# Patient Record
Sex: Male | Born: 1935 | Race: White | Hispanic: No | Marital: Married | Smoking: Never smoker
Health system: Southern US, Community
[De-identification: ages and names within clinical notes are randomized; demographics above are authoritative.]

## PROBLEM LIST (undated history)

## (undated) DIAGNOSIS — K659 Peritonitis, unspecified: Secondary | ICD-10-CM

## (undated) DIAGNOSIS — I5032 Chronic diastolic (congestive) heart failure: Secondary | ICD-10-CM

## (undated) DIAGNOSIS — M1990 Unspecified osteoarthritis, unspecified site: Secondary | ICD-10-CM

## (undated) DIAGNOSIS — Z950 Presence of cardiac pacemaker: Secondary | ICD-10-CM

## (undated) DIAGNOSIS — E7800 Pure hypercholesterolemia, unspecified: Secondary | ICD-10-CM

## (undated) DIAGNOSIS — G4733 Obstructive sleep apnea (adult) (pediatric): Secondary | ICD-10-CM

## (undated) DIAGNOSIS — I4510 Unspecified right bundle-branch block: Secondary | ICD-10-CM

## (undated) DIAGNOSIS — I8390 Asymptomatic varicose veins of unspecified lower extremity: Secondary | ICD-10-CM

## (undated) DIAGNOSIS — I519 Heart disease, unspecified: Secondary | ICD-10-CM

## (undated) DIAGNOSIS — B019 Varicella without complication: Secondary | ICD-10-CM

## (undated) DIAGNOSIS — E119 Type 2 diabetes mellitus without complications: Secondary | ICD-10-CM

## (undated) DIAGNOSIS — K635 Polyp of colon: Secondary | ICD-10-CM

## (undated) DIAGNOSIS — I350 Nonrheumatic aortic (valve) stenosis: Secondary | ICD-10-CM

## (undated) DIAGNOSIS — A770 Spotted fever due to Rickettsia rickettsii: Secondary | ICD-10-CM

## (undated) DIAGNOSIS — J189 Pneumonia, unspecified organism: Secondary | ICD-10-CM

## (undated) DIAGNOSIS — I10 Essential (primary) hypertension: Secondary | ICD-10-CM

## (undated) DIAGNOSIS — Z952 Presence of prosthetic heart valve: Secondary | ICD-10-CM

## (undated) DIAGNOSIS — E785 Hyperlipidemia, unspecified: Secondary | ICD-10-CM

## (undated) DIAGNOSIS — I4891 Unspecified atrial fibrillation: Secondary | ICD-10-CM

## (undated) DIAGNOSIS — E669 Obesity, unspecified: Secondary | ICD-10-CM

## (undated) DIAGNOSIS — I2510 Atherosclerotic heart disease of native coronary artery without angina pectoris: Secondary | ICD-10-CM

## (undated) DIAGNOSIS — R0600 Dyspnea, unspecified: Secondary | ICD-10-CM

## (undated) DIAGNOSIS — D649 Anemia, unspecified: Secondary | ICD-10-CM

## (undated) HISTORY — DX: Obstructive sleep apnea (adult) (pediatric): G47.33

## (undated) HISTORY — DX: Essential (primary) hypertension: I10

## (undated) HISTORY — DX: Anemia, unspecified: D64.9

## (undated) HISTORY — PX: APPENDECTOMY: SHX54

## (undated) HISTORY — PX: COLECTOMY: SHX59

## (undated) HISTORY — DX: Type 2 diabetes mellitus without complications: E11.9

## (undated) HISTORY — PX: EYE SURGERY: SHX253

## (undated) HISTORY — DX: Atherosclerotic heart disease of native coronary artery without angina pectoris: I25.10

## (undated) HISTORY — PX: KNEE CARTILAGE SURGERY: SHX688

## (undated) HISTORY — DX: Unspecified osteoarthritis, unspecified site: M19.90

## (undated) HISTORY — DX: Polyp of colon: K63.5

## (undated) HISTORY — PX: CARDIAC SURGERY: SHX584

## (undated) HISTORY — DX: Nonrheumatic aortic (valve) stenosis: I35.0

## (undated) HISTORY — PX: VEIN SURGERY: SHX48

## (undated) HISTORY — PX: CARPAL TUNNEL RELEASE: SHX101

## (undated) HISTORY — DX: Asymptomatic varicose veins of unspecified lower extremity: I83.90

## (undated) HISTORY — DX: Hyperlipidemia, unspecified: E78.5

## (undated) HISTORY — DX: Pure hypercholesterolemia, unspecified: E78.00

## (undated) HISTORY — DX: Heart disease, unspecified: I51.9

## (undated) HISTORY — DX: Varicella without complication: B01.9

## (undated) HISTORY — DX: Chronic diastolic (congestive) heart failure: I50.32

## (undated) HISTORY — DX: Obesity, unspecified: E66.9

## (undated) HISTORY — PX: PARTIAL COLECTOMY: SHX5273

## (undated) HISTORY — PX: JOINT REPLACEMENT: SHX530

## (undated) HISTORY — DX: Unspecified atrial fibrillation: I48.91

## (undated) HISTORY — PX: KNEE SURGERY: SHX244

## (undated) HISTORY — PX: PACEMAKER IMPLANT: EP1218

## (undated) HISTORY — DX: Unspecified right bundle-branch block: I45.10

---
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
## 1978-09-06 HISTORY — PX: VEIN SURGERY: SHX48

## 1983-09-07 HISTORY — PX: APPENDECTOMY: SHX54

## 2004-09-24 ENCOUNTER — Encounter (INDEPENDENT_AMBULATORY_CARE_PROVIDER_SITE_OTHER): Payer: Self-pay | Admitting: *Deleted

## 2004-09-24 ENCOUNTER — Ambulatory Visit (HOSPITAL_COMMUNITY): Admission: RE | Admit: 2004-09-24 | Discharge: 2004-09-24 | Payer: Self-pay | Admitting: *Deleted

## 2007-06-06 ENCOUNTER — Ambulatory Visit (HOSPITAL_COMMUNITY): Admission: RE | Admit: 2007-06-06 | Discharge: 2007-06-06 | Payer: Self-pay | Admitting: *Deleted

## 2007-06-06 ENCOUNTER — Encounter (INDEPENDENT_AMBULATORY_CARE_PROVIDER_SITE_OTHER): Payer: Self-pay | Admitting: *Deleted

## 2007-09-15 ENCOUNTER — Inpatient Hospital Stay (HOSPITAL_COMMUNITY): Admission: RE | Admit: 2007-09-15 | Discharge: 2007-09-19 | Payer: Self-pay | Admitting: General Surgery

## 2007-09-15 ENCOUNTER — Encounter (INDEPENDENT_AMBULATORY_CARE_PROVIDER_SITE_OTHER): Payer: Self-pay | Admitting: General Surgery

## 2008-01-30 ENCOUNTER — Ambulatory Visit: Payer: Self-pay | Admitting: Internal Medicine

## 2008-01-31 ENCOUNTER — Inpatient Hospital Stay (HOSPITAL_COMMUNITY): Admission: EM | Admit: 2008-01-31 | Discharge: 2008-02-04 | Payer: Self-pay | Admitting: Emergency Medicine

## 2008-03-19 ENCOUNTER — Ambulatory Visit (HOSPITAL_COMMUNITY): Admission: RE | Admit: 2008-03-19 | Discharge: 2008-03-19 | Payer: Self-pay | Admitting: *Deleted

## 2008-03-19 ENCOUNTER — Encounter (INDEPENDENT_AMBULATORY_CARE_PROVIDER_SITE_OTHER): Payer: Self-pay | Admitting: *Deleted

## 2011-01-19 NOTE — Op Note (Signed)
Todd Romero, Todd Romero           ACCOUNT NO.:  [id number]   MEDICAL RECORD NO.:  [id number]          PATIENT TYPE:  INP   LOCATION:  0003                         FACILITY:  [facility]   PHYSICIAN:  Edsel Petrin. Dalbert Batman, M.D.DATE OF BIRTH:  09/20/1933   DATE OF PROCEDURE:  09/15/2007  DATE OF DISCHARGE:                               OPERATIVE REPORT   PREOPERATIVE DIAGNOSIS:  Villous adenoma of the cecum.   POSTOPERATIVE DIAGNOSIS:  Villous adenoma of the cecum.   OPERATION:  1. Laparoscopic-assisted right colectomy.  2. Extensive, complex lysis of adhesions requiring 60 minutes.   SURGEON:  Edsel Petrin. Dalbert Batman, M.D.   FIRST ASSISTANT:  Kathrin Penner, M.D.   OPERATIVE INDICATIONS:  This is a 77 year old white man who has a  history of colon polyps in the past.  He had a surveillance colonoscopy  recently which revealed a moderately large, sessile adenomatous polyp  near the appendiceal orifice extending down into the cecal tip.  Biopsy  showed a tubular adenoma.  He had some a couple of other small polyps  which were completely removed.  He was counseled as an outpatient  regarding colon resection.  The laparoscopic approach was outlined.  He  does have a history of perforated appendicitis and 1989, requiring two  drains and a 21-day hospital stay.   OPERATIVE FINDINGS:  The patient had a palpable soft polyp in the cecum.  We were able to get good margins proximally in the terminal ileum and  distally up in the proximal ascending colon on this.  The excess  adhesions were fairly extensive including complex omental adhesions to  the anterior abdominal wall, complex adhesions of the omentum and small  bowel and colon to each other requiring over 60 minutes to take apart to  identify the anatomy.  Otherwise, the liver looked fine.  Peritoneal  surface looked fine.   OPERATIVE TECHNIQUE:  Following induction of general endotracheal  anesthesia, a Foley catheter was placed.  The patient  was given  intravenous antibiotics.  The patient was identified as the correct  patient and the correct procedure.  Marcaine 0.5% with epinephrine was  used as a local infiltration anesthetic.  A vertically oriented incision  was made at the superior rim of the umbilicus.  The fascia was incised  in midline.  The abdominal cavity entered under direct vision.  A 10 mm  Hassan trocar was inserted and secured with the pursestring suture of 0  Vicryl.  Pneumoperitoneum was created.  The video camera was inserted  with visualization findings as described above.   Adhesions came right up to the umbilicus.  I was able to put a 5-mm  trocar in the left abdominal wall, a 5 mm trocar in the lower midline, a  5-mm trocar in the right abdominal wall, and 11-mm trocar in the upper  midline.  Using the Harmonic scalpel, blunt dissection and scissors  carefully took down all the omental adhesions from the anterior  abdominal wall.  I then dissected the omentum off of the mid and  terminal ileum.  I then took down all the adhesions between loops of  ileum.  I ultimately was able to identify the ileocecal valve and the  cecum.  Using the Harmonic scalpel and blunt dissection, I mobilized the  right colon, hepatic flexure from lateral to medial.  I kept the  duodenum identified and visualized.  Once I felt that I had good  mobilization, I made a transverse incision in the right mid abdomen  lateral to the umbilicus.  The rectus sheath and rectus muscle were  divided with electrocautery.  Wound protector was placed.  We brought  the colon out through this.  We had to take down some more adhesions,  but ultimately we could identify the terminal ileum, the ileocecal  valve, the cecum, the ascending colon, and the hepatic flexure.  We  transected the terminal ileum about 2-1/2 inches proximal to 1/2 inch  proximal to the ileocecal valve using a GIA stapling device.  We  transected the hepatic flexure using a  GIA stapling device.  Mesenteric  vessels were divided using the LigaSure device firing this multiple  times.  A couple of vessels were controlled with 2-0 silk suture  ligatures.  The specimen was removed and sent to the lab.  Dr. Brigitte Pulse took  a look at this and said that I had excellent margins proximally and a 10  cm margin distally.  She said that she could clearly identify the polyp  in the cecum.   Anastomosis was created between the terminal ileum and hepatic flexure  using the GIA stapling device.  There was no bleeding from staple line.  The defect in the bowel wall was closed with TA-60 stapling device.  Several sutures of 3-0 silk were used to reinforce the staple line at  critical points and to control a couple of bleeding points in the staple  line.  At this point we checked the anastomosis.  It  looked very good.  There was no area of weakness or leak.  The mesentery was closed with  interrupted sutures of 2-0 silk.   We then changed our gloves, instruments and suction devices.  We  returned the intestinal tract to the abdominal cavity.  We irrigated the  abdominal cavity with about 2 liters of saline and evacuated the fluid.  The posterior rectus sheath was closed with running suture of #1 PDS.  The anterior rectus sheath was closed with running suture of #1 PDS and  skin closed with skin staples.   We reinsufflated the abdomen.  We reinserted the video camera.  The  anastomosis looked good.  There was no bleeding anywhere.  We irrigated  out all the fluid and there did not seem to be any bleeding.  Irrigation  fluid became clear.  We released the pneumoperitoneum.  All the trocars  were removed.  The fascia at the umbilicus was closed with 0 Vicryl  sutures.  All the skin incisions were closed with skin staples.  Kling  bandages were placed and the patient taken recovery in stable condition.  Estimated blood loss was about 125 mL.  Complications none.  Sponges  counts  were correct.      Edsel Petrin. Dalbert Batman, M.D.  Electronically Signed     HMI/MEDQ  D:  09/15/2007  T:  09/15/2007  Job:  [id number]   cc:   Waverly Ferrari, M.D.  [phone]   Ronaldo Miyamoto, M.D.  Fax: [phone]

## 2011-01-19 NOTE — Op Note (Signed)
NAME:  Todd Romero, Todd Romero NO.:  [id number]   MEDICAL RECORD NO.:  [id number]          PATIENT TYPE:  AMB   LOCATION:  ENDO                         FACILITY:  [facility]   PHYSICIAN:  Waverly Ferrari, M.D.    DATE OF BIRTH:  Jul 07, 1934   DATE OF PROCEDURE:  03/19/2008  DATE OF DISCHARGE:                               OPERATIVE REPORT   PROCEDURE:  Upper endoscopy with biopsy.   INDICATIONS:  Duodenal ulcer with bleeding.   ANESTHESIA:  1. Fentanyl 50 mcg.  2. Versed 5 mg.   PROCEDURE:  With the patient mildly sedated in the left lateral  decubitus position, the Pentax videoscopic endoscope was inserted in the  mouth and passed under direct vision through the esophagus, which  appeared normal, except I could not see the distal esophagus very well,  so Barrett's could not be ruled out.  We entered into the stomach.  Fundus, body, antrum appeared normal.  Duodenal bulb was clear of all  previously noted ulcers, as was the second portion of the duodenum.  From this point, the endoscope was slowly withdrawn, taking  circumferential views of the duodenal mucosa, until the endoscope had  been pulled back into the stomach and placed in retroflexion to view the  stomach from below, and we were able to biopsy this area of the distal  esophagus where there was possibly Barrett's.  The endoscope was then  straightened and withdrawn, taking circumferential views of the  remaining gastric and esophageal mucosa.  The patient's vital signs and  pulse oximeter remained stable.  The patient tolerated the procedure  well, without apparent complications.   FINDINGS:  Question of Barrett's esophagus, otherwise an unremarkable  exam.  Await biopsy report.  The patient will call me for results and  follow up with me as an outpatient.           ______________________________  Waverly Ferrari, M.D.     GMO/MEDQ  D:  03/19/2008  T:  03/19/2008  Job:  [id number]

## 2011-01-19 NOTE — Op Note (Signed)
NAME:  Todd Romero, Todd Romero NO.:  [id number]   MEDICAL RECORD NO.:  [id number]          PATIENT TYPE:  AMB   LOCATION:  ENDO                         FACILITY:  [facility]   PHYSICIAN:  Waverly Ferrari, M.D.    DATE OF BIRTH:  03-04-1934   DATE OF PROCEDURE:  06/06/2007  DATE OF DISCHARGE:                               OPERATIVE REPORT   PROCEDURE:  Colonoscopy with biopsy and polypectomy.   ENDOSCOPIST:  Waverly Ferrari, M.D.   INDICATIONS:  Colon polyps.   ANESTHESIA:  Fentanyl 75 mcg, Versed 7.5 mg.   PROCEDURE:  With the patient mildly sedated in the left lateral  decubitus position, a rectal examination was performed, which was  unremarkable.  Subsequently, the Pentax videoscopic colonoscope was  inserted into the rectum and passed under direct vision to the cecum,  identified by ileocecal valve and appendiceal orifice, both of which  were photographed.  Adjacent to the appendiceal orifice and in fact part  of the fold, there appeared to be an enlarged area that I thought was an  adenomatous tissue.  I felt at this point that it was probably too large  to remove endoscopically safely, so I elected to photograph it and take  multiple biopsies of the area.  From this point, the colonoscope was  then slowly withdrawn, taking circumferential views of colonic mucosa,  stopping next in the descending colon at approximately 60 cm from anal  verge, at which point a second polyp, much smaller, probably 3-4 mm in  size, was seen, photographed and removed using hot biopsy forceps  technique at a setting of 20/150 blended current.  We next stopped in  the rectum, which showed a polyp that was approximately 5 mm in size and  it was removed using snare cautery technique with the same setting.  Tissue was suctioned and obtained for Pathology.  The endoscope was  placed in a retroflexed view to view the anal canal from above.  The  endoscope was then straightened and withdrawn.  The  patient's vital  signs and pulse oximetry remained stable.  The patient tolerated the  procedure well without apparent complications.   FINDINGS:  Polyp of rectum at 60 cm from the anal verge and question of  polyp in the cecum, await biopsy reports.  The patient will call me for  results and follow up with me as an outpatient.           ______________________________  Waverly Ferrari, M.D.     GMO/MEDQ  D:  06/06/2007  T:  06/06/2007  Job:  [id number]

## 2011-01-19 NOTE — Discharge Summary (Signed)
NAME:  Todd Romero, Todd Romero           ACCOUNT NO.:  [id number]   MEDICAL RECORD NO.:  [id number]          PATIENT TYPE:  INP   LOCATION:  [place]                         FACILITY:  [facility]   PHYSICIAN:  [name], M.D.       DATE OF BIRTH:  03-Jun-1934   DATE OF ADMISSION:  01/30/2008  DATE OF DISCHARGE:  02/04/2008                               DISCHARGE SUMMARY   HISTORY:  The patient was admitted with fever for about 5 days with mild  chills, with temperature being persistent up about 101 degrees.  He did  not have any significant systemic symptoms but was getting more weak and  lethargic as well as, according to his wife, somewhat confused and  disoriented.   PHYSICAL EXAMINATION:  The patient had a blood pressure of 156/72,  temperature 101.8.  His mucous membranes were dry.  The rest of the exam  was unremarkable.   HOSPITAL COURSE:  The patient was seen by Dr. Megan Salon in consultation  for fever who felt that he had a faint, nonblanching macular rash on his  feet and just above his elbows and started him on doxycycline  empirically.  Subsequently, the [facility] spotted fever test came  back positive and he was continued on doxycycline.  The patient's  temperature peaked at about 103 degrees on May 27, but subsequently  started improving.  The patient's mental status also slowly was  improving.   On May 27 the patient had a bloody and tarry bowel movement and had a  near-syncopal episode with his blood pressure dropping to 96 systolic.  The patient was seen in consultation by Dr. Jim Desanctis who felt that he  had upper GI bleeding and he started him on Protonix infusion and  transfused him 2 units.   His endoscopy by Dr. Lajoyce Corners the next day showed a bleeding duodenal ulcer  which was actively bleeding.  This was not clearly seen because of the  blood overlying it and epinephrine 2 mL was injected in the base.  This  apparently stopped the bleeding at the time of endoscopy and the  patient  did not have any further melena or episodes of hypotension.   The patient had no significant fever on May 29 and May 30.  His white  blood cell count also came down to 10.5 after a peak level of about  15,000 on May 28.  The patient resumed his normal diet.  His blood  pressure medications were withheld.  Also, because of the patient's  marked hyperglycemia after the first day of admission, the patient was  given increasing doses of insulin with maximum of 44 Lantus a day which  was reduced on May 30 down to 24, with which his blood sugars remained  quite normal.  The patient's metformin was also withheld initially but  was resumed subsequently.   SIGNIFICANT LABORATORIES:  RMSF titer 1.37, normal less than 0.89.  His  lowest hemoglobin level was around 9.2.  Chemistry showed sodium of 127  on admission and was normal to 136 upon discharge.  CT scan of his brain  on admission  showed no acute findings.  Chest x-ray was also  unremarkable except mild bronchitic changes.   DISCHARGE DIAGNOSES:  1. [facility] spotted fever secondary to a tick bite.  2. Acute bleeding duodenal ulcer.  3. Hyponatremia.  4. Hyperglycemia secondary to diabetes.  5. Mild encephalopathy secondary to [facility] spotted fever.  6. History of hypertension, obesity, hyperlipidemia and colon      neoplasm.   DISCHARGE CONDITION:  Significantly improved.   DISCHARGE INSTRUCTIONS:  The patient will resume his diabetic diet.  He  will continue his home medications including Actos, metformin, Amaryl,  Lipitor, Enablex, Flomax, but will not take any Hyzaar.  He will also  not take any aspirin or Advil-type products.  He will continue to check  his blood pressure and blood sugar at home regularly and follow up in  the office in 1 week.  He will also be started on OTC iron twice a day.      [name], M.D.  Electronically Signed     AK/MEDQ  D:  02/04/2008  T:  02/04/2008  Job:  [id number]    cc:   Waverly Ferrari, M.D.  Fax: [phone]   Michel Bickers, M.D.  Fax: [phone]

## 2011-01-19 NOTE — H&P (Signed)
NAME:  Todd Romero, Todd Romero           ACCOUNT NO.:  [id number]   MEDICAL RECORD NO.:  [id number]          PATIENT TYPE:  OBV   LOCATION:  0104                         FACILITY:  [facility]   PHYSICIAN:  [name], M.D.       DATE OF BIRTH:  12/06/1933   DATE OF ADMISSION:  01/30/2008  DATE OF DISCHARGE:                              HISTORY & PHYSICAL   CHIEF COMPLAINT:  Fever.   HISTORY:  This 77 year old Caucasian male has had fever since about last  Thursday.  The patient does not have any associated chills, but the  temperature has been about 101 or so at home.  He was evaluated in the  [facility] on Saturday without any etiology being found.  The patient  has also been somewhat disoriented and less responsive in the last 3  days.  There is a history of mild cough, but the patient does not  complain of this right now.  There are no significant headaches, sore  throat, no hemoptysis.  There is no history of abdominal pain.  No  change in bowel movements.  He does not think he had any burning or  discomfort with urination, although he has some urgency.  He apparently  has been getting much more weak in the last few days and requires help  with getting up and down.  He is also having markedly decreased appetite  without any nausea or vomiting.   CURRENT MEDICATIONS:  1. He is on insulin 20 units h.s.  2. Amaryl 2 mg.  3. Actos 45.  4. Metformin 1 gm b.i.d.  5. Hyzaar 100/25.  6. Lipitor 40 mg.  7. Enablex 15 mg.  8. Flomax 0.4 mg.  9. Fish oil.   ALLERGIES:  NONE.   PAST HISTORY:  1. Hemicolectomy for precancerous polyps.  2. Varicose vein surgery.   FAMILY HISTORY:  Noncontributory.   PERSONAL HISTORY:  Nonsmoker.   REVIEW OF SYSTEMS:  He has had diabetes since 1997 which is fairly well  controlled and his last A1c of 6.7%. He has had hypertension for about 2  years.  He has a history of BPH and lower urinary tract symptoms  followed by urologist.  He has had regular  followups for his GI  monitoring.   PHYSICAL EXAMINATION:  VITAL SIGNS:  Pulse is 70, blood pressure 156/72,  temperature 101.8, respirations normal.  HEENT:  He has equal pupils and normal ocular movements. Mucous  membranes are dry.  NECK:  Shows no lymphadenopathy.  No thyroid enlargement.  He has no  neck stiffness.  HEART:  Sounds are normal without murmurs.  LUNGS:  Clear.  ABDOMEN:  No distention or tenderness.  RECTAL:  Showed no tenderness of the prostate.  EXTREMITIES:  Show no foot infection nor toe ulcers.  SKIN:  Shows no rash   ASSESSMENT:  The patient has fever of unknown origin with mild  leukocytosis of 12,000.  He also got some encephalopathy developing.  Currently, he also has a mild decrease in sodium which could be  responsible for his mental status change.   PLAN:  Would be  to get ID consult and give supportive treatment and  hydration.  His [facility] spotted fever titer has also been sent  and pending.      [name], M.D.  Electronically Signed     AK/MEDQ  D:  01/30/2008  T:  01/30/2008  Job:  [id number]

## 2011-01-19 NOTE — Op Note (Signed)
NAME:  Todd Romero, Todd Romero NO.:  [id number]   MEDICAL RECORD NO.:  [id number]          PATIENT TYPE:  INP   LOCATION:  1410                         [facility]ACILITY:  [facility]   PHYSICIAN:  Waverly Ferrari, M.D.    DATE OF BIRTH:  1933/12/15   DATE OF PROCEDURE:  DATE OF DISCHARGE:                               OPERATIVE REPORT   PROCEDURE:  Upper endoscopy with injection of bleeding ulcer.   ANESTHESIA:  Fentanyl 50 mcg, Versed 5 mg.   INDICATIONS:  The patient is passing black stools with blood per rectum  that started on Jan 31, 2008.   DESCRIPTION OF PROCEDURE:  With the patient mildly sedated in the left  lateral decubitus position in room 2 of Endoscopy at [facility], the Pentax videoscopic endoscope was inserted in the mouth and  passed under direct vision through the esophagus, which showed some  superficial ulcerations in the esophagus, presumably secondary to acid  reflux.  We were able to advance past it, but there was no active  bleeding seen from this site.  We advanced past this into the stomach  and bright red blood was seen in the fundus of the stomach, pooling.  We  advanced distally to this.  The body and antrum appeared normal, but we  could see blood emanating back from the duodenum.  We advanced into the  duodenum and there was a fairly copious amount of bright red blood in  the duodenum and distally, but without as far as an active bleeding site  visible.  Although we could see blood spurting underneath the pool of  blood, we could not find the lesion.  We suctioned, washed and suctioned  subsequently and there appeared to be a temporary cessation of bleeding,  at which point on slow pull back from distal to proximal at the apex of  the duodenal bulb, I found a deepened area which I presume was an ulcer.  I could not see the base because there was a blood clot overlying it,  and I elected then to inject 2 mL of epinephrine into the base of the  ulcer and noted good blanching surrounding this area.  I then pulled  back slightly further and injected 1 mL proximal to this ulcer with  further blanching.  Once I had been satisfied that the bleeding seemed  to have stopped, I withdrew the endoscope, taking circumferential views  of the remaining gastric and esophageal mucosa.  The patient's vital  signs and pulse oximeter remained stable.  The patient tolerated the  procedure well without apparent complications.   FINDINGS:  1. Acid reflux ulcerations of the esophagus, not a bleeding site.  2. Deep ulcer at the apex of the duodenal bulb appeared to be active      bleeding site, injected with epinephrine.   PLAN:  Continue to monitor the patient and transfuse as necessary.  Continue Protonix 8 mg IV continuous infusion.  Check an H. pylori  antibody.           ______________________________  Waverly Ferrari, M.D.     GMO/MEDQ  D:  02/01/2008  T:  02/01/2008  Job:  [id number]   cc:   [name], M.D.  Fax: [phone]   Parke Poisson. Electa Sniff, M.D.  Fax: [phone]

## 2011-01-22 NOTE — Op Note (Signed)
NAME:  Todd Romero, CESAR NO.:  [id number]   MEDICAL RECORD NO.:  [id number]          PATIENT TYPE:  AMB   LOCATION:  ENDO                         FACILITY:  [place]   PHYSICIAN:  Waverly Ferrari, M.D.    DATE OF BIRTH:  September 29, 1933   DATE OF PROCEDURE:  09/24/2004  DATE OF DISCHARGE:                                 OPERATIVE REPORT   PROCEDURE:  Upper endoscopy.   INDICATIONS:  __________ positivity.   ANESTHESIA:  1.  Demerol 40.  2. Versed 6 mg.   PROCEDURE:  With the patient mildly sedated in the left lateral decubitus  position, the Olympus videoscopic endoscope was inserted in the mouth,  advanced under direct vision to the esophagus which appeared normal until we  reached the distal esophagus.  There was ________ linear erosion ________  palpable reflux which was at first photographed.  The stomach, fundus was  reviewed and photographed.  We pulled back his distal esophagus.  There  appeared to be a change of Barrett's, esophagus photographed and biopsied.  We then advanced into the stomach.  The fundus, body, antrum, duodenal bulb,  second portion of the duodenum all appeared normal throughout.  At this  point the endoscope was slowly withdrawn, taking circumferential views of  the duodenum.  With no difficulty, the endoscope was then pulled back in the  stomach __________ retroflexion from below.  The endoscope was straightened  and withdrawn, taking circumferential views of the remaining gastric and  esophageal mucosa _________ biopsy the area of linear erosion _________  patient's vital signs were stable.  Patient tolerated the procedure well  without any apparent complications.   FINDINGS:  Question of Barrett's esophagus and mild esophagitis, biopsied;  await biopsy report.  Patient to call for results and followup with me as an  outpatient and proceed with colonoscopy as planned.      GMO/MEDQ  D:  09/24/2004  T:  09/24/2004  Job:  [id number]

## 2011-01-22 NOTE — Discharge Summary (Signed)
[name], Todd Romero           ACCOUNT NO.:  [id number]   MEDICAL RECORD NO.:  [id number]          PATIENT TYPE:  INP   LOCATION:  [place]:  [place]:  Edsel Petrin. Dalbert Batman, M.D.DATE OF BIRTH:  [birth year]   DATE OF ADMISSION:  09/15/2007  DATE OF DISCHARGE:  09/19/2007                               DISCHARGE SUMMARY   FINAL DIAGNOSIS:  1. Villous adenoma of the cecum.  2. diabetes mellitus.  3. Hypertension.  4. Hyperlipidemia.  5. Obesity.  6. Complex intra-abdominal adhesions related to past history of      ruptured appendicitis.   OPERATION PERFORMED:  1. Laparoscopic-assisted right colectomy.  2. Laparoscopic lysis of adhesions   This is a 77 year old white man who has history of colon polyps.  Recent  surveillance colonoscopy revealed a sessile, poorly differentiated  adenomatous polyp near the appendiceal orifice in the cecum which was  too large to remove endoscopically.  The biopsy showed tubular adenoma.  He had some other smaller polyps which were completely removed.  He was  referred for segmental colon resection because of his unresectable  villous adenoma of the cecum.  He was counseled as an outpatient.  He  underwent bowel prep at home and was brought to the operating room  electively.   HOSPITAL COURSE:  On day of admission, the patient underwent  laparoscopic-assisted right colectomy.  He had fairly complex adhesions  which took over 1 hour to take down.  We then ultimately were able to  accomplish the laparoscopic-assisted right colectomy.  Final pathology  report showed tubulovillous adenoma and three benign lymph nodes and no  invasive malignancy identified.   Postoperatively, the patient did well.  We started his Flomax and took  his Foley out on January 11 and he voided uneventfully thereafter.  His  diabetes was controlled with sliding scale insulin and as he resumed  diet, we restarted his Actos and Amaryl and  metformin as the diet  advanced.  His hypertension was well-controlled on his medications.  He  advanced his diet without too much trouble.  He began having bowel  movements on January 12 and we advanced his diet and he continued to  have bowel movements and was ready to go home on January 13.  At time,  he was tolerating regular diet, having bowel movements.  His wounds  looked good, blood sugars were under control, blood pressure was under  control and he felt well.   DISCHARGE MEDICATIONS:  Include his usual medications and Vicodin ES for  pain.  He was asked to return see me in the office in 5 days for staple  removal.      Edsel Petrin. Dalbert Batman, M.D.  Electronically Signed     HMI/MEDQ  D:  09/29/2007  T:  09/29/2007  Job:  [id number]   cc:   Waverly Ferrari, M.D.  [phone]   Ronaldo Miyamoto, M.D.  Fax: [phone]

## 2011-01-22 NOTE — Op Note (Signed)
NAME:  RODIN, PULLIUM NO.:  [id number]   MEDICAL RECORD NO.:  [id number]          PATIENT TYPE:  AMB   LOCATION:  ENDO                         FACILITY:  [facility]   PHYSICIAN:  Waverly Ferrari, M.D.    DATE OF BIRTH:  1933-10-25   DATE OF PROCEDURE:  09/24/2004  DATE OF DISCHARGE:                                 OPERATIVE REPORT   PROCEDURE:  Colonoscopy.   INDICATIONS:  Colon polyps __________.   ANESTHESIA:  1.  Demerol 20.  2. Versed 1 mg.   PROCEDURE:  With the patient mildly sedated in the left lateral decubitus  position, a rectal examination was performed which was unremarkable  __________.  The Olympus videoscopic colonoscope was inserted in the rectum  and passed under direct vision, with pressure applied over the abdomen to  reach the cecum, identified by the ileocecal valve and appendiceal orifice.  Near the appendiceal orifice was a polyp that was photographed.  Using the  snare cautery technique, __________ there was a second polyp here, this was  ablated, was very tiny.  Using hot biopsy forceps at the same setting, we  next removed a polyp by hot biopsy forceps in the other area of the cecum.  The endoscope was then withdrawn, taking circumferential views of the  remaining colonic mucosa, stopping only in the rectum which appeared normal.  The rectum showed small hemorrhoids on retroflexed view.  The scope was  straightened and withdrawn.  The patient's vital signs and pulse oximetry  remained in stable condition.  He tolerated the procedure well without  apparent complications.   FINDINGS:  Polyps in cecum as described.  Await biopsy report.  Patient will  call me for results and followup with me as an outpatient.      GMO/MEDQ  D:  09/24/2004  T:  09/24/2004  Job:  [id number]

## 2011-05-27 LAB — BASIC METABOLIC PANEL
BUN: 7
CO2: 24
GFR calc non Af Amer: >60
GFR calc non Af Amer: >60
Glucose, Bld: 125 — ABNORMAL HIGH
Glucose, Bld: 164 — ABNORMAL HIGH
Potassium: 3.5
Potassium: 3.8
Sodium: 129 — ABNORMAL LOW

## 2011-05-27 LAB — CBC
HCT: 33.6 — ABNORMAL LOW
HCT: 38.2 — ABNORMAL LOW
Hemoglobin: 11.5 — ABNORMAL LOW
MCHC: 34.2
Platelets: 197
RBC: 4.2 — ABNORMAL LOW
RDW: 14.9
WBC: 7.7

## 2011-05-27 LAB — COMPREHENSIVE METABOLIC PANEL
AST: 20
Albumin: 3.8
Alkaline Phosphatase: 54
BUN: 21
CO2: 25
Chloride: 105
GFR calc Af Amer: >60
Potassium: 4.1
Total Bilirubin: 0.8

## 2011-05-27 LAB — URINALYSIS, ROUTINE W REFLEX MICROSCOPIC
Bilirubin Urine: NEGATIVE
Glucose, UA: NEGATIVE
Hgb urine dipstick: NEGATIVE
Ketones, ur: NEGATIVE
pH: 6.5

## 2011-05-27 LAB — DIFFERENTIAL
Basophils Absolute: 0
Basophils Relative: 0
Eosinophils Relative: 2
Monocytes Absolute: 0.8

## 2011-06-02 LAB — DIFFERENTIAL
Basophils Absolute: 0
Basophils Absolute: 0
Basophils Absolute: 0.1
Basophils Relative: 0
Basophils Relative: 0
Basophils Relative: 1
Eosinophils Absolute: 0
Eosinophils Absolute: 0
Eosinophils Absolute: 0.2
Eosinophils Absolute: 0.3
Eosinophils Relative: 0
Eosinophils Relative: 0
Lymphocytes Relative: 14
Lymphocytes Relative: 20
Lymphocytes Relative: 9 — ABNORMAL LOW
Lymphs Abs: 2.7
Monocytes Absolute: 1
Monocytes Relative: 8
Monocytes Relative: 9
Neutro Abs: 9.4 — ABNORMAL HIGH
Neutrophils Relative %: 70

## 2011-06-02 LAB — COMPREHENSIVE METABOLIC PANEL
ALT: 27
ALT: 29
AST: 38 — ABNORMAL HIGH
Albumin: 2.2 — ABNORMAL LOW
Albumin: 2.2 — ABNORMAL LOW
Alkaline Phosphatase: 36 — ABNORMAL LOW
Alkaline Phosphatase: 40
BUN: 12
BUN: 23
BUN: 23
CO2: 24
CO2: 25
Calcium: 8.2 — ABNORMAL LOW
Calcium: 8.5
Calcium: 8.5
Chloride: 104
Creatinine, Ser: 0.82
Creatinine, Ser: 0.96
GFR calc Af Amer: >60
GFR calc non Af Amer: >60
GFR calc non Af Amer: >60
Glucose, Bld: 177 — ABNORMAL HIGH
Glucose, Bld: 76
Potassium: 3.7
Potassium: 3.9
Sodium: 133 — ABNORMAL LOW
Sodium: 134 — ABNORMAL LOW
Sodium: 134 — ABNORMAL LOW
Total Bilirubin: 0.6
Total Bilirubin: 0.6
Total Protein: 4.5 — ABNORMAL LOW
Total Protein: 4.6 — ABNORMAL LOW
Total Protein: 4.7 — ABNORMAL LOW

## 2011-06-02 LAB — URINALYSIS, ROUTINE W REFLEX MICROSCOPIC
Ketones, ur: 15 — AB
Leukocytes, UA: NEGATIVE
Nitrite: NEGATIVE
Protein, ur: 30 — AB
Urobilinogen, UA: 1

## 2011-06-02 LAB — CBC
HCT: 27.2 — ABNORMAL LOW
HCT: 27.7 — ABNORMAL LOW
HCT: 28.3 — ABNORMAL LOW
HCT: 30.2 — ABNORMAL LOW
HCT: 31.3 — ABNORMAL LOW
HCT: 33 — ABNORMAL LOW
Hemoglobin: 9.5 — ABNORMAL LOW
Hemoglobin: 9.6 — ABNORMAL LOW
MCHC: 34.3
MCHC: 34.6
MCHC: 34.6
MCHC: 34.9
MCV: 87.6
MCV: 88
MCV: 88.2
Platelets: 140 — ABNORMAL LOW
Platelets: 154
Platelets: 163
Platelets: 166
Platelets: 198
RBC: 3.14 — ABNORMAL LOW
RBC: 3.55 — ABNORMAL LOW
RDW: 13.5
RDW: 13.7
RDW: 14.2
RDW: 14.4
RDW: 14.5
RDW: 14.7
WBC: 12.5 — ABNORMAL HIGH

## 2011-06-02 LAB — POCT I-STAT, CHEM 8
BUN: 19
Calcium, Ion: 1.14
Chloride: 92 — ABNORMAL LOW
Glucose, Bld: 212 — ABNORMAL HIGH

## 2011-06-02 LAB — BASIC METABOLIC PANEL
BUN: 12
Calcium: 8.5
Creatinine, Ser: 0.83
GFR calc non Af Amer: >60
Glucose, Bld: 86

## 2011-06-02 LAB — CULTURE, BLOOD (ROUTINE X 2): Culture: NO GROWTH

## 2011-06-02 LAB — CROSSMATCH: ABO/RH(D): A POS

## 2011-06-02 LAB — PROTIME-INR
INR: 1.2
Prothrombin Time: 15.2

## 2011-06-02 LAB — HEMOGLOBIN AND HEMATOCRIT, BLOOD
HCT: 28.6 — ABNORMAL LOW
HCT: 28.9 — ABNORMAL LOW
Hemoglobin: 9.5 — ABNORMAL LOW
Hemoglobin: 9.7 — ABNORMAL LOW
Hemoglobin: 9.9 — ABNORMAL LOW

## 2011-06-02 LAB — H. PYLORI ANTIBODY, IGG: H Pylori IgG: <0.4

## 2011-11-16 DIAGNOSIS — H43819 Vitreous degeneration, unspecified eye: Secondary | ICD-10-CM | POA: Diagnosis not present

## 2011-11-16 DIAGNOSIS — H33312 Horseshoe tear of retina without detachment, left eye: Secondary | ICD-10-CM | POA: Insufficient documentation

## 2011-11-16 DIAGNOSIS — H33009 Unspecified retinal detachment with retinal break, unspecified eye: Secondary | ICD-10-CM | POA: Diagnosis not present

## 2011-12-14 DIAGNOSIS — D649 Anemia, unspecified: Secondary | ICD-10-CM | POA: Diagnosis not present

## 2011-12-14 DIAGNOSIS — IMO0001 Reserved for inherently not codable concepts without codable children: Secondary | ICD-10-CM | POA: Diagnosis not present

## 2011-12-14 DIAGNOSIS — E7800 Pure hypercholesterolemia, unspecified: Secondary | ICD-10-CM | POA: Diagnosis not present

## 2011-12-20 DIAGNOSIS — D649 Anemia, unspecified: Secondary | ICD-10-CM | POA: Diagnosis not present

## 2011-12-20 DIAGNOSIS — I10 Essential (primary) hypertension: Secondary | ICD-10-CM | POA: Diagnosis not present

## 2011-12-20 DIAGNOSIS — E785 Hyperlipidemia, unspecified: Secondary | ICD-10-CM | POA: Diagnosis not present

## 2012-01-07 DIAGNOSIS — H35039 Hypertensive retinopathy, unspecified eye: Secondary | ICD-10-CM | POA: Diagnosis not present

## 2012-01-07 DIAGNOSIS — H524 Presbyopia: Secondary | ICD-10-CM | POA: Diagnosis not present

## 2012-01-07 DIAGNOSIS — H353190 Nonexudative age-related macular degeneration, unspecified eye, stage unspecified: Secondary | ICD-10-CM | POA: Diagnosis not present

## 2012-01-07 DIAGNOSIS — H40019 Open angle with borderline findings, low risk, unspecified eye: Secondary | ICD-10-CM | POA: Diagnosis not present

## 2012-01-07 DIAGNOSIS — D492 Neoplasm of unspecified behavior of bone, soft tissue, and skin: Secondary | ICD-10-CM | POA: Diagnosis not present

## 2012-02-24 DIAGNOSIS — Z86010 Personal history of colonic polyps: Secondary | ICD-10-CM | POA: Diagnosis not present

## 2012-02-24 DIAGNOSIS — K219 Gastro-esophageal reflux disease without esophagitis: Secondary | ICD-10-CM | POA: Diagnosis not present

## 2012-02-24 DIAGNOSIS — Z1211 Encounter for screening for malignant neoplasm of colon: Secondary | ICD-10-CM | POA: Diagnosis not present

## 2012-02-24 DIAGNOSIS — D509 Iron deficiency anemia, unspecified: Secondary | ICD-10-CM | POA: Diagnosis not present

## 2012-03-02 DIAGNOSIS — H2510 Age-related nuclear cataract, unspecified eye: Secondary | ICD-10-CM | POA: Diagnosis not present

## 2012-03-02 DIAGNOSIS — H43399 Other vitreous opacities, unspecified eye: Secondary | ICD-10-CM | POA: Diagnosis not present

## 2012-03-02 DIAGNOSIS — H35039 Hypertensive retinopathy, unspecified eye: Secondary | ICD-10-CM | POA: Diagnosis not present

## 2012-03-02 DIAGNOSIS — H40019 Open angle with borderline findings, low risk, unspecified eye: Secondary | ICD-10-CM | POA: Diagnosis not present

## 2012-03-02 DIAGNOSIS — H353190 Nonexudative age-related macular degeneration, unspecified eye, stage unspecified: Secondary | ICD-10-CM | POA: Diagnosis not present

## 2012-03-07 DIAGNOSIS — M79609 Pain in unspecified limb: Secondary | ICD-10-CM | POA: Diagnosis not present

## 2012-03-07 DIAGNOSIS — M25539 Pain in unspecified wrist: Secondary | ICD-10-CM | POA: Diagnosis not present

## 2012-03-23 DIAGNOSIS — D649 Anemia, unspecified: Secondary | ICD-10-CM | POA: Diagnosis not present

## 2012-03-23 DIAGNOSIS — E785 Hyperlipidemia, unspecified: Secondary | ICD-10-CM | POA: Diagnosis not present

## 2012-03-27 DIAGNOSIS — Z0000 Encounter for general adult medical examination without abnormal findings: Secondary | ICD-10-CM | POA: Diagnosis not present

## 2012-03-27 DIAGNOSIS — E119 Type 2 diabetes mellitus without complications: Secondary | ICD-10-CM | POA: Diagnosis not present

## 2012-03-27 DIAGNOSIS — M2550 Pain in unspecified joint: Secondary | ICD-10-CM | POA: Diagnosis not present

## 2012-03-27 DIAGNOSIS — I10 Essential (primary) hypertension: Secondary | ICD-10-CM | POA: Diagnosis not present

## 2012-03-27 DIAGNOSIS — E7800 Pure hypercholesterolemia, unspecified: Secondary | ICD-10-CM | POA: Diagnosis not present

## 2012-03-27 DIAGNOSIS — M779 Enthesopathy, unspecified: Secondary | ICD-10-CM | POA: Diagnosis not present

## 2012-03-27 DIAGNOSIS — N318 Other neuromuscular dysfunction of bladder: Secondary | ICD-10-CM | POA: Diagnosis not present

## 2012-03-27 DIAGNOSIS — D649 Anemia, unspecified: Secondary | ICD-10-CM | POA: Diagnosis not present

## 2012-04-10 DIAGNOSIS — M654 Radial styloid tenosynovitis [de Quervain]: Secondary | ICD-10-CM | POA: Diagnosis not present

## 2012-04-24 DIAGNOSIS — M654 Radial styloid tenosynovitis [de Quervain]: Secondary | ICD-10-CM | POA: Diagnosis not present

## 2012-05-03 DIAGNOSIS — E785 Hyperlipidemia, unspecified: Secondary | ICD-10-CM | POA: Diagnosis not present

## 2012-05-03 DIAGNOSIS — I4510 Unspecified right bundle-branch block: Secondary | ICD-10-CM | POA: Diagnosis not present

## 2012-05-03 DIAGNOSIS — I10 Essential (primary) hypertension: Secondary | ICD-10-CM | POA: Diagnosis not present

## 2012-05-09 DIAGNOSIS — I4510 Unspecified right bundle-branch block: Secondary | ICD-10-CM | POA: Diagnosis not present

## 2012-05-09 DIAGNOSIS — I10 Essential (primary) hypertension: Secondary | ICD-10-CM | POA: Diagnosis not present

## 2012-05-09 DIAGNOSIS — E785 Hyperlipidemia, unspecified: Secondary | ICD-10-CM | POA: Diagnosis not present

## 2012-05-15 DIAGNOSIS — I10 Essential (primary) hypertension: Secondary | ICD-10-CM | POA: Diagnosis not present

## 2012-05-15 DIAGNOSIS — I4949 Other premature depolarization: Secondary | ICD-10-CM | POA: Diagnosis not present

## 2012-05-15 DIAGNOSIS — M654 Radial styloid tenosynovitis [de Quervain]: Secondary | ICD-10-CM | POA: Diagnosis not present

## 2012-05-15 DIAGNOSIS — R9439 Abnormal result of other cardiovascular function study: Secondary | ICD-10-CM | POA: Diagnosis not present

## 2012-05-15 DIAGNOSIS — E785 Hyperlipidemia, unspecified: Secondary | ICD-10-CM | POA: Diagnosis not present

## 2012-05-15 DIAGNOSIS — I4510 Unspecified right bundle-branch block: Secondary | ICD-10-CM | POA: Diagnosis not present

## 2012-05-15 DIAGNOSIS — I2510 Atherosclerotic heart disease of native coronary artery without angina pectoris: Secondary | ICD-10-CM | POA: Diagnosis not present

## 2012-05-17 DIAGNOSIS — Z1211 Encounter for screening for malignant neoplasm of colon: Secondary | ICD-10-CM | POA: Diagnosis not present

## 2012-05-17 DIAGNOSIS — Z86010 Personal history of colonic polyps: Secondary | ICD-10-CM | POA: Diagnosis not present

## 2012-06-12 DIAGNOSIS — I4949 Other premature depolarization: Secondary | ICD-10-CM | POA: Diagnosis not present

## 2012-06-12 DIAGNOSIS — I4510 Unspecified right bundle-branch block: Secondary | ICD-10-CM | POA: Diagnosis not present

## 2012-06-12 DIAGNOSIS — I2510 Atherosclerotic heart disease of native coronary artery without angina pectoris: Secondary | ICD-10-CM | POA: Diagnosis not present

## 2012-06-12 DIAGNOSIS — R9430 Abnormal result of cardiovascular function study, unspecified: Secondary | ICD-10-CM | POA: Diagnosis not present

## 2012-06-13 ENCOUNTER — Other Ambulatory Visit: Payer: Self-pay | Admitting: Interventional Cardiology

## 2012-06-15 ENCOUNTER — Inpatient Hospital Stay (HOSPITAL_BASED_OUTPATIENT_CLINIC_OR_DEPARTMENT_OTHER)
Admission: RE | Admit: 2012-06-15 | Discharge: 2012-06-15 | Disposition: A | Discharge status: Home / Self Care | Payer: Medicare Other | Source: Ambulatory Visit | Attending: Interventional Cardiology | Admitting: Interventional Cardiology

## 2012-06-15 ENCOUNTER — Encounter (HOSPITAL_BASED_OUTPATIENT_CLINIC_OR_DEPARTMENT_OTHER)
Admission: RE | Disposition: A | Discharge status: Home / Self Care | Payer: Self-pay | Source: Ambulatory Visit | Attending: Interventional Cardiology

## 2012-06-15 DIAGNOSIS — R9430 Abnormal result of cardiovascular function study, unspecified: Secondary | ICD-10-CM | POA: Diagnosis not present

## 2012-06-15 DIAGNOSIS — R079 Chest pain, unspecified: Secondary | ICD-10-CM | POA: Diagnosis not present

## 2012-06-15 DIAGNOSIS — I2582 Chronic total occlusion of coronary artery: Secondary | ICD-10-CM | POA: Diagnosis not present

## 2012-06-15 DIAGNOSIS — I2510 Atherosclerotic heart disease of native coronary artery without angina pectoris: Secondary | ICD-10-CM | POA: Diagnosis not present

## 2012-06-15 SURGERY — JV LEFT HEART CATHETERIZATION WITH CORONARY ANGIOGRAM
Anesthesia: Moderate Sedation

## 2012-06-15 MED ORDER — SODIUM CHLORIDE 0.9 % IV SOLN
INTRAVENOUS | Status: DC
  Administered 2012-06-15: 08:00:00 via INTRAVENOUS

## 2012-06-15 MED ORDER — SODIUM CHLORIDE 0.9 % IV SOLN: INTRAVENOUS | Status: DC

## 2012-06-15 MED ORDER — ONDANSETRON HCL 4 MG/2ML IJ SOLN: 4.0000 mg | Freq: Four times a day (QID) | INTRAMUSCULAR | Status: DC | PRN

## 2012-06-15 MED ORDER — ACETAMINOPHEN 325 MG PO TABS: 650.0000 mg | ORAL_TABLET | ORAL | Status: DC | PRN

## 2012-06-15 NOTE — Progress Notes (Signed)
Dr. Tamala Julian in to discuss results with patient and wife.

## 2012-06-15 NOTE — Progress Notes (Signed)
TR band removed, site level 0.  Tegaderm dressing applied and right wrist immobilizer.  Discharged to home.

## 2012-06-15 NOTE — Consult Note (Signed)
   Todd Romero is an 78 y.o. male.  HPI:  Abnormal nuclear study. See H & P.    Allergies:  Allergies  Allergen Reactions  . Morphine And Related Nausea And Vomiting    Medications: I have reviewed the patient's current medications.  Review of Systems  Respiratory: Positive for shortness of breath.        DOE  All other systems reviewed and are negative.   Blood pressure 153/73, pulse 41, resp. rate 16, height 5' 7.75" (1.721 m), weight 105.235 kg (232 lb), SpO2 98.00%.  Physical Exam: see exam in previously done H&P  Assessment/Plan: 1. Suspected CAD based upon abnormal nuclear study  Sinclair Grooms 06/15/2012, 8:56 AM

## 2012-06-15 NOTE — CV Procedure (Signed)
     Diagnostic Cardiac Catheterization Report  Todd Romero  78 y.o.  male June 21, 1934  Procedure Date: 06/15/2012 Referring Physician: A. Dwyane Dee, M.D. Primary Cardiologist:: HWB Smith,III, M.D.   PROCEDURE:  Left heart catheterization with selective coronary angiography, left ventriculogram via right radial approach  INDICATIONS:  Abnormal nuclear study suggesting anterior wall ischemia in a diabetic with multiple other risk factors for CAD .  The risks, benefits, and details of the procedure were explained to the patient.  The patient verbalized understanding and wanted to proceed.  Informed written consent was obtained.  PROCEDURE TECHNIQUE:  After Xylocaine anesthesia a 5 French sheath was placed in the right radial artery with a single anterior needle wall stick.   Coronary angiography was done using a 5 French 4 cm JR and 3.5 cm JL diagnostic catheter.  Left ventriculography was done using a 5 French 4 cm JR catheter via hand injection.    CONTRAST:  Total of 100 cc.  COMPLICATIONS:  None.    HEMODYNAMICS:  Aortic pressure was  115/54 mmHg; LV pressure was 116/11 mmHg; LVEDP 13 mm mercury.  There was no gradient between the left ventricle and aorta.    ANGIOGRAPHIC DATA:   The left main coronary artery is short, calcified, and widely patent..  The left anterior descending artery is heavily calcified in the proximal and mid segment. The LAD is transapical. The first diagonal is totally occluded in the midsegment. The ostial portion of the diagonal contains a segmental 50% stenosis. ..  The left circumflex artery is dominant. Proximal luminal irregularities are noted. Up to 30% narrowing is noted proximally. 3 obtuse marginal branches and the PDA arises from the circumflex. The PDA contains a focal 95% stenosis within a diffusely diseased segment. For the distal and the PDA there is 50% narrowing.  The right coronary artery is nondominant with diffuse disease.[name]  LEFT  VENTRICULOGRAM:  Left ventricular angiogram was done in the 30 RAO projection and revealed mild mid anterior wall hypokinesis and low normal systolic function with an estimated ejection fraction of 50%.    IMPRESSIONS:  1. Total occlusion of the first diagonal. 90% stenosis of the left circumflex PDA.  2. Nondominant diffusely diseased right coronary  3. Low normal left ventricular function with EF of 50% and mild anterior wall hypokinesis.   RECOMMENDATION:  We will discuss with the patient. Likely management strategy will be medical therapy. The distal circumflex/PDA could be intervened upon although the patient is asymptomatic and no obvious ischemia was documented on nuclear scintigraphy.[name]

## 2012-06-15 NOTE — H&P (Signed)
  There've been no interval changes in the patient's clinical status since the decision to perform coronary angiography was made. The basis for the procedure is a history of chest discomfort that prompted an ischemic evaluation. The evaluation included a nuclear study which demonstrated mid to distal anterior wall ischemia. The patient has an abnormal EKG which includes a right bundle branch block and frequent PVCs. He is diabetic and hyperlipidemic. The studies being done to define coronary anatomy and help guide therapy.  The procedure and its risks when done from the radial approach were discussed in detail with the patient this morning. The risks include limb ischemia, stroke, death, myocardial infarction, allergy, renal impairment from contrast, bleeding, and others. These were discussed with the patient and he accepted the procedure .

## 2012-06-19 NOTE — H&P (Signed)
    Todd Romero is a 78 y.o. male  Admit date: 06/15/2012 Referring Physician: [name], MD Primary Cardiologist:  HWBSmith, III, MD Chief complaint / reason for admission:  Abnormal cardiolite and history of chest pain  HPI:  The patient gave a history of chest pain 2 months ago which led to a nuclear stress test. The test was abnormal demonstrating mid to apical anterior ischemia. No prior h/o CAD or CVA was noted.    PMH:   No past medical history on file.  PSH:   No past surgical history on file.  ALLERGIES:   Morphine and related  Prior to Admit Meds:   No prescriptions prior to admission   Family HX:   No family history on file. Social HX:    History   Social History  . Marital Status: Married    Spouse Name: N/A    Number of Children: N/A  . Years of Education: N/A   Occupational History  . Not on file.   Social History Main Topics  . Smoking status: Not on file  . Smokeless tobacco: Not on file  . Alcohol Use: Not on file  . Drug Use: Not on file  . Sexually Active: Not on file   Other Topics Concern  . Not on file   Social History Narrative  . No narrative on file    ROS: no CV or neurological complaints.  Physical Exam: Blood pressure 112/53, pulse 61, resp. rate 16, height 5' 7.75" (1.721 m), weight 105.235 kg (232 lb), SpO2 94.00%.   The patient is in no distress. Te chest is clear and there is no wheezing or rales. I/6 systolic murmur is heard The abdomen is soft and demonstrates no tenderness. The extremities reveal symmetrical pulses and no bruits. Neurological exam is normal.  Labs:   Lab Results  Component Value Date   WBC 10.5 02/04/2008   HGB 9.2* 02/04/2008   HCT 27.0* 02/04/2008   MCV 88.7 02/04/2008   PLT 198 02/04/2008   No results found for this basename: NA,K,CL,CO2,BUN,CREATININE,CALCIUM,LABALBU,PROT,BILITOT,ALKPHOS,ALT,AST,GLUCOSE in the last 168 hours   Radiology:  NAD  EKG:  First degree AV block and  RBBB  ASSESSMENT:   1. Probable CAD with nuclear testing suggesting LAD involvement.  Plan:  1. Diagnostic coronary angio to define anatomy and help guide therapy Sinclair Grooms 06/19/2012 7:50 PM

## 2012-06-23 DIAGNOSIS — E785 Hyperlipidemia, unspecified: Secondary | ICD-10-CM | POA: Diagnosis not present

## 2012-06-23 DIAGNOSIS — E119 Type 2 diabetes mellitus without complications: Secondary | ICD-10-CM | POA: Diagnosis not present

## 2012-06-23 DIAGNOSIS — I4949 Other premature depolarization: Secondary | ICD-10-CM | POA: Diagnosis not present

## 2012-06-23 DIAGNOSIS — I4510 Unspecified right bundle-branch block: Secondary | ICD-10-CM | POA: Diagnosis not present

## 2012-06-23 DIAGNOSIS — I2510 Atherosclerotic heart disease of native coronary artery without angina pectoris: Secondary | ICD-10-CM | POA: Diagnosis not present

## 2012-06-23 DIAGNOSIS — I10 Essential (primary) hypertension: Secondary | ICD-10-CM | POA: Diagnosis not present

## 2012-06-27 DIAGNOSIS — D649 Anemia, unspecified: Secondary | ICD-10-CM | POA: Diagnosis not present

## 2012-06-27 DIAGNOSIS — E119 Type 2 diabetes mellitus without complications: Secondary | ICD-10-CM | POA: Diagnosis not present

## 2012-06-29 DIAGNOSIS — Z23 Encounter for immunization: Secondary | ICD-10-CM | POA: Diagnosis not present

## 2012-06-29 DIAGNOSIS — I10 Essential (primary) hypertension: Secondary | ICD-10-CM | POA: Diagnosis not present

## 2012-06-29 DIAGNOSIS — D649 Anemia, unspecified: Secondary | ICD-10-CM | POA: Diagnosis not present

## 2012-06-29 DIAGNOSIS — E119 Type 2 diabetes mellitus without complications: Secondary | ICD-10-CM | POA: Diagnosis not present

## 2012-06-29 DIAGNOSIS — E785 Hyperlipidemia, unspecified: Secondary | ICD-10-CM | POA: Diagnosis not present

## 2012-07-18 DIAGNOSIS — H33009 Unspecified retinal detachment with retinal break, unspecified eye: Secondary | ICD-10-CM | POA: Diagnosis not present

## 2012-07-18 DIAGNOSIS — H3530 Unspecified macular degeneration: Secondary | ICD-10-CM | POA: Diagnosis not present

## 2012-07-18 DIAGNOSIS — H43819 Vitreous degeneration, unspecified eye: Secondary | ICD-10-CM | POA: Diagnosis not present

## 2012-10-03 DIAGNOSIS — D649 Anemia, unspecified: Secondary | ICD-10-CM | POA: Diagnosis not present

## 2012-10-03 DIAGNOSIS — E119 Type 2 diabetes mellitus without complications: Secondary | ICD-10-CM | POA: Diagnosis not present

## 2012-10-03 DIAGNOSIS — E785 Hyperlipidemia, unspecified: Secondary | ICD-10-CM | POA: Diagnosis not present

## 2012-10-05 DIAGNOSIS — D649 Anemia, unspecified: Secondary | ICD-10-CM | POA: Diagnosis not present

## 2012-10-05 DIAGNOSIS — E785 Hyperlipidemia, unspecified: Secondary | ICD-10-CM | POA: Diagnosis not present

## 2012-10-05 DIAGNOSIS — M545 Low back pain: Secondary | ICD-10-CM | POA: Diagnosis not present

## 2012-10-05 DIAGNOSIS — E119 Type 2 diabetes mellitus without complications: Secondary | ICD-10-CM | POA: Diagnosis not present

## 2012-10-16 DIAGNOSIS — M25569 Pain in unspecified knee: Secondary | ICD-10-CM | POA: Diagnosis not present

## 2012-10-16 DIAGNOSIS — M545 Low back pain: Secondary | ICD-10-CM | POA: Diagnosis not present

## 2012-11-02 DIAGNOSIS — H353190 Nonexudative age-related macular degeneration, unspecified eye, stage unspecified: Secondary | ICD-10-CM | POA: Diagnosis not present

## 2012-11-02 DIAGNOSIS — H35039 Hypertensive retinopathy, unspecified eye: Secondary | ICD-10-CM | POA: Diagnosis not present

## 2012-11-02 DIAGNOSIS — H40019 Open angle with borderline findings, low risk, unspecified eye: Secondary | ICD-10-CM | POA: Diagnosis not present

## 2012-11-02 DIAGNOSIS — E119 Type 2 diabetes mellitus without complications: Secondary | ICD-10-CM | POA: Diagnosis not present

## 2012-11-02 DIAGNOSIS — H43399 Other vitreous opacities, unspecified eye: Secondary | ICD-10-CM | POA: Diagnosis not present

## 2012-11-02 DIAGNOSIS — H35419 Lattice degeneration of retina, unspecified eye: Secondary | ICD-10-CM | POA: Diagnosis not present

## 2013-01-30 DIAGNOSIS — E119 Type 2 diabetes mellitus without complications: Secondary | ICD-10-CM | POA: Diagnosis not present

## 2013-01-30 DIAGNOSIS — E785 Hyperlipidemia, unspecified: Secondary | ICD-10-CM | POA: Diagnosis not present

## 2013-02-01 DIAGNOSIS — G4710 Hypersomnia, unspecified: Secondary | ICD-10-CM | POA: Diagnosis not present

## 2013-02-01 DIAGNOSIS — M1990 Unspecified osteoarthritis, unspecified site: Secondary | ICD-10-CM | POA: Diagnosis not present

## 2013-02-01 DIAGNOSIS — I10 Essential (primary) hypertension: Secondary | ICD-10-CM | POA: Diagnosis not present

## 2013-02-01 DIAGNOSIS — E785 Hyperlipidemia, unspecified: Secondary | ICD-10-CM | POA: Diagnosis not present

## 2013-03-23 ENCOUNTER — Encounter: Payer: Self-pay | Admitting: Endocrinology

## 2013-05-02 ENCOUNTER — Other Ambulatory Visit: Payer: Self-pay | Admitting: *Deleted

## 2013-05-02 ENCOUNTER — Other Ambulatory Visit (INDEPENDENT_AMBULATORY_CARE_PROVIDER_SITE_OTHER): Payer: Medicare Other

## 2013-05-02 DIAGNOSIS — E119 Type 2 diabetes mellitus without complications: Secondary | ICD-10-CM | POA: Diagnosis not present

## 2013-05-02 LAB — URINALYSIS
Bilirubin Urine: NEGATIVE
Hgb urine dipstick: NEGATIVE
Total Protein, Urine: NEGATIVE
Urine Glucose: NEGATIVE

## 2013-05-03 LAB — COMPREHENSIVE METABOLIC PANEL
AST: 22 U/L (ref 0–37)
Albumin: 4.2 g/dL (ref 3.5–5.2)
Alkaline Phosphatase: 44 U/L (ref 39–117)
Chloride: 104 mEq/L (ref 96–112)
Glucose, Bld: 114 mg/dL — ABNORMAL HIGH (ref 70–99)
Potassium: 4.6 mEq/L (ref 3.5–5.1)
Sodium: 135 mEq/L (ref 135–145)
Total Protein: 7 g/dL (ref 6.0–8.3)

## 2013-05-04 ENCOUNTER — Ambulatory Visit (INDEPENDENT_AMBULATORY_CARE_PROVIDER_SITE_OTHER): Payer: Medicare Other | Admitting: Endocrinology

## 2013-05-04 ENCOUNTER — Encounter: Payer: Self-pay | Admitting: Endocrinology

## 2013-05-04 VITALS — BP 158/62 | HR 66 | Temp 98.3°F | Resp 12 | Ht 68.0 in | Wt 235.1 lb

## 2013-05-04 DIAGNOSIS — E7800 Pure hypercholesterolemia, unspecified: Secondary | ICD-10-CM

## 2013-05-04 DIAGNOSIS — I10 Essential (primary) hypertension: Secondary | ICD-10-CM

## 2013-05-04 DIAGNOSIS — D649 Anemia, unspecified: Secondary | ICD-10-CM

## 2013-05-04 DIAGNOSIS — R5381 Other malaise: Secondary | ICD-10-CM

## 2013-05-04 DIAGNOSIS — R400 Somnolence: Secondary | ICD-10-CM

## 2013-05-04 DIAGNOSIS — G4710 Hypersomnia, unspecified: Secondary | ICD-10-CM | POA: Diagnosis not present

## 2013-05-04 DIAGNOSIS — E119 Type 2 diabetes mellitus without complications: Secondary | ICD-10-CM | POA: Diagnosis not present

## 2013-05-04 MED ORDER — MODAFINIL 100 MG PO TABS: 100.0000 mg | ORAL_TABLET | Freq: Every day | ORAL | Status: DC

## 2013-05-04 NOTE — Progress Notes (Signed)
Todd Romero is an 79 y.o. male.   Reason for Appointment: Diabetes follow-up   History of Present Illness   Diagnosis: Type 2 DIABETES MELITUS, date of diagnosis:  1997   He has been on various regimens for his diabetes and has been on bedtime insulin with NPH since 2005 He also has benefited from adding Victoza in 2011 with better postprandial control and some weight loss Previously his weight has been as much as 247 pounds   His recent blood sugar control appears to be a little worse with higher A1c of 7.4%, previously a low as 6.7 However his blood sugars at home are looking fairly good and he is checking readings in the afternoons and evenings periodically also However he is taking only 0.6 mg of Victoza for cost and weight has not come down despite exercising        Oral hypoglycemic drugs: Amaryl and metformin        Side effects from medications: None Insulin regimen: NPH 25 units at bedtime           Proper timing of medications in relation to meals: Yes.         Monitors blood glucose: Once a day.    Glucometer: One Touch.          Blood Glucose readings from meter download: readings before breakfast: Ranging from 114-156 and nonfasting range 116-205 with only 4 readings over 160. Overall average 141 Hypoglycemia frequency: Never.          Meals: 3 meals per day.          Physical activity: exercise: 3/7 days at the gym, also lawn mowing and riding the  bike           Complications: are: None    The last HbgA1c was reported as 7.2    Lab Results  Component Value Date   MICROALBUR 0.6 05/02/2013     Wt Readings from Last 3 Encounters:  05/04/13 235 lb 1.6 oz (106.641 kg)  06/15/12 232 lb (105.235 kg)  06/15/12 232 lb (105.235 kg)    Appointment on 05/02/2013  Component Date Value Range Status  . Hemoglobin A1C 05/02/2013 7.4* 4.6 - 6.5 % Final   Glycemic Control Guidelines for People with Diabetes:Non Diabetic:  <6%Goal of Therapy: <7%Additional Action  Suggested:  >8%   . Sodium 05/02/2013 135  135 - 145 mEq/L Final  . Potassium 05/02/2013 4.6  3.5 - 5.1 mEq/L Final  . Chloride 05/02/2013 104  96 - 112 mEq/L Final  . CO2 05/02/2013 24  19 - 32 mEq/L Final  . Glucose, Bld 05/02/2013 114* 70 - 99 mg/dL Final  . BUN 05/02/2013 18  6 - 23 mg/dL Final  . Creatinine, Ser 05/02/2013 0.9  0.4 - 1.5 mg/dL Final  . Total Bilirubin 05/02/2013 0.6  0.3 - 1.2 mg/dL Final  . Alkaline Phosphatase 05/02/2013 44  39 - 117 U/L Final  . AST 05/02/2013 22  0 - 37 U/L Final  . ALT 05/02/2013 17  0 - 53 U/L Final  . Total Protein 05/02/2013 7.0  6.0 - 8.3 g/dL Final  . Albumin 05/02/2013 4.2  3.5 - 5.2 g/dL Final  . Calcium 05/02/2013 9.6  8.4 - 10.5 mg/dL Final  . GFR 05/02/2013 86.94  >60.00 mL/min Final  . Color, Urine 05/02/2013 LT. YELLOW  Yellow;Lt. Yellow Final  . APPearance 05/02/2013 CLEAR  Clear Final  . Specific Gravity, Urine 05/02/2013 1.010  1.000-1.030 Final  .  pH 05/02/2013 6.0  5.0 - 8.0 Final  . Total Protein, Urine 05/02/2013 NEGATIVE  Negative Final  . Urine Glucose 05/02/2013 NEGATIVE  Negative Final  . Ketones, ur 05/02/2013 NEGATIVE  Negative Final  . Bilirubin Urine 05/02/2013 NEGATIVE  Negative Final  . Hgb urine dipstick 05/02/2013 NEGATIVE  Negative Final  . Urobilinogen, UA 05/02/2013 0.2  0.0 - 1.0 Final  . Leukocytes, UA 05/02/2013 NEGATIVE  Negative Final  . Nitrite 05/02/2013 NEGATIVE  Negative Final  . Microalb, Ur 05/02/2013 0.6  0.0 - 1.9 mg/dL Final  . Creatinine,U 05/02/2013 40.1   Final  . Microalb Creat Ratio 05/02/2013 1.5  0.0 - 30.0 mg/g Final      Medication List       This list is accurate as of: 05/04/13 10:43 AM.  Always use your most recent med list.               ACCU-CHEK AVIVA test strip  Generic drug:  glucose blood  1 each by Other route 2 (two) times daily. Use as instructed     aspirin 81 MG tablet  Take 81 mg by mouth daily.     B-D UF III MINI PEN NEEDLES 31G X 5 MM Misc  Generic  drug:  Insulin Pen Needle  by Does not apply route.     BD INSULIN SYRINGE ULTRAFINE 31G X 5/16" 1 ML Misc  Generic drug:  Insulin Syringe-Needle U-100  by Does not apply route.     ferrous sulfate 325 (65 FE) MG tablet  Take 325 mg by mouth daily with breakfast.     fish oil-omega-3 fatty acids 1000 MG capsule  Take 2 g by mouth daily.     glimepiride 2 MG tablet  Commonly known as:  AMARYL  Take 2 mg by mouth daily before breakfast. 1/2 tablet once a day     insulin NPH 100 UNIT/ML injection  Commonly known as:  HUMULIN N,NOVOLIN N  Inject 25 Units into the skin. Uses Vial     metFORMIN 1000 MG tablet  Commonly known as:  GLUCOPHAGE  Take 1,000 mg by mouth 2 (two) times daily with a meal.     multivitamin with minerals Tabs tablet  Take 1 tablet by mouth daily.     olmesartan-hydrochlorothiazide 40-12.5 MG per tablet  Commonly known as:  BENICAR HCT  Take 1 tablet by mouth daily.     pravastatin 40 MG tablet  Commonly known as:  PRAVACHOL  Take 40 mg by mouth daily.     PRESERVISION/LUTEIN Caps  Take by mouth daily.     tamsulosin 0.4 MG Caps capsule  Commonly known as:  FLOMAX  Take by mouth.     VICTOZA 18 MG/3ML Sopn  Generic drug:  Liraglutide  Inject 0.6 mg into the skin.        Allergies:  Allergies  Allergen Reactions  . Morphine And Related Nausea And Vomiting    No past medical history on file.  No past surgical history on file.  No family history on file.  Social History:  reports that he has never smoked. He has never used smokeless tobacco. His alcohol and drug histories are not on file.  Review of Systems:  HYPERTENSION:  Blood pressure is relatively higher today but he is under stress and he thinks it is well-controlled at home with systolic reading [id number]. Does have previous white coat syndrome history   HYPERLIPIDEMIA: The lipid abnormality consists of elevated LDL and he  is taking the whole tablet of Pravachol now, no side  effects.  He complains of daytime somnolence again, previously had reduced sleep study. He'll ice it that he has mild snoring; also has late insomnia but does not feel tired on waking up  History of overactive bladder, currently not on treatment   Examination:   BP 158/62  Pulse 66  Temp(Src) 98.3 F (36.8 C)  Resp 12  Ht 5\' 8"  (1.727 m)  Wt 235 lb 1.6 oz (106.641 kg)  BMI 35.76 kg/m2  SpO2 95%  Body mass index is 35.76 kg/(m^2).   No ankle edema present  ASSESSMENT/ PLAN::   Diabetes type 2   The patient's diabetes control appears to be relatively worse with higher A1c although he does not appear to have any persistent hyperglycemia and his average blood sugar at home is only 141. He is checking his blood sugars occasionally after meals and they are only rarely high but most of his readings are before breakfast and late afternoon He is overall trying to do well with exercise and most of the time consistent with diet He is however taking only 0.6 mg of Victoza and should be able to get better control especially postprandial readings with 1.2 mg. This may also improve his diet and also facilitate more weight loss  DAYTIME somnolence: Not clear if he has an element of sleep apnea and he does not want to do a sleep study again. His wife does not think he has excessive snoring and he does not wake up feeling tired. Advised him that he can try Provigil before going on his trips and long drives; he may take it regularly if it appears to be significantly beneficial, we'll start with 100 mg only HYPERTENSION: Blood pressure is relatively higher today but he is under stress and he thinks it is well-controlled at home. Does have previous white coat syndrome history  Hypercholesterolemia: He has had relatively high LDL previously and is taking 40 mg instead of 20 mg now pravastatin. Will need to recheck his lipids on the next visit History of anemia: Will recheck CBC on the next  visit  Overactive bladder: He has mild to moderate symptoms and does not want to take medications unless he is traveling, he can take Enablex around the time he is traveling    [facility] 05/04/2013, 10:43 AM  \

## 2013-05-04 NOTE — Patient Instructions (Addendum)
Victoza 1.2 mg daily  Enablex dose is 7.5-15 mg daily

## 2013-05-09 ENCOUNTER — Other Ambulatory Visit: Payer: Self-pay | Admitting: *Deleted

## 2013-05-09 MED ORDER — GLUCOSE BLOOD VI STRP: ORAL_STRIP | Status: DC

## 2013-05-12 DIAGNOSIS — I10 Essential (primary) hypertension: Secondary | ICD-10-CM | POA: Insufficient documentation

## 2013-05-12 DIAGNOSIS — E7800 Pure hypercholesterolemia, unspecified: Secondary | ICD-10-CM | POA: Insufficient documentation

## 2013-05-22 ENCOUNTER — Telehealth: Payer: Self-pay | Admitting: Endocrinology

## 2013-05-22 MED ORDER — LIRAGLUTIDE 18 MG/3ML ~~LOC~~ SOPN: 1.2000 mg | PEN_INJECTOR | Freq: Every day | SUBCUTANEOUS | Status: DC

## 2013-05-22 NOTE — Telephone Encounter (Signed)
rx sent

## 2013-06-03 DIAGNOSIS — Z23 Encounter for immunization: Secondary | ICD-10-CM | POA: Diagnosis not present

## 2013-06-04 ENCOUNTER — Telehealth: Payer: Self-pay | Admitting: Endocrinology

## 2013-06-04 MED ORDER — GLIMEPIRIDE 2 MG PO TABS: 2.0000 mg | ORAL_TABLET | Freq: Every day | ORAL | Status: DC

## 2013-06-04 NOTE — Telephone Encounter (Signed)
rx sent

## 2013-06-05 ENCOUNTER — Other Ambulatory Visit: Payer: Self-pay | Admitting: *Deleted

## 2013-06-18 ENCOUNTER — Encounter: Payer: Self-pay | Admitting: *Deleted

## 2013-06-18 ENCOUNTER — Encounter: Payer: Self-pay | Admitting: Cardiology

## 2013-06-18 DIAGNOSIS — I4510 Unspecified right bundle-branch block: Secondary | ICD-10-CM | POA: Insufficient documentation

## 2013-06-18 DIAGNOSIS — E119 Type 2 diabetes mellitus without complications: Secondary | ICD-10-CM | POA: Insufficient documentation

## 2013-06-18 DIAGNOSIS — I2510 Atherosclerotic heart disease of native coronary artery without angina pectoris: Secondary | ICD-10-CM | POA: Insufficient documentation

## 2013-06-18 DIAGNOSIS — E785 Hyperlipidemia, unspecified: Secondary | ICD-10-CM | POA: Insufficient documentation

## 2013-06-21 ENCOUNTER — Encounter: Payer: Self-pay | Admitting: Interventional Cardiology

## 2013-06-21 ENCOUNTER — Ambulatory Visit (INDEPENDENT_AMBULATORY_CARE_PROVIDER_SITE_OTHER): Payer: Medicare Other | Admitting: Interventional Cardiology

## 2013-06-21 VITALS — BP 164/70 | HR 61 | Ht 69.0 in | Wt 226.0 lb

## 2013-06-21 DIAGNOSIS — I2510 Atherosclerotic heart disease of native coronary artery without angina pectoris: Secondary | ICD-10-CM | POA: Diagnosis not present

## 2013-06-21 DIAGNOSIS — I4510 Unspecified right bundle-branch block: Secondary | ICD-10-CM | POA: Diagnosis not present

## 2013-06-21 DIAGNOSIS — E785 Hyperlipidemia, unspecified: Secondary | ICD-10-CM

## 2013-06-21 DIAGNOSIS — I10 Essential (primary) hypertension: Secondary | ICD-10-CM | POA: Diagnosis not present

## 2013-06-21 DIAGNOSIS — E119 Type 2 diabetes mellitus without complications: Secondary | ICD-10-CM

## 2013-06-21 NOTE — Patient Instructions (Signed)
Your physician recommends that you continue on your current medications as directed. Please refer to the Current Medication list given to you today.  Your physician recommends that you schedule a follow-up appointment in: 1 year with Dr.Smith  Your physician discussed the importance of regular exercise and recommended that you start or continue a regular exercise program for good health.

## 2013-06-21 NOTE — Progress Notes (Signed)
Patient ID: Todd Romero, male   DOB: [birth year], 79 y.o.   MRN: [id number]    HPI The patient relates that his son is incarcerated. Both he and [name] are under significant stress. He denies palpitations, syncope, nitroglycerin use, chest pain, but has not exercised in 2 months. There is no orthopnea, or PND. He denies medication side effects. No prolonged palpitations, or syncope. Allergies  Allergen Reactions  . Morphine And Related Nausea And Vomiting    Current Outpatient Prescriptions  Medication Sig Dispense Refill  . amLODipine (NORVASC) 5 MG tablet TAKE ONE TABLET DAILY IN THE EVENING      . aspirin 81 MG tablet Take 81 mg by mouth daily.      . ferrous sulfate 325 (65 FE) MG tablet Take 325 mg by mouth daily with breakfast.      . fish oil-omega-3 fatty acids 1000 MG capsule Take 2 g by mouth daily.      [name] glimepiride (AMARYL) 2 MG tablet Take 1 tablet (2 mg total) by mouth daily before breakfast. 1/2 tablet once a day  60 tablet  5  . glucose blood (ACCU-CHEK AVIVA) test strip Use as instructed to check blood sugars twice a day dx code 250.00  200 each  1  . insulin NPH (HUMULIN N,NOVOLIN N) 100 UNIT/ML injection Inject 25 Units into the skin. Uses Vial      . Insulin Pen Needle (B-D UF III MINI PEN NEEDLES) 31G X 5 MM MISC by Does not apply route.      . Insulin Syringe-Needle U-100 (BD INSULIN SYRINGE ULTRAFINE) 31G X 5/16" 1 ML MISC by Does not apply route.      . Liraglutide (VICTOZA) 18 MG/3ML SOPN Inject 1.2 mg into the skin daily.  6 pen  3  . metFORMIN (GLUCOPHAGE) 1000 MG tablet Take 1,000 mg by mouth 2 (two) times daily with a meal.      . modafinil (PROVIGIL) 100 MG tablet Take 1 tablet (100 mg total) by mouth daily.  30 tablet  1  . Multiple Vitamin (MULTIVITAMIN WITH MINERALS) TABS tablet Take 1 tablet by mouth daily.      . Multiple Vitamins-Minerals (PRESERVISION/LUTEIN) CAPS Take by mouth daily.      [name] olmesartan-hydrochlorothiazide (BENICAR HCT)  40-12.5 MG per tablet Take 1 tablet by mouth daily.      . pravastatin (PRAVACHOL) 40 MG tablet Take 40 mg by mouth daily.      . tamsulosin (FLOMAX) 0.4 MG CAPS capsule Take by mouth.       No current facility-administered medications for this visit.    Past Medical History  Diagnosis Date  . Unspecified essential hypertension   . Pure hypercholesterolemia   . Type II or unspecified type diabetes mellitus without mention of complication, not stated as uncontrolled   . RBBB   . CAD (coronary artery disease)   . Diabetes   . Hyperlipidemia     Past Surgical History  Procedure Laterality Date  . Vein surgery    . Anterior cruciate ligament repair    . Carpal tunnel release      ROS: Under stress because of his son's incarceration. Feeling depressed. Not exercising. Gaining weight. No edema. No orthopnea, or PND  PHYSICAL EXAM BP 164/70  Pulse 61  Ht 5\' 9"  (1.753 m)  Wt 226 lb (102.513 kg)  BMI 33.36 kg/m2  No JVD. No carotid bruits. Chest is clear to auscultation and percussion Cardiac exam reveals a 1-2  of 6 right upper sternal border. Systolic murmur Abdomen is soft. Bowel sounds are normal. Neurological exam is intact  EKG: Normal sinus rhythm with right bundle branch block and left axis deviation.    ASSESSMENT AND PLAN  1. Coronary atherosclerotic heart disease, asymptomatic. Asymptomatic.  2. Hypertension, not well controlled. Today. Patient is under a lot of stress due to family matters.  3. Hyperlipidemia, on therapy  4. Abnormal EKG with right bundle branch block and left axis deviation, unchanged  Overall, doing quite well. Asymptomatic coronary disease. Blood pressure will elevated today, possibly with a stress component. I've encouraged him to resume an active lifestyle. This would help to control. Risk factors. Plan to see him back in one year.

## 2013-07-13 ENCOUNTER — Telehealth: Payer: Self-pay | Admitting: Endocrinology

## 2013-07-13 ENCOUNTER — Other Ambulatory Visit: Payer: Self-pay | Admitting: *Deleted

## 2013-07-13 MED ORDER — PRAVASTATIN SODIUM 40 MG PO TABS: 40.0000 mg | ORAL_TABLET | Freq: Every day | ORAL | Status: DC

## 2013-07-13 NOTE — Telephone Encounter (Signed)
rx sent

## 2013-07-24 DIAGNOSIS — H43819 Vitreous degeneration, unspecified eye: Secondary | ICD-10-CM | POA: Diagnosis not present

## 2013-07-31 ENCOUNTER — Other Ambulatory Visit (INDEPENDENT_AMBULATORY_CARE_PROVIDER_SITE_OTHER): Payer: Medicare Other

## 2013-07-31 DIAGNOSIS — D649 Anemia, unspecified: Secondary | ICD-10-CM

## 2013-07-31 DIAGNOSIS — I10 Essential (primary) hypertension: Secondary | ICD-10-CM

## 2013-07-31 DIAGNOSIS — E119 Type 2 diabetes mellitus without complications: Secondary | ICD-10-CM | POA: Diagnosis not present

## 2013-07-31 DIAGNOSIS — R5381 Other malaise: Secondary | ICD-10-CM

## 2013-07-31 DIAGNOSIS — E7800 Pure hypercholesterolemia, unspecified: Secondary | ICD-10-CM

## 2013-07-31 LAB — COMPREHENSIVE METABOLIC PANEL
ALT: 16 U/L (ref 0–53)
CO2: 23 mEq/L (ref 19–32)
Calcium: 9.7 mg/dL (ref 8.4–10.5)
Chloride: 103 mEq/L (ref 96–112)
Creatinine, Ser: 1 mg/dL (ref 0.4–1.5)
GFR: 79.69 mL/min (ref >60.00)
Glucose, Bld: 93 mg/dL (ref 70–99)
Sodium: 135 mEq/L (ref 135–145)
Total Protein: 7 g/dL (ref 6.0–8.3)

## 2013-07-31 LAB — CBC WITH DIFFERENTIAL/PLATELET
Basophils Absolute: 0 10*3/uL (ref 0.0–0.1)
Basophils Relative: 0.4 % (ref 0.0–3.0)
Eosinophils Relative: 1.7 % (ref 0.0–5.0)
HCT: 37.7 % — ABNORMAL LOW (ref 39.0–52.0)
Hemoglobin: 12.8 g/dL — ABNORMAL LOW (ref 13.0–17.0)
Lymphocytes Relative: 27.8 % (ref 12.0–46.0)
Lymphs Abs: 2.1 10*3/uL (ref 0.7–4.0)
MCHC: 33.9 g/dL (ref 30.0–36.0)
Monocytes Relative: 9 % (ref 3.0–12.0)
Neutro Abs: 4.6 10*3/uL (ref 1.4–7.7)
RDW: 14.4 % (ref 11.5–14.6)
WBC: 7.6 10*3/uL (ref 4.5–10.5)

## 2013-07-31 LAB — TSH: TSH: 0.66 u[IU]/mL (ref 0.35–5.50)

## 2013-07-31 LAB — LIPID PANEL
Cholesterol: 176 mg/dL (ref 0–200)
HDL: 56.5 mg/dL (ref >39.00)
VLDL: 18.4 mg/dL (ref 0.0–40.0)

## 2013-07-31 LAB — HEMOGLOBIN A1C: Hgb A1c MFr Bld: 6.9 % — ABNORMAL HIGH (ref 4.6–6.5)

## 2013-07-31 LAB — T4, FREE: Free T4: 1.03 ng/dL (ref 0.60–1.60)

## 2013-08-06 ENCOUNTER — Encounter: Payer: Self-pay | Admitting: Endocrinology

## 2013-08-06 ENCOUNTER — Ambulatory Visit (INDEPENDENT_AMBULATORY_CARE_PROVIDER_SITE_OTHER): Payer: Medicare Other | Admitting: Endocrinology

## 2013-08-06 VITALS — BP 134/60 | HR 60 | Temp 98.6°F | Resp 12 | Ht 69.0 in | Wt 227.0 lb

## 2013-08-06 DIAGNOSIS — E119 Type 2 diabetes mellitus without complications: Secondary | ICD-10-CM

## 2013-08-06 DIAGNOSIS — E7800 Pure hypercholesterolemia, unspecified: Secondary | ICD-10-CM

## 2013-08-06 DIAGNOSIS — E785 Hyperlipidemia, unspecified: Secondary | ICD-10-CM

## 2013-08-06 NOTE — Progress Notes (Signed)
Todd Romero is an 79 y.o. male.   Reason for Appointment: Diabetes follow-up   History of Present Illness   Diagnosis: Type 2 DIABETES MELITUS, date of diagnosis:  1997   He has been on various regimens for his diabetes and has been on bedtime insulin with NPH since 2005 He also has benefited from adding Victoza in 2011 with better postprandial control and some weight loss Previously his weight has been as much as 247 pounds   His previous blood sugar control was little worse with higher A1c of 7.4%, previously a low as 6.7 With increasing his Victoza to 1.2 mg his blood sugars appear to be significantly better and his A1c is also now below 7% No side effects with this and previously was doing 0.64 economical reasons Recently his blood sugars at home are looking better especially in the mornings and he is checking readings in the afternoons and evenings periodically also     Oral hypoglycemic drugs: Amaryl in the evening and metformin        Side effects from medications: None Insulin regimen: NPH 25 units at bedtime           Proper timing of medications in relation to meals: Yes.         Monitors blood glucose: Once a day.    Glucometer: ? Accu-Chek         Blood Glucose readings from meter download:   PREMEAL Breakfast Lunch Dinner  6-8 PM   overall   Glucose range:  88-117   121-163   90-160    Mean:  100      111     Hypoglycemia frequency:  none although may feel a little weak sometimes after yard work, does not check his blood sugar then.          Meals: 3 meals per day.          Physical activity: exercise: 3/7 days at the gym, also riding the  bike. Recently exercise is intermittent because of traveling           Complications: are: None     Wt Readings from Last 3 Encounters:  08/06/13 227 lb (102.967 kg)  06/21/13 226 lb (102.513 kg)  05/04/13 235 lb 1.6 oz (106.641 kg)   LABS: Lab Results  Component Value Date   HGBA1C 6.9* 07/31/2013   HGBA1C 7.4*  05/02/2013   Lab Results  Component Value Date   MICROALBUR 0.6 05/02/2013   LDLCALC 101* 07/31/2013   CREATININE 1.0 07/31/2013     Appointment on 07/31/2013  Component Date Value Range Status  . Cholesterol 07/31/2013 176  0 - 200 mg/dL Final   ATP III Classification       Desirable:  < 200 mg/dL               Borderline High:  200 - 239 mg/dL          High:  > = 240 mg/dL  . Triglycerides 07/31/2013 92.0  0.0 - 149.0 mg/dL Final   Normal:  <150 mg/dLBorderline High:  150 - 199 mg/dL  . HDL 07/31/2013 56.50  >39.00 mg/dL Final  . VLDL 07/31/2013 18.4  0.0 - 40.0 mg/dL Final  . LDL Cholesterol 07/31/2013 101* 0 - 99 mg/dL Final  . Total CHOL/HDL Ratio 07/31/2013 3   Final                  Men  Women1/2 Average Risk     3.4          3.3Average Risk          5.0          4.42X Average Risk          9.6          7.13X Average Risk          15.0          11.0                      . Free T4 07/31/2013 1.03  0.60 - 1.60 ng/dL Final  . TSH 07/31/2013 0.66  0.35 - 5.50 uIU/mL Final  . WBC 07/31/2013 7.6  4.5 - 10.5 K/uL Final  . RBC 07/31/2013 4.12* 4.22 - 5.81 Mil/uL Final  . Hemoglobin 07/31/2013 12.8* 13.0 - 17.0 g/dL Final  . HCT 07/31/2013 37.7* 39.0 - 52.0 % Final  . MCV 07/31/2013 91.6  78.0 - 100.0 fl Final  . MCHC 07/31/2013 33.9  30.0 - 36.0 g/dL Final  . RDW 07/31/2013 14.4  11.5 - 14.6 % Final  . Platelets 07/31/2013 203.0  150.0 - 400.0 K/uL Final  . Neutrophils Relative % 07/31/2013 61.1  43.0 - 77.0 % Final  . Lymphocytes Relative 07/31/2013 27.8  12.0 - 46.0 % Final  . Monocytes Relative 07/31/2013 9.0  3.0 - 12.0 % Final  . Eosinophils Relative 07/31/2013 1.7  0.0 - 5.0 % Final  . Basophils Relative 07/31/2013 0.4  0.0 - 3.0 % Final  . Neutro Abs 07/31/2013 4.6  1.4 - 7.7 K/uL Final  . Lymphs Abs 07/31/2013 2.1  0.7 - 4.0 K/uL Final  . Monocytes Absolute 07/31/2013 0.7  0.1 - 1.0 K/uL Final  . Eosinophils Absolute 07/31/2013 0.1  0.0 - 0.7 K/uL Final  .  Basophils Absolute 07/31/2013 0.0  0.0 - 0.1 K/uL Final  . Sodium 07/31/2013 135  135 - 145 mEq/L Final  . Potassium 07/31/2013 4.4  3.5 - 5.1 mEq/L Final  . Chloride 07/31/2013 103  96 - 112 mEq/L Final  . CO2 07/31/2013 23  19 - 32 mEq/L Final  . Glucose, Bld 07/31/2013 93  70 - 99 mg/dL Final  . BUN 07/31/2013 21  6 - 23 mg/dL Final  . Creatinine, Ser 07/31/2013 1.0  0.4 - 1.5 mg/dL Final  . Total Bilirubin 07/31/2013 0.7  0.3 - 1.2 mg/dL Final  . Alkaline Phosphatase 07/31/2013 46  39 - 117 U/L Final  . AST 07/31/2013 20  0 - 37 U/L Final  . ALT 07/31/2013 16  0 - 53 U/L Final  . Total Protein 07/31/2013 7.0  6.0 - 8.3 g/dL Final  . Albumin 07/31/2013 4.0  3.5 - 5.2 g/dL Final  . Calcium 07/31/2013 9.7  8.4 - 10.5 mg/dL Final  . GFR 07/31/2013 79.69  >60.00 mL/min Final  . Hemoglobin A1C 07/31/2013 6.9* 4.6 - 6.5 % Final   Glycemic Control Guidelines for People with Diabetes:Non Diabetic:  <6%Goal of Therapy: <7%Additional Action Suggested:  >8%       Medication List       This list is accurate as of: 08/06/13 10:12 AM.  Always use your most recent med list.               amLODipine 5 MG tablet  Commonly known as:  NORVASC  TAKE ONE TABLET DAILY IN THE EVENING     aspirin 81  MG tablet  Take 81 mg by mouth daily.     B-D UF III MINI PEN NEEDLES 31G X 5 MM Misc  Generic drug:  Insulin Pen Needle  by Does not apply route.     BD INSULIN SYRINGE ULTRAFINE 31G X 5/16" 1 ML Misc  Generic drug:  Insulin Syringe-Needle U-100  by Does not apply route.     ferrous sulfate 325 (65 FE) MG tablet  Take 325 mg by mouth daily with breakfast.     fish oil-omega-3 fatty acids 1000 MG capsule  Take 2 g by mouth daily.     glimepiride 2 MG tablet  Commonly known as:  AMARYL  Take 1 tablet (2 mg total) by mouth daily before breakfast. 1/2 tablet once a day     glucose blood test strip  Commonly known as:  ACCU-CHEK AVIVA  Use as instructed to check blood sugars twice a day dx  code 250.00     insulin NPH 100 UNIT/ML injection  Commonly known as:  HUMULIN N,NOVOLIN N  Inject 25 Units into the skin. Uses Vial     Liraglutide 18 MG/3ML Sopn  Commonly known as:  VICTOZA  Inject 1.2 mg into the skin daily.     metFORMIN 1000 MG tablet  Commonly known as:  GLUCOPHAGE  Take 1,000 mg by mouth 2 (two) times daily with a meal.     modafinil 100 MG tablet  Commonly known as:  PROVIGIL  Take 1 tablet (100 mg total) by mouth daily.     multivitamin with minerals Tabs tablet  Take 1 tablet by mouth daily.     olmesartan-hydrochlorothiazide 40-12.5 MG per tablet  Commonly known as:  BENICAR HCT  Take 1 tablet by mouth daily.     pravastatin 40 MG tablet  Commonly known as:  PRAVACHOL  Take 1 tablet (40 mg total) by mouth daily.     PRESERVISION/LUTEIN Caps  Take by mouth daily.     tamsulosin 0.4 MG Caps capsule  Commonly known as:  FLOMAX  Take by mouth.        Allergies:  Allergies  Allergen Reactions  . Morphine And Related Nausea And Vomiting    Past Medical History  Diagnosis Date  . Unspecified essential hypertension   . Pure hypercholesterolemia   . Type II or unspecified type diabetes mellitus without mention of complication, not stated as uncontrolled   . RBBB   . CAD (coronary artery disease)   . Diabetes   . Hyperlipidemia     Past Surgical History  Procedure Laterality Date  . Vein surgery    . Anterior cruciate ligament repair    . Carpal tunnel release      No family history on file.  Social History:  reports that he has never smoked. He has never used smokeless tobacco. His alcohol and drug histories are not on file.  Review of Systems:  HYPERTENSION:  Blood pressure is relatively higher today but he is under stress and he thinks it is well-controlled at home with systolic reading. Recently not checked  HYPERLIPIDEMIA: The lipid abnormality consists of elevated LDL and he is taking the whole tablet of Pravachol now, no  side effects.  He has had daytime somnolence , previously had refused sleep study. Has had mild snoring; also has late insomnia but does not feel tired on waking up Was prescribed Provigil but this was not covered by insurance  History of overactive bladder,  and he takes Enablex  only when he is traveling  Mild anemia: He is taking iron supplements daily and hemoglobin is 12.8   Examination:   BP 134/60  Pulse 60  Temp(Src) 98.6 F (37 C)  Resp 12  Ht 5\' 9"  (1.753 m)  Wt 227 lb (102.967 kg)  BMI 33.51 kg/m2  SpO2 96%  Body mass index is 33.51 kg/(m^2).   No leg edema present  ASSESSMENT/ PLAN::   Diabetes type 2   The patient's diabetes control appears to be significantly better with increasing his Victoza Although he has not lost weight he has fairly consistent blood sugars Since his fasting readings are low normal he can reduce his NPH insulin to 22 Also will try taking Amaryl in the morning instead of evening. To call if he has any hypoglycemia, discussed symptoms, needing to check blood sugars at that time and treatment. May also consider stopping Amaryl Continue Victoza 1.2 mg He can be more regular with exercise when he is not traveling  HYPERTENSION: Blood pressure is well-controlled    Hypercholesterolemia: He has had relatively high LDL previously and is better with taking 40 mg instead of 20 mg  pravastatin.   History of anemia:  Hemoglobin stable, can reduce iron to 3 times a week   Eleri Ruben 08/06/2013, 10:12 AM  \

## 2013-08-06 NOTE — Patient Instructions (Addendum)
INSULIN 22 at night and keep am sugar <120  Iron 3 tabs a week  Change Glimeperide to ams

## 2013-08-27 DIAGNOSIS — M25569 Pain in unspecified knee: Secondary | ICD-10-CM | POA: Diagnosis not present

## 2013-09-26 ENCOUNTER — Other Ambulatory Visit: Payer: Self-pay | Admitting: *Deleted

## 2013-09-26 MED ORDER — AMLODIPINE BESYLATE 5 MG PO TABS: 5.0000 mg | ORAL_TABLET | Freq: Every day | ORAL | Status: DC

## 2013-09-26 MED ORDER — TAMSULOSIN HCL 0.4 MG PO CAPS: 0.4000 mg | ORAL_CAPSULE | Freq: Every day | ORAL | Status: DC

## 2013-09-26 MED ORDER — OLMESARTAN MEDOXOMIL-HCTZ 40-12.5 MG PO TABS: 1.0000 | ORAL_TABLET | Freq: Every day | ORAL | Status: DC

## 2013-11-23 ENCOUNTER — Other Ambulatory Visit: Payer: Self-pay | Admitting: *Deleted

## 2013-11-23 MED ORDER — GLUCOSE BLOOD VI STRP: ORAL_STRIP | Status: DC

## 2013-12-03 ENCOUNTER — Other Ambulatory Visit (INDEPENDENT_AMBULATORY_CARE_PROVIDER_SITE_OTHER): Payer: Medicare Other

## 2013-12-03 ENCOUNTER — Other Ambulatory Visit: Payer: Medicare Other

## 2013-12-03 DIAGNOSIS — E119 Type 2 diabetes mellitus without complications: Secondary | ICD-10-CM

## 2013-12-03 LAB — BASIC METABOLIC PANEL
BUN: 24 mg/dL — AB (ref 6–23)
CALCIUM: 10 mg/dL (ref 8.4–10.5)
CO2: 26 mEq/L (ref 19–32)
CREATININE: 0.9 mg/dL (ref 0.4–1.5)
Chloride: 99 mEq/L (ref 96–112)
GFR: 83.58 mL/min (ref >60.00)
GLUCOSE: 143 mg/dL — AB (ref 70–99)
Potassium: 4.4 mEq/L (ref 3.5–5.1)
Sodium: 133 mEq/L — ABNORMAL LOW (ref 135–145)

## 2013-12-03 LAB — HEMOGLOBIN A1C: Hgb A1c MFr Bld: 7.5 % — ABNORMAL HIGH (ref 4.6–6.5)

## 2013-12-06 ENCOUNTER — Other Ambulatory Visit: Payer: Self-pay | Admitting: *Deleted

## 2013-12-06 ENCOUNTER — Ambulatory Visit (INDEPENDENT_AMBULATORY_CARE_PROVIDER_SITE_OTHER): Payer: Medicare Other | Admitting: Endocrinology

## 2013-12-06 ENCOUNTER — Encounter: Payer: Self-pay | Admitting: Endocrinology

## 2013-12-06 VITALS — BP 144/64 | HR 66 | Temp 98.4°F | Resp 16 | Ht 69.0 in | Wt 230.0 lb

## 2013-12-06 DIAGNOSIS — I10 Essential (primary) hypertension: Secondary | ICD-10-CM | POA: Diagnosis not present

## 2013-12-06 DIAGNOSIS — D509 Iron deficiency anemia, unspecified: Secondary | ICD-10-CM | POA: Diagnosis not present

## 2013-12-06 DIAGNOSIS — IMO0001 Reserved for inherently not codable concepts without codable children: Secondary | ICD-10-CM | POA: Diagnosis not present

## 2013-12-06 DIAGNOSIS — E785 Hyperlipidemia, unspecified: Secondary | ICD-10-CM

## 2013-12-06 DIAGNOSIS — E1165 Type 2 diabetes mellitus with hyperglycemia: Principal | ICD-10-CM

## 2013-12-06 MED ORDER — INSULIN PEN NEEDLE 31G X 5 MM MISC: Status: DC

## 2013-12-06 NOTE — Progress Notes (Signed)
Todd Romero is an 80 y.o. male.    Reason for Appointment: Diabetes follow-up   History of Present Illness   Diagnosis: Type 2 DIABETES MELITUS, date of diagnosis:  1997   He has been on various regimens for his diabetes and has been on bedtime insulin with NPH since 2005 He also has benefited from adding Victoza in 2011 with better postprandial control and some weight loss Previously his weight has been as much as 247 pounds   With increasing his Victoza to 1.2 mg his blood sugars did improve and A1c was down to 6.9 in 11/14 On his last visit because of low normal readings in the mornings his insulin was reduced by 3 units to avoid nocturnal hypoglycemia Also his Amaryl was changed to half a tablet in the morning However recently has been traveling to [place] more and getting out and doing less exercise his A1c appears to be higher He has gained 3 pounds Recently he does not have any consistent high readings but occasionally has readings over 150 in the evenings Some of them may tend to be within an hour after eating Recently fasting readings are excellent.     Oral hypoglycemic drugs: Amaryl in the evening and metformin        Side effects from medications: None Insulin regimen: NPH 22 units at bedtime           Proper timing of medications in relation to meals: Yes.         Monitors blood glucose: Once a day.    Glucometer: ? Accu-Chek         Blood Glucose readings from meter download:   PREMEAL Breakfast Lunch Dinner Bedtime Overall  Glucose range:  85-124  ?   97-152   96-204    Mean/median:  108     142   121     Hypoglycemia frequency:  none recently, discussed symptoms of hypoglycemia            Meals: 3 meals per day. Has been eating out more lately.          Physical activity: exercise: 3/7 days at the gym, also riding the  bike. Recently exercise is intermittent because of traveling           Complications: are: None     Wt Readings from Last 3 Encounters:   12/06/13 230 lb (104.327 kg)  08/06/13 227 lb (102.967 kg)  06/21/13 226 lb (102.513 kg)   LABS: Lab Results  Component Value Date   HGBA1C 7.5* 12/03/2013   HGBA1C 6.9* 07/31/2013   HGBA1C 7.4* 05/02/2013   Lab Results  Component Value Date   MICROALBUR 0.6 05/02/2013   LDLCALC 101* 07/31/2013   CREATININE 0.9 12/03/2013     Appointment on 12/03/2013  Component Date Value Ref Range Status  . Hemoglobin A1C 12/03/2013 7.5* 4.6 - 6.5 % Final   Glycemic Control Guidelines for People with Diabetes:Non Diabetic:  <6%Goal of Therapy: <7%Additional Action Suggested:  >8%   . Sodium 12/03/2013 133* 135 - 145 mEq/L Final  . Potassium 12/03/2013 4.4  3.5 - 5.1 mEq/L Final  . Chloride 12/03/2013 99  96 - 112 mEq/L Final  . CO2 12/03/2013 26  19 - 32 mEq/L Final  . Glucose, Bld 12/03/2013 143* 70 - 99 mg/dL Final  . BUN 12/03/2013 24* 6 - 23 mg/dL Final  . Creatinine, Ser 12/03/2013 0.9  0.4 - 1.5 mg/dL Final  . Calcium 12/03/2013 10.0  8.4 - 10.5 mg/dL Final  . GFR 12/03/2013 83.58  >60.00 mL/min Final      Medication List       This list is accurate as of: 12/06/13 11:12 AM.  Always use your most recent med list.               amLODipine 5 MG tablet  Commonly known as:  NORVASC  Take 1 tablet (5 mg total) by mouth daily. TAKE ONE TABLET DAILY IN THE EVENING     aspirin 81 MG tablet  Take 81 mg by mouth daily.     BD INSULIN SYRINGE ULTRAFINE 31G X 5/16" 1 ML Misc  Generic drug:  Insulin Syringe-Needle U-100  by Does not apply route.     ferrous sulfate 325 (65 FE) MG tablet  Take 325 mg by mouth daily with breakfast.     fish oil-omega-3 fatty acids 1000 MG capsule  Take 2 g by mouth daily.     glimepiride 2 MG tablet  Commonly known as:  AMARYL  Take 1 tablet (2 mg total) by mouth daily before breakfast. 1/2 tablet once a day     glucose blood test strip  Commonly known as:  ACCU-CHEK AVIVA  Use as instructed to check blood sugars twice a day dx code 250.00      insulin NPH Human 100 UNIT/ML injection  Commonly known as:  HUMULIN N,NOVOLIN N  Inject 22 Units into the skin. Uses Vial     Insulin Pen Needle 31G X 5 MM Misc  Commonly known as:  B-D UF III MINI PEN NEEDLES  Use 1 pen needle per day     Liraglutide 18 MG/3ML Sopn  Commonly known as:  VICTOZA  Inject 1.2 mg into the skin daily.     meloxicam 15 MG tablet  Commonly known as:  MOBIC     metFORMIN 1000 MG tablet  Commonly known as:  GLUCOPHAGE  Take 1,000 mg by mouth 2 (two) times daily with a meal.     multivitamin with minerals Tabs tablet  Take 1 tablet by mouth daily.     olmesartan-hydrochlorothiazide 40-12.5 MG per tablet  Commonly known as:  BENICAR HCT  Take 1 tablet by mouth daily.     pravastatin 40 MG tablet  Commonly known as:  PRAVACHOL  Take 1 tablet (40 mg total) by mouth daily.     PRESERVISION/LUTEIN Caps  Take by mouth daily.     tamsulosin 0.4 MG Caps capsule  Commonly known as:  FLOMAX  Take 1 capsule (0.4 mg total) by mouth daily.        Allergies:  Allergies  Allergen Reactions  . Morphine And Related Nausea And Vomiting    Past Medical History  Diagnosis Date  . Unspecified essential hypertension   . Pure hypercholesterolemia   . Type II or unspecified type diabetes mellitus without mention of complication, not stated as uncontrolled   . RBBB   . CAD (coronary artery disease)   . Diabetes   . Hyperlipidemia     Past Surgical History  Procedure Laterality Date  . Vein surgery    . Anterior cruciate ligament repair    . Carpal tunnel release      No family history on file.  Social History:  reports that he has never smoked. He has never used smokeless tobacco. His alcohol and drug histories are not on file.  Review of Systems:  HYPERTENSION:  Blood pressure is relatively good today  with his regimen of amlodipine and Benicar HCT Recently not checked at home  HYPERLIPIDEMIA: The lipid abnormality consists of elevated LDL  and he is taking 40 mg Pravachol, no side effects.  Lab Results  Component Value Date   CHOL 176 07/31/2013   HDL 56.50 07/31/2013   LDLCALC 101* 07/31/2013   TRIG 92.0 07/31/2013   CHOLHDL 3 07/31/2013    History of daytime somnolence , previously had refused sleep study. Has had mild snoring; also has late insomnia but does not feel tired on waking up Was prescribed Provigil but this was not covered by insurance  History of overactive bladder for which he takes Enablex only when he is traveling  Mild anemia: He is taking iron supplements 3 times a week and has had no recurrence of anemia   Examination:   BP 144/64  Pulse 66  Temp(Src) 98.4 F (36.9 C)  Resp 16  Ht 5\' 9"  (1.753 m)  Wt 230 lb (104.327 kg)  BMI 33.95 kg/m2  SpO2 97%  Body mass index is 33.95 kg/(m^2).   No ankle edema    ASSESSMENT/ PLAN:   Diabetes type 2 with obesity   The patient's diabetes control appears to be overall somewhat worse with relatively high A1c However has fairly good readings at home except sporadic high postprandial readings. Also has been checking blood sugars twice a day except not after breakfast or lunch; glucose in the lab was fairly good after breakfast He thinks he can do better with his diet and exercise regimen with not traveling as much now Continue Victoza 1.2 mg  HYPERTENSION: Blood pressure is well-controlled    To have complete physical exam on next visit   Coleby Yett 12/06/2013, 11:12 AM

## 2013-12-11 ENCOUNTER — Telehealth: Payer: Self-pay | Admitting: Endocrinology

## 2013-12-11 ENCOUNTER — Other Ambulatory Visit: Payer: Self-pay | Admitting: *Deleted

## 2013-12-11 MED ORDER — INSULIN NPH (HUMAN) (ISOPHANE) 100 UNIT/ML ~~LOC~~ SUSP: 22.0000 [IU] | Freq: Every day | SUBCUTANEOUS | Status: DC

## 2013-12-11 NOTE — Telephone Encounter (Signed)
Pt needs humulin insulin called into pharmacy   Thank You :)

## 2013-12-11 NOTE — Telephone Encounter (Signed)
rx sent

## 2013-12-14 ENCOUNTER — Other Ambulatory Visit: Payer: Self-pay | Admitting: *Deleted

## 2013-12-14 ENCOUNTER — Telehealth: Payer: Self-pay | Admitting: *Deleted

## 2013-12-14 MED ORDER — METFORMIN HCL 1000 MG PO TABS: 1000.0000 mg | ORAL_TABLET | Freq: Two times a day (BID) | ORAL | Status: DC

## 2013-12-14 NOTE — Telephone Encounter (Signed)
rx sent

## 2014-02-19 ENCOUNTER — Other Ambulatory Visit: Payer: Self-pay | Admitting: *Deleted

## 2014-02-19 ENCOUNTER — Telehealth: Payer: Self-pay | Admitting: Endocrinology

## 2014-02-19 MED ORDER — METFORMIN HCL 1000 MG PO TABS: 1000.0000 mg | ORAL_TABLET | Freq: Two times a day (BID) | ORAL | Status: DC

## 2014-02-19 NOTE — Telephone Encounter (Signed)
[facility]   Thank you :)

## 2014-02-19 NOTE — Telephone Encounter (Signed)
rx sent

## 2014-03-04 ENCOUNTER — Other Ambulatory Visit: Payer: Medicare Other

## 2014-03-06 DIAGNOSIS — M25569 Pain in unspecified knee: Secondary | ICD-10-CM | POA: Diagnosis not present

## 2014-03-07 ENCOUNTER — Encounter: Payer: Medicare Other | Admitting: Endocrinology

## 2014-03-11 ENCOUNTER — Other Ambulatory Visit: Payer: Self-pay | Admitting: *Deleted

## 2014-03-11 MED ORDER — GLUCOSE BLOOD VI STRP: ORAL_STRIP | Status: DC

## 2014-03-14 ENCOUNTER — Other Ambulatory Visit (INDEPENDENT_AMBULATORY_CARE_PROVIDER_SITE_OTHER): Payer: Medicare Other

## 2014-03-14 ENCOUNTER — Other Ambulatory Visit: Payer: Self-pay | Admitting: *Deleted

## 2014-03-14 DIAGNOSIS — E785 Hyperlipidemia, unspecified: Secondary | ICD-10-CM | POA: Diagnosis not present

## 2014-03-14 DIAGNOSIS — IMO0001 Reserved for inherently not codable concepts without codable children: Secondary | ICD-10-CM | POA: Diagnosis not present

## 2014-03-14 DIAGNOSIS — D509 Iron deficiency anemia, unspecified: Secondary | ICD-10-CM

## 2014-03-14 DIAGNOSIS — E1165 Type 2 diabetes mellitus with hyperglycemia: Principal | ICD-10-CM

## 2014-03-14 LAB — COMPREHENSIVE METABOLIC PANEL
ALBUMIN: 4 g/dL (ref 3.5–5.2)
ALT: 19 U/L (ref 0–53)
AST: 23 U/L (ref 0–37)
Alkaline Phosphatase: 47 U/L (ref 39–117)
BUN: 25 mg/dL — ABNORMAL HIGH (ref 6–23)
CHLORIDE: 105 meq/L (ref 96–112)
CO2: 24 meq/L (ref 19–32)
Calcium: 10 mg/dL (ref 8.4–10.5)
Creatinine, Ser: 0.9 mg/dL (ref 0.4–1.5)
GFR: 86.74 mL/min (ref >60.00)
Glucose, Bld: 115 mg/dL — ABNORMAL HIGH (ref 70–99)
POTASSIUM: 4.4 meq/L (ref 3.5–5.1)
Sodium: 137 mEq/L (ref 135–145)
TOTAL PROTEIN: 6.5 g/dL (ref 6.0–8.3)
Total Bilirubin: 0.6 mg/dL (ref 0.2–1.2)

## 2014-03-14 LAB — LIPID PANEL
CHOL/HDL RATIO: 3
Cholesterol: 200 mg/dL (ref 0–200)
HDL: 65 mg/dL (ref >39.00)
LDL Cholesterol: 104 mg/dL — ABNORMAL HIGH (ref 0–99)
NONHDL: 135
Triglycerides: 154 mg/dL — ABNORMAL HIGH (ref 0.0–149.0)
VLDL: 30.8 mg/dL (ref 0.0–40.0)

## 2014-03-14 LAB — URINALYSIS, ROUTINE W REFLEX MICROSCOPIC
Bilirubin Urine: NEGATIVE
Hgb urine dipstick: NEGATIVE
KETONES UR: NEGATIVE
Leukocytes, UA: NEGATIVE
NITRITE: NEGATIVE
RBC / HPF: NONE SEEN (ref 0–?)
Specific Gravity, Urine: 1.01 (ref 1.000–1.030)
Total Protein, Urine: NEGATIVE
Urine Glucose: NEGATIVE
Urobilinogen, UA: 0.2 (ref 0.0–1.0)
WBC, UA: NONE SEEN (ref 0–?)
pH: 6.5 (ref 5.0–8.0)

## 2014-03-14 LAB — CBC WITH DIFFERENTIAL/PLATELET
BASOS ABS: 0 10*3/uL (ref 0.0–0.1)
Basophils Relative: 0.4 % (ref 0.0–3.0)
Eosinophils Absolute: 0.2 10*3/uL (ref 0.0–0.7)
Eosinophils Relative: 1.7 % (ref 0.0–5.0)
HEMATOCRIT: 36.2 % — AB (ref 39.0–52.0)
Hemoglobin: 12.1 g/dL — ABNORMAL LOW (ref 13.0–17.0)
LYMPHS ABS: 2.3 10*3/uL (ref 0.7–4.0)
Lymphocytes Relative: 25.1 % (ref 12.0–46.0)
MCHC: 33.6 g/dL (ref 30.0–36.0)
MCV: 91.7 fl (ref 78.0–100.0)
Monocytes Absolute: 0.9 10*3/uL (ref 0.1–1.0)
Monocytes Relative: 9.8 % (ref 3.0–12.0)
Neutro Abs: 5.7 10*3/uL (ref 1.4–7.7)
Neutrophils Relative %: 63 % (ref 43.0–77.0)
PLATELETS: 187 10*3/uL (ref 150.0–400.0)
RBC: 3.94 Mil/uL — ABNORMAL LOW (ref 4.22–5.81)
RDW: 14.6 % (ref 11.5–15.5)
WBC: 9 10*3/uL (ref 4.0–10.5)

## 2014-03-14 LAB — MICROALBUMIN / CREATININE URINE RATIO
Creatinine,U: 41.3 mg/dL
MICROALB UR: 1.2 mg/dL (ref 0.0–1.9)
MICROALB/CREAT RATIO: 2.9 mg/g (ref 0.0–30.0)

## 2014-03-14 LAB — HEMOGLOBIN A1C: HEMOGLOBIN A1C: 7.2 % — AB (ref 4.6–6.5)

## 2014-03-14 MED ORDER — GLUCOSE BLOOD VI STRP: ORAL_STRIP | Status: DC

## 2014-03-18 ENCOUNTER — Ambulatory Visit (INDEPENDENT_AMBULATORY_CARE_PROVIDER_SITE_OTHER): Payer: Medicare Other | Admitting: Endocrinology

## 2014-03-18 ENCOUNTER — Encounter: Payer: Self-pay | Admitting: Endocrinology

## 2014-03-18 VITALS — BP 168/73 | HR 69 | Temp 98.1°F | Resp 16 | Ht 69.0 in | Wt 226.2 lb

## 2014-03-18 DIAGNOSIS — I10 Essential (primary) hypertension: Secondary | ICD-10-CM

## 2014-03-18 DIAGNOSIS — Z0000 Encounter for general adult medical examination without abnormal findings: Secondary | ICD-10-CM

## 2014-03-18 DIAGNOSIS — M1711 Unilateral primary osteoarthritis, right knee: Secondary | ICD-10-CM

## 2014-03-18 DIAGNOSIS — D649 Anemia, unspecified: Secondary | ICD-10-CM

## 2014-03-18 DIAGNOSIS — M1710 Unilateral primary osteoarthritis, unspecified knee: Secondary | ICD-10-CM | POA: Diagnosis not present

## 2014-03-18 DIAGNOSIS — E1165 Type 2 diabetes mellitus with hyperglycemia: Secondary | ICD-10-CM

## 2014-03-18 DIAGNOSIS — IMO0001 Reserved for inherently not codable concepts without codable children: Secondary | ICD-10-CM

## 2014-03-18 DIAGNOSIS — Z23 Encounter for immunization: Secondary | ICD-10-CM | POA: Diagnosis not present

## 2014-03-18 MED ORDER — MELOXICAM 15 MG PO TABS: ORAL_TABLET | ORAL | Status: DC

## 2014-03-18 NOTE — Progress Notes (Signed)
Patient ID: Todd Romero, male   DOB: [birth year], 80 y.o.   MRN: [id number]   Subjective:    Patient is being seen today for Medicare annual wellness visit and review of chronic problems.    Risk factors:  Advancing age, long-standing diabetes, obesity   Roster of Physicians Providing Medical Care to Patient: Self, Dr. Daneen Schick cardiologist  Activities of Daily Living:  In the present state of health, the patient has no difficulty performing the following activities:  Preparing food and eating , Bathing, Getting dressed,  Using the toile:  Moving around from place to place: except with knee pain in the right  In the past year the patient has not fallen or had a near fall    Safety: Has smoke detector and wears seat belts. No excess sun exposure.  Diet and Exercise  Current exercise habits: Temporarily not exercising, previously would be going to the gym 3 times a week Dietary issues discussed: heart healthy diet, carbohydrate modified   Depression Screen:  Q1: Over the past two weeks, have you felt down, depressed or hopeless? no  Q2: Over the past two weeks, have you felt little interest or pleasure in doing things? no   The following portions of the patient's history were reviewed and updated as appropriate: allergies, current medications, past family history, past medical history, past social history, past surgical history and problem list.   Review of Systems  Denies hearing loss, and visual loss. Wife thinks that he has mildly impaired hearing   Diabetes type 2: He did have mild increase in blood sugars when he had steroids in his knee about a week ago, highest reading 205 but otherwise sufficiently glucose has been improved and overall home reading is averaging 123  Having more difficulty with knee joint pain which has been recurrent, getting intra-articular steroid every 6-7 months with relief  Asking about occasional difficulty with dyspnea on exertion which  occurred for a week or 2 and improved spontaneously. No associated chest pain or discomfort and no leg edema. Has not had any significant LV dysfunction previously diagnosed; does have known atherosclerosis of coronaries but cardiac cath report not available  Hypertension: His blood pressure is usually higher in the office and he thinks it is better at about 123456 systolic at home  Memory: No difficulties. No difficulty reading and writing and taking care of his finances  Has had mild chronic anemia which is stable  Objective:    BP 168/73  Pulse 69  Temp(Src) 98.1 F (36.7 C)  Resp 16  Ht 5\' 9"  (1.753 m)  Wt 226 lb 3.2 oz (102.604 kg)  BMI 33.39 kg/m2  SpO2 98%  Vision:  Normal, has annual eye exams in ? 8/14   Hearing: grossly normal Body mass index:  See vitals Msk: pt easily and quickly performs "get-up-and-go" from a sitting position Cognitive Impairment Assessment: cognition, memory and judgment appear normal.  remembers items of conversation during the office visit with excellent recall.     alert and oriented x 3 Diabetic foot exam done today   Assessment:   Medicare wellness evaluation done   Preventive parameters reviewed  Needs Prevnar and this was given today Stool Hemoccult is due and given today  Plan:   During the course of the visit the patient was educated and counseled about appropriate screening and preventive services including:        Fall prevention not an issue        Advised  regular self-examination of his feet   Diabetes management reviewed  Nutrition needs no further adjustment, already eating a heart healthy diet  Lipid screening done  Colorectal screening as above  Regular eye and dental  exams to be continued   Continue 81 mg aspirin   Diabetes: Discussed possibly adding short acting insulin if he has high sugars from steroid in the future  Also would recommend following up with cardiologist if he has any other shortness of  breath on exertion  He will start checking blood pressure regularly and call if high at home also  Vaccines   Zostavax / Pneumococcal Vaccine/ influenza  up-to-date Labs up to date   EKG done as of 06/2013   Patient Instructions (the written plan) was given to the patient.

## 2014-03-18 NOTE — Patient Instructions (Addendum)
1/2 of Meloxicam as needed  Check BP at least weekly

## 2014-03-22 ENCOUNTER — Other Ambulatory Visit: Payer: Self-pay | Admitting: *Deleted

## 2014-03-28 ENCOUNTER — Other Ambulatory Visit: Payer: Self-pay | Admitting: *Deleted

## 2014-03-28 ENCOUNTER — Telehealth: Payer: Self-pay | Admitting: Endocrinology

## 2014-03-28 MED ORDER — INSULIN NPH (HUMAN) (ISOPHANE) 100 UNIT/ML ~~LOC~~ SUSP: 22.0000 [IU] | Freq: Every day | SUBCUTANEOUS | Status: DC

## 2014-03-28 MED ORDER — AMLODIPINE BESYLATE 5 MG PO TABS: 5.0000 mg | ORAL_TABLET | Freq: Every day | ORAL | Status: DC

## 2014-03-28 NOTE — Telephone Encounter (Signed)
90 day supply [facility]  Humulin N Amlodipine

## 2014-03-28 NOTE — Telephone Encounter (Signed)
rxs sent

## 2014-04-04 ENCOUNTER — Other Ambulatory Visit: Payer: Self-pay | Admitting: *Deleted

## 2014-04-04 ENCOUNTER — Telehealth: Payer: Self-pay | Admitting: Endocrinology

## 2014-04-04 MED ORDER — TAMSULOSIN HCL 0.4 MG PO CAPS: 0.4000 mg | ORAL_CAPSULE | Freq: Every day | ORAL | Status: DC

## 2014-04-04 MED ORDER — OLMESARTAN MEDOXOMIL-HCTZ 40-12.5 MG PO TABS: 1.0000 | ORAL_TABLET | Freq: Every day | ORAL | Status: DC

## 2014-04-04 NOTE — Telephone Encounter (Signed)
rx sent

## 2014-04-04 NOTE — Telephone Encounter (Signed)
Patient would like these sent   Tamsulosin Benicar  [facility]   Thank You

## 2014-04-08 ENCOUNTER — Other Ambulatory Visit: Payer: Self-pay | Admitting: *Deleted

## 2014-04-08 MED ORDER — INSULIN PEN NEEDLE 31G X 5 MM MISC: Status: DC

## 2014-04-09 ENCOUNTER — Other Ambulatory Visit: Payer: Self-pay | Admitting: *Deleted

## 2014-04-11 ENCOUNTER — Other Ambulatory Visit: Payer: Self-pay | Admitting: *Deleted

## 2014-04-11 MED ORDER — "INSULIN SYRINGE-NEEDLE U-100 31G X 5/16"" 1 ML MISC": Status: DC

## 2014-04-11 NOTE — Telephone Encounter (Signed)
Patient need a prescription for his syringes for humulin 100 unit injection.

## 2014-04-12 ENCOUNTER — Other Ambulatory Visit (INDEPENDENT_AMBULATORY_CARE_PROVIDER_SITE_OTHER): Payer: Medicare Other

## 2014-04-12 ENCOUNTER — Other Ambulatory Visit: Payer: Self-pay | Admitting: *Deleted

## 2014-04-12 DIAGNOSIS — Z1211 Encounter for screening for malignant neoplasm of colon: Secondary | ICD-10-CM | POA: Diagnosis not present

## 2014-04-12 LAB — FECAL OCCULT BLOOD, IMMUNOCHEMICAL: Fecal Occult Bld: NEGATIVE

## 2014-06-07 ENCOUNTER — Other Ambulatory Visit (INDEPENDENT_AMBULATORY_CARE_PROVIDER_SITE_OTHER): Payer: Medicare Other

## 2014-06-07 DIAGNOSIS — E1165 Type 2 diabetes mellitus with hyperglycemia: Secondary | ICD-10-CM

## 2014-06-07 DIAGNOSIS — D649 Anemia, unspecified: Secondary | ICD-10-CM

## 2014-06-07 DIAGNOSIS — IMO0002 Reserved for concepts with insufficient information to code with codable children: Secondary | ICD-10-CM

## 2014-06-07 LAB — COMPREHENSIVE METABOLIC PANEL
ALT: 16 U/L (ref 0–53)
AST: 20 U/L (ref 0–37)
Albumin: 4 g/dL (ref 3.5–5.2)
Alkaline Phosphatase: 47 U/L (ref 39–117)
BUN: 22 mg/dL (ref 6–23)
CALCIUM: 9.3 mg/dL (ref 8.4–10.5)
CHLORIDE: 104 meq/L (ref 96–112)
CO2: 24 mEq/L (ref 19–32)
CREATININE: 0.9 mg/dL (ref 0.4–1.5)
GFR: 92.6 mL/min (ref >60.00)
Glucose, Bld: 124 mg/dL — ABNORMAL HIGH (ref 70–99)
POTASSIUM: 4.4 meq/L (ref 3.5–5.1)
Sodium: 136 mEq/L (ref 135–145)
TOTAL PROTEIN: 7.2 g/dL (ref 6.0–8.3)
Total Bilirubin: 0.7 mg/dL (ref 0.2–1.2)

## 2014-06-07 LAB — CBC
HCT: 37.2 % — ABNORMAL LOW (ref 39.0–52.0)
Hemoglobin: 12.4 g/dL — ABNORMAL LOW (ref 13.0–17.0)
MCHC: 33.3 g/dL (ref 30.0–36.0)
MCV: 92 fl (ref 78.0–100.0)
PLATELETS: 190 10*3/uL (ref 150.0–400.0)
RBC: 4.04 Mil/uL — ABNORMAL LOW (ref 4.22–5.81)
RDW: 14.8 % (ref 11.5–15.5)
WBC: 7 10*3/uL (ref 4.0–10.5)

## 2014-06-07 LAB — IBC PANEL
Iron: 80 ug/dL (ref 42–165)
Saturation Ratios: 23.5 % (ref 20.0–50.0)
Transferrin: 243.5 mg/dL (ref 212.0–360.0)

## 2014-06-07 LAB — HEMOGLOBIN A1C: HEMOGLOBIN A1C: 7.3 % — AB (ref 4.6–6.5)

## 2014-06-14 ENCOUNTER — Other Ambulatory Visit: Payer: Self-pay | Admitting: *Deleted

## 2014-06-14 ENCOUNTER — Telehealth: Payer: Self-pay | Admitting: *Deleted

## 2014-06-14 MED ORDER — LIRAGLUTIDE 18 MG/3ML ~~LOC~~ SOPN: 1.2000 mg | PEN_INJECTOR | Freq: Every day | SUBCUTANEOUS | Status: DC

## 2014-06-14 NOTE — Telephone Encounter (Signed)
Needs you to call in a RX to mail order [facility] victoza pen injector

## 2014-06-18 ENCOUNTER — Encounter: Payer: Self-pay | Admitting: Endocrinology

## 2014-06-18 ENCOUNTER — Ambulatory Visit (INDEPENDENT_AMBULATORY_CARE_PROVIDER_SITE_OTHER): Payer: Medicare Other | Admitting: Endocrinology

## 2014-06-18 VITALS — BP 138/68 | HR 65 | Temp 98.0°F | Resp 16 | Ht 69.0 in | Wt 233.8 lb

## 2014-06-18 DIAGNOSIS — I10 Essential (primary) hypertension: Secondary | ICD-10-CM

## 2014-06-18 DIAGNOSIS — E1165 Type 2 diabetes mellitus with hyperglycemia: Secondary | ICD-10-CM

## 2014-06-18 DIAGNOSIS — Z23 Encounter for immunization: Secondary | ICD-10-CM

## 2014-06-18 DIAGNOSIS — IMO0002 Reserved for concepts with insufficient information to code with codable children: Secondary | ICD-10-CM

## 2014-06-18 NOTE — Progress Notes (Signed)
Patient ID: Todd Romero, male   DOB: 05-03-1934, 80 y.o.   MRN: [id number]    Reason for Appointment: Diabetes follow-up   History of Present Illness   Diagnosis: Type 2 DIABETES MELITUS, date of diagnosis:  1997   He has been on various regimens for his diabetes and has been on bedtime insulin with NPH since 2005 He also has benefited from adding Victoza in 2011 with better postprandial control and some weight loss Previously his weight has been as much as 247 pounds  On Victoza  1.2 mg since 11/14 with further improvement in blood sugar control.  However in 2015 his blood sugars have been overall highest is by A1c of over 7% consistently He may have some high postprandial readings but not clear if the evening readings are after meals, recently not checked Also he has gained weight which he thinks is from lack of exercise, secondary to his  knee pain  He has gained another 9 pounds Fairly consistent morning readings Only occasional readings over 200     Oral hypoglycemic drugs: Amaryl in the a.m. and metformin        Side effects from medications: None Insulin regimen: NPH 22 units at bedtime           Proper timing of medications in relation to meals: Yes.         Monitors blood glucose: 1.9x a day.    Glucometer:  Accu-Chek         Blood Glucose readings from meter download:   PREMEAL Breakfast pc Lunch  6-7 PM hs  Overall  Glucose range:  78-155   111-86   105-172  156-275 78-275  Mean/median:  115    154   127    Hypoglycemia frequency:  none recently   Meals: 3 meals per day.          Physical activity: exercise: less recently, riding the bike a little Complications: are: None     Wt Readings from Last 3 Encounters:  06/18/14 233 lb 12.8 oz (106.051 kg)  03/18/14 226 lb 3.2 oz (102.604 kg)  12/06/13 230 lb (104.327 kg)   LABS: Lab Results  Component Value Date   HGBA1C 7.3* 06/07/2014   HGBA1C 7.2* 03/14/2014   HGBA1C 7.5* 12/03/2013   Lab Results  Component  Value Date   MICROALBUR 1.2 03/14/2014   LDLCALC 104* 03/14/2014   CREATININE 0.9 06/07/2014     No visits with results within 1 Week(s) from this visit. Latest known visit with results is:  Appointment on 06/07/2014  Component Date Value Ref Range Status  . Hemoglobin A1C 06/07/2014 7.3* 4.6 - 6.5 % Final   Glycemic Control Guidelines for People with Diabetes:Non Diabetic:  <6%Goal of Therapy: <7%Additional Action Suggested:  >8%   . Sodium 06/07/2014 136  135 - 145 mEq/L Final  . Potassium 06/07/2014 4.4  3.5 - 5.1 mEq/L Final  . Chloride 06/07/2014 104  96 - 112 mEq/L Final  . CO2 06/07/2014 24  19 - 32 mEq/L Final  . Glucose, Bld 06/07/2014 124* 70 - 99 mg/dL Final  . BUN 06/07/2014 22  6 - 23 mg/dL Final  . Creatinine, Ser 06/07/2014 0.9  0.4 - 1.5 mg/dL Final  . Total Bilirubin 06/07/2014 0.7  0.2 - 1.2 mg/dL Final  . Alkaline Phosphatase 06/07/2014 47  39 - 117 U/L Final  . AST 06/07/2014 20  0 - 37 U/L Final  . ALT 06/07/2014 16  0 -  53 U/L Final  . Total Protein 06/07/2014 7.2  6.0 - 8.3 g/dL Final  . Albumin 06/07/2014 4.0  3.5 - 5.2 g/dL Final  . Calcium 06/07/2014 9.3  8.4 - 10.5 mg/dL Final  . GFR 06/07/2014 92.60  >60.00 mL/min Final  . WBC 06/07/2014 7.0  4.0 - 10.5 K/uL Final  . RBC 06/07/2014 4.04* 4.22 - 5.81 Mil/uL Final  . Platelets 06/07/2014 190.0  150.0 - 400.0 K/uL Final  . Hemoglobin 06/07/2014 12.4* 13.0 - 17.0 g/dL Final  . HCT 06/07/2014 37.2* 39.0 - 52.0 % Final  . MCV 06/07/2014 92.0  78.0 - 100.0 fl Final  . MCHC 06/07/2014 33.3  30.0 - 36.0 g/dL Final  . RDW 06/07/2014 14.8  11.5 - 15.5 % Final  . Iron 06/07/2014 80  42 - 165 ug/dL Final  . Transferrin 06/07/2014 243.5  212.0 - 360.0 mg/dL Final  . Saturation Ratios 06/07/2014 23.5  20.0 - 50.0 % Final      Medication List       This list is accurate as of: 06/18/14 10:18 AM.  Always use your most recent med list.               amLODipine 5 MG tablet  Commonly known as:  NORVASC  Take 1  tablet (5 mg total) by mouth daily. TAKE ONE TABLET DAILY IN THE EVENING     aspirin 81 MG tablet  Take 81 mg by mouth daily.     ferrous sulfate 325 (65 FE) MG tablet  Take 325 mg by mouth daily with breakfast.     fish oil-omega-3 fatty acids 1000 MG capsule  Take 2 g by mouth daily.     glimepiride 2 MG tablet  Commonly known as:  AMARYL  Take 1 tablet (2 mg total) by mouth daily before breakfast. 1/2 tablet once a day     glucose blood test strip  Commonly known as:  ACCU-CHEK AVIVA  Use as instructed to check blood sugars twice a day dx code 250.00     ibuprofen 200 MG tablet  Commonly known as:  ADVIL,MOTRIN  Take 200 mg by mouth every 6 (six) hours as needed.     insulin NPH Human 100 UNIT/ML injection  Commonly known as:  HUMULIN N,NOVOLIN N  Inject 0.22 mLs (22 Units total) into the skin at bedtime. Uses Vial     Insulin Pen Needle 31G X 5 MM Misc  Commonly known as:  B-D UF III MINI PEN NEEDLES  Use 1 pen needle per day     Insulin Syringe-Needle U-100 31G X 5/16" 1 ML Misc  Commonly known as:  BD INSULIN SYRINGE ULTRAFINE  Use to inject insulin daily     Liraglutide 18 MG/3ML Sopn  Commonly known as:  VICTOZA  Inject 1.2 mg into the skin daily.     meloxicam 15 MG tablet  Commonly known as:  MOBIC  1/2 to 1 tab daily as needed for knee pain     metFORMIN 1000 MG tablet  Commonly known as:  GLUCOPHAGE  Take 1 tablet (1,000 mg total) by mouth 2 (two) times daily with a meal.     multivitamin with minerals Tabs tablet  Take 1 tablet by mouth daily.     olmesartan-hydrochlorothiazide 40-12.5 MG per tablet  Commonly known as:  BENICAR HCT  Take 1 tablet by mouth daily.     pravastatin 40 MG tablet  Commonly known as:  PRAVACHOL  Take 1 tablet (  40 mg total) by mouth daily.     PRESERVISION/LUTEIN Caps  Take by mouth daily.     tamsulosin 0.4 MG Caps capsule  Commonly known as:  FLOMAX  Take 1 capsule (0.4 mg total) by mouth daily.         Allergies:  Allergies  Allergen Reactions  . Morphine And Related Nausea And Vomiting    Past Medical History  Diagnosis Date  . Unspecified essential hypertension   . Pure hypercholesterolemia   . Type II or unspecified type diabetes mellitus without mention of complication, not stated as uncontrolled   . RBBB   . CAD (coronary artery disease)   . Diabetes   . Hyperlipidemia     Past Surgical History  Procedure Laterality Date  . Vein surgery    . Anterior cruciate ligament repair    . Carpal tunnel release      No family history on file.  Social History:  reports that he has never smoked. He has never used smokeless tobacco. His alcohol and drug histories are not on file.  Review of Systems:  HYPERTENSION:  Blood pressure is good today with his regimen of amlodipine and Benicar HCT Recently checked at home: [id number] systolic  HYPERLIPIDEMIA: The lipid abnormality consists of elevated LDL and he is taking 40 mg Pravachol, no side effects.  Lab Results  Component Value Date   CHOL 200 03/14/2014   HDL 65.00 03/14/2014   LDLCALC 104* 03/14/2014   TRIG 154.0* 03/14/2014   CHOLHDL 3 03/14/2014    History of daytime fatigability and somnolence if sitting still Has had mild snoring; also has late insomnia  Had been reluctant to do a sleep study  Lab Results  Component Value Date   TSH 0.66 07/31/2013   May get mild shortness of breath on exertion, he wants to discuss with cardiologist  History of overactive bladder for which he takes Enablex only when he is traveling  Mild anemia: He is taking iron supplements 3 times a week and has had no recurrence of anemia  Lab Results  Component Value Date   WBC 7.0 06/07/2014   HGB 12.4* 06/07/2014   HCT 37.2* 06/07/2014   MCV 92.0 06/07/2014   PLT 190.0 06/07/2014      Examination:   BP 138/68  Pulse 65  Temp(Src) 98 F (36.7 C)  Resp 16  Ht 5\' 9"  (1.753 m)  Wt 233 lb 12.8 oz (106.051 kg)  BMI 34.51 kg/m2  SpO2  97%  Body mass index is 34.51 kg/(m^2).   No ankle edema    ASSESSMENT/ PLAN:   Diabetes type 2 with obesity   The patient's diabetes control appears to be overall fair with relatively high A1c but does not appear to have any consistent high readings at home, checking at various times of the day Not clear if the p.m. readings are after  evening meal  Has had weight gain from lack of exercise and inconsistent diet Discussed importance of losing weight especially with problem with osteoarthritis of knees, possible sleep apnea  For now will not change his regimen  HYPERTENSION: Blood pressure is well-controlled    Mild chronic anemia: He can continue maintenance iron supplements   Again recommended sleep study for his daytime fatigue and somnolence but he wants to wait until after he has cardiac evaluation soon   [facility] 06/18/2014, 10:18 AM

## 2014-07-08 ENCOUNTER — Other Ambulatory Visit: Payer: Self-pay | Admitting: *Deleted

## 2014-07-08 ENCOUNTER — Telehealth: Payer: Self-pay | Admitting: Endocrinology

## 2014-07-08 MED ORDER — PRAVASTATIN SODIUM 40 MG PO TABS: 40.0000 mg | ORAL_TABLET | Freq: Every day | ORAL | Status: DC

## 2014-07-08 NOTE — Telephone Encounter (Signed)
Patient need refill of Pravastain 40 mg

## 2014-07-19 ENCOUNTER — Other Ambulatory Visit: Payer: Self-pay | Admitting: *Deleted

## 2014-07-19 MED ORDER — "INSULIN SYRINGE-NEEDLE U-100 31G X 5/16"" 1 ML MISC": Status: DC

## 2014-07-25 ENCOUNTER — Ambulatory Visit (INDEPENDENT_AMBULATORY_CARE_PROVIDER_SITE_OTHER): Payer: Medicare Other | Admitting: Interventional Cardiology

## 2014-07-25 ENCOUNTER — Encounter: Payer: Self-pay | Admitting: Interventional Cardiology

## 2014-07-25 VITALS — BP 140/86 | HR 63 | Ht 69.0 in | Wt 233.0 lb

## 2014-07-25 DIAGNOSIS — I4510 Unspecified right bundle-branch block: Secondary | ICD-10-CM | POA: Diagnosis not present

## 2014-07-25 DIAGNOSIS — E7800 Pure hypercholesterolemia, unspecified: Secondary | ICD-10-CM

## 2014-07-25 DIAGNOSIS — I10 Essential (primary) hypertension: Secondary | ICD-10-CM | POA: Diagnosis not present

## 2014-07-25 DIAGNOSIS — R0683 Snoring: Secondary | ICD-10-CM

## 2014-07-25 DIAGNOSIS — R0600 Dyspnea, unspecified: Secondary | ICD-10-CM | POA: Diagnosis not present

## 2014-07-25 DIAGNOSIS — I2510 Atherosclerotic heart disease of native coronary artery without angina pectoris: Secondary | ICD-10-CM

## 2014-07-25 NOTE — Progress Notes (Signed)
Patient ID: Trieu Korey Lysne, male   DOB: 1934-08-15, 80 y.o.   MRN: [id number]    [address], [address], [place]  [address] Phone: [phone] Fax:  [phone]  Date:  07/25/2014   ID:  Janice Coffin Careaga, DOB 01/07/1934, MRN [id number]  PCP:  [name], MD   ASSESSMENT:  1. Dyspnea, occurring both at rest and with exertion. Symptoms are relatively vague. I am concerned this may represent sequela of myocardial ischemia, diastolic heart failure, or some other issue. 2. Fatigue, taken into consideration with history of snoring, leads to the possibility of sleep apnea 3. Snoring,? Sleep apnea 4. Coronary artery disease, diagnosed with distal circumflex disease that we have been treating medically since 2013 5. The central hypertension, controlled  PLAN:  1. BNP 2. 2-D Doppler echocardiogram 3. Sleep study 4. One-year follow-up, or earlier if progressive symptoms 5. Of evaluation based upon findings from above acquired database   SUBJECTIVE: Zailen Statzer Mees is a 80 y.o. male who has exertional fatigue. He has dyspnea both at rest and with exertion. His major complaint is that he has no energy to do anything. He has not been dizzy or lightheaded. His mild orthopnea. Mild lower extremity swelling. No chest discomfort. These symptoms have been gradually progressing over the last year. He has not able to exercise because of right knee discomfort.   Wt Readings from Last 3 Encounters:  07/25/14 233 lb (105.688 kg)  06/18/14 233 lb 12.8 oz (106.051 kg)  03/18/14 226 lb 3.2 oz (102.604 kg)     Past Medical History  Diagnosis Date  . Unspecified essential hypertension   . Pure hypercholesterolemia   . Type II or unspecified type diabetes mellitus without mention of complication, not stated as uncontrolled   . RBBB   . CAD (coronary artery disease)   . Diabetes   . Hyperlipidemia     Current Outpatient Prescriptions  Medication Sig Dispense Refill  .  amLODipine (NORVASC) 5 MG tablet Take 1 tablet (5 mg total) by mouth daily. TAKE ONE TABLET DAILY IN THE EVENING 90 tablet 1  . aspirin 81 MG tablet Take 81 mg by mouth daily.    . ferrous sulfate 325 (65 FE) MG tablet Take 325 mg by mouth daily with breakfast.    . fish oil-omega-3 fatty acids 1000 MG capsule Take 2 g by mouth daily.    [name] glimepiride (AMARYL) 2 MG tablet Take 1 tablet (2 mg total) by mouth daily before breakfast. 1/2 tablet once a day 60 tablet 5  . glucose blood (ACCU-CHEK AVIVA) test strip Use as instructed to check blood sugars twice a day dx code 250.00 200 each 3  . ibuprofen (ADVIL,MOTRIN) 200 MG tablet Take 200 mg by mouth every 6 (six) hours as needed.    . insulin NPH Human (HUMULIN N,NOVOLIN N) 100 UNIT/ML injection Inject 0.22 mLs (22 Units total) into the skin at bedtime. Uses Vial 20 mL 3  . Insulin Pen Needle (B-D UF III MINI PEN NEEDLES) 31G X 5 MM MISC Use 1 pen needle per day 30 each 5  . Insulin Syringe-Needle U-100 (BD INSULIN SYRINGE ULTRAFINE) 31G X 5/16" 1 ML MISC Use to inject insulin daily 100 each 5  . Liraglutide (VICTOZA) 18 MG/3ML SOPN Inject 1.2 mg into the skin daily. 6 pen 3  . meloxicam (MOBIC) 15 MG tablet 1/2 to 1 tab daily as needed for knee pain 15 tablet 1  . metFORMIN (GLUCOPHAGE) 1000 MG  tablet Take 1 tablet (1,000 mg total) by mouth 2 (two) times daily with a meal. 180 tablet 1  . Multiple Vitamin (MULTIVITAMIN WITH MINERALS) TABS tablet Take 1 tablet by mouth daily.    . Multiple Vitamins-Minerals (PRESERVISION/LUTEIN) CAPS Take by mouth daily.    [name] olmesartan-hydrochlorothiazide (BENICAR HCT) 40-12.5 MG per tablet Take 1 tablet by mouth daily. 90 tablet 1  . pravastatin (PRAVACHOL) 40 MG tablet Take 1 tablet (40 mg total) by mouth daily. 90 tablet 1  . tamsulosin (FLOMAX) 0.4 MG CAPS capsule Take 1 capsule (0.4 mg total) by mouth daily. 90 capsule 1   No current facility-administered medications for this visit.    Allergies:      Allergies  Allergen Reactions  . Morphine And Related Nausea And Vomiting    Social History:  The patient  reports that he has never smoked. He has never used smokeless tobacco.   ROS:  Please see the history of present illness.   Obese with good appetite. Denies palpitations and transient neurological symptoms. No blood in urine or stool. No claudication.   All other systems reviewed and negative.   OBJECTIVE: VS:  BP 140/86 mmHg  Pulse 63  Ht 5\' 9"  (1.753 m)  Wt 233 lb (105.688 kg)  BMI 34.39 kg/m2 Well nourished, well developed, in no acute distress, obese HEENT: normal Neck: JVD flat. Carotid bruit absent  Cardiac:  normal S1, S2; RRR; no murmur Lungs:  clear to auscultation bilaterally, no wheezing, rhonchi or rales Abd: soft, nontender, no hepatomegaly Ext: Edema trace bilateral. Pulses 2+ Skin: warm and dry Neuro:  CNs 2-12 intact, no focal abnormalities noted  EKG:  Right bundle with left axis deviation unchanged from prior       Signed, Illene Labrador III, MD 07/25/2014 4:12 PM

## 2014-07-25 NOTE — Patient Instructions (Signed)
Your physician recommends that you continue on your current medications as directed. Please refer to the Current Medication list given to you today.  Lab Today  Your physician has requested that you have an echocardiogram. Echocardiography is a painless test that uses sound waves to create images of your heart. It provides your doctor with information about the size and shape of your heart and how well your heart's chambers and valves are working. This procedure takes approximately one hour. There are no restrictions for this procedure.  Your physician has recommended that you have a sleep study. This test records several body functions during sleep, including: brain activity, eye movement, oxygen and carbon dioxide blood levels, heart rate and rhythm, breathing rate and rhythm, the flow of air through your mouth and nose, snoring, body muscle movements, and chest and belly movement.  Your physician wants you to follow-up in: 1 year You will receive a reminder letter in the mail two months in advance. If you don't receive a letter, please call our office to schedule the follow-up appointment.

## 2014-07-26 LAB — BRAIN NATRIURETIC PEPTIDE: Pro B Natriuretic peptide (BNP): 22 pg/mL (ref 0.0–100.0)

## 2014-07-30 DIAGNOSIS — H43813 Vitreous degeneration, bilateral: Secondary | ICD-10-CM | POA: Diagnosis not present

## 2014-07-30 DIAGNOSIS — H33002 Unspecified retinal detachment with retinal break, left eye: Secondary | ICD-10-CM | POA: Diagnosis not present

## 2014-07-30 DIAGNOSIS — E119 Type 2 diabetes mellitus without complications: Secondary | ICD-10-CM | POA: Diagnosis not present

## 2014-08-05 ENCOUNTER — Ambulatory Visit (HOSPITAL_COMMUNITY): Payer: Medicare Other | Attending: Interventional Cardiology | Admitting: Radiology

## 2014-08-05 DIAGNOSIS — E119 Type 2 diabetes mellitus without complications: Secondary | ICD-10-CM | POA: Diagnosis not present

## 2014-08-05 DIAGNOSIS — R0600 Dyspnea, unspecified: Secondary | ICD-10-CM | POA: Diagnosis not present

## 2014-08-05 DIAGNOSIS — I4510 Unspecified right bundle-branch block: Secondary | ICD-10-CM | POA: Insufficient documentation

## 2014-08-05 DIAGNOSIS — E785 Hyperlipidemia, unspecified: Secondary | ICD-10-CM | POA: Diagnosis not present

## 2014-08-05 DIAGNOSIS — I10 Essential (primary) hypertension: Secondary | ICD-10-CM | POA: Diagnosis not present

## 2014-08-05 NOTE — Progress Notes (Signed)
Echocardiogram performed.  

## 2014-08-08 ENCOUNTER — Telehealth: Payer: Self-pay

## 2014-08-08 MED ORDER — METOPROLOL SUCCINATE ER 50 MG PO TB24: 50.0000 mg | ORAL_TABLET | Freq: Every day | ORAL | Status: DC

## 2014-08-08 NOTE — Telephone Encounter (Signed)
Pt aware of echo results and Dr.Smith recommendations. There is moderate aortic stenosis. Mild left ventricular hypertrophy. Normal systolic function. Diastolic dysfunction is present. The aortic valve need serial follow-up. An echo needs to be repeated in one year. Shortness of breath is multifactorial.   Rx sent to pt pharmacy.f/u appt scheduled for 10/11/14 @ 10:15am.pt adv to call if symptoms develop after starting Metoprolol.  Pt  Verbalized understanding.

## 2014-08-08 NOTE — Telephone Encounter (Signed)
-----   Message from [place], MD sent at 08/07/2014  9:12 AM EST ----- There is moderate aortic stenosis. Mild left ventricular hypertrophy. Normal systolic function. Diastolic dysfunction is present. The aortic valve need serial follow-up. An echo needs to be repeated in one year. Shortness of breath is multifactorial.

## 2014-08-08 NOTE — Telephone Encounter (Signed)
-----   Message from [place], MD sent at 08/07/2014  9:12 AM EST ----- There is moderate aortic stenosis. Mild left ventricular hypertrophy. Normal systolic function. Diastolic dysfunction is present. The aortic valve need serial follow-up. An echo needs to be repeated in one year. Shortness of breath is multifactorial.

## 2014-08-08 NOTE — Telephone Encounter (Signed)
F/u ° ° °Pt returning your call °

## 2014-08-08 NOTE — Telephone Encounter (Signed)
Called to give pt echo results and Dr.Smith recommendations.lmtcb

## 2014-08-08 NOTE — Telephone Encounter (Signed)
Pt aware of echo results and Dr.Smith recommendations. There is moderate aortic stenosis. Mild left ventricular hypertrophy. Normal systolic function. Diastolic dysfunction is present. The aortic valve need serial follow-up. An echo needs to be repeated in one year. Shortness of breath is multifactorial.   Rx sent to pt pharmacy.f/u appt scheduled for 10/11/14 @ 10:15am.pt adv to call if symptoms develop after starting Metoprolol. Pt Verbalized understanding.

## 2014-08-23 ENCOUNTER — Telehealth: Payer: Self-pay | Admitting: Endocrinology

## 2014-08-23 ENCOUNTER — Other Ambulatory Visit: Payer: Self-pay | Admitting: *Deleted

## 2014-08-23 MED ORDER — GLIMEPIRIDE 2 MG PO TABS: ORAL_TABLET | ORAL | Status: DC

## 2014-08-23 MED ORDER — METFORMIN HCL 1000 MG PO TABS: 1000.0000 mg | ORAL_TABLET | Freq: Two times a day (BID) | ORAL | Status: DC

## 2014-08-23 NOTE — Telephone Encounter (Signed)
rx sent

## 2014-08-23 NOTE — Telephone Encounter (Signed)
Patient need refill of Glimepride 2 mg, Metformin 1000 mg

## 2014-09-04 DIAGNOSIS — E119 Type 2 diabetes mellitus without complications: Secondary | ICD-10-CM | POA: Diagnosis not present

## 2014-09-04 DIAGNOSIS — H2513 Age-related nuclear cataract, bilateral: Secondary | ICD-10-CM | POA: Diagnosis not present

## 2014-09-04 DIAGNOSIS — H40013 Open angle with borderline findings, low risk, bilateral: Secondary | ICD-10-CM | POA: Diagnosis not present

## 2014-09-04 DIAGNOSIS — H3531 Nonexudative age-related macular degeneration: Secondary | ICD-10-CM | POA: Diagnosis not present

## 2014-09-04 DIAGNOSIS — H25013 Cortical age-related cataract, bilateral: Secondary | ICD-10-CM | POA: Diagnosis not present

## 2014-09-23 ENCOUNTER — Other Ambulatory Visit: Payer: Self-pay | Admitting: *Deleted

## 2014-09-23 MED ORDER — AMLODIPINE BESYLATE 5 MG PO TABS: ORAL_TABLET | ORAL | Status: DC

## 2014-09-30 ENCOUNTER — Other Ambulatory Visit: Payer: Self-pay | Admitting: *Deleted

## 2014-09-30 ENCOUNTER — Telehealth: Payer: Self-pay | Admitting: Endocrinology

## 2014-09-30 MED ORDER — TAMSULOSIN HCL 0.4 MG PO CAPS: 0.4000 mg | ORAL_CAPSULE | Freq: Every day | ORAL | Status: DC

## 2014-09-30 MED ORDER — "INSULIN SYRINGE-NEEDLE U-100 31G X 5/16"" 1 ML MISC": Status: DC

## 2014-09-30 NOTE — Telephone Encounter (Signed)
Patient called and would [facility]  Tamsulosin 0.4 mg   [facility]    Thank you

## 2014-09-30 NOTE — Telephone Encounter (Signed)
rx sent

## 2014-10-11 ENCOUNTER — Ambulatory Visit (INDEPENDENT_AMBULATORY_CARE_PROVIDER_SITE_OTHER): Payer: Medicare Other | Admitting: Interventional Cardiology

## 2014-10-11 ENCOUNTER — Encounter: Payer: Self-pay | Admitting: Interventional Cardiology

## 2014-10-11 VITALS — BP 170/88 | HR 59 | Ht 69.0 in | Wt 238.2 lb

## 2014-10-11 DIAGNOSIS — I2510 Atherosclerotic heart disease of native coronary artery without angina pectoris: Secondary | ICD-10-CM

## 2014-10-11 DIAGNOSIS — I10 Essential (primary) hypertension: Secondary | ICD-10-CM | POA: Diagnosis not present

## 2014-10-11 DIAGNOSIS — E785 Hyperlipidemia, unspecified: Secondary | ICD-10-CM | POA: Diagnosis not present

## 2014-10-11 DIAGNOSIS — I4510 Unspecified right bundle-branch block: Secondary | ICD-10-CM | POA: Diagnosis not present

## 2014-10-11 MED ORDER — METOPROLOL SUCCINATE ER 25 MG PO TB24: 25.0000 mg | ORAL_TABLET | Freq: Every day | ORAL | Status: DC

## 2014-10-11 MED ORDER — AMLODIPINE BESYLATE 10 MG PO TABS: ORAL_TABLET | ORAL | Status: DC

## 2014-10-11 NOTE — Progress Notes (Signed)
Patient ID: Todd Romero, male   DOB: Oct 03, 1933, 81 y.o.   MRN: [id number]    Cardiology Office Note   Date:  10/11/2014   ID:  Todd Romero, DOB Jan 19, 1934, MRN [id number]  PCP:  [name], MD  Cardiologist:   Sinclair Grooms, MD   No chief complaint on file.     History of Present Illness: [name] is a 81 y.o. male who presents for CAD and complains of dyspnea from time to time.This is a deterioration in function. He denies chest discomfort. He is limited by arthritis. He is taking a lot of ibuprofen. Is no peripheral edema.    Past Medical History  Diagnosis Date  . Unspecified essential hypertension   . Pure hypercholesterolemia   . Type II or unspecified type diabetes mellitus without mention of complication, not stated as uncontrolled   . RBBB   . CAD (coronary artery disease)   . Diabetes   . Hyperlipidemia     Past Surgical History  Procedure Laterality Date  . Vein surgery    . Anterior cruciate ligament repair    . Carpal tunnel release       Current Outpatient Prescriptions  Medication Sig Dispense Refill  . amLODipine (NORVASC) 5 MG tablet TAKE ONE TABLET DAILY IN THE EVENING 90 tablet 1  . aspirin 81 MG tablet Take 81 mg by mouth daily.    . ferrous sulfate 325 (65 FE) MG tablet Take 325 mg by mouth daily with breakfast.    . fish oil-omega-3 fatty acids 1000 MG capsule Take 2 g by mouth daily.    [name] glimepiride (AMARYL) 2 MG tablet 1/2 tablet once a day 45 tablet 3  . glucose blood (ACCU-CHEK AVIVA) test strip Use as instructed to check blood sugars twice a day dx code 250.00 200 each 3  . ibuprofen (ADVIL,MOTRIN) 200 MG tablet Take 200 mg by mouth every 6 (six) hours as needed.    . insulin NPH Human (HUMULIN N,NOVOLIN N) 100 UNIT/ML injection Inject 0.22 mLs (22 Units total) into the skin at bedtime. Uses Vial 20 mL 3  . Insulin Pen Needle (B-D UF III MINI PEN NEEDLES) 31G X 5 MM MISC Use 1 pen needle per day 30 each 5  .  Insulin Syringe-Needle U-100 (BD INSULIN SYRINGE ULTRAFINE) 31G X 5/16" 1 ML MISC Use to inject insulin daily 100 each 5  . Liraglutide (VICTOZA) 18 MG/3ML SOPN Inject 1.2 mg into the skin daily. 6 pen 3  . meloxicam (MOBIC) 15 MG tablet 1/2 to 1 tab daily as needed for knee pain 15 tablet 1  . metFORMIN (GLUCOPHAGE) 1000 MG tablet Take 1 tablet (1,000 mg total) by mouth 2 (two) times daily with a meal. 180 tablet 1  . metoprolol succinate (TOPROL-XL) 50 MG 24 hr tablet Take 1 tablet (50 mg total) by mouth daily. Take with or immediately following a meal. 90 tablet 3  . Multiple Vitamin (MULTIVITAMIN WITH MINERALS) TABS tablet Take 1 tablet by mouth daily.    . Multiple Vitamins-Minerals (PRESERVISION/LUTEIN) CAPS Take by mouth daily.    [name] olmesartan-hydrochlorothiazide (BENICAR HCT) 40-12.5 MG per tablet Take 1 tablet by mouth daily. 90 tablet 1  . pravastatin (PRAVACHOL) 40 MG tablet Take 1 tablet (40 mg total) by mouth daily. 90 tablet 1  . tamsulosin (FLOMAX) 0.4 MG CAPS capsule Take 1 capsule (0.4 mg total) by mouth daily. 90 capsule 1   No current facility-administered medications for this visit.  Allergies:   Morphine and related    Social History:  The patient  reports that he has never smoked. He has never used smokeless tobacco.   Family History:  The patient's family history includes Heart failure in his father.    ROS:  Please see the history of present illness.   Otherwise, review of systems are positive for right knee discomfort.   All other systems are reviewed and negative.    PHYSICAL EXAM: VS:  BP 170/88 mmHg  Pulse 59  Ht 5\' 9"  (1.753 m)  Wt 238 lb 3.2 oz (108.047 kg)  BMI 35.16 kg/m2  SpO2 99% , BMI Body mass index is 35.16 kg/(m^2). GEN: Well nourished, well developed, in no acute distress HEENT: normal Neck: no JVD, carotid bruits, or masses Cardiac: RRR; no murmurs, rubs, or gallops,no edema  Respiratory:  clear to auscultation bilaterally, normal work of  breathing GI: soft, nontender, nondistended, + BS MS: no deformity or atrophy Skin: warm and dry, no rash Neuro:  Strength and sensation are intact Psych: euthymic mood, full affect   EKG:  EKG is not ordered today. The ekg ordered today demonstrates    Recent Labs: 06/07/2014: ALT 16; BUN 22; Creatinine 0.9; Hemoglobin 12.4*; Platelets 190.0; Potassium 4.4; Sodium 136 07/25/2014: Pro B Natriuretic peptide (BNP) 22.0    Lipid Panel    Component Value Date/Time   CHOL 200 03/14/2014 0826   TRIG 154.0* 03/14/2014 0826   HDL 65.00 03/14/2014 0826   CHOLHDL 3 03/14/2014 0826   VLDL 30.8 03/14/2014 0826   LDLCALC 104* 03/14/2014 0826      Wt Readings from Last 3 Encounters:  10/11/14 238 lb 3.2 oz (108.047 kg)  07/25/14 233 lb (105.688 kg)  06/18/14 233 lb 12.8 oz (106.051 kg)      Other studies Reviewed: Additional studies/ records that were reviewed today include: . Review of the above records demonstrates:    ASSESSMENT AND PLAN:  1. Dyspnea and exertional fatigue. 2. Essential hypertension, Poorly controlled 3. Moderate aortic stenosis by echo 4. Distal circumflex coronary artery disease, asymptomatic with reference to angina 5. Possible sleep apnea   Current medicines are reviewed at length with the patient today.  The patient has concerns regarding medicines. He feels worse on metoprolol.  The following changes have been made:  Increase amlodipine to 10 mg daily (wife for peripheral edema); decreased metoprolol succinate to 25 mg daily; decrease ibuprofen and eliminate if possible as this is likely aggravating blood pressure control  Labs/ tests ordered today include:  No orders of the defined types were placed in this encounter.     Disposition:   FU with Linard Millers  in 3 months   Signed, Sinclair Grooms, MD  10/11/2014 11:25 AM    [address], [place], [place]  [address] Phone: [phone]; Fax: [phone]

## 2014-10-11 NOTE — Patient Instructions (Signed)
Your physician has recommended you make the following change in your medication:  1) DECREASE Metoprolol to 25mg  daily 2) INCREASE Amlodipine to 10mg  daily  Take all other medications as prescribed  Your physician recommends that you schedule a follow-up appointment in: 3 months with Dr.Smith

## 2014-10-14 ENCOUNTER — Other Ambulatory Visit: Payer: Self-pay | Admitting: *Deleted

## 2014-10-14 ENCOUNTER — Ambulatory Visit (HOSPITAL_BASED_OUTPATIENT_CLINIC_OR_DEPARTMENT_OTHER): Payer: Medicare Other | Attending: Cardiology

## 2014-10-14 ENCOUNTER — Other Ambulatory Visit (INDEPENDENT_AMBULATORY_CARE_PROVIDER_SITE_OTHER): Payer: Medicare Other

## 2014-10-14 VITALS — Ht 69.0 in | Wt 234.0 lb

## 2014-10-14 DIAGNOSIS — G4719 Other hypersomnia: Secondary | ICD-10-CM | POA: Diagnosis not present

## 2014-10-14 DIAGNOSIS — G4733 Obstructive sleep apnea (adult) (pediatric): Secondary | ICD-10-CM | POA: Diagnosis not present

## 2014-10-14 DIAGNOSIS — R0683 Snoring: Secondary | ICD-10-CM | POA: Diagnosis present

## 2014-10-14 DIAGNOSIS — E785 Hyperlipidemia, unspecified: Secondary | ICD-10-CM | POA: Diagnosis not present

## 2014-10-14 DIAGNOSIS — E1165 Type 2 diabetes mellitus with hyperglycemia: Secondary | ICD-10-CM

## 2014-10-14 DIAGNOSIS — E119 Type 2 diabetes mellitus without complications: Secondary | ICD-10-CM | POA: Diagnosis not present

## 2014-10-14 DIAGNOSIS — IMO0002 Reserved for concepts with insufficient information to code with codable children: Secondary | ICD-10-CM

## 2014-10-14 DIAGNOSIS — I10 Essential (primary) hypertension: Secondary | ICD-10-CM

## 2014-10-14 LAB — COMPREHENSIVE METABOLIC PANEL
ALBUMIN: 4 g/dL (ref 3.5–5.2)
ALK PHOS: 56 U/L (ref 39–117)
ALT: 12 U/L (ref 0–53)
AST: 14 U/L (ref 0–37)
BILIRUBIN TOTAL: 0.5 mg/dL (ref 0.2–1.2)
BUN: 19 mg/dL (ref 6–23)
CALCIUM: 10 mg/dL (ref 8.4–10.5)
CO2: 26 mEq/L (ref 19–32)
Chloride: 101 mEq/L (ref 96–112)
Creatinine, Ser: 0.94 mg/dL (ref 0.40–1.50)
GFR: 82.37 mL/min (ref >60.00)
Glucose, Bld: 125 mg/dL — ABNORMAL HIGH (ref 70–99)
Potassium: 4.3 mEq/L (ref 3.5–5.1)
SODIUM: 135 meq/L (ref 135–145)
Total Protein: 6.9 g/dL (ref 6.0–8.3)

## 2014-10-14 LAB — BASIC METABOLIC PANEL
BUN: 19 mg/dL (ref 6–23)
CO2: 26 meq/L (ref 19–32)
CREATININE: 0.94 mg/dL (ref 0.40–1.50)
Calcium: 10 mg/dL (ref 8.4–10.5)
Chloride: 101 mEq/L (ref 96–112)
GFR: 82.37 mL/min (ref >60.00)
Glucose, Bld: 125 mg/dL — ABNORMAL HIGH (ref 70–99)
Potassium: 4.3 mEq/L (ref 3.5–5.1)
SODIUM: 135 meq/L (ref 135–145)

## 2014-10-14 LAB — MICROALBUMIN / CREATININE URINE RATIO
Creatinine,U: 50.3 mg/dL
Microalb Creat Ratio: 8.9 mg/g (ref 0.0–30.0)
Microalb, Ur: 4.5 mg/dL — ABNORMAL HIGH (ref 0.0–1.9)

## 2014-10-14 LAB — HEMOGLOBIN A1C: Hgb A1c MFr Bld: 7.5 % — ABNORMAL HIGH (ref 4.6–6.5)

## 2014-10-14 MED ORDER — OLMESARTAN MEDOXOMIL-HCTZ 40-12.5 MG PO TABS: 1.0000 | ORAL_TABLET | Freq: Every day | ORAL | Status: DC

## 2014-10-15 ENCOUNTER — Telehealth: Payer: Self-pay | Admitting: Cardiology

## 2014-10-15 ENCOUNTER — Other Ambulatory Visit: Payer: Self-pay | Admitting: *Deleted

## 2014-10-15 ENCOUNTER — Telehealth: Payer: Self-pay | Admitting: Endocrinology

## 2014-10-15 MED ORDER — OLMESARTAN MEDOXOMIL-HCTZ 40-12.5 MG PO TABS: 1.0000 | ORAL_TABLET | Freq: Every day | ORAL | Status: DC

## 2014-10-15 NOTE — Telephone Encounter (Signed)
Pt needs benecar [facility] # [id number]

## 2014-10-15 NOTE — Telephone Encounter (Signed)
[facility]

## 2014-10-15 NOTE — Sleep Study (Addendum)
   NAME: Todd Romero DATE OF BIRTH:  [birth year] MEDICAL RECORD NUMBER [id number]  LOCATION: [facility]  PHYSICIAN: Elisha Mcgruder R  DATE OF STUDY: 10/14/2014  SLEEP STUDY TYPE: Nocturnal Polysomnogram               REFERRING PHYSICIAN: Daneen Schick, MD  INDICATION FOR STUDY: snoring, excessive daytime sleepiness  EPWORTH SLEEPINESS SCORE: 9 HEIGHT: 5\' 9"  (175.3 cm)  WEIGHT: 234 lb (106.142 kg)    Body mass index is 34.54 kg/(m^2).  NECK SIZE: 17.5 in.  MEDICATIONS: Reviewed in the chart  SLEEP ARCHITECTURE: The patient slept for a total of 164 minutes out of a total recording time of 386 minutes.  There was no slow wave sleep and 32 minutes or REM sleep.  The onset to sleep latency was normal at 9 minutes and onset to REM sleep latency was prolonged at 207 minutes.  The sleep efficiency was reduced at 42% and sleep maintenance efficiency was markedly reduced at 11%.  There were frequent arousals from sleep due to respiratory events.    RESPIRATORY DATA: There was a total of 9 apneas, of which, 1 was obstructive, 8 were central and 1 was a mixed apnea.  There were 65 obstructive hypopneas noted.  Most events occurred during NREM sleep in the nonsupine position. The AHI was 27 events per hour consistent with severe obstructive sleep apnea/hypopnea syndrome.  There was mild to moderate snoring.    OXYGEN DATA: The lowest oxygen desaturation was 79% during REM sleep and time spent with O2 saturations lower than 88% was 6 minutes.   CARDIAC DATA: The patient maintained NSR with PAC's and PVC's during the study.  The average HR was 54 bpm and range was 30 to 122 bpm.    MOVEMENT/PARASOMNIA: There were a increased number of periodic limb movements noted for an elevated PLMS index of 18 movements per hour.  There were no REM behavior sleep disorders.  IMPRESSION/ RECOMMENDATION:   1.  Severe obstructive sleep apnea/hypopnea syndrome with an AHI of 27 events per  hour.  Most events occurred in REM sleep in the non supine position.   2.  Reduced sleep efficiency with increased frequency of arousals due to respiratory events. 3.  Abnormal sleep architecture with no slow wave sleep and prolonged latency to REM sleep onset. 4.  Oxygen desaturations as low as 79% during respiratory events. 5.  Increased periodic limb movements with an increased PLMS index of 18 movements per hour.  6.  Occasional PAC's and PVCs were noted during the study.   7.  Given the degree of obstructive sleep apnea and the patient's symptoms of excessive daytime sleepiness and oxygen desaturations during sleep, recommend in lab CPAP titration. 8.  The patient should be counseled in good sleep hygiene and weight loss.  Signed: Sueanne Margarita Diplomate, American Board of Sleep Medicine  ELECTRONICALLY SIGNED ON:  10/15/2014, 9:32 PM [place] PH: [phone]   FX: [phone] [place]

## 2014-10-15 NOTE — Telephone Encounter (Signed)
Please let patient know that he has severe OSA and set up for in lab CPAP titration.

## 2014-10-16 NOTE — Telephone Encounter (Signed)
Left message to call back  

## 2014-10-17 ENCOUNTER — Encounter: Payer: Self-pay | Admitting: Interventional Cardiology

## 2014-10-17 NOTE — Telephone Encounter (Signed)
This encounter was created in error - please disregard.

## 2014-10-17 NOTE — Telephone Encounter (Signed)
Follow Up    Pt returning call from yesterday. Please call.

## 2014-10-17 NOTE — Telephone Encounter (Signed)
Instructed patient's wife I will call this afternoon.

## 2014-10-17 NOTE — Telephone Encounter (Signed)
Patient informed of results and verbal understanding expressed.  Patient refuses CPAP titration at this time. He will call back if he changes his mind.

## 2014-10-18 ENCOUNTER — Encounter: Payer: Self-pay | Admitting: Endocrinology

## 2014-10-18 ENCOUNTER — Ambulatory Visit (INDEPENDENT_AMBULATORY_CARE_PROVIDER_SITE_OTHER): Payer: Medicare Other | Admitting: Endocrinology

## 2014-10-18 VITALS — BP 144/66 | HR 65 | Temp 98.3°F | Resp 14 | Ht 69.0 in | Wt 232.2 lb

## 2014-10-18 DIAGNOSIS — G4733 Obstructive sleep apnea (adult) (pediatric): Secondary | ICD-10-CM | POA: Diagnosis not present

## 2014-10-18 DIAGNOSIS — I2510 Atherosclerotic heart disease of native coronary artery without angina pectoris: Secondary | ICD-10-CM

## 2014-10-18 DIAGNOSIS — E1165 Type 2 diabetes mellitus with hyperglycemia: Secondary | ICD-10-CM

## 2014-10-18 DIAGNOSIS — I10 Essential (primary) hypertension: Secondary | ICD-10-CM

## 2014-10-18 DIAGNOSIS — D509 Iron deficiency anemia, unspecified: Secondary | ICD-10-CM

## 2014-10-18 DIAGNOSIS — E785 Hyperlipidemia, unspecified: Secondary | ICD-10-CM | POA: Diagnosis not present

## 2014-10-18 DIAGNOSIS — IMO0002 Reserved for concepts with insufficient information to code with codable children: Secondary | ICD-10-CM

## 2014-10-18 NOTE — Patient Instructions (Addendum)
Victoza 1.8mg  daily  Please check blood sugars at least half the time about 2 hours after any meal and 3 times per week on waking up. Please bring blood sugar monitor to each visit. Recommended blood sugar levels about 2 hours after meal is 140-180 and on waking up 90-130  Check BP weekly

## 2014-10-18 NOTE — Progress Notes (Signed)
Patient ID: Todd Romero, male   DOB: 28-Apr-1934, 81 y.o.   MRN: [id number]    Reason for Appointment: Diabetes follow-up   History of Present Illness   Diagnosis: Type 2 DIABETES MELITUS, date of diagnosis:  1997   He has been on various regimens for his diabetes and has been on bedtime insulin with NPH since 2005 He also has benefited from adding Victoza in 2011 with better postprandial control and some weight loss Previously his weight has been as much as 247 pounds  On Victoza 1.2 mg since 11/14 with further improvement in blood sugar control.  However since 2015 his blood sugars have been overall high with A1c  over 7% consistently. He thinks that some of his high readings are because of inconsistent diet as well as not exercising as much A1c is now 7.5 However looking at his blood sugars he has only some high readings after meals and overall average sugars after evening meal are not significantly high. No side effects from Victoza Has however been able to keep his weight stable since his last visit Fairly consistent morning readings His occasional readings over 200 are Usually  from more carbohydrate or eating out     No hypoglycemia with NPH at bedtime Oral hypoglycemic drugs: Amaryl in the a.m. and metformin        Side effects from medications: None Insulin regimen: NPH 22 units at bedtime           Proper timing of medications in relation to meals: Yes.         Monitors blood glucose: 1.9x a day.    Glucometer:  Accu-Chek         Blood Glucose readings from meter download:   PRE-MEAL Breakfast Lunch Dinner Bedtime Overall  Glucose range:  86-136   105   87-240   102-233   85-240  Mean/median:  106     154   126    Meals: 3 meals per day.          Physical activity: exercise: less recently, riding the bike a little, limited by shortness of breath    Wt Readings from Last 3 Encounters:  10/18/14 232 lb 3.2 oz (105.325 kg)  10/14/14 234 lb (106.142 kg)  10/11/14  238 lb 3.2 oz (108.047 kg)   LABS: Lab Results  Component Value Date   HGBA1C 7.5* 10/14/2014   HGBA1C 7.3* 06/07/2014   HGBA1C 7.2* 03/14/2014   Lab Results  Component Value Date   MICROALBUR 4.5* 10/14/2014   LDLCALC 104* 03/14/2014   CREATININE 0.94 10/14/2014   CREATININE 0.94 10/14/2014     Appointment on 10/14/2014  Component Date Value Ref Range Status  . Hgb A1c MFr Bld 10/14/2014 7.5* 4.6 - 6.5 % Final   Glycemic Control Guidelines for People with Diabetes:Non Diabetic:  <6%Goal of Therapy: <7%Additional Action Suggested:  >8%   . Sodium 10/14/2014 135  135 - 145 mEq/L Final  . Potassium 10/14/2014 4.3  3.5 - 5.1 mEq/L Final  . Chloride 10/14/2014 101  96 - 112 mEq/L Final  . CO2 10/14/2014 26  19 - 32 mEq/L Final  . Glucose, Bld 10/14/2014 125* 70 - 99 mg/dL Final  . BUN 10/14/2014 19  6 - 23 mg/dL Final  . Creatinine, Ser 10/14/2014 0.94  0.40 - 1.50 mg/dL Final  . Calcium 10/14/2014 10.0  8.4 - 10.5 mg/dL Final  . GFR 10/14/2014 82.37  >60.00 mL/min Final  . Sodium 10/14/2014 135  135 - 145 mEq/L Final  . Potassium 10/14/2014 4.3  3.5 - 5.1 mEq/L Final  . Chloride 10/14/2014 101  96 - 112 mEq/L Final  . CO2 10/14/2014 26  19 - 32 mEq/L Final  . Glucose, Bld 10/14/2014 125* 70 - 99 mg/dL Final  . BUN 10/14/2014 19  6 - 23 mg/dL Final  . Creatinine, Ser 10/14/2014 0.94  0.40 - 1.50 mg/dL Final  . Total Bilirubin 10/14/2014 0.5  0.2 - 1.2 mg/dL Final  . Alkaline Phosphatase 10/14/2014 56  39 - 117 U/L Final  . AST 10/14/2014 14  0 - 37 U/L Final  . ALT 10/14/2014 12  0 - 53 U/L Final  . Total Protein 10/14/2014 6.9  6.0 - 8.3 g/dL Final  . Albumin 10/14/2014 4.0  3.5 - 5.2 g/dL Final  . Calcium 10/14/2014 10.0  8.4 - 10.5 mg/dL Final  . GFR 10/14/2014 82.37  >60.00 mL/min Final  . Microalb, Ur 10/14/2014 4.5* 0.0 - 1.9 mg/dL Final  . Creatinine,U 10/14/2014 50.3   Final  . Microalb Creat Ratio 10/14/2014 8.9  0.0 - 30.0 mg/g Final      Medication List        This list is accurate as of: 10/18/14 10:27 AM.  Always use your most recent med list.               amLODipine 5 MG tablet  Commonly known as:  NORVASC     amLODipine 10 MG tablet  Commonly known as:  NORVASC  TAKE ONE TABLET DAILY IN THE EVENING     aspirin 81 MG tablet  Take 81 mg by mouth daily.     ferrous sulfate 325 (65 FE) MG tablet  Take 325 mg by mouth daily with breakfast.     fish oil-omega-3 fatty acids 1000 MG capsule  Take 2 g by mouth daily.     glimepiride 2 MG tablet  Commonly known as:  AMARYL  1/2 tablet once a day     glucose blood test strip  Commonly known as:  ACCU-CHEK AVIVA  Use as instructed to check blood sugars twice a day dx code 250.00     ibuprofen 200 MG tablet  Commonly known as:  ADVIL,MOTRIN  Take 200 mg by mouth every 6 (six) hours as needed.     insulin NPH Human 100 UNIT/ML injection  Commonly known as:  HUMULIN N,NOVOLIN N  Inject 0.22 mLs (22 Units total) into the skin at bedtime. Uses Vial     Insulin Pen Needle 31G X 5 MM Misc  Commonly known as:  B-D UF III MINI PEN NEEDLES  Use 1 pen needle per day     Insulin Syringe-Needle U-100 31G X 5/16" 1 ML Misc  Commonly known as:  BD INSULIN SYRINGE ULTRAFINE  Use to inject insulin daily     ketorolac 0.5 % ophthalmic solution  Commonly known as:  ACULAR     Liraglutide 18 MG/3ML Sopn  Commonly known as:  VICTOZA  Inject 1.2 mg into the skin daily.     meloxicam 15 MG tablet  Commonly known as:  MOBIC  1/2 to 1 tab daily as needed for knee pain     metFORMIN 1000 MG tablet  Commonly known as:  GLUCOPHAGE  Take 1 tablet (1,000 mg total) by mouth 2 (two) times daily with a meal.     metoprolol succinate 25 MG 24 hr tablet  Commonly known as:  TOPROL-XL  Take 1  tablet (25 mg total) by mouth daily. Take with or immediately following a meal.     multivitamin with minerals Tabs tablet  Take 1 tablet by mouth daily.     ofloxacin 0.3 % ophthalmic solution   Commonly known as:  OCUFLOX     olmesartan-hydrochlorothiazide 40-12.5 MG per tablet  Commonly known as:  BENICAR HCT  Take 1 tablet by mouth daily.     pravastatin 40 MG tablet  Commonly known as:  PRAVACHOL  Take 1 tablet (40 mg total) by mouth daily.     prednisoLONE acetate 1 % ophthalmic suspension  Commonly known as:  PRED FORTE     PRESERVISION/LUTEIN Caps  Take by mouth daily.     tamsulosin 0.4 MG Caps capsule  Commonly known as:  FLOMAX  Take 1 capsule (0.4 mg total) by mouth daily.        Allergies:  Allergies  Allergen Reactions  . Morphine And Related Nausea And Vomiting    Past Medical History  Diagnosis Date  . Unspecified essential hypertension   . Pure hypercholesterolemia   . Type II or unspecified type diabetes mellitus without mention of complication, not stated as uncontrolled   . RBBB   . CAD (coronary artery disease)   . Diabetes   . Hyperlipidemia     Past Surgical History  Procedure Laterality Date  . Vein surgery    . Anterior cruciate ligament repair    . Carpal tunnel release      Family History  Problem Relation Age of Onset  . Heart failure Father     Social History:  reports that he has never smoked. He has never used smokeless tobacco. His alcohol and drug histories are not on file.  Review of Systems:  HYPERTENSION:  Blood pressure is  significantly high initially but better on the second measurement He has not checked blood pressure regularly at home and none recently Because of his blood pressure being high with cardiologist about a week ago he was told to increase amlodipine to 10 mg. Still on the old dose of  [place] No recent pedal edema  HYPERLIPIDEMIA: The lipid abnormality consists of elevated LDL and he is taking 40 mg Pravachol, no side effects.  Lab Results  Component Value Date   CHOL 200 03/14/2014   HDL 65.00 03/14/2014   LDLCALC 104* 03/14/2014   TRIG 154.0* 03/14/2014   CHOLHDL 3 03/14/2014     History of daytime fatigability and somnolence if sitting still.  He thinks he has not significantly tired but his wife feels otherwise Has had mild snoring; also has late insomnia  Had been reluctant to do a sleep study even though he was advised to do this by a pulmonologist and his cardiologist. He is not understanding the implications of sleep apnea well  Lab Results  Component Value Date   TSH 0.66 07/31/2013   May get  shortness of breath on exertion,   History of overactive bladder for which he takes Enablex only when he is traveling  Mild anemia: He is taking iron supplements 3 times a week and has had stable hemoglobin levels near normal   Lab Results  Component Value Date   WBC 7.0 06/07/2014   HGB 12.4* 06/07/2014   HCT 37.2* 06/07/2014   MCV 92.0 06/07/2014   PLT 190.0 06/07/2014      Examination:   BP 182/77 mmHg  Pulse 65  Temp(Src) 98.3 F (36.8 C)  Resp 14  Ht 5'  9" (1.753 m)  Wt 232 lb 3.2 oz (105.325 kg)  BMI 34.27 kg/m2  SpO2 96%  Body mass index is 34.27 kg/(m^2).   Repeat blood pressure 144/66.  Pulse 66 regular  No ankle edema    ASSESSMENT/ PLAN:   Diabetes type 2 with obesity   The patient's diabetes control appears to be overall fair considering his age and duration of diabetes.  Has relatively high A1c  of 7.5 from postprandial hyperglycemia since his fasting blood sugars are fairly consistently good in the morning with taking his bedtime NPH. Also no hypoglycemia with NPH insulin or low-dose Amaryl. He does not appear to have any consistent high readings at home, checking at various times of the day but highest reading is 240 after meals or snacks Although he has not gained any more weight discussed that he needs somewhat better post prandial control and this can be achieved by increasing his Victoza to 1.8 if tolerated  Discussed importance of losing weight especially with problem with osteoarthritis of knees, possible sleep apnea    HYPERTENSION: Blood pressure is recently higher but somewhat better now with increasing amlodipine May take more time to be consistent but encouraged him to start checking blood pressure at home Also may improve with treatment of sleep apnea and this was discussed  Hyperlipidemia: We will need follow-up LDL on the next visit  Again recommended sleep study for his daytime fatigue and somnolence as well as possible systemic effects of sleep apnea  but he wants to wait until he has more symptoms  Counseling time over 50% of today's 25 minute visit  Patient Instructions  Victoza 1.8mg  daily  Please check blood sugars at least half the time about 2 hours after any meal and 3 times per week on waking up. Please bring blood sugar monitor to each visit. Recommended blood sugar levels about 2 hours after meal is 140-180 and on waking up 90-130  Check BP weekly     Sirr Kabel 10/18/2014, 10:27 AM

## 2014-10-22 DIAGNOSIS — H2512 Age-related nuclear cataract, left eye: Secondary | ICD-10-CM | POA: Diagnosis not present

## 2014-11-21 DIAGNOSIS — H2511 Age-related nuclear cataract, right eye: Secondary | ICD-10-CM | POA: Diagnosis not present

## 2014-11-21 DIAGNOSIS — H25011 Cortical age-related cataract, right eye: Secondary | ICD-10-CM | POA: Diagnosis not present

## 2014-11-26 ENCOUNTER — Telehealth: Payer: Self-pay | Admitting: Endocrinology

## 2014-11-26 ENCOUNTER — Other Ambulatory Visit: Payer: Self-pay | Admitting: *Deleted

## 2014-11-26 MED ORDER — METFORMIN HCL 1000 MG PO TABS: 1000.0000 mg | ORAL_TABLET | Freq: Two times a day (BID) | ORAL | Status: DC

## 2014-11-26 MED ORDER — GLIMEPIRIDE 2 MG PO TABS: ORAL_TABLET | ORAL | Status: DC

## 2014-11-26 NOTE — Telephone Encounter (Signed)
rx sent

## 2014-11-26 NOTE — Telephone Encounter (Signed)
Patient called and would like for his medications to be called to his pharmacy  Rx: Metformin  Glimpiride   Pharmacy: [facility]   Thank you

## 2014-12-10 DIAGNOSIS — M25561 Pain in right knee: Secondary | ICD-10-CM | POA: Diagnosis not present

## 2014-12-30 ENCOUNTER — Other Ambulatory Visit: Payer: Self-pay | Admitting: *Deleted

## 2014-12-30 ENCOUNTER — Telehealth: Payer: Self-pay | Admitting: Endocrinology

## 2014-12-30 MED ORDER — PRAVASTATIN SODIUM 40 MG PO TABS: 40.0000 mg | ORAL_TABLET | Freq: Every day | ORAL | Status: DC

## 2014-12-30 NOTE — Telephone Encounter (Signed)
Patient need a new prescription of his pravastatin.

## 2014-12-30 NOTE — Telephone Encounter (Signed)
rx sent

## 2015-01-13 ENCOUNTER — Ambulatory Visit (INDEPENDENT_AMBULATORY_CARE_PROVIDER_SITE_OTHER): Payer: Medicare Other | Admitting: Interventional Cardiology

## 2015-01-13 ENCOUNTER — Other Ambulatory Visit (INDEPENDENT_AMBULATORY_CARE_PROVIDER_SITE_OTHER): Payer: Medicare Other

## 2015-01-13 ENCOUNTER — Telehealth: Payer: Self-pay

## 2015-01-13 ENCOUNTER — Encounter: Payer: Self-pay | Admitting: Interventional Cardiology

## 2015-01-13 VITALS — BP 152/70 | HR 59 | Ht 69.0 in | Wt 234.8 lb

## 2015-01-13 DIAGNOSIS — I4510 Unspecified right bundle-branch block: Secondary | ICD-10-CM | POA: Diagnosis not present

## 2015-01-13 DIAGNOSIS — I10 Essential (primary) hypertension: Secondary | ICD-10-CM | POA: Diagnosis not present

## 2015-01-13 DIAGNOSIS — I2510 Atherosclerotic heart disease of native coronary artery without angina pectoris: Secondary | ICD-10-CM | POA: Diagnosis not present

## 2015-01-13 DIAGNOSIS — E785 Hyperlipidemia, unspecified: Secondary | ICD-10-CM | POA: Diagnosis not present

## 2015-01-13 DIAGNOSIS — E1165 Type 2 diabetes mellitus with hyperglycemia: Secondary | ICD-10-CM | POA: Diagnosis not present

## 2015-01-13 DIAGNOSIS — D509 Iron deficiency anemia, unspecified: Secondary | ICD-10-CM

## 2015-01-13 DIAGNOSIS — G4733 Obstructive sleep apnea (adult) (pediatric): Secondary | ICD-10-CM

## 2015-01-13 DIAGNOSIS — IMO0002 Reserved for concepts with insufficient information to code with codable children: Secondary | ICD-10-CM

## 2015-01-13 LAB — CBC
HCT: 36.8 % — ABNORMAL LOW (ref 39.0–52.0)
Hemoglobin: 12.5 g/dL — ABNORMAL LOW (ref 13.0–17.0)
MCHC: 33.8 g/dL (ref 30.0–36.0)
MCV: 89 fl (ref 78.0–100.0)
Platelets: 193 10*3/uL (ref 150.0–400.0)
RBC: 4.14 Mil/uL — ABNORMAL LOW (ref 4.22–5.81)
RDW: 15.1 % (ref 11.5–15.5)
WBC: 7.7 10*3/uL (ref 4.0–10.5)

## 2015-01-13 LAB — COMPREHENSIVE METABOLIC PANEL
ALBUMIN: 4 g/dL (ref 3.5–5.2)
ALT: 16 U/L (ref 0–53)
AST: 18 U/L (ref 0–37)
Alkaline Phosphatase: 54 U/L (ref 39–117)
BUN: 20 mg/dL (ref 6–23)
CALCIUM: 9.9 mg/dL (ref 8.4–10.5)
CHLORIDE: 102 meq/L (ref 96–112)
CO2: 26 meq/L (ref 19–32)
Creatinine, Ser: 0.96 mg/dL (ref 0.40–1.50)
GFR: 80.35 mL/min (ref >60.00)
Glucose, Bld: 116 mg/dL — ABNORMAL HIGH (ref 70–99)
POTASSIUM: 4.4 meq/L (ref 3.5–5.1)
SODIUM: 135 meq/L (ref 135–145)
TOTAL PROTEIN: 7 g/dL (ref 6.0–8.3)
Total Bilirubin: 0.5 mg/dL (ref 0.2–1.2)

## 2015-01-13 LAB — TSH: TSH: 0.81 u[IU]/mL (ref 0.35–4.50)

## 2015-01-13 LAB — LIPID PANEL
CHOL/HDL RATIO: 3
CHOLESTEROL: 184 mg/dL (ref 0–200)
HDL: 58.5 mg/dL (ref >39.00)
LDL Cholesterol: 100 mg/dL — ABNORMAL HIGH (ref 0–99)
NonHDL: 125.5
TRIGLYCERIDES: 129 mg/dL (ref 0.0–149.0)
VLDL: 25.8 mg/dL (ref 0.0–40.0)

## 2015-01-13 LAB — HEMOGLOBIN A1C: Hgb A1c MFr Bld: 7.2 % — ABNORMAL HIGH (ref 4.6–6.5)

## 2015-01-13 NOTE — Patient Instructions (Signed)
Medication Instructions:  Your physician recommends that you continue on your current medications as directed. Please refer to the Current Medication list given to you today.   Labwork: None   Testing/Procedures: Dr.Smith has recommended that you schedule your CPAP titration   Follow-Up: Your physician wants you to follow-up in: 6 month with Dr.Smith You will receive a reminder letter in the mail two months in advance. If you don't receive a letter, please call our office to schedule the follow-up appointment.   Any Other Special Instructions Will Be Listed Below (If Applicable).

## 2015-01-13 NOTE — Progress Notes (Signed)
Cardiology Office Note   Date:  01/13/2015   ID:  Todd Romero, DOB [birth year], MRN [id number]  PCP:  [name], MD  Cardiologist:   Sinclair Grooms, MD   Chief Complaint  Patient presents with  . Coronary Artery Diease  . Diastolic CHF      History of Present Illness: Todd Romero is a 81 y.o. male who presents for Hypertension, severe obstructive sleep apnea, CAD with moderate coronary disease, and right bundle branch block.  The patient and wife feel that beta blocker therapy is causing him to be fatigued and weak. In reviewing his records, the sleep study that we ordered reveals severe obstructive sleep apnea and he needs to be scheduled for a CPAP  titration study. He has excessive daytime sleepiness, lower extremity swelling, and dyspnea on exertion.    Past Medical History  Diagnosis Date  . Unspecified essential hypertension   . Pure hypercholesterolemia   . Type II or unspecified type diabetes mellitus without mention of complication, not stated as uncontrolled   . RBBB   . CAD (coronary artery disease)   . Diabetes   . Hyperlipidemia     Past Surgical History  Procedure Laterality Date  . Vein surgery    . Anterior cruciate ligament repair    . Carpal tunnel release       Current Outpatient Prescriptions  Medication Sig Dispense Refill  . amLODipine (NORVASC) 10 MG tablet TAKE ONE TABLET DAILY IN THE EVENING 90 tablet 3  . aspirin 81 MG tablet Take 81 mg by mouth daily.    . ferrous sulfate 325 (65 FE) MG tablet Take 325 mg by mouth daily with breakfast.    . fish oil-omega-3 fatty acids 1000 MG capsule Take 2 g by mouth daily.    [name] glimepiride (AMARYL) 2 MG tablet 1/2 tablet once a day 45 tablet 1  . glucose blood (ACCU-CHEK AVIVA) test strip Use as instructed to check blood sugars twice a day dx code 250.00 200 each 3  . ibuprofen (ADVIL,MOTRIN) 200 MG tablet Take 200 mg by mouth every 6 (six) hours as needed.    . insulin NPH Human  (HUMULIN N,NOVOLIN N) 100 UNIT/ML injection Inject 0.22 mLs (22 Units total) into the skin at bedtime. Uses Vial 20 mL 3  . Insulin Pen Needle (B-D UF III MINI PEN NEEDLES) 31G X 5 MM MISC Use 1 pen needle per day 30 each 5  . Insulin Syringe-Needle U-100 (BD INSULIN SYRINGE ULTRAFINE) 31G X 5/16" 1 ML MISC Use to inject insulin daily 100 each 5  . ketorolac (ACULAR) 0.5 % ophthalmic solution     . Liraglutide (VICTOZA) 18 MG/3ML SOPN Inject 1.2 mg into the skin daily. 6 pen 3  . meloxicam (MOBIC) 15 MG tablet 1/2 to 1 tab daily as needed for knee pain 15 tablet 1  . metFORMIN (GLUCOPHAGE) 1000 MG tablet Take 1 tablet (1,000 mg total) by mouth 2 (two) times daily with a meal. 180 tablet 1  . metoprolol succinate (TOPROL-XL) 25 MG 24 hr tablet Take 1 tablet (25 mg total) by mouth daily. Take with or immediately following a meal. 90 tablet 3  . Multiple Vitamin (MULTIVITAMIN WITH MINERALS) TABS tablet Take 1 tablet by mouth daily.    . Multiple Vitamins-Minerals (PRESERVISION/LUTEIN) CAPS Take by mouth daily.    [name] ofloxacin (OCUFLOX) 0.3 % ophthalmic solution     . olmesartan-hydrochlorothiazide (BENICAR HCT) 40-12.5 MG per tablet Take 1 tablet by  mouth daily. 90 tablet 1  . pravastatin (PRAVACHOL) 40 MG tablet Take 1 tablet (40 mg total) by mouth daily. 90 tablet 1  . prednisoLONE acetate (PRED FORTE) 1 % ophthalmic suspension     . tamsulosin (FLOMAX) 0.4 MG CAPS capsule Take 1 capsule (0.4 mg total) by mouth daily. 90 capsule 1   No current facility-administered medications for this visit.    Allergies:   Morphine and related    Social History:  The patient  reports that he has never smoked. He has never used smokeless tobacco.   Family History:  The patient's family history includes Cancer in his sister; Emphysema in his father; Healthy in his sister; Heart failure in his father; Hypertension in his mother.    ROS:  Please see the history of present illness.   Otherwise, review of  systems are positive for fatigue, shortness of breath, lower extremity edema.   All other systems are reviewed and negative.    PHYSICAL EXAM: VS:  BP 152/70 mmHg  Pulse 59  Ht 5\' 9"  (1.753 m)  Wt 234 lb 12.8 oz (106.505 kg)  BMI 34.66 kg/m2  SpO2 97% , BMI Body mass index is 34.66 kg/(m^2). GEN: Well nourished, well developed, in no acute distress HEENT: normal Neck: no JVD, carotid bruits, or masses Cardiac: RRR; no murmurs, rubs, or gallops,no edema  Respiratory:  clear to auscultation bilaterally, normal work of breathing GI: soft, nontender, nondistended, + BS MS: no deformity or atrophy Skin: warm and dry, no rash Neuro:  Strength and sensation are intact Psych: euthymic mood, full affect   EKG:  EKG is not ordered toda   Recent Labs: 06/07/2014: Hemoglobin 12.4*; Platelets 190.0 07/25/2014: Pro B Natriuretic peptide (BNP) 22.0 10/14/2014: ALT 12; BUN 19; BUN 19; Creatinine 0.94; Creatinine 0.94; Potassium 4.3; Potassium 4.3; Sodium 135; Sodium 135    Lipid Panel    Component Value Date/Time   CHOL 200 03/14/2014 0826   TRIG 154.0* 03/14/2014 0826   HDL 65.00 03/14/2014 0826   CHOLHDL 3 03/14/2014 0826   VLDL 30.8 03/14/2014 0826   LDLCALC 104* 03/14/2014 0826      Wt Readings from Last 3 Encounters:  01/13/15 234 lb 12.8 oz (106.505 kg)  10/18/14 232 lb 3.2 oz (105.325 kg)  10/14/14 234 lb (106.142 kg)      Other studies Reviewed: Additional studies/ records that were reviewed today include: .    ASSESSMENT AND PLAN:   Coronary artery disease involving native coronary artery of native heart without angina pectoris: No chest pain  Essential hypertension, benign: Borderline control  Severe Obstructive Sleep apnea: Untreated  Hyperlipidemia: Not addressed on today's notes     Current medicines are reviewed at length with the patient today.  The patient has concerns regarding medicines.  The following changes have been made:  Feels that the beta  blockers causing fatigue. I recommended that he undergo C Pap titration and then we will determine if the metoprolol has anything to do with his fatigue.  Labs/ tests ordered today include:  No orders of the defined types were placed in this encounter.     Disposition:   FU with HS in 6 months  Signed, Sinclair Grooms, MD  01/13/2015 11:54 AM    [facility] [address], [place], [place]  [address] Phone: [phone]; Fax: [phone]

## 2015-01-13 NOTE — Telephone Encounter (Signed)
At Dr. Wylie Hail today, patient decided he wants CPAP titration.  Ordered for scheduling.

## 2015-01-14 DIAGNOSIS — H2511 Age-related nuclear cataract, right eye: Secondary | ICD-10-CM | POA: Diagnosis not present

## 2015-01-16 ENCOUNTER — Encounter: Payer: Self-pay | Admitting: Endocrinology

## 2015-01-16 ENCOUNTER — Ambulatory Visit (INDEPENDENT_AMBULATORY_CARE_PROVIDER_SITE_OTHER): Payer: Medicare Other | Admitting: Endocrinology

## 2015-01-16 VITALS — BP 140/78 | HR 60 | Temp 98.3°F | Resp 14 | Ht 69.0 in | Wt 238.0 lb

## 2015-01-16 DIAGNOSIS — I2510 Atherosclerotic heart disease of native coronary artery without angina pectoris: Secondary | ICD-10-CM

## 2015-01-16 DIAGNOSIS — E1165 Type 2 diabetes mellitus with hyperglycemia: Secondary | ICD-10-CM

## 2015-01-16 DIAGNOSIS — G4733 Obstructive sleep apnea (adult) (pediatric): Secondary | ICD-10-CM | POA: Diagnosis not present

## 2015-01-16 DIAGNOSIS — D539 Nutritional anemia, unspecified: Secondary | ICD-10-CM

## 2015-01-16 DIAGNOSIS — I10 Essential (primary) hypertension: Secondary | ICD-10-CM

## 2015-01-16 DIAGNOSIS — E785 Hyperlipidemia, unspecified: Secondary | ICD-10-CM

## 2015-01-16 DIAGNOSIS — IMO0002 Reserved for concepts with insufficient information to code with codable children: Secondary | ICD-10-CM

## 2015-01-16 DIAGNOSIS — R0600 Dyspnea, unspecified: Secondary | ICD-10-CM | POA: Diagnosis not present

## 2015-01-16 NOTE — Progress Notes (Signed)
Patient ID: Akhilesh Sparr Brabec, male   DOB: [birth year], 81 y.o.   MRN: [id number]    Reason for Appointment: Diabetes follow-up   History of Present Illness   Diagnosis: Type 2 DIABETES MELITUS, date of diagnosis:  1997   He has been on various regimens for his diabetes and has been on bedtime insulin with NPH since 2005 He also has benefited from adding Victoza in 2011 with better postprandial control and some weight loss Previously his weight has been as much as 247 pounds  On Victoza  since 11/14 with further improvement in blood sugar control.  However since 2015 his blood sugars have been overall high with A1c over 7% consistently. On his last visit he was told to increase his Victoza to 1.8 mg to improve his level of control especially postprandial and facilitate some weight loss On this visit A1c is slightly better at 7.2  Current blood sugar patterns and problems identified:  His fasting blood sugars are excellent and as low as 81, however has not had any overnight hypoglycemia  Does have mild increase in postprandial readings after supper but not consistent; averaging 175  Blood sugars were higher earlier this month when he was traveling and not exercising  Occasionally may have relatively high readings around supper time  He has difficulty exercising consistently because of getting shortness of breath with aerobic activity  Gaining weight progressively, may be able to cut back on certain foods like pizza or more carbohydrates  No side effects from Victoza 1.8 mg daily No hypoglycemia with NPH at bedtime  Oral hypoglycemic drugs: Amaryl in the a.m. and metformin        Side effects from medications: None Insulin regimen: NPH 22 units at bedtime           Proper timing of medications in relation to meals: Yes.         Monitors blood glucose: 1.9x a day.    Glucometer:  Accu-Chek         Blood Glucose readings from meter download:   PRE-MEAL Breakfast Lunch  Dinner Bedtime Overall  Glucose range: 81-133    95-170     Mean/median:  106      129    POST-MEAL PC Breakfast PC Lunch PC Dinner  Glucose range:    122-248   Mean/median:    175           Physical activity: exercise: Walking, riding the bike a little, weight training, limited by shortness of breath    Wt Readings from Last 3 Encounters:  01/16/15 238 lb (107.956 kg)  01/13/15 234 lb 12.8 oz (106.505 kg)  10/18/14 232 lb 3.2 oz (105.325 kg)   LABS: Lab Results  Component Value Date   HGBA1C 7.2* 01/13/2015   HGBA1C 7.5* 10/14/2014   HGBA1C 7.3* 06/07/2014   Lab Results  Component Value Date   MICROALBUR 4.5* 10/14/2014   LDLCALC 100* 01/13/2015   CREATININE 0.96 01/13/2015     Lab on 01/13/2015  Component Date Value Ref Range Status  . Hgb A1c MFr Bld 01/13/2015 7.2* 4.6 - 6.5 % Final   Glycemic Control Guidelines for People with Diabetes:Non Diabetic:  <6%Goal of Therapy: <7%Additional Action Suggested:  >8%   . Sodium 01/13/2015 135  135 - 145 mEq/L Final  . Potassium 01/13/2015 4.4  3.5 - 5.1 mEq/L Final  . Chloride 01/13/2015 102  96 - 112 mEq/L Final  . CO2 01/13/2015 26  19 -  32 mEq/L Final  . Glucose, Bld 01/13/2015 116* 70 - 99 mg/dL Final  . BUN 01/13/2015 20  6 - 23 mg/dL Final  . Creatinine, Ser 01/13/2015 0.96  0.40 - 1.50 mg/dL Final  . Total Bilirubin 01/13/2015 0.5  0.2 - 1.2 mg/dL Final  . Alkaline Phosphatase 01/13/2015 54  39 - 117 U/L Final  . AST 01/13/2015 18  0 - 37 U/L Final  . ALT 01/13/2015 16  0 - 53 U/L Final  . Total Protein 01/13/2015 7.0  6.0 - 8.3 g/dL Final  . Albumin 01/13/2015 4.0  3.5 - 5.2 g/dL Final  . Calcium 01/13/2015 9.9  8.4 - 10.5 mg/dL Final  . GFR 01/13/2015 80.35  >60.00 mL/min Final  . Cholesterol 01/13/2015 184  0 - 200 mg/dL Final   ATP III Classification       Desirable:  < 200 mg/dL               Borderline High:  200 - 239 mg/dL          High:  > = 240 mg/dL  . Triglycerides 01/13/2015 129.0  0.0 - 149.0 mg/dL  Final   Normal:  <150 mg/dLBorderline High:  150 - 199 mg/dL  . HDL 01/13/2015 58.50  >39.00 mg/dL Final  . VLDL 01/13/2015 25.8  0.0 - 40.0 mg/dL Final  . LDL Cholesterol 01/13/2015 100* 0 - 99 mg/dL Final  . Total CHOL/HDL Ratio 01/13/2015 3   Final                  Men          Women1/2 Average Risk     3.4          3.3Average Risk          5.0          4.42X Average Risk          9.6          7.13X Average Risk          15.0          11.0                      . NonHDL 01/13/2015 125.50   Final   NOTE:  Non-HDL goal should be 30 mg/dL higher than patient's LDL goal (i.e. LDL goal of < 70 mg/dL, would have non-HDL goal of < 100 mg/dL)  . TSH 01/13/2015 0.81  0.35 - 4.50 uIU/mL Final  . WBC 01/13/2015 7.7  4.0 - 10.5 K/uL Final  . RBC 01/13/2015 4.14* 4.22 - 5.81 Mil/uL Final  . Platelets 01/13/2015 193.0  150.0 - 400.0 K/uL Final  . Hemoglobin 01/13/2015 12.5* 13.0 - 17.0 g/dL Final  . HCT 01/13/2015 36.8* 39.0 - 52.0 % Final  . MCV 01/13/2015 89.0  78.0 - 100.0 fl Final  . MCHC 01/13/2015 33.8  30.0 - 36.0 g/dL Final  . RDW 01/13/2015 15.1  11.5 - 15.5 % Final      Medication List       This list is accurate as of: 01/16/15  1:07 PM.  Always use your most recent med list.               amLODipine 10 MG tablet  Commonly known as:  NORVASC  TAKE ONE TABLET DAILY IN THE EVENING     aspirin 81 MG tablet  Take 81 mg by mouth daily.  ferrous sulfate 325 (65 FE) MG tablet  Take 325 mg by mouth daily with breakfast.     fish oil-omega-3 fatty acids 1000 MG capsule  Take 2 g by mouth daily.     glimepiride 2 MG tablet  Commonly known as:  AMARYL  1/2 tablet once a day     glucose blood test strip  Commonly known as:  ACCU-CHEK AVIVA  Use as instructed to check blood sugars twice a day dx code 250.00     ibuprofen 200 MG tablet  Commonly known as:  ADVIL,MOTRIN  Take 200 mg by mouth every 6 (six) hours as needed.     insulin NPH Human 100 UNIT/ML injection  Commonly  known as:  HUMULIN N,NOVOLIN N  Inject 0.22 mLs (22 Units total) into the skin at bedtime. Uses Vial     Insulin Pen Needle 31G X 5 MM Misc  Commonly known as:  B-D UF III MINI PEN NEEDLES  Use 1 pen needle per day     Insulin Syringe-Needle U-100 31G X 5/16" 1 ML Misc  Commonly known as:  BD INSULIN SYRINGE ULTRAFINE  Use to inject insulin daily     ketorolac 0.5 % ophthalmic solution  Commonly known as:  ACULAR     Liraglutide 18 MG/3ML Sopn  Commonly known as:  VICTOZA  Inject 1.2 mg into the skin daily.     meloxicam 15 MG tablet  Commonly known as:  MOBIC  1/2 to 1 tab daily as needed for knee pain     metFORMIN 1000 MG tablet  Commonly known as:  GLUCOPHAGE  Take 1 tablet (1,000 mg total) by mouth 2 (two) times daily with a meal.     metoprolol succinate 25 MG 24 hr tablet  Commonly known as:  TOPROL-XL  Take 1 tablet (25 mg total) by mouth daily. Take with or immediately following a meal.     multivitamin with minerals Tabs tablet  Take 1 tablet by mouth daily.     ofloxacin 0.3 % ophthalmic solution  Commonly known as:  OCUFLOX     olmesartan-hydrochlorothiazide 40-12.5 MG per tablet  Commonly known as:  BENICAR HCT  Take 1 tablet by mouth daily.     pravastatin 40 MG tablet  Commonly known as:  PRAVACHOL  Take 1 tablet (40 mg total) by mouth daily.     prednisoLONE acetate 1 % ophthalmic suspension  Commonly known as:  PRED FORTE     PRESERVISION/LUTEIN Caps  Take by mouth daily.     tamsulosin 0.4 MG Caps capsule  Commonly known as:  FLOMAX  Take 1 capsule (0.4 mg total) by mouth daily.        Allergies:  Allergies  Allergen Reactions  . Morphine And Related Nausea And Vomiting    Past Medical History  Diagnosis Date  . Unspecified essential hypertension   . Pure hypercholesterolemia   . Type II or unspecified type diabetes mellitus without mention of complication, not stated as uncontrolled   . RBBB   . CAD (coronary artery disease)   .  Diabetes   . Hyperlipidemia     Past Surgical History  Procedure Laterality Date  . Vein surgery    . Anterior cruciate ligament repair    . Carpal tunnel release      Family History  Problem Relation Age of Onset  . Heart failure Father   . Emphysema Father   . Hypertension Mother   . Cancer Sister   .  Healthy Sister     Social History:  reports that he has never smoked. He has never used smokeless tobacco. His alcohol and drug histories are not on file.  Review of Systems:  HYPERTENSION:  Blood pressure is appearing better today He has not checked blood pressure regularly at home and none recently Still on Benicar HCT unchanged Has slight recent pedal edema after increasing the amlodipine dose to 10 mg  HYPERLIPIDEMIA: The lipid abnormality consists of elevated LDL and he is taking 40 mg Pravachol, no side effects.  Lab Results  Component Value Date   CHOL 184 01/13/2015   HDL 58.50 01/13/2015   LDLCALC 100* 01/13/2015   TRIG 129.0 01/13/2015   CHOLHDL 3 01/13/2015    History of daytime fatigability and somnolence if sitting still.   He finally did the sleep study and has severe OSA, awaiting C titration  Has had mild snoring; also has late insomnia  He is finally understanding the medical implications of sleep apnea    Lab Results  Component Value Date   TSH 0.81 01/13/2015   Still getting shortness of breath on exertion, this occurs with walking or doing at work.  No cough or wheezing He says his father had emphysema  History of overactive bladder for which he takes Enablex only when he is traveling  Mild anemia: He is taking iron supplements 3 times a week and has had stable hemoglobin levels near normal  No history of B-12 deficiency  Lab Results  Component Value Date   WBC 7.7 01/13/2015   HGB 12.5* 01/13/2015   HCT 36.8* 01/13/2015   MCV 89.0 01/13/2015   PLT 193.0 01/13/2015      Examination:   BP 140/78 mmHg  Pulse 60  Temp(Src) 98.3  F (36.8 C)  Resp 14  Ht 5\' 9"  (1.753 m)  Wt 238 lb (107.956 kg)  BMI 35.13 kg/m2  SpO2 97%  Body mass index is 35.13 kg/(m^2).   Trace pedal edema present     ASSESSMENT/ PLAN:   Diabetes type 2 with obesity   The patient's diabetes control appears to be overall fair considering his age and duration of diabetes.  Has relatively better A1c of 7.2 and may be improved with increasing his Victoza on the last visit However he is still having some postprandial hyperglycemia after evening meal based on what he is eating Fasting blood sugars are excellent without overnight hypoglycemia from bedtime NPH His current problems with control and blood sugar patterns are discussed in history of present illness in detail He does have some limitation of his exercise ability and is trying to do what he can Has gained weight again and may be able to do better with diet also since he still has some high postprandial readings at times  For now we will continue his treatment regimen unchanged He will call if he has consistently high readings after meals Also consider reducing NPH at bedtime if having low normal readings overnight  DYSPNEA on exertion: This is not explained easily by his cardiac history and will check pulmonary function today, has family history of emphysema  HYPERTENSION: Blood pressure is  better now with increasing amlodipine He does have a little ankle edema from this May take more time to be consistent but encouraged him to start checking blood pressure at home Also may improve with treatment of sleep apnea and this was discussed No change in medications needed this time He'll continue to adjust the dose of Toprol  with with cardiologist  Hyperlipidemia: We will need to continue his pravastatin, LDL now 100  SLEEP apnea: He is going to get CPAP titration done and encouraged him to follow-up with treatment  Mild anemia: Check B-12 on the next visit since he is on long-term  metformin   Counseling time on subjects discussed above is over 50% of today's 25 minute visit  Aikam Hellickson 01/16/2015, 1:07 PM   Addendum: Spirometry shows FEV1 and FVC around 65% of predicted, will need pulmonary evaluation and management of dyspnea along with his sleep apnea.  Discussed with patient

## 2015-01-23 ENCOUNTER — Other Ambulatory Visit: Payer: Self-pay | Admitting: *Deleted

## 2015-01-23 MED ORDER — INSULIN NPH (HUMAN) (ISOPHANE) 100 UNIT/ML ~~LOC~~ SUSP: 22.0000 [IU] | Freq: Every day | SUBCUTANEOUS | Status: DC

## 2015-02-11 ENCOUNTER — Ambulatory Visit (INDEPENDENT_AMBULATORY_CARE_PROVIDER_SITE_OTHER): Payer: Medicare Other | Admitting: Emergency Medicine

## 2015-02-11 ENCOUNTER — Encounter: Payer: Self-pay | Admitting: Emergency Medicine

## 2015-02-11 ENCOUNTER — Ambulatory Visit (INDEPENDENT_AMBULATORY_CARE_PROVIDER_SITE_OTHER)
Admission: RE | Admit: 2015-02-11 | Discharge: 2015-02-11 | Disposition: A | Discharge status: Home / Self Care | Payer: Medicare Other | Source: Ambulatory Visit | Attending: Emergency Medicine | Admitting: Emergency Medicine

## 2015-02-11 VITALS — BP 160/80 | HR 65 | Ht 69.0 in | Wt 230.0 lb

## 2015-02-11 DIAGNOSIS — R0602 Shortness of breath: Secondary | ICD-10-CM | POA: Diagnosis not present

## 2015-02-11 DIAGNOSIS — R0609 Other forms of dyspnea: Secondary | ICD-10-CM

## 2015-02-11 DIAGNOSIS — G4733 Obstructive sleep apnea (adult) (pediatric): Secondary | ICD-10-CM | POA: Insufficient documentation

## 2015-02-11 DIAGNOSIS — R0600 Dyspnea, unspecified: Secondary | ICD-10-CM

## 2015-02-11 DIAGNOSIS — I2510 Atherosclerotic heart disease of native coronary artery without angina pectoris: Secondary | ICD-10-CM | POA: Diagnosis not present

## 2015-02-11 HISTORY — DX: Dyspnea, unspecified: R06.00

## 2015-02-11 HISTORY — DX: Other forms of dyspnea: R06.09

## 2015-02-11 NOTE — Patient Instructions (Signed)
Please get your repeat sleep study and CPAP titration as planned We will schedule full pulmonary function testing We will perform a chest x-ray today Follow with Dr Lamonte Sakai next available after your testing to review the results.

## 2015-02-11 NOTE — Progress Notes (Signed)
Subjective:    Patient ID: Todd Romero, male    DOB: June 04, 1934, 81 y.o.   MRN: [id number]  HPI 81 yo man, never smoker, hx of CAD, DM, HTN. Pt is referred for abnormal spirometry by Dr Dwyane Dee. History is taken from the patient and his wife. He has been dealing with fatigue, dyspnea w exertion. Dr Linard Millers sent him for PSG that was positive for severe OSA, he is due to return for a CPAP titration. He describes SOB that happens with pushing a lawnmower, walking on a track. Has to stop to rest. No CP, no cough, no wheeze. He had gained wt to 250, now back down to 230.  Dr Tamala Julian decreased his b-blocker but dyspnea continued, fatigue continues.     Review of Systems  Constitutional: Negative for fever and unexpected weight change.  HENT: Negative for congestion, dental problem, ear pain, nosebleeds, postnasal drip, rhinorrhea, sinus pressure, sneezing, sore throat and trouble swallowing.   Eyes: Negative for redness and itching.  Respiratory: Positive for shortness of breath. Negative for cough, chest tightness and wheezing.   Cardiovascular: Negative for palpitations and leg swelling.  Gastrointestinal: Negative for nausea and vomiting.  Genitourinary: Negative for dysuria.  Musculoskeletal: Negative for joint swelling.  Skin: Negative for rash.  Neurological: Negative for headaches.  Hematological: Does not bruise/bleed easily.  Psychiatric/Behavioral: Negative for dysphoric mood. The patient is not nervous/anxious.     Past Medical History  Diagnosis Date  . Unspecified essential hypertension   . Pure hypercholesterolemia   . Type II or unspecified type diabetes mellitus without mention of complication, not stated as uncontrolled   . RBBB   . CAD (coronary artery disease)   . Diabetes   . Hyperlipidemia      Family History  Problem Relation Age of Onset  . Heart failure Father   . Emphysema Father   . Hypertension Mother   . Cancer Sister   . Healthy Sister       History   Social History  . Marital Status: Married    Spouse Name: N/A  . Number of Children: N/A  . Years of Education: N/A   Occupational History  . Not on file.   Social History Main Topics  . Smoking status: Never Smoker   . Smokeless tobacco: Never Used  . Alcohol Use: Not on file  . Drug Use: Not on file  . Sexual Activity: Not on file   Other Topics Concern  . Not on file   Social History Narrative  He has worked Architect, Lobbyist.  Has lived in [place], [place].   Allergies  Allergen Reactions  . Morphine And Related Nausea And Vomiting     Outpatient Prescriptions Prior to Visit  Medication Sig Dispense Refill  . amLODipine (NORVASC) 10 MG tablet TAKE ONE TABLET DAILY IN THE EVENING 90 tablet 3  . aspirin 81 MG tablet Take 81 mg by mouth daily.    . ferrous sulfate 325 (65 FE) MG tablet Take 325 mg by mouth daily with breakfast.    . fish oil-omega-3 fatty acids 1000 MG capsule Take 2 g by mouth daily.    [name] glimepiride (AMARYL) 2 MG tablet 1/2 tablet once a day 45 tablet 1  . glucose blood (ACCU-CHEK AVIVA) test strip Use as instructed to check blood sugars twice a day dx code 250.00 200 each 3  . ibuprofen (ADVIL,MOTRIN) 200 MG tablet Take 200 mg by mouth every 6 (  six) hours as needed.    . insulin NPH Human (HUMULIN N,NOVOLIN N) 100 UNIT/ML injection Inject 0.22 mLs (22 Units total) into the skin at bedtime. Uses Vial 20 mL 3  . Insulin Pen Needle (B-D UF III MINI PEN NEEDLES) 31G X 5 MM MISC Use 1 pen needle per day 30 each 5  . Insulin Syringe-Needle U-100 (BD INSULIN SYRINGE ULTRAFINE) 31G X 5/16" 1 ML MISC Use to inject insulin daily 100 each 5  . Liraglutide (VICTOZA) 18 MG/3ML SOPN Inject 1.2 mg into the skin daily. 6 pen 3  . metFORMIN (GLUCOPHAGE) 1000 MG tablet Take 1 tablet (1,000 mg total) by mouth 2 (two) times daily with a meal. 180 tablet 1  . metoprolol succinate (TOPROL-XL) 25 MG 24 hr tablet Take 1 tablet (25 mg total)  by mouth daily. Take with or immediately following a meal. 90 tablet 3  . Multiple Vitamin (MULTIVITAMIN WITH MINERALS) TABS tablet Take 1 tablet by mouth daily.    . Multiple Vitamins-Minerals (PRESERVISION/LUTEIN) CAPS Take by mouth daily.    [name] olmesartan-hydrochlorothiazide (BENICAR HCT) 40-12.5 MG per tablet Take 1 tablet by mouth daily. 90 tablet 1  . pravastatin (PRAVACHOL) 40 MG tablet Take 1 tablet (40 mg total) by mouth daily. 90 tablet 1  . tamsulosin (FLOMAX) 0.4 MG CAPS capsule Take 1 capsule (0.4 mg total) by mouth daily. 90 capsule 1  . ketorolac (ACULAR) 0.5 % ophthalmic solution     . meloxicam (MOBIC) 15 MG tablet 1/2 to 1 tab daily as needed for knee pain (Patient not taking: Reported on 01/16/2015) 15 tablet 1  . ofloxacin (OCUFLOX) 0.3 % ophthalmic solution     . prednisoLONE acetate (PRED FORTE) 1 % ophthalmic suspension      No facility-administered medications prior to visit.       Objective:   Physical Exam Filed Vitals:   02/11/15 1413 02/11/15 1414  BP:  160/80  Pulse:  65  Height: 5\' 9"  (1.753 m)   Weight: 230 lb (104.327 kg)   SpO2:  94%   Gen: Pleasant, overwt, in no distress,  normal affect  ENT: No lesions,  mouth clear,  oropharynx clear, no postnasal drip  Neck: No JVD, no TMG, no carotid bruits  Lungs: No use of accessory muscles, clear without rales or rhonchi  Cardiovascular: RRR, heart sounds normal, no murmur or gallops, no peripheral edema  Musculoskeletal: No deformities, no cyanosis or clubbing  Neuro: alert, non focal  Skin: Warm, no lesions or rashes     Assessment & Plan:  Obstructive sleep apnea Severe OSA identified on his polysomnogram. He is scheduled to go back for CPAP titration study in July. Strongly encouraged him to do this as it may be that sleep apnea explains much of his fatigue.    Dyspnea on exertion Etiology unclear. There may be some component of deconditioning. His spirometry performed at Dr Ronnie Derby office  shows evidence for possible mixed obstruction and restriction. I would like to perform formal PFT to confirm this. We'll also perform a chest x-ray today. I will follow-up with him after his testing to review the results

## 2015-02-11 NOTE — Addendum Note (Signed)
Addended by: Desmond Dike C on: 02/11/2015 02:46 PM   Modules accepted: Orders

## 2015-02-11 NOTE — Assessment & Plan Note (Signed)
Severe OSA identified on his polysomnogram. He is scheduled to go back for CPAP titration study in July. Strongly encouraged him to do this as it may be that sleep apnea explains much of his fatigue.

## 2015-02-11 NOTE — Assessment & Plan Note (Signed)
Etiology unclear. There may be some component of deconditioning. His spirometry performed at Dr Ronnie Derby office shows evidence for possible mixed obstruction and restriction. I would like to perform formal PFT to confirm this. We'll also perform a chest x-ray today. I will follow-up with him after his testing to review the results

## 2015-02-21 ENCOUNTER — Other Ambulatory Visit: Payer: Self-pay | Admitting: *Deleted

## 2015-02-21 MED ORDER — LIRAGLUTIDE 18 MG/3ML ~~LOC~~ SOPN: 1.8000 mg | PEN_INJECTOR | Freq: Every day | SUBCUTANEOUS | Status: DC

## 2015-02-27 DIAGNOSIS — H3531 Nonexudative age-related macular degeneration: Secondary | ICD-10-CM | POA: Diagnosis not present

## 2015-02-27 DIAGNOSIS — H33002 Unspecified retinal detachment with retinal break, left eye: Secondary | ICD-10-CM | POA: Diagnosis not present

## 2015-02-27 DIAGNOSIS — H353132 Nonexudative age-related macular degeneration, bilateral, intermediate dry stage: Secondary | ICD-10-CM | POA: Insufficient documentation

## 2015-02-27 DIAGNOSIS — E119 Type 2 diabetes mellitus without complications: Secondary | ICD-10-CM | POA: Diagnosis not present

## 2015-02-27 DIAGNOSIS — H43812 Vitreous degeneration, left eye: Secondary | ICD-10-CM | POA: Diagnosis not present

## 2015-02-27 HISTORY — DX: Nonexudative age-related macular degeneration, bilateral, intermediate dry stage: H35.3132

## 2015-02-27 LAB — HM DIABETES EYE EXAM

## 2015-03-04 DIAGNOSIS — Z7982 Long term (current) use of aspirin: Secondary | ICD-10-CM | POA: Diagnosis not present

## 2015-03-04 DIAGNOSIS — M5432 Sciatica, left side: Secondary | ICD-10-CM | POA: Diagnosis not present

## 2015-03-04 DIAGNOSIS — E119 Type 2 diabetes mellitus without complications: Secondary | ICD-10-CM | POA: Diagnosis not present

## 2015-03-04 DIAGNOSIS — Z885 Allergy status to narcotic agent status: Secondary | ICD-10-CM | POA: Diagnosis not present

## 2015-03-04 DIAGNOSIS — R296 Repeated falls: Secondary | ICD-10-CM | POA: Diagnosis not present

## 2015-03-04 DIAGNOSIS — Z794 Long term (current) use of insulin: Secondary | ICD-10-CM | POA: Diagnosis not present

## 2015-03-04 DIAGNOSIS — I10 Essential (primary) hypertension: Secondary | ICD-10-CM | POA: Diagnosis not present

## 2015-03-04 DIAGNOSIS — Z79899 Other long term (current) drug therapy: Secondary | ICD-10-CM | POA: Diagnosis not present

## 2015-03-04 DIAGNOSIS — E785 Hyperlipidemia, unspecified: Secondary | ICD-10-CM | POA: Diagnosis not present

## 2015-03-20 ENCOUNTER — Ambulatory Visit (HOSPITAL_BASED_OUTPATIENT_CLINIC_OR_DEPARTMENT_OTHER): Payer: Medicare Other | Attending: Cardiology

## 2015-03-20 VITALS — Ht 69.0 in | Wt 226.0 lb

## 2015-03-20 DIAGNOSIS — G4730 Sleep apnea, unspecified: Secondary | ICD-10-CM | POA: Diagnosis not present

## 2015-03-20 DIAGNOSIS — G4733 Obstructive sleep apnea (adult) (pediatric): Secondary | ICD-10-CM | POA: Insufficient documentation

## 2015-03-24 ENCOUNTER — Encounter: Payer: Self-pay | Admitting: *Deleted

## 2015-04-01 ENCOUNTER — Other Ambulatory Visit: Payer: Self-pay | Admitting: *Deleted

## 2015-04-01 ENCOUNTER — Telehealth: Payer: Self-pay

## 2015-04-01 MED ORDER — TAMSULOSIN HCL 0.4 MG PO CAPS: 0.4000 mg | ORAL_CAPSULE | Freq: Every day | ORAL | Status: DC

## 2015-04-01 MED ORDER — OLMESARTAN MEDOXOMIL-HCTZ 40-12.5 MG PO TABS: 1.0000 | ORAL_TABLET | Freq: Every day | ORAL | Status: DC

## 2015-04-01 NOTE — Telephone Encounter (Signed)
rx sent

## 2015-04-01 NOTE — Telephone Encounter (Signed)
Pt called requesting a refill on Benicar and Tamsulosin. Pt would [facility].

## 2015-04-03 ENCOUNTER — Ambulatory Visit (INDEPENDENT_AMBULATORY_CARE_PROVIDER_SITE_OTHER): Payer: Medicare Other | Admitting: Emergency Medicine

## 2015-04-03 ENCOUNTER — Encounter: Payer: Self-pay | Admitting: Emergency Medicine

## 2015-04-03 VITALS — BP 128/82 | HR 61 | Ht 68.0 in | Wt 232.0 lb

## 2015-04-03 DIAGNOSIS — R0609 Other forms of dyspnea: Secondary | ICD-10-CM

## 2015-04-03 DIAGNOSIS — G4733 Obstructive sleep apnea (adult) (pediatric): Secondary | ICD-10-CM | POA: Diagnosis not present

## 2015-04-03 DIAGNOSIS — I2510 Atherosclerotic heart disease of native coronary artery without angina pectoris: Secondary | ICD-10-CM

## 2015-04-03 LAB — PULMONARY FUNCTION TEST
DL/VA % pred: 90 %
DL/VA: 4.05 ml/min/mmHg/L
DLCO unc % pred: 72 %
DLCO unc: 21.34 ml/min/mmHg
FEF 25-75 Post: 1.67 L/sec
FEF 25-75 Pre: 1.25 L/sec
FEF2575-%Change-Post: 34 %
FEF2575-%Pred-Post: 90 %
FEF2575-%Pred-Pre: 67 %
FEV1-%Change-Post: 6 %
FEV1-%Pred-Post: 85 %
FEV1-%Pred-Pre: 80 %
FEV1-Post: 2.28 L
FEV1-Pre: 2.14 L
FEV1FVC-%Change-Post: 4 %
FEV1FVC-%Pred-Pre: 96 %
FEV6-%Change-Post: 4 %
FEV6-%Pred-Post: 89 %
FEV6-%Pred-Pre: 86 %
FEV6-Post: 3.11 L
FEV6-Pre: 2.99 L
FEV6FVC-%Change-Post: 1 %
FEV6FVC-%Pred-Post: 106 %
FEV6FVC-%Pred-Pre: 105 %
FVC-%Change-Post: 2 %
FVC-%Pred-Post: 83 %
FVC-%Pred-Pre: 81 %
FVC-Post: 3.14 L
FVC-Pre: 3.06 L
Post FEV1/FVC ratio: 73 %
Post FEV6/FVC ratio: 99 %
Pre FEV1/FVC ratio: 70 %
Pre FEV6/FVC Ratio: 98 %
RV % pred: 104 %
RV: 2.64 L
TLC % pred: 85 %
TLC: 5.66 L

## 2015-04-03 NOTE — Progress Notes (Signed)
PFT done today. 

## 2015-04-03 NOTE — Progress Notes (Signed)
Subjective:    Patient ID: Todd Romero, male    DOB: [birth year], 81 y.o.   MRN: [id number]  HPI 81 yo man, never smoker, hx of CAD, DM, HTN. Pt is referred for abnormal spirometry by Dr Dwyane Dee. History is taken from the patient and his wife. He has been dealing with fatigue, dyspnea w exertion. Dr Linard Millers sent him for PSG that was positive for severe OSA, he is due to return for a CPAP titration. He describes SOB that happens with pushing a lawnmower, walking on a track. Has to stop to rest. No CP, no cough, no wheeze. He had gained wt to 250, now back down to 230.  Dr Tamala Julian decreased his b-blocker but dyspnea continued, fatigue continues.   ROV 04/03/15 -- follow-up visit for exertional dyspnea. As detailed above he's been diagnosed with severe obstructive sleep apnea and is to underwent CPAP titration on 03/21/15. He also had mixed disease on spirometry and has completed full pulmonary function testing today which I personally reviewed. This shows evidence for mild obstruction based on the curve of his flow volume loop. There is no bronchodilator response. Lung volumes and diffusion capacity are normal. He does feel a bit better, feels that his exercise tolerance is better.    Review of Systems  Constitutional: Negative for fever and unexpected weight change.  HENT: Negative for congestion, dental problem, ear pain, nosebleeds, postnasal drip, rhinorrhea, sinus pressure, sneezing, sore throat and trouble swallowing.   Eyes: Negative for redness and itching.  Respiratory: Positive for shortness of breath. Negative for cough, chest tightness and wheezing.   Cardiovascular: Negative for palpitations and leg swelling.  Gastrointestinal: Negative for nausea and vomiting.  Genitourinary: Negative for dysuria.  Musculoskeletal: Negative for joint swelling.  Skin: Negative for rash.  Neurological: Negative for headaches.  Hematological: Does not bruise/bleed easily.  Psychiatric/Behavioral:  Negative for dysphoric mood. The patient is not nervous/anxious.     Past Medical History  Diagnosis Date  . Unspecified essential hypertension   . Pure hypercholesterolemia   . Type II or unspecified type diabetes mellitus without mention of complication, not stated as uncontrolled   . RBBB   . CAD (coronary artery disease)   . Diabetes   . Hyperlipidemia      Family History  Problem Relation Age of Onset  . Heart failure Father   . Emphysema Father   . Hypertension Mother   . Cancer Sister   . Healthy Sister      History   Social History  . Marital Status: Married    Spouse Name: N/A  . Number of Children: N/A  . Years of Education: N/A   Occupational History  . Not on file.   Social History Main Topics  . Smoking status: Never Smoker   . Smokeless tobacco: Never Used  . Alcohol Use: Not on file  . Drug Use: Not on file  . Sexual Activity: Not on file   Other Topics Concern  . Not on file   Social History Narrative  He has worked Architect, Lobbyist.  Has lived in [place], [place].   Allergies  Allergen Reactions  . Morphine And Related Nausea And Vomiting     Outpatient Prescriptions Prior to Visit  Medication Sig Dispense Refill  . amLODipine (NORVASC) 10 MG tablet TAKE ONE TABLET DAILY IN THE EVENING 90 tablet 3  . aspirin 81 MG tablet Take 81 mg by mouth daily.    [name]  ferrous sulfate 325 (65 FE) MG tablet Take 325 mg by mouth daily with breakfast.    . fish oil-omega-3 fatty acids 1000 MG capsule Take 2 g by mouth daily.    [name] glimepiride (AMARYL) 2 MG tablet 1/2 tablet once a day 45 tablet 1  . glucose blood (ACCU-CHEK AVIVA) test strip Use as instructed to check blood sugars twice a day dx code 250.00 200 each 3  . ibuprofen (ADVIL,MOTRIN) 200 MG tablet Take 200 mg by mouth every 6 (six) hours as needed.    . insulin NPH Human (HUMULIN N,NOVOLIN N) 100 UNIT/ML injection Inject 0.22 mLs (22 Units total) into the skin at bedtime.  Uses Vial 20 mL 3  . Insulin Pen Needle (B-D UF III MINI PEN NEEDLES) 31G X 5 MM MISC Use 1 pen needle per day 30 each 5  . Insulin Syringe-Needle U-100 (BD INSULIN SYRINGE ULTRAFINE) 31G X 5/16" 1 ML MISC Use to inject insulin daily 100 each 5  . Liraglutide (VICTOZA) 18 MG/3ML SOPN Inject 0.3 mLs (1.8 mg total) into the skin daily. 9 pen 1  . metFORMIN (GLUCOPHAGE) 1000 MG tablet Take 1 tablet (1,000 mg total) by mouth 2 (two) times daily with a meal. 180 tablet 1  . metoprolol succinate (TOPROL-XL) 25 MG 24 hr tablet Take 1 tablet (25 mg total) by mouth daily. Take with or immediately following a meal. 90 tablet 3  . Multiple Vitamin (MULTIVITAMIN WITH MINERALS) TABS tablet Take 1 tablet by mouth daily.    . Multiple Vitamins-Minerals (PRESERVISION/LUTEIN) CAPS Take by mouth daily.    [name] olmesartan-hydrochlorothiazide (BENICAR HCT) 40-12.5 MG per tablet Take 1 tablet by mouth daily. 90 tablet 1  . pravastatin (PRAVACHOL) 40 MG tablet Take 1 tablet (40 mg total) by mouth daily. 90 tablet 1  . tamsulosin (FLOMAX) 0.4 MG CAPS capsule Take 1 capsule (0.4 mg total) by mouth daily. 90 capsule 1   No facility-administered medications prior to visit.       Objective:   Physical Exam Filed Vitals:   04/03/15 1325  BP: 128/82  Pulse: 61  Height: 5\' 8"  (1.727 m)  Weight: 232 lb (105.235 kg)  SpO2: 95%   Gen: Pleasant, overwt, in no distress,  normal affect  ENT: No lesions,  mouth clear,  oropharynx clear, no postnasal drip  Neck: No JVD, no TMG, no carotid bruits  Lungs: No use of accessory muscles, clear without rales or rhonchi  Cardiovascular: RRR, heart sounds normal, no murmur or gallops, no peripheral edema  Musculoskeletal: No deformities, no cyanosis or clubbing  Neuro: alert, non focal  Skin: Warm, no lesions or rashes     Assessment & Plan:  Dyspnea on exertion Multifactorial dyspnea on exertion and fatigue. I suspect that this will improve once his obstructive sleep  apnea is treated. Hopefully he'll also will come off his beta blocker. His pulmonary function testing from today shows evidence for some mild obstruction based on the curve of his flow volume loop. At this time I like to defer bronchodilators weight to see if he improves with the plans outlined above. If he continues to have dyspnea even after his sleep apnea is addressed then we could do a trial of short acting beta agonist  Obstructive sleep apnea He had a CPAP titration, is waiting for the results to start therapy

## 2015-04-03 NOTE — Patient Instructions (Addendum)
Start CPAP every night after reviewing with Dr Radford Pax We will not start an inhaled medication at this time.  If you continue to have shortness of breath after starting CPAP, after adjusting your cardiac medications, then we can revisit a trial of an inhaled medication.  Follow with Dr Lamonte Sakai in 6 months or sooner if you have any problems

## 2015-04-03 NOTE — Assessment & Plan Note (Signed)
He had a CPAP titration, is waiting for the results to start therapy

## 2015-04-03 NOTE — Assessment & Plan Note (Signed)
Multifactorial dyspnea on exertion and fatigue. I suspect that this will improve once his obstructive sleep apnea is treated. Hopefully he'll also will come off his beta blocker. His pulmonary function testing from today shows evidence for some mild obstruction based on the curve of his flow volume loop. At this time I like to defer bronchodilators weight to see if he improves with the plans outlined above. If he continues to have dyspnea even after his sleep apnea is addressed then we could do a trial of short acting beta agonist

## 2015-04-10 ENCOUNTER — Encounter: Payer: Self-pay | Admitting: Endocrinology

## 2015-04-12 ENCOUNTER — Telehealth: Payer: Self-pay | Admitting: Cardiology

## 2015-04-12 NOTE — Sleep Study (Signed)
SLEEP STUDY     Patient Name: Todd Romero, Todd Romero Date: 03/20/2015 MRN: [id number] Gender: Male D.O.B: 01-18-1934 Age (years): 41 Referring Provider: Fransico Him MD, ABSM Interpreting Physician: Fransico Him MD, ABSM RPSGT: Baxter Flattery   Height (inches): 69 Weight (lbs): 226 BMI: 33 Neck Size: 19.00   CLINICAL INFORMATION The patient is referred for a CPAP titration to treat sleep apnea.  Date of NPSG, Split Night or HST:10/14/2014  SLEEP STUDY TECHNIQUE As per the AASM Manual for the Scoring of Sleep and Associated Events v2.3 (April 2016) with a hypopnea requiring 4% desaturations.  The channels recorded and monitored were frontal, central and occipital EEG, electrooculogram (EOG), submentalis EMG (chin), nasal and oral airflow, thoracic and abdominal wall motion, anterior tibialis EMG, snore microphone, electrocardiogram, and pulse oximetry. Continuous positive airway pressure (CPAP) was initiated at the beginning of the study and titrated to treat sleep-disordered breathing.  MEDICATIONS Medications taken by the patient : Amlodipine, ASA, Ferous Sulfate, Fish Oil, Amaryl, Advil, Victoza, Metformin, Metoprolol, Benicar HCT, Flomax Medications administered by patient during sleep study : No sleep medicine administered.  TECHNICIAN COMMENTS Comments added by technician: Patient had more than two awakenings to use the bathroom  Comments added by scorer: N/A  RESPIRATORY PARAMETERS Optimal PAP Pressure (cm): 9   AHI at Optimal Pressure (/hr):0.0 Overall Minimal O2 (%):90.00    Supine % at Optimal Pressure (%):0 Minimal O2 at Optimal Pressure (%):91.0      SLEEP ARCHITECTURE The study was initiated at 11:11:48 PM and ended at 5:14:14 AM.  Sleep onset time was 5.4 minutes and the sleep efficiency was reduced at 63.8%. The total sleep time was 231.1 minutes.  The patient spent 7.79% of the night in stage N1 sleep, 82.47% in stage N2 sleep, 0.00% in stage N3  and 9.74% in REM.S  tage REM latency was prolonged at 197.5 minutes  Wake after sleep onset was 126.0. Alpha intrusion was absent. Supine sleep was 47.39%.  CARDIAC DATA The 2 lead EKG demonstrated sinus rhythm.   LEG MOVEMENT DATA The total Periodic Limb Movements of Sleep (PLMS) were 479. The PLMS index was 124.38. A PLMS index of <15 is considered normal in adults.  IMPRESSIONS The optimal PAP pressure was 9cm of water. Central sleep apnea was not noted during this titration (CAI = 1.0/h). Mild oxygen desaturations were observed during this titration (min O2 = 90.00%). No snoring was audible during this study. No cardiac abnormalities were observed during this study. Severe periodic limb movements were observed during this study. Arousals associated with PLMs were rare.  DIAGNOSIS Obstructive Sleep Apnea (327.23 [G47.33 ICD-10])  RECOMMENDATIONS CPAP therapy on 9 cm H2O with a Medium size [employer] Full Face Mask Simplus mask and heated humidification. Avoid alcohol, sedatives and other CNS depressants that may worsen sleep apnea and disrupt normal sleep architecture. Sleep hygiene should be reviewed to assess factors that may improve sleep quality. Weight management and regular exercise should be initiated or continued. Return to Sleep provider for re-evaluation after 10 weeks of therapy  Signed: Fransico Him, MD ABSM Diplomat, American Board of Sleep Medicine 04/12/2015

## 2015-04-12 NOTE — Addendum Note (Signed)
Addended by: Fransico Him R on: 04/12/2015 09:27 PM   Modules accepted: Orders, Level of Service

## 2015-04-12 NOTE — Telephone Encounter (Signed)
Please let patient know that they have significant sleep apnea and had successful CPAP titration and will be set up with CPAP unit.  Please let DME know that order is in EPIC.  Please set patient up for OV in 10 weeks 

## 2015-04-14 NOTE — Telephone Encounter (Signed)
Patient is aware of results. [facility] notified.  Once he receives his machine we will schedule a 10 week follow-up visit

## 2015-04-15 ENCOUNTER — Other Ambulatory Visit: Payer: Medicare Other

## 2015-04-18 ENCOUNTER — Ambulatory Visit: Payer: Medicare Other | Admitting: Endocrinology

## 2015-04-22 DIAGNOSIS — M5416 Radiculopathy, lumbar region: Secondary | ICD-10-CM | POA: Diagnosis not present

## 2015-04-25 DIAGNOSIS — M545 Low back pain: Secondary | ICD-10-CM | POA: Diagnosis not present

## 2015-04-29 DIAGNOSIS — M5416 Radiculopathy, lumbar region: Secondary | ICD-10-CM | POA: Diagnosis not present

## 2015-05-06 ENCOUNTER — Other Ambulatory Visit (INDEPENDENT_AMBULATORY_CARE_PROVIDER_SITE_OTHER): Payer: Medicare Other

## 2015-05-06 DIAGNOSIS — E1165 Type 2 diabetes mellitus with hyperglycemia: Secondary | ICD-10-CM

## 2015-05-06 DIAGNOSIS — D539 Nutritional anemia, unspecified: Secondary | ICD-10-CM | POA: Diagnosis not present

## 2015-05-06 DIAGNOSIS — IMO0002 Reserved for concepts with insufficient information to code with codable children: Secondary | ICD-10-CM

## 2015-05-06 LAB — COMPREHENSIVE METABOLIC PANEL
ALK PHOS: 52 U/L (ref 39–117)
ALT: 19 U/L (ref 0–53)
AST: 22 U/L (ref 0–37)
Albumin: 4 g/dL (ref 3.5–5.2)
BUN: 20 mg/dL (ref 6–23)
CO2: 25 meq/L (ref 19–32)
Calcium: 9.9 mg/dL (ref 8.4–10.5)
Chloride: 103 mEq/L (ref 96–112)
Creatinine, Ser: 0.96 mg/dL (ref 0.40–1.50)
GFR: 80.28 mL/min (ref >60.00)
GLUCOSE: 95 mg/dL (ref 70–99)
POTASSIUM: 4.3 meq/L (ref 3.5–5.1)
SODIUM: 136 meq/L (ref 135–145)
Total Bilirubin: 0.5 mg/dL (ref 0.2–1.2)
Total Protein: 6.9 g/dL (ref 6.0–8.3)

## 2015-05-06 LAB — HEMOGLOBIN A1C: Hgb A1c MFr Bld: 7.2 % — ABNORMAL HIGH (ref 4.6–6.5)

## 2015-05-06 LAB — VITAMIN B12: Vitamin B-12: 423 pg/mL (ref 211–911)

## 2015-05-09 ENCOUNTER — Ambulatory Visit: Payer: Medicare Other | Admitting: Endocrinology

## 2015-05-09 ENCOUNTER — Other Ambulatory Visit: Payer: Self-pay | Admitting: Endocrinology

## 2015-05-09 DIAGNOSIS — M5416 Radiculopathy, lumbar region: Secondary | ICD-10-CM | POA: Diagnosis not present

## 2015-05-19 ENCOUNTER — Telehealth: Payer: Self-pay | Admitting: Endocrinology

## 2015-05-19 ENCOUNTER — Other Ambulatory Visit: Payer: Self-pay | Admitting: *Deleted

## 2015-05-19 MED ORDER — METFORMIN HCL 1000 MG PO TABS: 1000.0000 mg | ORAL_TABLET | Freq: Two times a day (BID) | ORAL | Status: DC

## 2015-05-19 MED ORDER — GLIMEPIRIDE 2 MG PO TABS: ORAL_TABLET | ORAL | Status: DC

## 2015-05-19 NOTE — Telephone Encounter (Signed)
rxs sent

## 2015-05-19 NOTE — Telephone Encounter (Signed)
Patient need refill of Metformin 1000 mg, Glimepiride 2 mg, send to  [facility], [place] [phone] (Phone) [phone] (Fax)

## 2015-05-26 DIAGNOSIS — H40013 Open angle with borderline findings, low risk, bilateral: Secondary | ICD-10-CM | POA: Diagnosis not present

## 2015-05-29 ENCOUNTER — Encounter: Payer: Self-pay | Admitting: Endocrinology

## 2015-05-29 ENCOUNTER — Ambulatory Visit (INDEPENDENT_AMBULATORY_CARE_PROVIDER_SITE_OTHER): Payer: Medicare Other | Admitting: Endocrinology

## 2015-05-29 VITALS — BP 142/78 | HR 69 | Temp 98.3°F | Resp 16 | Ht 68.0 in | Wt 235.8 lb

## 2015-05-29 DIAGNOSIS — Z23 Encounter for immunization: Secondary | ICD-10-CM | POA: Diagnosis not present

## 2015-05-29 DIAGNOSIS — D539 Nutritional anemia, unspecified: Secondary | ICD-10-CM

## 2015-05-29 DIAGNOSIS — E1165 Type 2 diabetes mellitus with hyperglycemia: Secondary | ICD-10-CM | POA: Diagnosis not present

## 2015-05-29 DIAGNOSIS — IMO0002 Reserved for concepts with insufficient information to code with codable children: Secondary | ICD-10-CM

## 2015-05-29 DIAGNOSIS — I10 Essential (primary) hypertension: Secondary | ICD-10-CM | POA: Diagnosis not present

## 2015-05-29 DIAGNOSIS — I2510 Atherosclerotic heart disease of native coronary artery without angina pectoris: Secondary | ICD-10-CM

## 2015-05-29 DIAGNOSIS — M5416 Radiculopathy, lumbar region: Secondary | ICD-10-CM | POA: Diagnosis not present

## 2015-05-29 NOTE — Progress Notes (Signed)
Patient ID: Todd Romero, male   DOB: 12/03/1933, 81 y.o.   MRN: [id number]    Reason for Appointment: Diabetes follow-up   History of Present Illness   Diagnosis: Type 2 DIABETES MELITUS, date of diagnosis:  1997   He has been on various regimens for his diabetes and has been on bedtime insulin with NPH since 2005 He also has benefited from adding Victoza in 2011 with better postprandial control and some weight loss Previously his weight has been as much as 247 pounds  On Victoza  since 11/14 with further improvement in blood sugar control.  However since 2015 his blood sugars have been overall high with A1c over 7% consistently. Has not benefited from increasing Victoza to 1.8 mg On this visit A1c is about the same at 7.2  Current blood sugar patterns and problems identified:  His fasting blood sugars are excellent and not fluctuating much.  He has sporadic high readings in the evenings, some before supper and some later based on his diet.  Still has difficulty losing weight and recently not able to exercise  He continues to take metformin also and Amaryl  No hypoglycemia with NPH at bedtime  Oral hypoglycemic drugs: Amaryl in the a.m. and metformin        Side effects from medications: None Insulin regimen: NPH 22 units at bedtime            Proper timing of medications in relation to meals: Yes.          Monitors blood glucose:about 2 a day.    Glucometer:  Accu-Chek         Blood Glucose readings from meter download:   Mean values apply above for all meters except median for One Touch  PRE-MEAL Fasting Lunch 5-7 PM 9--11 PM Overall  Glucose range: 90-126 129 103-189 111-202   Mean/median: 108   172 128   Physical activity: exercise: none now, previously was Walking, riding the bike a little, weight training, limited by shortness of breath and leg pain    Wt Readings from Last 3 Encounters:  05/29/15 235 lb 12.8 oz (106.958 kg)  04/03/15 232 lb  (105.235 kg)  03/20/15 226 lb (102.513 kg)   LABS: Lab Results  Component Value Date   HGBA1C 7.2* 05/06/2015   HGBA1C 7.2* 01/13/2015   HGBA1C 7.5* 10/14/2014   Lab Results  Component Value Date   MICROALBUR 4.5* 10/14/2014   LDLCALC 100* 01/13/2015   CREATININE 0.96 05/06/2015     No visits with results within 1 Week(s) from this visit. Latest known visit with results is:  Appointment on 05/06/2015  Component Date Value Ref Range Status  . Sodium 05/06/2015 136  135 - 145 mEq/L Final  . Potassium 05/06/2015 4.3  3.5 - 5.1 mEq/L Final  . Chloride 05/06/2015 103  96 - 112 mEq/L Final  . CO2 05/06/2015 25  19 - 32 mEq/L Final  . Glucose, Bld 05/06/2015 95  70 - 99 mg/dL Final  . BUN 05/06/2015 20  6 - 23 mg/dL Final  . Creatinine, Ser 05/06/2015 0.96  0.40 - 1.50 mg/dL Final  . Total Bilirubin 05/06/2015 0.5  0.2 - 1.2 mg/dL Final  . Alkaline Phosphatase 05/06/2015 52  39 - 117 U/L Final  . AST 05/06/2015 22  0 - 37 U/L Final  . ALT 05/06/2015 19  0 - 53 U/L Final  . Total Protein 05/06/2015 6.9  6.0 - 8.3 g/dL Final  .  Albumin 05/06/2015 4.0  3.5 - 5.2 g/dL Final  . Calcium 05/06/2015 9.9  8.4 - 10.5 mg/dL Final  . GFR 05/06/2015 80.28  >60.00 mL/min Final  . Vitamin B-12 05/06/2015 423  211 - 911 pg/mL Final  . Hgb A1c MFr Bld 05/06/2015 7.2* 4.6 - 6.5 % Final   Glycemic Control Guidelines for People with Diabetes:Non Diabetic:  <6%Goal of Therapy: <7%Additional Action Suggested:  >8%       Medication List       This list is accurate as of: 05/29/15  2:55 PM.  Always use your most recent med list.               ACCU-CHEK AVIVA PLUS test strip  Generic drug:  glucose blood  USE AS INSTRUCTED TO CHECK BLOOD SUGAR TWICE A DAY (CODE 250.00)     amLODipine 10 MG tablet  Commonly known as:  NORVASC  TAKE ONE TABLET DAILY IN THE EVENING     aspirin 81 MG tablet  Take 81 mg by mouth daily.     ferrous sulfate 325 (65 FE) MG tablet  Take 325 mg by mouth daily  with breakfast.     fish oil-omega-3 fatty acids 1000 MG capsule  Take 2 g by mouth daily.     glimepiride 2 MG tablet  Commonly known as:  AMARYL  1/2 tablet once a day     ibuprofen 200 MG tablet  Commonly known as:  ADVIL,MOTRIN  Take 200 mg by mouth every 6 (six) hours as needed.     insulin NPH Human 100 UNIT/ML injection  Commonly known as:  HUMULIN N,NOVOLIN N  Inject 0.22 mLs (22 Units total) into the skin at bedtime. Uses Vial     Insulin Pen Needle 31G X 5 MM Misc  Commonly known as:  B-D UF III MINI PEN NEEDLES  Use 1 pen needle per day     Insulin Syringe-Needle U-100 31G X 5/16" 1 ML Misc  Commonly known as:  BD INSULIN SYRINGE ULTRAFINE  Use to inject insulin daily     Liraglutide 18 MG/3ML Sopn  Commonly known as:  VICTOZA  Inject 0.3 mLs (1.8 mg total) into the skin daily.     metFORMIN 1000 MG tablet  Commonly known as:  GLUCOPHAGE  Take 1 tablet (1,000 mg total) by mouth 2 (two) times daily with a meal.     metoprolol succinate 25 MG 24 hr tablet  Commonly known as:  TOPROL-XL  Take 1 tablet (25 mg total) by mouth daily. Take with or immediately following a meal.     multivitamin with minerals Tabs tablet  Take 1 tablet by mouth daily.     olmesartan-hydrochlorothiazide 40-12.5 MG per tablet  Commonly known as:  BENICAR HCT  Take 1 tablet by mouth daily.     pravastatin 40 MG tablet  Commonly known as:  PRAVACHOL  Take 1 tablet (40 mg total) by mouth daily.     PRESERVISION/LUTEIN Caps  Take by mouth daily.     tamsulosin 0.4 MG Caps capsule  Commonly known as:  FLOMAX  Take 1 capsule (0.4 mg total) by mouth daily.        Allergies:  Allergies  Allergen Reactions  . Morphine And Related Nausea And Vomiting    Past Medical History  Diagnosis Date  . Unspecified essential hypertension   . Pure hypercholesterolemia   . Type II or unspecified type diabetes mellitus without mention of complication, not stated as  uncontrolled   . RBBB     . CAD (coronary artery disease)   . Diabetes   . Hyperlipidemia     Past Surgical History  Procedure Laterality Date  . Vein surgery    . Anterior cruciate ligament repair    . Carpal tunnel release      Family History  Problem Relation Age of Onset  . Heart failure Father   . Emphysema Father   . Hypertension Mother   . Cancer Sister   . Healthy Sister     Social History:  reports that he has never smoked. He has never used smokeless tobacco. His alcohol and drug histories are not on file.  Review of Systems:  HYPERTENSION:  Blood pressure is stable He has not checked blood pressure regularly at home and none recently Still on Benicar HCT unchanged Has slight recent pedal edema after increasing the amlodipine dose to 10 mg  HYPERLIPIDEMIA: The lipid abnormality consists of elevated LDL and he is taking 40 mg Pravachol, no side effects.  Lab Results  Component Value Date   CHOL 184 01/13/2015   HDL 58.50 01/13/2015   LDLCALC 100* 01/13/2015   TRIG 129.0 01/13/2015   CHOLHDL 3 01/13/2015   History of daytime fatigability and somnolence if sitting still.   He had an abnormal sleep study; having titration and not able to get full benefit yet  Has had mild snoring; also has late insomnia    Lab Results  Component Value Date   TSH 0.81 01/13/2015   Still getting shortness of breath on exertion, this occurs with walking or doing at work. No specific etiology found by pulmonologist  History of overactive bladder for which he takes Enablex only when he is traveling  Mild anemia: He is taking iron supplements 3 times a week and has had stable hemoglobin levels near normal  No history of B-12 deficiency and the level is normal  Lab Results  Component Value Date   WBC 7.7 01/13/2015   HGB 12.5* 01/13/2015   HCT 36.8* 01/13/2015   MCV 89.0 01/13/2015   PLT 193.0 01/13/2015   Foot exam 9/16   Examination:   BP 142/78 mmHg  Pulse 69  Temp(Src) 98.3 F (36.8  C)  Resp 16  Ht 5\' 8"  (1.727 m)  Wt 235 lb 12.8 oz (106.958 kg)  BMI 35.86 kg/m2  SpO2 96%  Body mass index is 35.86 kg/(m^2).   No significant pedal edema present    Diabetic foot exam shows normal monofilament sensation in the toes and plantar surfaces, no skin lesions or ulcers on the feet and absent pedal pulses on the right  ASSESSMENT/ PLAN:   Diabetes type 2 with obesity   The patient's diabetes control appears to be overall fair considering his age and duration of diabetes.  Has stable A1c of 7.2 and  Was improved with increasing his Victoza up to 1.8 mg However he is still having some postprandial hyperglycemia after evening meal based on what he is eating Fasting blood sugars are excellent without overnight hypoglycemia with stable dose of bedtime NPH Currently not able to exercise and has difficulty losing weight Also discussed that his sugars may go up with his epidural steroid that he had today and he will call if he has any difficulties with significant hyperglycemia  For now we will continue his treatment regimen unchanged  DYSPNEA on exertion and sleep apnea, followed by pulmonologist now  HYPERTENSION: Blood pressure is  better now with  increasing amlodipine  KUMAR,AJAY 05/29/2015, 2:55 PM

## 2015-05-29 NOTE — Patient Instructions (Signed)
May need to go up on insulin with steroid shots  Check blood sugars on waking up .. 3 .. times a week Also check blood sugars about 2 hours after a meal and do this after different meals by rotation  Recommended blood sugar levels on waking up is 90-130 and about 2 hours after meal is 140-180 Please bring blood sugar monitor to each visit.

## 2015-06-06 DIAGNOSIS — M1711 Unilateral primary osteoarthritis, right knee: Secondary | ICD-10-CM | POA: Diagnosis not present

## 2015-06-12 ENCOUNTER — Encounter: Payer: Self-pay | Admitting: Cardiology

## 2015-06-13 DIAGNOSIS — M4317 Spondylolisthesis, lumbosacral region: Secondary | ICD-10-CM | POA: Diagnosis not present

## 2015-06-18 ENCOUNTER — Encounter: Payer: Self-pay | Admitting: Cardiology

## 2015-06-18 ENCOUNTER — Ambulatory Visit (INDEPENDENT_AMBULATORY_CARE_PROVIDER_SITE_OTHER): Payer: Medicare Other | Admitting: Cardiology

## 2015-06-18 VITALS — BP 160/74 | HR 66 | Ht 68.0 in | Wt 240.0 lb

## 2015-06-18 DIAGNOSIS — I2510 Atherosclerotic heart disease of native coronary artery without angina pectoris: Secondary | ICD-10-CM

## 2015-06-18 DIAGNOSIS — E669 Obesity, unspecified: Secondary | ICD-10-CM | POA: Diagnosis not present

## 2015-06-18 DIAGNOSIS — I10 Essential (primary) hypertension: Secondary | ICD-10-CM | POA: Diagnosis not present

## 2015-06-18 DIAGNOSIS — G4733 Obstructive sleep apnea (adult) (pediatric): Secondary | ICD-10-CM

## 2015-06-18 HISTORY — DX: Obesity, unspecified: E66.9

## 2015-06-18 NOTE — Patient Instructions (Signed)
Medication Instructions:  Your physician recommends that you continue on your current medications as directed. Please refer to the Current Medication list given to you today.   Labwork: None  Testing/Procedures: Dr. Radford Pax recommends you have a 2 week auto-titration for your PAP. [facility] will be in touch with you soon to arrange this. If you do not hear from them in the next week, please contact Romelle Starcher, [place], our office CPAP assistant. Her direct phone number is [phone].  Follow-Up: Your physician wants you to follow-up in: 6 months with Dr. Radford Pax. You will receive a reminder letter in the mail two months in advance. If you don't receive a letter, please call our office to schedule the follow-up appointment.   Any Other Special Instructions Will Be Listed Below (If Applicable).

## 2015-06-18 NOTE — Progress Notes (Signed)
Cardiology Office Note   Date:  06/18/2015   ID:  Todd Romero, DOB 10/02/1933, MRN [id number]  PCP:  [name], MD    Chief Complaint  Patient presents with  . Sleep Apnea  . Hypertension      History of Present Illness: Todd Romero is a 81 y.o. male who presents for evaluation of OSA.  He was referred by Dr. Tamala Julian for evaluation of snoring and excessive daytime sleepiness.  His Epworth Sleepiness score was 9.  He was found to have severe OSA with an AHI of 27/hr with most events occurring in REM sleep in the non supine position.  Oxygen saturations dropped to 79%.  He underwent CPAP titration to 9cm H2O.  He is doing well with his CPAP.  He tolerates the full face mask.  He says that he feels the pressure is not enough.  His wife says that he is not snoring when wearing the mask.  Since starting on CPAP, he feels more rested in the am but still has some daytime sleepiness.      Past Medical History  Diagnosis Date  . Unspecified essential hypertension   . Pure hypercholesterolemia   . Type II or unspecified type diabetes mellitus without mention of complication, not stated as uncontrolled   . RBBB   . CAD (coronary artery disease)   . Diabetes (Todd Romero)   . Hyperlipidemia   . Obesity (BMI 30-39.9) 06/18/2015    Past Surgical History  Procedure Laterality Date  . Vein surgery    . Anterior cruciate ligament repair    . Carpal tunnel release       Current Outpatient Prescriptions  Medication Sig Dispense Refill  . ACCU-CHEK AVIVA PLUS test strip USE AS INSTRUCTED TO CHECK BLOOD SUGAR TWICE A DAY (CODE 250.00) 200 each 3  . amLODipine (NORVASC) 10 MG tablet TAKE ONE TABLET DAILY IN THE EVENING (Patient taking differently: Take 10 mg by mouth daily. TAKE ONE TABLET DAILY IN THE EVENING) 90 tablet 3  . aspirin 81 MG tablet Take 81 mg by mouth daily.    . ferrous sulfate 325 (65 FE) MG tablet Take 325 mg by mouth daily with breakfast.    .  fish oil-omega-3 fatty acids 1000 MG capsule Take 2 g by mouth daily.    [name] glimepiride (AMARYL) 2 MG tablet 1/2 tablet once a day (Patient taking differently: Take 2 mg by mouth daily with breakfast. 1/2 tablet once a day) 45 tablet 1  . ibuprofen (ADVIL,MOTRIN) 200 MG tablet Take 200 mg by mouth every 6 (six) hours as needed for headache or moderate pain.     [name] insulin NPH Human (HUMULIN N,NOVOLIN N) 100 UNIT/ML injection Inject 0.22 mLs (22 Units total) into the skin at bedtime. Uses Vial 20 mL 3  . Insulin Pen Needle (B-D UF III MINI PEN NEEDLES) 31G X 5 MM MISC Use 1 pen needle per day 30 each 5  . Insulin Syringe-Needle U-100 (BD INSULIN SYRINGE ULTRAFINE) 31G X 5/16" 1 ML MISC Use to inject insulin daily 100 each 5  . Liraglutide (VICTOZA) 18 MG/3ML SOPN Inject 0.3 mLs (1.8 mg total) into the skin daily. 9 pen 1  . metFORMIN (GLUCOPHAGE) 1000 MG tablet Take 1 tablet (1,000 mg total) by mouth 2 (two) times daily with a meal. 180 tablet 1  . metoprolol succinate (TOPROL-XL) 25 MG 24 hr tablet Take  1 tablet (25 mg total) by mouth daily. Take with or immediately following a meal. 90 tablet 3  . Multiple Vitamin (MULTIVITAMIN WITH MINERALS) TABS tablet Take 1 tablet by mouth daily.    . Multiple Vitamins-Minerals (PRESERVISION/LUTEIN) CAPS Take by mouth daily.    [name] olmesartan-hydrochlorothiazide (BENICAR HCT) 40-12.5 MG per tablet Take 1 tablet by mouth daily. 90 tablet 1  . pravastatin (PRAVACHOL) 40 MG tablet Take 1 tablet (40 mg total) by mouth daily. 90 tablet 1  . tamsulosin (FLOMAX) 0.4 MG CAPS capsule Take 1 capsule (0.4 mg total) by mouth daily. 90 capsule 1   No current facility-administered medications for this visit.    Allergies:   Morphine and related    Social History:  The patient  reports that he has never smoked. He has never used smokeless tobacco.   Family History:  The patient's family history includes Cancer in his sister; Emphysema in his father; Healthy in his sister;  Heart failure in his father; Hypertension in his mother.    ROS:  Please see the history of present illness.   Otherwise, review of systems are positive for none.   All other systems are reviewed and negative.    PHYSICAL EXAM: VS:  BP 160/74 mmHg  Pulse 66  Ht 5\' 8"  (1.727 m)  Wt 240 lb (108.863 kg)  BMI 36.50 kg/m2  SpO2 98% , BMI Body mass index is 36.5 kg/(m^2). GEN: Well nourished, well developed, in no acute distress HEENT: normal Neck: no JVD, carotid bruits, or masses Cardiac: RRR; no murmurs, rubs, or gallops.  Trace edema Respiratory:  clear to auscultation bilaterally, normal work of breathing GI: soft, nontender, nondistended, + BS MS: no deformity or atrophy Skin: warm and dry, no rash Neuro:  Strength and sensation are intact Psych: euthymic mood, full affect   EKG:  EKG is not ordered today.    Recent Labs: 07/25/2014: Pro B Natriuretic peptide (BNP) 22.0 01/13/2015: Hemoglobin 12.5*; Platelets 193.0; TSH 0.81 05/06/2015: ALT 19; BUN 20; Creatinine, Ser 0.96; Potassium 4.3; Sodium 136    Lipid Panel    Component Value Date/Time   CHOL 184 01/13/2015 0853   TRIG 129.0 01/13/2015 0853   HDL 58.50 01/13/2015 0853   CHOLHDL 3 01/13/2015 0853   VLDL 25.8 01/13/2015 0853   LDLCALC 100* 01/13/2015 0853      Wt Readings from Last 3 Encounters:  06/18/15 240 lb (108.863 kg)  05/29/15 235 lb 12.8 oz (106.958 kg)  04/03/15 232 lb (105.235 kg)      ASSESSMENT AND PLAN:  1.  Severe OSA with an AHI of 27/hr and now on CPAP at 9cm H2O and tolerating well.  Patient has been using and benefiting from CPAP use and will continue to benefit from therapy. His d/l today showed an AHI of 9.8/hr on 9cm H2O and 100% compliance in using more than 4 hours nightly.  Since his AHI is elevated I am going to get a 2 week autotitration from 4 to 20cm H2O to determined the best pressure. 2.  HTN - borderline controlled on amlodipine/ARB/BB.  He says that his BP is not that high at  home.   3.  Obesity - I have encouraged him to get into a routine exercise progr   Current medicines are reviewed at length with the patient today.  The patient does not have concerns regarding medicines.  The following changes have been made:  no change  Labs/ tests ordered today: See above Assessment and  Plan  Orders Placed This Encounter  Procedures  . DME Other see comment     Disposition:   FU with me in 6 months  Signed, Sueanne Margarita, MD  06/18/2015 9:00 AM    [address], [place], [place]  [address] Phone: [phone]; Fax: [phone]

## 2015-06-26 ENCOUNTER — Encounter: Payer: Self-pay | Admitting: Cardiology

## 2015-06-30 DIAGNOSIS — M4316 Spondylolisthesis, lumbar region: Secondary | ICD-10-CM | POA: Diagnosis not present

## 2015-07-03 ENCOUNTER — Other Ambulatory Visit: Payer: Self-pay | Admitting: *Deleted

## 2015-07-03 ENCOUNTER — Telehealth: Payer: Self-pay | Admitting: Endocrinology

## 2015-07-03 DIAGNOSIS — M4316 Spondylolisthesis, lumbar region: Secondary | ICD-10-CM | POA: Diagnosis not present

## 2015-07-03 MED ORDER — PRAVASTATIN SODIUM 40 MG PO TABS: 40.0000 mg | ORAL_TABLET | Freq: Every day | ORAL | Status: DC

## 2015-07-03 NOTE — Telephone Encounter (Signed)
Please send

## 2015-07-03 NOTE — Telephone Encounter (Signed)
rx sent

## 2015-07-03 NOTE — Telephone Encounter (Signed)
Patient need a refill of pravastatin, send to      [facility], [place] [phone] (Phone) [phone] (Fax)

## 2015-07-07 DIAGNOSIS — M4316 Spondylolisthesis, lumbar region: Secondary | ICD-10-CM | POA: Diagnosis not present

## 2015-07-09 DIAGNOSIS — M4316 Spondylolisthesis, lumbar region: Secondary | ICD-10-CM | POA: Diagnosis not present

## 2015-07-11 DIAGNOSIS — M4316 Spondylolisthesis, lumbar region: Secondary | ICD-10-CM | POA: Diagnosis not present

## 2015-07-14 DIAGNOSIS — M4316 Spondylolisthesis, lumbar region: Secondary | ICD-10-CM | POA: Diagnosis not present

## 2015-07-16 DIAGNOSIS — M4316 Spondylolisthesis, lumbar region: Secondary | ICD-10-CM | POA: Diagnosis not present

## 2015-07-18 DIAGNOSIS — M4316 Spondylolisthesis, lumbar region: Secondary | ICD-10-CM | POA: Diagnosis not present

## 2015-07-21 ENCOUNTER — Encounter: Payer: Self-pay | Admitting: Cardiology

## 2015-07-22 DIAGNOSIS — M4316 Spondylolisthesis, lumbar region: Secondary | ICD-10-CM | POA: Diagnosis not present

## 2015-07-24 DIAGNOSIS — M4316 Spondylolisthesis, lumbar region: Secondary | ICD-10-CM | POA: Diagnosis not present

## 2015-07-28 DIAGNOSIS — M4316 Spondylolisthesis, lumbar region: Secondary | ICD-10-CM | POA: Diagnosis not present

## 2015-07-30 DIAGNOSIS — M4316 Spondylolisthesis, lumbar region: Secondary | ICD-10-CM | POA: Diagnosis not present

## 2015-08-04 ENCOUNTER — Other Ambulatory Visit: Payer: Self-pay | Admitting: *Deleted

## 2015-08-04 MED ORDER — INSULIN PEN NEEDLE 31G X 5 MM MISC: Status: DC

## 2015-08-05 DIAGNOSIS — M4316 Spondylolisthesis, lumbar region: Secondary | ICD-10-CM | POA: Diagnosis not present

## 2015-08-07 DIAGNOSIS — M4316 Spondylolisthesis, lumbar region: Secondary | ICD-10-CM | POA: Diagnosis not present

## 2015-08-25 DIAGNOSIS — M1711 Unilateral primary osteoarthritis, right knee: Secondary | ICD-10-CM | POA: Diagnosis not present

## 2015-09-10 NOTE — Progress Notes (Signed)
Cardiology Office Note   Date:  09/11/2015   ID:  Janice Coffin [place], DOB [birth year], MRN [id number]  PCP:  [name], MD  Cardiologist:  Sinclair Grooms, MD   Chief Complaint  Patient presents with  . Hypertension      History of Present Illness: Todd Romero is a 82 y.o. male who presents for CAD treated medically (see problem list for anatomy), bifascicular block with right bundle branch block and first-degree AV block, hypertension, diabetes mellitus, and hyperlipidemia.  He has severe sleep apnea. He hates his C Pap mask and wears it rarely because he feels that he smothers. He was to hear back from Dr. Radford Pax concerning C Pap on her titration performed in October. He says he has not heard any follow-up. I believe he has stopped wearing his mask.  He has noticed some lower extremity swelling. He is on amlodipine 10 mg per day. There is some orthopnea if he wears a C Pap mask. He has significant dyspnea on exertion.  Past Medical History  Diagnosis Date  . Unspecified essential hypertension   . Pure hypercholesterolemia   . Type II or unspecified type diabetes mellitus without mention of complication, not stated as uncontrolled   . RBBB   . CAD (coronary artery disease)   . Diabetes ([place])   . Hyperlipidemia   . Obesity (BMI 30-39.9) 06/18/2015    Past Surgical History  Procedure Laterality Date  . Vein surgery    . Anterior cruciate ligament repair    . Carpal tunnel release       Current Outpatient Prescriptions  Medication Sig Dispense Refill  . ACCU-CHEK AVIVA PLUS test strip USE AS INSTRUCTED TO CHECK BLOOD SUGAR TWICE A DAY (CODE 250.00) 200 each 3  . amLODipine (NORVASC) 10 MG tablet Take 10 mg by mouth daily.    [name] aspirin 81 MG tablet Take 81 mg by mouth daily.    . ferrous sulfate 325 (65 FE) MG tablet Take 325 mg by mouth daily with breakfast.    . fish oil-omega-3 fatty acids 1000 MG capsule Take 2 g by mouth daily.    [name] glimepiride  (AMARYL) 2 MG tablet Take 1 mg by mouth daily.    [name] ibuprofen (ADVIL,MOTRIN) 200 MG tablet Take 200 mg by mouth every 6 (six) hours as needed for headache or moderate pain.     [name] insulin NPH Human (HUMULIN N,NOVOLIN N) 100 UNIT/ML injection Inject 0.22 mLs (22 Units total) into the skin at bedtime. Uses Vial 20 mL 3  . Insulin Pen Needle (B-D UF III MINI PEN NEEDLES) 31G X 5 MM MISC Use 1 pen needle per day 100 each 2  . Insulin Syringe-Needle U-100 (BD INSULIN SYRINGE ULTRAFINE) 31G X 5/16" 1 ML MISC Use to inject insulin daily 100 each 5  . Liraglutide (VICTOZA) 18 MG/3ML SOPN Inject 0.3 mLs (1.8 mg total) into the skin daily. 9 pen 1  . metFORMIN (GLUCOPHAGE) 1000 MG tablet Take 1 tablet (1,000 mg total) by mouth 2 (two) times daily with a meal. 180 tablet 1  . metoprolol succinate (TOPROL-XL) 25 MG 24 hr tablet Take 1 tablet (25 mg total) by mouth daily. Take with or immediately following a meal. 90 tablet 3  . Multiple Vitamin (MULTIVITAMIN WITH MINERALS) TABS tablet Take 1 tablet by mouth daily.    . Multiple Vitamins-Minerals (PRESERVISION/LUTEIN) CAPS Take by mouth daily.    [name] olmesartan-hydrochlorothiazide (BENICAR HCT) 40-12.5 MG per tablet Take 1 tablet  by mouth daily. 90 tablet 1  . pravastatin (PRAVACHOL) 40 MG tablet Take 1 tablet (40 mg total) by mouth daily. 90 tablet 1  . tamsulosin (FLOMAX) 0.4 MG CAPS capsule Take 1 capsule (0.4 mg total) by mouth daily. 90 capsule 1   No current facility-administered medications for this visit.    Allergies:   Morphine and related    Social History:  The patient  reports that he has never smoked. He has never used smokeless tobacco.   Family History:  The patient's family history includes Cancer in his sister; Emphysema in his father; Healthy in his sister; Heart failure in his father; Hypertension in his mother.    ROS:  Please see the history of present illness.   Otherwise, review of systems are positive for dyspnea on exertion,  shortness of breath with activity. Difficulty with balance..   All other systems are reviewed and negative.    PHYSICAL EXAM: VS:  BP 148/72 mmHg  Pulse 60  Ht 5\' 9"  (1.753 m)  Wt 241 lb 6.4 oz (109.498 kg)  BMI 35.63 kg/m2 , BMI Body mass index is 35.63 kg/(m^2). GEN: Well nourished, well developed, in no acute distress HEENT: normal Neck: no JVD, carotid bruits, or masses Cardiac: RRR.  There is no murmur, rub, or gallop. There is no edema. Respiratory:  clear to auscultation bilaterally, normal work of breathing. GI: soft, nontender, nondistended, + BS MS: no deformity or atrophy Skin: warm and dry, no rash Neuro:  Strength and sensation are intact Psych: euthymic mood, full affect   EKG:  EKG is ordered today. The ekg reveals right bundle branch block, normal sinus rhythm, first-degree AV block (258 ms)   Recent Labs: 01/13/2015: Hemoglobin 12.5*; Platelets 193.0; TSH 0.81 05/06/2015: ALT 19; BUN 20; Creatinine, Ser 0.96; Potassium 4.3; Sodium 136    Lipid Panel    Component Value Date/Time   CHOL 184 01/13/2015 0853   TRIG 129.0 01/13/2015 0853   HDL 58.50 01/13/2015 0853   CHOLHDL 3 01/13/2015 0853   VLDL 25.8 01/13/2015 0853   LDLCALC 100* 01/13/2015 0853      Wt Readings from Last 3 Encounters:  09/11/15 241 lb 6.4 oz (109.498 kg)  06/18/15 240 lb (108.863 kg)  05/29/15 235 lb 12.8 oz (106.958 kg)      Other studies Reviewed: Additional studies/ records that were reviewed today include: Sleep office notes.. The findings include seems to be some communication issue here..    ASSESSMENT AND PLAN:  1. Essential hypertension, benign Borderline elevation  2. Coronary artery disease involving native coronary artery of native heart without angina pectoris Cardiac catheterization 2013 demonstrating total occlusion of diagonal and 90% obstruction of circumflex PDA.  3. RBBB Now in addition has first-degree AV block  4. Pure hypercholesterolemia Followed  by primary care  5. Obstructive sleep apnea Not being adequately treated    Current medicines are reviewed at length with the patient today.  The patient has the following concerns regarding medicines: None.  The following changes/actions have been instituted:    Increase hydrochlorothiazide to 25 mg per day the Benicar HCT 40/25 mg daily  Metabolic panel in one to 2 weeks  Follow-up sleep auto titration with adjustment in therapy as needed to make facemask more comfortable and patient more compliant. We will refer this back to Dr. Radford Pax.  Labs/ tests ordered today include:  No orders of the defined types were placed in this encounter.     Disposition:   FU  with HS in 1 year  Signed, Sinclair Grooms, MD  09/11/2015 11:28 AM    [facility] [address], [place], [place]  [address] Phone: [phone]; Fax: [phone]

## 2015-09-11 ENCOUNTER — Encounter: Payer: Self-pay | Admitting: Interventional Cardiology

## 2015-09-11 ENCOUNTER — Ambulatory Visit (INDEPENDENT_AMBULATORY_CARE_PROVIDER_SITE_OTHER): Payer: Medicare Other | Admitting: Interventional Cardiology

## 2015-09-11 VITALS — BP 148/72 | HR 60 | Ht 69.0 in | Wt 241.4 lb

## 2015-09-11 DIAGNOSIS — I4510 Unspecified right bundle-branch block: Secondary | ICD-10-CM

## 2015-09-11 DIAGNOSIS — G4733 Obstructive sleep apnea (adult) (pediatric): Secondary | ICD-10-CM

## 2015-09-11 DIAGNOSIS — E7800 Pure hypercholesterolemia, unspecified: Secondary | ICD-10-CM | POA: Diagnosis not present

## 2015-09-11 DIAGNOSIS — I10 Essential (primary) hypertension: Secondary | ICD-10-CM

## 2015-09-11 DIAGNOSIS — I2510 Atherosclerotic heart disease of native coronary artery without angina pectoris: Secondary | ICD-10-CM

## 2015-09-11 MED ORDER — HYDROCHLOROTHIAZIDE 25 MG PO TABS: 25.0000 mg | ORAL_TABLET | Freq: Every day | ORAL | Status: DC

## 2015-09-11 MED ORDER — OLMESARTAN MEDOXOMIL-HCTZ 40-25 MG PO TABS: 1.0000 | ORAL_TABLET | Freq: Every day | ORAL | Status: DC

## 2015-09-11 MED ORDER — HYDROCHLOROTHIAZIDE 12.5 MG PO TABS: 12.5000 mg | ORAL_TABLET | Freq: Every day | ORAL | Status: DC

## 2015-09-11 NOTE — Patient Instructions (Addendum)
Medication Instructions:  Your physician has recommended you make the following change in your medication:  1) ADD HCTZ 12.5mg  daily to your daily regimen and complete your Benicar HCT 40-12.5mg  daily supply at home 2) THEN START Benicar HCT 40-25mg  daily an Rx has been sent to your pharmacy   Labwork: Your physician recommends that you return for lab work in: 10-14 days ([facility])    Testing/Procedures: None ordered   Follow-Up: Your physician wants you to follow-up in: 1 year with Dr.Smith You will receive a reminder letter in the mail two months in advance. If you don't receive a letter, please call our office to schedule the follow-up appointment.    Any Other Special Instructions Will Be Listed Below (If Applicable). Call the office in 2-3 weeks to give an update on your shortness of breatg     If you need a refill on your cardiac medications before your next appointment, please call your pharmacy.

## 2015-09-16 ENCOUNTER — Other Ambulatory Visit: Payer: Self-pay | Admitting: *Deleted

## 2015-09-16 ENCOUNTER — Telehealth: Payer: Self-pay | Admitting: Endocrinology

## 2015-09-16 MED ORDER — LIRAGLUTIDE 18 MG/3ML ~~LOC~~ SOPN: 1.8000 mg | PEN_INJECTOR | Freq: Every day | SUBCUTANEOUS | Status: DC

## 2015-09-16 NOTE — Telephone Encounter (Signed)
Patient called stating that he would like a refill on his Rx   [id number]  Ph[facility]    Thank you

## 2015-09-16 NOTE — Telephone Encounter (Signed)
rx sent

## 2015-09-23 ENCOUNTER — Encounter: Payer: Self-pay | Admitting: Cardiology

## 2015-09-24 ENCOUNTER — Other Ambulatory Visit (INDEPENDENT_AMBULATORY_CARE_PROVIDER_SITE_OTHER): Payer: Medicare Other

## 2015-09-24 DIAGNOSIS — E1165 Type 2 diabetes mellitus with hyperglycemia: Secondary | ICD-10-CM

## 2015-09-24 DIAGNOSIS — IMO0002 Reserved for concepts with insufficient information to code with codable children: Secondary | ICD-10-CM

## 2015-09-24 DIAGNOSIS — D539 Nutritional anemia, unspecified: Secondary | ICD-10-CM | POA: Diagnosis not present

## 2015-09-24 LAB — COMPREHENSIVE METABOLIC PANEL
ALT: 13 U/L (ref 0–53)
AST: 15 U/L (ref 0–37)
Albumin: 4.1 g/dL (ref 3.5–5.2)
Alkaline Phosphatase: 54 U/L (ref 39–117)
BUN: 24 mg/dL — ABNORMAL HIGH (ref 6–23)
CALCIUM: 9.5 mg/dL (ref 8.4–10.5)
CO2: 26 meq/L (ref 19–32)
Chloride: 102 mEq/L (ref 96–112)
Creatinine, Ser: 1.04 mg/dL (ref 0.40–1.50)
GFR: 73.13 mL/min (ref >60.00)
GLUCOSE: 113 mg/dL — AB (ref 70–99)
Potassium: 4.2 mEq/L (ref 3.5–5.1)
Sodium: 136 mEq/L (ref 135–145)
Total Bilirubin: 0.5 mg/dL (ref 0.2–1.2)
Total Protein: 6.8 g/dL (ref 6.0–8.3)

## 2015-09-24 LAB — CBC
HCT: 38.6 % — ABNORMAL LOW (ref 39.0–52.0)
HEMOGLOBIN: 12.6 g/dL — AB (ref 13.0–17.0)
MCHC: 32.5 g/dL (ref 30.0–36.0)
MCV: 91.3 fl (ref 78.0–100.0)
PLATELETS: 186 10*3/uL (ref 150.0–400.0)
RBC: 4.23 Mil/uL (ref 4.22–5.81)
RDW: 14.3 % (ref 11.5–15.5)
WBC: 7.2 10*3/uL (ref 4.0–10.5)

## 2015-09-24 LAB — HEMOGLOBIN A1C: HEMOGLOBIN A1C: 7.3 % — AB (ref 4.6–6.5)

## 2015-09-29 ENCOUNTER — Telehealth: Payer: Self-pay | Admitting: Cardiology

## 2015-09-29 ENCOUNTER — Ambulatory Visit (INDEPENDENT_AMBULATORY_CARE_PROVIDER_SITE_OTHER): Payer: Medicare Other | Admitting: Endocrinology

## 2015-09-29 ENCOUNTER — Other Ambulatory Visit: Payer: Self-pay | Admitting: *Deleted

## 2015-09-29 ENCOUNTER — Encounter: Payer: Self-pay | Admitting: Endocrinology

## 2015-09-29 ENCOUNTER — Other Ambulatory Visit (INDEPENDENT_AMBULATORY_CARE_PROVIDER_SITE_OTHER): Payer: Medicare Other | Admitting: *Deleted

## 2015-09-29 VITALS — BP 124/68 | HR 60 | Temp 98.5°F | Resp 16 | Ht 69.0 in | Wt 232.8 lb

## 2015-09-29 DIAGNOSIS — E785 Hyperlipidemia, unspecified: Secondary | ICD-10-CM

## 2015-09-29 DIAGNOSIS — I10 Essential (primary) hypertension: Secondary | ICD-10-CM | POA: Diagnosis not present

## 2015-09-29 DIAGNOSIS — Z794 Long term (current) use of insulin: Secondary | ICD-10-CM | POA: Diagnosis not present

## 2015-09-29 DIAGNOSIS — E1165 Type 2 diabetes mellitus with hyperglycemia: Secondary | ICD-10-CM | POA: Diagnosis not present

## 2015-09-29 DIAGNOSIS — D509 Iron deficiency anemia, unspecified: Secondary | ICD-10-CM

## 2015-09-29 LAB — BASIC METABOLIC PANEL
BUN: 31 mg/dL — AB (ref 7–25)
CHLORIDE: 101 mmol/L (ref 98–110)
CO2: 21 mmol/L (ref 20–31)
CREATININE: 1.01 mg/dL (ref 0.70–1.18)
Calcium: 9.7 mg/dL (ref 8.6–10.3)
Glucose, Bld: 93 mg/dL (ref 65–99)
Potassium: 4.3 mmol/L (ref 3.5–5.3)
Sodium: 136 mmol/L (ref 135–146)

## 2015-09-29 MED ORDER — TAMSULOSIN HCL 0.4 MG PO CAPS: 0.4000 mg | ORAL_CAPSULE | Freq: Every day | ORAL | Status: DC

## 2015-09-29 NOTE — Addendum Note (Signed)
Addended by: Eulis Foster on: 09/29/2015 08:03 AM   Modules accepted: Orders

## 2015-09-29 NOTE — Patient Instructions (Signed)
Take 20 units insulin at bedtime  Check blood sugars on waking up 2  times a week Also check blood sugars about 2 hours after a meal and do this after different meals by rotation  Recommended blood sugar levels on waking up is 90-130 and about 2 hours after meal is 130-160  Please bring your blood sugar monitor to each visit, thank you

## 2015-09-29 NOTE — Telephone Encounter (Signed)
New message      Did we receive pts 2 wk auto titration study on his cpap  Machine?

## 2015-09-29 NOTE — Progress Notes (Signed)
Patient ID: Todd Romero, male   DOB: 08-26-1934, 82 y.o.   MRN: [id number]    Reason for Appointment: Diabetes follow-up   History of Present Illness   Diagnosis: Type 2 DIABETES MELITUS, date of diagnosis:  1997   He has been on various regimens for his diabetes and has been on bedtime insulin with NPH since 2005 He also has benefited from adding Victoza in 2011 with better postprandial control and some weight loss Previously his weight has been as much as 247 pounds  On Victoza  since 11/14 with further improvement in blood sugar control.  Since about 2015 his blood sugars have been overall mildly high with A1c over 7% consistently. Has not benefited from increasing Victoza to 1.8 mg On this visit A1c is about the same at 7.3  Current blood sugar patterns and problems identified:  His fasting blood sugars are excellent and near normal  No nocturnal hypoglycemia reported  He has sporadic high readings in the evenings, some before supper and some at bedtime  When eating out or with foods that are higher in carbohydrate or fat his blood sugars may occasionally go up over 200  He is trying to walk a little but limited by shortness of breath  has difficulty losing weight and has lost a couple of pounds since last visit  He continues to take metformin also and Amaryl  Oral hypoglycemic drugs: Amaryl in the a.m. and metformin        Side effects from medications: None Insulin regimen: NPH 22 units at bedtime            Proper timing of medications in relation to meals: Yes.          Monitors blood glucose:about 2x a day.    Glucometer:  Accu-Chek         Blood Glucose readings from meter download:   Mean values apply above for all meters except median for One Touch  PRE-MEAL Fasting Lunch Dinner Bedtime Overall  Glucose range:  74-116    91-200   110-222    Mean/median:  106    150   160   131     Physical activity: exercise:  Walking, riding the bike  occasionally, limited by shortness of breath Meal times: Dinner about 7 PM  Wt Readings from Last 3 Encounters:  09/29/15 232 lb 12.8 oz (105.597 kg)  09/11/15 241 lb 6.4 oz (109.498 kg)  06/18/15 240 lb (108.863 kg)   LABS: Lab Results  Component Value Date   HGBA1C 7.3* 09/24/2015   HGBA1C 7.2* 05/06/2015   HGBA1C 7.2* 01/13/2015   Lab Results  Component Value Date   MICROALBUR 4.5* 10/14/2014   LDLCALC 100* 01/13/2015   CREATININE 1.04 09/24/2015     Appointment on 09/24/2015  Component Date Value Ref Range Status  . Hgb A1c MFr Bld 09/24/2015 7.3* 4.6 - 6.5 % Final   Glycemic Control Guidelines for People with Diabetes:Non Diabetic:  <6%Goal of Therapy: <7%Additional Action Suggested:  >8%   . Sodium 09/24/2015 136  135 - 145 mEq/L Final  . Potassium 09/24/2015 4.2  3.5 - 5.1 mEq/L Final  . Chloride 09/24/2015 102  96 - 112 mEq/L Final  . CO2 09/24/2015 26  19 - 32 mEq/L Final  . Glucose, Bld 09/24/2015 113* 70 - 99 mg/dL Final  . BUN 09/24/2015 24* 6 - 23 mg/dL Final  . Creatinine, Ser 09/24/2015 1.04  0.40 - 1.50 mg/dL Final  .  Total Bilirubin 09/24/2015 0.5  0.2 - 1.2 mg/dL Final  . Alkaline Phosphatase 09/24/2015 54  39 - 117 U/L Final  . AST 09/24/2015 15  0 - 37 U/L Final  . ALT 09/24/2015 13  0 - 53 U/L Final  . Total Protein 09/24/2015 6.8  6.0 - 8.3 g/dL Final  . Albumin 09/24/2015 4.1  3.5 - 5.2 g/dL Final  . Calcium 09/24/2015 9.5  8.4 - 10.5 mg/dL Final  . GFR 09/24/2015 73.13  >60.00 mL/min Final  . WBC 09/24/2015 7.2  4.0 - 10.5 K/uL Final  . RBC 09/24/2015 4.23  4.22 - 5.81 Mil/uL Final  . Platelets 09/24/2015 186.0  150.0 - 400.0 K/uL Final  . Hemoglobin 09/24/2015 12.6* 13.0 - 17.0 g/dL Final  . HCT 09/24/2015 38.6* 39.0 - 52.0 % Final  . MCV 09/24/2015 91.3  78.0 - 100.0 fl Final  . MCHC 09/24/2015 32.5  30.0 - 36.0 g/dL Final  . RDW 09/24/2015 14.3  11.5 - 15.5 % Final      Medication List       This list is accurate as of: 09/29/15 12:08  PM.  Always use your most recent med list.               ACCU-CHEK AVIVA PLUS test strip  Generic drug:  glucose blood  USE AS INSTRUCTED TO CHECK BLOOD SUGAR TWICE A DAY (CODE 250.00)     amLODipine 10 MG tablet  Commonly known as:  NORVASC  Take 10 mg by mouth daily.     aspirin 81 MG tablet  Take 81 mg by mouth daily.     ferrous sulfate 325 (65 FE) MG tablet  Take 325 mg by mouth daily with breakfast.     fish oil-omega-3 fatty acids 1000 MG capsule  Take 2 g by mouth daily.     glimepiride 2 MG tablet  Commonly known as:  AMARYL  Take 1 mg by mouth daily.     hydrochlorothiazide 12.5 MG tablet  Commonly known as:  HYDRODIURIL  Take 1 tablet (12.5 mg total) by mouth daily.     ibuprofen 200 MG tablet  Commonly known as:  ADVIL,MOTRIN  Take 200 mg by mouth every 6 (six) hours as needed for headache or moderate pain.     insulin NPH Human 100 UNIT/ML injection  Commonly known as:  HUMULIN N,NOVOLIN N  Inject 0.22 mLs (22 Units total) into the skin at bedtime. Uses Vial     Insulin Pen Needle 31G X 5 MM Misc  Commonly known as:  B-D UF III MINI PEN NEEDLES  Use 1 pen needle per day     Insulin Syringe-Needle U-100 31G X 5/16" 1 ML Misc  Commonly known as:  BD INSULIN SYRINGE ULTRAFINE  Use to inject insulin daily     Liraglutide 18 MG/3ML Sopn  Commonly known as:  VICTOZA  Inject 0.3 mLs (1.8 mg total) into the skin daily.     metFORMIN 1000 MG tablet  Commonly known as:  GLUCOPHAGE  Take 1 tablet (1,000 mg total) by mouth 2 (two) times daily with a meal.     metoprolol succinate 25 MG 24 hr tablet  Commonly known as:  TOPROL-XL  Take 1 tablet (25 mg total) by mouth daily. Take with or immediately following a meal.     multivitamin with minerals Tabs tablet  Take 1 tablet by mouth daily.     olmesartan-hydrochlorothiazide 40-25 MG tablet  Commonly known as:  BENICAR HCT  Take 1 tablet by mouth daily.     pravastatin 40 MG tablet  Commonly known as:   PRAVACHOL  Take 1 tablet (40 mg total) by mouth daily.     PRESERVISION/LUTEIN Caps  Take by mouth daily.     tamsulosin 0.4 MG Caps capsule  Commonly known as:  FLOMAX  Take 1 capsule (0.4 mg total) by mouth daily.        Allergies:  Allergies  Allergen Reactions  . Morphine And Related Nausea And Vomiting    Past Medical History  Diagnosis Date  . Unspecified essential hypertension   . Pure hypercholesterolemia   . Type II or unspecified type diabetes mellitus without mention of complication, not stated as uncontrolled   . RBBB   . CAD (coronary artery disease)   . Diabetes ([place])   . Hyperlipidemia   . Obesity (BMI 30-39.9) 06/18/2015    Past Surgical History  Procedure Laterality Date  . Vein surgery    . Anterior cruciate ligament repair    . Carpal tunnel release      Family History  Problem Relation Age of Onset  . Heart failure Father   . Emphysema Father   . Hypertension Mother   . Cancer Sister   . Healthy Sister     Social History:  reports that he has never smoked. He has never used smokeless tobacco. His alcohol and drug histories are not on file.  Review of Systems:  HYPERTENSION:  Blood pressure is stable He has not checked blood pressure regularly at home  His cardiologist increased the HCTZ to 25 mg earlier this month as blood pressure was mildly increased   HYPERLIPIDEMIA: The lipid abnormality consists of elevated LDL and he is taking 40 mg Pravachol, no side effects.  Lab Results  Component Value Date   CHOL 184 01/13/2015   HDL 58.50 01/13/2015   LDLCALC 100* 01/13/2015   TRIG 129.0 01/13/2015   CHOLHDL 3 01/13/2015   History of daytime fatigability and somnolence if sitting still.   He had an abnormal sleep study; having difficulty with his current CPAP treatment and is scheduling follow-up   He complains of shortness of breath on exertion, this occurs with walking or doing at work.  No evidence of CHF  History of  overactive bladder for which he takes Enablex only when he is traveling  Mild anemia: He is taking iron supplements 3 times a week and has had stable hemoglobin levels near normal  No history of B-12 deficiency   Lab Results  Component Value Date   WBC 7.2 09/24/2015   HGB 12.6* 09/24/2015   HCT 38.6* 09/24/2015   MCV 91.3 09/24/2015   PLT 186.0 09/24/2015   Foot exam last done in 9/16   Examination:   BP 124/68 mmHg  Pulse 60  Temp(Src) 98.5 F (36.9 C)  Resp 16  Ht 5\' 9"  (1.753 m)  Wt 232 lb 12.8 oz (105.597 kg)  BMI 34.36 kg/m2  SpO2 97%  Body mass index is 34.36 kg/(m^2).   Standing blood pressure 130/70 left arm  No pedal edema present     ASSESSMENT/ PLAN:   Diabetes type 2 with obesity  See history of present illness for detailed discussion of his current management, blood sugar patterns and problems identified  The patient's diabetes control appears to be overall fair considering his age and duration of diabetes. Some of his high readings are related to inconsistency and diet at lunch or  supper despite taking Victoza 1.8 mg He is trying to do a little exercise and weight is somewhat better However not able to exercise much because of his dyspnea  For now will continue his regimen unchanged except reduce bedtime dose of NPH to 20 units to avoid overnight hypoglycemia He can check fasting readings less often as they are stable  DYSPNEA on exertion and sleep apnea, followed by pulmonologist   HYPERTENSION: Blood pressure is  better now with increasing HCTZ, electrolytes normal  Ladarrell Cornwall 09/29/2015, 12:08 PM

## 2015-09-29 NOTE — Telephone Encounter (Signed)
I have spoken to Todd Romero and she stated that they are having the patient repeat the study due to lack of data.

## 2015-09-30 ENCOUNTER — Telehealth: Payer: Self-pay | Admitting: *Deleted

## 2015-09-30 DIAGNOSIS — G4733 Obstructive sleep apnea (adult) (pediatric): Secondary | ICD-10-CM

## 2015-09-30 NOTE — Telephone Encounter (Signed)
-----   Message from Sueanne Margarita, MD sent at 09/29/2015  7:21 PM EST ----- Set CPAP at 11cm H2O and recheck D/L in 2 weeks

## 2015-09-30 NOTE — Telephone Encounter (Signed)
Patient's wife has been informed of the change. She is very [employer] attorney.  I let her know that I will pull a download and check on them in about two weeks.

## 2015-10-10 ENCOUNTER — Telehealth: Payer: Self-pay | Admitting: *Deleted

## 2015-10-10 NOTE — Telephone Encounter (Signed)
Called patient to let him know that I got the pressure changed in his machine.  He stated that he slept much better last night and could tell a huge difference.  I told him that I would pull a download from his machine to make sure that his AHI is improving on this pressure. Let him know to call me if he has any issues.  He was appreciative for the change and the call.

## 2015-11-07 ENCOUNTER — Telehealth: Payer: Self-pay | Admitting: Endocrinology

## 2015-11-07 ENCOUNTER — Other Ambulatory Visit: Payer: Self-pay | Admitting: Interventional Cardiology

## 2015-11-07 ENCOUNTER — Other Ambulatory Visit: Payer: Self-pay | Admitting: *Deleted

## 2015-11-07 MED ORDER — "INSULIN SYRINGE-NEEDLE U-100 31G X 5/16"" 1 ML MISC": Status: DC

## 2015-11-07 MED ORDER — AMLODIPINE BESYLATE 10 MG PO TABS: 10.0000 mg | ORAL_TABLET | Freq: Every day | ORAL | Status: DC

## 2015-11-07 MED ORDER — GLIMEPIRIDE 2 MG PO TABS: 1.0000 mg | ORAL_TABLET | Freq: Every day | ORAL | Status: DC

## 2015-11-07 NOTE — Telephone Encounter (Signed)
[facility]

## 2015-11-07 NOTE — Telephone Encounter (Signed)
[facility]lodipine

## 2015-12-10 ENCOUNTER — Telehealth: Payer: Self-pay | Admitting: Endocrinology

## 2015-12-10 ENCOUNTER — Other Ambulatory Visit: Payer: Self-pay | Admitting: *Deleted

## 2015-12-10 MED ORDER — INSULIN NPH (HUMAN) (ISOPHANE) 100 UNIT/ML ~~LOC~~ SUSP: 22.0000 [IU] | Freq: Every day | SUBCUTANEOUS | Status: DC

## 2015-12-10 MED ORDER — METFORMIN HCL 1000 MG PO TABS: 1000.0000 mg | ORAL_TABLET | Freq: Two times a day (BID) | ORAL | Status: DC

## 2015-12-10 NOTE — Telephone Encounter (Signed)
rx sent

## 2015-12-10 NOTE — Telephone Encounter (Signed)
Patient need refill of Humulin and metformin send to  [place], [place] [phone] (Phone) [phone] (Fax)

## 2015-12-10 NOTE — Telephone Encounter (Signed)
ok 

## 2015-12-18 ENCOUNTER — Encounter: Payer: Self-pay | Admitting: Interventional Cardiology

## 2015-12-18 ENCOUNTER — Ambulatory Visit (INDEPENDENT_AMBULATORY_CARE_PROVIDER_SITE_OTHER): Payer: Medicare Other | Admitting: Interventional Cardiology

## 2015-12-18 VITALS — BP 142/66 | HR 64 | Ht 69.0 in | Wt 240.4 lb

## 2015-12-18 DIAGNOSIS — I2510 Atherosclerotic heart disease of native coronary artery without angina pectoris: Secondary | ICD-10-CM

## 2015-12-18 DIAGNOSIS — I5032 Chronic diastolic (congestive) heart failure: Secondary | ICD-10-CM

## 2015-12-18 DIAGNOSIS — I10 Essential (primary) hypertension: Secondary | ICD-10-CM | POA: Diagnosis not present

## 2015-12-18 DIAGNOSIS — R0600 Dyspnea, unspecified: Secondary | ICD-10-CM

## 2015-12-18 DIAGNOSIS — I5033 Acute on chronic diastolic (congestive) heart failure: Secondary | ICD-10-CM | POA: Insufficient documentation

## 2015-12-18 DIAGNOSIS — I4510 Unspecified right bundle-branch block: Secondary | ICD-10-CM

## 2015-12-18 DIAGNOSIS — G4733 Obstructive sleep apnea (adult) (pediatric): Secondary | ICD-10-CM

## 2015-12-18 DIAGNOSIS — I350 Nonrheumatic aortic (valve) stenosis: Secondary | ICD-10-CM

## 2015-12-18 DIAGNOSIS — E785 Hyperlipidemia, unspecified: Secondary | ICD-10-CM

## 2015-12-18 LAB — BASIC METABOLIC PANEL
BUN: 32 mg/dL — ABNORMAL HIGH (ref 7–25)
CHLORIDE: 102 mmol/L (ref 98–110)
CO2: 20 mmol/L (ref 20–31)
CREATININE: 1.1 mg/dL (ref 0.70–1.18)
Calcium: 9.6 mg/dL (ref 8.6–10.3)
Glucose, Bld: 113 mg/dL — ABNORMAL HIGH (ref 65–99)
POTASSIUM: 4.8 mmol/L (ref 3.5–5.3)
Sodium: 137 mmol/L (ref 135–146)

## 2015-12-18 LAB — BRAIN NATRIURETIC PEPTIDE: Brain Natriuretic Peptide: 75.7 pg/mL (ref <100)

## 2015-12-18 MED ORDER — OLMESARTAN MEDOXOMIL 40 MG PO TABS: 40.0000 mg | ORAL_TABLET | Freq: Every day | ORAL | Status: DC

## 2015-12-18 MED ORDER — FUROSEMIDE 40 MG PO TABS: 40.0000 mg | ORAL_TABLET | Freq: Two times a day (BID) | ORAL | Status: DC

## 2015-12-18 NOTE — Patient Instructions (Addendum)
Medication Instructions:  Your physician has recommended you make the following change in your medication:  1) STOP Benicar HCT 2) RESUME Benicar 40mg  daily. An Rx has been sent to your pharmacy 3) START Lasix 40mg  twice daily. An Rx has been sent to your pharmacy   Labwork: Bnp today  Your physician recommends that you return for lab work in: 1 week (Bmet)   Testing/Procedures: Your physician has requested that you have an echocardiogram. Echocardiography is a painless test that uses sound waves to create images of your heart. It provides your doctor with information about the size and shape of your heart and how well your heart's chambers and valves are working. This procedure takes approximately one hour. There are no restrictions for this procedure.    Follow-Up: You have a follow up appointment scheduled on 01/19/16 @ 10:15am  Any Other Special Instructions Will Be Listed Below (If Applicable).     If you need a refill on your cardiac medications before your next appointment, please call your pharmacy.

## 2015-12-18 NOTE — Progress Notes (Signed)
Cardiology Office Note   Date:  12/18/2015   ID:  Janice Coffin Whisenant, DOB 12-13-1933, MRN [id number]  PCP:  [name], MD  Cardiologist:  Sinclair Grooms, MD   Chief Complaint  Patient presents with  . Coronary Artery Disease      History of Present Illness: Todd Romero is a 82 y.o. male who presents for Mild aortic stenosis, sleep apnea, hypertension, CAD, right bundle branch block, and diabetes.  Three-month history of progressive lower extremity swelling, dyspnea on exertion, and fatigue. Denies chest pain. Has not had syncope. Depressed because he has limited exertional tolerance. There is a component of orthopnea.  Past Medical History  Diagnosis Date  . Unspecified essential hypertension   . Pure hypercholesterolemia   . Type II or unspecified type diabetes mellitus without mention of complication, not stated as uncontrolled   . RBBB   . CAD (coronary artery disease)   . Diabetes ([place])   . Hyperlipidemia   . Obesity (BMI 30-39.9) 06/18/2015    Past Surgical History  Procedure Laterality Date  . Vein surgery    . Anterior cruciate ligament repair    . Carpal tunnel release       Current Outpatient Prescriptions  Medication Sig Dispense Refill  . ACCU-CHEK AVIVA PLUS test strip USE AS INSTRUCTED TO CHECK BLOOD SUGAR TWICE A DAY (CODE 250.00) 200 each 3  . amLODipine (NORVASC) 10 MG tablet Take 1 tablet (10 mg total) by mouth daily. 90 tablet 1  . aspirin 81 MG tablet Take 81 mg by mouth daily.    . ferrous sulfate 325 (65 FE) MG tablet Take 325 mg by mouth daily with breakfast.    . fish oil-omega-3 fatty acids 1000 MG capsule Take 2 g by mouth daily.    [name] glimepiride (AMARYL) 2 MG tablet Take 0.5 tablets (1 mg total) by mouth daily. 45 tablet 1  . hydrochlorothiazide (HYDRODIURIL) 12.5 MG tablet Take 1 tablet (12.5 mg total) by mouth daily. 30 tablet 0  . ibuprofen (ADVIL,MOTRIN) 200 MG tablet Take 200 mg by mouth every 6 (six) hours as needed for  headache or moderate pain.     [name] insulin NPH Human (HUMULIN N,NOVOLIN N) 100 UNIT/ML injection Inject 0.22 mLs (22 Units total) into the skin at bedtime. Uses Vial 20 mL 1  . Insulin Pen Needle (B-D UF III MINI PEN NEEDLES) 31G X 5 MM MISC Use 1 pen needle per day 100 each 2  . Insulin Syringe-Needle U-100 (BD INSULIN SYRINGE ULTRAFINE) 31G X 5/16" 1 ML MISC Use to inject insulin daily 100 each 5  . Liraglutide (VICTOZA) 18 MG/3ML SOPN Inject 0.3 mLs (1.8 mg total) into the skin daily. 9 pen 1  . metFORMIN (GLUCOPHAGE) 1000 MG tablet Take 1 tablet (1,000 mg total) by mouth 2 (two) times daily with a meal. 180 tablet 1  . metoprolol succinate (TOPROL-XL) 25 MG 24 hr tablet Take 12.5 mg by mouth daily.    . Multiple Vitamin (MULTIVITAMIN WITH MINERALS) TABS tablet Take 1 tablet by mouth daily.    . Multiple Vitamins-Minerals (PRESERVISION/LUTEIN) CAPS Take 1 capsule by mouth daily.     [name] olmesartan-hydrochlorothiazide (BENICAR HCT) 40-25 MG tablet Take 1 tablet by mouth daily. 90 tablet 3  . pravastatin (PRAVACHOL) 40 MG tablet Take 1 tablet (40 mg total) by mouth daily. 90 tablet 1  . tamsulosin (FLOMAX) 0.4 MG CAPS capsule Take 1 capsule (0.4 mg total) by mouth daily. 90 capsule 1  No current facility-administered medications for this visit.    Allergies:   Morphine and related    Social History:  The patient  reports that he has never smoked. He has never used smokeless tobacco.   Family History:  The patient's family history includes Cancer in his sister; Emphysema in his father; Healthy in his sister; Heart failure in his father; Hypertension in his mother.    ROS:  Please see the history of present illness.   Otherwise, review of systems are positive for Depression, inability to tolerate CPAP mask, lower extremity swelling.   All other systems are reviewed and negative.    PHYSICAL EXAM: VS:  BP 142/66 mmHg  Pulse 64  Ht 5\' 9"  (1.753 m)  Wt 240 lb 6.4 oz (109.045 kg)  BMI 35.48  kg/m2 , BMI Body mass index is 35.48 kg/(m^2). GEN: Well nourished, well developed, in no acute distress HEENT: normal Neck: no JVD, carotid bruits, or masses Cardiac: RRR.  There is no murmur, rub, or gallop. There is 3+ ankle to knee  Edema, bilaterally Respiratory:  clear to auscultation bilaterally, normal work of breathing. GI: soft, nontender, nondistended, + BS MS: no deformity or atrophy Skin: warm and dry, no rash Neuro:  Strength and sensation are intact Psych: euthymic mood, full affect   EKG:  EKG is not ordered today.    Recent Labs: 01/13/2015: TSH 0.81 09/24/2015: ALT 13; Hemoglobin 12.6*; Platelets 186.0 09/29/2015: BUN 31*; Creat 1.01; Potassium 4.3; Sodium 136    Lipid Panel    Component Value Date/Time   CHOL 184 01/13/2015 0853   TRIG 129.0 01/13/2015 0853   HDL 58.50 01/13/2015 0853   CHOLHDL 3 01/13/2015 0853   VLDL 25.8 01/13/2015 0853   LDLCALC 100* 01/13/2015 0853      Wt Readings from Last 3 Encounters:  12/18/15 240 lb 6.4 oz (109.045 kg)  09/29/15 232 lb 12.8 oz (105.597 kg)  09/11/15 241 lb 6.4 oz (109.498 kg)      Other studies Reviewed: Additional studies/ records that were reviewed today include: Reviewed prior echo, nuclear study, and cath report. The findings include coronary angiogram demonstrated mild coronary disease in 2014..    ASSESSMENT AND PLAN:  1. Essential hypertension, benign Controlled  2. Coronary artery disease involving native coronary artery of native heart without angina pectoris Symptoms do not suggest progression of coronary disease  3. RBBB Not evaluated  4. Hyperlipidemia Not evaluated  5. OSA (obstructive sleep apnea) Have an terrible difficulty tolerating the C-PAP mask. Wears of between 2 and 4 hours each night.  6. Dyspnea Not associated with pulmonary congestion on clinical exam. Significant lower extremity edema may be related to amlodipine. I suspect at least a component of acute on chronic  diastolic heart failure. Perhaps aortic stenosis is worsening.  Current medicines are reviewed at length with the patient today.  The patient has the following concerns regarding medicines: The wife has decreased his beta blocker dose thank you that it is causing him to have dyspnea and fatigue..  The following changes/actions have been instituted:    Discontinue Benicar HCT  Benicar 40 mg daily  Furosemide 40 mg twice a day  Bmet, BNP today. Basic metabolic panel 1 week after starting the new treatment regimen  2-D Doppler echocardiogram to reassess aortic valve and LV function. Rule out significant pulmonary hypertension  Labs/ tests ordered today include:  No orders of the defined types were placed in this encounter.     Disposition:  FU with HS in 1 month  Signed, Sinclair Grooms, MD  12/18/2015 9:54 AM    [address] [place], [place], [place]  [address] Phone: [phone]; Fax: [phone]

## 2015-12-23 ENCOUNTER — Telehealth: Payer: Self-pay | Admitting: Endocrinology

## 2015-12-23 NOTE — Telephone Encounter (Signed)
Patient is returning your call, she stated she really need to talk to you.

## 2015-12-23 NOTE — Telephone Encounter (Signed)
Pt wife requests call back from you about Pt's Victoza, she said it is very important. [phone]

## 2015-12-23 NOTE — Telephone Encounter (Signed)
Message left for his wife to return call.

## 2015-12-29 ENCOUNTER — Telehealth: Payer: Self-pay | Admitting: Interventional Cardiology

## 2015-12-29 MED ORDER — FUROSEMIDE 40 MG PO TABS: 40.0000 mg | ORAL_TABLET | Freq: Every day | ORAL | Status: DC

## 2015-12-29 NOTE — Telephone Encounter (Signed)
-----   Message from Belva Crome, MD sent at 12/20/2015 12:29 PM EDT ----- Blood tests suggest that LE edema is related to amlodipine. He should stop that medication. Use furosemide 40 mg once daily not twice daily. Needs f/u OV to see how he is doing 2 weeks after stopping amlodipine.

## 2015-12-29 NOTE — Telephone Encounter (Signed)
New message      Returning a nurses call from last week

## 2015-12-29 NOTE — Telephone Encounter (Signed)
Pt aware of lab results and Dr.Smith's recommendation. Blood tests suggest that LE edema is related to amlodipine. He should stop that medication. Use furosemide 40 mg once daily not twice daily. Needs f/u OV to see how he is doing 2 weeks after stopping amlodipine.  Pt will f/u as planned in May with Dr.Smith. Pt voiced appreciation for the call and verbalized understanding

## 2016-01-01 ENCOUNTER — Other Ambulatory Visit: Payer: Self-pay | Admitting: *Deleted

## 2016-01-01 ENCOUNTER — Telehealth: Payer: Self-pay | Admitting: Endocrinology

## 2016-01-01 MED ORDER — PRAVASTATIN SODIUM 40 MG PO TABS: 40.0000 mg | ORAL_TABLET | Freq: Every day | ORAL | Status: DC

## 2016-01-01 MED ORDER — PRAVASTATIN SODIUM 40 MG PO TABS
40.0000 mg | ORAL_TABLET | Freq: Every day | ORAL | Status: DC
Start: 2016-01-01 — End: 2016-01-28

## 2016-01-01 NOTE — Telephone Encounter (Signed)
[facility]

## 2016-01-01 NOTE — Telephone Encounter (Signed)
Patient need a refill of medication pravastatin (PRAVACHOL) 40 MG tablet, send to  [place], [place] [phone] (Phone) [phone] (Fax)

## 2016-01-05 ENCOUNTER — Other Ambulatory Visit (INDEPENDENT_AMBULATORY_CARE_PROVIDER_SITE_OTHER): Payer: Medicare Other

## 2016-01-05 ENCOUNTER — Encounter: Payer: Self-pay | Admitting: Cardiology

## 2016-01-05 ENCOUNTER — Ambulatory Visit (INDEPENDENT_AMBULATORY_CARE_PROVIDER_SITE_OTHER): Payer: Medicare Other | Admitting: Cardiology

## 2016-01-05 ENCOUNTER — Ambulatory Visit (HOSPITAL_COMMUNITY): Payer: Medicare Other | Attending: Cardiovascular Disease

## 2016-01-05 ENCOUNTER — Other Ambulatory Visit: Payer: Self-pay

## 2016-01-05 VITALS — BP 124/70 | HR 68 | Ht 69.0 in | Wt 240.8 lb

## 2016-01-05 DIAGNOSIS — I059 Rheumatic mitral valve disease, unspecified: Secondary | ICD-10-CM | POA: Insufficient documentation

## 2016-01-05 DIAGNOSIS — Z8249 Family history of ischemic heart disease and other diseases of the circulatory system: Secondary | ICD-10-CM | POA: Diagnosis not present

## 2016-01-05 DIAGNOSIS — I2510 Atherosclerotic heart disease of native coronary artery without angina pectoris: Secondary | ICD-10-CM | POA: Diagnosis not present

## 2016-01-05 DIAGNOSIS — I10 Essential (primary) hypertension: Secondary | ICD-10-CM

## 2016-01-05 DIAGNOSIS — I5032 Chronic diastolic (congestive) heart failure: Secondary | ICD-10-CM | POA: Insufficient documentation

## 2016-01-05 DIAGNOSIS — E669 Obesity, unspecified: Secondary | ICD-10-CM

## 2016-01-05 DIAGNOSIS — E119 Type 2 diabetes mellitus without complications: Secondary | ICD-10-CM | POA: Insufficient documentation

## 2016-01-05 DIAGNOSIS — I350 Nonrheumatic aortic (valve) stenosis: Secondary | ICD-10-CM | POA: Insufficient documentation

## 2016-01-05 DIAGNOSIS — I4510 Unspecified right bundle-branch block: Secondary | ICD-10-CM | POA: Insufficient documentation

## 2016-01-05 DIAGNOSIS — E785 Hyperlipidemia, unspecified: Secondary | ICD-10-CM | POA: Insufficient documentation

## 2016-01-05 DIAGNOSIS — G4733 Obstructive sleep apnea (adult) (pediatric): Secondary | ICD-10-CM

## 2016-01-05 DIAGNOSIS — I110 Hypertensive heart disease with heart failure: Secondary | ICD-10-CM | POA: Insufficient documentation

## 2016-01-05 DIAGNOSIS — Z6835 Body mass index (BMI) 35.0-35.9, adult: Secondary | ICD-10-CM | POA: Insufficient documentation

## 2016-01-05 LAB — BASIC METABOLIC PANEL
BUN: 25 mg/dL (ref 7–25)
CALCIUM: 9.3 mg/dL (ref 8.6–10.3)
CHLORIDE: 103 mmol/L (ref 98–110)
CO2: 23 mmol/L (ref 20–31)
CREATININE: 1.02 mg/dL (ref 0.70–1.18)
Glucose, Bld: 119 mg/dL — ABNORMAL HIGH (ref 65–99)
Potassium: 4.4 mmol/L (ref 3.5–5.3)
Sodium: 134 mmol/L — ABNORMAL LOW (ref 135–146)

## 2016-01-05 MED ORDER — SALINE SPRAY 0.65 % NA SOLN: 2.0000 | Freq: Two times a day (BID) | NASAL | Status: DC

## 2016-01-05 NOTE — Addendum Note (Signed)
Addended by: Harland German A on: 01/05/2016 12:02 PM   Modules accepted: Orders

## 2016-01-05 NOTE — Patient Instructions (Addendum)
Medication Instructions:  1) START NASAL SALINE 2 squirts each nostril twice daily  Labwork: None  Testing/Procedures: [facility] will be in touch with you to change CPAP settings.  Follow-Up: You have an appointment scheduled 03/17/16 at 1045 with Dr. Radford Pax.  Any Other Special Instructions Will Be Listed Below (If Applicable).     If you need a refill on your cardiac medications before your next appointment, please call your pharmacy.

## 2016-01-05 NOTE — Progress Notes (Signed)
Cardiology Office Note    Date:  01/05/2016   ID:  Todd Romero, DOB 1933/09/29, MRN [id number]  PCP:  [name], MD  Cardiologist:  Sueanne Margarita, MD   Chief Complaint  Patient presents with  . Sleep Apnea  . Hypertension    History of Present Illness:  Todd Romero is a 82 y.o. male who presents for followup of OSA. He was referred by Dr. Tamala Julian for evaluation of snoring and excessive daytime sleepiness. His Epworth Sleepiness score was 9. He was found to have severe OSA with an AHI of 27/hr with most events occurring in REM sleep in the non supine position. Oxygen saturations dropped to 79%. He underwent CPAP titration to 9cm H2O. He is not doing well with his CPAP and is now on 11cm H2O.  He says that his throat gets so dry that he has to get up several time to drink water.  He uses the humidifier. He is having problems with his nose getting congestion.  He tolerates the full face mask. He says that he feels the pressure is adequate.   His wife says that he is not snoring     Past Medical History  Diagnosis Date  . Unspecified essential hypertension   . Pure hypercholesterolemia   . Type II or unspecified type diabetes mellitus without mention of complication, not stated as uncontrolled   . RBBB   . CAD (coronary artery disease)   . Diabetes ([place])   . Hyperlipidemia   . Obesity (BMI 30-39.9) 06/18/2015  . OSA (obstructive sleep apnea)     Severe with AHI 27/hr now on CPAP    Past Surgical History  Procedure Laterality Date  . Vein surgery    . Anterior cruciate ligament repair    . Carpal tunnel release      Current Medications: Outpatient Prescriptions Prior to Visit  Medication Sig Dispense Refill  . ACCU-CHEK AVIVA PLUS test strip USE AS INSTRUCTED TO CHECK BLOOD SUGAR TWICE A DAY (CODE 250.00) 200 each 3  . aspirin 81 MG tablet Take 81 mg by mouth daily.    . ferrous sulfate 325 (65 FE) MG tablet Take 325 mg by mouth daily with breakfast.     . fish oil-omega-3 fatty acids 1000 MG capsule Take 2 g by mouth daily.    . furosemide (LASIX) 40 MG tablet Take 1 tablet (40 mg total) by mouth daily.    [name] glimepiride (AMARYL) 2 MG tablet Take 0.5 tablets (1 mg total) by mouth daily. 45 tablet 1  . ibuprofen (ADVIL,MOTRIN) 200 MG tablet Take 200 mg by mouth every 6 (six) hours as needed for headache or moderate pain.     [name] insulin NPH Human (HUMULIN N,NOVOLIN N) 100 UNIT/ML injection Inject 0.22 mLs (22 Units total) into the skin at bedtime. Uses Vial 20 mL 1  . Insulin Pen Needle (B-D UF III MINI PEN NEEDLES) 31G X 5 MM MISC Use 1 pen needle per day 100 each 2  . Insulin Syringe-Needle U-100 (BD INSULIN SYRINGE ULTRAFINE) 31G X 5/16" 1 ML MISC Use to inject insulin daily 100 each 5  . Liraglutide (VICTOZA) 18 MG/3ML SOPN Inject 0.3 mLs (1.8 mg total) into the skin daily. 9 pen 1  . metFORMIN (GLUCOPHAGE) 1000 MG tablet Take 1 tablet (1,000 mg total) by mouth 2 (two) times daily with a meal. 180 tablet 1  . metoprolol succinate (TOPROL-XL) 25 MG 24 hr tablet Take 12.5 mg by mouth daily.    [name]  Multiple Vitamin (MULTIVITAMIN WITH MINERALS) TABS tablet Take 1 tablet by mouth daily.    . Multiple Vitamins-Minerals (PRESERVISION/LUTEIN) CAPS Take 1 capsule by mouth daily.     [name] olmesartan (BENICAR) 40 MG tablet Take 1 tablet (40 mg total) by mouth daily. 30 tablet 1  . pravastatin (PRAVACHOL) 40 MG tablet Take 1 tablet (40 mg total) by mouth daily. 90 tablet 1  . tamsulosin (FLOMAX) 0.4 MG CAPS capsule Take 1 capsule (0.4 mg total) by mouth daily. 90 capsule 1  . hydrochlorothiazide (HYDRODIURIL) 12.5 MG tablet Take 1 tablet (12.5 mg total) by mouth daily. 30 tablet 0   No facility-administered medications prior to visit.     Allergies:   Morphine and related   Social History   Social History  . Marital Status: Married    Spouse Name: N/A  . Number of Children: N/A  . Years of Education: N/A   Social History Main Topics  . Smoking  status: Never Smoker   . Smokeless tobacco: Never Used  . Alcohol Use: None  . Drug Use: None  . Sexual Activity: Not Asked   Other Topics Concern  . None   Social History Narrative     Family History:  The patient's family history includes Cancer in his sister; Emphysema in his father; Healthy in his sister; Heart failure in his father; Hypertension in his mother.   ROS:   Please see the history of present illness.    ROS All other systems reviewed and are negative.   PHYSICAL EXAM:   VS:  BP 124/70 mmHg  Pulse 68  Ht 5\' 9"  (1.753 m)  Wt 240 lb 12.8 oz (109.226 kg)  BMI 35.54 kg/m2   GEN: Well nourished, well developed, in no acute distress HEENT: normal Neck: no JVD, carotid bruits, or masses Cardiac: RRR; no murmurs, rubs, or gallops,no edema.  Intact distal pulses bilaterally.  Respiratory:  clear to auscultation bilaterally, normal work of breathing GI: soft, nontender, nondistended, + BS MS: no deformity or atrophy Skin: warm and dry, no rash Neuro:  Alert and Oriented x 3, Strength and sensation are intact Psych: euthymic mood, full affect  Wt Readings from Last 3 Encounters:  01/05/16 240 lb 12.8 oz (109.226 kg)  12/18/15 240 lb 6.4 oz (109.045 kg)  09/29/15 232 lb 12.8 oz (105.597 kg)      Studies/Labs Reviewed:   EKG:  EKG is not ordered today.    Recent Labs: 01/13/2015: TSH 0.81 09/24/2015: ALT 13; Hemoglobin 12.6*; Platelets 186.0 12/18/2015: BUN 32*; Creat 1.10; Potassium 4.8; Sodium 137   Lipid Panel    Component Value Date/Time   CHOL 184 01/13/2015 0853   TRIG 129.0 01/13/2015 0853   HDL 58.50 01/13/2015 0853   CHOLHDL 3 01/13/2015 0853   VLDL 25.8 01/13/2015 0853   LDLCALC 100* 01/13/2015 0853    Additional studies/ records that were reviewed today include:  none    ASSESSMENT:    1. Obstructive sleep apnea   2. Obesity (BMI 30-39.9)   3. Essential hypertension, benign      PLAN:  In order of problems listed  above:  1. Severe OSA on CPAP which he has not been tolerating due to nasal congestion, mouth dryness and ongoing apneas as noted by an AHI of 10.3/hr on his download.  I have recommended that he try nasal saline spray twice daily.  I have also recommended that we increase CPAP to 13cm H2O and I will get a download in  2 weeks.   2. Obesity - I have encouraged him to get into a routine exercise program and cut back on carbs and portions.  3. HTN - BP controlled on current medical therapy.  Continue Toprol/ARB    Medication Adjustments/Labs and Tests Ordered: Current medicines are reviewed at length with the patient today.  Concerns regarding medicines are outlined above.  Medication changes, Labs and Tests ordered today are listed in the Patient Instructions below.   Lurena Nida, MD  01/05/2016 11:38 AM    [facility] [address], [place], [place]  [address] Phone: [phone]; Fax: [phone]

## 2016-01-09 ENCOUNTER — Other Ambulatory Visit: Payer: Self-pay | Admitting: *Deleted

## 2016-01-09 MED ORDER — OLMESARTAN MEDOXOMIL 40 MG PO TABS: 40.0000 mg | ORAL_TABLET | Freq: Every day | ORAL | Status: DC

## 2016-01-09 MED ORDER — FUROSEMIDE 40 MG PO TABS: 40.0000 mg | ORAL_TABLET | Freq: Every day | ORAL | Status: DC

## 2016-01-13 ENCOUNTER — Encounter: Payer: Self-pay | Admitting: Cardiology

## 2016-01-19 ENCOUNTER — Encounter: Payer: Self-pay | Admitting: Interventional Cardiology

## 2016-01-19 ENCOUNTER — Ambulatory Visit (INDEPENDENT_AMBULATORY_CARE_PROVIDER_SITE_OTHER): Payer: Medicare Other | Admitting: Interventional Cardiology

## 2016-01-19 VITALS — BP 160/78 | HR 63 | Ht 69.0 in | Wt 237.2 lb

## 2016-01-19 DIAGNOSIS — I2510 Atherosclerotic heart disease of native coronary artery without angina pectoris: Secondary | ICD-10-CM | POA: Diagnosis not present

## 2016-01-19 DIAGNOSIS — I10 Essential (primary) hypertension: Secondary | ICD-10-CM

## 2016-01-19 DIAGNOSIS — I5032 Chronic diastolic (congestive) heart failure: Secondary | ICD-10-CM

## 2016-01-19 DIAGNOSIS — I350 Nonrheumatic aortic (valve) stenosis: Secondary | ICD-10-CM

## 2016-01-19 DIAGNOSIS — R0600 Dyspnea, unspecified: Secondary | ICD-10-CM | POA: Diagnosis not present

## 2016-01-19 DIAGNOSIS — G4733 Obstructive sleep apnea (adult) (pediatric): Secondary | ICD-10-CM

## 2016-01-19 MED ORDER — FUROSEMIDE 80 MG PO TABS: 80.0000 mg | ORAL_TABLET | Freq: Every day | ORAL | Status: DC

## 2016-01-19 NOTE — Patient Instructions (Addendum)
Medication Instructions:  Your physician has recommended you make the following change in your medication:  INCREASE Lasix to 80mg  daily   Labwork: Your physician recommends that you return for lab work on 01/26/16 ([facility])   Testing/Procedures: None ordered  Follow-Up: You have a follow up appoint ment scheduled on 02/12/16 @ 9:45am  Any Other Special Instructions Will Be Listed Below (If Applicable). Your physician recommends that you weigh, daily, at the same time every day, and in the same amount of clothing. Please record your daily weights. Call the office if your weight is down 6lbs or more        If you need a refill on your cardiac medications before your next appointment, please call your pharmacy.

## 2016-01-19 NOTE — Progress Notes (Signed)
Cardiology Office Note    Date:  01/19/2016   ID:  Todd Romero, DOB 1934/03/05, MRN [id number]  PCP:  [name], MD  Cardiologist: Sinclair Grooms, MD   Chief Complaint  Patient presents with  . Congestive Heart Failure    History of Present Illness:  Todd Romero is a 82 y.o. male follow-up of CAD, hyperlipidemia, right bundle branch block, obstructive sleep apnea, and chronic diastolic heart failure.  On the last office visit, as a management strategy for dyspnea we have the patient follow-up with a sleep specialist. I transiently increased his diuretic regimen but there was deterioration in kidney function. He is down 3 pounds. He feels that furosemide causes him to urinate frequently but despite some weight loss has not noted improvement in dyspnea. He denies orthopnea. His concern is predominantly dyspnea on exertion. His working diagnoses include obstructive sleep apnea, moderate aortic stenosis, chronic diastolic heart failure related to coronary artery disease and hypertension.  Past Medical History  Diagnosis Date  . Unspecified essential hypertension   . Pure hypercholesterolemia   . Type II or unspecified type diabetes mellitus without mention of complication, not stated as uncontrolled   . RBBB   . CAD (coronary artery disease)   . Diabetes ([place])   . Hyperlipidemia   . Obesity (BMI 30-39.9) 06/18/2015  . OSA (obstructive sleep apnea)     Severe with AHI 27/hr now on CPAP    Past Surgical History  Procedure Laterality Date  . Vein surgery    . Anterior cruciate ligament repair    . Carpal tunnel release      Current Medications: Outpatient Prescriptions Prior to Visit  Medication Sig Dispense Refill  . ACCU-CHEK AVIVA PLUS test strip USE AS INSTRUCTED TO CHECK BLOOD SUGAR TWICE A DAY (CODE 250.00) 200 each 3  . aspirin 81 MG tablet Take 81 mg by mouth daily.    . ferrous sulfate 325 (65 FE) MG tablet Take 325 mg by mouth daily with  breakfast.    . fish oil-omega-3 fatty acids 1000 MG capsule Take 2 g by mouth daily.    [name] glimepiride (AMARYL) 2 MG tablet Take 0.5 tablets (1 mg total) by mouth daily. 45 tablet 1  . ibuprofen (ADVIL,MOTRIN) 200 MG tablet Take 200 mg by mouth every 6 (six) hours as needed for headache or moderate pain.     [name] insulin NPH Human (HUMULIN N,NOVOLIN N) 100 UNIT/ML injection Inject 0.22 mLs (22 Units total) into the skin at bedtime. Uses Vial 20 mL 1  . Insulin Pen Needle (B-D UF III MINI PEN NEEDLES) 31G X 5 MM MISC Use 1 pen needle per day 100 each 2  . Insulin Syringe-Needle U-100 (BD INSULIN SYRINGE ULTRAFINE) 31G X 5/16" 1 ML MISC Use to inject insulin daily 100 each 5  . Liraglutide (VICTOZA) 18 MG/3ML SOPN Inject 0.3 mLs (1.8 mg total) into the skin daily. 9 pen 1  . metFORMIN (GLUCOPHAGE) 1000 MG tablet Take 1 tablet (1,000 mg total) by mouth 2 (two) times daily with a meal. 180 tablet 1  . metoprolol succinate (TOPROL-XL) 25 MG 24 hr tablet Take 12.5 mg by mouth daily.    . Multiple Vitamin (MULTIVITAMIN WITH MINERALS) TABS tablet Take 1 tablet by mouth daily.    . Multiple Vitamins-Minerals (PRESERVISION/LUTEIN) CAPS Take 1 capsule by mouth daily.     [name] olmesartan (BENICAR) 40 MG tablet Take 1 tablet (40 mg total) by mouth daily. 90 tablet 3  .  pravastatin (PRAVACHOL) 40 MG tablet Take 1 tablet (40 mg total) by mouth daily. 90 tablet 1  . sodium chloride (OCEAN) 0.65 % SOLN nasal spray Place 2 sprays into both nostrils 2 (two) times daily.  0  . tamsulosin (FLOMAX) 0.4 MG CAPS capsule Take 1 capsule (0.4 mg total) by mouth daily. 90 capsule 1  . furosemide (LASIX) 40 MG tablet Take 1 tablet (40 mg total) by mouth daily. 90 tablet 3   No facility-administered medications prior to visit.     Allergies:   Morphine and related   Social History   Social History  . Marital Status: Married    Spouse Name: N/A  . Number of Children: N/A  . Years of Education: N/A   Social History Main  Topics  . Smoking status: Never Smoker   . Smokeless tobacco: Never Used  . Alcohol Use: None  . Drug Use: None  . Sexual Activity: Not Asked   Other Topics Concern  . None   Social History Narrative     Family History:  The patient's family history includes Cancer in his sister; Emphysema in his father; Healthy in his sister; Heart failure in his father; Hypertension in his mother.   ROS:   Please see the history of present illness.    States the dyspnea is so severe that it is not worth living. He feels very limited All other systems reviewed and are negative.   PHYSICAL EXAM:   VS:  BP 160/78 mmHg  Pulse 63  Ht 5\' 9"  (1.753 m)  Wt 237 lb 3.2 oz (107.593 kg)  BMI 35.01 kg/m2   GEN: Well nourished, well developed, in no acute distress HEENT: normal Neck: no JVD, carotid bruits, or masses Cardiac: 2/6 systolic murmur in the right upper sternal border. There is RRR; no rubs, or gallops.  There is 3+ ankle edema right greater than left.  Respiratory:  clear to auscultation bilaterally, normal work of breathing GI: soft, nontender, nondistended, + BS MS: no deformity or atrophy Skin: warm and dry, no rash Neuro:  Alert and Oriented x 3, Strength and sensation are intact Psych: euthymic mood, full affect  Wt Readings from Last 3 Encounters:  01/19/16 237 lb 3.2 oz (107.593 kg)  01/05/16 240 lb 12.8 oz (109.226 kg)  12/18/15 240 lb 6.4 oz (109.045 kg)      Studies/Labs Reviewed:   EKG:  EKG  The EKG is not repeated.  Current Labs: 09/24/2015: ALT 13; Hemoglobin 12.6*; Platelets 186.0 01/05/2016: BUN 25; Creat 1.02; Potassium 4.4; Sodium 134*   Lipid Panel    Component Value Date/Time   CHOL 184 01/13/2015 0853   TRIG 129.0 01/13/2015 0853   HDL 58.50 01/13/2015 0853   CHOLHDL 3 01/13/2015 0853   VLDL 25.8 01/13/2015 0853   LDLCALC 100* 01/13/2015 0853    Additional studies/ records that were reviewed today include:  Echocardiogram performed after the last  visit demonstrated:  Study Conclusions  - Left ventricle: The cavity size was normal. Wall thickness was  increased in a pattern of mild LVH. Systolic function was normal.  The estimated ejection fraction was in the range of 55% to 60%.  Wall motion was normal; there were no regional wall motion  abnormalities. Doppler parameters are consistent with abnormal  left ventricular relaxation (grade 1 diastolic dysfunction). - Aortic valve: There was moderate stenosis. Valve area (VTI): 1.45  cm^2. Valve area (Vmax): 1.37 cm^2. Valve area (Vmean): 1.33  cm^2. - Mitral valve: Mildly  to moderately calcified annulus. - Left atrium: The atrium was mildly dilated. - Right atrium: The atrium was mildly dilated.  ASSESSMENT:    1. Dyspnea   2. Aortic stenosis   3. Chronic diastolic heart failure ([place])   4. Coronary artery disease involving native coronary artery of native heart without angina pectoris   5. Essential hypertension, benign   6. OSA (obstructive sleep apnea)      PLAN:  In order of problems listed above:  1.    I believe the dyspnea is multifactorial related to obesity, sleep apnea, diastolic heart failure, and physical deconditioning. Since there is significant edema I will further increase the diuretic regimen to remove any component of volume overload contributing to the dyspnea. I do this despite a relatively normal BNP of less than 100 within the past month. We will plan to perform a basic metabolic panel in one week.   2.   The recent echo demonstrated moderate aortic stenosis and preserved LV systolic function    Medication Adjustments/Labs and Tests Ordered: Current medicines are reviewed at length with the patient today.  Concerns regarding medicines are outlined above.  Medication changes, Labs and Tests ordered today are listed in the Patient Instructions below. Patient Instructions  Medication Instructions:  Your physician has recommended you make the  following change in your medication:  INCREASE Lasix to 80mg  daily   Labwork: Your physician recommends that you return for lab work on 01/26/16 ([facility])   Testing/Procedures: None ordered  Follow-Up: You have a follow up appoint ment scheduled on 02/12/16 @ 9:45am  Any Other Special Instructions Will Be Listed Below (If Applicable). Your physician recommends that you weigh, daily, at the same time every day, and in the same amount of clothing. Please record your daily weights. Call the office if your weight is down 6lbs or more        If you need a refill on your cardiac medications before your next appointment, please call your pharmacy.       Signed, Sinclair Grooms, MD  01/19/2016 11:33 AM    [facility] [address], [place], [place]  [address] Phone: [phone]; Fax: [phone]

## 2016-01-22 ENCOUNTER — Telehealth: Payer: Self-pay | Admitting: *Deleted

## 2016-01-22 ENCOUNTER — Other Ambulatory Visit (INDEPENDENT_AMBULATORY_CARE_PROVIDER_SITE_OTHER): Payer: Medicare Other

## 2016-01-22 DIAGNOSIS — E785 Hyperlipidemia, unspecified: Secondary | ICD-10-CM

## 2016-01-22 DIAGNOSIS — Z794 Long term (current) use of insulin: Secondary | ICD-10-CM

## 2016-01-22 DIAGNOSIS — E1165 Type 2 diabetes mellitus with hyperglycemia: Secondary | ICD-10-CM

## 2016-01-22 DIAGNOSIS — D509 Iron deficiency anemia, unspecified: Secondary | ICD-10-CM | POA: Diagnosis not present

## 2016-01-22 DIAGNOSIS — G4733 Obstructive sleep apnea (adult) (pediatric): Secondary | ICD-10-CM

## 2016-01-22 LAB — CBC
HCT: 36 % — ABNORMAL LOW (ref 39.0–52.0)
Hemoglobin: 11.9 g/dL — ABNORMAL LOW (ref 13.0–17.0)
MCHC: 33.1 g/dL (ref 30.0–36.0)
MCV: 90.2 fl (ref 78.0–100.0)
PLATELETS: 176 10*3/uL (ref 150.0–400.0)
RBC: 3.99 Mil/uL — ABNORMAL LOW (ref 4.22–5.81)
RDW: 14.4 % (ref 11.5–15.5)
WBC: 7.4 10*3/uL (ref 4.0–10.5)

## 2016-01-22 LAB — POCT URINALYSIS DIPSTICK
Bilirubin, UA: NEGATIVE
GLUCOSE UA: NEGATIVE
Ketones, UA: NEGATIVE
Leukocytes, UA: NEGATIVE
Nitrite, UA: NEGATIVE
PH UA: 7.5
Protein, UA: NEGATIVE
RBC UA: NEGATIVE
Spec Grav, UA: <=1.005
UROBILINOGEN UA: 0.2

## 2016-01-22 LAB — COMPREHENSIVE METABOLIC PANEL
ALT: 14 U/L (ref 0–53)
AST: 17 U/L (ref 0–37)
Albumin: 4.1 g/dL (ref 3.5–5.2)
Alkaline Phosphatase: 47 U/L (ref 39–117)
BILIRUBIN TOTAL: 0.6 mg/dL (ref 0.2–1.2)
BUN: 19 mg/dL (ref 6–23)
CHLORIDE: 103 meq/L (ref 96–112)
CO2: 25 meq/L (ref 19–32)
CREATININE: 0.95 mg/dL (ref 0.40–1.50)
Calcium: 9.8 mg/dL (ref 8.4–10.5)
GFR: 81.11 mL/min (ref >60.00)
GLUCOSE: 119 mg/dL — AB (ref 70–99)
Potassium: 4.3 mEq/L (ref 3.5–5.1)
SODIUM: 136 meq/L (ref 135–145)
Total Protein: 6.7 g/dL (ref 6.0–8.3)

## 2016-01-22 LAB — LIPID PANEL
CHOL/HDL RATIO: 4
Cholesterol: 176 mg/dL (ref 0–200)
HDL: 41.8 mg/dL (ref >39.00)
LDL CALC: 106 mg/dL — AB (ref 0–99)
NONHDL: 134.05
Triglycerides: 140 mg/dL (ref 0.0–149.0)
VLDL: 28 mg/dL (ref 0.0–40.0)

## 2016-01-22 LAB — MICROALBUMIN / CREATININE URINE RATIO
CREATININE, U: 58.9 mg/dL
MICROALB UR: 2.8 mg/dL — AB (ref 0.0–1.9)
MICROALB/CREAT RATIO: 4.8 mg/g (ref 0.0–30.0)

## 2016-01-22 LAB — HEMOGLOBIN A1C: Hgb A1c MFr Bld: 7.4 % — ABNORMAL HIGH (ref 4.6–6.5)

## 2016-01-22 NOTE — Telephone Encounter (Signed)
Patient states that his machine is working properly, his CPAP is on 13cm H2O, and the Humidity is on 72 degrees with a humidity level of 9.  He said that he feels like he is suffocating - he said about an hour after using the machine he has to take it off, get water to drink.  This happens throughout the night, and he's getting up 8-9 times a night.   He is questioning what can be done.  Routed to [place] to advise what we need to do.

## 2016-01-25 NOTE — Telephone Encounter (Signed)
Please ordr inlab CPAP titration ASAP

## 2016-01-26 ENCOUNTER — Other Ambulatory Visit (INDEPENDENT_AMBULATORY_CARE_PROVIDER_SITE_OTHER): Payer: Medicare Other

## 2016-01-26 DIAGNOSIS — I5032 Chronic diastolic (congestive) heart failure: Secondary | ICD-10-CM

## 2016-01-26 LAB — BASIC METABOLIC PANEL
BUN: 26 mg/dL — AB (ref 7–25)
CALCIUM: 9.6 mg/dL (ref 8.6–10.3)
CHLORIDE: 102 mmol/L (ref 98–110)
CO2: 26 mmol/L (ref 20–31)
CREATININE: 1.15 mg/dL (ref 0.70–1.18)
Glucose, Bld: 134 mg/dL — ABNORMAL HIGH (ref 65–99)
Potassium: 4.5 mmol/L (ref 3.5–5.3)
Sodium: 136 mmol/L (ref 135–146)

## 2016-01-28 ENCOUNTER — Encounter: Payer: Self-pay | Admitting: Cardiology

## 2016-01-28 ENCOUNTER — Encounter: Payer: Self-pay | Admitting: Endocrinology

## 2016-01-28 ENCOUNTER — Ambulatory Visit (INDEPENDENT_AMBULATORY_CARE_PROVIDER_SITE_OTHER): Payer: Medicare Other | Admitting: Endocrinology

## 2016-01-28 ENCOUNTER — Other Ambulatory Visit: Payer: Self-pay | Admitting: *Deleted

## 2016-01-28 VITALS — BP 132/66 | HR 64 | Temp 99.0°F | Resp 16 | Ht 69.0 in | Wt 230.0 lb

## 2016-01-28 DIAGNOSIS — D649 Anemia, unspecified: Secondary | ICD-10-CM | POA: Diagnosis not present

## 2016-01-28 DIAGNOSIS — I10 Essential (primary) hypertension: Secondary | ICD-10-CM | POA: Diagnosis not present

## 2016-01-28 DIAGNOSIS — E1165 Type 2 diabetes mellitus with hyperglycemia: Secondary | ICD-10-CM | POA: Diagnosis not present

## 2016-01-28 DIAGNOSIS — Z0000 Encounter for general adult medical examination without abnormal findings: Secondary | ICD-10-CM | POA: Diagnosis not present

## 2016-01-28 DIAGNOSIS — Z794 Long term (current) use of insulin: Secondary | ICD-10-CM

## 2016-01-28 DIAGNOSIS — E785 Hyperlipidemia, unspecified: Secondary | ICD-10-CM

## 2016-01-28 DIAGNOSIS — G4733 Obstructive sleep apnea (adult) (pediatric): Secondary | ICD-10-CM

## 2016-01-28 MED ORDER — PRAVASTATIN SODIUM 80 MG PO TABS: 80.0000 mg | ORAL_TABLET | Freq: Every day | ORAL | Status: DC

## 2016-01-28 NOTE — Progress Notes (Signed)
Patient ID: Todd Romero, male   DOB: 1933/09/26, 82 y.o.   MRN: [id number]    Reason for Appointment: Complete physical exam and management of chronic problems    History of Present Illness   Diagnosis: Type 2 DIABETES MELITUS, date of diagnosis:  1997   He has been on various regimens for his diabetes and has been on bedtime insulin with NPH since 2005 He also has benefited from adding Victoza in 2011 with better postprandial control and some weight loss Previously his weight has been as much as 247 pounds  On Victoza  since 11/14 with further improvement in blood sugar control.  Since about 2015 his blood sugars have been overall mildly high with A1c over 7% consistently. On this visit A1c is about the same at 7.4, previously 7.3  Oral hypoglycemic drugs: Amaryl in the a.m. and metformin        Side effects from medications: None Insulin regimen: NPH 22 units at bedtime    Current management, blood sugar patterns and problems identified:  His fasting blood sugars are usually excellent and near normal  No nocturnal hypoglycemia reported  He has sporadic high readings after meals, mostly after evening meal but occasionally after lunch  His wife says that he is not always watching portion despite taking 1.8 mg Victoza  When eating out or with foods that are higher in carbohydrate or fat his blood sugars may occasionally go up over 200  He is trying to walk a little but limited by shortness of breath  has difficulty losing weight   Monitors blood glucose:about 2x a day.    Glucometer:  Accu-Chek         Blood Glucose readings from meter download:   Mean values apply above for all meters except median for One Touch  PRE-MEAL Fasting Lunch Dinner  Overall  Glucose range: 92-132       Mean/median: 113    134   POST-MEAL PC Breakfast PC Lunch PC Dinner  Glucose range:  103-264  138-203   Mean/median:  141  170     Physical activity: exercise: Unable to  do much Meal times: Dinner about 5-7 PM  Wt Readings from Last 3 Encounters:  01/28/16 230 lb (104.327 kg)  01/19/16 237 lb 3.2 oz (107.593 kg)  01/05/16 240 lb 12.8 oz (109.226 kg)   LABS: Lab Results  Component Value Date   HGBA1C 7.4* 01/22/2016   HGBA1C 7.3* 09/24/2015   HGBA1C 7.2* 05/06/2015   Lab Results  Component Value Date   MICROALBUR 2.8* 01/22/2016   LDLCALC 106* 01/22/2016   CREATININE 1.15 01/26/2016    PREVENTIVE care:    Fall prevention: He has not had any tendency to falls        Advised regular self-examination of his feet   Nutrition needs no further adjustment except controlling portions  Lipid screening done  Colorectal screening as above  Regular eye and dental  exams to be continued   Continue 81 mg aspirin   Diabetes: Discussed possibly adding short acting insulin if he has high sugars from steroid in the future  Also would recommend following up with cardiologist if he has any other shortness of breath on exertion  He will start checking blood pressure regularly and call if high at home also  Vaccines   Zostavax / Pneumococcal Vaccine/ influenza  up-to-date Labs up to date   EKG done as of 06/2013 OTHER active problems are discussed in review  of systems  Lab on 01/26/2016  Component Date Value Ref Range Status  . Sodium 01/26/2016 136  135 - 146 mmol/L Final  . Potassium 01/26/2016 4.5  3.5 - 5.3 mmol/L Final  . Chloride 01/26/2016 102  98 - 110 mmol/L Final  . CO2 01/26/2016 26  20 - 31 mmol/L Final  . Glucose, Bld 01/26/2016 134* 65 - 99 mg/dL Final  . BUN 01/26/2016 26* 7 - 25 mg/dL Final  . Creat 01/26/2016 1.15  0.70 - 1.18 mg/dL Final  . Calcium 01/26/2016 9.6  8.6 - 10.3 mg/dL Final  Lab on 01/22/2016  Component Date Value Ref Range Status  . Hgb A1c MFr Bld 01/22/2016 7.4* 4.6 - 6.5 % Final   Glycemic Control Guidelines for People with Diabetes:Non Diabetic:  <6%Goal of Therapy: <7%Additional Action Suggested:  >8%   .  Sodium 01/22/2016 136  135 - 145 mEq/L Final  . Potassium 01/22/2016 4.3  3.5 - 5.1 mEq/L Final  . Chloride 01/22/2016 103  96 - 112 mEq/L Final  . CO2 01/22/2016 25  19 - 32 mEq/L Final  . Glucose, Bld 01/22/2016 119* 70 - 99 mg/dL Final  . BUN 01/22/2016 19  6 - 23 mg/dL Final  . Creatinine, Ser 01/22/2016 0.95  0.40 - 1.50 mg/dL Final  . Total Bilirubin 01/22/2016 0.6  0.2 - 1.2 mg/dL Final  . Alkaline Phosphatase 01/22/2016 47  39 - 117 U/L Final  . AST 01/22/2016 17  0 - 37 U/L Final  . ALT 01/22/2016 14  0 - 53 U/L Final  . Total Protein 01/22/2016 6.7  6.0 - 8.3 g/dL Final  . Albumin 01/22/2016 4.1  3.5 - 5.2 g/dL Final  . Calcium 01/22/2016 9.8  8.4 - 10.5 mg/dL Final  . GFR 01/22/2016 81.11  >60.00 mL/min Final  . Cholesterol 01/22/2016 176  0 - 200 mg/dL Final   ATP III Classification       Desirable:  < 200 mg/dL               Borderline High:  200 - 239 mg/dL          High:  > = 240 mg/dL  . Triglycerides 01/22/2016 140.0  0.0 - 149.0 mg/dL Final   Normal:  <150 mg/dLBorderline High:  150 - 199 mg/dL  . HDL 01/22/2016 41.80  >39.00 mg/dL Final  . VLDL 01/22/2016 28.0  0.0 - 40.0 mg/dL Final  . LDL Cholesterol 01/22/2016 106* 0 - 99 mg/dL Final  . Total CHOL/HDL Ratio 01/22/2016 4   Final                  Men          Women1/2 Average Risk     3.4          3.3Average Risk          5.0          4.42X Average Risk          9.6          7.13X Average Risk          15.0          11.0                      . NonHDL 01/22/2016 134.05   Final   NOTE:  Non-HDL goal should be 30 mg/dL higher than patient's LDL goal (i.e. LDL goal of < 70 mg/dL, would  have non-HDL goal of < 100 mg/dL)  . Microalb, Ur 01/22/2016 2.8* 0.0 - 1.9 mg/dL Final  . Creatinine,U 01/22/2016 58.9   Final  . Microalb Creat Ratio 01/22/2016 4.8  0.0 - 30.0 mg/g Final  . WBC 01/22/2016 7.4  4.0 - 10.5 K/uL Final  . RBC 01/22/2016 3.99* 4.22 - 5.81 Mil/uL Final  . Platelets 01/22/2016 176.0  150.0 - 400.0 K/uL Final    . Hemoglobin 01/22/2016 11.9* 13.0 - 17.0 g/dL Final  . HCT 01/22/2016 36.0* 39.0 - 52.0 % Final  . MCV 01/22/2016 90.2  78.0 - 100.0 fl Final  . MCHC 01/22/2016 33.1  30.0 - 36.0 g/dL Final  . RDW 01/22/2016 14.4  11.5 - 15.5 % Final  . Color, UA 01/22/2016 yellow   Final  . Clarity, UA 01/22/2016 clear   Final  . Glucose, UA 01/22/2016 negative   Final  . Bilirubin, UA 01/22/2016 negative   Final  . Ketones, UA 01/22/2016 negative   Final  . Spec Grav, UA 01/22/2016 <=1.005   Final  . Blood, UA 01/22/2016 negative   Final  . pH, UA 01/22/2016 7.5   Final  . Protein, UA 01/22/2016 negative   Final  . Urobilinogen, UA 01/22/2016 0.2   Final  . Nitrite, UA 01/22/2016 negative   Final  . Leukocytes, UA 01/22/2016 Negative  Negative Final      Medication List       This list is accurate as of: 01/28/16 11:59 PM.  Always use your most recent med list.               ACCU-CHEK AVIVA PLUS test strip  Generic drug:  glucose blood  USE AS INSTRUCTED TO CHECK BLOOD SUGAR TWICE A DAY (CODE 250.00)     aspirin 81 MG tablet  Take 81 mg by mouth daily.     ferrous sulfate 325 (65 FE) MG tablet  Take 325 mg by mouth daily with breakfast.     fish oil-omega-3 fatty acids 1000 MG capsule  Take 2 g by mouth daily.     furosemide 80 MG tablet  Commonly known as:  LASIX  Take 1 tablet (80 mg total) by mouth daily.     glimepiride 2 MG tablet  Commonly known as:  AMARYL  Take 0.5 tablets (1 mg total) by mouth daily.     ibuprofen 200 MG tablet  Commonly known as:  ADVIL,MOTRIN  Take 200 mg by mouth every 6 (six) hours as needed for headache or moderate pain.     insulin NPH Human 100 UNIT/ML injection  Commonly known as:  HUMULIN N,NOVOLIN N  Inject 0.22 mLs (22 Units total) into the skin at bedtime. Uses Vial     Insulin Pen Needle 31G X 5 MM Misc  Commonly known as:  B-D UF III MINI PEN NEEDLES  Use 1 pen needle per day     Insulin Syringe-Needle U-100 31G X 5/16" 1 ML  Misc  Commonly known as:  BD INSULIN SYRINGE ULTRAFINE  Use to inject insulin daily     Liraglutide 18 MG/3ML Sopn  Commonly known as:  VICTOZA  Inject 0.3 mLs (1.8 mg total) into the skin daily.     metFORMIN 1000 MG tablet  Commonly known as:  GLUCOPHAGE  Take 1 tablet (1,000 mg total) by mouth 2 (two) times daily with a meal.     metoprolol succinate 25 MG 24 hr tablet  Commonly known as:  TOPROL-XL  Take  12.5 mg by mouth daily.     multivitamin with minerals Tabs tablet  Take 1 tablet by mouth daily.     olmesartan 40 MG tablet  Commonly known as:  BENICAR  Take 1 tablet (40 mg total) by mouth daily.     pravastatin 80 MG tablet  Commonly known as:  PRAVACHOL  Take 1 tablet (80 mg total) by mouth daily.     PRESERVISION/LUTEIN Caps  Take 1 capsule by mouth daily.     sodium chloride 0.65 % Soln nasal spray  Commonly known as:  OCEAN  Place 2 sprays into both nostrils 2 (two) times daily.     tamsulosin 0.4 MG Caps capsule  Commonly known as:  FLOMAX  Take 1 capsule (0.4 mg total) by mouth daily.        Allergies:  Allergies  Allergen Reactions  . Morphine And Related Nausea And Vomiting    Past Medical History  Diagnosis Date  . Unspecified essential hypertension   . Pure hypercholesterolemia   . Type II or unspecified type diabetes mellitus without mention of complication, not stated as uncontrolled   . RBBB   . CAD (coronary artery disease)   . Diabetes ([place])   . Hyperlipidemia   . Obesity (BMI 30-39.9) 06/18/2015  . OSA (obstructive sleep apnea)     Severe with AHI 27/hr now on CPAP    Past Surgical History  Procedure Laterality Date  . Vein surgery    . Anterior cruciate ligament repair    . Carpal tunnel release      Family History  Problem Relation Age of Onset  . Heart failure Father   . Emphysema Father   . Hypertension Mother   . Cancer Sister   . Healthy Sister     Social History:  reports that he has never smoked. He has  never used smokeless tobacco. His alcohol and drug histories are not on file.  Review of Systems:  HYPERTENSION:  Blood pressure is stable He has not checked blood pressure regularly at home but he does think it is running between [id number] systolic   His cardiologist increased the Lasix to 80 mg recently because of edema  HYPERLIPIDEMIA: The lipid abnormality consists of elevated LDL and he is taking 40 mg Pravachol, no side effects. LDL is above 100  Lab Results  Component Value Date   CHOL 176 01/22/2016   HDL 41.80 01/22/2016   LDLCALC 106* 01/22/2016   TRIG 140.0 01/22/2016   CHOLHDL 4 01/22/2016    History of daytime fatigability and somnolence if sitting still, Secondary to sleep apnea.   He had an abnormal sleep study Has having difficulty tolerating his current CPAP treatment but is starting to use it at least 4 hours at night intermittently  He complains of shortness of breath on exertion, this occurs with walking but occasionally he feels like needing to take a deep breath.  Recently is reading appears to be somewhat better  History of overactive bladder for which he takes Enablex only when he is traveling  Mild anemia: He is taking iron supplements 3 times a week and has had stable hemoglobin levels near normal  No history of B-12 deficiency   Lab Results  Component Value Date   WBC 7.4 01/22/2016   HGB 11.9* 01/22/2016   HCT 36.0* 01/22/2016   MCV 90.2 01/22/2016   PLT 176.0 01/22/2016   Foot exam last done in 9/16  Review of Systems  Constitutional: Negative for  malaise/fatigue.  Eyes: Negative for blurred vision.  Respiratory: Positive for shortness of breath.   Cardiovascular: Positive for leg swelling. Negative for orthopnea and PND.  Gastrointestinal: Positive for diarrhea. Negative for nausea and abdominal pain.  Genitourinary: Negative for dysuria.  Musculoskeletal: Positive for joint pain.  Neurological: Negative for dizziness and headaches.       Examination:   BP 132/66 mmHg  Pulse 64  Temp(Src) 99 F (37.2 C)  Resp 16  Ht 5\' 9"  (1.753 m)  Wt 230 lb (104.327 kg)  BMI 33.95 kg/m2  SpO2 97%  Body mass index is 33.95 kg/(m^2).   Standing blood pressure 130/70 left arm  Physical Exam  HENT:  Mouth/Throat: Oropharynx is clear and moist.  Eyes: Conjunctivae are normal.  Fundi show no retinopathy  Neck: No thyromegaly present.  Cardiovascular: Regular rhythm.   No murmur heard. Short 2/6 ejection murmur present at left sternal border, not radiating   Pulmonary/Chest: Breath sounds normal. He has no wheezes. He has no rales.  Abdominal: Soft. He exhibits no mass. There is no tenderness.  Genitourinary: Rectum normal and prostate normal.  Musculoskeletal: He exhibits no edema.  Lymphadenopathy:    He has no cervical adenopathy.  Skin: No rash noted. No pallor.  Psychiatric: He has a normal mood and affect.       ASSESSMENT/ PLAN:   Diabetes type 2 with obesity  See history of present illness for detailed discussion of his current management, blood sugar patterns and problems identified  The patient's diabetes control appears to be overall fair with occasional postprandial hyperglycemia especially when he is going overboard with portions of carbohydrates He had difficulty exercising and losing weight Since he does not have any consistent times when he has hyperglycemia with continuous regimen unchanged including Victoza and bedtime NPH  No significant diabetic complications seen. Encouraged annual eye exams and microalbumin to be checked regularly  DYSPNEA on exertion: Improving with increased diuretics.  Has  diastolic dysfunction followed by cardiologist Also has stable aortic stenosis on echo  HYPERLIPIDEMIA: He has a LDL over 100 ambulate to increase  pravastatin to 80 mg  Sleep apnea, followed by pulmonologist, patient is trying to use his CPAP.  Emphasized need for continued treatment and discussed  adverse effects of untreated sleep apnea  HYPERTENSION: Blood pressure is  better now with increasing Lasix, electrolytes normal No orthostasis and renal function normal recently  PREVENTIVE care:  Stool Hemoccult given He is up-to-date with vaccinations, annual eye exams and foot exam Information on living will and POA given, He will complete this  Osteoarthritis right knee: He will follow-up with his orthopedic Surgeon for Synvisc injections   Todd Romero 01/29/2016, 12:00 PM

## 2016-01-29 NOTE — Telephone Encounter (Signed)
Please change to auto CPAP from 4 to 18cm H2O and get a download in 2 weeks

## 2016-01-29 NOTE — Telephone Encounter (Signed)
Patient states that he will not go back to the sleep lab. He states that does not have any luck when he goes over there, and he stated that he would stop wearing the machine all together before he goes back. I expressed that we needed to get him properly titrated for him to be able to get the proper use from the machine.  I let him know that he needed to continue to use the machine and that I would reach back out to Dr. Radford Pax to see what else we could do.

## 2016-01-29 NOTE — Telephone Encounter (Signed)
Left message for patient to call back  

## 2016-01-30 NOTE — Addendum Note (Signed)
Addended by: Andres Ege on: 01/30/2016 10:36 AM   Modules accepted: Orders

## 2016-01-30 NOTE — Telephone Encounter (Signed)
Spoke with patient and he is much happier to do this at home.   Order has been placed, [facility] notified.

## 2016-02-06 ENCOUNTER — Telehealth: Payer: Self-pay | Admitting: Interventional Cardiology

## 2016-02-06 DIAGNOSIS — I5032 Chronic diastolic (congestive) heart failure: Secondary | ICD-10-CM

## 2016-02-06 NOTE — Telephone Encounter (Signed)
New message     Pt c/o medication issue:  1. Name of Medication: lasix 2. How are you currently taking this medication (dosage and times per day)? 80mg  by 2pm daily 3. Are you having a reaction (difficulty breathing--STAT)? no 4. What is your medication issue? Pt want to go back to 40mg .  He states he cannot leave the house while on 80mg  and it is also causing diarrhea.  He also states that his wt is staying around 225lbs.  Can he switch back to 40mg ?

## 2016-02-06 NOTE — Telephone Encounter (Signed)
Spoke with pt and he states that since he started the Lasix 80mg  QD he has had problems with diarrhea and is unable to leave the house after he takes it. Pt does not take his vitals at home but states BP was 130/68 when he recently seen Dr. Dwyane Dee. Pt states his swelling is much improved and he doesn't feel like he really has any.  Pt still wearing compression stockings. Weight stays 224-225lbs. Pt would like to change back to Lasix 40mg  and denies any GI issues when taking the lower dose. Advised pt that I will route this message to Dr. Tamala Julian for review and advisement.

## 2016-02-07 NOTE — Telephone Encounter (Signed)
Decrease the furosemide to 40 mg and continue the daily weights. If it increases to >230 lbs we will need to increase the diuretic to prevent re-accumulation of fluid.

## 2016-02-09 MED ORDER — FUROSEMIDE 80 MG PO TABS: 40.0000 mg | ORAL_TABLET | Freq: Every day | ORAL | Status: DC

## 2016-02-09 NOTE — Telephone Encounter (Signed)
Pt aware of Dr.Smith's response. Decrease the furosemide to 40 mg and continue the daily weights. If it increases to >230 lbs we will need to increase the diuretic to prevent re-accumulation of fluid. Pt agreeable with plan and verbalized understanding.

## 2016-02-10 ENCOUNTER — Other Ambulatory Visit: Payer: Medicare Other

## 2016-02-11 ENCOUNTER — Other Ambulatory Visit: Payer: Medicare Other

## 2016-02-11 NOTE — Progress Notes (Signed)
Cardiology Office Note    Date:  02/12/2016   ID:  Griffon Leyendecker Todd Romero, DOB Jan 12, 1934, MRN [id number]  PCP:  [name], MD  Cardiologist: Sinclair Grooms, MD   Chief Complaint  Patient presents with  . Congestive Heart Failure  . Cardiac Valve Problem  . Coronary Artery Disease    History of Present Illness:  Todd Romero is a 82 y.o. male for follow-up of chronic diastolic heart failure, volume overload, CAD, right bundle branch block, and obstructive sleep apnea.  The patient feels better. Lower extremity swelling has improved with chronic compression stockings. Double in diuretic intensity intensity led to 8-9 pound diuresis, improvement in dyspnea, and also helped to decrease lower extremity edema. He is now feeling well and left that he wants to discontinue pression stockings, decrease the dose of diuretic (he says that 80 mg of furosemide causes diarrhea), and to discontinue some of the medications as he feels so much better.  We spent considerable time discussing the impact of diuretic therapy on diastolic heart failure. We will leave his current diuretic dose of 40 mg with an order for him to measure his weight daily and if he gets above 228 pounds on his home scales to contact me and we will you to increase furosemide to 60 mg daily or add spironolactone to 40 mg of furosemide.     Past Medical History  Diagnosis Date  . Unspecified essential hypertension   . Pure hypercholesterolemia   . Type II or unspecified type diabetes mellitus without mention of complication, not stated as uncontrolled   . RBBB   . CAD (coronary artery disease)   . Diabetes ([place])   . Hyperlipidemia   . Obesity (BMI 30-39.9) 06/18/2015  . OSA (obstructive sleep apnea)     Severe with AHI 27/hr now on CPAP    Past Surgical History  Procedure Laterality Date  . Vein surgery    . Anterior cruciate ligament repair    . Carpal tunnel release      Current Medications: Outpatient  Prescriptions Prior to Visit  Medication Sig Dispense Refill  . ACCU-CHEK AVIVA PLUS test strip USE AS INSTRUCTED TO CHECK BLOOD SUGAR TWICE A DAY (CODE 250.00) 200 each 3  . aspirin 81 MG tablet Take 81 mg by mouth daily.    . ferrous sulfate 325 (65 FE) MG tablet Take 325 mg by mouth daily with breakfast.    . fish oil-omega-3 fatty acids 1000 MG capsule Take 2 g by mouth daily.    . furosemide (LASIX) 80 MG tablet Take 0.5 tablets (40 mg total) by mouth daily. 40 tablet 0  . glimepiride (AMARYL) 2 MG tablet Take 0.5 tablets (1 mg total) by mouth daily. 45 tablet 1  . ibuprofen (ADVIL,MOTRIN) 200 MG tablet Take 200 mg by mouth every 6 (six) hours as needed for headache or moderate pain.     [name] insulin NPH Human (HUMULIN N,NOVOLIN N) 100 UNIT/ML injection Inject 0.22 mLs (22 Units total) into the skin at bedtime. Uses Vial 20 mL 1  . Insulin Pen Needle (B-D UF III MINI PEN NEEDLES) 31G X 5 MM MISC Use 1 pen needle per day 100 each 2  . Insulin Syringe-Needle U-100 (BD INSULIN SYRINGE ULTRAFINE) 31G X 5/16" 1 ML MISC Use to inject insulin daily 100 each 5  . Liraglutide (VICTOZA) 18 MG/3ML SOPN Inject 0.3 mLs (1.8 mg total) into the skin daily. 9 pen 1  . metFORMIN (GLUCOPHAGE) 1000 MG  tablet Take 1 tablet (1,000 mg total) by mouth 2 (two) times daily with a meal. 180 tablet 1  . metoprolol succinate (TOPROL-XL) 25 MG 24 hr tablet Take 12.5 mg by mouth daily.    . Multiple Vitamin (MULTIVITAMIN WITH MINERALS) TABS tablet Take 1 tablet by mouth daily.    . Multiple Vitamins-Minerals (PRESERVISION/LUTEIN) CAPS Take 1 capsule by mouth daily.     [name] olmesartan (BENICAR) 40 MG tablet Take 1 tablet (40 mg total) by mouth daily. 90 tablet 3  . pravastatin (PRAVACHOL) 80 MG tablet Take 1 tablet (80 mg total) by mouth daily. 90 tablet 1  . sodium chloride (OCEAN) 0.65 % SOLN nasal spray Place 2 sprays into both nostrils 2 (two) times daily.  0  . tamsulosin (FLOMAX) 0.4 MG CAPS capsule Take 1 capsule (0.4  mg total) by mouth daily. 90 capsule 1   No facility-administered medications prior to visit.     Allergies:   Morphine and related   Social History   Social History  . Marital Status: Married    Spouse Name: N/A  . Number of Children: N/A  . Years of Education: N/A   Social History Main Topics  . Smoking status: Never Smoker   . Smokeless tobacco: Never Used  . Alcohol Use: None  . Drug Use: None  . Sexual Activity: Not Asked   Other Topics Concern  . None   Social History Narrative     Family History:  The patient's family history includes Cancer in his sister; Emphysema in his father; Healthy in his sister; Heart failure in his father; Hypertension in his mother.   ROS:   Please see the history of present illness.    Diarrhea when taking 40 mg of furosemide twice daily. Difficulty with balance. Otherwise improved.  All other systems reviewed and are negative.   PHYSICAL EXAM:   VS:  BP 140/70 mmHg  Pulse 61  Ht 5\' 10"  (1.778 m)  Wt 233 lb 2 oz (105.745 kg)  BMI 33.45 kg/m2   GEN: Well nourished, well developed, in no acute distress HEENT: normal Neck: no JVD, carotid bruits, or masses Cardiac: RRR; there is a 3/6 crescendo decrescendo systolic murmur of aortic stenosis. No rubs  or gallops. There is 1+ bilateral lower extremity edema  Respiratory:  clear to auscultation bilaterally, normal work of breathing GI: soft, nontender, nondistended, + BS MS: no deformity or atrophy Skin: warm and dry, no rash Neuro:  Alert and Oriented x 3, Strength and sensation are intact Psych: euthymic mood, full affect  Wt Readings from Last 3 Encounters:  02/12/16 233 lb 2 oz (105.745 kg)  01/28/16 230 lb (104.327 kg)  01/19/16 237 lb 3.2 oz (107.593 kg)      Studies/Labs Reviewed:   EKG:  EKG  Not repeated  Recent Labs: 12/18/2015: Brain Natriuretic Peptide 75.7 01/22/2016: ALT 14; Hemoglobin 11.9*; Platelets 176.0 01/26/2016: BUN 26*; Creat 1.15; Potassium 4.5;  Sodium 136   Lipid Panel    Component Value Date/Time   CHOL 176 01/22/2016 0923   TRIG 140.0 01/22/2016 0923   HDL 41.80 01/22/2016 0923   CHOLHDL 4 01/22/2016 0923   VLDL 28.0 01/22/2016 0923   LDLCALC 106* 01/22/2016 0923    Additional studies/ records that were reviewed today include:  Weights have been reviewed    ASSESSMENT:    1. Coronary artery disease involving native coronary artery of native heart without angina pectoris   2. Chronic diastolic heart failure ([place])  3. Essential hypertension, benign   4. Hyperlipidemia   5. Aortic stenosis   6. RBBB   7. OSA (obstructive sleep apnea)      PLAN:  In order of problems listed above:  1. No ischemic symptoms at this time 2. We have saw the issue of dyspnea and lower extremity swelling by more aggressive diuresis. The patient is now complaining of diarrhea on 80 mg of furosemide. He has decreased the dose back to 40 mg per day. We have decided to keep him at 40 mg but to wait daily. If his weight increases beyond 228 pounds, we will need to reintensify the diuretic regimen by either increasing furosemide to 60 mg per day or adding spironolactone to the 40 mg furosemide 3. Much better controlled. 4.     Medication Adjustments/Labs and Tests Ordered: Current medicines are reviewed at length with the patient today.  Concerns regarding medicines are outlined above.  Medication changes, Labs and Tests ordered today are listed in the Patient Instructions below. Patient Instructions  Medication Instructions:  Your physician recommends that you continue on your current medications as directed. Please refer to the Current Medication list given to you today.   Labwork: None ordered   Testing/Procedures: None ordered   Follow-Up: Your physician recommends that you schedule a follow-up appointment in: 3 months with Dr Tamala Julian   Any Other Special Instructions Will Be Listed Below (If Applicable).  If your weight  gets back to 228, please call the office as we will need to adjust your fluid pull     If you need a refill on your cardiac medications before your next appointment, please call your pharmacy.       Signed, Sinclair Grooms, MD  02/12/2016 10:35 AM    [facility] [address], [place], [place]  [address] Phone: [phone]; Fax: [phone]

## 2016-02-12 ENCOUNTER — Other Ambulatory Visit (INDEPENDENT_AMBULATORY_CARE_PROVIDER_SITE_OTHER): Payer: Medicare Other

## 2016-02-12 ENCOUNTER — Encounter: Payer: Self-pay | Admitting: Interventional Cardiology

## 2016-02-12 ENCOUNTER — Telehealth: Payer: Self-pay | Admitting: Endocrinology

## 2016-02-12 ENCOUNTER — Ambulatory Visit (INDEPENDENT_AMBULATORY_CARE_PROVIDER_SITE_OTHER): Payer: Medicare Other | Admitting: Interventional Cardiology

## 2016-02-12 VITALS — BP 140/70 | HR 61 | Ht 70.0 in | Wt 233.1 lb

## 2016-02-12 DIAGNOSIS — I350 Nonrheumatic aortic (valve) stenosis: Secondary | ICD-10-CM

## 2016-02-12 DIAGNOSIS — G4733 Obstructive sleep apnea (adult) (pediatric): Secondary | ICD-10-CM

## 2016-02-12 DIAGNOSIS — I2510 Atherosclerotic heart disease of native coronary artery without angina pectoris: Secondary | ICD-10-CM

## 2016-02-12 DIAGNOSIS — D649 Anemia, unspecified: Secondary | ICD-10-CM | POA: Diagnosis not present

## 2016-02-12 DIAGNOSIS — I5032 Chronic diastolic (congestive) heart failure: Secondary | ICD-10-CM

## 2016-02-12 DIAGNOSIS — E785 Hyperlipidemia, unspecified: Secondary | ICD-10-CM | POA: Diagnosis not present

## 2016-02-12 DIAGNOSIS — I10 Essential (primary) hypertension: Secondary | ICD-10-CM

## 2016-02-12 DIAGNOSIS — I4510 Unspecified right bundle-branch block: Secondary | ICD-10-CM

## 2016-02-12 NOTE — Telephone Encounter (Signed)
IFOB order needs to be put in for the pt for joyce at [facility]

## 2016-02-12 NOTE — Patient Instructions (Addendum)
Medication Instructions:  Your physician recommends that you continue on your current medications as directed. Please refer to the Current Medication list given to you today.   Labwork: None ordered   Testing/Procedures: None ordered   Follow-Up: Your physician recommends that you schedule a follow-up appointment in: 3 months with Dr Tamala Julian   Any Other Special Instructions Will Be Listed Below (If Applicable).  If your weight gets back to 228, please call the office as we will need to adjust your fluid pull     If you need a refill on your cardiac medications before your next appointment, please call your pharmacy.

## 2016-02-13 ENCOUNTER — Other Ambulatory Visit: Payer: Self-pay

## 2016-02-13 DIAGNOSIS — D649 Anemia, unspecified: Secondary | ICD-10-CM

## 2016-02-13 LAB — FECAL OCCULT BLOOD, IMMUNOCHEMICAL: FECAL OCCULT BLD: NEGATIVE

## 2016-02-13 NOTE — Telephone Encounter (Signed)
Lab entered into epic.

## 2016-02-15 NOTE — Progress Notes (Signed)
Quick Note:  Please let patient know that the result is normal and no further action needed ______

## 2016-02-17 ENCOUNTER — Telehealth: Payer: Self-pay | Admitting: Endocrinology

## 2016-02-17 NOTE — Telephone Encounter (Signed)
PT said that he was returning a call to the nurse, he said you may leave a message.

## 2016-02-27 ENCOUNTER — Telehealth: Payer: Self-pay | Admitting: Interventional Cardiology

## 2016-02-27 DIAGNOSIS — Z79899 Other long term (current) drug therapy: Secondary | ICD-10-CM

## 2016-02-27 NOTE — Telephone Encounter (Signed)
New message      Pt was instructed to call if his weigh reached 228 lbs.  He was 228 lbs yesterday.  Please call

## 2016-02-27 NOTE — Telephone Encounter (Signed)
Patient called in letting us know his wait is at 228 lbs. Below is part of the plan for patient from his last office visit. Will route to Dr. Tamala Julian to see which regimen he wants to go with.  1. "We have saw the issue of dyspnea and lower extremity swelling by more aggressive diuresis. The patient is now complaining of diarrhea on 80 mg of furosemide. He has decreased the dose back to 40 mg per day. We have decided to keep him at 40 mg but to wait daily. If his weight increases beyond 228 pounds, we will need to reintensify the diuretic regimen by either increasing furosemide to 60 mg per day or adding spironolactone to the 40 mg furosemide"

## 2016-02-29 NOTE — Telephone Encounter (Signed)
Start spironolactone 25 mg daily. BMET 1 week later

## 2016-03-01 MED ORDER — SPIRONOLACTONE 25 MG PO TABS: 25.0000 mg | ORAL_TABLET | Freq: Every day | ORAL | Status: DC

## 2016-03-01 NOTE — Telephone Encounter (Signed)
Called patient back. Informed patient about Dr. Thompson Caul message. Per Dr. Tamala Julian, start spironolactone 25 mg daily. BMET one week later. Patient verbalized understanding and will come in 03/08/16 for BMET.

## 2016-03-02 DIAGNOSIS — Z961 Presence of intraocular lens: Secondary | ICD-10-CM

## 2016-03-02 DIAGNOSIS — H43812 Vitreous degeneration, left eye: Secondary | ICD-10-CM | POA: Insufficient documentation

## 2016-03-02 HISTORY — DX: Vitreous degeneration, left eye: H43.812

## 2016-03-02 HISTORY — DX: Presence of intraocular lens: Z96.1

## 2016-03-08 ENCOUNTER — Other Ambulatory Visit (INDEPENDENT_AMBULATORY_CARE_PROVIDER_SITE_OTHER): Payer: Medicare Other | Admitting: *Deleted

## 2016-03-08 DIAGNOSIS — Z79899 Other long term (current) drug therapy: Secondary | ICD-10-CM | POA: Diagnosis not present

## 2016-03-08 LAB — BASIC METABOLIC PANEL
BUN: 28 mg/dL — AB (ref 7–25)
CO2: 21 mmol/L (ref 20–31)
Calcium: 9.5 mg/dL (ref 8.6–10.3)
Chloride: 103 mmol/L (ref 98–110)
Creat: 1.05 mg/dL (ref 0.70–1.18)
GLUCOSE: 206 mg/dL — AB (ref 65–99)
POTASSIUM: 4.6 mmol/L (ref 3.5–5.3)
SODIUM: 136 mmol/L (ref 135–146)

## 2016-03-08 NOTE — Addendum Note (Signed)
Addended by: Eulis Foster on: 03/08/2016 10:59 AM   Modules accepted: Orders

## 2016-03-17 ENCOUNTER — Encounter: Payer: Self-pay | Admitting: Cardiology

## 2016-03-17 ENCOUNTER — Ambulatory Visit (INDEPENDENT_AMBULATORY_CARE_PROVIDER_SITE_OTHER): Payer: Medicare Other | Admitting: Cardiology

## 2016-03-17 VITALS — BP 148/76 | HR 61 | Ht 70.0 in | Wt 234.6 lb

## 2016-03-17 DIAGNOSIS — I10 Essential (primary) hypertension: Secondary | ICD-10-CM | POA: Diagnosis not present

## 2016-03-17 DIAGNOSIS — G4733 Obstructive sleep apnea (adult) (pediatric): Secondary | ICD-10-CM | POA: Diagnosis not present

## 2016-03-17 DIAGNOSIS — E669 Obesity, unspecified: Secondary | ICD-10-CM | POA: Diagnosis not present

## 2016-03-17 NOTE — Addendum Note (Signed)
Addended by: Harland German A on: 03/17/2016 11:49 AM   Modules accepted: Orders

## 2016-03-17 NOTE — Progress Notes (Signed)
Cardiology Office Note    Date:  03/17/2016   ID:  Todd Romero, DOB 04-17-1934, MRN [id number]  PCP:  [name], MD  Cardiologist:  Fransico Him, MD   Chief Complaint  Patient presents with  . Sleep Apnea  . Hypertension    History of Present Illness:  Todd Romero is a 82 y.o. male  who presents for followup of OSA. He has severe OSA with an AHI of 27/hr with most events occurring in REM sleep in the non supine position. Oxygen saturations dropped to 79%. He is on CPAP at 11cm H2O. He tolerates the full face mask but he is not doing well with his mask.  He is waking up a lot at night with a dry mouth.  He is using the humidity which he has at the maximum setting but it is not helping. He says that he feels the pressure is adequate.His wife says that he is not snoring.  He feels rested in the am with no daytime sleepiness.    Past Medical History  Diagnosis Date  . Unspecified essential hypertension   . Pure hypercholesterolemia   . Type II or unspecified type diabetes mellitus without mention of complication, not stated as uncontrolled   . RBBB   . CAD (coronary artery disease)   . Diabetes ([place])   . Hyperlipidemia   . Obesity (BMI 30-39.9) 06/18/2015  . OSA (obstructive sleep apnea)     Severe with AHI 27/hr now on CPAP    Past Surgical History  Procedure Laterality Date  . Vein surgery    . Anterior cruciate ligament repair    . Carpal tunnel release      Current Medications: Outpatient Prescriptions Prior to Visit  Medication Sig Dispense Refill  . ACCU-CHEK AVIVA PLUS test strip USE AS INSTRUCTED TO CHECK BLOOD SUGAR TWICE A DAY (CODE 250.00) 200 each 3  . aspirin 81 MG tablet Take 81 mg by mouth daily.    . ferrous sulfate 325 (65 FE) MG tablet Take 325 mg by mouth daily with breakfast.    . fish oil-omega-3 fatty acids 1000 MG capsule Take 2 g by mouth daily.    . furosemide (LASIX) 80 MG tablet Take 0.5 tablets (40 mg total) by mouth  daily. 40 tablet 0  . glimepiride (AMARYL) 2 MG tablet Take 0.5 tablets (1 mg total) by mouth daily. 45 tablet 1  . ibuprofen (ADVIL,MOTRIN) 200 MG tablet Take 200 mg by mouth every 6 (six) hours as needed for headache or moderate pain.     [name] insulin NPH Human (HUMULIN N,NOVOLIN N) 100 UNIT/ML injection Inject 0.22 mLs (22 Units total) into the skin at bedtime. Uses Vial 20 mL 1  . Insulin Pen Needle (B-D UF III MINI PEN NEEDLES) 31G X 5 MM MISC Use 1 pen needle per day 100 each 2  . Insulin Syringe-Needle U-100 (BD INSULIN SYRINGE ULTRAFINE) 31G X 5/16" 1 ML MISC Use to inject insulin daily 100 each 5  . Liraglutide (VICTOZA) 18 MG/3ML SOPN Inject 0.3 mLs (1.8 mg total) into the skin daily. 9 pen 1  . metFORMIN (GLUCOPHAGE) 1000 MG tablet Take 1 tablet (1,000 mg total) by mouth 2 (two) times daily with a meal. 180 tablet 1  . metoprolol succinate (TOPROL-XL) 25 MG 24 hr tablet Take 12.5 mg by mouth daily.    . Multiple Vitamin (MULTIVITAMIN WITH MINERALS) TABS tablet Take 1 tablet by mouth daily.    . Multiple Vitamins-Minerals (PRESERVISION/LUTEIN)  CAPS Take 1 capsule by mouth daily.     [name] olmesartan (BENICAR) 40 MG tablet Take 1 tablet (40 mg total) by mouth daily. 90 tablet 3  . pravastatin (PRAVACHOL) 80 MG tablet Take 1 tablet (80 mg total) by mouth daily. 90 tablet 1  . sodium chloride (OCEAN) 0.65 % SOLN nasal spray Place 2 sprays into both nostrils 2 (two) times daily.  0  . spironolactone (ALDACTONE) 25 MG tablet Take 1 tablet (25 mg total) by mouth daily. 30 tablet 11  . tamsulosin (FLOMAX) 0.4 MG CAPS capsule Take 1 capsule (0.4 mg total) by mouth daily. 90 capsule 1   No facility-administered medications prior to visit.     Allergies:   Morphine and related   Social History   Social History  . Marital Status: Married    Spouse Name: N/A  . Number of Children: N/A  . Years of Education: N/A   Social History Main Topics  . Smoking status: Never Smoker   . Smokeless tobacco:  Never Used  . Alcohol Use: None  . Drug Use: None  . Sexual Activity: Not Asked   Other Topics Concern  . None   Social History Narrative     Family History:  The patient's family history includes Cancer in his sister; Emphysema in his father; Healthy in his sister; Heart failure in his father; Hypertension in his mother.   ROS:   Please see the history of present illness.    ROS All other systems reviewed and are negative.   PHYSICAL EXAM:   VS:  BP 148/76 mmHg  Pulse 61  Ht 5\' 10"  (1.778 m)  Wt 234 lb 9.6 oz (106.414 kg)  BMI 33.66 kg/m2  SpO2 96%   GEN: Well nourished, well developed, in no acute distress HEENT: normal Neck: no JVD, carotid bruits, or masses Cardiac: RRR; no rubs, or gallops,no edema.  Intact distal pulses bilaterally. 2/6 SM at RUSB to LLSB Respiratory:  clear to auscultation bilaterally, normal work of breathing GI: soft, nontender, nondistended, + BS MS: no deformity or atrophy Skin: warm and dry, no rash Neuro:  Alert and Oriented x 3, Strength and sensation are intact Psych: euthymic mood, full affect  Wt Readings from Last 3 Encounters:  03/17/16 234 lb 9.6 oz (106.414 kg)  02/12/16 233 lb 2 oz (105.745 kg)  01/28/16 230 lb (104.327 kg)      Studies/Labs Reviewed:   EKG:  EKG is not ordered today.    Recent Labs: 12/18/2015: Brain Natriuretic Peptide 75.7 01/22/2016: ALT 14; Hemoglobin 11.9*; Platelets 176.0 03/08/2016: BUN 28*; Creat 1.05; Potassium 4.6; Sodium 136   Lipid Panel    Component Value Date/Time   CHOL 176 01/22/2016 0923   TRIG 140.0 01/22/2016 0923   HDL 41.80 01/22/2016 0923   CHOLHDL 4 01/22/2016 0923   VLDL 28.0 01/22/2016 0923   LDLCALC 106* 01/22/2016 0923    Additional studies/ records that were reviewed today include:  CPAP downlaod    ASSESSMENT:    1. Obstructive sleep apnea   2. Essential hypertension, benign   3. Obesity (BMI 30-39.9)      PLAN:  In order of problems listed above:  OSA - the  patient is tolerating PAP therapy well without any problems. The PAP download was reviewed today and showed an AHI of 10.3/hr on 11 cm H2O with 93% compliance in using more than 4 hours nightly.  The patient has been using and benefiting from CPAP use and will  continue to benefit from therapy. He is really struggling with the CPAP full face mask so I will order a nasal pillow mask with chin strap and given his elevated AHI with no evidence of mask leak, I will get a 2 week autotitration from 4 to 20cm H2O.   HTN - Bp controlled on current medical regimen.  Continue BB/SRB/diuretic 3.   Obesity - His exercise is limited by chronic knee pain   Medication Adjustments/Labs and Tests Ordered: Current medicines are reviewed at length with the patient today.  Concerns regarding medicines are outlined above.  Medication changes, Labs and Tests ordered today are listed in the Patient Instructions below.  There are no Patient Instructions on file for this visit.   Signed, Fransico Him, MD  03/17/2016 11:17 AM    [facility] [address], [place], [place]  [address] Phone: [phone]; Fax: [phone]

## 2016-03-17 NOTE — Patient Instructions (Signed)
Medication Instructions:  Your physician recommends that you continue on your current medications as directed. Please refer to the Current Medication list given to you today.   Labwork: None  Testing/Procedures: None  Follow-Up: Your physician wants you to follow-up in: 1 year with Dr. Radford Pax. You will receive a reminder letter in the mail two months in advance. If you don't receive a letter, please call our office to schedule the follow-up appointment.   Any Other Special Instructions Will Be Listed Below (If Applicable). A new PAP mask has been ordered for you. Once you get your mask, you will have an auto-titration to determine your best-fit settings.    If you need a refill on your cardiac medications before your next appointment, please call your pharmacy.

## 2016-03-24 ENCOUNTER — Encounter: Payer: Self-pay | Admitting: Cardiology

## 2016-03-25 ENCOUNTER — Telehealth: Payer: Self-pay | Admitting: Interventional Cardiology

## 2016-03-25 ENCOUNTER — Other Ambulatory Visit: Payer: Self-pay | Admitting: *Deleted

## 2016-03-25 ENCOUNTER — Telehealth: Payer: Self-pay | Admitting: Endocrinology

## 2016-03-25 MED ORDER — SPIRONOLACTONE 25 MG PO TABS: 25.0000 mg | ORAL_TABLET | Freq: Every day | ORAL | Status: DC

## 2016-03-25 MED ORDER — TAMSULOSIN HCL 0.4 MG PO CAPS: 0.4000 mg | ORAL_CAPSULE | Freq: Every day | ORAL | Status: DC

## 2016-03-25 NOTE — Telephone Encounter (Signed)
PT needs Tamsulosin refills called into [facility]

## 2016-03-25 NOTE — Telephone Encounter (Signed)
New message      *STAT* If patient is at the pharmacy, call can be transferred to refill team.   1. Which medications need to be refilled? (please list name of each medication and dose if known)  Spironolactone 25mg  2. Which pharmacy/location (including street and city if local pharmacy) is medication to be sent to? [facility] 3. Do they need a 30 day or 90 day supply?  90 day

## 2016-03-25 NOTE — Telephone Encounter (Signed)
Sent in

## 2016-03-29 ENCOUNTER — Encounter: Payer: Self-pay | Admitting: Cardiology

## 2016-04-26 ENCOUNTER — Telehealth: Payer: Self-pay | Admitting: Endocrinology

## 2016-04-26 ENCOUNTER — Other Ambulatory Visit (INDEPENDENT_AMBULATORY_CARE_PROVIDER_SITE_OTHER): Payer: Medicare Other

## 2016-04-26 DIAGNOSIS — R7989 Other specified abnormal findings of blood chemistry: Secondary | ICD-10-CM

## 2016-04-26 DIAGNOSIS — Z794 Long term (current) use of insulin: Secondary | ICD-10-CM

## 2016-04-26 DIAGNOSIS — D649 Anemia, unspecified: Secondary | ICD-10-CM

## 2016-04-26 DIAGNOSIS — E1165 Type 2 diabetes mellitus with hyperglycemia: Secondary | ICD-10-CM | POA: Diagnosis not present

## 2016-04-26 DIAGNOSIS — E785 Hyperlipidemia, unspecified: Secondary | ICD-10-CM

## 2016-04-26 LAB — COMPREHENSIVE METABOLIC PANEL
ALT: 13 U/L (ref 0–53)
AST: 15 U/L (ref 0–37)
Albumin: 3.9 g/dL (ref 3.5–5.2)
Alkaline Phosphatase: 48 U/L (ref 39–117)
BILIRUBIN TOTAL: 0.5 mg/dL (ref 0.2–1.2)
BUN: 25 mg/dL — AB (ref 6–23)
CO2: 22 meq/L (ref 19–32)
CREATININE: 1.13 mg/dL (ref 0.40–1.50)
Calcium: 9 mg/dL (ref 8.4–10.5)
Chloride: 104 mEq/L (ref 96–112)
GFR: 66.35 mL/min (ref >60.00)
GLUCOSE: 174 mg/dL — AB (ref 70–99)
Potassium: 4.5 mEq/L (ref 3.5–5.1)
Sodium: 135 mEq/L (ref 135–145)
Total Protein: 6.5 g/dL (ref 6.0–8.3)

## 2016-04-26 LAB — LIPID PANEL
CHOL/HDL RATIO: 4
CHOLESTEROL: 175 mg/dL (ref 0–200)
HDL: 49.2 mg/dL (ref >39.00)
NONHDL: 126.01
TRIGLYCERIDES: 248 mg/dL — AB (ref 0.0–149.0)
VLDL: 49.6 mg/dL — AB (ref 0.0–40.0)

## 2016-04-26 LAB — CBC
HEMATOCRIT: 33.9 % — AB (ref 39.0–52.0)
HEMOGLOBIN: 11.6 g/dL — AB (ref 13.0–17.0)
MCHC: 34.1 g/dL (ref 30.0–36.0)
MCV: 89.3 fl (ref 78.0–100.0)
PLATELETS: 174 10*3/uL (ref 150.0–400.0)
RBC: 3.8 Mil/uL — ABNORMAL LOW (ref 4.22–5.81)
RDW: 14.7 % (ref 11.5–15.5)
WBC: 7.3 10*3/uL (ref 4.0–10.5)

## 2016-04-26 LAB — HEMOGLOBIN A1C: HEMOGLOBIN A1C: 7.5 % — AB (ref 4.6–6.5)

## 2016-04-26 LAB — IBC PANEL
Iron: 84 ug/dL (ref 42–165)
Saturation Ratios: 27 % (ref 20.0–50.0)
Transferrin: 222 mg/dL (ref 212.0–360.0)

## 2016-04-26 LAB — LDL CHOLESTEROL, DIRECT: Direct LDL: 101 mg/dL

## 2016-04-26 NOTE — Telephone Encounter (Signed)
victoza needs to be called into [facility] asap please he is out

## 2016-04-29 ENCOUNTER — Encounter: Payer: Self-pay | Admitting: Endocrinology

## 2016-04-29 ENCOUNTER — Ambulatory Visit (INDEPENDENT_AMBULATORY_CARE_PROVIDER_SITE_OTHER): Payer: Medicare Other | Admitting: Endocrinology

## 2016-04-29 VITALS — BP 148/70 | HR 67 | Ht 70.0 in | Wt 232.0 lb

## 2016-04-29 DIAGNOSIS — E1165 Type 2 diabetes mellitus with hyperglycemia: Secondary | ICD-10-CM

## 2016-04-29 DIAGNOSIS — I10 Essential (primary) hypertension: Secondary | ICD-10-CM | POA: Diagnosis not present

## 2016-04-29 DIAGNOSIS — E785 Hyperlipidemia, unspecified: Secondary | ICD-10-CM

## 2016-04-29 DIAGNOSIS — R5383 Other fatigue: Secondary | ICD-10-CM | POA: Diagnosis not present

## 2016-04-29 DIAGNOSIS — D539 Nutritional anemia, unspecified: Secondary | ICD-10-CM

## 2016-04-29 DIAGNOSIS — Z794 Long term (current) use of insulin: Secondary | ICD-10-CM

## 2016-04-29 NOTE — Progress Notes (Signed)
Patient ID: Todd Romero, male   DOB: 11-Sep-1933, 82 y.o.   MRN: [id number]    Reason for Appointment:   Follow-up of chronic problems    History of Present Illness   Diagnosis: Type 2 DIABETES MELITUS, date of diagnosis:  1997   He has been on various regimens for his diabetes and has been on bedtime insulin with NPH since 2005 He also has benefited from adding Victoza in 2011 with better postprandial control and some weight loss Previously his weight has been as much as 247 pounds  On Victoza  since 11/14 with further improvement in blood sugar control.  Since about 2015 his blood sugars have been overall mildly high with A1c over 7% consistently. On this visit A1c is about the same at 7.4, previously 7.3  Non-insulin hypoglycemic drugs: Amaryl in the a.m. and metformin 1 g twice a day.  Victoza 1.8 mg daily  Insulin regimen: NPH 22 units at bedtime    Current management, blood sugar patterns and problems identified:  His fasting blood sugars are usually fairly good and just about 100 but recently higher since he had reduced his Victoza  He has sporadic high readings after lunch and supper based on his diet and eating more carbohydrate at times  He thinks he is usually not eating higher fat meals except occasionally when eating out  Recently because of not having a refill for his Victoza he has cut it back to 1.2 mg  No nocturnal hypoglycemia reported  He is trying to walk a little but limited by shortness of breath  has difficulty losing weight      Side effects from medications: None  Monitors blood glucose:about 2x a day.    Glucometer:  Accu-Chek         Blood Glucose readings from meter download:   Mean values apply above for all meters except median for One Touch  PRE-MEAL Fasting Lunch Dinner PCS  Overall  Glucose range: 101-148   112-224  124-226    Mean/median:    160  147    Physical activity: exercise: just starting at the gym  Meal  times: Dinner about 5-7 PM  Wt Readings from Last 3 Encounters:  04/29/16 232 lb (105.2 kg)  03/17/16 234 lb 9.6 oz (106.4 kg)  02/12/16 233 lb 2 oz (105.7 kg)   LABS: Lab Results  Component Value Date   HGBA1C 7.5 (H) 04/26/2016   HGBA1C 7.4 (H) 01/22/2016   HGBA1C 7.3 (H) 09/24/2015   Lab Results  Component Value Date   MICROALBUR 2.8 (H) 01/22/2016   LDLCALC 106 (H) 01/22/2016   CREATININE 1.13 04/26/2016     OTHER active problems are discussed in review of systems  Lab on 04/26/2016  Component Date Value Ref Range Status  . Hgb A1c MFr Bld 04/26/2016 7.5* 4.6 - 6.5 % Final  . Sodium 04/26/2016 135  135 - 145 mEq/L Final  . Potassium 04/26/2016 4.5  3.5 - 5.1 mEq/L Final  . Chloride 04/26/2016 104  96 - 112 mEq/L Final  . CO2 04/26/2016 22  19 - 32 mEq/L Final  . Glucose, Bld 04/26/2016 174* 70 - 99 mg/dL Final  . BUN 04/26/2016 25* 6 - 23 mg/dL Final  . Creatinine, Ser 04/26/2016 1.13  0.40 - 1.50 mg/dL Final  . Total Bilirubin 04/26/2016 0.5  0.2 - 1.2 mg/dL Final  . Alkaline Phosphatase 04/26/2016 48  39 - 117 U/L Final  . AST 04/26/2016 15  0 - 37 U/L Final  . ALT 04/26/2016 13  0 - 53 U/L Final  . Total Protein 04/26/2016 6.5  6.0 - 8.3 g/dL Final  . Albumin 04/26/2016 3.9  3.5 - 5.2 g/dL Final  . Calcium 04/26/2016 9.0  8.4 - 10.5 mg/dL Final  . GFR 04/26/2016 66.35  >60.00 mL/min Final  . Cholesterol 04/26/2016 175  0 - 200 mg/dL Final  . Triglycerides 04/26/2016 248.0* 0.0 - 149.0 mg/dL Final  . HDL 04/26/2016 49.20  >39.00 mg/dL Final  . VLDL 04/26/2016 49.6* 0.0 - 40.0 mg/dL Final  . Total CHOL/HDL Ratio 04/26/2016 4   Final  . NonHDL 04/26/2016 126.01   Final  . Iron 04/26/2016 84  42 - 165 ug/dL Final  . Transferrin 04/26/2016 222.0  212.0 - 360.0 mg/dL Final  . Saturation Ratios 04/26/2016 27.0  20.0 - 50.0 % Final  . WBC 04/26/2016 7.3  4.0 - 10.5 K/uL Final  . RBC 04/26/2016 3.80* 4.22 - 5.81 Mil/uL Final  . Platelets 04/26/2016 174.0  150.0 -  400.0 K/uL Final  . Hemoglobin 04/26/2016 11.6* 13.0 - 17.0 g/dL Final  . HCT 04/26/2016 33.9* 39.0 - 52.0 % Final  . MCV 04/26/2016 89.3  78.0 - 100.0 fl Final  . MCHC 04/26/2016 34.1  30.0 - 36.0 g/dL Final  . RDW 04/26/2016 14.7  11.5 - 15.5 % Final  . Direct LDL 04/26/2016 101.0  mg/dL Final      Medication List       Accurate as of 04/29/16 11:04 AM. Always use your most recent med list.          ACCU-CHEK AVIVA PLUS test strip Generic drug:  glucose blood USE AS INSTRUCTED TO CHECK BLOOD SUGAR TWICE A DAY (CODE 250.00)   aspirin 81 MG tablet Take 81 mg by mouth daily.   ferrous sulfate 325 (65 FE) MG tablet Take 325 mg by mouth daily with breakfast.   fish oil-omega-3 fatty acids 1000 MG capsule Take 2 g by mouth daily.   furosemide 80 MG tablet Commonly known as:  LASIX Take 0.5 tablets (40 mg total) by mouth daily.   glimepiride 2 MG tablet Commonly known as:  AMARYL Take 0.5 tablets (1 mg total) by mouth daily.   ibuprofen 200 MG tablet Commonly known as:  ADVIL,MOTRIN Take 200 mg by mouth every 6 (six) hours as needed for headache or moderate pain.   insulin NPH Human 100 UNIT/ML injection Commonly known as:  HUMULIN N,NOVOLIN N Inject 0.22 mLs (22 Units total) into the skin at bedtime. Uses Vial   Insulin Pen Needle 31G X 5 MM Misc Commonly known as:  B-D UF III MINI PEN NEEDLES Use 1 pen needle per day   Insulin Syringe-Needle U-100 31G X 5/16" 1 ML Misc Commonly known as:  BD INSULIN SYRINGE ULTRAFINE Use to inject insulin daily   Liraglutide 18 MG/3ML Sopn Commonly known as:  VICTOZA Inject 0.3 mLs (1.8 mg total) into the skin daily.   metFORMIN 1000 MG tablet Commonly known as:  GLUCOPHAGE Take 1 tablet (1,000 mg total) by mouth 2 (two) times daily with a meal.   metoprolol succinate 25 MG 24 hr tablet Commonly known as:  TOPROL-XL Take 12.5 mg by mouth daily.   multivitamin with minerals Tabs tablet Take 1 tablet by mouth daily.     olmesartan 40 MG tablet Commonly known as:  BENICAR Take 1 tablet (40 mg total) by mouth daily.   pravastatin 80 MG tablet  Commonly known as:  PRAVACHOL Take 1 tablet (80 mg total) by mouth daily.   PRESERVISION/LUTEIN Caps Take 1 capsule by mouth daily.   sodium chloride 0.65 % Soln nasal spray Commonly known as:  OCEAN Place 2 sprays into both nostrils 2 (two) times daily.   spironolactone 25 MG tablet Commonly known as:  ALDACTONE Take 1 tablet (25 mg total) by mouth daily.   tamsulosin 0.4 MG Caps capsule Commonly known as:  FLOMAX Take 1 capsule (0.4 mg total) by mouth daily.       Allergies:  Allergies  Allergen Reactions  . Morphine And Related Nausea And Vomiting    Past Medical History:  Diagnosis Date  . CAD (coronary artery disease)   . Diabetes ([place])   . Hyperlipidemia   . Obesity (BMI 30-39.9) 06/18/2015  . OSA (obstructive sleep apnea)    Severe with AHI 27/hr now on CPAP  . Pure hypercholesterolemia   . RBBB   . Type II or unspecified type diabetes mellitus without mention of complication, not stated as uncontrolled   . Unspecified essential hypertension     Past Surgical History:  Procedure Laterality Date  . ANTERIOR CRUCIATE LIGAMENT REPAIR    . CARPAL TUNNEL RELEASE    . VEIN SURGERY      Family History  Problem Relation Age of Onset  . Heart failure Father   . Emphysema Father   . Hypertension Mother   . Cancer Sister   . Healthy Sister     Social History:  reports that he has never smoked. He has never used smokeless tobacco. His alcohol and drug histories are not on file.  Review of Systems:  HYPERTENSION:  Blood pressure is Usually well controlled, tends to be higher in the office He has not checked blood pressure at home and does not think it is high   His cardiologist increased the Lasix to 80 mg because of edema  BP Readings from Last 3 Encounters:  04/29/16 (!) 148/70  03/17/16 (!) 148/76  02/12/16 140/70      HYPERLIPIDEMIA: The lipid abnormality consists of elevated LDL and he is taking 80 mg Pravachol, no side effects. LDL is improved as also HDL is  Better, did have nonfasting labs  Lab Results  Component Value Date   CHOL 175 04/26/2016   CHOL 176 01/22/2016   CHOL 184 01/13/2015   Lab Results  Component Value Date   HDL 49.20 04/26/2016   HDL 41.80 01/22/2016   HDL 58.50 01/13/2015   Lab Results  Component Value Date   LDLCALC 106 (H) 01/22/2016   LDLCALC 100 (H) 01/13/2015   LDLCALC 104 (H) 03/14/2014   Lab Results  Component Value Date   TRIG 248.0 (H) 04/26/2016   TRIG 140.0 01/22/2016   TRIG 129.0 01/13/2015   Lab Results  Component Value Date   CHOLHDL 4 04/26/2016   CHOLHDL 4 01/22/2016   CHOLHDL 3 01/13/2015   Lab Results  Component Value Date   LDLDIRECT 101.0 04/26/2016    History of daytime fatigability and somnolence if sitting still, Secondary to sleep apnea.    Has having difficulty tolerating his current CPAP treatment but is Trying to use it at least 4 hours at night intermittently  He complains of shortness of breath on exertion, this occurs with walking about a quarter of a mile   History of overactive bladder for which he takes Enablex only when he is traveling  Mild anemia: He is taking iron supplements  3 times a week Hemoglobin is slightly lower but his iron level is normal as also Hemoccult No history of B-12 deficiency    Lab Results  Component Value Date   WBC 7.3 04/26/2016   HGB 11.6 (L) 04/26/2016   HCT 33.9 (L) 04/26/2016   MCV 89.3 04/26/2016   PLT 174.0 04/26/2016   Lab Results  Component Value Date   OCCULTBLD Negative 02/13/2016   OCCULTBLD Negative 04/12/2014   Foot exam last done in 9/16  Today he has asking about loose stools for some time-usually occurring once a day without any abdominal pain or change in stool color.   ROS    Physical Exam     BP (!) 148/70   Pulse 67   Ht 5\' 10"  (1.778 m)   Wt  232 lb (105.2 kg)   SpO2 97%   BMI 33.29 kg/m   No ankle edema seen  ASSESSMENT/ PLAN:   Diabetes type 2 with obesity  See history of present illness for detailed discussion of his current management, blood sugar patterns and problems identified  The patient's diabetes control appears to be overall fair considering his age and duration of diabetes A1c is still over 7% Most likely he is getting periodic post prandial hyperglycemia causing his higher A1c He is reluctant to take any mealtime insulin in addition to his bedtime NPH He is just starting to get back into exercise as his knees are better Also encouraged him to check more readings after evening meal  For now since he is waiting on supplies for his Victoza refill he can increase his NPH to 24 units temporarily Also since he is possibly getting diarrhea from metformin will change him to be extended release preparation, discussed how this is different  ANEMIA: Likely to be from chronic disease as he has no signs of blood loss and hemoglobin is normal.  May need to check B12 also Will continue to follow  HYPERLIPIDEMIA: He has a LDL just around 100 with 80 mg pravastatin Discussed sources of saturated fat  HYPERTENSION: Blood pressure is  upper normal, better at home, tends to get anxious in the office Will also continue to have him follow with cardiologist and check blood pressures at home regularly Reassured him that Benicar is safe despite his reading about adverse problems with this  Counseling time on subjects discussed above is over 50% of today's 25 minute visit    Kelwin Gibler 04/29/2016, 11:04 AM

## 2016-04-29 NOTE — Patient Instructions (Addendum)
METFORMIN 1/2 in am  Insulin 24 till Victoza 1.8 mg daily

## 2016-05-03 ENCOUNTER — Other Ambulatory Visit: Payer: Self-pay | Admitting: Endocrinology

## 2016-05-04 ENCOUNTER — Telehealth: Payer: Self-pay | Admitting: Endocrinology

## 2016-05-04 MED ORDER — LIRAGLUTIDE 18 MG/3ML ~~LOC~~ SOPN: 1.8000 mg | PEN_INJECTOR | Freq: Every day | SUBCUTANEOUS | 2 refills | Status: DC

## 2016-05-04 MED ORDER — LIRAGLUTIDE 18 MG/3ML ~~LOC~~ SOPN: 1.8000 mg | PEN_INJECTOR | Freq: Every day | SUBCUTANEOUS | 0 refills | Status: DC

## 2016-05-04 NOTE — Telephone Encounter (Signed)
Patient wife is calling on the status of his medication he is almost out,  Asap!!!

## 2016-05-04 NOTE — Telephone Encounter (Signed)
I contacted the patients wife and verified the refill his is in need of is victoza. Prescription submitted per pt's request.

## 2016-05-13 ENCOUNTER — Other Ambulatory Visit: Payer: Self-pay | Admitting: *Deleted

## 2016-05-13 MED ORDER — INSULIN NPH (HUMAN) (ISOPHANE) 100 UNIT/ML ~~LOC~~ SUSP: 22.0000 [IU] | Freq: Every day | SUBCUTANEOUS | 1 refills | Status: DC

## 2016-05-13 NOTE — Telephone Encounter (Signed)
[facility] request.

## 2016-05-13 NOTE — Telephone Encounter (Signed)
Patient need a refill  nsulin NPH Human (HUMULIN N,NOVOLIN N) 100 UNIT/ML  [facility] - [place], [place] [phone] (Phone) [phone] (Fax)

## 2016-05-14 ENCOUNTER — Encounter (INDEPENDENT_AMBULATORY_CARE_PROVIDER_SITE_OTHER): Payer: Self-pay

## 2016-05-14 ENCOUNTER — Encounter: Payer: Self-pay | Admitting: Interventional Cardiology

## 2016-05-14 ENCOUNTER — Ambulatory Visit (INDEPENDENT_AMBULATORY_CARE_PROVIDER_SITE_OTHER): Payer: Medicare Other | Admitting: Interventional Cardiology

## 2016-05-14 VITALS — BP 160/74 | HR 59 | Ht 70.0 in | Wt 235.4 lb

## 2016-05-14 DIAGNOSIS — I10 Essential (primary) hypertension: Secondary | ICD-10-CM

## 2016-05-14 DIAGNOSIS — I2510 Atherosclerotic heart disease of native coronary artery without angina pectoris: Secondary | ICD-10-CM

## 2016-05-14 DIAGNOSIS — E785 Hyperlipidemia, unspecified: Secondary | ICD-10-CM

## 2016-05-14 DIAGNOSIS — I350 Nonrheumatic aortic (valve) stenosis: Secondary | ICD-10-CM | POA: Diagnosis not present

## 2016-05-14 DIAGNOSIS — I5032 Chronic diastolic (congestive) heart failure: Secondary | ICD-10-CM

## 2016-05-14 DIAGNOSIS — G4733 Obstructive sleep apnea (adult) (pediatric): Secondary | ICD-10-CM

## 2016-05-14 NOTE — Progress Notes (Signed)
Cardiology Office Note    Date:  05/14/2016   ID:  Todd Romero, DOB 01/08/1934, MRN [id number]  PCP:  [name], MD  Cardiologist: Sinclair Grooms, MD   Chief Complaint  Patient presents with  . Congestive Heart Failure    History of Present Illness:  Todd Romero is a 82 y.o. male aortic stenosis, chronic diastolic heart failure, obstructive sleep apnea, hypertension, type 2 diabetes, and hyperlipidemia.  This summer, due to dyspnea on exertion and lower extremity swelling, a significant diuretic regimen was instituted. With institution of diuresis, he has not had lightheadedness, dizziness, or syncope. Lower extremity edema and dyspnea evident improved. He denies angina.  Past Medical History:  Diagnosis Date  . CAD (coronary artery disease)   . Diabetes ([place])   . Hyperlipidemia   . Obesity (BMI 30-39.9) 06/18/2015  . OSA (obstructive sleep apnea)    Severe with AHI 27/hr now on CPAP  . Pure hypercholesterolemia   . RBBB   . Type II or unspecified type diabetes mellitus without mention of complication, not stated as uncontrolled   . Unspecified essential hypertension     Past Surgical History:  Procedure Laterality Date  . ANTERIOR CRUCIATE LIGAMENT REPAIR    . CARPAL TUNNEL RELEASE    . VEIN SURGERY      Current Medications: Outpatient Medications Prior to Visit  Medication Sig Dispense Refill  . ACCU-CHEK AVIVA PLUS test strip USE AS INSTRUCTED TO CHECK BLOOD SUGAR TWICE A DAY 200 each 2  . aspirin 81 MG tablet Take 81 mg by mouth daily.    . ferrous sulfate 325 (65 FE) MG tablet Take 325 mg by mouth daily with breakfast.    . fish oil-omega-3 fatty acids 1000 MG capsule Take 2 g by mouth daily.    [name] glimepiride (AMARYL) 2 MG tablet Take 0.5 tablets (1 mg total) by mouth daily. 45 tablet 1  . ibuprofen (ADVIL,MOTRIN) 200 MG tablet Take 200 mg by mouth every 6 (six) hours as needed for headache or moderate pain.     [name] insulin NPH Human (HUMULIN  N,NOVOLIN N) 100 UNIT/ML injection Inject 0.22 mLs (22 Units total) into the skin at bedtime. Uses Vial 20 mL 1  . Insulin Pen Needle (B-D UF III MINI PEN NEEDLES) 31G X 5 MM MISC Use 1 pen needle per day 100 each 2  . Insulin Syringe-Needle U-100 (BD INSULIN SYRINGE ULTRAFINE) 31G X 5/16" 1 ML MISC Use to inject insulin daily 100 each 5  . Liraglutide (VICTOZA) 18 MG/3ML SOPN Inject 0.3 mLs (1.8 mg total) into the skin daily. 9 pen 2  . metFORMIN (GLUCOPHAGE) 1000 MG tablet Take 1 tablet (1,000 mg total) by mouth 2 (two) times daily with a meal. 180 tablet 1  . metoprolol succinate (TOPROL-XL) 25 MG 24 hr tablet Take 12.5 mg by mouth daily.    . Multiple Vitamin (MULTIVITAMIN WITH MINERALS) TABS tablet Take 1 tablet by mouth daily.    . Multiple Vitamins-Minerals (PRESERVISION/LUTEIN) CAPS Take 1 capsule by mouth daily.     [name] olmesartan (BENICAR) 40 MG tablet Take 1 tablet (40 mg total) by mouth daily. 90 tablet 3  . pravastatin (PRAVACHOL) 80 MG tablet Take 1 tablet (80 mg total) by mouth daily. 90 tablet 1  . sodium chloride (OCEAN) 0.65 % SOLN nasal spray Place 2 sprays into both nostrils 2 (two) times daily.  0  . spironolactone (ALDACTONE) 25 MG tablet Take 1 tablet (25 mg total)  by mouth daily. 90 tablet 3  . tamsulosin (FLOMAX) 0.4 MG CAPS capsule Take 1 capsule (0.4 mg total) by mouth daily. 90 capsule 2  . furosemide (LASIX) 80 MG tablet Take 0.5 tablets (40 mg total) by mouth daily. (Patient not taking: Reported on 05/14/2016) 40 tablet 0   No facility-administered medications prior to visit.      Allergies:   Morphine and related   Social History   Social History  . Marital status: Married    Spouse name: N/A  . Number of children: N/A  . Years of education: N/A   Social History Main Topics  . Smoking status: Never Smoker  . Smokeless tobacco: Never Used  . Alcohol use None  . Drug use: Unknown  . Sexual activity: Not Asked   Other Topics Concern  . None   Social  History Narrative  . None     Family History:  The patient's family history includes Cancer in his sister; Emphysema in his father; Healthy in his sister; Heart failure in his father; Hypertension in his mother.   ROS:   Please see the history of present illness.    Unable to tolerate CPAP mask. He is wearing it intermittently.  All other systems reviewed and are negative.   PHYSICAL EXAM:   VS:  BP (!) 160/74   Pulse (!) 59   Ht 5\' 10"  (1.778 m)   Wt 235 lb 6.4 oz (106.8 kg)   BMI 33.78 kg/m    GEN: Well nourished, well developed, in no acute distress  HEENT: normal  Neck: no JVD, carotid bruits, or masses Cardiac: RRR; 3/6 Crescendo decrescendo murmur of aortic stenosis. There is no rub, or gallops. He does have 1+ bilateral lower extremity edema despite support stockings.  Respiratory:  clear to auscultation bilaterally, normal work of breathing GI: soft, nontender, nondistended, + BS MS: no deformity or atrophy  Skin: warm and dry, no rash Neuro:  Alert and Oriented x 3, Strength and sensation are intact Psych: euthymic mood, full affect  Wt Readings from Last 3 Encounters:  05/14/16 235 lb 6.4 oz (106.8 kg)  04/29/16 232 lb (105.2 kg)  03/17/16 234 lb 9.6 oz (106.4 kg)      Studies/Labs Reviewed:   EKG:  EKG  Not performed.  Recent Labs: 12/18/2015: Brain Natriuretic Peptide 75.7 04/26/2016: ALT 13; BUN 25; Creatinine, Ser 1.13; Hemoglobin 11.6; Platelets 174.0; Potassium 4.5; Sodium 135   Lipid Panel    Component Value Date/Time   CHOL 175 04/26/2016 0915   TRIG 248.0 (H) 04/26/2016 0915   HDL 49.20 04/26/2016 0915   CHOLHDL 4 04/26/2016 0915   VLDL 49.6 (H) 04/26/2016 0915   LDLCALC 106 (H) 01/22/2016 0923   LDLDIRECT 101.0 04/26/2016 0915    Additional studies/ records that were reviewed today include:  Echocardiogram 01/05/16: Study Conclusions  - Left ventricle: The cavity size was normal. Wall thickness was   increased in a pattern of mild LVH.  Systolic function was normal.   The estimated ejection fraction was in the range of 55% to 60%.   Wall motion was normal; there were no regional wall motion   abnormalities. Doppler parameters are consistent with abnormal   left ventricular relaxation (grade 1 diastolic dysfunction). - Aortic valve: There was moderate stenosis. Valve area (VTI): 1.45   cm^2. Valve area (Vmax): 1.37 cm^2. Valve area (Vmean): 1.33   cm^2. - Mitral valve: Mildly to moderately calcified annulus. - Left atrium: The atrium was mildly  dilated. - Right atrium: The atrium was mildly dilated.  Impressions:  - Normal LV systolic function. There is grade 1 diastolic   dysfunction.   Moderate aortic stenosis.   The aortic stenosis is unchanged from previous echo from Nov.   2015.   ASSESSMENT:    1. Aortic stenosis   2. Chronic diastolic heart failure ([place])   3. Essential hypertension, benign   4. Coronary artery disease involving native coronary artery of native heart without angina pectoris   5. Hyperlipidemia   6. Obstructive sleep apnea      PLAN:  In order of problems listed above:  1. Stable aortic stenosis. 2. No evidence of volume overload. Recent laboratory data demonstrated potassium and kidney function was stable. 3. Blood pressure is a little up this morning but he has not had furosemide yet today because he knew he was coming out and would have difficulty with urination. 4. Stable without angina. 5. On therapy. 6. Attempting to wear C Pap but having difficulty. This is being followed by Dr. Radford Pax.    Medication Adjustments/Labs and Tests Ordered: Current medicines are reviewed at length with the patient today.  Concerns regarding medicines are outlined above.  Medication changes, Labs and Tests ordered today are listed in the Patient Instructions below. There are no Patient Instructions on file for this visit.   Signed, Sinclair Grooms, MD  05/14/2016 11:41 AM    [facility] [address], [place], [place]  [address] Phone: [phone]; Fax: [phone]

## 2016-05-14 NOTE — Patient Instructions (Signed)
Your physician recommends that you continue on your current medications as directed. Please refer to the Current Medication list given to you today.  Your physician wants you to follow-up in: [place].   You will receive a reminder letter in the mail two months in advance. If you don't receive a letter, please call our office to schedule the follow-up appointment.  Your physician recommends that you return for lab work in: [place], SAME DAY AS [place].

## 2016-05-19 NOTE — Telephone Encounter (Signed)
Need refill of glimepiride (AMARYL) 2 MG tablet  Send to  [place], [place] [phone] (Phone) [phone] (Fax)

## 2016-05-24 ENCOUNTER — Other Ambulatory Visit: Payer: Self-pay | Admitting: Endocrinology

## 2016-05-29 ENCOUNTER — Other Ambulatory Visit: Payer: Self-pay | Admitting: Endocrinology

## 2016-06-08 ENCOUNTER — Other Ambulatory Visit: Payer: Self-pay

## 2016-06-08 MED ORDER — METFORMIN HCL ER (MOD) 500 MG PO TB24: 500.0000 mg | ORAL_TABLET | Freq: Four times a day (QID) | ORAL | 1 refills | Status: DC

## 2016-07-27 ENCOUNTER — Other Ambulatory Visit (INDEPENDENT_AMBULATORY_CARE_PROVIDER_SITE_OTHER): Payer: Medicare Other

## 2016-07-27 DIAGNOSIS — R5383 Other fatigue: Secondary | ICD-10-CM | POA: Diagnosis not present

## 2016-07-27 DIAGNOSIS — E1165 Type 2 diabetes mellitus with hyperglycemia: Secondary | ICD-10-CM | POA: Diagnosis not present

## 2016-07-27 DIAGNOSIS — Z794 Long term (current) use of insulin: Secondary | ICD-10-CM | POA: Diagnosis not present

## 2016-07-27 DIAGNOSIS — D539 Nutritional anemia, unspecified: Secondary | ICD-10-CM | POA: Diagnosis not present

## 2016-07-27 LAB — COMPREHENSIVE METABOLIC PANEL
ALBUMIN: 4 g/dL (ref 3.5–5.2)
ALK PHOS: 53 U/L (ref 39–117)
ALT: 13 U/L (ref 0–53)
AST: 15 U/L (ref 0–37)
BILIRUBIN TOTAL: 0.5 mg/dL (ref 0.2–1.2)
BUN: 27 mg/dL — AB (ref 6–23)
CO2: 25 mEq/L (ref 19–32)
CREATININE: 1.09 mg/dL (ref 0.40–1.50)
Calcium: 9.9 mg/dL (ref 8.4–10.5)
Chloride: 102 mEq/L (ref 96–112)
GFR: 69.12 mL/min (ref >60.00)
GLUCOSE: 117 mg/dL — AB (ref 70–99)
POTASSIUM: 4.5 meq/L (ref 3.5–5.1)
SODIUM: 136 meq/L (ref 135–145)
TOTAL PROTEIN: 6.7 g/dL (ref 6.0–8.3)

## 2016-07-27 LAB — CBC
HCT: 36.1 % — ABNORMAL LOW (ref 39.0–52.0)
Hemoglobin: 12 g/dL — ABNORMAL LOW (ref 13.0–17.0)
MCHC: 33.3 g/dL (ref 30.0–36.0)
MCV: 90.7 fl (ref 78.0–100.0)
Platelets: 191 10*3/uL (ref 150.0–400.0)
RBC: 3.98 Mil/uL — ABNORMAL LOW (ref 4.22–5.81)
RDW: 14.2 % (ref 11.5–15.5)
WBC: 7.7 10*3/uL (ref 4.0–10.5)

## 2016-07-27 LAB — VITAMIN B12: VITAMIN B 12: 459 pg/mL (ref 211–911)

## 2016-07-27 LAB — HEMOGLOBIN A1C: HEMOGLOBIN A1C: 7.6 % — AB (ref 4.6–6.5)

## 2016-07-27 LAB — TSH: TSH: 0.82 u[IU]/mL (ref 0.35–4.50)

## 2016-08-02 ENCOUNTER — Ambulatory Visit (INDEPENDENT_AMBULATORY_CARE_PROVIDER_SITE_OTHER): Payer: Medicare Other | Admitting: Endocrinology

## 2016-08-02 ENCOUNTER — Encounter: Payer: Self-pay | Admitting: Endocrinology

## 2016-08-02 VITALS — BP 140/68 | HR 58 | Ht 70.0 in | Wt 230.0 lb

## 2016-08-02 DIAGNOSIS — E782 Mixed hyperlipidemia: Secondary | ICD-10-CM

## 2016-08-02 DIAGNOSIS — I10 Essential (primary) hypertension: Secondary | ICD-10-CM | POA: Diagnosis not present

## 2016-08-02 DIAGNOSIS — E1165 Type 2 diabetes mellitus with hyperglycemia: Secondary | ICD-10-CM

## 2016-08-02 DIAGNOSIS — R0600 Dyspnea, unspecified: Secondary | ICD-10-CM

## 2016-08-02 DIAGNOSIS — D508 Other iron deficiency anemias: Secondary | ICD-10-CM

## 2016-08-02 DIAGNOSIS — Z794 Long term (current) use of insulin: Secondary | ICD-10-CM

## 2016-08-02 NOTE — Patient Instructions (Addendum)
Metformin 1 daily at dinner  Call if still having loose stools or hi sugars

## 2016-08-02 NOTE — Progress Notes (Signed)
Patient ID: Todd Romero, male   DOB: [birth year], 82 y.o.   MRN: [id number]    Reason for Appointment:   Follow-up of chronic problems    History of Present Illness    Problem 1: DIARRHEA Again he has asking about loose stools for up to 6 months His metformin was changed to extended release on the last visit This has not helped his diarrhea and he thinks it may be relatively worse. He has it is soft stools or sometimes explosive diarrhea but usually only once or twice a day, sometimes will see some whitish flakes No cramping or abdominal pain or nausea Has not had any antibiotics the last few months  Problem 2:  Diagnosis: Type 2 DIABETES MELITUS, date of diagnosis:  1997   He has been on various regimens for his diabetes and has been on bedtime insulin with NPH since 2005 He also has benefited from adding Victoza in 2011 with better postprandial control and some weight loss Previously his weight has been as much as 247 pounds  On Victoza  since 11/14 with further improvement in blood sugar control.  Since about 2015 his blood sugars have been overall mildly high with A1c over 7% consistently. On this visit A1c is about the same at 7.4, previously 7.3  Non-insulin hypoglycemic drugs: Amaryl in the a.m. and metformin ER 1 g twice a day.  Victoza 1.8 mg daily  Insulin regimen: NPH 22 units at bedtime    Current management, blood sugar patterns and problems identified:  His fasting blood sugars are generally fairly well controlled without overnight hypoglycemia with taking NPH  He does blood sugars in the afternoons at variable times, not clear which readings are after lunch but is only occasionally high and averaging about 130  Not clear if he has done any readings after evening meal as they are no readings after about 7:30 PM  Will have occasional high readings around 6-7 PM probably based on his diet  He has kept his weight about the same, trying to walk  but has limitations because of shortness of breath  No side effects from Victoza recently  Continues to take 2000 mg of metformin ER now     Side effects from medications: None  Monitors blood glucose:about 2x a day.    Glucometer:  Accu-Chek         Blood Glucose readings from meter download:   Mean values apply above for all meters except median for One Touch  PRE-MEAL Fasting Lunch Dinner Bedtime Overall  Glucose range: 91-150  107-158  99-204     Mean/median: 120    139   POST-MEAL PC Breakfast PC Lunch PC Dinner  Glucose range:  112-197  209, 217   Mean/median:      Physical activity: exercise: walking Slowly at the gym  Meal times: Dinner about 5-7 PM  Wt Readings from Last 3 Encounters:  08/02/16 230 lb (104.3 kg)  05/14/16 235 lb 6.4 oz (106.8 kg)  04/29/16 232 lb (105.2 kg)   LABS: Lab Results  Component Value Date   HGBA1C 7.6 (H) 07/27/2016   HGBA1C 7.5 (H) 04/26/2016   HGBA1C 7.4 (H) 01/22/2016   Lab Results  Component Value Date   MICROALBUR 2.8 (H) 01/22/2016   LDLCALC 106 (H) 01/22/2016   CREATININE 1.09 07/27/2016     OTHER active problems are discussed in review of systems  Lab on 07/27/2016  Component Date Value Ref Range Status  .  Hgb A1c MFr Bld 07/27/2016 7.6* 4.6 - 6.5 % Final  . Sodium 07/27/2016 136  135 - 145 mEq/L Final  . Potassium 07/27/2016 4.5  3.5 - 5.1 mEq/L Final  . Chloride 07/27/2016 102  96 - 112 mEq/L Final  . CO2 07/27/2016 25  19 - 32 mEq/L Final  . Glucose, Bld 07/27/2016 117* 70 - 99 mg/dL Final  . BUN 07/27/2016 27* 6 - 23 mg/dL Final  . Creatinine, Ser 07/27/2016 1.09  0.40 - 1.50 mg/dL Final  . Total Bilirubin 07/27/2016 0.5  0.2 - 1.2 mg/dL Final  . Alkaline Phosphatase 07/27/2016 53  39 - 117 U/L Final  . AST 07/27/2016 15  0 - 37 U/L Final  . ALT 07/27/2016 13  0 - 53 U/L Final  . Total Protein 07/27/2016 6.7  6.0 - 8.3 g/dL Final  . Albumin 07/27/2016 4.0  3.5 - 5.2 g/dL Final  . Calcium 07/27/2016 9.9  8.4  - 10.5 mg/dL Final  . GFR 07/27/2016 69.12  >60.00 mL/min Final  . TSH 07/27/2016 0.82  0.35 - 4.50 uIU/mL Final  . WBC 07/27/2016 7.7  4.0 - 10.5 K/uL Final  . RBC 07/27/2016 3.98* 4.22 - 5.81 Mil/uL Final  . Platelets 07/27/2016 191.0  150.0 - 400.0 K/uL Final  . Hemoglobin 07/27/2016 12.0* 13.0 - 17.0 g/dL Final  . HCT 07/27/2016 36.1* 39.0 - 52.0 % Final  . MCV 07/27/2016 90.7  78.0 - 100.0 fl Final  . MCHC 07/27/2016 33.3  30.0 - 36.0 g/dL Final  . RDW 07/27/2016 14.2  11.5 - 15.5 % Final  . Vitamin B-12 07/27/2016 459  211 - 911 pg/mL Final      Medication List       Accurate as of 08/02/16 12:07 PM. Always use your most recent med list.          ACCU-CHEK AVIVA PLUS test strip Generic drug:  glucose blood USE AS INSTRUCTED TO CHECK BLOOD SUGAR TWICE A DAY   aspirin 81 MG tablet Take 81 mg by mouth daily.   B-D UF III MINI PEN NEEDLES 31G X 5 MM Misc Generic drug:  Insulin Pen Needle USE 1 PEN NEEDLE PER DAY   ferrous sulfate 325 (65 FE) MG tablet Take 325 mg by mouth daily with breakfast.   fish oil-omega-3 fatty acids 1000 MG capsule Take 2 g by mouth daily.   furosemide 40 MG tablet Commonly known as:  LASIX Take 40 mg by mouth daily.   glimepiride 2 MG tablet Commonly known as:  AMARYL TAKE ONE-HALF (1/2) TABLET DAILY   ibuprofen 200 MG tablet Commonly known as:  ADVIL,MOTRIN Take 200 mg by mouth every 6 (six) hours as needed for headache or moderate pain.   insulin NPH Human 100 UNIT/ML injection Commonly known as:  HUMULIN N,NOVOLIN N Inject 0.22 mLs (22 Units total) into the skin at bedtime. Uses Vial   Insulin Syringe-Needle U-100 31G X 5/16" 1 ML Misc Commonly known as:  BD INSULIN SYRINGE ULTRAFINE Use to inject insulin daily   liraglutide 18 MG/3ML Sopn Commonly known as:  VICTOZA Inject 0.3 mLs (1.8 mg total) into the skin daily.   metFORMIN 500 MG (MOD) 24 hr tablet Commonly known as:  GLUMETZA Take 1 tablet (500 mg total) by mouth  QID.   metoprolol succinate 25 MG 24 hr tablet Commonly known as:  TOPROL-XL Take 12.5 mg by mouth daily.   multivitamin with minerals Tabs tablet Take 1 tablet by mouth daily.  olmesartan 40 MG tablet Commonly known as:  BENICAR Take 1 tablet (40 mg total) by mouth daily.   pravastatin 80 MG tablet Commonly known as:  PRAVACHOL Take 1 tablet (80 mg total) by mouth daily.   PRESERVISION/LUTEIN Caps Take 1 capsule by mouth daily.   sodium chloride 0.65 % Soln nasal spray Commonly known as:  OCEAN Place 2 sprays into both nostrils 2 (two) times daily.   spironolactone 25 MG tablet Commonly known as:  ALDACTONE Take 1 tablet (25 mg total) by mouth daily.   tamsulosin 0.4 MG Caps capsule Commonly known as:  FLOMAX Take 1 capsule (0.4 mg total) by mouth daily.       Allergies:  Allergies  Allergen Reactions  . Morphine And Related Nausea And Vomiting    Past Medical History:  Diagnosis Date  . CAD (coronary artery disease)   . Diabetes ([place])   . Hyperlipidemia   . Obesity (BMI 30-39.9) 06/18/2015  . OSA (obstructive sleep apnea)    Severe with AHI 27/hr now on CPAP  . Pure hypercholesterolemia   . RBBB   . Type II or unspecified type diabetes mellitus without mention of complication, not stated as uncontrolled   . Unspecified essential hypertension     Past Surgical History:  Procedure Laterality Date  . ANTERIOR CRUCIATE LIGAMENT REPAIR    . CARPAL TUNNEL RELEASE    . VEIN SURGERY      Family History  Problem Relation Age of Onset  . Heart failure Father   . Emphysema Father   . Hypertension Mother   . Cancer Sister   . Healthy Sister     Social History:  reports that he has never smoked. He has never used smokeless tobacco. His alcohol and drug histories are not on file.  Review of Systems:  HYPERTENSION:  Blood pressure is Usually well controlled, tends to be higher in the office He has not checked blood pressure at home   First blood  pressure reading was high again today    BP Readings from Last 3 Encounters:  08/02/16 140/68  05/14/16 (!) 160/74  04/29/16 (!) 148/70     HYPERLIPIDEMIA: The lipid abnormality consists of elevated LDL and he is taking 80 mg Pravachol, no side effects. LDL is Usually right around 100 Last labs were nonfasting and triglycerides were high  Lab Results  Component Value Date   CHOL 175 04/26/2016   CHOL 176 01/22/2016   CHOL 184 01/13/2015   Lab Results  Component Value Date   HDL 49.20 04/26/2016   HDL 41.80 01/22/2016   HDL 58.50 01/13/2015   Lab Results  Component Value Date   LDLCALC 106 (H) 01/22/2016   LDLCALC 100 (H) 01/13/2015   LDLCALC 104 (H) 03/14/2014   Lab Results  Component Value Date   TRIG 248.0 (H) 04/26/2016   TRIG 140.0 01/22/2016   TRIG 129.0 01/13/2015   Lab Results  Component Value Date   CHOLHDL 4 04/26/2016   CHOLHDL 4 01/22/2016   CHOLHDL 3 01/13/2015   Lab Results  Component Value Date   LDLDIRECT 101.0 04/26/2016    History of daytime fatigability and somnolence if sitting still, Secondary to sleep apnea.    Has having difficulty tolerating his current CPAP treatment but is Trying to use it at least 4 hours at night   He complains of shortness of breath on exertion, this occurs with walking about a quarter of a mile  Apparently has stable aortic stenosis but  normal LV function  History of overactive bladder for which he takes Enablex only when he is traveling  Mild anemia: He is taking iron supplements 3 times a week Hemoglobin is slightly better, last iron level is normal as also Hemoccult No history of B-12 deficiency despite taking metformin long-term, recently has normal level    Lab Results  Component Value Date   WBC 7.7 07/27/2016   HGB 12.0 (L) 07/27/2016   HCT 36.1 (L) 07/27/2016   MCV 90.7 07/27/2016   PLT 191.0 07/27/2016   Lab Results  Component Value Date   OCCULTBLD Negative 02/13/2016   OCCULTBLD Negative  04/12/2014   Foot exam last done in 9/16    ROS    Physical Exam   BP 140/68 (Cuff Size: Normal)   Pulse (!) 58   Ht 5\' 10"  (1.778 m)   Wt 230 lb (104.3 kg)   SpO2 98%   BMI 33.00 kg/m   No ankle edema seen  ASSESSMENT/ PLAN:   Diabetes type 2 with obesity  See history of present illness for detailed discussion of his current management, blood sugar patterns and problems identified  The patient's diabetes control appears to be overall fair considering his age and duration of diabetes A1c is usually around 7.5 Most likely his difficulty with control is related to postprandial hyperglycemia sometimes after lunch or supper Does not monitor enough readings after evening meal and this was discussed Discussed blood sugar targets However has not had consistent readings after meals to need mealtime insulin coverage  Also since he is possibly getting diarrhea from metformin will need to consider additional medications if he is not able to tolerate metformin  DIARRHEA: This is mild and chronic and will first eliminate medication side effect If he continues to have this despite reducing metformin to 1 tablet daily will check stool cultures, C. difficile and stool for WBC before referring to GI  ANEMIA: Likely to be from chronic disease as he has no signs of blood loss  Hemoglobin is stable at 12 and slightly better No B12 deficiency seen Will continue to follow  HYPERLIPIDEMIA: He has a LDL just around 100 with 80 mg pravastatin Will recheck on the next visit  HYPERTENSION: Blood pressure is  upper normal, occasionally has white coat syndrome Discussed need to check blood pressure at home regularly also Consider restarting amlodipine if getting persistently high reading  Shortness of breath on exertion: The only possible explanation is aortic stenosis, will defer management to cardiologist and pulmonologist  Counseling time on subjects discussed above is over 50% of today's  25 minute visit    [place] 08/02/2016, 12:07 PM

## 2016-08-19 ENCOUNTER — Telehealth: Payer: Self-pay | Admitting: Interventional Cardiology

## 2016-08-19 ENCOUNTER — Ambulatory Visit (INDEPENDENT_AMBULATORY_CARE_PROVIDER_SITE_OTHER): Payer: Medicare Other | Admitting: *Deleted

## 2016-08-19 DIAGNOSIS — R001 Bradycardia, unspecified: Secondary | ICD-10-CM | POA: Diagnosis not present

## 2016-08-19 NOTE — Telephone Encounter (Signed)
New message      On exam, nurse found patient's heart rate to be 30. Pt states he occasional has sob and fatigue but no other symptoms. Please advise

## 2016-08-19 NOTE — Telephone Encounter (Signed)
Received incoming call. Spoke with Shelton Silvas, NP with [employer]. NP stated pt's Hr with pulse ox was 31, but radial was 40. BP 142/82. Pt asymptomatic. Dr. Tamala Julian is not in the office. Will forward to Dr. Rayann Heman to advise.   Dr. Rayann Heman advised pt to hold metoprolol and f/u with Dr. Tamala Julian. Made appt for pt on 08/23/16 arriving at 3:45 PM. Informed if pt begins having symptoms before appt, to call our office, or report to ED. Pt verbalized understanding and agreed with plan.

## 2016-08-19 NOTE — Progress Notes (Signed)
1.) Reason for visit: EKG    2.) Name of MD requesting visit: Dr. Tamala Julian   3.) H&P: Pt seen by [facility] nurse at home today, used pulse ox and counted radial pulse at 30s-40s  4.) ROS related to problem: EKG shows sinus bradycardia with 1st degree AV block and blocked PACs.  Ventricular rate is 57 bpm  5.) Assessment and plan per MD: Reviewed with Nell Range, APP, who reviewed with Dr. Rayann Heman (DOD).   Pt will stop Metoprolol and follow up with Joellen Jersey in January.  No other changes for now.  Instructed to call back to be seen urgently if he develops weakness, fatigue, syncope.  Verbalizes understanding.

## 2016-08-19 NOTE — Telephone Encounter (Signed)
Called patient and spoke with spouse.   They will come over today (now) for pt to have EKG.  Discussed with Nell Range, APP, will add to her schedule today if needed after review of EKG.

## 2016-08-19 NOTE — Telephone Encounter (Signed)
If the heart rate is slow, however low for him to come in for an electrocardiogram now.

## 2016-08-20 ENCOUNTER — Telehealth: Payer: Self-pay | Admitting: Endocrinology

## 2016-08-20 NOTE — Telephone Encounter (Signed)
Pt calling to speak with Lattie Haw regarding the metformin, he is letting us know that now that his bowel movements have improved he is going to move up to 2 metformins and see how that goes.

## 2016-08-23 ENCOUNTER — Ambulatory Visit: Payer: Medicare Other | Admitting: Interventional Cardiology

## 2016-09-10 ENCOUNTER — Other Ambulatory Visit: Payer: Self-pay | Admitting: Endocrinology

## 2016-09-15 NOTE — Progress Notes (Signed)
Cardiology Office Note    Date:  09/16/2016   ID:  [name], DOB Jan 30, 1934, MRN [id number]  PCP:  [name], MD  Cardiologist:  Dr. Tamala Julian / Dr. Radford Pax (OSA)  CC: follow up  History of Present Illness:  Todd Romero is a 83 y.o. male with a history of mod aortic stenosis, chronic diastolic heart failure, CAD with CTO of D1 and 90% LCX (cath 2013), OSA on CPAP, HTN, type 2 diabetes, and hyperlipidemia who presents to clinic for follow up.   He had a cath in 2013 after an abnormal nuclear stress test showing possible anterior wall ischemia. This showed total occlusion of D1 and 90% occlusion of LCx PDA, non dominant diffusely diseased RCA and low normal LV function.   He was seen by Home health RN on 08/19/16 who noted his radial pulse to be 30-40s and office was called. He came in for an ECG which showed sinus bradycardia HR 57 with 1st deg AV block and PACs (personally reviewed by Dr. Rayann Heman and myself). He was feeling quite well and was told to stop his Metoprolol.   Today he presents to clinic for follow up. He has been fighting cough and cold for past 1-2 weeks and taking OTC robitussin max. Otherwise feeling okay. He has been feeling more SOB for a while and feeling very fatigued. No LE edema, orthopnea or PND. He is feeling like he can't get good sleep because of the CPAP mask. When he goes to [place] to visit his son and stops wearing the CPAP and feels better in terms of fatigue. SOB remains constant and he cannot walk to the mailbox without significant dyspnea. He has also felt dizziness from time to time but no syncope. No exertional chest pain or dizziness. No blood in stool or urine. No palpitations.     Past Medical History:  Diagnosis Date  . CAD (coronary artery disease)   . Diabetes ([place])   . Hyperlipidemia   . Obesity (BMI 30-39.9) 06/18/2015  . OSA (obstructive sleep apnea)    Severe with AHI 27/hr now on CPAP  . Pure hypercholesterolemia   . RBBB    . Type II or unspecified type diabetes mellitus without mention of complication, not stated as uncontrolled   . Unspecified essential hypertension     Past Surgical History:  Procedure Laterality Date  . ANTERIOR CRUCIATE LIGAMENT REPAIR    . CARPAL TUNNEL RELEASE    . VEIN SURGERY      Current Medications: Outpatient Medications Prior to Visit  Medication Sig Dispense Refill  . ACCU-CHEK AVIVA PLUS test strip USE AS INSTRUCTED TO CHECK BLOOD SUGAR TWICE A DAY 200 each 2  . aspirin 81 MG tablet Take 81 mg by mouth daily.    . B-D UF III MINI PEN NEEDLES 31G X 5 MM MISC USE 1 PEN NEEDLE PER DAY 100 each 3  . ferrous sulfate 325 (65 FE) MG tablet Take 325 mg by mouth daily with breakfast.    . fish oil-omega-3 fatty acids 1000 MG capsule Take 2 g by mouth daily.    . furosemide (LASIX) 40 MG tablet Take 40 mg by mouth daily.    [name] glimepiride (AMARYL) 2 MG tablet TAKE ONE-HALF (1/2) TABLET DAILY 45 tablet 1  . ibuprofen (ADVIL,MOTRIN) 200 MG tablet Take 200 mg by mouth every 6 (six) hours as needed for headache or moderate pain.     [name] insulin NPH Human (HUMULIN N,NOVOLIN N) 100 UNIT/ML  injection Inject 0.22 mLs (22 Units total) into the skin at bedtime. Uses Vial 20 mL 1  . Insulin Syringe-Needle U-100 (BD INSULIN SYRINGE ULTRAFINE) 31G X 5/16" 1 ML MISC Use to inject insulin daily 100 each 5  . Liraglutide (VICTOZA) 18 MG/3ML SOPN Inject 0.3 mLs (1.8 mg total) into the skin daily. 9 pen 2  . metFORMIN (GLUMETZA) 500 MG (MOD) 24 hr tablet Take 1 tablet (500 mg total) by mouth QID. 400 tablet 1  . Multiple Vitamin (MULTIVITAMIN WITH MINERALS) TABS tablet Take 1 tablet by mouth daily.    . Multiple Vitamins-Minerals (PRESERVISION/LUTEIN) CAPS Take 1 capsule by mouth daily.     [name] olmesartan (BENICAR) 40 MG tablet Take 1 tablet (40 mg total) by mouth daily. 90 tablet 3  . pravastatin (PRAVACHOL) 80 MG tablet TAKE 1 TABLET DAILY (DOSE INCREASE) 90 tablet 1  . sodium chloride (OCEAN) 0.65 %  SOLN nasal spray Place 2 sprays into both nostrils 2 (two) times daily.  0  . spironolactone (ALDACTONE) 25 MG tablet Take 1 tablet (25 mg total) by mouth daily. 90 tablet 3  . tamsulosin (FLOMAX) 0.4 MG CAPS capsule Take 1 capsule (0.4 mg total) by mouth daily. 90 capsule 2   No facility-administered medications prior to visit.      Allergies:   Morphine and related   Social History   Social History  . Marital status: Married    Spouse name: N/A  . Number of children: N/A  . Years of education: N/A   Social History Main Topics  . Smoking status: Never Smoker  . Smokeless tobacco: Never Used  . Alcohol use None  . Drug use: Unknown  . Sexual activity: Not Asked   Other Topics Concern  . None   Social History Narrative  . None     Family History:  The patient's family history includes Cancer in his sister; Emphysema in his father; Healthy in his sister; Heart failure in his father; Hypertension in his mother.     ROS:   Please see the history of present illness.    ROS All other systems reviewed and are negative.   PHYSICAL EXAM:   VS:  BP (!) 178/80   Pulse 75   Ht 5\' 10"  (1.778 m)   Wt 233 lb 12.8 oz (106.1 kg)   BMI 33.55 kg/m    GEN: Well nourished, well developed, in no acute distress, obese HEENT: normal  Neck: no JVD, carotid bruits, or masses Cardiac: RRR; +SEM @ RUSB, rubs, or gallops,no edema  Respiratory:  clear to auscultation bilaterally, normal work of breathing GI: soft, nontender, nondistended, + BS MS: no deformity or atrophy  Skin: warm and dry, no rash Neuro:  Alert and Oriented x 3, Strength and sensation are intact Psych: euthymic mood, full affect    Wt Readings from Last 3 Encounters:  09/16/16 233 lb 12.8 oz (106.1 kg)  08/02/16 230 lb (104.3 kg)  05/14/16 235 lb 6.4 oz (106.8 kg)      Studies/Labs Reviewed:   EKG:  EKG is ordered today.  The ekg ordered today demonstrates sinus with 1st deg AV block. RBBB, HR 75 and  PVCs  Recent Labs: 12/18/2015: Brain Natriuretic Peptide 75.7 07/27/2016: ALT 13; BUN 27; Creatinine, Ser 1.09; Hemoglobin 12.0; Platelets 191.0; Potassium 4.5; Sodium 136; TSH 0.82   Lipid Panel    Component Value Date/Time   CHOL 175 04/26/2016 0915   TRIG 248.0 (H) 04/26/2016 0915   HDL 49.20  04/26/2016 0915   CHOLHDL 4 04/26/2016 0915   VLDL 49.6 (H) 04/26/2016 0915   LDLCALC 106 (H) 01/22/2016 0923   LDLDIRECT 101.0 04/26/2016 0915    Additional studies/ records that were reviewed today include:   2013 IMPRESSIONS:  1. Total occlusion of the first diagonal. 90% stenosis of the left circumflex PDA. 2. Nondominant diffusely diseased right coronary 3. Low normal left ventricular function with EF of 50% and mild anterior wall hypokinesis. RECOMMENDATION:  We will discuss with the patient. Likely management strategy will be medical therapy. The distal circumflex/PDA could be intervened upon although the patient is asymptomatic and no obvious ischemia was documented on nuclear scintigraphy..    Echocardiogram 01/05/16: - Left ventricle: The cavity size was normal. Wall thickness was increased in a pattern of mild LVH. Systolic function was normal. The estimated ejection fraction was in the range of 55% to 60%. Wall motion was normal; there were no regional wall motion abnormalities. Doppler parameters are consistent with abnormal left ventricular relaxation (grade 1 diastolic dysfunction). - Aortic valve: There was moderate stenosis. Valve area (VTI): 1.45 cm^2. Valve area (Vmax): 1.37 cm^2. Valve area (Vmean): 1.33 cm^2. - Mitral valve: Mildly to moderately calcified annulus. - Left atrium: The atrium was mildly dilated. - Right atrium: The atrium was mildly dilated. Impressions: - Normal LV systolic function. There is grade 1 diastolic dysfunction. Moderate aortic stenosis. The aortic stenosis is unchanged from previous echo from  Nov. 2015.   ASSESSMENT & PLAN:   DOE: discussed case with Dr. Tamala Julian. He appears euvolemic. Will get BNP to rule out occult CHF. Will get 2D ECHO given history of mod AS and 48 Holter to rule out choronotopic incompetence. He has been advised to try to exercise to get his HR while wearing the monitor.   Bradycardia: resolved off BB. HR 75 today.   Hypertensive heart disease with heart failure: BP is elevated today at 178/80. Current BP meds include: lasix 40mg  daily, Benicar 40mg  daily, spiro 25mg  daily. Metoprolol discontinued last month 2/2 bradycardia. Will start amlodipine 5 mg daily and he will keep a log of his BPs and call us if it remains >140/90.   CAD: continue ASA, statin. No BB 2/2 bradycardia.   Chronic diastolic CHF: appears euvolemic. Continue lasix 40mg  dialy and check BNP as above  Mod AS: last echo 01/2016. With worsening DOE will update 2D ECHO .   OSA: having trouble with his CPAP mask. Dr. Radford Pax follows this. She did pop in room real fast today and told him to turn up humidity. He will call back if this does not help.   DMT2: continue current regimen   HLD: continue statin    Medication Adjustments/Labs and Tests Ordered: Current medicines are reviewed at length with the patient today.  Concerns regarding medicines are outlined above.  Medication changes, Labs and Tests ordered today are listed in the Patient Instructions below. Patient Instructions  Medication Instructions:  Your physician has recommended you make the following change in your medication:  1.  START Amlodipine 5 mg taking 1 tablet daily  Labwork: TODAY:  BNP  Testing/Procedures: Your physician has requested that you have an echocardiogram. Echocardiography is a painless test that uses sound waves to create images of your heart. It provides your doctor with information about the size and shape of your heart and how well your heart's chambers and valves are working. This procedure takes  approximately one hour. There are no restrictions for this procedure.  Your  physician has recommended that you wear a holter monitor. Holter monitors are medical devices that record the heart's electrical activity. Doctors most often use these monitors to diagnose arrhythmias. Arrhythmias are problems with the speed or rhythm of the heartbeat. The monitor is a small, portable device. You can wear one while you do your normal daily activities. This is usually used to diagnose what is causing palpitations/syncope (passing out).  Follow-Up: Your physician recommends that you schedule a follow-up appointment in: [place], PA-C   Any Other Special Instructions Will Be Listed Below (If Applicable).  Echocardiogram An echocardiogram, or echocardiography, uses sound waves (ultrasound) to produce an image of your heart. The echocardiogram is simple, painless, obtained within a short period of time, and offers valuable information to your health care provider. The images from an echocardiogram can provide information such as:  Evidence of coronary artery disease (CAD).  Heart size.  Heart muscle function.  Heart valve function.  Aneurysm detection.  Evidence of a past heart attack.  Fluid buildup around the heart.  Heart muscle thickening.  Assess heart valve function. Tell a health care provider about:  Any allergies you have.  All medicines you are taking, including vitamins, herbs, eye drops, creams, and over-the-counter medicines.  Any problems you or family members have had with anesthetic medicines.  Any blood disorders you have.  Any surgeries you have had.  Any medical conditions you have.  Whether you are pregnant or may be pregnant. What happens before the procedure? No special preparation is needed. Eat and drink normally. What happens during the procedure?  In order to produce an image of your heart, gel will be applied to your chest and a  wand-like tool (transducer) will be moved over your chest. The gel will help transmit the sound waves from the transducer. The sound waves will harmlessly bounce off your heart to allow the heart images to be captured in real-time motion. These images will then be recorded.  You may need an IV to receive a medicine that improves the quality of the pictures. What happens after the procedure? You may return to your normal schedule including diet, activities, and medicines, unless your health care provider tells you otherwise. This information is not intended to replace advice given to you by your health care provider. Make sure you discuss any questions you have with your health care provider. Document Released: 08/20/2000 Document Revised: 04/10/2016 Document Reviewed: 04/30/2013 Elsevier Interactive Patient Education  2017 [employer].   Cardiac Event Monitoring A cardiac event monitor is a small recording device used to help detect abnormal heart rhythms (arrhythmias). The monitor is used to record heart rhythm when noticeable symptoms such as the following occur:  Fast heartbeats (palpitations), such as heart racing or fluttering.  Dizziness.  Fainting or light-headedness.  Unexplained weakness. The monitor is wired to two electrodes placed on your chest. Electrodes are flat, sticky disks that attach to your skin. The monitor can be worn for up to 30 days. You will wear the monitor at all times, except when bathing. How to use your cardiac event monitor A technician will prepare your chest for the electrode placement. The technician will show you how to place the electrodes, how to work the monitor, and how to replace the batteries. Take time to practice using the monitor before you leave the office. Make sure you understand how to send the information from the monitor to your health care provider. This requires a telephone with  a landline, not a cell phone. You need to:  Wear your  monitor at all times, except when you are in water:  Do not get the monitor wet.  Take the monitor off when bathing. Do not swim or use a hot tub with it on.  Keep your skin clean. Do not put body lotion or moisturizer on your chest.  Change the electrodes daily or any time they stop sticking to your skin. You might need to use tape to keep them on.  It is possible that your skin under the electrodes could become irritated. To keep this from happening, try to put the electrodes in slightly different places on your chest. However, they must remain in the area under your left breast and in the upper right section of your chest.  Make sure the monitor is safely clipped to your clothing or in a location close to your body that your health care provider recommends.  Press the button to record when you feel symptoms of heart trouble, such as dizziness, weakness, light-headedness, palpitations, thumping, shortness of breath, unexplained weakness, or a fluttering or racing heart. The monitor is always on and records what happened slightly before you pressed the button, so do not worry about being too late to get good information.  Keep a diary of your activities, such as walking, doing chores, and taking medicine. It is especially important to note what you were doing when you pushed the button to record your symptoms. This will help your health care provider determine what might be contributing to your symptoms. The information stored in your monitor will be reviewed by your health care provider alongside your diary entries.  Send the recorded information as recommended by your health care provider. It is important to understand that it will take some time for your health care provider to process the results.  Change the batteries as recommended by your health care provider. Get help right away if:  You have chest pain.  You have extreme difficulty breathing or shortness of breath.  You develop a  very fast heartbeat that persists.  You develop dizziness that does not go away.  You faint or constantly feel you are about to faint. This information is not intended to replace advice given to you by your health care provider. Make sure you discuss any questions you have with your health care provider. Document Released: 06/01/2008 Document Revised: 01/29/2016 Document Reviewed: 02/19/2013 Elsevier Interactive Patient Education  2017 [place], Angelena Form, [place]  09/16/2016 12:13 PM    [facility] [place], [place], [place]  [address] Phone: [phone]; Fax: [phone]

## 2016-09-16 ENCOUNTER — Ambulatory Visit (INDEPENDENT_AMBULATORY_CARE_PROVIDER_SITE_OTHER): Payer: Medicare Other

## 2016-09-16 ENCOUNTER — Encounter: Payer: Self-pay | Admitting: Physician Assistant

## 2016-09-16 ENCOUNTER — Ambulatory Visit (INDEPENDENT_AMBULATORY_CARE_PROVIDER_SITE_OTHER): Payer: Medicare Other | Admitting: Physician Assistant

## 2016-09-16 VITALS — BP 178/80 | HR 75 | Ht 70.0 in | Wt 233.8 lb

## 2016-09-16 DIAGNOSIS — I25119 Atherosclerotic heart disease of native coronary artery with unspecified angina pectoris: Secondary | ICD-10-CM

## 2016-09-16 DIAGNOSIS — E118 Type 2 diabetes mellitus with unspecified complications: Secondary | ICD-10-CM

## 2016-09-16 DIAGNOSIS — I4510 Unspecified right bundle-branch block: Secondary | ICD-10-CM

## 2016-09-16 DIAGNOSIS — I5032 Chronic diastolic (congestive) heart failure: Secondary | ICD-10-CM

## 2016-09-16 DIAGNOSIS — R001 Bradycardia, unspecified: Secondary | ICD-10-CM

## 2016-09-16 DIAGNOSIS — I110 Hypertensive heart disease with heart failure: Secondary | ICD-10-CM

## 2016-09-16 DIAGNOSIS — Z9989 Dependence on other enabling machines and devices: Secondary | ICD-10-CM

## 2016-09-16 DIAGNOSIS — I10 Essential (primary) hypertension: Secondary | ICD-10-CM | POA: Diagnosis not present

## 2016-09-16 DIAGNOSIS — R0602 Shortness of breath: Secondary | ICD-10-CM

## 2016-09-16 DIAGNOSIS — R0609 Other forms of dyspnea: Secondary | ICD-10-CM | POA: Diagnosis not present

## 2016-09-16 DIAGNOSIS — G4733 Obstructive sleep apnea (adult) (pediatric): Secondary | ICD-10-CM

## 2016-09-16 DIAGNOSIS — I350 Nonrheumatic aortic (valve) stenosis: Secondary | ICD-10-CM

## 2016-09-16 MED ORDER — AMLODIPINE BESYLATE 5 MG PO TABS: 5.0000 mg | ORAL_TABLET | Freq: Every day | ORAL | 3 refills | Status: DC

## 2016-09-16 NOTE — Patient Instructions (Addendum)
Medication Instructions:  Your physician has recommended you make the following change in your medication:  1.  START Amlodipine 5 mg taking 1 tablet daily  Labwork: TODAY:  BNP  Testing/Procedures: Your physician has requested that you have an echocardiogram. Echocardiography is a painless test that uses sound waves to create images of your heart. It provides your doctor with information about the size and shape of your heart and how well your heart's chambers and valves are working. This procedure takes approximately one hour. There are no restrictions for this procedure.  Your physician has recommended that you wear a holter monitor. Holter monitors are medical devices that record the heart's electrical activity. Doctors most often use these monitors to diagnose arrhythmias. Arrhythmias are problems with the speed or rhythm of the heartbeat. The monitor is a small, portable device. You can wear one while you do your normal daily activities. This is usually used to diagnose what is causing palpitations/syncope (passing out).  Follow-Up: Your physician recommends that you schedule a follow-up appointment in: [place], PA-C   Any Other Special Instructions Will Be Listed Below (If Applicable).  Echocardiogram An echocardiogram, or echocardiography, uses sound waves (ultrasound) to produce an image of your heart. The echocardiogram is simple, painless, obtained within a short period of time, and offers valuable information to your health care provider. The images from an echocardiogram can provide information such as:  Evidence of coronary artery disease (CAD).  Heart size.  Heart muscle function.  Heart valve function.  Aneurysm detection.  Evidence of a past heart attack.  Fluid buildup around the heart.  Heart muscle thickening.  Assess heart valve function. Tell a health care provider about:  Any allergies you have.  All medicines you are taking,  including vitamins, herbs, eye drops, creams, and over-the-counter medicines.  Any problems you or family members have had with anesthetic medicines.  Any blood disorders you have.  Any surgeries you have had.  Any medical conditions you have.  Whether you are pregnant or may be pregnant. What happens before the procedure? No special preparation is needed. Eat and drink normally. What happens during the procedure?  In order to produce an image of your heart, gel will be applied to your chest and a wand-like tool (transducer) will be moved over your chest. The gel will help transmit the sound waves from the transducer. The sound waves will harmlessly bounce off your heart to allow the heart images to be captured in real-time motion. These images will then be recorded.  You may need an IV to receive a medicine that improves the quality of the pictures. What happens after the procedure? You may return to your normal schedule including diet, activities, and medicines, unless your health care provider tells you otherwise. This information is not intended to replace advice given to you by your health care provider. Make sure you discuss any questions you have with your health care provider. Document Released: 08/20/2000 Document Revised: 04/10/2016 Document Reviewed: 04/30/2013 Elsevier Interactive Patient Education  2017 [employer].   Cardiac Event Monitoring A cardiac event monitor is a small recording device used to help detect abnormal heart rhythms (arrhythmias). The monitor is used to record heart rhythm when noticeable symptoms such as the following occur:  Fast heartbeats (palpitations), such as heart racing or fluttering.  Dizziness.  Fainting or light-headedness.  Unexplained weakness. The monitor is wired to two electrodes placed on your chest. Electrodes are flat, sticky disks that attach  to your skin. The monitor can be worn for up to 30 days. You will wear the monitor at  all times, except when bathing. How to use your cardiac event monitor A technician will prepare your chest for the electrode placement. The technician will show you how to place the electrodes, how to work the monitor, and how to replace the batteries. Take time to practice using the monitor before you leave the office. Make sure you understand how to send the information from the monitor to your health care provider. This requires a telephone with a landline, not a cell phone. You need to:  Wear your monitor at all times, except when you are in water:  Do not get the monitor wet.  Take the monitor off when bathing. Do not swim or use a hot tub with it on.  Keep your skin clean. Do not put body lotion or moisturizer on your chest.  Change the electrodes daily or any time they stop sticking to your skin. You might need to use tape to keep them on.  It is possible that your skin under the electrodes could become irritated. To keep this from happening, try to put the electrodes in slightly different places on your chest. However, they must remain in the area under your left breast and in the upper right section of your chest.  Make sure the monitor is safely clipped to your clothing or in a location close to your body that your health care provider recommends.  Press the button to record when you feel symptoms of heart trouble, such as dizziness, weakness, light-headedness, palpitations, thumping, shortness of breath, unexplained weakness, or a fluttering or racing heart. The monitor is always on and records what happened slightly before you pressed the button, so do not worry about being too late to get good information.  Keep a diary of your activities, such as walking, doing chores, and taking medicine. It is especially important to note what you were doing when you pushed the button to record your symptoms. This will help your health care provider determine what might be contributing to your  symptoms. The information stored in your monitor will be reviewed by your health care provider alongside your diary entries.  Send the recorded information as recommended by your health care provider. It is important to understand that it will take some time for your health care provider to process the results.  Change the batteries as recommended by your health care provider. Get help right away if:  You have chest pain.  You have extreme difficulty breathing or shortness of breath.  You develop a very fast heartbeat that persists.  You develop dizziness that does not go away.  You faint or constantly feel you are about to faint. This information is not intended to replace advice given to you by your health care provider. Make sure you discuss any questions you have with your health care provider. Document Released: 06/01/2008 Document Revised: 01/29/2016 Document Reviewed: 02/19/2013 Elsevier Interactive Patient Education  2017 [employer].

## 2016-09-17 LAB — PRO B NATRIURETIC PEPTIDE

## 2016-10-01 ENCOUNTER — Encounter (HOSPITAL_COMMUNITY): Payer: Self-pay | Admitting: *Deleted

## 2016-10-01 ENCOUNTER — Ambulatory Visit (HOSPITAL_COMMUNITY): Payer: Medicare Other | Attending: Cardiovascular Disease

## 2016-10-01 ENCOUNTER — Other Ambulatory Visit: Payer: Self-pay

## 2016-10-01 DIAGNOSIS — R0602 Shortness of breath: Secondary | ICD-10-CM | POA: Insufficient documentation

## 2016-10-01 DIAGNOSIS — I350 Nonrheumatic aortic (valve) stenosis: Secondary | ICD-10-CM | POA: Insufficient documentation

## 2016-10-01 DIAGNOSIS — E119 Type 2 diabetes mellitus without complications: Secondary | ICD-10-CM | POA: Diagnosis not present

## 2016-10-01 DIAGNOSIS — E785 Hyperlipidemia, unspecified: Secondary | ICD-10-CM | POA: Diagnosis not present

## 2016-10-01 DIAGNOSIS — I4510 Unspecified right bundle-branch block: Secondary | ICD-10-CM | POA: Diagnosis not present

## 2016-10-01 DIAGNOSIS — E669 Obesity, unspecified: Secondary | ICD-10-CM | POA: Diagnosis not present

## 2016-10-01 DIAGNOSIS — I2510 Atherosclerotic heart disease of native coronary artery without angina pectoris: Secondary | ICD-10-CM | POA: Diagnosis not present

## 2016-10-01 DIAGNOSIS — G4733 Obstructive sleep apnea (adult) (pediatric): Secondary | ICD-10-CM | POA: Insufficient documentation

## 2016-10-01 DIAGNOSIS — I10 Essential (primary) hypertension: Secondary | ICD-10-CM | POA: Insufficient documentation

## 2016-10-01 DIAGNOSIS — Z8249 Family history of ischemic heart disease and other diseases of the circulatory system: Secondary | ICD-10-CM | POA: Diagnosis not present

## 2016-10-01 DIAGNOSIS — Z6833 Body mass index (BMI) 33.0-33.9, adult: Secondary | ICD-10-CM | POA: Diagnosis not present

## 2016-10-01 NOTE — Progress Notes (Unsigned)
IV access for use of Definity Contrast attempted (x2), unable to gain access.  Please schedule limited echo with contrast using nursing assistance (not on Friday) if indicated.    Thank you, Deliah Boston, RDCS

## 2016-10-12 ENCOUNTER — Ambulatory Visit: Payer: Medicare Other | Admitting: Physician Assistant

## 2016-10-18 NOTE — Progress Notes (Signed)
Cardiology Office Note    Date:  10/21/2016   ID:  [name], DOB 1934/03/15, MRN [id number]  PCP:  [name], MD  Cardiologist:  Dr. Tamala Julian / Dr. Radford Pax (OSA)  CC: follow up  History of Present Illness:  Todd Romero is a 83 y.o. male with a history of mod aortic stenosis, chronic diastolic heart failure, CAD with CTO of D1 and 90% LCX (cath 2013), OSA on CPAP, HTN, type 2 diabetes, and hyperlipidemia who presents to clinic for follow up.   He had a cath in 2013 after an abnormal nuclear stress test showing possible anterior wall ischemia. This showed total occlusion of D1 and 90% occlusion of LCx PDA, non dominant diffusely diseased RCA and low normal LV function. Treated with medical therapy.  He was seen by Home health RN on 08/19/16 who noted his radial pulse to be 30-40s and office was called. He came in for an ECG which showed sinus bradycardia HR 57 with 1st deg AV block and PACs (personally reviewed by Dr. Rayann Heman and myself). He was feeling quite well and was told to stop his Metoprolol.    I saw him in clinic on 09/16/16 for follow up. He was feeling more SOB and fatigued. He felt like fatigue improved when he wasn't wearing CPAP. He also complained of significant DOE. I discussed case with Dr. Tamala Julian who recommended placing a 48 hour holter with exercise to assess for chronotropic incompetence. This showed 3% PVCs and NSR with HR between 34-124 bpm. BNP was also ordered but never processed correctly. 2D ECHO showed normal LV function with mild LVH, G1DD and mod AS. His BP was also elevated at this visit and I added amlodipine 5mg  daily.   Today he presents to clinic for follow up. No CP. Still with SOB with exertion. Mild LE edema (right chronically more than left), No orthopnea or PND. No dizziness or syncope. No blood in stool or urine. No palpitations. Still not tolerating CPAP. Feels much better without it because he cannot sleep.    Past Medical History:    Diagnosis Date  . CAD (coronary artery disease)   . Diabetes ([place])   . Hyperlipidemia   . Obesity (BMI 30-39.9) 06/18/2015  . OSA (obstructive sleep apnea)    Severe with AHI 27/hr now on CPAP  . Pure hypercholesterolemia   . RBBB   . Type II or unspecified type diabetes mellitus without mention of complication, not stated as uncontrolled   . Unspecified essential hypertension     Past Surgical History:  Procedure Laterality Date  . ANTERIOR CRUCIATE LIGAMENT REPAIR    . CARPAL TUNNEL RELEASE    . VEIN SURGERY      Current Medications: Outpatient Medications Prior to Visit  Medication Sig Dispense Refill  . ACCU-CHEK AVIVA PLUS test strip USE AS INSTRUCTED TO CHECK BLOOD SUGAR TWICE A DAY 200 each 2  . aspirin 81 MG tablet Take 81 mg by mouth daily.    . B-D UF III MINI PEN NEEDLES 31G X 5 MM MISC USE 1 PEN NEEDLE PER DAY 100 each 3  . ferrous sulfate 325 (65 FE) MG tablet Take 325 mg by mouth daily with breakfast.    . fish oil-omega-3 fatty acids 1000 MG capsule Take 2 g by mouth daily.    . furosemide (LASIX) 40 MG tablet Take 40 mg by mouth daily.    [name] glimepiride (AMARYL) 2 MG tablet TAKE ONE-HALF (1/2) TABLET DAILY 45 tablet  1  . ibuprofen (ADVIL,MOTRIN) 200 MG tablet Take 200 mg by mouth every 6 (six) hours as needed for headache or moderate pain.     [name] insulin NPH Human (HUMULIN N,NOVOLIN N) 100 UNIT/ML injection Inject 0.22 mLs (22 Units total) into the skin at bedtime. Uses Vial 20 mL 1  . Insulin Syringe-Needle U-100 (BD INSULIN SYRINGE ULTRAFINE) 31G X 5/16" 1 ML MISC Use to inject insulin daily 100 each 5  . Liraglutide (VICTOZA) 18 MG/3ML SOPN Inject 0.3 mLs (1.8 mg total) into the skin daily. 9 pen 2  . metFORMIN (GLUMETZA) 500 MG (MOD) 24 hr tablet Take 1 tablet (500 mg total) by mouth QID. 400 tablet 1  . Multiple Vitamin (MULTIVITAMIN WITH MINERALS) TABS tablet Take 1 tablet by mouth daily.    . Multiple Vitamins-Minerals (PRESERVISION/LUTEIN) CAPS Take 1  capsule by mouth daily.     [name] olmesartan (BENICAR) 40 MG tablet Take 1 tablet (40 mg total) by mouth daily. 90 tablet 3  . pravastatin (PRAVACHOL) 80 MG tablet TAKE 1 TABLET DAILY (DOSE INCREASE) 90 tablet 1  . sodium chloride (OCEAN) 0.65 % SOLN nasal spray Place 2 sprays into both nostrils 2 (two) times daily.  0  . spironolactone (ALDACTONE) 25 MG tablet Take 1 tablet (25 mg total) by mouth daily. 90 tablet 3  . tamsulosin (FLOMAX) 0.4 MG CAPS capsule Take 1 capsule (0.4 mg total) by mouth daily. 90 capsule 2  . amLODipine (NORVASC) 5 MG tablet Take 1 tablet (5 mg total) by mouth daily. 90 tablet 3   No facility-administered medications prior to visit.      Allergies:   Morphine and related   Social History   Social History  . Marital status: Married    Spouse name: N/A  . Number of children: N/A  . Years of education: N/A   Social History Main Topics  . Smoking status: Never Smoker  . Smokeless tobacco: Never Used  . Alcohol use None  . Drug use: Unknown  . Sexual activity: Not Asked   Other Topics Concern  . None   Social History Narrative  . None     Family History:  The patient's family history includes Cancer in his sister; Emphysema in his father; Healthy in his sister; Heart failure in his father; Hypertension in his mother.      ROS:   Please see the history of present illness.    ROS All other systems reviewed and are negative.   PHYSICAL EXAM:   VS:  BP (!) 160/74   Pulse 66   Ht 5\' 9"  (1.753 m)   Wt 231 lb 1.9 oz (104.8 kg)   SpO2 98%   BMI 34.13 kg/m    GEN: Well nourished, well developed, in no acute distress obese HEENT: normal  Neck: no JVD, carotid bruits, or masses Cardiac: RRR; no murmurs, rubs, or gallops,no edema  Respiratory:  clear to auscultation bilaterally, normal work of breathing GI: soft, nontender, nondistended, + BS MS: no deformity or atrophy  Skin: warm and dry, no rash Neuro:  Alert and Oriented x 3, Strength and sensation  are intact Psych: euthymic mood, full affect    Wt Readings from Last 3 Encounters:  10/21/16 231 lb 1.9 oz (104.8 kg)  09/16/16 233 lb 12.8 oz (106.1 kg)  08/02/16 230 lb (104.3 kg)      Studies/Labs Reviewed:   EKG:  EKG is NOT ordered today.   Recent Labs: 12/18/2015: Brain Natriuretic Peptide 75.7  07/27/2016: ALT 13; BUN 27; Creatinine, Ser 1.09; Hemoglobin 12.0; Platelets 191.0; Potassium 4.5; Sodium 136; TSH 0.82 09/16/2016: NT-Pro BNP CANCELED   Lipid Panel    Component Value Date/Time   CHOL 175 04/26/2016 0915   TRIG 248.0 (H) 04/26/2016 0915   HDL 49.20 04/26/2016 0915   CHOLHDL 4 04/26/2016 0915   VLDL 49.6 (H) 04/26/2016 0915   LDLCALC 106 (H) 01/22/2016 0923   LDLDIRECT 101.0 04/26/2016 0915    Additional studies/ records that were reviewed today include:  2013 IMPRESSIONS:1.Total occlusion of the first diagonal. 90% stenosis of the left circumflex PDA. 2. Nondominant diffusely diseased right coronary 3. Low normal left ventricular function with EF of 50% and mild anterior wall hypokinesis. RECOMMENDATION:We will discuss with the patient. Likely management strategy will be medical therapy. The distal circumflex/PDA could be intervened upon although the patient is asymptomatic and no obvious ischemia was documented on nuclear scintigraphy..   2D ECHO: 10/01/2016 LV EF: 55% -   60% Study Conclusions - Left ventricle: The cavity size was normal. Wall thickness was   increased in a pattern of mild LVH. Systolic function was normal.   The estimated ejection fraction was in the range of 55% to 60%.   Wall motion was normal; there were no regional wall motion   abnormalities. Doppler parameters are consistent with abnormal   left ventricular relaxation (grade 1 diastolic dysfunction). - Aortic valve: Moderately calcified annulus. Moderately thickened,   moderately calcified leaflets. There was moderate stenosis. Valve   area (VTI): 1.28 cm^2. Valve area  (Vmax): 1.48 cm^2. Valve area   (Vmean): 1.34 cm^2. - Mitral valve: Valve area by continuity equation (using LVOT   flow): 2.41 cm^2.  48 hour holter monitor  Study Highlights    Normal sinus rhythm, HR range 38-124 bpm  PAC's and PVC's are noted. PVC's predominate and account for ~3% of all activity  No symptoms reported   Frequent PVC's less than 3%. Normal overall study. No atrial fib.      ASSESSMENT & PLAN:   DOE: will re order BNP and update nuclear stress test.   Bradycardia: improved off BB  Hypertensive heart disease with heart failure: BP still uncontrolled. Will increase amlodipine from 5mg  to 10mg  daily.   CAD: cath in 2013 showed CTO of diag and 90% LCx, treated medically. Will update nuclear stress test given DOE. -- Continue ASA and statin. No BB given hx of bradycardia.   Chronic diastolic CHF: appears euvolemic. Get pro BNP given continued SOB. Continue Lasix 40mg  daily and spiro 25mg  daily.   Mod AS: stable by most recent echo  OSA: does not tolerate CPAP well at all. Cannot sleep and feels very frustrated, worries this will affect his heart poorly. Will follow up with Dr. Radford Pax   DMT2: continue current regiemn   HLD: continue statin    Medication Adjustments/Labs and Tests Ordered: Current medicines are reviewed at length with the patient today.  Concerns regarding medicines are outlined above.  Medication changes, Labs and Tests ordered today are listed in the Patient Instructions below. Patient Instructions  Medication Instructions:  Your physician has recommended you make the following change in your medication:  1.  INCREASE the Amlodipine to 10 mg taking 1 tablet daily  Labwork: TODAY:  PRO BNP  Testing/Procedures: Your physician has requested that you have a lexiscan myoview. For further information please visit [email or web address]. Please follow instruction sheet, as given.   Follow-Up: Your physician recommends that you schedule  a  follow-up appointment in: 12/2016 WITH DR. Tamala Julian   Any Other Special Instructions Will Be Listed Below (If Applicable). Pharmacologic Stress Electrocardiogram A pharmacologic stress electrocardiogram is a heart (cardiac) test that uses nuclear imaging to evaluate the blood supply to your heart. This test may also be called a pharmacologic stress electrocardiography. Pharmacologic means that a medicine is used to increase your heart rate and blood pressure.  This stress test is done to find areas of poor blood flow to the heart by determining the extent of coronary artery disease (CAD). Some people exercise on a treadmill, which naturally increases the blood flow to the heart. For those people unable to exercise on a treadmill, a medicine is used. This medicine stimulates your heart and will cause your heart to beat harder and more quickly, as if you were exercising.  Pharmacologic stress tests can help determine:  The adequacy of blood flow to your heart during increased levels of activity in order to clear you for discharge home.  The extent of coronary artery blockage caused by CAD.  Your prognosis if you have suffered a heart attack.  The effectiveness of cardiac procedures done, such as an angioplasty, which can increase the circulation in your coronary arteries.  Causes of chest pain or pressure. LET [facility] CARE PROVIDER KNOW ABOUT:  Any allergies you have.  All medicines you are taking, including vitamins, herbs, eye drops, creams, and over-the-counter medicines.  Previous problems you or members of your family have had with the use of anesthetics.  Any blood disorders you have.  Previous surgeries you have had.  Medical conditions you have.  Possibility of pregnancy, if this applies.  If you are currently breastfeeding. RISKS AND COMPLICATIONS Generally, this is a safe procedure. However, as with any procedure, complications can occur. Possible complications  include:  You develop pain or pressure in the following areas:  Chest.  Jaw or neck.  Between your shoulder blades.  Radiating down your left arm.  Headache.  Dizziness or light-headedness.  Shortness of breath.  Increased or irregular heartbeat.  Low blood pressure.  Nausea or vomiting.  Flushing.  Redness going up the arm and slight pain during injection of medicine.  Heart attack (rare). BEFORE THE PROCEDURE   Avoid all forms of caffeine for 24 hours before your test or as directed by your health care provider. This includes coffee, tea (even decaffeinated tea), caffeinated sodas, chocolate, cocoa, and certain pain medicines.  Follow your health care provider's instructions regarding eating and drinking before the test.  Take your medicines as directed at regular times with water unless instructed otherwise. Exceptions may include:  If you have diabetes, ask how you are to take your insulin or pills. It is common to adjust insulin dosing the morning of the test.  If you are taking beta-blocker medicines, it is important to talk to your health care provider about these medicines well before the date of your test. Taking beta-blocker medicines may interfere with the test. In some cases, these medicines need to be changed or stopped 24 hours or more before the test.  If you wear a nitroglycerin patch, it may need to be removed prior to the test. Ask your health care provider if the patch should be removed before the test.  If you use an inhaler for any breathing condition, bring it with you to the test.  If you are an outpatient, bring a snack so you can eat right after the stress phase of the test.  Do not smoke for 4 hours prior to the test or as directed by your health care provider.  Do not apply lotions, powders, creams, or oils on your chest prior to the test.  Wear comfortable shoes and clothing. Let your health care provider know if you were unable to  complete or follow the preparations for your test. PROCEDURE   Multiple patches (electrodes) will be put on your chest. If needed, small areas of your chest may be shaved to get better contact with the electrodes. Once the electrodes are attached to your body, multiple wires will be attached to the electrodes, and your heart rate will be monitored.  An IV access will be started. A nuclear trace (isotope) is given. The isotope may be given intravenously, or it may be swallowed. Nuclear refers to several types of radioactive isotopes, and the nuclear isotope lights up the arteries so that the nuclear images are clear. The isotope is absorbed by your body. This results in low radiation exposure.  A resting nuclear image is taken to show how your heart functions at rest.  A medicine is given through the IV access.  A second scan is done about 1 hour after the medicine injection and determines how your heart functions under stress.  During this stress phase, you will be connected to an electrocardiogram machine. Your blood pressure and oxygen levels will be monitored. AFTER THE PROCEDURE   Your heart rate and blood pressure will be monitored after the test.  You may return to your normal schedule, including diet,activities, and medicines, unless your health care provider tells you otherwise. This information is not intended to replace advice given to you by your health care provider. Make sure you discuss any questions you have with your health care provider. Document Released: 01/09/2009 Document Revised: 08/28/2013 Document Reviewed: 04/30/2013 Elsevier Interactive Patient Education  2017 [employer].   If you need a refill on your cardiac medications before your next appointment, please call your pharmacy.      Signed, Angelena Form, PA-C  10/21/2016 12:32 PM    [facility] [address] [place], [place], [place]  [address] Phone: [phone]; Fax: [phone]

## 2016-10-21 ENCOUNTER — Telehealth (HOSPITAL_COMMUNITY): Payer: Self-pay | Admitting: *Deleted

## 2016-10-21 ENCOUNTER — Encounter: Payer: Self-pay | Admitting: Physician Assistant

## 2016-10-21 ENCOUNTER — Ambulatory Visit (INDEPENDENT_AMBULATORY_CARE_PROVIDER_SITE_OTHER): Payer: Medicare Other | Admitting: Physician Assistant

## 2016-10-21 VITALS — BP 160/74 | HR 66 | Ht 69.0 in | Wt 231.1 lb

## 2016-10-21 DIAGNOSIS — E785 Hyperlipidemia, unspecified: Secondary | ICD-10-CM

## 2016-10-21 DIAGNOSIS — I110 Hypertensive heart disease with heart failure: Secondary | ICD-10-CM

## 2016-10-21 DIAGNOSIS — R0609 Other forms of dyspnea: Secondary | ICD-10-CM

## 2016-10-21 DIAGNOSIS — I5032 Chronic diastolic (congestive) heart failure: Secondary | ICD-10-CM | POA: Diagnosis not present

## 2016-10-21 DIAGNOSIS — R001 Bradycardia, unspecified: Secondary | ICD-10-CM | POA: Diagnosis not present

## 2016-10-21 DIAGNOSIS — E118 Type 2 diabetes mellitus with unspecified complications: Secondary | ICD-10-CM

## 2016-10-21 DIAGNOSIS — I350 Nonrheumatic aortic (valve) stenosis: Secondary | ICD-10-CM

## 2016-10-21 DIAGNOSIS — G4733 Obstructive sleep apnea (adult) (pediatric): Secondary | ICD-10-CM

## 2016-10-21 DIAGNOSIS — Z9989 Dependence on other enabling machines and devices: Secondary | ICD-10-CM

## 2016-10-21 LAB — PRO B NATRIURETIC PEPTIDE: NT-PRO BNP: 399 pg/mL (ref 0–486)

## 2016-10-21 MED ORDER — AMLODIPINE BESYLATE 10 MG PO TABS: 10.0000 mg | ORAL_TABLET | Freq: Every day | ORAL | 3 refills | Status: DC

## 2016-10-21 NOTE — Patient Instructions (Addendum)
Medication Instructions:  Your physician has recommended you make the following change in your medication:  1.  INCREASE the Amlodipine to 10 mg taking 1 tablet daily  Labwork: TODAY:  PRO BNP  Testing/Procedures: Your physician has requested that you have a lexiscan myoview. For further information please visit [email or web address]. Please follow instruction sheet, as given.   Follow-Up: Your physician recommends that you schedule a follow-up appointment in: 12/2016 WITH DR. Tamala Julian   Any Other Special Instructions Will Be Listed Below (If Applicable). Pharmacologic Stress Electrocardiogram A pharmacologic stress electrocardiogram is a heart (cardiac) test that uses nuclear imaging to evaluate the blood supply to your heart. This test may also be called a pharmacologic stress electrocardiography. Pharmacologic means that a medicine is used to increase your heart rate and blood pressure.  This stress test is done to find areas of poor blood flow to the heart by determining the extent of coronary artery disease (CAD). Some people exercise on a treadmill, which naturally increases the blood flow to the heart. For those people unable to exercise on a treadmill, a medicine is used. This medicine stimulates your heart and will cause your heart to beat harder and more quickly, as if you were exercising.  Pharmacologic stress tests can help determine:  The adequacy of blood flow to your heart during increased levels of activity in order to clear you for discharge home.  The extent of coronary artery blockage caused by CAD.  Your prognosis if you have suffered a heart attack.  The effectiveness of cardiac procedures done, such as an angioplasty, which can increase the circulation in your coronary arteries.  Causes of chest pain or pressure. LET [facility] CARE PROVIDER KNOW ABOUT:  Any allergies you have.  All medicines you are taking, including vitamins, herbs, eye drops, creams, and  over-the-counter medicines.  Previous problems you or members of your family have had with the use of anesthetics.  Any blood disorders you have.  Previous surgeries you have had.  Medical conditions you have.  Possibility of pregnancy, if this applies.  If you are currently breastfeeding. RISKS AND COMPLICATIONS Generally, this is a safe procedure. However, as with any procedure, complications can occur. Possible complications include:  You develop pain or pressure in the following areas:  Chest.  Jaw or neck.  Between your shoulder blades.  Radiating down your left arm.  Headache.  Dizziness or light-headedness.  Shortness of breath.  Increased or irregular heartbeat.  Low blood pressure.  Nausea or vomiting.  Flushing.  Redness going up the arm and slight pain during injection of medicine.  Heart attack (rare). BEFORE THE PROCEDURE   Avoid all forms of caffeine for 24 hours before your test or as directed by your health care provider. This includes coffee, tea (even decaffeinated tea), caffeinated sodas, chocolate, cocoa, and certain pain medicines.  Follow your health care provider's instructions regarding eating and drinking before the test.  Take your medicines as directed at regular times with water unless instructed otherwise. Exceptions may include:  If you have diabetes, ask how you are to take your insulin or pills. It is common to adjust insulin dosing the morning of the test.  If you are taking beta-blocker medicines, it is important to talk to your health care provider about these medicines well before the date of your test. Taking beta-blocker medicines may interfere with the test. In some cases, these medicines need to be changed or stopped 24 hours or more before the test.  If you wear a nitroglycerin patch, it may need to be removed prior to the test. Ask your health care provider if the patch should be removed before the test.  If you use an  inhaler for any breathing condition, bring it with you to the test.  If you are an outpatient, bring a snack so you can eat right after the stress phase of the test.  Do not smoke for 4 hours prior to the test or as directed by your health care provider.  Do not apply lotions, powders, creams, or oils on your chest prior to the test.  Wear comfortable shoes and clothing. Let your health care provider know if you were unable to complete or follow the preparations for your test. PROCEDURE   Multiple patches (electrodes) will be put on your chest. If needed, small areas of your chest may be shaved to get better contact with the electrodes. Once the electrodes are attached to your body, multiple wires will be attached to the electrodes, and your heart rate will be monitored.  An IV access will be started. A nuclear trace (isotope) is given. The isotope may be given intravenously, or it may be swallowed. Nuclear refers to several types of radioactive isotopes, and the nuclear isotope lights up the arteries so that the nuclear images are clear. The isotope is absorbed by your body. This results in low radiation exposure.  A resting nuclear image is taken to show how your heart functions at rest.  A medicine is given through the IV access.  A second scan is done about 1 hour after the medicine injection and determines how your heart functions under stress.  During this stress phase, you will be connected to an electrocardiogram machine. Your blood pressure and oxygen levels will be monitored. AFTER THE PROCEDURE   Your heart rate and blood pressure will be monitored after the test.  You may return to your normal schedule, including diet,activities, and medicines, unless your health care provider tells you otherwise. This information is not intended to replace advice given to you by your health care provider. Make sure you discuss any questions you have with your health care provider. Document  Released: 01/09/2009 Document Revised: 08/28/2013 Document Reviewed: 04/30/2013 Elsevier Interactive Patient Education  2017 [employer].   If you need a refill on your cardiac medications before your next appointment, please call your pharmacy.

## 2016-10-21 NOTE — Telephone Encounter (Signed)
Left message on voicemail per DPR in reference to upcoming appointment scheduled on 10/26/16 with detailed instructions given per Myocardial Perfusion Study Information Sheet for the test. LM to arrive 15 minutes early, and that it is imperative to arrive on time for appointment to keep from having the test rescheduled. If you need to cancel or reschedule your appointment, please call the office within 24 hours of your appointment. Failure to do so may result in a cancellation of your appointment, and a $50 no show fee. Phone number given for call back for any questions. Kirstie Peri

## 2016-10-22 ENCOUNTER — Other Ambulatory Visit: Payer: Self-pay

## 2016-10-22 ENCOUNTER — Telehealth: Payer: Self-pay | Admitting: Endocrinology

## 2016-10-22 MED ORDER — INSULIN NPH (HUMAN) (ISOPHANE) 100 UNIT/ML ~~LOC~~ SUSP: 22.0000 [IU] | Freq: Every day | SUBCUTANEOUS | 1 refills | Status: DC

## 2016-10-22 NOTE — Telephone Encounter (Signed)
Patient need refill, Of insulin NPH Human (HUMULIN N,NOVOLIN N) 100 UNIT/ML injection  Send to  [place], [place] [phone] (Phone) [phone] (Fax)

## 2016-10-22 NOTE — Telephone Encounter (Signed)
Ordered 10/22/16

## 2016-10-26 ENCOUNTER — Ambulatory Visit (HOSPITAL_COMMUNITY): Payer: Medicare Other | Attending: Cardiovascular Disease

## 2016-10-26 DIAGNOSIS — I2510 Atherosclerotic heart disease of native coronary artery without angina pectoris: Secondary | ICD-10-CM | POA: Diagnosis not present

## 2016-10-26 DIAGNOSIS — R0609 Other forms of dyspnea: Secondary | ICD-10-CM | POA: Diagnosis present

## 2016-10-26 DIAGNOSIS — R9439 Abnormal result of other cardiovascular function study: Secondary | ICD-10-CM | POA: Diagnosis not present

## 2016-10-26 DIAGNOSIS — I10 Essential (primary) hypertension: Secondary | ICD-10-CM | POA: Insufficient documentation

## 2016-10-26 DIAGNOSIS — I4510 Unspecified right bundle-branch block: Secondary | ICD-10-CM | POA: Diagnosis not present

## 2016-10-26 DIAGNOSIS — E119 Type 2 diabetes mellitus without complications: Secondary | ICD-10-CM | POA: Insufficient documentation

## 2016-10-26 DIAGNOSIS — R5383 Other fatigue: Secondary | ICD-10-CM | POA: Insufficient documentation

## 2016-10-26 LAB — MYOCARDIAL PERFUSION IMAGING
CHL CUP NUCLEAR SDS: 5
CHL CUP NUCLEAR SRS: 11
LV dias vol: 194 mL (ref 62–150)
LV sys vol: 98 mL
Peak HR: 67 {beats}/min
RATE: 0.4
Rest HR: 53 {beats}/min
SSS: 16
TID: 1.11

## 2016-10-26 MED ORDER — REGADENOSON 0.4 MG/5ML IV SOLN
0.4000 mg | Freq: Once | INTRAVENOUS | Status: AC
  Administered 2016-10-26: 0.4 mg via INTRAVENOUS

## 2016-10-26 MED ORDER — TECHNETIUM TC 99M TETROFOSMIN IV KIT
10.1000 | PACK | Freq: Once | INTRAVENOUS | Status: AC | PRN
  Administered 2016-10-26: 10.1 via INTRAVENOUS
  Filled 2016-10-26: qty 11

## 2016-10-26 MED ORDER — TECHNETIUM TC 99M TETROFOSMIN IV KIT
31.9000 | PACK | Freq: Once | INTRAVENOUS | Status: AC | PRN
  Administered 2016-10-26: 31.9 via INTRAVENOUS
  Filled 2016-10-26: qty 32

## 2016-10-29 ENCOUNTER — Other Ambulatory Visit (INDEPENDENT_AMBULATORY_CARE_PROVIDER_SITE_OTHER): Payer: Medicare Other

## 2016-10-29 DIAGNOSIS — Z794 Long term (current) use of insulin: Secondary | ICD-10-CM

## 2016-10-29 DIAGNOSIS — D508 Other iron deficiency anemias: Secondary | ICD-10-CM

## 2016-10-29 DIAGNOSIS — E1165 Type 2 diabetes mellitus with hyperglycemia: Secondary | ICD-10-CM | POA: Diagnosis not present

## 2016-10-29 LAB — LIPID PANEL
CHOL/HDL RATIO: 3
Cholesterol: 180 mg/dL (ref 0–200)
HDL: 54.5 mg/dL (ref >39.00)
LDL Cholesterol: 94 mg/dL (ref 0–99)
NonHDL: 125.22
Triglycerides: 155 mg/dL — ABNORMAL HIGH (ref 0.0–149.0)
VLDL: 31 mg/dL (ref 0.0–40.0)

## 2016-10-29 LAB — COMPREHENSIVE METABOLIC PANEL
ALBUMIN: 4 g/dL (ref 3.5–5.2)
ALT: 14 U/L (ref 0–53)
AST: 16 U/L (ref 0–37)
Alkaline Phosphatase: 55 U/L (ref 39–117)
BUN: 31 mg/dL — ABNORMAL HIGH (ref 6–23)
CO2: 24 mEq/L (ref 19–32)
Calcium: 9.8 mg/dL (ref 8.4–10.5)
Chloride: 106 mEq/L (ref 96–112)
Creatinine, Ser: 1.02 mg/dL (ref 0.40–1.50)
GFR: 74.58 mL/min (ref >60.00)
Glucose, Bld: 136 mg/dL — ABNORMAL HIGH (ref 70–99)
POTASSIUM: 4.4 meq/L (ref 3.5–5.1)
Sodium: 136 mEq/L (ref 135–145)
TOTAL PROTEIN: 6.8 g/dL (ref 6.0–8.3)
Total Bilirubin: 0.4 mg/dL (ref 0.2–1.2)

## 2016-10-29 LAB — CBC
HCT: 35.8 % — ABNORMAL LOW (ref 39.0–52.0)
Hemoglobin: 11.8 g/dL — ABNORMAL LOW (ref 13.0–17.0)
MCHC: 32.9 g/dL (ref 30.0–36.0)
MCV: 92.3 fl (ref 78.0–100.0)
Platelets: 180 10*3/uL (ref 150.0–400.0)
RBC: 3.88 Mil/uL — ABNORMAL LOW (ref 4.22–5.81)
RDW: 14.3 % (ref 11.5–15.5)
WBC: 8.7 10*3/uL (ref 4.0–10.5)

## 2016-10-29 LAB — IBC PANEL
IRON: 60 ug/dL (ref 42–165)
Saturation Ratios: 16.8 % — ABNORMAL LOW (ref 20.0–50.0)
TRANSFERRIN: 255 mg/dL (ref 212.0–360.0)

## 2016-10-29 LAB — MICROALBUMIN / CREATININE URINE RATIO
CREATININE, U: 72.2 mg/dL
MICROALB UR: 4.2 mg/dL — AB (ref 0.0–1.9)
Microalb Creat Ratio: 5.8 mg/g (ref 0.0–30.0)

## 2016-10-29 LAB — HEMOGLOBIN A1C: HEMOGLOBIN A1C: 7.8 % — AB (ref 4.6–6.5)

## 2016-11-02 ENCOUNTER — Ambulatory Visit (INDEPENDENT_AMBULATORY_CARE_PROVIDER_SITE_OTHER): Payer: Medicare Other | Admitting: Endocrinology

## 2016-11-02 ENCOUNTER — Encounter: Payer: Self-pay | Admitting: Endocrinology

## 2016-11-02 VITALS — BP 118/62 | HR 64 | Ht 69.0 in | Wt 234.0 lb

## 2016-11-02 DIAGNOSIS — I10 Essential (primary) hypertension: Secondary | ICD-10-CM | POA: Diagnosis not present

## 2016-11-02 DIAGNOSIS — M25471 Effusion, right ankle: Secondary | ICD-10-CM

## 2016-11-02 DIAGNOSIS — Z794 Long term (current) use of insulin: Secondary | ICD-10-CM | POA: Diagnosis not present

## 2016-11-02 DIAGNOSIS — E1165 Type 2 diabetes mellitus with hyperglycemia: Secondary | ICD-10-CM

## 2016-11-02 DIAGNOSIS — M25472 Effusion, left ankle: Secondary | ICD-10-CM

## 2016-11-02 MED ORDER — INSULIN LISPRO 100 UNIT/ML (KWIKPEN): PEN_INJECTOR | SUBCUTANEOUS | 1 refills | Status: DC

## 2016-11-02 NOTE — Progress Notes (Signed)
Patient ID: Todd Romero, male   DOB: Mar 22, 1934, 83 y.o.   MRN: [id number]    Reason for Appointment:   Follow-up of chronic problems    History of Present Illness    Problem 1: DIARRHEA Appears to have resolved with reducing metformin from 2000 down to 1000 mg  Problem 2:  Diagnosis: Type 2 DIABETES MELITUS, date of diagnosis:  1997   He has been on various regimens for his diabetes and has been on bedtime insulin with NPH since 2005 He also has benefited from adding Victoza in 2011 with better postprandial control and some weight loss Previously his weight has been as much as 247 pounds  On Victoza  since 11/14 with further improvement in blood sugar control.  Since about 2015 his blood sugars have been overall mildly high with A1c over 7% consistently.  It is gradually getting higher and now 7.8%  Non-insulin hypoglycemic drugs: Amaryl in the a.m. and metformin ER 1 g twice a day.  Victoza 1.8 mg daily  Insulin regimen: NPH 22 units at bedtime    Current management, blood sugar patterns and problems identified:  His fasting blood sugars are fairly consistently near normal and he is checking these almost daily  He has again been irregular with checking his readings after evening meal and doing some reading midday and sometimes after lunch  Blood sugars after his meals are averaging about 175-185 but can be as high as 250, highest blood sugars are when he is eating out or getting more carbohydrates such as rice  Sometimes his larger meal is at lunch but otherwise after supper  He has not been able to exercise recently because of various factors  His weight has gone up slightly  He does do better with taking only 500 mg twice a day of his metformin ER, previously having diarrhea with higher doses  Compliant with Victoza daily     Side effects from medications: None  Monitors blood glucose:about 2x a day.    Glucometer:  Accu-Chek         Blood  Glucose readings from meter download:   Mean values apply above for all meters except median for One Touch  PRE-MEAL Fasting Lunch Dinner Bedtime Overall  Glucose range: 92-144   125-246     Mean/median:     147    POST-MEAL PC Breakfast PC Lunch PC Dinner  Glucose range:  105-250  168-227   Mean/median:  170  176      Physical activity: exercise: Minimal recently  Meal times: Dinner about 5-7 PM  Wt Readings from Last 3 Encounters:  11/02/16 234 lb (106.1 kg)  10/26/16 231 lb (104.8 kg)  10/21/16 231 lb 1.9 oz (104.8 kg)   LABS: Lab Results  Component Value Date   HGBA1C 7.8 (H) 10/29/2016   HGBA1C 7.6 (H) 07/27/2016   HGBA1C 7.5 (H) 04/26/2016   Lab Results  Component Value Date   MICROALBUR 4.2 (H) 10/29/2016   LDLCALC 94 10/29/2016   CREATININE 1.02 10/29/2016     OTHER active problems are discussed in review of systems   Lab on 10/29/2016  Component Date Value Ref Range Status  . Hgb A1c MFr Bld 10/29/2016 7.8* 4.6 - 6.5 % Final  . Sodium 10/29/2016 136  135 - 145 mEq/L Final  . Potassium 10/29/2016 4.4  3.5 - 5.1 mEq/L Final  . Chloride 10/29/2016 106  96 - 112 mEq/L Final  . CO2 10/29/2016 24  19 - 32 mEq/L Final  . Glucose, Bld 10/29/2016 136* 70 - 99 mg/dL Final  . BUN 10/29/2016 31* 6 - 23 mg/dL Final  . Creatinine, Ser 10/29/2016 1.02  0.40 - 1.50 mg/dL Final  . Total Bilirubin 10/29/2016 0.4  0.2 - 1.2 mg/dL Final  . Alkaline Phosphatase 10/29/2016 55  39 - 117 U/L Final  . AST 10/29/2016 16  0 - 37 U/L Final  . ALT 10/29/2016 14  0 - 53 U/L Final  . Total Protein 10/29/2016 6.8  6.0 - 8.3 g/dL Final  . Albumin 10/29/2016 4.0  3.5 - 5.2 g/dL Final  . Calcium 10/29/2016 9.8  8.4 - 10.5 mg/dL Final  . GFR 10/29/2016 74.58  >60.00 mL/min Final  . Cholesterol 10/29/2016 180  0 - 200 mg/dL Final  . Triglycerides 10/29/2016 155.0* 0.0 - 149.0 mg/dL Final  . HDL 10/29/2016 54.50  >39.00 mg/dL Final  . VLDL 10/29/2016 31.0  0.0 - 40.0 mg/dL Final  .  LDL Cholesterol 10/29/2016 94  0 - 99 mg/dL Final  . Total CHOL/HDL Ratio 10/29/2016 3   Final  . NonHDL 10/29/2016 125.22   Final  . Microalb, Ur 10/29/2016 4.2* 0.0 - 1.9 mg/dL Final  . Creatinine,U 10/29/2016 72.2  mg/dL Final  . Microalb Creat Ratio 10/29/2016 5.8  0.0 - 30.0 mg/g Final  . WBC 10/29/2016 8.7  4.0 - 10.5 K/uL Final  . RBC 10/29/2016 3.88* 4.22 - 5.81 Mil/uL Final  . Platelets 10/29/2016 180.0  150.0 - 400.0 K/uL Final  . Hemoglobin 10/29/2016 11.8* 13.0 - 17.0 g/dL Final  . HCT 10/29/2016 35.8* 39.0 - 52.0 % Final  . MCV 10/29/2016 92.3  78.0 - 100.0 fl Final  . MCHC 10/29/2016 32.9  30.0 - 36.0 g/dL Final  . RDW 10/29/2016 14.3  11.5 - 15.5 % Final  . Iron 10/29/2016 60  42 - 165 ug/dL Final  . Transferrin 10/29/2016 255.0  212.0 - 360.0 mg/dL Final  . Saturation Ratios 10/29/2016 16.8* 20.0 - 50.0 % Final    Allergies as of 11/02/2016      Reactions   Morphine And Related Nausea And Vomiting      Medication List       Accurate as of 11/02/16 10:59 AM. Always use your most recent med list.          ACCU-CHEK AVIVA PLUS test strip Generic drug:  glucose blood USE AS INSTRUCTED TO CHECK BLOOD SUGAR TWICE A DAY   amLODipine 10 MG tablet Commonly known as:  NORVASC Take 1 tablet (10 mg total) by mouth daily.   aspirin 81 MG tablet Take 81 mg by mouth daily.   B-D UF III MINI PEN NEEDLES 31G X 5 MM Misc Generic drug:  Insulin Pen Needle USE 1 PEN NEEDLE PER DAY   ferrous sulfate 325 (65 FE) MG tablet Take 325 mg by mouth daily with breakfast.   fish oil-omega-3 fatty acids 1000 MG capsule Take 2 g by mouth daily.   furosemide 40 MG tablet Commonly known as:  LASIX Take 40 mg by mouth daily.   glimepiride 2 MG tablet Commonly known as:  AMARYL TAKE ONE-HALF (1/2) TABLET DAILY   ibuprofen 200 MG tablet Commonly known as:  ADVIL,MOTRIN Take 200 mg by mouth every 6 (six) hours as needed for headache or moderate pain.   insulin NPH Human 100  UNIT/ML injection Commonly known as:  HUMULIN N,NOVOLIN N Inject 0.22 mLs (22 Units total) into the skin at  bedtime. Uses Vial   Insulin Syringe-Needle U-100 31G X 5/16" 1 ML Misc Commonly known as:  BD INSULIN SYRINGE ULTRAFINE Use to inject insulin daily   liraglutide 18 MG/3ML Sopn Commonly known as:  VICTOZA Inject 0.3 mLs (1.8 mg total) into the skin daily.   metFORMIN 500 MG (MOD) 24 hr tablet Commonly known as:  GLUMETZA Take 1 tablet (500 mg total) by mouth QID.   multivitamin with minerals Tabs tablet Take 1 tablet by mouth daily.   olmesartan 40 MG tablet Commonly known as:  BENICAR Take 1 tablet (40 mg total) by mouth daily.   pravastatin 80 MG tablet Commonly known as:  PRAVACHOL TAKE 1 TABLET DAILY (DOSE INCREASE)   PRESERVISION/LUTEIN Caps Take 1 capsule by mouth daily.   sodium chloride 0.65 % Soln nasal spray Commonly known as:  OCEAN Place 2 sprays into both nostrils 2 (two) times daily.   spironolactone 25 MG tablet Commonly known as:  ALDACTONE Take 1 tablet (25 mg total) by mouth daily.   tamsulosin 0.4 MG Caps capsule Commonly known as:  FLOMAX Take 1 capsule (0.4 mg total) by mouth daily.       Allergies:  Allergies  Allergen Reactions  . Morphine And Related Nausea And Vomiting    Past Medical History:  Diagnosis Date  . CAD (coronary artery disease)   . Diabetes ([place])   . Hyperlipidemia   . Obesity (BMI 30-39.9) 06/18/2015  . OSA (obstructive sleep apnea)    Severe with AHI 27/hr now on CPAP  . Pure hypercholesterolemia   . RBBB   . Type II or unspecified type diabetes mellitus without mention of complication, not stated as uncontrolled   . Unspecified essential hypertension     Past Surgical History:  Procedure Laterality Date  . ANTERIOR CRUCIATE LIGAMENT REPAIR    . CARPAL TUNNEL RELEASE    . VEIN SURGERY      Family History  Problem Relation Age of Onset  . Heart failure Father   . Emphysema Father   .  Hypertension Mother   . Cancer Sister   . Healthy Sister     Social History:  reports that he has never smoked. He has never used smokeless tobacco. His alcohol and drug histories are not on file.  Review of Systems:  HYPERTENSION:  Blood pressure is Usually well controlled, tends to be higher in the office He has not checked blood pressure at home    First blood pressure reading was high again today  With higher readings his cardiologist started him on 10 mg amlodipine but he is starting to have edema of his ankles now    BP Readings from Last 3 Encounters:  11/02/16 118/62  10/21/16 (!) 160/74  09/16/16 (!) 178/80     HYPERLIPIDEMIA: The lipid abnormality consists of elevated LDL and he is taking 80 mg Pravachol, no side effects. LDL is Slightly better now at 94    Lab Results  Component Value Date   CHOL 180 10/29/2016   CHOL 175 04/26/2016   CHOL 176 01/22/2016   Lab Results  Component Value Date   HDL 54.50 10/29/2016   HDL 49.20 04/26/2016   HDL 41.80 01/22/2016   Lab Results  Component Value Date   LDLCALC 94 10/29/2016   LDLCALC 106 (H) 01/22/2016   LDLCALC 100 (H) 01/13/2015   Lab Results  Component Value Date   TRIG 155.0 (H) 10/29/2016   TRIG 248.0 (H) 04/26/2016   TRIG 140.0 01/22/2016  Lab Results  Component Value Date   CHOLHDL 3 10/29/2016   CHOLHDL 4 04/26/2016   CHOLHDL 4 01/22/2016   Lab Results  Component Value Date   LDLDIRECT 101.0 04/26/2016    History of daytime fatigability and somnolence if sitting still, Secondary to sleep apnea.    Has having difficulty tolerating his  CPAP treatment but is Trying to use it at least 4 hours at night   He complains of shortness of breath on exertion,Now getting further evaluation from cardiologist Apparently has  aortic stenosis but normal LV function  History of overactive bladder for which he takes Enablex only when he is traveling  Mild anemia: He is taking iron supplements 3 times  a week Hemoglobin is slightly Lower, last iron level is normal  Has had negative Hemoccult No history of B-12 deficiency despite taking metformin long-term   Lab Results  Component Value Date   WBC 8.7 10/29/2016   HGB 11.8 (L) 10/29/2016   HCT 35.8 (L) 10/29/2016   MCV 92.3 10/29/2016   PLT 180.0 10/29/2016   Lab Results  Component Value Date   OCCULTBLD Negative 02/13/2016   OCCULTBLD Negative 04/12/2014      ROS    Physical Exam   BP 118/62   Pulse 64   Ht 5\' 9"  (1.753 m)   Wt 234 lb (106.1 kg)   SpO2 97%   BMI 34.56 kg/m   No dyspnea at rest 2+ ankle edema seen  ASSESSMENT/ PLAN:   Diabetes type 2 with obesity  See history of present illness for detailed discussion of his current management, blood sugar patterns and problems identified  The patient's diabetes control appears to be gradually worsening He appears to have postprandial hyperglycemia which is probably causing his A1c to be 7.8 This is despite his morning blood sugars averaging 118  Recently has had consistent readings after meals prompting the  need to start mealtime insulin coverage His diet is fair and only occasionally has excessive carbohydrate or higher fat meals  Today discussed in detail the need for mealtime insulin to cover postprandial spikes, action of mealtime insulin, use of the insulin pen, timing and action of the rapid acting insulin as well as starting dose and dosage titration to target the two-hour reading of under 180 He will start with 4-6 units of Humalog with his main meal and discussed adjusting his based on his blood sugar readings 2 hours after eating, this may be either with lunch or dinner whichever his meal is larger  DIARRHEA: Resolved with stopping the higher dose of metformin   ANEMIA: Likely to be from chronic disease   Will continue to follow  HYPERLIPIDEMIA: He has a LDL below 100 now Continue pravastatin 80 mg  HYPERTENSION: Blood pressure is low  normal, probably coming down significantly with increasing amlodipine to 10 mg Also with this he is getting ankle edema now Will reduce this down to 5 mg now   Counseling time on subjects discussed above is over 50% of today's 25 minute visit  Patient Instructions  Check blood sugars on waking up    Also check blood sugars about 2 hours after a meal and do this after different meals by rotation  Recommended blood sugar levels on waking up is 90-130 and about 2 hours after meal is 130-180  Please bring your blood sugar monitor to each visit, thank you   Take 1/2 amlodipine     Gauri Galvao 11/02/2016, 10:59 AM

## 2016-11-02 NOTE — Patient Instructions (Addendum)
Check blood sugars on waking up    Also check blood sugars about 2 hours after a meal and do this after different meals by rotation  Recommended blood sugar levels on waking up is 90-130 and about 2 hours after meal is 130-180  Please bring your blood sugar monitor to each visit, thank you   Take 1/2 amlodipine

## 2016-11-16 ENCOUNTER — Telehealth: Payer: Self-pay | Admitting: Endocrinology

## 2016-11-16 ENCOUNTER — Other Ambulatory Visit: Payer: Self-pay

## 2016-11-16 MED ORDER — GLIMEPIRIDE 2 MG PO TABS: 1.0000 mg | ORAL_TABLET | Freq: Every day | ORAL | 1 refills | Status: DC

## 2016-11-16 NOTE — Telephone Encounter (Signed)
Pt needs his Glimepiride refilled and sent to [facility].

## 2016-11-16 NOTE — Telephone Encounter (Signed)
Refill sent on glimiperide

## 2016-12-03 ENCOUNTER — Other Ambulatory Visit: Payer: Self-pay | Admitting: Endocrinology

## 2016-12-06 NOTE — Telephone Encounter (Signed)
[facility] calling for clarification of the syringe rx, pt was being rx pen needles is he now receiving vials of insulin in which he needs syringes

## 2016-12-07 ENCOUNTER — Other Ambulatory Visit: Payer: Self-pay

## 2016-12-07 NOTE — Telephone Encounter (Signed)
Ordered

## 2016-12-08 ENCOUNTER — Other Ambulatory Visit: Payer: Self-pay | Admitting: Endocrinology

## 2016-12-08 ENCOUNTER — Other Ambulatory Visit (INDEPENDENT_AMBULATORY_CARE_PROVIDER_SITE_OTHER): Payer: Medicare Other

## 2016-12-08 ENCOUNTER — Telehealth: Payer: Self-pay

## 2016-12-08 DIAGNOSIS — E1165 Type 2 diabetes mellitus with hyperglycemia: Secondary | ICD-10-CM | POA: Diagnosis not present

## 2016-12-08 DIAGNOSIS — E118 Type 2 diabetes mellitus with unspecified complications: Secondary | ICD-10-CM | POA: Diagnosis not present

## 2016-12-08 DIAGNOSIS — I5032 Chronic diastolic (congestive) heart failure: Secondary | ICD-10-CM

## 2016-12-08 LAB — BASIC METABOLIC PANEL
BUN: 38 mg/dL — AB (ref 6–23)
CALCIUM: 9.7 mg/dL (ref 8.4–10.5)
CO2: 21 meq/L (ref 19–32)
Chloride: 103 mEq/L (ref 96–112)
Creatinine, Ser: 1.21 mg/dL (ref 0.40–1.50)
GFR: 61.22 mL/min (ref >60.00)
GLUCOSE: 191 mg/dL — AB (ref 70–99)
POTASSIUM: 4.5 meq/L (ref 3.5–5.1)
SODIUM: 132 meq/L — AB (ref 135–145)

## 2016-12-08 NOTE — Telephone Encounter (Signed)
Requested a call back from the patient to further discuss.  

## 2016-12-08 NOTE — Telephone Encounter (Signed)
He needs to start taking 4 units of Humalog with every meal and if the blood sugar is over 200 he will take 8 units instead of 4. Also increase the NPH up to 26 units until morning sugars are below 150

## 2016-12-08 NOTE — Telephone Encounter (Signed)
Patient notified of new instructions and voiced understanding. Patient will call back to report if blood sugars drop too low.

## 2016-12-08 NOTE — Telephone Encounter (Signed)
Patient came in the office today for blood tests. While he was here he advised me on 12/07/2016 he received a cortisone injection in his right knee and was advised this injection could raise his blood sugars. Patient stated his fasting blood sugar this morning was 200. Patient confirmed he is currently taking humalog 4 units with his main meal of the day and humulin N 22 units at bedtime. Patient wanted to know if any adjustments should be made? Thanks!

## 2016-12-09 LAB — FRUCTOSAMINE: Fructosamine: 294 umol/L — ABNORMAL HIGH (ref 0–285)

## 2016-12-16 ENCOUNTER — Encounter: Payer: Self-pay | Admitting: Endocrinology

## 2016-12-16 ENCOUNTER — Ambulatory Visit (INDEPENDENT_AMBULATORY_CARE_PROVIDER_SITE_OTHER): Payer: Medicare Other | Admitting: Endocrinology

## 2016-12-16 VITALS — BP 110/60 | HR 62 | Ht 69.0 in | Wt 234.0 lb

## 2016-12-16 DIAGNOSIS — I10 Essential (primary) hypertension: Secondary | ICD-10-CM | POA: Diagnosis not present

## 2016-12-16 DIAGNOSIS — E782 Mixed hyperlipidemia: Secondary | ICD-10-CM

## 2016-12-16 DIAGNOSIS — E1165 Type 2 diabetes mellitus with hyperglycemia: Secondary | ICD-10-CM

## 2016-12-16 DIAGNOSIS — Z794 Long term (current) use of insulin: Secondary | ICD-10-CM

## 2016-12-16 DIAGNOSIS — D508 Other iron deficiency anemias: Secondary | ICD-10-CM | POA: Diagnosis not present

## 2016-12-16 NOTE — Progress Notes (Signed)
Patient ID: Todd Romero, male   DOB: 03/31/1934, 83 y.o.   MRN: [id number]    Reason for Appointment:   Follow-up of chronic problems    History of Present Illness     Diagnosis: Type 2 DIABETES MELITUS, date of diagnosis:  1997   He has been on various regimens for his diabetes and has been on bedtime insulin with NPH since 2005 He also has benefited from adding Victoza in 2011 with better postprandial control and some weight loss Previously his weight has been as much as 247 pounds  On Victoza  since 11/14 with further improvement in blood sugar control.  Since about 2015 his blood sugars have been overall mildly high with A1c over 7% consistently.  It is gradually getting higher and now 7.8%  Non-insulin hypoglycemic drugs: Amaryl in the a.m. and metformin ER 1 g twice a day.  Victoza 1.8 mg daily  Insulin regimen: NPH 22 units at bedtime   Humalog at main meal: 4 units  Current management, blood sugar patterns and problems identified:  He was started on HUMALOG on his last visit because of persistently high readings after supper  He was told to increase the dose of 4 units if his readings were still over 180 after supper but he has not changed the dose  His fasting blood sugars are fairly consistently near normal and he is checking these almost daily  He had a spike in his blood sugars after a steroid injection in his knee about 10 days ago and was told to increase his Humalog and take it with every meal temporarily  He is concerned about wasting the Humalog insulin as he is priming the pen every time he does the injection  He says that he finds it more convenient to take the injection right after eating especially with going out and he thinks it does better  Also he has increased his NPH up to 26 since his steroid injection but has not cut back the dose back to the 22 units  Has sporadically higher readings after lunch, not consistent, maybe more if he  is eating out; he will take his Humalog at lunch if this is his main meal      Side effects from medications: None  Monitors blood glucose:about 2x a day.    Glucometer:  Accu-Chek         Blood Glucose readings from meter download:   Mean values apply above for all meters except median for One Touch  PRE-MEAL Fasting Lunch Dinner Bedtime Overall  Glucose range:  80-200     10 7-336    Mean/median: 108   189 146   POST-MEAL PC Breakfast PC Lunch PC Dinner  Glucose range:  10 9-232    Mean/median:  169      Physical activity: exercise: Minimal recently  Meal times: Dinner about 5-7 PM  Wt Readings from Last 3 Encounters:  12/16/16 234 lb (106.1 kg)  11/02/16 234 lb (106.1 kg)  10/26/16 231 lb (104.8 kg)   LABS: Lab Results  Component Value Date   HGBA1C 7.8 (H) 10/29/2016   HGBA1C 7.6 (H) 07/27/2016   HGBA1C 7.5 (H) 04/26/2016   Lab Results  Component Value Date   MICROALBUR 4.2 (H) 10/29/2016   LDLCALC 94 10/29/2016   CREATININE 1.21 12/08/2016     OTHER active problems are discussed in review of systems   No visits with results within 1 Week(s) from this visit.  Latest known visit with results is:  Lab on 12/08/2016  Component Date Value Ref Range Status  . Sodium 12/08/2016 132* 135 - 145 mEq/L Final  . Potassium 12/08/2016 4.5  3.5 - 5.1 mEq/L Final  . Chloride 12/08/2016 103  96 - 112 mEq/L Final  . CO2 12/08/2016 21  19 - 32 mEq/L Final  . Glucose, Bld 12/08/2016 191* 70 - 99 mg/dL Final  . BUN 12/08/2016 38* 6 - 23 mg/dL Final  . Creatinine, Ser 12/08/2016 1.21  0.40 - 1.50 mg/dL Final  . Calcium 12/08/2016 9.7  8.4 - 10.5 mg/dL Final  . GFR 12/08/2016 61.22  >60.00 mL/min Final  . Fructosamine 12/08/2016 294* 0 - 285 umol/L Final   Comment: Published reference interval for apparently healthy subjects between age 60 and 28 is 77 - 285 umol/L and in a poorly controlled diabetic population is 228 - 563 umol/L with a mean of 396 umol/L.      Allergies as of 12/16/2016      Reactions   Morphine And Related Nausea And Vomiting      Medication List       Accurate as of 12/16/16  3:49 PM. Always use your most recent med list.          ACCU-CHEK AVIVA PLUS test strip Generic drug:  glucose blood USE AS INSTRUCTED TO CHECK BLOOD SUGAR TWICE A DAY   amLODipine 10 MG tablet Commonly known as:  NORVASC Take 1 tablet (10 mg total) by mouth daily.   aspirin 81 MG tablet Take 81 mg by mouth daily.   B-D INS SYR ULTRAFINE 1CC/31G 31G X 5/16" 1 ML Misc Generic drug:  Insulin Syringe-Needle U-100 USE TO INJECT INSULIN DAILY   B-D UF III MINI PEN NEEDLES 31G X 5 MM Misc Generic drug:  Insulin Pen Needle USE 1 PEN NEEDLE PER DAY   ferrous sulfate 325 (65 FE) MG tablet Take 325 mg by mouth daily with breakfast.   fish oil-omega-3 fatty acids 1000 MG capsule Take 2 g by mouth daily.   furosemide 40 MG tablet Commonly known as:  LASIX Take 40 mg by mouth daily.   glimepiride 2 MG tablet Commonly known as:  AMARYL Take 0.5 tablets (1 mg total) by mouth daily.   ibuprofen 200 MG tablet Commonly known as:  ADVIL,MOTRIN Take 200 mg by mouth every 6 (six) hours as needed for headache or moderate pain.   insulin lispro 100 UNIT/ML KiwkPen Commonly known as:  HUMALOG KWIKPEN 4-8 units before the main meals of the day   insulin NPH Human 100 UNIT/ML injection Commonly known as:  HUMULIN N,NOVOLIN N Inject 0.22 mLs (22 Units total) into the skin at bedtime. Uses Vial   liraglutide 18 MG/3ML Sopn Commonly known as:  VICTOZA Inject 0.3 mLs (1.8 mg total) into the skin daily.   metFORMIN 500 MG (MOD) 24 hr tablet Commonly known as:  GLUMETZA Take 1 tablet (500 mg total) by mouth QID.   multivitamin with minerals Tabs tablet Take 1 tablet by mouth daily.   olmesartan 40 MG tablet Commonly known as:  BENICAR Take 1 tablet (40 mg total) by mouth daily.   pravastatin 80 MG tablet Commonly known as:   PRAVACHOL TAKE 1 TABLET DAILY (DOSE INCREASE)   PRESERVISION/LUTEIN Caps Take 1 capsule by mouth daily.   sodium chloride 0.65 % Soln nasal spray Commonly known as:  OCEAN Place 2 sprays into both nostrils 2 (two) times daily.   spironolactone 25  MG tablet Commonly known as:  ALDACTONE Take 1 tablet (25 mg total) by mouth daily.   tamsulosin 0.4 MG Caps capsule Commonly known as:  FLOMAX Take 1 capsule (0.4 mg total) by mouth daily.       Allergies:  Allergies  Allergen Reactions  . Morphine And Related Nausea And Vomiting    Past Medical History:  Diagnosis Date  . CAD (coronary artery disease)   . Diabetes ([place])   . Hyperlipidemia   . Obesity (BMI 30-39.9) 06/18/2015  . OSA (obstructive sleep apnea)    Severe with AHI 27/hr now on CPAP  . Pure hypercholesterolemia   . RBBB   . Type II or unspecified type diabetes mellitus without mention of complication, not stated as uncontrolled   . Unspecified essential hypertension     Past Surgical History:  Procedure Laterality Date  . ANTERIOR CRUCIATE LIGAMENT REPAIR    . CARPAL TUNNEL RELEASE    . VEIN SURGERY      Family History  Problem Relation Age of Onset  . Heart failure Father   . Emphysema Father   . Hypertension Mother   . Cancer Sister   . Healthy Sister     Social History:  reports that he has never smoked. He has never used smokeless tobacco. His alcohol and drug histories are not on file.  Review of Systems:  HYPERTENSION:  Blood pressure is Usually well controlled He has not checked blood pressure at home    Since he was having edema he was told to reduce his amlodipine to 5 mg, and edema is better and blood pressure is still well controlled   BP Readings from Last 3 Encounters:  12/16/16 110/60  11/02/16 118/62  10/21/16 (!) 160/74     HYPERLIPIDEMIA: The lipid abnormality consists of elevated LDL and he is taking 80 mg Pravachol, no side effects. LDL is Slightly better  at  94    Lab Results  Component Value Date   CHOL 180 10/29/2016   CHOL 175 04/26/2016   CHOL 176 01/22/2016   Lab Results  Component Value Date   HDL 54.50 10/29/2016   HDL 49.20 04/26/2016   HDL 41.80 01/22/2016   Lab Results  Component Value Date   LDLCALC 94 10/29/2016   LDLCALC 106 (H) 01/22/2016   LDLCALC 100 (H) 01/13/2015   Lab Results  Component Value Date   TRIG 155.0 (H) 10/29/2016   TRIG 248.0 (H) 04/26/2016   TRIG 140.0 01/22/2016   Lab Results  Component Value Date   CHOLHDL 3 10/29/2016   CHOLHDL 4 04/26/2016   CHOLHDL 4 01/22/2016   Lab Results  Component Value Date   LDLDIRECT 101.0 04/26/2016    Has having difficulty tolerating his  CPAP treatment but is Trying to use it at least 4 hours at night   He complains of shortness of breath on exertion, Apparently has  aortic stenosis but normal LV function  History of overactive bladder for which he takes Enablex only when he is traveling  Mild anemia: He is taking iron supplements 3 times a week Hemoglobin is slightly Lower, last iron level is normal  No history of B-12 deficiency despite taking metformin long-term   Lab Results  Component Value Date   WBC 8.7 10/29/2016   HGB 11.8 (L) 10/29/2016   HCT 35.8 (L) 10/29/2016   MCV 92.3 10/29/2016   PLT 180.0 10/29/2016   Lab Results  Component Value Date   OCCULTBLD Negative 02/13/2016  OCCULTBLD Negative 04/12/2014      ROS    Physical Exam   BP 110/60   Pulse 62   Ht 5\' 9"  (1.753 m)   Wt 234 lb (106.1 kg)   BMI 34.56 kg/m   Repeat blood pressure 130/68 No significant ankle edema present    ASSESSMENT/ PLAN:   Diabetes type 2 with obesity  See history of present illness for detailed discussion of his current management, blood sugar patterns and problems identified  He is now requiring basal bolus insulin regimen for control, last A1c 7.8 Although he was started on mealtime insulin he is not taking enough and discussed  that he needs to continue adjusting to keep his postprandial readings under 180 which has not been achieved Average blood sugar after supper his near 200 but also had high readings after a steroid injection His diet can be a little better at times, sometimes get higher fat meals Unclear if he is benefiting much from metformin, can only tolerate about 2 tablets a day because of diarrhea with higher doses   Recommendations:  Take at least 6 units Humalog at supper more with his main meal  He can take injection right after eating if he is eating out or otherwise before eating  If eating a higher carbohydrate or fat meal especially at restaurants or parties he can take up to 8 units, however recommended he be more consistent with moderating portions   Does not need to prime his insulin pen every time  Reduce NPH to 24 units  If fasting readings are out of range he can adjust 2 units as discussed  He can monitor less often in the morning and more after lunch   HYPERTENSION: Blood pressure is  excellent and edema is better with reducing amlodipine 5 mg, to continue  Dyspnea:?  Diastolic dysfunction He will follow-up with cardiologist    Counseling time on subjects discussed above is over 50% of today's 25 minute visit  Patient Instructions  Humalog 6 units at supper with the meal  N insulin 24 units at [place] 12/16/2016, 3:49 PM

## 2016-12-16 NOTE — Patient Instructions (Addendum)
Humalog 6 units at supper with the meal  N insulin 24 units at nite

## 2016-12-22 ENCOUNTER — Ambulatory Visit (INDEPENDENT_AMBULATORY_CARE_PROVIDER_SITE_OTHER): Payer: Medicare Other | Admitting: Interventional Cardiology

## 2016-12-22 ENCOUNTER — Encounter: Payer: Self-pay | Admitting: Interventional Cardiology

## 2016-12-22 ENCOUNTER — Encounter: Payer: Self-pay | Admitting: *Deleted

## 2016-12-22 ENCOUNTER — Encounter (INDEPENDENT_AMBULATORY_CARE_PROVIDER_SITE_OTHER): Payer: Self-pay

## 2016-12-22 VITALS — BP 140/68 | HR 60 | Ht 69.0 in | Wt 241.0 lb

## 2016-12-22 DIAGNOSIS — I25119 Atherosclerotic heart disease of native coronary artery with unspecified angina pectoris: Secondary | ICD-10-CM

## 2016-12-22 DIAGNOSIS — I5032 Chronic diastolic (congestive) heart failure: Secondary | ICD-10-CM

## 2016-12-22 DIAGNOSIS — Z01812 Encounter for preprocedural laboratory examination: Secondary | ICD-10-CM

## 2016-12-22 DIAGNOSIS — I10 Essential (primary) hypertension: Secondary | ICD-10-CM | POA: Diagnosis not present

## 2016-12-22 DIAGNOSIS — I4510 Unspecified right bundle-branch block: Secondary | ICD-10-CM

## 2016-12-22 DIAGNOSIS — I350 Nonrheumatic aortic (valve) stenosis: Secondary | ICD-10-CM | POA: Diagnosis not present

## 2016-12-22 LAB — CBC
HEMATOCRIT: 34.5 % — AB (ref 37.5–51.0)
Hemoglobin: 11.4 g/dL — ABNORMAL LOW (ref 13.0–17.7)
MCH: 30.3 pg (ref 26.6–33.0)
MCHC: 33 g/dL (ref 31.5–35.7)
MCV: 92 fL (ref 79–97)
PLATELETS: 198 10*3/uL (ref 150–379)
RBC: 3.76 x10E6/uL — AB (ref 4.14–5.80)
RDW: 14.8 % (ref 12.3–15.4)
WBC: 10 10*3/uL (ref 3.4–10.8)

## 2016-12-22 LAB — BASIC METABOLIC PANEL
BUN / CREAT RATIO: 23 (ref 10–24)
BUN: 22 mg/dL (ref 8–27)
CALCIUM: 9.5 mg/dL (ref 8.6–10.2)
CHLORIDE: 100 mmol/L (ref 96–106)
CO2: 22 mmol/L (ref 18–29)
CREATININE: 0.94 mg/dL (ref 0.76–1.27)
GFR calc Af Amer: 88 mL/min/{1.73_m2} (ref >59)
GFR calc non Af Amer: 76 mL/min/{1.73_m2} (ref >59)
GLUCOSE: 101 mg/dL — AB (ref 65–99)
Potassium: 5.2 mmol/L (ref 3.5–5.2)
Sodium: 137 mmol/L (ref 134–144)

## 2016-12-22 LAB — PROTIME-INR
INR: 1 (ref 0.8–1.2)
PROTHROMBIN TIME: 10.4 s (ref 9.1–12.0)

## 2016-12-22 NOTE — Patient Instructions (Addendum)
Medication Instructions:  None  Labwork: CBC, INR and BMET today.  Testing/Procedures: Your physician has requested that you have a cardiac catheterization. Cardiac catheterization is used to diagnose and/or treat various heart conditions. Doctors may recommend this procedure for a number of different reasons. The most common reason is to evaluate chest pain. Chest pain can be a symptom of coronary artery disease (CAD), and cardiac catheterization can show whether plaque is narrowing or blocking your heart's arteries. This procedure is also used to evaluate the valves, as well as measure the blood flow and oxygen levels in different parts of your heart. For further information please visit [email or web address]. Please follow instruction sheet, as given.   Follow-Up: Your physician recommends that you schedule a follow-up appointment in: 1-3 weeks after cath on 4/24.  (Can have 11:40A on 01/05/17)   Any Other Special Instructions Will Be Listed Below (If Applicable).     If you need a refill on your cardiac medications before your next appointment, please call your pharmacy.

## 2016-12-22 NOTE — Progress Notes (Signed)
Cardiology Office Note    Date:  12/22/2016   ID:  Fatih Stalvey [place], DOB 02-Aug-1934, MRN [id number]  PCP:  [name], MD  Cardiologist: Sinclair Grooms, MD   Chief Complaint  Patient presents with  . Coronary Artery Disease    History of Present Illness:  Todd Romero is a 83 y.o. male with a history of mod aortic stenosis, chronic diastolic heart failure, CAD with CTO of D1 and 90% LCX (cath 2013), OSA on CPAP, HTN, type 2 diabetes, and hyperlipidemia.  Fatigue with minimal activity. No pain or excessive dyspnea. Complaining about having to wear C Pap. Does not feel he is getting an improved complement of sleep and actually that he does better off C Pap.  He has no exertional tolerance. Denies depression. Unable to do things that bring quality to life. Physical activities initial ER lead to excessive exhaustion. He is short of breath when he lies down. Recent functional testing demonstrated EF of 50%, intermediate risk study due to perfusion defects in the anterior and inferior walls. Previous heart catheterization demonstrated a totally occluded diagonal (likely source of anterior wall defect) and high-grade stenosis in circumflex PDA (accounting for the inferolateral defect).  Past Medical History:  Diagnosis Date  . CAD (coronary artery disease)   . Diabetes ([place])   . Hyperlipidemia   . Obesity (BMI 30-39.9) 06/18/2015  . OSA (obstructive sleep apnea)    Severe with AHI 27/hr now on CPAP  . Pure hypercholesterolemia   . RBBB   . Type II or unspecified type diabetes mellitus without mention of complication, not stated as uncontrolled   . Unspecified essential hypertension     Past Surgical History:  Procedure Laterality Date  . ANTERIOR CRUCIATE LIGAMENT REPAIR    . CARPAL TUNNEL RELEASE    . VEIN SURGERY      Current Medications: Outpatient Medications Prior to Visit  Medication Sig Dispense Refill  . ACCU-CHEK AVIVA PLUS test strip USE AS INSTRUCTED TO  CHECK BLOOD SUGAR TWICE A DAY 200 each 2  . aspirin 81 MG tablet Take 81 mg by mouth daily.    . B-D INS SYR ULTRAFINE 1CC/31G 31G X 5/16" 1 ML MISC USE TO INJECT INSULIN DAILY 100 each 3  . B-D UF III MINI PEN NEEDLES 31G X 5 MM MISC USE 1 PEN NEEDLE PER DAY 100 each 3  . ferrous sulfate 325 (65 FE) MG tablet Take 325 mg by mouth daily with breakfast.    . fish oil-omega-3 fatty acids 1000 MG capsule Take 2 g by mouth daily.    . furosemide (LASIX) 40 MG tablet Take 40 mg by mouth daily.    [name] glimepiride (AMARYL) 2 MG tablet Take 0.5 tablets (1 mg total) by mouth daily. 45 tablet 1  . ibuprofen (ADVIL,MOTRIN) 200 MG tablet Take 200 mg by mouth every 6 (six) hours as needed for headache or moderate pain.     [name] insulin lispro (HUMALOG KWIKPEN) 100 UNIT/ML KiwkPen 4-8 units before the main meals of the day 15 mL 1  . insulin NPH Human (HUMULIN N,NOVOLIN N) 100 UNIT/ML injection Inject 0.22 mLs (22 Units total) into the skin at bedtime. Uses Vial 20 mL 1  . Liraglutide (VICTOZA) 18 MG/3ML SOPN Inject 0.3 mLs (1.8 mg total) into the skin daily. 9 pen 2  . Multiple Vitamin (MULTIVITAMIN WITH MINERALS) TABS tablet Take 1 tablet by mouth daily.    . Multiple Vitamins-Minerals (PRESERVISION/LUTEIN) CAPS Take 1 capsule  by mouth daily.     [name] olmesartan (BENICAR) 40 MG tablet Take 1 tablet (40 mg total) by mouth daily. 90 tablet 3  . pravastatin (PRAVACHOL) 80 MG tablet TAKE 1 TABLET DAILY (DOSE INCREASE) 90 tablet 1  . sodium chloride (OCEAN) 0.65 % SOLN nasal spray Place 2 sprays into both nostrils 2 (two) times daily.  0  . spironolactone (ALDACTONE) 25 MG tablet Take 1 tablet (25 mg total) by mouth daily. 90 tablet 3  . tamsulosin (FLOMAX) 0.4 MG CAPS capsule Take 1 capsule (0.4 mg total) by mouth daily. 90 capsule 2  . amLODipine (NORVASC) 10 MG tablet Take 1 tablet (10 mg total) by mouth daily. (Patient not taking: Reported on 12/22/2016) 90 tablet 3  . metFORMIN (GLUMETZA) 500 MG (MOD) 24 hr tablet  Take 1 tablet (500 mg total) by mouth QID. (Patient not taking: Reported on 12/22/2016) 400 tablet 1   No facility-administered medications prior to visit.      Allergies:   Morphine and related   Social History   Social History  . Marital status: Married    Spouse name: N/A  . Number of children: N/A  . Years of education: N/A   Social History Main Topics  . Smoking status: Never Smoker  . Smokeless tobacco: Never Used  . Alcohol use None  . Drug use: Unknown  . Sexual activity: Not Asked   Other Topics Concern  . None   Social History Narrative  . None     Family History:  The patient's family history includes Cancer in his sister; Emphysema in his father; Healthy in his sister; Heart failure in his father; Hypertension in his mother.   ROS:   Please see the history of present illness.    Excessive fatigue, shortness of breath, difficulty sleeping.  All other systems reviewed and are negative.   PHYSICAL EXAM:   VS:  BP 140/68 (BP Location: Left Arm)   Pulse 60   Ht 5\' 9"  (1.753 m)   Wt 241 lb (109.3 kg)   BMI 35.59 kg/m    GEN: Well nourished, well developed, in no acute distress  HEENT: normal  Neck: no JVD, carotid bruits, or masses Cardiac: RRR; 2/6 systolic murmur of aortic stenosis. No rubs, or gallops. There is 2+ bilateral lower extremity edema. Respiratory:  clear to auscultation bilaterally, normal work of breathing GI: soft, nontender, nondistended, + BS MS: no deformity or atrophy  Skin: warm and dry, no rash Neuro:  Alert and Oriented x 3, Strength and sensation are intact Psych: euthymic mood, full affect  Wt Readings from Last 3 Encounters:  12/22/16 241 lb (109.3 kg)  12/16/16 234 lb (106.1 kg)  11/02/16 234 lb (106.1 kg)      Studies/Labs Reviewed:   EKG:  EKG  Not repeated  Recent Labs: 07/27/2016: TSH 0.82 10/21/2016: NT-Pro BNP 399 10/29/2016: ALT 14; Hemoglobin 11.8; Platelets 180.0 12/08/2016: BUN 38; Creatinine, Ser 1.21;  Potassium 4.5; Sodium 132   Lipid Panel    Component Value Date/Time   CHOL 180 10/29/2016 0911   TRIG 155.0 (H) 10/29/2016 0911   HDL 54.50 10/29/2016 0911   CHOLHDL 3 10/29/2016 0911   VLDL 31.0 10/29/2016 0911   LDLCALC 94 10/29/2016 0911   LDLDIRECT 101.0 04/26/2016 0915    Additional studies/ records that were reviewed today include:  Myocardial perfusion imaging 10/26/16: Study Highlights     The left ventricular ejection fraction is mildly decreased (45-54%).  Nuclear stress EF: 50%.  There was no ST segment deviation noted during stress.  Findings consistent with ischemia.  This is an intermediate risk study.   1. EF 50% with mild septal hypokinesis.  2. Medium-sized, partially reversible mid anterior/anteroseptal and apical anterior/septal perfusion defect.  This is concerning for infarction with significant peri-infarct ischemia.  3. Intermediate risk study.    2-D Doppler echocardiogram 10/01/16: ------------------------------------------------------------------- Study Conclusions  - Left ventricle: The cavity size was normal. Wall thickness was   increased in a pattern of mild LVH. Systolic function was normal.   The estimated ejection fraction was in the range of 55% to 60%.   Wall motion was normal; there were no regional wall motion   abnormalities. Doppler parameters are consistent with abnormal   left ventricular relaxation (grade 1 diastolic dysfunction). - Aortic valve: Moderately calcified annulus. Moderately thickened,   moderately calcified leaflets. There was moderate stenosis. Valve   area (VTI): 1.28 cm^2. Valve area (Vmax): 1.48 cm^2. Valve area   (Vmean): 1.34 cm^2. - Mitral valve: Valve area by continuity equation (using LVOT   flow): 2.41 cm^2.   Coronary arteriogram, 2013: IMPRESSIONS:  1. Total occlusion of the first diagonal. 90% stenosis of the left circumflex PDA.  2. Nondominant diffusely diseased right coronary  3. Low  normal left ventricular function with EF of 50% and mild anterior wall hypokinesis.  ASSESSMENT:    1. Nonrheumatic aortic valve stenosis   2. Coronary artery disease involving native coronary artery of native heart with angina pectoris ([place])   3. Chronic diastolic heart failure ([place])   4. Essential hypertension, benign   5. RBBB      PLAN:  In order of problems listed above:  1. Moderate aortic stenosis. He will undergo left and right heart catheterization with coronary angiography to assess hemodynamics and exclude the possibility that we are missing significant aortic stenosis as the source of his exertional fatigue. Holter has not demonstrated any evidence of chronotropic incompetence. 2. Known coronary disease by catheterization 5 years ago. Abnormal nuclear with anterolateral ischemia that is likely related to known chronic total occlusion of the diagonal. Coronary angina to exclude progression of coronary disease 6 to be contribute to his current complaints. 3. Left and right heart catheterization to evaluate hemodynamics and help guide therapy  The patient was counseled to undergo left heart catheterization, coronary angiography, and possible percutaneous coronary intervention with stent implantation. The procedural risks and benefits were discussed in detail. The risks discussed included death, stroke, myocardial infarction, life-threatening bleeding, limb ischemia, kidney injury, allergy, and possible emergency cardiac surgery. The risk of these significant complications were estimated to occur less than 1% of the time. After discussion, the patient has agreed to proceed.    Medication Adjustments/Labs and Tests Ordered: Current medicines are reviewed at length with the patient today.  Concerns regarding medicines are outlined above.  Medication changes, Labs and Tests ordered today are listed in the Patient Instructions below. There are no Patient Instructions on file for this  visit.   Signed, Sinclair Grooms, MD  12/22/2016 10:00 AM    [facility] [place], [place], [place]  [address] Phone: [phone]; Fax: [phone]

## 2016-12-24 ENCOUNTER — Other Ambulatory Visit: Payer: Self-pay | Admitting: Endocrinology

## 2016-12-28 ENCOUNTER — Ambulatory Visit (HOSPITAL_COMMUNITY)
Admission: RE | Admit: 2016-12-28 | Discharge: 2016-12-28 | Disposition: A | Discharge status: Home / Self Care | Payer: Medicare Other | Source: Ambulatory Visit | Attending: Interventional Cardiology | Admitting: Interventional Cardiology

## 2016-12-28 ENCOUNTER — Encounter (HOSPITAL_COMMUNITY)
Admission: RE | Disposition: A | Discharge status: Home / Self Care | Payer: Self-pay | Source: Ambulatory Visit | Attending: Interventional Cardiology

## 2016-12-28 DIAGNOSIS — E669 Obesity, unspecified: Secondary | ICD-10-CM | POA: Diagnosis not present

## 2016-12-28 DIAGNOSIS — Z7982 Long term (current) use of aspirin: Secondary | ICD-10-CM | POA: Insufficient documentation

## 2016-12-28 DIAGNOSIS — I4510 Unspecified right bundle-branch block: Secondary | ICD-10-CM | POA: Insufficient documentation

## 2016-12-28 DIAGNOSIS — R0609 Other forms of dyspnea: Secondary | ICD-10-CM

## 2016-12-28 DIAGNOSIS — I2582 Chronic total occlusion of coronary artery: Secondary | ICD-10-CM | POA: Diagnosis not present

## 2016-12-28 DIAGNOSIS — G4733 Obstructive sleep apnea (adult) (pediatric): Secondary | ICD-10-CM | POA: Diagnosis not present

## 2016-12-28 DIAGNOSIS — I5033 Acute on chronic diastolic (congestive) heart failure: Secondary | ICD-10-CM | POA: Diagnosis present

## 2016-12-28 DIAGNOSIS — I5032 Chronic diastolic (congestive) heart failure: Secondary | ICD-10-CM | POA: Insufficient documentation

## 2016-12-28 DIAGNOSIS — Z8249 Family history of ischemic heart disease and other diseases of the circulatory system: Secondary | ICD-10-CM | POA: Diagnosis not present

## 2016-12-28 DIAGNOSIS — I2584 Coronary atherosclerosis due to calcified coronary lesion: Secondary | ICD-10-CM | POA: Diagnosis not present

## 2016-12-28 DIAGNOSIS — I350 Nonrheumatic aortic (valve) stenosis: Secondary | ICD-10-CM | POA: Insufficient documentation

## 2016-12-28 DIAGNOSIS — E119 Type 2 diabetes mellitus without complications: Secondary | ICD-10-CM | POA: Insufficient documentation

## 2016-12-28 DIAGNOSIS — I110 Hypertensive heart disease with heart failure: Secondary | ICD-10-CM | POA: Insufficient documentation

## 2016-12-28 DIAGNOSIS — Z6835 Body mass index (BMI) 35.0-35.9, adult: Secondary | ICD-10-CM | POA: Insufficient documentation

## 2016-12-28 DIAGNOSIS — I2510 Atherosclerotic heart disease of native coronary artery without angina pectoris: Secondary | ICD-10-CM | POA: Insufficient documentation

## 2016-12-28 DIAGNOSIS — I10 Essential (primary) hypertension: Secondary | ICD-10-CM | POA: Diagnosis present

## 2016-12-28 DIAGNOSIS — E7800 Pure hypercholesterolemia, unspecified: Secondary | ICD-10-CM | POA: Insufficient documentation

## 2016-12-28 DIAGNOSIS — Z794 Long term (current) use of insulin: Secondary | ICD-10-CM | POA: Diagnosis not present

## 2016-12-28 HISTORY — PX: RIGHT/LEFT HEART CATH AND CORONARY ANGIOGRAPHY: CATH118266

## 2016-12-28 LAB — POCT I-STAT 3, VENOUS BLOOD GAS (G3P V)
Acid-base deficit: 1 mmol/L (ref 0.0–2.0)
BICARBONATE: 25.5 mmol/L (ref 20.0–28.0)
Bicarbonate: 24 mmol/L (ref 20.0–28.0)
O2 SAT: 68 %
O2 Saturation: 98 %
PCO2 VEN: 41.6 mmHg — AB (ref 44.0–60.0)
TCO2: 25 mmol/L (ref 0–100)
TCO2: 27 mmol/L (ref 0–100)
pCO2, Ven: 43.3 mmHg — ABNORMAL LOW (ref 44.0–60.0)
pH, Ven: 7.369 (ref 7.250–7.430)
pH, Ven: 7.378 (ref 7.250–7.430)
pO2, Ven: 108 mmHg — ABNORMAL HIGH (ref 32.0–45.0)
pO2, Ven: 37 mmHg (ref 32.0–45.0)

## 2016-12-28 LAB — GLUCOSE, CAPILLARY: Glucose-Capillary: 161 mg/dL — ABNORMAL HIGH (ref 65–99)

## 2016-12-28 SURGERY — RIGHT/LEFT HEART CATH AND CORONARY ANGIOGRAPHY
Anesthesia: LOCAL

## 2016-12-28 MED ORDER — SODIUM CHLORIDE 0.9% FLUSH: 3.0000 mL | INTRAVENOUS | Status: DC | PRN

## 2016-12-28 MED ORDER — IOPAMIDOL (ISOVUE-370) INJECTION 76%
INTRAVENOUS | Status: DC | PRN
  Administered 2016-12-28: 110 mL via INTRA_ARTERIAL

## 2016-12-28 MED ORDER — FENTANYL CITRATE (PF) 100 MCG/2ML IJ SOLN
INTRAMUSCULAR | Status: AC
  Filled 2016-12-28: qty 2

## 2016-12-28 MED ORDER — HEPARIN SODIUM (PORCINE) 1000 UNIT/ML IJ SOLN
INTRAMUSCULAR | Status: AC
  Filled 2016-12-28: qty 1

## 2016-12-28 MED ORDER — ASPIRIN 81 MG PO CHEW: 81.0000 mg | CHEWABLE_TABLET | ORAL | Status: DC

## 2016-12-28 MED ORDER — SODIUM CHLORIDE 0.9 % IV SOLN: 250.0000 mL | INTRAVENOUS | Status: DC | PRN

## 2016-12-28 MED ORDER — FENTANYL CITRATE (PF) 100 MCG/2ML IJ SOLN
INTRAMUSCULAR | Status: DC | PRN
  Administered 2016-12-28: 50 ug via INTRAVENOUS

## 2016-12-28 MED ORDER — SODIUM CHLORIDE 0.9% FLUSH: 3.0000 mL | Freq: Two times a day (BID) | INTRAVENOUS | Status: DC

## 2016-12-28 MED ORDER — LIDOCAINE HCL (PF) 1 % IJ SOLN
INTRAMUSCULAR | Status: AC
  Filled 2016-12-28: qty 30

## 2016-12-28 MED ORDER — MIDAZOLAM HCL 2 MG/2ML IJ SOLN
INTRAMUSCULAR | Status: AC
  Filled 2016-12-28: qty 2

## 2016-12-28 MED ORDER — LIDOCAINE HCL (PF) 1 % IJ SOLN
INTRAMUSCULAR | Status: DC | PRN
  Administered 2016-12-28: 5 mL

## 2016-12-28 MED ORDER — SODIUM CHLORIDE 0.9% FLUSH
3.0000 mL | INTRAVENOUS | Status: DC | PRN
Start: 2016-12-28 — End: 2016-12-28

## 2016-12-28 MED ORDER — OXYCODONE-ACETAMINOPHEN 5-325 MG PO TABS: 1.0000 | ORAL_TABLET | ORAL | Status: DC | PRN

## 2016-12-28 MED ORDER — SODIUM CHLORIDE 0.9 % WEIGHT BASED INFUSION: 1.0000 mL/kg/h | INTRAVENOUS | Status: DC

## 2016-12-28 MED ORDER — HEPARIN (PORCINE) IN NACL 2-0.9 UNIT/ML-% IJ SOLN
INTRAMUSCULAR | Status: AC
  Filled 2016-12-28: qty 1000

## 2016-12-28 MED ORDER — ACETAMINOPHEN 325 MG PO TABS: 650.0000 mg | ORAL_TABLET | ORAL | Status: DC | PRN

## 2016-12-28 MED ORDER — ASPIRIN 81 MG PO CHEW: 81.0000 mg | CHEWABLE_TABLET | Freq: Every day | ORAL | Status: DC

## 2016-12-28 MED ORDER — VERAPAMIL HCL 2.5 MG/ML IV SOLN
INTRAVENOUS | Status: AC
  Filled 2016-12-28: qty 2

## 2016-12-28 MED ORDER — HEPARIN (PORCINE) IN NACL 2-0.9 UNIT/ML-% IJ SOLN
INTRAMUSCULAR | Status: DC | PRN
  Administered 2016-12-28: 1000 mL via INTRA_ARTERIAL

## 2016-12-28 MED ORDER — SODIUM CHLORIDE 0.9 % IV SOLN
INTRAVENOUS | Status: AC
  Administered 2016-12-28: 14:00:00 via INTRAVENOUS

## 2016-12-28 MED ORDER — HEPARIN SODIUM (PORCINE) 1000 UNIT/ML IJ SOLN
INTRAMUSCULAR | Status: DC | PRN
  Administered 2016-12-28: 5000 [IU] via INTRAVENOUS

## 2016-12-28 MED ORDER — SODIUM CHLORIDE 0.9 % WEIGHT BASED INFUSION
3.0000 mL/kg/h | INTRAVENOUS | Status: DC
  Administered 2016-12-28: 3 mL/kg/h via INTRAVENOUS

## 2016-12-28 MED ORDER — ONDANSETRON HCL 4 MG/2ML IJ SOLN: 4.0000 mg | Freq: Four times a day (QID) | INTRAMUSCULAR | Status: DC | PRN

## 2016-12-28 MED ORDER — MIDAZOLAM HCL 2 MG/2ML IJ SOLN
INTRAMUSCULAR | Status: DC | PRN
  Administered 2016-12-28: 1 mg via INTRAVENOUS

## 2016-12-28 MED ORDER — HEPARIN (PORCINE) IN NACL 2-0.9 UNIT/ML-% IJ SOLN
INTRAMUSCULAR | Status: DC | PRN
  Administered 2016-12-28: 10:00:00 via INTRA_ARTERIAL

## 2016-12-28 SURGICAL SUPPLY — 21 items
CATH 5FR JL3.5 JR4 ANG PIG MP (CATHETERS) ×1 IMPLANT
CATH BALLN WEDGE 5F 110CM (CATHETERS) ×1 IMPLANT
CATH INFINITI 5 FR 3DRC (CATHETERS) ×1 IMPLANT
CATH INFINITI 5FR AL1 (CATHETERS) ×1 IMPLANT
CATH INFINITI 5FR MPB2 (CATHETERS) ×1 IMPLANT
CATH LAUNCHER 5F RADR (CATHETERS) IMPLANT
CATHETER LAUNCHER 5F RADR (CATHETERS) ×2
COVER PRB 48X5XTLSCP FOLD TPE (BAG) IMPLANT
COVER PROBE 5X48 (BAG) ×2
DEVICE RAD COMP TR BAND LRG (VASCULAR PRODUCTS) ×1 IMPLANT
GLIDESHEATH SLEND A-KIT 6F 22G (SHEATH) ×1 IMPLANT
GLIDESHEATH SLEND SS 6F .021 (SHEATH) IMPLANT
GUIDEWIRE .025 260CM (WIRE) ×1 IMPLANT
GUIDEWIRE INQWIRE 1.5J.035X260 (WIRE) IMPLANT
INQWIRE 1.5J .035X260CM (WIRE) ×2
KIT HEART LEFT (KITS) ×2 IMPLANT
PACK CARDIAC CATHETERIZATION (CUSTOM PROCEDURE TRAY) ×2 IMPLANT
SHEATH GLIDE SLENDER 4/5FR (SHEATH) ×1 IMPLANT
TRANSDUCER W/STOPCOCK (MISCELLANEOUS) ×2 IMPLANT
TUBING CIL FLEX 10 FLL-RA (TUBING) ×2 IMPLANT
WIRE EMERALD ST .035X260CM (WIRE) ×1 IMPLANT

## 2016-12-28 NOTE — Discharge Instructions (Signed)
Hold metformin until Friday.  Radial Site Care Refer to this sheet in the next few weeks. These instructions provide you with information about caring for yourself after your procedure. Your health care provider may also give you more specific instructions. Your treatment has been planned according to current medical practices, but problems sometimes occur. Call your health care provider if you have any problems or questions after your procedure. What can I expect after the procedure? After your procedure, it is typical to have the following:  Bruising at the radial site that usually fades within 1-2 weeks.  Blood collecting in the tissue (hematoma) that may be painful to the touch. It should usually decrease in size and tenderness within 1-2 weeks. Follow these instructions at home:  Take medicines only as directed by your health care provider.  You may shower 24-48 hours after the procedure or as directed by your health care provider. Remove the bandage (dressing) and gently wash the site with plain soap and water. Pat the area dry with a clean towel. Do not rub the site, because this may cause bleeding.  Do not take baths, swim, or use a hot tub until your health care provider approves.  Check your insertion site every day for redness, swelling, or drainage.  Do not apply powder or lotion to the site.  Do not flex or bend the affected arm for 24 hours or as directed by your health care provider.  Do not push or pull heavy objects with the affected arm for 24 hours or as directed by your health care provider.  Do not lift over 10 lb (4.5 kg) for 5 days after your procedure or as directed by your health care provider.  Ask your health care provider when it is okay to:  Return to work or school.  Resume usual physical activities or sports.  Resume sexual activity.  Do not drive home if you are discharged the same day as the procedure. Have someone else drive you.  You may drive  24 hours after the procedure unless otherwise instructed by your health care provider.  Do not operate machinery or power tools for 24 hours after the procedure.  If your procedure was done as an outpatient procedure, which means that you went home the same day as your procedure, a responsible adult should be with you for the first 24 hours after you arrive home.  Keep all follow-up visits as directed by your health care provider. This is important. Contact a health care provider if:  You have a fever.  You have chills.  You have increased bleeding from the radial site. Hold pressure on the site. Get help right away if:  You have unusual pain at the radial site.  You have redness, warmth, or swelling at the radial site.  You have drainage (other than a small amount of blood on the dressing) from the radial site.  The radial site is bleeding, and the bleeding does not stop after 30 minutes of holding steady pressure on the site.  Your arm or hand becomes pale, cool, tingly, or numb. This information is not intended to replace advice given to you by your health care provider. Make sure you discuss any questions you have with your health care provider. Document Released: 09/25/2010 Document Revised: 01/29/2016 Document Reviewed: 03/11/2014 Elsevier Interactive Patient Education  2017 [employer].

## 2016-12-28 NOTE — H&P (View-Only) (Signed)
Cardiology Office Note    Date:  12/22/2016   ID:  Todd Romero, DOB 03/27/1934, MRN [id number]  PCP:  [name], MD  Cardiologist: Sinclair Grooms, MD   Chief Complaint  Patient presents with  . Coronary Artery Disease    History of Present Illness:  Todd Romero is a 83 y.o. male with a history of mod aortic stenosis, chronic diastolic heart failure, CAD with CTO of D1 and 90% LCX (cath 2013), OSA on CPAP, HTN, type 2 diabetes, and hyperlipidemia.  Fatigue with minimal activity. No pain or excessive dyspnea. Complaining about having to wear C Pap. Does not feel he is getting an improved complement of sleep and actually that he does better off C Pap.  He has no exertional tolerance. Denies depression. Unable to do things that bring quality to life. Physical activities initial ER lead to excessive exhaustion. He is short of breath when he lies down. Recent functional testing demonstrated EF of 50%, intermediate risk study due to perfusion defects in the anterior and inferior walls. Previous heart catheterization demonstrated a totally occluded diagonal (likely source of anterior wall defect) and high-grade stenosis in circumflex PDA (accounting for the inferolateral defect).  Past Medical History:  Diagnosis Date  . CAD (coronary artery disease)   . Diabetes ([place])   . Hyperlipidemia   . Obesity (BMI 30-39.9) 06/18/2015  . OSA (obstructive sleep apnea)    Severe with AHI 27/hr now on CPAP  . Pure hypercholesterolemia   . RBBB   . Type II or unspecified type diabetes mellitus without mention of complication, not stated as uncontrolled   . Unspecified essential hypertension     Past Surgical History:  Procedure Laterality Date  . ANTERIOR CRUCIATE LIGAMENT REPAIR    . CARPAL TUNNEL RELEASE    . VEIN SURGERY      Current Medications: Outpatient Medications Prior to Visit  Medication Sig Dispense Refill  . ACCU-CHEK AVIVA PLUS test strip USE AS INSTRUCTED TO  CHECK BLOOD SUGAR TWICE A DAY 200 each 2  . aspirin 81 MG tablet Take 81 mg by mouth daily.    . B-D INS SYR ULTRAFINE 1CC/31G 31G X 5/16" 1 ML MISC USE TO INJECT INSULIN DAILY 100 each 3  . B-D UF III MINI PEN NEEDLES 31G X 5 MM MISC USE 1 PEN NEEDLE PER DAY 100 each 3  . ferrous sulfate 325 (65 FE) MG tablet Take 325 mg by mouth daily with breakfast.    . fish oil-omega-3 fatty acids 1000 MG capsule Take 2 g by mouth daily.    . furosemide (LASIX) 40 MG tablet Take 40 mg by mouth daily.    [name] glimepiride (AMARYL) 2 MG tablet Take 0.5 tablets (1 mg total) by mouth daily. 45 tablet 1  . ibuprofen (ADVIL,MOTRIN) 200 MG tablet Take 200 mg by mouth every 6 (six) hours as needed for headache or moderate pain.     [name] insulin lispro (HUMALOG KWIKPEN) 100 UNIT/ML KiwkPen 4-8 units before the main meals of the day 15 mL 1  . insulin NPH Human (HUMULIN N,NOVOLIN N) 100 UNIT/ML injection Inject 0.22 mLs (22 Units total) into the skin at bedtime. Uses Vial 20 mL 1  . Liraglutide (VICTOZA) 18 MG/3ML SOPN Inject 0.3 mLs (1.8 mg total) into the skin daily. 9 pen 2  . Multiple Vitamin (MULTIVITAMIN WITH MINERALS) TABS tablet Take 1 tablet by mouth daily.    . Multiple Vitamins-Minerals (PRESERVISION/LUTEIN) CAPS Take 1 capsule  by mouth daily.     [name] olmesartan (BENICAR) 40 MG tablet Take 1 tablet (40 mg total) by mouth daily. 90 tablet 3  . pravastatin (PRAVACHOL) 80 MG tablet TAKE 1 TABLET DAILY (DOSE INCREASE) 90 tablet 1  . sodium chloride (OCEAN) 0.65 % SOLN nasal spray Place 2 sprays into both nostrils 2 (two) times daily.  0  . spironolactone (ALDACTONE) 25 MG tablet Take 1 tablet (25 mg total) by mouth daily. 90 tablet 3  . tamsulosin (FLOMAX) 0.4 MG CAPS capsule Take 1 capsule (0.4 mg total) by mouth daily. 90 capsule 2  . amLODipine (NORVASC) 10 MG tablet Take 1 tablet (10 mg total) by mouth daily. (Patient not taking: Reported on 12/22/2016) 90 tablet 3  . metFORMIN (GLUMETZA) 500 MG (MOD) 24 hr tablet  Take 1 tablet (500 mg total) by mouth QID. (Patient not taking: Reported on 12/22/2016) 400 tablet 1   No facility-administered medications prior to visit.      Allergies:   Morphine and related   Social History   Social History  . Marital status: Married    Spouse name: N/A  . Number of children: N/A  . Years of education: N/A   Social History Main Topics  . Smoking status: Never Smoker  . Smokeless tobacco: Never Used  . Alcohol use None  . Drug use: Unknown  . Sexual activity: Not Asked   Other Topics Concern  . None   Social History Narrative  . None     Family History:  The patient's family history includes Cancer in his sister; Emphysema in his father; Healthy in his sister; Heart failure in his father; Hypertension in his mother.   ROS:   Please see the history of present illness.    Excessive fatigue, shortness of breath, difficulty sleeping.  All other systems reviewed and are negative.   PHYSICAL EXAM:   VS:  BP 140/68 (BP Location: Left Arm)   Pulse 60   Ht 5\' 9"  (1.753 m)   Wt 241 lb (109.3 kg)   BMI 35.59 kg/m    GEN: Well nourished, well developed, in no acute distress  HEENT: normal  Neck: no JVD, carotid bruits, or masses Cardiac: RRR; 2/6 systolic murmur of aortic stenosis. No rubs, or gallops. There is 2+ bilateral lower extremity edema. Respiratory:  clear to auscultation bilaterally, normal work of breathing GI: soft, nontender, nondistended, + BS MS: no deformity or atrophy  Skin: warm and dry, no rash Neuro:  Alert and Oriented x 3, Strength and sensation are intact Psych: euthymic mood, full affect  Wt Readings from Last 3 Encounters:  12/22/16 241 lb (109.3 kg)  12/16/16 234 lb (106.1 kg)  11/02/16 234 lb (106.1 kg)      Studies/Labs Reviewed:   EKG:  EKG  Not repeated  Recent Labs: 07/27/2016: TSH 0.82 10/21/2016: NT-Pro BNP 399 10/29/2016: ALT 14; Hemoglobin 11.8; Platelets 180.0 12/08/2016: BUN 38; Creatinine, Ser 1.21;  Potassium 4.5; Sodium 132   Lipid Panel    Component Value Date/Time   CHOL 180 10/29/2016 0911   TRIG 155.0 (H) 10/29/2016 0911   HDL 54.50 10/29/2016 0911   CHOLHDL 3 10/29/2016 0911   VLDL 31.0 10/29/2016 0911   LDLCALC 94 10/29/2016 0911   LDLDIRECT 101.0 04/26/2016 0915    Additional studies/ records that were reviewed today include:  Myocardial perfusion imaging 10/26/16: Study Highlights     The left ventricular ejection fraction is mildly decreased (45-54%).  Nuclear stress EF: 50%.  There was no ST segment deviation noted during stress.  Findings consistent with ischemia.  This is an intermediate risk study.   1. EF 50% with mild septal hypokinesis.  2. Medium-sized, partially reversible mid anterior/anteroseptal and apical anterior/septal perfusion defect.  This is concerning for infarction with significant peri-infarct ischemia.  3. Intermediate risk study.    2-D Doppler echocardiogram 10/01/16: ------------------------------------------------------------------- Study Conclusions  - Left ventricle: The cavity size was normal. Wall thickness was   increased in a pattern of mild LVH. Systolic function was normal.   The estimated ejection fraction was in the range of 55% to 60%.   Wall motion was normal; there were no regional wall motion   abnormalities. Doppler parameters are consistent with abnormal   left ventricular relaxation (grade 1 diastolic dysfunction). - Aortic valve: Moderately calcified annulus. Moderately thickened,   moderately calcified leaflets. There was moderate stenosis. Valve   area (VTI): 1.28 cm^2. Valve area (Vmax): 1.48 cm^2. Valve area   (Vmean): 1.34 cm^2. - Mitral valve: Valve area by continuity equation (using LVOT   flow): 2.41 cm^2.   Coronary arteriogram, 2013: IMPRESSIONS:  1. Total occlusion of the first diagonal. 90% stenosis of the left circumflex PDA.  2. Nondominant diffusely diseased right coronary  3. Low  normal left ventricular function with EF of 50% and mild anterior wall hypokinesis.  ASSESSMENT:    1. Nonrheumatic aortic valve stenosis   2. Coronary artery disease involving native coronary artery of native heart with angina pectoris ([place])   3. Chronic diastolic heart failure ([place])   4. Essential hypertension, benign   5. RBBB      PLAN:  In order of problems listed above:  1. Moderate aortic stenosis. He will undergo left and right heart catheterization with coronary angiography to assess hemodynamics and exclude the possibility that we are missing significant aortic stenosis as the source of his exertional fatigue. Holter has not demonstrated any evidence of chronotropic incompetence. 2. Known coronary disease by catheterization 5 years ago. Abnormal nuclear with anterolateral ischemia that is likely related to known chronic total occlusion of the diagonal. Coronary angina to exclude progression of coronary disease 6 to be contribute to his current complaints. 3. Left and right heart catheterization to evaluate hemodynamics and help guide therapy  The patient was counseled to undergo left heart catheterization, coronary angiography, and possible percutaneous coronary intervention with stent implantation. The procedural risks and benefits were discussed in detail. The risks discussed included death, stroke, myocardial infarction, life-threatening bleeding, limb ischemia, kidney injury, allergy, and possible emergency cardiac surgery. The risk of these significant complications were estimated to occur less than 1% of the time. After discussion, the patient has agreed to proceed.    Medication Adjustments/Labs and Tests Ordered: Current medicines are reviewed at length with the patient today.  Concerns regarding medicines are outlined above.  Medication changes, Labs and Tests ordered today are listed in the Patient Instructions below. There are no Patient Instructions on file for this  visit.   Signed, Sinclair Grooms, MD  12/22/2016 10:00 AM    [facility] [place], [place], [place]  [address] Phone: [phone]; Fax: [phone]

## 2016-12-28 NOTE — Interval H&P Note (Signed)
Cath Lab Visit (complete for each Cath Lab visit)  Clinical Evaluation Leading to the Procedure:   ACS: No.  Non-ACS:    Anginal Classification: CCS Romero  Anti-ischemic medical therapy: Minimal Therapy (1 class of medications)  Non-Invasive Test Results: No non-invasive testing performed  Prior CABG: No previous CABG      History and Physical Interval Note:  12/28/2016 7:13 AM  Todd Romero  has presented today for surgery, with the diagnosis of cad - chf  The various methods of treatment have been discussed with the patient and family. After consideration of risks, benefits and other options for treatment, the patient has consented to  Procedure(s): Right/Left Heart Cath and Coronary Angiography (N/A) as a surgical intervention .  The patient's history has been reviewed, patient examined, no change in status, stable for surgery.  I have reviewed the patient's chart and labs.  Questions were answered to the patient's satisfaction.     Todd Romero

## 2016-12-29 ENCOUNTER — Encounter (HOSPITAL_COMMUNITY): Payer: Self-pay | Admitting: Interventional Cardiology

## 2016-12-29 ENCOUNTER — Other Ambulatory Visit: Payer: Self-pay | Admitting: Endocrinology

## 2016-12-30 ENCOUNTER — Telehealth: Payer: Self-pay | Admitting: *Deleted

## 2016-12-30 NOTE — Telephone Encounter (Signed)
-----   Message from Belva Crome, MD sent at 12/29/2016 10:24 AM EDT ----- Anderson Malta, please have Mr. Roland Rack and set up to see Dr. Estevan Ryder in the valve clinic to determine if he has "low gradient" severe aortic stenosis. If so I will like to have him considered for TAVR.  HS

## 2016-12-30 NOTE — Telephone Encounter (Signed)
Left message to call back  

## 2016-12-31 NOTE — Telephone Encounter (Signed)
Patient returning your call, thanks. °

## 2016-12-31 NOTE — Telephone Encounter (Signed)
Spoke with pt and made him aware of recommendation to see Dr. Angelena Form.  Pt verbalized understanding and was in agreement with this plan.  Advised I would send message to Dr. Camillia Herter nurse to get an appt with Dr. Angelena Form.

## 2016-12-31 NOTE — Telephone Encounter (Signed)
I spoke with pt and scheduled him to see Dr. Angelena Form on 01/14/17 at 2:00.

## 2017-01-03 ENCOUNTER — Telehealth: Payer: Self-pay | Admitting: Endocrinology

## 2017-01-03 MED ORDER — OLMESARTAN MEDOXOMIL 40 MG PO TABS: 40.0000 mg | ORAL_TABLET | Freq: Every day | ORAL | 3 refills | Status: DC

## 2017-01-03 NOTE — Telephone Encounter (Signed)
See message and please advise if ok to refill. Rx is listed un der Dr. Daneen Schick. Thanks!

## 2017-01-03 NOTE — Telephone Encounter (Signed)
Refill submitted. 

## 2017-01-03 NOTE — Telephone Encounter (Signed)
Please send a prescription as requested

## 2017-01-03 NOTE — Telephone Encounter (Signed)
Pt needs refills on benocar [facility]

## 2017-01-04 NOTE — Progress Notes (Signed)
Cardiology Office Note    Date:  01/05/2017   ID:  Todd Romero, DOB October 30, 1933, MRN [id number]  PCP:  [name], MD  Cardiologist: Sinclair Grooms, MD   Chief Complaint  Patient presents with  . Congestive Heart Failure    History of Present Illness:  Todd Romero is a 83 y.o. male with a history of mod aortic stenosis, chronic diastolic heart failure, CAD with CTO of D1 and 90% LCX (cath 2013), OSA on CPAP, HTN, type 2 diabetes, and hyperlipidemia.   He is doing okay. He has not said to see Dr. Lauree Chandler for evaluation of aortic stenosis and whether the patient could have a variant of low-gradient severe aortic stenosis. He denies angina. He has exertional fatigue and dyspnea. Recent cardiac catheterization demonstrated patent coronaries with exception of native diagonal and distal circumflex disease. LV function was low-normal. Left heart filling pressures are elevated.  Past Medical History:  Diagnosis Date  . CAD (coronary artery disease)   . Diabetes ([place])   . Hyperlipidemia   . Obesity (BMI 30-39.9) 06/18/2015  . OSA (obstructive sleep apnea)    Severe with AHI 27/hr now on CPAP  . Pure hypercholesterolemia   . RBBB   . Type II or unspecified type diabetes mellitus without mention of complication, not stated as uncontrolled   . Unspecified essential hypertension     Past Surgical History:  Procedure Laterality Date  . ANTERIOR CRUCIATE LIGAMENT REPAIR    . CARPAL TUNNEL RELEASE    . RIGHT/LEFT HEART CATH AND CORONARY ANGIOGRAPHY N/A 12/28/2016   Procedure: Right/Left Heart Cath and Coronary Angiography;  Surgeon: Belva Crome, MD;  Location: [facility];  Service: Cardiovascular;  Laterality: N/A;  . VEIN SURGERY      Current Medications: Outpatient Medications Prior to Visit  Medication Sig Dispense Refill  . ACCU-CHEK AVIVA PLUS test strip USE AS INSTRUCTED TO CHECK BLOOD SUGAR TWICE A DAY 200 each 2  . acetaminophen  (TYLENOL) 500 MG tablet Take 500 mg by mouth every 4 (four) hours as needed for moderate pain or headache.    [name] amLODipine (NORVASC) 10 MG tablet Take 5 mg by mouth daily.    [name] aspirin 81 MG tablet Take 81 mg by mouth daily.    . B-D INS SYR ULTRAFINE 1CC/31G 31G X 5/16" 1 ML MISC USE TO INJECT INSULIN DAILY 100 each 3  . B-D UF III MINI PEN NEEDLES 31G X 5 MM MISC USE 1 PEN NEEDLE PER DAY 100 each 3  . ferrous sulfate 325 (65 FE) MG tablet Take 325 mg by mouth daily with breakfast.    . fish oil-omega-3 fatty acids 1000 MG capsule Take 1 g by mouth daily.     . furosemide (LASIX) 40 MG tablet Take 40 mg by mouth daily at 12 noon.     [name] glimepiride (AMARYL) 2 MG tablet Take 0.5 tablets (1 mg total) by mouth daily. 45 tablet 1  . insulin lispro (HUMALOG KWIKPEN) 100 UNIT/ML KiwkPen 4-8 units before the main meals of the day 15 mL 1  . insulin NPH Human (HUMULIN N,NOVOLIN N) 100 UNIT/ML injection Inject 0.22 mLs (22 Units total) into the skin at bedtime. Uses Vial 20 mL 1  . Liraglutide (VICTOZA) 18 MG/3ML SOPN Inject 0.3 mLs (1.8 mg total) into the skin daily. (Patient taking differently: Inject 1.8 mg into the skin at bedtime. ) 9 pen 2  . metFORMIN (GLUMETZA) 500 MG (MOD)  24 hr tablet Take 500 mg by mouth 2 (two) times daily with a meal.    . Multiple Vitamin (MULTIVITAMIN WITH MINERALS) TABS tablet Take 1 tablet by mouth daily.    . Multiple Vitamins-Minerals (PRESERVISION/LUTEIN) CAPS Take 1 capsule by mouth daily.     [name] olmesartan (BENICAR) 40 MG tablet Take 1 tablet (40 mg total) by mouth daily. 90 tablet 3  . pravastatin (PRAVACHOL) 80 MG tablet TAKE 1 TABLET DAILY (DOSE INCREASE) (Patient taking differently: TAKE 1 TABLET DAILY IN THE EVENING) 90 tablet 1  . sodium chloride (OCEAN) 0.65 % SOLN nasal spray Place 2 sprays into both nostrils 2 (two) times daily. (Patient taking differently: Place 2 sprays into both nostrils 2 (two) times daily as needed for congestion. )  0  . spironolactone  (ALDACTONE) 25 MG tablet Take 1 tablet (25 mg total) by mouth daily. (Patient taking differently: Take 25 mg by mouth daily at 12 noon. ) 90 tablet 3  . tamsulosin (FLOMAX) 0.4 MG CAPS capsule TAKE 1 CAPSULE DAILY 90 capsule 2   No facility-administered medications prior to visit.      Allergies:   Morphine and related   Social History   Social History  . Marital status: Married    Spouse name: N/A  . Number of children: N/A  . Years of education: N/A   Social History Main Topics  . Smoking status: Never Smoker  . Smokeless tobacco: Never Used  . Alcohol use None  . Drug use: Unknown  . Sexual activity: Not Asked   Other Topics Concern  . None   Social History Narrative  . None     Family History:  The patient's family history includes Cancer in his sister; Emphysema in his father; Healthy in his sister; Heart failure in his father; Hypertension in his mother.   ROS:   Please see the history of present illness.    No longer wearing C Pap and feels better.  All other systems reviewed and are negative.   PHYSICAL EXAM:   VS:  BP (!) 150/74 (BP Location: Left Arm)   Pulse 67   Ht 5\' 9"  (1.753 m)   Wt 235 lb (106.6 kg)   BMI 34.70 kg/m    GEN: Well nourished, well developed, in no acute distress  HEENT: normal  Neck: no JVD, carotid bruits, or masses Cardiac: RRR; 5-2/8  systolic murmur, but no rubs, or gallops,no edema . Respiratory:  clear to auscultation bilaterally, normal work of breathing GI: soft, nontender, nondistended, + BS MS: no deformity or atrophy  Skin: warm and dry, no rash Neuro:  Alert and Oriented x 3, Strength and sensation are intact Psych: euthymic mood, full affect  Wt Readings from Last 3 Encounters:  01/05/17 235 lb (106.6 kg)  12/28/16 235 lb (106.6 kg)  12/22/16 241 lb (109.3 kg)      Studies/Labs Reviewed:   EKG:  EKG  Not repeated  Recent Labs: 07/27/2016: TSH 0.82 10/21/2016: NT-Pro BNP 399 10/29/2016: ALT 14; Hemoglobin  11.8 12/22/2016: BUN 22; Creatinine, Ser 0.94; Platelets 198; Potassium 5.2; Sodium 137   Lipid Panel    Component Value Date/Time   CHOL 180 10/29/2016 0911   TRIG 155.0 (H) 10/29/2016 0911   HDL 54.50 10/29/2016 0911   CHOLHDL 3 10/29/2016 0911   VLDL 31.0 10/29/2016 0911   LDLCALC 94 10/29/2016 0911   LDLDIRECT 101.0 04/26/2016 0915    Additional studies/ records that were reviewed today include:  Cardiac catheterization  12/28/16: Conclusion    Difficult procedure due to severe angulation in the right innominate/ascending aortic region that produced catheter resistance to torque and advancement.  Moderate aortic stenosis with a transvalvular gradient of 29 mmHg (mean) and 33-38 mmHg (peak to peak). Calculated aortic valve area 1.35 cm.  Elevated left ventricular end-diastolic pressure and pulmonary capillary wedge pressure. Known LV systolic function with EF greater than 50% by recent echo suggesting chronic diastolic left heart failure.  Heavy three-vessel coronary calcification.  100% stenosis in the mid body of the second diagonal. There is diffuse 30-50% narrowing in the LAD.  The circumflex coronary is dominant. Moderate to heavy calcification is noted throughout the distal vessel and in the proximal segment. There is eccentric 50-90% stenosis proximal to the origin of the L-PDA.  Nondominant right coronary. Not selectively engaged due to poor catheter control. The vessel is patent and supplies only the right ventricle.  RECOMMENDATIONS:   Coronary status is essentially unchanged compared to 5 years ago.  Moderate aortic stenosis based upon hemodynamics obtained from this case. This matches the recent echo.  Explanation for exertional fatigue is uncertain. Consider referral to the valve clinic for a second opinion concerning management of aortic stenosis. May need dobutamine echo as he is potentially a low gradient severe aortic stenosis substrate.       ASSESSMENT:    1. Nonrheumatic aortic valve stenosis   2. Coronary artery disease involving native coronary artery of native heart with angina pectoris ([place])   3. Chronic diastolic heart failure ([place])   4. Essential hypertension, benign   5. RBBB      PLAN:  In order of problems listed above:  1. Referral to the valve clinic, Dr. Estevan Ryder,  to determine if he has low gradient severe aortic stenosis producing his exertional complaints. 2. No progression of coronary disease by recent cath. 3. May need more diuresis is filling pressures were elevated 4. Blood pressure is adequately controlled    Medication Adjustments/Labs and Tests Ordered: Current medicines are reviewed at length with the patient today.  Concerns regarding medicines are outlined above.  Medication changes, Labs and Tests ordered today are listed in the Patient Instructions below. Patient Instructions  Medication Instructions:  None  Labwork: None  Testing/Procedures: None  Follow-Up: Your physician wants you to follow-up in: 4-6 months with Dr. Tamala Julian.  You will receive a reminder letter in the mail two months in advance. If you don't receive a letter, please call our office to schedule the follow-up appointment.   Any Other Special Instructions Will Be Listed Below (If Applicable).     If you need a refill on your cardiac medications before your next appointment, please call your pharmacy.      Signed, Sinclair Grooms, MD  01/05/2017 5:05 PM    [facility] [place], [place], [place]  [address] Phone: [phone]; Fax: [phone]

## 2017-01-05 ENCOUNTER — Ambulatory Visit (INDEPENDENT_AMBULATORY_CARE_PROVIDER_SITE_OTHER): Payer: Medicare Other | Admitting: Interventional Cardiology

## 2017-01-05 ENCOUNTER — Encounter: Payer: Self-pay | Admitting: Interventional Cardiology

## 2017-01-05 VITALS — BP 150/74 | HR 67 | Ht 69.0 in | Wt 235.0 lb

## 2017-01-05 DIAGNOSIS — I350 Nonrheumatic aortic (valve) stenosis: Secondary | ICD-10-CM | POA: Diagnosis not present

## 2017-01-05 DIAGNOSIS — I25119 Atherosclerotic heart disease of native coronary artery with unspecified angina pectoris: Secondary | ICD-10-CM | POA: Diagnosis not present

## 2017-01-05 DIAGNOSIS — I10 Essential (primary) hypertension: Secondary | ICD-10-CM

## 2017-01-05 DIAGNOSIS — I5032 Chronic diastolic (congestive) heart failure: Secondary | ICD-10-CM

## 2017-01-05 DIAGNOSIS — I4510 Unspecified right bundle-branch block: Secondary | ICD-10-CM | POA: Diagnosis not present

## 2017-01-05 NOTE — Patient Instructions (Signed)
Medication Instructions:  None  Labwork: None  Testing/Procedures: None  Follow-Up: Your physician wants you to follow-up in: 4-6 months with Dr. Tamala Julian.  You will receive a reminder letter in the mail two months in advance. If you don't receive a letter, please call our office to schedule the follow-up appointment.   Any Other Special Instructions Will Be Listed Below (If Applicable).     If you need a refill on your cardiac medications before your next appointment, please call your pharmacy.

## 2017-01-14 ENCOUNTER — Ambulatory Visit (INDEPENDENT_AMBULATORY_CARE_PROVIDER_SITE_OTHER): Payer: Medicare Other | Admitting: Cardiovascular Disease

## 2017-01-14 ENCOUNTER — Other Ambulatory Visit: Payer: Self-pay

## 2017-01-14 ENCOUNTER — Encounter (INDEPENDENT_AMBULATORY_CARE_PROVIDER_SITE_OTHER): Payer: Self-pay

## 2017-01-14 ENCOUNTER — Encounter: Payer: Self-pay | Admitting: Cardiovascular Disease

## 2017-01-14 VITALS — BP 144/62 | HR 61 | Ht 69.0 in | Wt 235.8 lb

## 2017-01-14 DIAGNOSIS — I350 Nonrheumatic aortic (valve) stenosis: Secondary | ICD-10-CM

## 2017-01-14 MED ORDER — INSULIN PEN NEEDLE 31G X 5 MM MISC: 3 refills | Status: DC

## 2017-01-14 NOTE — Progress Notes (Signed)
Chief Complaint  Patient presents with  . Fatigue     History of Present Illness: 83 yo male with history of severe aortic valve stenosis, CAD, DM, HLD, sleep apnea, HTN who is here today as a new consult, referred by Dr. Tamala Julian to discuss his aortic valve stenosis. Echo January 2018 with moderately severe AS, mean gradient 28 mmHg, peak gradient 49 mmHg, AVA 1.28 cm2. He has undergone cardiac cath per Dr. Tamala Julian 12/28/16 and was found to have stable moderate CAD. Mean gradient 29 mm Hg and peak gradient 38 mmHg by cath. There is concern for low gradient aortic stenosis as he has continued to have exertional fatigue and dyspnea with minimal exertion. He has no chest pain. Chronic LE edema, no recent changes. He has never smoked. He is retired from Architect work. He is here today with his wife.   Primary Care Physician: [name], MD  Primary cardiologist: Daneen Schick, MD Referring Physician: Daneen Schick, MD   Past Medical History:  Diagnosis Date  . Aortic stenosis   . CAD (coronary artery disease)   . Diabetes ([place])   . Hyperlipidemia   . Obesity (BMI 30-39.9) 06/18/2015  . OSA (obstructive sleep apnea)    Severe with AHI 27/hr now on CPAP  . Pure hypercholesterolemia   . RBBB   . Type II or unspecified type diabetes mellitus without mention of complication, not stated as uncontrolled   . Unspecified essential hypertension     Past Surgical History:  Procedure Laterality Date  . CARPAL TUNNEL RELEASE    . KNEE CARTILAGE SURGERY    . PARTIAL COLECTOMY    . RIGHT/LEFT HEART CATH AND CORONARY ANGIOGRAPHY N/A 12/28/2016   Procedure: Right/Left Heart Cath and Coronary Angiography;  Surgeon: Belva Crome, MD;  Location: [facility];  Service: Cardiovascular;  Laterality: N/A;  . VEIN SURGERY      Current Outpatient Prescriptions  Medication Sig Dispense Refill  . ACCU-CHEK AVIVA PLUS test strip USE AS INSTRUCTED TO CHECK BLOOD SUGAR TWICE A DAY 200 each 2  .  acetaminophen (TYLENOL) 500 MG tablet Take 500 mg by mouth every 4 (four) hours as needed for moderate pain or headache.    [name] amLODipine (NORVASC) 10 MG tablet Take 5 mg by mouth daily.    [name] aspirin 81 MG tablet Take 81 mg by mouth daily.    . B-D INS SYR ULTRAFINE 1CC/31G 31G X 5/16" 1 ML MISC USE TO INJECT INSULIN DAILY 100 each 3  . ferrous sulfate 325 (65 FE) MG tablet Take 325 mg by mouth daily with breakfast.    . fish oil-omega-3 fatty acids 1000 MG capsule Take 1 g by mouth daily.     . furosemide (LASIX) 40 MG tablet Take 40 mg by mouth daily at 12 noon.     [name] glimepiride (AMARYL) 2 MG tablet Take 0.5 tablets (1 mg total) by mouth daily. 45 tablet 1  . insulin lispro (HUMALOG KWIKPEN) 100 UNIT/ML KiwkPen 4-8 units before the main meals of the day 15 mL 1  . insulin NPH Human (HUMULIN N,NOVOLIN N) 100 UNIT/ML injection Inject 0.22 mLs (22 Units total) into the skin at bedtime. Uses Vial 20 mL 1  . Insulin Pen Needle (B-D UF III MINI PEN NEEDLES) 31G X 5 MM MISC USE 2 PEN NEEDLE PER DAY WITH HUMALOG AND VICTOZA 200 each 3  . liraglutide 18 MG/3ML SOPN Inject 1.8 mg into the skin at bedtime.    [name]  metFORMIN (GLUMETZA) 500 MG (MOD) 24 hr tablet Take 500 mg by mouth 2 (two) times daily with a meal.    . Multiple Vitamin (MULTIVITAMIN WITH MINERALS) TABS tablet Take 1 tablet by mouth daily.    . Multiple Vitamins-Minerals (PRESERVISION/LUTEIN) CAPS Take 1 capsule by mouth daily.     [name] olmesartan (BENICAR) 40 MG tablet Take 1 tablet (40 mg total) by mouth daily. 90 tablet 3  . pravastatin (PRAVACHOL) 80 MG tablet Take 80 mg by mouth daily.    . sodium chloride (OCEAN) 0.65 % nasal spray Place 2 sprays into the nose as needed for congestion.    [name] spironolactone (ALDACTONE) 25 MG tablet Take 25 mg by mouth daily.    . tamsulosin (FLOMAX) 0.4 MG CAPS capsule TAKE 1 CAPSULE DAILY 90 capsule 2   No current facility-administered medications for this visit.     Allergies  Allergen Reactions  .  Morphine And Related Nausea And Vomiting    Social History   Social History  . Marital status: Married    Spouse name: N/A  . Number of children: 4  . Years of education: N/A   Occupational History  . Construction-Retired    Social History Main Topics  . Smoking status: Never Smoker  . Smokeless tobacco: Never Used  . Alcohol use No  . Drug use: No  . Sexual activity: Not on file   Other Topics Concern  . Not on file   Social History Narrative  . No narrative on file    Family History  Problem Relation Age of Onset  . Heart failure Father   . Emphysema Father   . Hypertension Mother   . Cancer Sister   . Healthy Sister     Review of Systems:  As stated in the HPI and otherwise negative.   BP (!) 144/62   Pulse 61   Ht 5\' 9"  (6.283 m)   Wt 235 lb 12.8 oz (107 kg)   SpO2 96%   BMI 34.82 kg/m   Physical Examination: General: Well developed, well nourished, NAD  HEENT: OP clear, mucus membranes moist  SKIN: warm, dry. No rashes. Neuro: No focal deficits  Musculoskeletal: Muscle strength 5/5 all ext  Psychiatric: Mood and affect normal  Neck: No JVD, no carotid bruits, no thyromegaly, no lymphadenopathy.  Lungs:Clear bilaterally, no wheezes, rhonci, crackles Cardiovascular: Regular rate and rhythm. Loud harsh systolic murmur. No gallops or rubs. Abdomen:Soft. Bowel sounds present. Non-tender.  Extremities: No lower extremity edema. Pulses are 2 + in the bilateral DP/PT.  Echo January 2018: Left ventricle: The cavity size was normal. Wall thickness was   increased in a pattern of mild LVH. Systolic function was normal.   The estimated ejection fraction was in the range of 55% to 60%.   Wall motion was normal; there were no regional wall motion   abnormalities. Doppler parameters are consistent with abnormal   left ventricular relaxation (grade 1 diastolic dysfunction). - Aortic valve:   Moderately calcified annulus. Moderately thickened, moderately  calcified leaflets.  Doppler:   There was moderate stenosis.      VTI ratio of LVOT to aortic valve: 0.26. Valve area (VTI): 1.28 cm^2. Indexed valve area (VTI): 0.55 cm^2/m^2. Peak velocity ratio of LVOT to aortic valve: 0.3. Valve area (Vmax): 1.48 cm^2. Indexed valve area (Vmax): 0.64 cm^2/m^2. Mean velocity ratio of LVOT to aortic valve: 0.27. Valve area (Vmean): 1.34 cm^2. Indexed valve area (Vmean): 0.58 cm^2/m^2.    Mean gradient (S):  28 mm Hg. Peak gradient (S): 49 mm Hg.  - Mitral valve: Valve area by continuity equation (using LVOT   flow): 2.41 cm^2.  EKG:  EKG is not ordered today. The ekg from 12/28/16 shows sinus with 1st degree AV block with RBBB. I have personally reviewed this.   Recent Labs: 07/27/2016: TSH 0.82 10/21/2016: NT-Pro BNP 399 10/29/2016: ALT 14; Hemoglobin 11.8 12/22/2016: BUN 22; Creatinine, Ser 0.94; Platelets 198; Potassium 5.2; Sodium 137   Lipid Panel    Component Value Date/Time   CHOL 180 10/29/2016 0911   TRIG 155.0 (H) 10/29/2016 0911   HDL 54.50 10/29/2016 0911   CHOLHDL 3 10/29/2016 0911   VLDL 31.0 10/29/2016 0911   LDLCALC 94 10/29/2016 0911   LDLDIRECT 101.0 04/26/2016 0915     Wt Readings from Last 3 Encounters:  01/14/17 235 lb 12.8 oz (107 kg)  01/05/17 235 lb (106.6 kg)  12/28/16 235 lb (106.6 kg)     Other studies Reviewed: Additional studies/ records that were reviewed today include: I have personally reviewed his echo images.  Review of the above records demonstrates: AS  Assessment and Plan:   1. Aortic valve stenosis: He has at least moderate aortic valve stenosis by echo in January 2018 but I suspect he has low gradient AS based on his symptoms. He has stable CAD by recent cath. I will arrange a Dobutamine stress echo here in our office to better assess his valve gradient. I have spent 30 minutes today specifically reviewing the TAVR procedure in detail with the patient and his wife. I do not think he would be a good  candidate for open surgical AVR. He would be a good candidate for TAVR. He is anxious to proceed with TAVR if it is necessary. I have reviewed the risks and possible complications of the procedure.  Following the stress echo, I will then make further plans in regard to TAVR workup.    Current medicines are reviewed at length with the patient today.  The patient does not have concerns regarding medicines.  The following changes have been made:  no change  Labs/ tests ordered today include: Dobutamine stress echo  Orders Placed This Encounter  Procedures  . ECHOCARDIOGRAM STRESS TEST   Disposition:   FU will be arranged pending results of stress echo.   Signed, Lauree Chandler, MD 01/14/2017 3:27 PM    [facility] [address], [place], [place]  [address] Phone: [phone]; Fax: [phone]

## 2017-01-14 NOTE — Patient Instructions (Signed)
Medication Instructions:  Your physician recommends that you continue on your current medications as directed. Please refer to the Current Medication list given to you today.   Labwork: none  Testing/Procedures: Your physician has requested that you have a stress echocardiogram. For further information please visit [email or web address]. Please follow instruction sheet as given.    Follow-Up: To be determined after stress echocardiogram  Any Other Special Instructions Will Be Listed Below (If Applicable).     If you need a refill on your cardiac medications before your next appointment, please call your pharmacy.

## 2017-02-03 ENCOUNTER — Telehealth (HOSPITAL_COMMUNITY): Payer: Self-pay | Admitting: *Deleted

## 2017-02-03 NOTE — Telephone Encounter (Signed)
Patient given detailed instructions per Stress Test Requisition Sheet for test on 02/07/17 at 2:30.Patient Notified to arrive 30 minutes early, and that it is imperative to arrive on time for appointment to keep from having the test rescheduled.  Patient verbalized understanding. Veronia Beets

## 2017-02-07 ENCOUNTER — Ambulatory Visit (HOSPITAL_COMMUNITY): Payer: Medicare Other

## 2017-02-07 ENCOUNTER — Ambulatory Visit (HOSPITAL_COMMUNITY): Payer: Medicare Other | Attending: Cardiovascular Disease

## 2017-02-07 ENCOUNTER — Other Ambulatory Visit: Payer: Self-pay

## 2017-02-07 DIAGNOSIS — I350 Nonrheumatic aortic (valve) stenosis: Secondary | ICD-10-CM | POA: Diagnosis not present

## 2017-02-07 MED ORDER — SODIUM CHLORIDE 0.9 % IV SOLN
10.0000 ug/kg/min | INTRAVENOUS | Status: AC
  Administered 2017-02-07: 20 ug/kg/min via INTRAVENOUS

## 2017-02-09 ENCOUNTER — Other Ambulatory Visit: Payer: Self-pay | Admitting: *Deleted

## 2017-02-09 ENCOUNTER — Telehealth: Payer: Self-pay | Admitting: Endocrinology

## 2017-02-09 ENCOUNTER — Other Ambulatory Visit: Payer: Self-pay

## 2017-02-09 DIAGNOSIS — I350 Nonrheumatic aortic (valve) stenosis: Secondary | ICD-10-CM

## 2017-02-09 NOTE — Telephone Encounter (Signed)
Please advise if you want to fill this for patient, last filled by Historical Provider. I do not see a note in your last OV for this medicine. Please advise.

## 2017-02-09 NOTE — Telephone Encounter (Signed)
He is taking Victoza 1.8 mg daily and needs refills

## 2017-02-09 NOTE — Telephone Encounter (Signed)
**  Remind patient they can make refill requests via MyChart**  Medication refill request (Name & Dosage):   TakingNot TakingUnknown  liraglutide 18 MG/3ML SOPN  Preferred pharmacy (Name & Address):   [facility], [place]  Other comments (if applicable):

## 2017-02-14 ENCOUNTER — Other Ambulatory Visit: Payer: Self-pay

## 2017-02-14 MED ORDER — LIRAGLUTIDE 18 MG/3ML ~~LOC~~ SOPN: 1.8000 mg | PEN_INJECTOR | Freq: Every day | SUBCUTANEOUS | 2 refills | Status: DC

## 2017-02-14 NOTE — Telephone Encounter (Signed)
This has been ordered 

## 2017-02-16 ENCOUNTER — Ambulatory Visit: Payer: Medicare Other | Attending: Cardiovascular Disease | Admitting: Physical Therapy

## 2017-02-16 ENCOUNTER — Ambulatory Visit (HOSPITAL_COMMUNITY)
Admission: RE | Admit: 2017-02-16 | Discharge: 2017-02-16 | Disposition: A | Discharge status: Home / Self Care | Payer: Medicare Other | Source: Ambulatory Visit | Attending: Cardiovascular Disease | Admitting: Cardiovascular Disease

## 2017-02-16 ENCOUNTER — Encounter: Payer: Self-pay | Admitting: Physical Therapy

## 2017-02-16 ENCOUNTER — Ambulatory Visit (HOSPITAL_COMMUNITY): Payer: Medicare Other

## 2017-02-16 ENCOUNTER — Encounter (HOSPITAL_COMMUNITY): Payer: Self-pay

## 2017-02-16 DIAGNOSIS — I350 Nonrheumatic aortic (valve) stenosis: Secondary | ICD-10-CM | POA: Insufficient documentation

## 2017-02-16 DIAGNOSIS — M6281 Muscle weakness (generalized): Secondary | ICD-10-CM | POA: Insufficient documentation

## 2017-02-16 DIAGNOSIS — R2689 Other abnormalities of gait and mobility: Secondary | ICD-10-CM | POA: Insufficient documentation

## 2017-02-16 DIAGNOSIS — I700 Atherosclerosis of aorta: Secondary | ICD-10-CM | POA: Diagnosis not present

## 2017-02-16 DIAGNOSIS — I517 Cardiomegaly: Secondary | ICD-10-CM | POA: Insufficient documentation

## 2017-02-16 DIAGNOSIS — R918 Other nonspecific abnormal finding of lung field: Secondary | ICD-10-CM | POA: Diagnosis not present

## 2017-02-16 DIAGNOSIS — I6523 Occlusion and stenosis of bilateral carotid arteries: Secondary | ICD-10-CM | POA: Diagnosis not present

## 2017-02-16 DIAGNOSIS — K8020 Calculus of gallbladder without cholecystitis without obstruction: Secondary | ICD-10-CM | POA: Diagnosis not present

## 2017-02-16 DIAGNOSIS — R293 Abnormal posture: Secondary | ICD-10-CM | POA: Diagnosis present

## 2017-02-16 DIAGNOSIS — I2510 Atherosclerotic heart disease of native coronary artery without angina pectoris: Secondary | ICD-10-CM | POA: Insufficient documentation

## 2017-02-16 LAB — PULMONARY FUNCTION TEST
DL/VA % pred: 74 %
DL/VA: 3.32 ml/min/mmHg/L
DLCO UNC % PRED: 53 %
DLCO unc: 15.94 ml/min/mmHg
FEF 25-75 PRE: 1.48 L/s
FEF 25-75 Post: 1.82 L/sec
FEF2575-%CHANGE-POST: 23 %
FEF2575-%Pred-Post: 104 %
FEF2575-%Pred-Pre: 84 %
FEV1-%CHANGE-POST: 5 %
FEV1-%PRED-PRE: 81 %
FEV1-%Pred-Post: 85 %
FEV1-POST: 2.22 L
FEV1-PRE: 2.09 L
FEV1FVC-%CHANGE-POST: 1 %
FEV1FVC-%Pred-Pre: 101 %
FEV6-%CHANGE-POST: 1 %
FEV6-%PRED-PRE: 85 %
FEV6-%Pred-Post: 86 %
FEV6-PRE: 2.9 L
FEV6-Post: 2.95 L
FEV6FVC-%Change-Post: 0 %
FEV6FVC-%PRED-PRE: 107 %
FEV6FVC-%Pred-Post: 107 %
FVC-%CHANGE-POST: 3 %
FVC-%PRED-POST: 82 %
FVC-%Pred-Pre: 79 %
FVC-POST: 3.02 L
FVC-Pre: 2.9 L
POST FEV6/FVC RATIO: 100 %
PRE FEV6/FVC RATIO: 100 %
Post FEV1/FVC ratio: 73 %
Pre FEV1/FVC ratio: 72 %
RV % PRED: 79 %
RV: 2.05 L
TLC % pred: 76 %
TLC: 5.07 L

## 2017-02-16 LAB — VAS US CAROTID
LCCADDIAS: -17 cm/s
LCCAPDIAS: 17 cm/s
LCCAPSYS: 111 cm/s
LEFT ECA DIAS: -16 cm/s
LEFT VERTEBRAL DIAS: 13 cm/s
LICADDIAS: -18 cm/s
LICADSYS: -104 cm/s
LICAPSYS: -90 cm/s
Left CCA dist sys: -89 cm/s
Left ICA prox dias: -13 cm/s
RCCADSYS: -70 cm/s
RCCAPSYS: 93 cm/s
RIGHT ECA DIAS: -6 cm/s
RIGHT VERTEBRAL DIAS: -12 cm/s
Right CCA prox dias: 13 cm/s

## 2017-02-16 LAB — POCT I-STAT CREATININE: CREATININE: 1.1 mg/dL (ref 0.61–1.24)

## 2017-02-16 MED ORDER — IOPAMIDOL (ISOVUE-370) INJECTION 76%
INTRAVENOUS | Status: AC
  Administered 2017-02-16: 100 mL
  Filled 2017-02-16: qty 100

## 2017-02-16 MED ORDER — IOPAMIDOL (ISOVUE-370) INJECTION 76%
INTRAVENOUS | Status: AC
  Administered 2017-02-16: 50 mL
  Filled 2017-02-16: qty 50

## 2017-02-16 MED ORDER — ALBUTEROL SULFATE (2.5 MG/3ML) 0.083% IN NEBU
2.5000 mg | INHALATION_SOLUTION | Freq: Once | RESPIRATORY_TRACT | Status: AC
  Administered 2017-02-16: 2.5 mg via RESPIRATORY_TRACT

## 2017-02-16 NOTE — Therapy (Signed)
[place], [place], [address] Phone: [phone]   Fax:  [phone]  Physical Therapy Evaluation  Patient Details  Name: Todd Romero MRN: [id number] Date of Birth: 1934/04/15 Referring Provider: Dr. Lauree Chandler  Encounter Date: 02/16/2017      PT End of Session - 02/16/17 1237    Visit Number 1   PT Start Time 1229   PT Stop Time 1315   PT Time Calculation (min) 46 min      Past Medical History:  Diagnosis Date  . Aortic stenosis   . CAD (coronary artery disease)   . Diabetes ([place])   . Hyperlipidemia   . Obesity (BMI 30-39.9) 06/18/2015  . OSA (obstructive sleep apnea)    Severe with AHI 27/hr now on CPAP  . Pure hypercholesterolemia   . RBBB   . Type II or unspecified type diabetes mellitus without mention of complication, not stated as uncontrolled   . Unspecified essential hypertension     Past Surgical History:  Procedure Laterality Date  . CARPAL TUNNEL RELEASE    . KNEE CARTILAGE SURGERY    . PARTIAL COLECTOMY    . RIGHT/LEFT HEART CATH AND CORONARY ANGIOGRAPHY N/A 12/28/2016   Procedure: Right/Left Heart Cath and Coronary Angiography;  Surgeon: Belva Crome, MD;  Location: [facility];  Service: Cardiovascular;  Laterality: N/A;  . VEIN SURGERY      There were no vitals filed for this visit.       Subjective Assessment - 02/16/17 1233    Subjective Pt reports progressive shortness of breath and fatigue with exertion over the last 6 months. Pt has difficulty with taking out the garbage, walking for exercise, etc.    Patient Stated Goals to fix heart   Currently in Pain? No/denies            [facility] PT Assessment - 02/16/17 0001      Assessment   Medical Diagnosis severe aortic stenosis   Referring Provider Dr. Lauree Chandler   Onset Date/Surgical Date --  6 months ago     Precautions   Precautions Fall     Restrictions   Weight Bearing  Restrictions No     Balance Screen   Has the patient fallen in the past 6 months No   Has the patient had a decrease in activity level because of a fear of falling?  No   Is the patient reluctant to leave their home because of a fear of falling?  No     [employer] residence   Living Arrangements Spouse/significant other   Home Access Stairs to enter   Entrance Stairs-Number of Steps 3   Entrance Stairs-Rails Left   [place] Two level;Able to live on main level with bedroom/bathroom   Additional Comments back steps have 6 staits with 2 rails     Prior Function   Level of Independence Independent with community mobility with device;Independent with household mobility without device     Posture/Postural Control   Posture/Postural Control Postural limitations   Postural Limitations Rounded Shoulders;Forward head     ROM / Strength   AROM / PROM / Strength Strength;AROM     AROM   Overall AROM Comments grossly WNL throughout     Strength   Overall Strength Comments UE grossly 4+/5, hips 4-/5, knees 4+/5, ankle DF L 4/5, R 4-/5   Strength Assessment Site Hand   Right/Left hand  Right;Left   Right Hand Grip (lbs) 51   Left Hand Grip (lbs) 50     Ambulation/Gait   Gait Comments Pt ambulated without device with excessive lateral trunk sway and L foot slap due to hx of foot drop with sciatica years ago. Pt's gait distance was limited by 45% for his age/gender. He report R knee pain of 4/10 during walking with onset around 450' of walking.           [facility] Pre-Surgical Assessment - 2017-03-07 0001    5 Meter Walk Test- trial 1 6 sec   5 Meter Walk Test- trial 2 6 sec.    5 Meter Walk Test- trial 3 5 sec.   5 meter walk test average 5.67 sec   4 Stage Balance Test tolerated for:  3 sec.   4 Stage Balance Test Position 3   Comment unable without UE support  due to R knee   ADL/IADL Independent with: Bathing;Dressing;Meal prep;Finances    ADL/IADL Needs Assistance with: [employer] work   ADL/IADL Fraility Index Vulnerable   6 Minute Walk- Baseline yes   BP (mmHg) 137/66   HR (bpm) 65   02 Sat (%RA) 95 %   Modified Borg Scale for Dyspnea 0- Nothing at all   Perceived Rate of Exertion (Borg) 6-   6 Minute Walk Post Test yes   BP (mmHg) 171/85   HR (bpm) 106   02 Sat (%RA) 99 %   Modified Borg Scale for Dyspnea 3- Moderate shortness of breath or breathing difficulty   Perceived Rate of Exertion (Borg) 12-   Aerobic Endurance Distance Walked 740          Objective measurements completed on examination: See above findings.                  PT Education - Mar 07, 2017 1331    Education provided Yes   Education Details fall risk   Person(s) Educated Patient;Spouse   Methods Explanation   Comprehension Verbalized understanding                     Plan - 03/07/17 1331    Clinical Impression Statement see below   PT Frequency One time visit   Consulted and Agree with Plan of Care Patient;Family member/caregiver     Clinical Impression Statement: Pt is an 83 yo male presenting to OP PT for evaluation prior to possible TAVR surgery due to severe aortic stenosis. Pt reports onset of shortness of breath and fatigue with exertion approximately 6 months ago. Symptoms are limiting his ability to walk for exercise, perform all yardwork and he reports difficulty taking out the trash at times. Pt presents with good ROM and fair strength, fair balance and is at high fall risk 4 stage balance test, good walking speed and fair to poor aerobic endurance per 6 minute walk test. Pt ambulated 370 feet in 2:30 before requesting a seated rest beak lasting 1:00. Pt able to resume after rest and ambulate an additional 370 feet. Pt ambulated a total of 740 feet in 6 minute walk. BP increased significantly with 6 minute walk test. Based on the Short Physical Performance Battery, patient has a frailty rating of 6/12 with </= 5/12  considered frail.   Patient demonstrated the following deficits and impairments:     Visit Diagnosis: Other abnormalities of gait and mobility  Muscle weakness (generalized)  Abnormal posture      G-Codes - 03/07/2017 1332  Functional Assessment Tool Used (Outpatient Only) 6 minute walk 740'   Functional Limitation Mobility: Walking and moving around   Mobility: Walking and Moving Around Current Status [phone]) At least 40 percent but less than 60 percent impaired, limited or restricted   Mobility: Walking and Moving Around Goal Status [phone]) At least 40 percent but less than 60 percent impaired, limited or restricted   Mobility: Walking and Moving Around Discharge Status [phone]) At least 40 percent but less than 60 percent impaired, limited or restricted       Problem List Patient Active Problem List   Diagnosis Date Noted  . Chronic diastolic heart failure ([place]) 12/18/2015  . Aortic stenosis 12/18/2015  . Obesity (BMI 30-39.9) 06/18/2015  . Obstructive sleep apnea 02/11/2015  . Dyspnea on exertion 02/11/2015  . Primary osteoarthritis of right knee 03/18/2014  . Essential hypertension, benign 06/21/2013    Class: Chronic  . RBBB   . CAD (coronary artery disease)     Class: Chronic  . Hyperlipidemia   . Type II or unspecified type diabetes mellitus without mention of complication, not stated as uncontrolled 05/02/2013    Dazja Houchin, PT 02/16/2017, 1:32 PM  [facility] [address] [place], [place], [address] Phone: [phone]   Fax:  [phone]  Name: CHANDLAR GUICE MRN: [id number] Date of Birth: 1934-04-25

## 2017-02-16 NOTE — Progress Notes (Signed)
VASCULAR LAB PRELIMINARY  PRELIMINARY  PRELIMINARY  PRELIMINARY  Carotid duplex completed.    Preliminary report:  Bilateral:  1-39% ICA stenosis.  Vertebral artery flow is antegrade.     Lynnel Zanetti, RVS 02/16/2017, 12:28 PM

## 2017-02-23 ENCOUNTER — Encounter: Payer: Medicare Other | Admitting: Thoracic Surgery (Cardiothoracic Vascular Surgery)

## 2017-02-24 ENCOUNTER — Encounter: Payer: Medicare Other | Admitting: Thoracic Surgery (Cardiothoracic Vascular Surgery)

## 2017-03-01 ENCOUNTER — Encounter: Payer: Self-pay | Admitting: Thoracic Surgery (Cardiothoracic Vascular Surgery)

## 2017-03-01 ENCOUNTER — Institutional Professional Consult (permissible substitution) (INDEPENDENT_AMBULATORY_CARE_PROVIDER_SITE_OTHER): Payer: Medicare Other | Admitting: Thoracic Surgery (Cardiothoracic Vascular Surgery)

## 2017-03-01 ENCOUNTER — Other Ambulatory Visit: Payer: Self-pay | Admitting: *Deleted

## 2017-03-01 VITALS — BP 156/79 | HR 64 | Resp 16 | Ht 69.0 in | Wt 225.0 lb

## 2017-03-01 DIAGNOSIS — I25119 Atherosclerotic heart disease of native coronary artery with unspecified angina pectoris: Secondary | ICD-10-CM

## 2017-03-01 DIAGNOSIS — I5032 Chronic diastolic (congestive) heart failure: Secondary | ICD-10-CM | POA: Diagnosis not present

## 2017-03-01 DIAGNOSIS — I2510 Atherosclerotic heart disease of native coronary artery without angina pectoris: Secondary | ICD-10-CM | POA: Diagnosis not present

## 2017-03-01 DIAGNOSIS — I350 Nonrheumatic aortic (valve) stenosis: Secondary | ICD-10-CM | POA: Diagnosis not present

## 2017-03-01 NOTE — Progress Notes (Signed)
[facility]  CARDIOTHORACIC SURGERY CONSULTATION REPORT  Referring Provider is Belva Crome, MD PCP is [name], MD  Chief Complaint  Patient presents with  . Aortic Stenosis    Surgical eval for TAVR, review all studies    HPI:  Patient is an 83 year old male with history of aortic stenosis, coronary artery disease, chronic diastolic congestive heart failure, hypertension, obesity, obstructive sleep apnea, right bundle branch block, insulin-dependent type 2 diabetes mellitus, and degenerative arthritis with somewhat limited mobility who has been referred for surgical consultation to discuss treatment options for management of severe symptomatic aortic stenosis. The patient was referred to Dr. Tamala Julian for cardiology consultation several years ago because of right bundle branch block with abnormal EKG. He underwent echocardiogram and diagnostic cardiac catheterization and was found to have mild aortic stenosis and nonobstructive coronary artery disease. He has been followed regularly ever since in the severity of aortic stenosis has gradually progressed. Transthoracic echocardiogram performed in 2017 revealed normal left ventricular systolic function with ejection fraction estimated 55-60% and moderate aortic stenosis with peak velocity across the aortic valve measured 3.65 m/s corresponding to mean transvalvular gradient estimated 29 mmHg. This was unchanged in comparison with previous echocardiogram in 2015. Over the past year the patient has developed progressive symptoms of exertional shortness of breath and fatigue.  Follow-up transthoracic echocardiogram performed 10/01/2016 revealed similar findings with peak velocity across the aortic valve measured 3.5 m/s corresponding to mean transvalvular gradient estimated 28 mmHg. Left ventricular systolic function remained normal with ejection fraction estimated 55-60%.  Diagnostic cardiac  catheterization was performed 12/28/2016. Catheterization revealed moderate nonobstructive coronary artery disease with 100% stenosis of a small second diagonal branch, 30-50% stenosis in the left anterior descending coronary artery, eccentric 50-90% proximal stenosis at the origin of left posterior descending coronary artery, and otherwise nonobstructive coronary artery disease. Mean transvalvular gradient measured at catheterization was 29 mmHg corresponding to aortic valve area calculated 1.35 cm.  The patient was referred to the multidisciplinary heart valve clinic and evaluated by Dr. Angelena Form. The patient subsequently underwent dobutamine stress echocardiography, confirming the presence of paradoxical low flow normal ejection fraction severe aortic stenosis with increasing mean transvalvular gradient from 36 mmHg at baseline to 58 mmHg with dobutamine stress her the patient was subsequently referred for surgical consultation.    The patient is married and lives locally in [place] with his wife. 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 He and his wife moved to [place] and 2004.  He remained reasonably active physically until a little over one year ago when he began to experience progressive symptoms of exertional shortness of breath and fatigue. He is also limited somewhat because of progressive arthritis in his right knee. Over the past year he has developed worsening exertional shortness of breath and fatigue. He states that symptoms wax and wane in severity are affected by the heat. At times he gets short of breath and tired with relatively low level activity. He denies any history of resting shortness of breath, PND, orthopnea, dizzy spells, or syncope. He has chronic mild lower extremity edema. He has never had any chest pain or chest tightness either with activity or at rest.  He has moderate difficulty walking because of the  arthritis in his right knee and he occasionally uses a cane to provide support.  Past Medical History:  Diagnosis Date  . Aortic stenosis   .  CAD (coronary artery disease)   . Diabetes ([place])   . Hyperlipidemia   . Obesity (BMI 30-39.9) 06/18/2015  . OSA (obstructive sleep apnea)    Severe with AHI 27/hr now on CPAP  . Pure hypercholesterolemia   . RBBB   . Type II or unspecified type diabetes mellitus without mention of complication, not stated as uncontrolled   . Unspecified essential hypertension     Past Surgical History:  Procedure Laterality Date  . CARPAL TUNNEL RELEASE    . KNEE CARTILAGE SURGERY    . PARTIAL COLECTOMY    . RIGHT/LEFT HEART CATH AND CORONARY ANGIOGRAPHY N/A 12/28/2016   Procedure: Right/Left Heart Cath and Coronary Angiography;  Surgeon: Belva Crome, MD;  Location: [facility];  Service: Cardiovascular;  Laterality: N/A;  . VEIN SURGERY      Family History  Problem Relation Age of Onset  . Heart failure Father   . Emphysema Father   . Hypertension Mother   . Cancer Sister   . Healthy Sister     Social History   Social History  . Marital status: Married    Spouse name: N/A  . Number of children: 4  . Years of education: N/A   Occupational History  . Construction-Retired    Social History Main Topics  . Smoking status: Never Smoker  . Smokeless tobacco: Never Used  . Alcohol use No  . Drug use: No  . Sexual activity: Not on file   Other Topics Concern  . Not on file   Social History Narrative  . No narrative on file    Current Outpatient Prescriptions  Medication Sig Dispense Refill  . ACCU-CHEK AVIVA PLUS test strip USE AS INSTRUCTED TO CHECK BLOOD SUGAR TWICE A DAY 200 each 2  . acetaminophen (TYLENOL) 500 MG tablet Take 500 mg by mouth every 4 (four) hours as needed for moderate pain or headache.    [name] amLODipine (NORVASC) 10 MG tablet Take 5 mg by mouth daily.    [name] aspirin 81 MG tablet Take 81 mg by mouth daily.    .  B-D INS SYR ULTRAFINE 1CC/31G 31G X 5/16" 1 ML MISC USE TO INJECT INSULIN DAILY 100 each 3  . ferrous sulfate 325 (65 FE) MG tablet Take 325 mg by mouth daily with breakfast.    . fish oil-omega-3 fatty acids 1000 MG capsule Take 1 g by mouth daily.     . furosemide (LASIX) 40 MG tablet Take 40 mg by mouth daily at 12 noon.     [name] glimepiride (AMARYL) 2 MG tablet Take 0.5 tablets (1 mg total) by mouth daily. 45 tablet 1  . insulin lispro (HUMALOG KWIKPEN) 100 UNIT/ML KiwkPen 4-8 units before the main meals of the day 15 mL 1  . insulin NPH Human (HUMULIN N,NOVOLIN N) 100 UNIT/ML injection Inject 0.22 mLs (22 Units total) into the skin at bedtime. Uses Vial 20 mL 1  . Insulin Pen Needle (B-D UF III MINI PEN NEEDLES) 31G X 5 MM MISC USE 2 PEN NEEDLE PER DAY WITH HUMALOG AND VICTOZA 200 each 3  . liraglutide 18 MG/3ML SOPN Inject 0.3 mLs (1.8 mg total) into the skin at bedtime. 15 pen 2  . metFORMIN (GLUMETZA) 500 MG (MOD) 24 hr tablet Take 500 mg by mouth 2 (two) times daily with a meal.    . Multiple Vitamin (MULTIVITAMIN WITH MINERALS) TABS tablet Take 1 tablet by mouth daily.    . Multiple Vitamins-Minerals (PRESERVISION/LUTEIN)  CAPS Take 1 capsule by mouth daily.     [name] olmesartan (BENICAR) 40 MG tablet Take 1 tablet (40 mg total) by mouth daily. 90 tablet 3  . pravastatin (PRAVACHOL) 80 MG tablet Take 80 mg by mouth daily.    . sodium chloride (OCEAN) 0.65 % nasal spray Place 2 sprays into the nose as needed for congestion.    [name] spironolactone (ALDACTONE) 25 MG tablet Take 25 mg by mouth daily.    . tamsulosin (FLOMAX) 0.4 MG CAPS capsule TAKE 1 CAPSULE DAILY 90 capsule 2   No current facility-administered medications for this visit.     Allergies  Allergen Reactions  . Morphine And Related Nausea And Vomiting      Review of Systems:   General:  normal appetite, decreased energy, no weight gain, no weight loss, no fever  Cardiac:  no chest pain with exertion, no chest pain at rest, +  SOB with exertion, no resting SOB, no PND, no orthopnea, no palpitations, no arrhythmia, no atrial fibrillation, + LE edema, no dizzy spells, no syncope  Respiratory:  + exertional shortness of breath, no home oxygen, no productive cough, no dry cough, no bronchitis, no wheezing, no hemoptysis, no asthma, no pain with inspiration or cough, + sleep apnea, no CPAP at night  GI:   no difficulty swallowing, no reflux, no frequent heartburn, no hiatal hernia, no abdominal pain, no constipation, no diarrhea, no hematochezia, no hematemesis, no melena  GU:   no dysuria,  + frequency, no urinary tract infection, no hematuria, no enlarged prostate, no kidney stones, no kidney disease  Vascular:  no pain suggestive of claudication, no pain in feet, no leg cramps, no varicose veins, no DVT, no non-healing foot ulcer  Neuro:   no stroke, no TIA's, no seizures, no headaches, no temporary blindness one eye,  no slurred speech, no peripheral neuropathy, + chronic pain, mild instability of gait, no memory/cognitive dysfunction  Musculoskeletal: + arthritis, + joint swelling, no myalgias, + difficulty walking, mildly limited mobility   Skin:   no rash, no itching, no skin infections, no pressure sores or ulcerations  Psych:   no anxiety, no depression, no nervousness, no unusual recent stress  Eyes:   no blurry vision, + floaters, no recent vision changes, no wears glasses or contacts  ENT:   + hearing loss, no loose or painful teeth, no dentures, last saw dentist 01/2016  Hematologic:  no easy bruising, no abnormal bleeding, no clotting disorder, no frequent epistaxis  Endocrine:  + diabetes, does check CBG's at home           Physical Exam:   BP (!) 156/79 (BP Location: Right Arm, Patient Position: Sitting, Cuff Size: Large)   Pulse 64   Resp 16   Ht 5' 9"  (1.753 m)   Wt 225 lb (102.1 kg)   SpO2 97% Comment: RA  BMI 33.23 kg/m   General:  Moderately obese,  well-appearing  HEENT:  Unremarkable    Neck:   no JVD, no bruits, no adenopathy   Chest:   clear to auscultation, symmetrical breath sounds, no wheezes, no rhonchi   CV:   RRR, grade III/VI crescendo/decrescendo murmur heard best at RUSB,  no diastolic murmur  Abdomen:  soft, non-tender, no masses   Extremities:  warm, well-perfused, pulses palpable, no LE edema  Rectal/GU  Deferred  Neuro:   Grossly non-focal and symmetrical throughout  Skin:   Clean and dry, no rashes, no breakdown   Diagnostic  Tests:  Echocardiography  Patient:    Dawsyn, Ramsaran MR #:       [id number] Study Date: 10/01/2016 Gender:     M Age:        24 Height:     177.8 cm Weight:     106.1 kg BSA:        2.32 m^2 Pt. Status: Room:   ATTENDING    Mertie Moores, M.D.  PERFORMING   [facility], Outpatient  SONOGRAPHER  [facility], RDCS  ORDERING     Eileen Stanford  REFERRING    Angelena Form R  cc:  ------------------------------------------------------------------- LV EF: 55% -   60%  ------------------------------------------------------------------- Indications:      Aortic Stenosis (I35.0).  ------------------------------------------------------------------- History:   PMH:  Right Bundle Branch Block, Obesity, Obstructive Sleep Apnea.  Coronary artery disease.  Aortic valve disease.  Risk factors:  Family history of coronary artery disease. Hypertension. Diabetes mellitus. Dyslipidemia.  ------------------------------------------------------------------- Study Conclusions  - Left ventricle: The cavity size was normal. Wall thickness was   increased in a pattern of mild LVH. Systolic function was normal.   The estimated ejection fraction was in the range of 55% to 60%.   Wall motion was normal; there were no regional wall motion   abnormalities. Doppler parameters are consistent with abnormal   left ventricular relaxation (grade 1 diastolic dysfunction). - Aortic valve: Moderately calcified annulus.  Moderately thickened,   moderately calcified leaflets. There was moderate stenosis. Valve   area (VTI): 1.28 cm^2. Valve area (Vmax): 1.48 cm^2. Valve area   (Vmean): 1.34 cm^2. - Mitral valve: Valve area by continuity equation (using LVOT   flow): 2.41 cm^2.  ------------------------------------------------------------------- Study data:  Could not obtain IV access for Definity Contrast. Attempted IV x 2, without success. Comparison was made to the study of 01/05/2016.  Study status:  Routine.  Procedure:  Transthoracic echocardiography. Image quality was adequate. Echocardiography.  M-mode, complete 2D, spectral Doppler, and color Doppler.  Birthdate:  Patient birthdate: 12-31-1933.  Age:  Patient is 83 yr old.  Sex:  Gender: male.    BMI: 33.5 kg/m^2.  Blood pressure:     178/80  Patient status:  Outpatient.  Study date: Study date: 10/01/2016. Study time: 01:21 PM.  Location:  [facility]  -------------------------------------------------------------------  ------------------------------------------------------------------- Left ventricle:  The cavity size was normal. Wall thickness was increased in a pattern of mild LVH. Systolic function was normal. The estimated ejection fraction was in the range of 55% to 60%. Wall motion was normal; there were no regional wall motion abnormalities. Doppler parameters are consistent with abnormal left ventricular relaxation (grade 1 diastolic dysfunction).  ------------------------------------------------------------------- Aortic valve:   Moderately calcified annulus. Moderately thickened, moderately calcified leaflets.  Doppler:   There was moderate stenosis.      VTI ratio of LVOT to aortic valve: 0.26. Valve area (VTI): 1.28 cm^2. Indexed valve area (VTI): 0.55 cm^2/m^2. Peak velocity ratio of LVOT to aortic valve: 0.3. Valve area (Vmax): 1.48 cm^2. Indexed valve area (Vmax): 0.64 cm^2/m^2. Mean velocity ratio of LVOT to  aortic valve: 0.27. Valve area (Vmean): 1.34 cm^2. Indexed valve area (Vmean): 0.58 cm^2/m^2.    Mean gradient (S): 28 mm Hg. Peak gradient (S): 49 mm Hg.  ------------------------------------------------------------------- Aorta:  Ascending aorta: The ascending aorta was mildly dilated.  ------------------------------------------------------------------- Mitral valve:   Mildly thickened leaflets .  Doppler:  There was no significant regurgitation.    Valve area by pressure half-time: 2.65 cm^2. Indexed valve area  by pressure half-time: 1.14 cm^2/m^2. Valve area by continuity equation (using LVOT flow): 2.41 cm^2. Indexed valve area by continuity equation (using LVOT flow): 1.04 cm^2/m^2.    Mean gradient (D): 2 mm Hg. Peak gradient (D): 4 mm Hg.  ------------------------------------------------------------------- Left atrium:  The atrium was normal in size.  ------------------------------------------------------------------- Right ventricle:  The cavity size was normal. Systolic function was normal.  ------------------------------------------------------------------- Pulmonic valve:    The valve appears to be grossly normal. Doppler:  There was trivial regurgitation.  ------------------------------------------------------------------- Pericardium:  There was no pericardial effusion.  ------------------------------------------------------------------- Measurements   Left ventricle                           Value          Reference  LV ID, ED, PLAX chordal          (H)     57.6  mm       43 - 52  LV ID, ES, PLAX chordal                  35.6  mm       23 - 38  LV fx shortening, PLAX chordal           38    %        >=29  LV PW thickness, ED                      12.5  mm       ----------  IVS/LV PW ratio, ED                      1.02           <=1.3  Stroke volume, 2D                        124   ml       ----------  Stroke volume/bsa, 2D                    53    ml/m^2    ----------  LV e&', lateral                           6.64  cm/s     ----------  LV E/e&', lateral                         15.21          ----------  LV e&', medial                            4.46  cm/s     ----------  LV E/e&', medial                          22.65          ----------  LV e&', average                           5.55  cm/s     ----------  LV E/e&', average  18.2           ----------    Ventricular septum                       Value          Reference  IVS thickness, ED                        12.8  mm       ----------    LVOT                                     Value          Reference  LVOT ID, S                               25    mm       ----------  LVOT area                                4.91  cm^2     ----------  LVOT peak velocity, S                    105   cm/s     ----------  LVOT mean velocity, S                    67.8  cm/s     ----------  LVOT VTI, S                              25.2  cm       ----------    Aortic valve                             Value          Reference  Aortic valve peak velocity, S            349   cm/s     ----------  Aortic valve mean velocity, S            249   cm/s     ----------  Aortic valve VTI, S                      96.5  cm       ----------  Aortic mean gradient, S                  28    mm Hg    ----------  Aortic peak gradient, S                  49    mm Hg    ----------  VTI ratio, LVOT/AV                       0.26           ----------  Aortic valve area, VTI                   1.28  cm^2     ----------  Aortic valve area/bsa, VTI               0.55  cm^2/m^2 ----------  Velocity ratio, peak, LVOT/AV            0.3            ----------  Aortic valve area, peak velocity         1.48  cm^2     ----------  Aortic valve area/bsa, peak              0.64  cm^2/m^2 ----------  velocity  Velocity ratio, mean, LVOT/AV            0.27           ----------  Aortic valve area, mean velocity         1.34   cm^2     ----------  Aortic valve area/bsa, mean              0.58  cm^2/m^2 ----------  velocity    Aorta                                    Value          Reference  Aortic root ID, ED                       32    mm       ----------  Ascending aorta ID, A-P, S               41    mm       ----------    Left atrium                              Value          Reference  LA ID, A-P, ES                           60    mm       ----------  LA ID/bsa, A-P                   (H)     2.58  cm/m^2   <=2.2  LA volume, S                             74.5  ml       ----------  LA volume/bsa, S                         32.1  ml/m^2   ----------  LA volume, ES, 1-p A4C                   81.4  ml       ----------  LA volume/bsa, ES, 1-p A4C               35    ml/m^2   ----------  LA volume, ES, 1-p A2C                   65.4  ml       ----------  LA volume/bsa, ES, 1-p A2C  28.1  ml/m^2   ----------    Mitral valve                             Value          Reference  Mitral E-wave peak velocity              101   cm/s     ----------  Mitral A-wave peak velocity              146   cm/s     ----------  Mitral mean velocity, D                  63.7  cm/s     ----------  Mitral deceleration time         (H)     342   ms       150 - 230  Mitral pressure half-time                83    ms       ----------  Mitral mean gradient, D                  2     mm Hg    ----------  Mitral peak gradient, D                  4     mm Hg    ----------  Mitral E/A ratio, peak                   0.7            ----------  Mitral valve area, PHT, DP               2.65  cm^2     ----------  Mitral valve area/bsa, PHT, DP           1.14  cm^2/m^2 ----------  Mitral valve area, LVOT                  2.41  cm^2     ----------  continuity  Mitral valve area/bsa, LVOT              1.04  cm^2/m^2 ----------  continuity  Mitral annulus VTI, D                    51.4  cm       ----------    Right atrium                              Value          Reference  RA ID, S-I, ES, A4C              (H)     56.3  mm       34 - 49  RA area, ES, A4C                         19    cm^2     8.3 - 19.5  RA volume, ES, A/L                       52.5  ml       ----------  RA volume/bsa, ES, A/L  22.6  ml/m^2   ----------    Right ventricle                          Value          Reference  TAPSE                                    29.5  mm       ----------  RV s&', lateral, S                        17.8  cm/s     ----------  Legend: (L)  and  (H)  mark values outside specified reference range.  ------------------------------------------------------------------- Prepared and Electronically Authenticated by  Mertie Moores, M.D. 2018-01-26T17:22:21    Right/Left Heart Cath and Coronary Angiography  Conclusion    Difficult procedure due to severe angulation in the right innominate/ascending aortic region that produced catheter resistance to torque and advancement.  Moderate aortic stenosis with a transvalvular gradient of 29 mmHg (mean) and 33-38 mmHg (peak to peak). Calculated aortic valve area 1.35 cm.  Elevated left ventricular end-diastolic pressure and pulmonary capillary wedge pressure. Known LV systolic function with EF greater than 50% by recent echo suggesting chronic diastolic left heart failure.  Heavy three-vessel coronary calcification.  100% stenosis in the mid body of the second diagonal. There is diffuse 30-50% narrowing in the LAD.  The circumflex coronary is dominant. Moderate to heavy calcification is noted throughout the distal vessel and in the proximal segment. There is eccentric 50-90% stenosis proximal to the origin of the L-PDA.  Nondominant right coronary. Not selectively engaged due to poor catheter control. The vessel is patent and supplies only the right ventricle.  RECOMMENDATIONS:   Coronary status is essentially unchanged compared to 5 years  ago.  Moderate aortic stenosis based upon hemodynamics obtained from this case. This matches the recent echo.  Explanation for exertional fatigue is uncertain. Consider referral to the valve clinic for a second opinion concerning management of aortic stenosis. May need dobutamine echo as he is potentially a low gradient severe aortic stenosis substrate.  Indications   Aortic stenosis, moderate [I35.0 (ICD-10-CM)]  Coronary artery disease involving native coronary artery of native heart without angina pectoris [I25.10 (ICD-10-CM)]  Procedural Details/Technique   Technical Details The right radial area was sterilely prepped and draped. Intravenous sedation with Versed and fentanyl was administered. 1% Xylocaine was infiltrated to achieve local analgesia. Using real time vascular ultrasound, a double wall stick with an angiocath was utilized to obtain intra-arterial access. The modified Seldinger technique was used to place a 51F " Slender" sheath in the right radial artery. Weight based heparin was administered. Coronary angiography was done using 5 F catheters. The procedure was very difficult due to significant angulation and tortuosity in the innominate/aortic bifurcation. Right coronary angiography was performed in multiple catheters. Selective engagement was not possible. The vessel is nondominant, patent, and appears to have diffuse disease. Left ventricular hemodymic recordings were done using the JR 4 catheter.. Left coronary angiography was performed with a JL 3.5 cm.  Right heart catheterization was performed by exchanging an antecubital angiocath IV for a 5 French brachial sheath. 1% Xylocaine local infiltration at the IV site was given prior to sheath exchange. A double glove technique was used. The modified Seldinger technique was employed.  After sheath insertion, right heart cath was performed using a 5 French balloon tipped catheter. Pressures were recorded in each chamber. A main pulmonary  artery O2 saturation was obtained. A wedge pressure was also measured.  Right radial hemostasis was achieved with a compression/pneumatic bandage.  During this procedure the patient is administered a total of Versed 1 mg and Fentanyl 50 mg to achieve and maintain moderate conscious sedation. The patient's heart rate, blood pressure, and oxygen saturation are monitored continuously during the procedure. The period of conscious sedation is 60 minutes, of which I was present face-to-face 100% of this time.   Estimated blood loss <50 mL.  During this procedure the patient was administered the following to achieve and maintain moderate conscious sedation: Versed 1 mg, Fentanyl 50 mcg, while the patient's heart rate, blood pressure, and oxygen saturation were continuously monitored. The period of conscious sedation was 60 minutes, of which I was present face-to-face 100% of this time.    Coronary Findings   Dominance: Left  Left Anterior Descending  Prox LAD to Mid LAD lesion, 50% stenosed.  First Diagonal Branch  Vessel is small in size.  Second Diagonal [employer] 2nd Diag lesion, 75% stenosed.  2nd Diag lesion, 100% stenosed. The lesion is calcified.  Left Circumflex  Mid Cx lesion, 40% stenosed.  First Obtuse Marginal Branch  Vessel is small in size.  Third Obtuse Marginal Branch  Vessel is small in size.  3rd Mrg lesion, 60% stenosed.  Fourth Obtuse Marginal Branch  Vessel is small in size.  Ost 4th Mrg to 4th Mrg lesion, 90% stenosed.  Left Posterior Descending Artery  Ost LPDA to LPDA lesion, 90% stenosed. The lesion is calcified.  Right Coronary Artery  Ost RCA to Mid RCA lesion, 50% stenosed. The lesion is segmental.  Right Heart   Right Heart Pressures Hemodynamic findings consistent with mild pulmonary hypertension.    Left Heart   Left Ventricle LV end diastolic pressure is moderately elevated.    Coronary Diagrams   Diagnostic Diagram       Implants     No  implant documentation for this case.  PACS Images   Show images for Cardiac catheterization   Link to Procedure Log   Procedure Log    Hemo Data    Most Recent Value  Fick Cardiac Output 6.32 L/min  Fick Cardiac Output Index 2.86 (L/min)/BSA  Aortic Mean Gradient 29 mmHg  Aortic Peak Gradient 31 mmHg  Aortic Valve Area 1.35  Aortic Value Area Index 0.61 cm2/BSA  RA A Wave 12 mmHg  RA V Wave 10 mmHg  RA Mean 9 mmHg  RV Systolic Pressure 44 mmHg  RV Diastolic Pressure 4 mmHg  RV EDP 12 mmHg  PA Systolic Pressure 48 mmHg  PA Diastolic Pressure 11 mmHg  PA Mean 25 mmHg  PW A Wave 24 mmHg  PW V Wave 27 mmHg  PW Mean 22 mmHg  AO Systolic Pressure 025 mmHg  AO Diastolic Pressure 62 mmHg  AO Mean 88 mmHg  LV Systolic Pressure 852 mmHg  LV Diastolic Pressure 10 mmHg  LV EDP 29 mmHg  Arterial Occlusion Pressure Extended Systolic Pressure 778 mmHg  Arterial Occlusion Pressure Extended Diastolic Pressure 58 mmHg  Arterial Occlusion Pressure Extended Mean Pressure 86 mmHg  Left Ventricular Apex Extended Systolic Pressure 242 mmHg  Left Ventricular Apex Extended Diastolic Pressure 11 mmHg  Left Ventricular Apex Extended EDP Pressure 21 mmHg  QP/QS 1  TPVR Index 8.74 HRUI  TSVR Index 30.77 HRUI  PVR SVR Ratio 0.04  TPVR/TSVR Ratio 0.28     Stress Echocardiography  Patient:    Amman, Bartel MR #:       [id number] Study Date: 02/07/2017 Gender:     M Age:        59 Height:     175.3 cm Weight:     107 kg BSA:        2.32 m^2 Pt. Status: Room:   ATTENDING    Mertie Moores, M.D.  ORDERING     McAlhany, Christopher  REFERRING    McAlhany, Eudora, Outpatient  SONOGRAPHER  [facility], RDCS  cc:  ------------------------------------------------------------------- LV EF: 55% -   60%  ------------------------------------------------------------------- Indications:      Aortic Stenosis  (I35.0).  ------------------------------------------------------------------- History:   PMH:  Obesity, Obstructive Sleep Apnea, Moderate Aortic Stenosis Pre-op evaluation for TAVR  Dyspnea.  Coronary artery disease.  Aortic valve disease.  Risk factors:  Family history of coronary artery disease. Hypertension. Diabetes mellitus. Dyslipidemia.  Medications:  No other medications.  ------------------------------------------------------------------- Study Conclusions  - Left ventricle: The cavity size was normal. Systolic function was   normal. The estimated ejection fraction was in the range of 55%   to 60%. - Aortic valve: There was severe stenosis. Valve area (VTI): 1.46   cm^2. Valve area (Vmax): 1.51 cm^2. Valve area (Vmean): 1.33   cm^2. - Left atrium: The atrium was moderately dilated.  Impressions:  - Severe aortic stenosis with increase of mean AV gradient from   baseline of 36 to 58 with Dobutamine stress.  ------------------------------------------------------------------- Study data:   Study status:  Routine.  Consent:  The risks, benefits, and alternatives to the procedure were explained to the patient and informed consent was obtained.  Procedure:  Initial setup. The patient was brought to the laboratory. A baseline ECG was recorded. Surface ECG leads and automatic cuff blood pressure measurements were monitored.  Dobutamine stress test. Stress testing was performed, with dobutamine infusion from 5 to 20 mcg/kg/min by 10 mcg/kg/min increments. The infusion was terminated due to maximal dose administration. Transthoracic stress echocardiography for left ventricular function evaluation, assessment of valvular function, and preoperative diagnosis. Images were captured at baseline, low dose, peak dose, and recovery. Study completion:  The patient tolerated the procedure well. There were no complications.          Dobutamine. Stress echocardiography.  2D.   Birthdate:  Patient birthdate: 09/10/1933. Age:  Patient is 83 yr old.  Sex:  Gender: male.    BMI: 34.8 kg/m^2.  Blood pressure:     134/76  Patient status:  Outpatient. Study date:  Study date: 02/07/2017. Study time: 02:54 PM.  -------------------------------------------------------------------  ------------------------------------------------------------------- Left ventricle:  The cavity size was normal. Systolic function was normal. The estimated ejection fraction was in the range of 55% to 60%.  ------------------------------------------------------------------- Aortic valve:   Doppler:   There was severe stenosis.      VTI ratio of LVOT to aortic valve: 0.28. Valve area (VTI): 1.46 cm^2. Indexed valve area (VTI): 0.63 cm^2/m^2. Peak velocity ratio of LVOT to aortic valve: 0.28. Valve area (Vmax): 1.51 cm^2. Indexed valve area (Vmax): 0.65 cm^2/m^2. Mean velocity ratio of LVOT to aortic valve: 0.25. Valve area (Vmean): 1.33 cm^2. Indexed valve area (Vmean): 0.57 cm^2/m^2.    Mean gradient (S): 50 mm Hg. Peak gradient (S): 84 mm Hg.  ------------------------------------------------------------------- Aorta:  The aorta was mildly dilated.  ------------------------------------------------------------------- Left  atrium:  The atrium was moderately dilated.   ------------------------------------------------------------------- Stress protocol:  +-----------------------+---------------+--+------------+--------+ !Stage                  !Time into phase!HR!BP (mmHg)   !Symptoms! +-----------------------+---------------+--+------------+--------+ !Baseline               !---------------!63!134/76 (95) !None    ! +-----------------------+---------------+--+------------+--------+ !Dobutamine 5 ug/kg/min !3 min          !63!134/66 (89) !--------! +-----------------------+---------------+--+------------+--------+ !Dobutamine 10 ug/kg/min!3 min          !66!142/67 (92)  !--------! +-----------------------+---------------+--+------------+--------+ !Dobutamine 20 ug/kg/min!3 min          !94!114/54 (74) !--------! +-----------------------+---------------+--+------------+--------+ !Immediate post stress  !---------------!96!116/55 (75) !--------! +-----------------------+---------------+--+------------+--------+ !Recovery; 1 min        !---------------!85!152/75 (101)!--------! +-----------------------+---------------+--+------------+--------+ !Recovery; 5 min        !---------------!27!741/28 (99) !--------! +-----------------------+---------------+--+------------+--------+ !Late recovery          !---------------!78!676/72 (96) !--------! +-----------------------+---------------+--+------------+--------+  ------------------------------------------------------------------- Stress results:   Maximal heart rate during stress was 96 bpm (69% of maximal predicted heart rate). The maximal predicted heart rate was 140 bpm.The heart rate response to stress was normal. The rate-pressure product for the peak heart rate and blood pressure was 12920 mm Hg/min.  ------------------------------------------------------------------- Baseline:  - LV global systolic function was normal. - Normal wall motion; no LV regional wall motion abnormalities.  Low dose:  - LV global systolic function was at the upper limits of normal. - No evidence for new LV regional wall motion abnormalities.  Peak stress:  - LV global systolic function was vigorous. - No evidence for new LV regional wall motion abnormalities.  Recovery:  - LV global systolic function was vigorous. - No evidence for new LV regional wall motion abnormalities.  ------------------------------------------------------------------- Post procedure conclusions Ascending Aorta:  - The aorta was mildly dilated.  ------------------------------------------------------------------- Stress echo  results:     With Dobutamine infusion, the mean AV gradient increased from 36 mmHg to 58.  Severe Aortic stenosis with increasing gradient with Dobutamine stress.  ------------------------------------------------------------------- Measurements   Left ventricle                            Value          Reference  LV ID, ED, PLAX chordal           (H)     56.4  mm       43 - 52  LV ID, ES, PLAX chordal                   37.8  mm       23 - 38  LV fx shortening, PLAX chordal            33    %        >=29  LV PW thickness, ED                       10.9  mm       ---------  IVS/LV PW ratio, ED                       1.08           <=1.3  Stroke volume, 2D                         143  ml       ---------  Stroke volume/bsa, 2D                     62    ml/m^2   ---------    Ventricular septum                        Value          Reference  IVS thickness, ED                         11.8  mm       ---------    LVOT                                      Value          Reference  LVOT ID, S                                26    mm       ---------  LVOT area                                 5.31  cm^2     ---------  LVOT peak velocity, S                     130   cm/s     ---------  LVOT mean velocity, S                     82.7  cm/s     ---------  LVOT VTI, S                               26.9  cm       ---------  LVOT peak gradient, S                     7     mm Hg    ---------    Aortic valve                              Value          Reference  Aortic valve peak velocity, S             457   cm/s     ---------  Aortic valve mean velocity, S             330   cm/s     ---------  Aortic valve VTI, S                       97.8  cm       ---------  Aortic mean gradient, S                   50    mm Hg    ---------  Aortic peak gradient, S                   84  mm Hg    ---------  VTI ratio, LVOT/AV                        0.28           ---------  Aortic valve area, VTI                     1.46  cm^2     ---------  Aortic valve area/bsa, VTI                0.63  cm^2/m^2 ---------  Velocity ratio, peak, LVOT/AV             0.28           ---------  Aortic valve area, peak velocity          1.51  cm^2     ---------  Aortic valve area/bsa, peak               0.65  cm^2/m^2 ---------  velocity  Velocity ratio, mean, LVOT/AV             0.25           ---------  Aortic valve area, mean velocity          1.33  cm^2     ---------  Aortic valve area/bsa, mean               0.57  cm^2/m^2 ---------  velocity    Aorta                                     Value          Reference  Aortic root ID, ED                        41    mm       ---------  Ascending aorta ID, A-P, S                37    mm       ---------    Left atrium                               Value          Reference  LA ID, A-P, ES                            52    mm       ---------  LA ID/bsa, A-P                    (H)     2.24  cm/m^2   <=2.2  Legend: (L)  and  (H)  mark values outside specified reference range.  ------------------------------------------------------------------- Prepared and Electronically Authenticated by  Mertie Moores, M.D. 2018-06-04T17:04:13   Cardiac TAVR CT  TECHNIQUE: The patient was scanned on a Philips 256 scanner. A 120 kV retrospective scan was triggered in the descending thoracic aorta at 111 HU's. Gantry rotation speed was 270 msecs and collimation was .9 mm. No beta blockade or nitro were given. The 3D data set was reconstructed in 5% intervals of the R-R cycle. Systolic and diastolic phases were analyzed on a  dedicated work station using MPR, MIP and VRT modes. The patient received 80 cc of contrast.  FINDINGS: Aortic Valve: Tri leaflet and heavily calcified. Moderate calcification of the STJ.  Aorta: Normal size and origin of great vessels. Moderate calcific atherosclerosis  Sinotubular Junction:  28 mm  Ascending Thoracic Aorta:  34 mm  Aortic  Arch:  32 mm  Descending Thoracic Aorta:  25 mm  Sinus of Valsalva Measurements:  Non-coronary:  33 mm  Right -coronary:  32 mm  Left -coronary:  32 mm  Coronary Artery Height above Annulus:  Left Main:  11.9 mm  Right Coronary:  11 mm  Virtual Basal Annulus Measurements:  Maximum/Minimum Diameter:  27 mm x 31.6 mm  Perimeter:  93 mm  Area:  652 mm2  Coronary Arteries:  Sufficient height above annulus for deployment  Optimum Fluoroscopic Angle for Delivery: LAO 11 degrees Caudal 10 degrees  IMPRESSION: 1) Severely calcified tri-leaflet aortic valve with annulus suitable for a 29 mm Sapien 3 valve  2) Coronary arteries sufficient height above annulus for deployment  3) Optimum angle for deployment LAO 11 CAU 10 degrees  4) No LAA thrombus  5) No Pericardial Effusion  6) MAC  7) Normal aortic root 3.4 cm  Jenkins Rouge   Electronically Signed   By: Jenkins Rouge M.D.   On: 02/16/2017 18:16   CT ANGIOGRAPHY CHEST, ABDOMEN AND PELVIS  TECHNIQUE: Multidetector CT imaging through the chest, abdomen and pelvis was performed using the standard protocol during bolus administration of intravenous contrast. Multiplanar reconstructed images and MIPs were obtained and reviewed to evaluate the vascular anatomy.  CONTRAST:  70 mL of Isovue 370.  COMPARISON:  No priors.  FINDINGS: CTA CHEST FINDINGS  Cardiovascular: Heart size is mildly enlarged with left ventricular dilatation. There is no significant pericardial fluid, thickening or pericardial calcification. There is aortic atherosclerosis, as well as atherosclerosis of the great vessels of the mediastinum and the coronary arteries, including calcified atherosclerotic plaque in the left main, left anterior descending, left circumflex and right coronary arteries. Severe thickening calcification of the aortic valve. Mild calcification of the mitral  annulus.  Mediastinum/Lymph Nodes: No pathologically enlarged mediastinal or hilar lymph nodes. Esophagus is unremarkable in appearance. No axillary lymphadenopathy.  Lungs/Pleura: A few scattered tiny pulmonary nodules are noted in the lungs bilaterally measuring up to 5 mm (axial image 133 of series 6) in the left lower lobe. No larger more suspicious appearing pulmonary nodules or masses are noted. Mild linear scarring in the periphery of the right upper lobe. No acute consolidative airspace disease. No pleural effusions.  Musculoskeletal/Soft Tissues: There are no aggressive appearing lytic or blastic lesions noted in the visualized portions of the skeleton.  CTA ABDOMEN AND PELVIS FINDINGS  Hepatobiliary: No cystic or solid hepatic lesions. No intra or extrahepatic biliary ductal dilatation. Multiple small calcified gallstones lie dependently in the gallbladder. No findings to suggest acute cholecystitis noted at this time.  Pancreas: No pancreatic mass. No pancreatic ductal dilatation. No pancreatic or peripancreatic fluid or inflammatory changes.  Spleen: Unremarkable.  Adrenals/Urinary Tract: Exophytic 3.4 cm low-attenuation nonenhancing lesion in the lower pole of the left kidney is compatible with a simple cyst. Right kidney and bilateral adrenal glands are normal in appearance. There is no hydroureteronephrosis. Urinary bladder is normal in appearance.  Stomach/Bowel: Normal appearance of the stomach. No pathologic dilatation of small bowel or colon. Status post appendectomy.  Vascular/Lymphatic: Aortic atherosclerosis, without evidence of aneurysm or dissection in the abdominal  or pelvic vasculature. Vascular findings and measurements pertinent to potential TAVR procedure, as detailed below. Celiac axis, superior mesenteric artery and inferior mesenteric artery are all patent without definite hemodynamically significant stenosis. Single right and  2 left renal arteries are patent. No lymphadenopathy noted in the abdomen or pelvis.  Reproductive: Prostate gland and seminal vesicles are unremarkable in appearance.  Other: No significant volume of ascites.  No pneumoperitoneum.  Musculoskeletal: There are no aggressive appearing lytic or blastic lesions noted in the visualized portions of the skeleton.  VASCULAR MEASUREMENTS PERTINENT TO TAVR:  AORTA:  Minimal Aortic Diameter -  15 x 14 mm  Severity of Aortic Calcification -  severe  RIGHT PELVIS:  Right Common Iliac Artery -  Minimal Diameter - 9.9 x 5.3 mm  Tortuosity - moderate  Calcification - severe  Right External Iliac Artery -  Minimal Diameter - 8.8 x 8.3 mm  Tortuosity - moderate  Calcification - mild  Right Common Femoral Artery -  Minimal Diameter - 9.5 x 7.0 mm  Tortuosity - mild  Calcification - moderate  LEFT PELVIS:  Left Common Iliac Artery -  Minimal Diameter - 7.9 x 7.7 mm  Tortuosity - mild  Calcification - severe  Left External Iliac Artery -  Minimal Diameter - 7.7 x 8.8 mm  Tortuosity - severe  Calcification - mild  Left Common Femoral Artery -  Minimal Diameter - 8.5 x 7.8 mm  Tortuosity - mild  Calcification - mild  Review of the MIP images confirms the above findings.  IMPRESSION: 1. Vascular findings and measurements pertinent to potential TAVR procedure, as detailed above. This patient does appear to have suitable pelvic arterial access bilaterally. However, given the extensive calcification and asymmetric lumen of the right common iliac artery, vascular access may be more preferable on the left side (despite the greater tortuosity of the left external iliac artery). 2. Severe thickening calcification of the aortic valve, compatible with the reported clinical history of severe aortic stenosis. 3. Aortic atherosclerosis, in addition to left main and 3 vessel coronary  artery disease. Please note that although the presence of coronary artery calcium documents the presence of coronary artery disease, the severity of this disease and any potential stenosis cannot be assessed on this non-gated CT examination. Assessment for potential risk factor modification, dietary therapy or pharmacologic therapy may be warranted, if clinically indicated. 4. Cardiomegaly with left ventricular dilatation. 5. Multiple tiny pulmonary nodules scattered throughout the lung bases measuring 5 mm or less in size. These are nonspecific, but statistically likely benign. No follow-up needed if patient is low-risk (and has no known or suspected primary neoplasm). Non-contrast chest CT can be considered in 12 months if patient is high-risk. This recommendation follows the consensus statement: Guidelines for Management of Incidental Pulmonary Nodules Detected on CT Images: From the Fleischner Society 2017; Radiology 2017; 284:228-243. 6. Cholelithiasis without evidence of acute cholecystitis at this time. 7. Additional incidental findings, as above.   Electronically Signed   By: Vinnie Langton M.D.   On: 02/16/2017 12:32   STS Risk Calculator  Procedure    AVR + CABG  Risk of Mortality   4.2% Morbidity or Mortality  28.2% Prolonged LOS   16.6% Short LOS    16.9% Permanent Stroke   2.1% Prolonged Vent Support  17.9% DSW Infection    1.1% Renal Failure    11.1% Reoperation    9.3%       Impression:  Patient has stage D severe symptomatic  paradoxical low flow normal ejection fraction aortic stenosis. He presents with progressive symptoms of exertional shortness of breath and fatigue consistent with chronic diastolic congestive heart failure, [place] Heart Association functional class IIb.  I have personally reviewed the patient's transthoracic echocardiogram, dobutamine stress echocardiogram, diagnostic cardiac catheterization, and CT angiograms. Echocardiograms  revealed the presence of a relatively large size aortic valve with severe calcification, thickening, and severely restricted leaflet mobility involving all 3 leaflets of the aortic valve.  Visually the degree of aortic stenosis appears quite severe.  The DVI was reported 0.27. At rest peak velocity across the aortic valve ranges between 3.5 and 3.8 m/s, but with dobutamine stress peak velocity increases quickly with peak velocity recorded greater than 4.6 m/s corresponding to mean transvalvular gradient measured greater than 50 mmHg. Left ventricular systolic function remains normal. Diagnostic cardiac catheterization is notable for the presence of moderate coronary artery disease with an percent occlusion of the small second diagonal branch and 50-90% eccentric stenosis of the first portion of the left posterior descending coronary artery. Risks associated with conventional surgical aortic valve replacement with or without coronary artery bypass grafting would be at least moderately elevated because of the patient's advanced age and numerous comorbid medical problems. The patient does not have any symptoms of angina pectoris and I feel that his coronary artery disease could be managed medically.  Cardiac-gated CTA of the heart reveals anatomical characteristics consistent with aortic stenosis suitable for treatment by transcatheter aortic valve replacement without any significant complicating features and CTA of the aorta and iliac vessels demonstrate what appears to be adequate pelvic vascular access to facilitate a transfemoral approach.    Plan:  The patient and his wife were counseled at length regarding treatment alternatives for management of severe symptomatic aortic stenosis. Alternative approaches such as conventional aortic valve replacement, transcatheter aortic valve replacement, and palliative medical therapy were compared and contrasted at length.  The risks associated with conventional surgical  aortic valve replacement were been discussed in detail, as were expectations for post-operative convalescence.  Issues specific to transcatheter aortic valve replacement were discussed including questions about long term valve durability, the potential for paravalvular leak, possible increased risk of need for permanent pacemaker placement, and other technical complications related to the procedure itself.  Long-term prognosis with medical therapy was discussed. This discussion was placed in the context of the patient's own specific clinical presentation and past medical history.  All of their questions been addressed.  The patient is eager to proceed with transcatheter aortic valve replacement as an alternative to conventional surgery in the near future. We tentatively plan to proceed with transcatheter aortic valve replacement on 03/15/2017.  Following the decision to proceed with transcatheter aortic valve replacement, a discussion has been held regarding what types of management strategies would be attempted intraoperatively in the event of life-threatening complications, including whether or not the patient would be considered a candidate for the use of cardiopulmonary bypass and/or conversion to open sternotomy for attempted surgical intervention.  The patient has been advised of a variety of complications that might develop including but not limited to risks of death, stroke, paravalvular leak, aortic dissection or other major vascular complications, aortic annulus rupture, device embolization, cardiac rupture or perforation, mitral regurgitation, acute myocardial infarction, arrhythmia, heart block or bradycardia requiring permanent pacemaker placement, congestive heart failure, respiratory failure, renal failure, pneumonia, infection, other late complications related to structural valve deterioration or migration, or other complications that might ultimately cause a temporary or  permanent loss of  functional independence or other long term morbidity.  The patient provides full informed consent for the procedure as described and all questions were answered.    I spent in excess of 90 minutes during the conduct of this office consultation and >50% of this time involved direct face-to-face encounter with the patient for counseling and/or coordination of their care.     Valentina Gu. Roxy Manns, MD 03/01/2017 2:37 PM

## 2017-03-01 NOTE — Patient Instructions (Addendum)
Stop taking Metformin 1 day prior to surgery  Continue taking all other current medications without change through the day before surgery.  Have nothing to eat or drink after midnight the night before surgery.  On the morning of surgery take only Norvasc and Benicar with a sip of water.

## 2017-03-10 ENCOUNTER — Institutional Professional Consult (permissible substitution) (INDEPENDENT_AMBULATORY_CARE_PROVIDER_SITE_OTHER): Payer: Medicare Other | Admitting: Surgery

## 2017-03-10 ENCOUNTER — Encounter: Payer: Self-pay | Admitting: Surgery

## 2017-03-10 VITALS — BP 137/73 | HR 73 | Resp 20 | Ht 69.0 in | Wt 225.0 lb

## 2017-03-10 DIAGNOSIS — I350 Nonrheumatic aortic (valve) stenosis: Secondary | ICD-10-CM | POA: Diagnosis not present

## 2017-03-10 NOTE — Progress Notes (Signed)
Patient ID: Todd Romero, male   DOB: 01/03/1934, 83 y.o.   MRN: [id number]  [facility]  MULTIDISCIPLINARY HEART VALVE CLINIC  CARDIOTHORACIC SURGERY CONSULTATION REPORT  Referring Provider is Belva Crome, MD PCP is [name], MD  Chief Complaint  Patient presents with  . Aortic Stenosis    2nd TAVR eval, review all studes, surgery scheduled for 03/15/17    HPI:  The patient is an 83 year old gentleman with hypertension, obesity, DM, OSA but did not tolerate CPAP, chronic diastolic heart failure, degenerative arthritis particularly of his right knee and known aortic stenosis. His echo on 01/05/2016 showed moderate AS with a mean gradient of 29 mm Hg and normal LV function. Over the past year he has developed progressive exertional shortness of breath and fatigue. He has not had any chest discomfort or dizziness. He has had chronic LE edema and wears compression socks. He had a follow up echo on 10/01/2016 that showed a mean gradient of 28 mm Hg and a normal LVEF of 55-60% that was unchanged. Cardiac cath on 12/28/2016 showed moderate CAD with 100% occlusion of a small D2, 30-50% LAD stenosis, 90% stenosis of a small OM4 and 90% stenosis of the left PDA. The RCA had 50% proximal stenosis. The mean transvalvular gradient was 29 mm Hg. He was evaluated by Dr Angelena Form and had a dobutamine stress echo confirming low flow, normal EF severe AS with an increase in the mean gradient from 36 to 58 mm Hg with peak stress.  The patient is here with his wife today. He is from [place] and moved here in 2004. He has been fairly active until the past year when he has been limited due to fatigue and shortness of breath with exertion. He has severe arthritis in the right knee that he has been managing with injections but may need surgery.   Past Medical History:  Diagnosis Date  . Aortic stenosis   . CAD (coronary artery disease)   . Diabetes ([place])   . Hyperlipidemia   . Obesity (BMI  30-39.9) 06/18/2015  . OSA (obstructive sleep apnea)    Severe with AHI 27/hr now on CPAP  . Pure hypercholesterolemia   . RBBB   . Type II or unspecified type diabetes mellitus without mention of complication, not stated as uncontrolled   . Unspecified essential hypertension     Past Surgical History:  Procedure Laterality Date  . CARPAL TUNNEL RELEASE    . KNEE CARTILAGE SURGERY    . PARTIAL COLECTOMY    . RIGHT/LEFT HEART CATH AND CORONARY ANGIOGRAPHY N/A 12/28/2016   Procedure: Right/Left Heart Cath and Coronary Angiography;  Surgeon: Belva Crome, MD;  Location: [facility];  Service: Cardiovascular;  Laterality: N/A;  . VEIN SURGERY      Family History  Problem Relation Age of Onset  . Heart failure Father   . Emphysema Father   . Hypertension Mother   . Cancer Sister   . Healthy Sister     Social History   Social History  . Marital status: Married    Spouse name: N/A  . Number of children: 4  . Years of education: N/A   Occupational History  . Construction-Retired    Social History Main Topics  . Smoking status: Never Smoker  . Smokeless tobacco: Never Used  . Alcohol use No  . Drug use: No  . Sexual activity: Not on file   Other Topics Concern  .  Not on file   Social History Narrative  . No narrative on file    Current Outpatient Prescriptions  Medication Sig Dispense Refill  . ACCU-CHEK AVIVA PLUS test strip USE AS INSTRUCTED TO CHECK BLOOD SUGAR TWICE A DAY 200 each 2  . acetaminophen (TYLENOL) 500 MG tablet Take 500 mg by mouth every 4 (four) hours as needed for moderate pain or headache.    [name] amLODipine (NORVASC) 10 MG tablet Take 5 mg by mouth daily.    [name] aspirin EC 81 MG tablet Take 81 mg by mouth daily.    . B-D INS SYR ULTRAFINE 1CC/31G 31G X 5/16" 1 ML MISC USE TO INJECT INSULIN DAILY 100 each 3  . ferrous sulfate 325 (65 FE) MG tablet Take 325 mg by mouth daily with breakfast.    . fish oil-omega-3 fatty acids 1000 MG capsule Take  1 g by mouth daily at 12 noon.     . furosemide (LASIX) 40 MG tablet Take 40 mg by mouth daily at 12 noon.     [name] glimepiride (AMARYL) 2 MG tablet Take 0.5 tablets (1 mg total) by mouth daily. 45 tablet 1  . insulin lispro (HUMALOG KWIKPEN) 100 UNIT/ML KiwkPen 4-8 units before the main meals of the day (Patient taking differently: Inject 4-8 Units into the skin daily after supper. 4-8 units before the main meals of the day) 15 mL 1  . insulin NPH Human (HUMULIN N,NOVOLIN N) 100 UNIT/ML injection Inject 0.22 mLs (22 Units total) into the skin at bedtime. Uses Vial (Patient taking differently: Inject 26 Units into the skin at bedtime. Uses Vial) 20 mL 1  . Insulin Pen Needle (B-D UF III MINI PEN NEEDLES) 31G X 5 MM MISC USE 2 PEN NEEDLE PER DAY WITH HUMALOG AND VICTOZA 200 each 3  . liraglutide 18 MG/3ML SOPN Inject 0.3 mLs (1.8 mg total) into the skin at bedtime. 15 pen 2  . metFORMIN (GLUMETZA) 500 MG (MOD) 24 hr tablet Take 500 mg by mouth 2 (two) times daily with a meal. (morning & noon)    . Multiple Vitamin (MULTIVITAMIN WITH MINERALS) TABS tablet Take 1 tablet by mouth daily.    . Multiple Vitamins-Minerals (PRESERVISION AREDS 2 PO) Take 1 capsule by mouth 2 (two) times daily.    [name] olmesartan (BENICAR) 40 MG tablet Take 1 tablet (40 mg total) by mouth daily. 90 tablet 3  . pravastatin (PRAVACHOL) 80 MG tablet Take 80 mg by mouth every evening.     [name] spironolactone (ALDACTONE) 25 MG tablet Take 25 mg by mouth daily at 6 PM.     . tamsulosin (FLOMAX) 0.4 MG CAPS capsule TAKE 1 CAPSULE DAILY (Patient taking differently: TAKE 1 CAPSULE DAILY IN THE EVENING) 90 capsule 2   No current facility-administered medications for this visit.     Allergies  Allergen Reactions  . Morphine And Related Nausea And Vomiting      Review of Systems:   General:                      normal appetite, decreased energy, no weight gain, no weight loss, no fever             Cardiac:                       no chest  pain with exertion, no chest pain at rest, + SOB with exertion, no resting SOB, no PND, no orthopnea,  no palpitations, no arrhythmia, no atrial fibrillation, + LE edema, no dizzy spells, no syncope             Respiratory:                 + exertional shortness of breath, no home oxygen, no productive cough, no dry cough, no bronchitis, no wheezing, no hemoptysis, no asthma, no pain with inspiration or cough, + sleep apnea, no CPAP at night             GI:                               no difficulty swallowing, no reflux, no frequent heartburn, no hiatal hernia, no abdominal pain, no constipation, no diarrhea, no hematochezia, no hematemesis, no melena             GU:                              no dysuria,  + frequency, no urinary tract infection, no hematuria, no enlarged prostate, no kidney stones, no kidney disease             Vascular:                     no pain suggestive of claudication, no pain in feet, no leg cramps, no varicose veins, no DVT, no non-healing foot ulcer             Neuro:                         no stroke, no TIA's, no seizures, no headaches, no temporary blindness one eye,  no slurred speech, no peripheral neuropathy, + chronic pain, mild instability of gait, no memory/cognitive dysfunction             Musculoskeletal:         + arthritis, + joint swelling, no myalgias, + difficulty walking, mildly limited mobility              Skin:                            no rash, no itching, no skin infections, no pressure sores or ulcerations             Psych:                         no anxiety, no depression, no nervousness, no unusual recent stress             Eyes:                           no blurry vision, + floaters, no recent vision changes, no wears glasses or contacts             ENT:                            + hearing loss, no loose or painful teeth, no dentures, last saw dentist 01/2016             Hematologic:               no easy bruising,  no abnormal bleeding, no  clotting disorder, no frequent epistaxis             Endocrine:                   + diabetes, does check CBG's at home                   Physical Exam:   BP 137/73   Pulse 73   Resp 20   Ht _0  (1.753 m)   Wt 225 lb (102.1 kg)   SpO2 97% Comment: RA  BMI 33.23 kg/m   General:  Obese, well-appearing  HEENT:  Unremarkable, NCAT, PERLA, EOMI, oropharynx clear. Teeth in good condition.  Neck:   no JVD, no bruits, no adenopathy or thyromegaly  Chest:   clear to auscultation, symmetrical breath sounds, no wheezes, no rhonchi   CV:   RRR, grade III/VI crescendo/decrescendo murmur heard best at RSB,  no diastolic murmur  Abdomen:  soft, non-tender, no masses or organomegaly  Extremities:  warm, well-perfused, pulses diminished but palpable in feet , mild LE edema  Rectal/GU  Deferred  Neuro:   Grossly non-focal and symmetrical throughout  Skin:   Clean and dry, no rashes, no breakdown   Diagnostic Tests:  [facility]                        [address], [place] [address]                            [phone]  ------------------------------------------------------------------- Stress Echocardiography  Patient:    Elan, Brainerd MR #:       [id number] Study Date: 02/07/2017 Gender:     M Age:        24 Height:     175.3 cm Weight:     107 kg BSA:        2.32 m^2 Pt. Status: Room:   ATTENDING    Mertie Moores, M.D.  ORDERING     McAlhany, Christopher  REFERRING    McAlhany, [place], Outpatient  SONOGRAPHER  [facility], RDCS  cc:  ------------------------------------------------------------------- LV EF: 55% -   60%  ------------------------------------------------------------------- Indications:      Aortic Stenosis (I35.0).  ------------------------------------------------------------------- History:   PMH:  Obesity, Obstructive Sleep Apnea, Moderate Aortic Stenosis Pre-op  evaluation for TAVR  Dyspnea.  Coronary artery disease.  Aortic valve disease.  Risk factors:  Family history of coronary artery disease. Hypertension. Diabetes mellitus. Dyslipidemia.  Medications:  No other medications.  ------------------------------------------------------------------- Study Conclusions  - Left ventricle: The cavity size was normal. Systolic function was   normal. The estimated ejection fraction was in the range of 55%   to 60%. - Aortic valve: There was severe stenosis. Valve area (VTI): 1.46   cm^2. Valve area (Vmax): 1.51 cm^2. Valve area (Vmean): 1.33   cm^2. - Left atrium: The atrium was moderately dilated.  Impressions:  - Severe aortic stenosis with increase of mean AV gradient from   baseline of 36 to 58 with Dobutamine stress.  ------------------------------------------------------------------- Study data:   Study status:  Routine.  Consent:  The risks, benefits, and alternatives to the procedure were explained to the patient and informed consent was obtained.  Procedure:  Initial setup. The patient was brought to the laboratory. A baseline ECG was recorded. Surface ECG leads and automatic cuff blood pressure measurements were monitored.  Dobutamine stress test. Stress testing was performed, with dobutamine infusion from 5 to 20 mcg/kg/min by 10 mcg/kg/min increments. The infusion was terminated due to maximal dose administration. Transthoracic stress echocardiography for left ventricular function evaluation, assessment of valvular function, and preoperative diagnosis. Images were captured at baseline, low dose, peak dose, and recovery. Study completion:  The patient tolerated the procedure well. There were no complications.          Dobutamine. Stress echocardiography.  2D.  Birthdate:  Patient birthdate: 1933-11-22. Age:  Patient is 83 yr old.  Sex:  Gender: male.    BMI: 34.8 kg/m^2.  Blood pressure:     134/76  Patient status:   Outpatient. Study date:  Study date: 02/07/2017. Study time: 02:54 PM.  -------------------------------------------------------------------  ------------------------------------------------------------------- Left ventricle:  The cavity size was normal. Systolic function was normal. The estimated ejection fraction was in the range of 55% to 60%.  ------------------------------------------------------------------- Aortic valve:   Doppler:   There was severe stenosis.      VTI ratio of LVOT to aortic valve: 0.28. Valve area (VTI): 1.46 cm^2. Indexed valve area (VTI): 0.63 cm^2/m^2. Peak velocity ratio of LVOT to aortic valve: 0.28. Valve area (Vmax): 1.51 cm^2. Indexed valve area (Vmax): 0.65 cm^2/m^2. Mean velocity ratio of LVOT to aortic valve: 0.25. Valve area (Vmean): 1.33 cm^2. Indexed valve area (Vmean): 0.57 cm^2/m^2.    Mean gradient (S): 50 mm Hg. Peak gradient (S): 84 mm Hg.  ------------------------------------------------------------------- Aorta:  The aorta was mildly dilated.  ------------------------------------------------------------------- Left atrium:  The atrium was moderately dilated.   ------------------------------------------------------------------- Stress protocol:  +-----------------------+---------------+--+------------+--------+ !Stage                  !Time into phase!HR!BP (mmHg)   !Symptoms! +-----------------------+---------------+--+------------+--------+ !Baseline               !---------------!63!134/76 (95) !None    ! +-----------------------+---------------+--+------------+--------+ !Dobutamine 5 ug/kg/min !3 min          !63!134/66 (89) !--------! +-----------------------+---------------+--+------------+--------+ !Dobutamine 10 ug/kg/min!3 min          !66!142/67 (92) !--------! +-----------------------+---------------+--+------------+--------+ !Dobutamine 20 ug/kg/min!3 min          !94!114/54 (74)  !--------! +-----------------------+---------------+--+------------+--------+ !Immediate post stress  !---------------!96!116/55 (75) !--------! +-----------------------+---------------+--+------------+--------+ !Recovery; 1 min        !---------------!85!152/75 (101)!--------! +-----------------------+---------------+--+------------+--------+ !Recovery; 5 min        !---------------!42!595/63 (99) !--------! +-----------------------+---------------+--+------------+--------+ !Late recovery          !---------------!87!564/33 (96) !--------! +-----------------------+---------------+--+------------+--------+  ------------------------------------------------------------------- Stress results:   Maximal heart rate during stress was 96 bpm (69% of maximal predicted heart rate). The maximal predicted heart rate was 140 bpm.The heart rate response to stress was normal. The rate-pressure product for the peak heart rate and blood pressure was 12920 mm Hg/min.  ------------------------------------------------------------------- Baseline:  - LV global systolic function was normal. - Normal wall motion; no LV regional wall motion abnormalities.  Low dose:  - LV global systolic function was at the upper limits of normal. - No evidence for new LV regional wall motion abnormalities.  Peak stress:  - LV global systolic function was vigorous. - No evidence for new LV regional wall motion abnormalities.  Recovery:  - LV global systolic function was vigorous. - No evidence for new LV regional wall motion abnormalities.  ------------------------------------------------------------------- Post procedure conclusions Ascending Aorta:  -  The aorta was mildly dilated.  ------------------------------------------------------------------- Stress echo results:     With Dobutamine infusion, the mean AV gradient increased from 36 mmHg to 58.  Severe Aortic stenosis with increasing  gradient with Dobutamine stress.  ------------------------------------------------------------------- Measurements   Left ventricle                            Value          Reference  LV ID, ED, PLAX chordal           (H)     56.4  mm       43 - 52  LV ID, ES, PLAX chordal                   37.8  mm       23 - 38  LV fx shortening, PLAX chordal            33    %        >=29  LV PW thickness, ED                       10.9  mm       ---------  IVS/LV PW ratio, ED                       1.08           <=1.3  Stroke volume, 2D                         143   ml       ---------  Stroke volume/bsa, 2D                     62    ml/m^2   ---------    Ventricular septum                        Value          Reference  IVS thickness, ED                         11.8  mm       ---------    LVOT                                      Value          Reference  LVOT ID, S                                26    mm       ---------  LVOT area                                 5.31  cm^2     ---------  LVOT peak velocity, S                     130   cm/s     ---------  LVOT mean velocity, S  82.7  cm/s     ---------  LVOT VTI, S                               26.9  cm       ---------  LVOT peak gradient, S                     7     mm Hg    ---------    Aortic valve                              Value          Reference  Aortic valve peak velocity, S             457   cm/s     ---------  Aortic valve mean velocity, S             330   cm/s     ---------  Aortic valve VTI, S                       97.8  cm       ---------  Aortic mean gradient, S                   50    mm Hg    ---------  Aortic peak gradient, S                   84    mm Hg    ---------  VTI ratio, LVOT/AV                        0.28           ---------  Aortic valve area, VTI                    1.46  cm^2     ---------  Aortic valve area/bsa, VTI                0.63  cm^2/m^2 ---------  Velocity ratio, peak, LVOT/AV              0.28           ---------  Aortic valve area, peak velocity          1.51  cm^2     ---------  Aortic valve area/bsa, peak               0.65  cm^2/m^2 ---------  velocity  Velocity ratio, mean, LVOT/AV             0.25           ---------  Aortic valve area, mean velocity          1.33  cm^2     ---------  Aortic valve area/bsa, mean               0.57  cm^2/m^2 ---------  velocity    Aorta                                     Value          Reference  Aortic root ID, ED  41    mm       ---------  Ascending aorta ID, A-P, S                37    mm       ---------    Left atrium                               Value          Reference  LA ID, A-P, ES                            52    mm       ---------  LA ID/bsa, A-P                    (H)     2.24  cm/m^2   <=2.2  Legend: (L)  and  (H)  mark values outside specified reference range.  ------------------------------------------------------------------- Prepared and Electronically Authenticated by  Mertie Moores, M.D. 2018-06-04T17:04:13   Physicians   Panel Physicians Referring Physician Case Authorizing Physician  Belva Crome, MD (Primary)    Procedures   Right/Left Heart Cath and Coronary Angiography  Conclusion    Difficult procedure due to severe angulation in the right innominate/ascending aortic region that produced catheter resistance to torque and advancement.  Moderate aortic stenosis with a transvalvular gradient of 29 mmHg (mean) and 33-38 mmHg (peak to peak). Calculated aortic valve area 1.35 cm.  Elevated left ventricular end-diastolic pressure and pulmonary capillary wedge pressure. Known LV systolic function with EF greater than 50% by recent echo suggesting chronic diastolic left heart failure.  Heavy three-vessel coronary calcification.  100% stenosis in the mid body of the second diagonal. There is diffuse 30-50% narrowing in the LAD.  The circumflex coronary is dominant.  Moderate to heavy calcification is noted throughout the distal vessel and in the proximal segment. There is eccentric 50-90% stenosis proximal to the origin of the L-PDA.  Nondominant right coronary. Not selectively engaged due to poor catheter control. The vessel is patent and supplies only the right ventricle.  RECOMMENDATIONS:   Coronary status is essentially unchanged compared to 5 years ago.  Moderate aortic stenosis based upon hemodynamics obtained from this case. This matches the recent echo.  Explanation for exertional fatigue is uncertain. Consider referral to the valve clinic for a second opinion concerning management of aortic stenosis. May need dobutamine echo as he is potentially a low gradient severe aortic stenosis substrate.  Indications   Aortic stenosis, moderate [I35.0 (ICD-10-CM)]  Coronary artery disease involving native coronary artery of native heart without angina pectoris [I25.10 (ICD-10-CM)]  Procedural Details/Technique   Technical Details The right radial area was sterilely prepped and draped. Intravenous sedation with Versed and fentanyl was administered. 1% Xylocaine was infiltrated to achieve local analgesia. Using real time vascular ultrasound, a double wall stick with an angiocath was utilized to obtain intra-arterial access. The modified Seldinger technique was used to place a 69F " Slender" sheath in the right radial artery. Weight based heparin was administered. Coronary angiography was done using 5 F catheters. The procedure was very difficult due to significant angulation and tortuosity in the innominate/aortic bifurcation. Right coronary angiography was performed in multiple catheters. Selective engagement was not possible. The vessel is nondominant, patent, and appears to have diffuse disease. Left ventricular hemodymic recordings were  done using the JR 4 catheter.. Left coronary angiography was performed with a JL 3.5 cm.  Right heart catheterization was  performed by exchanging an antecubital angiocath IV for a 5 French brachial sheath. 1% Xylocaine local infiltration at the IV site was given prior to sheath exchange. A double glove technique was used. The modified Seldinger technique was employed. After sheath insertion, right heart cath was performed using a 5 French balloon tipped catheter. Pressures were recorded in each chamber. A main pulmonary artery O2 saturation was obtained. A wedge pressure was also measured.  Right radial hemostasis was achieved with a compression/pneumatic bandage.  During this procedure the patient is administered a total of Versed 1 mg and Fentanyl 50 mg to achieve and maintain moderate conscious sedation. The patient's heart rate, blood pressure, and oxygen saturation are monitored continuously during the procedure. The period of conscious sedation is 60 minutes, of which I was present face-to-face 100% of this time.   Estimated blood loss <50 mL.  During this procedure the patient was administered the following to achieve and maintain moderate conscious sedation: Versed 1 mg, Fentanyl 50 mcg, while the patient's heart rate, blood pressure, and oxygen saturation were continuously monitored. The period of conscious sedation was 60 minutes, of which I was present face-to-face 100% of this time.    Coronary Findings   Dominance: Left  Left Anterior Descending  Prox LAD to Mid LAD lesion, 50% stenosed.  First Diagonal Branch  Vessel is small in size.  Second Diagonal [employer] 2nd Diag lesion, 75% stenosed.  2nd Diag lesion, 100% stenosed. The lesion is calcified.  Left Circumflex  Mid Cx lesion, 40% stenosed.  First Obtuse Marginal Branch  Vessel is small in size.  Third Obtuse Marginal Branch  Vessel is small in size.  3rd Mrg lesion, 60% stenosed.  Fourth Obtuse Marginal Branch  Vessel is small in size.  Ost 4th Mrg to 4th Mrg lesion, 90% stenosed.  Left Posterior Descending Artery  Ost LPDA to LPDA  lesion, 90% stenosed. The lesion is calcified.  Right Coronary Artery  Ost RCA to Mid RCA lesion, 50% stenosed. The lesion is segmental.  Right Heart   Right Heart Pressures Hemodynamic findings consistent with mild pulmonary hypertension.    Left Heart   Left Ventricle LV end diastolic pressure is moderately elevated.    Coronary Diagrams   Diagnostic Diagram       Implants     No implant documentation for this case.  PACS Images   Show images for Cardiac catheterization   Link to Procedure Log   Procedure Log    Hemo Data    Most Recent Value  Fick Cardiac Output 6.32 L/min  Fick Cardiac Output Index 2.86 (L/min)/BSA  Aortic Mean Gradient 29 mmHg  Aortic Peak Gradient 31 mmHg  Aortic Valve Area 1.35  Aortic Value Area Index 0.61 cm2/BSA  RA A Wave 12 mmHg  RA V Wave 10 mmHg  RA Mean 9 mmHg  RV Systolic Pressure 44 mmHg  RV Diastolic Pressure 4 mmHg  RV EDP 12 mmHg  PA Systolic Pressure 48 mmHg  PA Diastolic Pressure 11 mmHg  PA Mean 25 mmHg  PW A Wave 24 mmHg  PW V Wave 27 mmHg  PW Mean 22 mmHg  AO Systolic Pressure 062 mmHg  AO Diastolic Pressure 62 mmHg  AO Mean 88 mmHg  LV Systolic Pressure 694 mmHg  LV Diastolic Pressure 10 mmHg  LV EDP 29 mmHg  Arterial  Occlusion Pressure Extended Systolic Pressure 811 mmHg  Arterial Occlusion Pressure Extended Diastolic Pressure 58 mmHg  Arterial Occlusion Pressure Extended Mean Pressure 86 mmHg  Left Ventricular Apex Extended Systolic Pressure 914 mmHg  Left Ventricular Apex Extended Diastolic Pressure 11 mmHg  Left Ventricular Apex Extended EDP Pressure 21 mmHg  QP/QS 1  TPVR Index 8.74 HRUI  TSVR Index 30.77 HRUI  PVR SVR Ratio 0.04  TPVR/TSVR Ratio 0.28   CT CORONARY MORPH W/CTA COR W/SCORE W/CA W/CM &/OR WO/CM (Accession [id number]) (Order [id number])  Imaging  Date: 02/16/2017 Department: [facility] CT IMAGING Released By: Reggy Eye Authorizing: Burnell Blanks, MD   Exam Information   Status Exam Begun  Exam Ended   Final [99] 02/16/2017 10:09 AM 02/16/2017 11:01 AM  PACS Images   Show images for CT CORONARY MORPH W/CTA COR W/SCORE W/CA W/CM &/OR WO/CM  Addendum   ADDENDUM REPORT: 02/16/2017 18:16  CLINICAL DATA:  Aortic stenosis  EXAM: Cardiac TAVR CT  TECHNIQUE: The patient was scanned on a Philips 256 scanner. A 120 kV retrospective scan was triggered in the descending thoracic aorta at 111 HU's. Gantry rotation speed was 270 msecs and collimation was .9 mm. No beta blockade or nitro were given. The 3D data set was reconstructed in 5% intervals of the R-R cycle. Systolic and diastolic phases were analyzed on a dedicated work station using MPR, MIP and VRT modes. The patient received 80 cc of contrast.  FINDINGS: Aortic Valve: Tri leaflet and heavily calcified. Moderate calcification of the STJ.  Aorta: Normal size and origin of great vessels. Moderate calcific atherosclerosis  Sinotubular Junction:  28 mm  Ascending Thoracic Aorta:  34 mm  Aortic Arch:  32 mm  Descending Thoracic Aorta:  25 mm  Sinus of Valsalva Measurements:  Non-coronary:  33 mm  Right -coronary:  32 mm  Left -coronary:  32 mm  Coronary Artery Height above Annulus:  Left Main:  11.9 mm  Right Coronary:  11 mm  Virtual Basal Annulus Measurements:  Maximum/Minimum Diameter:  27 mm x 31.6 mm  Perimeter:  93 mm  Area:  652 mm2  Coronary Arteries:  Sufficient height above annulus for deployment  Optimum Fluoroscopic Angle for Delivery: LAO 11 degrees Caudal 10 degrees  IMPRESSION: 1) Severely calcified tri-leaflet aortic valve with annulus suitable for a 29 mm Sapien 3 valve  2) Coronary arteries sufficient height above annulus for deployment  3) Optimum angle for deployment LAO 11 CAU 10 degrees  4) No LAA thrombus  5) No Pericardial Effusion  6) MAC  7) Normal aortic root 3.4 cm  Jenkins Rouge   Electronically Signed   By: Jenkins Rouge M.D.   On: 02/16/2017 18:16   Addended by Josue Hector, MD on 02/16/2017 6:18 PM    Study Result   EXAM: OVER-READ INTERPRETATION  CT CHEST  The following report is an over-read performed by radiologist Dr. Rebekah [facility] on 02/16/2017. This over-read does not include interpretation of cardiac or coronary anatomy or pathology. The coronary calcium score/coronary CTA interpretation by the cardiologist is attached.  COMPARISON:  None.  FINDINGS: Extracardiac findings will be described under separate dictation for contemporaneously obtained CTA of the chest, abdomen and pelvis.  IMPRESSION: Please see separate dictation for contemporaneously obtained CTA of the chest, abdomen and pelvis dated 02/16/2017 for full description of extracardiac findings.  Electronically Signed: By: Vinnie Langton M.D. On: 02/16/2017 11:23  CT Angio Abd/Pel w/ and/or w/o (Accession [id number]) (Order [id number])  Imaging  Date: 02/16/2017 Department: [facility] CT IMAGING Released By: Reggy Eye Authorizing: Burnell Blanks, MD  Exam Information   Status Exam Begun  Exam Ended   Final [99] 02/16/2017 10:11 AM 02/16/2017 11:07 AM  PACS Images   Show images for CT Angio Abd/Pel w/ and/or w/o  Study Result   CLINICAL DATA:  83 year old male with history of severe aortic stenosis. Preprocedural study prior to potential transcatheter aortic valve replacement (TAVR) procedure.  EXAM: CT ANGIOGRAPHY CHEST, ABDOMEN AND PELVIS  TECHNIQUE: Multidetector CT imaging through the chest, abdomen and pelvis was performed using the standard protocol during bolus administration of intravenous contrast. Multiplanar reconstructed images and MIPs were obtained and reviewed to evaluate the vascular anatomy.  CONTRAST:  70 mL of Isovue 370.  COMPARISON:  No  priors.  FINDINGS: CTA CHEST FINDINGS  Cardiovascular: Heart size is mildly enlarged with left ventricular dilatation. There is no significant pericardial fluid, thickening or pericardial calcification. There is aortic atherosclerosis, as well as atherosclerosis of the great vessels of the mediastinum and the coronary arteries, including calcified atherosclerotic plaque in the left main, left anterior descending, left circumflex and right coronary arteries. Severe thickening calcification of the aortic valve. Mild calcification of the mitral annulus.  Mediastinum/Lymph Nodes: No pathologically enlarged mediastinal or hilar lymph nodes. Esophagus is unremarkable in appearance. No axillary lymphadenopathy.  Lungs/Pleura: A few scattered tiny pulmonary nodules are noted in the lungs bilaterally measuring up to 5 mm (axial image 133 of series 6) in the left lower lobe. No larger more suspicious appearing pulmonary nodules or masses are noted. Mild linear scarring in the periphery of the right upper lobe. No acute consolidative airspace disease. No pleural effusions.  Musculoskeletal/Soft Tissues: There are no aggressive appearing lytic or blastic lesions noted in the visualized portions of the skeleton.  CTA ABDOMEN AND PELVIS FINDINGS  Hepatobiliary: No cystic or solid hepatic lesions. No intra or extrahepatic biliary ductal dilatation. Multiple small calcified gallstones lie dependently in the gallbladder. No findings to suggest acute cholecystitis noted at this time.  Pancreas: No pancreatic mass. No pancreatic ductal dilatation. No pancreatic or peripancreatic fluid or inflammatory changes.  Spleen: Unremarkable.  Adrenals/Urinary Tract: Exophytic 3.4 cm low-attenuation nonenhancing lesion in the lower pole of the left kidney is compatible with a simple cyst. Right kidney and bilateral adrenal glands are normal in appearance. There is no  hydroureteronephrosis. Urinary bladder is normal in appearance.  Stomach/Bowel: Normal appearance of the stomach. No pathologic dilatation of small bowel or colon. Status post appendectomy.  Vascular/Lymphatic: Aortic atherosclerosis, without evidence of aneurysm or dissection in the abdominal or pelvic vasculature. Vascular findings and measurements pertinent to potential TAVR procedure, as detailed below. Celiac axis, superior mesenteric artery and inferior mesenteric artery are all patent without definite hemodynamically significant stenosis. Single right and 2 left renal arteries are patent. No lymphadenopathy noted in the abdomen or pelvis.  Reproductive: Prostate gland and seminal vesicles are unremarkable in appearance.  Other: No significant volume of ascites.  No pneumoperitoneum.  Musculoskeletal: There are no aggressive appearing lytic or blastic lesions noted in the visualized portions of the skeleton.  VASCULAR MEASUREMENTS PERTINENT TO TAVR:  AORTA:  Minimal Aortic Diameter -  15 x 14 mm  Severity of Aortic Calcification -  severe  RIGHT PELVIS:  Right Common Iliac Artery -  Minimal Diameter - 9.9 x 5.3 mm  Tortuosity - moderate  Calcification -  severe  Right External Iliac Artery -  Minimal Diameter - 8.8 x 8.3 mm  Tortuosity - moderate  Calcification - mild  Right Common Femoral Artery -  Minimal Diameter - 9.5 x 7.0 mm  Tortuosity - mild  Calcification - moderate  LEFT PELVIS:  Left Common Iliac Artery -  Minimal Diameter - 7.9 x 7.7 mm  Tortuosity - mild  Calcification - severe  Left External Iliac Artery -  Minimal Diameter - 7.7 x 8.8 mm  Tortuosity - severe  Calcification - mild  Left Common Femoral Artery -  Minimal Diameter - 8.5 x 7.8 mm  Tortuosity - mild  Calcification - mild  Review of the MIP images confirms the above findings.  IMPRESSION: 1. Vascular findings and  measurements pertinent to potential TAVR procedure, as detailed above. This patient does appear to have suitable pelvic arterial access bilaterally. However, given the extensive calcification and asymmetric lumen of the right common iliac artery, vascular access may be more preferable on the left side (despite the greater tortuosity of the left external iliac artery). 2. Severe thickening calcification of the aortic valve, compatible with the reported clinical history of severe aortic stenosis. 3. Aortic atherosclerosis, in addition to left main and 3 vessel coronary artery disease. Please note that although the presence of coronary artery calcium documents the presence of coronary artery disease, the severity of this disease and any potential stenosis cannot be assessed on this non-gated CT examination. Assessment for potential risk factor modification, dietary therapy or pharmacologic therapy may be warranted, if clinically indicated. 4. Cardiomegaly with left ventricular dilatation. 5. Multiple tiny pulmonary nodules scattered throughout the lung bases measuring 5 mm or less in size. These are nonspecific, but statistically likely benign. No follow-up needed if patient is low-risk (and has no known or suspected primary neoplasm). Non-contrast chest CT can be considered in 12 months if patient is high-risk. This recommendation follows the consensus statement: Guidelines for Management of Incidental Pulmonary Nodules Detected on CT Images: From the Fleischner Society 2017; Radiology 2017; 284:228-243. 6. Cholelithiasis without evidence of acute cholecystitis at this time. 7. Additional incidental findings, as above.   Electronically Signed   By: Vinnie Langton M.D.   On: 02/16/2017 12:32    I mpression:  This gentleman has stage D severe, symptomatic aortic stenosis with low flow, low gradient and normal LVEF. He has NYHA class II symptoms of progressive exertional  shortness of breath and fatigue consistent with chronic diastolic heart failure. I have personally reviewed his echo, cath and CTA studies. Echo shows a trileaflet aortic valve with severe thickening, calcification and restricted leaflet mobility with a gradient of 58 mm Hg with peak stress. His cath shows moderate 3-vessel CAD with a 90% left PDA stenosis but he has not had any angina and I don't think this requires revascularization at this time. I think AVR is indicated in this symptomatic patient who is being limited by symptoms with severe AS. His risk for open surgical AVR and CABG would be moderately elevated due to his age, comorbid medical conditions and reduced mobility. I think TAVR is the best treatment for him. His cardiac CT shows anatomy favorable for TAVR using a 29 mm Sapien 3 valve and his pelvic arterial anatomy is suitable for transfemoral insertion.  The patient and his wife were counseled at length regarding treatment alternatives for management of severe symptomatic aortic stenosis. The risks and benefits of surgical intervention has been discussed in detail. Long-term prognosis with medical  therapy was discussed. Alternative approaches such as conventional surgical aortic valve replacement, transcatheter aortic valve replacement, and palliative medical therapy were compared and contrasted at length. This discussion was placed in the context of the patient's own specific clinical presentation and past medical history. All of their questions been addressed. The patient is eager to proceed with surgical management as soon as possible.  Following the decision to proceed with transcatheter aortic valve replacement, a discussion was held regarding what types of management strategies would be attempted intraoperatively in the event of life-threatening complications, including whether or not the patient would be considered a candidate for the use of cardiopulmonary bypass and/or conversion to open  sternotomy for attempted surgical intervention.   The patient has been advised of a variety of complications that might develop including but not limited to risks of death, stroke, paravalvular leak, aortic dissection or other major vascular complications, aortic annulus rupture, device embolization, cardiac rupture or perforation, mitral regurgitation, acute myocardial infarction, arrhythmia, heart block or bradycardia requiring permanent pacemaker placement, congestive heart failure, respiratory failure, renal failure, pneumonia, infection, other late complications related to structural valve deterioration or migration, or other complications that might ultimately cause a temporary or permanent loss of functional independence or other long term morbidity. The patient provides full informed consent for the procedure as described and all questions were answered.    Plan:  He is scheduled for transfemoral TAVR on 03/15/2017 by Dr. Roxy Manns and Dr. Angelena Form. He will discontinue his metformin 48 hrs prior to surgery.   I spent 45 minutes performing this consultation and > 50% of this time was spent face to face counseling and coordinating the care of this patient's severe aortic stenosis.    Gaye Pollack, MD 03/10/2017

## 2017-03-10 NOTE — Progress Notes (Signed)
137/73 

## 2017-03-11 ENCOUNTER — Encounter (HOSPITAL_COMMUNITY): Payer: Self-pay

## 2017-03-11 ENCOUNTER — Encounter (HOSPITAL_COMMUNITY)
Admission: RE | Admit: 2017-03-11 | Discharge: 2017-03-11 | Disposition: A | Discharge status: Home / Self Care | Payer: Medicare Other | Source: Ambulatory Visit | Attending: Cardiovascular Disease | Admitting: Cardiovascular Disease

## 2017-03-11 ENCOUNTER — Ambulatory Visit (HOSPITAL_COMMUNITY)
Admission: RE | Admit: 2017-03-11 | Discharge: 2017-03-11 | Disposition: A | Discharge status: Home / Self Care | Payer: Medicare Other | Source: Ambulatory Visit | Attending: Cardiovascular Disease | Admitting: Cardiovascular Disease

## 2017-03-11 DIAGNOSIS — I4510 Unspecified right bundle-branch block: Secondary | ICD-10-CM | POA: Insufficient documentation

## 2017-03-11 DIAGNOSIS — I440 Atrioventricular block, first degree: Secondary | ICD-10-CM | POA: Insufficient documentation

## 2017-03-11 DIAGNOSIS — Z01812 Encounter for preprocedural laboratory examination: Secondary | ICD-10-CM | POA: Insufficient documentation

## 2017-03-11 DIAGNOSIS — Z01818 Encounter for other preprocedural examination: Secondary | ICD-10-CM | POA: Insufficient documentation

## 2017-03-11 DIAGNOSIS — I350 Nonrheumatic aortic (valve) stenosis: Secondary | ICD-10-CM | POA: Insufficient documentation

## 2017-03-11 DIAGNOSIS — Z0183 Encounter for blood typing: Secondary | ICD-10-CM | POA: Diagnosis not present

## 2017-03-11 HISTORY — DX: Dyspnea, unspecified: R06.00

## 2017-03-11 HISTORY — DX: Spotted fever due to Rickettsia rickettsii: A77.0

## 2017-03-11 HISTORY — DX: Unspecified osteoarthritis, unspecified site: M19.90

## 2017-03-11 LAB — CBC
HCT: 36.8 % — ABNORMAL LOW (ref 39.0–52.0)
HEMOGLOBIN: 11.9 g/dL — AB (ref 13.0–17.0)
MCH: 30.1 pg (ref 26.0–34.0)
MCHC: 32.3 g/dL (ref 30.0–36.0)
MCV: 92.9 fL (ref 78.0–100.0)
PLATELETS: 173 10*3/uL (ref 150–400)
RBC: 3.96 MIL/uL — AB (ref 4.22–5.81)
RDW: 14.5 % (ref 11.5–15.5)
WBC: 7.5 10*3/uL (ref 4.0–10.5)

## 2017-03-11 LAB — COMPREHENSIVE METABOLIC PANEL
ALT: 17 U/L (ref 17–63)
AST: 21 U/L (ref 15–41)
Albumin: 4.2 g/dL (ref 3.5–5.0)
Alkaline Phosphatase: 52 U/L (ref 38–126)
Anion gap: 10 (ref 5–15)
BUN: 32 mg/dL — AB (ref 6–20)
CHLORIDE: 106 mmol/L (ref 101–111)
CO2: 20 mmol/L — AB (ref 22–32)
CREATININE: 1.15 mg/dL (ref 0.61–1.24)
Calcium: 9.7 mg/dL (ref 8.9–10.3)
GFR calc Af Amer: >60 mL/min (ref >60)
GFR calc non Af Amer: 58 mL/min — ABNORMAL LOW (ref >60)
Glucose, Bld: 109 mg/dL — ABNORMAL HIGH (ref 65–99)
Potassium: 4.7 mmol/L (ref 3.5–5.1)
SODIUM: 136 mmol/L (ref 135–145)
Total Bilirubin: 0.8 mg/dL (ref 0.3–1.2)
Total Protein: 7.2 g/dL (ref 6.5–8.1)

## 2017-03-11 LAB — GLUCOSE, CAPILLARY: GLUCOSE-CAPILLARY: 98 mg/dL (ref 65–99)

## 2017-03-11 LAB — PROTIME-INR
INR: 0.92
Prothrombin Time: 12.3 seconds (ref 11.4–15.2)

## 2017-03-11 LAB — URINALYSIS, ROUTINE W REFLEX MICROSCOPIC
Bilirubin Urine: NEGATIVE
GLUCOSE, UA: NEGATIVE mg/dL
Hgb urine dipstick: NEGATIVE
Ketones, ur: NEGATIVE mg/dL
LEUKOCYTES UA: NEGATIVE
Nitrite: NEGATIVE
PH: 5 (ref 5.0–8.0)
PROTEIN: NEGATIVE mg/dL
SPECIFIC GRAVITY, URINE: 1.005 (ref 1.005–1.030)

## 2017-03-11 LAB — TYPE AND SCREEN
ABO/RH(D): A POS
Antibody Screen: NEGATIVE

## 2017-03-11 LAB — BLOOD GAS, ARTERIAL
Acid-base deficit: 1.4 mmol/L (ref 0.0–2.0)
Bicarbonate: 22.2 mmol/L (ref 20.0–28.0)
DRAWN BY: 421801
O2 SAT: 95.5 %
PATIENT TEMPERATURE: 98.6
PCO2 ART: 33.3 mmHg (ref 32.0–48.0)
pH, Arterial: 7.44 (ref 7.350–7.450)
pO2, Arterial: 77.6 mmHg — ABNORMAL LOW (ref 83.0–108.0)

## 2017-03-11 LAB — SURGICAL PCR SCREEN
MRSA, PCR: NEGATIVE
Staphylococcus aureus: NEGATIVE

## 2017-03-11 LAB — ABO/RH: ABO/RH(D): A POS

## 2017-03-11 LAB — APTT: aPTT: 27 seconds (ref 24–36)

## 2017-03-11 NOTE — Pre-Procedure Instructions (Signed)
Todd Romero  03/11/2017      [facility] - [address], [address] [place] [address] Phone: [phone] Fax: [phone]  [id number] - [place], [address] [place] [address] Phone: [phone] Fax: [phone]  [facility] [place], [place] [address] [place] [address] Phone: [phone] Fax: [phone]    Your procedure is scheduled on 03/15/17.  Report to [facility] Admitting at 8 A.M.  Call this number if you have problems the morning of surgery:  [phone]   Remember:  Do not eat food or drink liquids after midnight.  Take these medicines the morning of surgery with A SIP OF WATER --norvasc,flomax   Do not wear jewelry, make-up or nail polish.  Do not wear lotions, powders, or perfumes, or deoderant.  Do not shave 48 hours prior to surgery.  Men may shave face and neck.  Do not bring valuables to the hospital.  [facility] is not responsible for any belongings or valuables.  Contacts, dentures or bridgework may not be worn into surgery.  Leave your suitcase in the car.  After surgery it may be brought to your room.  For patients admitted to the hospital, discharge time will be determined by your treatment team.  Patients discharged the day of surgery will not be allowed to drive home.   Name and phone number of your driver:    Special instructions:  Stop NSAIDS 7 days prior to surgery  Please read over the following fact sheets that you were given. MRSA Information    How to Manage Your Diabetes Before and After Surgery  Why is it important to control my blood sugar before and after surgery? . Improving blood sugar levels before and after surgery helps healing and can limit problems. . A way of improving blood sugar control is eating a healthy diet by: o   Eating less sugar and carbohydrates o  Increasing activity/exercise o  Talking with your doctor about reaching your blood sugar goals . High blood sugars (greater than 180 mg/dL) can raise your risk of infections and slow your recovery, so you will need to focus on controlling your diabetes during the weeks before surgery. . Make sure that the doctor who takes care of your diabetes knows about your planned surgery including the date and location.  How do I manage my blood sugar before surgery? . Check your blood sugar at least 4 times a day, starting 2 days before surgery, to make sure that the level is not too high or low. o Check your blood sugar the morning of your surgery when you wake up and every 2 hours until you get to the Short Stay unit. . If your blood sugar is less than 70 mg/dL, you will need to treat for low blood sugar: o Do not take insulin. o Treat a low blood sugar (less than 70 mg/dL) with  cup of clear juice (cranberry or apple), 4 glucose tablets, OR glucose gel. o Recheck blood sugar in 15 minutes after treatment (to make sure it is greater than 70 mg/dL). If your blood sugar is not greater than 70 mg/dL on recheck, call [phone] for further instructions. . Report your blood sugar to the short stay nurse when you get to Short Stay.  [name]  If you are admitted to the hospital after surgery: o Your blood sugar will be checked by the staff and you will probably be given insulin after surgery (instead of oral diabetes medicines) to make sure you have good blood sugar levels. o The goal for blood sugar control after surgery is 80-180 mg/dL.              WHAT DO I DO ABOUT MY DIABETES MEDICATION?   [name] Do not take oral diabetes medicines (pills) the morning of surgery.  . THE NIGHT BEFORE SURGERY, take ___________ units of ___________insulin.       [name] HE MORNING OF SURGERY, take _____________ units of __________insulin.  . The day of surgery, do not take other  diabetes injectables, including Byetta (exenatide), Bydureon (exenatide ER), Victoza (liraglutide), or Trulicity (dulaglutide).  . If your CBG is greater than 220 mg/dL, you may take  of your sliding scale (correction) dose of insulin.  Other Instructions:          Patient Signature:  Date:   Nurse Signature:  Date:   Reviewed and Endorsed by [facility] Patient Education Committee, August 2015

## 2017-03-12 LAB — HEMOGLOBIN A1C
HEMOGLOBIN A1C: 7.4 % — AB (ref 4.8–5.6)
Mean Plasma Glucose: 166 mg/dL

## 2017-03-14 ENCOUNTER — Telehealth: Payer: Self-pay | Admitting: Endocrinology

## 2017-03-14 MED ORDER — VANCOMYCIN HCL 10 G IV SOLR
1500.0000 mg | INTRAVENOUS | Status: AC
  Administered 2017-03-15: 1500 mg via INTRAVENOUS
  Filled 2017-03-14: qty 1500

## 2017-03-14 MED ORDER — SODIUM CHLORIDE 0.9 % IV SOLN
INTRAVENOUS | Status: DC
  Filled 2017-03-14: qty 30

## 2017-03-14 MED ORDER — DEXTROSE 5 % IV SOLN
1.5000 g | INTRAVENOUS | Status: AC
  Administered 2017-03-15: 1.5 g via INTRAVENOUS
  Filled 2017-03-14: qty 1.5

## 2017-03-14 MED ORDER — DEXMEDETOMIDINE HCL IN NACL 400 MCG/100ML IV SOLN
0.1000 ug/kg/h | INTRAVENOUS | Status: DC
  Filled 2017-03-14: qty 100

## 2017-03-14 MED ORDER — PHENYLEPHRINE HCL 10 MG/ML IJ SOLN
30.0000 ug/min | INTRAMUSCULAR | Status: DC
  Filled 2017-03-14: qty 2

## 2017-03-14 MED ORDER — CHLORHEXIDINE GLUCONATE 0.12 % MT SOLN
15.0000 mL | Freq: Once | OROMUCOSAL | Status: AC
  Administered 2017-03-15: 15 mL via OROMUCOSAL
  Filled 2017-03-14: qty 15

## 2017-03-14 MED ORDER — SODIUM CHLORIDE 0.9 % IV SOLN: INTRAVENOUS | Status: DC

## 2017-03-14 MED ORDER — NITROGLYCERIN IN D5W 200-5 MCG/ML-% IV SOLN
2.0000 ug/min | INTRAVENOUS | Status: DC
  Filled 2017-03-14: qty 250

## 2017-03-14 MED ORDER — NOREPINEPHRINE BITARTRATE 1 MG/ML IV SOLN
0.0000 ug/min | INTRAVENOUS | Status: DC
  Filled 2017-03-14: qty 4

## 2017-03-14 MED ORDER — SODIUM CHLORIDE 0.9 % IV SOLN
INTRAVENOUS | Status: DC
  Filled 2017-03-14: qty 1

## 2017-03-14 MED ORDER — EPINEPHRINE PF 1 MG/ML IJ SOLN
0.0000 ug/min | INTRAVENOUS | Status: DC
  Filled 2017-03-14: qty 4

## 2017-03-14 MED ORDER — MAGNESIUM SULFATE 50 % IJ SOLN
40.0000 meq | INTRAMUSCULAR | Status: DC
  Filled 2017-03-14: qty 10

## 2017-03-14 MED ORDER — POTASSIUM CHLORIDE 2 MEQ/ML IV SOLN
80.0000 meq | INTRAVENOUS | Status: DC
  Filled 2017-03-14: qty 40

## 2017-03-14 MED ORDER — DOPAMINE-DEXTROSE 3.2-5 MG/ML-% IV SOLN
0.0000 ug/kg/min | INTRAVENOUS | Status: DC
  Filled 2017-03-14: qty 250

## 2017-03-14 NOTE — Telephone Encounter (Signed)
He can still take his bedtime insulin tonight and no diabetes medications for Humalog tomorrow.  We hope his procedure goes very well

## 2017-03-14 NOTE — Telephone Encounter (Signed)
Please advise 

## 2017-03-14 NOTE — Telephone Encounter (Signed)
Called patient and left a voice message of the instructions per Dr. Dwyane Dee

## 2017-03-14 NOTE — Telephone Encounter (Signed)
Patient called to advise that he is having a TAVR done tomorrow at 1 pm @ [facility]. He is calling to advise you of this.

## 2017-03-15 ENCOUNTER — Encounter (HOSPITAL_COMMUNITY): Payer: Self-pay | Admitting: *Deleted

## 2017-03-15 ENCOUNTER — Inpatient Hospital Stay (HOSPITAL_COMMUNITY)
Admission: RE | Admit: 2017-03-15 | Discharge: 2017-03-17 | DRG: 267 | Disposition: A | Discharge status: Home / Self Care | Payer: Medicare Other | Source: Ambulatory Visit | Attending: Cardiovascular Disease | Admitting: Cardiovascular Disease

## 2017-03-15 ENCOUNTER — Inpatient Hospital Stay (HOSPITAL_COMMUNITY): Payer: Medicare Other

## 2017-03-15 ENCOUNTER — Ambulatory Visit (HOSPITAL_COMMUNITY)
Admission: RE | Admit: 2017-03-15 | Discharge: 2017-03-15 | Disposition: A | Discharge status: Home / Self Care | Payer: Medicare Other | Source: Ambulatory Visit | Attending: Cardiovascular Disease | Admitting: Cardiovascular Disease

## 2017-03-15 ENCOUNTER — Other Ambulatory Visit: Payer: Self-pay | Admitting: *Deleted

## 2017-03-15 ENCOUNTER — Inpatient Hospital Stay (HOSPITAL_COMMUNITY): Payer: Medicare Other | Admitting: Emergency Medicine

## 2017-03-15 ENCOUNTER — Encounter (HOSPITAL_COMMUNITY)
Admission: RE | Disposition: A | Discharge status: Home / Self Care | Payer: Self-pay | Source: Ambulatory Visit | Attending: Cardiovascular Disease

## 2017-03-15 DIAGNOSIS — E7800 Pure hypercholesterolemia, unspecified: Secondary | ICD-10-CM | POA: Diagnosis present

## 2017-03-15 DIAGNOSIS — Z952 Presence of prosthetic heart valve: Secondary | ICD-10-CM | POA: Diagnosis not present

## 2017-03-15 DIAGNOSIS — Z7982 Long term (current) use of aspirin: Secondary | ICD-10-CM | POA: Diagnosis not present

## 2017-03-15 DIAGNOSIS — I350 Nonrheumatic aortic (valve) stenosis: Secondary | ICD-10-CM | POA: Diagnosis present

## 2017-03-15 DIAGNOSIS — Z794 Long term (current) use of insulin: Secondary | ICD-10-CM

## 2017-03-15 DIAGNOSIS — I110 Hypertensive heart disease with heart failure: Secondary | ICD-10-CM | POA: Diagnosis present

## 2017-03-15 DIAGNOSIS — M1711 Unilateral primary osteoarthritis, right knee: Secondary | ICD-10-CM | POA: Diagnosis present

## 2017-03-15 DIAGNOSIS — Z954 Presence of other heart-valve replacement: Secondary | ICD-10-CM | POA: Diagnosis not present

## 2017-03-15 DIAGNOSIS — Z825 Family history of asthma and other chronic lower respiratory diseases: Secondary | ICD-10-CM

## 2017-03-15 DIAGNOSIS — R001 Bradycardia, unspecified: Secondary | ICD-10-CM | POA: Diagnosis not present

## 2017-03-15 DIAGNOSIS — D62 Acute posthemorrhagic anemia: Secondary | ICD-10-CM | POA: Diagnosis not present

## 2017-03-15 DIAGNOSIS — I361 Nonrheumatic tricuspid (valve) insufficiency: Secondary | ICD-10-CM | POA: Diagnosis not present

## 2017-03-15 DIAGNOSIS — Z6834 Body mass index (BMI) 34.0-34.9, adult: Secondary | ICD-10-CM | POA: Diagnosis not present

## 2017-03-15 DIAGNOSIS — Z006 Encounter for examination for normal comparison and control in clinical research program: Secondary | ICD-10-CM

## 2017-03-15 DIAGNOSIS — I5033 Acute on chronic diastolic (congestive) heart failure: Secondary | ICD-10-CM | POA: Diagnosis present

## 2017-03-15 DIAGNOSIS — I4510 Unspecified right bundle-branch block: Secondary | ICD-10-CM | POA: Diagnosis present

## 2017-03-15 DIAGNOSIS — Z79899 Other long term (current) drug therapy: Secondary | ICD-10-CM

## 2017-03-15 DIAGNOSIS — I2510 Atherosclerotic heart disease of native coronary artery without angina pectoris: Secondary | ICD-10-CM | POA: Diagnosis present

## 2017-03-15 DIAGNOSIS — R0600 Dyspnea, unspecified: Secondary | ICD-10-CM | POA: Diagnosis present

## 2017-03-15 DIAGNOSIS — I5032 Chronic diastolic (congestive) heart failure: Secondary | ICD-10-CM | POA: Diagnosis present

## 2017-03-15 DIAGNOSIS — I959 Hypotension, unspecified: Secondary | ICD-10-CM | POA: Diagnosis not present

## 2017-03-15 DIAGNOSIS — J9811 Atelectasis: Secondary | ICD-10-CM

## 2017-03-15 DIAGNOSIS — E669 Obesity, unspecified: Secondary | ICD-10-CM | POA: Diagnosis present

## 2017-03-15 DIAGNOSIS — I700 Atherosclerosis of aorta: Secondary | ICD-10-CM | POA: Diagnosis present

## 2017-03-15 DIAGNOSIS — R0609 Other forms of dyspnea: Secondary | ICD-10-CM

## 2017-03-15 DIAGNOSIS — E119 Type 2 diabetes mellitus without complications: Secondary | ICD-10-CM | POA: Diagnosis present

## 2017-03-15 DIAGNOSIS — I10 Essential (primary) hypertension: Secondary | ICD-10-CM | POA: Diagnosis not present

## 2017-03-15 DIAGNOSIS — G4733 Obstructive sleep apnea (adult) (pediatric): Secondary | ICD-10-CM | POA: Diagnosis present

## 2017-03-15 DIAGNOSIS — Z8249 Family history of ischemic heart disease and other diseases of the circulatory system: Secondary | ICD-10-CM

## 2017-03-15 HISTORY — DX: Presence of prosthetic heart valve: Z95.2

## 2017-03-15 HISTORY — PX: TRANSCATHETER AORTIC VALVE REPLACEMENT, TRANSFEMORAL: SHX6400

## 2017-03-15 HISTORY — PX: TEE WITHOUT CARDIOVERSION: SHX5443

## 2017-03-15 LAB — POCT I-STAT, CHEM 8
BUN: 27 mg/dL — AB (ref 6–20)
BUN: 27 mg/dL — AB (ref 6–20)
CALCIUM ION: 1.23 mmol/L (ref 1.15–1.40)
CALCIUM ION: 1.27 mmol/L (ref 1.15–1.40)
CHLORIDE: 106 mmol/L (ref 101–111)
CREATININE: 1 mg/dL (ref 0.61–1.24)
Chloride: 105 mmol/L (ref 101–111)
Creatinine, Ser: 0.9 mg/dL (ref 0.61–1.24)
GLUCOSE: 126 mg/dL — AB (ref 65–99)
Glucose, Bld: 174 mg/dL — ABNORMAL HIGH (ref 65–99)
HCT: 32 % — ABNORMAL LOW (ref 39.0–52.0)
HCT: 31 % — ABNORMAL LOW (ref 39.0–52.0)
Hemoglobin: 10.9 g/dL — ABNORMAL LOW (ref 13.0–17.0)
Hemoglobin: 10.5 g/dL — ABNORMAL LOW (ref 13.0–17.0)
Potassium: 4.5 mmol/L (ref 3.5–5.1)
Potassium: 4.6 mmol/L (ref 3.5–5.1)
Sodium: 140 mmol/L (ref 135–145)
Sodium: 140 mmol/L (ref 135–145)
TCO2: 23 mmol/L (ref 0–100)
TCO2: 25 mmol/L (ref 0–100)

## 2017-03-15 LAB — CBC
HCT: 31.2 % — ABNORMAL LOW (ref 39.0–52.0)
Hemoglobin: 10.2 g/dL — ABNORMAL LOW (ref 13.0–17.0)
MCH: 29.9 pg (ref 26.0–34.0)
MCHC: 32.7 g/dL (ref 30.0–36.0)
MCV: 91.5 fL (ref 78.0–100.0)
Platelets: 142 10*3/uL — ABNORMAL LOW (ref 150–400)
RBC: 3.41 MIL/uL — ABNORMAL LOW (ref 4.22–5.81)
RDW: 14 % (ref 11.5–15.5)
WBC: 8.8 10*3/uL (ref 4.0–10.5)

## 2017-03-15 LAB — GLUCOSE, CAPILLARY
GLUCOSE-CAPILLARY: 202 mg/dL — AB (ref 65–99)
Glucose-Capillary: 130 mg/dL — ABNORMAL HIGH (ref 65–99)
Glucose-Capillary: 190 mg/dL — ABNORMAL HIGH (ref 65–99)
Glucose-Capillary: 199 mg/dL — ABNORMAL HIGH (ref 65–99)

## 2017-03-15 LAB — PROTIME-INR
INR: 1.11
Prothrombin Time: 14.3 seconds (ref 11.4–15.2)

## 2017-03-15 LAB — APTT: APTT: 31 s (ref 24–36)

## 2017-03-15 SURGERY — IMPLANTATION, AORTIC VALVE, TRANSCATHETER, FEMORAL APPROACH
Anesthesia: Monitor Anesthesia Care

## 2017-03-15 MED ORDER — CHLORHEXIDINE GLUCONATE 4 % EX LIQD
60.0000 mL | Freq: Once | CUTANEOUS | Status: DC
  Administered 2017-03-15: 4 via TOPICAL

## 2017-03-15 MED ORDER — GLYCOPYRROLATE 0.2 MG/ML IJ SOLN
INTRAMUSCULAR | Status: DC | PRN
  Administered 2017-03-15: 0.2 mg via INTRAVENOUS

## 2017-03-15 MED ORDER — CHLORHEXIDINE GLUCONATE 0.12 % MT SOLN: 15.0000 mL | OROMUCOSAL | Status: DC

## 2017-03-15 MED ORDER — IODIXANOL 320 MG/ML IV SOLN
INTRAVENOUS | Status: DC | PRN
  Administered 2017-03-15: 75.8 mL via INTRA_ARTERIAL

## 2017-03-15 MED ORDER — ASPIRIN 81 MG PO CHEW: 324.0000 mg | CHEWABLE_TABLET | Freq: Every day | ORAL | Status: DC

## 2017-03-15 MED ORDER — ORAL CARE MOUTH RINSE
15.0000 mL | Freq: Two times a day (BID) | OROMUCOSAL | Status: DC
  Administered 2017-03-15: 15 mL via OROMUCOSAL

## 2017-03-15 MED ORDER — PANTOPRAZOLE SODIUM 40 MG PO TBEC
40.0000 mg | DELAYED_RELEASE_TABLET | Freq: Every day | ORAL | Status: DC
  Administered 2017-03-17: 40 mg via ORAL
  Filled 2017-03-15 (×2): qty 1

## 2017-03-15 MED ORDER — NITROGLYCERIN IN D5W 200-5 MCG/ML-% IV SOLN: 0.0000 ug/min | INTRAVENOUS | Status: DC

## 2017-03-15 MED ORDER — DEXMEDETOMIDINE HCL 200 MCG/2ML IV SOLN
INTRAVENOUS | Status: DC | PRN
  Administered 2017-03-15: 157.5 ug via INTRAVENOUS

## 2017-03-15 MED ORDER — PROPOFOL 10 MG/ML IV BOLUS
INTRAVENOUS | Status: DC | PRN
  Administered 2017-03-15: 20 mg via INTRAVENOUS
  Administered 2017-03-15: 30 mg via INTRAVENOUS
  Administered 2017-03-15: 10 mg via INTRAVENOUS

## 2017-03-15 MED ORDER — SODIUM CHLORIDE 0.9 % IV SOLN
0.0000 ug/min | INTRAVENOUS | Status: DC
  Filled 2017-03-15: qty 2

## 2017-03-15 MED ORDER — SUFENTANIL CITRATE 50 MCG/ML IV SOLN
INTRAVENOUS | Status: AC
  Filled 2017-03-15: qty 1

## 2017-03-15 MED ORDER — HEPARIN SODIUM (PORCINE) 1000 UNIT/ML IJ SOLN
INTRAMUSCULAR | Status: DC | PRN
  Administered 2017-03-15: 14000 [IU] via INTRAVENOUS

## 2017-03-15 MED ORDER — LACTATED RINGERS IV SOLN: 500.0000 mL | Freq: Once | INTRAVENOUS | Status: DC | PRN

## 2017-03-15 MED ORDER — MIDAZOLAM HCL 2 MG/2ML IJ SOLN
INTRAMUSCULAR | Status: AC
  Filled 2017-03-15: qty 2

## 2017-03-15 MED ORDER — EPINEPHRINE PF 1 MG/10ML IJ SOSY
PREFILLED_SYRINGE | INTRAMUSCULAR | Status: AC
  Filled 2017-03-15: qty 20

## 2017-03-15 MED ORDER — DEXMEDETOMIDINE HCL IN NACL 400 MCG/100ML IV SOLN
INTRAVENOUS | Status: DC | PRN
  Administered 2017-03-15: .5 ug/kg/h via INTRAVENOUS

## 2017-03-15 MED ORDER — ONDANSETRON HCL 4 MG/2ML IJ SOLN
INTRAMUSCULAR | Status: DC | PRN
  Administered 2017-03-15: 4 mg via INTRAVENOUS

## 2017-03-15 MED ORDER — POTASSIUM CHLORIDE 10 MEQ/50ML IV SOLN: 10.0000 meq | INTRAVENOUS | Status: AC

## 2017-03-15 MED ORDER — PRAVASTATIN SODIUM 40 MG PO TABS
80.0000 mg | ORAL_TABLET | Freq: Every evening | ORAL | Status: DC
  Administered 2017-03-16: 80 mg via ORAL
  Filled 2017-03-15: qty 2

## 2017-03-15 MED ORDER — ONDANSETRON HCL 4 MG/2ML IJ SOLN: 4.0000 mg | Freq: Four times a day (QID) | INTRAMUSCULAR | Status: DC | PRN

## 2017-03-15 MED ORDER — ASPIRIN EC 81 MG PO TBEC
81.0000 mg | DELAYED_RELEASE_TABLET | Freq: Every day | ORAL | Status: DC
  Administered 2017-03-17: 81 mg via ORAL
  Filled 2017-03-15 (×2): qty 1

## 2017-03-15 MED ORDER — DEXTROSE 5 % IV SOLN
INTRAVENOUS | Status: DC | PRN
  Administered 2017-03-15: 1 ug/min via INTRAVENOUS
  Administered 2017-03-15: 2 ug/min via INTRAVENOUS

## 2017-03-15 MED ORDER — ONDANSETRON HCL 4 MG/2ML IJ SOLN
INTRAMUSCULAR | Status: AC
  Filled 2017-03-15: qty 2

## 2017-03-15 MED ORDER — CHLORHEXIDINE GLUCONATE 4 % EX LIQD: 60.0000 mL | Freq: Once | CUTANEOUS | Status: DC

## 2017-03-15 MED ORDER — LACTATED RINGERS IV SOLN
INTRAVENOUS | Status: DC
  Administered 2017-03-15: 12:00:00 via INTRAVENOUS

## 2017-03-15 MED ORDER — LACTATED RINGERS IV SOLN
INTRAVENOUS | Status: DC | PRN
  Administered 2017-03-15: 13:00:00 via INTRAVENOUS

## 2017-03-15 MED ORDER — ASPIRIN EC 81 MG PO TBEC: 81.0000 mg | DELAYED_RELEASE_TABLET | Freq: Every day | ORAL | Status: DC

## 2017-03-15 MED ORDER — FENTANYL CITRATE (PF) 100 MCG/2ML IJ SOLN
INTRAMUSCULAR | Status: AC
  Filled 2017-03-15: qty 2

## 2017-03-15 MED ORDER — CLOPIDOGREL BISULFATE 75 MG PO TABS
75.0000 mg | ORAL_TABLET | Freq: Every day | ORAL | Status: DC
  Administered 2017-03-16 – 2017-03-17 (×2): 75 mg via ORAL
  Filled 2017-03-15 (×2): qty 1

## 2017-03-15 MED ORDER — FAMOTIDINE IN NACL 20-0.9 MG/50ML-% IV SOLN
20.0000 mg | Freq: Two times a day (BID) | INTRAVENOUS | Status: AC
  Administered 2017-03-15 – 2017-03-16 (×2): 20 mg via INTRAVENOUS
  Filled 2017-03-15 (×2): qty 50

## 2017-03-15 MED ORDER — 0.9 % SODIUM CHLORIDE (POUR BTL) OPTIME
TOPICAL | Status: DC | PRN
  Administered 2017-03-15: 5000 mL

## 2017-03-15 MED ORDER — LIDOCAINE HCL (PF) 1 % IJ SOLN
INTRAMUSCULAR | Status: AC
  Filled 2017-03-15: qty 30

## 2017-03-15 MED ORDER — CHLORHEXIDINE GLUCONATE 4 % EX LIQD: 30.0000 mL | CUTANEOUS | Status: DC

## 2017-03-15 MED ORDER — ASPIRIN EC 325 MG PO TBEC: 325.0000 mg | DELAYED_RELEASE_TABLET | Freq: Every day | ORAL | Status: DC

## 2017-03-15 MED ORDER — DEXTROSE 5 % IV SOLN
1.5000 g | Freq: Two times a day (BID) | INTRAVENOUS | Status: AC
  Administered 2017-03-15 – 2017-03-17 (×4): 1.5 g via INTRAVENOUS
  Filled 2017-03-15 (×4): qty 1.5

## 2017-03-15 MED ORDER — PHENYLEPHRINE 40 MCG/ML (10ML) SYRINGE FOR IV PUSH (FOR BLOOD PRESSURE SUPPORT)
PREFILLED_SYRINGE | INTRAVENOUS | Status: AC
  Filled 2017-03-15: qty 30

## 2017-03-15 MED ORDER — HEPARIN SODIUM (PORCINE) 5000 UNIT/ML IJ SOLN
INTRAMUSCULAR | Status: DC | PRN
  Administered 2017-03-15: 1500 mL

## 2017-03-15 MED ORDER — VANCOMYCIN HCL IN DEXTROSE 1-5 GM/200ML-% IV SOLN
1000.0000 mg | Freq: Once | INTRAVENOUS | Status: AC
  Administered 2017-03-15: 1000 mg via INTRAVENOUS
  Filled 2017-03-15: qty 200

## 2017-03-15 MED ORDER — MIDAZOLAM HCL 5 MG/5ML IJ SOLN
INTRAMUSCULAR | Status: DC | PRN
  Administered 2017-03-15: 1 mg via INTRAVENOUS

## 2017-03-15 MED ORDER — ALBUMIN HUMAN 5 % IV SOLN: 250.0000 mL | INTRAVENOUS | Status: DC | PRN

## 2017-03-15 MED ORDER — OXYCODONE HCL 5 MG PO TABS: 5.0000 mg | ORAL_TABLET | ORAL | Status: DC | PRN

## 2017-03-15 MED ORDER — TRAMADOL HCL 50 MG PO TABS
50.0000 mg | ORAL_TABLET | ORAL | Status: DC | PRN
  Administered 2017-03-16: 50 mg via ORAL
  Administered 2017-03-16: 100 mg via ORAL
  Filled 2017-03-15: qty 2
  Filled 2017-03-15: qty 1

## 2017-03-15 MED ORDER — MIDAZOLAM HCL 2 MG/2ML IJ SOLN: 2.0000 mg | INTRAMUSCULAR | Status: DC | PRN

## 2017-03-15 MED ORDER — METOPROLOL TARTRATE 5 MG/5ML IV SOLN: 2.5000 mg | INTRAVENOUS | Status: DC | PRN

## 2017-03-15 MED ORDER — SODIUM CHLORIDE 0.9 % IV SOLN
1.0000 mL/kg/h | INTRAVENOUS | Status: DC
  Administered 2017-03-16: 1 mL/kg/h via INTRAVENOUS

## 2017-03-15 MED ORDER — PROPOFOL 500 MG/50ML IV EMUL
INTRAVENOUS | Status: DC | PRN
  Administered 2017-03-15: 25 ug/kg/min via INTRAVENOUS

## 2017-03-15 MED ORDER — SODIUM CHLORIDE 0.9 % IV SOLN
1.0000 mL/kg/h | INTRAVENOUS | Status: DC
  Administered 2017-03-15: 1 mL/kg/h via INTRAVENOUS

## 2017-03-15 MED ORDER — SUFENTANIL CITRATE 50 MCG/ML IV SOLN
INTRAVENOUS | Status: DC | PRN
  Administered 2017-03-15 (×2): 5 ug via INTRAVENOUS

## 2017-03-15 MED ORDER — LIDOCAINE HCL 1 % IJ SOLN
INTRAMUSCULAR | Status: DC | PRN
  Administered 2017-03-15: 29 mL

## 2017-03-15 MED ORDER — FENTANYL CITRATE (PF) 100 MCG/2ML IJ SOLN
50.0000 ug | INTRAMUSCULAR | Status: DC | PRN
  Administered 2017-03-16: 50 ug via INTRAVENOUS
  Filled 2017-03-15: qty 2

## 2017-03-15 MED ORDER — PROTAMINE SULFATE 10 MG/ML IV SOLN
INTRAVENOUS | Status: DC | PRN
  Administered 2017-03-15: 140 mg via INTRAVENOUS

## 2017-03-15 MED ORDER — TAMSULOSIN HCL 0.4 MG PO CAPS
0.4000 mg | ORAL_CAPSULE | Freq: Every day | ORAL | Status: DC
  Administered 2017-03-16 – 2017-03-17 (×2): 0.4 mg via ORAL
  Filled 2017-03-15 (×2): qty 1

## 2017-03-15 MED FILL — Heparin Sodium (Porcine) Inj 1000 Unit/ML: INTRAMUSCULAR | Qty: 30 | Status: AC

## 2017-03-15 MED FILL — Magnesium Sulfate Inj 50%: INTRAMUSCULAR | Qty: 10 | Status: AC

## 2017-03-15 MED FILL — Potassium Chloride Inj 2 mEq/ML: INTRAVENOUS | Qty: 10 | Status: AC

## 2017-03-15 SURGICAL SUPPLY — 98 items
ADAPTER UNIV SWAN GANZ BIP (ADAPTER) ×1 IMPLANT
ADAPTER UNV SWAN GANZ BIP (ADAPTER) ×2
ADH SKN CLS APL DERMABOND .7 (GAUZE/BANDAGES/DRESSINGS) ×1
ADPR CATH UNV NS SG CATH (ADAPTER) ×1
BAG BANDED W/RUBBER/TAPE 36X54 (MISCELLANEOUS) ×3 IMPLANT
BAG DECANTER FOR FLEXI CONT (MISCELLANEOUS) IMPLANT
BAG EQP BAND 135X91 W/RBR TAPE (MISCELLANEOUS) ×1
BAG SNAP BAND KOVER 36X36 (MISCELLANEOUS) ×6 IMPLANT
BLADE CLIPPER SURG (BLADE) IMPLANT
BLADE OSCILLATING /SAGITTAL (BLADE) IMPLANT
BLADE STERNUM SYSTEM 6 (BLADE) ×3 IMPLANT
CABLE ADAPT CONN TEMP 6FT (ADAPTER) ×3 IMPLANT
CABLE PACING FASLOC BIEGE (MISCELLANEOUS) ×3 IMPLANT
CABLE PACING FASLOC BLUE (MISCELLANEOUS) ×3 IMPLANT
CANNULA FEM VENOUS REMOTE 22FR (CANNULA) IMPLANT
CANNULA OPTISITE PERFUSION 16F (CANNULA) IMPLANT
CANNULA OPTISITE PERFUSION 18F (CANNULA) IMPLANT
CATH DIAG EXPO 6F VENT PIG 145 (CATHETERS) ×6 IMPLANT
CATH EXPO 5FR AL1 (CATHETERS) ×3 IMPLANT
CATH S G BIP PACING (SET/KITS/TRAYS/PACK) ×6 IMPLANT
CONT SPEC 4OZ CLIKSEAL STRL BL (MISCELLANEOUS) ×18 IMPLANT
COVER BACK TABLE 24X17X13 BIG (DRAPES) ×3 IMPLANT
COVER BACK TABLE 60X90IN (DRAPES) ×3 IMPLANT
COVER BACK TABLE 80X110 HD (DRAPES) ×3 IMPLANT
COVER DOME SNAP 22 D (MISCELLANEOUS) ×3 IMPLANT
COVER MAYO STAND STRL (DRAPES) ×3 IMPLANT
CRADLE DONUT ADULT HEAD (MISCELLANEOUS) ×3 IMPLANT
DERMABOND ADVANCED (GAUZE/BANDAGES/DRESSINGS) ×2
DERMABOND ADVANCED .7 DNX12 (GAUZE/BANDAGES/DRESSINGS) ×1 IMPLANT
DEVICE CLOSURE PERCLS PRGLD 6F (VASCULAR PRODUCTS) IMPLANT
DRAPE INCISE IOBAN 66X45 STRL (DRAPES) IMPLANT
DRAPE SLUSH MACHINE 52X66 (DRAPES) ×3 IMPLANT
DRSG TEGADERM 4X4.75 (GAUZE/BANDAGES/DRESSINGS) ×5 IMPLANT
ELECT REM PT RETURN 9FT ADLT (ELECTROSURGICAL) ×6
ELECTRODE REM PT RTRN 9FT ADLT (ELECTROSURGICAL) ×2 IMPLANT
FELT TEFLON 6X6 (MISCELLANEOUS) ×3 IMPLANT
FEMORAL VENOUS CANN RAP (CANNULA) IMPLANT
GAUZE SPONGE 4X4 12PLY STRL (GAUZE/BANDAGES/DRESSINGS) ×3 IMPLANT
GAUZE SPONGE 4X4 12PLY STRL LF (GAUZE/BANDAGES/DRESSINGS) ×4 IMPLANT
GLOVE BIO SURGEON STRL SZ8 (GLOVE) ×6 IMPLANT
GLOVE EUDERMIC 7 POWDERFREE (GLOVE) ×6 IMPLANT
GLOVE ORTHO TXT STRL SZ7.5 (GLOVE) ×6 IMPLANT
GOWN STRL REUS W/ TWL LRG LVL3 (GOWN DISPOSABLE) ×3 IMPLANT
GOWN STRL REUS W/ TWL XL LVL3 (GOWN DISPOSABLE) ×6 IMPLANT
GOWN STRL REUS W/TWL LRG LVL3 (GOWN DISPOSABLE) ×9
GOWN STRL REUS W/TWL XL LVL3 (GOWN DISPOSABLE) ×18
GUIDEWIRE SAF TJ AMPL .035X180 (WIRE) ×3 IMPLANT
GUIDEWIRE SAFE TJ AMPLATZ EXST (WIRE) ×3 IMPLANT
GUIDEWIRE STRAIGHT .035 260CM (WIRE) ×3 IMPLANT
INSERT FOGARTY 61MM (MISCELLANEOUS) ×3 IMPLANT
INSERT FOGARTY SM (MISCELLANEOUS) ×6 IMPLANT
INSERT FOGARTY XLG (MISCELLANEOUS) IMPLANT
KIT BASIN OR (CUSTOM PROCEDURE TRAY) ×3 IMPLANT
KIT DILATOR VASC 18G NDL (KITS) IMPLANT
KIT HEART LEFT (KITS) ×3 IMPLANT
KIT ROOM TURNOVER OR (KITS) ×3 IMPLANT
KIT SUCTION CATH 14FR (SUCTIONS) ×6 IMPLANT
NDL PERC 18GX7CM (NEEDLE) ×1 IMPLANT
NEEDLE PERC 18GX7CM (NEEDLE) ×3 IMPLANT
NS IRRIG 1000ML POUR BTL (IV SOLUTION) ×9 IMPLANT
PACK AORTA (CUSTOM PROCEDURE TRAY) ×3 IMPLANT
PAD ARMBOARD 7.5X6 YLW CONV (MISCELLANEOUS) ×6 IMPLANT
PAD ELECT DEFIB RADIOL ZOLL (MISCELLANEOUS) ×3 IMPLANT
PERCLOSE PROGLIDE 6F (VASCULAR PRODUCTS)
SET MICROPUNCTURE 5F STIFF (MISCELLANEOUS) ×3 IMPLANT
SHEATH AVANTI 11CM 8FR (MISCELLANEOUS) ×3 IMPLANT
SHEATH PINNACLE 6F 10CM (SHEATH) ×6 IMPLANT
SLEEVE REPOSITIONING LENGTH 30 (MISCELLANEOUS) ×3 IMPLANT
SPONGE LAP 4X18 X RAY DECT (DISPOSABLE) ×3 IMPLANT
STOPCOCK MORSE 400PSI 3WAY (MISCELLANEOUS) ×18 IMPLANT
SUT ETHIBOND X763 2 0 SH 1 (SUTURE) ×3 IMPLANT
SUT GORETEX CV 4 TH 22 36 (SUTURE) ×3 IMPLANT
SUT GORETEX CV4 TH-18 (SUTURE) ×9 IMPLANT
SUT GORETEX TH-18 36 INCH (SUTURE) ×6 IMPLANT
SUT MNCRL AB 3-0 PS2 18 (SUTURE) ×3 IMPLANT
SUT PROLENE 3 0 SH1 36 (SUTURE) IMPLANT
SUT PROLENE 4 0 RB 1 (SUTURE) ×3
SUT PROLENE 4-0 RB1 .5 CRCL 36 (SUTURE) ×1 IMPLANT
SUT PROLENE 5 0 C 1 36 (SUTURE) ×6 IMPLANT
SUT PROLENE 6 0 C 1 30 (SUTURE) ×6 IMPLANT
SUT SILK  1 MH (SUTURE) ×4
SUT SILK 1 MH (SUTURE) ×1 IMPLANT
SUT SILK 2 0 SH CR/8 (SUTURE) IMPLANT
SUT VIC AB 2-0 CT1 27 (SUTURE) ×3
SUT VIC AB 2-0 CT1 TAPERPNT 27 (SUTURE) ×1 IMPLANT
SUT VIC AB 2-0 CTX 36 (SUTURE) IMPLANT
SUT VIC AB 3-0 SH 8-18 (SUTURE) ×6 IMPLANT
SYR 10ML LL (SYRINGE) ×9 IMPLANT
SYR 30ML LL (SYRINGE) ×6 IMPLANT
SYR 50ML LL SCALE MARK (SYRINGE) ×3 IMPLANT
TOWEL OR 17X26 10 PK STRL BLUE (TOWEL DISPOSABLE) ×6 IMPLANT
TRANSDUCER W/STOPCOCK (MISCELLANEOUS) ×6 IMPLANT
TRAY FOLEY SILVER 16FR TEMP (SET/KITS/TRAYS/PACK) ×3 IMPLANT
TUBING HIGH PRESSURE 120CM (CONNECTOR) ×3 IMPLANT
VALVE HEART TRANSCATH SZ3 29MM (Prosthesis & Implant Heart) ×2 IMPLANT
WIRE .035 3MM-J 145CM (WIRE) ×2 IMPLANT
WIRE AMPLATZ SS-J .035X180CM (WIRE) ×3 IMPLANT
WIRE BENTSON .035X145CM (WIRE) ×3 IMPLANT

## 2017-03-15 NOTE — Anesthesia Postprocedure Evaluation (Signed)
Anesthesia Post Note  Patient: Sigismund Cross [place]  Procedure(s) Performed: Procedure(s) (LRB): TRANSCATHETER AORTIC VALVE REPLACEMENT, TRANSFEMORAL (N/A) TRANSESOPHAGEAL ECHOCARDIOGRAM (TEE) (N/A)     Patient location during evaluation: PACU Anesthesia Type: MAC Level of consciousness: awake, awake and alert and oriented Pain management: pain level controlled Vital Signs Assessment: post-procedure vital signs reviewed and stable Respiratory status: spontaneous breathing, nonlabored ventilation, respiratory function stable and patient connected to face mask oxygen Postop Assessment: no headache Comments: Patient requiring VVI pacing at 80 bpm since valve deployment. Underlying rhythm is sinus bradycardia and nodal rhythm with HR 30-40    Last Vitals:  Vitals:   03/15/17 1307 03/15/17 1645  BP: (!) 156/54   Pulse:  69  Resp:  17  Temp:      Last Pain:  Vitals:   03/15/17 1154  TempSrc: Oral                 Jaeleen Inzunza COKER

## 2017-03-15 NOTE — CV Procedure (Signed)
[facility]  TAVR OPERATIVE NOTE   Date of Procedure:  03/15/2017  Preoperative Diagnosis: Severe Aortic Stenosis   Postoperative Diagnosis: Same   Procedure:    Transcatheter Aortic Valve Replacement - Transfemoral Approach  Edwards Sapien 3 THV (size 29 mm, model # [id number], serial # [id number])    Co-Surgeons:  Salvatore Decent. Cornelius Moras, MD and Verne Carrow, MD  Anesthesiologist:  Noreene Larsson  Echocardiographer:  Eden Emms  Pre-operative Echo Findings:  Severe aortic stenosis  Normal left ventricular systolic function  Post-operative Echo Findings:  No paravalvular leak  Normal left ventricular systolic function  BRIEF CLINICAL NOTE AND INDICATIONS FOR SURGERY  83 yo male with history of CAD, severe AS, HTN, HLD, DM with recent worsened dyspnea and fatigue felt to be due to his AS. Dobutamine echo with elevated gradients c/w severe AS. Cardiac cath April 2018 with moderate stable CAD.   During the course of the patient's preoperative work up they have been evaluated comprehensively by a multidisciplinary team of specialists coordinated through the Multidisciplinary [facility]ve Clinic in the [facility].  They have been demonstrated to suffer from symptomatic severe aortic stenosis as noted above. The patient has been counseled extensively as to the relative risks and benefits of all options for the treatment of severe aortic stenosis including long term medical therapy, conventional surgery for aortic valve replacement, and transcatheter aortic valve replacement.  The patient has been independently evaluated by two cardiac surgeons including Dr Cornelius Moras and Dr. Laneta Simmers, and they are felt to be at high risk for conventional surgical aortic valve replacement. Both surgeons indicated the patient would be a poor candidate for conventional surgery given advanced age and other comorbidities.   Based upon review of all of the patient's preoperative  diagnostic tests they are felt to be candidate for transcatheter aortic valve replacement using the transfemoral approach as an alternative to high risk conventional surgery.    Following the decision to proceed with transcatheter aortic valve replacement, a discussion has been held regarding what types of management strategies would be attempted intraoperatively in the event of life-threatening complications, including whether or not the patient would be considered a candidate for the use of cardiopulmonary bypass and/or conversion to open sternotomy for attempted surgical intervention.  The patient has been advised of a variety of complications that might develop peculiar to this approach including but not limited to risks of death, stroke, paravalvular leak, aortic dissection or other major vascular complications, aortic annulus rupture, device embolization, cardiac rupture or perforation, acute myocardial infarction, arrhythmia, heart block or bradycardia requiring permanent pacemaker placement, congestive heart failure, respiratory failure, renal failure, pneumonia, infection, other late complications related to structural valve deterioration or migration, or other complications that might ultimately cause a temporary or permanent loss of functional independence or other long term morbidity.  The patient provides full informed consent for the procedure as described and all questions were answered preoperatively.    DETAILS OF THE OPERATIVE PROCEDURE  PREPARATION:   The patient is brought to the operating room on the above mentioned date and central monitoring was established by the anesthesia team including placement of Swan-Ganz catheter and radial arterial line. The patient is placed in the supine position on the operating table.  Intravenous antibiotics are administered. Conscious sedated is used. A Foley catheter is placed.  Baseline transthoracic echocardiogram was performed. The patient's  chest, abdomen, both groins, and both lower extremities are prepared and draped in a sterile manner. A  time out procedure is performed.   PERIPHERAL ACCESS:   Using the modified Seldinger technique, femoral arterial and venous access were obtained with placement of 6 Fr sheaths on the right side.  A pigtail diagnostic catheter was passed through the femoral arterial sheath under fluoroscopic guidance into the aortic root.  A temporary transvenous pacemaker catheter was passed through the femoral venous sheath under fluoroscopic guidance into the right ventricle.  The pacemaker was tested to ensure stable lead placement and pacemaker capture. Aortic root angiography was performed in order to determine the optimal angiographic angle for valve deployment.  TRANSFEMORAL ACCESS:  A micropuncture kit was used to access the right femoral artery. Angiography used to confirm position. Pre-closure with double ProGlide closure devices. The patient was heparinized systemically and ACT verified > 250 seconds.    A 16 Fr transfemoral E-sheath was introduced into the left femoral artery after progressively dilating over an Amplatz superstiff wire. An AL-2 catheter was used to direct a straight-tip exchange length wire across the native aortic valve into the left ventricle. This was exchanged out for a pigtail catheter and position was confirmed in the LV apex. Simultaneous LV and Ao pressures were recorded.  The pigtail catheter was then exchanged for an Amplatz Extra-stiff wire in the LV apex.    TRANSCATHETER HEART VALVE DEPLOYMENT:  An Edwards Sapien 3 THV (size 29 mm) was prepared and crimped per manufacturer's guidelines, and the proper orientation of the valve is confirmed on the [employer] delivery system. The valve was advanced through the introducer sheath using normal technique until in an appropriate position in the abdominal aorta beyond the sheath tip. The balloon was then retracted and using the  fine-tuning wheel was centered on the valve. The valve was then advanced across the aortic arch using appropriate flexion of the catheter. The valve was carefully positioned across the aortic valve annulus. The Commander catheter was retracted using normal technique. Once final position of the valve has been confirmed by angiographic assessment, the valve is deployed while temporarily holding ventilation and during rapid ventricular pacing to maintain systolic blood pressure < 50 mmHg and pulse pressure < 10 mmHg. The balloon inflation is held for >3 seconds after reaching full deployment volume. Once the balloon has fully deflated the balloon is retracted into the ascending aorta and valve function is assessed using TEE. There is felt to be no paravalvular leak and no central aortic insufficiency.  The patient's hemodynamic recovery following valve deployment is good.  The deployment balloon and guidewire are both removed. Echo demostrated acceptable post-procedural gradients, stable mitral valve function, and no AI.    PROCEDURE COMPLETION:  The sheath was then removed and the closure devices were completed. Protamine was administered once femoral arterial repair was complete. The pigtail catheter and femoral sheaths were removed with manual pressure used for hemostasis. Temporary pacemaker left in place.   The patient tolerated the procedure well and is transported to the surgical intensive care in stable condition. There were no immediate intraoperative complications. All sponge instrument and needle counts are verified correct at completion of the operation.   No blood products were administered during the operation.  The patient received a total of 75.8 mL of intravenous contrast during the procedure.  Lauree Chandler MD 03/15/2017 3:44 PM

## 2017-03-15 NOTE — Anesthesia Procedure Notes (Signed)
Arterial Line Insertion Start/End7/06/2017 12:45 PM, 03/15/2017 1:07 PM Performed by: Neldon Newport, CRNA  Patient location: Pre-op. Preanesthetic checklist: patient identified Right, radial was placed Catheter size: 20 G Hand hygiene performed  and maximum sterile barriers used   Attempts: 2 Procedure performed without using ultrasound guided technique. Following insertion, dressing applied. Post procedure assessment: normal  Patient tolerated the procedure well with no immediate complications.

## 2017-03-15 NOTE — Op Note (Signed)
[facility]   MULTIDISCIPLINARY HEART VALVE TEAM   TAVR OPERATIVE NOTE   Date of Procedure:  03/15/2017  Preoperative Diagnosis: Severe Aortic Stenosis   Postoperative Diagnosis: Same   Procedure:    Transcatheter Aortic Valve Replacement - Percutaneous Left Transfemoral Approach  Edwards Sapien 3 THV (size 29 mm, model # [id number], serial # [id number])   Co-Surgeons:  Lauree Chandler, MD and Valentina Gu. Roxy Manns, MD   Anesthesiologist:  Roberts Gaudy, MD  Echocardiographer:  Jenkins Rouge, MD  Pre-operative Echo Findings:  Severe aortic stenosis  Normal left ventricular systolic function  Post-operative Echo Findings:  No paravalvular leak  Normal left ventricular systolic function   BRIEF CLINICAL NOTE AND INDICATIONS FOR SURGERY  Patient is an 83 year old male with history of aortic stenosis, coronary artery disease, chronic diastolic congestive heart failure, hypertension, obesity, obstructive sleep apnea, right bundle branch block, insulin-dependent type 2 diabetes mellitus, and degenerative arthritis with somewhat limited mobility who has been referred for surgical consultation to discuss treatment options for management of severe symptomatic aortic stenosis. The patient was referred to Dr. Tamala Julian for cardiology consultation several years ago because of right bundle branch block with abnormal EKG. He underwent echocardiogram and diagnostic cardiac catheterization and was found to have mild aortic stenosis and nonobstructive coronary artery disease. He has been followed regularly ever since in the severity of aortic stenosis has gradually progressed. Transthoracic echocardiogram performed in 2017 revealed normal left ventricular systolic function with ejection fraction estimated 55-60% and moderate aortic stenosis with peak velocity across the aortic valve measured 3.65 m/s corresponding to mean transvalvular gradient estimated 29 mmHg. This was unchanged in  comparison with previous echocardiogram in 2015. Over the past year the patient has developed progressive symptoms of exertional shortness of breath and fatigue.  Follow-up transthoracic echocardiogram performed 10/01/2016 revealed similar findings with peak velocity across the aortic valve measured 3.5 m/s corresponding to mean transvalvular gradient estimated 28 mmHg. Left ventricular systolic function remained normal with ejection fraction estimated 55-60%.  Diagnostic cardiac catheterization was performed 12/28/2016. Catheterization revealed moderate nonobstructive coronary artery disease with 100% stenosis of a small second diagonal branch, 30-50% stenosis in the left anterior descending coronary artery, eccentric 50-90% proximal stenosis at the origin of left posterior descending coronary artery, and otherwise nonobstructive coronary artery disease. Mean transvalvular gradient measured at catheterization was 29 mmHg corresponding to aortic valve area calculated 1.35 cm.  The patient was referred to the multidisciplinary heart valve clinic and evaluated by Dr. Angelena Form. The patient subsequently underwent dobutamine stress echocardiography, confirming the presence of paradoxical low flow normal ejection fraction severe aortic stenosis with increasing mean transvalvular gradient from 36 mmHg at baseline to 58 mmHg with dobutamine stress her the patient was subsequently referred for surgical consultation.    During the course of the patient's preoperative work up they have been evaluated comprehensively by a multidisciplinary team of specialists coordinated through the [facility] in the [place] [facility].  They have been demonstrated to suffer from symptomatic severe aortic stenosis as noted above. The patient has been counseled extensively as to the relative risks and benefits of all options for the treatment of severe aortic stenosis including long term medical  therapy, conventional surgery for aortic valve replacement, and transcatheter aortic valve replacement.  All questions have been answered, and the patient provides full informed consent for the operation as described.   DETAILS OF THE OPERATIVE PROCEDURE  PREPARATION:    The patient is brought  to the operating room on the above mentioned date and central monitoring was established by the anesthesia team including placement of a central venous line and radial arterial line. The patient is placed in the supine position on the operating table.  Intravenous antibiotics are administered. The patient is monitored closely throughout the procedure under conscious sedation. Baseline transthoracic echocardiogram was performed. The patient's chest, abdomen, both groins, and both lower extremities are prepared and draped in a sterile manner. A time out procedure is performed.   PERIPHERAL ACCESS:    Using the modified Seldinger technique, femoral arterial and venous access was obtained with placement of 6 Fr sheaths on the right side.  A pigtail diagnostic catheter was passed through the right arterial sheath under fluoroscopic guidance into the aortic root.  A temporary transvenous pacemaker catheter was passed through the right femoral venous sheath under fluoroscopic guidance into the right ventricle.  The pacemaker was tested to ensure stable lead placement and pacemaker capture. Aortic root angiography was performed in order to determine the optimal angiographic angle for valve deployment.   TRANSFEMORAL ACCESS:   Percutaneous transfemoral access and sheath placement was performed by Dr. Angelena Form using ultrasound guidance.  The left common femoral artery was cannulated using a micropuncture needle and appropriate location was verified using hand injection angiogram.  A pair of Abbott Perclose percutaneous closure devices were placed and a 6 French sheath replaced into the femoral artery.  The patient was  heparinized systemically and ACT verified > 250 seconds.    A 16 Fr transfemoral E-sheath was introduced into the left common femoral artery after progressively dilating over an Amplatz superstiff wire. An AL-2 catheter was used to direct a straight-tip exchange length wire across the native aortic valve into the left ventricle. This was exchanged out for a pigtail catheter and position was confirmed in the LV apex. Simultaneous LV and Ao pressures were recorded.  The pigtail catheter was exchanged for an Amplatz Extra-stiff wire in the LV apex.  Echocardiography was utilized to confirm appropriate wire position and no sign of entanglement in the mitral subvalvular apparatus.   TRANSCATHETER HEART VALVE DEPLOYMENT:   An Edwards Sapien 3 transcatheter heart valve (size 29 mm, model #[id number], serial #[id number]) was prepared and crimped per manufacturer's guidelines, and the proper orientation of the valve is confirmed on the [employer] delivery system. The valve was advanced through the introducer sheath using normal technique until in an appropriate position in the abdominal aorta beyond the sheath tip. The balloon was then retracted and using the fine-tuning wheel was centered on the valve. The valve was then advanced across the aortic arch using appropriate flexion of the catheter. The valve was carefully positioned across the aortic valve annulus. The Commander catheter was retracted using normal technique. Once final position of the valve has been confirmed by angiographic assessment, the valve is deployed while temporarily holding ventilation and during rapid ventricular pacing to maintain systolic blood pressure < 50 mmHg and pulse pressure < 10 mmHg. The balloon inflation is held for >3 seconds after reaching full deployment volume. Once the balloon has fully deflated the balloon is retracted into the ascending aorta and valve function is assessed using echocardiography. There is felt to be no  paravalvular leak and no central aortic insufficiency.  The patient's hemodynamic recovery following valve deployment is notable for several minutes of hypotension and bradycardia requiring transvenous pacing, but he ultimately recovered uneventfully.  The deployment balloon and guidewire are both removed.  PROCEDURE COMPLETION:   The sheath was removed and femoral artery closure performed by Dr Angelena Form.  Protamine was administered once femoral arterial repair was complete. The pigtail catheters and femoral sheaths were removed with manual pressure used for hemostasis.  The temporary transvenous pacemaker was left in place for pacing as needed.  The patient tolerated the procedure well and is transported to the surgical intensive care in stable condition. There were no immediate intraoperative complications. All sponge instrument and needle counts are verified correct at completion of the operation.   No blood products were administered during the operation.  The patient received a total of 75.8 mL of intravenous contrast during the procedure.   Rexene Alberts, MD 03/15/2017 3:40 PM

## 2017-03-15 NOTE — Anesthesia Preprocedure Evaluation (Addendum)
Anesthesia Evaluation  Patient identified by MRN, date of birth, ID band Patient awake    Reviewed: Allergy & Precautions, NPO status , Patient's Chart, lab work & pertinent test results  Airway Mallampati: III  TM Distance: >3 FB Neck ROM: Full    Dental  (+) Teeth Intact, Dental Advisory Given   Pulmonary    breath sounds clear to auscultation       Cardiovascular hypertension,  Rhythm:Regular Rate:Normal     Neuro/Psych    GI/Hepatic   Endo/Other  diabetes  Renal/GU      Musculoskeletal   Abdominal (+) + obese,   Peds  Hematology   Anesthesia Other Findings   Reproductive/Obstetrics                            Anesthesia Physical Anesthesia Plan  ASA: III  Anesthesia Plan: MAC   Post-op Pain Management:    Induction:   PONV Risk Score and Plan: Ondansetron  Airway Management Planned: Natural Airway and Simple Face Mask  Additional Equipment: Arterial line, CVP and Ultrasound Guidance Line Placement  Intra-op Plan:   Post-operative Plan:   Informed Consent: I have reviewed the patients History and Physical, chart, labs and discussed the procedure including the risks, benefits and alternatives for the proposed anesthesia with the patient or authorized representative who has indicated his/her understanding and acceptance.   Dental advisory given  Plan Discussed with: CRNA and Anesthesiologist  Anesthesia Plan Comments:        Anesthesia Quick Evaluation

## 2017-03-15 NOTE — Progress Notes (Signed)
  Echocardiogram 2D Echocardiogram has been performed.  Todd Romero 03/15/2017, 3:44 PM

## 2017-03-15 NOTE — H&P (View-Only) (Signed)
Patient ID: Todd Romero, male   DOB: 01/03/1934, 83 y.o.   MRN: [id number]  [facility]  MULTIDISCIPLINARY HEART VALVE CLINIC  CARDIOTHORACIC SURGERY CONSULTATION REPORT  Referring Provider is Belva Crome, MD PCP is [name], MD  Chief Complaint  Patient presents with  . Aortic Stenosis    2nd TAVR eval, review all studes, surgery scheduled for 03/15/17    HPI:  The patient is an 83 year old gentleman with hypertension, obesity, DM, OSA but did not tolerate CPAP, chronic diastolic heart failure, degenerative arthritis particularly of his right knee and known aortic stenosis. His echo on 01/05/2016 showed moderate AS with a mean gradient of 29 mm Hg and normal LV function. Over the past year he has developed progressive exertional shortness of breath and fatigue. He has not had any chest discomfort or dizziness. He has had chronic LE edema and wears compression socks. He had a follow up echo on 10/01/2016 that showed a mean gradient of 28 mm Hg and a normal LVEF of 55-60% that was unchanged. Cardiac cath on 12/28/2016 showed moderate CAD with 100% occlusion of a small D2, 30-50% LAD stenosis, 90% stenosis of a small OM4 and 90% stenosis of the left PDA. The RCA had 50% proximal stenosis. The mean transvalvular gradient was 29 mm Hg. He was evaluated by Dr Angelena Form and had a dobutamine stress echo confirming low flow, normal EF severe AS with an increase in the mean gradient from 36 to 58 mm Hg with peak stress.  The patient is here with his wife today. He is from [place] and moved here in 2004. He has been fairly active until the past year when he has been limited due to fatigue and shortness of breath with exertion. He has severe arthritis in the right knee that he has been managing with injections but may need surgery.   Past Medical History:  Diagnosis Date  . Aortic stenosis   . CAD (coronary artery disease)   . Diabetes ([place])   . Hyperlipidemia   . Obesity (BMI  30-39.9) 06/18/2015  . OSA (obstructive sleep apnea)    Severe with AHI 27/hr now on CPAP  . Pure hypercholesterolemia   . RBBB   . Type II or unspecified type diabetes mellitus without mention of complication, not stated as uncontrolled   . Unspecified essential hypertension     Past Surgical History:  Procedure Laterality Date  . CARPAL TUNNEL RELEASE    . KNEE CARTILAGE SURGERY    . PARTIAL COLECTOMY    . RIGHT/LEFT HEART CATH AND CORONARY ANGIOGRAPHY N/A 12/28/2016   Procedure: Right/Left Heart Cath and Coronary Angiography;  Surgeon: Belva Crome, MD;  Location: [facility];  Service: Cardiovascular;  Laterality: N/A;  . VEIN SURGERY      Family History  Problem Relation Age of Onset  . Heart failure Father   . Emphysema Father   . Hypertension Mother   . Cancer Sister   . Healthy Sister     Social History   Social History  . Marital status: Married    Spouse name: N/A  . Number of children: 4  . Years of education: N/A   Occupational History  . Construction-Retired    Social History Main Topics  . Smoking status: Never Smoker  . Smokeless tobacco: Never Used  . Alcohol use No  . Drug use: No  . Sexual activity: Not on file   Other Topics Concern  .  Not on file   Social History Narrative  . No narrative on file    Current Outpatient Prescriptions  Medication Sig Dispense Refill  . ACCU-CHEK AVIVA PLUS test strip USE AS INSTRUCTED TO CHECK BLOOD SUGAR TWICE A DAY 200 each 2  . acetaminophen (TYLENOL) 500 MG tablet Take 500 mg by mouth every 4 (four) hours as needed for moderate pain or headache.    [name] amLODipine (NORVASC) 10 MG tablet Take 5 mg by mouth daily.    [name] aspirin EC 81 MG tablet Take 81 mg by mouth daily.    . B-D INS SYR ULTRAFINE 1CC/31G 31G X 5/16" 1 ML MISC USE TO INJECT INSULIN DAILY 100 each 3  . ferrous sulfate 325 (65 FE) MG tablet Take 325 mg by mouth daily with breakfast.    . fish oil-omega-3 fatty acids 1000 MG capsule Take  1 g by mouth daily at 12 noon.     . furosemide (LASIX) 40 MG tablet Take 40 mg by mouth daily at 12 noon.     [name] glimepiride (AMARYL) 2 MG tablet Take 0.5 tablets (1 mg total) by mouth daily. 45 tablet 1  . insulin lispro (HUMALOG KWIKPEN) 100 UNIT/ML KiwkPen 4-8 units before the main meals of the day (Patient taking differently: Inject 4-8 Units into the skin daily after supper. 4-8 units before the main meals of the day) 15 mL 1  . insulin NPH Human (HUMULIN N,NOVOLIN N) 100 UNIT/ML injection Inject 0.22 mLs (22 Units total) into the skin at bedtime. Uses Vial (Patient taking differently: Inject 26 Units into the skin at bedtime. Uses Vial) 20 mL 1  . Insulin Pen Needle (B-D UF III MINI PEN NEEDLES) 31G X 5 MM MISC USE 2 PEN NEEDLE PER DAY WITH HUMALOG AND VICTOZA 200 each 3  . liraglutide 18 MG/3ML SOPN Inject 0.3 mLs (1.8 mg total) into the skin at bedtime. 15 pen 2  . metFORMIN (GLUMETZA) 500 MG (MOD) 24 hr tablet Take 500 mg by mouth 2 (two) times daily with a meal. (morning & noon)    . Multiple Vitamin (MULTIVITAMIN WITH MINERALS) TABS tablet Take 1 tablet by mouth daily.    . Multiple Vitamins-Minerals (PRESERVISION AREDS 2 PO) Take 1 capsule by mouth 2 (two) times daily.    [name] olmesartan (BENICAR) 40 MG tablet Take 1 tablet (40 mg total) by mouth daily. 90 tablet 3  . pravastatin (PRAVACHOL) 80 MG tablet Take 80 mg by mouth every evening.     [name] spironolactone (ALDACTONE) 25 MG tablet Take 25 mg by mouth daily at 6 PM.     . tamsulosin (FLOMAX) 0.4 MG CAPS capsule TAKE 1 CAPSULE DAILY (Patient taking differently: TAKE 1 CAPSULE DAILY IN THE EVENING) 90 capsule 2   No current facility-administered medications for this visit.     Allergies  Allergen Reactions  . Morphine And Related Nausea And Vomiting      Review of Systems:   General:                      normal appetite, decreased energy, no weight gain, no weight loss, no fever             Cardiac:                       no chest  pain with exertion, no chest pain at rest, + SOB with exertion, no resting SOB, no PND, no orthopnea,  no palpitations, no arrhythmia, no atrial fibrillation, + LE edema, no dizzy spells, no syncope             Respiratory:                 + exertional shortness of breath, no home oxygen, no productive cough, no dry cough, no bronchitis, no wheezing, no hemoptysis, no asthma, no pain with inspiration or cough, + sleep apnea, no CPAP at night             GI:                               no difficulty swallowing, no reflux, no frequent heartburn, no hiatal hernia, no abdominal pain, no constipation, no diarrhea, no hematochezia, no hematemesis, no melena             GU:                              no dysuria,  + frequency, no urinary tract infection, no hematuria, no enlarged prostate, no kidney stones, no kidney disease             Vascular:                     no pain suggestive of claudication, no pain in feet, no leg cramps, no varicose veins, no DVT, no non-healing foot ulcer             Neuro:                         no stroke, no TIA's, no seizures, no headaches, no temporary blindness one eye,  no slurred speech, no peripheral neuropathy, + chronic pain, mild instability of gait, no memory/cognitive dysfunction             Musculoskeletal:         + arthritis, + joint swelling, no myalgias, + difficulty walking, mildly limited mobility              Skin:                            no rash, no itching, no skin infections, no pressure sores or ulcerations             Psych:                         no anxiety, no depression, no nervousness, no unusual recent stress             Eyes:                           no blurry vision, + floaters, no recent vision changes, no wears glasses or contacts             ENT:                            + hearing loss, no loose or painful teeth, no dentures, last saw dentist 01/2016             Hematologic:               no easy bruising,  no abnormal bleeding, no  clotting disorder, no frequent epistaxis             Endocrine:                   + diabetes, does check CBG's at home                   Physical Exam:   BP 137/73   Pulse 73   Resp 20   Ht _0  (1.753 m)   Wt 225 lb (102.1 kg)   SpO2 97% Comment: RA  BMI 33.23 kg/m   General:  Obese, well-appearing  HEENT:  Unremarkable, NCAT, PERLA, EOMI, oropharynx clear. Teeth in good condition.  Neck:   no JVD, no bruits, no adenopathy or thyromegaly  Chest:   clear to auscultation, symmetrical breath sounds, no wheezes, no rhonchi   CV:   RRR, grade III/VI crescendo/decrescendo murmur heard best at RSB,  no diastolic murmur  Abdomen:  soft, non-tender, no masses or organomegaly  Extremities:  warm, well-perfused, pulses diminished but palpable in feet , mild LE edema  Rectal/GU  Deferred  Neuro:   Grossly non-focal and symmetrical throughout  Skin:   Clean and dry, no rashes, no breakdown   Diagnostic Tests:  [facility]                        [address], [place] [address]                            [phone]  ------------------------------------------------------------------- Stress Echocardiography  Patient:    Elan, Brainerd MR #:       [id number] Study Date: 02/07/2017 Gender:     M Age:        24 Height:     175.3 cm Weight:     107 kg BSA:        2.32 m^2 Pt. Status: Room:   ATTENDING    Mertie Moores, M.D.  ORDERING     McAlhany, Christopher  REFERRING    McAlhany, [place], Outpatient  SONOGRAPHER  [facility], RDCS  cc:  ------------------------------------------------------------------- LV EF: 55% -   60%  ------------------------------------------------------------------- Indications:      Aortic Stenosis (I35.0).  ------------------------------------------------------------------- History:   PMH:  Obesity, Obstructive Sleep Apnea, Moderate Aortic Stenosis Pre-op  evaluation for TAVR  Dyspnea.  Coronary artery disease.  Aortic valve disease.  Risk factors:  Family history of coronary artery disease. Hypertension. Diabetes mellitus. Dyslipidemia.  Medications:  No other medications.  ------------------------------------------------------------------- Study Conclusions  - Left ventricle: The cavity size was normal. Systolic function was   normal. The estimated ejection fraction was in the range of 55%   to 60%. - Aortic valve: There was severe stenosis. Valve area (VTI): 1.46   cm^2. Valve area (Vmax): 1.51 cm^2. Valve area (Vmean): 1.33   cm^2. - Left atrium: The atrium was moderately dilated.  Impressions:  - Severe aortic stenosis with increase of mean AV gradient from   baseline of 36 to 58 with Dobutamine stress.  ------------------------------------------------------------------- Study data:   Study status:  Routine.  Consent:  The risks, benefits, and alternatives to the procedure were explained to the patient and informed consent was obtained.  Procedure:  Initial setup. The patient was brought to the laboratory. A baseline ECG was recorded. Surface ECG leads and automatic cuff blood pressure measurements were monitored.  Dobutamine stress test. Stress testing was performed, with dobutamine infusion from 5 to 20 mcg/kg/min by 10 mcg/kg/min increments. The infusion was terminated due to maximal dose administration. Transthoracic stress echocardiography for left ventricular function evaluation, assessment of valvular function, and preoperative diagnosis. Images were captured at baseline, low dose, peak dose, and recovery. Study completion:  The patient tolerated the procedure well. There were no complications.          Dobutamine. Stress echocardiography.  2D.  Birthdate:  Patient birthdate: 1933-11-22. Age:  Patient is 83 yr old.  Sex:  Gender: male.    BMI: 34.8 kg/m^2.  Blood pressure:     134/76  Patient status:   Outpatient. Study date:  Study date: 02/07/2017. Study time: 02:54 PM.  -------------------------------------------------------------------  ------------------------------------------------------------------- Left ventricle:  The cavity size was normal. Systolic function was normal. The estimated ejection fraction was in the range of 55% to 60%.  ------------------------------------------------------------------- Aortic valve:   Doppler:   There was severe stenosis.      VTI ratio of LVOT to aortic valve: 0.28. Valve area (VTI): 1.46 cm^2. Indexed valve area (VTI): 0.63 cm^2/m^2. Peak velocity ratio of LVOT to aortic valve: 0.28. Valve area (Vmax): 1.51 cm^2. Indexed valve area (Vmax): 0.65 cm^2/m^2. Mean velocity ratio of LVOT to aortic valve: 0.25. Valve area (Vmean): 1.33 cm^2. Indexed valve area (Vmean): 0.57 cm^2/m^2.    Mean gradient (S): 50 mm Hg. Peak gradient (S): 84 mm Hg.  ------------------------------------------------------------------- Aorta:  The aorta was mildly dilated.  ------------------------------------------------------------------- Left atrium:  The atrium was moderately dilated.   ------------------------------------------------------------------- Stress protocol:  +-----------------------+---------------+--+------------+--------+ !Stage                  !Time into phase!HR!BP (mmHg)   !Symptoms! +-----------------------+---------------+--+------------+--------+ !Baseline               !---------------!63!134/76 (95) !None    ! +-----------------------+---------------+--+------------+--------+ !Dobutamine 5 ug/kg/min !3 min          !63!134/66 (89) !--------! +-----------------------+---------------+--+------------+--------+ !Dobutamine 10 ug/kg/min!3 min          !66!142/67 (92) !--------! +-----------------------+---------------+--+------------+--------+ !Dobutamine 20 ug/kg/min!3 min          !94!114/54 (74)  !--------! +-----------------------+---------------+--+------------+--------+ !Immediate post stress  !---------------!96!116/55 (75) !--------! +-----------------------+---------------+--+------------+--------+ !Recovery; 1 min        !---------------!85!152/75 (101)!--------! +-----------------------+---------------+--+------------+--------+ !Recovery; 5 min        !---------------!42!595/63 (99) !--------! +-----------------------+---------------+--+------------+--------+ !Late recovery          !---------------!87!564/33 (96) !--------! +-----------------------+---------------+--+------------+--------+  ------------------------------------------------------------------- Stress results:   Maximal heart rate during stress was 96 bpm (69% of maximal predicted heart rate). The maximal predicted heart rate was 140 bpm.The heart rate response to stress was normal. The rate-pressure product for the peak heart rate and blood pressure was 12920 mm Hg/min.  ------------------------------------------------------------------- Baseline:  - LV global systolic function was normal. - Normal wall motion; no LV regional wall motion abnormalities.  Low dose:  - LV global systolic function was at the upper limits of normal. - No evidence for new LV regional wall motion abnormalities.  Peak stress:  - LV global systolic function was vigorous. - No evidence for new LV regional wall motion abnormalities.  Recovery:  - LV global systolic function was vigorous. - No evidence for new LV regional wall motion abnormalities.  ------------------------------------------------------------------- Post procedure conclusions Ascending Aorta:  -  The aorta was mildly dilated.  ------------------------------------------------------------------- Stress echo results:     With Dobutamine infusion, the mean AV gradient increased from 36 mmHg to 58.  Severe Aortic stenosis with increasing  gradient with Dobutamine stress.  ------------------------------------------------------------------- Measurements   Left ventricle                            Value          Reference  LV ID, ED, PLAX chordal           (H)     56.4  mm       43 - 52  LV ID, ES, PLAX chordal                   37.8  mm       23 - 38  LV fx shortening, PLAX chordal            33    %        >=29  LV PW thickness, ED                       10.9  mm       ---------  IVS/LV PW ratio, ED                       1.08           <=1.3  Stroke volume, 2D                         143   ml       ---------  Stroke volume/bsa, 2D                     62    ml/m^2   ---------    Ventricular septum                        Value          Reference  IVS thickness, ED                         11.8  mm       ---------    LVOT                                      Value          Reference  LVOT ID, S                                26    mm       ---------  LVOT area                                 5.31  cm^2     ---------  LVOT peak velocity, S                     130   cm/s     ---------  LVOT mean velocity, S  82.7  cm/s     ---------  LVOT VTI, S                               26.9  cm       ---------  LVOT peak gradient, S                     7     mm Hg    ---------    Aortic valve                              Value          Reference  Aortic valve peak velocity, S             457   cm/s     ---------  Aortic valve mean velocity, S             330   cm/s     ---------  Aortic valve VTI, S                       97.8  cm       ---------  Aortic mean gradient, S                   50    mm Hg    ---------  Aortic peak gradient, S                   84    mm Hg    ---------  VTI ratio, LVOT/AV                        0.28           ---------  Aortic valve area, VTI                    1.46  cm^2     ---------  Aortic valve area/bsa, VTI                0.63  cm^2/m^2 ---------  Velocity ratio, peak, LVOT/AV              0.28           ---------  Aortic valve area, peak velocity          1.51  cm^2     ---------  Aortic valve area/bsa, peak               0.65  cm^2/m^2 ---------  velocity  Velocity ratio, mean, LVOT/AV             0.25           ---------  Aortic valve area, mean velocity          1.33  cm^2     ---------  Aortic valve area/bsa, mean               0.57  cm^2/m^2 ---------  velocity    Aorta                                     Value          Reference  Aortic root ID, ED  41    mm       ---------  Ascending aorta ID, A-P, S                37    mm       ---------    Left atrium                               Value          Reference  LA ID, A-P, ES                            52    mm       ---------  LA ID/bsa, A-P                    (H)     2.24  cm/m^2   <=2.2  Legend: (L)  and  (H)  mark values outside specified reference range.  ------------------------------------------------------------------- Prepared and Electronically Authenticated by  Mertie Moores, M.D. 2018-06-04T17:04:13   Physicians   Panel Physicians Referring Physician Case Authorizing Physician  Belva Crome, MD (Primary)    Procedures   Right/Left Heart Cath and Coronary Angiography  Conclusion    Difficult procedure due to severe angulation in the right innominate/ascending aortic region that produced catheter resistance to torque and advancement.  Moderate aortic stenosis with a transvalvular gradient of 29 mmHg (mean) and 33-38 mmHg (peak to peak). Calculated aortic valve area 1.35 cm.  Elevated left ventricular end-diastolic pressure and pulmonary capillary wedge pressure. Known LV systolic function with EF greater than 50% by recent echo suggesting chronic diastolic left heart failure.  Heavy three-vessel coronary calcification.  100% stenosis in the mid body of the second diagonal. There is diffuse 30-50% narrowing in the LAD.  The circumflex coronary is dominant.  Moderate to heavy calcification is noted throughout the distal vessel and in the proximal segment. There is eccentric 50-90% stenosis proximal to the origin of the L-PDA.  Nondominant right coronary. Not selectively engaged due to poor catheter control. The vessel is patent and supplies only the right ventricle.  RECOMMENDATIONS:   Coronary status is essentially unchanged compared to 5 years ago.  Moderate aortic stenosis based upon hemodynamics obtained from this case. This matches the recent echo.  Explanation for exertional fatigue is uncertain. Consider referral to the valve clinic for a second opinion concerning management of aortic stenosis. May need dobutamine echo as he is potentially a low gradient severe aortic stenosis substrate.  Indications   Aortic stenosis, moderate [I35.0 (ICD-10-CM)]  Coronary artery disease involving native coronary artery of native heart without angina pectoris [I25.10 (ICD-10-CM)]  Procedural Details/Technique   Technical Details The right radial area was sterilely prepped and draped. Intravenous sedation with Versed and fentanyl was administered. 1% Xylocaine was infiltrated to achieve local analgesia. Using real time vascular ultrasound, a double wall stick with an angiocath was utilized to obtain intra-arterial access. The modified Seldinger technique was used to place a 69F " Slender" sheath in the right radial artery. Weight based heparin was administered. Coronary angiography was done using 5 F catheters. The procedure was very difficult due to significant angulation and tortuosity in the innominate/aortic bifurcation. Right coronary angiography was performed in multiple catheters. Selective engagement was not possible. The vessel is nondominant, patent, and appears to have diffuse disease. Left ventricular hemodymic recordings were  done using the JR 4 catheter.. Left coronary angiography was performed with a JL 3.5 cm.  Right heart catheterization was  performed by exchanging an antecubital angiocath IV for a 5 French brachial sheath. 1% Xylocaine local infiltration at the IV site was given prior to sheath exchange. A double glove technique was used. The modified Seldinger technique was employed. After sheath insertion, right heart cath was performed using a 5 French balloon tipped catheter. Pressures were recorded in each chamber. A main pulmonary artery O2 saturation was obtained. A wedge pressure was also measured.  Right radial hemostasis was achieved with a compression/pneumatic bandage.  During this procedure the patient is administered a total of Versed 1 mg and Fentanyl 50 mg to achieve and maintain moderate conscious sedation. The patient's heart rate, blood pressure, and oxygen saturation are monitored continuously during the procedure. The period of conscious sedation is 60 minutes, of which I was present face-to-face 100% of this time.   Estimated blood loss <50 mL.  During this procedure the patient was administered the following to achieve and maintain moderate conscious sedation: Versed 1 mg, Fentanyl 50 mcg, while the patient's heart rate, blood pressure, and oxygen saturation were continuously monitored. The period of conscious sedation was 60 minutes, of which I was present face-to-face 100% of this time.    Coronary Findings   Dominance: Left  Left Anterior Descending  Prox LAD to Mid LAD lesion, 50% stenosed.  First Diagonal Branch  Vessel is small in size.  Second Diagonal [employer] 2nd Diag lesion, 75% stenosed.  2nd Diag lesion, 100% stenosed. The lesion is calcified.  Left Circumflex  Mid Cx lesion, 40% stenosed.  First Obtuse Marginal Branch  Vessel is small in size.  Third Obtuse Marginal Branch  Vessel is small in size.  3rd Mrg lesion, 60% stenosed.  Fourth Obtuse Marginal Branch  Vessel is small in size.  Ost 4th Mrg to 4th Mrg lesion, 90% stenosed.  Left Posterior Descending Artery  Ost LPDA to LPDA  lesion, 90% stenosed. The lesion is calcified.  Right Coronary Artery  Ost RCA to Mid RCA lesion, 50% stenosed. The lesion is segmental.  Right Heart   Right Heart Pressures Hemodynamic findings consistent with mild pulmonary hypertension.    Left Heart   Left Ventricle LV end diastolic pressure is moderately elevated.    Coronary Diagrams   Diagnostic Diagram       Implants     No implant documentation for this case.  PACS Images   Show images for Cardiac catheterization   Link to Procedure Log   Procedure Log    Hemo Data    Most Recent Value  Fick Cardiac Output 6.32 L/min  Fick Cardiac Output Index 2.86 (L/min)/BSA  Aortic Mean Gradient 29 mmHg  Aortic Peak Gradient 31 mmHg  Aortic Valve Area 1.35  Aortic Value Area Index 0.61 cm2/BSA  RA A Wave 12 mmHg  RA V Wave 10 mmHg  RA Mean 9 mmHg  RV Systolic Pressure 44 mmHg  RV Diastolic Pressure 4 mmHg  RV EDP 12 mmHg  PA Systolic Pressure 48 mmHg  PA Diastolic Pressure 11 mmHg  PA Mean 25 mmHg  PW A Wave 24 mmHg  PW V Wave 27 mmHg  PW Mean 22 mmHg  AO Systolic Pressure 062 mmHg  AO Diastolic Pressure 62 mmHg  AO Mean 88 mmHg  LV Systolic Pressure 694 mmHg  LV Diastolic Pressure 10 mmHg  LV EDP 29 mmHg  Arterial  Occlusion Pressure Extended Systolic Pressure 811 mmHg  Arterial Occlusion Pressure Extended Diastolic Pressure 58 mmHg  Arterial Occlusion Pressure Extended Mean Pressure 86 mmHg  Left Ventricular Apex Extended Systolic Pressure 914 mmHg  Left Ventricular Apex Extended Diastolic Pressure 11 mmHg  Left Ventricular Apex Extended EDP Pressure 21 mmHg  QP/QS 1  TPVR Index 8.74 HRUI  TSVR Index 30.77 HRUI  PVR SVR Ratio 0.04  TPVR/TSVR Ratio 0.28   CT CORONARY MORPH W/CTA COR W/SCORE W/CA W/CM &/OR WO/CM (Accession [id number]) (Order [id number])  Imaging  Date: 02/16/2017 Department: [facility] CT IMAGING Released By: Reggy Eye Authorizing: Burnell Blanks, MD   Exam Information   Status Exam Begun  Exam Ended   Final [99] 02/16/2017 10:09 AM 02/16/2017 11:01 AM  PACS Images   Show images for CT CORONARY MORPH W/CTA COR W/SCORE W/CA W/CM &/OR WO/CM  Addendum   ADDENDUM REPORT: 02/16/2017 18:16  CLINICAL DATA:  Aortic stenosis  EXAM: Cardiac TAVR CT  TECHNIQUE: The patient was scanned on a Philips 256 scanner. A 120 kV retrospective scan was triggered in the descending thoracic aorta at 111 HU's. Gantry rotation speed was 270 msecs and collimation was .9 mm. No beta blockade or nitro were given. The 3D data set was reconstructed in 5% intervals of the R-R cycle. Systolic and diastolic phases were analyzed on a dedicated work station using MPR, MIP and VRT modes. The patient received 80 cc of contrast.  FINDINGS: Aortic Valve: Tri leaflet and heavily calcified. Moderate calcification of the STJ.  Aorta: Normal size and origin of great vessels. Moderate calcific atherosclerosis  Sinotubular Junction:  28 mm  Ascending Thoracic Aorta:  34 mm  Aortic Arch:  32 mm  Descending Thoracic Aorta:  25 mm  Sinus of Valsalva Measurements:  Non-coronary:  33 mm  Right -coronary:  32 mm  Left -coronary:  32 mm  Coronary Artery Height above Annulus:  Left Main:  11.9 mm  Right Coronary:  11 mm  Virtual Basal Annulus Measurements:  Maximum/Minimum Diameter:  27 mm x 31.6 mm  Perimeter:  93 mm  Area:  652 mm2  Coronary Arteries:  Sufficient height above annulus for deployment  Optimum Fluoroscopic Angle for Delivery: LAO 11 degrees Caudal 10 degrees  IMPRESSION: 1) Severely calcified tri-leaflet aortic valve with annulus suitable for a 29 mm Sapien 3 valve  2) Coronary arteries sufficient height above annulus for deployment  3) Optimum angle for deployment LAO 11 CAU 10 degrees  4) No LAA thrombus  5) No Pericardial Effusion  6) MAC  7) Normal aortic root 3.4 cm  Jenkins Rouge   Electronically Signed   By: Jenkins Rouge M.D.   On: 02/16/2017 18:16   Addended by Josue Hector, MD on 02/16/2017 6:18 PM    Study Result   EXAM: OVER-READ INTERPRETATION  CT CHEST  The following report is an over-read performed by radiologist Dr. Rebekah [facility] on 02/16/2017. This over-read does not include interpretation of cardiac or coronary anatomy or pathology. The coronary calcium score/coronary CTA interpretation by the cardiologist is attached.  COMPARISON:  None.  FINDINGS: Extracardiac findings will be described under separate dictation for contemporaneously obtained CTA of the chest, abdomen and pelvis.  IMPRESSION: Please see separate dictation for contemporaneously obtained CTA of the chest, abdomen and pelvis dated 02/16/2017 for full description of extracardiac findings.  Electronically Signed: By: Vinnie Langton M.D. On: 02/16/2017 11:23  CT Angio Abd/Pel w/ and/or w/o (Accession [id number]) (Order [id number])  Imaging  Date: 02/16/2017 Department: [facility] CT IMAGING Released By: Reggy Eye Authorizing: Burnell Blanks, MD  Exam Information   Status Exam Begun  Exam Ended   Final [99] 02/16/2017 10:11 AM 02/16/2017 11:07 AM  PACS Images   Show images for CT Angio Abd/Pel w/ and/or w/o  Study Result   CLINICAL DATA:  83 year old male with history of severe aortic stenosis. Preprocedural study prior to potential transcatheter aortic valve replacement (TAVR) procedure.  EXAM: CT ANGIOGRAPHY CHEST, ABDOMEN AND PELVIS  TECHNIQUE: Multidetector CT imaging through the chest, abdomen and pelvis was performed using the standard protocol during bolus administration of intravenous contrast. Multiplanar reconstructed images and MIPs were obtained and reviewed to evaluate the vascular anatomy.  CONTRAST:  70 mL of Isovue 370.  COMPARISON:  No  priors.  FINDINGS: CTA CHEST FINDINGS  Cardiovascular: Heart size is mildly enlarged with left ventricular dilatation. There is no significant pericardial fluid, thickening or pericardial calcification. There is aortic atherosclerosis, as well as atherosclerosis of the great vessels of the mediastinum and the coronary arteries, including calcified atherosclerotic plaque in the left main, left anterior descending, left circumflex and right coronary arteries. Severe thickening calcification of the aortic valve. Mild calcification of the mitral annulus.  Mediastinum/Lymph Nodes: No pathologically enlarged mediastinal or hilar lymph nodes. Esophagus is unremarkable in appearance. No axillary lymphadenopathy.  Lungs/Pleura: A few scattered tiny pulmonary nodules are noted in the lungs bilaterally measuring up to 5 mm (axial image 133 of series 6) in the left lower lobe. No larger more suspicious appearing pulmonary nodules or masses are noted. Mild linear scarring in the periphery of the right upper lobe. No acute consolidative airspace disease. No pleural effusions.  Musculoskeletal/Soft Tissues: There are no aggressive appearing lytic or blastic lesions noted in the visualized portions of the skeleton.  CTA ABDOMEN AND PELVIS FINDINGS  Hepatobiliary: No cystic or solid hepatic lesions. No intra or extrahepatic biliary ductal dilatation. Multiple small calcified gallstones lie dependently in the gallbladder. No findings to suggest acute cholecystitis noted at this time.  Pancreas: No pancreatic mass. No pancreatic ductal dilatation. No pancreatic or peripancreatic fluid or inflammatory changes.  Spleen: Unremarkable.  Adrenals/Urinary Tract: Exophytic 3.4 cm low-attenuation nonenhancing lesion in the lower pole of the left kidney is compatible with a simple cyst. Right kidney and bilateral adrenal glands are normal in appearance. There is no  hydroureteronephrosis. Urinary bladder is normal in appearance.  Stomach/Bowel: Normal appearance of the stomach. No pathologic dilatation of small bowel or colon. Status post appendectomy.  Vascular/Lymphatic: Aortic atherosclerosis, without evidence of aneurysm or dissection in the abdominal or pelvic vasculature. Vascular findings and measurements pertinent to potential TAVR procedure, as detailed below. Celiac axis, superior mesenteric artery and inferior mesenteric artery are all patent without definite hemodynamically significant stenosis. Single right and 2 left renal arteries are patent. No lymphadenopathy noted in the abdomen or pelvis.  Reproductive: Prostate gland and seminal vesicles are unremarkable in appearance.  Other: No significant volume of ascites.  No pneumoperitoneum.  Musculoskeletal: There are no aggressive appearing lytic or blastic lesions noted in the visualized portions of the skeleton.  VASCULAR MEASUREMENTS PERTINENT TO TAVR:  AORTA:  Minimal Aortic Diameter -  15 x 14 mm  Severity of Aortic Calcification -  severe  RIGHT PELVIS:  Right Common Iliac Artery -  Minimal Diameter - 9.9 x 5.3 mm  Tortuosity - moderate  Calcification -  severe  Right External Iliac Artery -  Minimal Diameter - 8.8 x 8.3 mm  Tortuosity - moderate  Calcification - mild  Right Common Femoral Artery -  Minimal Diameter - 9.5 x 7.0 mm  Tortuosity - mild  Calcification - moderate  LEFT PELVIS:  Left Common Iliac Artery -  Minimal Diameter - 7.9 x 7.7 mm  Tortuosity - mild  Calcification - severe  Left External Iliac Artery -  Minimal Diameter - 7.7 x 8.8 mm  Tortuosity - severe  Calcification - mild  Left Common Femoral Artery -  Minimal Diameter - 8.5 x 7.8 mm  Tortuosity - mild  Calcification - mild  Review of the MIP images confirms the above findings.  IMPRESSION: 1. Vascular findings and  measurements pertinent to potential TAVR procedure, as detailed above. This patient does appear to have suitable pelvic arterial access bilaterally. However, given the extensive calcification and asymmetric lumen of the right common iliac artery, vascular access may be more preferable on the left side (despite the greater tortuosity of the left external iliac artery). 2. Severe thickening calcification of the aortic valve, compatible with the reported clinical history of severe aortic stenosis. 3. Aortic atherosclerosis, in addition to left main and 3 vessel coronary artery disease. Please note that although the presence of coronary artery calcium documents the presence of coronary artery disease, the severity of this disease and any potential stenosis cannot be assessed on this non-gated CT examination. Assessment for potential risk factor modification, dietary therapy or pharmacologic therapy may be warranted, if clinically indicated. 4. Cardiomegaly with left ventricular dilatation. 5. Multiple tiny pulmonary nodules scattered throughout the lung bases measuring 5 mm or less in size. These are nonspecific, but statistically likely benign. No follow-up needed if patient is low-risk (and has no known or suspected primary neoplasm). Non-contrast chest CT can be considered in 12 months if patient is high-risk. This recommendation follows the consensus statement: Guidelines for Management of Incidental Pulmonary Nodules Detected on CT Images: From the Fleischner Society 2017; Radiology 2017; 284:228-243. 6. Cholelithiasis without evidence of acute cholecystitis at this time. 7. Additional incidental findings, as above.   Electronically Signed   By: Vinnie Langton M.D.   On: 02/16/2017 12:32    I mpression:  This gentleman has stage D severe, symptomatic aortic stenosis with low flow, low gradient and normal LVEF. He has NYHA class II symptoms of progressive exertional  shortness of breath and fatigue consistent with chronic diastolic heart failure. I have personally reviewed his echo, cath and CTA studies. Echo shows a trileaflet aortic valve with severe thickening, calcification and restricted leaflet mobility with a gradient of 58 mm Hg with peak stress. His cath shows moderate 3-vessel CAD with a 90% left PDA stenosis but he has not had any angina and I don't think this requires revascularization at this time. I think AVR is indicated in this symptomatic patient who is being limited by symptoms with severe AS. His risk for open surgical AVR and CABG would be moderately elevated due to his age, comorbid medical conditions and reduced mobility. I think TAVR is the best treatment for him. His cardiac CT shows anatomy favorable for TAVR using a 29 mm Sapien 3 valve and his pelvic arterial anatomy is suitable for transfemoral insertion.  The patient and his wife were counseled at length regarding treatment alternatives for management of severe symptomatic aortic stenosis. The risks and benefits of surgical intervention has been discussed in detail. Long-term prognosis with medical  therapy was discussed. Alternative approaches such as conventional surgical aortic valve replacement, transcatheter aortic valve replacement, and palliative medical therapy were compared and contrasted at length. This discussion was placed in the context of the patient's own specific clinical presentation and past medical history. All of their questions been addressed. The patient is eager to proceed with surgical management as soon as possible.  Following the decision to proceed with transcatheter aortic valve replacement, a discussion was held regarding what types of management strategies would be attempted intraoperatively in the event of life-threatening complications, including whether or not the patient would be considered a candidate for the use of cardiopulmonary bypass and/or conversion to open  sternotomy for attempted surgical intervention.   The patient has been advised of a variety of complications that might develop including but not limited to risks of death, stroke, paravalvular leak, aortic dissection or other major vascular complications, aortic annulus rupture, device embolization, cardiac rupture or perforation, mitral regurgitation, acute myocardial infarction, arrhythmia, heart block or bradycardia requiring permanent pacemaker placement, congestive heart failure, respiratory failure, renal failure, pneumonia, infection, other late complications related to structural valve deterioration or migration, or other complications that might ultimately cause a temporary or permanent loss of functional independence or other long term morbidity. The patient provides full informed consent for the procedure as described and all questions were answered.    Plan:  He is scheduled for transfemoral TAVR on 03/15/2017 by Dr. Roxy Manns and Dr. Angelena Form. He will discontinue his metformin 48 hrs prior to surgery.   I spent 45 minutes performing this consultation and > 50% of this time was spent face to face counseling and coordinating the care of this patient's severe aortic stenosis.    Gaye Pollack, MD 03/10/2017

## 2017-03-15 NOTE — Progress Notes (Signed)
TCTS BRIEF SICU PROGRESS NOTE  Day of Surgery  S/P Procedure(s) (LRB): TRANSCATHETER AORTIC VALVE REPLACEMENT, TRANSFEMORAL (N/A) TRANSESOPHAGEAL ECHOCARDIOGRAM (TEE) (N/A)   Looks good.  Reports some SOB Sinus brady under pacer w/ HR 50 - VVI pacing BP stable Both groins look good  Plan: Continue current plan.  Leave pacer on for now  Rexene Alberts, MD 03/15/2017 7:15 PM

## 2017-03-15 NOTE — Anesthesia Procedure Notes (Signed)
Central Venous Catheter Insertion Performed by: Roberts Gaudy, anesthesiologist Start/End7/06/2017 12:14 PM, 03/15/2017 12:20 PM Patient location: Pre-op. Preanesthetic checklist: patient identified, IV checked, site marked, risks and benefits discussed, surgical consent, monitors and equipment checked, pre-op evaluation and timeout performed Position: supine Lidocaine 1% used for infiltration Hand hygiene performed , maximum sterile barriers used  and Seldinger technique used Catheter size: 8 Fr Central line was placed.Double lumen Procedure performed using ultrasound guided technique. Ultrasound Notes:anatomy identified, needle tip was noted to be adjacent to the nerve/plexus identified and no ultrasound evidence of intravascular and/or intraneural injection Attempts: 1 Following insertion, line sutured, dressing applied and Biopatch. Post procedure assessment: blood return through all ports, free fluid flow and no air  Patient tolerated the procedure well with no immediate complications.

## 2017-03-15 NOTE — Transfer of Care (Signed)
Immediate Anesthesia Transfer of Care Note  Patient: Todd Romero  Procedure(s) Performed: Procedure(s): TRANSCATHETER AORTIC VALVE REPLACEMENT, TRANSFEMORAL (N/A) TRANSESOPHAGEAL ECHOCARDIOGRAM (TEE) (N/A)  Patient Location: PACU and SICU  Anesthesia Type:MAC  Level of Consciousness: drowsy  Airway & Oxygen Therapy: Patient Spontanous Breathing and Patient connected to nasal cannula oxygen  Post-op Assessment: Report given to RN and Post -op Vital signs reviewed and stable  Post vital signs: Reviewed and stable  Last Vitals:  Vitals:   03/15/17 1154 03/15/17 1307  BP: (!) 156/54 (!) 156/54  Pulse: 67   Resp: 16   Temp: 36.9 C     Last Pain:  Vitals:   03/15/17 1154  TempSrc: Oral         Complications: No apparent anesthesia complications

## 2017-03-15 NOTE — Interval H&P Note (Signed)
History and Physical Interval Note:  03/15/2017 12:40 PM  Todd Romero  has presented today for surgery, with the diagnosis of SEVERE AS  The various methods of treatment have been discussed with the patient and family. After consideration of risks, benefits and other options for treatment, the patient has consented to  Procedure(s): TRANSCATHETER AORTIC VALVE REPLACEMENT, TRANSFEMORAL (N/A) TRANSESOPHAGEAL ECHOCARDIOGRAM (TEE) (N/A) as a surgical intervention .  The patient's history has been reviewed, patient examined, no change in status, stable for surgery.  I have reviewed the patient's chart and labs.  Questions were answered to the patient's satisfaction.     Lauree Chandler

## 2017-03-16 ENCOUNTER — Inpatient Hospital Stay (HOSPITAL_COMMUNITY): Payer: Medicare Other

## 2017-03-16 DIAGNOSIS — I350 Nonrheumatic aortic (valve) stenosis: Principal | ICD-10-CM

## 2017-03-16 DIAGNOSIS — Z952 Presence of prosthetic heart valve: Secondary | ICD-10-CM

## 2017-03-16 DIAGNOSIS — R001 Bradycardia, unspecified: Secondary | ICD-10-CM

## 2017-03-16 DIAGNOSIS — Z954 Presence of other heart-valve replacement: Secondary | ICD-10-CM

## 2017-03-16 DIAGNOSIS — I361 Nonrheumatic tricuspid (valve) insufficiency: Secondary | ICD-10-CM

## 2017-03-16 LAB — GLUCOSE, CAPILLARY
GLUCOSE-CAPILLARY: 137 mg/dL — AB (ref 65–99)
GLUCOSE-CAPILLARY: 201 mg/dL — AB (ref 65–99)
GLUCOSE-CAPILLARY: 236 mg/dL — AB (ref 65–99)
GLUCOSE-CAPILLARY: 186 mg/dL — AB (ref 65–99)
Glucose-Capillary: 154 mg/dL — ABNORMAL HIGH (ref 65–99)
Glucose-Capillary: 175 mg/dL — ABNORMAL HIGH (ref 65–99)

## 2017-03-16 LAB — BASIC METABOLIC PANEL
ANION GAP: 6 (ref 5–15)
BUN: 22 mg/dL — ABNORMAL HIGH (ref 6–20)
CHLORIDE: 109 mmol/L (ref 101–111)
CO2: 21 mmol/L — AB (ref 22–32)
CREATININE: 1 mg/dL (ref 0.61–1.24)
Calcium: 8.4 mg/dL — ABNORMAL LOW (ref 8.9–10.3)
GFR calc non Af Amer: >60 mL/min (ref >60)
Glucose, Bld: 140 mg/dL — ABNORMAL HIGH (ref 65–99)
POTASSIUM: 4.3 mmol/L (ref 3.5–5.1)
Sodium: 136 mmol/L (ref 135–145)

## 2017-03-16 LAB — CBC
HEMATOCRIT: 31.9 % — AB (ref 39.0–52.0)
HEMOGLOBIN: 10.5 g/dL — AB (ref 13.0–17.0)
MCH: 30.1 pg (ref 26.0–34.0)
MCHC: 32.9 g/dL (ref 30.0–36.0)
MCV: 91.4 fL (ref 78.0–100.0)
Platelets: 134 10*3/uL — ABNORMAL LOW (ref 150–400)
RBC: 3.49 MIL/uL — ABNORMAL LOW (ref 4.22–5.81)
RDW: 14.3 % (ref 11.5–15.5)
WBC: 9.1 10*3/uL (ref 4.0–10.5)

## 2017-03-16 LAB — MAGNESIUM: Magnesium: 2 mg/dL (ref 1.7–2.4)

## 2017-03-16 LAB — ECHOCARDIOGRAM COMPLETE
HEIGHTINCHES: 69 in
WEIGHTICAEL: 3840 [oz_av]

## 2017-03-16 MED ORDER — FUROSEMIDE 40 MG PO TABS
40.0000 mg | ORAL_TABLET | Freq: Every day | ORAL | Status: DC
Start: 2017-03-16 — End: 2017-03-17
  Administered 2017-03-16: 40 mg via ORAL
  Filled 2017-03-16: qty 1

## 2017-03-16 MED ORDER — FERROUS SULFATE 325 (65 FE) MG PO TABS
325.0000 mg | ORAL_TABLET | Freq: Every day | ORAL | Status: DC
  Administered 2017-03-16 – 2017-03-17 (×2): 325 mg via ORAL
  Filled 2017-03-16 (×2): qty 1

## 2017-03-16 MED ORDER — SPIRONOLACTONE 25 MG PO TABS
25.0000 mg | ORAL_TABLET | Freq: Every day | ORAL | Status: DC
  Administered 2017-03-16: 25 mg via ORAL
  Filled 2017-03-16: qty 1

## 2017-03-16 MED ORDER — AMLODIPINE BESYLATE 5 MG PO TABS
5.0000 mg | ORAL_TABLET | Freq: Every day | ORAL | Status: DC
  Administered 2017-03-16 – 2017-03-17 (×2): 5 mg via ORAL
  Filled 2017-03-16 (×2): qty 1

## 2017-03-16 MED ORDER — ATROPINE SULFATE 1 MG/10ML IJ SOSY
PREFILLED_SYRINGE | INTRAMUSCULAR | Status: AC
  Filled 2017-03-16: qty 10

## 2017-03-16 MED ORDER — INSULIN ASPART 100 UNIT/ML ~~LOC~~ SOLN
0.0000 [IU] | Freq: Three times a day (TID) | SUBCUTANEOUS | Status: DC
  Administered 2017-03-16: 3 [IU] via SUBCUTANEOUS
  Administered 2017-03-16: 5 [IU] via SUBCUTANEOUS
  Administered 2017-03-17: 3 [IU] via SUBCUTANEOUS

## 2017-03-16 MED FILL — Sodium Chloride IV Soln 0.9%: INTRAVENOUS | Qty: 100 | Status: AC

## 2017-03-16 MED FILL — Sodium Chloride IV Soln 0.9%: INTRAVENOUS | Qty: 250 | Status: AC

## 2017-03-16 MED FILL — Insulin Regular (Human) Inj 100 Unit/ML: INTRAMUSCULAR | Qty: 1 | Status: AC

## 2017-03-16 MED FILL — Phenylephrine HCl Inj 10 MG/ML: INTRAMUSCULAR | Qty: 2 | Status: AC

## 2017-03-16 NOTE — Progress Notes (Signed)
Right femoral venous sheath removed; VSS throughout pull.  Pressure held for 20 minutes until hemostasis achieved.  Right groin site level 0 and bilateral pedal pulses equal.  Patient tolerated removal well and post-sheath removal instructions reviewed with patient. [place], Ardeth Sportsman

## 2017-03-16 NOTE — Discharge Summary (Signed)
Discharge Summary    Patient ID: Todd Romero,  MRN: [id number], DOB/AGE: 12-14-1933 83 y.o.  Admit date: 03/15/2017 Discharge date: 03/17/2017  Primary Care Provider: [name] Primary Cardiologist: Tamala Julian   Discharge Diagnoses    Principal Problem:   S/P TAVR (transcatheter aortic valve replacement) Active Problems:   RBBB   CAD (coronary artery disease)   Essential hypertension, benign   Obstructive sleep apnea   Dyspnea on exertion   Obesity (BMI 30-39.9)   Chronic diastolic heart failure (HCC)   Aortic stenosis   Allergies Allergies  Allergen Reactions  . Morphine And Related Nausea And Vomiting    Diagnostic Studies/Procedures    TAVR: 03/15/17  Procedure:        Transcatheter Aortic Valve Replacement - Percutaneous Left Transfemoral Approach             Edwards Sapien 3 THV (size 29 mm, model # 9600TFX, serial # [id number])              Co-Surgeons:                        Lauree Chandler, MD and Valentina Gu. Roxy Manns, MD   Anesthesiologist:                  Roberts Gaudy, MD  Echocardiographer:              Jenkins Rouge, MD  Pre-operative Echo Findings: ? Severe aortic stenosis ? Normal left ventricular systolic function  Post-operative Echo Findings: ? No paravalvular leak ? Normal left ventricular systolic function  TTE: 2/50/53  Study Conclusions  - Left ventricle: The cavity size was normal. Wall thickness was   increased in a pattern of mild LVH. The estimated ejection   fraction was 55%. Left ventricular diastolic function parameters   were normal. - Aortic valve: Post implant 29 mm Sapien 3 valve with very trivial   peri valvular regurgitation at base of anterior mitral leaflet   Valve area (VTI): 1.76 cm^2. Valve area (Vmax): 1.64 cm^2. Valve   area (Vmean): 1.75 cm^2. - Mitral valve: Calcified annulus. Valve area by pressure   half-time: 2.39 cm^2. - Atrial septum: No defect or patent foramen ovale was identified. -  Pulmonary arteries: PA peak pressure: 40 mm Hg (S). _____________   History of Present Illness     83 year old male with history of aortic stenosis, coronary artery disease, chronic diastolic congestive heart failure, hypertension, obesity, obstructive sleep apnea, right bundle branch block, insulin-dependent type 2 diabetes mellitus, and degenerative arthritis with somewhat limited mobility who has been referred for surgical consultation to discuss treatment options for management of severe symptomatic aortic stenosis. The patient was referred to Dr. Tamala Julian for cardiology consultation several years ago because of right bundle branch block with abnormal EKG. He underwent echocardiogram and diagnostic cardiac catheterization and was found to have mild aortic stenosis and nonobstructive coronary artery disease. He has been followed regularly ever since in the severity of aortic stenosis has gradually progressed. Transthoracic echocardiogram performed in 2017 revealed normal left ventricular systolic function with ejection fraction estimated 55-60% and moderate aortic stenosis with peak velocity across the aortic valve measured 3.65 m/s corresponding to mean transvalvular gradient estimated 29 mmHg. This was unchanged in comparison with previous echocardiogram in 2015. Over the past year the patient has developed progressive symptoms of exertional shortness of breath and fatigue. Follow-up transthoracic echocardiogram performed 10/01/2016 revealed similar findings with peak velocity across  the aortic valve measured 3.5 m/s corresponding to mean transvalvular gradient estimated 28 mmHg. Left ventricular systolic function remained normal with ejection fraction estimated 55-60%. Diagnostic cardiac catheterization was performed 12/28/2016. Catheterization revealed moderate nonobstructive coronary artery disease with 100% stenosis of a small second diagonal branch, 30-50% stenosis in the left anterior descending  coronary artery, eccentric 50-90% proximal stenosis at the origin of left posterior descending coronary artery, and otherwise nonobstructive coronary artery disease. Mean transvalvular gradient measured at catheterization was 29 mmHg corresponding to aortic valve area calculated 1.35 cm. The patient was referred to the multidisciplinary heart valve clinic and evaluated by Dr. Angelena Form. The patient subsequently underwent dobutamine stress echocardiography, confirming the presence of paradoxical low flow normal ejection fraction severe aortic stenosis with increasing mean transvalvular gradient from 36 mmHg at baseline to 58 mmHg with dobutamine stress her the patient was subsequently referred for surgical consultation.   Hospital Course     Consultants: CVTS   Underwent successful TAVR with Dr. Angelena Form and Dr. Roxy Manns on 03/15/17 with Berniece Pap 3 THV (size 22mm) on 03/15/17. Felt well post op. Follow up labs showed Cr 1.0 and Hgb 10.5. He was transferred to telemetry on 03/16/17. Worked well with cardiac rehab. Follow up echo showed stable valve with trivial peri valvular regurgitation at the base of anterior mitral leaflet.   He was seen by Dr. Angelena Form and determined stable for discharge home. Follow up in the office has been arranged. Medications are listed below.  _____________  Discharge Vitals Blood pressure (!) 137/59, pulse 64, temperature 98.3 F (36.8 C), temperature source Oral, resp. rate 20, height 5\' 9"  (1.753 m), weight 236 lb 5.3 oz (107.2 kg), SpO2 95 %.  Filed Weights   03/15/17 1140 03/16/17 0600 03/17/17 0609  Weight: 232 lb (105.2 kg) 240 lb (108.9 kg) 236 lb 5.3 oz (107.2 kg)    Labs & Radiologic Studies    CBC  Recent Labs  03/16/17 0405 03/17/17 0604  WBC 9.1 9.5  HGB 10.5* 10.0*  HCT 31.9* 30.7*  MCV 91.4 91.9  PLT 134* 081*   Basic Metabolic Panel  Recent Labs  03/16/17 0405 03/17/17 0604  NA 136 135  K 4.3 4.2  CL 109 107  CO2 21* 21*  GLUCOSE  140* 174*  BUN 22* 27*  CREATININE 1.00 1.11  CALCIUM 8.4* 8.3*  MG 2.0  --    Liver Function Tests No results for input(s): AST, ALT, ALKPHOS, BILITOT, PROT, ALBUMIN in the last 72 hours. No results for input(s): LIPASE, AMYLASE in the last 72 hours. Cardiac Enzymes No results for input(s): CKTOTAL, CKMB, CKMBINDEX, TROPONINI in the last 72 hours. BNP Invalid input(s): POCBNP D-Dimer No results for input(s): DDIMER in the last 72 hours. Hemoglobin A1C No results for input(s): HGBA1C in the last 72 hours. Fasting Lipid Panel No results for input(s): CHOL, HDL, LDLCALC, TRIG, CHOLHDL, LDLDIRECT in the last 72 hours. Thyroid Function Tests No results for input(s): TSH, T4TOTAL, T3FREE, THYROIDAB in the last 72 hours.  Invalid input(s): FREET3 _____________   Dg Chest Port 1 View  Result Date: 03/16/2017 CLINICAL DATA:  Status post aortic valve replacement. EXAM: PORTABLE CHEST 1 VIEW COMPARISON:  Radiographs of March 15, 2017. FINDINGS: Stable cardiomediastinal silhouette. No pneumothorax or pleural effusion is noted. Atherosclerosis of thoracic aorta is noted. Right internal jugular catheter is unchanged in position. No consolidative process is noted. Bony thorax is unremarkable. Aortic valve prosthesis is noted. IMPRESSION: Aortic atherosclerosis.  No acute cardiopulmonary abnormality seen.  Electronically Signed   By: Marijo Conception, M.D.   On: 03/16/2017 08:40   Dg Chest Port 1 View  Result Date: 03/15/2017 CLINICAL DATA:  Atelectasis S/p TAVAR EXAM: PORTABLE CHEST 1 VIEW COMPARISON:  Chest x-ray dated 03/11/2017. FINDINGS: New valvular hardware in place. Right IJ central line with tip adequately positioned at the level of the lower SVC/ cavoatrial junction. Presumed mediastinal drain overlying the lower mediastinum. Atherosclerosis of the aortic arch. Lungs are clear.  No pleural effusion or pneumothorax seen. IMPRESSION: 1. Status post valvular hardware placement. No evidence of  surgical complicating feature. 2. No pulmonary edema, pleural effusion or pneumothorax. 3. Aortic atherosclerosis. Electronically Signed   By: Franki Cabot M.D.   On: 03/15/2017 17:40    Disposition   Pt is being discharged home today in good condition.  Follow-up Plans & Appointments    Follow-up Information    Charlie Pitter, PA-C Follow up on 03/30/2017.   Specialties:  Cardiology, Radiology Why:  at 11am for your follow up appt.  Contact information: [address] [place] [address] [phone]          Discharge Instructions    Call MD for:  redness, tenderness, or signs of infection (pain, swelling, redness, odor or green/yellow discharge around incision site)    Complete by:  As directed    Diet - low sodium heart healthy    Complete by:  As directed    Discharge instructions    Complete by:  As directed    ACTIVITY AND EXERCISE . Daily activity and exercise are an important part of your recovery. People recover at different rates depending on their general health and type of valve procedure. . Most people require six to 10 weeks to feel recovered. . No lifting, pushing, pulling more than 10 pounds (examples to avoid: groceries, vacuuming, gardening, golfing):  - For one week with a procedure through the groin.  - For six weeks for procedures through the chest wall.  - For three months for procedures through the breast-bone. NOTE: You will typically see one of our providers 7-10 days after your procedure to discuss [place] the above activities.    DRIVING . Do not drive for four weeks after the date of your procedure. . If you have been told by your doctor in the past that you may not drive, you must talk with him/her before you begin driving again. . When you resume driving, you must have someone with you.   DRESSING . Groin site: you may leave the clear dressing over the site for up to one week or until it falls  off.   HYGIENE . If you had a femoral (leg) procedure, you may take a shower when you return home. After the shower, pat the site dry. Do NOT use powder, oils or lotions in your groin area until the site has completely healed. . If you had a chest procedure, you may shower when you return home unless specifically instructed not to by your discharging practitioner.  - DO NOT scrub incision; pat dry with a towel  - DO NOT apply any lotions, oils, powders to the incision  - No tub baths / swimming for at least six weeks. . If you notice any fevers, chills, increased pain, swelling, bleeding or pus, please contact your doctor.  ADDITIONAL INFORMATION . If you are going to have an upcoming dental procedure, please contact our office as you may require antibiotics  ahead of time to prevent infection on your heart valve.   Increase activity slowly    Complete by:  As directed       Discharge Medications   Current Discharge Medication List    START taking these medications   Details  clopidogrel (PLAVIX) 75 MG tablet Take 1 tablet (75 mg total) by mouth daily with breakfast. Qty: 90 tablet, Refills: 1      CONTINUE these medications which have NOT CHANGED   Details  acetaminophen (TYLENOL) 500 MG tablet Take 500 mg by mouth every 4 (four) hours as needed for moderate pain or headache.    amLODipine (NORVASC) 10 MG tablet Take 5 mg by mouth daily.    aspirin EC 81 MG tablet Take 81 mg by mouth daily.    ferrous sulfate 325 (65 FE) MG tablet Take 325 mg by mouth daily with breakfast.    fish oil-omega-3 fatty acids 1000 MG capsule Take 1 g by mouth daily at 12 noon.     furosemide (LASIX) 40 MG tablet Take 40 mg by mouth daily at 12 noon.     glimepiride (AMARYL) 2 MG tablet Take 0.5 tablets (1 mg total) by mouth daily. Qty: 45 tablet, Refills: 1    insulin lispro (HUMALOG KWIKPEN) 100 UNIT/ML KiwkPen 4-8 units before the main meals of the day Qty: 15 mL, Refills: 1    insulin  NPH Human (HUMULIN N,NOVOLIN N) 100 UNIT/ML injection Inject 0.22 mLs (22 Units total) into the skin at bedtime. Uses Vial Qty: 20 mL, Refills: 1    liraglutide 18 MG/3ML SOPN Inject 0.3 mLs (1.8 mg total) into the skin at bedtime. Qty: 15 pen, Refills: 2    metFORMIN (GLUMETZA) 500 MG (MOD) 24 hr tablet Take 500 mg by mouth 2 (two) times daily with a meal. (morning & noon)    Multiple Vitamin (MULTIVITAMIN WITH MINERALS) TABS tablet Take 1 tablet by mouth daily.    Multiple Vitamins-Minerals (PRESERVISION AREDS 2 PO) Take 1 capsule by mouth 2 (two) times daily.    olmesartan (BENICAR) 40 MG tablet Take 1 tablet (40 mg total) by mouth daily. Qty: 90 tablet, Refills: 3    pravastatin (PRAVACHOL) 80 MG tablet Take 80 mg by mouth every evening.     spironolactone (ALDACTONE) 25 MG tablet Take 25 mg by mouth daily at 6 PM.     tamsulosin (FLOMAX) 0.4 MG CAPS capsule TAKE 1 CAPSULE DAILY Qty: 90 capsule, Refills: 2    ACCU-CHEK AVIVA PLUS test strip USE AS INSTRUCTED TO CHECK BLOOD SUGAR TWICE A DAY Qty: 200 each, Refills: 2    B-D INS SYR ULTRAFINE 1CC/31G 31G X 5/16" 1 ML MISC USE TO INJECT INSULIN DAILY Qty: 100 each, Refills: 3    Insulin Pen Needle (B-D UF III MINI PEN NEEDLES) 31G X 5 MM MISC USE 2 PEN NEEDLE PER DAY WITH HUMALOG AND VICTOZA Qty: 200 each, Refills: 3          Outstanding Labs/Studies   Follow up echo in one month.   Duration of Discharge Encounter   Greater than 30 minutes including physician time.  Signed, Reino Bellis NP-C 03/17/2017, 9:15 AM   I have personally seen and examined this patient. I agree with the assessment and plan as outlined above.  See my full note this am.  Lauree Chandler 03/17/2017 5:23 PM

## 2017-03-16 NOTE — Progress Notes (Signed)
      [address]       [place] [address]             [phone]     CARDIOTHORACIC SURGERY PROGRESS NOTE  1 Day Post-Op  S/P Procedure(s) (LRB): TRANSCATHETER AORTIC VALVE REPLACEMENT, TRANSFEMORAL (N/A) TRANSESOPHAGEAL ECHOCARDIOGRAM (TEE) (N/A)  Subjective: Looks good and feels well.  No pain, SOB  Objective: Vital signs in last 24 hours: Temp:  [98.1 F (36.7 C)-98.7 F (37.1 C)] 98.1 F (36.7 C) (07/11 0000) Pulse Rate:  [66-70] 69 (07/11 0700) Cardiac Rhythm: Normal sinus rhythm (07/11 0400) Resp:  [0-23] 18 (07/11 0700) BP: (100-156)/(54-65) 100/65 (07/11 0300) SpO2:  [91 %-100 %] 99 % (07/11 0700) Arterial Line BP: (113-161)/(44-64) 152/56 (07/11 0700) Weight:  [232 lb (105.2 kg)-240 lb (108.9 kg)] 240 lb (108.9 kg) (07/11 0600)  Physical Exam:  Rhythm:   Sinus w/ HR 60  Breath sounds: clear  Heart sounds:  RRR w/out murmur  Incisions:  Both groins look good  Abdomen:  Soft, non-distended, non-tender  Extremities:  Warm, well-perfused    Intake/Output from previous day: 07/10 0701 - 07/11 0700 In: 3387.4 [I.V.:3387.4] Out: 720 [Urine:720] Intake/Output this shift: No intake/output data recorded.  Lab Results:  Recent Labs  03/15/17 1715 03/16/17 0405  WBC 8.8 9.1  HGB 10.2* 10.5*  HCT 31.2* 31.9*  PLT 142* 134*   BMET:  Recent Labs  03/15/17 1456 03/16/17 0405  NA 140 136  K 4.6 4.3  CL 105 109  CO2  --  21*  GLUCOSE 174* 140*  BUN 27* 22*  CREATININE 1.00 1.00  CALCIUM  --  8.4*    CBG (last 3)   Recent Labs  03/15/17 2327 03/16/17 0347 03/16/17 0744  GLUCAP 202* 137* 154*   PT/INR:   Recent Labs  03/15/17 1715  LABPROT 14.3  INR 1.11    CXR:  Clear, mild bibasilar atelectasis  EKG: Done with pacer on   Assessment/Plan: S/P Procedure(s) (LRB): TRANSCATHETER AORTIC VALVE REPLACEMENT, TRANSFEMORAL (N/A) TRANSESOPHAGEAL ECHOCARDIOGRAM (TEE) (N/A)  Doing well POD1 Maintaining NSR w/ stable BP,  bradycardia appears to have resolved, h/o RBBB preop Expected post op acute blood loss anemia, mild Hypertension Type II diabetes mellitus, excellent glycemic control OSA on CPAP   Repeat EKG w/ pacer off  ASA and Plavix  Restart amlodipine, tamsulosin, spironolactone and lasix at preop doses  Restart olmesartan prior to d/c if BP will allow  Continue SSI and restart home diabetes Rx as intake improves  Routine POD1 ECHO  Transfer tele and anticipate possible d/c home tomorrow  I spent in excess of 15 minutes during the conduct of this hospital encounter and >50% of this time involved direct face-to-face encounter with the patient for counseling and/or coordination of their care.   Rexene Alberts, MD 03/16/2017 8:16 AM

## 2017-03-16 NOTE — Progress Notes (Signed)
Anesthesiology Follow-up:  83 year old male one day S/P TAVR. Awake and alert, in good spirits, neuro intact. Hemodynamically stable, VVI paced at 70 bpm. SR at 63 underneath.  VS: T- 36,9 BP- 126/51 HR- 80 RR- 14 O2 Sat 99% on2L  K- 4.3 BUN/Cr, 22/1,0 glucose- 140 H/H- 10,5/31,9 Platelets- 134,000  Stable overnight, he had hypotension and bradycardia (nodal rhythm 30s)  following TAVR deployment. Has baseline RBB. On transvenous VVI pacer with SR underneath, hopefully pacer can be removed today.  Todd Romero

## 2017-03-16 NOTE — Progress Notes (Signed)
  Echocardiogram 2D Echocardiogram has been performed.  Todd Romero 03/16/2017, 10:21 AM

## 2017-03-16 NOTE — Care Management Note (Signed)
Case Management Note  Patient Details  Name: Todd Romero MRN: [id number] Date of Birth: 1933-09-20  Subjective/Objective:  From home with wife, pod 1 TAVR , he is ambulatory, pta indep,  For echo today, will transfer to tele and for dc tomorrow.  He has a PCP , medication coverage and transportation at discharge.                   Action/Plan: NCM will follow for dc needs.   Expected Discharge Date:                  Expected Discharge Plan:  Home/Self Care  In-House Referral:     Discharge planning Services  CM Consult  Post Acute Care Choice:    Choice offered to:     DME Arranged:    DME Agency:     HH Arranged:    [place] Agency:     Status of Service:  Completed, signed off  If discussed at [employer] of Stay Meetings, dates discussed:    Additional Comments:  Zenon Mayo, RN 03/16/2017, 10:13 AM

## 2017-03-16 NOTE — Progress Notes (Signed)
Progress Note  Patient Name: Todd Romero Date of Encounter: 03/16/2017  Primary Cardiologist: Tamala Julian TAVR Cardiologist: Angelena Form  Subjective   No chest pain or dyspnea.   Inpatient Medications    Scheduled Meds: . aspirin EC  325 mg Oral Daily   Or  . aspirin  324 mg Per Tube Daily  . [START ON 03/17/2017] aspirin EC  81 mg Oral Daily  . chlorhexidine  15 mL Mouth/Throat NOW  . clopidogrel  75 mg Oral Q breakfast  . mouth rinse  15 mL Mouth Rinse BID  . [START ON 03/17/2017] pantoprazole  40 mg Oral Daily  . pravastatin  80 mg Oral QPM  . tamsulosin  0.4 mg Oral Daily   Continuous Infusions: . albumin human    . cefUROXime (ZINACEF)  IV Stopped (03/15/17 2025)  . famotidine (PEPCID) IV Stopped (03/15/17 2141)  . lactated ringers    . nitroGLYCERIN    . phenylephrine (NEO-SYNEPHRINE) Adult infusion     PRN Meds: albumin human, fentaNYL (SUBLIMAZE) injection, lactated ringers, metoprolol tartrate, midazolam, ondansetron (ZOFRAN) IV, oxyCODONE, traMADol   Vital Signs    Vitals:   03/16/17 0400 03/16/17 0500 03/16/17 0600 03/16/17 0700  BP:      Pulse: 69 69 69 69  Resp: (!) 21 18 15 18   Temp:      TempSrc:      SpO2: 96% 97% 99% 99%  Weight:   240 lb (108.9 kg)   Height:        Intake/Output Summary (Last 24 hours) at 03/16/17 0752 Last data filed at 03/16/17 0600  Gross per 24 hour  Intake           3387.4 ml  Output              720 ml  Net           2667.4 ml   Filed Weights   03/15/17 1140 03/16/17 0600  Weight: 232 lb (105.2 kg) 240 lb (108.9 kg)    Telemetry    sinus - Personally Reviewed  ECG     Physical Exam   GEN: No acute distress.   Neck: No JVD Cardiac: RRR, no murmurs, rubs, or gallops.  Respiratory: Clear to auscultation bilaterally. GI: Soft, nontender, non-distended  Ext: No edema. Bilateral groins without hematoma Neuro:  Nonfocal  Psych: Normal affect   Labs    Chemistry  Recent Labs Lab 03/11/17 1351  03/15/17 1402 03/15/17 1456 03/16/17 0405  NA 136 140 140 136  K 4.7 4.5 4.6 4.3  CL 106 106 105 109  CO2 20*  --   --  21*  GLUCOSE 109* 126* 174* 140*  BUN 32* 27* 27* 22*  CREATININE 1.15 0.90 1.00 1.00  CALCIUM 9.7  --   --  8.4*  PROT 7.2  --   --   --   ALBUMIN 4.2  --   --   --   AST 21  --   --   --   ALT 17  --   --   --   ALKPHOS 52  --   --   --   BILITOT 0.8  --   --   --   GFRNONAA 58*  --   --  >60  GFRAA >60  --   --  >60  ANIONGAP 10  --   --  6     Hematology Recent Labs Lab 03/11/17 1351  03/15/17 1456 03/15/17 1715  03/16/17 0405  WBC 7.5  --   --  8.8 9.1  RBC 3.96*  --   --  3.41* 3.49*  HGB 11.9*  < > 10.5* 10.2* 10.5*  HCT 36.8*  < > 31.0* 31.2* 31.9*  MCV 92.9  --   --  91.5 91.4  MCH 30.1  --   --  29.9 30.1  MCHC 32.3  --   --  32.7 32.9  RDW 14.5  --   --  14.0 14.3  PLT 173  --   --  142* 134*  < > = values in this interval not displayed.   Radiology    Dg Chest Port 1 View  Result Date: 03/15/2017 CLINICAL DATA:  Atelectasis S/p TAVAR EXAM: PORTABLE CHEST 1 VIEW COMPARISON:  Chest x-ray dated 03/11/2017. FINDINGS: New valvular hardware in place. Right IJ central line with tip adequately positioned at the level of the lower SVC/ cavoatrial junction. Presumed mediastinal drain overlying the lower mediastinum. Atherosclerosis of the aortic arch. Lungs are clear.  No pleural effusion or pneumothorax seen. IMPRESSION: 1. Status post valvular hardware placement. No evidence of surgical complicating feature. 2. No pulmonary edema, pleural effusion or pneumothorax. 3. Aortic atherosclerosis. Electronically Signed   By: Franki Cabot M.D.   On: 03/15/2017 17:40    Cardiac Studies    Patient Profile     83 y.o. male with history of CAD, HTN, HLD, DM and severe aortic valve stenosis who underwent TAVR from the left transfemoral approach on 03/15/17  Assessment & Plan    1. Severe aortic valve stenosis: He is now one day post TAVR from the left  transfemoral approach with placement of a 29 mm Edwards Sapien 3 bioprosthetic aortic valve replacement. He is doing well this am. He is on ASA. Will start Plavix today. Echo later today to assess valve.   2. Bradycardia: He is sinus this am with HR of 65 bpm. Pacing wire removed.   3. DM: start SSI  Remove central line neck and groin. Transfer to telemetry today.   Signed, Lauree Chandler, MD  03/16/2017, 7:52 AM

## 2017-03-17 ENCOUNTER — Encounter (HOSPITAL_COMMUNITY): Payer: Self-pay | Admitting: Cardiovascular Disease

## 2017-03-17 ENCOUNTER — Other Ambulatory Visit: Payer: Medicare Other

## 2017-03-17 DIAGNOSIS — I10 Essential (primary) hypertension: Secondary | ICD-10-CM

## 2017-03-17 LAB — BASIC METABOLIC PANEL
ANION GAP: 7 (ref 5–15)
BUN: 27 mg/dL — AB (ref 6–20)
CHLORIDE: 107 mmol/L (ref 101–111)
CO2: 21 mmol/L — ABNORMAL LOW (ref 22–32)
Calcium: 8.3 mg/dL — ABNORMAL LOW (ref 8.9–10.3)
Creatinine, Ser: 1.11 mg/dL (ref 0.61–1.24)
GFR calc Af Amer: >60 mL/min (ref >60)
GFR calc non Af Amer: >60 mL/min (ref >60)
GLUCOSE: 174 mg/dL — AB (ref 65–99)
POTASSIUM: 4.2 mmol/L (ref 3.5–5.1)
SODIUM: 135 mmol/L (ref 135–145)

## 2017-03-17 LAB — CBC
HEMATOCRIT: 30.7 % — AB (ref 39.0–52.0)
HEMOGLOBIN: 10 g/dL — AB (ref 13.0–17.0)
MCH: 29.9 pg (ref 26.0–34.0)
MCHC: 32.6 g/dL (ref 30.0–36.0)
MCV: 91.9 fL (ref 78.0–100.0)
Platelets: 107 10*3/uL — ABNORMAL LOW (ref 150–400)
RBC: 3.34 MIL/uL — ABNORMAL LOW (ref 4.22–5.81)
RDW: 14.4 % (ref 11.5–15.5)
WBC: 9.5 10*3/uL (ref 4.0–10.5)

## 2017-03-17 LAB — GLUCOSE, CAPILLARY: Glucose-Capillary: 188 mg/dL — ABNORMAL HIGH (ref 65–99)

## 2017-03-17 MED ORDER — CLOPIDOGREL BISULFATE 75 MG PO TABS: 75.0000 mg | ORAL_TABLET | Freq: Every day | ORAL | 1 refills | Status: DC

## 2017-03-17 NOTE — Progress Notes (Signed)
[address]       [place] [address]             [phone]        CARDIOTHORACIC SURGERY PROGRESS NOTE   R2 Days Post-Op Procedure(s) (LRB): TRANSCATHETER AORTIC VALVE REPLACEMENT, TRANSFEMORAL (N/A) TRANSESOPHAGEAL ECHOCARDIOGRAM (TEE) (N/A)  Subjective: Looks good and feels well.  Wants to go home  Objective: Vital signs: BP Readings from Last 1 Encounters:  03/17/17 (!) 137/59   Pulse Readings from Last 1 Encounters:  03/17/17 64   Resp Readings from Last 1 Encounters:  03/17/17 20   Temp Readings from Last 1 Encounters:  03/17/17 98.3 F (36.8 C) (Oral)    Hemodynamics:    Physical Exam:  Rhythm:   sinus  Breath sounds: clear  Heart sounds:  RRR w/out murmur  Incisions:  Both groins okay  Abdomen:  Soft, non-distended, non-tender  Extremities:  Warm, well-perfused    Intake/Output from previous day: 07/11 0701 - 07/12 0700 In: 600 [P.O.:600] Out: 900 [Urine:900] Intake/Output this shift: Total I/O In: 50 [IV Piggyback:50] Out: -   Lab Results:  CBC: Recent Labs  03/16/17 0405 03/17/17 0604  WBC 9.1 9.5  HGB 10.5* 10.0*  HCT 31.9* 30.7*  PLT 134* PENDING    BMET:  Recent Labs  03/16/17 0405 03/17/17 0604  NA 136 135  K 4.3 4.2  CL 109 107  CO2 21* 21*  GLUCOSE 140* 174*  BUN 22* 27*  CREATININE 1.00 1.11  CALCIUM 8.4* 8.3*     PT/INR:   Recent Labs  03/15/17 1715  LABPROT 14.3  INR 1.11    CBG (last 3)   Recent Labs  03/16/17 1935 03/16/17 2120 03/17/17 0753  GLUCAP 236* 186* 188*    ABG    Component Value Date/Time   PHART 7.440 03/11/2017 1352   PCO2ART 33.3 03/11/2017 1352   PO2ART 77.6 (L) 03/11/2017 1352   HCO3 22.2 03/11/2017 1352   TCO2 25 03/15/2017 1456   ACIDBASEDEF 1.4 03/11/2017 1352   O2SAT 95.5 03/11/2017 1352    Transthoracic Echocardiography  Patient:    Todd, Romero MR #:       [id number] Study Date: 03/16/2017 Gender:     M Age:         83 Height:     175.3 cm Weight:     106.8 kg BSA:        2.32 m^2 Pt. Status: Room:       [facility]   ORDERING     Darylene Price, M.D.  REFERRING    Darylene Price, M.D.  ADMITTING    Lauree Chandler  ATTENDING    [place], [place], Inpatient  SONOGRAPHER  Cardell Peach, RDCS  cc:  ------------------------------------------------------------------- LV EF: 55%  ------------------------------------------------------------------- History:   PMH:  Aortic Valve Disorder.  Dyspnea.  Coronary artery disease.  Congestive heart failure.  Risk factors:  Hypertension. Diabetes mellitus. Dyslipidemia.  ------------------------------------------------------------------- Study Conclusions  - Left ventricle: The cavity size was normal. Wall thickness was   increased in a pattern of mild LVH. The estimated ejection   fraction was 55%. Left ventricular diastolic function parameters   were normal. - Aortic valve: Post implant 29 mm Sapien 3 valve with very trivial   peri valvular regurgitation at base of anterior mitral leaflet   Valve area (VTI): 1.76 cm^2. Valve area (Vmax): 1.64 cm^2. Valve   area (Vmean): 1.75 cm^2. - Mitral valve:  Calcified annulus. Valve area by pressure   half-time: 2.39 cm^2. - Atrial septum: No defect or patent foramen ovale was identified. - Pulmonary arteries: PA peak pressure: 40 mm Hg (S).  ------------------------------------------------------------------- Study data:  Comparison was made to the study of 02/07/2017.  Study status:  Routine.  Procedure:  Transthoracic echocardiography. Image quality was fair. The study was technically difficult, as a result of restricted patient mobility.  Study completion:  There were no complications.          Transthoracic echocardiography. M-mode, complete 2D, spectral Doppler, and color Doppler. Birthdate:  Patient birthdate: 09-11-1933.  Age:  Patient is 83 yr old.  Sex:  Gender: male.     BMI: 34.8 kg/m^2.  Blood pressure: 113/52  Patient status:  Inpatient.  Study date:  Study date: 03/16/2017. Study time: 09:30 AM.  Location:  ICU/CCU  -------------------------------------------------------------------  ------------------------------------------------------------------- Left ventricle:  The cavity size was normal. Wall thickness was increased in a pattern of mild LVH. The estimated ejection fraction was 55%. The transmitral flow pattern was normal. The deceleration time of the early transmitral flow velocity was normal. The pulmonary vein flow pattern was normal. The tissue Doppler parameters were normal. Left ventricular diastolic function parameters were normal.  ------------------------------------------------------------------- Aortic valve:  Post implant 29 mm Sapien 3 valve with very trivial peri valvular regurgitation at base of anterior mitral leaflet Doppler:     VTI ratio of LVOT to aortic valve: 0.56. Valve area (VTI): 1.76 cm^2. Indexed valve area (VTI): 0.76 cm^2/m^2. Peak velocity ratio of LVOT to aortic valve: 0.52. Valve area (Vmax): 1.64 cm^2. Indexed valve area (Vmax): 0.71 cm^2/m^2. Mean velocity ratio of LVOT to aortic valve: 0.56. Valve area (Vmean): 1.75 cm^2. Indexed valve area (Vmean): 0.76 cm^2/m^2.    Mean gradient (S): 9 mm Hg. Peak gradient (S): 23 mm Hg.  ------------------------------------------------------------------- Aorta:  The aorta was normal, not dilated, and non-diseased.  ------------------------------------------------------------------- Mitral valve:   Calcified annulus.  Doppler:  There was trivial regurgitation.    Valve area by pressure half-time: 2.39 cm^2. Indexed valve area by pressure half-time: 1.03 cm^2/m^2.    Peak gradient (D): 7 mm Hg.  ------------------------------------------------------------------- Left atrium:  The atrium was at the upper limits of normal in size.    ------------------------------------------------------------------- Atrial septum:  No defect or patent foramen ovale was identified.   ------------------------------------------------------------------- Right ventricle:  The cavity size was normal. Wall thickness was normal. Systolic function was normal.  ------------------------------------------------------------------- Pulmonic valve:    Structurally normal valve.   Cusp separation was normal.  Doppler:  Transvalvular velocity was within the normal range. There was trivial regurgitation.  ------------------------------------------------------------------- Tricuspid valve:   Doppler:  There was mild regurgitation.  ------------------------------------------------------------------- Right atrium:  The atrium was normal in size.  ------------------------------------------------------------------- Pericardium:  The pericardium was normal in appearance.  ------------------------------------------------------------------- Systemic veins: Inferior vena cava: The vessel was normal in size. The respirophasic diameter changes were in the normal range (>= 50%), consistent with normal central venous pressure.  ------------------------------------------------------------------- Post procedure conclusions Ascending Aorta:  - The aorta was normal, not dilated, and non-diseased.  ------------------------------------------------------------------- Measurements   Left ventricle                           Value          Reference  LV ID, ED, PLAX chordal          (H)     57    mm  43 - 52  LV ID, ES, PLAX chordal          (H)     41    mm       23 - 38  LV fx shortening, PLAX chordal   (L)     28    %        >=29  LV PW thickness, ED                      14    mm       ----------  IVS/LV PW ratio, ED                      0.93           <=1.3  Stroke volume, 2D                        69    ml       ----------  Stroke  volume/bsa, 2D                    30    ml/m^2   ----------  LV e&', lateral                           12.6  cm/s     ----------  LV E/e&', lateral                         10.63          ----------  LV e&', medial                            5.55  cm/s     ----------  LV E/e&', medial                          24.14          ----------  LV e&', average                           9.08  cm/s     ----------  LV E/e&', average                         14.77          ----------    Ventricular septum                       Value          Reference  IVS thickness, ED                        13    mm       ----------    LVOT                                     Value          Reference  LVOT ID, S  20    mm       ----------  LVOT area                                3.14  cm^2     ----------  LVOT peak velocity, S                    124   cm/s     ----------  LVOT mean velocity, S                    75.4  cm/s     ----------  LVOT VTI, S                              22    cm       ----------  LVOT peak gradient, S                    6     mm Hg    ----------    Aortic valve                             Value          Reference  Aortic valve peak velocity, S            238   cm/s     ----------  Aortic valve mean velocity, S            135   cm/s     ----------  Aortic valve VTI, S                      39.2  cm       ----------  Aortic mean gradient, S                  9     mm Hg    ----------  Aortic peak gradient, S                  23    mm Hg    ----------  VTI ratio, LVOT/AV                       0.56           ----------  Aortic valve area, VTI                   1.76  cm^2     ----------  Aortic valve area/bsa, VTI               0.76  cm^2/m^2 ----------  Velocity ratio, peak, LVOT/AV            0.52           ----------  Aortic valve area, peak velocity         1.64  cm^2     ----------  Aortic valve area/bsa, peak              0.71  cm^2/m^2 ----------  velocity   Velocity ratio, mean, LVOT/AV            0.56           ----------  Aortic valve area, mean velocity  1.75  cm^2     ----------  Aortic valve area/bsa, mean              0.76  cm^2/m^2 ----------  velocity    Aorta                                    Value          Reference  Aortic root ID, ED                       23    mm       ----------    Left atrium                              Value          Reference  LA ID, A-P, ES                           37    mm       ----------  LA ID/bsa, A-P                           1.6   cm/m^2   <=2.2  LA volume, S                             86.8  ml       ----------  LA volume/bsa, S                         37.4  ml/m^2   ----------  LA volume, ES, 1-p A4C                   103   ml       ----------  LA volume/bsa, ES, 1-p A4C               44.4  ml/m^2   ----------  LA volume, ES, 1-p A2C                   59.5  ml       ----------  LA volume/bsa, ES, 1-p A2C               25.7  ml/m^2   ----------    Mitral valve                             Value          Reference  Mitral E-wave peak velocity              134   cm/s     ----------  Mitral A-wave peak velocity              134   cm/s     ----------  Mitral deceleration time         (H)     313   ms       150 - 230  Mitral pressure half-time                92    ms       ----------  Mitral peak gradient, D                  7     mm Hg    ----------  Mitral E/A ratio, peak                   1              ----------  Mitral valve area, PHT, DP               2.39  cm^2     ----------  Mitral valve area/bsa, PHT, DP           1.03  cm^2/m^2 ----------    Pulmonary arteries                       Value          Reference  PA pressure, S, DP               (H)     40    mm Hg    <=30    Tricuspid valve                          Value          Reference  Tricuspid regurg peak velocity           284   cm/s     ----------  Tricuspid peak RV-RA gradient            32    mm Hg    ----------    Right  atrium                             Value          Reference  RA ID, S-I, ES, A4C              (H)     61.7  mm       34 - 49  RA area, ES, A4C                 (H)     25    cm^2     8.3 - 19.5  RA volume, ES, A/L                       82.8  ml       ----------  RA volume/bsa, ES, A/L                   35.7  ml/m^2   ----------    Systemic veins                           Value          Reference  Estimated CVP                            8     mm Hg    ----------    Right ventricle                          Value          Reference  RV ID, minor axis, ED, A4C base  45    mm       ----------  TAPSE                                    24.2  mm       ----------  RV pressure, S, DP               (H)     40    mm Hg    <=30  RV s&', lateral, S                        13.8  cm/s     ----------  Legend: (L)  and  (H)  mark values outside specified reference range.  ------------------------------------------------------------------- Prepared and Electronically Authenticated by  Jenkins Rouge, M.D. 2018-07-11T10:57:37  Assessment/Plan: S/P Procedure(s) (LRB): TRANSCATHETER AORTIC VALVE REPLACEMENT, TRANSFEMORAL (N/A) TRANSESOPHAGEAL ECHOCARDIOGRAM (TEE) (N/A)  Doing very well s/p TAVR ECHO looks good Agree w/ plans for d/c home today  Rexene Alberts, MD 03/17/2017 8:43 AM

## 2017-03-17 NOTE — Progress Notes (Signed)
CARDIAC REHAB PHASE I   Pt ambulated with nursing staff, no complaints, ready for discharge. Cardiac surgeyr discharge education completed. Reviewed restrictions, activity progression, exercise, heart healthy and diabetes diet handouts, sodium restrictions, s/s heart failure, daily weights and phase 2 cardiac rehab. Pt verbalized understanding. Pt agrees to phase 2 cardiac rehab referral, will send to [facility] per pt request. Pt in recliner, call bell within reach.   [phone] Lenna Sciara, RN, BSN 03/17/2017 10:31 AM

## 2017-03-17 NOTE — Progress Notes (Signed)
Progress Note  Patient Name: Todd Romero Fill Date of Encounter: 03/17/2017  Primary Cardiologist: Tamala Julian TAVR Cardiologist: Angelena Form  Subjective   No chest pain or dyspnea. No events.   Inpatient Medications    Scheduled Meds: . amLODipine  5 mg Oral Daily  . aspirin EC  81 mg Oral Daily  . clopidogrel  75 mg Oral Q breakfast  . ferrous sulfate  325 mg Oral Q breakfast  . furosemide  40 mg Oral Q1200  . insulin aspart  0-15 Units Subcutaneous TID WC  . pantoprazole  40 mg Oral Daily  . pravastatin  80 mg Oral QPM  . spironolactone  25 mg Oral q1800  . tamsulosin  0.4 mg Oral Daily   Continuous Infusions: . cefUROXime (ZINACEF)  IV Stopped (03/16/17 2036)   PRN Meds: fentaNYL (SUBLIMAZE) injection, metoprolol tartrate, ondansetron (ZOFRAN) IV, oxyCODONE, traMADol   Vital Signs    Vitals:   03/16/17 2300 03/17/17 0000 03/17/17 0400 03/17/17 0609  BP: 118/64 (!) 124/56 120/62   Pulse: 66 60 61   Resp: (!) 21 19 19    Temp: 98.1 F (36.7 C)   98.2 F (36.8 C)  TempSrc: Oral   Oral  SpO2: 97% 97% 94%   Weight:    236 lb 5.3 oz (107.2 kg)  Height:        Intake/Output Summary (Last 24 hours) at 03/17/17 0705 Last data filed at 03/17/17 0600  Gross per 24 hour  Intake              600 ml  Output              900 ml  Net             -300 ml   Filed Weights   03/15/17 1140 03/16/17 0600 03/17/17 0609  Weight: 232 lb (105.2 kg) 240 lb (108.9 kg) 236 lb 5.3 oz (107.2 kg)    Telemetry    NSR with 1st degree AV block and PACs, PVCs - Personally Reviewed  ECG     Physical Exam   General: Well developed, well nourished, NAD  HEENT: OP clear, mucus membranes moist  SKIN: warm, dry. No rashes. Neuro: No focal deficits  Musculoskeletal: Muscle strength 5/5 all ext  Psychiatric: Mood and affect normal  Neck: No JVD, no carotid bruits, no thyromegaly, no lymphadenopathy.  Lungs:Clear bilaterally, no wheezes, rhonci, crackles Cardiovascular: Regular rate  and rhythm. No murmurs, gallops or rubs. Abdomen:Soft. Bowel sounds present. Non-tender.  Extremities: No lower extremity edema. Pulses are 2 + in the bilateral DP/PT.   Labs    Chemistry  Recent Labs Lab 03/11/17 1351  03/15/17 1456 03/16/17 0405 03/17/17 0604  NA 136  < > 140 136 135  K 4.7  < > 4.6 4.3 4.2  CL 106  < > 105 109 107  CO2 20*  --   --  21* 21*  GLUCOSE 109*  < > 174* 140* 174*  BUN 32*  < > 27* 22* 27*  CREATININE 1.15  < > 1.00 1.00 1.11  CALCIUM 9.7  --   --  8.4* 8.3*  PROT 7.2  --   --   --   --   ALBUMIN 4.2  --   --   --   --   AST 21  --   --   --   --   ALT 17  --   --   --   --  ALKPHOS 52  --   --   --   --   BILITOT 0.8  --   --   --   --   GFRNONAA 58*  --   --  >60 >60  GFRAA >60  --   --  >60 >60  ANIONGAP 10  --   --  6 7  < > = values in this interval not displayed.   Hematology Recent Labs Lab 03/11/17 1351  03/15/17 1456 03/15/17 1715 03/16/17 0405  WBC 7.5  --   --  8.8 9.1  RBC 3.96*  --   --  3.41* 3.49*  HGB 11.9*  < > 10.5* 10.2* 10.5*  HCT 36.8*  < > 31.0* 31.2* 31.9*  MCV 92.9  --   --  91.5 91.4  MCH 30.1  --   --  29.9 30.1  MCHC 32.3  --   --  32.7 32.9  RDW 14.5  --   --  14.0 14.3  PLT 173  --   --  142* 134*  < > = values in this interval not displayed.   Radiology    Dg Chest Port 1 View  Result Date: 03/16/2017 CLINICAL DATA:  Status post aortic valve replacement. EXAM: PORTABLE CHEST 1 VIEW COMPARISON:  Radiographs of March 15, 2017. FINDINGS: Stable cardiomediastinal silhouette. No pneumothorax or pleural effusion is noted. Atherosclerosis of thoracic aorta is noted. Right internal jugular catheter is unchanged in position. No consolidative process is noted. Bony thorax is unremarkable. Aortic valve prosthesis is noted. IMPRESSION: Aortic atherosclerosis.  No acute cardiopulmonary abnormality seen. Electronically Signed   By: Marijo Conception, M.D.   On: 03/16/2017 08:40   Dg Chest Port 1 View  Result  Date: 03/15/2017 CLINICAL DATA:  Atelectasis S/p TAVAR EXAM: PORTABLE CHEST 1 VIEW COMPARISON:  Chest x-ray dated 03/11/2017. FINDINGS: New valvular hardware in place. Right IJ central line with tip adequately positioned at the level of the lower SVC/ cavoatrial junction. Presumed mediastinal drain overlying the lower mediastinum. Atherosclerosis of the aortic arch. Lungs are clear.  No pleural effusion or pneumothorax seen. IMPRESSION: 1. Status post valvular hardware placement. No evidence of surgical complicating feature. 2. No pulmonary edema, pleural effusion or pneumothorax. 3. Aortic atherosclerosis. Electronically Signed   By: Franki Cabot M.D.   On: 03/15/2017 17:40    Cardiac Studies   Echo 03/16/17: Left ventricle: The cavity size was normal. Wall thickness was   increased in a pattern of mild LVH. The estimated ejection   fraction was 55%. Left ventricular diastolic function parameters   were normal. - Aortic valve: Post implant 29 mm Sapien 3 valve with very trivial   peri valvular regurgitation at base of anterior mitral leaflet   Valve area (VTI): 1.76 cm^2. Valve area (Vmax): 1.64 cm^2. Valve   area (Vmean): 1.75 cm^2. - Mitral valve: Calcified annulus. Valve area by pressure   half-time: 2.39 cm^2. - Atrial septum: No defect or patent foramen ovale was identified. - Pulmonary arteries: PA peak pressure: 40 mm Hg (S).  Patient Profile     83 y.o. male with history of CAD, HTN, HLD, DM and severe aortic valve stenosis who underwent TAVR from the left transfemoral approach on 03/15/17  Assessment & Plan    1. Severe aortic valve stenosis: He is now two days post TAVR from the left transfemoral approach He is doing well. Echo with normal LV function, normally functioning bioprosthetic aortic valve. Will continue ASA and Plavix.  2. Bradycardia: sinus this am.   3. DM: Resume home regimen at discharge. Will resume metformin tomorrow.   4. HTN: Resume Benicar at home dose.  Not on formulary here. He can take at home tosay. BP stable.   Discharge home today. He will need follow up in our office in one week with office APP and then in one month with me with echo.   Signed, Lauree Chandler, MD  03/17/2017, 7:05 AM

## 2017-03-17 NOTE — Progress Notes (Signed)
Discharge instructions reviewed with patient and his wife at bedside. S/S of infection reviewed. Surgical site care reviewed. Activity/diet and restrictions reviewed. Patient education/Information given regarding new medication- plavix. Prescription sent to patients pharmacy. All questions answered. Patient and wife verbalize understanding of all discharge instructions. VSS. Pt in no acute distress. Volunteer services called and patient discharged via wheelchair. Wife to drive patient home.

## 2017-03-21 ENCOUNTER — Other Ambulatory Visit: Payer: Self-pay

## 2017-03-21 ENCOUNTER — Telehealth: Payer: Self-pay | Admitting: Endocrinology

## 2017-03-21 MED ORDER — INSULIN NPH (HUMAN) (ISOPHANE) 100 UNIT/ML ~~LOC~~ SUSP
24.0000 [IU] | Freq: Every day | SUBCUTANEOUS | 1 refills | Status: DC
Start: 2017-03-21 — End: 2017-08-19

## 2017-03-21 NOTE — Telephone Encounter (Signed)
Prescription to be refilled by CMA

## 2017-03-21 NOTE — Telephone Encounter (Signed)
Patient called to request a renewal of insulin NPH Human (HUMULIN N,NOVOLIN N) 100 UNIT/ML injection [[id number]] to [facility]

## 2017-03-21 NOTE — Telephone Encounter (Signed)
Called patient and was not able to leave a voice message but wanted to let him know that I have sent this refill in to the [facility] for his insulin Humulin N.

## 2017-03-22 ENCOUNTER — Telehealth (HOSPITAL_COMMUNITY): Payer: Self-pay

## 2017-03-22 ENCOUNTER — Encounter: Payer: Medicare Other | Admitting: Endocrinology

## 2017-03-22 NOTE — Telephone Encounter (Signed)
Patient insurance is active and benefits verified. Patient has [facility] Medicare - no co-payment, deductible $183.00/$183.00 has been met, out of pocket $1523.00/$183.00 has been met, no co-insurance, no pre-authorization and no limit on visit. Passport/reference [phone].  Patient will be contacted and scheduled after their follow up appointment with the cardiologist on 03/30/17, upon review by [facility] RN navigator.

## 2017-03-23 ENCOUNTER — Telehealth: Payer: Self-pay | Admitting: Endocrinology

## 2017-03-23 ENCOUNTER — Other Ambulatory Visit: Payer: Self-pay

## 2017-03-23 NOTE — Telephone Encounter (Signed)
**  Remind patient they can make refill requests via MyChart**  Medication refill request (Name & Dosage):  Pravastatin (pravachol) 80 mg  Preferred pharmacy (Name & Address):  [place] [facility] [place]  Other comments (if applicable):

## 2017-03-24 MED ORDER — PRAVASTATIN SODIUM 80 MG PO TABS: 80.0000 mg | ORAL_TABLET | Freq: Every evening | ORAL | 1 refills | Status: DC

## 2017-03-24 NOTE — Telephone Encounter (Signed)
Refill submitted. 

## 2017-03-29 ENCOUNTER — Encounter: Payer: Self-pay | Admitting: Physician Assistant

## 2017-03-29 NOTE — Progress Notes (Signed)
Cardiology Office Note    Date:  03/30/2017  ID:  Todd Romero, DOB [birth year], MRN [id number] PCP:  [name], MD  Cardiologist:  Dr. Diana Eves   Chief Complaint: f/u TAVR  History of Present Illness:  Todd Romero is a 83 y.o. male with history of severe aortic stenosis s/p recent TAVR 03/2017, diffuse CAD (managed medically), chronic diastolic congestive heart failure, hypertension, obesity, obstructive sleep apnea, RBBB, insulin-dependent type 2 diabetes mellitus, chronic appearing anemia, and degenerative arthritis with somewhat limited mobility who presents for post-hospital TAVR follow-up.  He was reecently evaluated by multidisciplinary valve clinic for progressive AS with exertional SOB and fatigue. Prior echo in 2017 showed moderate AS. F/u 2D Echo 09/2016 showed normal EF with moderate AS, generally unchanged from prior. He underwent cardiac cath 12/2016 showing moderate AS, elevated LVEDP, heavy 3V coronary calcification, 100% mD2, 30-50% LAD, 50-90% stenosis of Cx proximal to origin of L-PDA, RCA not engaged due to poor catheter control (difficult procedure) - coronary status was essentially unchanged. Due to concern for underestimated AS, he underwent dobutamine stress echo 02/2017 suggestive of more severe AS. He was not felt to be a candidate for SAVR due to comorbidities, but was felt to benefit from TAVR - admitted 03/15/17 and underwent successful TAVR with Dr. Angelena Form and Dr. Roxy Manns with Oletta Lamas Sapien 3 THV (size 60mm) on 03/15/17. Post-op course rather uncomplicated, BUN/Cr 34/2.87 at DC similar to admit, Hgb 10.5 with plt 107 (admit level 10.2). Post-op echo 03/16/17 showed mild LVH, EF 55%, trivial peri valvular regurgitation of aortic valve at base of anterior mitral leaflet, felt to be stable.  He returns for follow-up with his wife. In general he is feeling much better. No further SOB. Energy level better. People at church have commented that color is  better. His LEE overall has been improving, but still waxes and wanes. He has noticed rare dizziness upon standing - had to steady himself on one episode. He himself did not bring this up but his wife pointed it out. No chest pain. No bleeding issues with surgical sites. Has noticed some tenderness to his right upper thigh but no hematoma, numbness, weakness or swelling. This has been improving spontaneously.   Past Medical History:  Diagnosis Date  . Anemia   . Arthritis   . CAD (coronary artery disease)    a. cardiac cath 12/2016 showing moderate AS, elevated LVEDP, heavy 3V coronary calcification, 100% mD2, 30-50% LAD, 50-90% stenosis of Cx proximal to origin of L-PDA, RCA not engaged due to poor catheter control (difficult procedure) - coronary status was essentially unchanged from prior.  . Chronic diastolic CHF (congestive heart failure) ([place])   . Degenerative arthritis   . Diabetes ([place])   . Dyspnea   . Essential hypertension   . Hyperlipidemia   . Obesity (BMI 30-39.9) 06/18/2015  . OSA (obstructive sleep apnea)    Severe with AHI 27/hr now on CPAP  no cpap  . Pure hypercholesterolemia   . RBBB   . [facility] spotted fever   . 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 Severe aortic stenosis    a. s/p TAVR 03/2017.  . Type II or unspecified type diabetes mellitus without mention of complication, not stated as uncontrolled     Past Surgical History:  Procedure Laterality Date  . APPENDECTOMY    . CARPAL TUNNEL RELEASE    .  KNEE CARTILAGE SURGERY    . PARTIAL COLECTOMY    . RIGHT/LEFT HEART CATH AND CORONARY ANGIOGRAPHY N/A 12/28/2016   Procedure: Right/Left Heart Cath and Coronary Angiography;  Surgeon: Belva Crome, MD;  Location: [place] [facility];  Service: Cardiovascular;  Laterality: N/A;  . TEE WITHOUT CARDIOVERSION N/A 03/15/2017   Procedure:  TRANSESOPHAGEAL ECHOCARDIOGRAM (TEE);  Surgeon: Burnell Blanks, MD;  Location: [place];  Service: Open Heart Surgery;  Laterality: N/A;  . TRANSCATHETER AORTIC VALVE REPLACEMENT, TRANSFEMORAL N/A 03/15/2017   Procedure: TRANSCATHETER AORTIC VALVE REPLACEMENT, TRANSFEMORAL;  Surgeon: Burnell Blanks, MD;  Location: [place];  Service: Open Heart Surgery;  Laterality: N/A;  . VEIN SURGERY      Current Medications: Current Meds  Medication Sig  . ACCU-CHEK AVIVA PLUS test strip USE AS INSTRUCTED TO CHECK BLOOD SUGAR TWICE A DAY  . acetaminophen (TYLENOL) 500 MG tablet Take 500 mg by mouth every 4 (four) hours as needed for moderate pain or headache.  [name] amLODipine (NORVASC) 10 MG tablet Take 5 mg by mouth daily.  [name] aspirin EC 81 MG tablet Take 81 mg by mouth daily.  . B-D INS SYR ULTRAFINE 1CC/31G 31G X 5/16" 1 ML MISC USE TO INJECT INSULIN DAILY  . clopidogrel (PLAVIX) 75 MG tablet Take 1 tablet (75 mg total) by mouth daily with breakfast.  . ferrous sulfate 325 (65 FE) MG tablet Take 325 mg by mouth daily with breakfast.  . fish oil-omega-3 fatty acids 1000 MG capsule Take 1 g by mouth daily at 12 noon.   . furosemide (LASIX) 40 MG tablet Take 40 mg by mouth daily at 12 noon.   [name] glimepiride (AMARYL) 2 MG tablet Take 0.5 tablets (1 mg total) by mouth daily.  . insulin lispro (HUMALOG KWIKPEN) 100 UNIT/ML KiwkPen 4-8 units before the main meals of the day  . insulin NPH Human (HUMULIN N,NOVOLIN N) 100 UNIT/ML injection Inject 0.24 mLs (24 Units total) into the skin at bedtime. Uses Vial  . Insulin Pen Needle (B-D UF III MINI PEN NEEDLES) 31G X 5 MM MISC USE 2 PEN NEEDLE PER DAY WITH HUMALOG AND VICTOZA  . liraglutide 18 MG/3ML SOPN Inject 0.3 mLs (1.8 mg total) into the skin at bedtime.  . metFORMIN (GLUMETZA) 500 MG (MOD) 24 hr tablet Take 500 mg by mouth 2 (two) times daily with a meal. (morning & noon)  . Multiple Vitamin (MULTIVITAMIN WITH MINERALS) TABS tablet Take 1 tablet by  mouth daily.  . Multiple Vitamins-Minerals (PRESERVISION AREDS 2 PO) Take 1 capsule by mouth 2 (two) times daily.  [name] olmesartan (BENICAR) 40 MG tablet Take 1 tablet (40 mg total) by mouth daily.  . pravastatin (PRAVACHOL) 80 MG tablet Take 1 tablet (80 mg total) by mouth every evening.  [name] spironolactone (ALDACTONE) 25 MG tablet Take 25 mg by mouth daily at 6 PM.   . tamsulosin (FLOMAX) 0.4 MG CAPS capsule Take 0.4 mg by mouth.     Allergies:   Morphine and related   Social History   Social History  . Marital status: Married    Spouse name: N/A  . Number of children: 4  . Years of education: N/A   Occupational History  . Construction-Retired    Social History Main Topics  . Smoking status: Never Smoker  . Smokeless tobacco: Never Used  . Alcohol use No  . Drug use: No  . Sexual activity: Not Asked   Other Topics Concern  . None  Social History Narrative  . None     Family History:  Family History  Problem Relation Age of Onset  . Heart failure Father   . Emphysema Father   . Hypertension Mother   . Cancer Sister   . Healthy Sister     ROS:   Please see the history of present illness.  All other systems are reviewed and otherwise negative.    PHYSICAL EXAM:   VS:  BP (!) 120/56   Pulse 60   Ht 5\' 9"  (1.753 m)   Wt 230 lb 12.8 oz (104.7 kg)   SpO2 96%   BMI 34.08 kg/m   BMI: Body mass index is 34.08 kg/m. GEN: Well nourished, well developed WM, in no acute distress  HEENT: normocephalic, atraumatic Neck: no JVD, carotid bruits, or masses Cardiac: RRR; no murmurs, rubs, or gallops, trace BLE edema  Respiratory:  clear to auscultation bilaterally, normal work of breathing GI: soft, nontender, nondistended, + BS MS: no deformity or atrophy  Skin: warm and dry, no rash. Bilateral groin surgical sites without hematoma, ecchymosis, or bruit. No obvious thigh abnormality to correlate to symptoms Neuro:  Alert and Oriented x 3, Strength and sensation are  intact, follows commands Psych: euthymic mood, full affect  Wt Readings from Last 3 Encounters:  03/30/17 230 lb 12.8 oz (104.7 kg)  03/17/17 236 lb 5.3 oz (107.2 kg)  03/11/17 235 lb 0.2 oz (106.6 kg)      Studies/Labs Reviewed:   EKG:  EKG was ordered today and personally reviewed by me and demonstrates NSR 61bpm, 1st degree AVB, RBBB, low p wave amplitude but appears similar to prior; occ PVCs  Recent Labs: 07/27/2016: TSH 0.82 10/21/2016: NT-Pro BNP 399 03/11/2017: ALT 17 03/16/2017: Magnesium 2.0 03/17/2017: BUN 27; Creatinine, Ser 1.11; Hemoglobin 10.0; Platelets 107; Potassium 4.2; Sodium 135   Lipid Panel    Component Value Date/Time   CHOL 180 10/29/2016 0911   TRIG 155.0 (H) 10/29/2016 0911   HDL 54.50 10/29/2016 0911   CHOLHDL 3 10/29/2016 0911   VLDL 31.0 10/29/2016 0911   LDLCALC 94 10/29/2016 0911   LDLDIRECT 101.0 04/26/2016 0915    Additional studies/ records that were reviewed today include: Summarized above.    ASSESSMENT & PLAN:   1. Severe AS s/p TAVR - overall doing well post-TAVR. SOB and fatigue much improved. He does report some dizziness upon standing. BP is low-normal for his age. Will stop amlodipine as I would rather tolerate a BP closer to the 130s/70s range than have him sustain a fall. Do not see any issues with surgical sites. Will refill Plavix to [facility] per patient preference. Ultimate duration of Plavix per Dr. Angelena Form, but anticipate ~6 months. Has f/u echo and appointment with Dr. Angelena Form 04/20/17. As per standard TAVR instructions, reaffirmed no driving for 4 weeks. I asked him to hold off resuming heavy lifting at this time until seen by Dr. Angelena Form to make sure his mild thigh tenderness resolves. He is planning on getting back to the gym and doing some walking. Was also referred to CRP2 in hospital. 2. Diffuse CAD - no recent anginal sx. Continue ASA, statin. 3. Chronic diastolic CHF - appears generally euvolemic, mild trace  edema on exam. 4. Anemia/thrombocytopenia - recheck CBC today to ensure stability given mild orthostasis-type sx. 5. PVCs - noted on EKG today. He reports prior h/o irregularity in his heartbeat. Check lytes. Not on BB I presume due to sinus bradycardia.  Disposition: F/u with  Dr. Angelena Form as scheduled 04/20/17 with echo.   Medication Adjustments/Labs and Tests Ordered: Current medicines are reviewed at length with the patient today.  Concerns regarding medicines are outlined above. Medication changes, Labs and Tests ordered today are summarized above and listed in the Patient Instructions accessible in Encounters.   Signed, Charlie Pitter, PA-C  03/30/2017 11:12 AM    [address], [place], [place]  [address] Phone: [phone]; Fax: [phone]

## 2017-03-30 ENCOUNTER — Telehealth: Payer: Self-pay | Admitting: *Deleted

## 2017-03-30 ENCOUNTER — Ambulatory Visit (INDEPENDENT_AMBULATORY_CARE_PROVIDER_SITE_OTHER): Payer: Medicare Other | Admitting: Physician Assistant

## 2017-03-30 ENCOUNTER — Encounter: Payer: Self-pay | Admitting: Physician Assistant

## 2017-03-30 VITALS — BP 120/56 | HR 60 | Ht 69.0 in | Wt 230.8 lb

## 2017-03-30 DIAGNOSIS — I493 Ventricular premature depolarization: Secondary | ICD-10-CM | POA: Diagnosis not present

## 2017-03-30 DIAGNOSIS — D649 Anemia, unspecified: Secondary | ICD-10-CM

## 2017-03-30 DIAGNOSIS — D696 Thrombocytopenia, unspecified: Secondary | ICD-10-CM

## 2017-03-30 DIAGNOSIS — I5032 Chronic diastolic (congestive) heart failure: Secondary | ICD-10-CM

## 2017-03-30 DIAGNOSIS — I2510 Atherosclerotic heart disease of native coronary artery without angina pectoris: Secondary | ICD-10-CM | POA: Diagnosis not present

## 2017-03-30 DIAGNOSIS — Z952 Presence of prosthetic heart valve: Secondary | ICD-10-CM | POA: Diagnosis not present

## 2017-03-30 LAB — CBC
HEMATOCRIT: 32.7 % — AB (ref 37.5–51.0)
Hemoglobin: 11.4 g/dL — ABNORMAL LOW (ref 13.0–17.7)
MCH: 30.7 pg (ref 26.6–33.0)
MCHC: 34.9 g/dL (ref 31.5–35.7)
MCV: 88 fL (ref 79–97)
PLATELETS: 222 10*3/uL (ref 150–379)
RBC: 3.71 x10E6/uL — AB (ref 4.14–5.80)
RDW: 13.9 % (ref 12.3–15.4)
WBC: 10.8 10*3/uL (ref 3.4–10.8)

## 2017-03-30 LAB — BASIC METABOLIC PANEL
BUN/Creatinine Ratio: 23 (ref 10–24)
BUN: 31 mg/dL — ABNORMAL HIGH (ref 8–27)
CALCIUM: 9.7 mg/dL (ref 8.6–10.2)
CO2: 21 mmol/L (ref 20–29)
CREATININE: 1.34 mg/dL — AB (ref 0.76–1.27)
Chloride: 101 mmol/L (ref 96–106)
GFR, EST AFRICAN AMERICAN: 57 mL/min/{1.73_m2} — AB (ref >59)
GFR, EST NON AFRICAN AMERICAN: 49 mL/min/{1.73_m2} — AB (ref >59)
Glucose: 105 mg/dL — ABNORMAL HIGH (ref 65–99)
POTASSIUM: 5.1 mmol/L (ref 3.5–5.2)
Sodium: 136 mmol/L (ref 134–144)

## 2017-03-30 LAB — MAGNESIUM: Magnesium: 2.3 mg/dL (ref 1.6–2.3)

## 2017-03-30 MED ORDER — CLOPIDOGREL BISULFATE 75 MG PO TABS: 75.0000 mg | ORAL_TABLET | Freq: Every day | ORAL | 1 refills | Status: DC

## 2017-03-30 NOTE — Patient Instructions (Addendum)
Medication Instructions:  Your physician has recommended you make the following change in your medication:  1.  STOP the Amlodipine   Labwork: TODAY:  BMET, CBC, & MAG  Testing/Procedures: None ordered  Follow-Up: Your physician recommends that you schedule a follow-up appointment in:  [place]   Any Other Special Instructions Will Be Listed Below (If Applicable).     If you need a refill on your cardiac medications before your next appointment, please call your pharmacy.

## 2017-03-30 NOTE — Telephone Encounter (Signed)
-----   Message from Charlie Pitter, [place] sent at 03/30/2017  4:14 PM EDT ----- Please let patient know labs show he is a little bit on the drier side compared to before. Potassium level is also upper limits of normal. Would recommend to hold spironolactone. Recheck BMET in 1 week to trend. Please monitor blood pressure occasionally at home given recent med changes. Call if you tend to get readings of greater than 130 on the top number or 80 on the bottom number. Since we are stopping diuretic, please make sure to continue to follow weight at home and call if >3lb overnight or 5lb within a few days, or any increased edema. Make sure to pay attention to sodium intake, limit to 2000mg  per day ([place] is likely going overboard). Dayna Dunn PA-C

## 2017-03-30 NOTE — Telephone Encounter (Signed)
Pt has been notified of lab results/findings by phone with verbal understanding. Pt agreeable and very happy that he can stop his spironolactone !!! Pt aware to monitor weight and call if if wt up 3 lb's x 1 day or 5 lb's in a few days. Bmet 04/07/17. Pt aware to call if SBP >287 or diastolic BP >68. Pt thanked me for the call today and the good news.

## 2017-04-06 ENCOUNTER — Other Ambulatory Visit: Payer: Self-pay | Admitting: Interventional Cardiology

## 2017-04-06 MED ORDER — FUROSEMIDE 40 MG PO TABS: 40.0000 mg | ORAL_TABLET | Freq: Every day | ORAL | 3 refills | Status: DC

## 2017-04-06 NOTE — Telephone Encounter (Signed)
Pt's medication was sent to pt's pharmacy as requested. Confirmation received.  °

## 2017-04-07 ENCOUNTER — Other Ambulatory Visit: Payer: Medicare Other | Admitting: *Deleted

## 2017-04-07 ENCOUNTER — Telehealth (HOSPITAL_COMMUNITY): Payer: Self-pay

## 2017-04-07 DIAGNOSIS — I5032 Chronic diastolic (congestive) heart failure: Secondary | ICD-10-CM

## 2017-04-07 NOTE — Telephone Encounter (Signed)
I called and left message on patient voicemail to contact office about scheduling for cardiac rehab. I left office contact information on patient voicemail to return call.  °

## 2017-04-08 ENCOUNTER — Telehealth: Payer: Self-pay | Admitting: Physician Assistant

## 2017-04-08 LAB — BASIC METABOLIC PANEL
BUN/Creatinine Ratio: 26 — ABNORMAL HIGH (ref 10–24)
BUN: 27 mg/dL (ref 8–27)
CALCIUM: 9.4 mg/dL (ref 8.6–10.2)
CHLORIDE: 105 mmol/L (ref 96–106)
CO2: 20 mmol/L (ref 20–29)
Creatinine, Ser: 1.02 mg/dL (ref 0.76–1.27)
GFR calc non Af Amer: 69 mL/min/{1.73_m2} (ref >59)
GFR, EST AFRICAN AMERICAN: 79 mL/min/{1.73_m2} (ref >59)
Glucose: 108 mg/dL — ABNORMAL HIGH (ref 65–99)
POTASSIUM: 4.9 mmol/L (ref 3.5–5.2)
SODIUM: 140 mmol/L (ref 134–144)

## 2017-04-08 NOTE — Telephone Encounter (Signed)
New message    Pt wife is calling asking about the results of his lab work. Please call.

## 2017-04-08 NOTE — Telephone Encounter (Signed)
Called with results.

## 2017-04-13 ENCOUNTER — Encounter (HOSPITAL_COMMUNITY): Payer: Self-pay

## 2017-04-13 ENCOUNTER — Telehealth (HOSPITAL_COMMUNITY): Payer: Self-pay

## 2017-04-13 NOTE — Telephone Encounter (Signed)
I called and left message on patient voicemail to contact office about scheduling for cardiac rehab. I left office contact information on patient voicemail to return call.  °

## 2017-04-19 NOTE — Progress Notes (Signed)
Chief Complaint  Patient presents with  . Follow-up    post tavr     History of Present Illness: 83 yo male with history of severe aortic valve stenosis, CAD, DM, HLD and HTN who is here today for one month TAVR follow up. He underwent TAVR on 03/15/17 and had a 29 mm Edwards Sapien 3 bioprosthetic AVR placed from the right transfemoral artery.  He had an uneventful post operative course.   He is here today for follow up. The patient denies any chest pain, dyspnea, palpitations, lower extremity edema, orthopnea, PND, dizziness, near syncope or syncope. He feels great. Spironolactone stopped at last visit with Melina Copa, PA-C due to hyperkalemia.   Primary Care Physician: [name], MD  Primary cardiologist: Daneen Schick, MD   Past Medical History:  Diagnosis Date  . Anemia   . Arthritis   . CAD (coronary artery disease)    a. cardiac cath 12/2016 showing moderate AS, elevated LVEDP, heavy 3V coronary calcification, 100% mD2, 30-50% LAD, 50-90% stenosis of Cx proximal to origin of L-PDA, RCA not engaged due to poor catheter control (difficult procedure) - coronary status was essentially unchanged from prior.  . Chronic diastolic CHF (congestive heart failure) ([place])   . Degenerative arthritis   . Diabetes ([place])   . Dyspnea   . Essential hypertension   . Hyperlipidemia   . Obesity (BMI 30-39.9) 06/18/2015  . OSA (obstructive sleep apnea)    Severe with AHI 27/hr now on CPAP  no cpap  . Pure hypercholesterolemia   . RBBB   . [facility] spotted fever   . 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 Severe aortic stenosis    a. s/p TAVR 03/2017.  . Type II or unspecified type diabetes mellitus without mention of complication, not stated as uncontrolled     Past Surgical History:  Procedure Laterality Date  . APPENDECTOMY    . CARPAL TUNNEL RELEASE    .  KNEE CARTILAGE SURGERY    . PARTIAL COLECTOMY    . RIGHT/LEFT HEART CATH AND CORONARY ANGIOGRAPHY N/A 12/28/2016   Procedure: Right/Left Heart Cath and Coronary Angiography;  Surgeon: Belva Crome, MD;  Location: [place] [facility];  Service: Cardiovascular;  Laterality: N/A;  . TEE WITHOUT CARDIOVERSION N/A 03/15/2017   Procedure: TRANSESOPHAGEAL ECHOCARDIOGRAM (TEE);  Surgeon: Burnell Blanks, MD;  Location: [place];  Service: Open Heart Surgery;  Laterality: N/A;  . TRANSCATHETER AORTIC VALVE REPLACEMENT, TRANSFEMORAL N/A 03/15/2017   Procedure: TRANSCATHETER AORTIC VALVE REPLACEMENT, TRANSFEMORAL;  Surgeon: Burnell Blanks, MD;  Location: [place];  Service: Open Heart Surgery;  Laterality: N/A;  . VEIN SURGERY      Current Outpatient Prescriptions  Medication Sig Dispense Refill  . ACCU-CHEK AVIVA PLUS test strip USE AS INSTRUCTED TO CHECK BLOOD SUGAR TWICE A DAY 200 each 2  . acetaminophen (TYLENOL) 500 MG tablet Take 500 mg by mouth every 4 (four) hours as needed for moderate pain or headache.    [name] aspirin EC 81 MG tablet Take 81 mg by mouth daily.    . B-D INS SYR ULTRAFINE 1CC/31G 31G X 5/16" 1 ML MISC USE TO INJECT INSULIN DAILY 100 each 3  . clopidogrel (PLAVIX) 75 MG tablet Take 1 tablet (75 mg total) by mouth daily with breakfast. 90 tablet 1  . ferrous sulfate 325 (65 FE) MG tablet  Take 325 mg by mouth daily with breakfast.    . fish oil-omega-3 fatty acids 1000 MG capsule Take 1 g by mouth daily at 12 noon.     [name] glimepiride (AMARYL) 2 MG tablet Take 0.5 tablets (1 mg total) by mouth daily. 45 tablet 1  . insulin lispro (HUMALOG KWIKPEN) 100 UNIT/ML KiwkPen 4-8 units before the main meals of the day 15 mL 1  . insulin NPH Human (HUMULIN N,NOVOLIN N) 100 UNIT/ML injection Inject 0.24 mLs (24 Units total) into the skin at bedtime. Uses Vial 20 mL 1  . Insulin Pen Needle (B-D UF III MINI PEN NEEDLES) 31G X 5 MM MISC USE 2 PEN NEEDLE PER DAY WITH HUMALOG AND VICTOZA 200  each 3  . liraglutide 18 MG/3ML SOPN Inject 0.3 mLs (1.8 mg total) into the skin at bedtime. 15 pen 2  . metFORMIN (GLUMETZA) 500 MG (MOD) 24 hr tablet Take 500 mg by mouth 2 (two) times daily with a meal. (morning & noon)    . Multiple Vitamin (MULTIVITAMIN WITH MINERALS) TABS tablet Take 1 tablet by mouth daily.    . Multiple Vitamins-Minerals (PRESERVISION AREDS 2 PO) Take 1 capsule by mouth 2 (two) times daily.    [name] olmesartan (BENICAR) 40 MG tablet Take 1 tablet (40 mg total) by mouth daily. 90 tablet 3  . pravastatin (PRAVACHOL) 80 MG tablet Take 1 tablet (80 mg total) by mouth every evening. 90 tablet 1  . tamsulosin (FLOMAX) 0.4 MG CAPS capsule Take 0.4 mg by mouth.    . furosemide (LASIX) 20 MG tablet Take 1 tablet (20 mg total) by mouth daily. 90 tablet 3   No current facility-administered medications for this visit.     Allergies  Allergen Reactions  . Morphine And Related Nausea And Vomiting    Social History   Social History  . Marital status: Married    Spouse name: N/A  . Number of children: 4  . Years of education: N/A   Occupational History  . Construction-Retired    Social History Main Topics  . Smoking status: Never Smoker  . Smokeless tobacco: Never Used  . Alcohol use No  . Drug use: No  . Sexual activity: Not on file   Other Topics Concern  . Not on file   Social History Narrative  . No narrative on file    Family History  Problem Relation Age of Onset  . Heart failure Father   . Emphysema Father   . Hypertension Mother   . Cancer Sister   . Healthy Sister     Review of Systems:  As stated in the HPI and otherwise negative.   BP (!) 148/70   Pulse 66   Ht 5\' 9"  (1.753 m)   Wt 231 lb 3.2 oz (104.9 kg)   SpO2 98%   BMI 34.14 kg/m   Physical Examination:  General: Well developed, well nourished, NAD  HEENT: OP clear, mucus membranes moist  SKIN: warm, dry. No rashes. Neuro: No focal deficits  Musculoskeletal: Muscle strength 5/5  all ext  Psychiatric: Mood and affect normal  Neck: No JVD, no carotid bruits, no thyromegaly, no lymphadenopathy.  Lungs:Clear bilaterally, no wheezes, rhonci, crackles Cardiovascular: Regular rate and rhythm. No murmurs, gallops or rubs. Abdomen:Soft. Bowel sounds present. Non-tender.  Extremities: No lower extremity edema. Pulses are 2 + in the bilateral DP/PT.  Echo April 20, 2017: Normal LV systolic function Normally functioning bioprosthetic aortic valve, no AI  EKG:  EKG  is not   ordered today.  Recent Labs: 07/27/2016: TSH 0.82 10/21/2016: NT-Pro BNP 399 03/11/2017: ALT 17 03/30/2017: Hemoglobin 11.4; Magnesium 2.3; Platelets 222 04/07/2017: BUN 27; Creatinine, Ser 1.02; Potassium 4.9; Sodium 140   Lipid Panel    Component Value Date/Time   CHOL 180 10/29/2016 0911   TRIG 155.0 (H) 10/29/2016 0911   HDL 54.50 10/29/2016 0911   CHOLHDL 3 10/29/2016 0911   VLDL 31.0 10/29/2016 0911   LDLCALC 94 10/29/2016 0911   LDLDIRECT 101.0 04/26/2016 0915     Wt Readings from Last 3 Encounters:  04/20/17 231 lb 3.2 oz (104.9 kg)  03/30/17 230 lb 12.8 oz (104.7 kg)  03/17/17 236 lb 5.3 oz (107.2 kg)     Other studies Reviewed: Additional studies/ records that were reviewed today include: I have personally reviewed his echo images.  Review of the above records demonstrates: AS  Assessment and Plan:   1. Aortic valve stenosis: He is now one month s/p TAVR. He is doing well. His dyspnea has improved. He is NYHA class 1. Echo today shows normally functioning AVR with normal LV systolic function. Will continue ASA and Plavix. Follow up one year with me with echo. He will see Dr. Tamala Julian in 6 months  2. Chronic diastolic CHF: Weight is down post TAVR. No LE edema. Will reduce Lasix to 20 mg daily. Follow daily weights. Use extra Lasix as needed.   Current medicines are reviewed at length with the patient today.  The patient does not have concerns regarding medicines.  The following  changes have been made:  no change  Labs/ tests ordered today include:   No orders of the defined types were placed in this encounter.  Disposition:  Follow up with me in one year for one year TAVR visit with echo. Follow up Dr. Tamala Julian in 6 months.   Signed, Lauree Chandler, MD 04/20/2017 11:20 AM    [address], [place], [place]  [address] Phone: [phone]; Fax: [phone]

## 2017-04-20 ENCOUNTER — Ambulatory Visit (INDEPENDENT_AMBULATORY_CARE_PROVIDER_SITE_OTHER): Payer: Medicare Other | Admitting: Cardiovascular Disease

## 2017-04-20 ENCOUNTER — Other Ambulatory Visit: Payer: Self-pay

## 2017-04-20 ENCOUNTER — Ambulatory Visit (HOSPITAL_COMMUNITY): Payer: Medicare Other | Attending: Cardiovascular Disease

## 2017-04-20 ENCOUNTER — Encounter: Payer: Self-pay | Admitting: Cardiovascular Disease

## 2017-04-20 VITALS — BP 148/70 | HR 66 | Ht 69.0 in | Wt 231.2 lb

## 2017-04-20 DIAGNOSIS — I517 Cardiomegaly: Secondary | ICD-10-CM | POA: Insufficient documentation

## 2017-04-20 DIAGNOSIS — I348 Other nonrheumatic mitral valve disorders: Secondary | ICD-10-CM | POA: Diagnosis not present

## 2017-04-20 DIAGNOSIS — I350 Nonrheumatic aortic (valve) stenosis: Secondary | ICD-10-CM

## 2017-04-20 DIAGNOSIS — I5032 Chronic diastolic (congestive) heart failure: Secondary | ICD-10-CM | POA: Diagnosis not present

## 2017-04-20 DIAGNOSIS — Z952 Presence of prosthetic heart valve: Secondary | ICD-10-CM

## 2017-04-20 MED ORDER — FUROSEMIDE 20 MG PO TABS: 20.0000 mg | ORAL_TABLET | Freq: Every day | ORAL | 3 refills | Status: DC

## 2017-04-20 NOTE — Patient Instructions (Signed)
Medication Instructions:  Your physician has recommended you make the following change in your medication:  Decrease furosemide to 20 mg by mouth daily.    Labwork: none  Testing/Procedures: Your physician has requested that you have an echocardiogram. Echocardiography is a painless test that uses sound waves to create images of your heart. It provides your doctor with information about the size and shape of your heart and how well your heart's chambers and valves are working. This procedure takes approximately one hour. There are no restrictions for this procedure.  To be done in 11 months on day of appointment with Dr. Angelena Form. We will call you to schedule this appointment.   Follow-Up: Your physician wants you to follow-up in: 6 months with Dr. Tamala Julian. You will receive a reminder letter in the mail two months in advance. If you don't receive a letter, please call our office to schedule the follow-up appointment.  Your physician recommends that you schedule a follow-up appointment in: 11 months with Dr. Angelena Form. We will call you to schedule this appointment.    Any Other Special Instructions Will Be Listed Below (If Applicable).     If you need a refill on your cardiac medications before your next appointment, please call your pharmacy.

## 2017-04-26 LAB — HM DIABETES EYE EXAM

## 2017-05-02 ENCOUNTER — Other Ambulatory Visit (INDEPENDENT_AMBULATORY_CARE_PROVIDER_SITE_OTHER): Payer: Medicare Other

## 2017-05-02 DIAGNOSIS — D508 Other iron deficiency anemias: Secondary | ICD-10-CM

## 2017-05-02 DIAGNOSIS — E782 Mixed hyperlipidemia: Secondary | ICD-10-CM

## 2017-05-02 DIAGNOSIS — Z794 Long term (current) use of insulin: Secondary | ICD-10-CM | POA: Diagnosis not present

## 2017-05-02 DIAGNOSIS — E1165 Type 2 diabetes mellitus with hyperglycemia: Secondary | ICD-10-CM

## 2017-05-02 LAB — CBC WITH DIFFERENTIAL/PLATELET
BASOS PCT: 0.7 % (ref 0.0–3.0)
Basophils Absolute: 0.1 10*3/uL (ref 0.0–0.1)
EOS ABS: 0.2 10*3/uL (ref 0.0–0.7)
EOS PCT: 2 % (ref 0.0–5.0)
HEMATOCRIT: 34.9 % — AB (ref 39.0–52.0)
HEMOGLOBIN: 11.4 g/dL — AB (ref 13.0–17.0)
LYMPHS PCT: 27.8 % (ref 12.0–46.0)
Lymphs Abs: 2.1 10*3/uL (ref 0.7–4.0)
MCHC: 32.7 g/dL (ref 30.0–36.0)
MCV: 93.4 fl (ref 78.0–100.0)
Monocytes Absolute: 0.9 10*3/uL (ref 0.1–1.0)
Monocytes Relative: 11.5 % (ref 3.0–12.0)
Neutro Abs: 4.4 10*3/uL (ref 1.4–7.7)
Neutrophils Relative %: 58 % (ref 43.0–77.0)
Platelets: 155 10*3/uL (ref 150.0–400.0)
RBC: 3.74 Mil/uL — ABNORMAL LOW (ref 4.22–5.81)
RDW: 14.5 % (ref 11.5–15.5)
WBC: 7.7 10*3/uL (ref 4.0–10.5)

## 2017-05-02 LAB — COMPREHENSIVE METABOLIC PANEL
ALBUMIN: 3.9 g/dL (ref 3.5–5.2)
ALT: 13 U/L (ref 0–53)
AST: 16 U/L (ref 0–37)
Alkaline Phosphatase: 50 U/L (ref 39–117)
BUN: 27 mg/dL — ABNORMAL HIGH (ref 6–23)
CALCIUM: 9.2 mg/dL (ref 8.4–10.5)
CO2: 26 mEq/L (ref 19–32)
Chloride: 105 mEq/L (ref 96–112)
Creatinine, Ser: 1.09 mg/dL (ref 0.40–1.50)
GFR: 68.99 mL/min (ref >60.00)
Glucose, Bld: 140 mg/dL — ABNORMAL HIGH (ref 70–99)
POTASSIUM: 4.4 meq/L (ref 3.5–5.1)
Sodium: 137 mEq/L (ref 135–145)
Total Bilirubin: 0.4 mg/dL (ref 0.2–1.2)
Total Protein: 6.6 g/dL (ref 6.0–8.3)

## 2017-05-02 LAB — LIPID PANEL
CHOL/HDL RATIO: 4
Cholesterol: 175 mg/dL (ref 0–200)
HDL: 46.6 mg/dL (ref >39.00)
LDL CALC: 91 mg/dL (ref 0–99)
NONHDL: 127.94
Triglycerides: 184 mg/dL — ABNORMAL HIGH (ref 0.0–149.0)
VLDL: 36.8 mg/dL (ref 0.0–40.0)

## 2017-05-02 LAB — IBC PANEL
Iron: 58 ug/dL (ref 42–165)
Saturation Ratios: 17.9 % — ABNORMAL LOW (ref 20.0–50.0)
TRANSFERRIN: 231 mg/dL (ref 212.0–360.0)

## 2017-05-02 LAB — HEMOGLOBIN A1C: Hgb A1c MFr Bld: 7.3 % — ABNORMAL HIGH (ref 4.6–6.5)

## 2017-05-06 ENCOUNTER — Other Ambulatory Visit: Payer: Self-pay

## 2017-05-06 ENCOUNTER — Encounter: Payer: Self-pay | Admitting: Endocrinology

## 2017-05-06 ENCOUNTER — Ambulatory Visit (INDEPENDENT_AMBULATORY_CARE_PROVIDER_SITE_OTHER): Payer: Medicare Other | Admitting: Endocrinology

## 2017-05-06 VITALS — BP 158/68 | HR 71 | Ht 69.0 in | Wt 238.0 lb

## 2017-05-06 DIAGNOSIS — I10 Essential (primary) hypertension: Secondary | ICD-10-CM

## 2017-05-06 DIAGNOSIS — Z0000 Encounter for general adult medical examination without abnormal findings: Secondary | ICD-10-CM

## 2017-05-06 DIAGNOSIS — E1165 Type 2 diabetes mellitus with hyperglycemia: Secondary | ICD-10-CM

## 2017-05-06 DIAGNOSIS — D508 Other iron deficiency anemias: Secondary | ICD-10-CM | POA: Diagnosis not present

## 2017-05-06 DIAGNOSIS — Z794 Long term (current) use of insulin: Secondary | ICD-10-CM

## 2017-05-06 DIAGNOSIS — E782 Mixed hyperlipidemia: Secondary | ICD-10-CM

## 2017-05-06 MED ORDER — GLIMEPIRIDE 2 MG PO TABS: 1.0000 mg | ORAL_TABLET | Freq: Every day | ORAL | 3 refills | Status: DC

## 2017-05-06 MED ORDER — METFORMIN HCL ER (MOD) 500 MG PO TB24: 500.0000 mg | ORAL_TABLET | Freq: Two times a day (BID) | ORAL | 4 refills | Status: DC

## 2017-05-06 NOTE — Patient Instructions (Addendum)
N insulin 24 units Humalog: Take upto 10 units for larger meals or with more Carbs  Amlodipine 2.5mg  daily  Bring Living will  Take Vitamin C with Iron

## 2017-05-06 NOTE — Progress Notes (Signed)
Patient ID: Todd Romero, male   DOB: 1934-01-25, 83 y.o.   MRN: [id number]    Reason for Appointment: Complete physical exam and management of chronic problems    History of Present Illness   Diagnosis: Type 2 DIABETES MELITUS, date of diagnosis:  1997   He has been on various regimens for his diabetes and has been on bedtime insulin with NPH since 2005 He also has benefited from adding Victoza in 2011 with better postprandial control and some weight loss Previously his weight has been as much as 247 pounds  On Victoza  since 11/14 with further improvement in blood sugar control. Since about 2015 his blood sugars have been overall mildly high with A1c over 7% consistently.  RECENT HISTORY:  On this visit A1c is better at 7.3, in February was 7.8  Oral hypoglycemic drugs: Amaryl 1 mg in the a.m. and metformin 500 mg twice a day        Side effects from medications: None Insulin regimen: NPH 26 units at bedtime and Humalog 4-8 units at suppertime    Current management, blood sugar patterns and problems identified:  Humalog was added at suppertime in 2/18 when his readings at night for mostly high and his A1c had gone up  He has not been seen for several months for follow-up  He does have a few readings over 200 after supper even with taking 8 units of insulin before eating  He does not always be consistent with diet especially recently with eating out with family, highest reading 221 after eating lasagna  He has no readings as low as 126 after supper if he is eating smaller meals with less carbohydrate  FASTING readings are excellent and fairly consistent with lowest reading 87   He has occasional readings in the afternoon also and these are not as consistently high  Does take his insulin and Victoza injections consistently as directed  Monitors blood glucose:about 2x a day.    Glucometer:  Accu-Chek         Blood Glucose readings from meter download:    Mean values apply above for all meters except median for One Touch  PRE-MEAL Fasting Lunch Dinner Bedtime Overall  Glucose range: 87 142     1 28-221    Mean/median: 105   158  175  137     Physical activity: exercise: Unable to do much activity because of recent knee pain  Meal times: Dinner about 5-7 PM  Wt Readings from Last 3 Encounters:  05/06/17 238 lb (108 kg)  04/20/17 231 lb 3.2 oz (104.9 kg)  03/30/17 230 lb 12.8 oz (104.7 kg)   LABS: Lab Results  Component Value Date   HGBA1C 7.3 (H) 05/02/2017   HGBA1C 7.4 (H) 03/11/2017   HGBA1C 7.8 (H) 10/29/2016   Lab Results  Component Value Date   MICROALBUR 4.2 (H) 10/29/2016   LDLCALC 91 05/02/2017   CREATININE 1.09 05/02/2017    PREVENTIVE care:    Fall prevention: He has not had any tendency to falls        Advised regular self-examination of his feet   Nutrition: needs better management of evening meals with consistently lower fat meals, moderation of carbohydrate and portions  Lipid screening done  Colorectal screening: Needs Hemoccults  Regular eye and dental  exams to be continued   Continue 81 mg aspirin   Vaccines   Zostavax / Pneumococcal Vaccine/ influenza  up-to-date  Labs up to date  OTHER active problems are discussed in review of systems  Lab on 05/02/2017  Component Date Value Ref Range Status  . Hgb A1c MFr Bld 05/02/2017 7.3* 4.6 - 6.5 % Final   Glycemic Control Guidelines for People with Diabetes:Non Diabetic:  <6%Goal of Therapy: <7%Additional Action Suggested:  >8%   . Sodium 05/02/2017 137  135 - 145 mEq/L Final  . Potassium 05/02/2017 4.4  3.5 - 5.1 mEq/L Final  . Chloride 05/02/2017 105  96 - 112 mEq/L Final  . CO2 05/02/2017 26  19 - 32 mEq/L Final  . Glucose, Bld 05/02/2017 140* 70 - 99 mg/dL Final  . BUN 05/02/2017 27* 6 - 23 mg/dL Final  . Creatinine, Ser 05/02/2017 1.09  0.40 - 1.50 mg/dL Final  . Total Bilirubin 05/02/2017 0.4  0.2 - 1.2 mg/dL Final  . Alkaline  Phosphatase 05/02/2017 50  39 - 117 U/L Final  . AST 05/02/2017 16  0 - 37 U/L Final  . ALT 05/02/2017 13  0 - 53 U/L Final  . Total Protein 05/02/2017 6.6  6.0 - 8.3 g/dL Final  . Albumin 05/02/2017 3.9  3.5 - 5.2 g/dL Final  . Calcium 05/02/2017 9.2  8.4 - 10.5 mg/dL Final  . GFR 05/02/2017 68.99  >60.00 mL/min Final  . WBC 05/02/2017 7.7  4.0 - 10.5 K/uL Final  . RBC 05/02/2017 3.74* 4.22 - 5.81 Mil/uL Final  . Hemoglobin 05/02/2017 11.4* 13.0 - 17.0 g/dL Final  . HCT 05/02/2017 34.9* 39.0 - 52.0 % Final  . MCV 05/02/2017 93.4  78.0 - 100.0 fl Final  . MCHC 05/02/2017 32.7  30.0 - 36.0 g/dL Final  . RDW 05/02/2017 14.5  11.5 - 15.5 % Final  . Platelets 05/02/2017 155.0  150.0 - 400.0 K/uL Final  . Neutrophils Relative % 05/02/2017 58.0  43.0 - 77.0 % Final  . Lymphocytes Relative 05/02/2017 27.8  12.0 - 46.0 % Final  . Monocytes Relative 05/02/2017 11.5  3.0 - 12.0 % Final  . Eosinophils Relative 05/02/2017 2.0  0.0 - 5.0 % Final  . Basophils Relative 05/02/2017 0.7  0.0 - 3.0 % Final  . Neutro Abs 05/02/2017 4.4  1.4 - 7.7 K/uL Final  . Lymphs Abs 05/02/2017 2.1  0.7 - 4.0 K/uL Final  . Monocytes Absolute 05/02/2017 0.9  0.1 - 1.0 K/uL Final  . Eosinophils Absolute 05/02/2017 0.2  0.0 - 0.7 K/uL Final  . Basophils Absolute 05/02/2017 0.1  0.0 - 0.1 K/uL Final  . Iron 05/02/2017 58  42 - 165 ug/dL Final  . Transferrin 05/02/2017 231.0  212.0 - 360.0 mg/dL Final  . Saturation Ratios 05/02/2017 17.9* 20.0 - 50.0 % Final  . Cholesterol 05/02/2017 175  0 - 200 mg/dL Final   ATP III Classification       Desirable:  < 200 mg/dL               Borderline High:  200 - 239 mg/dL          High:  > = 240 mg/dL  . Triglycerides 05/02/2017 184.0* 0.0 - 149.0 mg/dL Final   Normal:  <150 mg/dLBorderline High:  150 - 199 mg/dL  . HDL 05/02/2017 46.60  >39.00 mg/dL Final  . VLDL 05/02/2017 36.8  0.0 - 40.0 mg/dL Final  . LDL Cholesterol 05/02/2017 91  0 - 99 mg/dL Final  . Total CHOL/HDL Ratio  05/02/2017 4   Final  Men          Women1/2 Average Risk     3.4          3.3Average Risk          5.0          4.42X Average Risk          9.6          7.13X Average Risk          15.0          11.0                      . NonHDL 05/02/2017 127.94   Final   NOTE:  Non-HDL goal should be 30 mg/dL higher than patient's LDL goal (i.e. LDL goal of < 70 mg/dL, would have non-HDL goal of < 100 mg/dL)    Allergies as of 05/06/2017      Reactions   Morphine And Related Nausea And Vomiting      Medication List       Accurate as of 05/06/17 10:24 AM. Always use your most recent med list.          ACCU-CHEK AVIVA PLUS test strip Generic drug:  glucose blood USE AS INSTRUCTED TO CHECK BLOOD SUGAR TWICE A DAY   acetaminophen 500 MG tablet Commonly known as:  TYLENOL Take 500 mg by mouth every 4 (four) hours as needed for moderate pain or headache.   aspirin EC 81 MG tablet Take 81 mg by mouth daily.   B-D INS SYR ULTRAFINE 1CC/31G 31G X 5/16" 1 ML Misc Generic drug:  Insulin Syringe-Needle U-100 USE TO INJECT INSULIN DAILY   clopidogrel 75 MG tablet Commonly known as:  PLAVIX Take 1 tablet (75 mg total) by mouth daily with breakfast.   ferrous sulfate 325 (65 FE) MG tablet Take 325 mg by mouth daily with breakfast.   fish oil-omega-3 fatty acids 1000 MG capsule Take 1 g by mouth daily at 12 noon.   furosemide 20 MG tablet Commonly known as:  LASIX Take 1 tablet (20 mg total) by mouth daily.   glimepiride 2 MG tablet Commonly known as:  AMARYL Take 0.5 tablets (1 mg total) by mouth daily.   insulin lispro 100 UNIT/ML KiwkPen Commonly known as:  HUMALOG KWIKPEN 4-8 units before the main meals of the day   insulin NPH Human 100 UNIT/ML injection Commonly known as:  HUMULIN N,NOVOLIN N Inject 0.24 mLs (24 Units total) into the skin at bedtime. Uses Vial   Insulin Pen Needle 31G X 5 MM Misc Commonly known as:  B-D UF III MINI PEN NEEDLES USE 2 PEN NEEDLE PER  DAY WITH HUMALOG AND VICTOZA   liraglutide 18 MG/3ML Sopn Inject 0.3 mLs (1.8 mg total) into the skin at bedtime.   metFORMIN 500 MG (MOD) 24 hr tablet Commonly known as:  GLUMETZA Take 500 mg by mouth 2 (two) times daily with a meal. (morning & noon)   multivitamin with minerals Tabs tablet Take 1 tablet by mouth daily.   olmesartan 40 MG tablet Commonly known as:  BENICAR Take 1 tablet (40 mg total) by mouth daily.   pravastatin 80 MG tablet Commonly known as:  PRAVACHOL Take 1 tablet (80 mg total) by mouth every evening.   PRESERVISION AREDS 2 PO Take 1 capsule by mouth 2 (two) times daily.   tamsulosin 0.4 MG Caps capsule Commonly known as:  FLOMAX Take 0.4 mg by  mouth.       Allergies:  Allergies  Allergen Reactions  . Morphine And Related Nausea And Vomiting    Past Medical History:  Diagnosis Date  . Anemia   . Arthritis   . CAD (coronary artery disease)    a. cardiac cath 12/2016 showing moderate AS, elevated LVEDP, heavy 3V coronary calcification, 100% mD2, 30-50% LAD, 50-90% stenosis of Cx proximal to origin of L-PDA, RCA not engaged due to poor catheter control (difficult procedure) - coronary status was essentially unchanged from prior.  . Chronic diastolic CHF (congestive heart failure) ([place])   . Degenerative arthritis   . Diabetes ([place])   . Dyspnea   . Essential hypertension   . Hyperlipidemia   . Obesity (BMI 30-39.9) 06/18/2015  . OSA (obstructive sleep apnea)    Severe with AHI 27/hr now on CPAP  no cpap  . Pure hypercholesterolemia   . RBBB   . [facility] spotted fever   . 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 Severe aortic stenosis    a. s/p TAVR 03/2017.  . Type II or unspecified type diabetes mellitus without mention of complication, not stated as uncontrolled     Past Surgical History:  Procedure  Laterality Date  . APPENDECTOMY    . CARPAL TUNNEL RELEASE    . KNEE CARTILAGE SURGERY    . PARTIAL COLECTOMY    . RIGHT/LEFT HEART CATH AND CORONARY ANGIOGRAPHY N/A 12/28/2016   Procedure: Right/Left Heart Cath and Coronary Angiography;  Surgeon: Belva Crome, MD;  Location: [facility];  Service: Cardiovascular;  Laterality: N/A;  . TEE WITHOUT CARDIOVERSION N/A 03/15/2017   Procedure: TRANSESOPHAGEAL ECHOCARDIOGRAM (TEE);  Surgeon: Burnell Blanks, MD;  Location: [facility];  Service: Open Heart Surgery;  Laterality: N/A;  . TRANSCATHETER AORTIC VALVE REPLACEMENT, TRANSFEMORAL N/A 03/15/2017   Procedure: TRANSCATHETER AORTIC VALVE REPLACEMENT, TRANSFEMORAL;  Surgeon: Burnell Blanks, MD;  Location: [place];  Service: Open Heart Surgery;  Laterality: N/A;  . VEIN SURGERY      Family History  Problem Relation Age of Onset  . Heart failure Father   . Emphysema Father   . Hypertension Mother   . Cancer Sister   . Healthy Sister     Social History:  reports that he has never smoked. He has never used smokeless tobacco. He reports that he does not drink alcohol or use drugs.  Review of Systems:  HYPERTENSION:   Currently being treated with Benicar Recently amlodipine was stopped by his cardiologist because of an episode of faintness but he does not know if his blood pressure has been low He has  checked blood pressure regularly at home and he thinks it is overall higher, reading around 150, recently had been mostly below 140  CARDIAC history:  He now does not complain of shortness of breath on exertion, this has improved since his aortic valve replacement He is now only taking 20 mg Lasix, previously on as much as 80 mg   HYPERLIPIDEMIA: The lipid abnormality consists of elevated LDL and he is taking 40 mg Pravachol, no side effects. LDL is above 100  Lab Results  Component Value Date   CHOL 175 05/02/2017   HDL 46.60 05/02/2017   LDLCALC 91 05/02/2017    LDLDIRECT 101.0 04/26/2016   TRIG 184.0 (H) 05/02/2017   CHOLHDL 4 05/02/2017    History  of daytime fatigability and somnolence if sitting still, Secondary to sleep apnea.   He had an abnormal sleep studyAt baseline Has having Less difficulty tolerating his current CPAP treatment   History of overactive bladder for which he takes Enablex only when he is traveling   IRON deficiency anemia: He is taking iron supplements recently everyday but his iron saturation and hemoglobin is still relatively low   No history of B-12 deficiency   Lab Results  Component Value Date   WBC 7.7 05/02/2017   HGB 11.4 (L) 05/02/2017   HCT 34.9 (L) 05/02/2017   MCV 93.4 05/02/2017   PLT 155.0 05/02/2017   Foot exam last done in 04/2017  Review of Systems  Constitutional: Negative for malaise/fatigue.  Eyes: Negative for blurred vision.  Cardiovascular: Positive for leg swelling. Negative for orthopnea and PND.  Gastrointestinal: Negative for abdominal pain, diarrhea and nausea.       Was having diarrhea with more than 1000 mg of metformin  Genitourinary: Negative for dysuria.  Musculoskeletal: Positive for joint pain.       Recently having more pain in his right knee after starting exercise  Neurological: Negative for dizziness and headaches.      Examination:   BP (!) 158/68   Pulse 71   Ht 5\' 9"  (1.753 m)   Wt 238 lb (108 kg)   SpO2 97%   BMI 35.15 kg/m   Body mass index is 35.15 kg/m.   Standing blood pressure 130/70 left arm  Physical Exam  Constitutional: He appears well-developed and well-nourished.  HENT:  Mouth/Throat: Oropharynx is clear and moist.  Eyes: Conjunctivae are normal.  Fundii show no retinopathy, right fundus is not is clearly seen  Neck: No thyromegaly present.  Cardiovascular: Regular rhythm and normal heart sounds.  Exam reveals no gallop.   No murmur heard. No significant murmur heard S2 normally split  Pulmonary/Chest: Breath sounds normal. He has no  wheezes. He has no rales.  Abdominal: Soft. He exhibits no mass. There is no tenderness.  No abnormal epigastric pulsation  Genitourinary: Rectum normal.  Genitourinary Comments: Prostate 2+ enlarged, smooth and no nodules felt.  Superior part of prostate could not be palpated  Musculoskeletal: He exhibits edema.  1+ right lower leg edema and around the ankle. He has significant swelling and mild warmth of the right knee mild restriction in movement  Lymphadenopathy:    He has no cervical adenopathy.  Neurological: No cranial nerve deficit. He exhibits normal muscle tone.  Skin: No rash noted. No pallor.  Psychiatric: He has a normal mood and affect.      Diabetic Foot Exam - Simple   Simple Foot Form Diabetic Foot exam was performed with the following findings:  Yes   Visual Inspection No deformities, no ulcerations, no other skin breakdown bilaterally:  Yes See comments:  Yes Sensation Testing Intact to touch and monofilament testing bilaterally:  Yes Pulse Check See comments:  Yes Comments Pedal pulses absent on the right, left dorsalis pedis.  Is 1+ Superficial veins present, some edema on the right, mild stasis pigmentation      ASSESSMENT/ PLAN:   Diabetes type 2 with obesity  See history of present illness for detailed discussion of his current management, blood sugar patterns and problems identified  With a regimen of Victoza, metformin, NPH insulin at bedtime and Humalog at suppertime his blood sugars are fairly well controlled He does have periodic high readings after evening meal if he is eating either  larger meals, more of 100 or high-fat content He does not understand well the concept of adjusting his mealtime doses based on what he is eating Also at times may need more than 8 units of insulin to cover his meals  The patient's diabetes control appears to be overall fair with occasional postprandial hyperglycemia especially when he is going overboard with  portions of carbohydrates He had difficulty exercising and losing weight Recommendations:  Adjust mealtime dose at dinnertime based on type of meals, size of meal and carbohydrate/fat intake  Reduce NPH down to 24 at bedtime since fasting readings may tend to be low normal at times  Resume exercise when able to  No significant diabetic complications seen. Encouraged annual eye exams and microalbumin to be checked regularly   HYPERLIPIDEMIA: He has a better LDL now below 100 with his pravastatin 80 mg daily  Sleep apnea, followed by pulmonologist, patient is able to use his CPAP.   He does need to continue treatment indefinitely  HYPERTENSION: Blood pressure is  higher now with stopping his amlodipine No orthostatic function and renal function normal recently  ANEMIA: Not clear this is related to chronic disease, iron saturation slightly low He would try taking vitamin C with his iron and take this daily, follow-up in 3 months Also he will do stool Hemoccults  PREVENTIVE care:  Stool Hemoccult given He is up-to-date with vaccinations, annual eye exams and foot exam He will complete living will and POA that has been given to him before  Osteoarthritis right knee: He has more inflammatory changes with fusion and He will follow-up with his orthopedic Surgeon   [facility] 05/06/2017, 10:24 AM

## 2017-05-16 ENCOUNTER — Telehealth: Payer: Self-pay | Admitting: Endocrinology

## 2017-05-16 NOTE — Telephone Encounter (Signed)
05/06/17

## 2017-05-16 NOTE — Telephone Encounter (Signed)
Please advise 

## 2017-05-16 NOTE — Telephone Encounter (Signed)
[employer] called to check the status of the note below. I advised that Dr. Dwyane Dee has not responded. [employer] advised that they would call again at 4:30pm.

## 2017-05-16 NOTE — Telephone Encounter (Signed)
Patient needs to know the date of his AWV?  Please call back as soon as possible to notify him if he's had it w/ Dr. Dwyane Dee already so he can get his incentives from [facility].    Ty,  -LL

## 2017-05-17 NOTE — Telephone Encounter (Signed)
[employer] has been made aware. No further action required.

## 2017-06-01 ENCOUNTER — Encounter: Payer: Self-pay | Admitting: Endocrinology

## 2017-06-01 LAB — HM DIABETES EYE EXAM

## 2017-06-06 ENCOUNTER — Encounter: Payer: Self-pay | Admitting: Endocrinology

## 2017-06-07 ENCOUNTER — Other Ambulatory Visit: Payer: Self-pay

## 2017-06-07 ENCOUNTER — Other Ambulatory Visit (INDEPENDENT_AMBULATORY_CARE_PROVIDER_SITE_OTHER): Payer: Medicare Other

## 2017-06-07 DIAGNOSIS — Z1211 Encounter for screening for malignant neoplasm of colon: Secondary | ICD-10-CM

## 2017-06-09 LAB — FECAL OCCULT BLOOD, IMMUNOCHEMICAL: Fecal Occult Bld: NEGATIVE

## 2017-07-01 ENCOUNTER — Other Ambulatory Visit: Payer: Self-pay | Admitting: Endocrinology

## 2017-07-01 ENCOUNTER — Other Ambulatory Visit: Payer: Medicare Other

## 2017-07-01 ENCOUNTER — Other Ambulatory Visit (INDEPENDENT_AMBULATORY_CARE_PROVIDER_SITE_OTHER): Payer: Medicare Other

## 2017-07-01 DIAGNOSIS — Z794 Long term (current) use of insulin: Secondary | ICD-10-CM | POA: Diagnosis not present

## 2017-07-01 DIAGNOSIS — E1165 Type 2 diabetes mellitus with hyperglycemia: Secondary | ICD-10-CM

## 2017-07-01 DIAGNOSIS — D508 Other iron deficiency anemias: Secondary | ICD-10-CM

## 2017-07-01 LAB — CBC WITH DIFFERENTIAL/PLATELET
Basophils Absolute: 0 10*3/uL (ref 0.0–0.1)
Basophils Relative: 0.5 % (ref 0.0–3.0)
EOS ABS: 0.1 10*3/uL (ref 0.0–0.7)
Eosinophils Relative: 1.8 % (ref 0.0–5.0)
HCT: 36.4 % — ABNORMAL LOW (ref 39.0–52.0)
HEMOGLOBIN: 11.9 g/dL — AB (ref 13.0–17.0)
LYMPHS ABS: 2 10*3/uL (ref 0.7–4.0)
Lymphocytes Relative: 27.1 % (ref 12.0–46.0)
MCHC: 32.6 g/dL (ref 30.0–36.0)
MCV: 93.5 fl (ref 78.0–100.0)
MONO ABS: 0.8 10*3/uL (ref 0.1–1.0)
Monocytes Relative: 10.6 % (ref 3.0–12.0)
NEUTROS PCT: 60 % (ref 43.0–77.0)
Neutro Abs: 4.5 10*3/uL (ref 1.4–7.7)
Platelets: 153 10*3/uL (ref 150.0–400.0)
RBC: 3.9 Mil/uL — AB (ref 4.22–5.81)
RDW: 14.5 % (ref 11.5–15.5)
WBC: 7.5 10*3/uL (ref 4.0–10.5)

## 2017-07-01 LAB — BASIC METABOLIC PANEL
BUN: 27 mg/dL — ABNORMAL HIGH (ref 6–23)
CHLORIDE: 103 meq/L (ref 96–112)
CO2: 27 meq/L (ref 19–32)
Calcium: 9.5 mg/dL (ref 8.4–10.5)
Creatinine, Ser: 1 mg/dL (ref 0.40–1.50)
GFR: 76.17 mL/min (ref >60.00)
Glucose, Bld: 111 mg/dL — ABNORMAL HIGH (ref 70–99)
POTASSIUM: 4.3 meq/L (ref 3.5–5.1)
Sodium: 136 mEq/L (ref 135–145)

## 2017-07-01 LAB — IBC PANEL
IRON: 60 ug/dL (ref 42–165)
SATURATION RATIOS: 17.8 % — AB (ref 20.0–50.0)
TRANSFERRIN: 241 mg/dL (ref 212.0–360.0)

## 2017-07-02 LAB — FRUCTOSAMINE: FRUCTOSAMINE: 262 umol/L (ref 0–285)

## 2017-07-05 ENCOUNTER — Ambulatory Visit (INDEPENDENT_AMBULATORY_CARE_PROVIDER_SITE_OTHER): Payer: Medicare Other | Admitting: Endocrinology

## 2017-07-05 ENCOUNTER — Encounter: Payer: Self-pay | Admitting: Endocrinology

## 2017-07-05 VITALS — BP 136/82 | HR 64 | Ht 69.0 in | Wt 239.6 lb

## 2017-07-05 DIAGNOSIS — I10 Essential (primary) hypertension: Secondary | ICD-10-CM

## 2017-07-05 DIAGNOSIS — Z794 Long term (current) use of insulin: Secondary | ICD-10-CM

## 2017-07-05 DIAGNOSIS — D508 Other iron deficiency anemias: Secondary | ICD-10-CM

## 2017-07-05 DIAGNOSIS — Z23 Encounter for immunization: Secondary | ICD-10-CM

## 2017-07-05 DIAGNOSIS — E1165 Type 2 diabetes mellitus with hyperglycemia: Secondary | ICD-10-CM

## 2017-07-05 NOTE — Progress Notes (Signed)
Patient ID: Todd Romero, male   DOB: 09/29/1933, 83 y.o.   MRN: [id number]    Reason for Appointment:   management of chronic problems    History of Present Illness   Diagnosis: Type 2 DIABETES MELITUS, date of diagnosis:  1997   He has been on various regimens for his diabetes and has been on bedtime insulin with NPH since 2005 He also has benefited from adding Victoza in 2011 with better postprandial control and some weight loss Previously his weight has been as much as 247 pounds  On Victoza  since 11/14 with further improvement in blood sugar control. Since about 2015 his blood sugars have been overall mildly high with A1c over 7% consistently.  RECENT HISTORY:  On the last visit A1c is better at 7.3, in February was 7.8 Fructosamine is normal at 262  Oral hypoglycemic drugs: Amaryl 1 mg in the a.m. and metformin 500 mg twice a day         Side effects from medications: None  Insulin regimen: NPH 24 units at bedtime and Humalog 10-12 units at suppertime    Current management, blood sugar patterns and problems identified:  Humalog was increased on his last visit when he was taking only up to 8 units and his blood sugars are higher after his evening meal fairly often and averaging 175  He does have better blood sugar control after his evening meal and average blood sugars improved  However even sporadically have a higher reading after lunch even though he thinks he will usually take Humalog at lunch if this is his main meal  FASTING blood sugars are near normal and overall similar to the last time despite reducing the NPH by 2 units at bedtime  Not checking blood sugars after breakfast and only occasionally after lunch  Does take his insulin and Victoza injections consistently as directed  Monitors blood glucose:about 2x a day.    Glucometer:  Accu-Chek         Blood Glucose readings from meter download:   Mean values apply above for all meters except  median for One Touch  PRE-MEAL Fasting Lunch Dinner Bedtime Overall  Glucose range: 89-1 33    10 9-217    Mean/median: 107  165 158 133    Physical activity: exercise: Unable to do much activity because of  knee pain  Meal times: Dinner about 5-7 PM  Wt Readings from Last 3 Encounters:  07/05/17 239 lb 9.6 oz (108.7 kg)  05/06/17 238 lb (108 kg)  04/20/17 231 lb 3.2 oz (104.9 kg)   LABS: Lab Results  Component Value Date   HGBA1C 7.3 (H) 05/02/2017   HGBA1C 7.4 (H) 03/11/2017   HGBA1C 7.8 (H) 10/29/2016   Lab Results  Component Value Date   MICROALBUR 4.2 (H) 10/29/2016   LDLCALC 91 05/02/2017   CREATININE 1.00 07/01/2017       OTHER active problems are discussed in review of systems  Lab on 07/01/2017  Component Date Value Ref Range Status  . Sodium 07/01/2017 136  135 - 145 mEq/L Final  . Potassium 07/01/2017 4.3  3.5 - 5.1 mEq/L Final  . Chloride 07/01/2017 103  96 - 112 mEq/L Final  . CO2 07/01/2017 27  19 - 32 mEq/L Final  . Glucose, Bld 07/01/2017 111* 70 - 99 mg/dL Final  . BUN 07/01/2017 27* 6 - 23 mg/dL Final  . Creatinine, Ser 07/01/2017 1.00  0.40 - 1.50 mg/dL Final  .  Calcium 07/01/2017 9.5  8.4 - 10.5 mg/dL Final  . GFR 07/01/2017 76.17  >60.00 mL/min Final  . Iron 07/01/2017 60  42 - 165 ug/dL Final  . Transferrin 07/01/2017 241.0  212.0 - 360.0 mg/dL Final  . Saturation Ratios 07/01/2017 17.8* 20.0 - 50.0 % Final  . WBC 07/01/2017 7.5  4.0 - 10.5 K/uL Final  . RBC 07/01/2017 3.90* 4.22 - 5.81 Mil/uL Final  . Hemoglobin 07/01/2017 11.9* 13.0 - 17.0 g/dL Final  . HCT 07/01/2017 36.4* 39.0 - 52.0 % Final  . MCV 07/01/2017 93.5  78.0 - 100.0 fl Final  . MCHC 07/01/2017 32.6  30.0 - 36.0 g/dL Final  . RDW 07/01/2017 14.5  11.5 - 15.5 % Final  . Platelets 07/01/2017 153.0  150.0 - 400.0 K/uL Final  . Neutrophils Relative % 07/01/2017 60.0  43.0 - 77.0 % Final  . Lymphocytes Relative 07/01/2017 27.1  12.0 - 46.0 % Final  . Monocytes Relative  07/01/2017 10.6  3.0 - 12.0 % Final  . Eosinophils Relative 07/01/2017 1.8  0.0 - 5.0 % Final  . Basophils Relative 07/01/2017 0.5  0.0 - 3.0 % Final  . Neutro Abs 07/01/2017 4.5  1.4 - 7.7 K/uL Final  . Lymphs Abs 07/01/2017 2.0  0.7 - 4.0 K/uL Final  . Monocytes Absolute 07/01/2017 0.8  0.1 - 1.0 K/uL Final  . Eosinophils Absolute 07/01/2017 0.1  0.0 - 0.7 K/uL Final  . Basophils Absolute 07/01/2017 0.0  0.0 - 0.1 K/uL Final  . Fructosamine 07/01/2017 262  0 - 285 umol/L Final   Comment: Published reference interval for apparently healthy subjects between age 90 and 39 is 71 - 285 umol/L and in a poorly controlled diabetic population is 228 - 563 umol/L with a mean of 396 umol/L.     Allergies as of 07/05/2017      Reactions   Morphine And Related Nausea And Vomiting      Medication List       Accurate as of 07/05/17  9:50 AM. Always use your most recent med list.          ACCU-CHEK AVIVA PLUS test strip Generic drug:  glucose blood USE AS INSTRUCTED TO CHECK BLOOD SUGAR TWICE A DAY   acetaminophen 500 MG tablet Commonly known as:  TYLENOL Take 500 mg by mouth every 4 (four) hours as needed for moderate pain or headache.   aspirin EC 81 MG tablet Take 81 mg by mouth daily.   B-D INS SYR ULTRAFINE 1CC/31G 31G X 5/16" 1 ML Misc Generic drug:  Insulin Syringe-Needle U-100 USE TO INJECT INSULIN DAILY   clopidogrel 75 MG tablet Commonly known as:  PLAVIX Take 1 tablet (75 mg total) by mouth daily with breakfast.   ferrous sulfate 325 (65 FE) MG tablet Take 325 mg by mouth daily with breakfast.   fish oil-omega-3 fatty acids 1000 MG capsule Take 1 g by mouth daily at 12 noon.   furosemide 20 MG tablet Commonly known as:  LASIX Take 1 tablet (20 mg total) by mouth daily.   glimepiride 2 MG tablet Commonly known as:  AMARYL Take 0.5 tablets (1 mg total) by mouth daily.   insulin lispro 100 UNIT/ML KiwkPen Commonly known as:  HUMALOG KWIKPEN 4-8 units before  the main meals of the day   insulin NPH Human 100 UNIT/ML injection Commonly known as:  HUMULIN N,NOVOLIN N Inject 0.24 mLs (24 Units total) into the skin at bedtime. Uses Vial   Insulin  Pen Needle 31G X 5 MM Misc Commonly known as:  B-D UF III MINI PEN NEEDLES USE 2 PEN NEEDLE PER DAY WITH HUMALOG AND VICTOZA   liraglutide 18 MG/3ML Sopn Inject 0.3 mLs (1.8 mg total) into the skin at bedtime.   metFORMIN 500 MG (MOD) 24 hr tablet Commonly known as:  GLUMETZA Take 1 tablet (500 mg total) by mouth 2 (two) times daily with a meal. (morning & noon)   multivitamin with minerals Tabs tablet Take 1 tablet by mouth daily.   olmesartan 40 MG tablet Commonly known as:  BENICAR Take 1 tablet (40 mg total) by mouth daily.   pravastatin 80 MG tablet Commonly known as:  PRAVACHOL Take 1 tablet (80 mg total) by mouth every evening.   PRESERVISION AREDS 2 PO Take 1 capsule by mouth 2 (two) times daily.   tamsulosin 0.4 MG Caps capsule Commonly known as:  FLOMAX Take 0.4 mg by mouth.   vitamin C 500 MG tablet Commonly known as:  ASCORBIC ACID Take 500 mg by mouth daily.       Allergies:  Allergies  Allergen Reactions  . Morphine And Related Nausea And Vomiting    Past Medical History:  Diagnosis Date  . Anemia   . Arthritis   . CAD (coronary artery disease)    a. cardiac cath 12/2016 showing moderate AS, elevated LVEDP, heavy 3V coronary calcification, 100% mD2, 30-50% LAD, 50-90% stenosis of Cx proximal to origin of L-PDA, RCA not engaged due to poor catheter control (difficult procedure) - coronary status was essentially unchanged from prior.  . Chronic diastolic CHF (congestive heart failure) ([place])   . Degenerative arthritis   . Diabetes ([place])   . Dyspnea   . Essential hypertension   . Hyperlipidemia   . Obesity (BMI 30-39.9) 06/18/2015  . OSA (obstructive sleep apnea)    Severe with AHI 27/hr now on CPAP  no cpap  . Pure hypercholesterolemia   . RBBB   . [facility] spotted fever   . 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 Severe aortic stenosis    a. s/p TAVR 03/2017.  . Type II or unspecified type diabetes mellitus without mention of complication, not stated as uncontrolled     Past Surgical History:  Procedure Laterality Date  . APPENDECTOMY    . CARPAL TUNNEL RELEASE    . KNEE CARTILAGE SURGERY    . PARTIAL COLECTOMY    . RIGHT/LEFT HEART CATH AND CORONARY ANGIOGRAPHY N/A 12/28/2016   Procedure: Right/Left Heart Cath and Coronary Angiography;  Surgeon: Belva Crome, MD;  Location: [place] [facility];  Service: Cardiovascular;  Laterality: N/A;  . TEE WITHOUT CARDIOVERSION N/A 03/15/2017   Procedure: TRANSESOPHAGEAL ECHOCARDIOGRAM (TEE);  Surgeon: Burnell Blanks, MD;  Location: [place];  Service: Open Heart Surgery;  Laterality: N/A;  . TRANSCATHETER AORTIC VALVE REPLACEMENT, TRANSFEMORAL N/A 03/15/2017   Procedure: TRANSCATHETER AORTIC VALVE REPLACEMENT, TRANSFEMORAL;  Surgeon: Burnell Blanks, MD;  Location: [place];  Service: Open Heart Surgery;  Laterality: N/A;  . VEIN SURGERY      Family History  Problem Relation Age of Onset  . Heart failure Father   . Emphysema Father   . Hypertension Mother   . Cancer Sister   . Healthy Sister     Social History:  reports that he has never smoked. He has never used smokeless  tobacco. He reports that he does not drink alcohol or use drugs.  Review of Systems:  HYPERTENSION:   Currently being treated with Benicar He has  checked blood pressure regularly at home   CARDIAC history:  Doing better with shortness of breath on exertion, this has improved since his aortic valve replacement He is now only taking 30 mg Lasix, previously on as much as 80 mg   HYPERLIPIDEMIA: The lipid abnormality consists of elevated LDL and he is taking 40 mg Pravachol, no  side effects. LDL is above 100  Lab Results  Component Value Date   CHOL 175 05/02/2017   HDL 46.60 05/02/2017   LDLCALC 91 05/02/2017   LDLDIRECT 101.0 04/26/2016   TRIG 184.0 (H) 05/02/2017   CHOLHDL 4 05/02/2017    History of daytime fatigability and somnolence Secondary to sleep apnea.   He is mostly compliant with his CPAP treatment   IRON deficiency anemia:   His hemoglobin had dropped to 11.4 even with taking iron supplement at home usually He has been told to take vitamin C along with the iron and now hemoglobin is improving although iron saturation still low at about 17%  No history of B-12 deficiency   CBC Latest Ref Rng & Units 07/01/2017 05/02/2017 03/30/2017  WBC 4.0 - 10.5 K/uL 7.5 7.7 10.8  Hemoglobin 13.0 - 17.0 g/dL 11.9(L) 11.4(L) 11.4(L)  Hematocrit 39.0 - 52.0 % 36.4(L) 34.9(L) 32.7(L)  Platelets 150.0 - 400.0 K/uL 153.0 155.0 222   Foot exam last done in 04/2017  ROS    Physical Exam    BP 136/82   Pulse 64   Ht 5\' 9"  (1.753 m)   Wt 239 lb 9.6 oz (108.7 kg)   SpO2 98%   BMI 35.38 kg/m    ASSESSMENT/ PLAN:   Diabetes type 2 with obesity  See history of present illness for detailed discussion of his current management, blood sugar patterns and problems identified  Fructosamine is 262, fairly good Previous A1c 7.3  With a regimen of Victoza, metformin, NPH insulin at bedtime and Humalog at his main meal his blood sugars are fairly well controlled He is doing better with increasing his Humalog and have discussed his postprandial blood sugar targets also He will continue his same regimen   HYPERTENSION: Blood pressure is  Better controlled now   ANEMIA:   This is related to iron deficiency and is better with adding vitamin C to his iron supplements and he will continue  PREVENTIVE care:  Stool Hemoccult was negative   He will complete living will and POA which he has not had motorized as yet  Influenza vaccine  given   [facility] 07/05/2017, 9:50 AM

## 2017-08-19 ENCOUNTER — Other Ambulatory Visit: Payer: Self-pay

## 2017-08-19 ENCOUNTER — Telehealth: Payer: Self-pay | Admitting: Endocrinology

## 2017-08-19 MED ORDER — INSULIN NPH (HUMAN) (ISOPHANE) 100 UNIT/ML ~~LOC~~ SUSP: 26.0000 [IU] | Freq: Every day | SUBCUTANEOUS | 3 refills | Status: DC

## 2017-08-19 NOTE — Telephone Encounter (Signed)
This medication has been sent to [facility].

## 2017-08-19 NOTE — Telephone Encounter (Signed)
Patient needs script for Humalin sent to [facility]

## 2017-09-19 ENCOUNTER — Telehealth: Payer: Self-pay | Admitting: *Deleted

## 2017-09-19 ENCOUNTER — Other Ambulatory Visit: Payer: Self-pay

## 2017-09-19 ENCOUNTER — Telehealth: Payer: Self-pay | Admitting: Endocrinology

## 2017-09-19 MED ORDER — TAMSULOSIN HCL 0.4 MG PO CAPS: ORAL_CAPSULE | ORAL | 1 refills | Status: DC

## 2017-09-19 MED ORDER — PRAVASTATIN SODIUM 80 MG PO TABS: 80.0000 mg | ORAL_TABLET | Freq: Every evening | ORAL | 1 refills | Status: DC

## 2017-09-19 NOTE — Telephone Encounter (Signed)
Patient needs 2 RX's for 1. Pravastatin -80 mg; AND 2. Tamsulosim sent to [facility]

## 2017-09-19 NOTE — Telephone Encounter (Signed)
   [facility] Pre-operative Risk Assessment    Request for surgical clearance:  1. What type of surgery is being performed? Dental Cleaning/ or Procedures  2. When is this surgery scheduled? Pending Clearance  3. Are there any medications that need to be held prior to surgery and how long? Advise on cardiac med clearance and if prophylactic antibiotic premedication before dental treatments  4. Practice name and name of physician performing surgery? Amy F. Newton, D.D.S., PA  5. What is your office phone and fax number? P# [phone], F# [phone]  6. Anesthesia type (None, local, MAC, general) ? Not Mentioned   Marlis Edelson 09/19/2017, 2:09 PM  _________________________________________________________________   (provider comments below)

## 2017-09-19 NOTE — Telephone Encounter (Signed)
This has been sent

## 2017-09-20 ENCOUNTER — Encounter: Payer: Self-pay | Admitting: Adult Health

## 2017-09-20 NOTE — Progress Notes (Signed)
Pt has appt for clearance w/Dr Tamala Julian 10-30-17

## 2017-09-20 NOTE — Telephone Encounter (Signed)
Pre op pool contacted to address

## 2017-09-20 NOTE — Progress Notes (Unsigned)
   Primary Cardiologist:No primary care provider on file.  Chart reviewed as part of pre-operative protocol coverage. Because of Todd Romero's past medical history and time since last visit, he/she will require a follow-up visit in order to better assess preoperative cardiovascular risk.  He has not been seen since July 2018, and per protocol if it is been greater than 6 months he will need to be seen..  Pre-op covering staff: - Please schedule appointment and call patient to inform them. - Please contact requesting surgeon's office via preferred method (i.e, phone, fax) to inform them of need for appointment prior to surgery.  [name] DNP   09/20/2017, 3:38 PM

## 2017-09-20 NOTE — Telephone Encounter (Signed)
Follow up   Patient is being seen 09/21/17 , needs response back today.

## 2017-09-21 NOTE — Telephone Encounter (Signed)
Follow up     Patient is at the office for appt right now , needs a response asap for dental clearance

## 2017-09-21 NOTE — Telephone Encounter (Signed)
Lm2cb pt needs appt for clearance

## 2017-09-21 NOTE — Telephone Encounter (Signed)
S/w pt states needs lots of dental work, pt needs work up for pre-op clearance. Made sooner appt with APP and kept upcoming doctors appt in system.

## 2017-09-21 NOTE — Telephone Encounter (Signed)
   Primary Cardiologist:No primary care provider on file.  Chart reviewed as part of pre-operative protocol coverage. Because of Todd Romero's past medical history and time since last visit, he/she will require a follow-up visit in order to better assess preoperative cardiovascular risk.  He has not been seen since July 2018, and per protocol if it is been greater than 6 months he will need to be seen..  Pre-op covering staff: - Please schedule appointment and call patient to inform them. - Please contact requesting surgeon's office via preferred method (i.e, phone, fax) to inform them of need for appointment prior to surgery.  Todd Sims DNP   09/20/2017, 3:38 PM

## 2017-09-21 NOTE — Telephone Encounter (Signed)
Notified [facility] pt needs appt for clearance they will also call pt

## 2017-09-27 ENCOUNTER — Telehealth: Payer: Self-pay

## 2017-09-27 NOTE — Telephone Encounter (Addendum)
   [facility] Pre-operative Risk Assessment    Request for surgical clearance:  1. What type of surgery is being performed? Right total knee replacement  2. When is this surgery scheduled? Pending clearance (11-08-2017?)  3. Are there any medications that need to be held prior to surgery and how long?    1. Stop Plavis 7 days prior   2. Patient also on aspirin but not indicated if he should stop prior to surgery.  4. Practice name and name of physician performing surgery? [facility] [place], [place]  5. What is your office phone and fax number? Phone: [phone] Fax: [phone]  6. Anesthesia type (None, local, MAC, general) ? N/A   Velna Ochs 09/27/2017, 9:51 AM  _________________________________________________________________   (provider comments below)

## 2017-09-28 NOTE — Telephone Encounter (Signed)
To be honest I am not sure why he is on Plavix.  I do not see where he had stent implantation recently.  He did have to have TAVR performed.  Perhaps Plavix was started after the TAVR procedure.  I would check with [place] about Plavix discontinuation or holding

## 2017-09-28 NOTE — Telephone Encounter (Signed)
Has appointment tomorrow with Cecilie Kicks, NP for pre op clearance.   Burtis Junes, RN, [place] [address] [place],   [phone]

## 2017-09-29 ENCOUNTER — Encounter: Payer: Self-pay | Admitting: Cardiology

## 2017-09-29 ENCOUNTER — Encounter (INDEPENDENT_AMBULATORY_CARE_PROVIDER_SITE_OTHER): Payer: Self-pay

## 2017-09-29 ENCOUNTER — Ambulatory Visit (INDEPENDENT_AMBULATORY_CARE_PROVIDER_SITE_OTHER): Payer: Medicare Other | Admitting: Cardiology

## 2017-09-29 VITALS — BP 148/52 | HR 57 | Ht 68.0 in | Wt 241.1 lb

## 2017-09-29 DIAGNOSIS — Z01818 Encounter for other preprocedural examination: Secondary | ICD-10-CM | POA: Diagnosis not present

## 2017-09-29 DIAGNOSIS — I2510 Atherosclerotic heart disease of native coronary artery without angina pectoris: Secondary | ICD-10-CM

## 2017-09-29 DIAGNOSIS — I10 Essential (primary) hypertension: Secondary | ICD-10-CM

## 2017-09-29 DIAGNOSIS — Z952 Presence of prosthetic heart valve: Secondary | ICD-10-CM

## 2017-09-29 DIAGNOSIS — I5032 Chronic diastolic (congestive) heart failure: Secondary | ICD-10-CM | POA: Diagnosis not present

## 2017-09-29 NOTE — Progress Notes (Signed)
Cardiology Office Note   Date:  09/29/2017   ID:  Todd Romero, DOB 1934-03-30, MRN [id number]  PCP:  [name], MD  Cardiologist:  Dr. Tamala Julian  TAVR  Dr. Angelena Form    Chief Complaint  Patient presents with  . Pre-op Exam    for dental work and Rt total knee      History of Present Illness: Todd Romero is a 84 y.o. male who presents for pre-op clearance for Rt total knee and fillings and crown on tooth.   He has a history of severe aortic valve stenosis, CAD, DM, HLD and HTN who underwent TAVR on 03/15/17 and had a 29 mm Edwards Sapien 3 bioprosthetic AVR placed from the right transfemoral artery. Chronic diastolic HF.  CAD with CTO of D1 and 90% LCX (cath 2013),   Last cardiac cath 12/28/16 with unchanged status compared to 5 years ago.  30-50% stenosis in LAD, 50-90% stenosis prox to origin of the L-PDA.    Today he presents with need for dental fillings and crown and then in March for Rt total knee, it does cause chronic pain. He denies chest pain though after putting decorations up he developed Lt chest soreness that did not last long.  With his walking for exercise he does not have chest pain.  He can walk up 2 flights of steps with chest pain or SOB. Though it may be slow due to the knee.       Past Medical History:  Diagnosis Date  . Anemia   . Arthritis   . CAD (coronary artery disease)    a. cardiac cath 12/2016 showing moderate AS, elevated LVEDP, heavy 3V coronary calcification, 100% mD2, 30-50% LAD, 50-90% stenosis of Cx proximal to origin of L-PDA, RCA not engaged due to poor catheter control (difficult procedure) - coronary status was essentially unchanged from prior.  . Chronic diastolic CHF (congestive heart failure) ([place])   . Degenerative arthritis   . Diabetes ([place])   . Dyspnea   . Essential hypertension   . Hyperlipidemia   . Obesity (BMI 30-39.9) 06/18/2015  . OSA (obstructive sleep apnea)    Severe with AHI 27/hr now on CPAP  no cpap  .  Pure hypercholesterolemia   . RBBB   . [facility] [place] spotted fever   . 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 Severe aortic stenosis    a. s/p TAVR 03/2017.  . Type II or unspecified type diabetes mellitus without mention of complication, not stated as uncontrolled     Past Surgical History:  Procedure Laterality Date  . APPENDECTOMY    . CARPAL TUNNEL RELEASE    . KNEE CARTILAGE SURGERY    . PARTIAL COLECTOMY    . RIGHT/LEFT HEART CATH AND CORONARY ANGIOGRAPHY N/A 12/28/2016   Procedure: Right/Left Heart Cath and Coronary Angiography;  Surgeon: Belva Crome, MD;  Location: [place] [facility];  Service: Cardiovascular;  Laterality: N/A;  . TEE WITHOUT CARDIOVERSION N/A 03/15/2017   Procedure: TRANSESOPHAGEAL ECHOCARDIOGRAM (TEE);  Surgeon: Burnell Blanks, MD;  Location: [place];  Service: Open Heart Surgery;  Laterality: N/A;  . TRANSCATHETER AORTIC VALVE REPLACEMENT, TRANSFEMORAL N/A 03/15/2017   Procedure: TRANSCATHETER AORTIC VALVE REPLACEMENT, TRANSFEMORAL;  Surgeon: Burnell Blanks, MD;  Location: [place];  Service: Open Heart Surgery;  Laterality: N/A;  . VEIN SURGERY  Current Outpatient Medications  Medication Sig Dispense Refill  . ACCU-CHEK AVIVA PLUS test strip USE AS INSTRUCTED TO CHECK BLOOD SUGAR TWICE A DAY 200 each 2  . acetaminophen (TYLENOL) 500 MG tablet Take 500 mg by mouth every 4 (four) hours as needed for moderate pain or headache.    [name] aspirin EC 81 MG tablet Take 81 mg by mouth daily.    . B-D INS SYR ULTRAFINE 1CC/31G 31G X 5/16" 1 ML MISC USE TO INJECT INSULIN DAILY 100 each 3  . clopidogrel (PLAVIX) 75 MG tablet Take 1 tablet (75 mg total) by mouth daily with breakfast. 90 tablet 1  . ferrous sulfate 325 (65 FE) MG tablet Take 325 mg by mouth daily with breakfast.    . fish oil-omega-3 fatty acids 1000 MG capsule  Take 1 g by mouth daily at 12 noon.     [name] glimepiride (AMARYL) 2 MG tablet Take 0.5 tablets (1 mg total) by mouth daily. 45 tablet 3  . insulin lispro (HUMALOG KWIKPEN) 100 UNIT/ML KiwkPen 4-8 units before the main meals of the day (Patient taking differently: Inject 8-14 Units into the skin 3 (three) times daily. 8-14 units before the main meals of the day) 15 mL 1  . insulin NPH Human (HUMULIN N,NOVOLIN N) 100 UNIT/ML injection Inject 0.26 mLs (26 Units total) into the skin at bedtime. Uses Vial 20 mL 3  . Insulin Pen Needle (B-D UF III MINI PEN NEEDLES) 31G X 5 MM MISC USE 2 PEN NEEDLE PER DAY WITH HUMALOG AND VICTOZA 200 each 3  . liraglutide 18 MG/3ML SOPN Inject 0.3 mLs (1.8 mg total) into the skin at bedtime. 15 pen 2  . metFORMIN (GLUMETZA) 500 MG (MOD) 24 hr tablet Take 1 tablet (500 mg total) by mouth 2 (two) times daily with a meal. (morning & noon) 180 tablet 4  . Multiple Vitamin (MULTIVITAMIN WITH MINERALS) TABS tablet Take 1 tablet by mouth daily.    . Multiple Vitamins-Minerals (PRESERVISION AREDS 2 PO) Take 1 capsule by mouth 2 (two) times daily.    [name] olmesartan (BENICAR) 40 MG tablet Take 1 tablet (40 mg total) by mouth daily. 90 tablet 3  . pravastatin (PRAVACHOL) 80 MG tablet Take 1 tablet (80 mg total) by mouth every evening. 90 tablet 1  . tamsulosin (FLOMAX) 0.4 MG CAPS capsule Take 1 capsule daily 90 capsule 1  . vitamin C (ASCORBIC ACID) 500 MG tablet Take 500 mg by mouth daily.    . furosemide (LASIX) 20 MG tablet Take 1 tablet (20 mg total) by mouth daily. 90 tablet 3   No current facility-administered medications for this visit.     Allergies:   Morphine and related    Social History:  The patient  reports that  has never smoked. he has never used smokeless tobacco. He reports that he does not drink alcohol or use drugs.   Family History:  The patient's family history includes Cancer in his sister; Emphysema in his father; Healthy in his sister; Heart failure in his  father; Hypertension in his mother.    ROS:  General:no colds or fevers, no weight changes Skin:no rashes or ulcers HEENT:no blurred vision, no congestion CV:see HPI PUL:see HPI GI:no diarrhea constipation or melena, no indigestion GU:no hematuria, no dysuria MS:no joint pain, no claudication Neuro:no syncope, no lightheadedness Endo:+ diabetes, no thyroid disease  Wt Readings from Last 3 Encounters:  09/29/17 241 lb 1.9 oz (109.4 kg)  07/05/17 239 lb 9.6 oz (  108.7 kg)  05/06/17 238 lb (108 kg)     PHYSICAL EXAM: VS:  BP (!) 148/52   Pulse (!) 57   Ht 5\' 8"  (1.727 m)   Wt 241 lb 1.9 oz (109.4 kg)   SpO2 97%   BMI 36.66 kg/m  , BMI Body mass index is 36.66 kg/m. General:Pleasant affect, NAD Skin:Warm and dry, brisk capillary refill HEENT:normocephalic, sclera clear, mucus membranes moist Neck:supple, no JVD, no bruits  Heart:S1S2 RRR without murmur, gallup, rub or click Lungs:clear without rales, rhonchi, or wheezes AOZ:HYQM, non tender, + BS, do not palpate liver spleen or masses Ext:Rt ankle with 1-2+ edema chronic and Lt lower ext edema, 1+ edema,  2+ pedal pulses, 2+ radial pulses Neuro:alert and oriented X 3, MAE, follows commands, + facial symmetry    EKG:  EKG is ordered today. The ekg ordered today demonstrates SB at 70 with 1st degree AV block LAD, RBBB PR 296 ms    Recent Labs: 10/21/2016: NT-Pro BNP 399 03/30/2017: Magnesium 2.3 05/02/2017: ALT 13 07/01/2017: BUN 27; Creatinine, Ser 1.00; Hemoglobin 11.9; Platelets 153.0; Potassium 4.3; Sodium 136    Lipid Panel    Component Value Date/Time   CHOL 175 05/02/2017 0855   TRIG 184.0 (H) 05/02/2017 0855   HDL 46.60 05/02/2017 0855   CHOLHDL 4 05/02/2017 0855   VLDL 36.8 05/02/2017 0855   LDLCALC 91 05/02/2017 0855   LDLDIRECT 101.0 04/26/2016 0915       Other studies Reviewed: Additional studies/ records that were reviewed today include: . Echo 04/20/17 Study Conclusions  - Left ventricle:  Wall thickness was increased in a pattern of mild   LVH. Systolic function was normal. The estimated ejection   fraction was in the range of 55% to 60%. Wall motion was normal;   there were no regional wall motion abnormalities. Features are   consistent with a pseudonormal left ventricular filling pattern,   with concomitant abnormal relaxation and increased filling   pressure (grade 2 diastolic dysfunction). - Aortic valve: A bioprosthesis was present. Valve area (VTI): 1.9   cm^2. Valve area (Vmax): 1.86 cm^2. Valve area (Vmean): 1.71   cm^2. - Mitral valve: Mildly calcified annulus. - Left atrium: The atrium was moderately dilated. - Right atrium: The atrium was mildly dilated.  Echo 03/2017 Study Conclusions  - Left ventricle: The cavity size was normal. Wall thickness was   increased in a pattern of mild LVH. The estimated ejection   fraction was 55%. Left ventricular diastolic function parameters   were normal. - Aortic valve: Post implant 29 mm Sapien 3 valve with very trivial   peri valvular regurgitation at base of anterior mitral leaflet   Valve area (VTI): 1.76 cm^2. Valve area (Vmax): 1.64 cm^2. Valve   area (Vmean): 1.75 cm^2. - Mitral valve: Calcified annulus. Valve area by pressure   half-time: 2.39 cm^2. - Atrial septum: No defect or patent foramen ovale was identified. - Pulmonary arteries: PA peak pressure: 40 mm Hg (S).  ASSESSMENT AND PLAN:  1.  Pre-op exam  For dental procedure ok to stop Plavix for 5 days. + dental prophylaxis with TAVR For total Rt knee pt is 6.6% risk but his CAD has been stable.  His ASA and plavix may be stopped for 7 days for procedure but resume soon afterwards.  I discussed ASA and plavix with both Dr. Tamala Julian and Dr. Angelena Form.  Dr. Angelena Form would like to continue the Plavix 1 year post TAVR.  Pt will see Dr.  Tamala Julian the end of Feb.  He will call if any changes in condition.       Chart reviewed as part of pre-operative protocol  coverage. Given past medical history and time since last visit, based on ACC/AHA guidelines, Kashon D Studnicka would be at acceptable risk for the planned procedure without further cardiovascular testing.   2. CAD with stable cath 12/2016 and no chest pain or SOB.  3.  Chronic diastolic HF with some fluid in Rt leg.    4.  HTN mildly elevated today but has pain in knee.  5.  RBBB chronic.      Current medicines are reviewed with the patient today.  The patient Has no concerns regarding medicines.  The following changes have been made:  See above Labs/ tests ordered today include:see above  Disposition:   FU:  see above  Signed, Cecilie Kicks, NP  09/29/2017 3:59 PM    [facility], [address] [place] [place], [place] Phone: [phone]; Fax: [phone]

## 2017-09-29 NOTE — Patient Instructions (Signed)
Medication Instructions:  Your physician recommends that you continue on your current medications as directed. Please refer to the Current Medication list given to you today. Take Amoxicillin 2000 mg as directed prior to dental procedure  Labwork: None Ordered   Testing/Procedures: None Ordered   Follow-Up: Keep your follow-up appointment with Dr. Tamala Julian on 2/21   If you need a refill on your cardiac medications before your next appointment, please call your pharmacy.   Thank you for choosing [facility]! Christen Bame, RN [phone]

## 2017-10-03 ENCOUNTER — Telehealth: Payer: Self-pay | Admitting: Endocrinology

## 2017-10-03 NOTE — Telephone Encounter (Signed)
Needs to get it from the cardiologist

## 2017-10-03 NOTE — Telephone Encounter (Signed)
This is not prescribed you you please advise

## 2017-10-03 NOTE — Telephone Encounter (Signed)
Pt needs new rx for plavix to [facility]

## 2017-10-05 ENCOUNTER — Other Ambulatory Visit: Payer: Self-pay | Admitting: Interventional Cardiology

## 2017-10-05 MED ORDER — CLOPIDOGREL BISULFATE 75 MG PO TABS: 75.0000 mg | ORAL_TABLET | Freq: Every day | ORAL | 3 refills | Status: DC

## 2017-10-05 NOTE — Telephone Encounter (Signed)
Left vm with message below.

## 2017-10-05 NOTE — Telephone Encounter (Signed)
Pt's medication was sent to pt's pharmacy as requested. Confirmation received.  °

## 2017-10-21 NOTE — Telephone Encounter (Signed)
   Primary Cardiologist: Sinclair Grooms, MD  Chart reviewed as part of pre-operative protocol coverage and pt had office visit as well.   Per note from Cecilie Kicks, NP:  For dental procedure ok to stop Plavix for 5 days. + dental prophylaxis with TAVR. For total Rt knee pt is 6.6% risk but his CAD has been stable.  His ASA and plavix may be stopped for 7 days for procedure but resume soon afterwards.    I discussed ASA and plavix with both Dr. Tamala Julian and Dr. Angelena Form.  Dr. Angelena Form would like to continue the Plavix 1 year post TAVR.  Pt will see Dr. Tamala Julian the end of Feb.  He will call if any changes in condition.     Chart reviewed as part of pre-operative protocol coverage. Given past medical history and time since last visit, based on ACC/AHA guidelines, Todd Romero would be at acceptable risk for the planned procedure without further cardiovascular testing.   I will route this recommendation to the requesting party via Epic fax function and remove from pre-op pool.  Please call with questions.  Rosaria Ferries, PA-C 10/21/2017, 12:37 PM

## 2017-10-24 ENCOUNTER — Other Ambulatory Visit: Payer: Self-pay | Admitting: Orthopaedic Surgery

## 2017-10-27 ENCOUNTER — Ambulatory Visit (INDEPENDENT_AMBULATORY_CARE_PROVIDER_SITE_OTHER): Payer: Medicare Other | Admitting: Interventional Cardiology

## 2017-10-27 ENCOUNTER — Encounter: Payer: Self-pay | Admitting: Interventional Cardiology

## 2017-10-27 VITALS — BP 174/80 | HR 69 | Ht 68.0 in | Wt 239.4 lb

## 2017-10-27 DIAGNOSIS — I5032 Chronic diastolic (congestive) heart failure: Secondary | ICD-10-CM | POA: Diagnosis not present

## 2017-10-27 DIAGNOSIS — Z952 Presence of prosthetic heart valve: Secondary | ICD-10-CM | POA: Diagnosis not present

## 2017-10-27 DIAGNOSIS — G4733 Obstructive sleep apnea (adult) (pediatric): Secondary | ICD-10-CM | POA: Diagnosis not present

## 2017-10-27 DIAGNOSIS — I10 Essential (primary) hypertension: Secondary | ICD-10-CM | POA: Diagnosis not present

## 2017-10-27 DIAGNOSIS — I4510 Unspecified right bundle-branch block: Secondary | ICD-10-CM | POA: Diagnosis not present

## 2017-10-27 DIAGNOSIS — I25119 Atherosclerotic heart disease of native coronary artery with unspecified angina pectoris: Secondary | ICD-10-CM | POA: Diagnosis not present

## 2017-10-27 NOTE — Patient Instructions (Signed)

## 2017-10-27 NOTE — Progress Notes (Signed)
Cardiology Office Note    Date:  10/27/2017   ID:  Todd Romero, DOB [birth year], MRN [id number]  PCP:  [name], MD  Cardiologist: Sinclair Grooms, MD   Chief Complaint  Patient presents with  . Coronary Artery Disease  . Medication Problem    History of Present Illness:  Todd Romero is a 84 y.o. male with a history of aortic stenosis now status post successful TAVR 10/4233, chronic diastolic heart failure, CAD with CTO of D1 and 90% LCX (cath 2013), OSA on CPAP, HTN, type 2 diabetes, and hyperlipidemia.  He is doing well.  Experiencing no angina.  Sleeps on 2 relatively shallow pillows.  There is no peripheral edema.  He denies palpitations, syncope, and transient neurological complaints.   Past Medical History:  Diagnosis Date  . Anemia   . Arthritis   . CAD (coronary artery disease)    a. cardiac cath 12/2016 showing moderate AS, elevated LVEDP, heavy 3V coronary calcification, 100% mD2, 30-50% LAD, 50-90% stenosis of Cx proximal to origin of L-PDA, RCA not engaged due to poor catheter control (difficult procedure) - coronary status was essentially unchanged from prior.  . Chronic diastolic CHF (congestive heart failure) ([place])   . Degenerative arthritis   . Diabetes ([place])   . Dyspnea   . Essential hypertension   . Hyperlipidemia   . Obesity (BMI 30-39.9) 06/18/2015  . OSA (obstructive sleep apnea)    Severe with AHI 27/hr now on CPAP  no cpap  . Pure hypercholesterolemia   . RBBB   . [facility] spotted fever   . 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 Severe aortic stenosis    a. s/p TAVR 03/2017.  . Type II or unspecified type diabetes mellitus without mention of complication, not stated as uncontrolled     Past Surgical History:  Procedure Laterality Date  . APPENDECTOMY    . CARPAL TUNNEL RELEASE    . KNEE  CARTILAGE SURGERY    . PARTIAL COLECTOMY    . RIGHT/LEFT HEART CATH AND CORONARY ANGIOGRAPHY N/A 12/28/2016   Procedure: Right/Left Heart Cath and Coronary Angiography;  Surgeon: Belva Crome, MD;  Location: [place] [facility];  Service: Cardiovascular;  Laterality: N/A;  . TEE WITHOUT CARDIOVERSION N/A 03/15/2017   Procedure: TRANSESOPHAGEAL ECHOCARDIOGRAM (TEE);  Surgeon: Burnell Blanks, MD;  Location: [place];  Service: Open Heart Surgery;  Laterality: N/A;  . TRANSCATHETER AORTIC VALVE REPLACEMENT, TRANSFEMORAL N/A 03/15/2017   Procedure: TRANSCATHETER AORTIC VALVE REPLACEMENT, TRANSFEMORAL;  Surgeon: Burnell Blanks, MD;  Location: [place];  Service: Open Heart Surgery;  Laterality: N/A;  . VEIN SURGERY      Current Medications: Outpatient Medications Prior to Visit  Medication Sig Dispense Refill  . ACCU-CHEK AVIVA PLUS test strip USE AS INSTRUCTED TO CHECK BLOOD SUGAR TWICE A DAY 200 each 2  . acetaminophen (TYLENOL) 500 MG tablet Take 500 mg by mouth every 4 (four) hours as needed for moderate pain or headache.    [name] aspirin EC 81 MG tablet Take 81 mg by mouth daily.    . B-D INS SYR ULTRAFINE 1CC/31G 31G X 5/16" 1 ML MISC USE TO INJECT INSULIN DAILY 100 each 3  . clopidogrel (PLAVIX) 75 MG tablet Take 1 tablet (75 mg total) by mouth daily with breakfast. 90 tablet 3  . ferrous sulfate 325 (65  FE) MG tablet Take 325 mg by mouth daily with breakfast.    . fish oil-omega-3 fatty acids 1000 MG capsule Take 1 g by mouth daily at 12 noon.     [name] glimepiride (AMARYL) 2 MG tablet Take 0.5 tablets (1 mg total) by mouth daily. 45 tablet 3  . insulin lispro (HUMALOG) 100 UNIT/ML injection Inject 4 to 10 units into the skin three (3) times daily with meals as directed.    . insulin NPH Human (HUMULIN N,NOVOLIN N) 100 UNIT/ML injection Inject 0.26 mLs (26 Units total) into the skin at bedtime. Uses Vial 20 mL 3  . Insulin Pen Needle (B-D UF III MINI PEN NEEDLES) 31G X 5 MM MISC USE 2 PEN  NEEDLE PER DAY WITH HUMALOG AND VICTOZA 200 each 3  . liraglutide 18 MG/3ML SOPN Inject 0.3 mLs (1.8 mg total) into the skin at bedtime. 15 pen 2  . metFORMIN (GLUMETZA) 500 MG (MOD) 24 hr tablet Take 1 tablet (500 mg total) by mouth 2 (two) times daily with a meal. (morning & noon) 180 tablet 4  . Multiple Vitamin (MULTIVITAMIN WITH MINERALS) TABS tablet Take 1 tablet by mouth daily.    . Multiple Vitamins-Minerals (PRESERVISION AREDS 2 PO) Take 1 capsule by mouth 2 (two) times daily.    [name] olmesartan (BENICAR) 40 MG tablet Take 1 tablet (40 mg total) by mouth daily. 90 tablet 3  . pravastatin (PRAVACHOL) 80 MG tablet Take 1 tablet (80 mg total) by mouth every evening. 90 tablet 1  . tamsulosin (FLOMAX) 0.4 MG CAPS capsule Take 1 capsule daily 90 capsule 1  . vitamin C (ASCORBIC ACID) 500 MG tablet Take 500 mg by mouth daily.    . furosemide (LASIX) 20 MG tablet Take 1 tablet (20 mg total) by mouth daily. 90 tablet 3  . insulin lispro (HUMALOG KWIKPEN) 100 UNIT/ML KiwkPen 4-8 units before the main meals of the day (Patient not taking: Reported on 10/27/2017) 15 mL 1   No facility-administered medications prior to visit.      Allergies:   Morphine and related   Social History   Socioeconomic History  . Marital status: Married    Spouse name: None  . Number of children: 4  . Years of education: None  . Highest education level: None  Social Needs  . Financial resource strain: None  . Food insecurity - worry: None  . Food insecurity - inability: None  . Transportation needs - medical: None  . Transportation needs - non-medical: None  Occupational History  . Occupation: Construction-Retired  Tobacco Use  . Smoking status: Never Smoker  . Smokeless tobacco: Never Used  Substance and Sexual Activity  . Alcohol use: No  . Drug use: No  . Sexual activity: None  Other Topics Concern  . None  Social History Narrative  . None     Family History:  The patient's family history includes  Cancer in his sister; Emphysema in his father; Healthy in his sister; Heart failure in his father; Hypertension in his mother.   ROS:   Please see the history of present illness.    Difficulty with ambulation due to right knee arthritis.  Planning to have partial right knee replacement by Dr Shireen Quan.  States that despite having the transaortic valve replacement he feels no different than he did prior to the procedure.  He does notice however that he does not require furosemide at the same intensity as before and that his breathing is better  than before.  He cannot determine if his endurance has improved because he is limited by his knee. All other systems reviewed and are negative.   PHYSICAL EXAM:   VS:  BP (!) 174/80   Pulse 69   Ht 5\' 8"  (1.727 m)   Wt 239 lb 6.4 oz (108.6 kg)   BMI 36.40 kg/m    GEN: Well nourished, well developed, in no acute distress  HEENT: normal  Neck: no JVD, carotid bruits, or masses Cardiac: RRR; no murmurs, rubs, or gallops,no edema  Respiratory:  clear to auscultation bilaterally, normal work of breathing GI: soft, nontender, nondistended, + BS MS: no deformity or atrophy  Skin: warm and dry, no rash Neuro:  Alert and Oriented x 3, Strength and sensation are intact Psych: euthymic mood, full affect  Wt Readings from Last 3 Encounters:  10/27/17 239 lb 6.4 oz (108.6 kg)  09/29/17 241 lb 1.9 oz (109.4 kg)  07/05/17 239 lb 9.6 oz (108.7 kg)      Studies/Labs Reviewed:   EKG:  EKG not repeated.  Tracing on 09/29/2017 demonstrated first-degree AV block, left anterior hemiblock, right bundle branch block.  And no significant change compared to prior.  Recent Labs: 03/30/2017: Magnesium 2.3 05/02/2017: ALT 13 07/01/2017: BUN 27; Creatinine, Ser 1.00; Hemoglobin 11.9; Platelets 153.0; Potassium 4.3; Sodium 136   Lipid Panel    Component Value Date/Time   CHOL 175 05/02/2017 0855   TRIG 184.0 (H) 05/02/2017 0855   HDL 46.60 05/02/2017 0855   CHOLHDL  4 05/02/2017 0855   VLDL 36.8 05/02/2017 0855   LDLCALC 91 05/02/2017 0855   LDLDIRECT 101.0 04/26/2016 0915    Additional studies/ records that were reviewed today include:  none    ASSESSMENT:    1. S/P TAVR (transcatheter aortic valve replacement)   2. Coronary artery disease involving native coronary artery of native heart with angina pectoris ([place])   3. Chronic diastolic heart failure ([place])   4. Essential hypertension, benign   5. RBBB   6. Obstructive sleep apnea      PLAN:  In order of problems listed above:  1. Normal Taber valve function based on clinical appraisal. 2. Asymptomatic with reference to angina.  The upcoming partial knee replacement, he will hold Plavix 5 days prior.  3.  No obvious evidence of volume overload on clinical exam but symptomatically, may still be slightly volume overloaded.  He prefers low-dose diuretic therapy because of urinary frequency. 4. May need intensification of therapy for the time being, no significant changes being made.  My recommendation is further improvement is required would be to add low-dose Aldactone, perhaps 12.5 mg/day and follow kidney function and potassium closely. 5. No change  He is cleared for the upcoming partial knee replacement.  He will be low to moderate risk for surgical/anesthesia related cardiac complications.  He is currently compensated and no changes are recommended.  We discussed holding Plavix for 5-6 days prior to the procedure.  Medication Adjustments/Labs and Tests Ordered: Current medicines are reviewed at length with the patient today.  Concerns regarding medicines are outlined above.  Medication changes, Labs and Tests ordered today are listed in the Patient Instructions below. There are no Patient Instructions on file for this visit.   Signed, Sinclair Grooms, MD  10/27/2017 3:25 PM    [facility] [address], [place], [place]  [address] Phone: [phone]; Fax:  [phone]

## 2017-10-31 ENCOUNTER — Other Ambulatory Visit (INDEPENDENT_AMBULATORY_CARE_PROVIDER_SITE_OTHER): Payer: Medicare Other

## 2017-10-31 DIAGNOSIS — E1165 Type 2 diabetes mellitus with hyperglycemia: Secondary | ICD-10-CM

## 2017-10-31 DIAGNOSIS — Z794 Long term (current) use of insulin: Secondary | ICD-10-CM

## 2017-10-31 DIAGNOSIS — D508 Other iron deficiency anemias: Secondary | ICD-10-CM | POA: Diagnosis not present

## 2017-10-31 LAB — COMPREHENSIVE METABOLIC PANEL
ALBUMIN: 4 g/dL (ref 3.5–5.2)
ALK PHOS: 53 U/L (ref 39–117)
ALT: 13 U/L (ref 0–53)
AST: 19 U/L (ref 0–37)
BILIRUBIN TOTAL: 0.5 mg/dL (ref 0.2–1.2)
BUN: 21 mg/dL (ref 6–23)
CO2: 24 mEq/L (ref 19–32)
CREATININE: 1.06 mg/dL (ref 0.40–1.50)
Calcium: 9.9 mg/dL (ref 8.4–10.5)
Chloride: 104 mEq/L (ref 96–112)
GFR: 71.16 mL/min (ref >60.00)
GLUCOSE: 108 mg/dL — AB (ref 70–99)
POTASSIUM: 4.3 meq/L (ref 3.5–5.1)
SODIUM: 137 meq/L (ref 135–145)
TOTAL PROTEIN: 6.8 g/dL (ref 6.0–8.3)

## 2017-10-31 LAB — CBC
HEMATOCRIT: 37.1 % — AB (ref 39.0–52.0)
Hemoglobin: 12.4 g/dL — ABNORMAL LOW (ref 13.0–17.0)
MCHC: 33.3 g/dL (ref 30.0–36.0)
MCV: 91.9 fl (ref 78.0–100.0)
Platelets: 151 10*3/uL (ref 150.0–400.0)
RBC: 4.04 Mil/uL — AB (ref 4.22–5.81)
RDW: 14.7 % (ref 11.5–15.5)
WBC: 6.9 10*3/uL (ref 4.0–10.5)

## 2017-10-31 LAB — HEMOGLOBIN A1C: Hgb A1c MFr Bld: 7.5 % — ABNORMAL HIGH (ref 4.6–6.5)

## 2017-10-31 LAB — MICROALBUMIN / CREATININE URINE RATIO
Creatinine,U: 41.7 mg/dL
MICROALB UR: 22.8 mg/dL — AB (ref 0.0–1.9)
MICROALB/CREAT RATIO: 54.7 mg/g — AB (ref 0.0–30.0)

## 2017-11-01 ENCOUNTER — Encounter (HOSPITAL_COMMUNITY)
Admission: RE | Admit: 2017-11-01 | Discharge: 2017-11-01 | Disposition: A | Discharge status: Home / Self Care | Payer: Medicare Other | Source: Ambulatory Visit | Attending: Orthopaedic Surgery | Admitting: Orthopaedic Surgery

## 2017-11-01 ENCOUNTER — Ambulatory Visit (HOSPITAL_COMMUNITY)
Admission: RE | Admit: 2017-11-01 | Discharge: 2017-11-01 | Disposition: A | Discharge status: Home / Self Care | Payer: Medicare Other | Source: Ambulatory Visit | Attending: Orthopaedic Surgery | Admitting: Orthopaedic Surgery

## 2017-11-01 ENCOUNTER — Encounter (HOSPITAL_COMMUNITY): Payer: Self-pay

## 2017-11-01 ENCOUNTER — Other Ambulatory Visit: Payer: Self-pay

## 2017-11-01 DIAGNOSIS — Z952 Presence of prosthetic heart valve: Secondary | ICD-10-CM | POA: Diagnosis not present

## 2017-11-01 DIAGNOSIS — I700 Atherosclerosis of aorta: Secondary | ICD-10-CM | POA: Diagnosis not present

## 2017-11-01 DIAGNOSIS — E119 Type 2 diabetes mellitus without complications: Secondary | ICD-10-CM | POA: Diagnosis not present

## 2017-11-01 DIAGNOSIS — Z01818 Encounter for other preprocedural examination: Secondary | ICD-10-CM

## 2017-11-01 DIAGNOSIS — I517 Cardiomegaly: Secondary | ICD-10-CM | POA: Diagnosis not present

## 2017-11-01 HISTORY — DX: Pneumonia, unspecified organism: J18.9

## 2017-11-01 HISTORY — DX: Peritonitis, unspecified: K65.9

## 2017-11-01 LAB — URINALYSIS, ROUTINE W REFLEX MICROSCOPIC
Bacteria, UA: NONE SEEN
Bilirubin Urine: NEGATIVE
Glucose, UA: NEGATIVE mg/dL
Hgb urine dipstick: NEGATIVE
Ketones, ur: NEGATIVE mg/dL
Leukocytes, UA: NEGATIVE
Nitrite: NEGATIVE
Protein, ur: 30 mg/dL — AB
Specific Gravity, Urine: 1.012 (ref 1.005–1.030)
Squamous Epithelial / HPF: NONE SEEN
pH: 6 (ref 5.0–8.0)

## 2017-11-01 LAB — SURGICAL PCR SCREEN
MRSA, PCR: NEGATIVE
Staphylococcus aureus: NEGATIVE

## 2017-11-01 LAB — CBC WITH DIFFERENTIAL/PLATELET
Basophils Absolute: 0 K/uL (ref 0.0–0.1)
Basophils Relative: 0 %
Eosinophils Absolute: 0.2 K/uL (ref 0.0–0.7)
Eosinophils Relative: 2 %
HCT: 38.1 % — ABNORMAL LOW (ref 39.0–52.0)
Hemoglobin: 12.5 g/dL — ABNORMAL LOW (ref 13.0–17.0)
Lymphocytes Relative: 32 %
Lymphs Abs: 2.5 K/uL (ref 0.7–4.0)
MCH: 30.1 pg (ref 26.0–34.0)
MCHC: 32.8 g/dL (ref 30.0–36.0)
MCV: 91.8 fL (ref 78.0–100.0)
Monocytes Absolute: 0.6 K/uL (ref 0.1–1.0)
Monocytes Relative: 7 %
Neutro Abs: 4.6 K/uL (ref 1.7–7.7)
Neutrophils Relative %: 59 %
Platelets: 174 K/uL (ref 150–400)
RBC: 4.15 MIL/uL — ABNORMAL LOW (ref 4.22–5.81)
RDW: 14.5 % (ref 11.5–15.5)
WBC: 7.9 K/uL (ref 4.0–10.5)

## 2017-11-01 LAB — TYPE AND SCREEN
ABO/RH(D): A POS
Antibody Screen: NEGATIVE

## 2017-11-01 LAB — GLUCOSE, CAPILLARY: Glucose-Capillary: 159 mg/dL — ABNORMAL HIGH (ref 65–99)

## 2017-11-01 LAB — PROTIME-INR
INR: 1.03
Prothrombin Time: 13.4 s (ref 11.4–15.2)

## 2017-11-01 LAB — APTT: aPTT: 29 seconds (ref 24–36)

## 2017-11-01 NOTE — Progress Notes (Signed)
PCP - Dr. Dwyane Dee Cardiologist - [name]  Chest x-ray - n/a EKG - 09/29/2017 Stress Test - 02/07/2017 ECHO - 04/20/2017 Cardiac Cath - 12/28/2016 TAVR - 03/15/2017  Sleep Study - 2016 CPAP - no, patient states he does not tolerate CPAP  Fasting Blood Sugar - 90-110 Checks Blood Sugar 2 times a day  Blood Thinner Instructions:patient instructed by Dr. Tamala Julian to stop Plavix 5 days prior to surgery Aspirin Instructions: patient did not get any instructions from Dr. Tamala Julian regarding ASA, instructed patient to ask PCP when he sees him on Thursday 11/03/2017  Anesthesia review: yes, cardiac hx  Patient denies shortness of breath, fever, cough and chest pain at PAT appointment   Patient verbalized understanding of instructions that were given to them at the PAT appointment. Patient was also instructed that they will need to review over the PAT instructions again at home before surgery.

## 2017-11-01 NOTE — Pre-Procedure Instructions (Addendum)
Todd Romero  11/01/2017      [facility] - [address], [place] [address] Phone: [phone] Fax: [phone]  [id number] - [place], [place]  [address] Phone: [phone] Fax: [phone]  [facility] [place], [place] [address] [place] [address] Phone: [phone] Fax: [phone]    Your procedure is scheduled on 11/08/2017.  Report to [facility] Admitting at [phone] A.M.  Call this number if you have problems the morning of surgery:  [phone]   Remember:  Do not eat food or drink liquids after midnight.   Continue all medications as directed by your physician except follow these medication instructions before surgery below   Take these medicines the morning of surgery with A SIP OF WATER: Acetaminophen (Tylenol) - if needed for pain Amlodipine (Norvasc)   7 days prior to surgery STOP taking any Aspirin(unless otherwise instructed by your surgeon), Aleve, Naproxen, Ibuprofen, Motrin, Advil, Goody's, BC's, all herbal medications, fish oil, and all vitamins  Follow your doctors instructions regarding your Aspirin and Plavix.  If no instructions were given by your doctor, then you will need to call the prescribing office office to get instructions.    WHAT DO I DO ABOUT MY DIABETES MEDICATION?  [name] Do not take oral diabetes medicines (pills) the morning of surgery. - DO NOT take your glimepiride (Amaryl) or Metformin (Glumetza) the morning of surgery.  . THE NIGHT BEFORE SURGERY, take 13 units of NPH Human (Humulin N, Novolin N) insulin.      . The NIGHT BEFORE SURGERY, take your Victoza (liraglutide), as usual.  . The morning of surgery, if your CBG is greater than 220 mg/dL, you may take  of your sliding scale (correction) dose of insulin.    How  to Manage Your Diabetes Before and After Surgery  Why is it important to control my blood sugar before and after surgery? . Improving blood sugar levels before and after surgery helps healing and can limit problems. . A way of improving blood sugar control is eating a healthy diet by: o  Eating less sugar and carbohydrates o  Increasing activity/exercise o  Talking with your doctor about reaching your blood sugar goals . High blood sugars (greater than 180 mg/dL) can raise your risk of infections and slow your recovery, so you will need to focus on controlling your diabetes during the weeks before surgery. . Make sure that the doctor who takes care of your diabetes knows about your planned surgery including the date and location.  How do I manage my blood sugar before surgery? . Check your blood sugar at least 4 times a day, starting 2 days before surgery, to make sure that the level is not too high or low. o Check your blood sugar the morning of your surgery when you wake up and every 2 hours until you get to the Short Stay unit. . If your blood sugar is less than 70 mg/dL, you will need to treat for low blood sugar: o Do not take insulin. o Treat a low blood sugar (less than 70 mg/dL) with  cup of clear juice (cranberry or apple), 4 glucose tablets, OR glucose gel. Recheck blood sugar in 15 minutes after treatment (to make sure it is greater  than 70 mg/dL). If your blood sugar is not greater than 70 mg/dL on recheck, call [phone] o  for further instructions. . Report your blood sugar to the short stay nurse when you get to Short Stay.  . If you are admitted to the hospital after surgery: o Your blood sugar will be checked by the staff and you will probably be given insulin after surgery (instead of oral diabetes medicines) to make sure you have good blood sugar levels. o The goal for blood sugar control after surgery is 80-180 mg/dL.     Do not wear jewelry  Do not wear lotions,  powders, or colognes, or deodorant.  Men may shave face and neck.  Do not bring valuables to the hospital.  [facility] is not responsible for any belongings or valuables.  Hearing aids, eyeglasses, contacts, dentures, partials or bridgework may not be worn into surgery.  Leave your suitcase in the car.  After surgery it may be brought to your room.  For patients admitted to the hospital, discharge time will be determined by your treatment team.  Patients discharged the day of surgery will not be allowed to drive home.   Name and phone number of your driver:    Special instructions:   [place]- Preparing For Surgery  Before surgery, you can play an important role. Because skin is not sterile, your skin needs to be as free of germs as possible. You can reduce the number of germs on your skin by washing with CHG (chlorahexidine gluconate) Soap before surgery.  CHG is an antiseptic cleaner which kills germs and bonds with the skin to continue killing germs even after washing.  Please do not use if you have an allergy to CHG or antibacterial soaps. If your skin becomes reddened/irritated stop using the CHG.  Do not shave (including legs and underarms) for at least 48 hours prior to first CHG shower. It is OK to shave your face.  Please follow these instructions carefully.   1. Shower the NIGHT BEFORE SURGERY and the MORNING OF SURGERY with CHG.   2. If you chose to wash your hair, wash your hair first as usual with your normal shampoo.  3. After you shampoo, rinse your hair and body thoroughly to remove the shampoo.  4. Use CHG as you would any other liquid soap. You can apply CHG directly to the skin and wash gently with a scrungie or a clean washcloth.   5. Apply the CHG Soap to your body ONLY FROM THE NECK DOWN.  Do not use on open wounds or open sores. Avoid contact with your eyes, ears, mouth and genitals (private parts). Wash Face and genitals (private parts)  with your normal  soap.  6. Wash thoroughly, paying special attention to the area where your surgery will be performed.  7. Thoroughly rinse your body with warm water from the neck down.  8. DO NOT shower/wash with your normal soap after using and rinsing off the CHG Soap.  9. Pat yourself dry with a CLEAN TOWEL.  10. Wear CLEAN PAJAMAS to bed the night before surgery, wear comfortable clothes the morning of surgery  11. Place CLEAN SHEETS on your bed the night of your first shower and DO NOT SLEEP WITH PETS.    Day of Surgery: Shower as stated above. Do not apply any deodorants/lotions.  Please wear clean clothes to the hospital/surgery center.      Please read over the following fact sheets that you  were given.

## 2017-11-02 NOTE — Progress Notes (Signed)
Anesthesia Chart Review:  Pt is an 84 year old male scheduled for R total knee arthroplasty on 11/08/2017 with Melrose Nakayama, MD  - PCP is [name], MD - Cardiologist is Daneen Schick, MD who is aware of upcoming surgery and gave ok to hold plavix for 5-6 days. Last office visit 10/27/17  PMH includes:  CAD (CTO D1, 50-90% stenosis proximal to origin L-PDA), aortic stenosis (s/p TAVR 03/15/17), chronic diastolic HF, RBBB, HTN, DM, hyperlipidemia, OSA, anemia.  Never smoker.  BMI 36.  Medications include: Amlodipine, ASA 81 mg, Plavix, iron, Lasix, glimepiride, Humalog, Humulin N, liraglutide, metformin, olmesartan, pravastatin. Pt to hold plavix 5-6 days before surgery  BP (!) 138/59   Pulse 65   Temp 37 C   Resp 20   Ht 5\' 8"  (1.727 m)   Wt 238 lb 4.8 oz (108.1 kg)   SpO2 98%   BMI 36.23 kg/m   Preoperative labs reviewed.   - Glucose 159. HbA1c 7.5 on 10/31/17  CXR 11/01/17:  - No acute cardiopulmonary disease. - Cardiomegaly without congestive failure. - Aortic Atherosclerosis  EKG 09/29/17: Sinus bradycardia (57 bpm) with first-degree AV block.  LAD.  RBBB.  Septal infarct, age undetermined  Echo 04/20/17:  - Left ventricle: Wall thickness was increased in a pattern of mild LVH. Systolic function was normal. The estimated ejection fraction was in the range of 55% to 60%. Wall motion was normal; there were no regional wall motion abnormalities. Features are consistent with a pseudonormal left ventricular filling pattern, with concomitant abnormal relaxation and increased filling pressure (grade 2 diastolic dysfunction). - Aortic valve: A bioprosthesis was present. Valve area (VTI): 1.9cm^2. Valve area (Vmax): 1.86 cm^2. Valve area (Vmean): 1.71cm^2. - Mitral valve: Mildly calcified annulus. - Left atrium: The atrium was moderately dilated. - Right atrium: The atrium was mildly dilated.  Cardiac cath 12/28/16:   Difficult procedure due to severe angulation in the right  innominate/ascending aortic region that produced catheter resistance to torque and advancement.  Moderate aortic stenosis with a transvalvular gradient of 29 mmHg (mean) and 33-38 mmHg (peak to peak). Calculated aortic valve area 1.35 cm.  Elevated left ventricular end-diastolic pressure and pulmonary capillary wedge pressure. Known LV systolic function with EF greater than 50% by recent echo suggesting chronic diastolic left heart failure.  Heavy three-vessel coronary calcification.  100% stenosis in the mid body of the second diagonal. There is diffuse 30-50% narrowing in the LAD.  The circumflex coronary is dominant. Moderate to heavy calcification is noted throughout the distal vessel and in the proximal segment. There is eccentric 50-90% stenosis proximal to the origin of the L-PDA.  Nondominant right coronary. Not selectively engaged due to poor catheter control. The vessel is patent and supplies only the right ventricle. RECOMMENDATIONS:  Coronary status is essentially unchanged compared to 5 years ago.  Moderate aortic stenosis based upon hemodynamics obtained from this case. This matches the recent echo.  Explanation for exertional fatigue is uncertain. Consider referral to the valve clinic for a second opinion concerning management of aortic stenosis. May need dobutamine echo as he is potentially a low gradient severe aortic stenosis substrate.  Case is posted for spinal anesthesia but plavix will only be held 5-6 days before surgery, and the window to hold plavix for 7 days has already closed.   If no changes, I anticipate pt can proceed with surgery as scheduled.   Willeen Cass, FNP-BC [facility] Short Stay Surgical Center/Anesthesiology Phone: [phone] 11/02/2017 2:30 PM

## 2017-11-02 NOTE — H&P (Signed)
TOTAL KNEE ADMISSION H&P  Patient is being admitted for right total knee arthroplasty.  Subjective:  Chief Complaint:right knee pain.  HPI: Todd Romero, 84 y.o. male, has a history of pain and functional disability in the right knee due to arthritis and has failed non-surgical conservative treatments for greater than 12 weeks to includeNSAID's and/or analgesics, corticosteriod injections, viscosupplementation injections, flexibility and strengthening excercises, use of assistive devices, weight reduction as appropriate and activity modification.  Onset of symptoms was gradual, starting 5 years ago with gradually worsening course since that time. The patient noted no past surgery on the right knee(s).  Patient currently rates pain in the right knee(s) at 10 out of 10 with activity. Patient has night pain, worsening of pain with activity and weight bearing, pain that interferes with activities of daily living, crepitus and joint swelling.  Patient has evidence of subchondral cysts, subchondral sclerosis, periarticular osteophytes and joint space narrowing by imaging studies. There is no active infection.  Patient Active Problem List   Diagnosis Date Noted  . 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 Chronic diastolic heart failure ([place]) 12/18/2015  . Aortic stenosis 12/18/2015  . Obesity (BMI 30-39.9) 06/18/2015  . Obstructive sleep apnea 02/11/2015  . Dyspnea on exertion 02/11/2015  . Primary osteoarthritis of right knee 03/18/2014  . Essential hypertension, benign 06/21/2013    Class: Chronic  . RBBB   . CAD (coronary artery disease)     Class: Chronic  . Hyperlipidemia   . Type II or unspecified type diabetes mellitus without mention of complication, not stated as uncontrolled 05/02/2013   Past Medical History:  Diagnosis Date  . Anemia   . Arthritis   . CAD (coronary artery disease)    a. cardiac cath 12/2016 showing moderate AS, elevated LVEDP, heavy 3V  coronary calcification, 100% mD2, 30-50% LAD, 50-90% stenosis of Cx proximal to origin of L-PDA, RCA not engaged due to poor catheter control (difficult procedure) - coronary status was essentially unchanged from prior.  . Chronic diastolic CHF (congestive heart failure) ([place])   . Degenerative arthritis   . Diabetes ([place])   . Dyspnea   . Essential hypertension   . Hyperlipidemia   . Obesity (BMI 30-39.9) 06/18/2015  . OSA (obstructive sleep apnea)    Severe with AHI 27/hr now on CPAP  no cpap  . Peritonitis ([place]) 1985?  [name] Pneumonia    "6 months - 84 years old"  . Pure hypercholesterolemia   . RBBB   . [facility] spotted fever   . 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 Severe aortic stenosis    a. s/p TAVR 03/2017.  . Type II or unspecified type diabetes mellitus without mention of complication, not stated as uncontrolled     Past Surgical History:  Procedure Laterality Date  . APPENDECTOMY    . CARPAL TUNNEL RELEASE    . EYE SURGERY Bilateral    cataracts removed, lens placed  . KNEE CARTILAGE SURGERY    . PARTIAL COLECTOMY    . RIGHT/LEFT HEART CATH AND CORONARY ANGIOGRAPHY N/A 12/28/2016   Procedure: Right/Left Heart Cath and Coronary Angiography;  Surgeon: Belva Crome, MD;  Location: [facility];  Service: Cardiovascular;  Laterality: N/A;  . TEE WITHOUT CARDIOVERSION N/A 03/15/2017   Procedure: TRANSESOPHAGEAL ECHOCARDIOGRAM (TEE);  Surgeon: Burnell Blanks, MD;  Location: [facility];  Service: Open Heart Surgery;  Laterality: N/A;  . TRANSCATHETER AORTIC VALVE REPLACEMENT, TRANSFEMORAL N/A 03/15/2017   Procedure: TRANSCATHETER AORTIC VALVE REPLACEMENT, TRANSFEMORAL;  Surgeon: Burnell Blanks, MD;  Location: [facility];  Service: Open Heart Surgery;  Laterality: N/A;  . VEIN SURGERY      No current facility-administered medications for  this encounter.    Current Outpatient Medications  Medication Sig Dispense Refill Last Dose  . acetaminophen (TYLENOL) 500 MG tablet Take 500 mg by mouth every 4 (four) hours as needed for moderate pain or headache.   Taking  . aspirin EC 81 MG tablet Take 81 mg by mouth daily.   Taking  . clopidogrel (PLAVIX) 75 MG tablet Take 1 tablet (75 mg total) by mouth daily with breakfast. 90 tablet 3 Taking  . ferrous sulfate 325 (65 FE) MG tablet Take 325 mg by mouth daily with breakfast.   Taking  . fish oil-omega-3 fatty acids 1000 MG capsule Take 1 g by mouth daily at 12 noon.    Taking  . furosemide (LASIX) 20 MG tablet Take 1 tablet (20 mg total) by mouth daily. 90 tablet 3 Taking  . glimepiride (AMARYL) 2 MG tablet Take 0.5 tablets (1 mg total) by mouth daily. 45 tablet 3 Taking  . insulin lispro (HUMALOG) 100 UNIT/ML injection Inject 4 to 10 units into the skin three (3) times daily with meals as directed.   Taking  . insulin NPH Human (HUMULIN N,NOVOLIN N) 100 UNIT/ML injection Inject 0.26 mLs (26 Units total) into the skin at bedtime. Uses Vial 20 mL 3 Taking  . liraglutide 18 MG/3ML SOPN Inject 0.3 mLs (1.8 mg total) into the skin at bedtime. 15 pen 2 Taking  . metFORMIN (GLUMETZA) 500 MG (MOD) 24 hr tablet Take 1 tablet (500 mg total) by mouth 2 (two) times daily with a meal. (morning & noon) 180 tablet 4 Taking  . Multiple Vitamin (MULTIVITAMIN WITH MINERALS) TABS tablet Take 1 tablet by mouth daily.   Taking  . Multiple Vitamins-Minerals (PRESERVISION AREDS 2 PO) Take 1 capsule by mouth 2 (two) times daily.   Taking  . olmesartan (BENICAR) 40 MG tablet Take 1 tablet (40 mg total) by mouth daily. 90 tablet 3 Taking  . pravastatin (PRAVACHOL) 80 MG tablet Take 1 tablet (80 mg total) by mouth every evening. 90 tablet 1 Taking  . tamsulosin (FLOMAX) 0.4 MG CAPS capsule Take 1 capsule daily 90 capsule 1 Taking  . vitamin C (ASCORBIC ACID) 500 MG tablet Take 500 mg by mouth daily.   Taking  .  ACCU-CHEK AVIVA PLUS test strip USE AS INSTRUCTED TO CHECK BLOOD SUGAR TWICE A DAY 200 each 2 Taking  . amLODipine (NORVASC) 2.5 MG tablet Take 2.5 mg by mouth daily.     . B-D INS SYR ULTRAFINE 1CC/31G 31G X 5/16" 1 ML MISC USE TO INJECT INSULIN DAILY 100 each 3 Taking  . Insulin Pen Needle (B-D UF III MINI PEN NEEDLES) 31G X 5 MM MISC USE 2 PEN NEEDLE PER DAY WITH HUMALOG AND VICTOZA 200 each 3 Taking   Allergies  Allergen Reactions  . Morphine And Related Nausea And Vomiting    Social History   Tobacco Use  . Smoking status: Never Smoker  . Smokeless tobacco: Never Used  Substance Use Topics  . Alcohol use: No    Family History  Problem Relation Age of Onset  . Heart failure Father   . Emphysema Father   . Hypertension Mother   .  Cancer Sister   . Healthy Sister      Review of Systems  Musculoskeletal: Positive for joint pain.       Right knee  All other systems reviewed and are negative.   Objective:  Physical Exam  Constitutional: He is oriented to person, place, and time. He appears well-developed and well-nourished.  HENT:  Head: Normocephalic and atraumatic.  Eyes: Pupils are equal, round, and reactive to light.  Neck: Normal range of motion.  Cardiovascular: Normal rate.  Respiratory: Effort normal.  GI: Soft.  Musculoskeletal:  Right knee motion is 0-95.  He has trace effusion.  There is crepitation and medial joint line pain.  Hip motion does not cause any leg pain and straight leg raise is negative today.  Opposite knee moves a bit more.  He has normal unlabored respirations.  He has 1+ pitting edema in both legs.  Neurological: He is alert and oriented to person, place, and time.  Skin: Skin is warm and dry.  Psychiatric: He has a normal mood and affect. His behavior is normal. Judgment and thought content normal.    Vital signs in last 24 hours: Temp:  [98.6 F (37 C)] 98.6 F (37 C) (02/26 1359) Pulse Rate:  [65] 65 (02/26 1359) Resp:  [20] 20  (02/26 1359) BP: (138)/(59) 138/59 (02/26 1359) SpO2:  [98 %] 98 % (02/26 1359) Weight:  [108.1 kg (238 lb 4.8 oz)] 108.1 kg (238 lb 4.8 oz) (02/26 1359)  Labs:   Estimated body mass index is 36.23 kg/m as calculated from the following:   Height as of 11/01/17: 5\' 8"  (1.727 m).   Weight as of 11/01/17: 108.1 kg (238 lb 4.8 oz).   Imaging Review Plain radiographs demonstrate severe degenerative joint disease of the right knee(s). The overall alignment isneutral. The bone quality appears to be good for age and reported activity level.  Assessment/Plan:  End stage primary arthritis, right knee   The patient history, physical examination, clinical judgment of the provider and imaging studies are consistent with end stage degenerative joint disease of the right knee(s) and total knee arthroplasty is deemed medically necessary. The treatment options including medical management, injection therapy arthroscopy and arthroplasty were discussed at length. The risks and benefits of total knee arthroplasty were presented and reviewed. The risks due to aseptic loosening, infection, stiffness, patella tracking problems, thromboembolic complications and other imponderables were discussed. The patient acknowledged the explanation, agreed to proceed with the plan and consent was signed. Patient is being admitted for inpatient treatment for surgery, pain control, PT, OT, prophylactic antibiotics, VTE prophylaxis, progressive ambulation and ADL's and discharge planning. The patient is planning to be discharged home with home health services

## 2017-11-03 ENCOUNTER — Ambulatory Visit (INDEPENDENT_AMBULATORY_CARE_PROVIDER_SITE_OTHER): Payer: Medicare Other | Admitting: Endocrinology

## 2017-11-03 ENCOUNTER — Other Ambulatory Visit: Payer: Self-pay

## 2017-11-03 ENCOUNTER — Encounter: Payer: Self-pay | Admitting: Endocrinology

## 2017-11-03 VITALS — BP 142/68 | HR 65 | Ht 68.0 in | Wt 235.0 lb

## 2017-11-03 DIAGNOSIS — Z794 Long term (current) use of insulin: Secondary | ICD-10-CM | POA: Diagnosis not present

## 2017-11-03 DIAGNOSIS — E1165 Type 2 diabetes mellitus with hyperglycemia: Secondary | ICD-10-CM

## 2017-11-03 NOTE — Patient Instructions (Addendum)
Take 10-12 Units at supper unless eating soup  Take no more than 24 N at [employer]

## 2017-11-03 NOTE — Progress Notes (Signed)
Patient ID: Todd Romero, male   DOB: 1934/04/08, 84 y.o.   MRN: [id number]    Reason for Appointment:   management of chronic problems    History of Present Illness   Diagnosis: Type 2 DIABETES MELITUS, date of diagnosis:  1997   He has been on various regimens for his diabetes and has been on bedtime insulin with NPH since 2005 He also has benefited from adding Victoza in 2011 with better postprandial control and some weight loss Previously his weight has been as much as 247 pounds  On Victoza  since 11/14 with further improvement in blood sugar control. Since about 2015 his blood sugars have been overall mildly high with A1c over 7% consistently.  RECENT HISTORY:  His A1c is staying about the same between 7.3-7.5 on the last 3 measurements  Oral hypoglycemic drugs: Amaryl 2 mg in the a.m. and metformin 500 mg twice a day         Side effects from medications: None  Insulin regimen: NPH 24 units at bedtime and Humalog 8 units at suppertime    Current management, blood sugar patterns and problems identified:  Although he was supposed to take 10-12 units of Humalog and adjusted based on what he is eating he is only taking 8 units  Occasionally will have readings over 200 after meals from eating more carbohydrate or larger meals  Also sometimes when going out to eat he will not take his insulin with him  More recently he is trying to cut back on calories and sometimes eating only soup at suppertime without adjusting his insulin  Otherwise not always restricting high fat foods also  However most of his blood sugars are relatively high  Not clear if some of his readings in the evenings are before eating since these are at variable times  Weight is stable  Still not able to exercise because of continued knee problems  He is very regular with taking his Victoza daily  However at times when his sugars are higher at bedtime he will increase his NPH also by 2  units  No hypoglycemia  Monitors blood glucose:about 2x a day.    Glucometer:  Accu-Chek         Blood Glucose readings from meter download:   Mean values apply above for all meters except median for One Touch  PRE-MEAL Fasting Lunch Dinner Bedtime Overall  Glucose range:  74-133   128    Mean/median:  104     139   POST-MEAL PC Breakfast PC Lunch PC Dinner  Glucose range:    109-328  Mean/median:    180   Previous readings:  Mean values apply above for all meters except median for One Touch  PRE-MEAL Fasting Lunch Dinner Bedtime Overall  Glucose range: 89-1 33    10 9-217    Mean/median: 107  165 158 133    Physical activity: exercise: Unable to do much activity because of  knee pain  Meal times: Dinner about 5-7 PM  Wt Readings from Last 3 Encounters:  11/03/17 235 lb (106.6 kg)  11/01/17 238 lb 4.8 oz (108.1 kg)  10/27/17 239 lb 6.4 oz (108.6 kg)   LABS: Lab Results  Component Value Date   HGBA1C 7.5 (H) 10/31/2017   HGBA1C 7.3 (H) 05/02/2017   HGBA1C 7.4 (H) 03/11/2017   Lab Results  Component Value Date   MICROALBUR 22.8 (H) 10/31/2017   LDLCALC 91 05/02/2017   CREATININE 1.06  10/31/2017       OTHER active problems are discussed in review of systems    Allergies as of 11/03/2017      Reactions   Morphine And Related Nausea And Vomiting      Medication List        Accurate as of 11/03/17 11:00 AM. Always use your most recent med list.          ACCU-CHEK AVIVA PLUS test strip Generic drug:  glucose blood USE AS INSTRUCTED TO CHECK BLOOD SUGAR TWICE A DAY   acetaminophen 500 MG tablet Commonly known as:  TYLENOL Take 500 mg by mouth every 4 (four) hours as needed for moderate pain or headache.   amLODipine 2.5 MG tablet Commonly known as:  NORVASC Take 2.5 mg by mouth daily.   aspirin EC 81 MG tablet Take 81 mg by mouth daily.   B-D INS SYR ULTRAFINE 1CC/31G 31G X 5/16" 1 ML Misc Generic drug:  Insulin Syringe-Needle U-100 USE TO  INJECT INSULIN DAILY   clopidogrel 75 MG tablet Commonly known as:  PLAVIX Take 1 tablet (75 mg total) by mouth daily with breakfast.   ferrous sulfate 325 (65 FE) MG tablet Take 325 mg by mouth daily with breakfast.   fish oil-omega-3 fatty acids 1000 MG capsule Take 1 g by mouth daily at 12 noon.   furosemide 20 MG tablet Commonly known as:  LASIX Take 1 tablet (20 mg total) by mouth daily.   glimepiride 2 MG tablet Commonly known as:  AMARYL Take 0.5 tablets (1 mg total) by mouth daily.   HUMALOG 100 UNIT/ML injection Generic drug:  insulin lispro Inject 4 to 10 units into the skin three (3) times daily with meals as directed.   insulin NPH Human 100 UNIT/ML injection Commonly known as:  HUMULIN N,NOVOLIN N Inject 0.26 mLs (26 Units total) into the skin at bedtime. Uses Vial   Insulin Pen Needle 31G X 5 MM Misc Commonly known as:  B-D UF III MINI PEN NEEDLES USE 2 PEN NEEDLE PER DAY WITH HUMALOG AND VICTOZA   liraglutide 18 MG/3ML Sopn Commonly known as:  VICTOZA Inject 0.3 mLs (1.8 mg total) into the skin at bedtime.   metFORMIN 500 MG (MOD) 24 hr tablet Commonly known as:  GLUMETZA Take 1 tablet (500 mg total) by mouth 2 (two) times daily with a meal. (morning & noon)   multivitamin with minerals Tabs tablet Take 1 tablet by mouth daily.   olmesartan 40 MG tablet Commonly known as:  BENICAR Take 1 tablet (40 mg total) by mouth daily.   pravastatin 80 MG tablet Commonly known as:  PRAVACHOL Take 1 tablet (80 mg total) by mouth every evening.   PRESERVISION AREDS 2 PO Take 1 capsule by mouth 2 (two) times daily.   tamsulosin 0.4 MG Caps capsule Commonly known as:  FLOMAX Take 1 capsule daily   vitamin C 500 MG tablet Commonly known as:  ASCORBIC ACID Take 500 mg by mouth daily.       Allergies:  Allergies  Allergen Reactions  . Morphine And Related Nausea And Vomiting    Past Medical History:  Diagnosis Date  . Anemia   . Arthritis   . CAD  (coronary artery disease)    a. cardiac cath 12/2016 showing moderate AS, elevated LVEDP, heavy 3V coronary calcification, 100% mD2, 30-50% LAD, 50-90% stenosis of Cx proximal to origin of L-PDA, RCA not engaged due to poor catheter control (difficult procedure) - coronary  status was essentially unchanged from prior.  . Chronic diastolic CHF (congestive heart failure) ([place])   . Degenerative arthritis   . Diabetes ([place])   . Dyspnea   . Essential hypertension   . Hyperlipidemia   . Obesity (BMI 30-39.9) 06/18/2015  . OSA (obstructive sleep apnea)    Severe with AHI 27/hr now on CPAP  no cpap  . Peritonitis ([place]) 1985?  [name] Pneumonia    "6 months - 84 years old"  . Pure hypercholesterolemia   . RBBB   . [facility] spotted fever   . 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 Severe aortic stenosis    a. s/p TAVR 03/2017.  . Type II or unspecified type diabetes mellitus without mention of complication, not stated as uncontrolled     Past Surgical History:  Procedure Laterality Date  . APPENDECTOMY    . CARPAL TUNNEL RELEASE    . EYE SURGERY Bilateral    cataracts removed, lens placed  . KNEE CARTILAGE SURGERY    . PARTIAL COLECTOMY    . RIGHT/LEFT HEART CATH AND CORONARY ANGIOGRAPHY N/A 12/28/2016   Procedure: Right/Left Heart Cath and Coronary Angiography;  Surgeon: Belva Crome, MD;  Location: [facility];  Service: Cardiovascular;  Laterality: N/A;  . TEE WITHOUT CARDIOVERSION N/A 03/15/2017   Procedure: TRANSESOPHAGEAL ECHOCARDIOGRAM (TEE);  Surgeon: Burnell Blanks, MD;  Location: [facility];  Service: Open Heart Surgery;  Laterality: N/A;  . TRANSCATHETER AORTIC VALVE REPLACEMENT, TRANSFEMORAL N/A 03/15/2017   Procedure: TRANSCATHETER AORTIC VALVE REPLACEMENT, TRANSFEMORAL;  Surgeon: Burnell Blanks, MD;  Location: [facility];  Service: Open Heart  Surgery;  Laterality: N/A;  . VEIN SURGERY      Family History  Problem Relation Age of Onset  . Heart failure Father   . Emphysema Father   . Hypertension Mother   . Cancer Sister   . Healthy Sister     Social History:  reports that  has never smoked. he has never used smokeless tobacco. He reports that he does not drink alcohol or use drugs.  Review of Systems:  HYPERTENSION:   Currently being treated with Benicar 40 and 2.5 mg amlodipine Blood pressure is higher today but was fairly good with the cardiologist recently  He has not checked blood pressure at home   BP Readings from Last 3 Encounters:  11/03/17 (!) 160/70  11/01/17 (!) 138/59  10/27/17 (!) 174/80     CARDIAC history:  No recent shortness of breath on exertion, this has improved since his aortic valve replacement He is now only taking 20 mg Lasix, previously on as much as 80 mg   HYPERLIPIDEMIA: The lipid abnormality consists of elevated LDL and he is taking 40 mg Pravachol, no side effects. LDL is 91 on the last measurement  Lab Results  Component Value Date   CHOL 175 05/02/2017   HDL 46.60 05/02/2017   LDLCALC 91 05/02/2017   LDLDIRECT 101.0 04/26/2016   TRIG 184.0 (H) 05/02/2017   CHOLHDL 4 05/02/2017    History of daytime fatigability and somnolence Secondary to sleep apnea.   He is compliant with his CPAP treatment   IRON deficiency anemia:   His hemoglobin tends to be mildly decreased consistently He has been told to take vitamin C along with the iron and now hemoglobin stable around 12.4-12.5    No history of B-12  deficiency   CBC Latest Ref Rng & Units 11/01/2017 10/31/2017 07/01/2017  WBC 4.0 - 10.5 K/uL 7.9 6.9 7.5  Hemoglobin 13.0 - 17.0 g/dL 12.5(L) 12.4(L) 11.9(L)  Hematocrit 39.0 - 52.0 % 38.1(L) 37.1(L) 36.4(L)  Platelets 150 - 400 K/uL 174 151.0 153.0   Foot exam last done in 04/2017  ROS    Physical Exam    BP (!) 160/70   Pulse 65   Ht 5\' 8"  (1.727 m)   Wt  235 lb (106.6 kg)   SpO2 97%   BMI 35.73 kg/m    Blood pressure check twice No ankle edema present  ASSESSMENT/ PLAN:   Diabetes type 2 with obesity  See history of present illness for detailed discussion of his current management, blood sugar patterns and problems identified  His A1c is reasonably good at 7.5  He is however getting high readings after evening meal frequently  On a regimen of Victoza, metformin, NPH insulin at bedtime and Humalog at his main meal   Fasting readings are excellent and he will take 24 units of NPH at night consistently  Discussed that he does need to go up to 10 or 12 units of Humalog at suppertime based on what he is eating unless he is eating a very light meal Also reminded him to take his insulin with him when planning to eat out in the evening  HYPERTENSION: Blood pressure is variable but may be anxious today For now continue the same regimen    [name] 11/03/2017, 11:00 AM

## 2017-11-07 ENCOUNTER — Telehealth: Payer: Self-pay

## 2017-11-07 MED ORDER — TRANEXAMIC ACID 1000 MG/10ML IV SOLN
2000.0000 mg | INTRAVENOUS | Status: AC
  Administered 2017-11-08: 2000 mg via TOPICAL
  Filled 2017-11-07: qty 20

## 2017-11-07 MED ORDER — CEFAZOLIN SODIUM-DEXTROSE 2-4 GM/100ML-% IV SOLN
2.0000 g | INTRAVENOUS | Status: AC
  Administered 2017-11-08: 2 g via INTRAVENOUS
  Filled 2017-11-07: qty 100

## 2017-11-07 MED ORDER — TRANEXAMIC ACID 1000 MG/10ML IV SOLN
1000.0000 mg | INTRAVENOUS | Status: DC
  Filled 2017-11-07: qty 10

## 2017-11-07 MED ORDER — LACTATED RINGERS IV SOLN: INTRAVENOUS | Status: DC

## 2017-11-07 NOTE — Telephone Encounter (Signed)
PA started for Victoza KEY: [id number]

## 2017-11-08 ENCOUNTER — Inpatient Hospital Stay (HOSPITAL_COMMUNITY)
Admission: RE | Admit: 2017-11-08 | Discharge: 2017-11-11 | DRG: 470 | Disposition: A | Discharge status: Home Health | Payer: Medicare Other | Source: Ambulatory Visit | Attending: Orthopaedic Surgery | Admitting: Orthopaedic Surgery

## 2017-11-08 ENCOUNTER — Encounter (HOSPITAL_COMMUNITY): Payer: Self-pay | Admitting: *Deleted

## 2017-11-08 ENCOUNTER — Inpatient Hospital Stay (HOSPITAL_COMMUNITY): Payer: Medicare Other | Admitting: Certified Registered"

## 2017-11-08 ENCOUNTER — Inpatient Hospital Stay (HOSPITAL_COMMUNITY): Payer: Medicare Other | Admitting: Emergency Medicine

## 2017-11-08 ENCOUNTER — Encounter (HOSPITAL_COMMUNITY)
Admission: RE | Disposition: A | Discharge status: Home Health | Payer: Self-pay | Source: Ambulatory Visit | Attending: Orthopaedic Surgery

## 2017-11-08 ENCOUNTER — Other Ambulatory Visit: Payer: Self-pay

## 2017-11-08 DIAGNOSIS — E669 Obesity, unspecified: Secondary | ICD-10-CM | POA: Diagnosis present

## 2017-11-08 DIAGNOSIS — I2584 Coronary atherosclerosis due to calcified coronary lesion: Secondary | ICD-10-CM | POA: Diagnosis not present

## 2017-11-08 DIAGNOSIS — Z794 Long term (current) use of insulin: Secondary | ICD-10-CM | POA: Diagnosis not present

## 2017-11-08 DIAGNOSIS — G4733 Obstructive sleep apnea (adult) (pediatric): Secondary | ICD-10-CM | POA: Diagnosis present

## 2017-11-08 DIAGNOSIS — Z825 Family history of asthma and other chronic lower respiratory diseases: Secondary | ICD-10-CM

## 2017-11-08 DIAGNOSIS — Z7982 Long term (current) use of aspirin: Secondary | ICD-10-CM

## 2017-11-08 DIAGNOSIS — Z961 Presence of intraocular lens: Secondary | ICD-10-CM | POA: Diagnosis present

## 2017-11-08 DIAGNOSIS — E119 Type 2 diabetes mellitus without complications: Secondary | ICD-10-CM | POA: Diagnosis not present

## 2017-11-08 DIAGNOSIS — Z9842 Cataract extraction status, left eye: Secondary | ICD-10-CM

## 2017-11-08 DIAGNOSIS — E785 Hyperlipidemia, unspecified: Secondary | ICD-10-CM | POA: Diagnosis not present

## 2017-11-08 DIAGNOSIS — M25761 Osteophyte, right knee: Secondary | ICD-10-CM | POA: Diagnosis not present

## 2017-11-08 DIAGNOSIS — I110 Hypertensive heart disease with heart failure: Secondary | ICD-10-CM | POA: Diagnosis not present

## 2017-11-08 DIAGNOSIS — Z79899 Other long term (current) drug therapy: Secondary | ICD-10-CM | POA: Diagnosis not present

## 2017-11-08 DIAGNOSIS — Z9841 Cataract extraction status, right eye: Secondary | ICD-10-CM | POA: Diagnosis not present

## 2017-11-08 DIAGNOSIS — I2510 Atherosclerotic heart disease of native coronary artery without angina pectoris: Secondary | ICD-10-CM | POA: Diagnosis present

## 2017-11-08 DIAGNOSIS — Z885 Allergy status to narcotic agent status: Secondary | ICD-10-CM

## 2017-11-08 DIAGNOSIS — Z7902 Long term (current) use of antithrombotics/antiplatelets: Secondary | ICD-10-CM | POA: Diagnosis not present

## 2017-11-08 DIAGNOSIS — M25561 Pain in right knee: Secondary | ICD-10-CM | POA: Diagnosis present

## 2017-11-08 DIAGNOSIS — M1711 Unilateral primary osteoarthritis, right knee: Principal | ICD-10-CM | POA: Diagnosis present

## 2017-11-08 DIAGNOSIS — I5032 Chronic diastolic (congestive) heart failure: Secondary | ICD-10-CM | POA: Diagnosis not present

## 2017-11-08 DIAGNOSIS — Z6836 Body mass index (BMI) 36.0-36.9, adult: Secondary | ICD-10-CM | POA: Diagnosis not present

## 2017-11-08 DIAGNOSIS — Z954 Presence of other heart-valve replacement: Secondary | ICD-10-CM

## 2017-11-08 DIAGNOSIS — Z8249 Family history of ischemic heart disease and other diseases of the circulatory system: Secondary | ICD-10-CM | POA: Diagnosis not present

## 2017-11-08 HISTORY — PX: TOTAL KNEE ARTHROPLASTY: SHX125

## 2017-11-08 LAB — GLUCOSE, CAPILLARY
GLUCOSE-CAPILLARY: 115 mg/dL — AB (ref 65–99)
GLUCOSE-CAPILLARY: 277 mg/dL — AB (ref 65–99)
Glucose-Capillary: 142 mg/dL — ABNORMAL HIGH (ref 65–99)
Glucose-Capillary: 113 mg/dL — ABNORMAL HIGH (ref 65–99)

## 2017-11-08 SURGERY — ARTHROPLASTY, KNEE, TOTAL
Anesthesia: Regional | Site: Knee | Laterality: Right

## 2017-11-08 MED ORDER — PHENYLEPHRINE HCL 10 MG/ML IJ SOLN
INTRAVENOUS | Status: DC | PRN
  Administered 2017-11-08: 30 ug/min via INTRAVENOUS

## 2017-11-08 MED ORDER — SODIUM CHLORIDE 0.9% FLUSH
INTRAVENOUS | Status: DC | PRN
  Administered 2017-11-08: 30 mL

## 2017-11-08 MED ORDER — SODIUM CHLORIDE 0.9 % IR SOLN
Status: DC | PRN
  Administered 2017-11-08: 3000 mL

## 2017-11-08 MED ORDER — HYDROCODONE-ACETAMINOPHEN 5-325 MG PO TABS
1.0000 | ORAL_TABLET | ORAL | Status: DC | PRN
  Filled 2017-11-08: qty 2

## 2017-11-08 MED ORDER — ACETAMINOPHEN 500 MG PO TABS
500.0000 mg | ORAL_TABLET | Freq: Four times a day (QID) | ORAL | Status: AC
  Administered 2017-11-08 (×2): 500 mg via ORAL
  Filled 2017-11-08 (×2): qty 1

## 2017-11-08 MED ORDER — MEPERIDINE HCL 50 MG/ML IJ SOLN: 6.2500 mg | INTRAMUSCULAR | Status: DC | PRN

## 2017-11-08 MED ORDER — SODIUM CHLORIDE 0.9 % IV SOLN
INTRAVENOUS | Status: DC | PRN
  Administered 2017-11-08: 1000 mg via INTRAVENOUS

## 2017-11-08 MED ORDER — DIPHENHYDRAMINE HCL 12.5 MG/5ML PO ELIX: 12.5000 mg | ORAL_SOLUTION | ORAL | Status: DC | PRN

## 2017-11-08 MED ORDER — KETOROLAC TROMETHAMINE 15 MG/ML IJ SOLN
7.5000 mg | Freq: Four times a day (QID) | INTRAMUSCULAR | Status: AC
  Administered 2017-11-08 (×2): 7.5 mg via INTRAVENOUS
  Filled 2017-11-08 (×2): qty 1

## 2017-11-08 MED ORDER — PROPOFOL 10 MG/ML IV BOLUS
INTRAVENOUS | Status: DC | PRN
  Administered 2017-11-08 (×3): 20 mg via INTRAVENOUS

## 2017-11-08 MED ORDER — ONDANSETRON HCL 4 MG/2ML IJ SOLN: 4.0000 mg | Freq: Four times a day (QID) | INTRAMUSCULAR | Status: DC | PRN

## 2017-11-08 MED ORDER — ONDANSETRON HCL 4 MG/2ML IJ SOLN
INTRAMUSCULAR | Status: AC
  Filled 2017-11-08: qty 2

## 2017-11-08 MED ORDER — ONDANSETRON HCL 4 MG/2ML IJ SOLN
INTRAMUSCULAR | Status: DC | PRN
  Administered 2017-11-08: 4 mg via INTRAVENOUS

## 2017-11-08 MED ORDER — PROMETHAZINE HCL 25 MG PO TABS: 12.5000 mg | ORAL_TABLET | Freq: Four times a day (QID) | ORAL | Status: DC | PRN

## 2017-11-08 MED ORDER — PHENOL 1.4 % MT LIQD: 1.0000 | OROMUCOSAL | Status: DC | PRN

## 2017-11-08 MED ORDER — BUPIVACAINE LIPOSOME 1.3 % IJ SUSP
20.0000 mL | INTRAMUSCULAR | Status: AC
  Administered 2017-11-08: 20 mL
  Filled 2017-11-08: qty 20

## 2017-11-08 MED ORDER — CEFAZOLIN SODIUM-DEXTROSE 2-4 GM/100ML-% IV SOLN
2.0000 g | Freq: Four times a day (QID) | INTRAVENOUS | Status: AC
  Administered 2017-11-08 (×2): 2 g via INTRAVENOUS
  Filled 2017-11-08 (×2): qty 100

## 2017-11-08 MED ORDER — PHENYLEPHRINE 40 MCG/ML (10ML) SYRINGE FOR IV PUSH (FOR BLOOD PRESSURE SUPPORT)
PREFILLED_SYRINGE | INTRAVENOUS | Status: DC | PRN
  Administered 2017-11-08 (×2): 80 ug via INTRAVENOUS

## 2017-11-08 MED ORDER — INSULIN ASPART 100 UNIT/ML ~~LOC~~ SOLN
0.0000 [IU] | Freq: Three times a day (TID) | SUBCUTANEOUS | Status: DC
  Administered 2017-11-08: 11 [IU] via SUBCUTANEOUS
  Administered 2017-11-09: 4 [IU] via SUBCUTANEOUS
  Administered 2017-11-09: 3 [IU] via SUBCUTANEOUS
  Administered 2017-11-09: 7 [IU] via SUBCUTANEOUS
  Administered 2017-11-10 (×2): 4 [IU] via SUBCUTANEOUS
  Administered 2017-11-10: 3 [IU] via SUBCUTANEOUS
  Administered 2017-11-11: 4 [IU] via SUBCUTANEOUS

## 2017-11-08 MED ORDER — BISACODYL 5 MG PO TBEC: 5.0000 mg | DELAYED_RELEASE_TABLET | Freq: Every day | ORAL | Status: DC | PRN

## 2017-11-08 MED ORDER — METFORMIN HCL ER 500 MG PO TB24
500.0000 mg | ORAL_TABLET | Freq: Two times a day (BID) | ORAL | Status: DC
  Administered 2017-11-08 – 2017-11-11 (×6): 500 mg via ORAL
  Filled 2017-11-08 (×6): qty 1

## 2017-11-08 MED ORDER — BUPIVACAINE IN DEXTROSE 0.75-8.25 % IT SOLN
INTRATHECAL | Status: DC | PRN
  Administered 2017-11-08: 2 mL via INTRATHECAL

## 2017-11-08 MED ORDER — METOCLOPRAMIDE HCL 5 MG PO TABS: 5.0000 mg | ORAL_TABLET | Freq: Three times a day (TID) | ORAL | Status: DC | PRN

## 2017-11-08 MED ORDER — PROMETHAZINE HCL 25 MG/ML IJ SOLN: 6.2500 mg | Freq: Four times a day (QID) | INTRAMUSCULAR | Status: DC | PRN

## 2017-11-08 MED ORDER — 0.9 % SODIUM CHLORIDE (POUR BTL) OPTIME
TOPICAL | Status: DC | PRN
  Administered 2017-11-08: 1000 mL

## 2017-11-08 MED ORDER — ASPIRIN EC 325 MG PO TBEC
325.0000 mg | DELAYED_RELEASE_TABLET | Freq: Two times a day (BID) | ORAL | Status: DC
  Administered 2017-11-09 – 2017-11-11 (×5): 325 mg via ORAL
  Filled 2017-11-08 (×5): qty 1

## 2017-11-08 MED ORDER — BUPIVACAINE-EPINEPHRINE (PF) 0.5% -1:200000 IJ SOLN
INTRAMUSCULAR | Status: AC
  Filled 2017-11-08: qty 30

## 2017-11-08 MED ORDER — FENTANYL CITRATE (PF) 250 MCG/5ML IJ SOLN
INTRAMUSCULAR | Status: AC
  Filled 2017-11-08: qty 5

## 2017-11-08 MED ORDER — CHLORHEXIDINE GLUCONATE 4 % EX LIQD: 60.0000 mL | Freq: Once | CUTANEOUS | Status: DC

## 2017-11-08 MED ORDER — INSULIN NPH (HUMAN) (ISOPHANE) 100 UNIT/ML ~~LOC~~ SUSP
26.0000 [IU] | Freq: Every day | SUBCUTANEOUS | Status: DC
  Administered 2017-11-08 – 2017-11-10 (×3): 26 [IU] via SUBCUTANEOUS
  Filled 2017-11-08: qty 10

## 2017-11-08 MED ORDER — EPHEDRINE SULFATE-NACL 50-0.9 MG/10ML-% IV SOSY
PREFILLED_SYRINGE | INTRAVENOUS | Status: DC | PRN
  Administered 2017-11-08: 5 mg via INTRAVENOUS

## 2017-11-08 MED ORDER — HYDROMORPHONE HCL 1 MG/ML IJ SOLN: 0.2500 mg | INTRAMUSCULAR | Status: DC | PRN

## 2017-11-08 MED ORDER — METHOCARBAMOL 1000 MG/10ML IJ SOLN
500.0000 mg | Freq: Four times a day (QID) | INTRAVENOUS | Status: DC | PRN
  Filled 2017-11-08: qty 5

## 2017-11-08 MED ORDER — FENTANYL CITRATE (PF) 100 MCG/2ML IJ SOLN
INTRAMUSCULAR | Status: AC
  Administered 2017-11-08: 50 ug via INTRAVENOUS
  Filled 2017-11-08: qty 2

## 2017-11-08 MED ORDER — METOCLOPRAMIDE HCL 5 MG/ML IJ SOLN: 5.0000 mg | Freq: Three times a day (TID) | INTRAMUSCULAR | Status: DC | PRN

## 2017-11-08 MED ORDER — ACETAMINOPHEN 325 MG PO TABS
325.0000 mg | ORAL_TABLET | Freq: Four times a day (QID) | ORAL | Status: DC | PRN
  Administered 2017-11-09: 650 mg via ORAL
  Filled 2017-11-08: qty 2

## 2017-11-08 MED ORDER — CLOPIDOGREL BISULFATE 75 MG PO TABS
75.0000 mg | ORAL_TABLET | Freq: Every day | ORAL | Status: DC
  Administered 2017-11-09 – 2017-11-11 (×3): 75 mg via ORAL
  Filled 2017-11-08 (×3): qty 1

## 2017-11-08 MED ORDER — PHENYLEPHRINE 40 MCG/ML (10ML) SYRINGE FOR IV PUSH (FOR BLOOD PRESSURE SUPPORT)
PREFILLED_SYRINGE | INTRAVENOUS | Status: AC
  Filled 2017-11-08: qty 10

## 2017-11-08 MED ORDER — TRANEXAMIC ACID 1000 MG/10ML IV SOLN
1000.0000 mg | Freq: Once | INTRAVENOUS | Status: AC
  Administered 2017-11-08: 1000 mg via INTRAVENOUS
  Filled 2017-11-08: qty 10

## 2017-11-08 MED ORDER — IRBESARTAN 300 MG PO TABS
300.0000 mg | ORAL_TABLET | Freq: Every day | ORAL | Status: DC
  Administered 2017-11-08 – 2017-11-11 (×4): 300 mg via ORAL
  Filled 2017-11-08 (×4): qty 1

## 2017-11-08 MED ORDER — ONDANSETRON HCL 4 MG/2ML IJ SOLN: 4.0000 mg | Freq: Once | INTRAMUSCULAR | Status: DC | PRN

## 2017-11-08 MED ORDER — EPHEDRINE 5 MG/ML INJ
INTRAVENOUS | Status: AC
  Filled 2017-11-08: qty 10

## 2017-11-08 MED ORDER — HYDROMORPHONE HCL 1 MG/ML IJ SOLN: 0.5000 mg | INTRAMUSCULAR | Status: DC | PRN

## 2017-11-08 MED ORDER — MENTHOL 3 MG MT LOZG: 1.0000 | LOZENGE | OROMUCOSAL | Status: DC | PRN

## 2017-11-08 MED ORDER — TAMSULOSIN HCL 0.4 MG PO CAPS
0.4000 mg | ORAL_CAPSULE | Freq: Every day | ORAL | Status: DC
  Administered 2017-11-08 – 2017-11-11 (×4): 0.4 mg via ORAL
  Filled 2017-11-08 (×4): qty 1

## 2017-11-08 MED ORDER — ONDANSETRON HCL 4 MG PO TABS: 4.0000 mg | ORAL_TABLET | Freq: Four times a day (QID) | ORAL | Status: DC | PRN

## 2017-11-08 MED ORDER — ALUM & MAG HYDROXIDE-SIMETH 200-200-20 MG/5ML PO SUSP: 30.0000 mL | ORAL | Status: DC | PRN

## 2017-11-08 MED ORDER — LIRAGLUTIDE 18 MG/3ML ~~LOC~~ SOPN: 1.8000 mg | PEN_INJECTOR | Freq: Every day | SUBCUTANEOUS | Status: DC

## 2017-11-08 MED ORDER — METHOCARBAMOL 500 MG PO TABS
500.0000 mg | ORAL_TABLET | Freq: Four times a day (QID) | ORAL | Status: DC | PRN
  Administered 2017-11-08 – 2017-11-11 (×6): 500 mg via ORAL
  Filled 2017-11-08 (×6): qty 1

## 2017-11-08 MED ORDER — BUPIVACAINE-EPINEPHRINE (PF) 0.5% -1:200000 IJ SOLN
INTRAMUSCULAR | Status: DC | PRN
  Administered 2017-11-08: 30 mL

## 2017-11-08 MED ORDER — LIDOCAINE 2% (20 MG/ML) 5 ML SYRINGE
INTRAMUSCULAR | Status: DC | PRN
  Administered 2017-11-08: 50 mg via INTRAVENOUS

## 2017-11-08 MED ORDER — PROPOFOL 500 MG/50ML IV EMUL
INTRAVENOUS | Status: DC | PRN
  Administered 2017-11-08: 50 ug/kg/min via INTRAVENOUS

## 2017-11-08 MED ORDER — FUROSEMIDE 20 MG PO TABS
20.0000 mg | ORAL_TABLET | Freq: Every day | ORAL | Status: DC
  Administered 2017-11-08 – 2017-11-11 (×4): 20 mg via ORAL
  Filled 2017-11-08 (×4): qty 1

## 2017-11-08 MED ORDER — MIDAZOLAM HCL 2 MG/2ML IJ SOLN
2.0000 mg | Freq: Once | INTRAMUSCULAR | Status: AC
  Administered 2017-11-08: 2 mg via INTRAVENOUS

## 2017-11-08 MED ORDER — GLIMEPIRIDE 1 MG PO TABS
1.0000 mg | ORAL_TABLET | Freq: Every day | ORAL | Status: DC
  Administered 2017-11-08 – 2017-11-11 (×4): 1 mg via ORAL
  Filled 2017-11-08 (×4): qty 1

## 2017-11-08 MED ORDER — DOCUSATE SODIUM 100 MG PO CAPS
100.0000 mg | ORAL_CAPSULE | Freq: Two times a day (BID) | ORAL | Status: DC
  Administered 2017-11-08 – 2017-11-11 (×7): 100 mg via ORAL
  Filled 2017-11-08 (×7): qty 1

## 2017-11-08 MED ORDER — LIDOCAINE 2% (20 MG/ML) 5 ML SYRINGE
INTRAMUSCULAR | Status: AC
  Filled 2017-11-08: qty 5

## 2017-11-08 MED ORDER — MIDAZOLAM HCL 2 MG/2ML IJ SOLN
INTRAMUSCULAR | Status: AC
  Administered 2017-11-08: 2 mg via INTRAVENOUS
  Filled 2017-11-08: qty 2

## 2017-11-08 MED ORDER — HYDROCODONE-ACETAMINOPHEN 7.5-325 MG PO TABS
1.0000 | ORAL_TABLET | ORAL | Status: DC | PRN
  Administered 2017-11-08 – 2017-11-09 (×2): 1 via ORAL
  Administered 2017-11-09 – 2017-11-11 (×7): 2 via ORAL
  Filled 2017-11-08 (×6): qty 2
  Filled 2017-11-08: qty 1
  Filled 2017-11-08: qty 2
  Filled 2017-11-08: qty 1

## 2017-11-08 MED ORDER — PRAVASTATIN SODIUM 40 MG PO TABS
80.0000 mg | ORAL_TABLET | Freq: Every evening | ORAL | Status: DC
  Administered 2017-11-08 – 2017-11-10 (×3): 80 mg via ORAL
  Filled 2017-11-08 (×3): qty 2

## 2017-11-08 MED ORDER — LACTATED RINGERS IV SOLN
INTRAVENOUS | Status: DC | PRN
  Administered 2017-11-08 (×2): via INTRAVENOUS

## 2017-11-08 MED ORDER — LACTATED RINGERS IV SOLN
INTRAVENOUS | Status: DC
  Administered 2017-11-08: 09:00:00 via INTRAVENOUS

## 2017-11-08 MED ORDER — AMLODIPINE BESYLATE 2.5 MG PO TABS
2.5000 mg | ORAL_TABLET | Freq: Every day | ORAL | Status: DC
  Administered 2017-11-09 – 2017-11-11 (×3): 2.5 mg via ORAL
  Filled 2017-11-08 (×3): qty 1

## 2017-11-08 MED ORDER — LACTATED RINGERS IV SOLN
INTRAVENOUS | Status: DC
  Administered 2017-11-08: 15:00:00 via INTRAVENOUS

## 2017-11-08 MED ORDER — FENTANYL CITRATE (PF) 100 MCG/2ML IJ SOLN
50.0000 ug | Freq: Once | INTRAMUSCULAR | Status: AC
  Administered 2017-11-08: 50 ug via INTRAVENOUS

## 2017-11-08 SURGICAL SUPPLY — 57 items
BAG DECANTER FOR FLEXI CONT (MISCELLANEOUS) IMPLANT
BANDAGE ACE 6X5 VEL STRL LF (GAUZE/BANDAGES/DRESSINGS) ×2 IMPLANT
BANDAGE ESMARK 6X9 LF (GAUZE/BANDAGES/DRESSINGS) ×1 IMPLANT
BLADE SAGITTAL 25.0X1.19X90 (BLADE) IMPLANT
BLADE SAGITTAL 25.0X1.19X90MM (BLADE)
BLADE SAW SGTL 13.0X1.19X90.0M (BLADE) IMPLANT
BNDG CMPR 9X6 STRL LF SNTH (GAUZE/BANDAGES/DRESSINGS) ×1
BNDG CMPR MED 10X6 ELC LF (GAUZE/BANDAGES/DRESSINGS) ×1
BNDG ELASTIC 6X10 VLCR STRL LF (GAUZE/BANDAGES/DRESSINGS) ×3 IMPLANT
BNDG ESMARK 6X9 LF (GAUZE/BANDAGES/DRESSINGS) ×3
BOWL SMART MIX CTS (DISPOSABLE) ×3 IMPLANT
CAP KNEE TOTAL 3 SIGMA ×2 IMPLANT
CEMENT HV SMART SET (Cement) ×6 IMPLANT
CLOSURE WOUND 1/2 X4 (GAUZE/BANDAGES/DRESSINGS) ×1
COVER SURGICAL LIGHT HANDLE (MISCELLANEOUS) ×3 IMPLANT
CUFF TOURNIQUET SINGLE 34IN LL (TOURNIQUET CUFF) ×3 IMPLANT
CUFF TOURNIQUET SINGLE 44IN (TOURNIQUET CUFF) IMPLANT
DECANTER SPIKE VIAL GLASS SM (MISCELLANEOUS) ×3 IMPLANT
DRAPE EXTREMITY T 121X128X90 (DRAPE) ×3 IMPLANT
DRAPE HALF SHEET 40X57 (DRAPES) ×6 IMPLANT
DRAPE U-SHAPE 47X51 STRL (DRAPES) ×3 IMPLANT
DRSG AQUACEL AG ADV 3.5X10 (GAUZE/BANDAGES/DRESSINGS) ×3 IMPLANT
DURAPREP 26ML APPLICATOR (WOUND CARE) ×3 IMPLANT
ELECT REM PT RETURN 9FT ADLT (ELECTROSURGICAL) ×3
ELECTRODE REM PT RTRN 9FT ADLT (ELECTROSURGICAL) ×1 IMPLANT
GLOVE BIO SURGEON STRL SZ8 (GLOVE) ×6 IMPLANT
GLOVE BIOGEL PI IND STRL 8 (GLOVE) ×2 IMPLANT
GLOVE BIOGEL PI INDICATOR 8 (GLOVE) ×4
GOWN STRL REUS W/ TWL LRG LVL3 (GOWN DISPOSABLE) ×1 IMPLANT
GOWN STRL REUS W/ TWL XL LVL3 (GOWN DISPOSABLE) ×2 IMPLANT
GOWN STRL REUS W/TWL LRG LVL3 (GOWN DISPOSABLE) ×3
GOWN STRL REUS W/TWL XL LVL3 (GOWN DISPOSABLE) ×6
HANDPIECE INTERPULSE COAX TIP (DISPOSABLE) ×3
HOOD PEEL AWAY FACE SHEILD DIS (HOOD) ×6 IMPLANT
IMMOBILIZER KNEE 22 (SOFTGOODS) ×2 IMPLANT
IMMOBILIZER KNEE 22 UNIV (SOFTGOODS) ×3 IMPLANT
KIT BASIN OR (CUSTOM PROCEDURE TRAY) ×3 IMPLANT
KIT ROOM TURNOVER OR (KITS) ×3 IMPLANT
MANIFOLD NEPTUNE II (INSTRUMENTS) ×3 IMPLANT
NDL HYPO 21X1 ECLIPSE (NEEDLE) ×1 IMPLANT
NEEDLE HYPO 21X1 ECLIPSE (NEEDLE) ×3 IMPLANT
NS IRRIG 1000ML POUR BTL (IV SOLUTION) ×3 IMPLANT
PACK TOTAL JOINT (CUSTOM PROCEDURE TRAY) ×3 IMPLANT
PAD ARMBOARD 7.5X6 YLW CONV (MISCELLANEOUS) ×6 IMPLANT
PIN STEINMAN FIXATION KNEE (PIN) IMPLANT
SET HNDPC FAN SPRY TIP SCT (DISPOSABLE) ×1 IMPLANT
STRIP CLOSURE SKIN 1/2X4 (GAUZE/BANDAGES/DRESSINGS) ×2 IMPLANT
SUT VIC AB 0 CT1 27 (SUTURE) ×3
SUT VIC AB 0 CT1 27XBRD ANBCTR (SUTURE) ×1 IMPLANT
SUT VIC AB 2-0 CT1 27 (SUTURE) ×3
SUT VIC AB 2-0 CT1 TAPERPNT 27 (SUTURE) ×1 IMPLANT
SUT VIC AB 3-0 FS2 27 (SUTURE) ×3 IMPLANT
SUT VLOC 180 0 24IN GS25 (SUTURE) ×3 IMPLANT
SYR 50ML LL SCALE MARK (SYRINGE) ×3 IMPLANT
TOWEL OR 17X24 6PK STRL BLUE (TOWEL DISPOSABLE) ×3 IMPLANT
TOWEL OR 17X26 10 PK STRL BLUE (TOWEL DISPOSABLE) ×3 IMPLANT
TRAY CATH 16FR W/PLASTIC CATH (SET/KITS/TRAYS/PACK) IMPLANT

## 2017-11-08 NOTE — Interval H&P Note (Signed)
History and Physical Interval Note:  11/08/2017 9:08 AM  Todd Romero  has presented today for surgery, with the diagnosis of RIGHT KNEE DEGENERATIVE JOINT DISEASE  The various methods of treatment have been discussed with the patient and family. After consideration of risks, benefits and other options for treatment, the patient has consented to  Procedure(s): TOTAL KNEE ARTHROPLASTY (Right) as a surgical intervention .  The patient's history has been reviewed, patient examined, no change in status, stable for surgery.  I have reviewed the patient's chart and labs.  Questions were answered to the patient's satisfaction.     [name] G

## 2017-11-08 NOTE — Evaluation (Signed)
Physical Therapy Evaluation Patient Details Name: Todd Romero MRN: [id number] DOB: April 19, 1934 Today's Date: 11/08/2017   History of Present Illness  Pt is an 84 y.o. male now s/p elective R TKA on 11/08/17. PMH includes HTN, CAD, CHF, OSA, arthritis.   Clinical Impression  Pt presents with an overall decrease in functional mobility secondary to above. PTA, pt mod indep with SPC; lives with wife available for 24/7 support. Educ on precautions, positioning, and importance of mobility. Today, pt able to amb 200' with RW and supervision for safety. Pt would benefit from continued acute PT services to maximize functional mobility and independence prior to return home.     Follow Up Recommendations Follow surgeon's recommendation for DC plan and follow-up therapies    Equipment Recommendations  Rolling walker with 5" wheels;3in1 (PT)    Recommendations for Other Services       Precautions / Restrictions Precautions Precautions: Knee Precaution Booklet Issued: Yes (comment) Precaution Comments: Verbally reviewed precautions Required Braces or Orthoses: Knee Immobilizer - Right Restrictions Weight Bearing Restrictions: Yes RLE Weight Bearing: Weight bearing as tolerated      Mobility  Bed Mobility Overal bed mobility: Needs Assistance Bed Mobility: Supine to Sit     Supine to sit: Supervision;HOB elevated     General bed mobility comments: Increased time and effort; no physical assist required  Transfers Overall transfer level: Needs assistance Equipment used: Rolling walker (2 wheeled) Transfers: Sit to/from Stand Sit to Stand: Supervision         General transfer comment: Supervision for safety. Educ on correct hand placement. Poor eccentric control into sitting to lower recliner  Ambulation/Gait Ambulation/Gait assistance: Supervision;Min guard Ambulation Distance (Feet): 200 Feet Assistive device: Rolling walker (2 wheeled) Gait Pattern/deviations:  Step-through pattern;Decreased stride length;Decreased weight shift to right;Antalgic;Trunk flexed Gait velocity: Decreased Gait velocity interpretation: <1.8 ft/sec, indicative of risk for recurrent falls General Gait Details: Slow, antalgic amb with RW, progressing to supervision for safety. Pt with decreased heel-to-toe gait pattern (limited by R knee immobilizer), but increased ability to WB as amb distance progressed  Stairs            Wheelchair Mobility    Modified Rankin (Stroke Patients Only)       Balance Overall balance assessment: Needs assistance   Sitting balance-Leahy Scale: Fair       Standing balance-Leahy Scale: Poor Standing balance comment: Reliant on UE support                             Pertinent Vitals/Pain Pain Assessment: 0-10 Pain Score: 5  Pain Location: R knee Pain Descriptors / Indicators: Sore Pain Intervention(s): Monitored during session    Home Living Family/patient expects to be discharged to:: Private residence Living Arrangements: Spouse/significant other Available Help at Discharge: Family;Available 24 hours/day Type of Home: House Home Access: Stairs to enter Entrance Stairs-Rails: Left Entrance Stairs-Number of Steps: 3 Home Layout: Multi-level;Able to live on main level with bedroom/bathroom Home Equipment: Todd Romero - single point      Prior Function Level of Independence: Independent with assistive device(s)         Comments: Has been using cane secondary to R knee pain     Hand Dominance        Extremity/Trunk Assessment   Upper Extremity Assessment Upper Extremity Assessment: Overall WFL for tasks assessed    Lower Extremity Assessment Lower Extremity Assessment: RLE deficits/detail RLE Deficits / Details: R hip flex  3/5 RLE: Unable to fully assess due to immobilization       Communication   Communication: HOH  Cognition Arousal/Alertness: Awake/alert Behavior During Therapy: WFL for  tasks assessed/performed Overall Cognitive Status: Within Functional Limits for tasks assessed                                        General Comments General comments (skin integrity, edema, etc.): Wife present throughout session    Exercises     Assessment/Plan    PT Assessment Patient needs continued PT services  PT Problem List Decreased strength;Decreased range of motion;Decreased activity tolerance;Decreased balance;Decreased mobility;Decreased knowledge of use of DME;Pain       PT Treatment Interventions DME instruction;Gait training;Stair training;Functional mobility training;Therapeutic activities;Therapeutic exercise;Balance training;Patient/family education    PT Goals (Current goals can be found in the Care Plan section)  Acute Rehab PT Goals Patient Stated Goal: Return home PT Goal Formulation: With patient Time For Goal Achievement: 11/22/17 Potential to Achieve Goals: Good    Frequency 7X/week   Barriers to discharge        Co-evaluation               AM-PAC PT "6 Clicks" Daily Activity  Outcome Measure Difficulty turning over in bed (including adjusting bedclothes, sheets and blankets)?: A Little Difficulty moving from lying on back to sitting on the side of the bed? : A Little Difficulty sitting down on and standing up from a chair with arms (e.g., wheelchair, bedside commode, etc,.)?: A Little Help needed moving to and from a bed to chair (including a wheelchair)?: A Little Help needed walking in hospital room?: A Little Help needed climbing 3-5 steps with a railing? : A Little 6 Click Score: 18    End of Session Equipment Utilized During Treatment: Gait belt Activity Tolerance: Patient tolerated treatment well Patient left: in chair;with call bell/phone within reach;with family/visitor present Nurse Communication: Mobility status PT Visit Diagnosis: Other abnormalities of gait and mobility (R26.89);Pain Pain - Right/Left:  Right Pain - part of body: Knee    Time: 9373-4287 PT Time Calculation (min) (ACUTE ONLY): 28 min   Charges:   PT Evaluation $PT Eval Moderate Complexity: 1 Mod PT Treatments $Gait Training: 8-22 mins   PT G Codes:       Todd Romero, PT, DPT Acute Rehab Services  Pager: [place] 11/08/2017, 5:17 PM

## 2017-11-08 NOTE — Transfer of Care (Signed)
Immediate Anesthesia Transfer of Care Note  Patient: Todd Romero  Procedure(s) Performed: TOTAL KNEE ARTHROPLASTY (Right Knee)  Patient Location: PACU  Anesthesia Type:Regional and Spinal  Level of Consciousness: awake, alert  and oriented  Airway & Oxygen Therapy: Patient Spontanous Breathing and Patient connected to nasal cannula oxygen  Post-op Assessment: Report given to RN and Post -op Vital signs reviewed and stable  Post vital signs: Reviewed and stable  Last Vitals:  Vitals:   11/08/17 0910 11/08/17 0911  BP: (!) 123/46 (!) 123/46  Pulse: (!) 53 (!) 52  Resp: 14 16  Temp:    SpO2: 98% 98%    Last Pain:  Vitals:   11/08/17 0826  PainSc: 1       Patients Stated Pain Goal: 4 (03/52/48 1859)  Complications: No apparent anesthesia complications

## 2017-11-08 NOTE — Anesthesia Procedure Notes (Signed)
Spinal  Patient location during procedure: OR Start time: 11/08/2017 10:06 AM End time: 11/08/2017 10:10 AM Staffing Anesthesiologist: Lillia Abed, MD Performed: anesthesiologist  Preanesthetic Checklist Completed: patient identified, surgical consent, pre-op evaluation, timeout performed, IV checked, risks and benefits discussed and monitors and equipment checked Spinal Block Patient position: sitting Prep: DuraPrep Patient monitoring: heart rate, cardiac monitor, continuous pulse ox and blood pressure Approach: right paramedian Location: L3-4 Injection technique: single-shot Needle Needle type: Pencan  Needle gauge: 24 G Needle length: 9 cm Needle insertion depth: 7 cm

## 2017-11-08 NOTE — Anesthesia Procedure Notes (Signed)
Procedure Name: MAC Date/Time: 11/08/2017 10:20 AM Performed by: Orlie Dakin, CRNA Pre-anesthesia Checklist: Patient identified, Emergency Drugs available, Suction available, Patient being monitored and Timeout performed Patient Re-evaluated:Patient Re-evaluated prior to induction Oxygen Delivery Method: Simple face mask

## 2017-11-08 NOTE — Anesthesia Preprocedure Evaluation (Signed)
Anesthesia Evaluation  Patient identified by MRN, date of birth, ID band Patient awake    Reviewed: Allergy & Precautions, NPO status , Patient's Chart, lab work & pertinent test results  Airway Mallampati: II  TM Distance: >3 FB Neck ROM: Full    Dental   Pulmonary sleep apnea ,    Pulmonary exam normal        Cardiovascular hypertension, + CAD  Normal cardiovascular exam+ Valvular Problems/Murmurs AS   S/P TAVR 03/2017   Neuro/Psych    GI/Hepatic   Endo/Other  diabetes, Type 2, Insulin Dependent, Oral Hypoglycemic Agents  Renal/GU      Musculoskeletal   Abdominal   Peds  Hematology   Anesthesia Other Findings   Reproductive/Obstetrics                             Anesthesia Physical Anesthesia Plan  ASA: III  Anesthesia Plan: Spinal   Post-op Pain Management:  Regional for Post-op pain   Induction:   PONV Risk Score and Plan: 1 and Ondansetron and Treatment may vary due to age or medical condition  Airway Management Planned: Simple Face Mask  Additional Equipment:   Intra-op Plan:   Post-operative Plan:   Informed Consent: I have reviewed the patients History and Physical, chart, labs and discussed the procedure including the risks, benefits and alternatives for the proposed anesthesia with the patient or authorized representative who has indicated his/her understanding and acceptance.     Plan Discussed with: CRNA and Surgeon  Anesthesia Plan Comments:         Anesthesia Quick Evaluation

## 2017-11-08 NOTE — Interval H&P Note (Signed)
History and Physical Interval Note:  11/08/2017 9:07 AM  Todd Romero Age  has presented today for surgery, with the diagnosis of RIGHT KNEE DEGENERATIVE JOINT DISEASE  The various methods of treatment have been discussed with the patient and family. After consideration of risks, benefits and other options for treatment, the patient has consented to  Procedure(s): TOTAL KNEE ARTHROPLASTY (Right) as a surgical intervention .  The patient's history has been reviewed, patient examined, no change in status, stable for surgery.  I have reviewed the patient's chart and labs.  Questions were answered to the patient's satisfaction.     [name] G

## 2017-11-08 NOTE — Op Note (Signed)
PREOP DIAGNOSIS: DJD RIGHT KNEE POSTOP DIAGNOSIS: same PROCEDURE: RIGHT TKR ANESTHESIA: Spinal and MAC ATTENDING SURGEON: Tyeesha Riker G ASSISTANT: Loni Dolly PA  INDICATIONS FOR PROCEDURE: Todd Romero is a 84 y.o. male who has struggled for a long time with pain due to degenerative arthritis of the right knee.  The patient has failed many conservative non-operative measures and at this point has pain which limits the ability to sleep and walk.  The patient is offered total knee replacement.  Informed operative consent was obtained after discussion of possible risks of anesthesia, infection, neurovascular injury, DVT, and death.  The importance of the post-operative rehabilitation protocol to optimize result was stressed extensively with the patient.  SUMMARY OF FINDINGS AND PROCEDURE:  Todd Romero was taken to the operative suite where under the above anesthesia a right knee replacement was performed.  There were advanced degenerative changes and the bone quality was excellent.  We used the DePuy LCS system and placed size standard plus femur, 5 tibia, 38 mm all polyethylene patella, and a size 12.5 mm spacer.  Loni Dolly PA-C assisted throughout and was invaluable to the completion of the case in that he helped retract and maintain exposure while I placed components.  He also helped close thereby minimizing OR time.  The patient was admitted for appropriate post-op care to include perioperative antibiotics and mechanical and pharmacologic measures for DVT prophylaxis.  DESCRIPTION OF PROCEDURE:  Todd Romero was taken to the operative suite where the above anesthesia was applied.  The patient was positioned supine and prepped and draped in normal sterile fashion.  An appropriate time out was performed.  After the administration of kefzol pre-op antibiotic the leg was elevated and exsanguinated and a tourniquet inflated. A standard longitudinal incision was made on the  anterior knee.  Dissection was carried down to the extensor mechanism.  All appropriate anti-infective measures were used including the pre-operative antibiotic, betadine impregnated drape, and closed hooded exhaust systems for each member of the surgical team.  A medial parapatellar incision was made in the extensor mechanism and the knee cap flipped and the knee flexed.  Some residual meniscal tissues were removed along with any remaining ACL/PCL tissue.  A guide was placed on the tibia and a flat cut was made on it's superior surface.  An intramedullary guide was placed in the femur and was utilized to make anterior and posterior cuts creating an appropriate flexion gap.  A second intramedullary guide was placed in the femur to make a distal cut properly balancing the knee with an extension gap equal to the flexion gap.  The three bones sized to the above mentioned sizes and the appropriate guides were placed and utilized.  A trial reduction was done and the knee easily came to full extension and the patella tracked well on flexion.  The trial components were removed and all bones were cleaned with pulsatile lavage and then dried thoroughly.  Cement was mixed and was pressurized onto the bones followed by placement of the aforementioned components.  Excess cement was trimmed and pressure was held on the components until the cement had hardened.  The tourniquet was deflated and a small amount of bleeding was controlled with cautery and pressure.  The knee was irrigated thoroughly.  The extensor mechanism was re-approximated with V-loc suture in running fashion.  The knee was flexed and the repair was solid.  The subcutaneous tissues were re-approximated with #0 and #2-0 vicryl and the skin closed with  a subcuticular stitch and steristrips.  A sterile dressing was applied.  Intraoperative fluids, EBL, and tourniquet time can be obtained from anesthesia records.  DISPOSITION:  The patient was taken to recovery  room in stable condition and admitted for appropriate post-op care to include peri-operative antibiotic and DVT prophylaxis with mechanical and pharmacologic measures.  Noraa Pickeral G 11/08/2017, 11:32 AM

## 2017-11-08 NOTE — Anesthesia Postprocedure Evaluation (Signed)
Anesthesia Post Note  Patient: Todd Romero  Procedure(s) Performed: TOTAL KNEE ARTHROPLASTY (Right Knee)     Patient location during evaluation: PACU Anesthesia Type: Regional Level of consciousness: oriented and awake and alert Pain management: pain level controlled Vital Signs Assessment: post-procedure vital signs reviewed and stable Respiratory status: spontaneous breathing, respiratory function stable and patient connected to nasal cannula oxygen Cardiovascular status: blood pressure returned to baseline and stable Postop Assessment: no headache, no backache and no apparent nausea or vomiting Anesthetic complications: no Comments: Pt has some mild L hand numbness in Median nerve distribution. Seems to be improving. ? BP cuff.  Will follow.    Last Vitals:  Vitals:   11/08/17 1239 11/08/17 1254  BP: 99/76 115/61  Pulse: (!) 52 (!) 51  Resp: 14 13  Temp:    SpO2: 92% 97%    Last Pain:  Vitals:   11/08/17 1209  PainSc: 0-No pain                 Elizabethann Lackey DAVID

## 2017-11-08 NOTE — Progress Notes (Signed)
Pt complaining of left hand numbmess in fingers, Dr Conrad Prospect made aware and BP cuff changed to other arm,

## 2017-11-08 NOTE — Progress Notes (Signed)
Pt admitted to the unit from pacu; pt A&O x4 with spouse at bedside; RLE knee has ace wrap dsg clean, dry and intact with knee immobilizer on. Neuro check wnl; good sensation to RLE; continuous pulse on; pt due to void since 12 noon per report; pt oriented to the unit and room; fall/safety precaution prevention education completed. VSS; Pt up in bed with call light within reach and family at bedside. Will continue to closely monitor. Delia Heady RN

## 2017-11-09 ENCOUNTER — Encounter (HOSPITAL_COMMUNITY): Payer: Self-pay | Admitting: *Deleted

## 2017-11-09 LAB — GLUCOSE, CAPILLARY
GLUCOSE-CAPILLARY: 123 mg/dL — AB (ref 65–99)
Glucose-Capillary: 174 mg/dL — ABNORMAL HIGH (ref 65–99)
Glucose-Capillary: 243 mg/dL — ABNORMAL HIGH (ref 65–99)
Glucose-Capillary: 167 mg/dL — ABNORMAL HIGH (ref 65–99)

## 2017-11-09 NOTE — Progress Notes (Signed)
Physical Therapy Treatment Patient Details Name: Todd Romero MRN: [id number] DOB: [birth year] Today's Date: 11/09/2017    History of Present Illness Pt is an 84 y.o. male now s/p elective R TKA on 11/08/17. PMH includes HTN, CAD, CHF, OSA, arthritis.     PT Comments    Patient tolerated increased gait distance and is progressing toward step through gait pattern. Pt needs to practice stairs next session.    Follow Up Recommendations  Follow surgeon's recommendation for DC plan and follow-up therapies     Equipment Recommendations  Rolling walker with 5" wheels;3in1 (PT)    Recommendations for Other Services       Precautions / Restrictions Precautions Precautions: Knee Precaution Comments: knee precautions reviewed with pt  Required Braces or Orthoses: Knee Immobilizer - Right Restrictions Weight Bearing Restrictions: Yes RLE Weight Bearing: Weight bearing as tolerated    Mobility  Bed Mobility Overal bed mobility: Needs Assistance Bed Mobility: Sit to Supine       Sit to supine: Min assist   General bed mobility comments: assist required to bring R LE into bed; cues for technique  Transfers Overall transfer level: Needs assistance Equipment used: Rolling walker (2 wheeled) Transfers: Sit to/from Stand Sit to Stand: Min assist         General transfer comment: assist to power up into standing; cues for safe hand placement  Ambulation/Gait Ambulation/Gait assistance: Min guard Ambulation Distance (Feet): (38ft X2) Assistive device: Rolling walker (2 wheeled) Gait Pattern/deviations: Decreased weight shift to right;Antalgic;Trunk flexed;Decreased stance time - right;Decreased step length - left;Step-to pattern;Step-through pattern Gait velocity: Decreased   General Gait Details: cues for sequencing of gait with use of AD for smoother gait patter, posture, and step length symmetry; pt with improving step through pattern   Stairs             Wheelchair Mobility    Modified Rankin (Stroke Patients Only)       Balance Overall balance assessment: Needs assistance   Sitting balance-Leahy Scale: Fair       Standing balance-Leahy Scale: Poor Standing balance comment: Reliant on UE support                            Cognition Arousal/Alertness: Awake/alert Behavior During Therapy: WFL for tasks assessed/performed Overall Cognitive Status: Within Functional Limits for tasks assessed                                        Exercises      General Comments General comments (skin integrity, edema, etc.): wife present throughout session; pt and encouraged to work on HEP exercises this evening      Pertinent Vitals/Pain Pain Assessment: 0-10 Pain Score: 4  Pain Location: R knee Pain Descriptors / Indicators: Sore;Guarding Pain Intervention(s): Limited activity within patient's tolerance;Monitored during session;Premedicated before session;Repositioned    Home Living                      Prior Function            PT Goals (current goals can now be found in the care plan section) Acute Rehab PT Goals Patient Stated Goal: Return home PT Goal Formulation: With patient Time For Goal Achievement: 11/22/17 Potential to Achieve Goals: Good Progress towards PT goals: Progressing toward goals    Frequency  7X/week      PT Plan Current plan remains appropriate    Co-evaluation              AM-PAC PT "6 Clicks" Daily Activity  Outcome Measure  Difficulty turning over in bed (including adjusting bedclothes, sheets and blankets)?: A Little Difficulty moving from lying on back to sitting on the side of the bed? : A Little Difficulty sitting down on and standing up from a chair with arms (e.g., wheelchair, bedside commode, etc,.)?: Unable Help needed moving to and from a bed to chair (including a wheelchair)?: A Little Help needed walking in hospital room?: A  Little Help needed climbing 3-5 steps with a railing? : A Little 6 Click Score: 16    End of Session Equipment Utilized During Treatment: Gait belt Activity Tolerance: Patient tolerated treatment well Patient left: with call bell/phone within reach;in bed;with family/visitor present;with SCD's reapplied Nurse Communication: Mobility status PT Visit Diagnosis: Other abnormalities of gait and mobility (R26.89);Pain Pain - Right/Left: Right Pain - part of body: Knee     Time: 4540-9811 PT Time Calculation (min) (ACUTE ONLY): 33 min  Charges:  $Gait Training: 8-22 mins $Therapeutic Activity: 8-22 mins                    G Codes:       Earney Navy, PTA Pager: [phone]     Darliss Cheney 11/09/2017, 4:45 PM

## 2017-11-09 NOTE — Progress Notes (Signed)
Subjective: 1 Day Post-Op Procedure(s) (LRB): TOTAL KNEE ARTHROPLASTY (Right)   Patiebnt feels great and has no complaints.  Activity level:  wbat Diet tolerance:  ok Voiding:  ok Patient reports pain as mild.    Objective: Vital signs in last 24 hours: Temp:  [97.3 F (36.3 C)-98.7 F (37.1 C)] 98.6 F (37 C) (03/06 0643) Pulse Rate:  [51-75] 62 (03/06 0643) Resp:  [10-20] 16 (03/06 0643) BP: (99-195)/(33-83) 123/55 (03/06 0643) SpO2:  [92 %-100 %] 95 % (03/06 0643) Weight:  [106.6 kg (235 lb)] 106.6 kg (235 lb) (03/05 0829)  Labs: No results for input(s): HGB in the last 72 hours. No results for input(s): WBC, RBC, HCT, PLT in the last 72 hours. No results for input(s): NA, K, CL, CO2, BUN, CREATININE, GLUCOSE, CALCIUM in the last 72 hours. No results for input(s): LABPT, INR in the last 72 hours.  Physical Exam:  Neurologically intact ABD soft Neurovascular intact Sensation intact distally Intact pulses distally Dorsiflexion/Plantar flexion intact Incision: dressing C/D/I and no drainage No cellulitis present Compartment soft  Assessment/Plan:  1 Day Post-Op Procedure(s) (LRB): TOTAL KNEE ARTHROPLASTY (Right) Advance diet Up with therapy D/C IV fluids Plan for discharge tomorrow Discharge home with home health if doing well and cleared by PT. Continue on 325mg  ASA BID x 2 weeks post op. Follow up in office 2 weeks post op.  Todd Romero, Larwance Sachs 11/09/2017, 7:48 AM

## 2017-11-09 NOTE — Anesthesia Procedure Notes (Signed)
Anesthesia Regional Block: Adductor canal block   Pre-Anesthetic Checklist: ,, timeout performed, Correct Patient, Correct Site, Correct Laterality, Correct Procedure, Correct Position, site marked, Risks and benefits discussed,  Surgical consent,  Pre-op evaluation,  At surgeon's request and post-op pain management  Laterality: Right  Prep: chloraprep       Needles:  Injection technique: Single-shot  Needle Type: Echogenic Stimulator Needle     Needle Length: 9cm  Needle Gauge: 21     Additional Needles:   Narrative:  Start time: 11/08/2017 7:50 AM End time: 11/08/2017 8:00 AM Injection made incrementally with aspirations every 5 mL.  Performed by: Personally  Anesthesiologist: Lillia Abed, MD  Additional Notes: Monitors applied. Patient sedated. Sterile prep and drape,hand hygiene and sterile gloves were used. Relevant anatomy identified.Needle position confirmed.Local anesthetic injected incrementally after negative aspiration. Local anesthetic spread visualized around nerve(s). Vascular puncture avoided. No complications. Image printed for medical record.The patient tolerated the procedure well.    Lillia Abed MD

## 2017-11-09 NOTE — Progress Notes (Signed)
Physical Therapy Treatment Patient Details Name: Todd Romero MRN: [id number] DOB: 29-May-1934 Today's Date: 11/09/2017    History of Present Illness Pt is an 84 y.o. male now s/p elective R TKA on 11/08/17. PMH includes HTN, CAD, CHF, OSA, arthritis.     PT Comments    Patient tolerated short distance gait this session with min guard assist and RW. Pt is able to perform SLR with < 10 degrees lag. Pt limited by c/o lightheadedness with ambulation and required seated rest break.  Continue to progress as tolerated.   Follow Up Recommendations  Follow surgeon's recommendation for DC plan and follow-up therapies     Equipment Recommendations  Rolling walker with 5" wheels;3in1 (PT)    Recommendations for Other Services       Precautions / Restrictions Precautions Precautions: Knee Precaution Comments: knee precautions reviewed with pt  Required Braces or Orthoses: Knee Immobilizer - Right Restrictions Weight Bearing Restrictions: Yes RLE Weight Bearing: Weight bearing as tolerated    Mobility  Bed Mobility Overal bed mobility: Needs Assistance Bed Mobility: Supine to Sit     Supine to sit: Supervision;HOB elevated     General bed mobility comments: supervision for safety; HOB elevated slightly; no use of rail  Transfers Overall transfer level: Needs assistance Equipment used: Rolling walker (2 wheeled) Transfers: Sit to/from Stand Sit to Stand: Supervision         General transfer comment: cues for safe hand placement and technique  Ambulation/Gait Ambulation/Gait assistance: Min guard Ambulation Distance (Feet): (32ft then 69ft ) Assistive device: Rolling walker (2 wheeled) Gait Pattern/deviations: Decreased weight shift to right;Antalgic;Trunk flexed;Step-to pattern;Decreased stance time - right;Decreased step length - left Gait velocity: Decreased   General Gait Details: cues for posture, sequencing, and safe proximity to RW; pt unsteady at times but no  LOB   Science writer    Modified Rankin (Stroke Patients Only)       Balance Overall balance assessment: Needs assistance   Sitting balance-Leahy Scale: Fair       Standing balance-Leahy Scale: Poor Standing balance comment: Reliant on UE support                            Cognition Arousal/Alertness: Awake/alert Behavior During Therapy: WFL for tasks assessed/performed Overall Cognitive Status: Within Functional Limits for tasks assessed                                        Exercises Total Joint Exercises Heel Slides: AAROM;Right;5 reps Straight Leg Raises: AROM;AAROM;Right;5 reps    General Comments        Pertinent Vitals/Pain Pain Assessment: Faces Faces Pain Scale: Hurts little more Pain Location: R knee Pain Descriptors / Indicators: Sore;Grimacing;Guarding Pain Intervention(s): Limited activity within patient's tolerance;Monitored during session;Repositioned;RN gave pain meds during session    Home Living                      Prior Function            PT Goals (current goals can now be found in the care plan section) Acute Rehab PT Goals Patient Stated Goal: Return home PT Goal Formulation: With patient Time For Goal Achievement: 11/22/17 Potential to Achieve Goals: Good Progress towards PT goals: Progressing toward goals  Frequency    7X/week      PT Plan Current plan remains appropriate    Co-evaluation              AM-PAC PT "6 Clicks" Daily Activity  Outcome Measure  Difficulty turning over in bed (including adjusting bedclothes, sheets and blankets)?: A Little Difficulty moving from lying on back to sitting on the side of the bed? : A Little Difficulty sitting down on and standing up from a chair with arms (e.g., wheelchair, bedside commode, etc,.)?: A Little Help needed moving to and from a bed to chair (including a wheelchair)?: A Little Help needed  walking in hospital room?: A Little Help needed climbing 3-5 steps with a railing? : A Little 6 Click Score: 18    End of Session Equipment Utilized During Treatment: Gait belt Activity Tolerance: Patient tolerated treatment well Patient left: in chair;with call bell/phone within reach Nurse Communication: Mobility status PT Visit Diagnosis: Other abnormalities of gait and mobility (R26.89);Pain Pain - Right/Left: Right Pain - part of body: Knee     Time: 1610-9604 PT Time Calculation (min) (ACUTE ONLY): 40 min  Charges:  $Gait Training: 23-37 mins $Therapeutic Activity: 8-22 mins                    G Codes:       Earney Navy, PTA Pager: [phone]     Darliss Cheney 11/09/2017, 11:27 AM

## 2017-11-09 NOTE — Addendum Note (Signed)
Addendum  created 11/09/17 1612 by Lillia Abed, MD   Child order released for a procedure order, Intraprocedure Blocks edited, Order Canceled from Note, Sign clinical note

## 2017-11-10 LAB — GLUCOSE, CAPILLARY
GLUCOSE-CAPILLARY: 143 mg/dL — AB (ref 65–99)
GLUCOSE-CAPILLARY: 197 mg/dL — AB (ref 65–99)
GLUCOSE-CAPILLARY: 181 mg/dL — AB (ref 65–99)
GLUCOSE-CAPILLARY: 133 mg/dL — AB (ref 65–99)

## 2017-11-10 NOTE — Progress Notes (Signed)
Subjective: 2 Days Post-Op Procedure(s) (LRB): TOTAL KNEE ARTHROPLASTY (Right)  Patient is feeling a little better. He has not done stairs with PT yet. He is making progress but would probably benefit from one more day in the hospital.  Activity level:  wbat Diet tolerance:  ok Voiding:  ok Patient reports pain as mild.    Objective: Vital signs in last 24 hours: Temp:  [98.2 F (36.8 C)-98.7 F (37.1 C)] 98.2 F (36.8 C) (03/07 0500) Pulse Rate:  [61-64] 61 (03/07 0500) Resp:  [15-16] 16 (03/07 0500) BP: (133-143)/(51-64) 133/51 (03/07 0500) SpO2:  [96 %-98 %] 96 % (03/07 0500)  Labs: No results for input(s): HGB in the last 72 hours. No results for input(s): WBC, RBC, HCT, PLT in the last 72 hours. No results for input(s): NA, K, CL, CO2, BUN, CREATININE, GLUCOSE, CALCIUM in the last 72 hours. No results for input(s): LABPT, INR in the last 72 hours.  Physical Exam:  Neurologically intact ABD soft Neurovascular intact Sensation intact distally Intact pulses distally Dorsiflexion/Plantar flexion intact Incision: dressing C/D/I and no drainage No cellulitis present Compartment soft  Assessment/Plan:  2 Days Post-Op Procedure(s) (LRB): TOTAL KNEE ARTHROPLASTY (Right) Advance diet Up with therapy Plan for discharge tomorrow Discharge home with home health if doing well and cleared by PT. Contonue on ASA 325mg  BID x 2 weeks post op. Follow up in office 2 weeks post op  Todd Romero, Larwance Sachs 11/10/2017, 1:22 PM

## 2017-11-10 NOTE — Care Management Note (Signed)
Case Management Note  Patient Details  Name: Todd Romero MRN: [id number] Date of Birth: 21-Aug-1934  Subjective/Objective:   84 yr old gentleman s/p right total knee arthroplasty.                 Action/Plan: Patient was preoperatively setup with [facility]. No Changes. Has DME. Will have support at discharge.  Expected Discharge Date:                  Expected Discharge Plan:  [place]  In-House Referral:  NA  Discharge planning Services  CM Consult  Post Acute Care Choice:  Durable Medical Equipment, Home Health Choice offered to:  Patient  DME Arranged:  Walker rolling DME Agency:  [employer]  HH Arranged:  PT [place]:  [place]  Status of Service:  Completed, signed off  If discussed at [employer] of Stay Meetings, dates discussed:    Additional Comments:  Ninfa Meeker, RN 11/10/2017, 3:19 PM

## 2017-11-10 NOTE — Progress Notes (Signed)
Physical Therapy Treatment Patient Details Name: Todd Romero MRN: [id number] DOB: 02/24/1934 Today's Date: 11/10/2017    History of Present Illness Pt is an 84 y.o. male now s/p elective R TKA on 11/08/17. PMH includes HTN, CAD, CHF, OSA, arthritis.     PT Comments    Pt seen for mobility progression. Pt requesting to delay stair training for next session later this PM. Pt would continue to benefit from skilled physical therapy services at this time while admitted and after d/c to address the below listed limitations in order to improve overall safety and independence with functional mobility.    Follow Up Recommendations  Home health PT;Supervision/Assistance - 24 hour     Equipment Recommendations  Rolling walker with 5" wheels;3in1 (PT)    Recommendations for Other Services       Precautions / Restrictions Precautions Precautions: Knee Precaution Comments: knee precautions reviewed with pt  Restrictions Weight Bearing Restrictions: Yes RLE Weight Bearing: Weight bearing as tolerated    Mobility  Bed Mobility Overal bed mobility: Needs Assistance Bed Mobility: Supine to Sit     Supine to sit: Min assist     General bed mobility comments: min A with R LE movement and for trunk elevation  Transfers Overall transfer level: Needs assistance Equipment used: Rolling walker (2 wheeled) Transfers: Sit to/from Stand Sit to Stand: From elevated surface;Min assist         General transfer comment: increased time and effort, multiple attempts needed, assist to power into standing from EOB  Ambulation/Gait Ambulation/Gait assistance: Min guard Ambulation Distance (Feet): 40 Feet Assistive device: Rolling walker (2 wheeled) Gait Pattern/deviations: Decreased weight shift to right;Antalgic;Trunk flexed;Decreased stance time - right;Decreased step length - left;Step-to pattern Gait velocity: Decreased Gait velocity interpretation: Below normal speed for  age/gender General Gait Details: pt with slow, antalgic gait with use of step-to pattern. pt limited in distance secondary to fatigue and pain.   Stairs            Wheelchair Mobility    Modified Rankin (Stroke Patients Only)       Balance Overall balance assessment: Needs assistance Sitting-balance support: Feet supported Sitting balance-Leahy Scale: Good     Standing balance support: During functional activity;Bilateral upper extremity supported Standing balance-Leahy Scale: Poor                              Cognition Arousal/Alertness: Awake/alert Behavior During Therapy: WFL for tasks assessed/performed Overall Cognitive Status: Within Functional Limits for tasks assessed                                        Exercises Total Joint Exercises Quad Sets: AROM;Strengthening;Right;10 reps;Seated Hip ABduction/ADduction: AROM;Strengthening;Right;10 reps;Seated Long Arc Quad: AROM;Strengthening;Right;10 reps;Seated Knee Flexion: AROM;Strengthening;Right;10 reps;Seated Goniometric ROM: R knee flexion = ~75 degrees; R knee extension = lacking ~10 degrees to neutral; measured in sitting    General Comments        Pertinent Vitals/Pain Pain Assessment: Faces Faces Pain Scale: Hurts little more Pain Location: R knee Pain Descriptors / Indicators: Sore;Guarding Pain Intervention(s): Monitored during session;Repositioned    Home Living                      Prior Function            PT Goals (current goals can  now be found in the care plan section) Acute Rehab PT Goals PT Goal Formulation: With patient Time For Goal Achievement: 11/22/17 Potential to Achieve Goals: Good Progress towards PT goals: Progressing toward goals    Frequency    7X/week      PT Plan Current plan remains appropriate    Co-evaluation              AM-PAC PT "6 Clicks" Daily Activity  Outcome Measure  Difficulty turning over in bed  (including adjusting bedclothes, sheets and blankets)?: A Little Difficulty moving from lying on back to sitting on the side of the bed? : Unable Difficulty sitting down on and standing up from a chair with arms (e.g., wheelchair, bedside commode, etc,.)?: Unable Help needed moving to and from a bed to chair (including a wheelchair)?: A Little Help needed walking in hospital room?: A Little Help needed climbing 3-5 steps with a railing? : A Little 6 Click Score: 14    End of Session Equipment Utilized During Treatment: Gait belt Activity Tolerance: Patient tolerated treatment well Patient left: in chair;with call bell/phone within reach Nurse Communication: Mobility status PT Visit Diagnosis: Other abnormalities of gait and mobility (R26.89);Pain Pain - Right/Left: Right Pain - part of body: Knee     Time: 9038-3338 PT Time Calculation (min) (ACUTE ONLY): 21 min  Charges:  $Gait Training: 8-22 mins                    G Codes:       [place], [place], [place] 11/10/2017, 9:12 AM

## 2017-11-10 NOTE — Progress Notes (Signed)
Physical Therapy Treatment Patient Details Name: Todd Romero MRN: [id number] DOB: 07/11/1934 Today's Date: 11/10/2017    History of Present Illness Pt is an 84 y.o. male now s/p elective R TKA on 11/08/17. PMH includes HTN, CAD, CHF, OSA, arthritis.     PT Comments    Pt making fair progress with functional mobility. He participated in stair training this session and required mod A to ascend and descend three steps. Pt's wife was present throughout session as well. Pt would continue to benefit from skilled physical therapy services at this time while admitted and after d/c to address the below listed limitations in order to improve overall safety and independence with functional mobility.    Follow Up Recommendations  Home health PT;Supervision/Assistance - 24 hour     Equipment Recommendations  Rolling walker with 5" wheels;3in1 (PT)    Recommendations for Other Services       Precautions / Restrictions Precautions Precautions: Knee Precaution Comments: knee precautions reviewed with pt  Restrictions Weight Bearing Restrictions: Yes RLE Weight Bearing: Weight bearing as tolerated    Mobility  Bed Mobility Overal bed mobility: Needs Assistance Bed Mobility: Sit to Supine       Sit to supine: Min guard   General bed mobility comments: increased time and effort, cueing for techinque, pt using L LE to assist with returning R LE onto bed  Transfers Overall transfer level: Needs assistance Equipment used: Rolling walker (2 wheeled) Transfers: Sit to/from Stand Sit to Stand: Min guard         General transfer comment: increased time and effort, good technique, min guard for safety  Ambulation/Gait Ambulation/Gait assistance: Min guard Ambulation Distance (Feet): 20 Feet(20' x2 with stair training in between) Assistive device: Rolling walker (2 wheeled) Gait Pattern/deviations: Decreased weight shift to right;Antalgic;Trunk flexed;Decreased stance time -  right;Decreased step length - left;Step-to pattern Gait velocity: Decreased Gait velocity interpretation: Below normal speed for age/gender General Gait Details: pt with slow, antalgic gait with use of step-to pattern. pt limited in distance secondary to fatigue and pain.   Stairs Stairs: Yes   Stair Management: One rail Left;Step to pattern;Forwards Number of Stairs: 3 General stair comments: pt requiring mod A on contralateral side to railing; cueing for technique  Wheelchair Mobility    Modified Rankin (Stroke Patients Only)       Balance Overall balance assessment: Needs assistance Sitting-balance support: Feet supported Sitting balance-Leahy Scale: Good     Standing balance support: During functional activity;Bilateral upper extremity supported Standing balance-Leahy Scale: Poor Standing balance comment: Reliant on UE support                            Cognition Arousal/Alertness: Awake/alert Behavior During Therapy: WFL for tasks assessed/performed Overall Cognitive Status: Within Functional Limits for tasks assessed                                        Exercises Total Joint Exercises Ankle Circles/Pumps: AROM;Both;10 reps;Seated Heel Slides: AAROM;Right;10 reps;Seated Hip ABduction/ADduction: AROM;Strengthening;Right;10 reps;Seated Straight Leg Raises: AROM;Right;10 reps;Seated Long Arc Quad: AROM;Strengthening;Right;10 reps;Seated Knee Flexion: AROM;Strengthening;Right;10 reps;Seated    General Comments        Pertinent Vitals/Pain Pain Assessment: Faces Faces Pain Scale: Hurts little more Pain Location: R knee Pain Descriptors / Indicators: Sore;Guarding Pain Intervention(s): Monitored during session;Repositioned    Home Living  Prior Function            PT Goals (current goals can now be found in the care plan section) Acute Rehab PT Goals PT Goal Formulation: With patient Time For  Goal Achievement: 11/22/17 Potential to Achieve Goals: Good Progress towards PT goals: Progressing toward goals    Frequency    7X/week      PT Plan Current plan remains appropriate    Co-evaluation              AM-PAC PT "6 Clicks" Daily Activity  Outcome Measure  Difficulty turning over in bed (including adjusting bedclothes, sheets and blankets)?: None Difficulty moving from lying on back to sitting on the side of the bed? : Unable Difficulty sitting down on and standing up from a chair with arms (e.g., wheelchair, bedside commode, etc,.)?: Unable Help needed moving to and from a bed to chair (including a wheelchair)?: A Little Help needed walking in hospital room?: A Little Help needed climbing 3-5 steps with a railing? : A Lot 6 Click Score: 14    End of Session Equipment Utilized During Treatment: Gait belt Activity Tolerance: Patient tolerated treatment well Patient left: in bed;with call bell/phone within reach;with family/visitor present Nurse Communication: Mobility status PT Visit Diagnosis: Other abnormalities of gait and mobility (R26.89);Pain Pain - Right/Left: Right Pain - part of body: Knee     Time: 9379-0240 PT Time Calculation (min) (ACUTE ONLY): 28 min  Charges:  $Gait Training: 8-22 mins $Therapeutic Exercise: 8-22 mins                    G Codes:       [place], [place], [place] 11/10/2017, 5:26 PM

## 2017-11-11 LAB — URINALYSIS, ROUTINE W REFLEX MICROSCOPIC
BILIRUBIN URINE: NEGATIVE
Glucose, UA: NEGATIVE mg/dL
Hgb urine dipstick: NEGATIVE
Ketones, ur: NEGATIVE mg/dL
LEUKOCYTES UA: NEGATIVE
NITRITE: NEGATIVE
PROTEIN: 30 mg/dL — AB
SQUAMOUS EPITHELIAL / LPF: NONE SEEN
Specific Gravity, Urine: 1.019 (ref 1.005–1.030)
pH: 5 (ref 5.0–8.0)

## 2017-11-11 LAB — GLUCOSE, CAPILLARY
GLUCOSE-CAPILLARY: 190 mg/dL — AB (ref 65–99)
Glucose-Capillary: 63 mg/dL — ABNORMAL LOW (ref 65–99)
Glucose-Capillary: 94 mg/dL (ref 65–99)

## 2017-11-11 MED ORDER — HYDROCODONE-ACETAMINOPHEN 7.5-325 MG PO TABS: 1.0000 | ORAL_TABLET | ORAL | 0 refills | Status: DC | PRN

## 2017-11-11 MED ORDER — BISACODYL 10 MG RE SUPP
10.0000 mg | Freq: Once | RECTAL | Status: DC
  Filled 2017-11-11: qty 1

## 2017-11-11 MED ORDER — DOCUSATE SODIUM 100 MG PO CAPS: 100.0000 mg | ORAL_CAPSULE | Freq: Two times a day (BID) | ORAL | 0 refills | Status: DC

## 2017-11-11 MED ORDER — TIZANIDINE HCL 4 MG PO TABS: 4.0000 mg | ORAL_TABLET | Freq: Four times a day (QID) | ORAL | 1 refills | Status: DC | PRN

## 2017-11-11 MED ORDER — BISACODYL 5 MG PO TBEC: 5.0000 mg | DELAYED_RELEASE_TABLET | Freq: Every day | ORAL | 0 refills | Status: DC | PRN

## 2017-11-11 MED ORDER — ASPIRIN 325 MG PO TBEC: 325.0000 mg | DELAYED_RELEASE_TABLET | Freq: Two times a day (BID) | ORAL | 0 refills | Status: DC

## 2017-11-11 NOTE — Progress Notes (Signed)
PT CBG 63 per NT at this time. Snack given as pt is taking POs. Will recheck CBG.

## 2017-11-11 NOTE — Progress Notes (Signed)
Subjective: 3 Days Post-Op Procedure(s) (LRB): TOTAL KNEE ARTHROPLASTY (Right)   Patient doing well and wants to go home today. No BM yet though. He is passing gas. According to nursing his urine appread dark and has an odor. Nursing will send it off for analysis.  Activity level:  wbat Diet tolerance:  ok Voiding:  No BM yet Patient reports pain as mild.    Objective: Vital signs in last 24 hours: Temp:  [98.5 F (36.9 C)-99.7 F (37.6 C)] 98.7 F (37.1 C) (03/08 0447) Pulse Rate:  [62-70] 70 (03/08 0447) Resp:  [18] 18 (03/08 0447) BP: (122-133)/(49-62) 122/60 (03/08 0447) SpO2:  [97 %-99 %] 98 % (03/08 0447)  Labs: No results for input(s): HGB in the last 72 hours. No results for input(s): WBC, RBC, HCT, PLT in the last 72 hours. No results for input(s): NA, K, CL, CO2, BUN, CREATININE, GLUCOSE, CALCIUM in the last 72 hours. No results for input(s): LABPT, INR in the last 72 hours.  Physical Exam:  Neurologically intact ABD soft Neurovascular intact Sensation intact distally Intact pulses distally Dorsiflexion/Plantar flexion intact Incision: dressing C/D/I and no drainage No cellulitis present Compartment soft  Assessment/Plan:  3 Days Post-Op Procedure(s) (LRB): TOTAL KNEE ARTHROPLASTY (Right) Advance diet Up with therapy Discharge home with home health today after PT. I have ordered a Dulcolax suppository to aid in having a BM. Continue on home plavix and increase ASA to 325mg  BID x 2 weeks. We will also send urine off to make sure no UTI as it has an odor according to nurse so we will make sure that there is no UTI prior to discharge. Follow up in office 2 weeks post op.  Qadir Folks, [place] Sachs 11/11/2017, 7:32 AM

## 2017-11-11 NOTE — Progress Notes (Signed)
Physical Therapy Treatment Patient Details Name: Todd Romero MRN: [id number] DOB: [birth year] Today's Date: 11/11/2017    History of Present Illness Pt is an 84 y.o. male now s/p elective R TKA on 11/08/17. PMH includes HTN, CAD, CHF, OSA, arthritis.     PT Comments    Patient is progressing well toward mobility goals. Pt required min guard assist to ascend steps this session. Current plan remains appropriate.   Follow Up Recommendations  Home health PT;Supervision/Assistance - 24 hour     Equipment Recommendations  Rolling walker with 5" wheels;3in1 (PT)    Recommendations for Other Services       Precautions / Restrictions Precautions Precautions: Knee Precaution Booklet Issued: Yes (comment) Precaution Comments: knee precautions reviewed with pt  Required Braces or Orthoses: Knee Immobilizer - Right Restrictions Weight Bearing Restrictions: Yes RLE Weight Bearing: Weight bearing as tolerated    Mobility  Bed Mobility               General bed mobility comments: pt OOB in chair upon arrival  Transfers Overall transfer level: Needs assistance Equipment used: Rolling walker (2 wheeled) Transfers: Sit to/from Stand Sit to Stand: Min assist         General transfer comment: assist to power up into standing; safe hand placement demonstrated  Ambulation/Gait Ambulation/Gait assistance: Supervision Ambulation Distance (Feet): 200 Feet Assistive device: Rolling walker (2 wheeled) Gait Pattern/deviations: Decreased weight shift to right;Antalgic;Step-through pattern;Decreased stride length Gait velocity: Decreased   General Gait Details: cues for posture and forward gaze; pt with improved step through pattern and R knee flexion during swing phase   Stairs     Stair Management: One rail Left;Step to pattern;Forwards Number of Stairs: 3 General stair comments: min guard for safety when ascending steps and min A when descending; cues  for sequencing and  technique  Wheelchair Mobility    Modified Rankin (Stroke Patients Only)       Balance Overall balance assessment: Needs assistance Sitting-balance support: Feet supported Sitting balance-Leahy Scale: Good     Standing balance support: During functional activity;Bilateral upper extremity supported Standing balance-Leahy Scale: Poor Standing balance comment: Reliant on UE support                            Cognition Arousal/Alertness: Awake/alert Behavior During Therapy: WFL for tasks assessed/performed Overall Cognitive Status: Within Functional Limits for tasks assessed                                        Exercises Total Joint Exercises Ankle Circles/Pumps: AROM;Both;10 reps;Seated Heel Slides: Right;Seated;AROM;5 reps Hip ABduction/ADduction: AROM;Strengthening;Right;10 reps;Seated Straight Leg Raises: AROM;Right;Seated;5 reps    General Comments General comments (skin integrity, edema, etc.): wife present throughout session      Pertinent Vitals/Pain Pain Assessment: Faces Faces Pain Scale: Hurts little more Pain Location: R knee Pain Descriptors / Indicators: Sore Pain Intervention(s): Limited activity within patient's tolerance;Monitored during session;Premedicated before session;Repositioned    Home Living                      Prior Function            PT Goals (current goals can now be found in the care plan section) Acute Rehab PT Goals Patient Stated Goal: Return home PT Goal Formulation: With patient Time For Goal Achievement: 11/22/17  Potential to Achieve Goals: Good Progress towards PT goals: Progressing toward goals    Frequency    7X/week      PT Plan Current plan remains appropriate    Co-evaluation              AM-PAC PT "6 Clicks" Daily Activity  Outcome Measure  Difficulty turning over in bed (including adjusting bedclothes, sheets and blankets)?: None Difficulty moving from lying  on back to sitting on the side of the bed? : Unable Difficulty sitting down on and standing up from a chair with arms (e.g., wheelchair, bedside commode, etc,.)?: Unable Help needed moving to and from a bed to chair (including a wheelchair)?: A Little Help needed walking in hospital room?: A Little Help needed climbing 3-5 steps with a railing? : A Little 6 Click Score: 15    End of Session Equipment Utilized During Treatment: Gait belt Activity Tolerance: Patient tolerated treatment well Patient left: with call bell/phone within reach;with family/visitor present;in chair Nurse Communication: Mobility status PT Visit Diagnosis: Other abnormalities of gait and mobility (R26.89);Pain Pain - Right/Left: Right Pain - part of body: Knee     Time: 7939-6886 PT Time Calculation (min) (ACUTE ONLY): 31 min  Charges:  $Gait Training: 23-37 mins                    G Codes:       Earney Navy, PTA Pager: [phone]     Darliss Cheney 11/11/2017, 10:45 AM

## 2017-11-11 NOTE — Care Management Important Message (Signed)
Important Message  Patient Details  Name: Todd Romero MRN: [id number] Date of Birth: Jun 10, 1934   Medicare Important Message Given:  Yes    Orbie Pyo 11/11/2017, 12:51 PM

## 2017-11-11 NOTE — Discharge Summary (Signed)
Patient ID: Todd Romero MRN: [id number] DOB/AGE: [birth year] 82 y.o.  Admit date: 11/08/2017 Discharge date: 11/11/2017  Admission Diagnoses:  Principal Problem:   Primary osteoarthritis of right knee   Discharge Diagnoses:  Same  Past Medical History:  Diagnosis Date  . Anemia   . Arthritis   . CAD (coronary artery disease)    a. cardiac cath 12/2016 showing moderate AS, elevated LVEDP, heavy 3V coronary calcification, 100% mD2, 30-50% LAD, 50-90% stenosis of Cx proximal to origin of L-PDA, RCA not engaged due to poor catheter control (difficult procedure) - coronary status was essentially unchanged from prior.  . Chronic diastolic CHF (congestive heart failure) ([place])   . Degenerative arthritis   . Diabetes ([place])   . Dyspnea   . Essential hypertension   . Hyperlipidemia   . Obesity (BMI 30-39.9) 06/18/2015  . OSA (obstructive sleep apnea)    Severe with AHI 27/hr now on CPAP  no cpap  . Peritonitis ([place]) 1985?  [name] Pneumonia    "6 months - 84 years old"  . Pure hypercholesterolemia   . RBBB   . [facility] spotted fever   . 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 Severe aortic stenosis    a. s/p TAVR 03/2017.  . Type II or unspecified type diabetes mellitus without mention of complication, not stated as uncontrolled     Surgeries: Procedure(s): TOTAL KNEE ARTHROPLASTY on 11/08/2017   Consultants:   Discharged Condition: Improved  Hospital Course: Todd Romero is an 84 y.o. male who was admitted 11/08/2017 for operative treatment ofPrimary osteoarthritis of right knee. Patient has severe unremitting pain that affects sleep, daily activities, and work/hobbies. After pre-op clearance the patient was taken to the operating room on 11/08/2017 and underwent  Procedure(s): TOTAL KNEE ARTHROPLASTY.    Patient was given perioperative antibiotics:   Anti-infectives (From admission, onward)   Start     Dose/Rate Route Frequency Ordered Stop   11/08/17 1530  ceFAZolin (ANCEF) IVPB 2g/100 mL premix     2 g 200 mL/hr over 30 Minutes Intravenous Every 6 hours 11/08/17 1442 11/08/17 2157   11/08/17 0830  ceFAZolin (ANCEF) IVPB 2g/100 mL premix     2 g 200 mL/hr over 30 Minutes Intravenous To [facility] 11/07/17 1252 11/08/17 1032       Patient was given sequential compression devices, early ambulation, and chemoprophylaxis to prevent DVT.  Patient benefited maximally from hospital stay and there were no complications.    Recent vital signs:  Patient Vitals for the past 24 hrs:  BP Temp Temp src Pulse Resp SpO2  11/11/17 0447 122/60 98.7 F (37.1 C) Oral 70 18 98 %  11/10/17 2036 133/62 98.5 F (36.9 C) Oral 62 18 99 %  11/10/17 1554 (!) 127/49 99.7 F (37.6 C) Oral 65 - 97 %     Recent laboratory studies: No results for input(s): WBC, HGB, HCT, PLT, NA, K, CL, CO2, BUN, CREATININE, GLUCOSE, INR, CALCIUM in the last 72 hours.  Invalid input(s): PT, 2   Discharge Medications:   Allergies as of 11/11/2017      Reactions   Morphine And Related Nausea And Vomiting      Medication List    TAKE these medications   ACCU-CHEK AVIVA PLUS test strip Generic drug:  glucose blood USE AS INSTRUCTED TO CHECK BLOOD SUGAR TWICE A DAY   acetaminophen  500 MG tablet Commonly known as:  TYLENOL Take 500 mg by mouth every 4 (four) hours as needed for moderate pain or headache.   amLODipine 2.5 MG tablet Commonly known as:  NORVASC Take 2.5 mg by mouth daily.   aspirin 325 MG EC tablet Take 1 tablet (325 mg total) by mouth 2 (two) times daily after a meal. What changed:    medication strength  how much to take  when to take this   B-D INS SYR ULTRAFINE 1CC/31G 31G X 5/16" 1 ML Misc Generic drug:  Insulin Syringe-Needle U-100 USE TO INJECT INSULIN DAILY   bisacodyl 5 MG EC tablet Commonly known as:  DULCOLAX Take 1  tablet (5 mg total) by mouth daily as needed for moderate constipation.   clopidogrel 75 MG tablet Commonly known as:  PLAVIX Take 1 tablet (75 mg total) by mouth daily with breakfast.   docusate sodium 100 MG capsule Commonly known as:  COLACE Take 1 capsule (100 mg total) by mouth 2 (two) times daily.   ferrous sulfate 325 (65 FE) MG tablet Take 325 mg by mouth daily with breakfast.   fish oil-omega-3 fatty acids 1000 MG capsule Take 1 g by mouth daily at 12 noon.   furosemide 20 MG tablet Commonly known as:  LASIX Take 1 tablet (20 mg total) by mouth daily.   glimepiride 2 MG tablet Commonly known as:  AMARYL Take 0.5 tablets (1 mg total) by mouth daily.   HUMALOG 100 UNIT/ML injection Generic drug:  insulin lispro Inject 4 to 10 units into the skin three (3) times daily with meals as directed.   HYDROcodone-acetaminophen 7.5-325 MG tablet Commonly known as:  NORCO Take 1-2 tablets by mouth every 4 (four) hours as needed for severe pain.   insulin NPH Human 100 UNIT/ML injection Commonly known as:  HUMULIN N,NOVOLIN N Inject 0.26 mLs (26 Units total) into the skin at bedtime. Uses Vial   Insulin Pen Needle 31G X 5 MM Misc Commonly known as:  B-D UF III MINI PEN NEEDLES USE 2 PEN NEEDLE PER DAY WITH HUMALOG AND VICTOZA   liraglutide 18 MG/3ML Sopn Commonly known as:  VICTOZA Inject 0.3 mLs (1.8 mg total) into the skin at bedtime.   metFORMIN 500 MG (MOD) 24 hr tablet Commonly known as:  GLUMETZA Take 1 tablet (500 mg total) by mouth 2 (two) times daily with a meal. (morning & noon)   multivitamin with minerals Tabs tablet Take 1 tablet by mouth daily.   olmesartan 40 MG tablet Commonly known as:  BENICAR Take 1 tablet (40 mg total) by mouth daily.   pravastatin 80 MG tablet Commonly known as:  PRAVACHOL Take 1 tablet (80 mg total) by mouth every evening.   PRESERVISION AREDS 2 PO Take 1 capsule by mouth 2 (two) times daily.   tamsulosin 0.4 MG Caps  capsule Commonly known as:  FLOMAX Take 1 capsule daily   tiZANidine 4 MG tablet Commonly known as:  ZANAFLEX Take 1 tablet (4 mg total) by mouth every 6 (six) hours as needed.   vitamin C 500 MG tablet Commonly known as:  ASCORBIC ACID Take 500 mg by mouth daily.            Durable Medical Equipment  (From admission, onward)        Start     Ordered   11/08/17 1443  DME Walker rolling  Once    Question:  Patient needs a walker to treat with the  following condition  Answer:  Primary osteoarthritis of right knee   11/08/17 1442   11/08/17 1443  DME 3 n 1  Once     11/08/17 1442   11/08/17 1443  DME Bedside commode  Once    Question:  Patient needs a bedside commode to treat with the following condition  Answer:  Primary osteoarthritis of right knee   11/08/17 1442      Diagnostic Studies: Dg Chest 2 View  Result Date: 11/02/2017 CLINICAL DATA:  Preop for total knee arthroplasty.  Hypertension. EXAM: CHEST  2 VIEW COMPARISON:  03/16/2017 FINDINGS: Mild hyperinflation. Midline trachea. Moderate cardiomegaly. Atherosclerosis in the transverse aorta. Aortic valve replacement. No pleural effusion or pneumothorax. No congestive failure. IMPRESSION: No acute cardiopulmonary disease. Cardiomegaly without congestive failure. Aortic Atherosclerosis (ICD10-I70.0). Electronically Signed   By: Abigail Miyamoto M.D.   On: 11/02/2017 08:50    Disposition: 01-Home or Self Care  Discharge Instructions    Call MD / Call 911   Complete by:  As directed    If you experience chest pain or shortness of breath, CALL 911 and be transported to the hospital emergency room.  If you develope a fever above 101 F, pus (white drainage) or increased drainage or redness at the wound, or calf pain, call your surgeon's office.   Constipation Prevention   Complete by:  As directed    Drink plenty of fluids.  Prune juice may be helpful.  You may use a stool softener, such as Colace (over the counter) 100 mg  twice a day.  Use MiraLax (over the counter) for constipation as needed.   Diet - low sodium heart healthy   Complete by:  As directed    Discharge instructions   Complete by:  As directed    INSTRUCTIONS AFTER JOINT REPLACEMENT   Remove items at home which could result in a fall. This includes throw rugs or furniture in walking pathways ICE to the affected joint every three hours while awake for 30 minutes at a time, for at least the first 3-5 days, and then as needed for pain and swelling.  Continue to use ice for pain and swelling. You may notice swelling that will progress down to the foot and ankle.  This is normal after surgery.  Elevate your leg when you are not up walking on it.   Continue to use the breathing machine you got in the hospital (incentive spirometer) which will help keep your temperature down.  It is common for your temperature to cycle up and down following surgery, especially at night when you are not up moving around and exerting yourself.  The breathing machine keeps your lungs expanded and your temperature down.   DIET:  As you were doing prior to hospitalization, we recommend a well-balanced diet.  DRESSING / WOUND CARE / SHOWERING  You may shower 3 days after surgery, but keep the wounds dry during showering.  You may use an occlusive plastic wrap (Press'n Seal for example), NO SOAKING/SUBMERGING IN THE BATHTUB.  If the bandage gets wet, change with a clean dry gauze.  If the incision gets wet, pat the wound dry with a clean towel.  ACTIVITY  Increase activity slowly as tolerated, but follow the weight bearing instructions below.   No driving for 6 weeks or until further direction given by your physician.  You cannot drive while taking narcotics.  No lifting or carrying greater than 10 lbs. until further directed by your surgeon. Avoid  periods of inactivity such as sitting longer than an hour when not asleep. This helps prevent blood clots.  You may return to work  once you are authorized by your doctor.     WEIGHT BEARING   Weight bearing as tolerated with assist device (walker, cane, etc) as directed, use it as long as suggested by your surgeon or therapist, typically at least 4-6 weeks.   EXERCISES  Results after joint replacement surgery are often greatly improved when you follow the exercise, range of motion and muscle strengthening exercises prescribed by your doctor. Safety measures are also important to protect the joint from further injury. Any time any of these exercises cause you to have increased pain or swelling, decrease what you are doing until you are comfortable again and then slowly increase them. If you have problems or questions, call your caregiver or physical therapist for advice.   Rehabilitation is important following a joint replacement. After just a few days of immobilization, the muscles of the leg can become weakened and shrink (atrophy).  These exercises are designed to build up the tone and strength of the thigh and leg muscles and to improve motion. Often times heat used for twenty to thirty minutes before working out will loosen up your tissues and help with improving the range of motion but do not use heat for the first two weeks following surgery (sometimes heat can increase post-operative swelling).   These exercises can be done on a training (exercise) mat, on the floor, on a table or on a bed. Use whatever works the best and is most comfortable for you.    Use music or television while you are exercising so that the exercises are a pleasant break in your day. This will make your life better with the exercises acting as a break in your routine that you can look forward to.   Perform all exercises about fifteen times, three times per day or as directed.  You should exercise both the operative leg and the other leg as well.   Exercises include:   Quad Sets - Tighten up the muscle on the front of the thigh (Quad) and hold for  5-10 seconds.   Straight Leg Raises - With your knee straight (if you were given a brace, keep it on), lift the leg to 60 degrees, hold for 3 seconds, and slowly lower the leg.  Perform this exercise against resistance later as your leg gets stronger.  Leg Slides: Lying on your back, slowly slide your foot toward your buttocks, bending your knee up off the floor (only go as far as is comfortable). Then slowly slide your foot back down until your leg is flat on the floor again.  Angel Wings: Lying on your back spread your legs to the side as far apart as you can without causing discomfort.  Hamstring Strength:  Lying on your back, push your heel against the floor with your leg straight by tightening up the muscles of your buttocks.  Repeat, but this time bend your knee to a comfortable angle, and push your heel against the floor.  You may put a pillow under the heel to make it more comfortable if necessary.   A rehabilitation program following joint replacement surgery can speed recovery and prevent re-injury in the future due to weakened muscles. Contact your doctor or a physical therapist for more information on knee rehabilitation.    CONSTIPATION  Constipation is defined medically as fewer than three stools per week  and severe constipation as less than one stool per week.  Even if you have a regular bowel pattern at home, your normal regimen is likely to be disrupted due to multiple reasons following surgery.  Combination of anesthesia, postoperative narcotics, change in appetite and fluid intake all can affect your bowels.   YOU MUST use at least one of the following options; they are listed in order of increasing strength to get the job done.  They are all available over the counter, and you may need to use some, POSSIBLY even all of these options:    Drink plenty of fluids (prune juice may be helpful) and high fiber foods Colace 100 mg by mouth twice a day  Senokot for constipation as directed  and as needed Dulcolax (bisacodyl), take with full glass of water  Miralax (polyethylene glycol) once or twice a day as needed.  If you have tried all these things and are unable to have a bowel movement in the first 3-4 days after surgery call either your surgeon or your primary doctor.    If you experience loose stools or diarrhea, hold the medications until you stool forms back up.  If your symptoms do not get better within 1 week or if they get worse, check with your doctor.  If you experience "the worst abdominal pain ever" or develop nausea or vomiting, please contact the office immediately for further recommendations for treatment.   ITCHING:  If you experience itching with your medications, try taking only a single pain pill, or even half a pain pill at a time.  You can also use Benadryl over the counter for itching or also to help with sleep.   TED HOSE STOCKINGS:  Use stockings on both legs until for at least 2 weeks or as directed by physician office. They may be removed at night for sleeping.  MEDICATIONS:  See your medication summary on the "After Visit Summary" that nursing will review with you.  You may have some home medications which will be placed on hold until you complete the course of blood thinner medication.  It is important for you to complete the blood thinner medication as prescribed.  PRECAUTIONS:  If you experience chest pain or shortness of breath - call 911 immediately for transfer to the hospital emergency department.   If you develop a fever greater that 101 F, purulent drainage from wound, increased redness or drainage from wound, foul odor from the wound/dressing, or calf pain - CONTACT YOUR SURGEON.                                                   FOLLOW-UP APPOINTMENTS:  If you do not already have a post-op appointment, please call the office for an appointment to be seen by your surgeon.  Guidelines for how soon to be seen are listed in your "After Visit  Summary", but are typically between 1-4 weeks after surgery.  OTHER INSTRUCTIONS:   Knee Replacement:  Do not place pillow under knee, focus on keeping the knee straight while resting. CPM instructions: 0-90 degrees, 2 hours in the morning, 2 hours in the afternoon, and 2 hours in the evening. Place foam block, curve side up under heel at all times except when in CPM or when walking.  DO NOT modify, tear, cut, or change the foam block  in any way.  MAKE SURE YOU:  Understand these instructions.  Get help right away if you are not doing well or get worse.    Thank you for letting us be a part of your medical care team.  It is a privilege we respect greatly.  We hope these instructions will help you stay on track for a fast and full recovery!   Increase activity slowly as tolerated   Complete by:  As directed       Follow-up Information    Melrose Nakayama, MD. Schedule an appointment as soon as possible for a visit in 2 week(s).   Specialty:  Orthopedic Surgery Contact information: [place] [address] [phone]        [facility], [facility] Follow up.   Specialty:  [place] Why:  A representative from [facility] will contact you to arrange start date and time for your therapy Contact information: [place] [address] [phone]            Signed: Rich Fuchs 11/11/2017, 7:41 AM

## 2017-11-16 ENCOUNTER — Other Ambulatory Visit: Payer: Self-pay

## 2017-11-16 ENCOUNTER — Encounter (HOSPITAL_COMMUNITY): Payer: Self-pay

## 2017-11-16 ENCOUNTER — Inpatient Hospital Stay (HOSPITAL_COMMUNITY)
Admission: EM | Admit: 2017-11-16 | Discharge: 2017-11-19 | DRG: 391 | Disposition: A | Discharge status: Home Health | Payer: Medicare Other | Attending: Internal Medicine | Admitting: Internal Medicine

## 2017-11-16 ENCOUNTER — Emergency Department (HOSPITAL_COMMUNITY): Payer: Medicare Other

## 2017-11-16 DIAGNOSIS — G4733 Obstructive sleep apnea (adult) (pediatric): Secondary | ICD-10-CM | POA: Diagnosis present

## 2017-11-16 DIAGNOSIS — M1990 Unspecified osteoarthritis, unspecified site: Secondary | ICD-10-CM | POA: Diagnosis present

## 2017-11-16 DIAGNOSIS — I5032 Chronic diastolic (congestive) heart failure: Secondary | ICD-10-CM | POA: Diagnosis not present

## 2017-11-16 DIAGNOSIS — I2510 Atherosclerotic heart disease of native coronary artery without angina pectoris: Secondary | ICD-10-CM | POA: Diagnosis present

## 2017-11-16 DIAGNOSIS — K210 Gastro-esophageal reflux disease with esophagitis: Secondary | ICD-10-CM | POA: Diagnosis present

## 2017-11-16 DIAGNOSIS — D120 Benign neoplasm of cecum: Secondary | ICD-10-CM | POA: Diagnosis present

## 2017-11-16 DIAGNOSIS — R195 Other fecal abnormalities: Secondary | ICD-10-CM | POA: Diagnosis not present

## 2017-11-16 DIAGNOSIS — E669 Obesity, unspecified: Secondary | ICD-10-CM | POA: Diagnosis present

## 2017-11-16 DIAGNOSIS — Z79899 Other long term (current) drug therapy: Secondary | ICD-10-CM

## 2017-11-16 DIAGNOSIS — E119 Type 2 diabetes mellitus without complications: Secondary | ICD-10-CM | POA: Diagnosis present

## 2017-11-16 DIAGNOSIS — K2970 Gastritis, unspecified, without bleeding: Secondary | ICD-10-CM | POA: Diagnosis not present

## 2017-11-16 DIAGNOSIS — E7800 Pure hypercholesterolemia, unspecified: Secondary | ICD-10-CM | POA: Diagnosis not present

## 2017-11-16 DIAGNOSIS — K254 Chronic or unspecified gastric ulcer with hemorrhage: Secondary | ICD-10-CM | POA: Diagnosis present

## 2017-11-16 DIAGNOSIS — D509 Iron deficiency anemia, unspecified: Secondary | ICD-10-CM | POA: Diagnosis present

## 2017-11-16 DIAGNOSIS — E785 Hyperlipidemia, unspecified: Secondary | ICD-10-CM | POA: Diagnosis not present

## 2017-11-16 DIAGNOSIS — K264 Chronic or unspecified duodenal ulcer with hemorrhage: Secondary | ICD-10-CM | POA: Diagnosis not present

## 2017-11-16 DIAGNOSIS — R197 Diarrhea, unspecified: Secondary | ICD-10-CM | POA: Diagnosis not present

## 2017-11-16 DIAGNOSIS — Z885 Allergy status to narcotic agent status: Secondary | ICD-10-CM

## 2017-11-16 DIAGNOSIS — D649 Anemia, unspecified: Secondary | ICD-10-CM | POA: Diagnosis present

## 2017-11-16 DIAGNOSIS — Z961 Presence of intraocular lens: Secondary | ICD-10-CM | POA: Diagnosis present

## 2017-11-16 DIAGNOSIS — K922 Gastrointestinal hemorrhage, unspecified: Secondary | ICD-10-CM | POA: Diagnosis present

## 2017-11-16 DIAGNOSIS — Z6835 Body mass index (BMI) 35.0-35.9, adult: Secondary | ICD-10-CM

## 2017-11-16 DIAGNOSIS — I110 Hypertensive heart disease with heart failure: Secondary | ICD-10-CM | POA: Diagnosis present

## 2017-11-16 DIAGNOSIS — K449 Diaphragmatic hernia without obstruction or gangrene: Secondary | ICD-10-CM | POA: Diagnosis present

## 2017-11-16 DIAGNOSIS — Z8249 Family history of ischemic heart disease and other diseases of the circulatory system: Secondary | ICD-10-CM

## 2017-11-16 DIAGNOSIS — I4510 Unspecified right bundle-branch block: Secondary | ICD-10-CM | POA: Diagnosis present

## 2017-11-16 DIAGNOSIS — Z7982 Long term (current) use of aspirin: Secondary | ICD-10-CM

## 2017-11-16 DIAGNOSIS — Z96651 Presence of right artificial knee joint: Secondary | ICD-10-CM | POA: Diagnosis present

## 2017-11-16 DIAGNOSIS — D72829 Elevated white blood cell count, unspecified: Secondary | ICD-10-CM

## 2017-11-16 DIAGNOSIS — E1169 Type 2 diabetes mellitus with other specified complication: Secondary | ICD-10-CM

## 2017-11-16 DIAGNOSIS — Z952 Presence of prosthetic heart valve: Secondary | ICD-10-CM

## 2017-11-16 DIAGNOSIS — Z8701 Personal history of pneumonia (recurrent): Secondary | ICD-10-CM

## 2017-11-16 DIAGNOSIS — R112 Nausea with vomiting, unspecified: Secondary | ICD-10-CM | POA: Diagnosis present

## 2017-11-16 DIAGNOSIS — Z9841 Cataract extraction status, right eye: Secondary | ICD-10-CM

## 2017-11-16 DIAGNOSIS — Z794 Long term (current) use of insulin: Secondary | ICD-10-CM

## 2017-11-16 DIAGNOSIS — Z882 Allergy status to sulfonamides status: Secondary | ICD-10-CM

## 2017-11-16 DIAGNOSIS — Z9842 Cataract extraction status, left eye: Secondary | ICD-10-CM

## 2017-11-16 DIAGNOSIS — Z7902 Long term (current) use of antithrombotics/antiplatelets: Secondary | ICD-10-CM

## 2017-11-16 DIAGNOSIS — Z87440 Personal history of urinary (tract) infections: Secondary | ICD-10-CM

## 2017-11-16 HISTORY — DX: Other fecal abnormalities: R19.5

## 2017-11-16 HISTORY — DX: Elevated white blood cell count, unspecified: D72.829

## 2017-11-16 LAB — TYPE AND SCREEN
ABO/RH(D): A POS
ANTIBODY SCREEN: NEGATIVE

## 2017-11-16 LAB — COMPREHENSIVE METABOLIC PANEL
ALBUMIN: 2.8 g/dL — AB (ref 3.5–5.0)
ALK PHOS: 52 U/L (ref 38–126)
ALT: 13 U/L — AB (ref 17–63)
AST: 20 U/L (ref 15–41)
Anion gap: 10 (ref 5–15)
BILIRUBIN TOTAL: 0.7 mg/dL (ref 0.3–1.2)
BUN: 27 mg/dL — AB (ref 6–20)
CO2: 24 mmol/L (ref 22–32)
CREATININE: 0.93 mg/dL (ref 0.61–1.24)
Calcium: 9.3 mg/dL (ref 8.9–10.3)
Chloride: 101 mmol/L (ref 101–111)
GFR calc Af Amer: >60 mL/min (ref >60)
Glucose, Bld: 123 mg/dL — ABNORMAL HIGH (ref 65–99)
Potassium: 4.4 mmol/L (ref 3.5–5.1)
Sodium: 135 mmol/L (ref 135–145)
TOTAL PROTEIN: 5.8 g/dL — AB (ref 6.5–8.1)

## 2017-11-16 LAB — CBC
HCT: 28.7 % — ABNORMAL LOW (ref 39.0–52.0)
Hemoglobin: 9.6 g/dL — ABNORMAL LOW (ref 13.0–17.0)
MCH: 30.4 pg (ref 26.0–34.0)
MCHC: 33.4 g/dL (ref 30.0–36.0)
MCV: 90.8 fL (ref 78.0–100.0)
PLATELETS: 282 10*3/uL (ref 150–400)
RBC: 3.16 MIL/uL — ABNORMAL LOW (ref 4.22–5.81)
RDW: 13.9 % (ref 11.5–15.5)
WBC: 14 10*3/uL — AB (ref 4.0–10.5)

## 2017-11-16 LAB — POC OCCULT BLOOD, ED: FECAL OCCULT BLD: POSITIVE — AB

## 2017-11-16 LAB — URINALYSIS, ROUTINE W REFLEX MICROSCOPIC
BACTERIA UA: NONE SEEN
BILIRUBIN URINE: NEGATIVE
Glucose, UA: NEGATIVE mg/dL
HGB URINE DIPSTICK: NEGATIVE
Ketones, ur: 20 mg/dL — AB
LEUKOCYTES UA: NEGATIVE
NITRITE: NEGATIVE
Protein, ur: 30 mg/dL — AB
SPECIFIC GRAVITY, URINE: 1.017 (ref 1.005–1.030)
SQUAMOUS EPITHELIAL / LPF: NONE SEEN
pH: 6 (ref 5.0–8.0)

## 2017-11-16 LAB — TROPONIN I: TROPONIN I: 0.06 ng/mL — AB (ref <0.03)

## 2017-11-16 LAB — LIPASE, BLOOD: Lipase: 35 U/L (ref 11–51)

## 2017-11-16 LAB — GLUCOSE, CAPILLARY: GLUCOSE-CAPILLARY: 126 mg/dL — AB (ref 65–99)

## 2017-11-16 MED ORDER — ONDANSETRON HCL 4 MG/2ML IJ SOLN
4.0000 mg | Freq: Four times a day (QID) | INTRAMUSCULAR | Status: DC | PRN
  Administered 2017-11-18: 4 mg via INTRAVENOUS
  Filled 2017-11-16: qty 2

## 2017-11-16 MED ORDER — BOOST / RESOURCE BREEZE PO LIQD CUSTOM
1.0000 | Freq: Three times a day (TID) | ORAL | Status: DC
  Administered 2017-11-17 – 2017-11-19 (×3): 1 via ORAL

## 2017-11-16 MED ORDER — FUROSEMIDE 20 MG PO TABS
20.0000 mg | ORAL_TABLET | Freq: Every day | ORAL | Status: DC
  Administered 2017-11-17 – 2017-11-19 (×3): 20 mg via ORAL
  Filled 2017-11-16 (×3): qty 1

## 2017-11-16 MED ORDER — SODIUM CHLORIDE 0.9 % IV SOLN
INTRAVENOUS | Status: AC
  Administered 2017-11-17: via INTRAVENOUS

## 2017-11-16 MED ORDER — ASPIRIN EC 81 MG PO TBEC: 81.0000 mg | DELAYED_RELEASE_TABLET | Freq: Every day | ORAL | Status: DC

## 2017-11-16 MED ORDER — PRAVASTATIN SODIUM 40 MG PO TABS
80.0000 mg | ORAL_TABLET | Freq: Every evening | ORAL | Status: DC
  Administered 2017-11-16 – 2017-11-18 (×3): 80 mg via ORAL
  Filled 2017-11-16 (×3): qty 2

## 2017-11-16 MED ORDER — SODIUM CHLORIDE 0.9 % IV SOLN
1000.0000 mL | INTRAVENOUS | Status: DC
  Administered 2017-11-16 – 2017-11-17 (×3): 1000 mL via INTRAVENOUS

## 2017-11-16 MED ORDER — ACETAMINOPHEN 325 MG PO TABS
650.0000 mg | ORAL_TABLET | Freq: Four times a day (QID) | ORAL | Status: DC | PRN
  Administered 2017-11-17 (×2): 650 mg via ORAL
  Filled 2017-11-16 (×2): qty 2

## 2017-11-16 MED ORDER — TAMSULOSIN HCL 0.4 MG PO CAPS
0.4000 mg | ORAL_CAPSULE | Freq: Every day | ORAL | Status: DC
  Administered 2017-11-16 – 2017-11-18 (×3): 0.4 mg via ORAL
  Filled 2017-11-16 (×3): qty 1

## 2017-11-16 MED ORDER — ONDANSETRON HCL 4 MG/2ML IJ SOLN
4.0000 mg | Freq: Once | INTRAMUSCULAR | Status: DC
  Filled 2017-11-16: qty 2

## 2017-11-16 MED ORDER — AMLODIPINE BESYLATE 5 MG PO TABS
2.5000 mg | ORAL_TABLET | Freq: Every day | ORAL | Status: DC
  Administered 2017-11-17 – 2017-11-19 (×3): 2.5 mg via ORAL
  Filled 2017-11-16 (×3): qty 1

## 2017-11-16 MED ORDER — CLOPIDOGREL BISULFATE 75 MG PO TABS: 75.0000 mg | ORAL_TABLET | Freq: Every day | ORAL | Status: DC

## 2017-11-16 MED ORDER — SODIUM CHLORIDE 0.9 % IV BOLUS (SEPSIS)
1000.0000 mL | Freq: Once | INTRAVENOUS | Status: AC
  Administered 2017-11-16: 1000 mL via INTRAVENOUS

## 2017-11-16 MED ORDER — IRBESARTAN 150 MG PO TABS
150.0000 mg | ORAL_TABLET | Freq: Every day | ORAL | Status: DC
  Administered 2017-11-17 – 2017-11-19 (×3): 150 mg via ORAL
  Filled 2017-11-16 (×3): qty 1

## 2017-11-16 MED ORDER — TIZANIDINE HCL 4 MG PO TABS
4.0000 mg | ORAL_TABLET | Freq: Four times a day (QID) | ORAL | Status: DC | PRN
  Administered 2017-11-17 (×2): 4 mg via ORAL
  Filled 2017-11-16 (×2): qty 1

## 2017-11-16 MED ORDER — ACETAMINOPHEN 650 MG RE SUPP: 650.0000 mg | Freq: Four times a day (QID) | RECTAL | Status: DC | PRN

## 2017-11-16 NOTE — ED Provider Notes (Signed)
[facility] DEPT Provider Note   CSN: [id number] Arrival date & time: 11/16/17  1545     History   Chief Complaint Chief Complaint  Patient presents with  . Emesis  . Diarrhea  . Medication Reaction    HPI Todd Romero is a 84 y.o. male.  HPI Pt had knee surgery last week.  When he left the hospital he was diagnosed with a UTI and was put on bactrim.  Family stopped the bactrim because they thought it could be that medication.  He also stopped his pain medications.  He does not seem to be getting better.  He is getting weaker.  He is still nauseated.  He is not eating well.  He has been spitting up some clear mucus and occasionally vomiting.  He vomited once today.  He started having loose stools but it was after taking a suppository. He had 6 episodes of diarrhea yesterday but had stool softeners on Monday.  No known fevers.  Past Medical History:  Diagnosis Date  . Anemia   . Arthritis   . CAD (coronary artery disease)    a. cardiac cath 12/2016 showing moderate AS, elevated LVEDP, heavy 3V coronary calcification, 100% mD2, 30-50% LAD, 50-90% stenosis of Cx proximal to origin of L-PDA, RCA not engaged due to poor catheter control (difficult procedure) - coronary status was essentially unchanged from prior.  . Chronic diastolic CHF (congestive heart failure) ([place])   . Degenerative arthritis   . Diabetes ([place])   . Dyspnea   . Essential hypertension   . Hyperlipidemia   . Obesity (BMI 30-39.9) 06/18/2015  . OSA (obstructive sleep apnea)    Severe with AHI 27/hr now on CPAP  no cpap  . Peritonitis ([place]) 1985?  [name] Pneumonia    "6 months - 84 years old"  . Pure hypercholesterolemia   . RBBB   . [facility] spotted fever   . 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 Severe aortic stenosis    a. s/p TAVR 03/2017.  .  Type II or unspecified type diabetes mellitus without mention of complication, not stated as uncontrolled     Patient Active Problem List   Diagnosis Date Noted  . 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 Chronic diastolic heart failure ([place]) 12/18/2015  . Aortic stenosis 12/18/2015  . Obesity (BMI 30-39.9) 06/18/2015  . Obstructive sleep apnea 02/11/2015  . Dyspnea on exertion 02/11/2015  . Primary osteoarthritis of right knee 03/18/2014  . Essential hypertension, benign 06/21/2013    Class: Chronic  . RBBB   . CAD (coronary artery disease)     Class: Chronic  . Hyperlipidemia   . Type II or unspecified type diabetes mellitus without mention of complication, not stated as uncontrolled 05/02/2013    Past Surgical History:  Procedure Laterality Date  . APPENDECTOMY    . CARPAL TUNNEL RELEASE    . COLECTOMY     partial  . EYE SURGERY Bilateral    cataracts removed, lens placed  . JOINT REPLACEMENT    . KNEE CARTILAGE SURGERY    . PARTIAL COLECTOMY    . RIGHT/LEFT HEART CATH AND CORONARY ANGIOGRAPHY N/A 12/28/2016   Procedure: Right/Left Heart Cath and Coronary Angiography;  Surgeon: Belva Crome, MD;  Location: [facility];  Service: Cardiovascular;  Laterality: N/A;  . TEE WITHOUT CARDIOVERSION  N/A 03/15/2017   Procedure: TRANSESOPHAGEAL ECHOCARDIOGRAM (TEE);  Surgeon: Burnell Blanks, MD;  Location: [facility];  Service: Open Heart Surgery;  Laterality: N/A;  . TOTAL KNEE ARTHROPLASTY Right 11/08/2017  . TOTAL KNEE ARTHROPLASTY Right 11/08/2017   Procedure: TOTAL KNEE ARTHROPLASTY;  Surgeon: Melrose Nakayama, MD;  Location: [facility];  Service: Orthopedics;  Laterality: Right;  . TRANSCATHETER AORTIC VALVE REPLACEMENT, TRANSFEMORAL N/A 03/15/2017   Procedure: TRANSCATHETER AORTIC VALVE REPLACEMENT, TRANSFEMORAL;  Surgeon: Burnell Blanks, MD;  Location: [facility];  Service: Open Heart Surgery;  Laterality: N/A;  . VEIN SURGERY         Home  Medications    Prior to Admission medications   Medication Sig Start Date End Date Taking? Authorizing Provider  ACCU-CHEK AVIVA PLUS test strip USE AS INSTRUCTED TO CHECK BLOOD SUGAR TWICE A DAY 12/24/16  Yes [name], MD  acetaminophen (TYLENOL) 500 MG tablet Take 500 mg by mouth every 4 (four) hours as needed for moderate pain or headache.   Yes [provider]  amLODipine (NORVASC) 2.5 MG tablet Take 2.5 mg by mouth daily.   Yes [provider]  aspirin EC 325 MG EC tablet Take 1 tablet (325 mg total) by mouth 2 (two) times daily after a meal. 11/11/17  Yes Macie Burows  aspirin EC 81 MG tablet Take 81 mg by mouth daily.   Yes [provider]  B-D INS SYR ULTRAFINE 1CC/31G 31G X 5/16" 1 ML MISC USE TO INJECT INSULIN DAILY 12/06/16  Yes [name], MD  bisacodyl (DULCOLAX) 5 MG EC tablet Take 1 tablet (5 mg total) by mouth daily as needed for moderate constipation. 11/11/17  Yes Loni Dolly, PA-C  clopidogrel (PLAVIX) 75 MG tablet Take 1 tablet (75 mg total) by mouth daily with breakfast. 10/05/17  Yes Belva Crome, MD  docusate sodium (COLACE) 100 MG capsule Take 1 capsule (100 mg total) by mouth 2 (two) times daily. 11/11/17  Yes Loni Dolly, PA-C  ferrous sulfate 325 (65 FE) MG tablet Take 325 mg by mouth daily with breakfast.   Yes [provider]  fish oil-omega-3 fatty acids 1000 MG capsule Take 1 g by mouth daily at 12 noon.    Yes [provider]  furosemide (LASIX) 20 MG tablet Take 1 tablet (20 mg total) by mouth daily. 04/20/17 11/16/17 Yes Burnell Blanks, MD  glimepiride (AMARYL) 2 MG tablet Take 0.5 tablets (1 mg total) by mouth daily. 05/06/17  Yes [name], MD  HYDROcodone-acetaminophen (NORCO) 7.5-325 MG tablet Take 1-2 tablets by mouth every 4 (four) hours as needed for severe pain. 11/11/17  Yes Loni Dolly, PA-C  insulin lispro (HUMALOG) 100 UNIT/ML injection Inject 4 to 10 units into the skin three (3) times daily with  meals as directed.   Yes [provider]  insulin NPH Human (HUMULIN N,NOVOLIN N) 100 UNIT/ML injection Inject 0.26 mLs (26 Units total) into the skin at bedtime. Uses Vial 08/19/17  Yes [name], MD  Insulin Pen Needle (B-D UF III MINI PEN NEEDLES) 31G X 5 MM MISC USE 2 PEN NEEDLE PER DAY WITH HUMALOG AND VICTOZA 01/14/17  Yes [name], MD  liraglutide 18 MG/3ML SOPN Inject 0.3 mLs (1.8 mg total) into the skin at bedtime. 02/14/17  Yes [name], MD  metFORMIN (GLUMETZA) 500 MG (MOD) 24 hr tablet Take 1 tablet (500 mg total) by mouth 2 (two) times daily with a meal. (morning & noon) 05/06/17  Yes Dwyane Dee,  Vicenta Aly, MD  Multiple Vitamin (MULTIVITAMIN WITH MINERALS) TABS tablet Take 1 tablet by mouth daily.   Yes [provider]  Multiple Vitamins-Minerals (PRESERVISION AREDS 2 PO) Take 1 capsule by mouth 2 (two) times daily.   Yes [provider]  olmesartan (BENICAR) 40 MG tablet Take 1 tablet (40 mg total) by mouth daily. 01/03/17  Yes [name], MD  pravastatin (PRAVACHOL) 80 MG tablet Take 1 tablet (80 mg total) by mouth every evening. 09/19/17  Yes [name], MD  tamsulosin [facility]) 0.4 MG CAPS capsule Take 1 capsule daily 09/19/17  Yes [name], MD  tiZANidine (ZANAFLEX) 4 MG tablet Take 1 tablet (4 mg total) by mouth every 6 (six) hours as needed. 11/11/17 11/11/18 Yes Loni Dolly, PA-C  vitamin C (ASCORBIC ACID) 500 MG tablet Take 500 mg by mouth daily.   Yes [provider]  promethazine (PHENERGAN) 12.5 MG tablet Take 12.5 mg by mouth every 6 (six) hours as needed for nausea/vomiting. 11/14/17   [provider]    Family History Family History  Problem Relation Age of Onset  . Heart failure Father   . Emphysema Father   . Hypertension Mother   . Cancer Sister   . Healthy Sister     Social History Social History   Tobacco Use  . Smoking status: Never Smoker  . Smokeless tobacco: Never Used  Substance Use Topics  . Alcohol use: No    . Drug use: No     Allergies   Bactrim [sulfamethoxazole-trimethoprim] and Morphine and related   Review of Systems Review of Systems  Constitutional: Negative for fever.  Respiratory: Negative for cough and shortness of breath.   Cardiovascular: Negative for chest pain.  Gastrointestinal: Negative for abdominal pain.  Genitourinary: Positive for dysuria.  Psychiatric/Behavioral: Negative for confusion.  All other systems reviewed and are negative.    Physical Exam Updated Vital Signs BP (!) 160/75   Pulse 76   Temp 98.3 F (36.8 C) (Oral)   Resp (!) 24   SpO2 96%   Physical Exam  Constitutional: He appears well-developed and well-nourished. No distress.  HENT:  Head: Normocephalic and atraumatic.  Right Ear: External ear normal.  Left Ear: External ear normal.  Mouth/Throat: No oropharyngeal exudate.  Eyes: Conjunctivae are normal. Right eye exhibits no discharge. Left eye exhibits no discharge. No scleral icterus.  Neck: Neck supple. No tracheal deviation present.  Cardiovascular: Normal rate, regular rhythm and intact distal pulses.  Pulmonary/Chest: Effort normal and breath sounds normal. No stridor. No respiratory distress. He has no wheezes. He has no rales.  Abdominal: Soft. Bowel sounds are normal. He exhibits no distension. There is no tenderness. There is no rebound and no guarding.  Musculoskeletal: He exhibits edema. He exhibits no tenderness.  S/p right knee surgery, edema of leg, no erythema, no drainange  Neurological: He is alert. He has normal strength. No cranial nerve deficit (no facial droop, extraocular movements intact, no slurred speech) or sensory deficit. He exhibits normal muscle tone. He displays no seizure activity. Coordination normal.  Skin: Skin is warm and dry. No rash noted.  Psychiatric: He has a normal mood and affect.  Nursing note and vitals reviewed.    ED Treatments / Results  Labs (all labs ordered are listed, but only  abnormal results are displayed) Labs Reviewed  COMPREHENSIVE METABOLIC PANEL - Abnormal; Notable for the following components:      Result Value   Glucose, Bld 123 (*)  BUN 27 (*)    Total Protein 5.8 (*)    Albumin 2.8 (*)    ALT 13 (*)    All other components within normal limits  CBC - Abnormal; Notable for the following components:   WBC 14.0 (*)    RBC 3.16 (*)    Hemoglobin 9.6 (*)    HCT 28.7 (*)    All other components within normal limits  URINALYSIS, ROUTINE W REFLEX MICROSCOPIC - Abnormal; Notable for the following components:   Ketones, ur 20 (*)    Protein, ur 30 (*)    All other components within normal limits  POC OCCULT BLOOD, ED - Abnormal; Notable for the following components:   Fecal Occult Bld POSITIVE (*)    All other components within normal limits  LIPASE, BLOOD  TYPE AND SCREEN    EKG  EKG Interpretation  Date/Time:  Wednesday November 16 2017 17:29:21 EDT Ventricular Rate:  77 PR Interval:    QRS Duration: 162 QT Interval:  437 QTC Calculation: 495 R Axis:   -33 Text Interpretation:  Sinus rhythm Prolonged PR interval Right bundle branch block t wave abnormality resolved since last tracing Confirmed by Dorie Rank [phone]) on 11/16/2017 5:43:14 PM       Radiology No results found.  Procedures Procedures (including critical care time)  Medications Ordered in ED Medications  ondansetron (ZOFRAN) injection 4 mg (0 mg Intravenous Hold 11/16/17 1648)  sodium chloride 0.9 % bolus 1,000 mL (0 mLs Intravenous Stopped 11/16/17 1800)    Followed by  0.9 %  sodium chloride infusion (1,000 mLs Intravenous New Bag/Given 11/16/17 1648)     Initial Impression / Assessment and Plan / ED Course  I have reviewed the triage vital signs and the nursing notes.  Pertinent labs & imaging results that were available during my care of the patient were reviewed by me and considered in my medical decision making (see chart for details).   Patient presented to the  emergency room for evaluation of weakness following recent knee surgery.  Patient was started on antibiotics for possible urinary tract infection.  Felt like his symptoms escalated despite that.  Patient does not have any signs of urinary tract infection at this time.  Electrolytes do not show any signs of severe dehydration but do suggest a component of mild dehydration.  Patient does have a leukocytosis and his hemoglobin is now 9.6.  This is down from 12.52 weeks ago.  I suspect some of this is related to blood loss from his surgery however he does have guaiac positive stools.  Patient has been having some issues with diarrhea but this was after laxative use.  Patient does not need a blood transfusion at this time I do think he needs further evaluation including hospitalization, possible gi workup and serial hemoglobins.  I will consult with medical for service for admission and further evaluation.  Final Clinical Impressions(s) / ED Diagnoses   Final diagnoses:  Anemia, unspecified type  Gastrointestinal hemorrhage, unspecified gastrointestinal hemorrhage type      Dorie Rank, MD 11/16/17 1940

## 2017-11-16 NOTE — ED Notes (Signed)
Provider at bedside

## 2017-11-16 NOTE — ED Notes (Signed)
8mg  IV Zofran given by [facility] en route.

## 2017-11-16 NOTE — ED Notes (Signed)
Urine culture tube also sent to lab with a "SAVE" label.

## 2017-11-16 NOTE — Progress Notes (Signed)
CRITICAL VALUE STICKER  CRITICAL VALUE: Troponin 0.06  RECEIVER (on-site recipient of call):Hokulani Rogel RN  DATE NOTIFIED: 11/16/17   MD NOTIFIED: [place]  TIME OF NOTIFICATION: 2330  RESPONSE: No new orders at this time.

## 2017-11-16 NOTE — ED Triage Notes (Signed)
Pt reports N/V/D. He was recently released after a  R knee replacement and his doctor thought that he developed a UTI. He was started on Bactrim a couple days ago and he thinks that he is allergic to it because his N/V/D got much worse. A&Ox4.

## 2017-11-16 NOTE — ED Notes (Signed)
Per EDP, hold Zofran.

## 2017-11-16 NOTE — ED Notes (Signed)
Pt given urinal to obtain urine sample.

## 2017-11-16 NOTE — H&P (Signed)
TRH H&P   Patient Demographics:    Todd Romero, is a 84 y.o. male  MRN: [id number]   DOB - [birth year]  Admit Date - 11/16/2017  Outpatient Primary MD for the patient is [name], MD  Referring MD/NP/PA: Marye Round  Outpatient Specialists:    Christena Flake (orthopedics)  Patient coming from: home  Chief Complaint  Patient presents with  . Emesis  . Diarrhea  . Medication Reaction      HPI:    Todd Romero  is a 84 y.o. male, w Hypertension, CAD, CHF (diastolic), AS s/p TAVR,  Dm2, s/p recent  apparently presents w c/o n/v, diarrhea for the past 3 days.  Pt attributed this to Bactrim and stopped.  Pt was feeling increasing weak and therefore presented to ED.   In Ed,  CXR IMPRESSION: No active disease.  Na 135, K 4.4, Bun 27, Creatinine 0.93, Ast 20, Alt 13,  Wbc 14.0, gb 9.6, Plt 282 Urinalysis negative   Pt will be admitted for n/v, diarrhea. Anemia.       Review of systems:    In addition to the HPI above, No Fever-chills, No Headache, No changes with Vision or hearing, No problems swallowing food or Liquids, No Chest pain, Cough or Shortness of Breath, No Abdominal pain,  No Blood in stool or Urine, No dysuria, No new skin rashes or bruises, No new joints pains-aches,  No new weakness, tingling, numbness in any extremity, No recent weight gain or loss, No polyuria, polydypsia or polyphagia, No significant Mental Stressors.  A full 10 point Review of Systems was done, except as stated above, all other Review of Systems were negative.   With Past History of the following :    Past Medical History:  Diagnosis Date  . Anemia   . Arthritis   . CAD (coronary artery disease)    a. cardiac cath 12/2016 showing moderate AS, elevated LVEDP, heavy 3V coronary calcification, 100% mD2, 30-50% LAD, 50-90% stenosis of Cx proximal to origin of  L-PDA, RCA not engaged due to poor catheter control (difficult procedure) - coronary status was essentially unchanged from prior.  . Chronic diastolic CHF (congestive heart failure) ([place])   . Degenerative arthritis   . Diabetes ([place])   . Dyspnea   . Essential hypertension   . Hyperlipidemia   . Obesity (BMI 30-39.9) 06/18/2015  . OSA (obstructive sleep apnea)    Severe with AHI 27/hr now on CPAP  no cpap  . Peritonitis ([place]) 1985?  [name] Pneumonia    "6 months - 84 years old"  . Pure hypercholesterolemia   . RBBB   . [facility] spotted fever   . 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 Severe aortic stenosis    a. s/p TAVR  03/2017.  . Type II or unspecified type diabetes mellitus without mention of complication, not stated as uncontrolled       Past Surgical History:  Procedure Laterality Date  . APPENDECTOMY    . CARPAL TUNNEL RELEASE    . COLECTOMY     partial  . EYE SURGERY Bilateral    cataracts removed, lens placed  . JOINT REPLACEMENT    . KNEE CARTILAGE SURGERY    . PARTIAL COLECTOMY    . RIGHT/LEFT HEART CATH AND CORONARY ANGIOGRAPHY N/A 12/28/2016   Procedure: Right/Left Heart Cath and Coronary Angiography;  Surgeon: Belva Crome, MD;  Location: [place] [facility];  Service: Cardiovascular;  Laterality: N/A;  . TEE WITHOUT CARDIOVERSION N/A 03/15/2017   Procedure: TRANSESOPHAGEAL ECHOCARDIOGRAM (TEE);  Surgeon: Burnell Blanks, MD;  Location: [place];  Service: Open Heart Surgery;  Laterality: N/A;  . TOTAL KNEE ARTHROPLASTY Right 11/08/2017  . TOTAL KNEE ARTHROPLASTY Right 11/08/2017   Procedure: TOTAL KNEE ARTHROPLASTY;  Surgeon: Melrose Nakayama, MD;  Location: [place];  Service: Orthopedics;  Laterality: Right;  . TRANSCATHETER AORTIC VALVE REPLACEMENT, TRANSFEMORAL N/A 03/15/2017   Procedure: TRANSCATHETER AORTIC VALVE REPLACEMENT,  TRANSFEMORAL;  Surgeon: Burnell Blanks, MD;  Location: [place];  Service: Open Heart Surgery;  Laterality: N/A;  . VEIN SURGERY        Social History:     Social History   Tobacco Use  . Smoking status: Never Smoker  . Smokeless tobacco: Never Used  Substance Use Topics  . Alcohol use: No     Lives - at home   Mobility - walks by self   Family History :     Family History  Problem Relation Age of Onset  . Heart failure Father   . Emphysema Father   . Hypertension Mother   . Cancer Sister   . Healthy Sister      Home Medications:   Prior to Admission medications   Medication Sig Start Date End Date Taking? Authorizing Provider  ACCU-CHEK AVIVA PLUS test strip USE AS INSTRUCTED TO CHECK BLOOD SUGAR TWICE A DAY 12/24/16  Yes [name], MD  acetaminophen (TYLENOL) 500 MG tablet Take 500 mg by mouth every 4 (four) hours as needed for moderate pain or headache.   Yes [provider]  amLODipine (NORVASC) 2.5 MG tablet Take 2.5 mg by mouth daily.   Yes [provider]  aspirin EC 325 MG EC tablet Take 1 tablet (325 mg total) by mouth 2 (two) times daily after a meal. 11/11/17  Yes Macie Burows  aspirin EC 81 MG tablet Take 81 mg by mouth daily.   Yes [provider]  B-D INS SYR ULTRAFINE 1CC/31G 31G X 5/16" 1 ML MISC USE TO INJECT INSULIN DAILY 12/06/16  Yes [name], MD  bisacodyl (DULCOLAX) 5 MG EC tablet Take 1 tablet (5 mg total) by mouth daily as needed for moderate constipation. 11/11/17  Yes Loni Dolly, PA-C  clopidogrel (PLAVIX) 75 MG tablet Take 1 tablet (75 mg total) by mouth daily with breakfast. 10/05/17  Yes Belva Crome, MD  docusate sodium (COLACE) 100 MG capsule Take 1 capsule (100 mg total) by mouth 2 (two) times daily. 11/11/17  Yes Loni Dolly, PA-C  ferrous sulfate 325 (65 FE) MG tablet Take 325 mg by mouth daily with breakfast.   Yes [provider]  fish oil-omega-3 fatty acids 1000 MG capsule Take 1 g by  mouth daily at 12 noon.  Yes [provider]  furosemide (LASIX) 20 MG tablet Take 1 tablet (20 mg total) by mouth daily. 04/20/17 11/16/17 Yes Burnell Blanks, MD  glimepiride (AMARYL) 2 MG tablet Take 0.5 tablets (1 mg total) by mouth daily. 05/06/17  Yes [name], MD  HYDROcodone-acetaminophen (NORCO) 7.5-325 MG tablet Take 1-2 tablets by mouth every 4 (four) hours as needed for severe pain. 11/11/17  Yes Loni Dolly, PA-C  insulin lispro (HUMALOG) 100 UNIT/ML injection Inject 4 to 10 units into the skin three (3) times daily with meals as directed.   Yes [provider]  insulin NPH Human (HUMULIN N,NOVOLIN N) 100 UNIT/ML injection Inject 0.26 mLs (26 Units total) into the skin at bedtime. Uses Vial 08/19/17  Yes [name], MD  Insulin Pen Needle (B-D UF III MINI PEN NEEDLES) 31G X 5 MM MISC USE 2 PEN NEEDLE PER DAY WITH HUMALOG AND VICTOZA 01/14/17  Yes [name], MD  liraglutide 18 MG/3ML SOPN Inject 0.3 mLs (1.8 mg total) into the skin at bedtime. 02/14/17  Yes [name], MD  metFORMIN (GLUMETZA) 500 MG (MOD) 24 hr tablet Take 1 tablet (500 mg total) by mouth 2 (two) times daily with a meal. (morning & noon) 05/06/17  Yes [name], MD  Multiple Vitamin (MULTIVITAMIN WITH MINERALS) TABS tablet Take 1 tablet by mouth daily.   Yes [provider]  Multiple Vitamins-Minerals (PRESERVISION AREDS 2 PO) Take 1 capsule by mouth 2 (two) times daily.   Yes [provider]  olmesartan (BENICAR) 40 MG tablet Take 1 tablet (40 mg total) by mouth daily. 01/03/17  Yes [name], MD  pravastatin (PRAVACHOL) 80 MG tablet Take 1 tablet (80 mg total) by mouth every evening. 09/19/17  Yes [name], MD  tamsulosin [facility]) 0.4 MG CAPS capsule Take 1 capsule daily 09/19/17  Yes [name], MD  tiZANidine (ZANAFLEX) 4 MG tablet Take 1 tablet (4 mg total) by mouth every 6 (six) hours as needed. 11/11/17 11/11/18 Yes Loni Dolly, PA-C  vitamin C (ASCORBIC ACID) 500 MG  tablet Take 500 mg by mouth daily.   Yes [provider]  promethazine (PHENERGAN) 12.5 MG tablet Take 12.5 mg by mouth every 6 (six) hours as needed for nausea/vomiting. 11/14/17   [provider]     Allergies:     Allergies  Allergen Reactions  . Bactrim [Sulfamethoxazole-Trimethoprim] Nausea And Vomiting  . Morphine And Related Nausea And Vomiting     Physical Exam:   Vitals  Blood pressure (!) 160/75, pulse 76, temperature 98.3 F (36.8 C), temperature source Oral, resp. rate (!) 24, SpO2 96 %.   1. General lying in bed in NAD,    2. Normal affect and insight, Not Suicidal or Homicidal, Awake Alert, Oriented X 3.  3. No F.N deficits, ALL C.Nerves Intact, Strength 5/5 all 4 extremities, Sensation intact all 4 extremities, Plantars down going.  4. Ears and Eyes appear Normal, Conjunctivae clear, PERRLA. Moist Oral Mucosa.  5. Supple Neck, No JVD, No cervical lymphadenopathy appriciated, No Carotid Bruits.  6. Symmetrical Chest wall movement, Good air movement bilaterally, CTAB.  7. RRR, No Gallops, Rubs or Murmurs, No Parasternal Heave.  8. Positive Bowel Sounds, Abdomen Soft, No tenderness, No organomegaly appriciated,No rebound -guarding or rigidity.  9.  No Cyanosis, Normal Skin Turgor, No Skin Rash or Bruise.  10. Good muscle tone,  joints appear normal , no effusions, Normal ROM.  11. No Palpable Lymph Nodes in Neck or Axillae  Data Review:    CBC Recent Labs  Lab 11/16/17 1646  WBC 14.0*  HGB 9.6*  HCT 28.7*  PLT 282  MCV 90.8  MCH 30.4  MCHC 33.4  RDW 13.9   ------------------------------------------------------------------------------------------------------------------  Chemistries  Recent Labs  Lab 11/16/17 1646  NA 135  K 4.4  CL 101  CO2 24  GLUCOSE 123*  BUN 27*  CREATININE 0.93  CALCIUM 9.3  AST 20  ALT 13*  ALKPHOS 52  BILITOT 0.7    ------------------------------------------------------------------------------------------------------------------ estimated creatinine clearance is 73.8 mL/min (by C-G formula based on SCr of 0.93 mg/dL). ------------------------------------------------------------------------------------------------------------------ No results for input(s): TSH, T4TOTAL, T3FREE, THYROIDAB in the last 72 hours.  Invalid input(s): FREET3  Coagulation profile No results for input(s): INR, PROTIME in the last 168 hours. ------------------------------------------------------------------------------------------------------------------- No results for input(s): DDIMER in the last 72 hours. -------------------------------------------------------------------------------------------------------------------  Cardiac Enzymes No results for input(s): CKMB, TROPONINI, MYOGLOBIN in the last 168 hours.  Invalid input(s): CK ------------------------------------------------------------------------------------------------------------------    Component Value Date/Time   BNP 75.7 12/18/2015 1050     ---------------------------------------------------------------------------------------------------------------  Urinalysis    Component Value Date/Time   COLORURINE YELLOW 11/16/2017 1802   APPEARANCEUR CLEAR 11/16/2017 1802   LABSPEC 1.017 11/16/2017 1802   PHURINE 6.0 11/16/2017 1802   GLUCOSEU NEGATIVE 11/16/2017 1802   GLUCOSEU NEGATIVE 03/14/2014 0826   HGBUR NEGATIVE 11/16/2017 1802   BILIRUBINUR NEGATIVE 11/16/2017 1802   BILIRUBINUR negative 01/22/2016 1019   KETONESUR 20 (A) 11/16/2017 1802   PROTEINUR 30 (A) 11/16/2017 1802   UROBILINOGEN 0.2 01/22/2016 1019   UROBILINOGEN 0.2 03/14/2014 0826   NITRITE NEGATIVE 11/16/2017 1802   LEUKOCYTESUR NEGATIVE 11/16/2017 1802    ----------------------------------------------------------------------------------------------------------------   Imaging  Results:    Dg Chest Portable 1 View  Result Date: 11/16/2017 CLINICAL DATA:  84 year old male with cough and weakness. EXAM: PORTABLE CHEST 1 VIEW COMPARISON:  Chest radiograph dated 11/01/2017 FINDINGS: There is no focal consolidation, pleural effusion, or pneumothorax. Stable cardiomegaly. Aortic valve replacement. Atherosclerotic calcification of the aortic arch. No acute osseous pathology. IMPRESSION: No active disease. Electronically Signed   By: Anner Crete M.D.   On: 11/16/2017 19:46      Assessment & Plan:    Principal Problem:   Anemia Active Problems:   Heme positive stool   Leukocytosis    N/v zofran 4mg  iv q6h prn  Anemia,FOBT + NPO Please consult GI in am Check cbc in am  Leukocytosis Secondary to n/v Check cbc in am  Diarrhea GI pathogen panel, C. dffi  Dm2 fsbs q4h, ISS       DVT Prophylaxis SCDs   AM Labs Ordered, also please review Full Orders  Family Communication: Admission, patients condition and plan of care including tests being ordered have been discussed with the patient who indicate understanding and agree with the plan and Code Status.  Code Status FULL CODE  Likely DC to  home  Condition GUARDED   Consults called: none, please call GI in am  Admission status: observation  Time spent in minutes : 45   Jani Gravel M.D on 11/16/2017 at 8:37 PM  Between 7am to 7pm - Pager - [phone] After 7pm go to [email or web address] - password [facility]  [facility] - Office  [phone]

## 2017-11-17 DIAGNOSIS — D72829 Elevated white blood cell count, unspecified: Secondary | ICD-10-CM

## 2017-11-17 DIAGNOSIS — K922 Gastrointestinal hemorrhage, unspecified: Secondary | ICD-10-CM

## 2017-11-17 DIAGNOSIS — D649 Anemia, unspecified: Secondary | ICD-10-CM

## 2017-11-17 DIAGNOSIS — R195 Other fecal abnormalities: Secondary | ICD-10-CM

## 2017-11-17 LAB — CBC
HCT: 26.7 % — ABNORMAL LOW (ref 39.0–52.0)
Hemoglobin: 8.9 g/dL — ABNORMAL LOW (ref 13.0–17.0)
MCH: 30.7 pg (ref 26.0–34.0)
MCHC: 33.3 g/dL (ref 30.0–36.0)
MCV: 92.1 fL (ref 78.0–100.0)
PLATELETS: 278 10*3/uL (ref 150–400)
RBC: 2.9 MIL/uL — AB (ref 4.22–5.81)
RDW: 14.1 % (ref 11.5–15.5)
WBC: 12.1 10*3/uL — ABNORMAL HIGH (ref 4.0–10.5)

## 2017-11-17 LAB — FERRITIN: Ferritin: 234 ng/mL (ref 24–336)

## 2017-11-17 LAB — COMPREHENSIVE METABOLIC PANEL
ALK PHOS: 44 U/L (ref 38–126)
ALT: 13 U/L — AB (ref 17–63)
AST: 19 U/L (ref 15–41)
Albumin: 2.6 g/dL — ABNORMAL LOW (ref 3.5–5.0)
Anion gap: 9 (ref 5–15)
BUN: 27 mg/dL — AB (ref 6–20)
CALCIUM: 8.8 mg/dL — AB (ref 8.9–10.3)
CO2: 23 mmol/L (ref 22–32)
CREATININE: 0.91 mg/dL (ref 0.61–1.24)
Chloride: 103 mmol/L (ref 101–111)
GFR calc non Af Amer: >60 mL/min (ref >60)
Glucose, Bld: 117 mg/dL — ABNORMAL HIGH (ref 65–99)
Potassium: 4.3 mmol/L (ref 3.5–5.1)
SODIUM: 135 mmol/L (ref 135–145)
Total Bilirubin: 0.7 mg/dL (ref 0.3–1.2)
Total Protein: 5.3 g/dL — ABNORMAL LOW (ref 6.5–8.1)

## 2017-11-17 LAB — GLUCOSE, CAPILLARY
GLUCOSE-CAPILLARY: 240 mg/dL — AB (ref 65–99)
Glucose-Capillary: 110 mg/dL — ABNORMAL HIGH (ref 65–99)
Glucose-Capillary: 132 mg/dL — ABNORMAL HIGH (ref 65–99)
Glucose-Capillary: 114 mg/dL — ABNORMAL HIGH (ref 65–99)
Glucose-Capillary: 115 mg/dL — ABNORMAL HIGH (ref 65–99)

## 2017-11-17 LAB — IRON AND TIBC
IRON: 23 ug/dL — AB (ref 45–182)
Saturation Ratios: 11 % — ABNORMAL LOW (ref 17.9–39.5)
TIBC: 203 ug/dL — AB (ref 250–450)
UIBC: 180 ug/dL

## 2017-11-17 LAB — TROPONIN I
TROPONIN I: 0.06 ng/mL — AB (ref <0.03)
Troponin I: 0.05 ng/mL (ref <0.03)

## 2017-11-17 LAB — VITAMIN B12: Vitamin B-12: 732 pg/mL (ref 180–914)

## 2017-11-17 MED ORDER — HYDROCODONE-ACETAMINOPHEN 7.5-325 MG PO TABS
1.0000 | ORAL_TABLET | ORAL | Status: DC | PRN
  Administered 2017-11-17 – 2017-11-19 (×4): 1 via ORAL
  Filled 2017-11-17 (×4): qty 1

## 2017-11-17 MED ORDER — HYDROCODONE-ACETAMINOPHEN 5-325 MG PO TABS
2.0000 | ORAL_TABLET | Freq: Once | ORAL | Status: AC
  Administered 2017-11-17: 2 via ORAL
  Filled 2017-11-17: qty 2

## 2017-11-17 MED ORDER — INSULIN NPH (HUMAN) (ISOPHANE) 100 UNIT/ML ~~LOC~~ SUSP
26.0000 [IU] | Freq: Every day | SUBCUTANEOUS | Status: DC
  Administered 2017-11-17 – 2017-11-18 (×2): 26 [IU] via SUBCUTANEOUS
  Filled 2017-11-17: qty 10

## 2017-11-17 MED ORDER — INSULIN ASPART 100 UNIT/ML ~~LOC~~ SOLN
0.0000 [IU] | Freq: Three times a day (TID) | SUBCUTANEOUS | Status: DC
  Administered 2017-11-18 – 2017-11-19 (×2): 1 [IU] via SUBCUTANEOUS
  Administered 2017-11-19: 2 [IU] via SUBCUTANEOUS

## 2017-11-17 MED ORDER — SODIUM CHLORIDE 0.9 % IV SOLN
INTRAVENOUS | Status: DC
  Administered 2017-11-17: via INTRAVENOUS

## 2017-11-17 MED ORDER — PEG 3350-KCL-NA BICARB-NACL 420 G PO SOLR
4000.0000 mL | Freq: Once | ORAL | Status: AC
  Administered 2017-11-17: 4000 mL via ORAL

## 2017-11-17 MED ORDER — INSULIN ASPART 100 UNIT/ML ~~LOC~~ SOLN
0.0000 [IU] | Freq: Every day | SUBCUTANEOUS | Status: DC
Start: 2017-11-17 — End: 2017-11-19
  Administered 2017-11-17: 2 [IU] via SUBCUTANEOUS

## 2017-11-17 NOTE — Progress Notes (Signed)
CRITICAL VALUE STICKER  CRITICAL VALUE: Troponin 0.06   MD NOTIFIED: [place]: [id number]  RESPONSE: No new orders at this time

## 2017-11-17 NOTE — Progress Notes (Signed)
PROGRESS NOTE    Todd Romero  BUL:[id number] DOB: 26-May-1934 DOA: 11/16/2017 PCP: [name], MD   Brief Narrative: Todd Romero  is a 84 y.o. male, w Hypertension, CAD, CHF (diastolic), AS s/p TAVR,  Dm2, s/p recent  apparently presents w c/o n/v, diarrhea for the past 3 days.  Pt attributed this to Bactrim and stopped.  Pt was feeling increasing weak and therefore presented to ED. He was admitted for Nausea/Vomiting/Diarrhea and Symptomatic Anemia. Gastroenterology Dr. Benson Norway was consulted and will evaluate the patient. Plan is for EGD/Colonoscopy but Dr. Benson Norway requesting Cardiology to evaluate to clear the patient. Cardiology consulted for further evaluation and Cardiac Clearance.   Assessment & Plan:   Principal Problem:   Anemia Active Problems:   Heme positive stool   Leukocytosis  Intractable Nausea and Vomiting, Improving -Admitted to Telemetry -? Medication induced after taking Bactrim -C/w Antiemetics with Zofran 4 mg q6hprn N/V  Symptomatic Normocytic Anemia with Concern for Acute Drop with FOBT+ -Patient now Clear Liquid Diet and will need to be NPO at [place]  -FOBT was Positive; Patient was taking ASA 325 mg po BID after Knee Arthroscopy and is on Plavix 75 mg po Daily after TAVR; Now back on ASA 81 mg po Daily and continued on Plavix -Gastroenterology Dr. Benson Norway consulted and appreciate further evaluation and recommendations and will defer to him about holding Clopidogrel for possible intervention and endoscopic evaluation.  -Anemia Panel showed Iron level of 23, UIBC of 180, TIBC of 203, Saturation Ratios of 11, Ferritin of 234, and Vitamin B12 of 732 -Hold patient's Ferrous Sulfate 325 mg po daily  -Hb/Hct went from 9.6/28.7 -> 8.9/26.7 but prior to TKA patient's Hb was 12.5 -Continue to Monitor S/Sx of Bleeding -Repeat CBC in AM   Leukocytosis -Likely Reactive to the Nausea and Vomiting -WBC went from 14.0 -> 12.3 -Continue to Monitor for S/Sx of  Infection -Repeat CBC in AM   Diarrhea, improved -Resolved and currently not having any  -Feel like it was Abx induced -GI Pathogen Panel and C Difficile were mentioned in H and P but don't see them ordered -Will not order them as patient has no signs of Diarrhea  Diabetes Mellitus Type 2  -Hold Home Metformin 500 mg po BID, and Liraglutide 1.8 mg qHS -Restarted Home Insulin NPH -Started on Sensitive Novolog SSI -Continue to Monitor CBG's; CBG's ranging from 110-132  Elevated Troponin -Relatively Flat and went from 0.06 -> 0.06 -> 0.05 -Denies any Chest Pain -Cardiology Consulted at request of Dr. Benson Norway  Severe Aortic Stenosis s/p TAVR -C/w ASA 81 mg and Clopidogrel 75 mg po Daily for now; Defer to Cardiology to adjust/ hold  -Per review of Cardiology Notes, Dr. Angelena Form would like the patient to continue Plavix 1 year post TAVR  -Will need to discuss with Cardiology about ASA and Clopidogrel as TAVR was just done June 2018  CAD  -C/w ASA 81 mg po Daily, Clopidogrel 75 mg po Daily for now, Irbesartan substitution of Olmesartan 40 mg po Daily   Essential HTN -C/w Irbesartan 150 mg po Daily substitution of Olmesartan 40 mg po Daily -C/w Amlodipine 2.5 mg po daily    HLD/Hypercholestrolemia -C/w Pravastatin 80 mg po qHS  Obesity  -Weight Loss Counseling given   Chronic Diastolic CHF  -Currently not decompensated -C/w Irbesartan 150 mg po Daily and Furosemide 20 mg po Daily   Recent Total Right Knee Arthroplasty -Was on ASA 325 mg po BID by Ortho Dr. Rhona Raider;  -  I called [name] and spoke to Ortho PA [name] who recommended stopping ASA 325 mg po BID and just resuming ASA 81 mg po daily and Clopidogrel 75 mg po Daily for now -Pain Control with Hydrocodone-Acetaminophen 7.5-325 1-2 tablets q4hprn  DVT prophylaxis: SCD's  Code Status: FULL CODE Family Communication: Discussed with Wife at Bedside Disposition Plan: Remain inpatient for further workup    Consultants:   Gastroenterology Dr. Benson Norway  Case was discussed with [place] PA [name]  Cardiology consulted for Clearance; Briefly discussed case with Primary Cardiologist [name]    Procedures: None   Antimicrobials:  Anti-infectives (From admission, onward)   None     Subjective: Seen and examined and stated Knee was hurting. He was not very hungry but states Diarrhea has improved but has had some Nausea still with some retching. No CP or SOB.   Objective: Vitals:   11/16/17 1830 11/16/17 2157 11/16/17 2246 11/17/17 0507  BP: (!) 160/75 (!) 151/99 (!) 167/68 (!) 134/58  Pulse: 76 77 77 64  Resp: (!) 24 (!) 21 20 20   Temp:   99.8 F (37.7 C) 99.5 F (37.5 C)  TempSrc:   Oral Oral  SpO2: 96% 97% 97% 97%  Weight:   106.5 kg (234 lb 12.6 oz) 107.4 kg (236 lb 12.4 oz)  Height:   5\' 8"  (1.727 m)     Intake/Output Summary (Last 24 hours) at 11/17/2017 0819 Last data filed at 11/17/2017 0600 Gross per 24 hour  Intake 1201.25 ml  Output -  Net 1201.25 ml   Filed Weights   11/16/17 2246 11/17/17 0507  Weight: 106.5 kg (234 lb 12.6 oz) 107.4 kg (236 lb 12.4 oz)   Examination: Physical Exam:  Constitutional: WN/WD Caucasian Male in NAD and appears calm and comfortable Eyes: PERRL, Lids and conjunctivae normal, sclerae anicteric  ENMT: External Ears, Nose appear normal. Grossly normal hearing. Mucous membranes are moist. Neck: Appears normal, supple, no cervical masses, normal ROM, no appreciable thyromegaly, no JVD Respiratory: Diminished to auscultation bilaterally, no wheezing, rales, rhonchi or crackles. Normal respiratory effort and patient is not tachypenic. No accessory muscle use.  Cardiovascular: RRR; Has a Systolic Murmur. S1 and S2 auscultated. 1+ LE extremity edema. Abdomen: Soft, Slightly tender, Distended due to body habitus. No masses palpated. No appreciable hepatosplenomegaly. Bowel sounds positive.  GU: Deferred. Musculoskeletal:  No clubbing / cyanosis of digits/nails. Skin: No rashes, lesions, ulcers on a limited skin eal. No induration; Warm and dry. Right Knee incision appeared C/D/I Neurologic: CN 2-12 grossly intact with no focal deficits.  Romberg sign and cerebellar reflexes not assessed.  Psychiatric: Normal judgment and insight. Alert and oriented x 3. Normal mood and appropriate affect.   Data Reviewed: I have personally reviewed following labs and imaging studies  CBC: Recent Labs  Lab 11/16/17 1646 11/17/17 0418  WBC 14.0* 12.1*  HGB 9.6* 8.9*  HCT 28.7* 26.7*  MCV 90.8 92.1  PLT 282 836   Basic Metabolic Panel: Recent Labs  Lab 11/16/17 1646 11/17/17 0418  NA 135 135  K 4.4 4.3  CL 101 103  CO2 24 23  GLUCOSE 123* 117*  BUN 27* 27*  CREATININE 0.93 0.91  CALCIUM 9.3 8.8*   GFR: Estimated Creatinine Clearance: 75.6 mL/min (by C-G formula based on SCr of 0.91 mg/dL). Liver Function Tests: Recent Labs  Lab 11/16/17 1646 11/17/17 0418  AST 20 19  ALT 13* 13*  ALKPHOS 52 44  BILITOT 0.7 0.7  PROT 5.8* 5.3*  ALBUMIN 2.8* 2.6*   Recent Labs  Lab 11/16/17 1646  LIPASE 35   No results for input(s): AMMONIA in the last 168 hours. Coagulation Profile: No results for input(s): INR, PROTIME in the last 168 hours. Cardiac Enzymes: Recent Labs  Lab 11/16/17 2246 11/17/17 0418  TROPONINI 0.06* 0.06*   BNP (last 3 results) No results for input(s): PROBNP in the last 8760 hours. HbA1C: No results for input(s): HGBA1C in the last 72 hours. CBG: Recent Labs  Lab 11/11/17 0722 11/11/17 1128 11/16/17 2326 11/17/17 0505 11/17/17 0741  GLUCAP 94 190* 126* 110* 132*   Lipid Profile: No results for input(s): CHOL, HDL, LDLCALC, TRIG, CHOLHDL, LDLDIRECT in the last 72 hours. Thyroid Function Tests: No results for input(s): TSH, T4TOTAL, FREET4, T3FREE, THYROIDAB in the last 72 hours. Anemia Panel: Recent Labs    11/16/17 2246  VITAMINB12 732  FERRITIN 234  TIBC 203*   IRON 23*   Sepsis Labs: No results for input(s): PROCALCITON, LATICACIDVEN in the last 168 hours.  No results found for this or any previous visit (from the past 240 hour(s)).   Radiology Studies: Dg Chest Portable 1 View  Result Date: 11/16/2017 CLINICAL DATA:  84 year old male with cough and weakness. EXAM: PORTABLE CHEST 1 VIEW COMPARISON:  Chest radiograph dated 11/01/2017 FINDINGS: There is no focal consolidation, pleural effusion, or pneumothorax. Stable cardiomegaly. Aortic valve replacement. Atherosclerotic calcification of the aortic arch. No acute osseous pathology. IMPRESSION: No active disease. Electronically Signed   By: Anner Crete M.D.   On: 11/16/2017 19:46   Scheduled Meds: . amLODipine  2.5 mg Oral Daily  . aspirin EC  81 mg Oral Daily  . clopidogrel  75 mg Oral Q breakfast  . feeding supplement  1 Container Oral TID BM  . furosemide  20 mg Oral Daily  . irbesartan  150 mg Oral Daily  . ondansetron  4 mg Intravenous Once  . pravastatin  80 mg Oral QPM  . tamsulosin  0.4 mg Oral QPC supper   Continuous Infusions: . sodium chloride 1,000 mL (11/17/17 0657)    LOS: 0 days   Kerney Elbe, DO [facility] Pager [phone]  If 7PM-7AM, please contact night-coverage [email or web address] Password [facility] 11/17/2017, 8:19 AM

## 2017-11-17 NOTE — Anesthesia Preprocedure Evaluation (Addendum)
Anesthesia Evaluation  Patient identified by MRN, date of birth, ID band Patient awake    Reviewed: Allergy & Precautions, NPO status , Patient's Chart, lab work & pertinent test results  Airway Mallampati: III  TM Distance: >3 FB Neck ROM: Full    Dental no notable dental hx.    Pulmonary    Pulmonary exam normal        Cardiovascular hypertension, Normal cardiovascular exam  Echo 04/20/17 s/p TAVR The estimated ejection   fraction was in the range of 55% to 60%. Wall motion was normal;   there were no regional wall motion abnormalities. Features are   consistent with a pseudonormal left ventricular filling pattern,   with concomitant abnormal relaxation and increased filling   pressure (grade 2 diastolic dysfunction). - Aortic valve: A bioprosthesis was present. Valve area (VTI): 1.9   cm^2. Valve area (Vmax): 1.86 cm^2. Valve area (Vmean): 1.71   cm^2.   Neuro/Psych negative neurological ROS     GI/Hepatic negative GI ROS,   Endo/Other  diabetes  Renal/GU      Musculoskeletal   Abdominal (+) + obese,   Peds  Hematology negative hematology ROS (+)   Anesthesia Other Findings   Reproductive/Obstetrics                            Lab Results  Component Value Date   WBC 12.1 (H) 11/17/2017   HGB 8.9 (L) 11/17/2017   HCT 26.7 (L) 11/17/2017   MCV 92.1 11/17/2017   PLT 278 11/17/2017    Anesthesia Physical Anesthesia Plan  ASA: III  Anesthesia Plan: MAC   Post-op Pain Management:    Induction:   PONV Risk Score and Plan:   Airway Management Planned: Mask, Natural Airway and Nasal Cannula  Additional Equipment:   Intra-op Plan:   Post-operative Plan:   Informed Consent: I have reviewed the patients History and Physical, chart, labs and discussed the procedure including the risks, benefits and alternatives for the proposed anesthesia with the patient or authorized  representative who has indicated his/her understanding and acceptance.     Plan Discussed with: CRNA  Anesthesia Plan Comments:         Anesthesia Quick Evaluation

## 2017-11-17 NOTE — H&P (View-Only) (Signed)
Reason for Consult: Diarrhea and anemia Referring Physician: [facility]  Todd Romero HPI: This is an 84 year old male with a PMH of CAD, CHF, DM, HTN, OSA, and severe aortic stenosis admitted for persistent nausea, vomiting, and diarrhea.  He underwent an elective right knee surgery last Tuesday and by Friday he was able to be discharged home.  He had some complaints of burning with urination and he was prescribed Bactrim.  The patient took his first dose Saturday and shortly afterwards he did not feel well.  His symptoms of nausea, vomiting, and diarrhea continued to progress.  He did not take any further Bactrim, but his symptoms continued to persist.  He felt weaker as the week progressed and subsequently presented to the ER.  On admission his HGB was found to be at 9.6 with a drop down to 8.9 g/dL.  His baseline over the past several years was 10-11 g/dL.  He had a screening colonoscopy with Dr. Collene Mares on 05/17/2012 with normal findings.  Today was noted to have a mild elevation in his troponin.  Past Medical History:  Diagnosis Date  . Anemia   . Arthritis   . CAD (coronary artery disease)    a. cardiac cath 12/2016 showing moderate AS, elevated LVEDP, heavy 3V coronary calcification, 100% mD2, 30-50% LAD, 50-90% stenosis of Cx proximal to origin of L-PDA, RCA not engaged due to poor catheter control (difficult procedure) - coronary status was essentially unchanged from prior.  . Chronic diastolic CHF (congestive heart failure) ([place])   . Degenerative arthritis   . Diabetes ([place])   . Dyspnea   . Essential hypertension   . Hyperlipidemia   . Obesity (BMI 30-39.9) 06/18/2015  . OSA (obstructive sleep apnea)    Severe with AHI 27/hr now on CPAP  no cpap  . Peritonitis ([place]) 1985?  [name] Pneumonia    "6 months - 84 years old"  . Pure hypercholesterolemia   . RBBB   . [facility] spotted fever   . 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 Severe aortic stenosis    a. s/p TAVR 03/2017.  . Type II or unspecified type diabetes mellitus without mention of complication, not stated as uncontrolled     Past Surgical History:  Procedure Laterality Date  . APPENDECTOMY    . CARPAL TUNNEL RELEASE    . COLECTOMY     partial  . EYE SURGERY Bilateral    cataracts removed, lens placed  . JOINT REPLACEMENT    . KNEE CARTILAGE SURGERY    . PARTIAL COLECTOMY    . RIGHT/LEFT HEART CATH AND CORONARY ANGIOGRAPHY N/A 12/28/2016   Procedure: Right/Left Heart Cath and Coronary Angiography;  Surgeon: Belva Crome, MD;  Location: [place] [facility];  Service: Cardiovascular;  Laterality: N/A;  . TEE WITHOUT CARDIOVERSION N/A 03/15/2017   Procedure: TRANSESOPHAGEAL ECHOCARDIOGRAM (TEE);  Surgeon: Burnell Blanks, MD;  Location: [place];  Service: Open Heart Surgery;  Laterality: N/A;  . TOTAL KNEE ARTHROPLASTY Right 11/08/2017  . TOTAL KNEE ARTHROPLASTY Right 11/08/2017   Procedure: TOTAL KNEE ARTHROPLASTY;  Surgeon: Melrose Nakayama, MD;  Location: [place];  Service: Orthopedics;  Laterality: Right;  . TRANSCATHETER AORTIC VALVE REPLACEMENT, TRANSFEMORAL N/A 03/15/2017   Procedure: TRANSCATHETER AORTIC VALVE REPLACEMENT, TRANSFEMORAL;  Surgeon: Burnell Blanks, MD;  Location: [place];  Service: Open Heart Surgery;  Laterality:  N/A;  . VEIN SURGERY      Family History  Problem Relation Age of Onset  . Heart failure Father   . Emphysema Father   . Hypertension Mother   . Cancer Sister   . Healthy Sister     Social History:  reports that  has never smoked. he has never used smokeless tobacco. He reports that he does not drink alcohol or use drugs.  Allergies:  Allergies  Allergen Reactions  . Bactrim [Sulfamethoxazole-Trimethoprim] Nausea And Vomiting  . Morphine And Related Nausea And Vomiting    Medications:  Scheduled: . amLODipine  2.5 mg  Oral Daily  . aspirin EC  81 mg Oral Daily  . clopidogrel  75 mg Oral Q breakfast  . feeding supplement  1 Container Oral TID BM  . furosemide  20 mg Oral Daily  . insulin aspart  0-5 Units Subcutaneous QHS  . insulin aspart  0-9 Units Subcutaneous TID WC  . insulin NPH Human  26 Units Subcutaneous QHS  . irbesartan  150 mg Oral Daily  . ondansetron  4 mg Intravenous Once  . pravastatin  80 mg Oral QPM  . tamsulosin  0.4 mg Oral QPC supper   Continuous: . sodium chloride 1,000 mL (11/17/17 1206)    Results for orders placed or performed during the hospital encounter of 11/16/17 (from the past 24 hour(s))  Lipase, blood     Status: None   Collection Time: 11/16/17  4:46 PM  Result Value Ref Range   Lipase 35 11 - 51 U/L  Comprehensive metabolic panel     Status: Abnormal   Collection Time: 11/16/17  4:46 PM  Result Value Ref Range   Sodium 135 135 - 145 mmol/L   Potassium 4.4 3.5 - 5.1 mmol/L   Chloride 101 101 - 111 mmol/L   CO2 24 22 - 32 mmol/L   Glucose, Bld 123 (H) 65 - 99 mg/dL   BUN 27 (H) 6 - 20 mg/dL   Creatinine, Ser 0.93 0.61 - 1.24 mg/dL   Calcium 9.3 8.9 - 10.3 mg/dL   Total Protein 5.8 (L) 6.5 - 8.1 g/dL   Albumin 2.8 (L) 3.5 - 5.0 g/dL   AST 20 15 - 41 U/L   ALT 13 (L) 17 - 63 U/L   Alkaline Phosphatase 52 38 - 126 U/L   Total Bilirubin 0.7 0.3 - 1.2 mg/dL   GFR calc non Af Amer >60 >60 mL/min   GFR calc Af Amer >60 >60 mL/min   Anion gap 10 5 - 15  CBC     Status: Abnormal   Collection Time: 11/16/17  4:46 PM  Result Value Ref Range   WBC 14.0 (H) 4.0 - 10.5 K/uL   RBC 3.16 (L) 4.22 - 5.81 MIL/uL   Hemoglobin 9.6 (L) 13.0 - 17.0 g/dL   HCT 28.7 (L) 39.0 - 52.0 %   MCV 90.8 78.0 - 100.0 fL   MCH 30.4 26.0 - 34.0 pg   MCHC 33.4 30.0 - 36.0 g/dL   RDW 13.9 11.5 - 15.5 %   Platelets 282 150 - 400 K/uL  Urinalysis, Routine w reflex microscopic     Status: Abnormal   Collection Time: 11/16/17  6:02 PM  Result Value Ref Range   Color, Urine YELLOW  YELLOW   APPearance CLEAR CLEAR   Specific Gravity, Urine 1.017 1.005 - 1.030   pH 6.0 5.0 - 8.0   Glucose, UA NEGATIVE NEGATIVE mg/dL   Hgb urine  dipstick NEGATIVE NEGATIVE   Bilirubin Urine NEGATIVE NEGATIVE   Ketones, ur 20 (A) NEGATIVE mg/dL   Protein, ur 30 (A) NEGATIVE mg/dL   Nitrite NEGATIVE NEGATIVE   Leukocytes, UA NEGATIVE NEGATIVE   RBC / HPF 0-5 0 - 5 RBC/hpf   WBC, UA 0-5 0 - 5 WBC/hpf   Bacteria, UA NONE SEEN NONE SEEN   Squamous Epithelial / LPF NONE SEEN NONE SEEN   Mucus PRESENT   POC occult blood, ED     Status: Abnormal   Collection Time: 11/16/17  6:28 PM  Result Value Ref Range   Fecal Occult Bld POSITIVE (A) NEGATIVE  Type and screen [place]     Status: None   Collection Time: 11/16/17  8:00 PM  Result Value Ref Range   ABO/RH(D) A POS    Antibody Screen NEG    Sample Expiration      11/19/2017 Performed at [facility] [address] [place],  [address]   Ferritin     Status: None   Collection Time: 11/16/17 10:46 PM  Result Value Ref Range   Ferritin 234 24 - 336 ng/mL  Iron and TIBC     Status: Abnormal   Collection Time: 11/16/17 10:46 PM  Result Value Ref Range   Iron 23 (L) 45 - 182 ug/dL   TIBC 203 (L) 250 - 450 ug/dL   Saturation Ratios 11 (L) 17.9 - 39.5 %   UIBC 180 ug/dL  Vitamin B12     Status: None   Collection Time: 11/16/17 10:46 PM  Result Value Ref Range   Vitamin B-12 732 180 - 914 pg/mL  Troponin I     Status: Abnormal   Collection Time: 11/16/17 10:46 PM  Result Value Ref Range   Troponin I 0.06 (HH) <0.03 ng/mL  Glucose, capillary     Status: Abnormal   Collection Time: 11/16/17 11:26 PM  Result Value Ref Range   Glucose-Capillary 126 (H) 65 - 99 mg/dL  Comprehensive metabolic panel     Status: Abnormal   Collection Time: 11/17/17  4:18 AM  Result Value Ref Range   Sodium 135 135 - 145 mmol/L   Potassium 4.3 3.5 - 5.1 mmol/L   Chloride 103 101 - 111 mmol/L   CO2 23  22 - 32 mmol/L   Glucose, Bld 117 (H) 65 - 99 mg/dL   BUN 27 (H) 6 - 20 mg/dL   Creatinine, Ser 0.91 0.61 - 1.24 mg/dL   Calcium 8.8 (L) 8.9 - 10.3 mg/dL   Total Protein 5.3 (L) 6.5 - 8.1 g/dL   Albumin 2.6 (L) 3.5 - 5.0 g/dL   AST 19 15 - 41 U/L   ALT 13 (L) 17 - 63 U/L   Alkaline Phosphatase 44 38 - 126 U/L   Total Bilirubin 0.7 0.3 - 1.2 mg/dL   GFR calc non Af Amer >60 >60 mL/min   GFR calc Af Amer >60 >60 mL/min   Anion gap 9 5 - 15  CBC     Status: Abnormal   Collection Time: 11/17/17  4:18 AM  Result Value Ref Range   WBC 12.1 (H) 4.0 - 10.5 K/uL   RBC 2.90 (L) 4.22 - 5.81 MIL/uL   Hemoglobin 8.9 (L) 13.0 - 17.0 g/dL   HCT 26.7 (L) 39.0 - 52.0 %   MCV 92.1 78.0 - 100.0 fL   MCH 30.7 26.0 - 34.0 pg   MCHC 33.3 30.0 - 36.0 g/dL  RDW 14.1 11.5 - 15.5 %   Platelets 278 150 - 400 K/uL  Troponin I     Status: Abnormal   Collection Time: 11/17/17  4:18 AM  Result Value Ref Range   Troponin I 0.06 (HH) <0.03 ng/mL  Glucose, capillary     Status: Abnormal   Collection Time: 11/17/17  5:05 AM  Result Value Ref Range   Glucose-Capillary 110 (H) 65 - 99 mg/dL   Comment 1 Notify RN   Glucose, capillary     Status: Abnormal   Collection Time: 11/17/17  7:41 AM  Result Value Ref Range   Glucose-Capillary 132 (H) 65 - 99 mg/dL  Troponin I     Status: Abnormal   Collection Time: 11/17/17 10:27 AM  Result Value Ref Range   Troponin I 0.05 (HH) <0.03 ng/mL  Glucose, capillary     Status: Abnormal   Collection Time: 11/17/17 12:51 PM  Result Value Ref Range   Glucose-Capillary 114 (H) 65 - 99 mg/dL     Dg Chest Portable 1 View  Result Date: 11/16/2017 CLINICAL DATA:  85 year old male with cough and weakness. EXAM: PORTABLE CHEST 1 VIEW COMPARISON:  Chest radiograph dated 11/01/2017 FINDINGS: There is no focal consolidation, pleural effusion, or pneumothorax. Stable cardiomegaly. Aortic valve replacement. Atherosclerotic calcification of the aortic arch. No acute osseous  pathology. IMPRESSION: No active disease. Electronically Signed   By: Anner Crete M.D.   On: 11/16/2017 19:46    ROS:  As stated above in the HPI otherwise negative.  Blood pressure (!) 108/47, pulse (!) 56, temperature 98.1 F (36.7 C), temperature source Oral, resp. rate 18, height 5\' 8"  (1.727 m), weight 107.4 kg (236 lb 12.4 oz), SpO2 97 %.    PE: Gen: NAD, Alert and Oriented HEENT:  [place]/AT, EOMI Neck: Supple, no LAD Lungs: CTA Bilaterally CV: RRR without M/G/R ABM: Soft, NTND, +BS Ext: No C/C/E  Assessment/Plan: 1) Anemia. 2) Diarrhea. 3) Nausea/Vomiting.   His GI symptoms have abated, however, I am not certain about the cause for his anemia.  There was no overt evidence of bleeding, but he is heme positive.  There is a clear drop in his HGB.  It is reasonable to perform an EGD/Colonoscopy for further evaluation, however, the patient is not certain if he can tolerate the prep.  Plan: 1) EGD/Colonoscopy in the AM. 2) Cardiac clearance is necessary with the elevated troponin and his significant cardiac history.  Todd Romero D 11/17/2017, 2:23 PM

## 2017-11-17 NOTE — Consult Note (Signed)
Reason for Consult: Diarrhea and anemia Referring Physician: [facility]  Todd Romero HPI: This is an 84 year old male with a PMH of CAD, CHF, DM, HTN, OSA, and severe aortic stenosis admitted for persistent nausea, vomiting, and diarrhea.  He underwent an elective right knee surgery last Tuesday and by Friday he was able to be discharged home.  He had some complaints of burning with urination and he was prescribed Bactrim.  The patient took his first dose Saturday and shortly afterwards he did not feel well.  His symptoms of nausea, vomiting, and diarrhea continued to progress.  He did not take any further Bactrim, but his symptoms continued to persist.  He felt weaker as the week progressed and subsequently presented to the ER.  On admission his HGB was found to be at 9.6 with a drop down to 8.9 g/dL.  His baseline over the past several years was 10-11 g/dL.  He had a screening colonoscopy with Dr. Collene Mares on 05/17/2012 with normal findings.  Today was noted to have a mild elevation in his troponin.  Past Medical History:  Diagnosis Date  . Anemia   . Arthritis   . CAD (coronary artery disease)    a. cardiac cath 12/2016 showing moderate AS, elevated LVEDP, heavy 3V coronary calcification, 100% mD2, 30-50% LAD, 50-90% stenosis of Cx proximal to origin of L-PDA, RCA not engaged due to poor catheter control (difficult procedure) - coronary status was essentially unchanged from prior.  . Chronic diastolic CHF (congestive heart failure) ([place])   . Degenerative arthritis   . Diabetes ([place])   . Dyspnea   . Essential hypertension   . Hyperlipidemia   . Obesity (BMI 30-39.9) 06/18/2015  . OSA (obstructive sleep apnea)    Severe with AHI 27/hr now on CPAP  no cpap  . Peritonitis ([place]) 1985?  [name] Pneumonia    "6 months - 84 years old"  . Pure hypercholesterolemia   . RBBB   . [facility] spotted fever   . 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 Severe aortic stenosis    a. s/p TAVR 03/2017.  . Type II or unspecified type diabetes mellitus without mention of complication, not stated as uncontrolled     Past Surgical History:  Procedure Laterality Date  . APPENDECTOMY    . CARPAL TUNNEL RELEASE    . COLECTOMY     partial  . EYE SURGERY Bilateral    cataracts removed, lens placed  . JOINT REPLACEMENT    . KNEE CARTILAGE SURGERY    . PARTIAL COLECTOMY    . RIGHT/LEFT HEART CATH AND CORONARY ANGIOGRAPHY N/A 12/28/2016   Procedure: Right/Left Heart Cath and Coronary Angiography;  Surgeon: Belva Crome, MD;  Location: [place];  Service: Cardiovascular;  Laterality: N/A;  . TEE WITHOUT CARDIOVERSION N/A 03/15/2017   Procedure: TRANSESOPHAGEAL ECHOCARDIOGRAM (TEE);  Surgeon: Burnell Blanks, MD;  Location: [place];  Service: Open Heart Surgery;  Laterality: N/A;  . TOTAL KNEE ARTHROPLASTY Right 11/08/2017  . TOTAL KNEE ARTHROPLASTY Right 11/08/2017   Procedure: TOTAL KNEE ARTHROPLASTY;  Surgeon: Melrose Nakayama, MD;  Location: [place];  Service: Orthopedics;  Laterality: Right;  . TRANSCATHETER AORTIC VALVE REPLACEMENT, TRANSFEMORAL N/A 03/15/2017   Procedure: TRANSCATHETER AORTIC VALVE REPLACEMENT, TRANSFEMORAL;  Surgeon: Burnell Blanks, MD;  Location: [place];  Service: Open Heart Surgery;  Laterality:  N/A;  . VEIN SURGERY      Family History  Problem Relation Age of Onset  . Heart failure Father   . Emphysema Father   . Hypertension Mother   . Cancer Sister   . Healthy Sister     Social History:  reports that  has never smoked. he has never used smokeless tobacco. He reports that he does not drink alcohol or use drugs.  Allergies:  Allergies  Allergen Reactions  . Bactrim [Sulfamethoxazole-Trimethoprim] Nausea And Vomiting  . Morphine And Related Nausea And Vomiting    Medications:  Scheduled: . amLODipine  2.5 mg  Oral Daily  . aspirin EC  81 mg Oral Daily  . clopidogrel  75 mg Oral Q breakfast  . feeding supplement  1 Container Oral TID BM  . furosemide  20 mg Oral Daily  . insulin aspart  0-5 Units Subcutaneous QHS  . insulin aspart  0-9 Units Subcutaneous TID WC  . insulin NPH Human  26 Units Subcutaneous QHS  . irbesartan  150 mg Oral Daily  . ondansetron  4 mg Intravenous Once  . pravastatin  80 mg Oral QPM  . tamsulosin  0.4 mg Oral QPC supper   Continuous: . sodium chloride 1,000 mL (11/17/17 1206)    Results for orders placed or performed during the hospital encounter of 11/16/17 (from the past 24 hour(s))  Lipase, blood     Status: None   Collection Time: 11/16/17  4:46 PM  Result Value Ref Range   Lipase 35 11 - 51 U/L  Comprehensive metabolic panel     Status: Abnormal   Collection Time: 11/16/17  4:46 PM  Result Value Ref Range   Sodium 135 135 - 145 mmol/L   Potassium 4.4 3.5 - 5.1 mmol/L   Chloride 101 101 - 111 mmol/L   CO2 24 22 - 32 mmol/L   Glucose, Bld 123 (H) 65 - 99 mg/dL   BUN 27 (H) 6 - 20 mg/dL   Creatinine, Ser 0.93 0.61 - 1.24 mg/dL   Calcium 9.3 8.9 - 10.3 mg/dL   Total Protein 5.8 (L) 6.5 - 8.1 g/dL   Albumin 2.8 (L) 3.5 - 5.0 g/dL   AST 20 15 - 41 U/L   ALT 13 (L) 17 - 63 U/L   Alkaline Phosphatase 52 38 - 126 U/L   Total Bilirubin 0.7 0.3 - 1.2 mg/dL   GFR calc non Af Amer >60 >60 mL/min   GFR calc Af Amer >60 >60 mL/min   Anion gap 10 5 - 15  CBC     Status: Abnormal   Collection Time: 11/16/17  4:46 PM  Result Value Ref Range   WBC 14.0 (H) 4.0 - 10.5 K/uL   RBC 3.16 (L) 4.22 - 5.81 MIL/uL   Hemoglobin 9.6 (L) 13.0 - 17.0 g/dL   HCT 28.7 (L) 39.0 - 52.0 %   MCV 90.8 78.0 - 100.0 fL   MCH 30.4 26.0 - 34.0 pg   MCHC 33.4 30.0 - 36.0 g/dL   RDW 13.9 11.5 - 15.5 %   Platelets 282 150 - 400 K/uL  Urinalysis, Routine w reflex microscopic     Status: Abnormal   Collection Time: 11/16/17  6:02 PM  Result Value Ref Range   Color, Urine YELLOW  YELLOW   APPearance CLEAR CLEAR   Specific Gravity, Urine 1.017 1.005 - 1.030   pH 6.0 5.0 - 8.0   Glucose, UA NEGATIVE NEGATIVE mg/dL   Hgb urine  dipstick NEGATIVE NEGATIVE   Bilirubin Urine NEGATIVE NEGATIVE   Ketones, ur 20 (A) NEGATIVE mg/dL   Protein, ur 30 (A) NEGATIVE mg/dL   Nitrite NEGATIVE NEGATIVE   Leukocytes, UA NEGATIVE NEGATIVE   RBC / HPF 0-5 0 - 5 RBC/hpf   WBC, UA 0-5 0 - 5 WBC/hpf   Bacteria, UA NONE SEEN NONE SEEN   Squamous Epithelial / LPF NONE SEEN NONE SEEN   Mucus PRESENT   POC occult blood, ED     Status: Abnormal   Collection Time: 11/16/17  6:28 PM  Result Value Ref Range   Fecal Occult Bld POSITIVE (A) NEGATIVE  Type and screen [place]     Status: None   Collection Time: 11/16/17  8:00 PM  Result Value Ref Range   ABO/RH(D) A POS    Antibody Screen NEG    Sample Expiration      11/19/2017 Performed at [facility] [address], [place], [place] [address]   Ferritin     Status: None   Collection Time: 11/16/17 10:46 PM  Result Value Ref Range   Ferritin 234 24 - 336 ng/mL  Iron and TIBC     Status: Abnormal   Collection Time: 11/16/17 10:46 PM  Result Value Ref Range   Iron 23 (L) 45 - 182 ug/dL   TIBC 203 (L) 250 - 450 ug/dL   Saturation Ratios 11 (L) 17.9 - 39.5 %   UIBC 180 ug/dL  Vitamin B12     Status: None   Collection Time: 11/16/17 10:46 PM  Result Value Ref Range   Vitamin B-12 732 180 - 914 pg/mL  Troponin I     Status: Abnormal   Collection Time: 11/16/17 10:46 PM  Result Value Ref Range   Troponin I 0.06 (HH) <0.03 ng/mL  Glucose, capillary     Status: Abnormal   Collection Time: 11/16/17 11:26 PM  Result Value Ref Range   Glucose-Capillary 126 (H) 65 - 99 mg/dL  Comprehensive metabolic panel     Status: Abnormal   Collection Time: 11/17/17  4:18 AM  Result Value Ref Range   Sodium 135 135 - 145 mmol/L   Potassium 4.3 3.5 - 5.1 mmol/L   Chloride 103 101 - 111 mmol/L   CO2 23  22 - 32 mmol/L   Glucose, Bld 117 (H) 65 - 99 mg/dL   BUN 27 (H) 6 - 20 mg/dL   Creatinine, Ser 0.91 0.61 - 1.24 mg/dL   Calcium 8.8 (L) 8.9 - 10.3 mg/dL   Total Protein 5.3 (L) 6.5 - 8.1 g/dL   Albumin 2.6 (L) 3.5 - 5.0 g/dL   AST 19 15 - 41 U/L   ALT 13 (L) 17 - 63 U/L   Alkaline Phosphatase 44 38 - 126 U/L   Total Bilirubin 0.7 0.3 - 1.2 mg/dL   GFR calc non Af Amer >60 >60 mL/min   GFR calc Af Amer >60 >60 mL/min   Anion gap 9 5 - 15  CBC     Status: Abnormal   Collection Time: 11/17/17  4:18 AM  Result Value Ref Range   WBC 12.1 (H) 4.0 - 10.5 K/uL   RBC 2.90 (L) 4.22 - 5.81 MIL/uL   Hemoglobin 8.9 (L) 13.0 - 17.0 g/dL   HCT 26.7 (L) 39.0 - 52.0 %   MCV 92.1 78.0 - 100.0 fL   MCH 30.7 26.0 - 34.0 pg   MCHC 33.3 30.0 - 36.0 g/dL  RDW 14.1 11.5 - 15.5 %   Platelets 278 150 - 400 K/uL  Troponin I     Status: Abnormal   Collection Time: 11/17/17  4:18 AM  Result Value Ref Range   Troponin I 0.06 (HH) <0.03 ng/mL  Glucose, capillary     Status: Abnormal   Collection Time: 11/17/17  5:05 AM  Result Value Ref Range   Glucose-Capillary 110 (H) 65 - 99 mg/dL   Comment 1 Notify RN   Glucose, capillary     Status: Abnormal   Collection Time: 11/17/17  7:41 AM  Result Value Ref Range   Glucose-Capillary 132 (H) 65 - 99 mg/dL  Troponin I     Status: Abnormal   Collection Time: 11/17/17 10:27 AM  Result Value Ref Range   Troponin I 0.05 (HH) <0.03 ng/mL  Glucose, capillary     Status: Abnormal   Collection Time: 11/17/17 12:51 PM  Result Value Ref Range   Glucose-Capillary 114 (H) 65 - 99 mg/dL     Dg Chest Portable 1 View  Result Date: 11/16/2017 CLINICAL DATA:  84 year old male with cough and weakness. EXAM: PORTABLE CHEST 1 VIEW COMPARISON:  Chest radiograph dated 11/01/2017 FINDINGS: There is no focal consolidation, pleural effusion, or pneumothorax. Stable cardiomegaly. Aortic valve replacement. Atherosclerotic calcification of the aortic arch. No acute osseous  pathology. IMPRESSION: No active disease. Electronically Signed   By: Anner Crete M.D.   On: 11/16/2017 19:46    ROS:  As stated above in the HPI otherwise negative.  Blood pressure (!) 108/47, pulse (!) 56, temperature 98.1 F (36.7 C), temperature source Oral, resp. rate 18, height 5\' 8"  (1.727 m), weight 107.4 kg (236 lb 12.4 oz), SpO2 97 %.    PE: Gen: NAD, Alert and Oriented HEENT:  [place]/AT, EOMI Neck: Supple, no LAD Lungs: CTA Bilaterally CV: RRR without M/G/R ABM: Soft, NTND, +BS Ext: No C/C/E  Assessment/Plan: 1) Anemia. 2) Diarrhea. 3) Nausea/Vomiting.   His GI symptoms have abated, however, I am not certain about the cause for his anemia.  There was no overt evidence of bleeding, but he is heme positive.  There is a clear drop in his HGB.  It is reasonable to perform an EGD/Colonoscopy for further evaluation, however, the patient is not certain if he can tolerate the prep.  Plan: 1) EGD/Colonoscopy in the AM. 2) Cardiac clearance is necessary with the elevated troponin and his significant cardiac history.  Todd Romero D 11/17/2017, 2:23 PM

## 2017-11-18 ENCOUNTER — Observation Stay (HOSPITAL_COMMUNITY): Payer: Medicare Other | Admitting: Anesthesiology

## 2017-11-18 ENCOUNTER — Encounter (HOSPITAL_COMMUNITY)
Admission: EM | Disposition: A | Discharge status: Home Health | Payer: Self-pay | Source: Home / Self Care | Attending: Internal Medicine

## 2017-11-18 ENCOUNTER — Encounter (HOSPITAL_COMMUNITY): Payer: Self-pay | Admitting: Anesthesiology

## 2017-11-18 ENCOUNTER — Other Ambulatory Visit: Payer: Self-pay

## 2017-11-18 DIAGNOSIS — Z952 Presence of prosthetic heart valve: Secondary | ICD-10-CM

## 2017-11-18 DIAGNOSIS — D72829 Elevated white blood cell count, unspecified: Secondary | ICD-10-CM | POA: Diagnosis not present

## 2017-11-18 DIAGNOSIS — R112 Nausea with vomiting, unspecified: Secondary | ICD-10-CM | POA: Diagnosis present

## 2017-11-18 DIAGNOSIS — E785 Hyperlipidemia, unspecified: Secondary | ICD-10-CM | POA: Diagnosis not present

## 2017-11-18 DIAGNOSIS — E7800 Pure hypercholesterolemia, unspecified: Secondary | ICD-10-CM | POA: Diagnosis not present

## 2017-11-18 DIAGNOSIS — K2970 Gastritis, unspecified, without bleeding: Secondary | ICD-10-CM | POA: Diagnosis not present

## 2017-11-18 DIAGNOSIS — K210 Gastro-esophageal reflux disease with esophagitis: Secondary | ICD-10-CM | POA: Diagnosis not present

## 2017-11-18 DIAGNOSIS — E119 Type 2 diabetes mellitus without complications: Secondary | ICD-10-CM | POA: Diagnosis not present

## 2017-11-18 DIAGNOSIS — K254 Chronic or unspecified gastric ulcer with hemorrhage: Secondary | ICD-10-CM

## 2017-11-18 DIAGNOSIS — I4510 Unspecified right bundle-branch block: Secondary | ICD-10-CM | POA: Diagnosis not present

## 2017-11-18 DIAGNOSIS — Z01818 Encounter for other preprocedural examination: Secondary | ICD-10-CM

## 2017-11-18 DIAGNOSIS — K264 Chronic or unspecified duodenal ulcer with hemorrhage: Secondary | ICD-10-CM | POA: Diagnosis not present

## 2017-11-18 DIAGNOSIS — Z6835 Body mass index (BMI) 35.0-35.9, adult: Secondary | ICD-10-CM | POA: Diagnosis not present

## 2017-11-18 DIAGNOSIS — R197 Diarrhea, unspecified: Secondary | ICD-10-CM | POA: Diagnosis not present

## 2017-11-18 DIAGNOSIS — R195 Other fecal abnormalities: Secondary | ICD-10-CM | POA: Diagnosis not present

## 2017-11-18 DIAGNOSIS — K922 Gastrointestinal hemorrhage, unspecified: Secondary | ICD-10-CM | POA: Diagnosis present

## 2017-11-18 DIAGNOSIS — I5032 Chronic diastolic (congestive) heart failure: Secondary | ICD-10-CM | POA: Diagnosis not present

## 2017-11-18 DIAGNOSIS — D120 Benign neoplasm of cecum: Secondary | ICD-10-CM | POA: Diagnosis not present

## 2017-11-18 DIAGNOSIS — I110 Hypertensive heart disease with heart failure: Secondary | ICD-10-CM | POA: Diagnosis not present

## 2017-11-18 DIAGNOSIS — D649 Anemia, unspecified: Secondary | ICD-10-CM | POA: Diagnosis not present

## 2017-11-18 DIAGNOSIS — E669 Obesity, unspecified: Secondary | ICD-10-CM | POA: Diagnosis not present

## 2017-11-18 DIAGNOSIS — K449 Diaphragmatic hernia without obstruction or gangrene: Secondary | ICD-10-CM | POA: Diagnosis not present

## 2017-11-18 DIAGNOSIS — I2510 Atherosclerotic heart disease of native coronary artery without angina pectoris: Secondary | ICD-10-CM | POA: Diagnosis not present

## 2017-11-18 DIAGNOSIS — Z96651 Presence of right artificial knee joint: Secondary | ICD-10-CM | POA: Diagnosis not present

## 2017-11-18 DIAGNOSIS — D509 Iron deficiency anemia, unspecified: Secondary | ICD-10-CM | POA: Diagnosis not present

## 2017-11-18 HISTORY — PX: COLONOSCOPY WITH PROPOFOL: SHX5780

## 2017-11-18 HISTORY — PX: ESOPHAGOGASTRODUODENOSCOPY: SHX5428

## 2017-11-18 LAB — COMPREHENSIVE METABOLIC PANEL
ALBUMIN: 2.7 g/dL — AB (ref 3.5–5.0)
ALK PHOS: 47 U/L (ref 38–126)
ALT: 13 U/L — AB (ref 17–63)
AST: 23 U/L (ref 15–41)
Anion gap: 6 (ref 5–15)
BILIRUBIN TOTAL: 0.5 mg/dL (ref 0.3–1.2)
BUN: 27 mg/dL — ABNORMAL HIGH (ref 6–20)
CALCIUM: 8.8 mg/dL — AB (ref 8.9–10.3)
CO2: 24 mmol/L (ref 22–32)
CREATININE: 0.9 mg/dL (ref 0.61–1.24)
Chloride: 106 mmol/L (ref 101–111)
GFR calc non Af Amer: >60 mL/min (ref >60)
GLUCOSE: 129 mg/dL — AB (ref 65–99)
Potassium: 4.1 mmol/L (ref 3.5–5.1)
Sodium: 136 mmol/L (ref 135–145)
TOTAL PROTEIN: 5.5 g/dL — AB (ref 6.5–8.1)

## 2017-11-18 LAB — GLUCOSE, CAPILLARY
GLUCOSE-CAPILLARY: 124 mg/dL — AB (ref 65–99)
GLUCOSE-CAPILLARY: 156 mg/dL — AB (ref 65–99)
Glucose-Capillary: 103 mg/dL — ABNORMAL HIGH (ref 65–99)
Glucose-Capillary: 92 mg/dL (ref 65–99)

## 2017-11-18 LAB — CBC WITH DIFFERENTIAL/PLATELET
Basophils Absolute: 0 10*3/uL (ref 0.0–0.1)
Basophils Relative: 0 %
Eosinophils Absolute: 0.1 10*3/uL (ref 0.0–0.7)
Eosinophils Relative: 1 %
HEMATOCRIT: 26.6 % — AB (ref 39.0–52.0)
HEMOGLOBIN: 8.8 g/dL — AB (ref 13.0–17.0)
LYMPHS ABS: 1.7 10*3/uL (ref 0.7–4.0)
LYMPHS PCT: 17 %
MCH: 30.9 pg (ref 26.0–34.0)
MCHC: 33.1 g/dL (ref 30.0–36.0)
MCV: 93.3 fL (ref 78.0–100.0)
MONOS PCT: 10 %
Monocytes Absolute: 1 10*3/uL (ref 0.1–1.0)
NEUTROS ABS: 7.5 10*3/uL (ref 1.7–7.7)
NEUTROS PCT: 72 %
Platelets: 286 10*3/uL (ref 150–400)
RBC: 2.85 MIL/uL — AB (ref 4.22–5.81)
RDW: 14.2 % (ref 11.5–15.5)
WBC: 10.4 10*3/uL (ref 4.0–10.5)

## 2017-11-18 LAB — FOLATE RBC
FOLATE, RBC: 2082 ng/mL (ref >498)
Folate, Hemolysate: 618.5 ng/mL
HEMATOCRIT: 29.7 % — AB (ref 37.5–51.0)

## 2017-11-18 LAB — MAGNESIUM: Magnesium: 2 mg/dL (ref 1.7–2.4)

## 2017-11-18 LAB — PHOSPHORUS: Phosphorus: 2.7 mg/dL (ref 2.5–4.6)

## 2017-11-18 SURGERY — EGD (ESOPHAGOGASTRODUODENOSCOPY)
Anesthesia: Monitor Anesthesia Care

## 2017-11-18 MED ORDER — LACTATED RINGERS IV SOLN: INTRAVENOUS | Status: DC | PRN

## 2017-11-18 MED ORDER — PROPOFOL 500 MG/50ML IV EMUL
INTRAVENOUS | Status: DC | PRN
  Administered 2017-11-18: 125 ug/kg/min via INTRAVENOUS

## 2017-11-18 MED ORDER — SODIUM CHLORIDE 0.9 % IV SOLN
1000.0000 mL | INTRAVENOUS | Status: DC
  Administered 2017-11-18: 1000 mL via INTRAVENOUS

## 2017-11-18 MED ORDER — SODIUM CHLORIDE 0.9 % IV SOLN: INTRAVENOUS | Status: AC

## 2017-11-18 MED ORDER — PANTOPRAZOLE SODIUM 40 MG PO TBEC
40.0000 mg | DELAYED_RELEASE_TABLET | Freq: Two times a day (BID) | ORAL | Status: DC
  Administered 2017-11-18 – 2017-11-19 (×3): 40 mg via ORAL
  Filled 2017-11-18 (×4): qty 1

## 2017-11-18 MED ORDER — PROPOFOL 10 MG/ML IV BOLUS
INTRAVENOUS | Status: DC | PRN
  Administered 2017-11-18: 20 mg via INTRAVENOUS

## 2017-11-18 MED ORDER — ONDANSETRON HCL 4 MG/2ML IJ SOLN
INTRAMUSCULAR | Status: DC | PRN
  Administered 2017-11-18: 4 mg via INTRAVENOUS

## 2017-11-18 MED ORDER — PROPOFOL 10 MG/ML IV BOLUS
INTRAVENOUS | Status: AC
  Filled 2017-11-18: qty 20

## 2017-11-18 MED ORDER — LIDOCAINE 2% (20 MG/ML) 5 ML SYRINGE
INTRAMUSCULAR | Status: DC | PRN
  Administered 2017-11-18: 75 mg via INTRAVENOUS

## 2017-11-18 SURGICAL SUPPLY — 22 items

## 2017-11-18 NOTE — Consult Note (Addendum)
Cardiology Consultation:   Patient ID: Todd Romero; [id number]; 03/24/1934   Admit date: 11/16/2017 Date of Consult: 11/18/2017  Primary Care Provider: [name], MD Primary Cardiologist: Sinclair Grooms, MD  Primary Electrophysiologist:     Patient Profile:   Todd Romero is a 84 y.o. male with a hx of aortic stenosis s/p TAVR (03/2017), chronic diastolic heart failure, CAD with CTO of D1 and 90% LCX by cath in 2013, OSA on CPAP, HTN, DM2, and HLD who is being seen today for the evaluation of preoperative cardiac risk assessment at the request of Dr. Alfredia Ferguson.  History of Present Illness:   Todd Romero is known to this service and last saw Dr. Tamala Julian in clinic on 10/27/17. At that time, he was doing well and was continued on plavix for 12 months following TAVR. At that visit, Dr. Tamala Julian cleared him for elective right partial knee replacement with instructions to hold plavix 5 days prior to procedure.  He underwent knee surgery on 11/08/17. He was discharged on 11/11/17. Prior to discharge, he was found to have a UTI and was prescribed bactrim.  He presented back to [facility] with complaints of nausea, vomiting, diarrhea, and medication reaction to the bactrim. He states that he was getting weaker with nausea and occasional vomiting. Family stopped his bactrim and all pain medications. In the ER, he was found to have a decreased Hb to 9.6 (12.5 1 week prior). He was also heme positive, but he is also on iron.  Cardiology was consulted by GI for cardiac clearance for colonoscopy. He was found to have a mild and flat troponin elevation on arrival. Cardiology is also asked to provide recommendations regarding ASA/plavix for TAVR less than 12 months prior by IM.   On my interview, he denies chest pain. He has not eaten much in 5 days and reports feeling weak. He denies shortness of breath, orthopnea, and palpitations, but does report swelling in his lower extremities. Lasix has been held  during this hospitalization.  EKG on arrival without acute changes.    Past Medical History:  Diagnosis Date  . Anemia   . Arthritis   . CAD (coronary artery disease)    a. cardiac cath 12/2016 showing moderate AS, elevated LVEDP, heavy 3V coronary calcification, 100% mD2, 30-50% LAD, 50-90% stenosis of Cx proximal to origin of L-PDA, RCA not engaged due to poor catheter control (difficult procedure) - coronary status was essentially unchanged from prior.  . Chronic diastolic CHF (congestive heart failure) ([place])   . Degenerative arthritis   . Diabetes ([place])   . Dyspnea   . Essential hypertension   . Hyperlipidemia   . Obesity (BMI 30-39.9) 06/18/2015  . OSA (obstructive sleep apnea)    Severe with AHI 27/hr now on CPAP  no cpap  . Peritonitis ([place]) 1985?  [name] Pneumonia    "6 months - 84 years old"  . Pure hypercholesterolemia   . RBBB   . [facility] spotted fever   . 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 Severe aortic stenosis    a. s/p TAVR 03/2017.  . Type II or unspecified type diabetes mellitus without mention of complication, not stated as uncontrolled     Past Surgical History:  Procedure Laterality Date  . APPENDECTOMY    . CARPAL TUNNEL RELEASE    . COLECTOMY     partial  .  EYE SURGERY Bilateral    cataracts removed, lens placed  . JOINT REPLACEMENT    . KNEE CARTILAGE SURGERY    . PARTIAL COLECTOMY    . RIGHT/LEFT HEART CATH AND CORONARY ANGIOGRAPHY N/A 12/28/2016   Procedure: Right/Left Heart Cath and Coronary Angiography;  Surgeon: Belva Crome, MD;  Location: [place] [facility];  Service: Cardiovascular;  Laterality: N/A;  . TEE WITHOUT CARDIOVERSION N/A 03/15/2017   Procedure: TRANSESOPHAGEAL ECHOCARDIOGRAM (TEE);  Surgeon: Burnell Blanks, MD;  Location: [place];  Service: Open Heart Surgery;  Laterality: N/A;  . TOTAL KNEE  ARTHROPLASTY Right 11/08/2017  . TOTAL KNEE ARTHROPLASTY Right 11/08/2017   Procedure: TOTAL KNEE ARTHROPLASTY;  Surgeon: Melrose Nakayama, MD;  Location: [place];  Service: Orthopedics;  Laterality: Right;  . TRANSCATHETER AORTIC VALVE REPLACEMENT, TRANSFEMORAL N/A 03/15/2017   Procedure: TRANSCATHETER AORTIC VALVE REPLACEMENT, TRANSFEMORAL;  Surgeon: Burnell Blanks, MD;  Location: [place];  Service: Open Heart Surgery;  Laterality: N/A;  . VEIN SURGERY       Home Medications:  Prior to Admission medications   Medication Sig Start Date End Date Taking? Authorizing Provider  ACCU-CHEK AVIVA PLUS test strip USE AS INSTRUCTED TO CHECK BLOOD SUGAR TWICE A DAY 12/24/16  Yes [name], MD  acetaminophen (TYLENOL) 500 MG tablet Take 500 mg by mouth every 4 (four) hours as needed for moderate pain or headache.   Yes [provider]  amLODipine (NORVASC) 2.5 MG tablet Take 2.5 mg by mouth daily.   Yes [provider]  aspirin EC 325 MG EC tablet Take 1 tablet (325 mg total) by mouth 2 (two) times daily after a meal. 11/11/17  Yes Macie Burows  aspirin EC 81 MG tablet Take 81 mg by mouth daily.   Yes [provider]  B-D INS SYR ULTRAFINE 1CC/31G 31G X 5/16" 1 ML MISC USE TO INJECT INSULIN DAILY 12/06/16  Yes [name], MD  bisacodyl (DULCOLAX) 5 MG EC tablet Take 1 tablet (5 mg total) by mouth daily as needed for moderate constipation. 11/11/17  Yes Loni Dolly, PA-C  clopidogrel (PLAVIX) 75 MG tablet Take 1 tablet (75 mg total) by mouth daily with breakfast. 10/05/17  Yes Belva Crome, MD  docusate sodium (COLACE) 100 MG capsule Take 1 capsule (100 mg total) by mouth 2 (two) times daily. 11/11/17  Yes Loni Dolly, PA-C  ferrous sulfate 325 (65 FE) MG tablet Take 325 mg by mouth daily with breakfast.   Yes [provider]  fish oil-omega-3 fatty acids 1000 MG capsule Take 1 g by mouth daily at 12 noon.    Yes [provider]  furosemide (LASIX) 20 MG  tablet Take 1 tablet (20 mg total) by mouth daily. 04/20/17 11/16/17 Yes Burnell Blanks, MD  glimepiride (AMARYL) 2 MG tablet Take 0.5 tablets (1 mg total) by mouth daily. 05/06/17  Yes [name], MD  HYDROcodone-acetaminophen (NORCO) 7.5-325 MG tablet Take 1-2 tablets by mouth every 4 (four) hours as needed for severe pain. 11/11/17  Yes Loni Dolly, PA-C  insulin lispro (HUMALOG) 100 UNIT/ML injection Inject 4 to 10 units into the skin three (3) times daily with meals as directed.   Yes [provider]  insulin NPH Human (HUMULIN N,NOVOLIN N) 100 UNIT/ML injection Inject 0.26 mLs (26 Units total) into the skin at bedtime. Uses Vial 08/19/17  Yes [name], MD  Insulin Pen Needle (B-D UF III MINI PEN NEEDLES) 31G X 5 MM MISC USE 2  PEN NEEDLE PER DAY WITH HUMALOG AND VICTOZA 01/14/17  Yes [name], MD  liraglutide 18 MG/3ML SOPN Inject 0.3 mLs (1.8 mg total) into the skin at bedtime. 02/14/17  Yes [name], MD  metFORMIN (GLUMETZA) 500 MG (MOD) 24 hr tablet Take 1 tablet (500 mg total) by mouth 2 (two) times daily with a meal. (morning & noon) 05/06/17  Yes [name], MD  Multiple Vitamin (MULTIVITAMIN WITH MINERALS) TABS tablet Take 1 tablet by mouth daily.   Yes [provider]  Multiple Vitamins-Minerals (PRESERVISION AREDS 2 PO) Take 1 capsule by mouth 2 (two) times daily.   Yes [provider]  olmesartan (BENICAR) 40 MG tablet Take 1 tablet (40 mg total) by mouth daily. 01/03/17  Yes [name], MD  pravastatin (PRAVACHOL) 80 MG tablet Take 1 tablet (80 mg total) by mouth every evening. 09/19/17  Yes [name], MD  tamsulosin [facility]) 0.4 MG CAPS capsule Take 1 capsule daily 09/19/17  Yes [name], MD  tiZANidine (ZANAFLEX) 4 MG tablet Take 1 tablet (4 mg total) by mouth every 6 (six) hours as needed. 11/11/17 11/11/18 Yes Loni Dolly, PA-C  vitamin C (ASCORBIC ACID) 500 MG tablet Take 500 mg by mouth daily.   Yes [provider]  promethazine  (PHENERGAN) 12.5 MG tablet Take 12.5 mg by mouth every 6 (six) hours as needed for nausea/vomiting. 11/14/17   [provider]    Inpatient Medications: Scheduled Meds: . amLODipine  2.5 mg Oral Daily  . aspirin EC  81 mg Oral Daily  . clopidogrel  75 mg Oral Q breakfast  . feeding supplement  1 Container Oral TID BM  . furosemide  20 mg Oral Daily  . insulin aspart  0-5 Units Subcutaneous QHS  . insulin aspart  0-9 Units Subcutaneous TID WC  . insulin NPH Human  26 Units Subcutaneous QHS  . irbesartan  150 mg Oral Daily  . ondansetron  4 mg Intravenous Once  . pravastatin  80 mg Oral QPM  . tamsulosin  0.4 mg Oral QPC supper   Continuous Infusions: . sodium chloride 1,000 mL (11/17/17 1206)  . sodium chloride 20 mL/hr at 11/17/17 2337   PRN Meds: acetaminophen **OR** acetaminophen, HYDROcodone-acetaminophen, ondansetron (ZOFRAN) IV, tiZANidine  Allergies:    Allergies  Allergen Reactions  . Bactrim [Sulfamethoxazole-Trimethoprim] Nausea And Vomiting  . Morphine And Related Nausea And Vomiting    Social History:   Social History   Socioeconomic History  . Marital status: Married    Spouse name: Not on file  . Number of children: 4  . Years of education: Not on file  . Highest education level: Not on file  Social Needs  . Financial resource strain: Not on file  . Food insecurity - worry: Not on file  . Food insecurity - inability: Not on file  . Transportation needs - medical: Not on file  . Transportation needs - non-medical: Not on file  Occupational History  . Occupation: Construction-Retired  Tobacco Use  . Smoking status: Never Smoker  . Smokeless tobacco: Never Used  Substance and Sexual Activity  . Alcohol use: No  . Drug use: No  . Sexual activity: Not on file  Other Topics Concern  . Not on file  Social History Narrative  . Not on file    Family History:    Family History  Problem Relation Age of Onset  . Heart failure Father   .  Emphysema Father   . Hypertension Mother   .  Cancer Sister   . Healthy Sister      ROS:  Please see the history of present illness.   All other ROS reviewed and negative.     Physical Exam/Data:   Vitals:   11/17/17 0507 11/17/17 1343 11/17/17 2131 11/18/17 0600  BP: (!) 134/58 (!) 108/47 (!) 135/54 (!) 151/63  Pulse: 64 (!) 56 62 64  Resp: 20 18 (!) 22 20  Temp: 99.5 F (37.5 C) 98.1 F (36.7 C) 98.7 F (37.1 C) 98 F (36.7 C)  TempSrc: Oral Oral Oral Oral  SpO2: 97% 97% 97% 97%  Weight: 236 lb 12.4 oz (107.4 kg)   236 lb 12.4 oz (107.4 kg)  Height:       No intake or output data in the 24 hours ending 11/18/17 0754 Filed Weights   11/16/17 2246 11/17/17 0507 11/18/17 0600  Weight: 234 lb 12.6 oz (106.5 kg) 236 lb 12.4 oz (107.4 kg) 236 lb 12.4 oz (107.4 kg)   Body mass index is 36 kg/m.  General:  Well nourished, well developed, in no acute distress HEENT: normal Neck: no JVD Vascular: No carotid bruits Cardiac:  normal S1, S2; RRR; no murmur Lungs:  clear to auscultation bilaterally, no wheezing, rhonchi or rales  Abd: soft, nontender, no hepatomegaly  Ext: 1+ LE edema Musculoskeletal:  No deformities, BUE and BLE strength normal and equal Skin: warm and dry  Neuro:  CNs 2-12 intact, no focal abnormalities noted Psych:  Normal affect   EKG:  The EKG was personally reviewed and demonstrates:  Sinus with RBBB (old) Telemetry:  Telemetry was personally reviewed and demonstrates:  Sinus with PVCs  Relevant CV Studies:  Echo 04/20/17: Study Conclusions - Left ventricle: Wall thickness was increased in a pattern of mild   LVH. Systolic function was normal. The estimated ejection   fraction was in the range of 55% to 60%. Wall motion was normal;   there were no regional wall motion abnormalities. Features are   consistent with a pseudonormal left ventricular filling pattern,   with concomitant abnormal relaxation and increased filling   pressure (grade 2  diastolic dysfunction). - Aortic valve: A bioprosthesis was present. Valve area (VTI): 1.9   cm^2. Valve area (Vmax): 1.86 cm^2. Valve area (Vmean): 1.71   cm^2. - Mitral valve: Mildly calcified annulus. - Left atrium: The atrium was moderately dilated. - Right atrium: The atrium was mildly dilated.   Laboratory Data:  Chemistry Recent Labs  Lab 11/16/17 1646 11/17/17 0418 11/18/17 0433  NA 135 135 136  K 4.4 4.3 4.1  CL 101 103 106  CO2 24 23 24   GLUCOSE 123* 117* 129*  BUN 27* 27* 27*  CREATININE 0.93 0.91 0.90  CALCIUM 9.3 8.8* 8.8*  GFRNONAA >60 >60 >60  GFRAA >60 >60 >60  ANIONGAP 10 9 6     Recent Labs  Lab 11/16/17 1646 11/17/17 0418 11/18/17 0433  PROT 5.8* 5.3* 5.5*  ALBUMIN 2.8* 2.6* 2.7*  AST 20 19 23   ALT 13* 13* 13*  ALKPHOS 52 44 47  BILITOT 0.7 0.7 0.5   Hematology Recent Labs  Lab 11/16/17 1646 11/17/17 0418 11/18/17 0433  WBC 14.0* 12.1* 10.4  RBC 3.16* 2.90* 2.85*  HGB 9.6* 8.9* 8.8*  HCT 28.7* 26.7* 26.6*  MCV 90.8 92.1 93.3  MCH 30.4 30.7 30.9  MCHC 33.4 33.3 33.1  RDW 13.9 14.1 14.2  PLT 282 278 286   Cardiac Enzymes Recent Labs  Lab 11/16/17 2246 11/17/17 0418 11/17/17  1027  TROPONINI 0.06* 0.06* 0.05*   No results for input(s): TROPIPOC in the last 168 hours.  BNPNo results for input(s): BNP, PROBNP in the last 168 hours.  DDimer No results for input(s): DDIMER in the last 168 hours.  Radiology/Studies:  Dg Chest Portable 1 View  Result Date: 11/16/2017 CLINICAL DATA:  84 year old male with cough and weakness. EXAM: PORTABLE CHEST 1 VIEW COMPARISON:  Chest radiograph dated 11/01/2017 FINDINGS: There is no focal consolidation, pleural effusion, or pneumothorax. Stable cardiomegaly. Aortic valve replacement. Atherosclerotic calcification of the aortic arch. No acute osseous pathology. IMPRESSION: No active disease. Electronically Signed   By: Anner Crete M.D.   On: 11/16/2017 19:46    Assessment and Plan:    Preoperative cardiac risk assessment This patient has a history of CAD, chronic diastolic heart failure, and is on insulin for DM. He has a mildly elevated but flat troponin value (0.06 --> 0.06 --> 0.05), which is inconsistent with ACS. The patient also denies chest pain, shortness of breath, palpitations, dizziness, and feelings of pre-syncope. EKG is not grossly changed from prior. Will repeat EKG today to make sure there are no acute changes.  According to the RCRI, his medical history and overall clinic picture confer a 15% risk of death, MI, or cardiac arrest during the perioperative period. However, colonoscopy is a low risk procedure. If EKG is not changed, we do not recommend any further ischemic evaluation prior to EGD/colonoscopy.   Aortic stenosis s/p TAVR 03/2017 He was recently cleared for knee surgery with instructions to hold plavix 5 days prior to procedure. He was restarted on ASA/plavix at discharge. In consultation with Dr. Angelena Form, [place] to hold plavix during GI/anemia workup. Resume ASA and plavix when safe to do so from a GI standpoint.   Anemia Hb is down to 8.8 from 12.5 on 11/01/17. ASA/plavix as above. He has been taking iron.   Chronic diastolic heart failure Recommending restarting home lasix when he resumes a diet. He is not having respiratory distress, but does have lower extremity swelling.   For questions or updates, please contact Guadalupe Guerra Please consult [email or web address] for contact info under Cardiology/STEMI.   Signed, Ledora Bottcher, [place]  11/18/2017 7:54 AM   Agree with note by Sunday Spillers  We are asked to clear patient for endoscopy because of history of TA VR. He has anemia of unclear etiology. He has already had his endoscopy. Okay to restart aspirin and Plavix from our point of view when cleared by GI for this. No further evaluation by [place] is necessary at this time. Follow-up with Dr. Twanna Hy, M.D., [place], [facility], [place]  [place] [address] [place], [place]  [address]  [phone] 11/18/2017 3:10 PM

## 2017-11-18 NOTE — Care Management Note (Signed)
Case Management Note  Patient Details  Name: Todd Romero MRN: [id number] Date of Birth: Mar 05, 1934  Subjective/Objective:    GIB                Action/Plan: Pt plan to discharge home with [facility].    Expected Discharge Date:  (unknown)               Expected Discharge Plan:  [address]  In-House Referral:     Discharge planning Services  CM Consult  Post Acute Care Choice:    Choice offered to:  Patient, Spouse  DME Arranged:    DME Agency:     HH Arranged:  PT, RN [place] Agency:  [place]  Status of Service:     If discussed at [place] of Stay Meetings, dates discussed:    Additional CommentsPurcell Mouton, RN 11/18/2017, 3:22 PM

## 2017-11-18 NOTE — Op Note (Signed)
[facility] Patient Name: Todd Romero Procedure Date: 11/18/2017 MRN: [id number] Attending MD: Carol Ada , MD Date of Birth: May 17, 1934 CSN: [id number] Age: 84 Admit Type: Outpatient Procedure:                Colonoscopy Indications:              Heme positive stool, Iron deficiency anemia Providers:                Carol Ada, MD, Cleda Daub, RN, Charolette Child,                            Technician, Arnoldo Hooker, CRNA Referring MD:              Medicines:                Propofol per Anesthesia Complications:            No immediate complications. Estimated Blood Loss:     Estimated blood loss: none. Procedure:                Pre-Anesthesia Assessment:                           - Prior to the procedure, a History and Physical                            was performed, and patient medications and                            allergies were reviewed. The patient's tolerance of                            previous anesthesia was also reviewed. The risks                            and benefits of the procedure and the sedation                            options and risks were discussed with the patient.                            All questions were answered, and informed consent                            was obtained. Prior Anticoagulants: The patient has                            taken Plavix (clopidogrel), last dose was 2 days                            prior to procedure. ASA Grade Assessment: III - A                            patient with severe systemic disease. After  reviewing the risks and benefits, the patient was                            deemed in satisfactory condition to undergo the                            procedure.                           - Sedation was administered by an anesthesia                            professional. Deep sedation was attained.                           After obtaining informed consent, the  colonoscope                            was passed under direct vision. Throughout the                            procedure, the patient's blood pressure, pulse, and                            oxygen saturations were monitored continuously. The                            EC-[id number] ([id number]) scope was introduced through                            the anus and advanced to the the terminal ileum.                            The colonoscopy was performed without difficulty.                            The patient tolerated the procedure well. The                            quality of the bowel preparation was adequate to                            identify polyps 6 mm and larger in size. The                            terminal ileum and the rectum were photographed. Scope In: 62:70:35 AM Scope Out: 11:27:31 AM Scope Withdrawal Time: 0 hours 15 minutes 2 seconds  Total Procedure Duration: 0 hours 20 minutes 32 seconds  Findings:      There was evidence of a prior functional end-to-end ileo-colonic       anastomosis in the ascending colon. This was patent and was       characterized by healthy appearing mucosa. The anastomosis was traversed.      The patient underwent a cecectomy with Dr. Dalbert Batman 09/15/2007. At that  time       the patient was under the care of Dr. Lajoyce Corners and polyps were identified. 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       The prep was not optimal in the proximal colon, but an adequate view was       identified. Impression:               - Patent functional end-to-end ileo-colonic                            anastomosis, characterized by healthy appearing                            mucosa.                           - No specimens collected. Moderate Sedation:      N/A- Per Anesthesia Care Recommendation:           - Return patient to hospital ward for ongoing care.                           - Resume regular diet.                           -  Continue present medications.                           - Repeat colonoscopy is not recommended for                            surveillance. Procedure Code(s):        --- Professional ---                           [phone], Colonoscopy, flexible; diagnostic, including                            collection of specimen(s) by brushing or washing,                            when performed (separate procedure) Diagnosis Code(s):        --- Professional ---                           R19.5, Other fecal abnormalities                           Z98.0, Intestinal bypass and anastomosis status                           D50.9, Iron deficiency anemia, unspecified CPT copyright 2016 American Medical Association. All rights reserved. The codes documented in this report are preliminary and upon coder review may  be revised to meet current compliance requirements. Carol Ada, MD Carol Ada, MD 11/18/2017 11:37:39 AM This report has been signed electronically. Number of Addenda: 0

## 2017-11-18 NOTE — Progress Notes (Signed)
PROGRESS NOTE    Todd Romero  ZWC:[id number] DOB: October 27, 1933 DOA: 11/16/2017 PCP: [name], MD   Brief Narrative: Todd Romero  is a 84 y.o. male, w Hypertension, CAD, CHF (diastolic), AS s/p TAVR,  Dm2, s/p recent  apparently presents w c/o n/v, diarrhea for the past 3 days.  Pt attributed this to Bactrim and stopped.  Pt was feeling increasing weak and therefore presented to ED. He was admitted for Nausea/Vomiting/Diarrhea and Symptomatic Anemia. Gastroenterology Dr. Benson Norway was consulted and will evaluate the patient. Plan is for EGD/Colonoscopy but Dr. Benson Norway requesting Cardiology to evaluate to clear the patient. Cardiology consulted for further evaluation and Cardiac Clearance. Cardiology cleared the patient and patient underwent EGD and colonoscopy this AM.   Assessment & Plan:   Principal Problem:   Anemia Active Problems:   Heme positive stool   Leukocytosis   GIB (gastrointestinal bleeding)  Intractable Nausea and Vomiting, Improving -Admitted to Telemetry -? Medication induced after taking Bactrim -C/w Antiemetics with Zofran 4 mg q6hprn N/V -C/w IVF with NS at 75 mL/hr -C/w Regular Diet if patient can tolerate it.  Symptomatic Normocytic Anemia 2/2 to Esophagitis, Gastritis, Gastric and Duodenal Ulcers -Patient now Regular Diet after EGD  -FOBT was Positive; Patient was taking ASA 325 mg po BID after Knee Arthroscopy and is on Plavix 75 mg po Daily after TAVR;  -HELD ASA 81 mg po Daily and Plavix 75 mg  -Gastroenterology Dr. Benson Norway consulted and appreciate further evaluation and recommendations and recommended EGD and Colonoscopy; See below -Anemia Panel showed Iron level of 23, UIBC of 180, TIBC of 203, Saturation Ratios of 11, Ferritin of 234, and Vitamin B12 of 732 -Hold patient's Ferrous Sulfate 325 mg po daily  -Hb/Hct went from 9.6/28.7 -> 8.9/26.7 -> 8.8/26.6 but prior to TKA patient's Hb was 12.5 -Continue to Monitor S/Sx of Bleeding -Repeat CBC in AM    -Get PT/OT Evaluation   Upper GIB -Patient stated had some Melena/Dark Stools -EGD done and showed patient had LA Grade D Reflux Esophagitis, 4 cm hiatal hernia, Non-bleeding gastric ulcer with a clean ulcer base, Gastritis that was biopsied, and Multiple (at least 3 cratered non bleeding duodenal Ulceers -GI Advanced Diet to Regular Diet and recommending PPI BID indefinitely -Follow up with Pathology results as an outpatient -Colonoscopy showed Patent functional end-to-end ileocolonic anastomosis   Leukocytosis, improved -Likely Reactive to the Nausea and Vomiting -WBC went from 14.0 -> 12.3 -> 10.4 -Continue to Monitor for S/Sx of Infection -Repeat CBC in AM   Diarrhea, improved -Resolved and currently not having any  -Feel like it was Abx induced -GI Pathogen Panel and C Difficile were mentioned in H and P but don't see them ordered -Will not order them as patient has no signs of Diarrhea  Diabetes Mellitus Type 2  -Hold Home Metformin 500 mg po BID, and Liraglutide 1.8 mg qHS -Restarted Home Insulin NPH -Started on Sensitive Novolog SSI -Continue to Monitor CBG's; CBG's ranging from 92-124  Elevated Troponin -Relatively Flat and went from 0.06 -> 0.06 -> 0.05 -Denies any Chest Pain -Cardiology Consulted at request of Dr. Benson Norway and Cleared Patient for EGD   Severe Aortic Stenosis s/p TAVR -Held ASA 81 mg and Clopidogrel 75 mg po Daily for now; Per Cardiology  -Per Cardiology ok to hold ASA and Plavix and ok to resume when GI feels ok -After Discussing with GI, Dr. Benson Norway feels like patient can go back on ASA/Plavix on 11/21/17   CAD  -Held  ASA 81 mg po Daily, Clopidogrel 75 mg po Daily for now, -C/w Irbesartan substitution of Olmesartan 40 mg po Daily   Essential HTN -C/w Irbesartan 150 mg po Daily substitution of Olmesartan 40 mg po Daily -C/w Amlodipine 2.5 mg po daily    HLD/Hypercholestrolemia -C/w Pravastatin 80 mg po qHS  Obesity  -Weight Loss Counseling  given   Chronic Diastolic CHF  -Currently not decompensated -C/w Irbesartan 150 mg po Daily and Furosemide 20 mg po Daily   Recent Total Right Knee Arthroplasty -Was on ASA 325 mg po BID by Ortho Dr. Rhona Raider;  -I called [name] and spoke to Ortho PA [name] who recommended stopping ASA 325 mg po BID and just resuming ASA 81 mg po daily and Clopidogrel 75 mg po Daily. Will do so likely Monday 11/21/17 -Pain Control with Hydrocodone-Acetaminophen 7.5-325 1-2 tablets q4hprn  DVT prophylaxis: SCD's  Code Status: FULL CODE Family Communication: Discussed with Wife at Bedside Disposition Plan: Remain inpatient for further workup   Consultants:   Gastroenterology Dr. Benson Norway  Case was discussed with [place] PA [name]  Cardiology    Procedures:  EGD Findings:      LA Grade D (one or more mucosal breaks involving at least 75% of       esophageal circumference) esophagitis with no bleeding was found.      A 4 cm hiatal hernia was present.      One non-bleeding cratered gastric ulcer with a clean ulcer base (Forrest       Class III) was found in the gastric antrum. The lesion was 10 mm in       largest dimension.      Segmental moderate inflammation characterized by erosions and erythema       was found in the gastric body. Biopsies were taken with a cold forceps       for histology.      Three non-bleeding cratered duodenal ulcers with a clean ulcer base       (Forrest Class III) were found in the duodenal bulb and in the second       portion of the duodenum. The largest lesion was 20 mm in largest       dimension. Impression:               - LA Grade D reflux esophagitis.                           - 4 cm hiatal hernia.                           - Non-bleeding gastric ulcer with a clean ulcer                            base (Forrest Class III).                           - Gastritis. Biopsied.                           - Multiple non-bleeding duodenal  ulcers with a                            clean ulcer base (Forrest Class III).  COLONOSCOPY Findings:      There was evidence of a prior functional end-to-end ileo-colonic       anastomosis in the ascending colon. This was patent and was       characterized by healthy appearing mucosa. The anastomosis was traversed.      The patient underwent a cecectomy with Dr. Dalbert Batman 09/15/2007. At that time       the patient was under the care of Dr. Lajoyce Corners and polyps were identified. 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       The prep was not optimal in the proximal colon, but an adequate view was       identified. Impression:               - Patent functional end-to-end ileo-colonic                            anastomosis, characterized by healthy appearing                            mucosa.                           - No specimens collected.       Antimicrobials:  Anti-infectives (From admission, onward)   None     Subjective: Seen and examined and wife was concerned because she felt that he was weaker. No CP or SOB. States he had dark stool all of last night after drinking the prep.  Objective: Vitals:   11/18/17 1133 11/18/17 1142 11/18/17 1150 11/18/17 1330  BP: (!) 97/46 (!) 131/35 (!) 158/61 (!) 163/67  Pulse: 62 60 (!) 57 60  Resp: 16 (!) 22 20 18   Temp:    97.9 F (36.6 C)  TempSrc:    Oral  SpO2: 100% 100% 98% 99%  Weight:      Height:        Intake/Output Summary (Last 24 hours) at 11/18/2017 1941 Last data filed at 11/18/2017 1554 Gross per 24 hour  Intake 640 ml  Output -  Net 640 ml   Filed Weights   11/17/17 0507 11/18/17 0600 11/18/17 1033  Weight: 107.4 kg (236 lb 12.4 oz) 107.4 kg (236 lb 12.4 oz) 107 kg (236 lb)   Examination: Physical Exam:  Constitutional: WN/WD obese Caucasian male in NAD appears calm and awaiting EGD/Colon Eyes: Sclerae anicteric. Lids normal ENMT: External Ears and nose appear normal Neck:  Supple with no JVD Respiratory: Diminished to auscultation bilaterally; No appreciable wheezing/rales/rhonchi; Unlabored breathing Cardiovascular: RRR; Has a Systolic Murmur; 1+ LE edema GU: Deferred Musculoskeletal: No contractures; No cyanosis Skin: Warm and Dry; No appreciable rashes or lesions on a limited skin evaluation; Right Knee Incision coverred Neurologic: CN 2-12 grossly intact. No appreciable focal deficits Psychiatric: Normal mood and affect. Intact judgement and insight  Data Reviewed: I have personally reviewed following labs and imaging studies  CBC: Recent Labs  Lab 11/16/17 1646 11/16/17 2246 11/17/17 0418 11/18/17 0433  WBC 14.0*  --  12.1* 10.4  NEUTROABS  --   --   --  7.5  HGB 9.6*  --  8.9* 8.8*  HCT 28.7* 29.7* 26.7* 26.6*  MCV 90.8  --  92.1 93.3  PLT 282  --  278 341   Basic Metabolic Panel:  Recent Labs  Lab 11/16/17 1646 11/17/17 0418 11/18/17 0433  NA 135 135 136  K 4.4 4.3 4.1  CL 101 103 106  CO2 24 23 24   GLUCOSE 123* 117* 129*  BUN 27* 27* 27*  CREATININE 0.93 0.91 0.90  CALCIUM 9.3 8.8* 8.8*  MG  --   --  2.0  PHOS  --   --  2.7   GFR: Estimated Creatinine Clearance: 76.3 mL/min (by C-G formula based on SCr of 0.9 mg/dL). Liver Function Tests: Recent Labs  Lab 11/16/17 1646 11/17/17 0418 11/18/17 0433  AST 20 19 23   ALT 13* 13* 13*  ALKPHOS 52 44 47  BILITOT 0.7 0.7 0.5  PROT 5.8* 5.3* 5.5*  ALBUMIN 2.8* 2.6* 2.7*   Recent Labs  Lab 11/16/17 1646  LIPASE 35   No results for input(s): AMMONIA in the last 168 hours. Coagulation Profile: No results for input(s): INR, PROTIME in the last 168 hours. Cardiac Enzymes: Recent Labs  Lab 11/16/17 2246 11/17/17 0418 11/17/17 1027  TROPONINI 0.06* 0.06* 0.05*   BNP (last 3 results) No results for input(s): PROBNP in the last 8760 hours. HbA1C: No results for input(s): HGBA1C in the last 72 hours. CBG: Recent Labs  Lab 11/17/17 1717 11/17/17 2148 11/18/17 0848  11/18/17 1215 11/18/17 1700  GLUCAP 115* 240* 103* 92 124*   Lipid Profile: No results for input(s): CHOL, HDL, LDLCALC, TRIG, CHOLHDL, LDLDIRECT in the last 72 hours. Thyroid Function Tests: No results for input(s): TSH, T4TOTAL, FREET4, T3FREE, THYROIDAB in the last 72 hours. Anemia Panel: Recent Labs    11/16/17 2246  VITAMINB12 732  FERRITIN 234  TIBC 203*  IRON 23*   Sepsis Labs: No results for input(s): PROCALCITON, LATICACIDVEN in the last 168 hours.  No results found for this or any previous visit (from the past 240 hour(s)).   Radiology Studies: No results found. Scheduled Meds: . amLODipine  2.5 mg Oral Daily  . feeding supplement  1 Container Oral TID BM  . furosemide  20 mg Oral Daily  . insulin aspart  0-5 Units Subcutaneous QHS  . insulin aspart  0-9 Units Subcutaneous TID WC  . insulin NPH Human  26 Units Subcutaneous QHS  . irbesartan  150 mg Oral Daily  . ondansetron  4 mg Intravenous Once  . pantoprazole  40 mg Oral BID  . pravastatin  80 mg Oral QPM  . tamsulosin  0.4 mg Oral QPC supper   Continuous Infusions: . sodium chloride      LOS: 0 days   Kerney Elbe, DO [facility] Pager [phone]  If 7PM-7AM, please contact night-coverage [email or web address] Password [facility] 11/18/2017, 7:41 PM

## 2017-11-18 NOTE — Progress Notes (Addendum)
Spoke with pt concerning discharge plan, noted food tray, pt is not eating. Pt states that he became nauseated. Plan for discharge is home with wife and [facility] for Home PT. [facility] in house rep was called and will follow for pt's discharge.

## 2017-11-18 NOTE — Care Management Obs Status (Signed)
Start NOTIFICATION   Patient Details  Name: Todd Romero MRN: [id number] Date of Birth: 10-05-1933   Medicare Observation Status Notification Given:  Yes    Purcell Mouton, RN 11/18/2017, 4:15 PM

## 2017-11-18 NOTE — Interval H&P Note (Signed)
History and Physical Interval Note:  11/18/2017 10:42 AM  Todd Romero  has presented today for surgery, with the diagnosis of Anemia and diarrhea  The various methods of treatment have been discussed with the patient and family. After consideration of risks, benefits and other options for treatment, the patient has consented to  Procedure(s): ESOPHAGOGASTRODUODENOSCOPY (EGD) (N/A) COLONOSCOPY WITH PROPOFOL (N/A) as a surgical intervention .  The patient's history has been reviewed, patient examined, no change in status, stable for surgery.  I have reviewed the patient's chart and labs.  Questions were answered to the patient's satisfaction.     Gerrald Basu D

## 2017-11-18 NOTE — Transfer of Care (Signed)
Immediate Anesthesia Transfer of Care Note  Patient: Hutch Rhett Larose  Procedure(s) Performed: ESOPHAGOGASTRODUODENOSCOPY (EGD) (N/A ) COLONOSCOPY WITH PROPOFOL (N/A )  Patient Location: PACU  Anesthesia Type:MAC  Level of Consciousness: awake, alert  and oriented  Airway & Oxygen Therapy: Patient Spontanous Breathing and Patient connected to nasal cannula oxygen  Post-op Assessment: Report given to RN  Post vital signs: Reviewed and stable  Last Vitals:  Vitals:   11/18/17 0600 11/18/17 1033  BP: (!) 151/63 (!) 188/61  Pulse: 64 62  Resp: 20 12  Temp: 36.7 C 36.8 C  SpO2: 97% 97%    Last Pain:  Vitals:   11/18/17 1033  TempSrc: Oral  PainSc: 3       Patients Stated Pain Goal: 2 (68/11/57 2620)  Complications: No apparent anesthesia complications

## 2017-11-18 NOTE — Progress Notes (Signed)
PT Cancellation Note  Patient Details Name: Todd Romero MRN: [id number] DOB: 24-Oct-1933   Cancelled Treatment:    Reason Eval/Treat Not Completed: Patient at procedure or test/unavailable   [facility] 11/18/2017, 11:07 AM

## 2017-11-18 NOTE — Progress Notes (Signed)
OT Cancellation Note  Patient Details Name: Todd Romero MRN: [id number] DOB: Mar 30, 1934   Cancelled Treatment:    Reason Eval/Treat Not Completed: OT screened, no needs identified, will sign off. Pt had recent TKA and wife assisted with ADLs. He has been up to bathroom and feels comfortable with this. Will sign off.  Wyatt Thorstenson 11/18/2017, 2:32 PM  Lesle Chris, OTR/L [phone] 11/18/2017

## 2017-11-18 NOTE — Anesthesia Postprocedure Evaluation (Signed)
Anesthesia Post Note  Patient: Jonuel Butterfield Vidana  Procedure(s) Performed: ESOPHAGOGASTRODUODENOSCOPY (EGD) (N/A ) COLONOSCOPY WITH PROPOFOL (N/A )     Patient location during evaluation: Endoscopy Anesthesia Type: MAC Level of consciousness: awake and alert Pain management: pain level controlled Vital Signs Assessment: post-procedure vital signs reviewed and stable Respiratory status: spontaneous breathing, nonlabored ventilation, respiratory function stable and patient connected to nasal cannula oxygen Cardiovascular status: stable and blood pressure returned to baseline Postop Assessment: no apparent nausea or vomiting Anesthetic complications: no    Last Vitals:  Vitals:   11/18/17 1142 11/18/17 1150  BP: (!) 131/35 (!) 158/61  Pulse: 60 (!) 57  Resp: (!) 22 20  Temp:    SpO2: 100% 98%    Last Pain:  Vitals:   11/18/17 1033  TempSrc: Oral  PainSc: 3                  Barnet Glasgow

## 2017-11-18 NOTE — Op Note (Signed)
[facility] Patient Name: Todd Romero Procedure Date: 11/18/2017 MRN: [id number] Attending MD: Carol Ada , MD Date of Birth: 1933-12-23 CSN: [id number] Age: 84 Admit Type: Outpatient Procedure:                Upper GI endoscopy Indications:              Iron deficiency anemia, Nausea with vomiting Providers:                Carol Ada, MD, Cleda Daub, RN, Charolette Child,                            Technician, Arnoldo Hooker, CRNA Referring MD:              Medicines:                Propofol per Anesthesia Complications:            No immediate complications. Estimated Blood Loss:     Estimated blood loss: none. Procedure:                Pre-Anesthesia Assessment:                           - Prior to the procedure, a History and Physical                            was performed, and patient medications and                            allergies were reviewed. The patient's tolerance of                            previous anesthesia was also reviewed. The risks                            and benefits of the procedure and the sedation                            options and risks were discussed with the patient.                            All questions were answered, and informed consent                            was obtained. Prior Anticoagulants: The patient has                            taken Plavix (clopidogrel), last dose was 2 days                            prior to procedure. ASA Grade Assessment: III - A                            patient with severe systemic disease. After  reviewing the risks and benefits, the patient was                            deemed in satisfactory condition to undergo the                            procedure.                           - Sedation was administered by an anesthesia                            professional. Deep sedation was attained.                           After obtaining informed consent,  the endoscope was                            passed under direct vision. Throughout the                            procedure, the patient's blood pressure, pulse, and                            oxygen saturations were monitored continuously. The                            EC-[id number] ([id number]) scope was introduced through                            the mouth, and advanced to the second part of                            duodenum. The upper GI endoscopy was accomplished                            without difficulty. The patient tolerated the                            procedure well. Scope In: Scope Out: Findings:      LA Grade D (one or more mucosal breaks involving at least 75% of       esophageal circumference) esophagitis with no bleeding was found.      A 4 cm hiatal hernia was present.      One non-bleeding cratered gastric ulcer with a clean ulcer base (Forrest       Class III) was found in the gastric antrum. The lesion was 10 mm in       largest dimension.      Segmental moderate inflammation characterized by erosions and erythema       was found in the gastric body. Biopsies were taken with a cold forceps       for histology.      Three non-bleeding cratered duodenal ulcers with a clean ulcer base       (Forrest Class III) were found in the duodenal bulb and in the  second       portion of the duodenum. The largest lesion was 20 mm in largest       dimension. Impression:               - LA Grade D reflux esophagitis.                           - 4 cm hiatal hernia.                           - Non-bleeding gastric ulcer with a clean ulcer                            base (Forrest Class III).                           - Gastritis. Biopsied.                           - Multiple non-bleeding duodenal ulcers with a                            clean ulcer base (Forrest Class III). Moderate Sedation:      N/A- Per Anesthesia Care Recommendation:           - Return patient to hospital  ward for ongoing care.                           - Resume regular diet.                           - Continue present medications.                           - Await pathology results.                           - PPI indefinitely. Procedure Code(s):        --- Professional ---                           [phone], Esophagogastroduodenoscopy, flexible,                            transoral; with biopsy, single or multiple Diagnosis Code(s):        --- Professional ---                           K21.0, Gastro-esophageal reflux disease with                            esophagitis                           K44.9, Diaphragmatic hernia without obstruction or                            gangrene  K25.9, Gastric ulcer, unspecified as acute or                            chronic, without hemorrhage or perforation                           K29.70, Gastritis, unspecified, without bleeding                           K26.9, Duodenal ulcer, unspecified as acute or                            chronic, without hemorrhage or perforation                           D50.9, Iron deficiency anemia, unspecified                           R11.2, Nausea with vomiting, unspecified CPT copyright 2016 [employer] Association. All rights reserved. The codes documented in this report are preliminary and upon coder review may  be revised to meet current compliance requirements. Carol Ada, MD Carol Ada, MD 11/18/2017 11:43:50 AM This report has been signed electronically. Number of Addenda: 0

## 2017-11-19 DIAGNOSIS — K210 Gastro-esophageal reflux disease with esophagitis: Secondary | ICD-10-CM

## 2017-11-19 DIAGNOSIS — K250 Acute gastric ulcer with hemorrhage: Secondary | ICD-10-CM

## 2017-11-19 DIAGNOSIS — K269 Duodenal ulcer, unspecified as acute or chronic, without hemorrhage or perforation: Secondary | ICD-10-CM | POA: Diagnosis not present

## 2017-11-19 DIAGNOSIS — E669 Obesity, unspecified: Secondary | ICD-10-CM | POA: Diagnosis not present

## 2017-11-19 DIAGNOSIS — D649 Anemia, unspecified: Secondary | ICD-10-CM | POA: Diagnosis not present

## 2017-11-19 DIAGNOSIS — R112 Nausea with vomiting, unspecified: Secondary | ICD-10-CM | POA: Diagnosis not present

## 2017-11-19 DIAGNOSIS — R195 Other fecal abnormalities: Secondary | ICD-10-CM | POA: Diagnosis not present

## 2017-11-19 DIAGNOSIS — E1169 Type 2 diabetes mellitus with other specified complication: Secondary | ICD-10-CM | POA: Diagnosis not present

## 2017-11-19 DIAGNOSIS — K2970 Gastritis, unspecified, without bleeding: Secondary | ICD-10-CM | POA: Diagnosis not present

## 2017-11-19 DIAGNOSIS — D72829 Elevated white blood cell count, unspecified: Secondary | ICD-10-CM | POA: Diagnosis not present

## 2017-11-19 DIAGNOSIS — K922 Gastrointestinal hemorrhage, unspecified: Secondary | ICD-10-CM | POA: Diagnosis not present

## 2017-11-19 DIAGNOSIS — K254 Chronic or unspecified gastric ulcer with hemorrhage: Secondary | ICD-10-CM | POA: Diagnosis not present

## 2017-11-19 LAB — CBC WITH DIFFERENTIAL/PLATELET
Basophils Absolute: 0 10*3/uL (ref 0.0–0.1)
Basophils Relative: 0 %
EOS ABS: 0.2 10*3/uL (ref 0.0–0.7)
Eosinophils Relative: 2 %
HEMATOCRIT: 25.2 % — AB (ref 39.0–52.0)
HEMOGLOBIN: 8.3 g/dL — AB (ref 13.0–17.0)
Lymphocytes Relative: 16 %
Lymphs Abs: 1.7 10*3/uL (ref 0.7–4.0)
MCH: 31.1 pg (ref 26.0–34.0)
MCHC: 32.9 g/dL (ref 30.0–36.0)
MCV: 94.4 fL (ref 78.0–100.0)
Monocytes Absolute: 1.4 10*3/uL — ABNORMAL HIGH (ref 0.1–1.0)
Monocytes Relative: 13 %
NEUTROS PCT: 69 %
Neutro Abs: 7.6 10*3/uL (ref 1.7–7.7)
Platelets: 285 10*3/uL (ref 150–400)
RBC: 2.67 MIL/uL — ABNORMAL LOW (ref 4.22–5.81)
RDW: 14.5 % (ref 11.5–15.5)
WBC: 10.9 10*3/uL — ABNORMAL HIGH (ref 4.0–10.5)

## 2017-11-19 LAB — COMPREHENSIVE METABOLIC PANEL
ALK PHOS: 46 U/L (ref 38–126)
ALT: 13 U/L — ABNORMAL LOW (ref 17–63)
ANION GAP: 5 (ref 5–15)
AST: 16 U/L (ref 15–41)
Albumin: 2.5 g/dL — ABNORMAL LOW (ref 3.5–5.0)
BILIRUBIN TOTAL: 0.3 mg/dL (ref 0.3–1.2)
BUN: 19 mg/dL (ref 6–20)
CALCIUM: 8.6 mg/dL — AB (ref 8.9–10.3)
CO2: 26 mmol/L (ref 22–32)
Chloride: 108 mmol/L (ref 101–111)
Creatinine, Ser: 0.96 mg/dL (ref 0.61–1.24)
GFR calc non Af Amer: >60 mL/min (ref >60)
Glucose, Bld: 114 mg/dL — ABNORMAL HIGH (ref 65–99)
Potassium: 3.9 mmol/L (ref 3.5–5.1)
SODIUM: 139 mmol/L (ref 135–145)
TOTAL PROTEIN: 5.1 g/dL — AB (ref 6.5–8.1)

## 2017-11-19 LAB — PHOSPHORUS: PHOSPHORUS: 2.7 mg/dL (ref 2.5–4.6)

## 2017-11-19 LAB — GLUCOSE, CAPILLARY
GLUCOSE-CAPILLARY: 123 mg/dL — AB (ref 65–99)
Glucose-Capillary: 155 mg/dL — ABNORMAL HIGH (ref 65–99)

## 2017-11-19 LAB — MAGNESIUM: MAGNESIUM: 2 mg/dL (ref 1.7–2.4)

## 2017-11-19 MED ORDER — ASPIRIN EC 81 MG PO TBEC: 81.0000 mg | DELAYED_RELEASE_TABLET | Freq: Every day | ORAL | Status: DC

## 2017-11-19 MED ORDER — CLOPIDOGREL BISULFATE 75 MG PO TABS: 75.0000 mg | ORAL_TABLET | Freq: Every day | ORAL | 3 refills | Status: DC

## 2017-11-19 MED ORDER — FUROSEMIDE 20 MG PO TABS: 20.0000 mg | ORAL_TABLET | Freq: Every day | ORAL | 0 refills | Status: DC

## 2017-11-19 MED ORDER — PANTOPRAZOLE SODIUM 40 MG PO TBEC: 40.0000 mg | DELAYED_RELEASE_TABLET | Freq: Two times a day (BID) | ORAL | 0 refills | Status: DC

## 2017-11-19 NOTE — Evaluation (Signed)
Physical Therapy Evaluation Patient Details Name: Todd Romero MRN: [id number] DOB: 05-10-1934 Today's Date: 11/19/2017   History of Present Illness  Todd Romero  is a 84 y.o. male, w Hypertension, CAD, CHF (diastolic), AS s/p TAVR,  Dm2, s/p recent TKA presents w c/o n/v, diarrhea for the past 3 days.  Pt attributed this to Bactrim and stopped.  Pt was feeling increasing weak and therefore presented to ED.  Found to have GIB now s/p colonoscopy/EGD.  Clinical Impression  Patient presents with decreased mobility due to recent TKA.  Currently S level with RW.  Feel safe for d/c home with follow up HHPT for TKA to resume at d/c.     Follow Up Recommendations Home health PT    Equipment Recommendations  None recommended by PT    Recommendations for Other Services       Precautions / Restrictions Precautions Precautions: Knee Restrictions RLE Weight Bearing: Weight bearing as tolerated      Mobility  Bed Mobility   Bed Mobility: Supine to Sit     Supine to sit: Supervision        Transfers Overall transfer level: Needs assistance Equipment used: Rolling walker (2 wheeled) Transfers: Sit to/from Stand Sit to Stand: Supervision         General transfer comment: assist for safety  Ambulation/Gait Ambulation/Gait assistance: Supervision Ambulation Distance (Feet): 150 Feet Assistive device: Rolling walker (2 wheeled) Gait Pattern/deviations: Step-through pattern;Step-to pattern;Trunk flexed     General Gait Details: good technique with increasing step length over time and fairly steady with walker throughout  Stairs Stairs: Yes Stairs assistance: Supervision;Min guard Stair Management: One rail Left;Forwards;Step to pattern Number of Stairs: 3 General stair comments: held both hands to L rail, but still up forwards to take L foot up first  Wheelchair Mobility    Modified Rankin (Stroke Patients Only)       Balance Overall balance  assessment: Needs assistance   Sitting balance-Leahy Scale: Good       Standing balance-Leahy Scale: Fair                               Pertinent Vitals/Pain Faces Pain Scale: Hurts a little bit Pain Location: R knee Pain Descriptors / Indicators: Tightness Pain Intervention(s): Monitored during session;Repositioned    Home Living Family/patient expects to be discharged to:: Private residence Living Arrangements: Spouse/significant other Available Help at Discharge: Family;Available 24 hours/day Type of Home: House Home Access: Stairs to enter Entrance Stairs-Rails: Left Entrance Stairs-Number of Steps: 3 Home Layout: Multi-level;Able to live on main level with bedroom/bathroom Home Equipment: Kasandra Romero - single point;Walker - 2 wheels      Prior Function Level of Independence: Independent with assistive device(s)               Hand Dominance        Extremity/Trunk Assessment        Lower Extremity Assessment Lower Extremity Assessment: RLE deficits/detail RLE Deficits / Details: strength at least 3/5, knee flexion grossly 70 at EOB, extension about -10       Communication   Communication: HOH  Cognition Arousal/Alertness: Awake/alert Behavior During Therapy: WFL for tasks assessed/performed Overall Cognitive Status: Within Functional Limits for tasks assessed  General Comments      Exercises Total Joint Exercises Ankle Circles/Pumps: AROM;Both;10 reps;Seated Heel Slides: Right;Seated;AROM;20 reps;Supine   Assessment/Plan    PT Assessment All further PT needs can be met in the next venue of care  PT Problem List         PT Treatment Interventions      PT Goals (Current goals can be found in the Care Plan section)  Acute Rehab PT Goals PT Goal Formulation: All assessment and education complete, DC therapy    Frequency     Barriers to discharge        Co-evaluation                AM-PAC PT "6 Clicks" Daily Activity  Outcome Measure Difficulty turning over in bed (including adjusting bedclothes, sheets and blankets)?: A Little Difficulty moving from lying on back to sitting on the side of the bed? : A Little Difficulty sitting down on and standing up from a chair with arms (e.g., wheelchair, bedside commode, etc,.)?: A Little Help needed moving to and from a bed to chair (including a wheelchair)?: A Little Help needed walking in hospital room?: A Little Help needed climbing 3-5 steps with a railing? : A Little 6 Click Score: 18    End of Session Equipment Utilized During Treatment: Gait belt Activity Tolerance: Patient tolerated treatment well Patient left: in bed;with call bell/phone within reach   PT Visit Diagnosis: Other abnormalities of gait and mobility (R26.89)    Time: 0854-0910 PT Time Calculation (min) (ACUTE ONLY): 16 min   Charges:   PT Evaluation $PT Eval Low Complexity: 1 Low     PT G Codes:        Cyndi Wynn, PT [phone] 11/19/2017   Cynthia Wynn 11/19/2017, 9:41 AM   

## 2017-11-19 NOTE — Progress Notes (Signed)
Notified [facility] of scheduled dc home today with HH. Pt has RW at home. Jonnie Finner RN CCM Case Mgmt phone [phone]

## 2017-11-19 NOTE — Discharge Summary (Signed)
Physician Discharge Summary  Todd Romero YDX:[id number] DOB: 02-19-1934 DOA: 11/16/2017  PCP: [name], MD  Admit date: 11/16/2017 Discharge date: 11/19/2017  Admitted From: Home Disposition: [place] PT  Recommendations for Outpatient Follow-up:  1. Follow up with PCP in 1-2 weeks 2. Follow up with Gastroenterology Dr. Collene Mares in 1-2 weeks 3. Follow up with Cardiology Dr. Tamala Julian in 1 week 4. Follow up with Dr. Angelena Form in 1-2 months 5. Follow up with Orthopedics in 1 week 6. Please obtain CMP/CBC, Mag, Phos in one week 7. Please follow up on the following pending results:  Home Health: YES Equipment/Devices: None recommended by PT  Discharge Condition: Stable CODE STATUS: FULL CODE Diet recommendation: Heart Healthy Carb Modified Diet  Brief/Interim Summary: RichardRutgersonis a84 y.o.male,w Hypertension, CAD, CHF (diastolic), AS s/p TAVR, Dm2, s/p recent apparently presents w c/o n/v, diarrhea for the past 3 days. Pt attributed this to Bactrim and stopped. Pt was feeling increasing weak and therefore presented to ED. He was admitted for Nausea/Vomiting/Diarrhea and Symptomatic Anemia.Gastroenterology Dr. Benson Norway was consulted and will evaluate the patient. Plan is for EGD/Colonoscopy but Dr. Benson Norway requesting Cardiology to evaluate to clear the patient. Cardiology consulted for further evaluation and Cardiac Clearance. Cardiology cleared the patient and patient underwent EGD and colonoscopy yeseterday AM. He improved and was placed on BID PPI given results of EGD. GI recommended that he go back on his ASA/Plavix on Monday. PT evaluated him and recommended home health PT. He improved and was deemed medically stable to D/C Home and will need to follow up with PCP, Cardiology, Gastroenterology, and Orthopedic Surgery as an outpatient.   Discharge Diagnoses:  Principal Problem:   Anemia Active Problems:   Heme positive stool   Leukocytosis   GIB (gastrointestinal  bleeding)  Intractable Nausea and Vomiting, Improved -Admitted to Telemetry -? Medication induced after taking Bactrim -C/w Antiemetics with Zofran 4 mg q6hprn N/V -Given IVF with NS at 75 mL/hr -Able to Tolerate Diet today   Symptomatic Normocytic Anemia 2/2 to Esophagitis, Gastritis, Gastric and Duodenal Ulcers -Patient now Regular Diet after EGD  -FOBT was Positive; Patient was taking ASA 325 mg po BID after Knee Arthroscopy and is on Plavix 75 mg po Daily after TAVR;  -HELD ASA 81 mg po Daily and Plavix 75 mg  -Gastroenterology Dr. Benson Norway consulted and appreciate further evaluation and recommendations and recommended EGD and Colonoscopy; See below -Anemia Panel showed Iron level of 23, UIBC of 180, TIBC of 203, Saturation Ratios of 11, Ferritin of 234, and Vitamin B12 of 732 -Hold patient's Ferrous Sulfate 325 mg po daily  -Hb/Hct went from 9.6/28.7 -> 8.9/26.7 -> 8.8/26.6 -> 8.3/25.2 but prior to TKA patient's Hb was 82.5 -C/w PPI BID Long Term -Continue to Monitor S/Sx of Bleeding -Repeat CBC in AM -Per GI ok to resume ASA an Plavix in 3 day and on Monday  -Get PT/OT Evaluation and recommending Home Health PT  Upper GIB -Patient stated had some Melena/Dark Stools -EGD done and showed patient had LA Grade D Reflux Esophagitis, 4 cm hiatal hernia, Non-bleeding gastric ulcer with a clean ulcer base, Gastritis that was biopsied, and Multiple (at least 3 cratered non bleeding duodenal Ulceers -GI Advanced Diet to Regular Diet and recommending PPI BID indefinitely -Follow up with Pathology results as an outpatient -Colonoscopy showed Patent functional end-to-end ileocolonic anastomosis  -GI recommending PPI BID indefinitely   Leukocytosis, -Likely Reactive to the Nausea and Vomiting -WBC went from 14.0 -> 12.3 -> 10.4 -> 10.9 -  Continue to Monitor for S/Sx of Infection -Repeat CBC in AM   Diarrhea, improved -Resolved and currently not having any  -Feel like it was Abx  induced -GI Pathogen Panel and C Difficile were mentioned in H and P but don't see them ordered -Will not order them as patient has no signs of Diarrhea  Diabetes Mellitus Type 2  -Hold Home Metformin 500 mg po BID, and Liraglutide 1.8 mg qHS -Restarted Home Insulin NPH -Started on Sensitive Novolog SSI -Continue to Monitor CBG's; CBG's ranging from 123-156  Elevated Troponin -Relatively Flat and went from 0.06 -> 0.06 -> 0.05 -Denies any Chest Pain -Cardiology Consulted at request of Dr. Benson Norway and Cleared Patient for EGD   Severe Aortic Stenosis s/p TAVR -Held ASA 81 mg and Clopidogrel 75 mg po Daily for now; Per Cardiology  -Per Cardiology ok to hold ASA and Plavix and ok to resume when GI feels ok -After Discussing with GI, Dr. Benson Norway feels like patient can go back on ASA/Plavix on 11/21/17  -Follow up with Dr. Angelena Form as an outpatient   CAD  -Held ASA 81 mg po Daily, Clopidogrel 75 mg po Daily for now, -C/w Irbesartan substitution of Olmesartan 40 mg po Daily  -Follow up with Dr. Daneen Schick as an outpatient   Essential HTN -C/w Irbesartan 150 mg po Daily substitution of Olmesartan 40 mg po Daily -C/w Amlodipine 2.5 mg po daily    HLD/Hypercholestrolemia -C/w Pravastatin 80 mg po qHS  Obesity  -Weight Loss Counseling given   Chronic Diastolic CHF  -Currently not decompensated -C/w Irbesartan 150 mg po Daily and Furosemide 20 mg po Daily  -Follow up with Dr. Tamala Julian as an outpatient   Recent Total Right Knee Arthroplasty -Was on ASA 325 mg po BID by Ortho Dr. Rhona Raider;  -I called [name] and spoke to Ortho PA [name] who recommended stopping ASA 325 mg po BID and just resuming ASA 81 mg po daily and Clopidogrel 75 mg po Daily. Will do so likely Monday 11/21/17 -Pain Control with Hydrocodone-Acetaminophen 7.5-325 1-2 tablets q4hprn -Follow up with Ortho as an outpatient -PT recommending Home Health PT   Discharge Instructions  Discharge Instructions     (HEART FAILURE PATIENTS) Call MD:  Anytime you have any of the following symptoms: 1) 3 pound weight gain in 24 hours or 5 pounds in 1 week 2) shortness of breath, with or without a dry hacking cough 3) swelling in the hands, feet or stomach 4) if you have to sleep on extra pillows at night in order to breathe.   Complete by:  As directed    Call MD for:  difficulty breathing, headache or visual disturbances   Complete by:  As directed    Call MD for:  extreme fatigue   Complete by:  As directed    Call MD for:  hives   Complete by:  As directed    Call MD for:  persistant dizziness or light-headedness   Complete by:  As directed    Call MD for:  persistant nausea and vomiting   Complete by:  As directed    Call MD for:  redness, tenderness, or signs of infection (pain, swelling, redness, odor or green/yellow discharge around incision site)   Complete by:  As directed    Call MD for:  severe uncontrolled pain   Complete by:  As directed    Call MD for:  temperature >100.4   Complete by:  As directed  Diet - low sodium heart healthy   Complete by:  As directed    Diet Carb Modified   Complete by:  As directed    Discharge instructions   Complete by:  As directed    Follow up with PCP, Cardiology Dr. Daneen Schick, and Gastroenterology Dr. Collene Mares. Take all medications as prescribed. If symptoms change or worsen please return to the ED for evaluation.   Increase activity slowly   Complete by:  As directed      Allergies as of 11/19/2017      Reactions   Bactrim [sulfamethoxazole-trimethoprim] Nausea And Vomiting   Morphine And Related Nausea And Vomiting      Medication List    TAKE these medications   ACCU-CHEK AVIVA PLUS test strip Generic drug:  glucose blood USE AS INSTRUCTED TO CHECK BLOOD SUGAR TWICE A DAY   acetaminophen 500 MG tablet Commonly known as:  TYLENOL Take 500 mg by mouth every 4 (four) hours as needed for moderate pain or headache.   amLODipine 2.5 MG  tablet Commonly known as:  NORVASC Take 2.5 mg by mouth daily.   aspirin EC 81 MG tablet Take 1 tablet (81 mg total) by mouth daily. Start taking on:  11/21/2017 What changed:    These instructions start on 11/21/2017. If you are unsure what to do until then, ask your doctor or other care provider.  Another medication with the same name was removed. Continue taking this medication, and follow the directions you see here.   B-D INS SYR ULTRAFINE 1CC/31G 31G X 5/16" 1 ML Misc Generic drug:  Insulin Syringe-Needle U-100 USE TO INJECT INSULIN DAILY   bisacodyl 5 MG EC tablet Commonly known as:  DULCOLAX Take 1 tablet (5 mg total) by mouth daily as needed for moderate constipation.   clopidogrel 75 MG tablet Commonly known as:  PLAVIX Take 1 tablet (75 mg total) by mouth daily with breakfast. Start taking on:  11/21/2017 What changed:  These instructions start on 11/21/2017. If you are unsure what to do until then, ask your doctor or other care provider.   docusate sodium 100 MG capsule Commonly known as:  COLACE Take 1 capsule (100 mg total) by mouth 2 (two) times daily.   ferrous sulfate 325 (65 FE) MG tablet Take 325 mg by mouth daily with breakfast.   fish oil-omega-3 fatty acids 1000 MG capsule Take 1 g by mouth daily at 12 noon.   furosemide 20 MG tablet Commonly known as:  LASIX Take 1 tablet (20 mg total) by mouth daily.   glimepiride 2 MG tablet Commonly known as:  AMARYL Take 0.5 tablets (1 mg total) by mouth daily.   HUMALOG 100 UNIT/ML injection Generic drug:  insulin lispro Inject 4 to 10 units into the skin three (3) times daily with meals as directed.   HYDROcodone-acetaminophen 7.5-325 MG tablet Commonly known as:  NORCO Take 1-2 tablets by mouth every 4 (four) hours as needed for severe pain.   insulin NPH Human 100 UNIT/ML injection Commonly known as:  HUMULIN N,NOVOLIN N Inject 0.26 mLs (26 Units total) into the skin at bedtime. Uses Vial   Insulin  Pen Needle 31G X 5 MM Misc Commonly known as:  B-D UF III MINI PEN NEEDLES USE 2 PEN NEEDLE PER DAY WITH HUMALOG AND VICTOZA   liraglutide 18 MG/3ML Sopn Commonly known as:  VICTOZA Inject 0.3 mLs (1.8 mg total) into the skin at bedtime.   metFORMIN 500 MG (MOD) 24 hr  tablet Commonly known as:  GLUMETZA Take 1 tablet (500 mg total) by mouth 2 (two) times daily with a meal. (morning & noon)   multivitamin with minerals Tabs tablet Take 1 tablet by mouth daily.   olmesartan 40 MG tablet Commonly known as:  BENICAR Take 1 tablet (40 mg total) by mouth daily.   pantoprazole 40 MG tablet Commonly known as:  PROTONIX Take 1 tablet (40 mg total) by mouth 2 (two) times daily.   pravastatin 80 MG tablet Commonly known as:  PRAVACHOL Take 1 tablet (80 mg total) by mouth every evening.   PRESERVISION AREDS 2 PO Take 1 capsule by mouth 2 (two) times daily.   promethazine 12.5 MG tablet Commonly known as:  PHENERGAN Take 12.5 mg by mouth every 6 (six) hours as needed for nausea/vomiting.   tamsulosin 0.4 MG Caps capsule Commonly known as:  FLOMAX Take 1 capsule daily   tiZANidine 4 MG tablet Commonly known as:  ZANAFLEX Take 1 tablet (4 mg total) by mouth every 6 (six) hours as needed.   vitamin C 500 MG tablet Commonly known as:  ASCORBIC ACID Take 500 mg by mouth daily.      Follow-up Information    Care, [facility] Follow up.   Specialty:  [facility] Why:  Rosa Sanchez will call to arrange follow up visit Contact information: [address] [place] [address] [phone]          Allergies  Allergen Reactions  . Bactrim [Sulfamethoxazole-Trimethoprim] Nausea And Vomiting  . Morphine And Related Nausea And Vomiting   Consultations:  Gastroenterology  Cardiology   Procedures/Studies: Dg Chest 2 View  Result Date: 11/02/2017 CLINICAL DATA:  Preop for total knee arthroplasty.  Hypertension. EXAM: CHEST  2 VIEW  COMPARISON:  03/16/2017 FINDINGS: Mild hyperinflation. Midline trachea. Moderate cardiomegaly. Atherosclerosis in the transverse aorta. Aortic valve replacement. No pleural effusion or pneumothorax. No congestive failure. IMPRESSION: No acute cardiopulmonary disease. Cardiomegaly without congestive failure. Aortic Atherosclerosis (ICD10-I70.0). Electronically Signed   By: Abigail Miyamoto M.D.   On: 11/02/2017 08:50   Dg Chest Portable 1 View  Result Date: 11/16/2017 CLINICAL DATA:  84 year old male with cough and weakness. EXAM: PORTABLE CHEST 1 VIEW COMPARISON:  Chest radiograph dated 11/01/2017 FINDINGS: There is no focal consolidation, pleural effusion, or pneumothorax. Stable cardiomegaly. Aortic valve replacement. Atherosclerotic calcification of the aortic arch. No acute osseous pathology. IMPRESSION: No active disease. Electronically Signed   By: Anner Crete M.D.   On: 11/16/2017 19:46    EGD Findings: LA Grade D (one or more mucosal breaks involving at least 75% of  esophageal circumference) esophagitis with no bleeding was found. A 4 cm hiatal hernia was present. One non-bleeding cratered gastric ulcer with a clean ulcer base (Forrest  Class III) was found in the gastric antrum. The lesion was 10 mm in  largest dimension. Segmental moderate inflammation characterized by erosions and erythema  was found in the gastric body. Biopsies were taken with a cold forceps  for histology. Three non-bleeding cratered duodenal ulcers with a clean ulcer base  (Forrest Class III) were found in the duodenal bulb and in the second  portion of the duodenum. The largest lesion was 20 mm in largest  dimension. Impression: - LA Grade D reflux esophagitis. - 4 cm hiatal hernia. - Non-bleeding gastric ulcer with a clean ulcer  base (Forrest Class  III). - Gastritis. Biopsied. - Multiple non-bleeding duodenal ulcers with a  clean ulcer base (  Forrest Class III).  COLONOSCOPY Findings: There was evidence of a prior functional end-to-end ileo-colonic  anastomosis in the ascending colon. This was patent and was  characterized by healthy appearing mucosa. The anastomosis was traversed. The patient underwent a cecectomy with Dr. Dalbert Batman 09/15/2007. At that time  the patient was under the care of Dr. Lajoyce Corners and polyps were identified. A  tubulovillous adenoma was noted in the cecum with the resected segment.  The prep was not optimal in the proximal colon, but an adequate view was  identified. Impression: - Patent functional end-to-end ileo-colonic  anastomosis, characterized by healthy appearing  mucosa. - No specimens collected.  Subjective: Seen and examined and was doing well. No CP or SOB. Eating well and tolerating diet. Ready to go home.   Discharge Exam: Vitals:   11/18/17 2049 11/19/17 0700  BP: (!) 159/57 (!) 144/65  Pulse: 65 64  Resp: 18 18  Temp: 98.7 F (37.1 C) 98.5 F (36.9 C)  SpO2: 98% 98%   Vitals:   11/18/17 1150 11/18/17 1330 11/18/17 2049 11/19/17 0700  BP: (!) 158/61 (!) 163/67 (!) 159/57 (!) 144/65  Pulse: (!) 57 60 65 64  Resp: 20 18 18 18   Temp:  97.9 F (36.6 C) 98.7 F (37.1 C) 98.5 F (36.9 C)  TempSrc:  Oral Oral Oral  SpO2: 98% 99% 98% 98%  Weight:      Height:       General: Pt is alert, awake, not in acute distress Cardiovascular: RRR, S1/S2 +, no rubs, no gallops Respiratory: Diminished bilaterally, no wheezing, no rhonchi Abdominal: Soft, NT, Distended due to body habitus, bowel sounds + Extremities: no edema, no cyanosis; Right knee with incision appeared C/D/I  The results of  significant diagnostics from this hospitalization (including imaging, microbiology, ancillary and laboratory) are listed below for reference.    Microbiology: No results found for this or any previous visit (from the past 240 hour(s)).   Labs: BNP (last 3 results) No results for input(s): BNP in the last 8760 hours. Basic Metabolic Panel: Recent Labs  Lab 11/16/17 1646 11/17/17 0418 11/18/17 0433 11/19/17 0443  NA 135 135 136 139  K 4.4 4.3 4.1 3.9  CL 101 103 106 108  CO2 24 23 24 26   GLUCOSE 123* 117* 129* 114*  BUN 27* 27* 27* 19  CREATININE 0.93 0.91 0.90 0.96  CALCIUM 9.3 8.8* 8.8* 8.6*  MG  --   --  2.0 2.0  PHOS  --   --  2.7 2.7   Liver Function Tests: Recent Labs  Lab 11/16/17 1646 11/17/17 0418 11/18/17 0433 11/19/17 0443  AST 20 19 23 16   ALT 13* 13* 13* 13*  ALKPHOS 52 44 47 46  BILITOT 0.7 0.7 0.5 0.3  PROT 5.8* 5.3* 5.5* 5.1*  ALBUMIN 2.8* 2.6* 2.7* 2.5*   Recent Labs  Lab 11/16/17 1646  LIPASE 35   No results for input(s): AMMONIA in the last 168 hours. CBC: Recent Labs  Lab 11/16/17 1646 11/16/17 2246 11/17/17 0418 11/18/17 0433 11/19/17 0443  WBC 14.0*  --  12.1* 10.4 10.9*  NEUTROABS  --   --   --  7.5 7.6  HGB 9.6*  --  8.9* 8.8* 8.3*  HCT 28.7* 29.7* 26.7* 26.6* 25.2*  MCV 90.8  --  92.1 93.3 94.4  PLT 282  --  278 286 285   Cardiac Enzymes: Recent Labs  Lab 11/16/17 2246 11/17/17 0418 11/17/17 1027  TROPONINI 0.06* 0.06* 0.05*  BNP: Invalid input(s): POCBNP CBG: Recent Labs  Lab 11/18/17 1215 11/18/17 1700 11/18/17 2028 11/19/17 0742 11/19/17 1200  GLUCAP 92 124* 156* 123* 155*   D-Dimer No results for input(s): DDIMER in the last 72 hours. Hgb A1c No results for input(s): HGBA1C in the last 72 hours. Lipid Profile No results for input(s): CHOL, HDL, LDLCALC, TRIG, CHOLHDL, LDLDIRECT in the last 72 hours. Thyroid function studies No results for input(s): TSH, T4TOTAL, T3FREE, THYROIDAB in the last 72  hours.  Invalid input(s): FREET3 Anemia work up Recent Labs    11/16/17 2246  VITAMINB12 732  FERRITIN 234  TIBC 203*  IRON 23*   Urinalysis    Component Value Date/Time   COLORURINE YELLOW 11/16/2017 1802   APPEARANCEUR CLEAR 11/16/2017 1802   LABSPEC 1.017 11/16/2017 1802   PHURINE 6.0 11/16/2017 1802   GLUCOSEU NEGATIVE 11/16/2017 1802   GLUCOSEU NEGATIVE 03/14/2014 0826   HGBUR NEGATIVE 11/16/2017 1802   BILIRUBINUR NEGATIVE 11/16/2017 1802   BILIRUBINUR negative 01/22/2016 1019   KETONESUR 20 (A) 11/16/2017 1802   PROTEINUR 30 (A) 11/16/2017 1802   UROBILINOGEN 0.2 01/22/2016 1019   UROBILINOGEN 0.2 03/14/2014 0826   NITRITE NEGATIVE 11/16/2017 1802   LEUKOCYTESUR NEGATIVE 11/16/2017 1802   Sepsis Labs Invalid input(s): PROCALCITONIN,  WBC,  LACTICIDVEN Microbiology No results found for this or any previous visit (from the past 240 hour(s)).  Time coordinating discharge: 35 minutes  SIGNED:  Kerney Elbe, DO [facility] 11/19/2017, 12:38 PM Pager [phone]  If 7PM-7AM, please contact night-coverage [email or web address] Password [email or web address]

## 2017-11-19 NOTE — Progress Notes (Addendum)
Patient ID: Todd Romero, male   DOB: 1933/09/21, 84 y.o.   MRN: [id number]    Progress Note   Subjective   Feels good, has been up walking, denies abd pain, no black stools, no dizziness etc Hgb pending this am Pt wants to go home     Objective   Vital signs in last 24 hours: Temp:  [97.9 F (36.6 C)-98.7 F (37.1 C)] 98.5 F (36.9 C) (03/16 0700) Pulse Rate:  [57-65] 64 (03/16 0700) Resp:  [12-22] 18 (03/16 0700) BP: (97-188)/(35-67) 144/65 (03/16 0700) SpO2:  [97 %-100 %] 98 % (03/16 0700) Weight:  [236 lb (107 kg)] 236 lb (107 kg) (03/15 1033) Last BM Date: 11/18/17 General:    Elderly WM in NAD Heart:  Regular rate and rhythm; no murmurs Lungs: Respirations even and unlabored, lungs CTA bilaterally Abdomen:  Soft, nontender and nondistended. Normal bowel sounds. Extremities:  Without edema. Neurologic:  Alert and oriented,  grossly normal neurologically. Psych:  Cooperative. Normal mood and affect.  Intake/Output from previous day: 03/15 0701 - 03/16 0700 In: 640 [P.O.:240; I.V.:400] Out: -  Intake/Output this shift: No intake/output data recorded.  Lab Results: Recent Labs    11/16/17 1646 11/16/17 2246 11/17/17 0418 11/18/17 0433  WBC 14.0*  --  12.1* 10.4  HGB 9.6*  --  8.9* 8.8*  HCT 28.7* 29.7* 26.7* 26.6*  PLT 282  --  278 286   BMET Recent Labs    11/17/17 0418 11/18/17 0433 11/19/17 0443  NA 135 136 139  K 4.3 4.1 3.9  CL 103 106 108  CO2 23 24 26   GLUCOSE 117* 129* 114*  BUN 27* 27* 19  CREATININE 0.91 0.90 0.96  CALCIUM 8.8* 8.8* 8.6*   LFT Recent Labs    11/19/17 0443  PROT 5.1*  ALBUMIN 2.5*  AST 16  ALT 13*  ALKPHOS 46  BILITOT 0.3   PT/INR No results for input(s): LABPROT, INR in the last 72 hours.      Assessment / Plan:     #1 84 yo male with acute upper GI bleeding in setting of high dose ASA and Plavix EGD  And Colon yesterday -with pertinent finding of severe grade D distal esophagitis without active  bleeding, a 4 cm hiatal hernia, one nonbleeding cratered gastric ulcer clean-based, moderate gastritis and 3 nonbleeding cratered duodenal ulcers clean-based  Patient has been stable Follow-up hemoglobin this morning, discussed with hospitalist plan is for discharge home later today as long as hemoglobin stable. Aspirin wouldn't need to be decreased to 81 mg daily, Dr. Benson Norway had okayed Plavix to be restarted next week. He will need twice a day PPI therapy long-term Will need follow-up hemoglobin and week with his PCP, and have asked him to make an appointment to see Dr. Collene Mares in her office in one month.    Contact  Amy Esterwood, P.A.-C               [phone]  Principal Problem:   Anemia Active Problems:   Heme positive stool   Leukocytosis   GIB (gastrointestinal bleeding)   LOS: 1 day   Amy Esterwood  11/19/2017, 9:48 AM  GI ATTENDING  Case discussed with Dr. Benson Norway. Interval history data reviewed.Patient seen and examined. Agree with comprehensive consultation note as outlined above. Patient is clinically stable for discharge on PPI. Needs to follow-up with his PCP primary GI as outlined above.  Docia Chuck. Geri Seminole., M.D. [facility] Division of Gastroenterology

## 2017-11-21 ENCOUNTER — Encounter (HOSPITAL_COMMUNITY): Payer: Self-pay | Admitting: Gastroenterology

## 2017-11-29 NOTE — Telephone Encounter (Signed)
Victoza PA is denied.

## 2017-11-29 NOTE — Telephone Encounter (Signed)
He needs to find out from his insurance what the alternative is and why Victoza was denied

## 2017-11-30 ENCOUNTER — Telehealth: Payer: Self-pay | Admitting: Endocrinology

## 2017-11-30 NOTE — Telephone Encounter (Signed)
Pt informed

## 2017-11-30 NOTE — Telephone Encounter (Signed)
Patient calling concerning his medication his [facility] did not denied this medication, they just need more clinical information.  [facility] - [phone] Patient ID # [id number]

## 2017-12-07 ENCOUNTER — Telehealth: Payer: Self-pay | Admitting: Interventional Cardiology

## 2017-12-07 NOTE — Telephone Encounter (Signed)
New Message   Pt c/o medication issue:  1. Name of Medication: clopidogrel (PLAVIX) 75 MG tablet  2. How are you currently taking this medication (dosage and times per day)? Take 1 tablet (75 mg total) by mouth daily with breakfast  3. Are you having a reaction (difficulty breathing--STAT)? no  4. What is your medication issue? Butch Penny with [facility] is calling because Dr. Collene Mares wants to prescribe a medication that will counteract with the Plavix. Please call

## 2017-12-07 NOTE — Telephone Encounter (Signed)
See note from [place] as the precursor to this note.

## 2017-12-07 NOTE — Telephone Encounter (Signed)
Spoke with Butch Penny.  Pt recently hospitalized with GI bleed and was sent home with Omeprazole.  Butch Penny calling because they received a letter about the interaction with Omeprazole and Plavix.  Advised ok to switch to Protonix.  Butch Penny verbalized understanding and was appreciative for call.

## 2017-12-08 NOTE — Telephone Encounter (Signed)
Called pt and left vm to return my call to see if pt is still on victoza because I do not see it in his med list ,also called [facility] to see what clinical info was needed Margret from [facility] stated that she would fax to office

## 2017-12-12 ENCOUNTER — Telehealth: Payer: Self-pay

## 2017-12-12 NOTE — Telephone Encounter (Signed)
Pt was approved for PA for Victoza 3-pak 0.6mg /0.1 pen injector. Approval is good from 11/08/2017 until 12/07/2020

## 2017-12-13 NOTE — Telephone Encounter (Signed)
PA for Victoza was approved

## 2017-12-27 ENCOUNTER — Telehealth: Payer: Self-pay | Admitting: Endocrinology

## 2017-12-27 ENCOUNTER — Other Ambulatory Visit: Payer: Self-pay

## 2017-12-27 MED ORDER — OLMESARTAN MEDOXOMIL 40 MG PO TABS: 40.0000 mg | ORAL_TABLET | Freq: Every day | ORAL | 3 refills | Status: DC

## 2017-12-27 NOTE — Telephone Encounter (Signed)
olmesartan (BENICAR) 40 MG tablet  Patient needs a  prescription sent in    [place], [place]

## 2017-12-27 NOTE — Telephone Encounter (Signed)
Medication has been sent.  

## 2018-01-04 ENCOUNTER — Other Ambulatory Visit: Payer: Self-pay | Admitting: Endocrinology

## 2018-01-11 IMAGING — CR DG CHEST 2V
2 series · 2 of 2 positions shown · non-contrast
Comparison: 02/11/2015 chest radiograph.

CLINICAL DATA: Preoperative for TAVR.

EXAM:
CHEST  2 VIEW

[w chest pa]
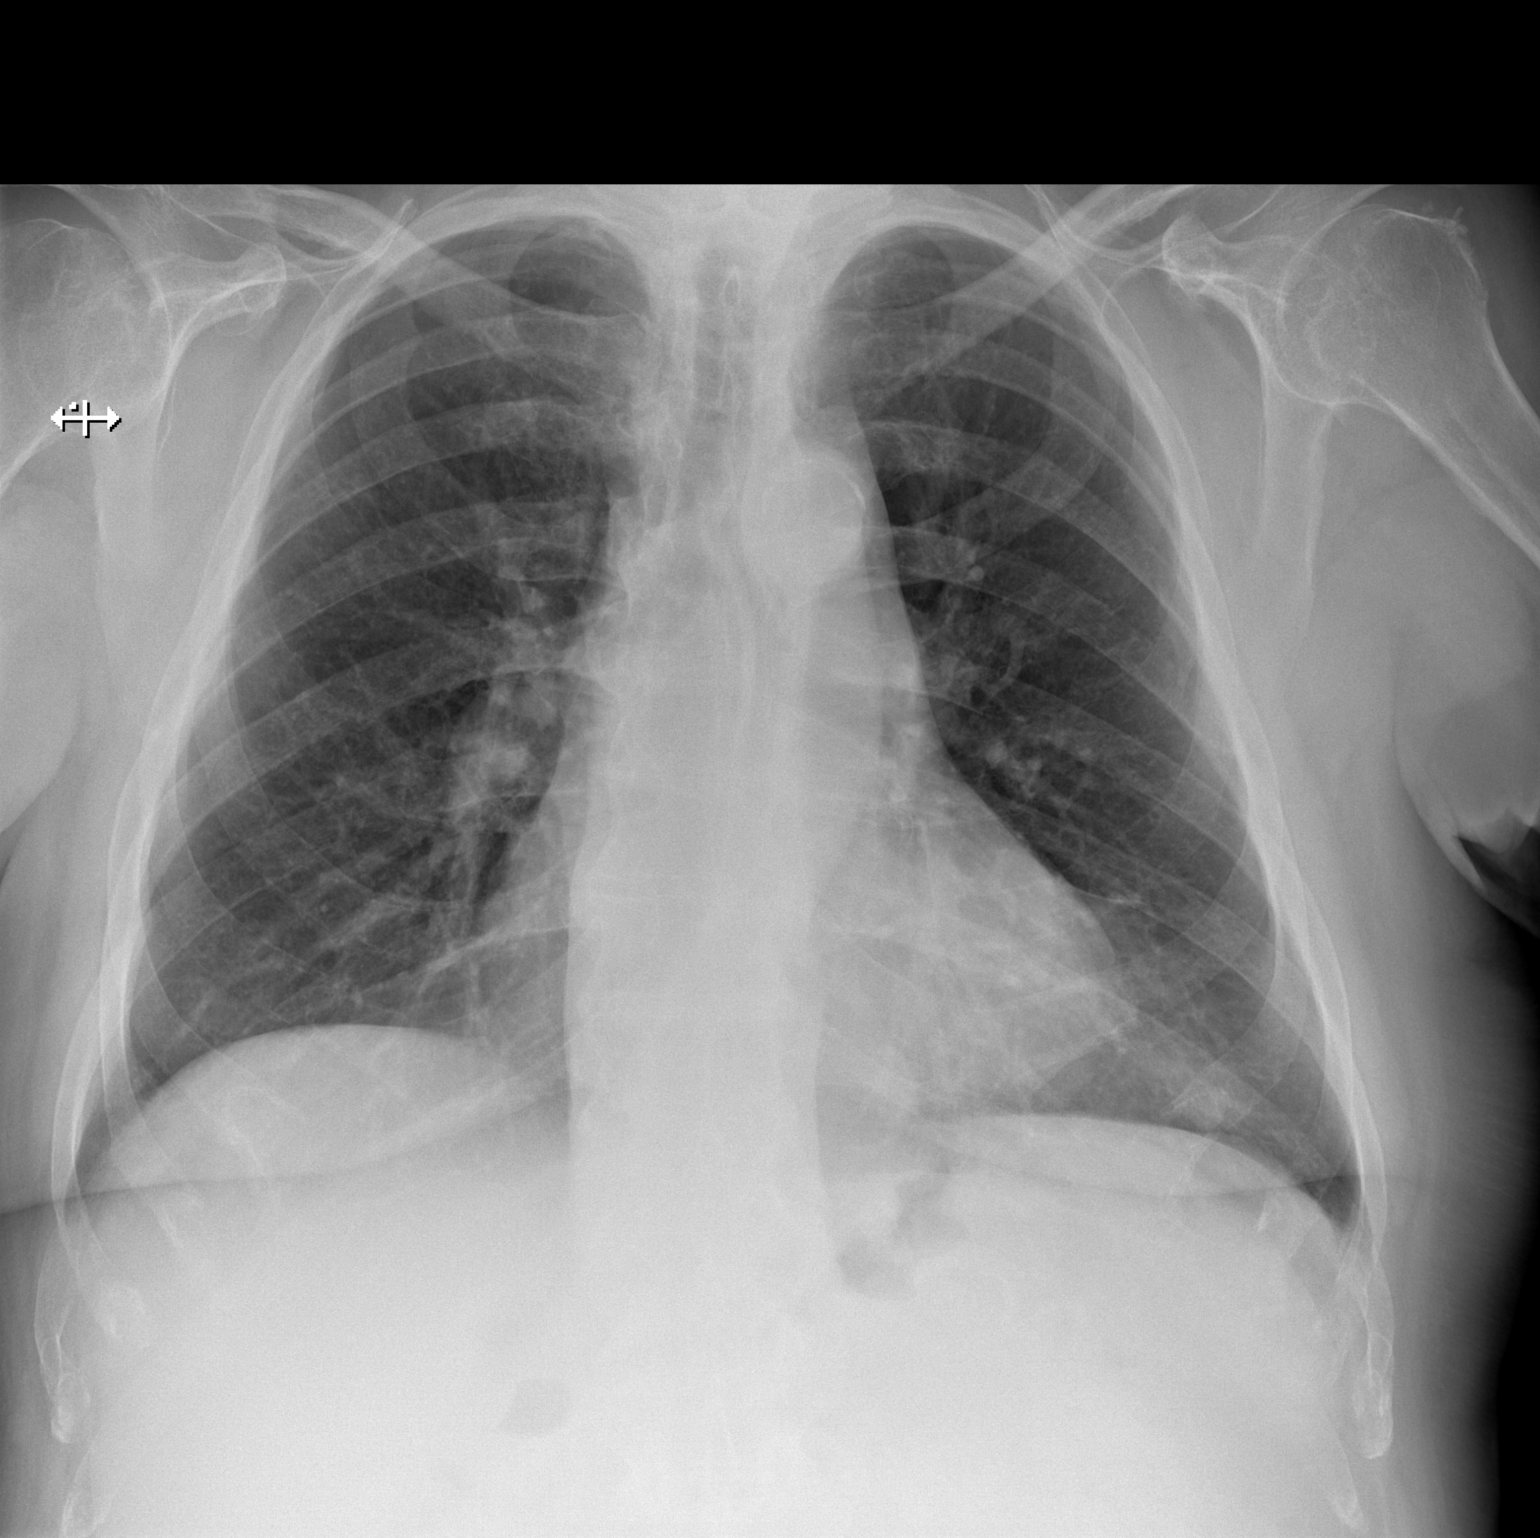

[w chest lat]
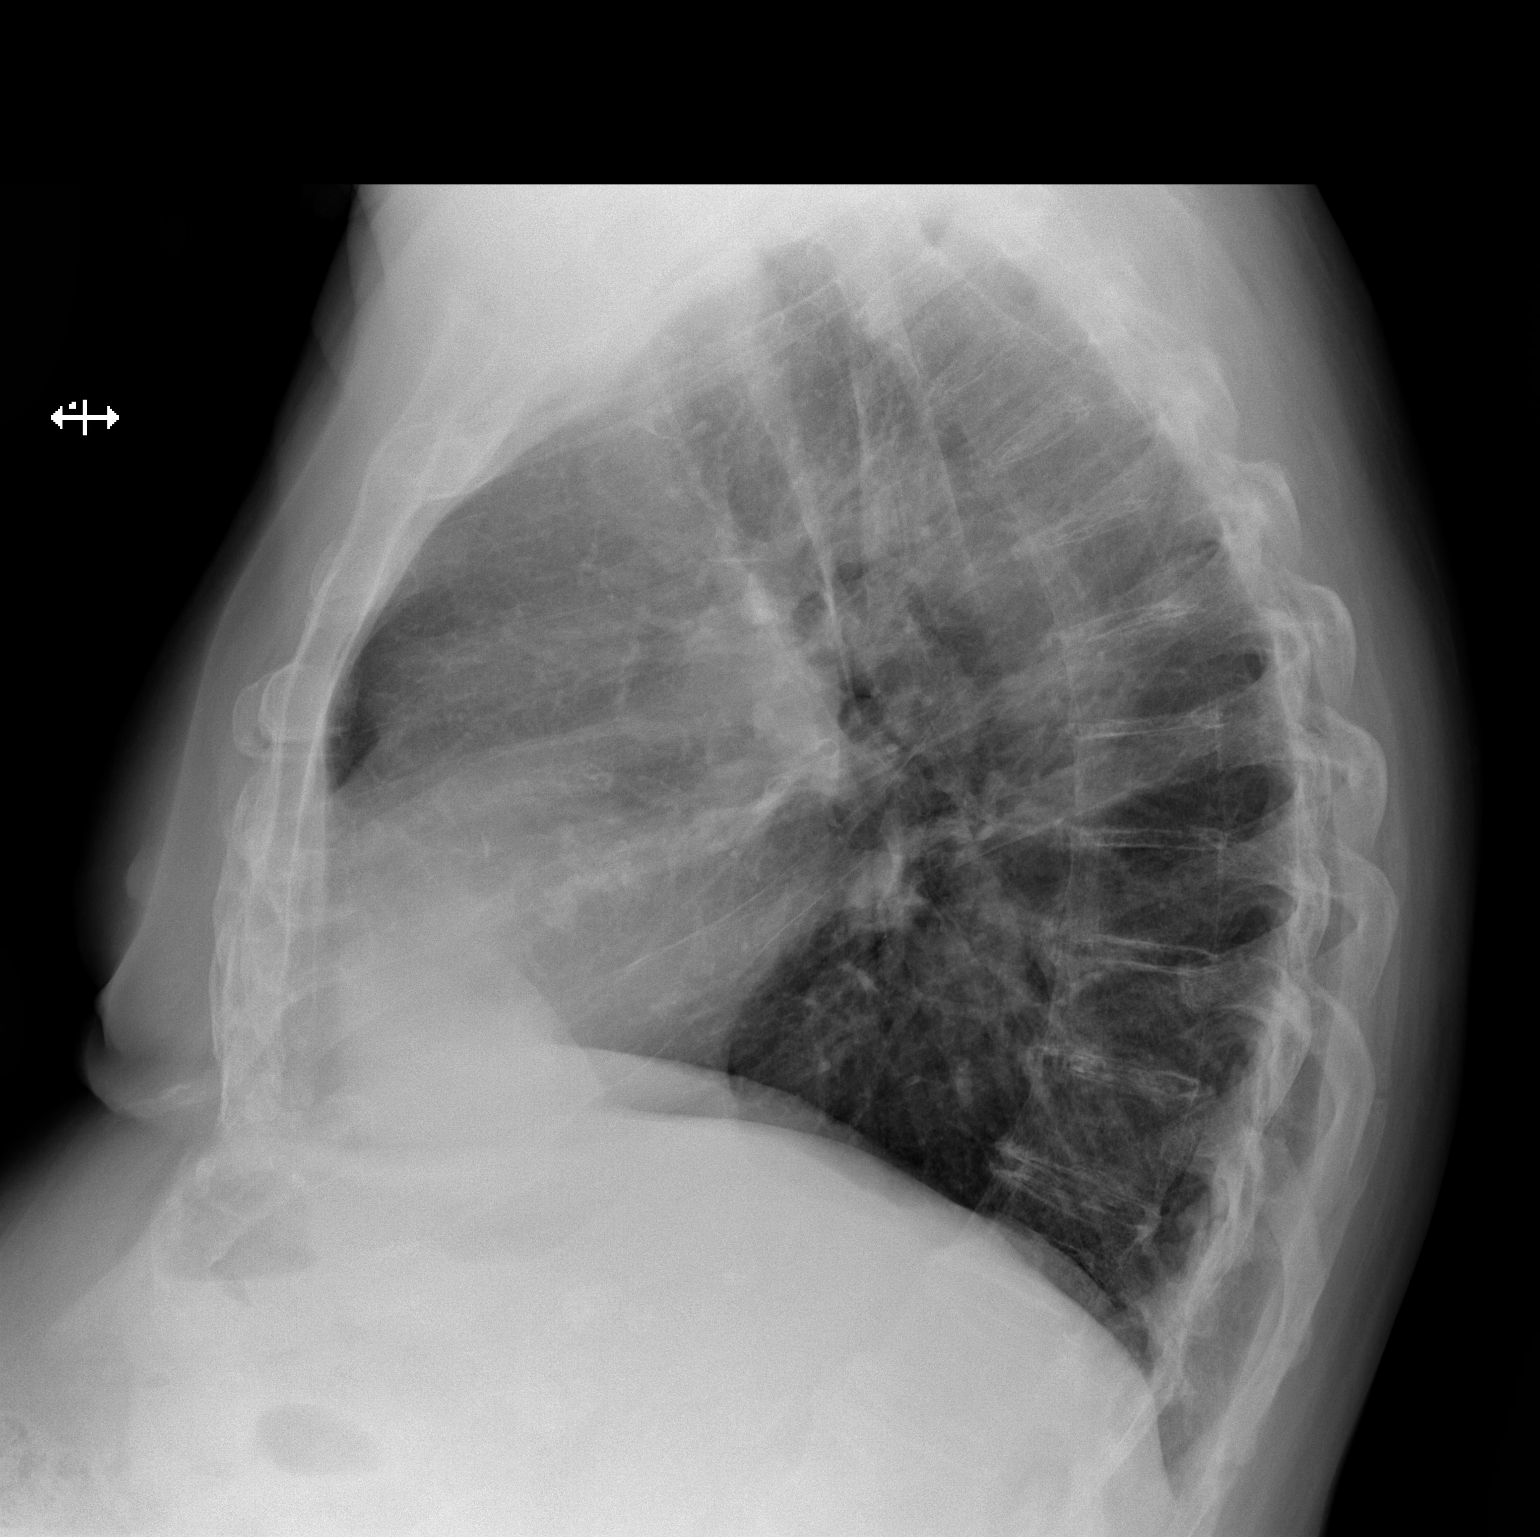

[2 of 2 positions shown; findings below may reference images not displayed]

FINDINGS: Stable cardiomediastinal silhouette with normal heart size and
mildly tortuous atherosclerotic thoracic aorta. No pneumothorax. No
pleural effusion. Lungs appear clear, with no acute consolidative
airspace disease and no pulmonary edema.
IMPRESSION: No active cardiopulmonary disease.

## 2018-01-15 IMAGING — CR DG CHEST 1V PORT
1 series · 1 of 1 positions shown · non-contrast
Comparison: Chest x-ray dated 03/11/2017.

CLINICAL DATA: Atelectasis DACHUAN/Ajis Saputra Lanting

EXAM:
PORTABLE CHEST 1 VIEW

[AP]
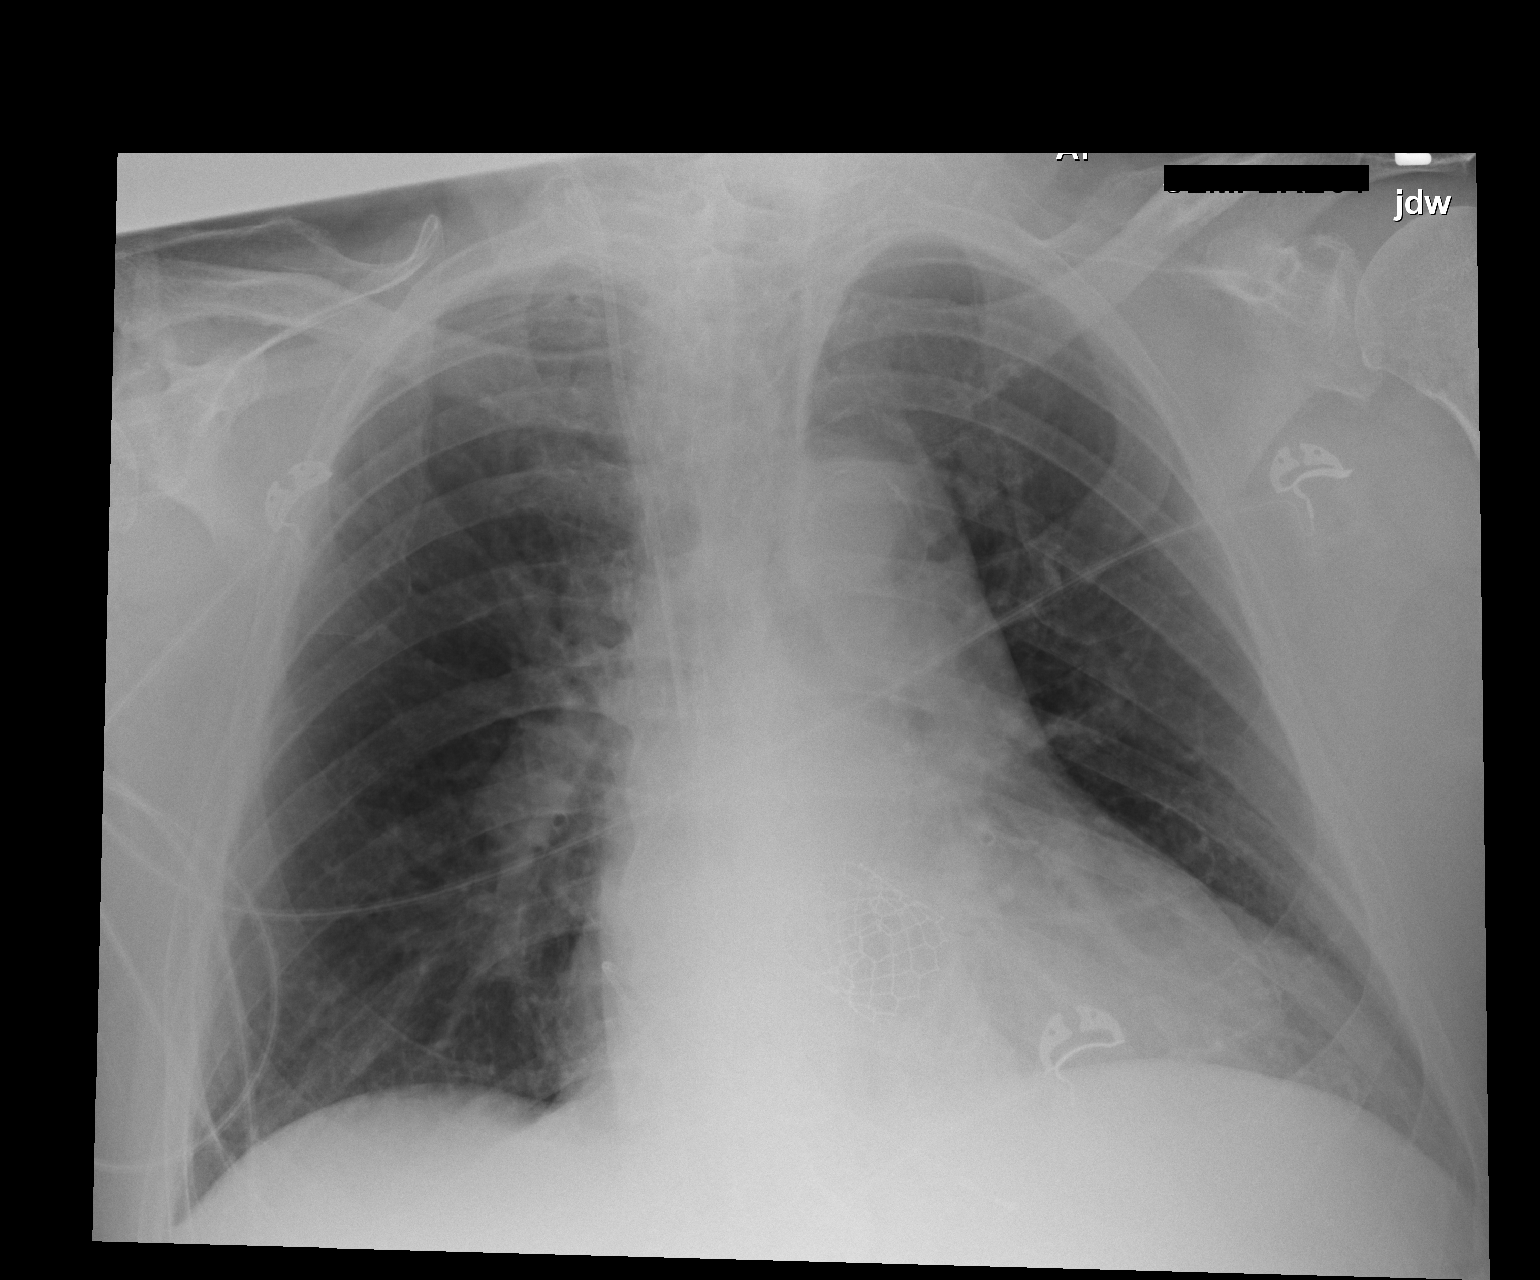

[1 of 1 positions shown; findings below may reference images not displayed]

FINDINGS: New valvular hardware in place. Right IJ central line with tip
adequately positioned at the level of the lower SVC/ cavoatrial
junction. Presumed mediastinal drain overlying the lower
mediastinum. Atherosclerosis of the aortic arch.

Lungs are clear.  No pleural effusion or pneumothorax seen.
IMPRESSION: 1. Status post valvular hardware placement. No evidence of surgical
complicating feature.
2. No pulmonary edema, pleural effusion or pneumothorax.
3. Aortic atherosclerosis.

## 2018-01-27 ENCOUNTER — Other Ambulatory Visit: Payer: Medicare Other

## 2018-01-28 ENCOUNTER — Other Ambulatory Visit: Payer: Self-pay | Admitting: Endocrinology

## 2018-01-29 ENCOUNTER — Other Ambulatory Visit: Payer: Self-pay | Admitting: Endocrinology

## 2018-02-01 ENCOUNTER — Ambulatory Visit: Payer: Medicare Other | Admitting: Endocrinology

## 2018-02-10 ENCOUNTER — Other Ambulatory Visit (INDEPENDENT_AMBULATORY_CARE_PROVIDER_SITE_OTHER): Payer: Medicare Other

## 2018-02-10 DIAGNOSIS — Z794 Long term (current) use of insulin: Secondary | ICD-10-CM

## 2018-02-10 DIAGNOSIS — E1165 Type 2 diabetes mellitus with hyperglycemia: Secondary | ICD-10-CM | POA: Diagnosis not present

## 2018-02-10 LAB — COMPREHENSIVE METABOLIC PANEL
ALT: 12 U/L (ref 0–53)
AST: 19 U/L (ref 0–37)
Albumin: 3.7 g/dL (ref 3.5–5.2)
Alkaline Phosphatase: 57 U/L (ref 39–117)
BUN: 25 mg/dL — ABNORMAL HIGH (ref 6–23)
CALCIUM: 9.1 mg/dL (ref 8.4–10.5)
CO2: 23 meq/L (ref 19–32)
Chloride: 107 mEq/L (ref 96–112)
Creatinine, Ser: 1.01 mg/dL (ref 0.40–1.50)
GFR: 75.19 mL/min (ref >60.00)
Glucose, Bld: 105 mg/dL — ABNORMAL HIGH (ref 70–99)
POTASSIUM: 4.3 meq/L (ref 3.5–5.1)
Sodium: 138 mEq/L (ref 135–145)
Total Bilirubin: 0.4 mg/dL (ref 0.2–1.2)
Total Protein: 6.3 g/dL (ref 6.0–8.3)

## 2018-02-10 LAB — LIPID PANEL
CHOL/HDL RATIO: 3
Cholesterol: 127 mg/dL (ref 0–200)
HDL: 46.3 mg/dL (ref >39.00)
LDL Cholesterol: 60 mg/dL (ref 0–99)
NonHDL: 80.45
TRIGLYCERIDES: 104 mg/dL (ref 0.0–149.0)
VLDL: 20.8 mg/dL (ref 0.0–40.0)

## 2018-02-10 LAB — HEMOGLOBIN A1C: Hgb A1c MFr Bld: 6.9 % — ABNORMAL HIGH (ref 4.6–6.5)

## 2018-02-11 ENCOUNTER — Other Ambulatory Visit: Payer: Self-pay | Admitting: Endocrinology

## 2018-02-13 ENCOUNTER — Other Ambulatory Visit: Payer: Self-pay

## 2018-02-13 ENCOUNTER — Telehealth: Payer: Self-pay | Admitting: Endocrinology

## 2018-02-13 ENCOUNTER — Telehealth: Payer: Self-pay | Admitting: Interventional Cardiology

## 2018-02-13 DIAGNOSIS — Z952 Presence of prosthetic heart valve: Secondary | ICD-10-CM

## 2018-02-13 MED ORDER — GLUCOSE BLOOD VI STRP: ORAL_STRIP | 2 refills | Status: DC

## 2018-02-13 NOTE — Telephone Encounter (Signed)
Patient stated that when he went to get his test strips that the pharamcy stated we denied having this filled for him     [id number] - [place], [place] - [address]

## 2018-02-13 NOTE — Telephone Encounter (Signed)
Called pt and informed them that pt had a refill on his test strips on 01/31/18. Pharmacy stated that insurance is not approving this. Pharmacist requested that refill be resent with dx code and they will attempt to run it through part b and let this office now if it is still not approved.

## 2018-02-13 NOTE — Telephone Encounter (Signed)
Pt calling after receiving a recall for Todd Romero for August. Pt states that Todd Romero wanted to see him in a year. I looked back and pt saw Todd Romero 10-27-17 and said come back in 6 mos and his last appt with Todd Romero was 04-20-17 and he anted him to come back in 11 months, which is due in July, he does not have a recall for [facility]. He wants to know if he still needs to see [facility] and if so does he also need Todd Romero in August?

## 2018-02-13 NOTE — Telephone Encounter (Signed)
Pt called in to make appt with Dr. Tamala Julian for 6 month office visit and was confused that he also needed follow up with Dr. Angelena Form and echo. I advised pt that I will put orders in for the Echo per Dr. Angelena Form last note and will help him to get his appointments scheduled for July/Aug.

## 2018-02-14 NOTE — Telephone Encounter (Signed)
Pt called back and appointments arranged.

## 2018-02-14 NOTE — Telephone Encounter (Signed)
Left message on machine for pt to contact the office.  Pt had TAVR 03/15/2017. Plan to reschedule Echo to 03/15/18 and schedule follow-up office visit with Nell Range PA-C the same day.

## 2018-02-15 ENCOUNTER — Ambulatory Visit (INDEPENDENT_AMBULATORY_CARE_PROVIDER_SITE_OTHER): Payer: Medicare Other | Admitting: Endocrinology

## 2018-02-15 VITALS — BP 142/80 | HR 80 | Ht 68.0 in | Wt 235.6 lb

## 2018-02-15 DIAGNOSIS — R400 Somnolence: Secondary | ICD-10-CM | POA: Diagnosis not present

## 2018-02-15 DIAGNOSIS — D508 Other iron deficiency anemias: Secondary | ICD-10-CM | POA: Diagnosis not present

## 2018-02-15 DIAGNOSIS — Z794 Long term (current) use of insulin: Secondary | ICD-10-CM

## 2018-02-15 DIAGNOSIS — M25472 Effusion, left ankle: Secondary | ICD-10-CM

## 2018-02-15 DIAGNOSIS — M25471 Effusion, right ankle: Secondary | ICD-10-CM

## 2018-02-15 DIAGNOSIS — E1165 Type 2 diabetes mellitus with hyperglycemia: Secondary | ICD-10-CM | POA: Diagnosis not present

## 2018-02-15 LAB — CBC WITH DIFFERENTIAL/PLATELET
Basophils Absolute: 0 10*3/uL (ref 0.0–0.1)
Basophils Relative: 0.5 % (ref 0.0–3.0)
EOS PCT: 2.9 % (ref 0.0–5.0)
Eosinophils Absolute: 0.2 10*3/uL (ref 0.0–0.7)
HCT: 32.8 % — ABNORMAL LOW (ref 39.0–52.0)
Hemoglobin: 10.5 g/dL — ABNORMAL LOW (ref 13.0–17.0)
LYMPHS ABS: 1.9 10*3/uL (ref 0.7–4.0)
Lymphocytes Relative: 29 % (ref 12.0–46.0)
MCHC: 32.1 g/dL (ref 30.0–36.0)
MCV: 89.1 fl (ref 78.0–100.0)
MONOS PCT: 12.7 % — AB (ref 3.0–12.0)
Monocytes Absolute: 0.8 10*3/uL (ref 0.1–1.0)
NEUTROS ABS: 3.6 10*3/uL (ref 1.4–7.7)
NEUTROS PCT: 54.9 % (ref 43.0–77.0)
PLATELETS: 148 10*3/uL — AB (ref 150.0–400.0)
RBC: 3.68 Mil/uL — ABNORMAL LOW (ref 4.22–5.81)
RDW: 16.4 % — AB (ref 11.5–15.5)
WBC: 6.5 10*3/uL (ref 4.0–10.5)

## 2018-02-15 LAB — IBC PANEL
Iron: 35 ug/dL — ABNORMAL LOW (ref 42–165)
SATURATION RATIOS: 10.3 % — AB (ref 20.0–50.0)
Transferrin: 242 mg/dL (ref 212.0–360.0)

## 2018-02-15 NOTE — Patient Instructions (Addendum)
Check blood sugars on waking up  3/7 days  Also check blood sugars about 2 hours after a meal and do this after different meals by rotation  Recommended blood sugar levels on waking up is 90-130 and about 2 hours after meal is 130-160  Please bring your blood sugar monitor to each visit, thank you  Stop Glimeperide  Take 40 alternating 60 Lasix.  Humalog 6 at supper  N insulin 22 units

## 2018-02-15 NOTE — Progress Notes (Signed)
Patient ID: Todd Romero, male   DOB: 10/17/1933, 84 y.o.   MRN: [id number]    Reason for Appointment:   management of various chronic and new problems    History of Present Illness   Diagnosis: Type 2 DIABETES MELITUS, date of diagnosis:  1997   He has been on various regimens for his diabetes and has been on bedtime insulin with NPH since 2005 He also has benefited from adding Victoza in 2011 with better postprandial control and some weight loss Previously his weight has been as much as 247 pounds  On Victoza  since 11/14 with further improvement in blood sugar control. Since about 2015 his blood sugars have been overall mildly high with A1c over 7% consistently.  RECENT HISTORY:  His A1c is 6.9, lower than usual Previously was between 7.3-7.5 on the last 3 measurements  Non-insulin hypoglycemic drugs: Amaryl 1 mg in the a.m. and metformin 500 mg twice a day, Victoza 1.8 mg daily        Side effects from medications: None  Insulin regimen: NPH 26 units at bedtime and Humalog 8 units at suppertime    Current management, blood sugar patterns and problems identified:  Although he was recommended taking 8-10 units of Humalog at least more recently has stopped taking this in the evening  He thinks this is because he is trying to eat lower carbohydrate meals and salads in the evening  However his POSTPRANDIAL readings are averaging over 200 at night  Occasionally he is having falsely high readings when he is not cleaning his fingertips before checking his sugars  Also was told to use only 22/ 24 units of NPH but he is taking higher dose of 26 at night when his sugars are reading higher  Likely not benefiting from low-dose Amaryl  Again does not check readings after breakfast or lunch as directed  Currently not exercising because of recovering from knee surgery and other issues  FASTING readings are excellent more recently may be frequently in the 80s,  occasionally higher if he has juice during the night when thirsty  No hypoglycemia reported  Monitors blood glucose:about 2x a day.    Glucometer:  Accu-Chek         Blood Glucose readings from meter download:   Mean values apply above for all meters except median for One Touch  PRE-MEAL Fasting Lunch Dinner Bedtime Overall  Glucose range:  80-147      Mean/median:      149   POST-MEAL PC Breakfast PC Lunch PC Dinner  Glucose range:    150-253  Mean/median:    204    Previous readings:  Mean values apply above for all meters except median for One Touch  PRE-MEAL Fasting Lunch Dinner Bedtime Overall  Glucose range:  74-133   128    Mean/median:  104     139   POST-MEAL PC Breakfast PC Lunch PC Dinner  Glucose range:    109-328  Mean/median:    180      Physical activity: exercise: Unable to do much activity   Meal times: Dinner about 5-7 PM  Wt Readings from Last 3 Encounters:  02/15/18 235 lb 9.6 oz (106.9 kg)  11/18/17 236 lb (107 kg)  11/08/17 235 lb (106.6 kg)   LABS: Lab Results  Component Value Date   HGBA1C 6.9 (H) 02/10/2018   HGBA1C 7.5 (H) 10/31/2017   HGBA1C 7.3 (H) 05/02/2017   Lab Results  Component  Value Date   MICROALBUR 22.8 (H) 10/31/2017   LDLCALC 60 02/10/2018   CREATININE 1.01 02/10/2018       OTHER active problems are discussed in review of systems    Allergies as of 02/15/2018      Reactions   Bactrim [sulfamethoxazole-trimethoprim] Nausea And Vomiting   Morphine And Related Nausea And Vomiting      Medication List        Accurate as of 02/15/18  1:25 PM. Always use your most recent med list.          acetaminophen 500 MG tablet Commonly known as:  TYLENOL Take 500 mg by mouth every 4 (four) hours as needed for moderate pain or headache.   amLODipine 2.5 MG tablet Commonly known as:  NORVASC Take 2.5 mg by mouth daily.   aspirin EC 81 MG tablet Take 1 tablet (81 mg total) by mouth daily.   clopidogrel 75 MG  tablet Commonly known as:  PLAVIX Take 1 tablet (75 mg total) by mouth daily with breakfast.   ferrous sulfate 325 (65 FE) MG tablet Take 325 mg by mouth daily with breakfast.   fish oil-omega-3 fatty acids 1000 MG capsule Take 1 g by mouth daily at 12 noon.   furosemide 20 MG tablet Commonly known as:  LASIX Take 1 tablet (20 mg total) by mouth daily.   glimepiride 2 MG tablet Commonly known as:  AMARYL Take 0.5 tablets (1 mg total) by mouth daily.   glucose blood test strip Commonly known as:  ACCU-CHEK AVIVA PLUS USE AS INSTRUCTED TO CHECK BLOOD SUGAR TWICE A DAY DX:E11.65   HUMALOG 100 UNIT/ML injection Generic drug:  insulin lispro Inject 4 to 10 units into the skin three (3) times daily with meals as directed.   insulin NPH Human 100 UNIT/ML injection Commonly known as:  HUMULIN N,NOVOLIN N Inject 0.26 mLs (26 Units total) into the skin at bedtime. Uses Vial   Insulin Pen Needle 31G X 5 MM Misc Commonly known as:  B-D UF III MINI PEN NEEDLES USE 2 PEN NEEDLE PER DAY WITH HUMALOG AND VICTOZA   liraglutide 18 MG/3ML Sopn Commonly known as:  VICTOZA Inject 0.3 mLs (1.8 mg total) into the skin at bedtime.   metFORMIN 500 MG (MOD) 24 hr tablet Commonly known as:  GLUMETZA Take 1 tablet (500 mg total) by mouth 2 (two) times daily with a meal. (morning & noon)   multivitamin with minerals Tabs tablet Take 1 tablet by mouth daily.   olmesartan 40 MG tablet Commonly known as:  BENICAR Take 1 tablet (40 mg total) by mouth daily.   pantoprazole 40 MG tablet Commonly known as:  PROTONIX Take 1 tablet (40 mg total) by mouth 2 (two) times daily.   pravastatin 80 MG tablet Commonly known as:  PRAVACHOL Take 1 tablet (80 mg total) by mouth every evening.   PRESERVISION AREDS 2 PO Take 1 capsule by mouth 2 (two) times daily.   promethazine 12.5 MG tablet Commonly known as:  PHENERGAN Take 12.5 mg by mouth every 6 (six) hours as needed for nausea/vomiting.     tamsulosin 0.4 MG Caps capsule Commonly known as:  FLOMAX Take 1 capsule daily   tiZANidine 4 MG tablet Commonly known as:  ZANAFLEX Take 1 tablet (4 mg total) by mouth every 6 (six) hours as needed.   ULTICARE INSULIN SYRINGE 31G X 5/16" 1 ML Misc Generic drug:  Insulin Syringe-Needle U-100 USE TO INJECT INSULIN DAILY  vitamin C 500 MG tablet Commonly known as:  ASCORBIC ACID Take 500 mg by mouth daily.       Allergies:  Allergies  Allergen Reactions  . Bactrim [Sulfamethoxazole-Trimethoprim] Nausea And Vomiting  . Morphine And Related Nausea And Vomiting    Past Medical History:  Diagnosis Date  . Anemia   . Arthritis   . CAD (coronary artery disease)    a. cardiac cath 12/2016 showing moderate AS, elevated LVEDP, heavy 3V coronary calcification, 100% mD2, 30-50% LAD, 50-90% stenosis of Cx proximal to origin of L-PDA, RCA not engaged due to poor catheter control (difficult procedure) - coronary status was essentially unchanged from prior.  . Chronic diastolic CHF (congestive heart failure) ([place])   . Degenerative arthritis   . Diabetes ([place])   . Dyspnea   . Essential hypertension   . Hyperlipidemia   . Obesity (BMI 30-39.9) 06/18/2015  . OSA (obstructive sleep apnea)    Severe with AHI 27/hr now on CPAP  no cpap  . Peritonitis ([place]) 1985?  [name] Pneumonia    "6 months - 84 years old"  . Pure hypercholesterolemia   . RBBB   . [facility] spotted fever   . 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 Severe aortic stenosis    a. s/p TAVR 03/2017.  . Type II or unspecified type diabetes mellitus without mention of complication, not stated as uncontrolled     Past Surgical History:  Procedure Laterality Date  . APPENDECTOMY    . CARPAL TUNNEL RELEASE    . COLECTOMY     partial  . COLONOSCOPY WITH PROPOFOL N/A 11/18/2017   Procedure:  COLONOSCOPY WITH PROPOFOL;  Surgeon: Carol Ada, MD;  Location: [facility];  Service: Endoscopy;  Laterality: N/A;  . ESOPHAGOGASTRODUODENOSCOPY N/A 11/18/2017   Procedure: ESOPHAGOGASTRODUODENOSCOPY (EGD);  Surgeon: Carol Ada, MD;  Location: [facility];  Service: Endoscopy;  Laterality: N/A;  . EYE SURGERY Bilateral    cataracts removed, lens placed  . JOINT REPLACEMENT    . KNEE CARTILAGE SURGERY    . PARTIAL COLECTOMY    . RIGHT/LEFT HEART CATH AND CORONARY ANGIOGRAPHY N/A 12/28/2016   Procedure: Right/Left Heart Cath and Coronary Angiography;  Surgeon: Belva Crome, MD;  Location: [facility];  Service: Cardiovascular;  Laterality: N/A;  . TEE WITHOUT CARDIOVERSION N/A 03/15/2017   Procedure: TRANSESOPHAGEAL ECHOCARDIOGRAM (TEE);  Surgeon: Burnell Blanks, MD;  Location: [facility];  Service: Open Heart Surgery;  Laterality: N/A;  . TOTAL KNEE ARTHROPLASTY Right 11/08/2017  . TOTAL KNEE ARTHROPLASTY Right 11/08/2017   Procedure: TOTAL KNEE ARTHROPLASTY;  Surgeon: Melrose Nakayama, MD;  Location: [facility];  Service: Orthopedics;  Laterality: Right;  . TRANSCATHETER AORTIC VALVE REPLACEMENT, TRANSFEMORAL N/A 03/15/2017   Procedure: TRANSCATHETER AORTIC VALVE REPLACEMENT, TRANSFEMORAL;  Surgeon: Burnell Blanks, MD;  Location: [facility];  Service: Open Heart Surgery;  Laterality: N/A;  . VEIN SURGERY      Family History  Problem Relation Age of Onset  . Heart failure Father   . Emphysema Father   . Hypertension Mother   . Cancer Sister   . Healthy Sister     Social History:  reports that he has never smoked. He has never used smokeless tobacco. He reports that he does not drink alcohol or use drugs.  Review of Systems:  HYPERTENSION:   Currently being treated with Benicar 40  milligrams and 2.5 mg amlodipine Also followed up by a cardiologist periodically  He has not checked blood pressure at home again  BP Readings from Last 3 Encounters:  02/15/18 (!) 142/80    11/19/17 (!) 144/65  11/11/17 122/60    CARDIAC history:  No shortness of breath on exertion, this has improved since his aortic valve replacement  EDEMA: He is now only taking 30 or 40 mg Lasix, previously on as much as 80 mg twice daily takes a little less when he has less edema His cardiologist had told him to try taking 20 mg  HYPERLIPIDEMIA: The lipid abnormality consists of elevated LDL and he is taking 40 mg Pravachol, no side effects. LDL is 91 on the last measurement  Lab Results  Component Value Date   CHOL 127 02/10/2018   HDL 46.30 02/10/2018   LDLCALC 60 02/10/2018   LDLDIRECT 101.0 04/26/2016   TRIG 104.0 02/10/2018   CHOLHDL 3 02/10/2018    DAYTIME somnolence:  He is having more difficulty with daytime somnolence and falling asleep easily even while in the middle of activities Currently not driving He has stopped using the CPAP because of difficulty with compliance more recently and has not followed up with his sleep disorder specialist for a couple of years His wife is asking about testing and treating for narcolepsy    IRON deficiency anemia:   His hemoglobin tends to be mildly decreased consistently However he was severely anemic when he was admitted to the hospital in March and has not had a follow-up He is usually trying to take his iron tablet regularly  No history of B-12 deficiency   CBC Latest Ref Rng & Units 11/19/2017 11/18/2017 11/17/2017  WBC 4.0 - 10.5 K/uL 10.9(H) 10.4 12.1(H)  Hemoglobin 13.0 - 17.0 g/dL 8.3(L) 8.8(L) 8.9(L)  Hematocrit 39.0 - 52.0 % 25.2(L) 26.6(L) 26.7(L)  Platelets 150 - 400 K/uL 285 286 278   Foot exam last done in 04/2017  ROS    Physical Exam    BP (!) 142/80 (BP Location: Left Arm, Patient Position: Sitting, Cuff Size: Normal)   Pulse 80   Ht 5\' 8"  (1.727 m)   Wt 235 lb 9.6 oz (106.9 kg)   SpO2 98%   BMI 35.82 kg/m   He has 3+ right leg and 2+ left lower leg edema Lungs clear   ASSESSMENT/ PLAN:    Diabetes type 2 with obesity  See history of present illness for detailed discussion of his current management, blood sugar patterns and problems identified  His A1c is better at 6.9 although may be falsely low because of anemia His blood sugars are high after supper because of not taking Humalog with his main meal Blood sugars after supper are averaging 200+ and he is not proactively taking Humalog as discussed previously He will start at least 6 units at  suppertime and adjust up or down 2 units for larger or smaller meals with variable carbohydrate intake  He may stop his Amaryl as this is ineffective Also with fasting readings being low normal he needs to reduce his NPH by 4 units Discussed making sure he is having clean and dry fingers when he is testing his blood sugar Continue Victoza  SOMNOLENCE: Not clear if he has sleep apnea or element of narcolepsy He does need to follow-up with sleep disorder specialist for evaluation as not clear if he is able to use his CPAP and not clear if he needs to be empirically treated  for narcolepsy Also discussed that if he is interested we can send him to a pulmonologist for another opinion   HYPERTENSION: Blood pressure is variable but recently better  EDEMA: This is not controlled.  He needs to increase Lasix to at least 60 mg alternating with 40 mg until his next visit  ANEMIA: He has not had follow-up since his hospitalization for bleeding after his knee surgery and will check this today    [name] 02/15/2018, 1:25 PM   Addendum: Message for the patient as follows:His iron level is still low, needs to increase iron to twice a day with food and vitamin C at the same time  Lab Results  Component Value Date   HGB 10.5 (L) 02/15/2018

## 2018-03-03 ENCOUNTER — Encounter: Payer: Self-pay | Admitting: Physician Assistant

## 2018-03-03 ENCOUNTER — Other Ambulatory Visit: Payer: Self-pay | Admitting: Endocrinology

## 2018-03-04 ENCOUNTER — Other Ambulatory Visit: Payer: Self-pay

## 2018-03-04 ENCOUNTER — Inpatient Hospital Stay (HOSPITAL_BASED_OUTPATIENT_CLINIC_OR_DEPARTMENT_OTHER)
Admission: EM | Admit: 2018-03-04 | Discharge: 2018-03-07 | DRG: 308 | Disposition: A | Discharge status: Home / Self Care | Payer: Medicare Other | Attending: Interventional Cardiology | Admitting: Interventional Cardiology

## 2018-03-04 ENCOUNTER — Encounter (HOSPITAL_BASED_OUTPATIENT_CLINIC_OR_DEPARTMENT_OTHER): Payer: Self-pay | Admitting: Emergency Medicine

## 2018-03-04 ENCOUNTER — Emergency Department (HOSPITAL_BASED_OUTPATIENT_CLINIC_OR_DEPARTMENT_OTHER): Payer: Medicare Other

## 2018-03-04 DIAGNOSIS — Z7902 Long term (current) use of antithrombotics/antiplatelets: Secondary | ICD-10-CM | POA: Diagnosis not present

## 2018-03-04 DIAGNOSIS — N179 Acute kidney failure, unspecified: Secondary | ICD-10-CM | POA: Diagnosis present

## 2018-03-04 DIAGNOSIS — I2510 Atherosclerotic heart disease of native coronary artery without angina pectoris: Secondary | ICD-10-CM | POA: Diagnosis present

## 2018-03-04 DIAGNOSIS — I4510 Unspecified right bundle-branch block: Secondary | ICD-10-CM | POA: Diagnosis not present

## 2018-03-04 DIAGNOSIS — G4733 Obstructive sleep apnea (adult) (pediatric): Secondary | ICD-10-CM | POA: Diagnosis not present

## 2018-03-04 DIAGNOSIS — I110 Hypertensive heart disease with heart failure: Secondary | ICD-10-CM | POA: Diagnosis present

## 2018-03-04 DIAGNOSIS — I4819 Other persistent atrial fibrillation: Secondary | ICD-10-CM | POA: Diagnosis present

## 2018-03-04 DIAGNOSIS — Z79899 Other long term (current) drug therapy: Secondary | ICD-10-CM | POA: Diagnosis not present

## 2018-03-04 DIAGNOSIS — Z794 Long term (current) use of insulin: Secondary | ICD-10-CM | POA: Diagnosis not present

## 2018-03-04 DIAGNOSIS — I10 Essential (primary) hypertension: Secondary | ICD-10-CM

## 2018-03-04 DIAGNOSIS — Z885 Allergy status to narcotic agent status: Secondary | ICD-10-CM | POA: Diagnosis not present

## 2018-03-04 DIAGNOSIS — I4891 Unspecified atrial fibrillation: Secondary | ICD-10-CM

## 2018-03-04 DIAGNOSIS — E8770 Fluid overload, unspecified: Secondary | ICD-10-CM | POA: Diagnosis not present

## 2018-03-04 DIAGNOSIS — I509 Heart failure, unspecified: Secondary | ICD-10-CM

## 2018-03-04 DIAGNOSIS — E785 Hyperlipidemia, unspecified: Secondary | ICD-10-CM | POA: Diagnosis not present

## 2018-03-04 DIAGNOSIS — Q211 Atrial septal defect: Secondary | ICD-10-CM | POA: Diagnosis not present

## 2018-03-04 DIAGNOSIS — Z953 Presence of xenogenic heart valve: Secondary | ICD-10-CM | POA: Diagnosis not present

## 2018-03-04 DIAGNOSIS — E119 Type 2 diabetes mellitus without complications: Secondary | ICD-10-CM | POA: Diagnosis present

## 2018-03-04 DIAGNOSIS — Z8679 Personal history of other diseases of the circulatory system: Secondary | ICD-10-CM | POA: Diagnosis not present

## 2018-03-04 DIAGNOSIS — Z7982 Long term (current) use of aspirin: Secondary | ICD-10-CM

## 2018-03-04 DIAGNOSIS — M1990 Unspecified osteoarthritis, unspecified site: Secondary | ICD-10-CM | POA: Diagnosis not present

## 2018-03-04 DIAGNOSIS — Z8711 Personal history of peptic ulcer disease: Secondary | ICD-10-CM | POA: Diagnosis not present

## 2018-03-04 DIAGNOSIS — Z882 Allergy status to sulfonamides status: Secondary | ICD-10-CM | POA: Diagnosis not present

## 2018-03-04 DIAGNOSIS — I5043 Acute on chronic combined systolic (congestive) and diastolic (congestive) heart failure: Secondary | ICD-10-CM | POA: Diagnosis present

## 2018-03-04 DIAGNOSIS — I071 Rheumatic tricuspid insufficiency: Secondary | ICD-10-CM | POA: Diagnosis present

## 2018-03-04 DIAGNOSIS — Z8249 Family history of ischemic heart disease and other diseases of the circulatory system: Secondary | ICD-10-CM

## 2018-03-04 DIAGNOSIS — I480 Paroxysmal atrial fibrillation: Secondary | ICD-10-CM | POA: Diagnosis present

## 2018-03-04 HISTORY — DX: Unspecified atrial fibrillation: I48.91

## 2018-03-04 LAB — CBC WITH DIFFERENTIAL/PLATELET
Basophils Absolute: 0 10*3/uL (ref 0.0–0.1)
Basophils Relative: 0 %
EOS ABS: 0.2 10*3/uL (ref 0.0–0.7)
EOS PCT: 2 %
HCT: 30.4 % — ABNORMAL LOW (ref 39.0–52.0)
Hemoglobin: 9.8 g/dL — ABNORMAL LOW (ref 13.0–17.0)
LYMPHS ABS: 1.9 10*3/uL (ref 0.7–4.0)
LYMPHS PCT: 26 %
MCH: 28.7 pg (ref 26.0–34.0)
MCHC: 32.2 g/dL (ref 30.0–36.0)
MCV: 88.9 fL (ref 78.0–100.0)
MONO ABS: 0.9 10*3/uL (ref 0.1–1.0)
MONOS PCT: 12 %
Neutro Abs: 4.3 10*3/uL (ref 1.7–7.7)
Neutrophils Relative %: 60 %
PLATELETS: 170 10*3/uL (ref 150–400)
RBC: 3.42 MIL/uL — ABNORMAL LOW (ref 4.22–5.81)
RDW: 16.3 % — ABNORMAL HIGH (ref 11.5–15.5)
WBC: 7.2 10*3/uL (ref 4.0–10.5)

## 2018-03-04 LAB — COMPREHENSIVE METABOLIC PANEL
ALK PHOS: 70 U/L (ref 38–126)
ALT: 15 U/L (ref 0–44)
ANION GAP: 7 (ref 5–15)
AST: 21 U/L (ref 15–41)
Albumin: 3.7 g/dL (ref 3.5–5.0)
BUN: 33 mg/dL — ABNORMAL HIGH (ref 8–23)
CALCIUM: 8.7 mg/dL — AB (ref 8.9–10.3)
CHLORIDE: 106 mmol/L (ref 98–111)
CO2: 24 mmol/L (ref 22–32)
CREATININE: 1.23 mg/dL (ref 0.61–1.24)
GFR, EST NON AFRICAN AMERICAN: 53 mL/min — AB (ref >60)
Glucose, Bld: 157 mg/dL — ABNORMAL HIGH (ref 70–99)
Potassium: 4.4 mmol/L (ref 3.5–5.1)
SODIUM: 137 mmol/L (ref 135–145)
Total Bilirubin: 0.4 mg/dL (ref 0.3–1.2)
Total Protein: 6.7 g/dL (ref 6.5–8.1)

## 2018-03-04 LAB — GLUCOSE, CAPILLARY: GLUCOSE-CAPILLARY: 156 mg/dL — AB (ref 70–99)

## 2018-03-04 LAB — TROPONIN I: Troponin I: 0.03 ng/mL (ref <0.03)

## 2018-03-04 LAB — BRAIN NATRIURETIC PEPTIDE: B NATRIURETIC PEPTIDE 5: 261.9 pg/mL — AB (ref 0.0–100.0)

## 2018-03-04 MED ORDER — METFORMIN HCL ER 500 MG PO TB24
500.0000 mg | ORAL_TABLET | Freq: Two times a day (BID) | ORAL | Status: DC
  Administered 2018-03-05 – 2018-03-07 (×5): 500 mg via ORAL
  Filled 2018-03-04 (×6): qty 1

## 2018-03-04 MED ORDER — GLIMEPIRIDE 1 MG PO TABS
1.0000 mg | ORAL_TABLET | Freq: Every day | ORAL | Status: DC
  Administered 2018-03-05 – 2018-03-07 (×3): 1 mg via ORAL
  Filled 2018-03-04 (×3): qty 1

## 2018-03-04 MED ORDER — IRBESARTAN 300 MG PO TABS
300.0000 mg | ORAL_TABLET | Freq: Every day | ORAL | Status: DC
  Administered 2018-03-05 – 2018-03-07 (×3): 300 mg via ORAL
  Filled 2018-03-04 (×3): qty 1

## 2018-03-04 MED ORDER — PRAVASTATIN SODIUM 40 MG PO TABS
80.0000 mg | ORAL_TABLET | Freq: Every evening | ORAL | Status: DC
  Administered 2018-03-05 – 2018-03-06 (×2): 80 mg via ORAL
  Filled 2018-03-04 (×2): qty 2

## 2018-03-04 MED ORDER — ADULT MULTIVITAMIN W/MINERALS CH
1.0000 | ORAL_TABLET | Freq: Every day | ORAL | Status: DC
  Administered 2018-03-05 – 2018-03-07 (×3): 1 via ORAL
  Filled 2018-03-04 (×3): qty 1

## 2018-03-04 MED ORDER — FERROUS SULFATE 325 (65 FE) MG PO TABS
325.0000 mg | ORAL_TABLET | Freq: Every day | ORAL | Status: DC
  Administered 2018-03-05 – 2018-03-07 (×3): 325 mg via ORAL
  Filled 2018-03-04 (×3): qty 1

## 2018-03-04 MED ORDER — INSULIN NPH (HUMAN) (ISOPHANE) 100 UNIT/ML ~~LOC~~ SUSP
26.0000 [IU] | Freq: Every day | SUBCUTANEOUS | Status: DC
  Administered 2018-03-05 – 2018-03-06 (×2): 26 [IU] via SUBCUTANEOUS
  Filled 2018-03-04: qty 10

## 2018-03-04 MED ORDER — AMLODIPINE BESYLATE 2.5 MG PO TABS
2.5000 mg | ORAL_TABLET | Freq: Every day | ORAL | Status: DC
  Administered 2018-03-05 – 2018-03-07 (×3): 2.5 mg via ORAL
  Filled 2018-03-04 (×3): qty 1

## 2018-03-04 MED ORDER — OMEGA-3-ACID ETHYL ESTERS 1 G PO CAPS
1.0000 g | ORAL_CAPSULE | Freq: Every day | ORAL | Status: DC
  Administered 2018-03-05 – 2018-03-06 (×2): 1 g via ORAL
  Filled 2018-03-04 (×2): qty 1

## 2018-03-04 MED ORDER — ONDANSETRON HCL 4 MG/2ML IJ SOLN: 4.0000 mg | Freq: Four times a day (QID) | INTRAMUSCULAR | Status: DC | PRN

## 2018-03-04 MED ORDER — PANTOPRAZOLE SODIUM 40 MG PO TBEC
40.0000 mg | DELAYED_RELEASE_TABLET | Freq: Two times a day (BID) | ORAL | Status: DC
  Administered 2018-03-05 – 2018-03-07 (×5): 40 mg via ORAL
  Filled 2018-03-04 (×5): qty 1

## 2018-03-04 MED ORDER — VITAMIN C 500 MG PO TABS
500.0000 mg | ORAL_TABLET | Freq: Every day | ORAL | Status: DC
  Administered 2018-03-05 – 2018-03-07 (×3): 500 mg via ORAL
  Filled 2018-03-04 (×3): qty 1

## 2018-03-04 MED ORDER — FUROSEMIDE 10 MG/ML IJ SOLN
60.0000 mg | INTRAMUSCULAR | Status: AC
  Administered 2018-03-04: 60 mg via INTRAVENOUS
  Filled 2018-03-04: qty 6

## 2018-03-04 MED ORDER — INSULIN ASPART 100 UNIT/ML ~~LOC~~ SOLN
0.0000 [IU] | Freq: Three times a day (TID) | SUBCUTANEOUS | Status: DC
  Administered 2018-03-05 – 2018-03-06 (×4): 4 [IU] via SUBCUTANEOUS
  Administered 2018-03-06 – 2018-03-07 (×2): 3 [IU] via SUBCUTANEOUS

## 2018-03-04 MED ORDER — FUROSEMIDE 10 MG/ML IJ SOLN
40.0000 mg | Freq: Two times a day (BID) | INTRAMUSCULAR | Status: DC
Start: 2018-03-05 — End: 2018-03-06
  Administered 2018-03-05 – 2018-03-06 (×3): 40 mg via INTRAVENOUS
  Filled 2018-03-04 (×3): qty 4

## 2018-03-04 MED ORDER — ACETAMINOPHEN 500 MG PO TABS
500.0000 mg | ORAL_TABLET | ORAL | Status: DC | PRN
  Administered 2018-03-04: 500 mg via ORAL
  Filled 2018-03-04: qty 1

## 2018-03-04 MED ORDER — TAMSULOSIN HCL 0.4 MG PO CAPS
0.4000 mg | ORAL_CAPSULE | Freq: Every day | ORAL | Status: DC
  Administered 2018-03-05 – 2018-03-07 (×3): 0.4 mg via ORAL
  Filled 2018-03-04 (×3): qty 1

## 2018-03-04 MED ORDER — INSULIN ASPART 100 UNIT/ML ~~LOC~~ SOLN: 0.0000 [IU] | Freq: Every day | SUBCUTANEOUS | Status: DC

## 2018-03-04 MED ORDER — ASPIRIN EC 81 MG PO TBEC
81.0000 mg | DELAYED_RELEASE_TABLET | Freq: Every day | ORAL | Status: DC
  Administered 2018-03-05: 81 mg via ORAL
  Filled 2018-03-04: qty 1

## 2018-03-04 MED ORDER — CLOPIDOGREL BISULFATE 75 MG PO TABS
75.0000 mg | ORAL_TABLET | Freq: Every day | ORAL | Status: DC
  Administered 2018-03-05: 75 mg via ORAL
  Filled 2018-03-04: qty 1

## 2018-03-04 NOTE — ED Triage Notes (Signed)
Bilateral leg swelling and redness x 1 week with SOB.

## 2018-03-04 NOTE — ED Notes (Signed)
Pt refused wheelchair to and from xray

## 2018-03-04 NOTE — ED Notes (Signed)
Room assignment given to pt and family. Updated on plan for transport

## 2018-03-04 NOTE — ED Provider Notes (Addendum)
[place] EMERGENCY DEPARTMENT Provider Note   CSN: [id number] Arrival date & time: 03/04/18  1302     History   Chief Complaint Chief Complaint  Patient presents with  . Leg Swelling  . Shortness of Breath    HPI Todd Romero is a 84 y.o. male who presents from [facility] urgent care with chief complaint of leg swelling and shortness of breath.  He has a past medical history of CHF, three-vessel CAD, diabetes, aortic stenosis with valve replacement.  Patient is on Plavix.  Patient states that he has had increased swelling in the lower extremities are past several weeks and has noticed that he has been extremely tired with exertional dyspnea and wheezing when he lies flat.  He has had a 20 pound weight gain over the past several days.  The patient is on alternating doses of Lasix 40 mg then 60 mg every other day.  The patient was seen at the urgent care earlier today and the provider was concerned for cellulitis of the lower extremities giving some mild erythema.  He denies any chest pain, racing or skipping. Patient was recently in the hospital in March 2019 for GI bleed that showed multiple duodenal and gastric ulcers along with esophagitis.  Patient melena or hematochezia.  HPI  Past Medical History:  Diagnosis Date  . Anemia   . Arthritis   . CAD (coronary artery disease)    a. cardiac cath 12/2016 showing moderate AS, elevated LVEDP, heavy 3V coronary calcification, 100% mD2, 30-50% LAD, 50-90% stenosis of Cx proximal to origin of L-PDA, RCA not engaged due to poor catheter control (difficult procedure) - coronary status was essentially unchanged from prior.  . Chronic diastolic CHF (congestive heart failure) ([place])   . Degenerative arthritis   . Diabetes ([place])   . Dyspnea   . Essential hypertension   . Hyperlipidemia   . Obesity (BMI 30-39.9) 06/18/2015  . OSA (obstructive sleep apnea)    Severe with AHI 27/hr now on CPAP  no cpap  . Peritonitis ([place]) 1985?   [name] Pneumonia    "6 months - 84 years old"  . Pure hypercholesterolemia   . RBBB   . [facility] spotted fever   . 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 Severe aortic stenosis    a. s/p TAVR 03/2017.  . Type II or unspecified type diabetes mellitus without mention of complication, not stated as uncontrolled     Patient Active Problem List   Diagnosis Date Noted  . 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 Acute on chronic combined systolic and diastolic HF (heart failure) ([place]) 03/27/2018  . Acute on chronic congestive heart failure ([place])   . New onset a-fib ([place]) 03/04/2018  . A-fib ([place]) 03/04/2018  . GIB (gastrointestinal bleeding) 11/18/2017  . Anemia 11/16/2017  . Heme positive stool 11/16/2017  . Leukocytosis 11/16/2017  . 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 Chronic diastolic heart failure ([place]) 12/18/2015  . Aortic stenosis 12/18/2015  . Obesity (BMI 30-39.9) 06/18/2015  . Obstructive sleep apnea 02/11/2015  . Dyspnea on exertion 02/11/2015  . Primary osteoarthritis of right knee 03/18/2014  . Essential hypertension, benign 06/21/2013    Class: Chronic  . RBBB   . CAD (coronary artery disease)     Class: Chronic  . Hyperlipidemia   . Type II or unspecified type diabetes mellitus  without mention of complication, not stated as uncontrolled 05/02/2013    Past Surgical History:  Procedure Laterality Date  . APPENDECTOMY    . CARDIOVERSION N/A 04/03/2018   Procedure: CARDIOVERSION;  Surgeon: Josue Hector, MD;  Location: [facility] ENDOSCOPY;  Service: Cardiovascular;  Laterality: N/A;  . CARPAL TUNNEL RELEASE    . COLECTOMY     partial  . COLONOSCOPY WITH PROPOFOL N/A 11/18/2017   Procedure: COLONOSCOPY WITH PROPOFOL;  Surgeon: Carol Ada, MD;  Location: [facility] ENDOSCOPY;  Service: Endoscopy;  Laterality: N/A;  .  ESOPHAGOGASTRODUODENOSCOPY N/A 11/18/2017   Procedure: ESOPHAGOGASTRODUODENOSCOPY (EGD);  Surgeon: Carol Ada, MD;  Location: [facility] ENDOSCOPY;  Service: Endoscopy;  Laterality: N/A;  . EYE SURGERY Bilateral    cataracts removed, lens placed  . JOINT REPLACEMENT    . KNEE CARTILAGE SURGERY    . PARTIAL COLECTOMY    . RIGHT/LEFT HEART CATH AND CORONARY ANGIOGRAPHY N/A 12/28/2016   Procedure: Right/Left Heart Cath and Coronary Angiography;  Surgeon: Belva Crome, MD;  Location: [place] CV LAB;  Service: Cardiovascular;  Laterality: N/A;  . TEE WITHOUT CARDIOVERSION N/A 03/15/2017   Procedure: TRANSESOPHAGEAL ECHOCARDIOGRAM (TEE);  Surgeon: Burnell Blanks, MD;  Location: [place];  Service: Open Heart Surgery;  Laterality: N/A;  . TOTAL KNEE ARTHROPLASTY Right 11/08/2017  . TOTAL KNEE ARTHROPLASTY Right 11/08/2017   Procedure: TOTAL KNEE ARTHROPLASTY;  Surgeon: Melrose Nakayama, MD;  Location: [place];  Service: Orthopedics;  Laterality: Right;  . TRANSCATHETER AORTIC VALVE REPLACEMENT, TRANSFEMORAL N/A 03/15/2017   Procedure: TRANSCATHETER AORTIC VALVE REPLACEMENT, TRANSFEMORAL;  Surgeon: Burnell Blanks, MD;  Location: [place];  Service: Open Heart Surgery;  Laterality: N/A;  . VEIN SURGERY          Home Medications    Prior to Admission medications   Medication Sig Start Date End Date Taking? Authorizing Provider  acetaminophen (TYLENOL) 500 MG tablet Take 500 mg by mouth every 4 (four) hours as needed for moderate pain or headache.   Yes [provider]  ferrous sulfate 325 (65 FE) MG tablet Take 325 mg by mouth 2 (two) times daily with a meal.    Yes [provider]  fish oil-omega-3 fatty acids 1000 MG capsule Take 1 g by mouth daily at 12 noon.    Yes [provider]  insulin lispro (HUMALOG) 100 UNIT/ML injection Inject 10 Units into the skin daily. Take with largest meal   Yes [provider]  insulin NPH Human (HUMULIN N,NOVOLIN N) 100  UNIT/ML injection Inject 0.26 mLs (26 Units total) into the skin at bedtime. Uses Vial Patient taking differently: Inject 26 Units into the skin at bedtime.  08/19/17  Yes [name], MD  metFORMIN (GLUMETZA) 500 MG (MOD) 24 hr tablet Take 1 tablet (500 mg total) by mouth 2 (two) times daily with a meal. (morning & noon) 05/06/17  Yes [name], MD  Multiple Vitamin (MULTIVITAMIN WITH MINERALS) TABS tablet Take 1 tablet by mouth daily. Mens One a day Vit   Yes [provider]  Multiple Vitamins-Minerals (PRESERVISION AREDS 2 PO) Take 1 capsule by mouth 2 (two) times daily.   Yes [provider]  pantoprazole (PROTONIX) 40 MG tablet Take 1 tablet (40 mg total) by mouth 2 (two) times daily. 11/19/17  Yes Sheikh, Omair Jerusalem, DO  VICTOZA 18 MG/3ML SOPN INJECT 1.8 MG UNDER THE SKIN AT BEDTIME 03/04/18  Yes [name], MD  vitamin C (ASCORBIC ACID) 500 MG tablet Take 500 mg  by mouth daily.   Yes [provider]  apixaban (ELIQUIS) 5 MG TABS tablet Take 1 tablet (5 mg total) by mouth 2 (two) times daily. 03/07/18   Cheryln Manly, NP  furosemide (LASIX) 80 MG tablet Take 1 tablet (80 mg total) by mouth 2 (two) times daily. Take an extra half tablet (40 mg) if weight goes up 2 pounds or more from day before. 04/04/18 04/04/19  Daune Perch, NP  insulin lispro (HUMALOG KWIKPEN) 100 UNIT/ML KiwkPen INJECT 4-8 UNITS BEFORE THE MAIN MEALS OF THE DAY 04/26/18   [name], MD  Insulin Pen Needle (B-D UF III MINI PEN NEEDLES) 31G X 5 MM MISC USE 2 PEN NEEDLE PER DAY WITH HUMALOG AND VICTOZA 04/26/18   [name], MD  olmesartan (BENICAR) 20 MG tablet Take 1 tablet (20 mg total) by mouth daily. 04/21/18   Belva Crome, MD  pravastatin (PRAVACHOL) 80 MG tablet Take 1 tablet (80 mg total) by mouth every evening. 04/12/18   Renato Shin, MD  tamsulosin [facility]) 0.4 MG CAPS capsule Take 1 capsule daily 04/12/18   Renato Shin, MD    Family History Family History  Problem Relation Age  of Onset  . Heart failure Father   . Emphysema Father   . Hypertension Mother   . Cancer Sister   . Healthy Sister     Social History Social History   Tobacco Use  . Smoking status: Never Smoker  . Smokeless tobacco: Never Used  Substance Use Topics  . Alcohol use: No  . Drug use: No     Allergies   Bactrim [sulfamethoxazole-trimethoprim] and Morphine and related   Review of Systems Review of Systems Ten systems reviewed and are negative for acute change, except as noted in the HPI.    Physical Exam Updated Vital Signs BP 126/65 (BP Location: Left Arm)   Pulse 66   Temp 98.1 F (36.7 C) (Oral)   Resp 19   Ht 5\' 8"  (1.727 m)   Wt 106.3 kg   SpO2 95%   BMI 35.63 kg/m   Physical Exam  Constitutional: He appears well-developed and well-nourished. No distress.  HENT:  Head: Normocephalic and atraumatic.  Eyes: Pupils are equal, round, and reactive to light. Conjunctivae and EOM are normal. No scleral icterus.  Neck: Normal range of motion. Neck supple.  Cardiovascular: Normal rate and normal heart sounds.  Irregularly irregular rhythm  Pulmonary/Chest: Effort normal and breath sounds normal. No respiratory distress.  Abdominal: Soft. There is no tenderness.  Musculoskeletal:       Right lower leg: He exhibits edema.       Left lower leg: He exhibits edema.  BL 3+ pitting edema.  Mild erythema without evidence of infection. No heat redness or tenderness  Neurological: He is alert.  Skin: Skin is warm and dry. He is not diaphoretic.  Psychiatric: His behavior is normal.  Nursing note and vitals reviewed.    ED Treatments / Results  Labs (all labs ordered are listed, but only abnormal results are displayed) Labs Reviewed  CBC WITH DIFFERENTIAL/PLATELET - Abnormal; Notable for the following components:      Result Value   RBC 3.42 (*)    Hemoglobin 9.8 (*)    HCT 30.4 (*)    RDW 16.3 (*)    All other components within normal limits  COMPREHENSIVE  METABOLIC PANEL - Abnormal; Notable for the following components:   Glucose, Bld 157 (*)    BUN 33 (*)  Calcium 8.7 (*)    GFR calc non Af Amer 53 (*)    All other components within normal limits  BRAIN NATRIURETIC PEPTIDE - Abnormal; Notable for the following components:   B Natriuretic Peptide 261.9 (*)    All other components within normal limits  TROPONIN I - Abnormal; Notable for the following components:   Troponin I 0.03 (*)    All other components within normal limits  GLUCOSE, CAPILLARY - Abnormal; Notable for the following components:   Glucose-Capillary 156 (*)    All other components within normal limits  TROPONIN I - Abnormal; Notable for the following components:   Troponin I 0.04 (*)    All other components within normal limits  TROPONIN I - Abnormal; Notable for the following components:   Troponin I 0.03 (*)    All other components within normal limits  TROPONIN I - Abnormal; Notable for the following components:   Troponin I 0.03 (*)    All other components within normal limits  CBC - Abnormal; Notable for the following components:   RBC 3.35 (*)    Hemoglobin 9.4 (*)    HCT 30.5 (*)    RDW 16.4 (*)    All other components within normal limits  GLUCOSE, CAPILLARY - Abnormal; Notable for the following components:   Glucose-Capillary 172 (*)    All other components within normal limits  GLUCOSE, CAPILLARY - Abnormal; Notable for the following components:   Glucose-Capillary 156 (*)    All other components within normal limits  BASIC METABOLIC PANEL - Abnormal; Notable for the following components:   Glucose, Bld 154 (*)    BUN 26 (*)    Creatinine, Ser 1.32 (*)    Calcium 8.8 (*)    GFR calc non Af Amer 49 (*)    GFR calc Af Amer 57 (*)    All other components within normal limits  CBC - Abnormal; Notable for the following components:   RBC 3.39 (*)    Hemoglobin 9.6 (*)    HCT 30.7 (*)    RDW 16.3 (*)    All other components within normal limits    GLUCOSE, CAPILLARY - Abnormal; Notable for the following components:   Glucose-Capillary 172 (*)    All other components within normal limits  GLUCOSE, CAPILLARY - Abnormal; Notable for the following components:   Glucose-Capillary 192 (*)    All other components within normal limits  GLUCOSE, CAPILLARY - Abnormal; Notable for the following components:   Glucose-Capillary 147 (*)    All other components within normal limits  GLUCOSE, CAPILLARY - Abnormal; Notable for the following components:   Glucose-Capillary 170 (*)    All other components within normal limits  GLUCOSE, CAPILLARY - Abnormal; Notable for the following components:   Glucose-Capillary 118 (*)    All other components within normal limits  BASIC METABOLIC PANEL - Abnormal; Notable for the following components:   Glucose, Bld 118 (*)    BUN 26 (*)    Creatinine, Ser 1.34 (*)    Calcium 8.8 (*)    GFR calc non Af Amer 48 (*)    GFR calc Af Amer 56 (*)    All other components within normal limits  CBC - Abnormal; Notable for the following components:   RBC 3.49 (*)    Hemoglobin 9.9 (*)    HCT 31.5 (*)    RDW 16.3 (*)    All other components within normal limits  GLUCOSE, CAPILLARY - Abnormal; Notable  for the following components:   Glucose-Capillary 183 (*)    All other components within normal limits  GLUCOSE, CAPILLARY - Abnormal; Notable for the following components:   Glucose-Capillary 145 (*)    All other components within normal limits  TSH  PROTIME-INR  MAGNESIUM  GLUCOSE, CAPILLARY    EKG EKG Interpretation  Date/Time:  Saturday March 04 2018 13:11:09 EDT Ventricular Rate:  63 PR Interval:    QRS Duration: 150 QT Interval:  448 QTC Calculation: 458 R Axis:   -22 Text Interpretation:  Atrial fibrillation Non-specific intra-ventricular conduction block Abnormal ECG No STEMI  Confirmed by Nanda Quinton [phone]) on 03/05/2018 9:13:46 AM   ED ECG REPORT   Date: 03/04/2018  Rate: 63  Rhythm:  atrial fibrillation  QRS Axis: normal  Intervals: normal  ST/T Wave abnormalities: nonspecific ST changes  Conduction Disutrbances:nonspecific intraventricular conduction delay  Narrative Interpretation:   Old EKG Reviewed: changes noted  I have personally reviewed the EKG tracing and agree with the computerized printout as noted.  Radiology No results found.  Procedures .Critical Care Performed by: Margarita Mail, PA-C Authorized by: Margarita Mail, PA-C   Critical care provider statement:    Critical care time (minutes):  70   Critical care was time spent personally by me on the following activities:  Discussions with consultants, evaluation of patient's response to treatment, examination of patient, ordering and performing treatments and interventions, ordering and review of laboratory studies, ordering and review of radiographic studies, pulse oximetry, re-evaluation of patient's condition, obtaining history from patient or surrogate and review of old charts   (including critical care time)  Medications Ordered in ED Medications  furosemide (LASIX) injection 60 mg (60 mg Intravenous Given 03/04/18 1533)     Initial Impression / Assessment and Plan / ED Course  I have reviewed the triage vital signs and the nursing notes.  Pertinent labs & imaging results that were available during my care of the patient were reviewed by me and considered in my medical decision making (see chart for details).  Clinical Course as of Apr 26 1138  Sat Mar 04, 2018  1453 Brain natriuretic peptide(!) [AH]  1455 Creatinine: 1.23 [AH]  1620 I spoke with Dr. Harl Bowie who accepted the patient to the Cardiology service.    [AH]    Clinical Course User Index [AH] Margarita Mail, PA-C   Patient presents with c/o SOB. The emergent differential diagnosis for shortness of breath includes, but is not limited to, Pulmonary edema, bronchoconstriction, Pneumonia, Pulmonary embolism, Pneumothorax/  Hemothorax, Dysrhythmia, ACS.  Review of EKG shows rate controlled Afib which is new. I have reviewed the CXR which shows pulmonary intersticial edema consistent with the patients clinical pictre and does not appear to show pneumonia, pneumothorax, pleural effusion. I agree  with the radiologists interpretation.   3:59 PM BP (!) 159/69   Pulse 61   Temp 97.9 F (36.6 C) (Oral)   Resp 19   Ht 5\' 8"  (1.727 m)   Wt 113.9 kg (251 lb)   SpO2 (!) 87%   BMI 38.16 kg/m  Patient with new onset rate controlled atrial fibrillation.  Unknown when it began.  Patient is not showing any neurologic abnormalities.  Given his recent GI bleed with known gastric and duodenal ulcers I am hesitant to begin heparinizing the patient.  Placed a call to cardiology.  I do think he has some volume overload and as well and I have begun diuresing the patient.  Patient's troponin is elevated  but this appears to be chronic.    4:15 PM patient accepted by Dr. Harl Bowie to the  Telemetry floor. He asks that we do not anticoagulate the patient.   11:39 AM BP 126/65 (BP Location: Left Arm)   Pulse 66   Temp 98.1 F (36.7 C) (Oral)   Resp 19   Ht 5\' 8"  (1.727 m)   Wt 106.3 kg   SpO2 95%   BMI 35.63 kg/m / No orders were placed by Dr. Harl Bowie. I was told by our secretary that bed control needed orders. I spoke with Dr. Rudean Curt to let her know that I had placed temporary holding orders on the patient for New onset afib, Chf exacerbation, recent hx of GI Bleed.     Final Clinical Impressions(s) / ED Diagnoses   Final diagnoses:  New onset a-fib ([place])  Acute on chronic congestive heart failure, unspecified heart failure type [facility])    ED Discharge Orders         Ordered    furosemide (LASIX) 80 MG tablet  Daily,   Status:  Discontinued     03/07/18 1327    apixaban (ELIQUIS) 5 MG TABS tablet  2 times daily     03/07/18 1327    Increase activity slowly     03/07/18 1354    Diet - low sodium heart  healthy     03/07/18 1354    Discharge instructions    Comments:  For patients with congestive heart failure, we give them these special instructions:  1. Follow a low-salt diet and watch your fluid intake. In general, you should not be taking in more than 2 liters of fluid per day (no more than 8 glasses per day). Some patients are restricted to less than 1.5 liters of fluid per day (no more than 6 glasses per day). This includes sources of water in foods like soup, coffee, tea, milk, etc. 2. Weigh yourself on the same scale at same time of day and keep a log. 3. Call your doctor: (Anytime you feel any of the following symptoms)  - 3-4 pound weight gain in 1-2 days or 2 pounds overnight  - Shortness of breath, with or without a dry hacking cough  - Swelling in the hands, feet or stomach  - If you have to sleep on extra pillows at night in order to breathe   IT IS IMPORTANT TO LET YOUR DOCTOR KNOW EARLY ON IF YOU ARE HAVING SYMPTOMS SO WE CAN HELP YOU!   03/07/18 1354           Margarita Mail, PA-C 03/04/18 1757    Margarita Mail, PA-C 03/04/18 1807    Teg[place], Gwenyth Allegra, MD 03/04/18 2027    Margarita Mail, PA-C 04/26/18 [place], Gwenyth Allegra, MD 04/26/18 1606

## 2018-03-04 NOTE — H&P (Signed)
History and Physical   Patient ID: Todd Romero, MRN: [id number], DOB: 1933/09/16   Date of Encounter: 03/04/2018, 10:05 PM  Primary Care Provider: [name], MD  Chief Complaint:  afib  History of Present Illness: Todd Romero is a 84 y.o. male w/ h/o AS s/p TAVR in July 4196, chronic diastolic HF (HFpEF), CAD w/ LHC 12-2016 as below, DM2, HTN, dyslipidemia, OSA who presented to ED from urgent care earlier today c/o leg swelling and SOB. He c/o feeling more SOB over the past 4-[redacted] weeks along w/ progressive worsening of leg swelling and orthopnea. He c/o associated 15-20 lb weight gain over the past several weeks. He has been on standing lasix, which has been steadily increased over the past couple weeks.  He denies palpitations; he has not noticed his heart racing or beating irregularly. He denies associated chest discomfort. He has been told before his heart was "irregular" but does not recall ever being told he had afib specifically. He was found to be in afib at the outside ED; his rate has been controlled, not on any rate-controlling meds. He has h/o GIB; he was actually hospitalized in march 2019 w/ duodenal and gastric ulcers along w/ esophagitis and so A/C has not been added.  Past Medical History:  Diagnosis Date  . Anemia   . Arthritis   . CAD (coronary artery disease)    a. cardiac cath 12/2016 showing moderate AS, elevated LVEDP, heavy 3V coronary calcification, 100% mD2, 30-50% LAD, 50-90% stenosis of Cx proximal to origin of L-PDA, RCA not engaged due to poor catheter control (difficult procedure) - coronary status was essentially unchanged from prior.  . Chronic diastolic CHF (congestive heart failure) ([place])   . Degenerative arthritis   . Diabetes ([place])   . Dyspnea   . Essential hypertension   . Hyperlipidemia   . Obesity (BMI 30-39.9) 06/18/2015  . OSA (obstructive sleep apnea)    Severe with AHI 27/hr now on CPAP  no cpap  . Peritonitis ([place]) 1985?  [name]  Pneumonia    "6 months - 84 years old"  . Pure hypercholesterolemia   . RBBB   . [facility] spotted fever   . 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 Severe aortic stenosis    a. s/p TAVR 03/2017.  . Type II or unspecified type diabetes mellitus without mention of complication, not stated as uncontrolled     Past Surgical History:  Procedure Laterality Date  . APPENDECTOMY    . CARPAL TUNNEL RELEASE    . COLECTOMY     partial  . COLONOSCOPY WITH PROPOFOL N/A 11/18/2017   Procedure: COLONOSCOPY WITH PROPOFOL;  Surgeon: Carol Ada, MD;  Location: [facility];  Service: Endoscopy;  Laterality: N/A;  . ESOPHAGOGASTRODUODENOSCOPY N/A 11/18/2017   Procedure: ESOPHAGOGASTRODUODENOSCOPY (EGD);  Surgeon: Carol Ada, MD;  Location: [facility];  Service: Endoscopy;  Laterality: N/A;  . EYE SURGERY Bilateral    cataracts removed, lens placed  . JOINT REPLACEMENT    . KNEE CARTILAGE SURGERY    . PARTIAL COLECTOMY    . RIGHT/LEFT HEART CATH AND CORONARY ANGIOGRAPHY N/A 12/28/2016   Procedure: Right/Left Heart Cath and Coronary Angiography;  Surgeon: Belva Crome, MD;  Location: [place] [facility];  Service: Cardiovascular;  Laterality: N/A;  . TEE WITHOUT CARDIOVERSION N/A 03/15/2017   Procedure: TRANSESOPHAGEAL ECHOCARDIOGRAM (TEE);  Surgeon: Lauree Chandler  D, MD;  Location: [place];  Service: Open Heart Surgery;  Laterality: N/A;  . TOTAL KNEE ARTHROPLASTY Right 11/08/2017  . TOTAL KNEE ARTHROPLASTY Right 11/08/2017   Procedure: TOTAL KNEE ARTHROPLASTY;  Surgeon: Melrose Nakayama, MD;  Location: [place];  Service: Orthopedics;  Laterality: Right;  . TRANSCATHETER AORTIC VALVE REPLACEMENT, TRANSFEMORAL N/A 03/15/2017   Procedure: TRANSCATHETER AORTIC VALVE REPLACEMENT, TRANSFEMORAL;  Surgeon: Burnell Blanks, MD;  Location: [place];  Service: Open Heart  Surgery;  Laterality: N/A;  . VEIN SURGERY       Prior to Admission medications   Medication Sig Start Date End Date Taking? Authorizing Provider  acetaminophen (TYLENOL) 500 MG tablet Take 500 mg by mouth every 4 (four) hours as needed for moderate pain or headache.   Yes [provider]  amLODipine (NORVASC) 2.5 MG tablet Take 2.5 mg by mouth daily.   Yes [provider]  aspirin EC 81 MG tablet Take 1 tablet (81 mg total) by mouth daily. 11/21/17  Yes Sheikh, Omair Latif, DO  clopidogrel (PLAVIX) 75 MG tablet Take 1 tablet (75 mg total) by mouth daily with breakfast. 11/21/17  Yes Sheikh, Omair Latif, DO  ferrous sulfate 325 (65 FE) MG tablet Take 325 mg by mouth daily with breakfast.   Yes [provider]  fish oil-omega-3 fatty acids 1000 MG capsule Take 1 g by mouth daily at 12 noon.    Yes [provider]  furosemide (LASIX) 20 MG tablet Take 1 tablet (20 mg total) by mouth daily. 11/19/17 03/04/18 Yes Sheikh, Omair Latif, DO  glimepiride (AMARYL) 2 MG tablet Take 0.5 tablets (1 mg total) by mouth daily. 05/06/17  Yes [name], MD  glucose blood (ACCU-CHEK AVIVA PLUS) test strip USE AS INSTRUCTED TO CHECK BLOOD SUGAR TWICE A DAY DX:E11.65 02/13/18  Yes [name], MD  insulin lispro (HUMALOG) 100 UNIT/ML injection Inject 4 to 10 units into the skin three (3) times daily with meals as directed.   Yes [provider]  insulin NPH Human (HUMULIN N,NOVOLIN N) 100 UNIT/ML injection Inject 0.26 mLs (26 Units total) into the skin at bedtime. Uses Vial 08/19/17  Yes [name], MD  Insulin Pen Needle (B-D UF III MINI PEN NEEDLES) 31G X 5 MM MISC USE 2 PEN NEEDLE PER DAY WITH HUMALOG AND VICTOZA 01/14/17  Yes [name], MD  metFORMIN (GLUMETZA) 500 MG (MOD) 24 hr tablet Take 1 tablet (500 mg total) by mouth 2 (two) times daily with a meal. (morning & noon) 05/06/17  Yes [name], MD  Multiple Vitamin (MULTIVITAMIN WITH MINERALS) TABS tablet Take 1  tablet by mouth daily.   Yes [provider]  Multiple Vitamins-Minerals (PRESERVISION AREDS 2 PO) Take 1 capsule by mouth 2 (two) times daily.   Yes [provider]  olmesartan (BENICAR) 40 MG tablet Take 1 tablet (40 mg total) by mouth daily. 12/27/17  Yes [name], MD  pantoprazole (PROTONIX) 40 MG tablet Take 1 tablet (40 mg total) by mouth 2 (two) times daily. 11/19/17  Yes Sheikh, Omair Latif, DO  pravastatin (PRAVACHOL) 80 MG tablet Take 1 tablet (80 mg total) by mouth every evening. 09/19/17  Yes [name], MD  tamsulosin [facility]) 0.4 MG CAPS capsule Take 1 capsule daily 09/19/17  Yes [name], MD  tiZANidine (ZANAFLEX) 4 MG tablet Take 1 tablet (4 mg total) by mouth every 6 (six) hours as needed. 11/11/17 11/11/18 Yes Loni Dolly, PA-C  ULTICARE INSULIN SYRINGE 31G X 5/16" 1  ML MISC USE TO INJECT INSULIN DAILY 01/04/18  Yes [name], MD  vitamin C (ASCORBIC ACID) 500 MG tablet Take 500 mg by mouth daily.   Yes [provider]  promethazine (PHENERGAN) 12.5 MG tablet Take 12.5 mg by mouth every 6 (six) hours as needed for nausea/vomiting. 11/14/17   [provider]  VICTOZA 18 MG/3ML SOPN INJECT 1.8 MG UNDER THE SKIN AT BEDTIME 03/04/18   [name], MD     Allergies: Allergies  Allergen Reactions  . Bactrim [Sulfamethoxazole-Trimethoprim] Nausea And Vomiting  . Morphine And Related Nausea And Vomiting    Social History:  The patient  reports that he has never smoked. He has never used smokeless tobacco. He reports that he does not drink alcohol or use drugs.   Family History:  The patient's family history includes Cancer in his sister; Emphysema in his father; Healthy in his sister; Heart failure in his father; Hypertension in his mother.   ROS:  Please see the history of present illness.     All other systems reviewed and negative.   Vital Signs: Blood pressure (!) 158/66, pulse 63, temperature 97.7 F (36.5 C), temperature source Oral, resp.  rate 16, height 5\' 8"  (1.727 m), weight 110.7 kg (244 lb), SpO2 100 %.  PHYSICAL EXAM: General:  Well nourished, well developed, in no acute distress HEENT: normal Lymph: no adenopathy Neck: + JVD to earlobe Endocrine:  No thryomegaly Vascular: No carotid bruits; DP pulses not palpable due to swelling Cardiac:  irreg irreg, S1, S2; RRR. No M/R/G Lungs:  clear to auscultation bilaterally, no wheezing, rhonchi or rales  Abd: soft, nontender, no hepatomegaly  Ext: warm and well-perfused; 1-2+ bilat LE edema. Ext are firm Musculoskeletal:  No deformities, BUE and BLE strength normal and equal Skin: warm and dry  Neuro:  CNs 2-12 intact, no focal abnormalities noted Psych:  Normal affect   EKG:  Afib, HR 63. Nonspecific IVCD  Labs:   Lab Results  Component Value Date   WBC 7.2 03/04/2018   HGB 9.8 (L) 03/04/2018   HCT 30.4 (L) 03/04/2018   MCV 88.9 03/04/2018   PLT 170 03/04/2018   Recent Labs  Lab 03/04/18 1324  NA 137  K 4.4  CL 106  CO2 24  BUN 33*  CREATININE 1.23  CALCIUM 8.7*  PROT 6.7  BILITOT 0.4  ALKPHOS 70  ALT 15  AST 21  GLUCOSE 157*   Recent Labs    03/04/18 1324  TROPONINI 0.03*   Troponin (Point of Care Test) No results for input(s): TROPIPOC in the last 72 hours.  Lab Results  Component Value Date   CHOL 127 02/10/2018   HDL 46.30 02/10/2018   LDLCALC 60 02/10/2018   TRIG 104.0 02/10/2018   No results found for: DDIMER  Radiology/Studies:  Dg Chest 2 View  Result Date: 03/04/2018 CLINICAL DATA:  Bilateral lower extremity swelling and shortness of breath. EXAM: CHEST - 2 VIEW COMPARISON:  11/16/2017 FINDINGS: Aortic valve replacement. Mildly enlarged cardiac silhouette. Calcific atherosclerotic disease and tortuosity of the aorta. Mediastinal contours appear intact. There is no evidence of focal airspace consolidation, pleural effusion or pneumothorax. Mild interstitial pulmonary edema. Small bilateral pleural effusions. Osseous structures  are without acute abnormality. Soft tissues are grossly normal. IMPRESSION: Enlarged cardiac silhouette. Mild interstitial pulmonary edema with small bilateral pleural effusions. Calcific atherosclerotic disease and tortuosity of the aorta. Electronically Signed   By: Fidela Salisbury M.D.   On: 03/04/2018 13:55  Echo 04-20-17 Left ventricle: Wall thickness was increased in a pattern of mild   LVH. Systolic function was normal. The estimated ejection   fraction was in the range of 55% to 60%. Wall motion was normal;   there were no regional wall motion abnormalities. Features are   consistent with a pseudonormal left ventricular filling pattern,   with concomitant abnormal relaxation and increased filling   pressure (grade 2 diastolic dysfunction). - Aortic valve: A bioprosthesis was present. Valve area (VTI): 1.9   cm^2. Valve area (Vmax): 1.86 cm^2. Valve area (Vmean): 1.71   cm^2. - Mitral valve: Mildly calcified annulus. - Left atrium: The atrium was moderately dilated. - Right atrium: The atrium was mildly dilated.  ASSESSMENT AND PLAN:   1. Afib: new-dx. Pt is currently in afib but rate-controlled. He is not on rate-controlling agents, but due to HR in low 60s I am not going to add anything at this time. Pt is already on plavix/ASA and has recent GIB. For this reason, he has not yet been started on additional [facility] for CVA prophylaxis. Risks/benefits can be weighed prior to d/c; it may be that he can cont plavix, add eliquis or xarelto, and d/c the ASA. Will defer this decision to primary cardiologist. Will not add any anti-arrhythmics at this point. He may do fine w/ rate control strategy once he is adequately diuresed. Will cont w/ IV lasix--40mg  bid.  Consider ambulatory monitoring at D/C to assess afib burden and rate control. TTE has been ordered and is pending.  2. CAD: stable. No acute anginal sx, trop 0.03.   3. HTN: restart home regimen and titrate PRN  4. Volume overload:  likely due to new-onset afib and loss of atrial kick. H/o preserved EF. Repeat TTE pending. Lasix 40mg  IV bid as above.  5. H/o TAVR: repeat TTE pending  Thank you for the opportunity to participate in the care of this patient. Will follow. Please call w/ questions.   Rudean Curt, MD , [facility] 03/04/18 10:53 PM

## 2018-03-04 NOTE — ED Notes (Signed)
Pt reports he went to urgent care dur to rash on left foot and lower leg and was advised to come to ED for eval

## 2018-03-04 NOTE — ED Notes (Signed)
Troponin 0.03, results given to ED PA

## 2018-03-04 NOTE — ED Notes (Signed)
URINAL at [facility]

## 2018-03-04 NOTE — Progress Notes (Signed)
Received pt from [facility] to 4E03. CHG bath completed, tele applied. VSS. No c/o pain or SOB. Cardiology paged and in to see pt. Updated pt and wife on plan of care, will continue to monitor.  Jaymes Graff, RN

## 2018-03-05 ENCOUNTER — Inpatient Hospital Stay (HOSPITAL_COMMUNITY): Payer: Medicare Other

## 2018-03-05 DIAGNOSIS — I4891 Unspecified atrial fibrillation: Secondary | ICD-10-CM

## 2018-03-05 DIAGNOSIS — I5033 Acute on chronic diastolic (congestive) heart failure: Secondary | ICD-10-CM

## 2018-03-05 LAB — CBC
HCT: 30.5 % — ABNORMAL LOW (ref 39.0–52.0)
Hemoglobin: 9.4 g/dL — ABNORMAL LOW (ref 13.0–17.0)
MCH: 28.1 pg (ref 26.0–34.0)
MCHC: 30.8 g/dL (ref 30.0–36.0)
MCV: 91 fL (ref 78.0–100.0)
PLATELETS: 164 10*3/uL (ref 150–400)
RBC: 3.35 MIL/uL — AB (ref 4.22–5.81)
RDW: 16.4 % — ABNORMAL HIGH (ref 11.5–15.5)
WBC: 6.2 10*3/uL (ref 4.0–10.5)

## 2018-03-05 LAB — GLUCOSE, CAPILLARY
Glucose-Capillary: 172 mg/dL — ABNORMAL HIGH (ref 70–99)
Glucose-Capillary: 156 mg/dL — ABNORMAL HIGH (ref 70–99)
Glucose-Capillary: 172 mg/dL — ABNORMAL HIGH (ref 70–99)
Glucose-Capillary: 192 mg/dL — ABNORMAL HIGH (ref 70–99)

## 2018-03-05 LAB — TROPONIN I
TROPONIN I: 0.03 ng/mL — AB (ref <0.03)
Troponin I: 0.04 ng/mL (ref <0.03)
Troponin I: 0.03 ng/mL (ref <0.03)

## 2018-03-05 LAB — TSH: TSH: 0.876 u[IU]/mL (ref 0.350–4.500)

## 2018-03-05 LAB — ECHOCARDIOGRAM COMPLETE
Height: 68 in
Weight: 3904 oz

## 2018-03-05 LAB — PROTIME-INR
INR: 1.1
PROTHROMBIN TIME: 14.1 s (ref 11.4–15.2)

## 2018-03-05 MED ORDER — APIXABAN 5 MG PO TABS
5.0000 mg | ORAL_TABLET | Freq: Two times a day (BID) | ORAL | Status: DC
  Administered 2018-03-05 – 2018-03-07 (×5): 5 mg via ORAL
  Filled 2018-03-05 (×5): qty 1

## 2018-03-05 NOTE — Progress Notes (Signed)
*   Echocardiogram 2D Echocardiogram has been performed.  Todd Romero T Xee Hollman 03/05/2018, 4:09 PM

## 2018-03-05 NOTE — Progress Notes (Signed)
Progress Note  Patient Name: Todd Romero Date of Encounter: 03/05/2018  Primary Cardiologist: Sinclair Grooms, MD   Subjective   SOB improving.   Inpatient Medications    Scheduled Meds: . amLODipine  2.5 mg Oral Daily  . aspirin EC  81 mg Oral Daily  . clopidogrel  75 mg Oral Q breakfast  . ferrous sulfate  325 mg Oral Q breakfast  . furosemide  40 mg Intravenous BID  . glimepiride  1 mg Oral Q breakfast  . insulin aspart  0-20 Units Subcutaneous TID WC  . insulin aspart  0-5 Units Subcutaneous QHS  . insulin NPH Human  26 Units Subcutaneous QHS  . irbesartan  300 mg Oral Daily  . metFORMIN  500 mg Oral BID WC  . multivitamin with minerals  1 tablet Oral Daily  . omega-3 acid ethyl esters  1 g Oral Q1200  . pantoprazole  40 mg Oral BID  . pravastatin  80 mg Oral QPM  . tamsulosin  0.4 mg Oral Daily  . vitamin C  500 mg Oral Daily   Continuous Infusions:  PRN Meds: acetaminophen, ondansetron (ZOFRAN) IV   Vital Signs    Vitals:   03/04/18 2000 03/04/18 2107 03/05/18 0443 03/05/18 0905  BP: 137/72 (!) 158/66 (!) 158/71 130/72  Pulse: 63  69 72  Resp: 16  (!) 22   Temp:  97.7 F (36.5 C) 98.5 F (36.9 C)   TempSrc:  Oral Oral   SpO2: 97% 100% 96%   Weight:  244 lb (110.7 kg)    Height:  5\' 8"  (1.727 m)      Intake/Output Summary (Last 24 hours) at 03/05/2018 1129 Last data filed at 03/05/2018 1029 Gross per 24 hour  Intake 480 ml  Output 3490 ml  Net -3010 ml   Filed Weights   03/04/18 1308 03/04/18 2107  Weight: 251 lb (113.9 kg) 244 lb (110.7 kg)    Telemetry    Rate controlled afib - Personally Reviewed  ECG    na  Physical Exam   GEN: No acute distress.   Neck: Elevated JVD Cardiac: irreg, no murmurs, rubs, or gallops.  Respiratory: Clear to auscultation bilaterally. GI: Soft, nontender, non-distended  MS: 2-3+ bialteral LE edema; No deformity. Neuro:  Nonfocal  Psych: Normal affect   Labs    Chemistry Recent Labs    Lab 03/04/18 1324  NA 137  K 4.4  CL 106  CO2 24  GLUCOSE 157*  BUN 33*  CREATININE 1.23  CALCIUM 8.7*  PROT 6.7  ALBUMIN 3.7  AST 21  ALT 15  ALKPHOS 70  BILITOT 0.4  GFRNONAA 53*  GFRAA >60  ANIONGAP 7     Hematology Recent Labs  Lab 03/04/18 1324 03/05/18 0618  WBC 7.2 6.2  RBC 3.42* 3.35*  HGB 9.8* 9.4*  HCT 30.4* 30.5*  MCV 88.9 91.0  MCH 28.7 28.1  MCHC 32.2 30.8  RDW 16.3* 16.4*  PLT 170 164    Cardiac Enzymes Recent Labs  Lab 03/04/18 1324 03/04/18 2350 03/05/18 0618  TROPONINI 0.03* 0.04* 0.03*   No results for input(s): TROPIPOC in the last 168 hours.   BNP Recent Labs  Lab 03/04/18 1324  BNP 261.9*     DDimer No results for input(s): DDIMER in the last 168 hours.   Radiology    Dg Chest 2 View  Result Date: 03/04/2018 CLINICAL DATA:  Bilateral lower extremity swelling and shortness of breath. EXAM: CHEST -  2 VIEW COMPARISON:  11/16/2017 FINDINGS: Aortic valve replacement. Mildly enlarged cardiac silhouette. Calcific atherosclerotic disease and tortuosity of the aorta. Mediastinal contours appear intact. There is no evidence of focal airspace consolidation, pleural effusion or pneumothorax. Mild interstitial pulmonary edema. Small bilateral pleural effusions. Osseous structures are without acute abnormality. Soft tissues are grossly normal. IMPRESSION: Enlarged cardiac silhouette. Mild interstitial pulmonary edema with small bilateral pleural effusions. Calcific atherosclerotic disease and tortuosity of the aorta. Electronically Signed   By: Fidela Salisbury M.D.   On: 03/04/2018 13:55    Cardiac Studies     Patient Profile     Todd Romero is a 84 y.o. male w/ h/o AS s/p TAVR in July 7622, chronic diastolic HF (HFpEF), CAD w/ LHC 12-2016 as below, DM2, HTN, dyslipidemia, OSA who presented to ED from urgent care earlier today c/o leg swelling and SOB. He c/o feeling more SOB over the past 4-[redacted] weeks along w/ progressive worsening  of leg swelling and orthopnea. He c/o associated 15-20 lb weight gain over the past several weeks. He has been on standing lasix, which has been steadily increased over the past couple weeks.    Assessment & Plan    1. Acute on chronic diastolic HF - presented to OSH with leg swelling and SOB, reported 15-20 lbs weight gain. Has not responsed to increasing lasix at home.  - negative 2.8 L yesterday, he received lasix 60mg  IV x 1 yesterday, now on IV 40mg  bid. Follow renal function  - echo 04/2017 LVEF 55-60%, grade II diastolic dysfunction -repeat echo pending - continue IV diuresis, remains severely volume overloaded.   2. History of TAVR 03/2017 - normal function by 04/2017 echo Repeat echo pending - stop ASA and plavix patient has been on post TAVR, patient nearly 1 year out. . From literature review when using using anticoagulation often stop plavix at 3 months. Now with new indication for anticoag given new diagnosis of afib and nearly 1 year out from procedure, will stop DAPT and start anticoag inset.    3. History of GI bleed - admit 11/2017 with duodenal and gastric ulcers, esophagitis.  - no recent issues   4. CAD - no acute issues   5. Afib - new diagnosis this admission, rates have been controlled without av nodal agents - CHADS2Vasc score is (age x 2, HTN, CHF, DM, CAD) is 6. We will start eliquis 5mg  bid, stop asa/plavix - high risk for CVA. No recurrent GI issues since bleed in 11/2017. Has tolerated DAPT since then.  Will need close monitoring on anticoag.      For questions or updates, please contact [place] Please consult [email or web address] for contact info under Cardiology/STEMI.      Merrily Pew, MD  03/05/2018, 11:29 AM

## 2018-03-06 ENCOUNTER — Telehealth: Payer: Self-pay | Admitting: Interventional Cardiology

## 2018-03-06 DIAGNOSIS — E785 Hyperlipidemia, unspecified: Secondary | ICD-10-CM | POA: Diagnosis not present

## 2018-03-06 DIAGNOSIS — I509 Heart failure, unspecified: Secondary | ICD-10-CM

## 2018-03-06 DIAGNOSIS — Z8249 Family history of ischemic heart disease and other diseases of the circulatory system: Secondary | ICD-10-CM | POA: Diagnosis not present

## 2018-03-06 DIAGNOSIS — Z8711 Personal history of peptic ulcer disease: Secondary | ICD-10-CM | POA: Diagnosis not present

## 2018-03-06 DIAGNOSIS — I4891 Unspecified atrial fibrillation: Secondary | ICD-10-CM | POA: Diagnosis present

## 2018-03-06 DIAGNOSIS — Z882 Allergy status to sulfonamides status: Secondary | ICD-10-CM | POA: Diagnosis not present

## 2018-03-06 DIAGNOSIS — Z7902 Long term (current) use of antithrombotics/antiplatelets: Secondary | ICD-10-CM | POA: Diagnosis not present

## 2018-03-06 DIAGNOSIS — Z7901 Long term (current) use of anticoagulants: Secondary | ICD-10-CM | POA: Diagnosis not present

## 2018-03-06 DIAGNOSIS — Z8679 Personal history of other diseases of the circulatory system: Secondary | ICD-10-CM | POA: Diagnosis not present

## 2018-03-06 DIAGNOSIS — M1990 Unspecified osteoarthritis, unspecified site: Secondary | ICD-10-CM | POA: Diagnosis not present

## 2018-03-06 DIAGNOSIS — Q211 Atrial septal defect: Secondary | ICD-10-CM | POA: Diagnosis not present

## 2018-03-06 DIAGNOSIS — N179 Acute kidney failure, unspecified: Secondary | ICD-10-CM | POA: Diagnosis not present

## 2018-03-06 DIAGNOSIS — I5043 Acute on chronic combined systolic (congestive) and diastolic (congestive) heart failure: Secondary | ICD-10-CM | POA: Diagnosis not present

## 2018-03-06 DIAGNOSIS — Z79899 Other long term (current) drug therapy: Secondary | ICD-10-CM | POA: Diagnosis not present

## 2018-03-06 DIAGNOSIS — I071 Rheumatic tricuspid insufficiency: Secondary | ICD-10-CM | POA: Diagnosis not present

## 2018-03-06 DIAGNOSIS — G4733 Obstructive sleep apnea (adult) (pediatric): Secondary | ICD-10-CM | POA: Diagnosis not present

## 2018-03-06 DIAGNOSIS — I2510 Atherosclerotic heart disease of native coronary artery without angina pectoris: Secondary | ICD-10-CM | POA: Diagnosis not present

## 2018-03-06 DIAGNOSIS — Z7982 Long term (current) use of aspirin: Secondary | ICD-10-CM | POA: Diagnosis not present

## 2018-03-06 DIAGNOSIS — I110 Hypertensive heart disease with heart failure: Secondary | ICD-10-CM | POA: Diagnosis not present

## 2018-03-06 DIAGNOSIS — E119 Type 2 diabetes mellitus without complications: Secondary | ICD-10-CM | POA: Diagnosis not present

## 2018-03-06 DIAGNOSIS — Z953 Presence of xenogenic heart valve: Secondary | ICD-10-CM | POA: Diagnosis not present

## 2018-03-06 DIAGNOSIS — Z794 Long term (current) use of insulin: Secondary | ICD-10-CM | POA: Diagnosis not present

## 2018-03-06 DIAGNOSIS — Z885 Allergy status to narcotic agent status: Secondary | ICD-10-CM | POA: Diagnosis not present

## 2018-03-06 DIAGNOSIS — I4510 Unspecified right bundle-branch block: Secondary | ICD-10-CM | POA: Diagnosis not present

## 2018-03-06 LAB — BASIC METABOLIC PANEL
ANION GAP: 9 (ref 5–15)
BUN: 26 mg/dL — AB (ref 8–23)
CO2: 26 mmol/L (ref 22–32)
Calcium: 8.8 mg/dL — ABNORMAL LOW (ref 8.9–10.3)
Chloride: 105 mmol/L (ref 98–111)
Creatinine, Ser: 1.32 mg/dL — ABNORMAL HIGH (ref 0.61–1.24)
GFR calc non Af Amer: 49 mL/min — ABNORMAL LOW (ref >60)
GFR, EST AFRICAN AMERICAN: 57 mL/min — AB (ref >60)
Glucose, Bld: 154 mg/dL — ABNORMAL HIGH (ref 70–99)
POTASSIUM: 3.9 mmol/L (ref 3.5–5.1)
SODIUM: 140 mmol/L (ref 135–145)

## 2018-03-06 LAB — CBC
HEMATOCRIT: 30.7 % — AB (ref 39.0–52.0)
HEMOGLOBIN: 9.6 g/dL — AB (ref 13.0–17.0)
MCH: 28.3 pg (ref 26.0–34.0)
MCHC: 31.3 g/dL (ref 30.0–36.0)
MCV: 90.6 fL (ref 78.0–100.0)
Platelets: 171 10*3/uL (ref 150–400)
RBC: 3.39 MIL/uL — AB (ref 4.22–5.81)
RDW: 16.3 % — AB (ref 11.5–15.5)
WBC: 7 10*3/uL (ref 4.0–10.5)

## 2018-03-06 LAB — MAGNESIUM: Magnesium: 2 mg/dL (ref 1.7–2.4)

## 2018-03-06 LAB — GLUCOSE, CAPILLARY
GLUCOSE-CAPILLARY: 147 mg/dL — AB (ref 70–99)
Glucose-Capillary: 170 mg/dL — ABNORMAL HIGH (ref 70–99)
Glucose-Capillary: 118 mg/dL — ABNORMAL HIGH (ref 70–99)
Glucose-Capillary: 183 mg/dL — ABNORMAL HIGH (ref 70–99)

## 2018-03-06 MED ORDER — FUROSEMIDE 10 MG/ML IJ SOLN
80.0000 mg | Freq: Two times a day (BID) | INTRAMUSCULAR | Status: DC
  Administered 2018-03-06 – 2018-03-07 (×2): 80 mg via INTRAVENOUS
  Filled 2018-03-06 (×2): qty 8

## 2018-03-06 NOTE — Telephone Encounter (Signed)
Will route to Dr. Smith for review.  

## 2018-03-06 NOTE — Progress Notes (Signed)
Progress Note  Patient Name: Todd Romero Date of Encounter: 03/06/2018  Primary Cardiologist: Sinclair Grooms, MD  Subjective   Feeling much better today.   Inpatient Medications    Scheduled Meds: . amLODipine  2.5 mg Oral Daily  . apixaban  5 mg Oral BID  . ferrous sulfate  325 mg Oral Q breakfast  . furosemide  40 mg Intravenous BID  . glimepiride  1 mg Oral Q breakfast  . insulin aspart  0-20 Units Subcutaneous TID WC  . insulin aspart  0-5 Units Subcutaneous QHS  . insulin NPH Human  26 Units Subcutaneous QHS  . irbesartan  300 mg Oral Daily  . metFORMIN  500 mg Oral BID WC  . multivitamin with minerals  1 tablet Oral Daily  . omega-3 acid ethyl esters  1 g Oral Q1200  . pantoprazole  40 mg Oral BID  . pravastatin  80 mg Oral QPM  . tamsulosin  0.4 mg Oral Daily  . vitamin C  500 mg Oral Daily   Continuous Infusions:  PRN Meds: acetaminophen, ondansetron (ZOFRAN) IV   Vital Signs    Vitals:   03/05/18 0905 03/05/18 1422 03/05/18 2057 03/06/18 0448  BP: 130/72 138/65 136/73 140/72  Pulse: 72 69    Resp:  18  13  Temp:  98.5 F (36.9 C) 98.4 F (36.9 C) 97.7 F (36.5 C)  TempSrc:  Oral Oral Oral  SpO2:  98% 97% 95%  Weight:    238 lb 1.6 oz (108 kg)  Height:        Intake/Output Summary (Last 24 hours) at 03/06/2018 1215 Last data filed at 03/06/2018 1200 Gross per 24 hour  Intake 720 ml  Output 2475 ml  Net -1755 ml   Filed Weights   03/04/18 1308 03/04/18 2107 03/06/18 0448  Weight: 251 lb (113.9 kg) 244 lb (110.7 kg) 238 lb 1.6 oz (108 kg)    Telemetry    Afib rate controlled - Personally Reviewed  Physical Exam   General: Well developed, well nourished, male appearing in no acute distress. Head: Normocephalic, atraumatic.  Neck: Supple, elevated JVD. Lungs:  Resp regular and unlabored, crackles in bases. Heart: Irreg Irreg, S1, S2, soft systolic murmur; no rub. Abdomen: Soft, non-tender, non-distended with normoactive bowel  sounds.  Extremities: No clubbing, cyanosis, 2+ LE edema. Distal pedal pulses are 2+ bilaterally. Neuro: Alert and oriented X 3. Moves all extremities spontaneously. Psych: Normal affect.  Labs    Chemistry Recent Labs  Lab 03/04/18 1324 03/06/18 0408  NA 137 140  K 4.4 3.9  CL 106 105  CO2 24 26  GLUCOSE 157* 154*  BUN 33* 26*  CREATININE 1.23 1.32*  CALCIUM 8.7* 8.8*  PROT 6.7  --   ALBUMIN 3.7  --   AST 21  --   ALT 15  --   ALKPHOS 70  --   BILITOT 0.4  --   GFRNONAA 53* 49*  GFRAA >60 57*  ANIONGAP 7 9     Hematology Recent Labs  Lab 03/04/18 1324 03/05/18 0618 03/06/18 0408  WBC 7.2 6.2 7.0  RBC 3.42* 3.35* 3.39*  HGB 9.8* 9.4* 9.6*  HCT 30.4* 30.5* 30.7*  MCV 88.9 91.0 90.6  MCH 28.7 28.1 28.3  MCHC 32.2 30.8 31.3  RDW 16.3* 16.4* 16.3*  PLT 170 164 171    Cardiac Enzymes Recent Labs  Lab 03/04/18 1324 03/04/18 2350 03/05/18 0618 03/05/18 1125  TROPONINI 0.03* 0.04* 0.03*  0.03*   No results for input(s): TROPIPOC in the last 168 hours.   BNP Recent Labs  Lab 03/04/18 1324  BNP 261.9*     DDimer No results for input(s): DDIMER in the last 168 hours.    Radiology    Dg Chest 2 View  Result Date: 03/04/2018 CLINICAL DATA:  Bilateral lower extremity swelling and shortness of breath. EXAM: CHEST - 2 VIEW COMPARISON:  11/16/2017 FINDINGS: Aortic valve replacement. Mildly enlarged cardiac silhouette. Calcific atherosclerotic disease and tortuosity of the aorta. Mediastinal contours appear intact. There is no evidence of focal airspace consolidation, pleural effusion or pneumothorax. Mild interstitial pulmonary edema. Small bilateral pleural effusions. Osseous structures are without acute abnormality. Soft tissues are grossly normal. IMPRESSION: Enlarged cardiac silhouette. Mild interstitial pulmonary edema with small bilateral pleural effusions. Calcific atherosclerotic disease and tortuosity of the aorta. Electronically Signed   By: Fidela Salisbury M.D.   On: 03/04/2018 13:55    Cardiac Studies   TTE: 03/05/18  Study Conclusions  - Left ventricle: The cavity size was normal. Wall thickness was   increased in a pattern of moderate LVH. The estimated ejection   fraction was 43%. Diffuse hypokinesis. The study is not   technically sufficient to allow evaluation of LV diastolic   function. Ejection fraction (MOD, 2-plane): 43%. - Aortic valve: s/p TAVR valve. No obstruction. Trivial   paravalvular leak. Mean gradient (S): 10 mm Hg. Peak gradient   (S): 24 mm Hg. Valve area (VTI): 1.18 cm^2. Valve area (Vmax):   1.09 cm^2. Valve area (Vmean): 1.2 cm^2. - Left atrium: The atrium was mildly dilated. - Right ventricle: The cavity size was mildly dilated. - Atrial septum: Small atrial septal defect. - Tricuspid valve: There was moderate regurgitation. - Pulmonary arteries: PA peak pressure: 47 mm Hg (S). - Inferior vena cava: The vessel was dilated. The respirophasic   diameter changes were blunted (< 50%), consistent with elevated   central venous pressure.  Impressions:  - Compared to a prior study in 04/2017, the LVEF is lower at 43%   with global hypokinesis. The TAVR valve is non-obstructed with   trivial perivalvular leak and has a mean gradient of 10 mmHg. A   small atrial septal defect is noted. There is moderate TR with an   RVSP of 47 mmHg and a dilated IVC.  Patient Profile     84 y.o. male w/ h/o AS s/p TAVR in July 0272, chronic diastolic HF (HFpEF), CAD w/ LHC 12-2016 as below, DM2, HTN, dyslipidemia, OSA who presented to ED from urgent care earlier today c/o leg swelling and SOB. He c/o feeling more SOB over the past 4-[redacted] weeks along w/ progressive worsening of leg swelling and orthopnea. He c/o associated15-20 lb weight gain over the past severalweeks. He has been on standing lasix, which has been steadily increased over the past couple weeks.  Assessment & Plan    1. Acute on chronic diastolic HF:  presented to OSH with leg swelling and SOB, reported 15-20 lbs weight gain. Has not responsed to increasing lasix at home.  - negative 2.9L yesterday, with weight trending down.  - echo 04/2017 LVEF 55-60%, grade II diastolic dysfunction, repeat this admission shows decline in  EF to 43% with global hypokinesis.  - remains overloaded, will continue with IV lasix.  2. History of TAVR 03/2017:  - normal function by 04/2017 echo, and stable on echo this admission. - stop ASA and plavix has been stopped given the need  for [facility] with his new onset Afib.   3. History of GI bleed: - admit 11/2017 with duodenal and gastric ulcers, esophagitis.  - no recent issues, CBC stable.  4. CAD: - no acute issues  5. New onset Afib: new diagnosis this admission, rates have been controlled without av nodal agents - CHADS2Vasc score is (age x 2, HTN, CHF, DM, CAD) is 6. He was started on Eliquis 5mg  BID. H/H stable this morning.   Signed, Reino Bellis, NP  03/06/2018, 12:15 PM  Pager # [phone]   For questions or updates, please contact [place] Please consult [email or web address] for contact info under Cardiology/STEMI.

## 2018-03-06 NOTE — Telephone Encounter (Signed)
New Message:      Pt's wife is calling and states that the pt is currently in the hospital and was [facility]. She states that they have taken him off of his plavix and replaced it with Eliquis. She states the pt had severe swelling and gained about 15 lbs in a matter of days. Pt's wife just wants to make sure Dr. Is able to see everything they have done and make sure he agrees with the changes that are being made.

## 2018-03-07 ENCOUNTER — Other Ambulatory Visit: Payer: Self-pay | Admitting: Cardiology

## 2018-03-07 DIAGNOSIS — I5043 Acute on chronic combined systolic (congestive) and diastolic (congestive) heart failure: Secondary | ICD-10-CM | POA: Diagnosis not present

## 2018-03-07 DIAGNOSIS — Z7901 Long term (current) use of anticoagulants: Secondary | ICD-10-CM

## 2018-03-07 DIAGNOSIS — I4891 Unspecified atrial fibrillation: Secondary | ICD-10-CM | POA: Diagnosis not present

## 2018-03-07 DIAGNOSIS — Z79899 Other long term (current) drug therapy: Secondary | ICD-10-CM

## 2018-03-07 LAB — CBC
HEMATOCRIT: 31.5 % — AB (ref 39.0–52.0)
Hemoglobin: 9.9 g/dL — ABNORMAL LOW (ref 13.0–17.0)
MCH: 28.4 pg (ref 26.0–34.0)
MCHC: 31.4 g/dL (ref 30.0–36.0)
MCV: 90.3 fL (ref 78.0–100.0)
PLATELETS: 176 10*3/uL (ref 150–400)
RBC: 3.49 MIL/uL — ABNORMAL LOW (ref 4.22–5.81)
RDW: 16.3 % — AB (ref 11.5–15.5)
WBC: 8.5 10*3/uL (ref 4.0–10.5)

## 2018-03-07 LAB — GLUCOSE, CAPILLARY
Glucose-Capillary: 96 mg/dL (ref 70–99)
Glucose-Capillary: 145 mg/dL — ABNORMAL HIGH (ref 70–99)

## 2018-03-07 LAB — BASIC METABOLIC PANEL
ANION GAP: 9 (ref 5–15)
BUN: 26 mg/dL — AB (ref 8–23)
CO2: 27 mmol/L (ref 22–32)
CREATININE: 1.34 mg/dL — AB (ref 0.61–1.24)
Calcium: 8.8 mg/dL — ABNORMAL LOW (ref 8.9–10.3)
Chloride: 102 mmol/L (ref 98–111)
GFR calc non Af Amer: 48 mL/min — ABNORMAL LOW (ref >60)
GFR, EST AFRICAN AMERICAN: 56 mL/min — AB (ref >60)
GLUCOSE: 118 mg/dL — AB (ref 70–99)
Potassium: 3.8 mmol/L (ref 3.5–5.1)
Sodium: 138 mmol/L (ref 135–145)

## 2018-03-07 MED ORDER — APIXABAN 5 MG PO TABS: 5.0000 mg | ORAL_TABLET | Freq: Two times a day (BID) | ORAL | 2 refills | Status: DC

## 2018-03-07 MED ORDER — FUROSEMIDE 80 MG PO TABS: 80.0000 mg | ORAL_TABLET | Freq: Every day | ORAL | Status: DC

## 2018-03-07 MED ORDER — FUROSEMIDE 80 MG PO TABS: 80.0000 mg | ORAL_TABLET | Freq: Every day | ORAL | 2 refills | Status: DC

## 2018-03-07 NOTE — Telephone Encounter (Signed)
I am taking care of him in the hospital.

## 2018-03-07 NOTE — Plan of Care (Signed)
  Problem: Education: Goal: Knowledge of General Education information will improve Outcome: Completed/Met   Problem: Health Behavior/Discharge Planning: Goal: Ability to manage health-related needs will improve Outcome: Completed/Met Problem: Activity: Goal: Risk for activity intolerance will decrease Outcome: Completed/Met   Problem: Elimination: Goal: Will not experience complications related to bowel motility Outcome: Completed/Met Goal: Will not experience complications related to urinary retention Outcome: Completed/Met   Problem: Pain Managment: Goal: General experience of comfort will improve Outcome: Completed/Met   Problem: Skin Integrity: Goal: Risk for impaired skin integrity will decrease Outcome: Completed/Met   Problem: Education: Goal: Ability to demonstrate management of disease process will improve Outcome: Completed/Met Goal: Ability to verbalize understanding of medication therapies will improve Outcome: Completed/Met   Problem: Activity: Goal: Capacity to carry out activities will improve Outcome: Completed/Met   Problem: Cardiac: Goal: Ability to achieve and maintain adequate cardiopulmonary perfusion will improve Outcome: Completed/Met     Problem: Clinical Measurements: Goal: Ability to maintain clinical measurements within normal limits will improve Outcome: Completed/Met Goal: Will remain free from infection Outcome: Completed/Met Goal: Diagnostic test results will improve Outcome: Completed/Met Goal: Respiratory complications will improve Outcome: Completed/Met Goal: Cardiovascular complication will be avoided Outcome: Completed/Met

## 2018-03-07 NOTE — Discharge Summary (Signed)
Discharge Summary    Patient ID: Todd Romero,  MRN: [id number], DOB/AGE: Jun 25, 1934 84 y.o.  Admit date: 03/04/2018 Discharge date: 03/07/2018  Primary Care Provider: [name] Primary Cardiologist: Dr. Tamala Julian  Discharge Diagnoses    Active Problems:   New onset a-fib ([place])   A-fib ([place])   Acute on chronic congestive heart failure ([place])   Allergies Allergies  Allergen Reactions  . Bactrim [Sulfamethoxazole-Trimethoprim] Nausea And Vomiting and Other (See Comments)    Bleeding and ulcers  . Morphine And Related Nausea And Vomiting    Diagnostic Studies/Procedures    TTE: 03/05/18  Study Conclusions  - Left ventricle: The cavity size was normal. Wall thickness was   increased in a pattern of moderate LVH. The estimated ejection   fraction was 43%. Diffuse hypokinesis. The study is not   technically sufficient to allow evaluation of LV diastolic   function. Ejection fraction (MOD, 2-plane): 43%. - Aortic valve: s/p TAVR valve. No obstruction. Trivial   paravalvular leak. Mean gradient (S): 10 mm Hg. Peak gradient   (S): 24 mm Hg. Valve area (VTI): 1.18 cm^2. Valve area (Vmax):   1.09 cm^2. Valve area (Vmean): 1.2 cm^2. - Left atrium: The atrium was mildly dilated. - Right ventricle: The cavity size was mildly dilated. - Atrial septum: Small atrial septal defect. - Tricuspid valve: There was moderate regurgitation. - Pulmonary arteries: PA peak pressure: 47 mm Hg (S). - Inferior vena cava: The vessel was dilated. The respirophasic   diameter changes were blunted (< 50%), consistent with elevated   central venous pressure.  Impressions:  - Compared to a prior study in 04/2017, the LVEF is lower at 43%   with global hypokinesis. The TAVR valve is non-obstructed with   trivial perivalvular leak and has a mean gradient of 10 mmHg. A   small atrial septal defect is noted. There is moderate TR with an   RVSP of 47 mmHg and a dilated IVC. _____________     History of Present Illness      84 y.o. male w/ h/o AS s/p TAVR in July 4010, chronic diastolic HF (HFpEF), CAD w/ LHC 12-2016 as below, DM2, HTN, dyslipidemia, OSA who presented to ED from urgent care c/o leg swelling and SOB. He c/o feeling more SOB over the past 4-5 weeks prior to admission along w/ progressive worsening of leg swelling and orthopnea. He c/o associated 15-20 lb weight gain over the past several weeks. He had been on standing lasix, which has been steadily increased over the past couple weeks.  He denied palpitations; he had not noticed his heart racing or beating irregularly. He denied associated chest discomfort. He had been told before his heart was "irregular" but does not recall ever being told he had afib specifically. He was found to be in afib at the outside ED; his rate was controlled, not on any rate-controlling meds. He has h/o GIB; he was actually hospitalized in march 2019 w/ duodenal and gastric ulcers along w/ esophagitis and so A/C has not been added. He was admitted with further work up.   Hospital Course     Placed on IV lasix 40mg  BID with good UOP response and weight declining. Given he was a year out from his TAVR, decision was made to stop his ASA/plavix and start on Eliquis 5mg  BID as his ChadsVasc score was 6. He remained in Afib with ventricular rate controlled throughout admission without the addition of AV nodal blocking agents. Lasix  was further increased to 80mg  IV BID. Net - 8.5 and weight dropped from 251>>234lbs. His home lasix dose was adjusted to 80mg  daily. Echo this admission did show a mild decline in EF to 43% with global hypokinesis. He was continued on amlodipine 2.5mg  daily, Benicar 40mg  and lasix adjusted as noted above. He was given instructions on daily weights and diet prior to discharge. Will need cardioversion after 3-4 weeks of [place] per Dr. Tamala Julian.   General: Well developed, well nourished, male appearing in no acute distress. Head:  Normocephalic, atraumatic.  Neck: Supple, mild JVD. Lungs:  Resp regular and unlabored, CTA. Heart: Irreg Irreg, S1, S2, no S3, S4, or murmur; no rub. Abdomen: Soft, non-tender, non-distended with normoactive bowel sounds.  Extremities: No clubbing, cyanosis, 2+ LE edema. Distal pedal pulses are 2+ bilaterally. Neuro: Alert and oriented X 3. Moves all extremities spontaneously. Psych: Normal affect.  Todd Romero was seen by Dr. Tamala Julian and determined stable for discharge home. Follow up in the office has been arranged. Medications are listed below.   _____________  Discharge Vitals Blood pressure 109/74, pulse 70, temperature 98 F (36.7 C), temperature source Oral, resp. rate (!) 22, height 5\' 8"  (1.727 m), weight 234 lb 4.8 oz (106.3 kg), SpO2 98 %.  Filed Weights   03/04/18 2107 03/06/18 0448 03/07/18 0238  Weight: 244 lb (110.7 kg) 238 lb 1.6 oz (108 kg) 234 lb 4.8 oz (106.3 kg)    Labs & Radiologic Studies    CBC Recent Labs    03/06/18 0408 03/07/18 0231  WBC 7.0 8.5  HGB 9.6* 9.9*  HCT 30.7* 31.5*  MCV 90.6 90.3  PLT 171 563   Basic Metabolic Panel Recent Labs    03/06/18 0408 03/07/18 0231  NA 140 138  K 3.9 3.8  CL 105 102  CO2 26 27  GLUCOSE 154* 118*  BUN 26* 26*  CREATININE 1.32* 1.34*  CALCIUM 8.8* 8.8*  MG 2.0  --    Liver Function Tests No results for input(s): AST, ALT, ALKPHOS, BILITOT, PROT, ALBUMIN in the last 72 hours. No results for input(s): LIPASE, AMYLASE in the last 72 hours. Cardiac Enzymes Recent Labs    03/04/18 2350 03/05/18 0618 03/05/18 1125  TROPONINI 0.04* 0.03* 0.03*   BNP Invalid input(s): POCBNP D-Dimer No results for input(s): DDIMER in the last 72 hours. Hemoglobin A1C No results for input(s): HGBA1C in the last 72 hours. Fasting Lipid Panel No results for input(s): CHOL, HDL, LDLCALC, TRIG, CHOLHDL, LDLDIRECT in the last 72 hours. Thyroid Function Tests Recent Labs    03/04/18 2350  TSH 0.876    _____________  Dg Chest 2 View  Result Date: 03/04/2018 CLINICAL DATA:  Bilateral lower extremity swelling and shortness of breath. EXAM: CHEST - 2 VIEW COMPARISON:  11/16/2017 FINDINGS: Aortic valve replacement. Mildly enlarged cardiac silhouette. Calcific atherosclerotic disease and tortuosity of the aorta. Mediastinal contours appear intact. There is no evidence of focal airspace consolidation, pleural effusion or pneumothorax. Mild interstitial pulmonary edema. Small bilateral pleural effusions. Osseous structures are without acute abnormality. Soft tissues are grossly normal. IMPRESSION: Enlarged cardiac silhouette. Mild interstitial pulmonary edema with small bilateral pleural effusions. Calcific atherosclerotic disease and tortuosity of the aorta. Electronically Signed   By: Fidela Salisbury M.D.   On: 03/04/2018 13:55   Disposition   Pt is being discharged home today in good condition.  Follow-up Plans & Appointments    Follow-up Information    Burtis Junes, NP Follow up on  03/28/2018.   Specialties:  Nurse Practitioner, Interventional Cardiology, Cardiology, Radiology Why:  at 11am for your follow up appt.  Contact information: [place]. [address] [place] [address] [phone]        Eileen Stanford, PA-C Follow up on 03/15/2018.   Specialties:  Cardiology, Radiology Why:  at 3pm for your follow up appt.  Contact information: [place] [id number] [phone]        [place] [facility] Follow up on 03/10/2018.   Specialty:  Cardiology Why:  please come in for labs between 8-5pm. Contact information: [address] [place] [phone]         Discharge Instructions    Diet - low sodium heart healthy   Complete by:  As directed    Discharge instructions   Complete by:  As directed    For patients with congestive heart failure, we give them these  special instructions:  1. Follow a low-salt diet and watch your fluid intake. In general, you should not be taking in more than 2 liters of fluid per day (no more than 8 glasses per day). Some patients are restricted to less than 1.5 liters of fluid per day (no more than 6 glasses per day). This includes sources of water in foods like soup, coffee, tea, milk, etc. 2. Weigh yourself on the same scale at same time of day and keep a log. 3. Call your doctor: (Anytime you feel any of the following symptoms)  - 3-4 pound weight gain in 1-2 days or 2 pounds overnight  - Shortness of breath, with or without a dry hacking cough  - Swelling in the hands, feet or stomach  - If you have to sleep on extra pillows at night in order to breathe   IT IS IMPORTANT TO LET YOUR DOCTOR KNOW EARLY ON IF YOU ARE HAVING SYMPTOMS SO WE CAN HELP YOU!   Increase activity slowly   Complete by:  As directed       Discharge Medications     Medication List    STOP taking these medications   aspirin EC 81 MG tablet   clopidogrel 75 MG tablet Commonly known as:  PLAVIX   tiZANidine 4 MG tablet Commonly known as:  ZANAFLEX     TAKE these medications   acetaminophen 500 MG tablet Commonly known as:  TYLENOL Take 500 mg by mouth every 4 (four) hours as needed for moderate pain or headache.   amLODipine 2.5 MG tablet Commonly known as:  NORVASC Take 2.5 mg by mouth daily.   apixaban 5 MG Tabs tablet Commonly known as:  ELIQUIS Take 1 tablet (5 mg total) by mouth 2 (two) times daily.   ferrous sulfate 325 (65 FE) MG tablet Take 325 mg by mouth 2 (two) times daily with a meal.   fish oil-omega-3 fatty acids 1000 MG capsule Take 1 g by mouth daily at 12 noon.   furosemide 80 MG tablet Commonly known as:  LASIX Take 1 tablet (80 mg total) by mouth daily. What changed:    medication strength  how much to take   glimepiride 2 MG tablet Commonly known as:  AMARYL Take 0.5 tablets (1 mg total) by  mouth daily.   HUMALOG 100 UNIT/ML injection Generic drug:  insulin lispro Inject 8 Units into the skin daily. Take with largest meal   insulin NPH Human 100 UNIT/ML injection Commonly  known as:  HUMULIN N,NOVOLIN N Inject 0.26 mLs (26 Units total) into the skin at bedtime. Uses Vial What changed:    how much to take  additional instructions   metFORMIN 500 MG (MOD) 24 hr tablet Commonly known as:  GLUMETZA Take 1 tablet (500 mg total) by mouth 2 (two) times daily with a meal. (morning & noon)   multivitamin with minerals Tabs tablet Take 1 tablet by mouth daily. Mens One a day Vit   olmesartan 40 MG tablet Commonly known as:  BENICAR Take 1 tablet (40 mg total) by mouth daily.   pantoprazole 40 MG tablet Commonly known as:  PROTONIX Take 1 tablet (40 mg total) by mouth 2 (two) times daily.   pravastatin 80 MG tablet Commonly known as:  PRAVACHOL Take 1 tablet (80 mg total) by mouth every evening.   PRESERVISION AREDS 2 PO Take 1 capsule by mouth 2 (two) times daily.   tamsulosin 0.4 MG Caps capsule Commonly known as:  FLOMAX Take 1 capsule daily   VICTOZA 18 MG/3ML Sopn Generic drug:  liraglutide INJECT 1.8 MG UNDER THE SKIN AT BEDTIME What changed:  See the new instructions.   vitamin C 500 MG tablet Commonly known as:  ASCORBIC ACID Take 500 mg by mouth daily.        Acute coronary syndrome (MI, NSTEMI, STEMI, etc) this admission?: No.     Outstanding Labs/Studies   BMET on 03/10/18  Duration of Discharge Encounter   Greater than 30 minutes including physician time.  Signed, Reino Bellis NP-C 03/07/2018, 1:55 PM

## 2018-03-07 NOTE — Progress Notes (Signed)
Progress Note  Patient Name: Todd Romero Date of Encounter: 03/07/2018  Primary Cardiologist: Sinclair Grooms, MD   Subjective   His family is in town.  They live in [place].  He really wants to go home to be with his family over the holidays.  He feels better than he did when admitted.  He has diuresed 16 to 17 pounds since admission.  He denies orthopnea.  Walked in the hall without dyspnea or dizziness.  Inpatient Medications    Scheduled Meds: . amLODipine  2.5 mg Oral Daily  . apixaban  5 mg Oral BID  . ferrous sulfate  325 mg Oral Q breakfast  . furosemide  80 mg Intravenous BID  . [START ON 03/08/2018] furosemide  80 mg Oral Daily  . glimepiride  1 mg Oral Q breakfast  . insulin aspart  0-20 Units Subcutaneous TID WC  . insulin aspart  0-5 Units Subcutaneous QHS  . insulin NPH Human  26 Units Subcutaneous QHS  . irbesartan  300 mg Oral Daily  . metFORMIN  500 mg Oral BID WC  . multivitamin with minerals  1 tablet Oral Daily  . omega-3 acid ethyl esters  1 g Oral Q1200  . pantoprazole  40 mg Oral BID  . pravastatin  80 mg Oral QPM  . tamsulosin  0.4 mg Oral Daily  . vitamin C  500 mg Oral Daily   Continuous Infusions:  PRN Meds: acetaminophen, ondansetron (ZOFRAN) IV   Vital Signs    Vitals:   03/06/18 0448 03/06/18 1316 03/06/18 2014 03/07/18 0238  BP: 140/72 (!) 148/82 125/66   Pulse:  69 70 70  Resp: 13 20    Temp: 97.7 F (36.5 C) 98 F (36.7 C) 97.9 F (36.6 C) 98 F (36.7 C)  TempSrc: Oral Oral Oral Oral  SpO2: 95% 96% 97% 98%  Weight: 238 lb 1.6 oz (108 kg)   234 lb 4.8 oz (106.3 kg)  Height:        Intake/Output Summary (Last 24 hours) at 03/07/2018 1228 Last data filed at 03/07/2018 1216 Gross per 24 hour  Intake 720 ml  Output 4175 ml  Net -3455 ml   Filed Weights   03/04/18 2107 03/06/18 0448 03/07/18 0238  Weight: 244 lb (110.7 kg) 238 lb 1.6 oz (108 kg) 234 lb 4.8 oz (106.3 kg)    Telemetry    Not working this  morning.  Last recordings demonstrate atrial fib with rate control at rest.- Personally Reviewed  ECG    No new tracing available- Personally Reviewed  Physical Exam  Comfortable, with mild pallor GEN: No acute distress.   Neck:  Moderate JVD with patient lying at 45 degrees. Cardiac: RRR, no murmurs, rubs, or gallops.  Significant bilateral lower extremity edema right leg greater than left.  Abdominal girth has decreased. Respiratory: Clear to auscultation bilaterally. GI: Soft, nontender.  Abdominal girth has decreased. MS: No edema; No deformity. Neuro:  Nonfocal  Psych: Normal affect   Labs    Chemistry Recent Labs  Lab 03/04/18 1324 03/06/18 0408 03/07/18 0231  NA 137 140 138  K 4.4 3.9 3.8  CL 106 105 102  CO2 24 26 27   GLUCOSE 157* 154* 118*  BUN 33* 26* 26*  CREATININE 1.23 1.32* 1.34*  CALCIUM 8.7* 8.8* 8.8*  PROT 6.7  --   --   ALBUMIN 3.7  --   --   AST 21  --   --  ALT 15  --   --   ALKPHOS 70  --   --   BILITOT 0.4  --   --   GFRNONAA 53* 49* 48*  GFRAA >60 57* 56*  ANIONGAP 7 9 9      Hematology Recent Labs  Lab 03/05/18 0618 03/06/18 0408 03/07/18 0231  WBC 6.2 7.0 8.5  RBC 3.35* 3.39* 3.49*  HGB 9.4* 9.6* 9.9*  HCT 30.5* 30.7* 31.5*  MCV 91.0 90.6 90.3  MCH 28.1 28.3 28.4  MCHC 30.8 31.3 31.4  RDW 16.4* 16.3* 16.3*  PLT 164 171 176    Cardiac Enzymes Recent Labs  Lab 03/04/18 1324 03/04/18 2350 03/05/18 0618 03/05/18 1125  TROPONINI 0.03* 0.04* 0.03* 0.03*   No results for input(s): TROPIPOC in the last 168 hours.   BNP Recent Labs  Lab 03/04/18 1324  BNP 261.9*     DDimer No results for input(s): DDIMER in the last 168 hours.   Radiology    No results found.  Cardiac Studies   2D Doppler echocardiogram 03/05/2018: ------------------------------------------------------------------- Study Conclusions   - Left ventricle: The cavity size was normal. Wall thickness was   increased in a pattern of moderate LVH. The  estimated ejection   fraction was 43%. Diffuse hypokinesis. The study is not   technically sufficient to allow evaluation of LV diastolic   function. Ejection fraction (MOD, 2-plane): 43%. - Aortic valve: s/p TAVR valve. No obstruction. Trivial   paravalvular leak. Mean gradient (S): 10 mm Hg. Peak gradient   (S): 24 mm Hg. Valve area (VTI): 1.18 cm^2. Valve area (Vmax):   1.09 cm^2. Valve area (Vmean): 1.2 cm^2. - Left atrium: The atrium was mildly dilated. - Right ventricle: The cavity size was mildly dilated. - Atrial septum: Small atrial septal defect. - Tricuspid valve: There was moderate regurgitation. - Pulmonary arteries: PA peak pressure: 47 mm Hg (S). - Inferior vena cava: The vessel was dilated. The respirophasic   diameter changes were blunted (< 50%), consistent with elevated   central venous pressure.   Impressions:   - Compared to a prior study in 04/2017, the LVEF is lower at 43%   with global hypokinesis. The TAVR valve is non-obstructed with   trivial perivalvular leak and has a mean gradient of 10 mmHg. A   small atrial septal defect is noted. There is moderate TR with an   RVSP of 47 mmHg and a dilated IVC.     Patient Profile     84 y.o. male with a history of mod aortic stenosis, chronic diastolic heart failure, CAD with CTO of D1 and 90% LCX (cath 2013), OSA on CPAP, HTN, type 2 diabetes, and hyperlipidemia.  New atrial fibrillation of uncertain duration precipitating acute decompensation and heart failure.   Assessment & Plan    1. Acute on chronic combined systolic and diastolic heart failure with acute component precipitated by development of atrial fibrillation over the past several weeks to months.  Down 17 pounds since admission.  Still volume overloaded but strongly requesting discharge. 2. New atrial fibrillation with moderate rate control.  Moderate rate controlled.  He has on a telemetry box but it is not working.  With walking in hall heart rate  increases above 100 bpm.  At rest rate is approximately 70 bpm 3. Anticoagulation with Eliquis 4. TAVR approximately 1 year ago with normal valve function 5. Acute kidney injury with stable creatinine despite significant diuresis yesterday.  Stage III.  Discharge home today.  Furosemide  80 mg orally each day.  Daily weights under same circumstances first thing each morning.  Call if weight starts to increase.  Continue amlodipine 2.5 mg/day Benicar 40 mg/day, and prior Lasix dose 20 mg/day has been quadrupled as noted above.  Other medications should remain the same as on admission.  Transition of care follow-up in 1 week.  Basic metabolic panel in 3 to 5 days.  Also needs a basic metabolic panel at [facility] appointment.  He should call if swelling, shortness of breath, lightheadedness/dizziness, or low blood pressures below 100 mmHg.  For questions or updates, please contact [place] Please consult [email or web address] for contact info under Cardiology/STEMI.      Signed, Sinclair Grooms, MD  03/07/2018, 12:28 PM  \

## 2018-03-10 ENCOUNTER — Other Ambulatory Visit: Payer: Medicare Other | Admitting: *Deleted

## 2018-03-10 DIAGNOSIS — Z79899 Other long term (current) drug therapy: Secondary | ICD-10-CM

## 2018-03-10 LAB — BASIC METABOLIC PANEL
BUN / CREAT RATIO: 28 — AB (ref 10–24)
BUN: 43 mg/dL — ABNORMAL HIGH (ref 8–27)
CO2: 20 mmol/L (ref 20–29)
CREATININE: 1.52 mg/dL — AB (ref 0.76–1.27)
Calcium: 8.9 mg/dL (ref 8.6–10.2)
Chloride: 103 mmol/L (ref 96–106)
GFR, EST AFRICAN AMERICAN: 49 mL/min/{1.73_m2} — AB (ref >59)
GFR, EST NON AFRICAN AMERICAN: 42 mL/min/{1.73_m2} — AB (ref >59)
GLUCOSE: 165 mg/dL — AB (ref 65–99)
Potassium: 4.7 mmol/L (ref 3.5–5.2)
SODIUM: 139 mmol/L (ref 134–144)

## 2018-03-13 ENCOUNTER — Telehealth: Payer: Self-pay | Admitting: Interventional Cardiology

## 2018-03-13 NOTE — Progress Notes (Signed)
[facility]                                       Cardiology Office Note    Date:  03/15/2018   ID:  Todd Romero, DOB [birth year], MRN [id number]  PCP:  [name], MD  Cardiologist: Sinclair Grooms, MD / Dr. Angelena Form & Dr. Roxy Manns (TAVR)  CC: 1 year follow up s/p TAVR  History of Present Illness:  Todd Romero is a 84 y.o. male with a history of chronic combined S/D heart failure, CAD, CKD, OSA on CPAP, HTN, type 2 diabetes, HLD, persistent atrial fib on Eliquis and severe AS s/p TAVR (03/2017) who presents to clinic for follow up.   He underwent successful TAVR with a 29 mm Edwards Sapien THV via the TF approach in 03/2017. He did well postoperatively.   He was admitted for GI bleed w/ duodenal and gastric ulcers along w/ esophagitis in 11/2017. He was recently readmitted 6/29-03/07/18 for acute CHF and newly diagnosed afib. His asa and plavix were discontinued and he was started on Eliquis 5mg  BID. Echo this admission did show a mild decline in EF to 43% with global hypokinesis, moderate TR and pulm HTN. TAVR valve was functioning normally with trivial PVL and mean gradient of 10 mm Hg. Lasix was increased to Lasix 80mg  daily. Plan is for DCCV after 3-4 weeks of oral anticoagulation.   BMET on 03/10/18 showed increase in creat and lasix decreased to 40mg  daily.   Today he presents to clinic for 1 year follow up after his TAVR. He was doing really well since valve surgery until recently with his admissions for GI bleeding and CHF/afib. He thinks he will feel better once back out afib. No CP. He has had worsening shortness of breath, LE edema and orthopnea and PND. Weight Is creeping up since lasix cut back.  No dizziness or syncope. No blood in stool or urine. No palpitations.    Past Medical History:  Diagnosis Date  . Anemia   . Arthritis   . CAD (coronary artery disease)    a. cardiac cath 12/2016 showing moderate AS,  elevated LVEDP, heavy 3V coronary calcification, 100% mD2, 30-50% LAD, 50-90% stenosis of Cx proximal to origin of L-PDA, RCA not engaged due to poor catheter control (difficult procedure) - coronary status was essentially unchanged from prior.  . Chronic diastolic CHF (congestive heart failure) ([place])   . Degenerative arthritis   . Diabetes ([place])   . Dyspnea   . Essential hypertension   . Hyperlipidemia   . Obesity (BMI 30-39.9) 06/18/2015  . OSA (obstructive sleep apnea)    Severe with AHI 27/hr now on CPAP  no cpap  . Peritonitis ([place]) 1985?  [name] Pneumonia    "6 months - 84 years old"  . Pure hypercholesterolemia   . RBBB   . [facility] spotted fever   . 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 Severe aortic stenosis    a. s/p TAVR 03/2017.  . Type II or unspecified type diabetes mellitus without mention of complication, not stated as uncontrolled     Past Surgical History:  Procedure Laterality Date  . APPENDECTOMY    . CARPAL TUNNEL RELEASE    .  COLECTOMY     partial  . COLONOSCOPY WITH PROPOFOL N/A 11/18/2017   Procedure: COLONOSCOPY WITH PROPOFOL;  Surgeon: Carol Ada, MD;  Location: [facility];  Service: Endoscopy;  Laterality: N/A;  . ESOPHAGOGASTRODUODENOSCOPY N/A 11/18/2017   Procedure: ESOPHAGOGASTRODUODENOSCOPY (EGD);  Surgeon: Carol Ada, MD;  Location: [facility];  Service: Endoscopy;  Laterality: N/A;  . EYE SURGERY Bilateral    cataracts removed, lens placed  . JOINT REPLACEMENT    . KNEE CARTILAGE SURGERY    . PARTIAL COLECTOMY    . RIGHT/LEFT HEART CATH AND CORONARY ANGIOGRAPHY N/A 12/28/2016   Procedure: Right/Left Heart Cath and Coronary Angiography;  Surgeon: Belva Crome, MD;  Location: [facility];  Service: Cardiovascular;  Laterality: N/A;  . TEE WITHOUT CARDIOVERSION N/A 03/15/2017   Procedure: TRANSESOPHAGEAL  ECHOCARDIOGRAM (TEE);  Surgeon: Burnell Blanks, MD;  Location: [facility];  Service: Open Heart Surgery;  Laterality: N/A;  . TOTAL KNEE ARTHROPLASTY Right 11/08/2017  . TOTAL KNEE ARTHROPLASTY Right 11/08/2017   Procedure: TOTAL KNEE ARTHROPLASTY;  Surgeon: Melrose Nakayama, MD;  Location: [facility];  Service: Orthopedics;  Laterality: Right;  . TRANSCATHETER AORTIC VALVE REPLACEMENT, TRANSFEMORAL N/A 03/15/2017   Procedure: TRANSCATHETER AORTIC VALVE REPLACEMENT, TRANSFEMORAL;  Surgeon: Burnell Blanks, MD;  Location: [facility];  Service: Open Heart Surgery;  Laterality: N/A;  . VEIN SURGERY      Current Medications: Outpatient Medications Prior to Visit  Medication Sig Dispense Refill  . acetaminophen (TYLENOL) 500 MG tablet Take 500 mg by mouth every 4 (four) hours as needed for moderate pain or headache.    [name] amLODipine (NORVASC) 2.5 MG tablet Take 2.5 mg by mouth daily.    [name] apixaban (ELIQUIS) 5 MG TABS tablet Take 1 tablet (5 mg total) by mouth 2 (two) times daily. 60 tablet 2  . ferrous sulfate 325 (65 FE) MG tablet Take 325 mg by mouth 2 (two) times daily with a meal.     . fish oil-omega-3 fatty acids 1000 MG capsule Take 1 g by mouth daily at 12 noon.     [name] glimepiride (AMARYL) 2 MG tablet Take 0.5 tablets (1 mg total) by mouth daily. 45 tablet 3  . insulin lispro (HUMALOG) 100 UNIT/ML injection Inject 8 Units into the skin daily. Take with largest meal    . insulin NPH Human (HUMULIN N,NOVOLIN N) 100 UNIT/ML injection Inject 0.26 mLs (26 Units total) into the skin at bedtime. Uses Vial (Patient taking differently: Inject 22-26 Units into the skin at bedtime. Takes 22 units at bedtime but injects 26 units if blood sugar is high) 20 mL 3  . metFORMIN (GLUMETZA) 500 MG (MOD) 24 hr tablet Take 1 tablet (500 mg total) by mouth 2 (two) times daily with a meal. (morning & noon) 180 tablet 4  . Multiple Vitamin (MULTIVITAMIN WITH MINERALS) TABS tablet Take 1 tablet by mouth daily. Mens One a  day Vit    . Multiple Vitamins-Minerals (PRESERVISION AREDS 2 PO) Take 1 capsule by mouth 2 (two) times daily.    [name] olmesartan (BENICAR) 40 MG tablet Take 1 tablet (40 mg total) by mouth daily. 90 tablet 3  . pantoprazole (PROTONIX) 40 MG tablet Take 1 tablet (40 mg total) by mouth 2 (two) times daily. 60 tablet 0  . pravastatin (PRAVACHOL) 80 MG tablet Take 1 tablet (80 mg total) by mouth every evening. 90 tablet 1  . tamsulosin (FLOMAX) 0.4 MG CAPS capsule Take 1 capsule daily 90 capsule 1  .  VICTOZA 18 MG/3ML SOPN INJECT 1.8 MG UNDER THE SKIN AT BEDTIME 9 pen 2  . vitamin C (ASCORBIC ACID) 500 MG tablet Take 500 mg by mouth daily.    . furosemide (LASIX) 80 MG tablet Take 40 mg by mouth.    . furosemide (LASIX) 80 MG tablet Take 1 tablet (80 mg total) by mouth daily. (Patient not taking: Reported on 03/15/2018) 30 tablet 2   No facility-administered medications prior to visit.      Allergies:   Bactrim [sulfamethoxazole-trimethoprim] and Morphine and related   Social History   Socioeconomic History  . Marital status: Married    Spouse name: Not on file  . Number of children: 4  . Years of education: Not on file  . Highest education level: Not on file  Occupational History  . Occupation: Construction-Retired  Social Needs  . Financial resource strain: Not on file  . Food insecurity:    Worry: Not on file    Inability: Not on file  . Transportation needs:    Medical: Not on file    Non-medical: Not on file  Tobacco Use  . Smoking status: Never Smoker  . Smokeless tobacco: Never Used  Substance and Sexual Activity  . Alcohol use: No  . Drug use: No  . Sexual activity: Not on file  Lifestyle  . Physical activity:    Days per week: Not on file    Minutes per session: Not on file  . Stress: Not on file  Relationships  . Social connections:    Talks on phone: Not on file    Gets together: Not on file    Attends religious service: Not on file    Active member of club or  organization: Not on file    Attends meetings of clubs or organizations: Not on file    Relationship status: Not on file  Other Topics Concern  . Not on file  Social History Narrative  . Not on file     Family History:  The patient's family history includes Cancer in his sister; Emphysema in his father; Healthy in his sister; Heart failure in his father; Hypertension in his mother.      ROS:   Please see the history of present illness.    ROS All other systems reviewed and are negative.   PHYSICAL EXAM:   VS:  BP (!) 132/54   Pulse 63   Ht 5\' 8"  (1.727 m)   Wt 249 lb (112.9 kg)   SpO2 96%   BMI 37.86 kg/m    GEN: Well nourished, well developed, in no acute distress  HEENT: normal  Neck: no JVD, carotid bruits, or masses Cardiac: irreg irreg; no murmurs, rubs, or gallops. 2 + bilateral LE edema Respiratory:  clear to auscultation bilaterally, normal work of breathing GI: soft, nontender, nondistended, + BS MS: no deformity or atrophy  Skin: warm and dry, no rash.  Neuro:  Alert and Oriented x 3, Strength and sensation are intact Psych: euthymic mood, full affect   Wt Readings from Last 3 Encounters:  03/15/18 249 lb (112.9 kg)  03/07/18 234 lb 4.8 oz (106.3 kg)  02/15/18 235 lb 9.6 oz (106.9 kg)      Studies/Labs Reviewed:   EKG:  EKG is NOT ordered today.    Recent Labs: 03/04/2018: ALT 15; B Natriuretic Peptide 261.9; TSH 0.876 03/06/2018: Magnesium 2.0 03/07/2018: Hemoglobin 9.9; Platelets 176 03/10/2018: BUN 43; Creatinine, Ser 1.52; Potassium 4.7; Sodium 139   Lipid  Panel    Component Value Date/Time   CHOL 127 02/10/2018 0956   TRIG 104.0 02/10/2018 0956   HDL 46.30 02/10/2018 0956   CHOLHDL 3 02/10/2018 0956   VLDL 20.8 02/10/2018 0956   LDLCALC 60 02/10/2018 0956   LDLDIRECT 101.0 04/26/2016 0915    Additional studies/ records that were reviewed today include:   TAVR OPERATIVE NOTE   Date of Procedure:                03/15/2017  Preoperative  Diagnosis:      Severe Aortic Stenosis   Procedure:        Transcatheter Aortic Valve Replacement - Percutaneous Left Transfemoral Approach             Edwards Sapien 3 THV (size 29 mm, model # 9600TFX, serial # [id number])              Co-Surgeons:                        Lauree Chandler, MD and Valentina Gu. Roxy Manns, MD   Pre-operative Echo Findings: ? Severe aortic stenosis ? Normal left ventricular systolic function  Post-operative Echo Findings: ? No paravalvular leak ? Normal left ventricular systolic function  ________________   2D ECHO 04/20/17 (1 month s/p TAVR) Study Conclusions - Left ventricle: Wall thickness was increased in a pattern of mild   LVH. Systolic function was normal. The estimated ejection   fraction was in the range of 55% to 60%. Wall motion was normal;   there were no regional wall motion abnormalities. Features are   consistent with a pseudonormal left ventricular filling pattern,   with concomitant abnormal relaxation and increased filling   pressure (grade 2 diastolic dysfunction). - Aortic valve: A bioprosthesis was present. Valve area (VTI): 1.9   cm^2. Valve area (Vmax): 1.86 cm^2. Valve area (Vmean): 1.71   cm^2. - Mitral valve: Mildly calcified annulus. - Left atrium: The atrium was moderately dilated. - Right atrium: The atrium was mildly dilated.  ________________  2D ECHO 03/05/2018 (1 year s/p TAVR) Study Conclusions - Left ventricle: The cavity size was normal. Wall thickness was   increased in a pattern of moderate LVH. The estimated ejection   fraction was 43%. Diffuse hypokinesis. The study is not   technically sufficient to allow evaluation of LV diastolic   function. Ejection fraction (MOD, 2-plane): 43%. - Aortic valve: s/p TAVR valve. No obstruction. Trivial   paravalvular leak. Mean gradient (S): 10 mm Hg. Peak gradient   (S): 24 mm Hg. Valve area (VTI): 1.18 cm^2. Valve area (Vmax):   1.09 cm^2. Valve area (Vmean): 1.2  cm^2. - Left atrium: The atrium was mildly dilated. - Right ventricle: The cavity size was mildly dilated. - Atrial septum: Small atrial septal defect. - Tricuspid valve: There was moderate regurgitation. - Pulmonary arteries: PA peak pressure: 47 mm Hg (S). - Inferior vena cava: The vessel was dilated. The respirophasic   diameter changes were blunted (< 50%), consistent with elevated   central venous pressure. Impressions: - Compared to a prior study in 04/2017, the LVEF is lower at 43%   with global hypokinesis. The TAVR valve is non-obstructed with   trivial perivalvular leak and has a mean gradient of 10 mmHg. A   small atrial septal defect is noted. There is moderate TR with an   RVSP of 47 mmHg and a dilated IVC.   ASSESSMENT & PLAN:   Severe AS  s/p TAVR: doing well. 1 year echo showed valve is functioning well with trivial PVL and mean gradient of 10 mm Hg. He is on Eliquis for afib. He is aware of the need for SBE prophylaxis  Acute on chronic combined S/D CHF: EF 43%. He was discharged on 80mg  of lasix but this was cut back to 40mg  daily on 03/10/18 given creat bump 1.34--> 1.52. He has been gaining weight and has s/s CHF with worsening LE edema, orthopnea. I will get a BMET today. I think we may have to accept a higher creatinine to keep fluid off. Will increase lasix back to 80mg  daily. If he continues to worsen despite a higher lasix dose we may need to think of doing a TEE/DCCV. Plan for repeat BMET at previously scheduled follow up 7/23 with Marcellina Millin NP.   HTN: BP well controlled today.   CAD: continue medical therapy.   CKD: creat up to 1.52 and lasix decreased. Will check a BMET today.   Atrial fibrillation: plan is for DCCV after 3-4 weeks of oral anticoagulation. Continue Eliquis. He see's Truitt Merle NP later this month.   Medication Adjustments/Labs and Tests Ordered: Current medicines are reviewed at length with the patient today.  Concerns regarding medicines  are outlined above.  Medication changes, Labs and Tests ordered today are listed in the Patient Instructions below. Patient Instructions  Medication Instructions:  Your physician has recommended you make the following change in your medication: Increase furosemide to 80 mg by mouth daily.    Labwork: Lab work to be done today--BMP  Testing/Procedures: none  Follow-Up: Follow up with Truitt Merle, NP as planned on July 23,2019  Any Other Special Instructions Will Be Listed Below (If Applicable).     If you need a refill on your cardiac medications before your next appointment, please call your pharmacy.      Signed, Angelena Form, PA-C  03/15/2018 5:13 PM    [facility] [address], [place], [place]  [address] Phone: [phone]; Fax: [phone]

## 2018-03-13 NOTE — Telephone Encounter (Signed)
Spoke with Dr. Tamala Julian and he said less than 2L/day.  Spoke with wife, DPR on file.  Advised her I will cancel echo for Wednesday since he had one in the hospital.  Made her aware of fluid intake recommendations.  She verbalized understanding and was appreciative for call.

## 2018-03-13 NOTE — Telephone Encounter (Signed)
New Message   Pt's wife is calling, wanting to know if he still needs to keep his echo appt even though he had an echo done in the hospital last week. Also has questions about pt's fluid intake because his Kidney #'s seem to be going up. Please call

## 2018-03-15 ENCOUNTER — Encounter: Payer: Self-pay | Admitting: Physician Assistant

## 2018-03-15 ENCOUNTER — Other Ambulatory Visit (HOSPITAL_COMMUNITY): Payer: Medicare Other

## 2018-03-15 ENCOUNTER — Ambulatory Visit (INDEPENDENT_AMBULATORY_CARE_PROVIDER_SITE_OTHER): Payer: Medicare Other | Admitting: Physician Assistant

## 2018-03-15 VITALS — BP 132/54 | HR 63 | Ht 68.0 in | Wt 249.0 lb

## 2018-03-15 DIAGNOSIS — I481 Persistent atrial fibrillation: Secondary | ICD-10-CM

## 2018-03-15 DIAGNOSIS — I5042 Chronic combined systolic (congestive) and diastolic (congestive) heart failure: Secondary | ICD-10-CM

## 2018-03-15 DIAGNOSIS — N189 Chronic kidney disease, unspecified: Secondary | ICD-10-CM

## 2018-03-15 DIAGNOSIS — Z952 Presence of prosthetic heart valve: Secondary | ICD-10-CM

## 2018-03-15 DIAGNOSIS — I2510 Atherosclerotic heart disease of native coronary artery without angina pectoris: Secondary | ICD-10-CM | POA: Diagnosis not present

## 2018-03-15 DIAGNOSIS — I10 Essential (primary) hypertension: Secondary | ICD-10-CM | POA: Diagnosis not present

## 2018-03-15 DIAGNOSIS — I4819 Other persistent atrial fibrillation: Secondary | ICD-10-CM

## 2018-03-15 MED ORDER — FUROSEMIDE 80 MG PO TABS: 80.0000 mg | ORAL_TABLET | Freq: Every day | ORAL | 3 refills | Status: DC

## 2018-03-15 NOTE — Patient Instructions (Addendum)
Medication Instructions:  Your physician has recommended you make the following change in your medication: Increase furosemide to 80 mg by mouth daily.    Labwork: Lab work to be done today--BMP  Testing/Procedures: none  Follow-Up: Follow up with Truitt Merle, NP as planned on July 23,2019  Any Other Special Instructions Will Be Listed Below (If Applicable).     If you need a refill on your cardiac medications before your next appointment, please call your pharmacy.

## 2018-03-16 LAB — BASIC METABOLIC PANEL
BUN / CREAT RATIO: 32 — AB (ref 10–24)
BUN: 38 mg/dL — AB (ref 8–27)
CO2: 21 mmol/L (ref 20–29)
CREATININE: 1.18 mg/dL (ref 0.76–1.27)
Calcium: 9.1 mg/dL (ref 8.6–10.2)
Chloride: 105 mmol/L (ref 96–106)
GFR, EST AFRICAN AMERICAN: 67 mL/min/{1.73_m2} (ref >59)
GFR, EST NON AFRICAN AMERICAN: 58 mL/min/{1.73_m2} — AB (ref >59)
Glucose: 147 mg/dL — ABNORMAL HIGH (ref 65–99)
Potassium: 4.6 mmol/L (ref 3.5–5.2)
Sodium: 142 mmol/L (ref 134–144)

## 2018-03-17 ENCOUNTER — Telehealth: Payer: Self-pay

## 2018-03-17 NOTE — Telephone Encounter (Signed)
Spoke with patient he will come for labs on Aug 8th. In the morning.

## 2018-03-20 ENCOUNTER — Telehealth: Payer: Self-pay | Admitting: Interventional Cardiology

## 2018-03-20 NOTE — Telephone Encounter (Signed)
Spoke with wife, DPR on file.  She states since pt got out of the hospital his weight has been slowly trending up.  Weight at home at discharge was 230lbs, pt now up to 240lbs.  Has some SOB when laying down so he sleeps in the recliner.  Swelling in feet, ankles and some in abd.  No BP readings available but pt does not complain of lightheadedness or dizziness.  Pt has appt 7/23 with Truitt Merle, NP.  Wife wanted to know if possible to give more Furosemide until pt is seen.

## 2018-03-21 NOTE — Telephone Encounter (Signed)
Needs TEE cardioversion after diuresis in hospital. When does he want to come back into hospital?

## 2018-03-22 NOTE — Telephone Encounter (Signed)
Spoke with wife and went over results and recommendations per Dr. Tamala Julian.  I have scheduled pt to come in 7/19 with Cecilie Kicks, NP to discuss.  Wife verbalized understanding and was in agreement with this plan.

## 2018-03-23 NOTE — Progress Notes (Signed)
Cardiology Office Note   Date:  03/24/2018   ID:  Todd Romero, DOB 06-04-1934, MRN [id number]  PCP:  [name], MD  Cardiologist:  Dr. Tamala Julian    Chief Complaint  Patient presents with  . Leg Swelling      History of Present Illness: Todd Romero is a 84 y.o. male who presents for atrial fib and increasing SOB still with mod overload at discharge.  Wt at discharge 234, lasat visit wt up 249 and lasix increased back to 80 mg dialy.  On Eliquis.   He has a history of chronic combined S/D heart failure, CAD, CKD, OSA on CPAP, HTN, type 2 diabetes, HLD, persistent atrial fib on Eliquis and severe AS s/p TAVR (03/2017)   Last echo while in hospital with a fib with decrease in EF  To 43%  Moderate LVH, TAVR valve was stable.  PA Pk pressure was 47 mmHg.  Moderate TR.    In the hospital was diuresed and  Pt has loss of 10 lbs to wt of 234.  He did have rise in Cr with diuresing.  He has been seen back with increasing edema and lasix has been increased to 80 AM and 40 mg PM --despite this he continues to gain wt and has edema into his thighs he feels his abd is larger as well.  When he lies flat he gasps for air and has to sit up.  He has been decreasing salt.  No chest pain.  Dr. Tamala Julian is having him seen today to eval for hospitalization for diuresing and then TEE DCCV.      Past Medical History:  Diagnosis Date  . Anemia   . Arthritis   . CAD (coronary artery disease)    a. cardiac cath 12/2016 showing moderate AS, elevated LVEDP, heavy 3V coronary calcification, 100% mD2, 30-50% LAD, 50-90% stenosis of Cx proximal to origin of L-PDA, RCA not engaged due to poor catheter control (difficult procedure) - coronary status was essentially unchanged from prior.  . Chronic diastolic CHF (congestive heart failure) ([place])   . Degenerative arthritis   . Diabetes ([place])   . Dyspnea   . Essential hypertension   . Hyperlipidemia   . Obesity (BMI 30-39.9) 06/18/2015  . OSA  (obstructive sleep apnea)    Severe with AHI 27/hr now on CPAP  no cpap  . Peritonitis ([place]) 1985?  [name] Pneumonia    "6 months - 84 years old"  . Pure hypercholesterolemia   . RBBB   . [employer] spotted fever   . 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 Severe aortic stenosis    a. s/p TAVR 03/2017.  . Type II or unspecified type diabetes mellitus without mention of complication, not stated as uncontrolled     Past Surgical History:  Procedure Laterality Date  . APPENDECTOMY    . CARPAL TUNNEL RELEASE    . COLECTOMY     partial  . COLONOSCOPY WITH PROPOFOL N/A 11/18/2017   Procedure: COLONOSCOPY WITH PROPOFOL;  Surgeon: Carol Ada, MD;  Location: [facility];  Service: Endoscopy;  Laterality: N/A;  . ESOPHAGOGASTRODUODENOSCOPY N/A 11/18/2017   Procedure: ESOPHAGOGASTRODUODENOSCOPY (EGD);  Surgeon: Carol Ada, MD;  Location: [facility];  Service: Endoscopy;  Laterality: N/A;  . EYE SURGERY Bilateral    cataracts removed, lens placed  . JOINT REPLACEMENT    . KNEE  CARTILAGE SURGERY    . PARTIAL COLECTOMY    . RIGHT/LEFT HEART CATH AND CORONARY ANGIOGRAPHY N/A 12/28/2016   Procedure: Right/Left Heart Cath and Coronary Angiography;  Surgeon: Belva Crome, MD;  Location: [facility];  Service: Cardiovascular;  Laterality: N/A;  . TEE WITHOUT CARDIOVERSION N/A 03/15/2017   Procedure: TRANSESOPHAGEAL ECHOCARDIOGRAM (TEE);  Surgeon: Burnell Blanks, MD;  Location: [facility];  Service: Open Heart Surgery;  Laterality: N/A;  . TOTAL KNEE ARTHROPLASTY Right 11/08/2017  . TOTAL KNEE ARTHROPLASTY Right 11/08/2017   Procedure: TOTAL KNEE ARTHROPLASTY;  Surgeon: Melrose Nakayama, MD;  Location: [facility];  Service: Orthopedics;  Laterality: Right;  . TRANSCATHETER AORTIC VALVE REPLACEMENT, TRANSFEMORAL N/A 03/15/2017   Procedure: TRANSCATHETER AORTIC VALVE  REPLACEMENT, TRANSFEMORAL;  Surgeon: Burnell Blanks, MD;  Location: [facility];  Service: Open Heart Surgery;  Laterality: N/A;  . VEIN SURGERY       Current Outpatient Medications  Medication Sig Dispense Refill  . acetaminophen (TYLENOL) 500 MG tablet Take 500 mg by mouth every 4 (four) hours as needed for moderate pain or headache.    [name] amLODipine (NORVASC) 2.5 MG tablet Take 2.5 mg by mouth daily.    [name] apixaban (ELIQUIS) 5 MG TABS tablet Take 1 tablet (5 mg total) by mouth 2 (two) times daily. 60 tablet 2  . ferrous sulfate 325 (65 FE) MG tablet Take 325 mg by mouth 2 (two) times daily with a meal.     . fish oil-omega-3 fatty acids 1000 MG capsule Take 1 g by mouth daily at 12 noon.     . furosemide (LASIX) 80 MG tablet Take 1 tablet (80 mg total) by mouth daily. (Patient taking differently: Take 80 mg by mouth daily. Take one pill in the morning an half in the afternoon.) 90 tablet 3  . glimepiride (AMARYL) 2 MG tablet Take 0.5 tablets (1 mg total) by mouth daily. 45 tablet 3  . insulin lispro (HUMALOG) 100 UNIT/ML injection Inject 8 Units into the skin daily. Take with largest meal    . insulin NPH Human (HUMULIN N,NOVOLIN N) 100 UNIT/ML injection Inject 0.26 mLs (26 Units total) into the skin at bedtime. Uses Vial (Patient taking differently: Inject 22-26 Units into the skin at bedtime. Takes 22 units at bedtime but injects 26 units if blood sugar is high) 20 mL 3  . metFORMIN (GLUMETZA) 500 MG (MOD) 24 hr tablet Take 1 tablet (500 mg total) by mouth 2 (two) times daily with a meal. (morning & noon) 180 tablet 4  . Multiple Vitamin (MULTIVITAMIN WITH MINERALS) TABS tablet Take 1 tablet by mouth daily. Mens One a day Vit    . Multiple Vitamins-Minerals (PRESERVISION AREDS 2 PO) Take 1 capsule by mouth 2 (two) times daily.    [name] olmesartan (BENICAR) 40 MG tablet Take 1 tablet (40 mg total) by mouth daily. 90 tablet 3  . pantoprazole (PROTONIX) 40 MG tablet Take 1 tablet (40 mg total) by  mouth 2 (two) times daily. 60 tablet 0  . pravastatin (PRAVACHOL) 80 MG tablet Take 1 tablet (80 mg total) by mouth every evening. 90 tablet 1  . tamsulosin (FLOMAX) 0.4 MG CAPS capsule Take 1 capsule daily 90 capsule 1  . VICTOZA 18 MG/3ML SOPN INJECT 1.8 MG UNDER THE SKIN AT BEDTIME 9 pen 2  . vitamin C (ASCORBIC ACID) 500 MG tablet Take 500 mg by mouth daily.     No current facility-administered medications for this visit.  Allergies:   Bactrim [sulfamethoxazole-trimethoprim] and Morphine and related    Social History:  The patient  reports that he has never smoked. He has never used smokeless tobacco. He reports that he does not drink alcohol or use drugs.   Family History:  The patient's family history includes Cancer in his sister; Emphysema in his father; Healthy in his sister; Heart failure in his father; Hypertension in his mother.    ROS:  General:no colds or fevers, + weight gain with edema Skin:no rashes or ulcers HEENT:no blurred vision, no congestion CV:see HPI PUL:see HPI GI:no diarrhea constipation or melena, no indigestion GU:no hematuria, no dysuria MS:no joint pain, no claudication Neuro:no syncope, no lightheadedness Endo:+ diabetes, no thyroid disease  Wt Readings from Last 3 Encounters:  03/24/18 251 lb (113.9 kg)  03/15/18 249 lb (112.9 kg)  03/07/18 234 lb 4.8 oz (106.3 kg)     PHYSICAL EXAM: VS:  BP 114/64   Pulse 81   Ht 5\' 8"  (1.727 m)   Wt 251 lb (113.9 kg)   SpO2 93%   BMI 38.16 kg/m  , BMI Body mass index is 38.16 kg/m. General:Pleasant affect, NAD Skin:Warm and dry, brisk capillary refill HEENT:normocephalic, sclera clear, mucus membranes moist Neck:supple, no JVD, no bruits  Heart:irreg irreg without murmur, gallup, rub or click Lungs:clear without rales, rhonchi, or wheezes YQI:HKVQ, non tender, + BS, do not palpate liver spleen or masses Ext:3-4+ lower ext edema, very tight below the knees and edema into thighs. 2+ radial  pulses Neuro:alert and oriented X 3, MAE, follows commands, + facial symmetry    EKG:  EKG is ordered today. The ekg ordered today demonstrates A fib with controlled rate RBBB and PVC    Recent Labs: 03/04/2018: ALT 15; B Natriuretic Peptide 261.9; TSH 0.876 03/06/2018: Magnesium 2.0 03/07/2018: Hemoglobin 9.9; Platelets 176 03/15/2018: BUN 38; Creatinine, Ser 1.18; Potassium 4.6; Sodium 142    Lipid Panel    Component Value Date/Time   CHOL 127 02/10/2018 0956   TRIG 104.0 02/10/2018 0956   HDL 46.30 02/10/2018 0956   CHOLHDL 3 02/10/2018 0956   VLDL 20.8 02/10/2018 0956   LDLCALC 60 02/10/2018 0956   LDLDIRECT 101.0 04/26/2016 0915       Other studies Reviewed: Additional studies/ records that were reviewed today include: . 2D ECHO 03/05/2018 (1 year s/p TAVR) Study Conclusions - Left ventricle: The cavity size was normal. Wall thickness was increased in a pattern of moderate LVH. The estimated ejection fraction was 43%. Diffuse hypokinesis. The study is not technically sufficient to allow evaluation of LV diastolic function. Ejection fraction (MOD, 2-plane): 43%. - Aortic valve: s/p TAVR valve. No obstruction. Trivial paravalvular leak. Mean gradient (S): 10 mm Hg. Peak gradient (S): 24 mm Hg. Valve area (VTI): 1.18 cm^2. Valve area (Vmax): 1.09 cm^2. Valve area (Vmean): 1.2 cm^2. - Left atrium: The atrium was mildly dilated. - Right ventricle: The cavity size was mildly dilated. - Atrial septum: Small atrial septal defect. - Tricuspid valve: There was moderate regurgitation. - Pulmonary arteries: PA peak pressure: 47 mm Hg (S). - Inferior vena cava: The vessel was dilated. The respirophasic diameter changes were blunted (<50%), consistent with elevated central venous pressure. Impressions: - Compared to a prior study in 04/2017, the LVEF is lower at 43% with global hypokinesis. The TAVR valve is non-obstructed with trivial perivalvular leak and  has a mean gradient of 10 mmHg. A small atrial septal defect is noted. There is moderate TR with an RVSP  of 47 mmHg and a dilated IVC.  Cardiac cath 4/18  Difficult procedure due to severe angulation in the right innominate/ascending aortic region that produced catheter resistance to torque and advancement.  Moderate aortic stenosis with a transvalvular gradient of 29 mmHg (mean) and 33-38 mmHg (peak to peak). Calculated aortic valve area 1.35 cm.  Elevated left ventricular end-diastolic pressure and pulmonary capillary wedge pressure. Known LV systolic function with EF greater than 50% by recent echo suggesting chronic diastolic left heart failure.  Heavy three-vessel coronary calcification.  100% stenosis in the mid body of the second diagonal. There is diffuse 30-50% narrowing in the LAD.  The circumflex coronary is dominant. Moderate to heavy calcification is noted throughout the distal vessel and in the proximal segment. There is eccentric 50-90% stenosis proximal to the origin of the L-PDA.  Nondominant right coronary. Not selectively engaged due to poor catheter control. The vessel is patent and supplies only the right ventricle.  RECOMMENDATIONS:   Coronary status is essentially unchanged compared to 5 years ago.  Moderate aortic stenosis based upon hemodynamics obtained from this case. This matches the recent echo.  Explanation for exertional fatigue is uncertain. Consider referral to the valve clinic for a second opinion concerning management of aortic stenosis. May need dobutamine echo as he is potentially a low gradient severe aortic stenosis substrate. ASSESSMENT AND PLAN:  1.  Acute on chronic HF secondary to a fib.  He will need to be admitted, Dr. Marlou Porch and I discussed timing and the pt would prefer Monday.  I explained if he had increase in symptoms to come to ER.Marland KitchenHe and his wife understand.   In the meantime will increase lasix to 120 mg in AM and 40 in PM,  check BMP today.  Admit on Monday.  Will add IV lasix in hospital. Also may need to change benicar to entresto.    2.  A fib rate controlled.  On Eliquis, once diuresed will plan for TEE DCCV  3.  SOB check pro BNP   4.  TAVR for severe AS, and valve is doing well.   5.  DM-2 insulin dependent.  Stable.   6.   HLD continue statin.   7.   CAD with last cath 12/28/16 with 3 vessel coronary calcification.  But stable. No angina.   Current medicines are reviewed with the patient today.  The patient Has no concerns regarding medicines.  The following changes have been made:  See above Labs/ tests ordered today include:see above  Disposition:   FU:  see above  Signed, Cecilie Kicks, NP  03/24/2018 10:14 AM    [facility], [place], [place] [place], [place] Phone: [phone]; Fax: [phone]

## 2018-03-24 ENCOUNTER — Ambulatory Visit (INDEPENDENT_AMBULATORY_CARE_PROVIDER_SITE_OTHER): Payer: Medicare Other | Admitting: Cardiology

## 2018-03-24 ENCOUNTER — Encounter: Payer: Self-pay | Admitting: Cardiology

## 2018-03-24 VITALS — BP 114/64 | HR 81 | Ht 68.0 in | Wt 251.0 lb

## 2018-03-24 DIAGNOSIS — I2510 Atherosclerotic heart disease of native coronary artery without angina pectoris: Secondary | ICD-10-CM

## 2018-03-24 DIAGNOSIS — I4891 Unspecified atrial fibrillation: Secondary | ICD-10-CM

## 2018-03-24 DIAGNOSIS — I5041 Acute combined systolic (congestive) and diastolic (congestive) heart failure: Secondary | ICD-10-CM | POA: Diagnosis not present

## 2018-03-24 DIAGNOSIS — Z952 Presence of prosthetic heart valve: Secondary | ICD-10-CM | POA: Diagnosis not present

## 2018-03-24 DIAGNOSIS — I5032 Chronic diastolic (congestive) heart failure: Secondary | ICD-10-CM

## 2018-03-24 DIAGNOSIS — R0609 Other forms of dyspnea: Secondary | ICD-10-CM

## 2018-03-24 LAB — BASIC METABOLIC PANEL
BUN / CREAT RATIO: 23 (ref 10–24)
BUN: 31 mg/dL — AB (ref 8–27)
CHLORIDE: 103 mmol/L (ref 96–106)
CO2: 24 mmol/L (ref 20–29)
Calcium: 8.7 mg/dL (ref 8.6–10.2)
Creatinine, Ser: 1.33 mg/dL — ABNORMAL HIGH (ref 0.76–1.27)
GFR calc Af Amer: 57 mL/min/{1.73_m2} — ABNORMAL LOW (ref >59)
GFR calc non Af Amer: 49 mL/min/{1.73_m2} — ABNORMAL LOW (ref >59)
GLUCOSE: 152 mg/dL — AB (ref 65–99)
POTASSIUM: 4.3 mmol/L (ref 3.5–5.2)
Sodium: 136 mmol/L (ref 134–144)

## 2018-03-24 LAB — PRO B NATRIURETIC PEPTIDE: NT-PRO BNP: 1849 pg/mL — AB (ref 0–486)

## 2018-03-24 MED ORDER — FUROSEMIDE 80 MG PO TABS: 120.0000 mg | ORAL_TABLET | Freq: Two times a day (BID) | ORAL | 3 refills | Status: DC

## 2018-03-24 NOTE — Patient Instructions (Addendum)
Medication Instructions:  Your physician has recommended you make the following change in your medication:  1. Pt is take two tablets (80 mg ) today @ 4 pm. 2. Pt is to increase lasix to three tablets (120 mg ) in the am and one tablet (40 mg ) @ 4 pm, starting tomorrow.    Labwork: Your physician recommends that you have STAT  lab work today: bmet/pro bnp   Testing/Procedures: -None  Follow-Up: Your physician recommends that you keep your scheduled  follow-up appointment with Dr. Tamala Julian in August.    Any Other Special Instructions Will Be Listed Below (If Applicable).   Patient is having an  electived admmison to the hospital on Monday, am, July 22. Hospital will call patient in the am.  If you need a refill on your cardiac medications before your next appointment, please call your pharmacy.

## 2018-03-27 ENCOUNTER — Other Ambulatory Visit: Payer: Self-pay

## 2018-03-27 ENCOUNTER — Encounter (HOSPITAL_COMMUNITY): Payer: Self-pay | Admitting: General Practice

## 2018-03-27 ENCOUNTER — Inpatient Hospital Stay (HOSPITAL_COMMUNITY)
Admission: RE | Admit: 2018-03-27 | Discharge: 2018-04-04 | DRG: 291 | Disposition: A | Discharge status: Home / Self Care | Payer: Medicare Other | Attending: Cardiology | Admitting: Cardiology

## 2018-03-27 DIAGNOSIS — Z825 Family history of asthma and other chronic lower respiratory diseases: Secondary | ICD-10-CM

## 2018-03-27 DIAGNOSIS — Z79899 Other long term (current) drug therapy: Secondary | ICD-10-CM

## 2018-03-27 DIAGNOSIS — I5043 Acute on chronic combined systolic (congestive) and diastolic (congestive) heart failure: Secondary | ICD-10-CM | POA: Diagnosis present

## 2018-03-27 DIAGNOSIS — I5042 Chronic combined systolic (congestive) and diastolic (congestive) heart failure: Secondary | ICD-10-CM | POA: Diagnosis present

## 2018-03-27 DIAGNOSIS — I493 Ventricular premature depolarization: Secondary | ICD-10-CM | POA: Diagnosis not present

## 2018-03-27 DIAGNOSIS — Z9841 Cataract extraction status, right eye: Secondary | ICD-10-CM

## 2018-03-27 DIAGNOSIS — Z961 Presence of intraocular lens: Secondary | ICD-10-CM | POA: Diagnosis present

## 2018-03-27 DIAGNOSIS — I2510 Atherosclerotic heart disease of native coronary artery without angina pectoris: Secondary | ICD-10-CM | POA: Diagnosis present

## 2018-03-27 DIAGNOSIS — I480 Paroxysmal atrial fibrillation: Secondary | ICD-10-CM | POA: Diagnosis not present

## 2018-03-27 DIAGNOSIS — G4733 Obstructive sleep apnea (adult) (pediatric): Secondary | ICD-10-CM | POA: Diagnosis present

## 2018-03-27 DIAGNOSIS — I4510 Unspecified right bundle-branch block: Secondary | ICD-10-CM | POA: Diagnosis present

## 2018-03-27 DIAGNOSIS — Z96651 Presence of right artificial knee joint: Secondary | ICD-10-CM | POA: Diagnosis present

## 2018-03-27 DIAGNOSIS — N179 Acute kidney failure, unspecified: Secondary | ICD-10-CM | POA: Diagnosis present

## 2018-03-27 DIAGNOSIS — Z7901 Long term (current) use of anticoagulants: Secondary | ICD-10-CM | POA: Diagnosis not present

## 2018-03-27 DIAGNOSIS — Z8619 Personal history of other infectious and parasitic diseases: Secondary | ICD-10-CM

## 2018-03-27 DIAGNOSIS — I071 Rheumatic tricuspid insufficiency: Secondary | ICD-10-CM | POA: Diagnosis present

## 2018-03-27 DIAGNOSIS — N183 Chronic kidney disease, stage 3 (moderate): Secondary | ICD-10-CM | POA: Diagnosis present

## 2018-03-27 DIAGNOSIS — Z9842 Cataract extraction status, left eye: Secondary | ICD-10-CM | POA: Diagnosis not present

## 2018-03-27 DIAGNOSIS — Z9049 Acquired absence of other specified parts of digestive tract: Secondary | ICD-10-CM

## 2018-03-27 DIAGNOSIS — Z8249 Family history of ischemic heart disease and other diseases of the circulatory system: Secondary | ICD-10-CM

## 2018-03-27 DIAGNOSIS — R0602 Shortness of breath: Secondary | ICD-10-CM

## 2018-03-27 DIAGNOSIS — I481 Persistent atrial fibrillation: Secondary | ICD-10-CM | POA: Diagnosis present

## 2018-03-27 DIAGNOSIS — Z5320 Procedure and treatment not carried out because of patient's decision for unspecified reasons: Secondary | ICD-10-CM | POA: Diagnosis not present

## 2018-03-27 DIAGNOSIS — I130 Hypertensive heart and chronic kidney disease with heart failure and stage 1 through stage 4 chronic kidney disease, or unspecified chronic kidney disease: Secondary | ICD-10-CM | POA: Diagnosis present

## 2018-03-27 DIAGNOSIS — Z952 Presence of prosthetic heart valve: Secondary | ICD-10-CM | POA: Diagnosis not present

## 2018-03-27 DIAGNOSIS — I440 Atrioventricular block, first degree: Secondary | ICD-10-CM | POA: Diagnosis not present

## 2018-03-27 DIAGNOSIS — Q211 Atrial septal defect: Secondary | ICD-10-CM | POA: Diagnosis not present

## 2018-03-27 DIAGNOSIS — E1122 Type 2 diabetes mellitus with diabetic chronic kidney disease: Secondary | ICD-10-CM | POA: Diagnosis present

## 2018-03-27 DIAGNOSIS — Z794 Long term (current) use of insulin: Secondary | ICD-10-CM

## 2018-03-27 DIAGNOSIS — Z885 Allergy status to narcotic agent status: Secondary | ICD-10-CM

## 2018-03-27 DIAGNOSIS — Z882 Allergy status to sulfonamides status: Secondary | ICD-10-CM

## 2018-03-27 DIAGNOSIS — E785 Hyperlipidemia, unspecified: Secondary | ICD-10-CM | POA: Diagnosis present

## 2018-03-27 LAB — CBC WITH DIFFERENTIAL/PLATELET
ABS IMMATURE GRANULOCYTES: 0 10*3/uL (ref 0.0–0.1)
Basophils Absolute: 0.1 10*3/uL (ref 0.0–0.1)
Basophils Relative: 1 %
EOS ABS: 0.2 10*3/uL (ref 0.0–0.7)
Eosinophils Relative: 2 %
HEMATOCRIT: 31.5 % — AB (ref 39.0–52.0)
HEMOGLOBIN: 9.9 g/dL — AB (ref 13.0–17.0)
IMMATURE GRANULOCYTES: 0 %
LYMPHS ABS: 1.8 10*3/uL (ref 0.7–4.0)
LYMPHS PCT: 23 %
MCH: 28 pg (ref 26.0–34.0)
MCHC: 31.4 g/dL (ref 30.0–36.0)
MCV: 89.2 fL (ref 78.0–100.0)
Monocytes Absolute: 0.9 10*3/uL (ref 0.1–1.0)
Monocytes Relative: 11 %
NEUTROS ABS: 4.8 10*3/uL (ref 1.7–7.7)
NEUTROS PCT: 63 %
Platelets: 153 10*3/uL (ref 150–400)
RBC: 3.53 MIL/uL — AB (ref 4.22–5.81)
RDW: 16.1 % — AB (ref 11.5–15.5)
WBC: 7.7 10*3/uL (ref 4.0–10.5)

## 2018-03-27 LAB — GLUCOSE, CAPILLARY
GLUCOSE-CAPILLARY: 127 mg/dL — AB (ref 70–99)
GLUCOSE-CAPILLARY: 160 mg/dL — AB (ref 70–99)
Glucose-Capillary: 180 mg/dL — ABNORMAL HIGH (ref 70–99)

## 2018-03-27 LAB — COMPREHENSIVE METABOLIC PANEL
ALBUMIN: 3.6 g/dL (ref 3.5–5.0)
ALT: 15 U/L (ref 0–44)
ANION GAP: 8 (ref 5–15)
AST: 21 U/L (ref 15–41)
Alkaline Phosphatase: 65 U/L (ref 38–126)
BILIRUBIN TOTAL: 0.9 mg/dL (ref 0.3–1.2)
BUN: 35 mg/dL — ABNORMAL HIGH (ref 8–23)
CO2: 27 mmol/L (ref 22–32)
Calcium: 8.9 mg/dL (ref 8.9–10.3)
Chloride: 105 mmol/L (ref 98–111)
Creatinine, Ser: 1.4 mg/dL — ABNORMAL HIGH (ref 0.61–1.24)
GFR calc non Af Amer: 45 mL/min — ABNORMAL LOW (ref >60)
GFR, EST AFRICAN AMERICAN: 52 mL/min — AB (ref >60)
GLUCOSE: 149 mg/dL — AB (ref 70–99)
POTASSIUM: 4.2 mmol/L (ref 3.5–5.1)
Sodium: 140 mmol/L (ref 135–145)
TOTAL PROTEIN: 6.5 g/dL (ref 6.5–8.1)

## 2018-03-27 LAB — MAGNESIUM: MAGNESIUM: 2.2 mg/dL (ref 1.7–2.4)

## 2018-03-27 LAB — TSH: TSH: 0.862 u[IU]/mL (ref 0.350–4.500)

## 2018-03-27 MED ORDER — PROSIGHT PO TABS
ORAL_TABLET | Freq: Two times a day (BID) | ORAL | Status: DC
  Administered 2018-03-27 – 2018-03-28 (×3): 1 via ORAL
  Administered 2018-03-29: 09:00:00 via ORAL
  Administered 2018-03-29 – 2018-04-04 (×12): 1 via ORAL
  Filled 2018-03-27 (×18): qty 1

## 2018-03-27 MED ORDER — OMEGA-3-ACID ETHYL ESTERS 1 G PO CAPS
1.0000 g | ORAL_CAPSULE | Freq: Every day | ORAL | Status: DC
  Administered 2018-03-28 – 2018-04-03 (×7): 1 g via ORAL
  Filled 2018-03-27 (×8): qty 1

## 2018-03-27 MED ORDER — INSULIN ASPART 100 UNIT/ML ~~LOC~~ SOLN
0.0000 [IU] | Freq: Every day | SUBCUTANEOUS | Status: DC
  Administered 2018-03-28: 2 [IU] via SUBCUTANEOUS
  Administered 2018-03-29: 3 [IU] via SUBCUTANEOUS
  Administered 2018-03-30: 2 [IU] via SUBCUTANEOUS
  Administered 2018-03-31: 3 [IU] via SUBCUTANEOUS
  Administered 2018-04-01 – 2018-04-03 (×3): 2 [IU] via SUBCUTANEOUS

## 2018-03-27 MED ORDER — FERROUS SULFATE 325 (65 FE) MG PO TABS
325.0000 mg | ORAL_TABLET | Freq: Two times a day (BID) | ORAL | Status: DC
  Administered 2018-03-27: 325 mg via ORAL
  Filled 2018-03-27: qty 1

## 2018-03-27 MED ORDER — SODIUM CHLORIDE 0.9% FLUSH
3.0000 mL | Freq: Two times a day (BID) | INTRAVENOUS | Status: DC
  Administered 2018-03-27 – 2018-04-02 (×10): 3 mL via INTRAVENOUS

## 2018-03-27 MED ORDER — PRAVASTATIN SODIUM 40 MG PO TABS
80.0000 mg | ORAL_TABLET | Freq: Every evening | ORAL | Status: DC
  Administered 2018-03-27 – 2018-04-03 (×8): 80 mg via ORAL
  Filled 2018-03-27 (×2): qty 2
  Filled 2018-03-27: qty 4
  Filled 2018-03-27 (×5): qty 2

## 2018-03-27 MED ORDER — PANTOPRAZOLE SODIUM 40 MG PO TBEC
40.0000 mg | DELAYED_RELEASE_TABLET | Freq: Two times a day (BID) | ORAL | Status: DC
  Administered 2018-03-27 – 2018-04-04 (×16): 40 mg via ORAL
  Filled 2018-03-27 (×16): qty 1

## 2018-03-27 MED ORDER — FUROSEMIDE 10 MG/ML IJ SOLN
80.0000 mg | Freq: Two times a day (BID) | INTRAMUSCULAR | Status: DC
  Administered 2018-03-27: 80 mg via INTRAVENOUS
  Filled 2018-03-27: qty 8

## 2018-03-27 MED ORDER — FUROSEMIDE 10 MG/ML IJ SOLN
80.0000 mg | Freq: Two times a day (BID) | INTRAMUSCULAR | Status: DC
  Administered 2018-03-27 – 2018-04-04 (×16): 80 mg via INTRAVENOUS
  Filled 2018-03-27 (×16): qty 8

## 2018-03-27 MED ORDER — INSULIN NPH (HUMAN) (ISOPHANE) 100 UNIT/ML ~~LOC~~ SUSP
22.0000 [IU] | Freq: Every day | SUBCUTANEOUS | Status: DC
  Administered 2018-03-27 – 2018-04-03 (×8): 22 [IU] via SUBCUTANEOUS
  Filled 2018-03-27: qty 10

## 2018-03-27 MED ORDER — IRBESARTAN 75 MG PO TABS
37.5000 mg | ORAL_TABLET | Freq: Every day | ORAL | Status: DC
  Administered 2018-03-28 – 2018-04-04 (×8): 37.5 mg via ORAL
  Filled 2018-03-27 (×9): qty 0.5

## 2018-03-27 MED ORDER — INSULIN ASPART 100 UNIT/ML ~~LOC~~ SOLN
0.0000 [IU] | Freq: Three times a day (TID) | SUBCUTANEOUS | Status: DC
  Administered 2018-03-27: 2 [IU] via SUBCUTANEOUS

## 2018-03-27 MED ORDER — ADULT MULTIVITAMIN W/MINERALS CH
1.0000 | ORAL_TABLET | Freq: Every day | ORAL | Status: DC
  Administered 2018-03-28 – 2018-04-04 (×8): 1 via ORAL
  Filled 2018-03-27 (×8): qty 1

## 2018-03-27 MED ORDER — APIXABAN 5 MG PO TABS
5.0000 mg | ORAL_TABLET | Freq: Two times a day (BID) | ORAL | Status: DC
  Administered 2018-03-27 – 2018-04-04 (×16): 5 mg via ORAL
  Filled 2018-03-27 (×16): qty 1

## 2018-03-27 MED ORDER — VITAMIN C 500 MG PO TABS
500.0000 mg | ORAL_TABLET | Freq: Every day | ORAL | Status: DC
  Administered 2018-03-27 – 2018-04-04 (×9): 500 mg via ORAL
  Filled 2018-03-27 (×9): qty 1

## 2018-03-27 MED ORDER — ACETAMINOPHEN 325 MG PO TABS: 650.0000 mg | ORAL_TABLET | ORAL | Status: DC | PRN

## 2018-03-27 MED ORDER — INSULIN LISPRO 100 UNIT/ML ~~LOC~~ SOLN: 8.0000 [IU] | Freq: Every day | SUBCUTANEOUS | Status: DC

## 2018-03-27 MED ORDER — ONDANSETRON HCL 4 MG/2ML IJ SOLN: 4.0000 mg | Freq: Four times a day (QID) | INTRAMUSCULAR | Status: DC | PRN

## 2018-03-27 MED ORDER — TAMSULOSIN HCL 0.4 MG PO CAPS
0.4000 mg | ORAL_CAPSULE | Freq: Every day | ORAL | Status: DC
  Administered 2018-03-28 – 2018-04-03 (×7): 0.4 mg via ORAL
  Filled 2018-03-27 (×8): qty 1

## 2018-03-27 MED ORDER — AMLODIPINE BESYLATE 2.5 MG PO TABS
2.5000 mg | ORAL_TABLET | Freq: Every day | ORAL | Status: DC
  Administered 2018-03-28 – 2018-03-30 (×3): 2.5 mg via ORAL
  Filled 2018-03-27 (×3): qty 1

## 2018-03-27 NOTE — Progress Notes (Signed)
Pt refuse NIV QHS for the night. no distress or complications noted.

## 2018-03-27 NOTE — Progress Notes (Signed)
Pt scheduled for avapro  Pt states he does not take at home  [facility] pharmacy representative, she stated it is a replacement for benacar  Pt states he took benacar this morning and it is once daily

## 2018-03-27 NOTE — Progress Notes (Signed)
Pt has arrived on unit. RN paged MD for orders.

## 2018-03-27 NOTE — Progress Notes (Signed)
Called pharmacy, awaiting medications to be verified

## 2018-03-27 NOTE — H&P (Addendum)
Cardiology Admission History and Physical:   Patient ID: Todd Romero; MRN: [id number]; DOB: 1934/01/04   Admission date: 03/27/2018  Primary Care Provider: [name], MD Primary Cardiologist: Sinclair Grooms, MD  Primary Electrophysiologist:  NA  Chief Complaint:  Edema, SOB  Patient Profile:   Todd Romero is a 84 y.o. male with a history of AS s/p TAVR in July 8242, chronic diastolic HF (HFpEF), CAD w/ LHC 12-2016 as below, DM2, HTN, dyslipidemia, OSA and a fib new  and was seen in the office 03/24/18 for increased SOB, significant edema- and orthopnea.  On the 19th pt preferred to be admitted 03/27/18 with plans to diuresis and then arrange TEE DCCV.  We believe edema/ HF will return until pt back in SR.    History of Present Illness:   Mr. Todd Romero with hx as above, last seen in office 03/24/18 after pt called in with increasing edema despite higher dose of po lasix.  He has been in a fib  Since 03/04/18 at least with hospitalization and RVR but also HF.  He was diuresed and HR was controlled.   He is on Eliquis and has not missed any doses.    He does have a hx of GI bleed in 2018 but now no bleeding on Eliquis.      Last Echo 03/05/18 mod LVH, EF 43% which is lower than previous.  Diffuse hypokinesis.  AV s/p TAVR with no obstruction and trivial paravalvular leak.  LA mildly dilated, RV mildly dilated.  PA pk pressure 47 mmHg.  "Compared to a prior study in 04/2017, the LVEF is lower at 43% with global hypokinesis. The TAVR valve is non-obstructed with trivial perivalvular leak and has a mean gradient of 10 mmHg. A small atrial septal defect is noted. There is moderate TR with an RVSP of 47 mmHg and a dilated IVC"  Over the weekend we increased pt's lasix to 120 mg in AM from 80 and then 40 in pm.  Pro BNP on Friday 1849,  BUN 31, Cr 1.33, K+ 4.3.    Today he is slightly better on higher dose of lasix.  No chest pain, no diarrhea.  Legs have no decreased in  edema.     Past Medical History:  Diagnosis Date  . Anemia   . Arthritis   . CAD (coronary artery disease)    a. cardiac cath 12/2016 showing moderate AS, elevated LVEDP, heavy 3V coronary calcification, 100% mD2, 30-50% LAD, 50-90% stenosis of Cx proximal to origin of L-PDA, RCA not engaged due to poor catheter control (difficult procedure) - coronary status was essentially unchanged from prior.  . Chronic diastolic CHF (congestive heart failure) ([place])   . Degenerative arthritis   . Diabetes ([place])   . Dyspnea   . Essential hypertension   . Hyperlipidemia   . Obesity (BMI 30-39.9) 06/18/2015  . OSA (obstructive sleep apnea)    Severe with AHI 27/hr now on CPAP  no cpap  . Peritonitis ([place]) 1985?  [name] Pneumonia    "6 months - 84 years old"  . Pure hypercholesterolemia   . RBBB   . [facility] spotted fever   . 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 Severe aortic stenosis    a. s/p TAVR 03/2017.  . Type II or unspecified type diabetes mellitus without mention of complication, not stated as uncontrolled  Past Surgical History:  Procedure Laterality Date  . APPENDECTOMY    . CARPAL TUNNEL RELEASE    . COLECTOMY     partial  . COLONOSCOPY WITH PROPOFOL N/A 11/18/2017   Procedure: COLONOSCOPY WITH PROPOFOL;  Surgeon: Carol Ada, MD;  Location: [facility];  Service: Endoscopy;  Laterality: N/A;  . ESOPHAGOGASTRODUODENOSCOPY N/A 11/18/2017   Procedure: ESOPHAGOGASTRODUODENOSCOPY (EGD);  Surgeon: Carol Ada, MD;  Location: [facility];  Service: Endoscopy;  Laterality: N/A;  . EYE SURGERY Bilateral    cataracts removed, lens placed  . JOINT REPLACEMENT    . KNEE CARTILAGE SURGERY    . PARTIAL COLECTOMY    . RIGHT/LEFT HEART CATH AND CORONARY ANGIOGRAPHY N/A 12/28/2016   Procedure: Right/Left Heart Cath and Coronary Angiography;  Surgeon: Belva Crome, MD;  Location: [facility];  Service: Cardiovascular;  Laterality: N/A;  . TEE WITHOUT CARDIOVERSION N/A 03/15/2017   Procedure: TRANSESOPHAGEAL ECHOCARDIOGRAM (TEE);  Surgeon: Burnell Blanks, MD;  Location: [facility];  Service: Open Heart Surgery;  Laterality: N/A;  . TOTAL KNEE ARTHROPLASTY Right 11/08/2017  . TOTAL KNEE ARTHROPLASTY Right 11/08/2017   Procedure: TOTAL KNEE ARTHROPLASTY;  Surgeon: Melrose Nakayama, MD;  Location: [place] [facility];  Service: Orthopedics;  Laterality: Right;  . TRANSCATHETER AORTIC VALVE REPLACEMENT, TRANSFEMORAL N/A 03/15/2017   Procedure: TRANSCATHETER AORTIC VALVE REPLACEMENT, TRANSFEMORAL;  Surgeon: Burnell Blanks, MD;  Location: [facility];  Service: Open Heart Surgery;  Laterality: N/A;  . VEIN SURGERY       Medications Prior to Admission: Prior to Admission medications   Medication Sig Start Date End Date Taking? Authorizing Provider  acetaminophen (TYLENOL) 500 MG tablet Take 500 mg by mouth every 4 (four) hours as needed for moderate pain or headache.    [provider]  amLODipine (NORVASC) 2.5 MG tablet Take 2.5 mg by mouth daily.    [provider]  apixaban (ELIQUIS) 5 MG TABS tablet Take 1 tablet (5 mg total) by mouth 2 (two) times daily. 03/07/18   Cheryln Manly, NP  ferrous sulfate 325 (65 FE) MG tablet Take 325 mg by mouth 2 (two) times daily with a meal.     [provider]  fish oil-omega-3 fatty acids 1000 MG capsule Take 1 g by mouth daily at 12 noon.     [provider]  furosemide (LASIX) 80 MG tablet Take 1.5 tablets (120 mg total) by mouth 2 (two) times daily. Take 3 tablets (120 mg) am and one  Tablet (40 mg ) @ 4 pm. 03/24/18 06/22/18  Isaiah Serge, NP  glimepiride (AMARYL) 2 MG tablet Take 0.5 tablets (1 mg total) by mouth daily. 05/06/17   [name], MD  insulin lispro (HUMALOG) 100 UNIT/ML injection Inject 8 Units into the skin daily. Take with largest meal    [provider]   insulin NPH Human (HUMULIN N,NOVOLIN N) 100 UNIT/ML injection Inject 0.26 mLs (26 Units total) into the skin at bedtime. Uses Vial Patient taking differently: Inject 22-26 Units into the skin at bedtime. Takes 22 units at bedtime but injects 26 units if blood sugar is high 08/19/17   [name], MD  metFORMIN (GLUMETZA) 500 MG (MOD) 24 hr tablet Take 1 tablet (500 mg total) by mouth 2 (two) times daily with a meal. (morning & noon) 05/06/17   [name], MD  Multiple Vitamin (MULTIVITAMIN WITH MINERALS) TABS tablet Take 1 tablet by mouth daily. Mens One a day Vit    [provider]  Multiple Vitamins-Minerals (PRESERVISION AREDS 2 PO) Take 1 capsule by mouth 2 (two) times daily.    [provider]  olmesartan (BENICAR) 40 MG tablet Take 1 tablet (40 mg total) by mouth daily. 12/27/17   [name], MD  pantoprazole (PROTONIX) 40 MG tablet Take 1 tablet (40 mg total) by mouth 2 (two) times daily. 11/19/17   Raiford Noble Latif, DO  pravastatin (PRAVACHOL) 80 MG tablet Take 1 tablet (80 mg total) by mouth every evening. 09/19/17   [name], MD  tamsulosin (FLOMAX) 0.4 MG CAPS capsule Take 1 capsule daily 09/19/17   [name], MD  VICTOZA 18 MG/3ML SOPN INJECT 1.8 MG UNDER THE SKIN AT BEDTIME 03/04/18   [name], MD  vitamin C (ASCORBIC ACID) 500 MG tablet Take 500 mg by mouth daily.    [provider]     Allergies:    Allergies  Allergen Reactions  . Bactrim [Sulfamethoxazole-Trimethoprim] Nausea And Vomiting and Other (See Comments)    Bleeding and ulcers  . Morphine And Related Nausea And Vomiting    Social History:   Social History   Socioeconomic History  . Marital status: Married    Spouse name: Not on file  . Number of children: 4  . Years of education: Not on file  . Highest education level: Not on file  Occupational History  . Occupation: Construction-Retired  Social Needs  . Financial resource strain: Not on file  . Food insecurity:     Worry: Not on file    Inability: Not on file  . Transportation needs:    Medical: Not on file    Non-medical: Not on file  Tobacco Use  . Smoking status: Never Smoker  . Smokeless tobacco: Never Used  Substance and Sexual Activity  . Alcohol use: No  . Drug use: No  . Sexual activity: Not on file  Lifestyle  . Physical activity:    Days per week: Not on file    Minutes per session: Not on file  . Stress: Not on file  Relationships  . Social connections:    Talks on phone: Not on file    Gets together: Not on file    Attends religious service: Not on file    Active member of club or organization: Not on file    Attends meetings of clubs or organizations: Not on file    Relationship status: Not on file  . Intimate partner violence:    Fear of current or ex partner: Not on file    Emotionally abused: Not on file    Physically abused: Not on file    Forced sexual activity: Not on file  Other Topics Concern  . Not on file  Social History Narrative  . Not on file    Family History:   The patient's family history includes Cancer in his sister; Emphysema in his father; Healthy in his sister; Heart failure in his father; Hypertension in his mother.    ROS:  Please see the history of present illness.  General:no colds or fevers, no weight changes Skin:no rashes or ulcers HEENT:no blurred vision, no congestion CV:see HPI PUL:see HPI GI:no diarrhea constipation or melena, no indigestion GU:no hematuria, no dysuria MS:no joint pain, no claudication Neuro:no syncope, no lightheadedness Endo:+ diabetes, no thyroid disease   Physical Exam/Data:   Vitals:   03/27/18 1039  Weight: 246 lb 4.8 oz (111.7 kg)  Height: 5\' 8"  (1.727 m)   No intake or output  data in the 24 hours ending 03/27/18 1047 Filed Weights   03/27/18 1039  Weight: 246 lb 4.8 oz (111.7 kg)   Body mass index is 37.45 kg/m.  General:  Well nourished, well developed, in no acute distress while in  bed HEENT: normal Lymph: no adenopathy Neck: + JVD Endocrine:  No thryomegaly Vascular: No carotid bruits; 2+ radial pulses   Cardiac:  irreg irreg  no murmur gallup rub or click Lungs:  Breath sounds present to auscultation bilaterally, no wheezing, rhonchi + rales in bases Abd: soft, nontender, no hepatomegaly  Ext: 4+ edema of both legs up into thighs and groin. Musculoskeletal:  No deformities, BUE and BLE strength normal and equal Skin: warm and dry  Neuro:  Alert and oriented X 3 MAE follows commands  no focal abnormalities noted Psych:  Normal affect    EKG:  The ECG that was done in the office 03/24/18  was personally reviewed and demonstrates A fib rate controlled RBBB and PVCs no acute changes.   Relevant CV Studies: Echo 03/05/18  Study Conclusions  - Left ventricle: The cavity size was normal. Wall thickness was   increased in a pattern of moderate LVH. The estimated ejection   fraction was 43%. Diffuse hypokinesis. The study is not   technically sufficient to allow evaluation of LV diastolic   function. Ejection fraction (MOD, 2-plane): 43%. - Aortic valve: s/p TAVR valve. No obstruction. Trivial   paravalvular leak. Mean gradient (S): 10 mm Hg. Peak gradient   (S): 24 mm Hg. Valve area (VTI): 1.18 cm^2. Valve area (Vmax):   1.09 cm^2. Valve area (Vmean): 1.2 cm^2. - Left atrium: The atrium was mildly dilated. - Right ventricle: The cavity size was mildly dilated. - Atrial septum: Small atrial septal defect. - Tricuspid valve: There was moderate regurgitation. - Pulmonary arteries: PA peak pressure: 47 mm Hg (S). - Inferior vena cava: The vessel was dilated. The respirophasic   diameter changes were blunted (< 50%), consistent with elevated   central venous pressure.  Impressions:  - Compared to a prior study in 04/2017, the LVEF is lower at 43%   with global hypokinesis. The TAVR valve is non-obstructed with   trivial perivalvular leak and has a mean gradient  of 10 mmHg. A   small atrial septal defect is noted. There is moderate TR with an   RVSP of 47 mmHg and a dilated IVC.   Cardiac cath 4/18  Difficult procedure due to severe angulation in the right innominate/ascending aortic region that produced catheter resistance to torque and advancement.  Moderate aortic stenosis with a transvalvular gradient of 29 mmHg (mean) and 33-38 mmHg (peak to peak). Calculated aortic valve area 1.35 cm.  Elevated left ventricular end-diastolic pressure and pulmonary capillary wedge pressure. Known LV systolic function with EF greater than 50% by recent echo suggesting chronic diastolic left heart failure.  Heavy three-vessel coronary calcification.  100% stenosis in the mid body of the second diagonal. There is diffuse 30-50% narrowing in the LAD.  The circumflex coronary is dominant. Moderate to heavy calcification is noted throughout the distal vessel and in the proximal segment. There is eccentric 50-90% stenosis proximal to the origin of the L-PDA.  Nondominant right coronary. Not selectively engaged due to poor catheter control. The vessel is patent and supplies only the right ventricle.  RECOMMENDATIONS:   Coronary status is essentially unchanged compared to 5 years ago.  Moderate aortic stenosis based upon hemodynamics obtained from this case. This matches  the recent echo.  Explanation for exertional fatigue is uncertain. Consider referral to the valve clinic for a second opinion concerning management of aortic stenosis. May need dobutamine echo as he is potentially a low gradient severe aortic stenosis substrate.  Laboratory Data:  Chemistry Recent Labs  Lab 03/24/18 1100  NA 136  K 4.3  CL 103  CO2 24  GLUCOSE 152*  BUN 31*  CREATININE 1.33*  CALCIUM 8.7  GFRNONAA 49*  GFRAA 57*    No results for input(s): PROT, ALBUMIN, AST, ALT, ALKPHOS, BILITOT in the last 168 hours. HematologyNo results for input(s): WBC, RBC, HGB, HCT,  MCV, MCH, MCHC, RDW, PLT in the last 168 hours. Cardiac EnzymesNo results for input(s): TROPONINI in the last 168 hours. No results for input(s): TROPIPOC in the last 168 hours.  BNP Recent Labs  Lab 03/24/18 1100  PROBNP 1,849*    DDimer No results for input(s): DDIMER in the last 168 hours.  Radiology/Studies:  No results found.  Assessment and Plan:   1. Acute on chronic systolic diastolic HF and atrial fib. - Admit to inpt and diuresis, once diuresed then will need TEE/DCCV.  --pro-bnp elevated.   begin IV lasix 80 mg BID,  Dr. Marlou Porch to see.  2. Atrial fib rate controlled on Eliquis - has not missed any doses.  Plan for TEE DCCV once diuresed.  3.  TAVR in 2018 for severe AS and stable valve  4.  DM-2 insulin dependent will add SSI and continue home insulin  5. HLD continue statin.   6. CAD with last cath 12/28/17 with 3 vessel coronary calcification, but stable and no angina.   7. CKD with elevation on last hospitalization    Severity of Illness: The appropriate patient status for this patient is INPATIENT. Inpatient status is judged to be reasonable and necessary in order to provide the required intensity of service to ensure the patient's safety. The patient's presenting symptoms, physical exam findings, and initial radiographic and laboratory data in the context of their chronic comorbidities is felt to place them at high risk for further clinical deterioration. Furthermore, it is not anticipated that the patient will be medically stable for discharge from the hospital within 2 midnights of admission. The following factors support the patient status of inpatient.   " The patient's presenting symptoms include orthopnea, DOE and significant edema. A fib  " The worrisome physical exam findings include rales in base, edema, a fib. " The initial radiographic and laboratory data are worrisome because of CHF. " The chronic co-morbidities include DM, CAD, S/p TAVR, HLD,    * I certify  that at the point of admission it is my clinical judgment that the patient will require inpatient hospital care spanning beyond 2 midnights from the point of admission due to high intensity of service, high risk for further deterioration and high frequency of surveillance required.*    For questions or updates, please contact [place] Please consult [email or web address] for contact info under Cardiology/STEMI.    Signed, Cecilie Kicks, NP  03/27/2018 10:47 AM   Personally seen and examined. Agree with above.  84 year old male status post TAVR with acute on chronic systolic heart failure, increasing edema, atrial fibrillation, diabetes hypertension.  I saw him as well in clinic on Friday with Cecilie Kicks, NP.  He desired to come into the hospital today.  GEN: Well nourished, well developed, in no acute distress  HEENT: normal  Neck: no JVD, carotid bruits, or masses Cardiac: RRR;  no murmurs, rubs, or gallops, 4+ bilateral lower extremity edema up to groin  Respiratory:  clear to auscultation bilaterally, normal work of breathing GI: soft, nontender, nondistended, + BS MS: no deformity or atrophy  Skin: warm and dry, no rash Neuro:  Alert and Oriented x 3, Strength and sensation are intact Psych: euthymic mood, full affect  Right bundle branch block, atrial fibrillation under good rate control personally reviewed  Echo EF 43% in June 2019. Cardiac catheterization 12/2016- moderate aortic stenosis.  Unchanged CAD  Assessment and plan:  Acute on chronic systolic and diastolic heart failure - IV Lasix 80 mg twice a day, aggressive diuresis.  Monitor creatinine closely.  Persistent atrial fibrillation -Rate controlled, Eliquis for anticoagulation.  No missing of dosing.  Perhaps his atrial fibrillation has contributed to his ongoing heart failure, edema.  Once we are adequately diuresed, pursue  Cardioversion.  TAVR -2018, stable.  Hyperlipidemia -Statin  Coronary artery  disease -Three-vessel coronary artery calcification with cardiac catheterization 12/2017.  No angina.  Candee Furbish, MD

## 2018-03-28 ENCOUNTER — Ambulatory Visit: Payer: Medicare Other | Admitting: Nurse Practitioner

## 2018-03-28 ENCOUNTER — Inpatient Hospital Stay (HOSPITAL_COMMUNITY): Payer: Medicare Other

## 2018-03-28 LAB — BASIC METABOLIC PANEL
Anion gap: 10 (ref 5–15)
BUN: 35 mg/dL — ABNORMAL HIGH (ref 8–23)
CHLORIDE: 102 mmol/L (ref 98–111)
CO2: 27 mmol/L (ref 22–32)
Calcium: 8.8 mg/dL — ABNORMAL LOW (ref 8.9–10.3)
Creatinine, Ser: 1.48 mg/dL — ABNORMAL HIGH (ref 0.61–1.24)
GFR, EST AFRICAN AMERICAN: 49 mL/min — AB (ref >60)
GFR, EST NON AFRICAN AMERICAN: 42 mL/min — AB (ref >60)
Glucose, Bld: 105 mg/dL — ABNORMAL HIGH (ref 70–99)
Potassium: 3.8 mmol/L (ref 3.5–5.1)
SODIUM: 139 mmol/L (ref 135–145)

## 2018-03-28 LAB — GLUCOSE, CAPILLARY
GLUCOSE-CAPILLARY: 110 mg/dL — AB (ref 70–99)
Glucose-Capillary: 191 mg/dL — ABNORMAL HIGH (ref 70–99)
Glucose-Capillary: 202 mg/dL — ABNORMAL HIGH (ref 70–99)
Glucose-Capillary: 217 mg/dL — ABNORMAL HIGH (ref 70–99)

## 2018-03-28 MED ORDER — INSULIN ASPART 100 UNIT/ML ~~LOC~~ SOLN
0.0000 [IU] | Freq: Three times a day (TID) | SUBCUTANEOUS | Status: DC
  Administered 2018-03-28: 3 [IU] via SUBCUTANEOUS
  Administered 2018-03-28 – 2018-04-01 (×8): 2 [IU] via SUBCUTANEOUS
  Administered 2018-04-01: 3 [IU] via SUBCUTANEOUS
  Administered 2018-04-01: 2 [IU] via SUBCUTANEOUS
  Administered 2018-04-02: 3 [IU] via SUBCUTANEOUS
  Administered 2018-04-02: 2 [IU] via SUBCUTANEOUS
  Administered 2018-04-02: 1 [IU] via SUBCUTANEOUS
  Administered 2018-04-03: 3 [IU] via SUBCUTANEOUS
  Administered 2018-04-04: 2 [IU] via SUBCUTANEOUS

## 2018-03-28 MED ORDER — FERROUS SULFATE 325 (65 FE) MG PO TABS
325.0000 mg | ORAL_TABLET | Freq: Two times a day (BID) | ORAL | Status: DC
  Administered 2018-03-28 – 2018-04-04 (×15): 325 mg via ORAL
  Filled 2018-03-28 (×15): qty 1

## 2018-03-28 NOTE — Progress Notes (Signed)
Patient refused CPAP. RT will continue to monitor as needed. 

## 2018-03-28 NOTE — Consult Note (Signed)
   [facility] CM Inpatient Consult   03/28/2018  Todd Romero 11-13-1933 [id number]  Patient screened for readmission within the past 30 days and high risk for unplanned readmission [24%] [place] Management services. Patient is in the [facility] of the [place] Management services under patient's [employer] plan. Met with the patient and his wife at the bedside to explained [facility] Management services.  Patient and wife deny issues with post hospital follow up with primary care provider Dr. [name].  This office is listed to provide the post hospital transition of care follow up. Wife states, "We are hopeful that he will get back into rhythm after cardioversion and we won't have issues like this."  Explained tele-monitoring available through [place]. He states he has no trouble weighing on home scale. Wife describes not liking telemarketing calls but does have a voice mail.   Denies issues with transportation or needs at this time.  Given a brochure, 24 hour nurse advise line magnet with contact information.  For questions contact:   Natividad Brood, RN BSN [facility] Liaison  [phone] business mobile phone Toll free office [phone]

## 2018-03-28 NOTE — Progress Notes (Addendum)
Progress Note  Patient Name: Todd Romero Date of Encounter: 03/28/2018  Primary Cardiologist: Sinclair Grooms, MD   Subjective   Pt is feeling well today, no bleeding problems, respirations unlabored, on room air  Inpatient Medications    Scheduled Meds: . amLODipine  2.5 mg Oral Daily  . apixaban  5 mg Oral BID  . ferrous sulfate  325 mg Oral BID WC  . furosemide  80 mg Intravenous BID  . insulin aspart  0-5 Units Subcutaneous QHS  . insulin aspart  0-9 Units Subcutaneous TID WC  . insulin NPH Human  22 Units Subcutaneous QHS  . irbesartan  37.5 mg Oral Daily  . multivitamin   Oral BID  . multivitamin with minerals  1 tablet Oral Daily  . omega-3 acid ethyl esters  1 g Oral Q1200  . pantoprazole  40 mg Oral BID  . pravastatin  80 mg Oral QPM  . sodium chloride flush  3 mL Intravenous Q12H  . tamsulosin  0.4 mg Oral QPC supper  . vitamin C  500 mg Oral Daily   Continuous Infusions:  PRN Meds: acetaminophen, ondansetron (ZOFRAN) IV   Vital Signs    Vitals:   03/27/18 1950 03/28/18 0017 03/28/18 0521 03/28/18 0754  BP: 118/61 123/65 130/77 132/76  Pulse: 61 (!) 59 64 70  Resp: 18 18 18 16   Temp: 97.7 F (36.5 C) 98.2 F (36.8 C) 97.8 F (36.6 C) 97.9 F (36.6 C)  TempSrc: Oral Oral Oral Oral  SpO2: 99% 94% 99% 98%  Weight:   237 lb 11.2 oz (107.8 kg)   Height:        Intake/Output Summary (Last 24 hours) at 03/28/2018 0929 Last data filed at 03/28/2018 0919 Gross per 24 hour  Intake 1680 ml  Output 4750 ml  Net -3070 ml   Filed Weights   03/27/18 1039 03/28/18 0521  Weight: 246 lb 4.8 oz (111.7 kg) 237 lb 11.2 oz (107.8 kg)    Telemetry    Rate controlled in the 60s - Personally Reviewed  ECG    No new tracings - Personally Reviewed  Physical Exam   GEN: No acute distress.   Neck: No JVD Cardiac: regular rate, mild murmur Respiratory: Clear to auscultation bilaterally. GI: Soft, nontender, non-distended  MS: 1+ nonpitting edema  B LE; No deformity. Neuro:  Nonfocal  Psych: Normal affect   Labs    Chemistry Recent Labs  Lab 03/24/18 1100 03/27/18 1103 03/28/18 0653  NA 136 140 139  K 4.3 4.2 3.8  CL 103 105 102  CO2 24 27 27   GLUCOSE 152* 149* 105*  BUN 31* 35* 35*  CREATININE 1.33* 1.40* 1.48*  CALCIUM 8.7 8.9 8.8*  PROT  --  6.5  --   ALBUMIN  --  3.6  --   AST  --  21  --   ALT  --  15  --   ALKPHOS  --  65  --   BILITOT  --  0.9  --   GFRNONAA 49* 45* 42*  GFRAA 57* 52* 49*  ANIONGAP  --  8 10     Hematology Recent Labs  Lab 03/27/18 1103  WBC 7.7  RBC 3.53*  HGB 9.9*  HCT 31.5*  MCV 89.2  MCH 28.0  MCHC 31.4  RDW 16.1*  PLT 153    Cardiac EnzymesNo results for input(s): TROPONINI in the last 168 hours. No results for input(s): TROPIPOC in the last  168 hours.   BNP Recent Labs  Lab 03/24/18 1100  PROBNP 1,849*     DDimer No results for input(s): DDIMER in the last 168 hours.   Radiology    No results found.  Cardiac Studies   Echo 03/05/18  Study Conclusions - Left ventricle: The cavity size was normal. Wall thickness was increased in a pattern of moderate LVH. The estimated ejection fraction was 43%. Diffuse hypokinesis. The study is not technically sufficient to allow evaluation of LV diastolic function. Ejection fraction (MOD, 2-plane): 43%. - Aortic valve: s/p TAVR valve. No obstruction. Trivial paravalvular leak. Mean gradient (S): 10 mm Hg. Peak gradient (S): 24 mm Hg. Valve area (VTI): 1.18 cm^2. Valve area (Vmax): 1.09 cm^2. Valve area (Vmean): 1.2 cm^2. - Left atrium: The atrium was mildly dilated. - Right ventricle: The cavity size was mildly dilated. - Atrial septum: Small atrial septal defect. - Tricuspid valve: There was moderate regurgitation. - Pulmonary arteries: PA peak pressure: 47 mm Hg (S). - Inferior vena cava: The vessel was dilated. The respirophasic diameter changes were blunted (<50%), consistent with  elevated central venous pressure.  Impressions: - Compared to a prior study in 04/2017, the LVEF is lower at 43% with global hypokinesis. The TAVR valve is non-obstructed with trivial perivalvular leak and has a mean gradient of 10 mmHg. A small atrial septal defect is noted. There is moderate TR with an RVSP of 47 mmHg and a dilated IVC.   Cardiac cath 4/18  Difficult procedure due to severe angulation in the right innominate/ascending aortic region that produced catheter resistance to torque and advancement.  Moderate aortic stenosis with a transvalvular gradient of 29 mmHg (mean) and 33-38 mmHg (peak to peak). Calculated aortic valve area 1.35 cm.  Elevated left ventricular end-diastolic pressure and pulmonary capillary wedge pressure. Known LV systolic function with EF greater than 50% by recent echo suggesting chronic diastolic left heart failure.  Heavy three-vessel coronary calcification.  100% stenosis in the mid body of the second diagonal. There is diffuse 30-50% narrowing in the LAD.  The circumflex coronary is dominant. Moderate to heavy calcification is noted throughout the distal vessel and in the proximal segment. There is eccentric 50-90% stenosis proximal to the origin of the L-PDA.  Nondominant right coronary. Not selectively engaged due to poor catheter control. The vessel is patent and supplies only the right ventricle.  RECOMMENDATIONS:   Coronary status is essentially unchanged compared to 5 years ago.  Moderate aortic stenosis based upon hemodynamics obtained from this case. This matches the recent echo.  Explanation for exertional fatigue is uncertain. Consider referral to the valve clinic for a second opinion concerning management of aortic stenosis. May need dobutamine echo as he is potentially a low gradient severe aortic stenosis substrate.   Patient Profile     84 y.o. male   Assessment & Plan    1. Acute on chronic systolic and  diastolic heart failure - patient is diuresing on 80 mg IV lasix BID - overall net negative 3.3 L with 4.7 L urine output yesterday - weight is 237 lbs from 246 lbs on admission - creatinine 1.48, K stable   2. Atrial fibrillation persistent - on eliquis - no bleeding problems - HR in the 60s - plan for TEE/DCCV once euvolemic   3. HTN - pressures have been controlled - on avapro, norvasc   4. AKI, possible underlying CKD - creatinine 1.48 - baseline appears to be 1.23-1.52 - K is 3.8  5. AS s/p TAVR - stable on echo   For questions or updates, please contact [place] Please consult [email or web address] for contact info under Cardiology/STEMI.      Signed, Tami Lin Duke, PA  03/28/2018, 9:29 AM    Feels better, less SOB, no CP GEN: Well nourished, well developed, in no acute distress  HEENT: normal  Neck: no JVD, carotid bruits, or masses Cardiac: RRR; Soft SM, no rubs, or gallops, 4+ edema  Respiratory:  clear to auscultation bilaterally, normal work of breathing GI: soft, nontender, nondistended, + BS MS: no deformity or atrophy  Skin: warm and dry, no rash Neuro:  Alert and Oriented x 3, Strength and sensation are intact Psych: euthymic mood, full affect  Labs reviewed  A/P:  Acute on chronic systolic HF  - IV lasix  - watch BMET  - Good uop yesterday  - continue diuresis.   Persistent AFIB  - anticoagulation. Rate controlled TEE CV once stable from fluid perspective  Morbid obesity  - weight loss  Candee Furbish, MD

## 2018-03-29 LAB — BASIC METABOLIC PANEL
ANION GAP: 8 (ref 5–15)
BUN: 34 mg/dL — ABNORMAL HIGH (ref 8–23)
CO2: 30 mmol/L (ref 22–32)
Calcium: 9.2 mg/dL (ref 8.9–10.3)
Chloride: 102 mmol/L (ref 98–111)
Creatinine, Ser: 1.56 mg/dL — ABNORMAL HIGH (ref 0.61–1.24)
GFR, EST AFRICAN AMERICAN: 46 mL/min — AB (ref >60)
GFR, EST NON AFRICAN AMERICAN: 40 mL/min — AB (ref >60)
Glucose, Bld: 124 mg/dL — ABNORMAL HIGH (ref 70–99)
Potassium: 4 mmol/L (ref 3.5–5.1)
Sodium: 140 mmol/L (ref 135–145)

## 2018-03-29 LAB — GLUCOSE, CAPILLARY
GLUCOSE-CAPILLARY: 193 mg/dL — AB (ref 70–99)
Glucose-Capillary: 116 mg/dL — ABNORMAL HIGH (ref 70–99)
Glucose-Capillary: 189 mg/dL — ABNORMAL HIGH (ref 70–99)
Glucose-Capillary: 253 mg/dL — ABNORMAL HIGH (ref 70–99)

## 2018-03-29 NOTE — Progress Notes (Signed)
Pt is stable, vitals stable, denies pain and discomfort, ambulated in a hallway, will continue to monitor the patient  Palma Holter, RN

## 2018-03-29 NOTE — Plan of Care (Signed)
  Problem: Clinical Measurements: Goal: Ability to maintain clinical measurements within normal limits will improve Outcome: Progressing Goal: Will remain free from infection Outcome: Progressing   Problem: Nutrition: Goal: Adequate nutrition will be maintained Outcome: Progressing   Problem: Pain Managment: Goal: General experience of comfort will improve Outcome: Progressing   Problem: Safety: Goal: Ability to remain free from injury will improve Outcome: Progressing   Problem: Skin Integrity: Goal: Risk for impaired skin integrity will decrease Outcome: Progressing

## 2018-03-29 NOTE — Progress Notes (Signed)
Patient refused bed alarm. Low fall risk, but will continue to monitor patient.

## 2018-03-29 NOTE — Progress Notes (Signed)
Pt refusing CPAP for the night. RT will continue to monitor as needed.  

## 2018-03-29 NOTE — Progress Notes (Signed)
Progress Note  Patient Name: Todd Romero Date of Encounter: 03/29/2018  Primary Cardiologist: Sinclair Grooms, MD   Subjective   Feeling a little bit better less short of breath, no chest pain, EDEMA  Inpatient Medications    Scheduled Meds: . amLODipine  2.5 mg Oral Daily  . apixaban  5 mg Oral BID  . ferrous sulfate  325 mg Oral BID WC  . furosemide  80 mg Intravenous BID  . insulin aspart  0-5 Units Subcutaneous QHS  . insulin aspart  0-9 Units Subcutaneous TID WC  . insulin NPH Human  22 Units Subcutaneous QHS  . irbesartan  37.5 mg Oral Daily  . multivitamin   Oral BID  . multivitamin with minerals  1 tablet Oral Daily  . omega-3 acid ethyl esters  1 g Oral Q1200  . pantoprazole  40 mg Oral BID  . pravastatin  80 mg Oral QPM  . sodium chloride flush  3 mL Intravenous Q12H  . tamsulosin  0.4 mg Oral QPC supper  . vitamin C  500 mg Oral Daily   Continuous Infusions:  PRN Meds: acetaminophen, ondansetron (ZOFRAN) IV   Vital Signs    Vitals:   03/28/18 0754 03/28/18 1140 03/28/18 2009 03/29/18 0450  BP: 132/76 128/65 105/68 139/67  Pulse: 70 61 (!) 58 (!) 58  Resp: 16 18 20 20   Temp: 97.9 F (36.6 C) 98.5 F (36.9 C) 98.2 F (36.8 C) (!) 97.5 F (36.4 C)  TempSrc: Oral Oral Oral Oral  SpO2: 98% 98% 97% 97%  Weight:    234 lb (106.1 kg)  Height:        Intake/Output Summary (Last 24 hours) at 03/29/2018 1001 Last data filed at 03/29/2018 0950 Gross per 24 hour  Intake 1200 ml  Output 3700 ml  Net -2500 ml   Filed Weights   03/27/18 1039 03/28/18 0521 03/29/18 0450  Weight: 246 lb 4.8 oz (111.7 kg) 237 lb 11.2 oz (107.8 kg) 234 lb (106.1 kg)    Telemetry    No adverse arrhythmias, AFIB brady- Personally Reviewed  ECG    No new- Personally Reviewed  Physical Exam   GEN: No acute distress.   Neck: No JVD Cardiac: RRR, no murmurs, rubs, or gallops.  Respiratory: Clear to auscultation bilaterally. GI: Soft, nontender, non-distended   MS:  3-4+ lower extremity edema; No deformity. Neuro:  Nonfocal  Psych: Normal affect   Labs    Chemistry Recent Labs  Lab 03/27/18 1103 03/28/18 0653 03/29/18 0437  NA 140 139 140  K 4.2 3.8 4.0  CL 105 102 102  CO2 27 27 30   GLUCOSE 149* 105* 124*  BUN 35* 35* 34*  CREATININE 1.40* 1.48* 1.56*  CALCIUM 8.9 8.8* 9.2  PROT 6.5  --   --   ALBUMIN 3.6  --   --   AST 21  --   --   ALT 15  --   --   ALKPHOS 65  --   --   BILITOT 0.9  --   --   GFRNONAA 45* 42* 40*  GFRAA 52* 49* 46*  ANIONGAP 8 10 8      Hematology Recent Labs  Lab 03/27/18 1103  WBC 7.7  RBC 3.53*  HGB 9.9*  HCT 31.5*  MCV 89.2  MCH 28.0  MCHC 31.4  RDW 16.1*  PLT 153    Cardiac EnzymesNo results for input(s): TROPONINI in the last 168 hours. No results for input(s):  TROPIPOC in the last 168 hours.   BNP Recent Labs  Lab 03/24/18 1100  PROBNP 1,849*     DDimer No results for input(s): DDIMER in the last 168 hours.   Radiology    Dg Chest 2 View  Result Date: 03/28/2018 CLINICAL DATA:  Shortness of breath. History of CHF, coronary artery disease, transcatheter aortic valve replacement. EXAM: CHEST - 2 VIEW COMPARISON:  PA and lateral chest x-ray of March 04, 2018 FINDINGS: The lungs are well-expanded. The small bilateral pleural effusions are less conspicuous today. The interstitial markings are mildly prominent but also slightly improved over the previous study. The cardiac silhouette remains enlarged. There is calcification in the wall of the aortic arch. The prosthetic aortic valve cage is visible. There is multilevel degenerative disc disease of the thoracic spine. IMPRESSION: Mild CHF with small bilateral pleural effusions slightly improved over the previous study. No alveolar pneumonia. Thoracic aortic atherosclerosis. Electronically Signed   By: David  Martinique M.D.   On: 03/28/2018 09:31    Cardiac Studies   EF 43, TAVR valve  Patient Profile     84 y.o. male moderate coronary  artery disease, TAVR, acute on chronic systolic and diastolic heart failure, persistent atrial fibrillation  Assessment & Plan    Acute on chronic systolic and diastolic heart failure -Continue with IV Lasix 80 twice a day.  Creatinine 1.5.  Potassium 4.0.  Weight down to 234 pounds, admit weight 246 pounds.  2.5 L out yesterday  Persistent atrial fibrillation -Eliquis, no bleeding, heart rate in the 60s, plan for TEE cardioversion once euvolemic.  This will help atrial kick.  Essential hypertension - Overall reasonably controlled.  Avapro, Norvasc. Can hold if needed  Acute kidney injury -Creatinine mildly increased to 1.5 with diuresis.  Continue to monitor.  To be expected.  Note, on low-dose Avapro.  Morbid obesity -Continue to encourage weight loss.  For questions or updates, please contact [place] Please consult [email or web address] for contact info under Cardiology/STEMI.      Signed, Candee Furbish, MD  03/29/2018, 10:01 AM

## 2018-03-30 LAB — BASIC METABOLIC PANEL
Anion gap: 8 (ref 5–15)
BUN: 32 mg/dL — ABNORMAL HIGH (ref 8–23)
CHLORIDE: 101 mmol/L (ref 98–111)
CO2: 30 mmol/L (ref 22–32)
CREATININE: 1.34 mg/dL — AB (ref 0.61–1.24)
Calcium: 9.1 mg/dL (ref 8.9–10.3)
GFR calc Af Amer: 55 mL/min — ABNORMAL LOW (ref >60)
GFR, EST NON AFRICAN AMERICAN: 48 mL/min — AB (ref >60)
Glucose, Bld: 120 mg/dL — ABNORMAL HIGH (ref 70–99)
POTASSIUM: 3.9 mmol/L (ref 3.5–5.1)
SODIUM: 139 mmol/L (ref 135–145)

## 2018-03-30 LAB — GLUCOSE, CAPILLARY
GLUCOSE-CAPILLARY: 124 mg/dL — AB (ref 70–99)
GLUCOSE-CAPILLARY: 120 mg/dL — AB (ref 70–99)
GLUCOSE-CAPILLARY: 211 mg/dL — AB (ref 70–99)
Glucose-Capillary: 189 mg/dL — ABNORMAL HIGH (ref 70–99)
Glucose-Capillary: 198 mg/dL — ABNORMAL HIGH (ref 70–99)

## 2018-03-30 NOTE — Plan of Care (Signed)
  Problem: Education: Goal: Ability to demonstrate management of disease process will improve Outcome: Completed/Met   Problem: Education: Goal: Ability to demonstrate management of disease process will improve Outcome: Completed/Met

## 2018-03-30 NOTE — Care Management Important Message (Signed)
Important Message  Patient Details  Name: Todd Romero MRN: [id number] Date of Birth: 28-Oct-1933   Medicare Important Message Given:  Yes    Keana Dueitt P Corah Willeford 03/30/2018, 2:11 PM

## 2018-03-30 NOTE — Progress Notes (Signed)
Pt refusing CPAP for the night. RT will continue to monitor as needed.  

## 2018-03-30 NOTE — Plan of Care (Signed)
  Problem: Nutrition: Goal: Adequate nutrition will be maintained Outcome: Progressing   Problem: Elimination: Goal: Will not experience complications related to bowel motility Outcome: Progressing   Problem: Pain Managment: Goal: General experience of comfort will improve Outcome: Progressing   

## 2018-03-30 NOTE — Progress Notes (Signed)
Progress Note  Patient Name: Todd Romero Hair Date of Encounter: 03/30/2018  Primary Cardiologist: [name], MD   Subjective   Once again feeling slightly better.  Still with quite a bit of edema.  No chest pain. Walking hallway  Inpatient Medications    Scheduled Meds: . amLODipine  2.5 mg Oral Daily  . apixaban  5 mg Oral BID  . ferrous sulfate  325 mg Oral BID WC  . furosemide  80 mg Intravenous BID  . insulin aspart  0-5 Units Subcutaneous QHS  . insulin aspart  0-9 Units Subcutaneous TID WC  . insulin NPH Human  22 Units Subcutaneous QHS  . irbesartan  37.5 mg Oral Daily  . multivitamin   Oral BID  . multivitamin with minerals  1 tablet Oral Daily  . omega-3 acid ethyl esters  1 g Oral Q1200  . pantoprazole  40 mg Oral BID  . pravastatin  80 mg Oral QPM  . sodium chloride flush  3 mL Intravenous Q12H  . tamsulosin  0.4 mg Oral QPC supper  . vitamin C  500 mg Oral Daily   Continuous Infusions:  PRN Meds: acetaminophen, ondansetron (ZOFRAN) IV   Vital Signs    Vitals:   03/29/18 1222 03/29/18 2012 03/30/18 0605 03/30/18 0903  BP: (!) 150/74 (!) 109/54 129/75 (!) 117/54  Pulse: 66 66 62 70  Resp: 20 18 18 16   Temp: 98 F (36.7 C) 98.2 F (36.8 C) 97.7 F (36.5 C)   TempSrc: Oral Oral Oral   SpO2: 99% 99% 97% 95%  Weight:   230 lb 1.6 oz (104.4 kg)   Height:        Intake/Output Summary (Last 24 hours) at 03/30/2018 1019 Last data filed at 03/30/2018 8657 Gross per 24 hour  Intake 1200 ml  Output 4175 ml  Net -2975 ml   Filed Weights   03/28/18 0521 03/29/18 0450 03/30/18 0605  Weight: 237 lb 11.2 oz (107.8 kg) 234 lb (106.1 kg) 230 lb 1.6 oz (104.4 kg)    Telemetry    Atrial fibrillation under rate control- Personally Reviewed  ECG    No new- Personally Reviewed  Physical Exam   GEN: Well nourished, well developed, in no acute distress  HEENT: normal  Neck: no JVD, carotid bruits, or masses Cardiac: Irreg irreg; no murmurs,  rubs, or gallops, 4+ edema  Respiratory:  clear to auscultation bilaterally, normal work of breathing GI: soft, nontender, nondistended, + BS MS: no deformity or atrophy  Skin: warm and dry, no rash Neuro:  Alert and Oriented x 3, Strength and sensation are intact Psych: euthymic mood, full affect   Labs    Chemistry Recent Labs  Lab 03/27/18 1103 03/28/18 0653 03/29/18 0437 03/30/18 0601  NA 140 139 140 139  K 4.2 3.8 4.0 3.9  CL 105 102 102 101  CO2 27 27 30 30   GLUCOSE 149* 105* 124* 120*  BUN 35* 35* 34* 32*  CREATININE 1.40* 1.48* 1.56* 1.34*  CALCIUM 8.9 8.8* 9.2 9.1  PROT 6.5  --   --   --   ALBUMIN 3.6  --   --   --   AST 21  --   --   --   ALT 15  --   --   --   ALKPHOS 65  --   --   --   BILITOT 0.9  --   --   --   GFRNONAA 45* 42*  40* 48*  GFRAA 52* 49* 46* 55*  ANIONGAP 8 10 8 8      Hematology Recent Labs  Lab 03/27/18 1103  WBC 7.7  RBC 3.53*  HGB 9.9*  HCT 31.5*  MCV 89.2  MCH 28.0  MCHC 31.4  RDW 16.1*  PLT 153    Cardiac EnzymesNo results for input(s): TROPONINI in the last 168 hours. No results for input(s): TROPIPOC in the last 168 hours.   BNP Recent Labs  Lab 03/24/18 1100  PROBNP 1,849*     DDimer No results for input(s): DDIMER in the last 168 hours.   Radiology    No results found.  Cardiac Studies   EF 43, TAVR valve  Patient Profile     84 y.o. male moderate coronary artery disease, TAVR, acute on chronic systolic and diastolic heart failure, persistent atrial fibrillation  Assessment & Plan    Acute on chronic systolic and diastolic heart failure -Continue with IV Lasix 80 twice a day.    Weight down to 230 pounds, admit weight 246 pounds.  3.3 L out yesterday  Persistent atrial fibrillation -Eliquis, no bleeding, heart rate in the 60s, plan for TEE cardioversion once euvolemic.  This will help atrial kick.  Essential hypertension - Overall reasonably controlled.  Avapro, Norvasc. Can hold if needed.  In  fact, I will stop his Norvasc at this point.  Acute kidney injury -Creatinine mildly increased to 1.5 back to 1.34 with diuresis.  Continue to monitor.  To be expected.  Note, on low-dose Avapro.  Morbid obesity -Continue to encourage weight loss.  For questions or updates, please contact [place] Please consult [email or web address] for contact info under Cardiology/STEMI.      Signed, Candee Furbish, MD  03/30/2018, 10:19 AM

## 2018-03-31 DIAGNOSIS — Z952 Presence of prosthetic heart valve: Secondary | ICD-10-CM

## 2018-03-31 LAB — BASIC METABOLIC PANEL
Anion gap: 9 (ref 5–15)
BUN: 30 mg/dL — AB (ref 8–23)
CO2: 30 mmol/L (ref 22–32)
CREATININE: 1.4 mg/dL — AB (ref 0.61–1.24)
Calcium: 9.1 mg/dL (ref 8.9–10.3)
Chloride: 100 mmol/L (ref 98–111)
GFR calc Af Amer: 52 mL/min — ABNORMAL LOW (ref >60)
GFR calc non Af Amer: 45 mL/min — ABNORMAL LOW (ref >60)
Glucose, Bld: 98 mg/dL (ref 70–99)
Potassium: 3.8 mmol/L (ref 3.5–5.1)
Sodium: 139 mmol/L (ref 135–145)

## 2018-03-31 LAB — GLUCOSE, CAPILLARY
GLUCOSE-CAPILLARY: 196 mg/dL — AB (ref 70–99)
GLUCOSE-CAPILLARY: 254 mg/dL — AB (ref 70–99)
Glucose-Capillary: 105 mg/dL — ABNORMAL HIGH (ref 70–99)
Glucose-Capillary: 194 mg/dL — ABNORMAL HIGH (ref 70–99)

## 2018-03-31 MED ORDER — SODIUM CHLORIDE 0.9% FLUSH: 3.0000 mL | INTRAVENOUS | Status: DC | PRN

## 2018-03-31 MED ORDER — SODIUM CHLORIDE 0.9% FLUSH
3.0000 mL | Freq: Two times a day (BID) | INTRAVENOUS | Status: DC
  Administered 2018-03-31 – 2018-04-04 (×6): 3 mL via INTRAVENOUS

## 2018-03-31 MED ORDER — SODIUM CHLORIDE 0.9 % IV SOLN
250.0000 mL | INTRAVENOUS | Status: DC
  Administered 2018-04-03: 11:00:00 via INTRAVENOUS

## 2018-03-31 MED ORDER — SODIUM CHLORIDE 0.9 % IV SOLN: INTRAVENOUS | Status: DC

## 2018-03-31 NOTE — Care Management Note (Signed)
Case Management Note  Patient Details  Name: Todd Romero MRN: [id number] Date of Birth: 1933/10/03  Subjective/Objective:   CHF                Action/Plan: Patient lives at home with spouse;  Primary Care Provider: [name], MD; has private insurance with [employer] with prescription drug coverage; pharmacy of choice is [facility]; has scales at home and knows to weigh himself daily. Patient /spouse tend to eat out a lot; patient stated that he will make better food choices. No DME at this time; Very pleasant gentleman with any complaints. CM will continue to follow for progression of care.  Expected Discharge Date:    possibly 04/04/2018              Expected Discharge Plan:   Home/ self care  Status of Service:   In progress  Sherrilyn Rist [phone] 03/31/2018, 10:59 AM

## 2018-03-31 NOTE — Progress Notes (Signed)
Cardioversion scheduled for 04/03/18 at 11:00AM with Dr. Johnsie Cancel

## 2018-03-31 NOTE — Progress Notes (Signed)
Pt refusing to wear cpap for the night. RT will continue to monitor as needed. 

## 2018-03-31 NOTE — Progress Notes (Addendum)
Progress Note  Patient Name: Todd Romero Date of Encounter: 03/31/2018  Primary Cardiologist: Sinclair Grooms, MD   Subjective   No complaints - walking in hall, groin edema is resolving  Inpatient Medications    Scheduled Meds: . apixaban  5 mg Oral BID  . ferrous sulfate  325 mg Oral BID WC  . furosemide  80 mg Intravenous BID  . insulin aspart  0-5 Units Subcutaneous QHS  . insulin aspart  0-9 Units Subcutaneous TID WC  . insulin NPH Human  22 Units Subcutaneous QHS  . irbesartan  37.5 mg Oral Daily  . multivitamin   Oral BID  . multivitamin with minerals  1 tablet Oral Daily  . omega-3 acid ethyl esters  1 g Oral Q1200  . pantoprazole  40 mg Oral BID  . pravastatin  80 mg Oral QPM  . sodium chloride flush  3 mL Intravenous Q12H  . tamsulosin  0.4 mg Oral QPC supper  . vitamin C  500 mg Oral Daily   Continuous Infusions:  PRN Meds: acetaminophen, ondansetron (ZOFRAN) IV   Vital Signs    Vitals:   03/30/18 2014 03/31/18 0152 03/31/18 0439 03/31/18 0833  BP: 120/63  (!) 121/57 (!) 144/94  Pulse: 68  63 71  Resp: 16  18   Temp: 98.3 F (36.8 C)  (!) 97.5 F (36.4 C)   TempSrc: Oral  Oral   SpO2: 98%  93%   Weight:  227 lb 12.8 oz (103.3 kg)    Height:        Intake/Output Summary (Last 24 hours) at 03/31/2018 0851 Last data filed at 03/31/2018 0828 Gross per 24 hour  Intake 600 ml  Output 2550 ml  Net -1950 ml   Filed Weights   03/29/18 0450 03/30/18 0605 03/31/18 0152  Weight: 234 lb (106.1 kg) 230 lb 1.6 oz (104.4 kg) 227 lb 12.8 oz (103.3 kg)    Telemetry   A fib with PVCs - Personally Reviewed  ECG    No new - Personally Reviewed  Physical Exam   GEN: No acute distress.   Neck: MildJVD Cardiac: irreg irreg, no murmurs, rubs, or gallops.  Respiratory: Clear to auscultation bilaterally. GI: Soft, nontender, non-distended  MS: 1-2+ edema lower ext; No deformity. Neuro:  Nonfocal  Psych: Normal affect   Labs     Chemistry Recent Labs  Lab 03/27/18 1103  03/29/18 0437 03/30/18 0601 03/31/18 0545  NA 140   < > 140 139 139  K 4.2   < > 4.0 3.9 3.8  CL 105   < > 102 101 100  CO2 27   < > 30 30 30   GLUCOSE 149*   < > 124* 120* 98  BUN 35*   < > 34* 32* 30*  CREATININE 1.40*   < > 1.56* 1.34* 1.40*  CALCIUM 8.9   < > 9.2 9.1 9.1  PROT 6.5  --   --   --   --   ALBUMIN 3.6  --   --   --   --   AST 21  --   --   --   --   ALT 15  --   --   --   --   ALKPHOS 65  --   --   --   --   BILITOT 0.9  --   --   --   --   GFRNONAA 45*   < > 40*  48* 45*  GFRAA 52*   < > 46* 55* 52*  ANIONGAP 8   < > 8 8 9    < > = values in this interval not displayed.     Hematology Recent Labs  Lab 03/27/18 1103  WBC 7.7  RBC 3.53*  HGB 9.9*  HCT 31.5*  MCV 89.2  MCH 28.0  MCHC 31.4  RDW 16.1*  PLT 153    Cardiac EnzymesNo results for input(s): TROPONINI in the last 168 hours. No results for input(s): TROPIPOC in the last 168 hours.   BNP Recent Labs  Lab 03/24/18 1100  PROBNP 1,849*     DDimer No results for input(s): DDIMER in the last 168 hours.   Radiology    No results found.  Cardiac Studies    Echo 03/05/18  Study Conclusions  - Left ventricle: The cavity size was normal. Wall thickness was increased in a pattern of moderate LVH. The estimated ejection fraction was 43%. Diffuse hypokinesis. The study is not technically sufficient to allow evaluation of LV diastolic function. Ejection fraction (MOD, 2-plane): 43%. - Aortic valve: s/p TAVR valve. No obstruction. Trivial paravalvular leak. Mean gradient (S): 10 mm Hg. Peak gradient (S): 24 mm Hg. Valve area (VTI): 1.18 cm^2. Valve area (Vmax): 1.09 cm^2. Valve area (Vmean): 1.2 cm^2. - Left atrium: The atrium was mildly dilated. - Right ventricle: The cavity size was mildly dilated. - Atrial septum: Small atrial septal defect. - Tricuspid valve: There was moderate regurgitation. - Pulmonary arteries: PA peak  pressure: 47 mm Hg (S). - Inferior vena cava: The vessel was dilated. The respirophasic diameter changes were blunted (<50%), consistent with elevated central venous pressure.  Impressions:  - Compared to a prior study in 04/2017, the LVEF is lower at 43% with global hypokinesis. The TAVR valve is non-obstructed with trivial perivalvular leak and has a mean gradient of 10 mmHg. A small atrial septal defect is noted. There is moderate TR with an RVSP of 47 mmHg and a dilated IVC.   Cardiac cath 4/18  Difficult procedure due to severe angulation in the right innominate/ascending aortic region that produced catheter resistance to torque and advancement.  Moderate aortic stenosis with a transvalvular gradient of 29 mmHg (mean) and 33-38 mmHg (peak to peak). Calculated aortic valve area 1.35 cm.  Elevated left ventricular end-diastolic pressure and pulmonary capillary wedge pressure. Known LV systolic function with EF greater than 50% by recent echo suggesting chronic diastolic left heart failure.  Heavy three-vessel coronary calcification.  100% stenosis in the mid body of the second diagonal. There is diffuse 30-50% narrowing in the LAD.  The circumflex coronary is dominant. Moderate to heavy calcification is noted throughout the distal vessel and in the proximal segment. There is eccentric 50-90% stenosis proximal to the origin of the L-PDA.  Nondominant right coronary. Not selectively engaged due to poor catheter control. The vessel is patent and supplies only the right ventricle.     Patient Profile     84 y.o. male with a history of AS s/p TAVR in July 8676, chronic diastolic HF (HFpEF), CAD w/ LHC 12-2016 as below, DM2, HTN, dyslipidemia, OSA and a fib new  and was seen in the office 03/24/18 for increased SOB, significant edema- and orthopnea.  On the 19th pt preferred to be admitted 03/27/18 with plans to diuresis and then arrange TEE DCCV.  We believe edema/ HF  will return until pt back in SR.     Assessment & Plan  Acute on chronic systolic and diastolic heart failure -Continue with IV Lasix 80 twice a day.    --neg 10,895 since admit and wt down from 246 to 227 (19 lbs) --neg 1950 yesterday --on lasix 80 mg BID  Persistent atrial fibrillation -Eliquis, no bleeding, heart rate in the 60s, plan for TEE cardioversion once euvolemic.  This will help atrial kick. --no missed doses Will defer to Dr. Marlou Porch for possible TEE DCCV Monday  Essential hypertension - Overall reasonably controlled.  Avapro, Norvasc. Can hold if needed.  Norvasc stopped with lower BPs  --BP 120/63 to 144/94.  Acute kidney injury -Creatinine mildly increased to 1.5 back to 1.34 with diuresis.  Today Cr is 1.40 --Continue to monitor.  To be expected.  Note, on low-dose Avapro.  Morbid obesity -Continue to encourage weight loss.  Hx of TAVR stable on last Echo  CAD 3 vessel CAD calcification with last cath 12/2017, no angina   DM-2 glucose controlled 105 to 211. On SSI and lower dose home NPH --amaryl and metformin on hold  For questions or updates, please contact [place] Please consult [email or web address] for contact info under Cardiology/STEMI.      Signed, Cecilie Kicks, NP  03/31/2018, 8:51 AM    Personally seen and examined. Agree with above.  Improving, less SOB, less edema  GEN: Well nourished, well developed, in no acute distress  HEENT: normal  Neck: no JVD, carotid bruits, or masses Cardiac: IRRR; no murmurs, rubs, or gallops, 3-4+ edema  Respiratory:  clear to auscultation bilaterally, normal work of breathing GI: soft, nontender, nondistended, + BS MS: no deformity or atrophy  Skin: warm and dry, no rash Neuro:  Alert and Oriented x 3, Strength and sensation are intact Psych: euthymic mood, full affect  Creat stable  A/P:  Acute on chronic diastolic and systolic HF  - continue IV lasix 80 BID   AFIB, persistent  - DCCV Monday  - has been on Eliquis since 03/07/18 (been 3 weeks). Does not need TEE. Explained to family.   CAD  - stable  TAVR stable.   Candee Furbish, MD

## 2018-03-31 NOTE — Discharge Instructions (Addendum)

## 2018-04-01 DIAGNOSIS — G4733 Obstructive sleep apnea (adult) (pediatric): Secondary | ICD-10-CM

## 2018-04-01 LAB — GLUCOSE, CAPILLARY
Glucose-Capillary: 198 mg/dL — ABNORMAL HIGH (ref 70–99)
Glucose-Capillary: 190 mg/dL — ABNORMAL HIGH (ref 70–99)
Glucose-Capillary: 240 mg/dL — ABNORMAL HIGH (ref 70–99)
Glucose-Capillary: 225 mg/dL — ABNORMAL HIGH (ref 70–99)

## 2018-04-01 LAB — BASIC METABOLIC PANEL
ANION GAP: 10 (ref 5–15)
BUN: 36 mg/dL — ABNORMAL HIGH (ref 8–23)
CO2: 28 mmol/L (ref 22–32)
Calcium: 9.1 mg/dL (ref 8.9–10.3)
Chloride: 97 mmol/L — ABNORMAL LOW (ref 98–111)
Creatinine, Ser: 1.54 mg/dL — ABNORMAL HIGH (ref 0.61–1.24)
GFR calc non Af Amer: 40 mL/min — ABNORMAL LOW (ref >60)
GFR, EST AFRICAN AMERICAN: 47 mL/min — AB (ref >60)
Glucose, Bld: 182 mg/dL — ABNORMAL HIGH (ref 70–99)
POTASSIUM: 4.1 mmol/L (ref 3.5–5.1)
SODIUM: 135 mmol/L (ref 135–145)

## 2018-04-01 MED ORDER — QUETIAPINE FUMARATE 25 MG PO TABS
25.0000 mg | ORAL_TABLET | Freq: Every day | ORAL | Status: DC
  Administered 2018-04-01 – 2018-04-02 (×2): 25 mg via ORAL
  Filled 2018-04-01 (×2): qty 1

## 2018-04-01 NOTE — Progress Notes (Signed)
Pt requesting sleeping pill  Informed cardiology PA  PA [facility] will place orders

## 2018-04-01 NOTE — Progress Notes (Signed)
Progress Note  Patient Name: Todd Romero Date of Encounter: 04/01/2018  Primary Cardiologist: Sinclair Grooms, MD   Subjective   No complaints - walking in hall, groin edema is resolving  Inpatient Medications    Scheduled Meds: . apixaban  5 mg Oral BID  . ferrous sulfate  325 mg Oral BID WC  . furosemide  80 mg Intravenous BID  . insulin aspart  0-5 Units Subcutaneous QHS  . insulin aspart  0-9 Units Subcutaneous TID WC  . insulin NPH Human  22 Units Subcutaneous QHS  . irbesartan  37.5 mg Oral Daily  . multivitamin   Oral BID  . multivitamin with minerals  1 tablet Oral Daily  . omega-3 acid ethyl esters  1 g Oral Q1200  . pantoprazole  40 mg Oral BID  . pravastatin  80 mg Oral QPM  . sodium chloride flush  3 mL Intravenous Q12H  . sodium chloride flush  3 mL Intravenous Q12H  . tamsulosin  0.4 mg Oral QPC supper  . vitamin C  500 mg Oral Daily   Continuous Infusions: . sodium chloride    . [START ON 04/03/2018] sodium chloride     PRN Meds: acetaminophen, ondansetron (ZOFRAN) IV, sodium chloride flush   Vital Signs    Vitals:   03/31/18 2135 04/01/18 0500 04/01/18 0536 04/01/18 0825  BP: 121/63  134/80 134/70  Pulse: 66  65 65  Resp: 16  17   Temp: 98.3 F (36.8 C)  97.6 F (36.4 C)   TempSrc: Oral  Oral   SpO2: 97%  97%   Weight:  226 lb 11.2 oz (102.8 kg)    Height:        Intake/Output Summary (Last 24 hours) at 04/01/2018 1135 Last data filed at 04/01/2018 1038 Gross per 24 hour  Intake 723 ml  Output 2475 ml  Net -1752 ml   Filed Weights   03/30/18 0605 03/31/18 0152 04/01/18 0500  Weight: 230 lb 1.6 oz (104.4 kg) 227 lb 12.8 oz (103.3 kg) 226 lb 11.2 oz (102.8 kg)    Telemetry   A fib with PVCs - Personally Reviewed  ECG    No new - Personally Reviewed  Physical Exam   General appearance: alert and no distress Neck: JVD - 3 cm above sternal notch, no carotid bruit and thyroid not enlarged, symmetric, no  tenderness/mass/nodules Lungs: diminished breath sounds bibasilar Heart: irregularly irregular rhythm Extremities: edema 1-2+ bilateral edema Neurologic: Grossly normal   Labs    Chemistry Recent Labs  Lab 03/27/18 1103  03/30/18 0601 03/31/18 0545 04/01/18 0422  NA 140   < > 139 139 135  K 4.2   < > 3.9 3.8 4.1  CL 105   < > 101 100 97*  CO2 27   < > 30 30 28   GLUCOSE 149*   < > 120* 98 182*  BUN 35*   < > 32* 30* 36*  CREATININE 1.40*   < > 1.34* 1.40* 1.54*  CALCIUM 8.9   < > 9.1 9.1 9.1  PROT 6.5  --   --   --   --   ALBUMIN 3.6  --   --   --   --   AST 21  --   --   --   --   ALT 15  --   --   --   --   ALKPHOS 65  --   --   --   --  BILITOT 0.9  --   --   --   --   GFRNONAA 45*   < > 48* 45* 40*  GFRAA 52*   < > 55* 52* 47*  ANIONGAP 8   < > 8 9 10    < > = values in this interval not displayed.     Hematology Recent Labs  Lab 03/27/18 1103  WBC 7.7  RBC 3.53*  HGB 9.9*  HCT 31.5*  MCV 89.2  MCH 28.0  MCHC 31.4  RDW 16.1*  PLT 153    Cardiac EnzymesNo results for input(s): TROPONINI in the last 168 hours. No results for input(s): TROPIPOC in the last 168 hours.   BNP No results for input(s): BNP, PROBNP in the last 168 hours.   DDimer No results for input(s): DDIMER in the last 168 hours.   Radiology    No results found.  Cardiac Studies    Echo 03/05/18  Study Conclusions  - Left ventricle: The cavity size was normal. Wall thickness was increased in a pattern of moderate LVH. The estimated ejection fraction was 43%. Diffuse hypokinesis. The study is not technically sufficient to allow evaluation of LV diastolic function. Ejection fraction (MOD, 2-plane): 43%. - Aortic valve: s/p TAVR valve. No obstruction. Trivial paravalvular leak. Mean gradient (S): 10 mm Hg. Peak gradient (S): 24 mm Hg. Valve area (VTI): 1.18 cm^2. Valve area (Vmax): 1.09 cm^2. Valve area (Vmean): 1.2 cm^2. - Left atrium: The atrium was mildly  dilated. - Right ventricle: The cavity size was mildly dilated. - Atrial septum: Small atrial septal defect. - Tricuspid valve: There was moderate regurgitation. - Pulmonary arteries: PA peak pressure: 47 mm Hg (S). - Inferior vena cava: The vessel was dilated. The respirophasic diameter changes were blunted (<50%), consistent with elevated central venous pressure.  Impressions:  - Compared to a prior study in 04/2017, the LVEF is lower at 43% with global hypokinesis. The TAVR valve is non-obstructed with trivial perivalvular leak and has a mean gradient of 10 mmHg. A small atrial septal defect is noted. There is moderate TR with an RVSP of 47 mmHg and a dilated IVC.   Cardiac cath 4/18  Difficult procedure due to severe angulation in the right innominate/ascending aortic region that produced catheter resistance to torque and advancement.  Moderate aortic stenosis with a transvalvular gradient of 29 mmHg (mean) and 33-38 mmHg (peak to peak). Calculated aortic valve area 1.35 cm.  Elevated left ventricular end-diastolic pressure and pulmonary capillary wedge pressure. Known LV systolic function with EF greater than 50% by recent echo suggesting chronic diastolic left heart failure.  Heavy three-vessel coronary calcification.  100% stenosis in the mid body of the second diagonal. There is diffuse 30-50% narrowing in the LAD.  The circumflex coronary is dominant. Moderate to heavy calcification is noted throughout the distal vessel and in the proximal segment. There is eccentric 50-90% stenosis proximal to the origin of the L-PDA.  Nondominant right coronary. Not selectively engaged due to poor catheter control. The vessel is patent and supplies only the right ventricle.     Patient Profile     84 y.o. male with a history of AS s/p TAVR in July 4627, chronic diastolic HF (HFpEF), CAD w/ LHC 12-2016 as below, DM2, HTN, dyslipidemia, OSA and a fib new  and was seen  in the office 03/24/18 for increased SOB, significant edema- and orthopnea.  On the 19th pt preferred to be admitted 03/27/18 with plans to diuresis and then arrange  TEE DCCV.  We believe edema/ HF will return until pt back in SR.   Assessment & Plan    Acute on chronic systolic and diastolic heart failure -Continue with IV Lasix 80 twice a day.    --neg 10,895 since admit and wt down from 246 to 227 (19 lbs) --neg 1.9L overnight - net negative 13L --continue lasix 80 mg IV BID --plan for DCCV Monday  Persistent atrial fibrillation -Eliquis, no bleeding, heart rate in the 60s, plan for cardioversion once euvolemic.  This will help atrial kick. --no missed doses -- Plan for DCCV Monday  Essential hypertension - Overall reasonably controlled.  Avapro, Norvasc. Can hold if needed.  Norvasc stopped with lower BPs  --BP 120/63 to 144/94.  Acute kidney injury -Creatinine mildly increased to 1.5 back to 1.34 with diuresis.  Today Cr is 1.40 --Continue to monitor.  To be expected.  Note, on low-dose Avapro.  Morbid obesity -Continue to encourage weight loss.  Hx of TAVR stable on last Echo  CAD 3 vessel CAD calcification with last cath 12/2017, no angina   DM-2 glucose controlled 105 to 211. On SSI and lower dose home NPH --amaryl and metformin on hold  Pixie Casino, MD, [facility], [place] Director of the Advanced Lipid Disorders &  Cardiovascular Risk Reduction Clinic Diplomate of the American Board of Clinical Lipidology Attending Cardiologist  Direct Dial: [phone]  Fax: [phone]  Website:  [email or web address]

## 2018-04-01 NOTE — Plan of Care (Signed)
  Problem: Clinical Measurements: Goal: Ability to maintain clinical measurements within normal limits will improve Outcome: Progressing   Problem: Clinical Measurements: Goal: Diagnostic test results will improve Outcome: Progressing   Problem: Clinical Measurements: Goal: Cardiovascular complication will be avoided Outcome: Progressing   

## 2018-04-01 NOTE — Progress Notes (Signed)
RT NOTE:  Pt refuses to wear CPAP. RT available if patient changes his mind.

## 2018-04-02 LAB — BASIC METABOLIC PANEL
Anion gap: 10 (ref 5–15)
BUN: 33 mg/dL — ABNORMAL HIGH (ref 8–23)
CHLORIDE: 98 mmol/L (ref 98–111)
CO2: 29 mmol/L (ref 22–32)
CREATININE: 1.55 mg/dL — AB (ref 0.61–1.24)
Calcium: 9.3 mg/dL (ref 8.9–10.3)
GFR calc Af Amer: 46 mL/min — ABNORMAL LOW (ref >60)
GFR calc non Af Amer: 40 mL/min — ABNORMAL LOW (ref >60)
GLUCOSE: 185 mg/dL — AB (ref 70–99)
Potassium: 4.2 mmol/L (ref 3.5–5.1)
SODIUM: 137 mmol/L (ref 135–145)

## 2018-04-02 LAB — GLUCOSE, CAPILLARY
GLUCOSE-CAPILLARY: 161 mg/dL — AB (ref 70–99)
GLUCOSE-CAPILLARY: 235 mg/dL — AB (ref 70–99)
Glucose-Capillary: 191 mg/dL — ABNORMAL HIGH (ref 70–99)
Glucose-Capillary: 227 mg/dL — ABNORMAL HIGH (ref 70–99)

## 2018-04-02 NOTE — Progress Notes (Signed)
Pt declined use of CPAP 

## 2018-04-02 NOTE — Progress Notes (Addendum)
Progress Note  Patient Name: Todd Romero Date of Encounter: 04/02/2018  Primary Cardiologist: [name], MD   Subjective   More diuresis overnight - another 2L negative - now 15L negative.  Creatinine stable at 1.55.  Inpatient Medications    Scheduled Meds: . apixaban  5 mg Oral BID  . ferrous sulfate  325 mg Oral BID WC  . furosemide  80 mg Intravenous BID  . insulin aspart  0-5 Units Subcutaneous QHS  . insulin aspart  0-9 Units Subcutaneous TID WC  . insulin NPH Human  22 Units Subcutaneous QHS  . irbesartan  37.5 mg Oral Daily  . multivitamin   Oral BID  . multivitamin with minerals  1 tablet Oral Daily  . omega-3 acid ethyl esters  1 g Oral Q1200  . pantoprazole  40 mg Oral BID  . pravastatin  80 mg Oral QPM  . QUEtiapine  25 mg Oral QHS  . sodium chloride flush  3 mL Intravenous Q12H  . sodium chloride flush  3 mL Intravenous Q12H  . tamsulosin  0.4 mg Oral QPC supper  . vitamin C  500 mg Oral Daily   Continuous Infusions: . sodium chloride    . [START ON 04/03/2018] sodium chloride     PRN Meds: acetaminophen, ondansetron (ZOFRAN) IV, sodium chloride flush   Vital Signs    Vitals:   04/01/18 0825 04/01/18 1208 04/01/18 1924 04/02/18 0529  BP: 134/70 (!) 132/59 106/70 132/77  Pulse: 65 60 (!) 56 (!) 52  Resp:  20 16 18   Temp:  98.5 F (36.9 C) 98.7 F (37.1 C) 98 F (36.7 C)  TempSrc:  Oral Oral Oral  SpO2:  97% 98% 99%  Weight:    224 lb (101.6 kg)  Height:        Intake/Output Summary (Last 24 hours) at 04/02/2018 1019 Last data filed at 04/02/2018 0900 Gross per 24 hour  Intake 1440 ml  Output 3200 ml  Net -1760 ml   Filed Weights   03/31/18 0152 04/01/18 0500 04/02/18 0529  Weight: 227 lb 12.8 oz (103.3 kg) 226 lb 11.2 oz (102.8 kg) 224 lb (101.6 kg)    Telemetry   A fib with CVR in 60's- Personally Reviewed  ECG    N/A  Physical Exam   General appearance: alert and no distress Neck: JVD - 1 cm above sternal  notch, no carotid bruit and thyroid not enlarged, symmetric, no tenderness/mass/nodules Lungs: diminished breath sounds bibasilar Heart: irregularly irregular rhythm Extremities: edema 1-2+ bilateral edema Neurologic: Grossly normal  Labs    Chemistry Recent Labs  Lab 03/27/18 1103  03/31/18 0545 04/01/18 0422 04/02/18 0837  NA 140   < > 139 135 137  K 4.2   < > 3.8 4.1 4.2  CL 105   < > 100 97* 98  CO2 27   < > 30 28 29   GLUCOSE 149*   < > 98 182* 185*  BUN 35*   < > 30* 36* 33*  CREATININE 1.40*   < > 1.40* 1.54* 1.55*  CALCIUM 8.9   < > 9.1 9.1 9.3  PROT 6.5  --   --   --   --   ALBUMIN 3.6  --   --   --   --   AST 21  --   --   --   --   ALT 15  --   --   --   --  ALKPHOS 65  --   --   --   --   BILITOT 0.9  --   --   --   --   GFRNONAA 45*   < > 45* 40* 40*  GFRAA 52*   < > 52* 47* 46*  ANIONGAP 8   < > 9 10 10    < > = values in this interval not displayed.     Hematology Recent Labs  Lab 03/27/18 1103  WBC 7.7  RBC 3.53*  HGB 9.9*  HCT 31.5*  MCV 89.2  MCH 28.0  MCHC 31.4  RDW 16.1*  PLT 153    Cardiac EnzymesNo results for input(s): TROPONINI in the last 168 hours. No results for input(s): TROPIPOC in the last 168 hours.   BNP No results for input(s): BNP, PROBNP in the last 168 hours.   DDimer No results for input(s): DDIMER in the last 168 hours.   Radiology    No results found.  Cardiac Studies    Echo 03/05/18  Study Conclusions  - Left ventricle: The cavity size was normal. Wall thickness was increased in a pattern of moderate LVH. The estimated ejection fraction was 43%. Diffuse hypokinesis. The study is not technically sufficient to allow evaluation of LV diastolic function. Ejection fraction (MOD, 2-plane): 43%. - Aortic valve: s/p TAVR valve. No obstruction. Trivial paravalvular leak. Mean gradient (S): 10 mm Hg. Peak gradient (S): 24 mm Hg. Valve area (VTI): 1.18 cm^2. Valve area (Vmax): 1.09 cm^2. Valve area  (Vmean): 1.2 cm^2. - Left atrium: The atrium was mildly dilated. - Right ventricle: The cavity size was mildly dilated. - Atrial septum: Small atrial septal defect. - Tricuspid valve: There was moderate regurgitation. - Pulmonary arteries: PA peak pressure: 47 mm Hg (S). - Inferior vena cava: The vessel was dilated. The respirophasic diameter changes were blunted (<50%), consistent with elevated central venous pressure.  Impressions:  - Compared to a prior study in 04/2017, the LVEF is lower at 43% with global hypokinesis. The TAVR valve is non-obstructed with trivial perivalvular leak and has a mean gradient of 10 mmHg. A small atrial septal defect is noted. There is moderate TR with an RVSP of 47 mmHg and a dilated IVC.   Cardiac cath 4/18  Difficult procedure due to severe angulation in the right innominate/ascending aortic region that produced catheter resistance to torque and advancement.  Moderate aortic stenosis with a transvalvular gradient of 29 mmHg (mean) and 33-38 mmHg (peak to peak). Calculated aortic valve area 1.35 cm.  Elevated left ventricular end-diastolic pressure and pulmonary capillary wedge pressure. Known LV systolic function with EF greater than 50% by recent echo suggesting chronic diastolic left heart failure.  Heavy three-vessel coronary calcification.  100% stenosis in the mid body of the second diagonal. There is diffuse 30-50% narrowing in the LAD.  The circumflex coronary is dominant. Moderate to heavy calcification is noted throughout the distal vessel and in the proximal segment. There is eccentric 50-90% stenosis proximal to the origin of the L-PDA.  Nondominant right coronary. Not selectively engaged due to poor catheter control. The vessel is patent and supplies only the right ventricle.   Patient Profile     84 y.o. male with a history of AS s/p TAVR in July 7672, chronic diastolic HF (HFpEF), CAD w/ LHC 12-2016 as below,  DM2, HTN, dyslipidemia, OSA and a fib new  and was seen in the office 03/24/18 for increased SOB, significant edema- and orthopnea.  On the 19th  pt preferred to be admitted 03/27/18 with plans to diuresis and then arrange TEE DCCV.  We believe edema/ HF will return until pt back in [place].   Assessment & Plan    Acute on chronic systolic and diastolic heart failure -Continue with IV Lasix 80 twice a day.    --neg 10,895 since admit and wt down from 246 to 227 (19 lbs) --neg 1.9L overnight - net negative 13L --continue lasix 80 mg IV BID --plan for DCCV Monday  Persistent atrial fibrillation -Eliquis, no bleeding, heart rate in the 60s, plan for cardioversion once euvolemic.  This will help atrial kick. --no missed doses -- Plan for DCCV Monday with Dr. Johnsie Cancel  Essential hypertension - Overall reasonably controlled.  Avapro, Norvasc. Can hold if needed.  Norvasc stopped with lower BPs  --BP 120/63 to 144/94.  Acute kidney injury -Creatinine mildly increased to 1.5 back to 1.34 with diuresis.  Today Cr is 1.55 --Continue to monitor.   Morbid obesity -Continue to encourage weight loss.  Hx of TAVR stable on last Echo  CAD 3 vessel CAD calcification with last cath 12/2017, no angina   DM-2 glucose controlled 105 to 211. On SSI and lower dose home NPH --amaryl and metformin on hold  Pixie Casino, MD, [facility], [place] Director of the Advanced Lipid Disorders &  Cardiovascular Risk Reduction Clinic Diplomate of the American Board of Clinical Lipidology Attending Cardiologist  Direct Dial: [phone]  Fax: [phone]  Website:  [email or web address]

## 2018-04-03 ENCOUNTER — Encounter (HOSPITAL_COMMUNITY): Payer: Self-pay

## 2018-04-03 ENCOUNTER — Encounter (HOSPITAL_COMMUNITY)
Admission: RE | Disposition: A | Discharge status: Home / Self Care | Payer: Self-pay | Source: Home / Self Care | Attending: Cardiology

## 2018-04-03 ENCOUNTER — Inpatient Hospital Stay (HOSPITAL_COMMUNITY): Payer: Medicare Other | Admitting: Certified Registered Nurse Anesthetist

## 2018-04-03 ENCOUNTER — Other Ambulatory Visit: Payer: Self-pay

## 2018-04-03 DIAGNOSIS — I480 Paroxysmal atrial fibrillation: Secondary | ICD-10-CM

## 2018-04-03 HISTORY — PX: CARDIOVERSION: SHX1299

## 2018-04-03 LAB — BASIC METABOLIC PANEL
ANION GAP: 11 (ref 5–15)
BUN: 38 mg/dL — ABNORMAL HIGH (ref 8–23)
CO2: 27 mmol/L (ref 22–32)
Calcium: 9.4 mg/dL (ref 8.9–10.3)
Chloride: 100 mmol/L (ref 98–111)
Creatinine, Ser: 1.58 mg/dL — ABNORMAL HIGH (ref 0.61–1.24)
GFR, EST AFRICAN AMERICAN: 45 mL/min — AB (ref >60)
GFR, EST NON AFRICAN AMERICAN: 39 mL/min — AB (ref >60)
GLUCOSE: 104 mg/dL — AB (ref 70–99)
POTASSIUM: 4 mmol/L (ref 3.5–5.1)
Sodium: 138 mmol/L (ref 135–145)

## 2018-04-03 LAB — CBC WITH DIFFERENTIAL/PLATELET
ABS IMMATURE GRANULOCYTES: 0 10*3/uL (ref 0.0–0.1)
BASOS PCT: 1 %
Basophils Absolute: 0.1 10*3/uL (ref 0.0–0.1)
Eosinophils Absolute: 0.3 10*3/uL (ref 0.0–0.7)
Eosinophils Relative: 3 %
HCT: 36.7 % — ABNORMAL LOW (ref 39.0–52.0)
Hemoglobin: 11.7 g/dL — ABNORMAL LOW (ref 13.0–17.0)
IMMATURE GRANULOCYTES: 0 %
LYMPHS ABS: 2.2 10*3/uL (ref 0.7–4.0)
Lymphocytes Relative: 28 %
MCH: 28 pg (ref 26.0–34.0)
MCHC: 31.9 g/dL (ref 30.0–36.0)
MCV: 87.8 fL (ref 78.0–100.0)
MONOS PCT: 13 %
Monocytes Absolute: 1 10*3/uL (ref 0.1–1.0)
NEUTROS ABS: 4.2 10*3/uL (ref 1.7–7.7)
NEUTROS PCT: 55 %
PLATELETS: 160 10*3/uL (ref 150–400)
RBC: 4.18 MIL/uL — ABNORMAL LOW (ref 4.22–5.81)
RDW: 15.8 % — ABNORMAL HIGH (ref 11.5–15.5)
WBC: 7.7 10*3/uL (ref 4.0–10.5)

## 2018-04-03 LAB — GLUCOSE, CAPILLARY
GLUCOSE-CAPILLARY: 101 mg/dL — AB (ref 70–99)
GLUCOSE-CAPILLARY: 112 mg/dL — AB (ref 70–99)
GLUCOSE-CAPILLARY: 225 mg/dL — AB (ref 70–99)
Glucose-Capillary: 206 mg/dL — ABNORMAL HIGH (ref 70–99)

## 2018-04-03 SURGERY — CARDIOVERSION
Anesthesia: General

## 2018-04-03 MED ORDER — ONDANSETRON HCL 4 MG/2ML IJ SOLN: 4.0000 mg | Freq: Once | INTRAMUSCULAR | Status: DC | PRN

## 2018-04-03 MED ORDER — LIDOCAINE 2% (20 MG/ML) 5 ML SYRINGE
INTRAMUSCULAR | Status: DC | PRN
  Administered 2018-04-03: 40 mg via INTRAVENOUS

## 2018-04-03 MED ORDER — PROPOFOL 10 MG/ML IV BOLUS
INTRAVENOUS | Status: DC | PRN
  Administered 2018-04-03: 50 mg via INTRAVENOUS

## 2018-04-03 MED ORDER — HYDROCORTISONE 0.5 % EX CREA
TOPICAL_CREAM | CUTANEOUS | Status: DC | PRN
  Administered 2018-04-03: 17:00:00 via TOPICAL
  Filled 2018-04-03: qty 28.35

## 2018-04-03 MED ORDER — SODIUM CHLORIDE 0.9 % IV SOLN
INTRAVENOUS | Status: DC
  Administered 2018-04-03: 10:00:00 via INTRAVENOUS

## 2018-04-03 NOTE — Progress Notes (Signed)
Patient has refused CPAP HS.

## 2018-04-03 NOTE — Anesthesia Preprocedure Evaluation (Addendum)
Anesthesia Evaluation  Patient identified by MRN, date of birth, ID band Patient awake    Reviewed: Allergy & Precautions, NPO status , Patient's Chart, lab work & pertinent test results  Airway Mallampati: III  TM Distance: >3 FB Neck ROM: Full    Dental no notable dental hx. (+) Teeth Intact   Pulmonary shortness of breath and with exertion, sleep apnea , pneumonia, resolved,    Pulmonary exam normal breath sounds clear to auscultation       Cardiovascular hypertension, Pt. on medications + CAD and +CHF  + dysrhythmias Atrial Fibrillation + Valvular Problems/Murmurs AS  Rhythm:Irregular Rate:Normal  S/P TAVR   Neuro/Psych negative neurological ROS  negative psych ROS   GI/Hepatic Neg liver ROS, Hx/o GI bleed   Endo/Other  diabetes, Well Controlled, Type 2, Oral Hypoglycemic Agents, Insulin DependentObesity  Renal/GU negative Renal ROS  negative genitourinary   Musculoskeletal  (+) Arthritis , Osteoarthritis,    Abdominal (+) + obese,   Peds  Hematology  (+) anemia , Eliquis- last dose   Anesthesia Other Findings   Reproductive/Obstetrics                           Anesthesia Physical Anesthesia Plan  ASA: III  Anesthesia Plan: General   Post-op Pain Management:    Induction: Intravenous  PONV Risk Score and Plan: 2 and Ondansetron, Propofol infusion and Treatment may vary due to age or medical condition  Airway Management Planned: Mask  Additional Equipment:   Intra-op Plan:   Post-operative Plan:   Informed Consent: I have reviewed the patients History and Physical, chart, labs and discussed the procedure including the risks, benefits and alternatives for the proposed anesthesia with the patient or authorized representative who has indicated his/her understanding and acceptance.   Dental advisory given  Plan Discussed with: CRNA and Surgeon  Anesthesia Plan Comments:          Anesthesia Quick Evaluation

## 2018-04-03 NOTE — Progress Notes (Addendum)
Progress Note  Patient Name: Todd Romero Date of Encounter: 04/03/2018  Primary Cardiologist:   Sinclair Grooms, MD   Subjective   Breathing better.  No pain.  Legs improved.    Inpatient Medications    Scheduled Meds: . apixaban  5 mg Oral BID  . ferrous sulfate  325 mg Oral BID WC  . furosemide  80 mg Intravenous BID  . insulin aspart  0-5 Units Subcutaneous QHS  . insulin aspart  0-9 Units Subcutaneous TID WC  . insulin NPH Human  22 Units Subcutaneous QHS  . irbesartan  37.5 mg Oral Daily  . multivitamin   Oral BID  . multivitamin with minerals  1 tablet Oral Daily  . omega-3 acid ethyl esters  1 g Oral Q1200  . pantoprazole  40 mg Oral BID  . pravastatin  80 mg Oral QPM  . QUEtiapine  25 mg Oral QHS  . sodium chloride flush  3 mL Intravenous Q12H  . sodium chloride flush  3 mL Intravenous Q12H  . tamsulosin  0.4 mg Oral QPC supper  . vitamin C  500 mg Oral Daily   Continuous Infusions: . sodium chloride    . sodium chloride     PRN Meds: acetaminophen, ondansetron (ZOFRAN) IV, sodium chloride flush   Vital Signs    Vitals:   04/02/18 0529 04/02/18 1216 04/02/18 2002 04/03/18 0517  BP: 132/77 110/66 (!) 142/68 137/64  Pulse: (!) 52 61 (!) 58 61  Resp: 18 20 18 20   Temp: 98 F (36.7 C) 98.2 F (36.8 C) 98.1 F (36.7 C) (!) 97.5 F (36.4 C)  TempSrc: Oral Oral Oral Oral  SpO2: 99% 99% 99% 98%  Weight: 224 lb (101.6 kg)   222 lb (100.7 kg)  Height:        Intake/Output Summary (Last 24 hours) at 04/03/2018 0815 Last data filed at 04/03/2018 0519 Gross per 24 hour  Intake 723 ml  Output 3325 ml  Net -2602 ml   Filed Weights   04/01/18 0500 04/02/18 0529 04/03/18 0517  Weight: 226 lb 11.2 oz (102.8 kg) 224 lb (101.6 kg) 222 lb (100.7 kg)    Telemetry    Atrial fib with rate control.  - Personally Reviewed  ECG    NA - Personally Reviewed  Physical Exam   GEN: No acute distress.   Neck: No  JVD Cardiac: Irregular RR, slight  systolic murmurs, rubs, or gallops.  Respiratory: Clear  to auscultation bilaterally. GI: Soft, nontender, non-distended  MS:  Mild/mod leg edema; No deformity. Neuro:  Nonfocal  Psych: Normal affect   Labs    Chemistry Recent Labs  Lab 03/27/18 1103  04/01/18 0422 04/02/18 0837 04/03/18 0554  NA 140   < > 135 137 138  K 4.2   < > 4.1 4.2 4.0  CL 105   < > 97* 98 100  CO2 27   < > 28 29 27   GLUCOSE 149*   < > 182* 185* 104*  BUN 35*   < > 36* 33* 38*  CREATININE 1.40*   < > 1.54* 1.55* 1.58*  CALCIUM 8.9   < > 9.1 9.3 9.4  PROT 6.5  --   --   --   --   ALBUMIN 3.6  --   --   --   --   AST 21  --   --   --   --   ALT 15  --   --   --   --  ALKPHOS 65  --   --   --   --   BILITOT 0.9  --   --   --   --   GFRNONAA 45*   < > 40* 40* 39*  GFRAA 52*   < > 47* 46* 45*  ANIONGAP 8   < > 10 10 11    < > = values in this interval not displayed.     Hematology Recent Labs  Lab 03/27/18 1103 04/03/18 0554  WBC 7.7 7.7  RBC 3.53* 4.18*  HGB 9.9* 11.7*  HCT 31.5* 36.7*  MCV 89.2 87.8  MCH 28.0 28.0  MCHC 31.4 31.9  RDW 16.1* 15.8*  PLT 153 160    Cardiac EnzymesNo results for input(s): TROPONINI in the last 168 hours. No results for input(s): TROPIPOC in the last 168 hours.   BNPNo results for input(s): BNP, PROBNP in the last 168 hours.   DDimer No results for input(s): DDIMER in the last 168 hours.   Radiology    No results found.  Cardiac Studies   NA  Patient Profile     84 y.o. male with a history of AS s/p TAVR in July 3254, chronic diastolic HF (HFpEF), CAD w/ LHC 12-2016 as below, DM2, HTN, dyslipidemia, OSAand a fib new and was seen in the office 03/24/18 for increased SOB, significant edema- and orthopnea. On the 19th pt preferred to be admitted 03/27/18 with plans to diuresis and then arrange TEE DCCV. We believe edema/HF willreturn until pt back in SR.   Assessment & Plan    ACUTE ON CHRONIC SYSTOLIC AND DIASTOLIC HF:   Down 18 liters since  admission and weight is down 24 lbs.  Continue IV Lasix today.    ATRIAL FIB:  DCCV today.  No indication for TEE has been on adequate Eliquis. I confirmed that he has been on this since 7/2  HTN:  BP controlled.  Continue current therapy.   AKI:   Creat remains stable. Follow in AM  TAVR:  Stable on last echo.    DM:  Continue current meds.   SLEEP:  He says that he did not do well with Seroquel last night so I will stop this.    For questions or updates, please contact [place] Please consult [email or web address] for contact info under Cardiology/STEMI.   Signed, Minus Breeding, MD  04/03/2018, 8:15 AM

## 2018-04-03 NOTE — Progress Notes (Signed)
Inpatient Diabetes Program Recommendations  AACE/ADA: New Consensus Statement on Inpatient Glycemic Control (2015)  Target Ranges:  Prepandial:   less than 140 mg/dL      Peak postprandial:   less than 180 mg/dL (1-2 hours)      Critically ill patients:  140 - 180 mg/dL   Lab Results  Component Value Date   GLUCAP 112 (H) 04/03/2018   HGBA1C 6.9 (H) 02/10/2018    Review of Glycemic ControlResults for Todd Romero, Todd Romero (MRN [id number]) as of 04/03/2018 13:56  Ref. Range 04/02/2018 07:37 04/02/2018 12:13 04/02/2018 16:31 04/02/2018 21:06 04/03/2018 07:39 04/03/2018 11:37  Glucose-Capillary Latest Ref Range: 70 - 99 mg/dL 161 (H) 191 (H) 227 (H) 235 (H) 101 (H) 112 (H)    Diabetes history: Type 2 DM  Outpatient Diabetes medications: Amaryl 1 mg daily, Humalog 10 units daily, NPH 26 units q HS, Victoza 1.8 mg q HS, Glumetza 500 mg bid Current orders for Inpatient glycemic control:  Novolog sensitive tid with meals and HS, NPH 22 units q HS Inpatient Diabetes Program Recommendations:   Consider adding Novolog 3 units tid with meals (hold if patient NPO or eats less than 50%).   Thanks,  Adah Perl, RN, BC-ADM Inpatient Diabetes Coordinator Pager [phone] (8a-5p)

## 2018-04-03 NOTE — Anesthesia Postprocedure Evaluation (Signed)
Anesthesia Post Note  Patient: Todd Romero  Procedure(s) Performed: CARDIOVERSION (N/A )     Patient location during evaluation: PACU Anesthesia Type: General Level of consciousness: awake and alert and oriented Pain management: pain level controlled Vital Signs Assessment: post-procedure vital signs reviewed and stable Respiratory status: spontaneous breathing, nonlabored ventilation and respiratory function stable Cardiovascular status: blood pressure returned to baseline and stable Postop Assessment: no apparent nausea or vomiting Anesthetic complications: no    Last Vitals:  Vitals:   04/03/18 1014 04/03/18 1050  BP: (!) 115/49 (!) 128/59  Pulse: 66 61  Resp:  16  Temp: 36.7 C 36.5 C  SpO2: 96% 97%    Last Pain:  Vitals:   04/03/18 1050  TempSrc: Oral  PainSc: 0-No pain                 Vela Render A.

## 2018-04-03 NOTE — Transfer of Care (Signed)
Immediate Anesthesia Transfer of Care Note  Patient: Todd Romero  Procedure(s) Performed: CARDIOVERSION (N/A )  Patient Location: Endoscopy Unit  Anesthesia Type:MAC  Level of Consciousness: drowsy  Airway & Oxygen Therapy: Patient Spontanous Breathing  Post-op Assessment: Report given to RN and Post -op Vital signs reviewed and stable  Post vital signs: Reviewed and stable  Last Vitals:  Vitals Value Taken Time  BP    Temp    Pulse    Resp    SpO2      Last Pain:  Vitals:   04/03/18 1014  TempSrc: Oral  PainSc: 0-No pain         Complications: No apparent anesthesia complications

## 2018-04-03 NOTE — H&P (View-Only) (Signed)
Progress Note  Patient Name: Todd Romero Date of Encounter: 04/03/2018  Primary Cardiologist:   Sinclair Grooms, MD   Subjective   Breathing better.  No pain.  Legs improved.    Inpatient Medications    Scheduled Meds: . apixaban  5 mg Oral BID  . ferrous sulfate  325 mg Oral BID WC  . furosemide  80 mg Intravenous BID  . insulin aspart  0-5 Units Subcutaneous QHS  . insulin aspart  0-9 Units Subcutaneous TID WC  . insulin NPH Human  22 Units Subcutaneous QHS  . irbesartan  37.5 mg Oral Daily  . multivitamin   Oral BID  . multivitamin with minerals  1 tablet Oral Daily  . omega-3 acid ethyl esters  1 g Oral Q1200  . pantoprazole  40 mg Oral BID  . pravastatin  80 mg Oral QPM  . QUEtiapine  25 mg Oral QHS  . sodium chloride flush  3 mL Intravenous Q12H  . sodium chloride flush  3 mL Intravenous Q12H  . tamsulosin  0.4 mg Oral QPC supper  . vitamin C  500 mg Oral Daily   Continuous Infusions: . sodium chloride    . sodium chloride     PRN Meds: acetaminophen, ondansetron (ZOFRAN) IV, sodium chloride flush   Vital Signs    Vitals:   04/02/18 0529 04/02/18 1216 04/02/18 2002 04/03/18 0517  BP: 132/77 110/66 (!) 142/68 137/64  Pulse: (!) 52 61 (!) 58 61  Resp: 18 20 18 20   Temp: 98 F (36.7 C) 98.2 F (36.8 C) 98.1 F (36.7 C) (!) 97.5 F (36.4 C)  TempSrc: Oral Oral Oral Oral  SpO2: 99% 99% 99% 98%  Weight: 224 lb (101.6 kg)   222 lb (100.7 kg)  Height:        Intake/Output Summary (Last 24 hours) at 04/03/2018 0815 Last data filed at 04/03/2018 0519 Gross per 24 hour  Intake 723 ml  Output 3325 ml  Net -2602 ml   Filed Weights   04/01/18 0500 04/02/18 0529 04/03/18 0517  Weight: 226 lb 11.2 oz (102.8 kg) 224 lb (101.6 kg) 222 lb (100.7 kg)    Telemetry    Atrial fib with rate control.  - Personally Reviewed  ECG    NA - Personally Reviewed  Physical Exam   GEN: No acute distress.   Neck: No  JVD Cardiac: Irregular RR, slight  systolic murmurs, rubs, or gallops.  Respiratory: Clear  to auscultation bilaterally. GI: Soft, nontender, non-distended  MS:  Mild/mod leg edema; No deformity. Neuro:  Nonfocal  Psych: Normal affect   Labs    Chemistry Recent Labs  Lab 03/27/18 1103  04/01/18 0422 04/02/18 0837 04/03/18 0554  NA 140   < > 135 137 138  K 4.2   < > 4.1 4.2 4.0  CL 105   < > 97* 98 100  CO2 27   < > 28 29 27   GLUCOSE 149*   < > 182* 185* 104*  BUN 35*   < > 36* 33* 38*  CREATININE 1.40*   < > 1.54* 1.55* 1.58*  CALCIUM 8.9   < > 9.1 9.3 9.4  PROT 6.5  --   --   --   --   ALBUMIN 3.6  --   --   --   --   AST 21  --   --   --   --   ALT 15  --   --   --   --  ALKPHOS 65  --   --   --   --   BILITOT 0.9  --   --   --   --   GFRNONAA 45*   < > 40* 40* 39*  GFRAA 52*   < > 47* 46* 45*  ANIONGAP 8   < > 10 10 11    < > = values in this interval not displayed.     Hematology Recent Labs  Lab 03/27/18 1103 04/03/18 0554  WBC 7.7 7.7  RBC 3.53* 4.18*  HGB 9.9* 11.7*  HCT 31.5* 36.7*  MCV 89.2 87.8  MCH 28.0 28.0  MCHC 31.4 31.9  RDW 16.1* 15.8*  PLT 153 160    Cardiac EnzymesNo results for input(s): TROPONINI in the last 168 hours. No results for input(s): TROPIPOC in the last 168 hours.   BNPNo results for input(s): BNP, PROBNP in the last 168 hours.   DDimer No results for input(s): DDIMER in the last 168 hours.   Radiology    No results found.  Cardiac Studies   NA  Patient Profile     84 y.o. male with a history of AS s/p TAVR in July 3664, chronic diastolic HF (HFpEF), CAD w/ LHC 12-2016 as below, DM2, HTN, dyslipidemia, OSAand a fib new and was seen in the office 03/24/18 for increased SOB, significant edema- and orthopnea. On the 19th pt preferred to be admitted 03/27/18 with plans to diuresis and then arrange TEE DCCV. We believe edema/HF willreturn until pt back in SR.   Assessment & Plan    ACUTE ON CHRONIC SYSTOLIC AND DIASTOLIC HF:   Down 18 liters since  admission and weight is down 24 lbs.  Continue IV Lasix today.    ATRIAL FIB:  DCCV today.  No indication for TEE has been on adequate Eliquis. I confirmed that he has been on this since 7/2  HTN:  BP controlled.  Continue current therapy.   AKI:   Creat remains stable. Follow in AM  TAVR:  Stable on last echo.    DM:  Continue current meds.   For questions or updates, please contact [place] Please consult [email or web address] for contact info under Cardiology/STEMI.   Signed, Minus Breeding, MD  04/03/2018, 8:15 AM

## 2018-04-03 NOTE — Interval H&P Note (Signed)
History and Physical Interval Note:  04/03/2018 10:32 AM  Todd Romero  has presented today for surgery, with the diagnosis of ATRIAL FIBRILLATION  The various methods of treatment have been discussed with the patient and family. After consideration of risks, benefits and other options for treatment, the patient has consented to  Procedure(s): CARDIOVERSION (N/A) as a surgical intervention .  The patient's history has been reviewed, patient examined, no change in status, stable for surgery.  I have reviewed the patient's chart and labs.  Questions were answered to the patient's satisfaction.     Jenkins Rouge

## 2018-04-03 NOTE — CV Procedure (Signed)
DCC: Anesthesia: Dr Royce Macadamia Propofol/Lidocaine  [place] x 2 150 J then 200 J  Converted from afib rate 80 to NSR with first degree rate 64 bpm  On Rx eliquis with no missed doses  No immediate neurologic sequelae  Jenkins Rouge

## 2018-04-04 ENCOUNTER — Encounter (HOSPITAL_COMMUNITY): Payer: Self-pay | Admitting: Cardiovascular Disease

## 2018-04-04 ENCOUNTER — Telehealth: Payer: Self-pay | Admitting: Interventional Cardiology

## 2018-04-04 ENCOUNTER — Other Ambulatory Visit: Payer: Self-pay

## 2018-04-04 LAB — BASIC METABOLIC PANEL
ANION GAP: 7 (ref 5–15)
BUN: 41 mg/dL — ABNORMAL HIGH (ref 8–23)
CO2: 29 mmol/L (ref 22–32)
Calcium: 9.2 mg/dL (ref 8.9–10.3)
Chloride: 97 mmol/L — ABNORMAL LOW (ref 98–111)
Creatinine, Ser: 1.58 mg/dL — ABNORMAL HIGH (ref 0.61–1.24)
GFR calc Af Amer: 45 mL/min — ABNORMAL LOW (ref >60)
GFR, EST NON AFRICAN AMERICAN: 39 mL/min — AB (ref >60)
GLUCOSE: 159 mg/dL — AB (ref 70–99)
POTASSIUM: 4.2 mmol/L (ref 3.5–5.1)
Sodium: 133 mmol/L — ABNORMAL LOW (ref 135–145)

## 2018-04-04 LAB — GLUCOSE, CAPILLARY: GLUCOSE-CAPILLARY: 158 mg/dL — AB (ref 70–99)

## 2018-04-04 MED ORDER — LIVING BETTER WITH HEART FAILURE BOOK
Freq: Once | Status: DC
  Filled 2018-04-04: qty 1

## 2018-04-04 MED ORDER — FUROSEMIDE 80 MG PO TABS: 80.0000 mg | ORAL_TABLET | Freq: Two times a day (BID) | ORAL | 11 refills | Status: DC

## 2018-04-04 NOTE — Progress Notes (Signed)
Progress Note  Patient Name: Todd Romero Date of Encounter: 04/04/2018  Primary Cardiologist:   Sinclair Grooms, MD   Subjective   Breathing OK.  No pain.   Inpatient Medications    Scheduled Meds: . apixaban  5 mg Oral BID  . ferrous sulfate  325 mg Oral BID WC  . furosemide  80 mg Intravenous BID  . insulin aspart  0-5 Units Subcutaneous QHS  . insulin aspart  0-9 Units Subcutaneous TID WC  . insulin NPH Human  22 Units Subcutaneous QHS  . irbesartan  37.5 mg Oral Daily  . multivitamin   Oral BID  . multivitamin with minerals  1 tablet Oral Daily  . omega-3 acid ethyl esters  1 g Oral Q1200  . pantoprazole  40 mg Oral BID  . pravastatin  80 mg Oral QPM  . sodium chloride flush  3 mL Intravenous Q12H  . sodium chloride flush  3 mL Intravenous Q12H  . tamsulosin  0.4 mg Oral QPC supper  . vitamin C  500 mg Oral Daily   Continuous Infusions: . sodium chloride    . sodium chloride     PRN Meds: acetaminophen, hydrocortisone cream, ondansetron (ZOFRAN) IV, ondansetron (ZOFRAN) IV, sodium chloride flush   Vital Signs    Vitals:   04/03/18 1100 04/03/18 1140 04/03/18 2038 04/04/18 0524  BP: (!) 128/54 131/79 113/63 133/69  Pulse: 65 71 (!) 30 62  Resp: 18 18 16 18   Temp:  97.9 F (36.6 C) 98.2 F (36.8 C) 98.2 F (36.8 C)  TempSrc:  Oral Oral Oral  SpO2: 95% 100% 98% 97%  Weight:    220 lb 14.4 oz (100.2 kg)  Height:        Intake/Output Summary (Last 24 hours) at 04/04/2018 0833 Last data filed at 04/04/2018 0536 Gross per 24 hour  Intake 783 ml  Output 2725 ml  Net -1942 ml   Filed Weights   04/02/18 0529 04/03/18 0517 04/04/18 0524  Weight: 224 lb (101.6 kg) 222 lb (100.7 kg) 220 lb 14.4 oz (100.2 kg)    Telemetry    NSR with atrial ectopy.  - Personally Reviewed  ECG    NSR, rate 67, RBBB, first degree AV block.  - Personally Reviewed  Physical Exam   GEN: No  acute distress.   Neck: No  JVD Cardiac: RRR, no murmurs, rubs, or  gallops.  Respiratory: Clear  to auscultation bilaterally. GI: Soft, nontender, non-distended, normal bowel sounds  MS:  Mild  edema; No deformity. Neuro:   Nonfocal  Psych: Oriented and appropriate    Labs    Chemistry Recent Labs  Lab 04/02/18 0837 04/03/18 0554 04/04/18 0558  NA 137 138 133*  K 4.2 4.0 4.2  CL 98 100 97*  CO2 29 27 29   GLUCOSE 185* 104* 159*  BUN 33* 38* 41*  CREATININE 1.55* 1.58* 1.58*  CALCIUM 9.3 9.4 9.2  GFRNONAA 40* 39* 39*  GFRAA 46* 45* 45*  ANIONGAP 10 11 7      Hematology Recent Labs  Lab 04/03/18 0554  WBC 7.7  RBC 4.18*  HGB 11.7*  HCT 36.7*  MCV 87.8  MCH 28.0  MCHC 31.9  RDW 15.8*  PLT 160    Cardiac EnzymesNo results for input(s): TROPONINI in the last 168 hours. No results for input(s): TROPIPOC in the last 168 hours.   BNPNo results for input(s): BNP, PROBNP in the last 168 hours.   DDimer No results  for input(s): DDIMER in the last 168 hours.   Radiology    No results found.  Cardiac Studies   NA  Patient Profile     84 y.o. male with a history of AS s/p TAVR in July 6803, chronic diastolic HF (HFpEF), CAD w/ LHC 12-2016 as below, DM2, HTN, dyslipidemia, OSAand a fib new and was seen in the office 03/24/18 for increased SOB, significant edema- and orthopnea. On the 19th pt preferred to be admitted 03/27/18 with plans to diuresis and then arrange DCCV.  Now status post cardioversion.   Assessment & Plan    ACUTE ON CHRONIC SYSTOLIC AND DIASTOLIC HF:   Down 20 liters since admission and weight is down 26 lbs.  OK to go home. Lasix 80 mg BID.  He needs to have education and I talked to him about salt/fluid and daily weights.  He needs to take an extra 40 mg of Lasix with two lb weight gain.  Needs TOC appt within 7 days in [address]  ATRIAL FIB:  DCCV yesterday.  NSR on EKG.  I suspect that he will at some point flip back into atrial fib.  Check EKG at follow up appt.    HTN:  BP controlled.  Continue current  meds.   AKI:   Creat remains stable. Follow as an outpatient.   TAVR:  NL appearance on last echo.   DM:  Diabetes management suggestions noted.  However, I will defer med changes to out patient MD.   SLEEP:  Seroquel stopped.     SLEEP APNEA:  Refused CPAP last night .   For questions or updates, please contact [place] Please consult [email or web address] for contact info under Cardiology/STEMI.   Signed, Minus Breeding, MD  04/04/2018, 8:33 AM

## 2018-04-04 NOTE — Progress Notes (Signed)
Heart Failure Navigator Consult Note  Presentation: Zacharius Funari Swinson is a 84 y.o.malewith a history of AS s/p TAVR in July 3329, chronic diastolic HF (HFpEF), CAD w/ LHC 12-2016 as below, DM2, HTN, dyslipidemia, OSAand a fib new and was seen in the office 03/24/18 for increased SOB, significant edema- and orthopnea. On the 19th pt preferred to be admitted 03/27/18 with plans to diuresis and then arrange TEE DCCV. It was felt that edema/HF willreturn until pt back in SR.   He does have a hx of GI bleed in 2018 but now no bleeding on Eliquis. Last Echo 03/05/18 mod LVH, EF 43% which is lower than previous. Diffuse hypokinesis. AV s/p TAVR with no obstruction and trivial paravalvular leak. LA mildly dilated, RV mildly dilated. PA pk pressure 47 mmHg.  "Compared to a prior study in 04/2017, the LVEF is lower at 43% with global hypokinesis. The TAVR valve is non-obstructed with trivial perivalvular leak and has a mean gradient of 10 mmHg. A small atrial septal defect is noted. There is moderate TR with an RVSP of 47 mmHg and a dilated IVC"  Pro-BNP on 03/24/18 was 1849. Home lasix had been increased from 80 mg to 120 mg in the am and 40 mg in the pm with little improvement.  Past Medical History:  Diagnosis Date  . Anemia   . Arthritis   . CAD (coronary artery disease)    a. cardiac cath 12/2016 showing moderate AS, elevated LVEDP, heavy 3V coronary calcification, 100% mD2, 30-50% LAD, 50-90% stenosis of Cx proximal to origin of L-PDA, RCA not engaged due to poor catheter control (difficult procedure) - coronary status was essentially unchanged from prior.  . Chronic diastolic CHF (congestive heart failure) ([place])   . Degenerative arthritis   . Diabetes ([place])   . Dyspnea   . Essential hypertension   . Hyperlipidemia   . Obesity (BMI 30-39.9) 06/18/2015  . OSA (obstructive sleep apnea)    Severe with AHI 27/hr now on CPAP  no cpap  . Peritonitis ([place]) 1985?  [name] Pneumonia    "6  months - 84 years old"  . Pure hypercholesterolemia   . RBBB   . [facility] spotted fever   . 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 Severe aortic stenosis    a. s/p TAVR 03/2017.  . Type II or unspecified type diabetes mellitus without mention of complication, not stated as uncontrolled     Social History   Socioeconomic History  . Marital status: Married    Spouse name: Not on file  . Number of children: 4  . Years of education: Not on file  . Highest education level: Not on file  Occupational History  . Occupation: Construction-Retired  Social Needs  . Financial resource strain: Not on file  . Food insecurity:    Worry: Not on file    Inability: Not on file  . Transportation needs:    Medical: Not on file    Non-medical: Not on file  Tobacco Use  . Smoking status: Never Smoker  . Smokeless tobacco: Never Used  Substance and Sexual Activity  . Alcohol use: No  . Drug use: No  . Sexual activity: Not on file  Lifestyle  . Physical activity:    Days per week: Not on file    Minutes per session: Not on file  . Stress: Not on  file  Relationships  . Social connections:    Talks on phone: Not on file    Gets together: Not on file    Attends religious service: Not on file    Active member of club or organization: Not on file    Attends meetings of clubs or organizations: Not on file    Relationship status: Not on file  Other Topics Concern  . Not on file  Social History Narrative  . Not on file    ECHO:Study Conclusions  - Left ventricle: The cavity size was normal. Wall thickness was   increased in a pattern of moderate LVH. The estimated ejection   fraction was 43%. Diffuse hypokinesis. The study is not   technically sufficient to allow evaluation of LV diastolic   function. Ejection fraction (MOD, 2-plane): 43%. - Aortic valve:  s/p TAVR valve. No obstruction. Trivial   paravalvular leak. Mean gradient (S): 10 mm Hg. Peak gradient   (S): 24 mm Hg. Valve area (VTI): 1.18 cm^2. Valve area (Vmax):   1.09 cm^2. Valve area (Vmean): 1.2 cm^2. - Left atrium: The atrium was mildly dilated. - Right ventricle: The cavity size was mildly dilated. - Atrial septum: Small atrial septal defect. - Tricuspid valve: There was moderate regurgitation. - Pulmonary arteries: PA peak pressure: 47 mm Hg (S). - Inferior vena cava: The vessel was dilated. The respirophasic   diameter changes were blunted (< 50%), consistent with elevated   central venous pressure.  Impressions:  - Compared to a prior study in 04/2017, the LVEF is lower at 43%   with global hypokinesis. The TAVR valve is non-obstructed with   trivial perivalvular leak and has a mean gradient of 10 mmHg. A   small atrial septal defect is noted. There is moderate TR with an   RVSP of 47 mmHg and a dilated IVC.  BNP    Component Value Date/Time   BNP 261.9 (H) 03/04/2018 1324   BNP 75.7 12/18/2015 1050    ProBNP    Component Value Date/Time   PROBNP 1,849 (H) 03/24/2018 1100   PROBNP 22.0 07/25/2014 1658     Education Assessment and Provision:  Detailed education and instructions provided on heart failure disease management including the following:  Signs and symptoms of Heart Failure When to call the physician Importance of daily weights Low sodium diet Fluid restriction Medication management Anticipated future follow-up appointments  Patient education given on each of the above topics.  Patient acknowledges understanding and acceptance of all instructions.  I spoke with patient regarding his current hospitalization and HF diagnosis.  He tells me that he has a scale at home, weighs daily and physician has instructed him to take extra Lasix (40mg ) with an increase of 2 pounds overnight.  I reviewed this from notes in chart as well as other indications for  notifying physician.  I also reviewed a low sodium diet and high sodium foods to avoid.  He and his wife admit that they enjoy [employer] and [employer].  I discouraged these types of high sodium foods  He denies any issues with getting or taking prescribed medications at home.  He will follow with Dr. Tamala Julian at [facility].  Education Materials:  "Living Better With Heart Failure" Booklet, Daily Weight Tracker Tool   High Risk Criteria for Readmission and/or Poor Patient Outcomes:   EF <30%- 43%  2 or more admissions in 6 months-no  Difficult social situation- denies  Demonstrates medication noncompliance- denies  Barriers of Care:  Knowledge and compliance  Discharge Planning:   Return to home in [place] with wife.

## 2018-04-04 NOTE — Telephone Encounter (Signed)
New Message   [facility] appointment made on 04/21/18 with Dr. Daneen Schick per P[place]olia Ades, changed appt to a [facility]

## 2018-04-04 NOTE — Discharge Summary (Addendum)
Discharge Summary    Patient ID: Todd Romero,  MRN: [id number], DOB/AGE: Nov 26, 1933 84 y.o.  Admit date: 03/27/2018 Discharge date: 04/04/2018  Primary Care Provider: [name] Primary Cardiologist: Sinclair Grooms, MD  Discharge Diagnoses    Active Problems:   Obstructive sleep apnea   S/P TAVR (transcatheter aortic valve replacement)   A-fib (HCC)   Acute on chronic combined systolic and diastolic HF (heart failure) (HCC)   Allergies Allergies  Allergen Reactions  . Bactrim [Sulfamethoxazole-Trimethoprim] Nausea And Vomiting and Other (See Comments)    Bleeding and ulcers  . Morphine And Related Nausea And Vomiting    Diagnostic Studies/Procedures    none _____________   History of Present Illness     Todd Romero is a 84 y.o. male with a history of AS s/p TAVR in July 4235, chronic diastolic HF (HFpEF), CAD w/ LHC 12-2016 as below, DM2, HTN, dyslipidemia, OSA and a fib new  and was seen in the office 03/24/18 for increased SOB, significant edema- and orthopnea.  On the 19th pt preferred to be admitted 03/27/18 with plans to diuresis and then arrange TEE DCCV.  It was felt that edema/ HF will return until pt back in SR.   He does have a hx of GI bleed in 2018 but now no bleeding on Eliquis. Last Echo 03/05/18 mod LVH, EF 43% which is lower than previous.  Diffuse hypokinesis.  AV s/p TAVR with no obstruction and trivial paravalvular leak.  LA mildly dilated, RV mildly dilated.  PA pk pressure 47 mmHg.  "Compared to a prior study in 04/2017, the LVEF is lower at 43% with global hypokinesis. The TAVR valve is non-obstructed with trivial perivalvular leak and has a mean gradient of 10 mmHg. A small atrial septal defect is noted. There is moderate TR with an RVSP of 47 mmHg and a dilated IVC"  Pro-BNP on 03/24/18 was 1849. Home lasix had been increased from 80 mg to 120 mg in the am and 40 mg in the pm with little improvement.   Hospital Course       Todd Romero was admitted on 03/27/2018 for management of acute on chronic combined systolic and diastolic heart failure with IV diuresis. Chest xray showed mild CHF with small bilateral pleural effusions. He was diuresed with lasix 80 mg IV  BID. His weight is down 26 pounds with 20 liters of fluid removed since admission. He underwent DCCV yesterday and cardioverted to sinus rhythm. He will likely return to atrial fib at some point. Will plan to check EKG at follow up. Telemetry has shown 1st degree AVB in the 50's-70 with PVCs. Not on beta blocker due to baseline bradycardia.   Blood pressure is well controlled. Creatinine has remained stable, 1.58 at discharge. K+ 4.2. He was seen by the diabetes coordinator. Home meds were continued and addition of meal time insulin as inpatient. Due to advanced age, will continue his prior regimen and have him follow up with primary care for further recommendations. Seroquel has been stopped.   He will be discharged on lasix 80 mg BID, with an extra 40 mg for wt gain of >=2 pounds. Living with Heart Failure booklet provided and pt educated on daily wts, what to report, low sodium diet, 2 gm, and medications. I had a long discussion with Todd Romero and his daughter. Todd Romero has been intolerant of CPAP in the past. We discussed the benefit of treating sleep apnea for improved  success of maintaining sinus rhythm in case he would be willing to try it again.  Patient has been seen by Dr. Percival Spanish today and deemed ready for discharge home. All follow up appointments have been scheduled. I attempted to get a 7 day TOC follow up but there was no availability. He will keep his appt with Dr. Tamala Julian on 8/16. Todd Romero will call the office if he has increased or new symptoms before then.  Discharge medications are listed below. _____________  Discharge Vitals Blood pressure (!) 122/50, pulse 72, temperature 98.2 F (36.8 C), temperature source Oral, resp. rate 18,  height 5\' 8"  (1.727 m), weight 220 lb 14.4 oz (100.2 kg), SpO2 97 %.  Filed Weights   04/02/18 0529 04/03/18 0517 04/04/18 0524  Weight: 224 lb (101.6 kg) 222 lb (100.7 kg) 220 lb 14.4 oz (100.2 kg)    Labs & Radiologic Studies    CBC Recent Labs    04/03/18 0554  WBC 7.7  NEUTROABS 4.2  HGB 11.7*  HCT 36.7*  MCV 87.8  PLT 858   Basic Metabolic Panel Recent Labs    04/03/18 0554 04/04/18 0558  NA 138 133*  K 4.0 4.2  CL 100 97*  CO2 27 29  GLUCOSE 104* 159*  BUN 38* 41*  CREATININE 1.58* 1.58*  CALCIUM 9.4 9.2   Liver Function Tests No results for input(s): AST, ALT, ALKPHOS, BILITOT, PROT, ALBUMIN in the last 72 hours. No results for input(s): LIPASE, AMYLASE in the last 72 hours. Cardiac Enzymes No results for input(s): CKTOTAL, CKMB, CKMBINDEX, TROPONINI in the last 72 hours. BNP Invalid input(s): POCBNP D-Dimer No results for input(s): DDIMER in the last 72 hours. Hemoglobin A1C No results for input(s): HGBA1C in the last 72 hours. Fasting Lipid Panel No results for input(s): CHOL, HDL, LDLCALC, TRIG, CHOLHDL, LDLDIRECT in the last 72 hours. Thyroid Function Tests No results for input(s): TSH, T4TOTAL, T3FREE, THYROIDAB in the last 72 hours.  Invalid input(s): FREET3 _____________  Dg Chest 2 View  Result Date: 03/28/2018 CLINICAL DATA:  Shortness of breath. History of CHF, coronary artery disease, transcatheter aortic valve replacement. EXAM: CHEST - 2 VIEW COMPARISON:  PA and lateral chest x-ray of March 04, 2018 FINDINGS: The lungs are well-expanded. The small bilateral pleural effusions are less conspicuous today. The interstitial markings are mildly prominent but also slightly improved over the previous study. The cardiac silhouette remains enlarged. There is calcification in the wall of the aortic arch. The prosthetic aortic valve cage is visible. There is multilevel degenerative disc disease of the thoracic spine. IMPRESSION: Mild CHF with small  bilateral pleural effusions slightly improved over the previous study. No alveolar pneumonia. Thoracic aortic atherosclerosis. Electronically Signed   By: David  Martinique M.D.   On: 03/28/2018 09:31   Disposition   Pt is being discharged home today in good condition.  Follow-up Plans & Appointments    Follow-up Information    Belva Crome, MD Follow up.   Specialty:  Cardiology Why:  Cardiology hospital follow up on August 16th at 10:40. Please arrive 15 minutes early for check in.  Contact information: [address] [place] [address] [phone]        [name], MD Follow up.   Specialty:  Endocrinology Why:  Keep appt with Dr. Dwyane Dee to discuss diabetes management.  Contact information: [address] [place] [address] [phone]          Discharge Instructions    (  HEART FAILURE PATIENTS) Call MD:  Anytime you have any of the following symptoms: 1) 3 pound weight gain in 24 hours or 5 pounds in 1 week 2) shortness of breath, with or without a dry hacking cough 3) swelling in the hands, feet or stomach 4) if you have to sleep on extra pillows at night in order to breathe.   Complete by:  As directed    Diet - low sodium heart healthy   Complete by:  As directed    Heart Failure patients record your daily weight using the same scale at the same time of day   Complete by:  As directed    Increase activity slowly   Complete by:  As directed    STOP any activity that causes chest pain, shortness of breath, dizziness, sweating, or exessive weakness   Complete by:  As directed       Discharge Medications   Allergies as of 04/04/2018      Reactions   Bactrim [sulfamethoxazole-trimethoprim] Nausea And Vomiting, Other (See Comments)   Bleeding and ulcers   Morphine And Related Nausea And Vomiting      Medication List    STOP taking these medications   amLODipine 2.5 MG tablet Commonly known as:  NORVASC     TAKE these  medications   acetaminophen 500 MG tablet Commonly known as:  TYLENOL Take 500 mg by mouth every 4 (four) hours as needed for moderate pain or headache.   apixaban 5 MG Tabs tablet Commonly known as:  ELIQUIS Take 1 tablet (5 mg total) by mouth 2 (two) times daily.   ferrous sulfate 325 (65 FE) MG tablet Take 325 mg by mouth 2 (two) times daily with a meal.   fish oil-omega-3 fatty acids 1000 MG capsule Take 1 g by mouth daily at 12 noon.   furosemide 80 MG tablet Commonly known as:  LASIX Take 1 tablet (80 mg total) by mouth 2 (two) times daily. Take an extra half tablet (40 mg) if weight goes up 2 pounds or more from day before. What changed:    how much to take  when to take this  additional instructions   glimepiride 2 MG tablet Commonly known as:  AMARYL Take 0.5 tablets (1 mg total) by mouth daily.   HUMALOG 100 UNIT/ML injection Generic drug:  insulin lispro Inject 10 Units into the skin daily. Take with largest meal   insulin NPH Human 100 UNIT/ML injection Commonly known as:  HUMULIN N,NOVOLIN N Inject 0.26 mLs (26 Units total) into the skin at bedtime. Uses Vial What changed:  additional instructions   metFORMIN 500 MG (MOD) 24 hr tablet Commonly known as:  GLUMETZA Take 1 tablet (500 mg total) by mouth 2 (two) times daily with a meal. (morning & noon)   multivitamin with minerals Tabs tablet Take 1 tablet by mouth daily. Mens One a day Vit   olmesartan 40 MG tablet Commonly known as:  BENICAR Take 1 tablet (40 mg total) by mouth daily.   pantoprazole 40 MG tablet Commonly known as:  PROTONIX Take 1 tablet (40 mg total) by mouth 2 (two) times daily.   pravastatin 80 MG tablet Commonly known as:  PRAVACHOL Take 1 tablet (80 mg total) by mouth every evening.   PRESERVISION AREDS 2 PO Take 1 capsule by mouth 2 (two) times daily.   tamsulosin 0.4 MG Caps capsule Commonly known as:  FLOMAX Take 1 capsule daily   VICTOZA 18 MG/3ML Sopn  Generic  drug:  liraglutide INJECT 1.8 MG UNDER THE SKIN AT BEDTIME   vitamin C 500 MG tablet Commonly known as:  ASCORBIC ACID Take 500 mg by mouth daily.        Acute coronary syndrome (MI, NSTEMI, STEMI, etc) this admission?: No.    Outstanding Labs/Studies   EKG at follow up.   Duration of Discharge Encounter   Greater than 30 minutes including physician time.  Signed, Daune Perch, NP 04/04/2018, 10:35 AM

## 2018-04-04 NOTE — Care Management Important Message (Signed)
Important Message  Patient Details  Name: Todd Romero MRN: [id number] Date of Birth: 1934-04-02   Medicare Important Message Given:  Yes    Loann Quill 04/04/2018, 11:30 AM

## 2018-04-05 NOTE — Telephone Encounter (Signed)
Patient contacted regarding discharge from [facility] on 04/04/18.  Patient understands to follow up with Dr Tamala Julian on 04/21/18 at 10:40 at [facility] in [place]. Patient understands discharge instructions? Yes Patient understands medications and regiment? Yes Patient understands to bring all medications to this visit? Yes

## 2018-04-07 ENCOUNTER — Other Ambulatory Visit: Payer: Self-pay | Admitting: Endocrinology

## 2018-04-07 ENCOUNTER — Other Ambulatory Visit (INDEPENDENT_AMBULATORY_CARE_PROVIDER_SITE_OTHER): Payer: Medicare Other

## 2018-04-07 DIAGNOSIS — Z794 Long term (current) use of insulin: Secondary | ICD-10-CM

## 2018-04-07 DIAGNOSIS — E1165 Type 2 diabetes mellitus with hyperglycemia: Secondary | ICD-10-CM | POA: Diagnosis not present

## 2018-04-07 DIAGNOSIS — D508 Other iron deficiency anemias: Secondary | ICD-10-CM | POA: Diagnosis not present

## 2018-04-07 LAB — COMPREHENSIVE METABOLIC PANEL
ALBUMIN: 4.1 g/dL (ref 3.5–5.2)
ALT: 10 U/L (ref 0–53)
AST: 16 U/L (ref 0–37)
Alkaline Phosphatase: 67 U/L (ref 39–117)
BUN: 60 mg/dL — AB (ref 6–23)
CO2: 24 mEq/L (ref 19–32)
CREATININE: 1.64 mg/dL — AB (ref 0.40–1.50)
Calcium: 9.4 mg/dL (ref 8.4–10.5)
Chloride: 99 mEq/L (ref 96–112)
GFR: 42.96 mL/min — ABNORMAL LOW (ref >60.00)
GLUCOSE: 195 mg/dL — AB (ref 70–99)
Potassium: 4.6 mEq/L (ref 3.5–5.1)
SODIUM: 133 meq/L — AB (ref 135–145)
TOTAL PROTEIN: 6.9 g/dL (ref 6.0–8.3)
Total Bilirubin: 0.5 mg/dL (ref 0.2–1.2)

## 2018-04-07 LAB — CBC
HCT: 34.1 % — ABNORMAL LOW (ref 39.0–52.0)
Hemoglobin: 11.4 g/dL — ABNORMAL LOW (ref 13.0–17.0)
MCHC: 33.4 g/dL (ref 30.0–36.0)
MCV: 86.3 fl (ref 78.0–100.0)
Platelets: 144 10*3/uL — ABNORMAL LOW (ref 150.0–400.0)
RBC: 3.95 Mil/uL — ABNORMAL LOW (ref 4.22–5.81)
RDW: 16.7 % — AB (ref 11.5–15.5)
WBC: 6.8 10*3/uL (ref 4.0–10.5)

## 2018-04-07 LAB — LIPID PANEL
CHOL/HDL RATIO: 3
Cholesterol: 134 mg/dL (ref 0–200)
HDL: 51.8 mg/dL (ref >39.00)
LDL CALC: 58 mg/dL (ref 0–99)
NONHDL: 82.38
Triglycerides: 124 mg/dL (ref 0.0–149.0)
VLDL: 24.8 mg/dL (ref 0.0–40.0)

## 2018-04-07 LAB — HEMOGLOBIN A1C: HEMOGLOBIN A1C: 8.2 % — AB (ref 4.6–6.5)

## 2018-04-11 ENCOUNTER — Telehealth: Payer: Self-pay | Admitting: Endocrinology

## 2018-04-11 ENCOUNTER — Other Ambulatory Visit: Payer: Medicare Other

## 2018-04-11 ENCOUNTER — Encounter: Payer: Self-pay | Admitting: Thoracic Surgery (Cardiothoracic Vascular Surgery)

## 2018-04-11 MED ORDER — PRAVASTATIN SODIUM 80 MG PO TABS: 80.0000 mg | ORAL_TABLET | Freq: Every evening | ORAL | 1 refills | Status: DC

## 2018-04-11 MED ORDER — TAMSULOSIN HCL 0.4 MG PO CAPS: ORAL_CAPSULE | ORAL | 1 refills | Status: DC

## 2018-04-11 NOTE — Telephone Encounter (Signed)
pravastatin (PRAVACHOL) 80 MG tablet    tamsulosin (FLOMAX) 0.4 MG CAPS capsule   Patient needs refill sent into pharmacy     [place], [place]

## 2018-04-11 NOTE — Telephone Encounter (Signed)
Sent!

## 2018-04-12 ENCOUNTER — Other Ambulatory Visit: Payer: Self-pay

## 2018-04-12 ENCOUNTER — Telehealth: Payer: Self-pay | Admitting: Emergency Medicine

## 2018-04-12 MED ORDER — PRAVASTATIN SODIUM 80 MG PO TABS: 80.0000 mg | ORAL_TABLET | Freq: Every evening | ORAL | 1 refills | Status: DC

## 2018-04-12 MED ORDER — TAMSULOSIN HCL 0.4 MG PO CAPS: ORAL_CAPSULE | ORAL | 1 refills | Status: DC

## 2018-04-12 NOTE — Telephone Encounter (Signed)
Patient called stating he needs a refill on his Flomax and Provistatin. Thanks.

## 2018-04-12 NOTE — Telephone Encounter (Signed)
I have sent to patient;'s pharmacy.  

## 2018-04-13 ENCOUNTER — Ambulatory Visit (INDEPENDENT_AMBULATORY_CARE_PROVIDER_SITE_OTHER): Payer: Medicare Other | Admitting: Endocrinology

## 2018-04-13 ENCOUNTER — Encounter: Payer: Self-pay | Admitting: Endocrinology

## 2018-04-13 VITALS — BP 118/50 | HR 71 | Ht 68.0 in | Wt 224.6 lb

## 2018-04-13 DIAGNOSIS — I952 Hypotension due to drugs: Secondary | ICD-10-CM

## 2018-04-13 DIAGNOSIS — N289 Disorder of kidney and ureter, unspecified: Secondary | ICD-10-CM | POA: Diagnosis not present

## 2018-04-13 DIAGNOSIS — D508 Other iron deficiency anemias: Secondary | ICD-10-CM

## 2018-04-13 DIAGNOSIS — Z794 Long term (current) use of insulin: Secondary | ICD-10-CM

## 2018-04-13 DIAGNOSIS — E1165 Type 2 diabetes mellitus with hyperglycemia: Secondary | ICD-10-CM | POA: Diagnosis not present

## 2018-04-13 NOTE — Patient Instructions (Addendum)
Take 12 Humalog at supper  Cut Benicar out  Stop Glimeperide  Take 4 Humalog with oatmeal in am

## 2018-04-13 NOTE — Progress Notes (Signed)
Patient ID: Todd Romero, male   DOB: [birth year], 84 y.o.   MRN: [id number]    Reason for Appointment:   management of various chronic and new problems    History of Present Illness   Diagnosis: Type 2 DIABETES MELITUS, date of diagnosis:  1997   He has been on various regimens for his diabetes and has been on bedtime insulin with NPH since 2005 He also has benefited from adding Victoza in 2011 with better postprandial control and some weight loss Previously his weight has been as much as 247 pounds  On Victoza  since 11/14 with further improvement in blood sugar control. Since about 2015 his blood sugars have been overall mildly high with A1c over 7% consistently.  RECENT HISTORY:  His A1c is much higher at 8.2, previously was 6.9,   Non-insulin hypoglycemic drugs: Amaryl 1 mg in the a.m. and metformin 500 mg twice a day, Victoza 1.8 mg daily        Side effects from medications: None  Insulin regimen: NPH 26 units at bedtime and Humalog 10 units at suppertime    Current management, blood sugar patterns and problems identified:  Although he was started back on his Humalog on his last visit his blood sugars are significantly higher after evening meal frequently even though he is checking them about 3 hours after eating  His lab glucose was high after breakfast around 190 and he is not aware of any blood sugars after breakfast  He will sometimes eat oatmeal without protein or a wheat cereal, otherwise may have eggs and toast  His blood sugars are only slightly higher on average at home but A1c is higher than expected  He checks blood sugars only in the morning and bedtime  Fasting readings are however fairly good with only occasional high readings  He is taking Amaryl although this was recommended to be stopped on the last visit because of unlikely benefit  No hypoglycemia reported  He is doing fairly well with his diet with occasional increase in  carbohydrate intake at suppertime  Monitors blood glucose:about 2x a day.    Glucometer:  Accu-Chek         Blood Glucose readings from meter download:   Mean values apply above for all meters except median for One Touch  PRE-MEAL Fasting Lunch Dinner Bedtime Overall  Glucose range:  97-182    127-269  95-269  Mean/median:  129     164    Physical activity: exercise: Unable to do any activity   Meal times: Dinner about 5-7 PM  Wt Readings from Last 3 Encounters:  04/13/18 224 lb 9.6 oz (101.9 kg)  04/04/18 220 lb 14.4 oz (100.2 kg)  03/24/18 251 lb (113.9 kg)   LABS: Lab Results  Component Value Date   HGBA1C 8.2 (H) 04/07/2018   HGBA1C 6.9 (H) 02/10/2018   HGBA1C 7.5 (H) 10/31/2017   Lab Results  Component Value Date   MICROALBUR 22.8 (H) 10/31/2017   LDLCALC 58 04/07/2018   CREATININE 1.64 (H) 04/07/2018       OTHER active problems are discussed in review of systems    Allergies as of 04/13/2018      Reactions   Bactrim [sulfamethoxazole-trimethoprim] Nausea And Vomiting, Other (See Comments)   Bleeding and ulcers   Morphine And Related Nausea And Vomiting      Medication List        Accurate as of 04/13/18  1:34 PM. Always use  your most recent med list.          acetaminophen 500 MG tablet Commonly known as:  TYLENOL Take 500 mg by mouth every 4 (four) hours as needed for moderate pain or headache.   apixaban 5 MG Tabs tablet Commonly known as:  ELIQUIS Take 1 tablet (5 mg total) by mouth 2 (two) times daily.   ferrous sulfate 325 (65 FE) MG tablet Take 325 mg by mouth 2 (two) times daily with a meal.   fish oil-omega-3 fatty acids 1000 MG capsule Take 1 g by mouth daily at 12 noon.   furosemide 80 MG tablet Commonly known as:  LASIX Take 1 tablet (80 mg total) by mouth 2 (two) times daily. Take an extra half tablet (40 mg) if weight goes up 2 pounds or more from day before.   glimepiride 2 MG tablet Commonly known as:  AMARYL Take 0.5  tablets (1 mg total) by mouth daily.   HUMALOG 100 UNIT/ML injection Generic drug:  insulin lispro Inject 10 Units into the skin daily. Take with largest meal   insulin NPH Human 100 UNIT/ML injection Commonly known as:  HUMULIN N,NOVOLIN N Inject 0.26 mLs (26 Units total) into the skin at bedtime. Uses Vial   metFORMIN 500 MG (MOD) 24 hr tablet Commonly known as:  GLUMETZA Take 1 tablet (500 mg total) by mouth 2 (two) times daily with a meal. (morning & noon)   multivitamin with minerals Tabs tablet Take 1 tablet by mouth daily. Mens One a day Vit   olmesartan 40 MG tablet Commonly known as:  BENICAR Take 1 tablet (40 mg total) by mouth daily.   pantoprazole 40 MG tablet Commonly known as:  PROTONIX Take 1 tablet (40 mg total) by mouth 2 (two) times daily.   pravastatin 80 MG tablet Commonly known as:  PRAVACHOL Take 1 tablet (80 mg total) by mouth every evening.   PRESERVISION AREDS 2 PO Take 1 capsule by mouth 2 (two) times daily.   tamsulosin 0.4 MG Caps capsule Commonly known as:  FLOMAX Take 1 capsule daily   VICTOZA 18 MG/3ML Sopn Generic drug:  liraglutide INJECT 1.8 MG UNDER THE SKIN AT BEDTIME   vitamin C 500 MG tablet Commonly known as:  ASCORBIC ACID Take 500 mg by mouth daily.       Allergies:  Allergies  Allergen Reactions  . Bactrim [Sulfamethoxazole-Trimethoprim] Nausea And Vomiting and Other (See Comments)    Bleeding and ulcers  . Morphine And Related Nausea And Vomiting    Past Medical History:  Diagnosis Date  . Anemia   . Arthritis   . CAD (coronary artery disease)    a. cardiac cath 12/2016 showing moderate AS, elevated LVEDP, heavy 3V coronary calcification, 100% mD2, 30-50% LAD, 50-90% stenosis of Cx proximal to origin of L-PDA, RCA not engaged due to poor catheter control (difficult procedure) - coronary status was essentially unchanged from prior.  . Chronic diastolic CHF (congestive heart failure) ([place])   . Degenerative arthritis    . Diabetes ([place])   . Dyspnea   . Essential hypertension   . Hyperlipidemia   . Obesity (BMI 30-39.9) 06/18/2015  . OSA (obstructive sleep apnea)    Severe with AHI 27/hr now on CPAP  no cpap  . Peritonitis ([place]) 1985?  [name] Pneumonia    "6 months - 84 years old"  . Pure hypercholesterolemia   . RBBB   . [facility] spotted fever   . 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 Severe aortic stenosis    a. s/p TAVR 03/2017.  . Type II or unspecified type diabetes mellitus without mention of complication, not stated as uncontrolled     Past Surgical History:  Procedure Laterality Date  . APPENDECTOMY    . CARDIOVERSION N/A 04/03/2018   Procedure: CARDIOVERSION;  Surgeon: Josue Hector, MD;  Location: [facility] ENDOSCOPY;  Service: Cardiovascular;  Laterality: N/A;  . CARPAL TUNNEL RELEASE    . COLECTOMY     partial  . COLONOSCOPY WITH PROPOFOL N/A 11/18/2017   Procedure: COLONOSCOPY WITH PROPOFOL;  Surgeon: Carol Ada, MD;  Location: [facility] ENDOSCOPY;  Service: Endoscopy;  Laterality: N/A;  . ESOPHAGOGASTRODUODENOSCOPY N/A 11/18/2017   Procedure: ESOPHAGOGASTRODUODENOSCOPY (EGD);  Surgeon: Carol Ada, MD;  Location: [facility] ENDOSCOPY;  Service: Endoscopy;  Laterality: N/A;  . EYE SURGERY Bilateral    cataracts removed, lens placed  . JOINT REPLACEMENT    . KNEE CARTILAGE SURGERY    . PARTIAL COLECTOMY    . RIGHT/LEFT HEART CATH AND CORONARY ANGIOGRAPHY N/A 12/28/2016   Procedure: Right/Left Heart Cath and Coronary Angiography;  Surgeon: Belva Crome, MD;  Location: [place] CV LAB;  Service: Cardiovascular;  Laterality: N/A;  . TEE WITHOUT CARDIOVERSION N/A 03/15/2017   Procedure: TRANSESOPHAGEAL ECHOCARDIOGRAM (TEE);  Surgeon: Burnell Blanks, MD;  Location: [place];  Service: Open Heart Surgery;  Laterality: N/A;  . TOTAL KNEE ARTHROPLASTY Right 11/08/2017    . TOTAL KNEE ARTHROPLASTY Right 11/08/2017   Procedure: TOTAL KNEE ARTHROPLASTY;  Surgeon: Melrose Nakayama, MD;  Location: [place];  Service: Orthopedics;  Laterality: Right;  . TRANSCATHETER AORTIC VALVE REPLACEMENT, TRANSFEMORAL N/A 03/15/2017   Procedure: TRANSCATHETER AORTIC VALVE REPLACEMENT, TRANSFEMORAL;  Surgeon: Burnell Blanks, MD;  Location: [place];  Service: Open Heart Surgery;  Laterality: N/A;  . VEIN SURGERY      Family History  Problem Relation Age of Onset  . Heart failure Father   . Emphysema Father   . Hypertension Mother   . Cancer Sister   . Healthy Sister     Social History:  reports that he has never smoked. He has never used smokeless tobacco. He reports that he does not drink alcohol or use drugs.  Review of Systems:  HYPERTENSION:   Currently being treated with Benicar 40 mg and amlodipine was stopped in the hospital  He has checked blood pressure at home regularly His blood pressure has been as low as 80 systolic since he has been home from the hospital on increased diuretics At times he has felt blurred vision when he starts to stand up The last couple of days blood pressure her systolic sitting at home has been 100-117  BP Readings from Last 3 Encounters:  04/13/18 (!) 118/50  04/04/18 (!) 122/50  03/24/18 114/64    CARDIAC history:   No shortness of breath on exertion, this has not been a problem since his aortic valve replacement  EDEMA: He is admitted for CHF and had marked edema at the time of admission However he is back on 80 mg twice daily of the Lasix dose He is reporting to the cardiologist based on his weight  Cardiac arrhythmia: He says he had cardioversion in the hospital  HYPERLIPIDEMIA: The lipid abnormality consists of elevated LDL and he is taking 40 mg Pravachol,  LDL is improved Triglycerides have been normal  Lab Results  Component Value Date   CHOL 134 04/07/2018   HDL 51.80 04/07/2018   LDLCALC 58 04/07/2018    LDLDIRECT 101.0 04/26/2016   TRIG 124.0 04/07/2018   CHOLHDL 3 04/07/2018    DAYTIME somnolence:  Mild recently   IRON deficiency anemia:   His last iron saturation was low at 10.3 in June His hemoglobin tends to be mildly decreased also despite iron treatment This is stable more recently although relatively low when he was admitted last month He is usually trying to take his iron tablet regularly  No history of B-12 deficiency   CBC Latest Ref Rng & Units 04/07/2018 04/03/2018 03/27/2018  WBC 4.0 - 10.5 K/uL 6.8 7.7 7.7  Hemoglobin 13.0 - 17.0 g/dL 11.4(L) 11.7(L) 9.9(L)  Hematocrit 39.0 - 52.0 % 34.1(L) 36.7(L) 31.5(L)  Platelets 150.0 - 400.0 K/uL 144.0(L) 160 153   Foot exam last done in 04/2017  ROS    Physical Exam    BP (!) 118/50 (BP Location: Left Arm, Patient Position: Sitting, Cuff Size: Normal)   Pulse 71   Ht 5\' 8"  (1.727 m)   Wt 224 lb 9.6 oz (101.9 kg)   SpO2 98%   BMI 34.15 kg/m   He has 2+ right leg and 1+ left lower edema Lungs clear Heart sounds normal and rhythm is regular   ASSESSMENT/ PLAN:   Diabetes type 2 with obesity  See history of present illness for detailed discussion of his current management, blood sugar patterns and problems identified  He is on insulin and Victoza along with low-dose metformin  His A1c is much higher than usual at 8.2  More than likely he is having consistent postprandial hyperglycemia since his A1c is higher than usual and his blood sugars are averaging around 200 at night Also appears to be having high readings after breakfast as seen on his lab but he is not monitoring postprandial readings after breakfast or lunch Likely not benefiting from family Also continues to be on Victoza long-term His diet is usually fairly good and his wife is helping him plan his meals but carbohydrate intake can be variable He is not well aware of blood sugar targets after various meals  Recommendations: He will need to  check blood sugars after breakfast more often He does not need to check readings fasting consistently When not eating a protein like eggs in the morning he will take 4 units of Humalog at breakfast also Take at least 12 units of Humalog at supper and may adjust up or down 2 units based on the carbohydrate content of the meal Make sure he takes insulin before eating No change in NPH Stop glimepiride  HYPERTENSION: Blood pressure is significantly lower after having had more diuresis This is also causing renal dysfunction  Because of his orthostatic symptoms and worsening renal function he will stop his Benicar for now He and his wife are checking the blood pressure consistently at home and he will report any unusual readings He is also due to follow-up with cardiologist next week    EDEMA: This is better controlled.  Continue Lasix  ANEMIA: Hemoglobin just below 12 and he needs to continue iron long-term We will check iron saturation on the next visit also  Total visit time for evaluation and management of multiple problems and counseling =25 minutes   [name] 04/13/2018, 1:34 PM   Patient Instructions  Take 12 Humalog at supper  Cut Benicar out  Stop Glimeperide  Take 4  Humalog with oatmeal in am

## 2018-04-21 ENCOUNTER — Other Ambulatory Visit (HOSPITAL_COMMUNITY): Payer: Medicare Other

## 2018-04-21 ENCOUNTER — Ambulatory Visit (INDEPENDENT_AMBULATORY_CARE_PROVIDER_SITE_OTHER): Payer: Medicare Other | Admitting: Interventional Cardiology

## 2018-04-21 ENCOUNTER — Encounter: Payer: Self-pay | Admitting: Interventional Cardiology

## 2018-04-21 VITALS — BP 146/76 | HR 79 | Ht 68.0 in | Wt 223.8 lb

## 2018-04-21 DIAGNOSIS — I453 Trifascicular block: Secondary | ICD-10-CM | POA: Diagnosis not present

## 2018-04-21 DIAGNOSIS — I5042 Chronic combined systolic (congestive) and diastolic (congestive) heart failure: Secondary | ICD-10-CM

## 2018-04-21 DIAGNOSIS — I4891 Unspecified atrial fibrillation: Secondary | ICD-10-CM

## 2018-04-21 DIAGNOSIS — Z952 Presence of prosthetic heart valve: Secondary | ICD-10-CM | POA: Diagnosis not present

## 2018-04-21 DIAGNOSIS — I10 Essential (primary) hypertension: Secondary | ICD-10-CM

## 2018-04-21 MED ORDER — OLMESARTAN MEDOXOMIL 20 MG PO TABS: 20.0000 mg | ORAL_TABLET | Freq: Every day | ORAL | 3 refills | Status: DC

## 2018-04-21 NOTE — Progress Notes (Signed)
Cardiology Office Note:    Date:  04/21/2018   ID:  Todd Romero, DOB 10/10/1933, MRN [id number]  PCP:  [name], MD  Cardiologist:  Sinclair Grooms, MD   Referring MD: [name], MD   Chief Complaint  Patient presents with  . Atrial Fibrillation  . Congestive Heart Failure  . Hypertension    History of Present Illness:    Todd Romero is a 84 y.o. male with a hx of aortic stenosis now status post successful TAVR 04/8827, chronic diastolic heart failure, CAD with CTO of D1 and 90% LCX (cath 2013), OSA on CPAP, HTN, type 2 diabetes, and hyperlipidemia.  Recent atrial fibrillation with acute on chronic diastolic heart failure.  Developed atrial fibrillation with decompensation and heart failure requiring hospitalization in July with a 26 pound diuresis followed by electrical conversion.  Doing much better.  Since discharge from the hospital at a weight of around 220 pounds first thing each morning, weight has stabilized at about 218 pounds.  Breathing is markedly improved.  Swelling has not recurred.  He does not have any complaints of palpitations.  He did develop significant hypotension leading Dr. Dwyane Dee to discontinue Benicar 10 days ago.  He was mildly prerenal at the time.  He has no current complaints.  Blood pressure is mildly elevated today.  Past Medical History:  Diagnosis Date  . Anemia   . Arthritis   . CAD (coronary artery disease)    a. cardiac cath 12/2016 showing moderate AS, elevated LVEDP, heavy 3V coronary calcification, 100% mD2, 30-50% LAD, 50-90% stenosis of Cx proximal to origin of L-PDA, RCA not engaged due to poor catheter control (difficult procedure) - coronary status was essentially unchanged from prior.  . Chronic diastolic CHF (congestive heart failure) ([place])   . Degenerative arthritis   . Diabetes ([employer])   . Dyspnea   . Essential hypertension   . Hyperlipidemia   . Obesity (BMI 30-39.9) 06/18/2015  . OSA (obstructive sleep apnea)    Severe with AHI 27/hr now on CPAP  no cpap  . Peritonitis ([place]) 1985?  [name] Pneumonia    "6 months - 84 years old"  . Pure hypercholesterolemia   . RBBB   . [facility] spotted fever   . 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 Severe aortic stenosis    a. s/p TAVR 03/2017.  . Type II or unspecified type diabetes mellitus without mention of complication, not stated as uncontrolled     Past Surgical History:  Procedure Laterality Date  . APPENDECTOMY    . CARDIOVERSION N/A 04/03/2018   Procedure: CARDIOVERSION;  Surgeon: Josue Hector, MD;  Location: [facility] ENDOSCOPY;  Service: Cardiovascular;  Laterality: N/A;  . CARPAL TUNNEL RELEASE    . COLECTOMY     partial  . COLONOSCOPY WITH PROPOFOL N/A 11/18/2017   Procedure: COLONOSCOPY WITH PROPOFOL;  Surgeon: Carol Ada, MD;  Location: [facility] ENDOSCOPY;  Service: Endoscopy;  Laterality: N/A;  . ESOPHAGOGASTRODUODENOSCOPY N/A 11/18/2017   Procedure: ESOPHAGOGASTRODUODENOSCOPY (EGD);  Surgeon: Carol Ada, MD;  Location: [facility] ENDOSCOPY;  Service: Endoscopy;  Laterality: N/A;  . EYE SURGERY Bilateral    cataracts removed, lens placed  . JOINT REPLACEMENT    . KNEE CARTILAGE SURGERY    . PARTIAL COLECTOMY    . RIGHT/LEFT HEART CATH AND CORONARY ANGIOGRAPHY N/A 12/28/2016   Procedure: Right/Left Heart Cath  and Coronary Angiography;  Surgeon: Belva Crome, MD;  Location: [place] CV LAB;  Service: Cardiovascular;  Laterality: N/A;  . TEE WITHOUT CARDIOVERSION N/A 03/15/2017   Procedure: TRANSESOPHAGEAL ECHOCARDIOGRAM (TEE);  Surgeon: Burnell Blanks, MD;  Location: [place];  Service: Open Heart Surgery;  Laterality: N/A;  . TOTAL KNEE ARTHROPLASTY Right 11/08/2017  . TOTAL KNEE ARTHROPLASTY Right 11/08/2017   Procedure: TOTAL KNEE ARTHROPLASTY;  Surgeon: Melrose Nakayama, MD;  Location: [place];  Service: Orthopedics;   Laterality: Right;  . TRANSCATHETER AORTIC VALVE REPLACEMENT, TRANSFEMORAL N/A 03/15/2017   Procedure: TRANSCATHETER AORTIC VALVE REPLACEMENT, TRANSFEMORAL;  Surgeon: Burnell Blanks, MD;  Location: [place];  Service: Open Heart Surgery;  Laterality: N/A;  . VEIN SURGERY      Current Medications: Current Meds  Medication Sig  . acetaminophen (TYLENOL) 500 MG tablet Take 500 mg by mouth every 4 (four) hours as needed for moderate pain or headache.  [name] apixaban (ELIQUIS) 5 MG TABS tablet Take 1 tablet (5 mg total) by mouth 2 (two) times daily.  . ferrous sulfate 325 (65 FE) MG tablet Take 325 mg by mouth 2 (two) times daily with a meal.   . fish oil-omega-3 fatty acids 1000 MG capsule Take 1 g by mouth daily at 12 noon.   . furosemide (LASIX) 80 MG tablet Take 1 tablet (80 mg total) by mouth 2 (two) times daily. Take an extra half tablet (40 mg) if weight goes up 2 pounds or more from day before.  . insulin lispro (HUMALOG) 100 UNIT/ML injection Inject 10 Units into the skin daily. Take with largest meal  . insulin NPH Human (HUMULIN N,NOVOLIN N) 100 UNIT/ML injection Inject 0.26 mLs (26 Units total) into the skin at bedtime. Uses Vial (Patient taking differently: Inject 26 Units into the skin at bedtime. )  . metFORMIN (GLUMETZA) 500 MG (MOD) 24 hr tablet Take 1 tablet (500 mg total) by mouth 2 (two) times daily with a meal. (morning & noon)  . Multiple Vitamin (MULTIVITAMIN WITH MINERALS) TABS tablet Take 1 tablet by mouth daily. Mens One a day Vit  . Multiple Vitamins-Minerals (PRESERVISION AREDS 2 PO) Take 1 capsule by mouth 2 (two) times daily.  . pantoprazole (PROTONIX) 40 MG tablet Take 1 tablet (40 mg total) by mouth 2 (two) times daily.  . pravastatin (PRAVACHOL) 80 MG tablet Take 1 tablet (80 mg total) by mouth every evening.  . tamsulosin (FLOMAX) 0.4 MG CAPS capsule Take 1 capsule daily  . VICTOZA 18 MG/3ML SOPN INJECT 1.8 MG UNDER THE SKIN AT BEDTIME  . vitamin C (ASCORBIC ACID)  500 MG tablet Take 500 mg by mouth daily.     Allergies:   Bactrim [sulfamethoxazole-trimethoprim] and Morphine and related   Social History   Socioeconomic History  . Marital status: Married    Spouse name: Not on file  . Number of children: 4  . Years of education: Not on file  . Highest education level: Not on file  Occupational History  . Occupation: Construction-Retired  Social Needs  . Financial resource strain: Not on file  . Food insecurity:    Worry: Not on file    Inability: Not on file  . Transportation needs:    Medical: Not on file    Non-medical: Not on file  Tobacco Use  . Smoking status: Never Smoker  . Smokeless tobacco: Never Used  Substance and Sexual Activity  . Alcohol use: No  . Drug use: No  .  Sexual activity: Not on file  Lifestyle  . Physical activity:    Days per week: Not on file    Minutes per session: Not on file  . Stress: Not on file  Relationships  . Social connections:    Talks on phone: Not on file    Gets together: Not on file    Attends religious service: Not on file    Active member of club or organization: Not on file    Attends meetings of clubs or organizations: Not on file    Relationship status: Not on file  Other Topics Concern  . Not on file  Social History Narrative  . Not on file     Family History: The patient's family history includes Cancer in his sister; Emphysema in his father; Healthy in his sister; Heart failure in his father; Hypertension in his mother.  ROS:   Please see the history of present illness.    Voices no complaints other than dizziness when he stands.  Blood pressure was 85 mmHg systolic.  This led to a visit with Dr. Dwyane Dee who discontinued Benicar.  All other systems reviewed and are negative.  EKGs/Labs/Other Studies Reviewed:    The following studies were reviewed today: Recent hospital stay and laboratory data were evaluated.  EKG:  EKG is  ordered today.  The ekg ordered today  demonstrates sinus rhythm with significant first-degree AV block at 296 ms.  Left anterior hemiblock with right bundle branch block and therefore trifascicular block.  Recent Labs: 03/04/2018: B Natriuretic Peptide 261.9 03/24/2018: NT-Pro BNP 1,849 03/27/2018: Magnesium 2.2; TSH 0.862 04/07/2018: ALT 10; BUN 60; Creatinine, Ser 1.64; Hemoglobin 11.4; Platelets 144.0; Potassium 4.6; Sodium 133  Recent Lipid Panel    Component Value Date/Time   CHOL 134 04/07/2018 0910   TRIG 124.0 04/07/2018 0910   HDL 51.80 04/07/2018 0910   CHOLHDL 3 04/07/2018 0910   VLDL 24.8 04/07/2018 0910   LDLCALC 58 04/07/2018 0910   LDLDIRECT 101.0 04/26/2016 0915    Physical Exam:    VS:  BP (!) 146/76   Pulse 79   Ht '5\' 8"'$  (1.727 m)   Wt 223 lb 12.8 oz (101.5 kg)   BMI 34.03 kg/m     Wt Readings from Last 3 Encounters:  04/21/18 223 lb 12.8 oz (101.5 kg)  04/13/18 224 lb 9.6 oz (101.9 kg)  04/04/18 220 lb 14.4 oz (100.2 kg)     GEN:  Well nourished, well developed in no acute distress HEENT: Normal NECK: No JVD. LYMPHATICS: No lymphadenopathy CARDIAC: RRR, soft 1/6 systolic right upper sternal border murmur, S4 gallop, no edema. VASCULAR: 2+ radial pulses.  No bruits. RESPIRATORY:  Clear to auscultation without rales, wheezing or rhonchi  ABDOMEN: Soft, non-tender, non-distended, No pulsatile mass, MUSCULOSKELETAL: No deformity  SKIN: Warm and dry NEUROLOGIC:  Alert and oriented x 3 PSYCHIATRIC:  Normal affect   ASSESSMENT:    1. Atrial fibrillation, unspecified type ([place])   2. Chronic combined systolic and diastolic heart failure ([place])   3. S/P TAVR (transcatheter aortic valve replacement)   4. Trifascicular block   5. Essential hypertension, benign    PLAN:    In order of problems listed above:  1. Maintaining sinus rhythm but with significant first-degree AV block and right bundle with left axis deviation --> block.  At high risk for development of heart block and requiring  pacemaker.  Needs to be followed closely. 2. Resume Benicar but at 20 mg/day.  If it  causes systolic blood pressure to be 100 mmHg or less it is too low and they should call.  He should record blood pressure daily.  Basic metabolic panel in 7 to 10 days. 3. TAVR valve function appears normal at this time.  No clinical evidence of perivalvular leak. 4. If he develops recurrent atrial fibrillation he will require antiarrhythmic therapy.  Given trifascicular block and antiarrhythmic agent may push him to permanent pacemaker implantation to prevent excessive bradycardia. 5. Target blood pressure less than 130/80 mmHg but greater than 300 mmHg systolic.  APP in 4 weeks, be met in 7 to 10 days, Dr. Tamala Julian in 2 to 3 months.  Medication Adjustments/Labs and Tests Ordered: Current medicines are reviewed at length with the patient today.  Concerns regarding medicines are outlined above.  Orders Placed This Encounter  Procedures  . Basic metabolic panel  . EKG 12-Lead   Meds ordered this encounter  Medications  . olmesartan (BENICAR) 20 MG tablet    Sig: Take 1 tablet (20 mg total) by mouth daily.    Dispense:  90 tablet    Refill:  3    Patient Instructions  Medication Instructions:  1) START Olmesartan 65m once daily  Labwork: Your physician recommends that you return for lab work in: 1 week (BMET)   Testing/Procedures: None  Follow-Up: Your physician recommends that you schedule a follow-up appointment in: 4 weeks with a PA or NP.  Your physician recommends that you schedule a follow-up appointment in: 3 months with Dr. STamala Julian    Any Other Special Instructions Will Be Listed Below (If Applicable).  Monitor your blood pressure daily about 2-3 hours after your morning medication.  If consistently higher than 140/90 or if top number is consistently below 100, please contact the office with these recordings.    If you need a refill on your cardiac medications before your next  appointment, please call your pharmacy.      Signed, HSinclair Grooms MD  04/21/2018 1:02 PM    [facility]

## 2018-04-21 NOTE — Patient Instructions (Signed)
Medication Instructions:  1) START Olmesartan 20mg  once daily  Labwork: Your physician recommends that you return for lab work in: 1 week (BMET)   Testing/Procedures: None  Follow-Up: Your physician recommends that you schedule a follow-up appointment in: 4 weeks with a PA or NP.  Your physician recommends that you schedule a follow-up appointment in: 3 months with Dr. Tamala Julian.    Any Other Special Instructions Will Be Listed Below (If Applicable).  Monitor your blood pressure daily about 2-3 hours after your morning medication.  If consistently higher than 140/90 or if top number is consistently below 100, please contact the office with these recordings.    If you need a refill on your cardiac medications before your next appointment, please call your pharmacy.

## 2018-04-25 ENCOUNTER — Telehealth: Payer: Self-pay | Admitting: Interventional Cardiology

## 2018-04-25 ENCOUNTER — Other Ambulatory Visit: Payer: Self-pay | Admitting: Endocrinology

## 2018-04-25 NOTE — Telephone Encounter (Signed)
New Message:    Pt's diabetes is out of controlled, wife wants to know if pt can cut back on his Lasix please?

## 2018-04-25 NOTE — Telephone Encounter (Signed)
Spoke with Dr. Tamala Julian prior to calling and he said they need to try to get diabetes under control with medication because he does not want to back off of Lasix and have difficulties with CHF again.  Spoke with wife, DPR on file.  Wife states pt was wanting to know about backing off of Furosemide d/t hyperglycemia.  Advised wife of Dr. Thompson Caul recommendations. Wife verbalized understanding and will contact Dr. Dwyane Dee for medication adjustment.

## 2018-04-27 ENCOUNTER — Other Ambulatory Visit: Payer: Medicare Other | Admitting: *Deleted

## 2018-04-27 DIAGNOSIS — I4891 Unspecified atrial fibrillation: Secondary | ICD-10-CM

## 2018-04-27 DIAGNOSIS — I5042 Chronic combined systolic (congestive) and diastolic (congestive) heart failure: Secondary | ICD-10-CM

## 2018-04-27 LAB — BASIC METABOLIC PANEL
BUN / CREAT RATIO: 28 — AB (ref 10–24)
BUN: 39 mg/dL — AB (ref 8–27)
CALCIUM: 9.3 mg/dL (ref 8.6–10.2)
CO2: 22 mmol/L (ref 20–29)
Chloride: 99 mmol/L (ref 96–106)
Creatinine, Ser: 1.41 mg/dL — ABNORMAL HIGH (ref 0.76–1.27)
GFR, EST AFRICAN AMERICAN: 53 mL/min/{1.73_m2} — AB (ref >59)
GFR, EST NON AFRICAN AMERICAN: 46 mL/min/{1.73_m2} — AB (ref >59)
Glucose: 142 mg/dL — ABNORMAL HIGH (ref 65–99)
POTASSIUM: 5 mmol/L (ref 3.5–5.2)
Sodium: 137 mmol/L (ref 134–144)

## 2018-04-28 ENCOUNTER — Telehealth: Payer: Self-pay | Admitting: Endocrinology

## 2018-04-28 NOTE — Telephone Encounter (Signed)
Patient's wife called [phone] re: patient was in hospital-taking a lot of lasiks (160). Please call patient's wife at the above ph# to advise on patient's medication to see if anything (dosage) needs to be changed since patient's blood sugar numbers are now constantly over 200.

## 2018-05-01 NOTE — Telephone Encounter (Signed)
Please advise on previous note

## 2018-05-01 NOTE — Telephone Encounter (Signed)
Spoke with patient to inform of lab results.  He asked if Dr. Tamala Julian would consider lowering lasix dose because his sugars are staying higher than normal and he has recently increased Humalog but still not coming back down.  Per patient his weight is stable at 218. Denies SOB.  Swelling is "way down, back to normal".  Pt aware that I will forward his request to Dr. Tamala Julian and his primary nurse to follow up on his request.

## 2018-05-01 NOTE — Telephone Encounter (Signed)
Gave patient MD instructions and he stated an understanding

## 2018-05-01 NOTE — Telephone Encounter (Signed)
Spoke with the patients wife and she will have the patient call me back with blood sugar readings

## 2018-05-01 NOTE — Telephone Encounter (Signed)
He can go back to taking glimepiride 1 tablet twice a day but also increase his NPH insulin up to 30 units at night and take the additional 2 to 4 units at suppertime a Humalog

## 2018-05-01 NOTE — Telephone Encounter (Signed)
Patient called back to give blood sugar readings for the last 3 days and they are: Friday-200 before breakfast            273 after dinner Saturday- 150 before breakfast                  175 after dinner Sunday- 191 before breakfast                18 6 after dinner This morning- 174 before breakfast   Please advise

## 2018-05-01 NOTE — Telephone Encounter (Signed)
Please get the blood sugar readings by the time of the day for the last 3 days

## 2018-05-01 NOTE — Telephone Encounter (Signed)
Follow up:   Patient returning call about results.

## 2018-05-02 ENCOUNTER — Telehealth: Payer: Self-pay | Admitting: Endocrinology

## 2018-05-02 ENCOUNTER — Other Ambulatory Visit: Payer: Self-pay

## 2018-05-02 MED ORDER — GLIMEPIRIDE 2 MG PO TABS: ORAL_TABLET | ORAL | 4 refills | Status: DC

## 2018-05-02 NOTE — Telephone Encounter (Signed)
Is this going to be 2 mg tablets please advise

## 2018-05-02 NOTE — Telephone Encounter (Signed)
Yes he will be taking 2 mg twice a day

## 2018-05-02 NOTE — Telephone Encounter (Signed)
Patient requests RX for Glimepride (dosage was upped from 1/2 per day to 2 pills per day, so he is using much more) sent to [facility] asap.

## 2018-05-02 NOTE — Telephone Encounter (Signed)
Sent prescription in a LVM letting patient know

## 2018-05-03 ENCOUNTER — Other Ambulatory Visit: Payer: Self-pay

## 2018-05-03 MED ORDER — INSULIN PEN NEEDLE 31G X 5 MM MISC: 3 refills | Status: DC

## 2018-05-03 NOTE — Telephone Encounter (Signed)
Since we are treating CHF, is there a chance that an SGLT-2 can be added to his regimen for diabetes? This may allow a decrease in diuretic intensity.

## 2018-05-03 NOTE — Telephone Encounter (Signed)
Unfortunately because of his renal insufficiency we cannot do this even though it would be an excellent idea

## 2018-05-04 NOTE — Telephone Encounter (Signed)
Got it!     Thanks =).

## 2018-05-09 LAB — HM DIABETES EYE EXAM

## 2018-05-11 ENCOUNTER — Other Ambulatory Visit: Payer: Self-pay | Admitting: Endocrinology

## 2018-05-11 MED ORDER — PANTOPRAZOLE SODIUM 40 MG PO TBEC: 40.0000 mg | DELAYED_RELEASE_TABLET | Freq: Two times a day (BID) | ORAL | 0 refills | Status: DC

## 2018-05-15 ENCOUNTER — Other Ambulatory Visit: Payer: Medicare Other

## 2018-05-18 ENCOUNTER — Ambulatory Visit: Payer: Medicare Other | Admitting: Endocrinology

## 2018-05-24 NOTE — Progress Notes (Signed)
Cardiology Office Note   Date:  05/27/2018   ID:  Todd Romero, DOB 1934-03-30, MRN [id number]  PCP:  [name], MD  Cardiologist:  Dr. Tamala Julian    Chief Complaint  Patient presents with  . Hypotension      History of Present Illness: Todd Romero is a 84 y.o. male who presents for hypotension, medication adjustment.  He has a hx of aortic stenosisnow status post successful TAVR 12/812, chronic diastolic heart failure, CAD with CTO of D1 and 90% LCX (cath 2013), OSA on CPAP, HTN, type 2 diabetes, and hyperlipidemia.  Recent atrial fibrillation with acute on chronic diastolic heart failure.  Developed atrial fibrillation with decompensation and heart failure requiring hospitalization in July with a 26 pound diuresis followed by electrical conversion.  Recent hypotension with stopping benicar.  On last visit with Dr. Tamala Julian BP was elevated and benicar was resumed at lower dose.  Back for followu p  Goal 130/80 but greater thatn 100  TAVR stable.  On last echo.  Today no complaints no chest pain or SOB.  Only occ lightheadedness, he feels much better.  His glucose is stable.  He continues with eliquis.  His wife is with him today..     Past Medical History:  Diagnosis Date  . Anemia   . Arthritis   . CAD (coronary artery disease)    a. cardiac cath 12/2016 showing moderate AS, elevated LVEDP, heavy 3V coronary calcification, 100% mD2, 30-50% LAD, 50-90% stenosis of Cx proximal to origin of L-PDA, RCA not engaged due to poor catheter control (difficult procedure) - coronary status was essentially unchanged from prior.  . Chronic diastolic CHF (congestive heart failure) ([place])   . Degenerative arthritis   . Diabetes ([place])   . Dyspnea   . Essential hypertension   . Hyperlipidemia   . Obesity (BMI 30-39.9) 06/18/2015  . OSA (obstructive sleep apnea)    Severe with AHI 27/hr now on CPAP  no cpap  . Peritonitis ([place]) 1985?  [name] Pneumonia    "6 months - 84 years old"    . Pure hypercholesterolemia   . RBBB   . [facility] spotted fever   . 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 Severe aortic stenosis    a. s/p TAVR 03/2017.  . Type II or unspecified type diabetes mellitus without mention of complication, not stated as uncontrolled     Past Surgical History:  Procedure Laterality Date  . APPENDECTOMY    . CARDIOVERSION N/A 04/03/2018   Procedure: CARDIOVERSION;  Surgeon: Josue Hector, MD;  Location: [facility] ENDOSCOPY;  Service: Cardiovascular;  Laterality: N/A;  . CARPAL TUNNEL RELEASE    . COLECTOMY     partial  . COLONOSCOPY WITH PROPOFOL N/A 11/18/2017   Procedure: COLONOSCOPY WITH PROPOFOL;  Surgeon: Carol Ada, MD;  Location: [facility] ENDOSCOPY;  Service: Endoscopy;  Laterality: N/A;  . ESOPHAGOGASTRODUODENOSCOPY N/A 11/18/2017   Procedure: ESOPHAGOGASTRODUODENOSCOPY (EGD);  Surgeon: Carol Ada, MD;  Location: [facility] ENDOSCOPY;  Service: Endoscopy;  Laterality: N/A;  . EYE SURGERY Bilateral    cataracts removed, lens placed  . JOINT REPLACEMENT    . KNEE CARTILAGE SURGERY    . PARTIAL COLECTOMY    . RIGHT/LEFT HEART CATH AND CORONARY ANGIOGRAPHY N/A 12/28/2016   Procedure: Right/Left Heart Cath and Coronary Angiography;  Surgeon: Belva Crome, MD;  Location: [place] CV  LAB;  Service: Cardiovascular;  Laterality: N/A;  . TEE WITHOUT CARDIOVERSION N/A 03/15/2017   Procedure: TRANSESOPHAGEAL ECHOCARDIOGRAM (TEE);  Surgeon: Burnell Blanks, MD;  Location: [place];  Service: Open Heart Surgery;  Laterality: N/A;  . TOTAL KNEE ARTHROPLASTY Right 11/08/2017  . TOTAL KNEE ARTHROPLASTY Right 11/08/2017   Procedure: TOTAL KNEE ARTHROPLASTY;  Surgeon: Melrose Nakayama, MD;  Location: [place];  Service: Orthopedics;  Laterality: Right;  . TRANSCATHETER AORTIC VALVE REPLACEMENT, TRANSFEMORAL N/A 03/15/2017   Procedure: TRANSCATHETER  AORTIC VALVE REPLACEMENT, TRANSFEMORAL;  Surgeon: Burnell Blanks, MD;  Location: [place];  Service: Open Heart Surgery;  Laterality: N/A;  . VEIN SURGERY       Current Outpatient Medications  Medication Sig Dispense Refill  . ACCU-CHEK AVIVA PLUS test strip USE AS INSTRUCTED TO CHECK BLOOD SUGAR TWICE A DAY 200 each 1  . acetaminophen (TYLENOL) 500 MG tablet Take 500 mg by mouth every 4 (four) hours as needed for moderate pain or headache.    [name] apixaban (ELIQUIS) 5 MG TABS tablet Take 1 tablet (5 mg total) by mouth 2 (two) times daily. 60 tablet 2  . ferrous sulfate 325 (65 FE) MG tablet Take 325 mg by mouth 2 (two) times daily with a meal.     . fish oil-omega-3 fatty acids 1000 MG capsule Take 1 g by mouth daily at 12 noon.     . furosemide (LASIX) 80 MG tablet Take 1 tablet (80 mg total) by mouth 2 (two) times daily. Take an extra half tablet (40 mg) if weight goes up 2 pounds or more from day before. 60 tablet 11  . glimepiride (AMARYL) 2 MG tablet Take one tablet twice daily 180 tablet 4  . insulin lispro (HUMALOG KWIKPEN) 100 UNIT/ML KiwkPen INJECT 4-8 UNITS BEFORE THE MAIN MEALS OF THE DAY 15 pen 1  . insulin lispro (HUMALOG) 100 UNIT/ML injection Inject into the skin as directed.    . Insulin Pen Needle (B-D UF III MINI PEN NEEDLES) 31G X 5 MM MISC USE 2 PEN NEEDLE PER DAY WITH HUMALOG AND VICTOZA 200 each 3  . metFORMIN (GLUMETZA) 500 MG (MOD) 24 hr tablet Take 1 tablet (500 mg total) by mouth 2 (two) times daily with a meal. (morning & noon) 180 tablet 4  . Multiple Vitamin (MULTIVITAMIN WITH MINERALS) TABS tablet Take 1 tablet by mouth daily. Mens One a day Vit    . Multiple Vitamins-Minerals (PRESERVISION AREDS 2 PO) Take 1 capsule by mouth 2 (two) times daily.    [name] olmesartan (BENICAR) 20 MG tablet Take 1 tablet (20 mg total) by mouth daily. 90 tablet 3  . pantoprazole (PROTONIX) 40 MG tablet Take 1 tablet (40 mg total) by mouth 2 (two) times daily. 60 tablet 0  .  pravastatin (PRAVACHOL) 80 MG tablet Take 1 tablet (80 mg total) by mouth every evening. 90 tablet 1  . tamsulosin (FLOMAX) 0.4 MG CAPS capsule Take 1 capsule daily 90 capsule 1  . VICTOZA 18 MG/3ML SOPN INJECT 1.8 MG UNDER THE SKIN AT BEDTIME 9 pen 2  . vitamin C (ASCORBIC ACID) 500 MG tablet Take 500 mg by mouth daily.     No current facility-administered medications for this visit.     Allergies:   Bactrim [sulfamethoxazole-trimethoprim] and Morphine and related    Social History:  The patient  reports that he has never smoked. He has never used smokeless tobacco. He reports that he does not drink alcohol or use drugs.  Family History:  The patient's family history includes Cancer in his sister; Emphysema in his father; Healthy in his sister; Heart failure in his father; Hypertension in his mother.    ROS:  General:no colds or fevers, no weight changes CV:see HPI PUL:see HPI GI:no diarrhea constipation or melena, no indigestion GU:no hematuria, no dysuria MS:no joint pain, no claudication Neuro:no syncope, no lightheadedness Endo:+ diabetes, no thyroid disease  Wt Readings from Last 3 Encounters:  05/25/18 231 lb (104.8 kg)  04/21/18 223 lb 12.8 oz (101.5 kg)  04/13/18 224 lb 9.6 oz (101.9 kg)     PHYSICAL EXAM: VS:  BP 122/68   Pulse 84   Ht 5\' 8"  (1.727 m)   Wt 231 lb (104.8 kg)   SpO2 98%   BMI 35.12 kg/m  , BMI Body mass index is 35.12 kg/m. General:Pleasant affect, NAD Neck:supple, no JVD, no bruits  Heart:S1S2 RRR with soft systolic murmur, no gallup, rub or click Lungs:clear without rales, rhonchi, or wheezes ZYS:AYTK, non tender, + BS, do not palpate liver spleen or masses Ext:1+ lower ext edema, 2+ pedal pulses, 2+ radial pulses Neuro:alert and oriented X 3, MAE, follows commands, + facial symmetry    EKG:  EKG is ordered today. The ekg ordered today demonstrates SR at 68 RBBBand no acute changes   Recent Labs: 03/04/2018: B Natriuretic Peptide  261.9 03/24/2018: NT-Pro BNP 1,849 03/27/2018: Magnesium 2.2; TSH 0.862 04/07/2018: ALT 10; Hemoglobin 11.4; Platelets 144.0 04/27/2018: BUN 39; Creatinine, Ser 1.41; Potassium 5.0; Sodium 137    Lipid Panel    Component Value Date/Time   CHOL 134 04/07/2018 0910   TRIG 124.0 04/07/2018 0910   HDL 51.80 04/07/2018 0910   CHOLHDL 3 04/07/2018 0910   VLDL 24.8 04/07/2018 0910   LDLCALC 58 04/07/2018 0910   LDLDIRECT 101.0 04/26/2016 0915       Other studies Reviewed: Additional studies/ records that were reviewed today include: . TTE 03/05/18 Study Conclusions  - Left ventricle: The cavity size was normal. Wall thickness was   increased in a pattern of moderate LVH. The estimated ejection   fraction was 43%. Diffuse hypokinesis. The study is not   technically sufficient to allow evaluation of LV diastolic   function. Ejection fraction (MOD, 2-plane): 43%. - Aortic valve: s/p TAVR valve. No obstruction. Trivial   paravalvular leak. Mean gradient (S): 10 mm Hg. Peak gradient   (S): 24 mm Hg. Valve area (VTI): 1.18 cm^2. Valve area (Vmax):   1.09 cm^2. Valve area (Vmean): 1.2 cm^2. - Left atrium: The atrium was mildly dilated. - Right ventricle: The cavity size was mildly dilated. - Atrial septum: Small atrial septal defect. - Tricuspid valve: There was moderate regurgitation. - Pulmonary arteries: PA peak pressure: 47 mm Hg (S). - Inferior vena cava: The vessel was dilated. The respirophasic   diameter changes were blunted (< 50%), consistent with elevated   central venous pressure.  Impressions:  - Compared to a prior study in 04/2017, the LVEF is lower at 43%   with global hypokinesis. The TAVR valve is non-obstructed with   trivial perivalvular leak and has a mean gradient of 10 mmHg. A   small atrial septal defect is noted. There is moderate TR with an   RVSP of 47 mmHg and a dilated IVC.   ASSESSMENT AND PLAN:  1.  Hypotension improved and tolerating lower dose of  ARB.  Continue follow up with Dr. Tamala Julian  In Nov  2.  Persistent a fib maintaining  SR with RBBB and LAD.  Discussed symptoms of slow HR and he shoudl call if this occurs.  Per Dr. Tamala Julian  "If he develops recurrent atrial fibrillation he will require antiarrhythmic therapy.  Given trifascicular block and antiarrhythmic agent may push him to permanent pacemaker implantation to prevent excessive bradycardia."  2.  Hx AS with TRAVR stable.   Current medicines are reviewed with the patient today.  The patient Has no concerns regarding medicines.  The following changes have been made:  See above Labs/ tests ordered today include:see above  Disposition:   FU:  see above  Signed, Cecilie Kicks, NP  05/27/2018 8:59 PM    [facility] [place], [place] [place], [place] Phone: [phone]; Fax: [phone]

## 2018-05-25 ENCOUNTER — Encounter: Payer: Self-pay | Admitting: Cardiology

## 2018-05-25 ENCOUNTER — Ambulatory Visit (INDEPENDENT_AMBULATORY_CARE_PROVIDER_SITE_OTHER): Payer: Medicare Other | Admitting: Cardiology

## 2018-05-25 VITALS — BP 122/68 | HR 84 | Ht 68.0 in | Wt 231.0 lb

## 2018-05-25 DIAGNOSIS — I10 Essential (primary) hypertension: Secondary | ICD-10-CM

## 2018-05-25 DIAGNOSIS — I952 Hypotension due to drugs: Secondary | ICD-10-CM | POA: Diagnosis not present

## 2018-05-25 DIAGNOSIS — I4891 Unspecified atrial fibrillation: Secondary | ICD-10-CM

## 2018-05-25 DIAGNOSIS — I5042 Chronic combined systolic (congestive) and diastolic (congestive) heart failure: Secondary | ICD-10-CM | POA: Diagnosis not present

## 2018-05-25 NOTE — Patient Instructions (Addendum)
Medication Instructions:  Your physician recommends that you continue on your current medications as directed. Please refer to the Current Medication list given to you today.   Labwork: None ordered  Testing/Procedures: None ordered  Follow-Up: Your physician recommends that you schedule a follow-up appointment in: [place] AS PLANNED   Any Other Special Instructions Will Be Listed Below (If Applicable).     If you need a refill on your cardiac medications before your next appointment, please call your pharmacy.

## 2018-05-27 ENCOUNTER — Encounter: Payer: Self-pay | Admitting: Cardiology

## 2018-05-28 ENCOUNTER — Other Ambulatory Visit: Payer: Self-pay | Admitting: Cardiology

## 2018-05-29 NOTE — Telephone Encounter (Signed)
Eliquis 5mg  refill request received; pt is 84 yrs old, wt-104.8kg, Crea-1.41 on 04/27/18, last seen by Cecilie Kicks on 05/25/18; will send in refill to requested pharmacy.

## 2018-06-06 ENCOUNTER — Other Ambulatory Visit: Payer: Self-pay | Admitting: Cardiology

## 2018-06-06 ENCOUNTER — Other Ambulatory Visit (INDEPENDENT_AMBULATORY_CARE_PROVIDER_SITE_OTHER): Payer: Medicare Other

## 2018-06-06 DIAGNOSIS — D508 Other iron deficiency anemias: Secondary | ICD-10-CM

## 2018-06-06 DIAGNOSIS — E1165 Type 2 diabetes mellitus with hyperglycemia: Secondary | ICD-10-CM

## 2018-06-06 DIAGNOSIS — Z794 Long term (current) use of insulin: Secondary | ICD-10-CM | POA: Diagnosis not present

## 2018-06-06 LAB — BASIC METABOLIC PANEL
BUN: 51 mg/dL — AB (ref 6–23)
CHLORIDE: 103 meq/L (ref 96–112)
CO2: 25 mEq/L (ref 19–32)
Calcium: 9 mg/dL (ref 8.4–10.5)
Creatinine, Ser: 1.65 mg/dL — ABNORMAL HIGH (ref 0.40–1.50)
GFR: 42.64 mL/min — AB (ref >60.00)
Glucose, Bld: 172 mg/dL — ABNORMAL HIGH (ref 70–99)
POTASSIUM: 4.4 meq/L (ref 3.5–5.1)
SODIUM: 137 meq/L (ref 135–145)

## 2018-06-06 LAB — CBC
HCT: 31.7 % — ABNORMAL LOW (ref 39.0–52.0)
Hemoglobin: 10.5 g/dL — ABNORMAL LOW (ref 13.0–17.0)
MCHC: 33.2 g/dL (ref 30.0–36.0)
MCV: 89.3 fl (ref 78.0–100.0)
Platelets: 151 10*3/uL (ref 150.0–400.0)
RBC: 3.55 Mil/uL — ABNORMAL LOW (ref 4.22–5.81)
RDW: 17.3 % — AB (ref 11.5–15.5)
WBC: 7 10*3/uL (ref 4.0–10.5)

## 2018-06-06 LAB — IBC PANEL
Iron: 56 ug/dL (ref 42–165)
SATURATION RATIOS: 16.7 % — AB (ref 20.0–50.0)
TRANSFERRIN: 239 mg/dL (ref 212.0–360.0)

## 2018-06-07 ENCOUNTER — Other Ambulatory Visit: Payer: Self-pay | Admitting: Endocrinology

## 2018-06-07 LAB — FRUCTOSAMINE: FRUCTOSAMINE: 279 umol/L (ref 0–285)

## 2018-06-09 ENCOUNTER — Ambulatory Visit (INDEPENDENT_AMBULATORY_CARE_PROVIDER_SITE_OTHER): Payer: Medicare Other | Admitting: Endocrinology

## 2018-06-09 ENCOUNTER — Other Ambulatory Visit: Payer: Self-pay

## 2018-06-09 ENCOUNTER — Encounter: Payer: Self-pay | Admitting: Endocrinology

## 2018-06-09 VITALS — BP 122/68 | HR 70 | Ht 68.0 in | Wt 233.0 lb

## 2018-06-09 DIAGNOSIS — Z23 Encounter for immunization: Secondary | ICD-10-CM | POA: Diagnosis not present

## 2018-06-09 DIAGNOSIS — D508 Other iron deficiency anemias: Secondary | ICD-10-CM

## 2018-06-09 DIAGNOSIS — N289 Disorder of kidney and ureter, unspecified: Secondary | ICD-10-CM | POA: Diagnosis not present

## 2018-06-09 DIAGNOSIS — E1165 Type 2 diabetes mellitus with hyperglycemia: Secondary | ICD-10-CM

## 2018-06-09 DIAGNOSIS — D539 Nutritional anemia, unspecified: Secondary | ICD-10-CM

## 2018-06-09 DIAGNOSIS — Z794 Long term (current) use of insulin: Secondary | ICD-10-CM

## 2018-06-09 MED ORDER — METFORMIN HCL ER (MOD) 500 MG PO TB24: 500.0000 mg | ORAL_TABLET | Freq: Two times a day (BID) | ORAL | 4 refills | Status: DC

## 2018-06-09 NOTE — Patient Instructions (Addendum)
28 N at bedtime  10-12 Humalog before supper  Check blood sugars on waking up days a week  Also check blood sugars about 2 hours after meals and do this after different meals by rotation  Recommended blood sugar levels on waking up are 90-130 and about 2 hours after meal is 130-160  Please bring your blood sugar monitor to each visit, thank you

## 2018-06-09 NOTE — Progress Notes (Signed)
Patient ID: Todd Romero, male   DOB: 22-Aug-1934, 84 y.o.   MRN: [id number]    Reason for Appointment:   management of various problems    History of Present Illness   Diagnosis: Type 2 DIABETES MELITUS, date of diagnosis:  1997   He has been on various regimens for his diabetes and has been on bedtime insulin with NPH since 2005 He also has benefited from adding Victoza in 2011 with better postprandial control and some weight loss Previously his weight has been as much as 247 pounds  On Victoza  since 11/14 with further improvement in blood sugar control. Since about 2015 his blood sugars have been overall mildly high with A1c over 7% consistently.  RECENT HISTORY:  His A1c in 8/19 is much higher at 8.2, previously was 6.9 Now fructosamine is 279  Non-insulin hypoglycemic drugs: Amaryl 2 mg twice daily. and metformin 500 mg twice a day, Victoza 1.8 mg daily        Side effects from medications: None  Insulin regimen: NPH 30 units at bedtime and Humalog 8-10 units at suppertime    Current management, blood sugar patterns and problems identified:  On his last visit he was told to increase his insulin doses but he called back to say his blood sugar go higher  This may have been from stopping Amaryl which was restarted and increased to 2 mg twice daily  Also he was told to increase his Humalog consistently at dinnertime but he is only taking small doses  Not sure if his blood sugars are higher even before dinner since he does not check in the afternoon  Does not increase his Humalog based on meal size or carbohydrate content of DINNER meal causing the blood sugar to be variable after supper  Also cannot explain a reading of 357 after dinner once took extra Humalog  Only once has had hypoglycemia at 1 AM and not clear why  August fasting blood sugars are much better but may be as low as 79 now, no nocturnal hypoglycemic symptoms  Still not able to be active  because of knee pain and other issues  Metformin has been reduced because of renal impairment  Monitors blood glucose:about 2x a day.    Glucometer:  Accu-Chek         Blood Glucose readings from meter download:    PRE-MEAL Fasting Lunch Dinner Bedtime Overall  Glucose range:  79-159      Mean/median:  109     149   POST-MEAL PC Breakfast PC Lunch PC Dinner  Glucose range:    122-357  Mean/median:    189   Previous readings  PRE-MEAL Fasting Lunch Dinner Bedtime Overall  Glucose range:  97-182    127-269  95-269  Mean/median:  129     164    Physical activity: exercise: Unable to do any  Meal times: Dinner about 5-7 PM  Wt Readings from Last 3 Encounters:  06/09/18 233 lb (105.7 kg)  05/25/18 231 lb (104.8 kg)  04/21/18 223 lb 12.8 oz (101.5 kg)   LABS: Lab Results  Component Value Date   HGBA1C 8.2 (H) 04/07/2018   HGBA1C 6.9 (H) 02/10/2018   HGBA1C 7.5 (H) 10/31/2017   Lab Results  Component Value Date   MICROALBUR 22.8 (H) 10/31/2017   LDLCALC 58 04/07/2018   CREATININE 1.65 (H) 06/06/2018       OTHER active problems are discussed in review of systems  Allergies as of 06/09/2018      Reactions   Bactrim [sulfamethoxazole-trimethoprim] Nausea And Vomiting, Other (See Comments)   Bleeding and ulcers   Morphine And Related Nausea And Vomiting      Medication List        Accurate as of 06/09/18 11:59 PM. Always use your most recent med list.          ACCU-CHEK AVIVA PLUS test strip Generic drug:  glucose blood USE AS INSTRUCTED TO CHECK BLOOD SUGAR TWICE A DAY   acetaminophen 500 MG tablet Commonly known as:  TYLENOL Take 500 mg by mouth every 4 (four) hours as needed for moderate pain or headache.   ELIQUIS 5 MG Tabs tablet Generic drug:  apixaban TAKE 1 TABLET BY MOUTH TWICE A DAY   ferrous sulfate 325 (65 FE) MG tablet Take 325 mg by mouth 2 (two) times daily with a meal.   fish oil-omega-3 fatty acids 1000 MG capsule Take 1 g by  mouth daily at 12 noon.   furosemide 80 MG tablet Commonly known as:  LASIX Take 1 tablet (80 mg total) by mouth 2 (two) times daily. Take an extra half tablet (40 mg) if weight goes up 2 pounds or more from day before.   glimepiride 2 MG tablet Commonly known as:  AMARYL Take one tablet twice daily   HUMALOG 100 UNIT/ML injection Generic drug:  insulin lispro Inject into the skin as directed.   insulin lispro 100 UNIT/ML KiwkPen Commonly known as:  HUMALOG INJECT 4-8 UNITS BEFORE THE MAIN MEALS OF THE DAY   Insulin Pen Needle 31G X 5 MM Misc USE 2 PEN NEEDLE PER DAY WITH HUMALOG AND VICTOZA   metFORMIN 500 MG (MOD) 24 hr tablet Commonly known as:  GLUMETZA Take 1 tablet (500 mg total) by mouth 2 (two) times daily with a meal. (morning & noon)   multivitamin with minerals Tabs tablet Take 1 tablet by mouth daily. Mens One a day Vit   olmesartan 20 MG tablet Commonly known as:  BENICAR Take 1 tablet (20 mg total) by mouth daily.   pantoprazole 40 MG tablet Commonly known as:  PROTONIX TAKE 1 TABLET BY MOUTH TWICE A DAY   pravastatin 80 MG tablet Commonly known as:  PRAVACHOL Take 1 tablet (80 mg total) by mouth every evening.   PRESERVISION AREDS 2 PO Take 1 capsule by mouth 2 (two) times daily.   tamsulosin 0.4 MG Caps capsule Commonly known as:  FLOMAX Take 1 capsule daily   VICTOZA 18 MG/3ML Sopn Generic drug:  liraglutide INJECT 1.8 MG UNDER THE SKIN AT BEDTIME   vitamin C 500 MG tablet Commonly known as:  ASCORBIC ACID Take 500 mg by mouth daily.       Allergies:  Allergies  Allergen Reactions  . Bactrim [Sulfamethoxazole-Trimethoprim] Nausea And Vomiting and Other (See Comments)    Bleeding and ulcers  . Morphine And Related Nausea And Vomiting    Past Medical History:  Diagnosis Date  . Anemia   . Arthritis   . CAD (coronary artery disease)    a. cardiac cath 12/2016 showing moderate AS, elevated LVEDP, heavy 3V coronary calcification, 100%  mD2, 30-50% LAD, 50-90% stenosis of Cx proximal to origin of L-PDA, RCA not engaged due to poor catheter control (difficult procedure) - coronary status was essentially unchanged from prior.  . Chronic diastolic CHF (congestive heart failure) ([place])   . Degenerative arthritis   . Diabetes ([place])   . Dyspnea   .  Essential hypertension   . Hyperlipidemia   . Obesity (BMI 30-39.9) 06/18/2015  . OSA (obstructive sleep apnea)    Severe with AHI 27/hr now on CPAP  no cpap  . Peritonitis ([place]) 1985?  [name] Pneumonia    "6 months - 84 years old"  . Pure hypercholesterolemia   . RBBB   . [facility] spotted fever   . 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 Severe aortic stenosis    a. s/p TAVR 03/2017.  . Type II or unspecified type diabetes mellitus without mention of complication, not stated as uncontrolled     Past Surgical History:  Procedure Laterality Date  . APPENDECTOMY    . CARDIOVERSION N/A 04/03/2018   Procedure: CARDIOVERSION;  Surgeon: Josue Hector, MD;  Location: [facility] END[facility];  Service: Cardiovascular;  Laterality: N/A;  . CARPAL TUNNEL RELEASE    . COLECTOMY     partial  . COLONOSCOPY WITH PROPOFOL N/A 11/18/2017   Procedure: COLONOSCOPY WITH PROPOFOL;  Surgeon: Carol Ada, MD;  Location: [facility] ENDOSCOPY;  Service: Endoscopy;  Laterality: N/A;  . ESOPHAGOGASTRODUODENOSCOPY N/A 11/18/2017   Procedure: ESOPHAGOGASTRODUODENOSCOPY (EGD);  Surgeon: Carol Ada, MD;  Location: [facility];  Service: Endoscopy;  Laterality: N/A;  . EYE SURGERY Bilateral    cataracts removed, lens placed  . JOINT REPLACEMENT    . KNEE CARTILAGE SURGERY    . PARTIAL COLECTOMY    . RIGHT/LEFT HEART CATH AND CORONARY ANGIOGRAPHY N/A 12/28/2016   Procedure: Right/Left Heart Cath and Coronary Angiography;  Surgeon: Belva Crome, MD;  Location: [place] CV LAB;  Service:  Cardiovascular;  Laterality: N/A;  . TEE WITHOUT CARDIOVERSION N/A 03/15/2017   Procedure: TRANSESOPHAGEAL ECHOCARDIOGRAM (TEE);  Surgeon: Burnell Blanks, MD;  Location: [place];  Service: Open Heart Surgery;  Laterality: N/A;  . TOTAL KNEE ARTHROPLASTY Right 11/08/2017  . TOTAL KNEE ARTHROPLASTY Right 11/08/2017   Procedure: TOTAL KNEE ARTHROPLASTY;  Surgeon: Melrose Nakayama, MD;  Location: [place];  Service: Orthopedics;  Laterality: Right;  . TRANSCATHETER AORTIC VALVE REPLACEMENT, TRANSFEMORAL N/A 03/15/2017   Procedure: TRANSCATHETER AORTIC VALVE REPLACEMENT, TRANSFEMORAL;  Surgeon: Burnell Blanks, MD;  Location: [place];  Service: Open Heart Surgery;  Laterality: N/A;  . VEIN SURGERY      Family History  Problem Relation Age of Onset  . Heart failure Father   . Emphysema Father   . Hypertension Mother   . Cancer Sister   . Healthy Sister     Social History:  reports that he has never smoked. He has never used smokeless tobacco. He reports that he does not drink alcohol or use drugs.  Review of Systems:  HYPERTENSION:   Currently being treated with Benicar 20 mg and amlodipine was stopped previously Also being followed by cardiologist who restarted the Benicar He has checked blood pressure at home infrequently now  BP Readings from Last 3 Encounters:  06/09/18 122/68  05/25/18 122/68  04/21/18 (!) 146/76    CARDIAC history:   No shortness of breath on exertion, this has not been a problem since his aortic valve replacement  EDEMA: He is on 80 mg twice daily of the Lasix dose but will take additional half a tablet more if his weight goes up Swelling is usually controlled now He does have some associated LV dysfunction  HYPERLIPIDEMIA: The lipid abnormality consists of elevated LDL and he is  taking 80 mg Pravachol,  LDL is below 70 Triglycerides have been normal   Lab Results  Component Value Date   CHOL 134 04/07/2018   HDL 51.80 04/07/2018   LDLCALC  58 04/07/2018   LDLDIRECT 101.0 04/26/2016   TRIG 124.0 04/07/2018   CHOLHDL 3 04/07/2018     IRON deficiency anemia:   The hemoglobin is lower unexpectedly even though iron saturation is improved from before  He is usually trying to take his iron tablet 2x daily  No history of B-12 deficiency Last Hemoccult was done in March and he has a history of gastric ulcer in the hospital in 3/19  CBC Latest Ref Rng & Units 06/06/2018 04/07/2018 04/03/2018  WBC 4.0 - 10.5 K/uL 7.0 6.8 7.7  Hemoglobin 13.0 - 17.0 g/dL 10.5(L) 11.4(L) 11.7(L)  Hematocrit 39.0 - 52.0 % 31.7(L) 34.1(L) 36.7(L)  Platelets 150.0 - 400.0 K/uL 151.0 144.0(L) 160   Lab Results  Component Value Date   OCCULTBLD POSITIVE (A) 11/16/2017   OCCULTBLD Negative 06/07/2017   OCCULTBLD Negative 02/13/2016    Foot exam last done in 04/2017  ROS    Physical Exam    BP 122/68   Pulse 70   Ht 5\' 8"  (1.727 m)   Wt 233 lb (105.7 kg)   SpO2 95%   BMI 35.43 kg/m        ASSESSMENT/ PLAN:   Diabetes type 2 with obesity  See history of present illness for detailed discussion of his current management, blood sugar patterns and problems identified  Blood sugars are under fair control with some tendency to postprandial hyperglycemia at night Also not checking blood sugars during the day after breakfast or lunch Fructosamine of 279 indicates relatively better controlled overall Appears that he does get some benefit from adding Amaryl Not able to exercise  Recommendations: He will need to check blood sugars after breakfast and lunch Also can increase his suppertime dose by at least 2 units Also reduce NPH by 2 units to avoid overnight hypoglycemia Stay on same doses of Amaryl and metformin Discussed blood sugar targets at bedtime  HYPERTENSION: Blood pressure is fairly well controlled  RENAL dysfunction: This is variable and related to being both on diuretics and Benicar He will need to follow-up with his  cardiologist for further adjustment since he does have some LV dysfunction also Consider reducing Benicar  EDEMA: Still taking high-dose Lasix  ANEMIA: Hemoglobin has dropped Not clear of etiology since iron saturation is improving May have some occult bleeding and he will be given Hemoccults to take home  Right knee pain: Continue follow-up with orthopedic surgeon  Total visit time for evaluation and management of multiple problems and counseling =25 minutes  Influenza vaccine given and patient information also handed  [name] 06/10/2018, 2:48 PM   Patient Instructions  28 N at bedtime  10-12 Humalog before supper  Check blood sugars on waking up days a week  Also check blood sugars about 2 hours after meals and do this after different meals by rotation  Recommended blood sugar levels on waking up are 90-130 and about 2 hours after meal is 130-160  Please bring your blood sugar monitor to each visit, thank you

## 2018-06-26 ENCOUNTER — Other Ambulatory Visit: Payer: Medicare Other

## 2018-06-26 ENCOUNTER — Other Ambulatory Visit (INDEPENDENT_AMBULATORY_CARE_PROVIDER_SITE_OTHER): Payer: Medicare Other

## 2018-06-26 ENCOUNTER — Other Ambulatory Visit: Payer: Self-pay

## 2018-06-26 DIAGNOSIS — Z1211 Encounter for screening for malignant neoplasm of colon: Secondary | ICD-10-CM | POA: Diagnosis not present

## 2018-06-26 NOTE — Addendum Note (Signed)
Addended by: Kaylyn Lim I on: 06/26/2018 10:14 AM   Modules accepted: Orders

## 2018-06-26 NOTE — Addendum Note (Signed)
Addended by: Kaylyn Lim I on: 06/26/2018 10:18 AM   Modules accepted: Orders

## 2018-06-26 NOTE — Addendum Note (Signed)
Addended by: Kaylyn Lim I on: 06/26/2018 10:15 AM   Modules accepted: Orders

## 2018-06-28 LAB — FECAL OCCULT BLOOD, IMMUNOCHEMICAL: FECAL OCCULT BLD: NEGATIVE

## 2018-07-04 ENCOUNTER — Other Ambulatory Visit: Payer: Self-pay | Admitting: Endocrinology

## 2018-07-04 ENCOUNTER — Telehealth: Payer: Self-pay | Admitting: Endocrinology

## 2018-07-04 NOTE — Telephone Encounter (Signed)
Patient is stating he needs refills on HumulinN insulin. Please Advise. [place]  Patient would [facility] on also. ELIQUIS 5 MG TABS tablet

## 2018-07-04 NOTE — Telephone Encounter (Signed)
Last OV-06/09/18  Pt requesting Rx humilinN insulin --is not in the medication list....please advise

## 2018-07-05 ENCOUNTER — Other Ambulatory Visit: Payer: Self-pay | Admitting: Internal Medicine

## 2018-07-05 MED ORDER — INSULIN NPH (HUMAN) (ISOPHANE) 100 UNIT/ML ~~LOC~~ SUSP: 28.0000 [IU] | Freq: Every day | SUBCUTANEOUS | 3 refills | Status: DC

## 2018-07-05 NOTE — Telephone Encounter (Signed)
Refilled novolin N 28 units. He will need to contact his cardiologist for refill on eliquis

## 2018-07-05 NOTE — Telephone Encounter (Signed)
lft pt vm informing pt that novolin has been faxed and to please contact cardiologist for elequis

## 2018-07-05 NOTE — Progress Notes (Signed)
Will refill Humulin N 28 units.   Patient will have to contact cardiology for cardiac med refill.    Abby Nena Jordan, MD  [facility]  [facility] [address] [place], [place] [address] Phone: [phone] FAX: [phone]

## 2018-07-12 ENCOUNTER — Encounter: Payer: Self-pay | Admitting: *Deleted

## 2018-07-13 ENCOUNTER — Telehealth: Payer: Self-pay | Admitting: Endocrinology

## 2018-07-13 NOTE — Telephone Encounter (Signed)
Gave patient results and scheduled lab visit-patient verbalized an understanding

## 2018-07-13 NOTE — Telephone Encounter (Signed)
Stool test does not show any blood loss. Please have him come in to the lab for CBC and B12 test and if still anemic he will need a hematology consultation

## 2018-07-17 NOTE — Progress Notes (Signed)
LVM- to return call to the office regarding lab results.

## 2018-07-23 ENCOUNTER — Encounter: Payer: Self-pay | Admitting: Interventional Cardiology

## 2018-07-23 NOTE — Progress Notes (Signed)
Cardiology Office Note:    Date:  07/26/2018   ID:  Todd Romero, DOB 1933/12/23, MRN [id number]  PCP:  [name], MD  Cardiologist:  Sinclair Grooms, MD   Referring MD: [name], MD   REASON FOR VISIT: Atrial fibrillation CAD  History of Present Illness:    Todd Romero is a 84 y.o. male with a hx of with a history of AS --> TAVR, , chronic diastolic heart failure, CAD with CTO of D1 and 90% LCX (cath 2013), OSA on CPAP, HTN, type 2 diabetes, and hyperlipidemia.  He denies chest pain, and orthopnea.  He has not had palpitations.  He is weighing every morning.  Weight is remaining stable.  He complains of frequent urination due to furosemide.  No nitroglycerin use.  No episodes of angina pectoris.  No episodes of syncope, presyncope, or noticeable palpitations.  Does notice lower extremity swelling.   Past Medical History:  Diagnosis Date  . Anemia   . Arthritis   . CAD (coronary artery disease)    a. cardiac cath 12/2016 showing moderate AS, elevated LVEDP, heavy 3V coronary calcification, 100% mD2, 30-50% LAD, 50-90% stenosis of Cx proximal to origin of L-PDA, RCA not engaged due to poor catheter control (difficult procedure) - coronary status was essentially unchanged from prior.  . Chronic diastolic CHF (congestive heart failure) ([place])   . Degenerative arthritis   . Diabetes ([place])   . Dyspnea   . Essential hypertension   . Hyperlipidemia   . New onset a-fib ([place]) 03/04/2018  . Obesity (BMI 30-39.9) 06/18/2015  . OSA (obstructive sleep apnea)    Severe with AHI 27/hr now on CPAP  no cpap  . Peritonitis ([place]) 1985?  [name] Pneumonia    "6 months - 84 years old"  . Pure hypercholesterolemia   . RBBB   . [facility] spotted fever   . 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 Severe aortic stenosis    a. s/p TAVR 03/2017.  .  Type II or unspecified type diabetes mellitus without mention of complication, not stated as uncontrolled     Past Surgical History:  Procedure Laterality Date  . APPENDECTOMY    . CARDIOVERSION N/A 04/03/2018   Procedure: CARDIOVERSION;  Surgeon: Josue Hector, MD;  Location: [facility] ENDOSCOPY;  Service: Cardiovascular;  Laterality: N/A;  . CARPAL TUNNEL RELEASE    . COLECTOMY     partial  . COLONOSCOPY WITH PROPOFOL N/A 11/18/2017   Procedure: COLONOSCOPY WITH PROPOFOL;  Surgeon: Carol Ada, MD;  Location: [facility] ENDOSCOPY;  Service: Endoscopy;  Laterality: N/A;  . ESOPHAGOGASTRODUODENOSCOPY N/A 11/18/2017   Procedure: ESOPHAGOGASTRODUODENOSCOPY (EGD);  Surgeon: Carol Ada, MD;  Location: [facility] ENDOSCOPY;  Service: Endoscopy;  Laterality: N/A;  . EYE SURGERY Bilateral    cataracts removed, lens placed  . JOINT REPLACEMENT    . KNEE CARTILAGE SURGERY    . PARTIAL COLECTOMY    . RIGHT/LEFT HEART CATH AND CORONARY ANGIOGRAPHY N/A 12/28/2016   Procedure: Right/Left Heart Cath and Coronary Angiography;  Surgeon: Belva Crome, MD;  Location: [place] CV LAB;  Service: Cardiovascular;  Laterality: N/A;  . TEE WITHOUT CARDIOVERSION N/A 03/15/2017   Procedure: TRANSESOPHAGEAL ECHOCARDIOGRAM (TEE);  Surgeon: Burnell Blanks, MD;  Location: [place];  Service: Open Heart Surgery;  Laterality: N/A;  . TOTAL KNEE ARTHROPLASTY Right 11/08/2017  . TOTAL KNEE  ARTHROPLASTY Right 11/08/2017   Procedure: TOTAL KNEE ARTHROPLASTY;  Surgeon: Melrose Nakayama, MD;  Location: [place];  Service: Orthopedics;  Laterality: Right;  . TRANSCATHETER AORTIC VALVE REPLACEMENT, TRANSFEMORAL N/A 03/15/2017   Procedure: TRANSCATHETER AORTIC VALVE REPLACEMENT, TRANSFEMORAL;  Surgeon: Burnell Blanks, MD;  Location: [place];  Service: Open Heart Surgery;  Laterality: N/A;  . VEIN SURGERY      Current Medications: Current Meds  Medication Sig  . ACCU-CHEK AVIVA PLUS test strip USE AS INSTRUCTED TO CHECK BLOOD SUGAR  TWICE A DAY  . acetaminophen (TYLENOL) 500 MG tablet Take 500 mg by mouth every 4 (four) hours as needed for moderate pain or headache.  [name] apixaban (ELIQUIS) 5 MG TABS tablet Take 1 tablet (5 mg total) by mouth 2 (two) times daily.  . ferrous sulfate 325 (65 FE) MG tablet Take 325 mg by mouth 2 (two) times daily with a meal.   . fish oil-omega-3 fatty acids 1000 MG capsule Take 1 g by mouth daily at 12 noon.   . furosemide (LASIX) 80 MG tablet Take 1 tablet (80 mg total) by mouth 2 (two) times daily. Take an extra half tablet (40 mg) if weight goes up 2 pounds or more from day before.  [name] glimepiride (AMARYL) 2 MG tablet Take one tablet twice daily  . insulin lispro (HUMALOG KWIKPEN) 100 UNIT/ML KiwkPen INJECT 4-8 UNITS BEFORE THE MAIN MEALS OF THE DAY  . insulin lispro (HUMALOG) 100 UNIT/ML injection Inject into the skin as directed.  . insulin NPH Human (NOVOLIN N) 100 UNIT/ML injection Inject 0.28 mLs (28 Units total) into the skin at bedtime.  . Insulin Pen Needle (B-D UF III MINI PEN NEEDLES) 31G X 5 MM MISC USE 2 PEN NEEDLE PER DAY WITH HUMALOG AND VICTOZA  . metFORMIN (GLUMETZA) 500 MG (MOD) 24 hr tablet Take 1 tablet (500 mg total) by mouth 2 (two) times daily with a meal. (morning & noon)  . Multiple Vitamin (MULTIVITAMIN WITH MINERALS) TABS tablet Take 1 tablet by mouth daily. Mens One a day Vit  . Multiple Vitamins-Minerals (PRESERVISION AREDS 2 PO) Take 1 capsule by mouth 2 (two) times daily.  [name] olmesartan (BENICAR) 20 MG tablet Take 1 tablet (20 mg total) by mouth daily.  . pantoprazole (PROTONIX) 40 MG tablet TAKE 1 TABLET BY MOUTH TWICE A DAY  . pravastatin (PRAVACHOL) 80 MG tablet Take 1 tablet (80 mg total) by mouth every evening.  . tamsulosin (FLOMAX) 0.4 MG CAPS capsule Take 1 capsule daily  . VICTOZA 18 MG/3ML SOPN INJECT 1.8 MG UNDER THE SKIN AT BEDTIME  . vitamin C (ASCORBIC ACID) 500 MG tablet Take 500 mg by mouth daily.  . [DISCONTINUED] ELIQUIS 5 MG TABS tablet TAKE 1  TABLET BY MOUTH TWICE A DAY     Allergies:   Bactrim [sulfamethoxazole-trimethoprim] and Morphine and related   Social History   Socioeconomic History  . Marital status: Married    Spouse name: Not on file  . Number of children: 4  . Years of education: Not on file  . Highest education level: Not on file  Occupational History  . Occupation: Construction-Retired  Social Needs  . Financial resource strain: Not on file  . Food insecurity:    Worry: Not on file    Inability: Not on file  . Transportation needs:    Medical: Not on file    Non-medical: Not on file  Tobacco Use  . Smoking status: Never Smoker  . Smokeless tobacco:  Never Used  Substance and Sexual Activity  . Alcohol use: No  . Drug use: No  . Sexual activity: Not on file  Lifestyle  . Physical activity:    Days per week: Not on file    Minutes per session: Not on file  . Stress: Not on file  Relationships  . Social connections:    Talks on phone: Not on file    Gets together: Not on file    Attends religious service: Not on file    Active member of club or organization: Not on file    Attends meetings of clubs or organizations: Not on file    Relationship status: Not on file  Other Topics Concern  . Not on file  Social History Narrative  . Not on file     Family History: The patient's family history includes Cancer in his sister; Emphysema in his father; Healthy in his sister; Heart failure in his father; Hypertension in his mother.  ROS:   Please see the history of present illness.    Right knee pain.  Recent right knee replacement.  Lower back discomfort.  Difficulty getting up and down.  Relatively sedentary lifestyle.  All other systems reviewed and are negative.  EKGs/Labs/Other Studies Reviewed:    The following studies were reviewed today: 2D Doppler echocardiogram June 2019: Study Conclusions  - Left ventricle: The cavity size was normal. Wall thickness was   increased in a pattern of  moderate LVH. The estimated ejection   fraction was 43%. Diffuse hypokinesis. The study is not   technically sufficient to allow evaluation of LV diastolic   function. Ejection fraction (MOD, 2-plane): 43%. - Aortic valve: s/p TAVR valve. No obstruction. Trivial   paravalvular leak. Mean gradient (S): 10 mm Hg. Peak gradient   (S): 24 mm Hg. Valve area (VTI): 1.18 cm^2. Valve area (Vmax):   1.09 cm^2. Valve area (Vmean): 1.2 cm^2. - Left atrium: The atrium was mildly dilated. - Right ventricle: The cavity size was mildly dilated. - Atrial septum: Small atrial septal defect. - Tricuspid valve: There was moderate regurgitation. - Pulmonary arteries: PA peak pressure: 47 mm Hg (S). - Inferior vena cava: The vessel was dilated. The respirophasic   diameter changes were blunted (< 50%), consistent with elevated   central venous pressure.  Impressions:  - Compared to a prior study in 04/2017, the LVEF is lower at 43%   with global hypokinesis. The TAVR valve is non-obstructed with   trivial perivalvular leak and has a mean gradient of 10 mmHg. A   small atrial septal defect is noted. There is moderate TR with an   RVSP of 47 mmHg and a dilated IVC.   EKG:  EKG is not ordered today.    Recent Labs: 03/04/2018: B Natriuretic Peptide 261.9 03/24/2018: NT-Pro BNP 1,849 03/27/2018: Magnesium 2.2; TSH 0.862 07/25/2018: ALT 12; BUN 41; Creatinine, Ser 1.43; Hemoglobin 11.4; Platelets 133.0; Potassium 4.2; Sodium 138  Recent Lipid Panel    Component Value Date/Time   CHOL 134 04/07/2018 0910   TRIG 124.0 04/07/2018 0910   HDL 51.80 04/07/2018 0910   CHOLHDL 3 04/07/2018 0910   VLDL 24.8 04/07/2018 0910   LDLCALC 58 04/07/2018 0910   LDLDIRECT 101.0 04/26/2016 0915    Physical Exam:    VS:  BP 126/70   Pulse 74   Ht 5\' 8"  (1.727 m)   Wt 234 lb 9.6 oz (106.4 kg)   SpO2 96%   BMI 35.67 kg/m  Wt Readings from Last 3 Encounters:  07/25/18 234 lb 9.6 oz (106.4 kg)  06/09/18 233  lb (105.7 kg)  05/25/18 231 lb (104.8 kg)     GEN: Slender.  Well nourished, well developed in no acute distress HEENT: Normal NECK: No JVD. LYMPHATICS: No lymphadenopathy CARDIAC: IRR, positive 1/6 systolic but no diastolic murmur, no gallop, 2+ right ankle and trace left ankle edema. VASCULAR: 2+ radial and carotid bilateral pulses.  No bruits. RESPIRATORY:  Clear to auscultation without rales, wheezing or rhonchi  ABDOMEN: Soft, non-tender, non-distended, No pulsatile mass, MUSCULOSKELETAL: No deformity  SKIN: Warm and dry NEUROLOGIC:  Alert and oriented x 3 PSYCHIATRIC:  Normal affect   ASSESSMENT:    1. Coronary artery disease involving native coronary artery of native heart with angina pectoris ([place])   2. S/P TAVR (transcatheter aortic valve replacement)   3. Essential hypertension, benign   4. Paroxysmal atrial fibrillation ([facility])   5. Chronic diastolic heart failure ([place])   6. Obstructive sleep apnea   7. Other hyperlipidemia   8. RBBB    PLAN:    In order of problems listed above:  1. Stable without angina pectoris. 2. Clinically normal function of the bioprosthetic TAVR valve.  Recent echo confirms the clinical suspicion. 3. Blood pressure is excellent.  Target 130/80 mmHg. 4. Concerned about atrial fibrillation today with irregular heartbeat.  EKG today demonstrates PVCs with sinus rhythm, prolonged PR interval, and the limb leads P waves are difficult to appreciate but are clearly seen in lead III as well as in V6. 5. Mild volume overload.  Weight is stable.    Medication Adjustments/Labs and Tests Ordered: Current medicines are reviewed at length with the patient today.  Concerns regarding medicines are outlined above.  Orders Placed This Encounter  Procedures  . EKG 12-Lead   Meds ordered this encounter  Medications  . apixaban (ELIQUIS) 5 MG TABS tablet    Sig: Take 1 tablet (5 mg total) by mouth 2 (two) times daily.    Dispense:  180 tablet    Refill:   3    Patient Instructions  Medication Instructions:  Your physician recommends that you continue on your current medications as directed. Please refer to the Current Medication list given to you today.  If you need a refill on your cardiac medications before your next appointment, please call your pharmacy.   Lab work: None  If you have labs (blood work) drawn today and your tests are completely normal, you will receive your results only by: [name] MyChart Message (if you have MyChart) OR . A paper copy in the mail If you have any lab test that is abnormal or we need to change your treatment, we will call you to review the results.  Testing/Procedures: None  Follow-Up: Your physician recommends that you schedule a follow-up appointment in: 3 months with a PA or NP.   At [facility], you and your health needs are our priority.  As part of our continuing mission to provide you with exceptional heart care, we have created designated Provider Care Teams.  These Care Teams include your primary Cardiologist (physician) and Advanced Practice Providers (APPs -  Physician Assistants and Nurse Practitioners) who all work together to provide you with the care you need, when you need it. You will need a follow up appointment in 6 months.  Please call our office 2 months in advance to schedule this appointment.  You may see Sinclair Grooms, MD or  one of the following Advanced Practice Providers on your designated Care Team:   Truitt Merle, NP Cecilie Kicks, NP . Kathyrn Drown, NP  Any Other Special Instructions Will Be Listed Below (If Applicable).       Signed, Sinclair Grooms, MD  07/26/2018 11:11 AM    [place]

## 2018-07-24 ENCOUNTER — Telehealth: Payer: Self-pay | Admitting: Endocrinology

## 2018-07-24 NOTE — Telephone Encounter (Signed)
Patient is returning Todd Romero's call. Please advise, thanks

## 2018-07-24 NOTE — Progress Notes (Signed)
Notified pt with lab results and with Dr. Dwyane Dee instructions. Pt have an appt. With the lab tomorrow.

## 2018-07-25 ENCOUNTER — Ambulatory Visit (INDEPENDENT_AMBULATORY_CARE_PROVIDER_SITE_OTHER): Payer: Medicare Other | Admitting: Interventional Cardiology

## 2018-07-25 ENCOUNTER — Other Ambulatory Visit (INDEPENDENT_AMBULATORY_CARE_PROVIDER_SITE_OTHER): Payer: Medicare Other

## 2018-07-25 ENCOUNTER — Encounter: Payer: Self-pay | Admitting: Interventional Cardiology

## 2018-07-25 VITALS — BP 126/70 | HR 74 | Ht 68.0 in | Wt 234.6 lb

## 2018-07-25 DIAGNOSIS — I25119 Atherosclerotic heart disease of native coronary artery with unspecified angina pectoris: Secondary | ICD-10-CM | POA: Diagnosis not present

## 2018-07-25 DIAGNOSIS — I10 Essential (primary) hypertension: Secondary | ICD-10-CM | POA: Diagnosis not present

## 2018-07-25 DIAGNOSIS — Z952 Presence of prosthetic heart valve: Secondary | ICD-10-CM

## 2018-07-25 DIAGNOSIS — N289 Disorder of kidney and ureter, unspecified: Secondary | ICD-10-CM | POA: Diagnosis not present

## 2018-07-25 DIAGNOSIS — I480 Paroxysmal atrial fibrillation: Secondary | ICD-10-CM | POA: Diagnosis not present

## 2018-07-25 DIAGNOSIS — D539 Nutritional anemia, unspecified: Secondary | ICD-10-CM | POA: Diagnosis not present

## 2018-07-25 DIAGNOSIS — Z794 Long term (current) use of insulin: Secondary | ICD-10-CM

## 2018-07-25 DIAGNOSIS — I5032 Chronic diastolic (congestive) heart failure: Secondary | ICD-10-CM

## 2018-07-25 DIAGNOSIS — E1165 Type 2 diabetes mellitus with hyperglycemia: Secondary | ICD-10-CM | POA: Diagnosis not present

## 2018-07-25 DIAGNOSIS — E7849 Other hyperlipidemia: Secondary | ICD-10-CM

## 2018-07-25 DIAGNOSIS — G4733 Obstructive sleep apnea (adult) (pediatric): Secondary | ICD-10-CM

## 2018-07-25 DIAGNOSIS — I4510 Unspecified right bundle-branch block: Secondary | ICD-10-CM

## 2018-07-25 LAB — CBC
HEMATOCRIT: 34 % — AB (ref 39.0–52.0)
HEMOGLOBIN: 11.4 g/dL — AB (ref 13.0–17.0)
MCHC: 33.4 g/dL (ref 30.0–36.0)
MCV: 92.1 fl (ref 78.0–100.0)
Platelets: 133 10*3/uL — ABNORMAL LOW (ref 150.0–400.0)
RBC: 3.7 Mil/uL — ABNORMAL LOW (ref 4.22–5.81)
RDW: 14.9 % (ref 11.5–15.5)
WBC: 6.7 10*3/uL (ref 4.0–10.5)

## 2018-07-25 LAB — COMPREHENSIVE METABOLIC PANEL
ALBUMIN: 4 g/dL (ref 3.5–5.2)
ALT: 12 U/L (ref 0–53)
AST: 18 U/L (ref 0–37)
Alkaline Phosphatase: 63 U/L (ref 39–117)
BILIRUBIN TOTAL: 0.3 mg/dL (ref 0.2–1.2)
BUN: 41 mg/dL — ABNORMAL HIGH (ref 6–23)
CALCIUM: 9.1 mg/dL (ref 8.4–10.5)
CO2: 25 meq/L (ref 19–32)
CREATININE: 1.43 mg/dL (ref 0.40–1.50)
Chloride: 104 mEq/L (ref 96–112)
GFR: 50.28 mL/min — ABNORMAL LOW (ref >60.00)
Glucose, Bld: 147 mg/dL — ABNORMAL HIGH (ref 70–99)
Potassium: 4.2 mEq/L (ref 3.5–5.1)
Sodium: 138 mEq/L (ref 135–145)
TOTAL PROTEIN: 6.6 g/dL (ref 6.0–8.3)

## 2018-07-25 LAB — VITAMIN B12: Vitamin B-12: 402 pg/mL (ref 211–911)

## 2018-07-25 LAB — HEMOGLOBIN A1C: Hgb A1c MFr Bld: 7.1 % — ABNORMAL HIGH (ref 4.6–6.5)

## 2018-07-25 MED ORDER — APIXABAN 5 MG PO TABS: 5.0000 mg | ORAL_TABLET | Freq: Two times a day (BID) | ORAL | 3 refills | Status: DC

## 2018-07-25 NOTE — Patient Instructions (Addendum)
Medication Instructions:  Your physician recommends that you continue on your current medications as directed. Please refer to the Current Medication list given to you today.  If you need a refill on your cardiac medications before your next appointment, please call your pharmacy.   Lab work: None  If you have labs (blood work) drawn today and your tests are completely normal, you will receive your results only by: [name] MyChart Message (if you have MyChart) OR . A paper copy in the mail If you have any lab test that is abnormal or we need to change your treatment, we will call you to review the results.  Testing/Procedures: None  Follow-Up: Your physician recommends that you schedule a follow-up appointment in: 3 months with a PA or NP.   At [facility], you and your health needs are our priority.  As part of our continuing mission to provide you with exceptional heart care, we have created designated Provider Care Teams.  These Care Teams include your primary Cardiologist (physician) and Advanced Practice Providers (APPs -  Physician Assistants and Nurse Practitioners) who all work together to provide you with the care you need, when you need it. You will need a follow up appointment in 6 months.  Please call our office 2 months in advance to schedule this appointment.  You may see Sinclair Grooms, MD or one of the following Advanced Practice Providers on your designated Care Team:   Truitt Merle, NP Cecilie Kicks, NP . Kathyrn Drown, NP  Any Other Special Instructions Will Be Listed Below (If Applicable).

## 2018-07-26 ENCOUNTER — Encounter: Payer: Self-pay | Admitting: Interventional Cardiology

## 2018-08-09 ENCOUNTER — Other Ambulatory Visit: Payer: Self-pay | Admitting: Endocrinology

## 2018-08-10 ENCOUNTER — Other Ambulatory Visit: Payer: Medicare Other

## 2018-08-14 NOTE — Progress Notes (Signed)
Patient ID: Todd Romero, male   DOB: 01-08-1934, 84 y.o.   MRN: [id number]    Reason for Appointment:   management of various problems    History of Present Illness   Diagnosis: Type 2 DIABETES MELITUS, date of diagnosis:  1997   He has been on various regimens for his diabetes and has been on bedtime insulin with NPH since 2005 He also has benefited from adding Victoza in 2011 with better postprandial control and some weight loss Previously his weight has been as much as 247 pounds  On Victoza  since 11/14 with further improvement in blood sugar control. Since about 2015 his blood sugars have been overall mildly high with A1c over 7% consistently.  RECENT HISTORY:  His A1c in 8/19 was much higher at 8.2, is now 7.1  Non-insulin hypoglycemic drugs: Amaryl 2 mg twice daily. and metformin 500 mg twice a day, Victoza 1.8 mg daily        Side effects from medications: None  Insulin regimen: NPH 28units at bedtime and Humalog 10 units at suppertime    Current management, blood sugar patterns and problems identified:  He still has not changed his insulin doses as prescribed and taking the same amount although he did cut back 2 units on his NPH  He says that his blood sugars fluctuate at suppertime and he does not like to increase his Humalog  However his blood sugars have been as high as 302  Does not understand the factors like carbohydrates or high fat intake that increase his blood sugars  He has been told to check his blood sugars after breakfast and lunch also but does not do so  Has only 1 reading after lunch of 198  His glucose meter has been programmed to the wrong time after his battery change and is about 7 hours behind  Although his blood sugars have been as low as 74 fasting last month they are not consistently on the low side recently  Still not able to do much exercise  His weight is about the same  He is very consistent with taking his  Victoza  Monitors blood glucose:about 2x a day.    Glucometer:  Accu-Chek         Blood Glucose readings from meter download:    PRE-MEAL Fasting Lunch Dinner Bedtime Overall  Glucose range:  74-124    127-302   Mean/median: 97    185  144   POST-MEAL PC Breakfast PC Lunch PC Dinner  Glucose range:   98   Mean/median:       Previous readings  PRE-MEAL Fasting Lunch Dinner Bedtime Overall  Glucose range:  79-159      Mean/median:  109     149   POST-MEAL PC Breakfast PC Lunch PC Dinner  Glucose range:    122-357  Mean/median:    189    Physical activity: exercise: Unable to do any  Meal times: Dinner about 5-7 PM  Wt Readings from Last 3 Encounters:  08/15/18 231 lb 6.4 oz (105 kg)  07/25/18 234 lb 9.6 oz (106.4 kg)  06/09/18 233 lb (105.7 kg)   LABS: Lab Results  Component Value Date   HGBA1C 7.1 (H) 07/25/2018   HGBA1C 8.2 (H) 04/07/2018   HGBA1C 6.9 (H) 02/10/2018   Lab Results  Component Value Date   MICROALBUR 22.8 (H) 10/31/2017   LDLCALC 58 04/07/2018   CREATININE 1.43 07/25/2018  OTHER active problems are discussed in review of systems    Allergies as of 08/15/2018      Reactions   Bactrim [sulfamethoxazole-trimethoprim] Nausea And Vomiting, Other (See Comments)   Bleeding and ulcers   Morphine And Related Nausea And Vomiting      Medication List        Accurate as of 08/15/18  1:55 PM. Always use your most recent med list.          ACCU-CHEK AVIVA PLUS test strip Generic drug:  glucose blood USE AS INSTRUCTED TO CHECK BLOOD SUGAR TWICE A DAY   acetaminophen 500 MG tablet Commonly known as:  TYLENOL Take 500 mg by mouth every 4 (four) hours as needed for moderate pain or headache.   apixaban 5 MG Tabs tablet Commonly known as:  ELIQUIS Take 1 tablet (5 mg total) by mouth 2 (two) times daily.   ferrous sulfate 325 (65 FE) MG tablet Take 325 mg by mouth 2 (two) times daily with a meal.   fish oil-omega-3 fatty acids 1000 MG  capsule Take 1 g by mouth daily at 12 noon.   furosemide 80 MG tablet Commonly known as:  LASIX Take 1 tablet (80 mg total) by mouth 2 (two) times daily. Take an extra half tablet (40 mg) if weight goes up 2 pounds or more from day before.   glimepiride 2 MG tablet Commonly known as:  AMARYL Take one tablet twice daily   HUMALOG 100 UNIT/ML injection Generic drug:  insulin lispro Inject into the skin as directed.   insulin lispro 100 UNIT/ML KiwkPen Commonly known as:  HUMALOG INJECT 4-8 UNITS BEFORE THE MAIN MEALS OF THE DAY   insulin NPH Human 100 UNIT/ML injection Commonly known as:  HUMULIN N,NOVOLIN N Inject 0.28 mLs (28 Units total) into the skin at bedtime.   Insulin Pen Needle 31G X 5 MM Misc USE 2 PEN NEEDLE PER DAY WITH HUMALOG AND VICTOZA   metFORMIN 500 MG (MOD) 24 hr tablet Commonly known as:  GLUMETZA Take 1 tablet (500 mg total) by mouth 2 (two) times daily with a meal. (morning & noon)   multivitamin with minerals Tabs tablet Take 1 tablet by mouth daily. Mens One a day Vit   olmesartan 20 MG tablet Commonly known as:  BENICAR Take 1 tablet (20 mg total) by mouth daily.   pantoprazole 40 MG tablet Commonly known as:  PROTONIX TAKE 1 TABLET BY MOUTH TWICE A DAY   pravastatin 80 MG tablet Commonly known as:  PRAVACHOL Take 1 tablet (80 mg total) by mouth every evening.   PRESERVISION AREDS 2 PO Take 1 capsule by mouth 2 (two) times daily.   tamsulosin 0.4 MG Caps capsule Commonly known as:  FLOMAX Take 1 capsule daily   VICTOZA 18 MG/3ML Sopn Generic drug:  liraglutide INJECT 1.8 MG UNDER THE SKIN AT BEDTIME   vitamin C 500 MG tablet Commonly known as:  ASCORBIC ACID Take 500 mg by mouth daily.       Allergies:  Allergies  Allergen Reactions  . Bactrim [Sulfamethoxazole-Trimethoprim] Nausea And Vomiting and Other (See Comments)    Bleeding and ulcers  . Morphine And Related Nausea And Vomiting    Past Medical History:  Diagnosis  Date  . Anemia   . Arthritis   . CAD (coronary artery disease)    a. cardiac cath 12/2016 showing moderate AS, elevated LVEDP, heavy 3V coronary calcification, 100% mD2, 30-50% LAD, 50-90% stenosis of Cx proximal to  origin of L-PDA, RCA not engaged due to poor catheter control (difficult procedure) - coronary status was essentially unchanged from prior.  . Chronic diastolic CHF (congestive heart failure) ([place])   . Degenerative arthritis   . Diabetes ([place])   . Dyspnea   . Essential hypertension   . Hyperlipidemia   . New onset a-fib ([place]) 03/04/2018  . Obesity (BMI 30-39.9) 06/18/2015  . OSA (obstructive sleep apnea)    Severe with AHI 27/hr now on CPAP  no cpap  . Peritonitis ([place]) 1985?  [name] Pneumonia    "6 months - 84 years old"  . Pure hypercholesterolemia   . RBBB   . [facility] spotted fever   . 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 Severe aortic stenosis    a. s/p TAVR 03/2017.  . Type II or unspecified type diabetes mellitus without mention of complication, not stated as uncontrolled     Past Surgical History:  Procedure Laterality Date  . APPENDECTOMY    . CARDIOVERSION N/A 04/03/2018   Procedure: CARDIOVERSION;  Surgeon: Josue Hector, MD;  Location: [facility] ENDOSCOPY;  Service: Cardiovascular;  Laterality: N/A;  . CARPAL TUNNEL RELEASE    . COLECTOMY     partial  . COLONOSCOPY WITH PROPOFOL N/A 11/18/2017   Procedure: COLONOSCOPY WITH PROPOFOL;  Surgeon: Carol Ada, MD;  Location: [facility] ENDOSCOPY;  Service: Endoscopy;  Laterality: N/A;  . ESOPHAGOGASTRODUODENOSCOPY N/A 11/18/2017   Procedure: ESOPHAGOGASTRODUODENOSCOPY (EGD);  Surgeon: Carol Ada, MD;  Location: [facility] ENDOSCOPY;  Service: Endoscopy;  Laterality: N/A;  . EYE SURGERY Bilateral    cataracts removed, lens placed  . JOINT REPLACEMENT    . KNEE CARTILAGE SURGERY    . PARTIAL  COLECTOMY    . RIGHT/LEFT HEART CATH AND CORONARY ANGIOGRAPHY N/A 12/28/2016   Procedure: Right/Left Heart Cath and Coronary Angiography;  Surgeon: Belva Crome, MD;  Location: [facility] CV LAB;  Service: Cardiovascular;  Laterality: N/A;  . TEE WITHOUT CARDIOVERSION N/A 03/15/2017   Procedure: TRANSESOPHAGEAL ECHOCARDIOGRAM (TEE);  Surgeon: Burnell Blanks, MD;  Location: [place];  Service: Open Heart Surgery;  Laterality: N/A;  . TOTAL KNEE ARTHROPLASTY Right 11/08/2017  . TOTAL KNEE ARTHROPLASTY Right 11/08/2017   Procedure: TOTAL KNEE ARTHROPLASTY;  Surgeon: Melrose Nakayama, MD;  Location: [place];  Service: Orthopedics;  Laterality: Right;  . TRANSCATHETER AORTIC VALVE REPLACEMENT, TRANSFEMORAL N/A 03/15/2017   Procedure: TRANSCATHETER AORTIC VALVE REPLACEMENT, TRANSFEMORAL;  Surgeon: Burnell Blanks, MD;  Location: [place];  Service: Open Heart Surgery;  Laterality: N/A;  . VEIN SURGERY      Family History  Problem Relation Age of Onset  . Heart failure Father   . Emphysema Father   . Hypertension Mother   . Cancer Sister   . Healthy Sister     Social History:  reports that he has never smoked. He has never used smokeless tobacco. He reports that he does not drink alcohol or use drugs.  Review of Systems:  COUGH: He has had a little more cough and clear phlegm over the last week or so He says his cough is worse when he lies down and he tries to sleep with 1 low on the side No fever and no shortness of breath Using OTC [place] with some relief  HYPERTENSION:   Has been being treated with Benicar 20 mg Followed by cardiologist Recently has good control  He has checked blood pressure at home infrequently now  BP Readings from Last 3 Encounters:  08/15/18 140/78  07/25/18 126/70  06/09/18 122/68    CARDIAC history:   No shortness of breath on exertion since his aortic valve replacement  EDEMA: He is on 80 mg twice daily of the Lasix dose but will take  additional half a tablet more if his weight goes up, recently has been better controlled He does have some associated LV dysfunction  HYPERLIPIDEMIA: The lipid abnormality consists of elevated LDL and he is taking 80 mg Pravachol,  LDL is below 70 Triglycerides have been normal   Lab Results  Component Value Date   CHOL 134 04/07/2018   HDL 51.80 04/07/2018   LDLCALC 58 04/07/2018   LDLDIRECT 101.0 04/26/2016   TRIG 124.0 04/07/2018   CHOLHDL 3 04/07/2018     IRON deficiency anemia:   The hemoglobin is improving  He is usually trying to take his iron tablet 2x daily  No history of B-12 deficiency Last Hemoccult was as below  CBC Latest Ref Rng & Units 07/25/2018 06/06/2018 04/07/2018  WBC 4.0 - 10.5 K/uL 6.7 7.0 6.8  Hemoglobin 13.0 - 17.0 g/dL 11.4(L) 10.5(L) 11.4(L)  Hematocrit 39.0 - 52.0 % 34.0(L) 31.7(L) 34.1(L)  Platelets 150.0 - 400.0 K/uL 133.0(L) 151.0 144.0(L)   Lab Results  Component Value Date   OCCULTBLD Negative 06/26/2018   OCCULTBLD POSITIVE (A) 11/16/2017   OCCULTBLD Negative 06/07/2017      ROS    Physical Exam    BP 140/78 (BP Location: Left Arm, Patient Position: Sitting, Cuff Size: Normal)   Pulse 74   Ht 5\' 8"  (1.727 m)   Wt 231 lb 6.4 oz (105 kg)   SpO2 98%   BMI 35.18 kg/m   Heart sounds regular, no added sound Lungs clear bilaterally No dullness to percussion No pedal edema No pallor   ASSESSMENT/ PLAN:   Diabetes type 2 with obesity  See history of present illness for detailed discussion of his current management, blood sugar patterns and problems identified  His A1c is improved at 7.1 even though his blood sugars are still mostly high after meals  He is still not understanding the adjustment of Humalog for various types of meals and controlling his evening readings consistently which can be over 300 at times Fasting readings are excellent but NPH and even lower than the last time despite reducing the dose by 2  units  Recommendations: He will increase his NovoLog by 2 to 4 units based on his meal intake Discussed differences between carbohydrates, high fats and what would need extra insulin He will try to check readings more often after meals and less in the morning Also reduce NPH by 2 units again to avoid overnight hypoglycemia Stay on same doses of Amaryl and metformin To call if blood sugars are not controlled  HYPERTENSION: Blood pressure is  well controlled  RENAL dysfunction: This is variable and to follow-up with cardiologist for adjustment of diuretics which will affect his renal status No diabetic nephropathy  EDEMA: Controlled with taking high-dose Lasix  ANEMIA: Hemoglobin has improved and will continue to follow, he will stay on the same dose of iron, currently B12 normal  PNEUMOVAX booster given  Recent cough: Likely to be upper respiratory viral infection, no need for x-ray today  He will establish with a PCP for general care  Total visit time for evaluation and management of multiple problems and counseling =25 minutes  [name] 08/15/2018, 1:55 PM   There are no Patient Instructions on file for this visit.

## 2018-08-15 ENCOUNTER — Encounter: Payer: Self-pay | Admitting: Endocrinology

## 2018-08-15 ENCOUNTER — Ambulatory Visit (INDEPENDENT_AMBULATORY_CARE_PROVIDER_SITE_OTHER): Payer: Medicare Other | Admitting: Endocrinology

## 2018-08-15 VITALS — BP 140/78 | HR 74 | Ht 68.0 in | Wt 231.4 lb

## 2018-08-15 DIAGNOSIS — Z794 Long term (current) use of insulin: Secondary | ICD-10-CM

## 2018-08-15 DIAGNOSIS — Z23 Encounter for immunization: Secondary | ICD-10-CM

## 2018-08-15 DIAGNOSIS — E1165 Type 2 diabetes mellitus with hyperglycemia: Secondary | ICD-10-CM | POA: Diagnosis not present

## 2018-08-15 DIAGNOSIS — I10 Essential (primary) hypertension: Secondary | ICD-10-CM

## 2018-08-15 DIAGNOSIS — D508 Other iron deficiency anemias: Secondary | ICD-10-CM | POA: Diagnosis not present

## 2018-08-15 MED ORDER — PNEUMOCOCCAL VAC POLYVALENT 25 MCG/0.5ML IJ INJ
0.5000 mL | INJECTION | INTRAMUSCULAR | Status: AC
  Administered 2018-08-16: 0.5 mL via INTRAMUSCULAR

## 2018-08-15 NOTE — Patient Instructions (Signed)
26 N at nite and Humalog 12-14 at supper

## 2018-08-16 DIAGNOSIS — Z23 Encounter for immunization: Secondary | ICD-10-CM | POA: Diagnosis not present

## 2018-09-14 ENCOUNTER — Other Ambulatory Visit: Payer: Self-pay

## 2018-09-14 ENCOUNTER — Telehealth: Payer: Self-pay | Admitting: Endocrinology

## 2018-09-14 MED ORDER — INSULIN NPH (HUMAN) (ISOPHANE) 100 UNIT/ML ~~LOC~~ SUSP: 28.0000 [IU] | Freq: Every day | SUBCUTANEOUS | 3 refills | Status: DC

## 2018-09-14 NOTE — Telephone Encounter (Signed)
Rx sent 

## 2018-09-14 NOTE — Telephone Encounter (Signed)
Patient states he was taking Humlin N and the hospital changed it to Insulin MPH but the Humlin N has expired with [facility] and needs to be renewed. Please Advise, thanks  Ph # [phone]

## 2018-10-04 ENCOUNTER — Other Ambulatory Visit: Payer: Self-pay

## 2018-10-04 ENCOUNTER — Telehealth: Payer: Self-pay | Admitting: Endocrinology

## 2018-10-04 MED ORDER — PRAVASTATIN SODIUM 80 MG PO TABS: 80.0000 mg | ORAL_TABLET | Freq: Every evening | ORAL | 1 refills | Status: DC

## 2018-10-04 MED ORDER — TAMSULOSIN HCL 0.4 MG PO CAPS: ORAL_CAPSULE | ORAL | 1 refills | Status: DC

## 2018-10-04 NOTE — Telephone Encounter (Signed)
MEDICATION:  tamsulosin (FLOMAX) 0.4 MG CAPS capsule , pravastatin (PRAVACHOL) 80 MG tablet  PHARMACY:  [facility], [place] A 90 DAY SUPPLY : yes  IS PATIENT OUT OF MEDICATION: yes and out of refills.   IF NOT; HOW MUCH IS LEFT:   LAST APPOINTMENT DATE: @1 /05/2019  NEXT APPOINTMENT DATE:@3 /05/2019  DO WE HAVE YOUR PERMISSION TO LEAVE A DETAILED MESSAGE:  OTHER COMMENTS:    **Let patient know to contact pharmacy at the end of the day to make sure medication is ready. **  ** Please notify patient to allow 48-72 hours to process**  **Encourage patient to contact the pharmacy for refills or they can request refills through [facility]**

## 2018-10-04 NOTE — Telephone Encounter (Signed)
Rx has been sent  

## 2018-10-25 ENCOUNTER — Other Ambulatory Visit: Payer: Self-pay | Admitting: Endocrinology

## 2018-10-31 ENCOUNTER — Ambulatory Visit: Payer: Medicare Other | Admitting: Nurse Practitioner

## 2018-11-13 ENCOUNTER — Encounter (INDEPENDENT_AMBULATORY_CARE_PROVIDER_SITE_OTHER): Payer: Medicare Other

## 2018-11-13 ENCOUNTER — Encounter: Payer: Self-pay | Admitting: Nurse Practitioner

## 2018-11-13 ENCOUNTER — Other Ambulatory Visit (INDEPENDENT_AMBULATORY_CARE_PROVIDER_SITE_OTHER): Payer: Medicare Other

## 2018-11-13 ENCOUNTER — Ambulatory Visit (INDEPENDENT_AMBULATORY_CARE_PROVIDER_SITE_OTHER): Payer: Medicare Other | Admitting: Nurse Practitioner

## 2018-11-13 VITALS — BP 140/70 | HR 55 | Ht 68.0 in | Wt 242.4 lb

## 2018-11-13 DIAGNOSIS — E1165 Type 2 diabetes mellitus with hyperglycemia: Secondary | ICD-10-CM | POA: Diagnosis not present

## 2018-11-13 DIAGNOSIS — I480 Paroxysmal atrial fibrillation: Secondary | ICD-10-CM

## 2018-11-13 DIAGNOSIS — I5032 Chronic diastolic (congestive) heart failure: Secondary | ICD-10-CM | POA: Diagnosis not present

## 2018-11-13 DIAGNOSIS — D508 Other iron deficiency anemias: Secondary | ICD-10-CM

## 2018-11-13 DIAGNOSIS — Z952 Presence of prosthetic heart valve: Secondary | ICD-10-CM | POA: Diagnosis not present

## 2018-11-13 DIAGNOSIS — Z794 Long term (current) use of insulin: Secondary | ICD-10-CM

## 2018-11-13 DIAGNOSIS — I5042 Chronic combined systolic (congestive) and diastolic (congestive) heart failure: Secondary | ICD-10-CM

## 2018-11-13 DIAGNOSIS — I25119 Atherosclerotic heart disease of native coronary artery with unspecified angina pectoris: Secondary | ICD-10-CM

## 2018-11-13 LAB — COMPREHENSIVE METABOLIC PANEL
ALT: 15 U/L (ref 0–53)
AST: 19 U/L (ref 0–37)
Albumin: 4.2 g/dL (ref 3.5–5.2)
Alkaline Phosphatase: 69 U/L (ref 39–117)
BILIRUBIN TOTAL: 0.4 mg/dL (ref 0.2–1.2)
BUN: 40 mg/dL — ABNORMAL HIGH (ref 6–23)
CO2: 26 mEq/L (ref 19–32)
CREATININE: 1.4 mg/dL (ref 0.40–1.50)
Calcium: 9.3 mg/dL (ref 8.4–10.5)
Chloride: 102 mEq/L (ref 96–112)
GFR: 48.44 mL/min — ABNORMAL LOW (ref >60.00)
Glucose, Bld: 120 mg/dL — ABNORMAL HIGH (ref 70–99)
Potassium: 4.8 mEq/L (ref 3.5–5.1)
Sodium: 137 mEq/L (ref 135–145)
Total Protein: 7 g/dL (ref 6.0–8.3)

## 2018-11-13 LAB — CBC
HCT: 34.8 % — ABNORMAL LOW (ref 39.0–52.0)
Hemoglobin: 11.4 g/dL — ABNORMAL LOW (ref 13.0–17.0)
MCHC: 32.8 g/dL (ref 30.0–36.0)
MCV: 91.3 fl (ref 78.0–100.0)
Platelets: 133 10*3/uL — ABNORMAL LOW (ref 150.0–400.0)
RBC: 3.81 Mil/uL — ABNORMAL LOW (ref 4.22–5.81)
RDW: 15.4 % (ref 11.5–15.5)
WBC: 6.8 10*3/uL (ref 4.0–10.5)

## 2018-11-13 LAB — HEMOGLOBIN A1C: Hgb A1c MFr Bld: 7.7 % — ABNORMAL HIGH (ref 4.6–6.5)

## 2018-11-13 MED ORDER — PANTOPRAZOLE SODIUM 40 MG PO TBEC: 40.0000 mg | DELAYED_RELEASE_TABLET | Freq: Two times a day (BID) | ORAL | 3 refills | Status: DC

## 2018-11-13 NOTE — Patient Instructions (Addendum)
We will be checking the following labs today - BNP    Medication Instructions:    Continue with your current medicines. BUT  I want you to take an extra 40 mg of lasix for the next 3 days only - then back to your regular dose.  I refilled your Protonix today    If you need a refill on your cardiac medications before your next appointment, please call your pharmacy.     Testing/Procedures To Be Arranged:  24 hour Holter  Follow-Up:   See me in about 2 to 3 weeks   See Dr. Tamala Julian as planned in May   At [facility], you and your health needs are our priority.  As part of our continuing mission to provide you with exceptional heart care, we have created designated Provider Care Teams.  These Care Teams include your primary Cardiologist (physician) and Advanced Practice Providers (APPs -  Physician Assistants and Nurse Practitioners) who all work together to provide you with the care you need, when you need it.  Special Instructions:  [name] Go back to restricting your salt . Weigh daily  Call the [place] office at [phone] if you have any questions, problems or concerns.

## 2018-11-13 NOTE — Progress Notes (Signed)
CARDIOLOGY OFFICE NOTE  Date:  11/13/2018    Todd Romero Date of Birth: 03-25-1934 Medical Record #[id number]  PCP:  Ma Hillock, DO  Cardiologist:  Tamala Julian  Chief Complaint  Patient presents with  . Coronary Artery Disease    Seen for Dr. Tamala Julian    History of Present Illness: Todd Romero is a 85 y.o. male who presents today for a follow up visit. Seen for Dr. Tamala Julian.   He has a history of AS --> TAVR 12/7423, chronic diastolic heart failure, CAD with CTO of D1 and 90% LCX (cath 2013), OSA not able to use CPAP, HTN, type 2 diabetes, PAF -on Eliquis and hyperlipidemia. He had PAF in the setting of acute on chronic diastolic heart failure in July of 2019 with a 26 pound diuresis followed by electrical conversion back to NSR. He has had noted reduction in LV function on last echo from 03/2018.   Last seen here in November by Dr. Tamala Julian - felt to be doing ok - did note some lower extremity swelling. EKG confirmed NSR with PVCs at that visit.   Comes in today. Here with his wife. He has had some mix up with his PPI therapy - did not have enough supply for the Prontonix, so has been using some Omeprazole. Weight is up. He does not weigh. Seems to have gotten lax again with salt restriction. Had knee surgery a year ago - has had some injury and has gotten more sedentary.  Having problems sleeping - asking what he can take. He sleeps in a recliner most nights - he does not tolerate his CPAP. He naps several times during the day - he falls asleep while watching TV - wife is worried about falling asleep while driving.  He admits he is short of breath if he lies flat. Some DOE but hard to say if this is worse/progressive.  His weight is continuing to climb. Remains pretty sedentary. Still with swelling in his legs - not as bad as what he had back in July.   Past Medical History:  Diagnosis Date  . Anemia   . Arthritis   . CAD (coronary artery disease)    a. cardiac cath 12/2016  showing moderate AS, elevated LVEDP, heavy 3V coronary calcification, 100% mD2, 30-50% LAD, 50-90% stenosis of Cx proximal to origin of L-PDA, RCA not engaged due to poor catheter control (difficult procedure) - coronary status was essentially unchanged from prior.  . Chronic diastolic CHF (congestive heart failure) ([place])   . Degenerative arthritis   . Diabetes ([place])   . Dyspnea   . Essential hypertension   . Hyperlipidemia   . New onset a-fib ([place]) 03/04/2018  . Obesity (BMI 30-39.9) 06/18/2015  . OSA (obstructive sleep apnea)    Severe with AHI 27/hr now on CPAP  no cpap  . Peritonitis ([place]) 1985?  [name] Pneumonia    "6 months - 33 years Romero"  . Pure hypercholesterolemia   . RBBB   . [facility] spotted fever   . 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 Severe aortic stenosis    a. s/p TAVR 03/2017.  . Type II or unspecified type diabetes mellitus without mention of complication, not stated as uncontrolled     Past Surgical History:  Procedure Laterality Date  . APPENDECTOMY    . CARDIOVERSION N/A 04/03/2018  Procedure: CARDIOVERSION;  Surgeon: Josue Hector, MD;  Location: [facility] ENDOSCOPY;  Service: Cardiovascular;  Laterality: N/A;  . CARPAL TUNNEL RELEASE    . COLECTOMY     partial  . COLONOSCOPY WITH PROPOFOL N/A 11/18/2017   Procedure: COLONOSCOPY WITH PROPOFOL;  Surgeon: Carol Ada, MD;  Location: [facility] ENDOSCOPY;  Service: Endoscopy;  Laterality: N/A;  . ESOPHAGOGASTRODUODENOSCOPY N/A 11/18/2017   Procedure: ESOPHAGOGASTRODUODENOSCOPY (EGD);  Surgeon: Carol Ada, MD;  Location: [facility] ENDOSCOPY;  Service: Endoscopy;  Laterality: N/A;  . EYE SURGERY Bilateral    cataracts removed, lens placed  . JOINT REPLACEMENT    . KNEE CARTILAGE SURGERY    . PARTIAL COLECTOMY    . RIGHT/LEFT HEART CATH AND CORONARY ANGIOGRAPHY N/A 12/28/2016   Procedure: Right/Left  Heart Cath and Coronary Angiography;  Surgeon: Belva Crome, MD;  Location: [place] CV LAB;  Service: Cardiovascular;  Laterality: N/A;  . TEE WITHOUT CARDIOVERSION N/A 03/15/2017   Procedure: TRANSESOPHAGEAL ECHOCARDIOGRAM (TEE);  Surgeon: Burnell Blanks, MD;  Location: [place];  Service: Open Heart Surgery;  Laterality: N/A;  . TOTAL KNEE ARTHROPLASTY Right 11/08/2017  . TOTAL KNEE ARTHROPLASTY Right 11/08/2017   Procedure: TOTAL KNEE ARTHROPLASTY;  Surgeon: Melrose Nakayama, MD;  Location: [place];  Service: Orthopedics;  Laterality: Right;  . TRANSCATHETER AORTIC VALVE REPLACEMENT, TRANSFEMORAL N/A 03/15/2017   Procedure: TRANSCATHETER AORTIC VALVE REPLACEMENT, TRANSFEMORAL;  Surgeon: Burnell Blanks, MD;  Location: [place];  Service: Open Heart Surgery;  Laterality: N/A;  . VEIN SURGERY       Medications: Current Meds  Medication Sig  . acetaminophen (TYLENOL) 500 MG tablet Take 500 mg by mouth every 4 (four) hours as needed for moderate pain or headache.  [name] apixaban (ELIQUIS) 5 MG TABS tablet Take 1 tablet (5 mg total) by mouth 2 (two) times daily.  . ferrous sulfate 325 (65 FE) MG tablet Take 325 mg by mouth 2 (two) times daily with a meal.   . fish oil-omega-3 fatty acids 1000 MG capsule Take 1 g by mouth daily at 12 noon.   . furosemide (LASIX) 80 MG tablet Take 1 tablet (80 mg total) by mouth 2 (two) times daily. Take an extra half tablet (40 mg) if weight goes up 2 pounds or more from day before.  [name] glimepiride (AMARYL) 2 MG tablet Take one tablet twice daily  . glucose blood test strip USE AS INSTRUCTED TO CHECK BLOOD SUGAR TWICE A DAY DX:E11.65  . insulin lispro (HUMALOG KWIKPEN) 100 UNIT/ML KiwkPen INJECT 4-8 UNITS BEFORE THE MAIN MEALS OF THE DAY  . insulin lispro (HUMALOG) 100 UNIT/ML injection Inject into the skin as directed.  . insulin NPH Human (NOVOLIN N) 100 UNIT/ML injection Inject 0.28 mLs (28 Units total) into the skin at bedtime.  . Insulin Pen Needle  (B-D UF III MINI PEN NEEDLES) 31G X 5 MM MISC USE 2 PEN NEEDLE PER DAY WITH HUMALOG AND VICTOZA  . metFORMIN (GLUMETZA) 500 MG (MOD) 24 hr tablet Take 1 tablet (500 mg total) by mouth 2 (two) times daily with a meal. (morning & noon)  . Multiple Vitamin (MULTIVITAMIN WITH MINERALS) TABS tablet Take 1 tablet by mouth daily. Mens One a day Vit  . Multiple Vitamins-Minerals (PRESERVISION AREDS 2 PO) Take 1 capsule by mouth 2 (two) times daily.  [name] olmesartan (BENICAR) 20 MG tablet Take 1 tablet (20 mg total) by mouth daily.  . pantoprazole (PROTONIX) 40 MG tablet Take 1 tablet (40 mg total) by mouth 2 (  two) times daily.  . pravastatin (PRAVACHOL) 80 MG tablet Take 1 tablet (80 mg total) by mouth every evening.  . tamsulosin (FLOMAX) 0.4 MG CAPS capsule Take 1 capsule daily  . VICTOZA 18 MG/3ML SOPN INJECT 1.8 MG UNDER THE SKIN AT BEDTIME  . vitamin C (ASCORBIC ACID) 500 MG tablet Take 500 mg by mouth daily.  . [DISCONTINUED] pantoprazole (PROTONIX) 40 MG tablet TAKE 1 TABLET BY MOUTH TWICE A DAY     Allergies: Allergies  Allergen Reactions  . Bactrim [Sulfamethoxazole-Trimethoprim] Nausea And Vomiting and Other (See Comments)    Bleeding and ulcers  . Morphine And Related Nausea And Vomiting    Social History: The patient  reports that he has never smoked. He has never used smokeless tobacco. He reports that he does not drink alcohol or use drugs.   Family History: The patient's family history includes Cancer in his sister; Emphysema in his father; Healthy in his sister; Heart failure in his father; Hypertension in his mother.   Review of Systems: Please see the history of present illness.   Otherwise, the review of systems is positive for none.   All other systems are reviewed and negative.   Physical Exam: VS:  BP 140/70 (BP Location: Left Arm, Patient Position: Sitting, Cuff Size: Normal)   Pulse (!) 55   Ht 5\' 8"  (1.727 m)   Wt 242 lb 6.4 oz (110 kg)   SpO2 97% Comment: at rest   BMI 36.86 kg/m  .  BMI Body mass index is 36.86 kg/m.  Wt Readings from Last 3 Encounters:  11/13/18 242 lb 6.4 oz (110 kg)  08/15/18 231 lb 6.4 oz (105 kg)  07/25/18 234 lb 9.6 oz (106.4 kg)    General: Pleasant. Jovial. Obese. Alert and in no acute distress.  His weight continues to climb.  HEENT: Normal.  Neck: Supple, no JVD, carotid bruits, or masses noted.  Cardiac: His rhythm is regular and then noted to be irregular on exam. Rate is fine. He has edema - moreso on the right - he says this is chronic.  Respiratory:  Lungs are fairly clear to auscultation bilaterally with normal work of breathing.  GI: Soft and nontender. Obese.  MS: No deformity or atrophy. Gait and ROM intact.  Skin: Warm and dry. Color is normal.  Neuro:  Strength and sensation are intact and no gross focal deficits noted.  Psych: Alert, appropriate and with normal affect.   LABORATORY DATA:  EKG:  EKG is ordered today. Reviewed with Dr. Rayann Heman here in the office - ? Return of AF - very difficult to discern. Rate is fine.   Lab Results  Component Value Date   WBC 6.8 11/13/2018   HGB 11.4 (L) 11/13/2018   HCT 34.8 (L) 11/13/2018   PLT 133.0 (L) 11/13/2018   GLUCOSE 120 (H) 11/13/2018   CHOL 134 04/07/2018   TRIG 124.0 04/07/2018   HDL 51.80 04/07/2018   LDLDIRECT 101.0 04/26/2016   LDLCALC 58 04/07/2018   ALT 15 11/13/2018   AST 19 11/13/2018   NA 137 11/13/2018   K 4.8 11/13/2018   CL 102 11/13/2018   CREATININE 1.40 11/13/2018   BUN 40 (H) 11/13/2018   CO2 26 11/13/2018   TSH 0.862 03/27/2018   INR 1.10 03/05/2018   HGBA1C 7.7 (H) 11/13/2018   MICROALBUR 22.8 (H) 10/31/2017     BNP (last 3 results) Recent Labs    03/04/18 1324  BNP 261.9*    ProBNP (last  3 results) Recent Labs    03/24/18 1100  PROBNP 1,849*     Other Studies Reviewed Today:  2D Doppler echocardiogram June 2019: Study Conclusions  - Left ventricle: The cavity size was normal. Wall thickness  was increased in a pattern of moderate LVH. The estimated ejection fraction was 43%. Diffuse hypokinesis. The study is not technically sufficient to allow evaluation of LV diastolic function. Ejection fraction (MOD, 2-plane): 43%. - Aortic valve: s/p TAVR valve. No obstruction. Trivial paravalvular leak. Mean gradient (S): 10 mm Hg. Peak gradient (S): 24 mm Hg. Valve area (VTI): 1.18 cm^2. Valve area (Vmax): 1.09 cm^2. Valve area (Vmean): 1.2 cm^2. - Left atrium: The atrium was mildly dilated. - Right ventricle: The cavity size was mildly dilated. - Atrial septum: Small atrial septal defect. - Tricuspid valve: There was moderate regurgitation. - Pulmonary arteries: PA peak pressure: 47 mm Hg (S). - Inferior vena cava: The vessel was dilated. The respirophasic diameter changes were blunted (<50%), consistent with elevated central venous pressure.  Impressions:  - Compared to a prior study in 04/2017, the LVEF is lower at 43% with global hypokinesis. The TAVR valve is non-obstructed with trivial perivalvular leak and has a mean gradient of 10 mmHg. A small atrial septal defect is noted. There is moderate TR with an RVSP of 47 mmHg and a dilated IVC.  Assessment/Plan:  1. CAD - no active chest pain noted.   2. Prior TAVR - for severe AS  3. HTN - BP fair - needs better salt restriction.   4. PAF - unclear to me and Dr. Rayann Heman if he is back in AF or not - will get a 24 hour Holter. Hard to say if he is back in Af now and that that is the reason for weight gain - he has gotten very sedentary and lax with salt restriction. Remains on anticoagulation. If in fact now in permanent AF, will need to discuss possible EP referral, AAD therapy, repeat cardioversion, etc.   5. Chronic combined systolic & diastolic HF - weight is up - will check BNP - hard to say if his current situation is all due to sedentary lifestyle, possible return of AF, etc. I do not get a  sense that this is all heart failure. Will check BNP. Checking 24 hour holter. Will increase Lasix for 3 days. Would like to review with Dr. Tamala Julian after Holter is obtained. Regardless, needs to get back to salt restriction and daily weights - he admits he has been lax. Checking BNP today.   6. HLD - on statin  7. OSA - not able to use CPAP - this certainly affects his overall situation.   8. Obesity - also playing a role in this situation.   9. CKD - improved by lab from earlier today.    Current medicines are reviewed with the patient today.  The patient does not have concerns regarding medicines other than what has been noted above.  The following changes have been made:  See above.  Labs/ tests ordered today include:    Orders Placed This Encounter  Procedures  . Pro b natriuretic peptide (BNP)  . HOLTER MONITOR - 24 HOUR  . EKG 12-Lead     Disposition:   FU with me in about 2 weeks. He is to see Dr. Tamala Julian in May.    Patient is agreeable to this plan and will call if any problems develop in the interim.   SignedTruitt Merle, NP  11/13/2018  2:27 PM  [facility] [address] [place], [place]  [address] Phone: [phone] Fax: [phone]

## 2018-11-14 LAB — PRO B NATRIURETIC PEPTIDE: NT-Pro BNP: 1698 pg/mL — ABNORMAL HIGH (ref 0–486)

## 2018-11-16 ENCOUNTER — Encounter: Payer: Self-pay | Admitting: Endocrinology

## 2018-11-16 ENCOUNTER — Other Ambulatory Visit: Payer: Self-pay

## 2018-11-16 ENCOUNTER — Ambulatory Visit (INDEPENDENT_AMBULATORY_CARE_PROVIDER_SITE_OTHER): Payer: Medicare Other | Admitting: Endocrinology

## 2018-11-16 VITALS — BP 124/50 | HR 79 | Ht 68.0 in | Wt 238.0 lb

## 2018-11-16 DIAGNOSIS — D508 Other iron deficiency anemias: Secondary | ICD-10-CM

## 2018-11-16 DIAGNOSIS — Z794 Long term (current) use of insulin: Secondary | ICD-10-CM

## 2018-11-16 DIAGNOSIS — N289 Disorder of kidney and ureter, unspecified: Secondary | ICD-10-CM

## 2018-11-16 DIAGNOSIS — I10 Essential (primary) hypertension: Secondary | ICD-10-CM | POA: Diagnosis not present

## 2018-11-16 DIAGNOSIS — E1165 Type 2 diabetes mellitus with hyperglycemia: Secondary | ICD-10-CM

## 2018-11-16 NOTE — Progress Notes (Signed)
Patient ID: Todd Romero, male   DOB: Jan 09, 1934, 85 y.o.   MRN: [id number]    Reason for Appointment:   Follow-up    History of Present Illness   Diagnosis: Type 2 DIABETES MELITUS, date of diagnosis:  1997   He has been on various regimens for his diabetes and has been on bedtime insulin with NPH since 2005 He also has benefited from adding Victoza in 2011 with better postprandial control and some weight loss Previously his weight has been as much as 247 pounds  On Victoza  since 11/14 with further improvement in blood sugar control. Since about 2015 his blood sugars have been overall mildly high with A1c over 7% consistently.  RECENT HISTORY:  His A1c is relatively higher at 7.7 compared to 7.1  Non-insulin hypoglycemic drugs: Amaryl 2 mg twice daily. and metformin 500 mg twice a day, Victoza 1.8 mg daily        Side effects from medications: None  Insulin regimen: NPH 26 units at bedtime and Humalog 12 units at suppertime    Current management, blood sugar patterns and problems identified:  He still has difficulty controlling his blood sugars after his evening meal  He feels that his blood sugars are not predictable based on what he is eating in the evening  However he is taking 2 units more Humalog overall than before  With his monitoring blood sugars only breakfast time and bedtime not clear how often he has high readings during the day  Even though his A1c is higher his average blood sugar at home is the same as on his last visit  He will sometimes eat fast food sandwiches at lunch or larger meal at lunch and then eat a smaller meal in the evening  Only when he is eating foods like pasta he will increase his suppertime dose by 2 units  Morning readings are excellent as before but he has had 2 readings in the 60s  Still not active without any exercise regimen  Some of his weight gain is from edema  Monitors blood glucose:about 2x a day.     Glucometer:  Accu-Chek         Blood Glucose readings from meter download:    PRE-MEAL Fasting Lunch Dinner Bedtime Overall  Glucose range:  64-150    92-264   Mean/median:  112    177  143   POST-MEAL PC Breakfast PC Lunch PC Dinner  Glucose range:     Mean/median:      Previous download:  PRE-MEAL Fasting Lunch Dinner Bedtime Overall  Glucose range:  74-124    127-302   Mean/median: 97    185  144   POST-MEAL PC Breakfast PC Lunch PC Dinner  Glucose range:   98   Mean/median:          Physical activity: exercise: None recently  Meal times: Dinner about 5-7 PM  Wt Readings from Last 3 Encounters:  11/16/18 238 lb (108 kg)  11/13/18 242 lb 6.4 oz (110 kg)  08/15/18 231 lb 6.4 oz (105 kg)   LABS: Lab Results  Component Value Date   HGBA1C 7.7 (H) 11/13/2018   HGBA1C 7.1 (H) 07/25/2018   HGBA1C 8.2 (H) 04/07/2018   Lab Results  Component Value Date   MICROALBUR 22.8 (H) 10/31/2017   LDLCALC 58 04/07/2018   CREATININE 1.40 11/13/2018       OTHER active problems are discussed in review of systems  Allergies as of 11/16/2018      Reactions   Bactrim [sulfamethoxazole-trimethoprim] Nausea And Vomiting, Other (See Comments)   Bleeding and ulcers   Morphine And Related Nausea And Vomiting      Medication List       Accurate as of November 16, 2018  4:43 PM. Always use your most recent med list.        acetaminophen 500 MG tablet Commonly known as:  TYLENOL Take 500 mg by mouth every 4 (four) hours as needed for moderate pain or headache.   apixaban 5 MG Tabs tablet Commonly known as:  Eliquis Take 1 tablet (5 mg total) by mouth 2 (two) times daily.   ferrous sulfate 325 (65 FE) MG tablet Take 325 mg by mouth 2 (two) times daily with a meal.   fish oil-omega-3 fatty acids 1000 MG capsule Take 1 g by mouth daily at 12 noon.   furosemide 80 MG tablet Commonly known as:  Lasix Take 1 tablet (80 mg total) by mouth 2 (two) times daily. Take an extra  half tablet (40 mg) if weight goes up 2 pounds or more from day before.   glimepiride 2 MG tablet Commonly known as:  AMARYL Take one tablet twice daily   glucose blood test strip USE AS INSTRUCTED TO CHECK BLOOD SUGAR TWICE A DAY DX:E11.65   HumaLOG KwikPen 100 UNIT/ML KwikPen Generic drug:  insulin lispro Inject 10-12 Units into the skin. Inject 10-12 units under the skin before or directly after the main meal of the day.   insulin NPH Human 100 UNIT/ML injection Commonly known as:  NovoLIN N Inject 0.28 mLs (28 Units total) into the skin at bedtime.   Insulin Pen Needle 31G X 5 MM Misc Commonly known as:  B-D UF III MINI PEN NEEDLES USE 2 PEN NEEDLE PER DAY WITH HUMALOG AND VICTOZA   metFORMIN 500 MG (MOD) 24 hr tablet Commonly known as:  GLUMETZA Take 1 tablet (500 mg total) by mouth 2 (two) times daily with a meal. (morning & noon)   multivitamin with minerals Tabs tablet Take 1 tablet by mouth daily. Mens One a day Vit   olmesartan 20 MG tablet Commonly known as:  BENICAR Take 1 tablet (20 mg total) by mouth daily.   pantoprazole 40 MG tablet Commonly known as:  PROTONIX Take 1 tablet (40 mg total) by mouth 2 (two) times daily.   pravastatin 80 MG tablet Commonly known as:  PRAVACHOL Take 1 tablet (80 mg total) by mouth every evening.   PRESERVISION AREDS 2 PO Take 1 capsule by mouth 2 (two) times daily.   tamsulosin 0.4 MG Caps capsule Commonly known as:  FLOMAX Take 1 capsule daily   Victoza 18 MG/3ML Sopn Generic drug:  liraglutide INJECT 1.8 MG UNDER THE SKIN AT BEDTIME   vitamin C 500 MG tablet Commonly known as:  ASCORBIC ACID Take 500 mg by mouth daily.       Allergies:  Allergies  Allergen Reactions  . Bactrim [Sulfamethoxazole-Trimethoprim] Nausea And Vomiting and Other (See Comments)    Bleeding and ulcers  . Morphine And Related Nausea And Vomiting    Past Medical History:  Diagnosis Date  . Anemia   . Arthritis   . CAD (coronary  artery disease)    a. cardiac cath 12/2016 showing moderate AS, elevated LVEDP, heavy 3V coronary calcification, 100% mD2, 30-50% LAD, 50-90% stenosis of Cx proximal to origin of L-PDA, RCA not engaged due to poor catheter  control (difficult procedure) - coronary status was essentially unchanged from prior.  . Chronic diastolic CHF (congestive heart failure) ([place])   . Degenerative arthritis   . Diabetes ([place])   . Dyspnea   . Essential hypertension   . Hyperlipidemia   . New onset a-fib ([place]) 03/04/2018  . Obesity (BMI 30-39.9) 06/18/2015  . OSA (obstructive sleep apnea)    Severe with AHI 27/hr now on CPAP  no cpap  . Peritonitis ([place]) 1985?  [name] Pneumonia    "6 months - 85 years old"  . Pure hypercholesterolemia   . RBBB   . [facility] [place] spotted fever   . 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 Severe aortic stenosis    a. s/p TAVR 03/2017.  . Type II or unspecified type diabetes mellitus without mention of complication, not stated as uncontrolled     Past Surgical History:  Procedure Laterality Date  . APPENDECTOMY    . CARDIOVERSION N/A 04/03/2018   Procedure: CARDIOVERSION;  Surgeon: Josue Hector, MD;  Location: [facility] ENDOSCOPY;  Service: Cardiovascular;  Laterality: N/A;  . CARPAL TUNNEL RELEASE    . COLECTOMY     partial  . COLONOSCOPY WITH PROPOFOL N/A 11/18/2017   Procedure: COLONOSCOPY WITH PROPOFOL;  Surgeon: Carol Ada, MD;  Location: [facility] ENDOSCOPY;  Service: Endoscopy;  Laterality: N/A;  . ESOPHAGOGASTRODUODENOSCOPY N/A 11/18/2017   Procedure: ESOPHAGOGASTRODUODENOSCOPY (EGD);  Surgeon: Carol Ada, MD;  Location: [facility] ENDOSCOPY;  Service: Endoscopy;  Laterality: N/A;  . EYE SURGERY Bilateral    cataracts removed, lens placed  . JOINT REPLACEMENT    . KNEE CARTILAGE SURGERY    . PARTIAL COLECTOMY    . RIGHT/LEFT HEART CATH AND CORONARY  ANGIOGRAPHY N/A 12/28/2016   Procedure: Right/Left Heart Cath and Coronary Angiography;  Surgeon: Belva Crome, MD;  Location: [place] CV LAB;  Service: Cardiovascular;  Laterality: N/A;  . TEE WITHOUT CARDIOVERSION N/A 03/15/2017   Procedure: TRANSESOPHAGEAL ECHOCARDIOGRAM (TEE);  Surgeon: Burnell Blanks, MD;  Location: [place];  Service: Open Heart Surgery;  Laterality: N/A;  . TOTAL KNEE ARTHROPLASTY Right 11/08/2017  . TOTAL KNEE ARTHROPLASTY Right 11/08/2017   Procedure: TOTAL KNEE ARTHROPLASTY;  Surgeon: Melrose Nakayama, MD;  Location: [place];  Service: Orthopedics;  Laterality: Right;  . TRANSCATHETER AORTIC VALVE REPLACEMENT, TRANSFEMORAL N/A 03/15/2017   Procedure: TRANSCATHETER AORTIC VALVE REPLACEMENT, TRANSFEMORAL;  Surgeon: Burnell Blanks, MD;  Location: [place];  Service: Open Heart Surgery;  Laterality: N/A;  . VEIN SURGERY      Family History  Problem Relation Age of Onset  . Heart failure Father   . Emphysema Father   . Hypertension Mother   . Cancer Sister   . Healthy Sister     Social History:  reports that he has never smoked. He has never used smokeless tobacco. He reports that he does not drink alcohol or use drugs.  Review of Systems:     HYPERTENSION:   Has been being treated with Benicar 20 mg Followed by cardiologist    BP Readings from Last 3 Encounters:  11/16/18 (!) 124/50  11/13/18 140/70  08/15/18 140/78    Lab Results  Component Value Date   CREATININE 1.40 11/13/2018   CREATININE 1.43 07/25/2018   CREATININE 1.65 (H) 06/06/2018    CARDIAC history:   No shortness of breath on exertion since his aortic valve replacement  EDEMA: He is on 80 mg twice daily of the Lasix dose  Recently told by cardiologist to increase the dose temporarily because of edema As high BNP He does have associated LV dysfunction  HYPERLIPIDEMIA: The lipid abnormality consists of elevated LDL and he is taking 80 mg Pravachol,  LDL is below 70  Triglycerides have been normal   Lab Results  Component Value Date   CHOL 134 04/07/2018   HDL 51.80 04/07/2018   LDLCALC 58 04/07/2018   LDLDIRECT 101.0 04/26/2016   TRIG 124.0 04/07/2018   CHOLHDL 3 04/07/2018     IRON deficiency anemia:   The hemoglobin is about the same despite taking iron twice a day  No history of B-12 deficiency Last Hemoccult was as below  CBC Latest Ref Rng & Units 11/13/2018 07/25/2018 06/06/2018  WBC 4.0 - 10.5 K/uL 6.8 6.7 7.0  Hemoglobin 13.0 - 17.0 g/dL 11.4(L) 11.4(L) 10.5(L)  Hematocrit 39.0 - 52.0 % 34.8(L) 34.0(L) 31.7(L)  Platelets 150.0 - 400.0 K/uL 133.0(L) 133.0(L) 151.0   Lab Results  Component Value Date   OCCULTBLD Negative 06/26/2018   OCCULTBLD POSITIVE (A) 11/16/2017   OCCULTBLD Negative 06/07/2017      ROS    Physical Exam    BP (!) 124/50 (BP Location: Left Arm, Patient Position: Sitting, Cuff Size: Large)   Pulse 79   Ht 5\' 8"  (1.727 m)   Wt 238 lb (108 kg)   SpO2 98%   BMI 36.19 kg/m   1+ lower leg edema present   ASSESSMENT/ PLAN:   Diabetes type 2 with obesity  See history of present illness for detailed discussion of his current management, blood sugar patterns and problems identified  His A1c is back up to 7.7 compared to 7.1  His blood sugar patterns, day-to-day management, diet, blood sugar targets and insulin adjustment was discussed in detail He likely has high readings after breakfast or lunch if he is eating more carbohydrate which he is not covering with Humalog Not clear if he is adequately adjusting his suppertime dose based on what he is eating Lowest blood sugars are fasting and occasionally in the 60s but not always symptomatic Currently not exercising but hopefully can start soon  Recommendations: He will increase his NovoLog by 2 to 4 units based on his total carbohydrate intake at suppertime Likely will need 8 to 10 units to cover lunch if he is eating sandwiches especially fast food  Increase metformin to 3 tablets daily and instead of taking this at breakfast and lunch will take 1 tablet in the morning and 2 at dinnertime Reduce NPH by 4 units and take 22 units   HYPERTENSION: Blood pressure is  well controlled  RENAL dysfunction: This is slightly better  EDEMA: Treated with taking additional Lasix recently  ANEMIA: Hemoglobin needs to be evaluated by PCP Continue iron  Counseling time on subjects discussed in assessment and plan sections is over 50% of today's 25 minute visit      [name] 11/16/2018, 4:43 PM   Patient Instructions  N insulin 22 U  Check blood sugars on waking up 4 days a week  Also check blood sugars about 2 hours after meals and do this after different meals by rotation  Recommended blood sugar levels on waking up are 90-130 and about 2 hours after meal is 130-160  Please bring your blood sugar monitor to each visit, thank you  Humalog 10-12 at lunch  Take 1 in am 2 Metformin at supper

## 2018-11-16 NOTE — Patient Instructions (Addendum)
N insulin 22 U  Check blood sugars on waking up 4 days a week  Also check blood sugars about 2 hours after meals and do this after different meals by rotation  Recommended blood sugar levels on waking up are 90-130 and about 2 hours after meal is 130-160  Please bring your blood sugar monitor to each visit, thank you  Humalog 10-12 at lunch  Take 1 in am 2 Metformin at supper

## 2018-11-21 ENCOUNTER — Telehealth: Payer: Self-pay | Admitting: Endocrinology

## 2018-11-21 NOTE — Telephone Encounter (Signed)
He can cut it back to 1 tablet with breakfast and 1 with dinnertime

## 2018-11-21 NOTE — Telephone Encounter (Signed)
Called pt and gave him MD message. PT verbalized understanding.

## 2018-11-21 NOTE — Telephone Encounter (Signed)
Please advise 

## 2018-11-21 NOTE — Telephone Encounter (Signed)
Patient called re: Metformin upped dosage from last visit. Patient would like to spread the dosage apart because right now medication is wreaking havoc (diahrea). Please call patient at ph# [phone] to advise.

## 2018-12-01 ENCOUNTER — Telehealth: Payer: Self-pay | Admitting: Endocrinology

## 2018-12-01 NOTE — Telephone Encounter (Signed)
Patient stated he is needing a new prescription sent to [facility] for his   VICTOZA 23 MG/3ML [facility]

## 2018-12-04 ENCOUNTER — Other Ambulatory Visit: Payer: Self-pay

## 2018-12-04 MED ORDER — LIRAGLUTIDE 18 MG/3ML ~~LOC~~ SOPN: PEN_INJECTOR | SUBCUTANEOUS | 2 refills | Status: DC

## 2018-12-04 NOTE — Telephone Encounter (Signed)
Rx sent 

## 2018-12-05 ENCOUNTER — Encounter: Payer: Self-pay | Admitting: Nurse Practitioner

## 2018-12-05 ENCOUNTER — Other Ambulatory Visit: Payer: Self-pay

## 2018-12-05 ENCOUNTER — Telehealth (INDEPENDENT_AMBULATORY_CARE_PROVIDER_SITE_OTHER): Payer: Medicare Other | Admitting: Nurse Practitioner

## 2018-12-05 DIAGNOSIS — E7849 Other hyperlipidemia: Secondary | ICD-10-CM | POA: Diagnosis not present

## 2018-12-05 DIAGNOSIS — I10 Essential (primary) hypertension: Secondary | ICD-10-CM | POA: Diagnosis not present

## 2018-12-05 DIAGNOSIS — I4819 Other persistent atrial fibrillation: Secondary | ICD-10-CM | POA: Diagnosis not present

## 2018-12-05 DIAGNOSIS — I5042 Chronic combined systolic (congestive) and diastolic (congestive) heart failure: Secondary | ICD-10-CM

## 2018-12-05 DIAGNOSIS — I2510 Atherosclerotic heart disease of native coronary artery without angina pectoris: Secondary | ICD-10-CM

## 2018-12-05 NOTE — Patient Instructions (Addendum)
After Visit Summary:  We will be checking the following labs today - NONE  Medication Instructions:    Continue with your current medicines.   Ok to take extra Lasix if needed as you have been doing.    If you need a refill on your cardiac medications before your next appointment, please call your pharmacy.     Testing/Procedures To Be Arranged:  N/A  Follow-Up:   See Dr. Tamala Julian in May or June    At [facility], you and your health needs are our priority.  As part of our continuing mission to provide you with exceptional heart care, we have created designated Provider Care Teams.  These Care Teams include your primary Cardiologist (physician) and Advanced Practice Providers (APPs -  Physician Assistants and Nurse Practitioners) who all work together to provide you with the care you need, when you need it.  Special Instructions:  . Keep restricting your salt . Monitor your BP for [place].  . Stay safe, social distancing and good hand hygiene.   Call the [place] office at [phone] if you have any questions, problems or concerns.

## 2018-12-05 NOTE — Progress Notes (Signed)
Virtual Visit via Telephone Note    Evaluation Performed:  Follow-up visit  This visit type was conducted due to national recommendations for restrictions regarding the COVID-19 Pandemic (e.g. social distancing).  This format is felt to be most appropriate for this patient at this time.  All issues noted in this document were discussed and addressed.  No physical exam was performed (except for noted visual exam findings with Video Visits).  Please refer to the patient's chart (MyChart message for video visits and phone note for telephone visits) for the patient's consent to telehealth for [facility].  Date:  12/05/2018   ID:  Todd Romero, DOB December 16, 1933, MRN [id number]  Patient Location:  Home  Provider location:   Home  PCP:  Ma Hillock, DO  Cardiologist:  Sinclair Grooms, [place] Electrophysiologist:  None   Chief Complaint:  Follow up from Holter/Concern for AF  History of Present Illness:    Todd Romero is a 85 y.o. male who presents via audio/video conferencing for a telehealth visit today. Seen for Dr. Tamala Julian.   He has a history of AS --> TAVR 10/9935, chronic diastolic heart failure, CAD with CTO of D1 and 90% LCX (cath 2013), OSA not able to use CPAP, HTN, type 2 diabetes, PAF -on Eliquis and hyperlipidemia. He had PAF in the setting of acute on chronic diastolic heart failure in July of 2019 with a 26 pound diuresis followed by electrical conversion back to NSR. He has had noted reduction in LV function on last echo from 03/2018.   Last seen here in November of by Dr. Tamala Julian - felt to be doing ok - did note some lower extremity swelling. EKG confirmed NSR with PVCs at that visit.   I then saw him earlier this month - weight was up - seemed to have gotten lax with salt restriction and was more sedentary. Chronic issues with sleeping. Not able to tolerate his CPAP. Short of breath with lying flat. EKG was difficult to discern - even reviewed with  Dr. Rayann Heman - ended up placing a holter - this confirms persistent AF.   The patient does not symptoms concerning for COVID-19 infection (fever, chills, cough, or new shortness of breath).   Phone call with patient today - he feels like he is doing ok. Says his breathing is pretty good - still gets a little short of breath if he lies totally flat - this improves with propping up on a pillow. He feels like his weight is stable. We did have him take extra Lasix at last visit - he notes no real response. He has been out in the yard and perhaps has been doing more. No palpitations whatsoever. Has no awareness of being in AF at all. No problems with bleeding/bruising. He is not dizzy. He has not had syncope.  Has not been checking his BP. No current issues with his medicines and he does not need refills at this time.   Prior CV studies:   The following studies were reviewed today:  24 Hour Holter Study Highlights 11/2018   Continuous atrial fibrillation, 100% of the duration of monitoring  Heart rate ranges between 40 and 111 bpm. No pauses greater than 2.3 seconds. Heart rates in the 40s occurred during sleep.  Isolated ventricular ectopy with total burden 4%  No sustained ventricular arrhythmias or significant tachycardia.     2D Doppler echocardiogram June 2019: Study Conclusions  - Left ventricle: The cavity size  was normal. Wall thickness was increased in a pattern of moderate LVH. The estimated ejection fraction was 43%. Diffuse hypokinesis. The study is not technically sufficient to allow evaluation of LV diastolic function. Ejection fraction (MOD, 2-plane): 43%. - Aortic valve: s/p TAVR valve. No obstruction. Trivial paravalvular leak. Mean gradient (S): 10 mm Hg. Peak gradient (S): 24 mm Hg. Valve area (VTI): 1.18 cm^2. Valve area (Vmax): 1.09 cm^2. Valve area (Vmean): 1.2 cm^2. - Left atrium: The atrium was mildly dilated. - Right ventricle: The cavity size was  mildly dilated. - Atrial septum: Small atrial septal defect. - Tricuspid valve: There was moderate regurgitation. - Pulmonary arteries: PA peak pressure: 47 mm Hg (S). - Inferior vena cava: The vessel was dilated. The respirophasic diameter changes were blunted (<50%), consistent with elevated central venous pressure.  Impressions:  - Compared to a prior study in 04/2017, the LVEF is lower at 43% with global hypokinesis. The TAVR valve is non-obstructed with trivial perivalvular leak and has a mean gradient of 10 mmHg. A small atrial septal defect is noted. There is moderate TR with an RVSP of 47 mmHg and a dilated IVC.   Past Medical History:  Diagnosis Date   Anemia    Arthritis    CAD (coronary artery disease)    a. cardiac cath 12/2016 showing moderate AS, elevated LVEDP, heavy 3V coronary calcification, 100% mD2, 30-50% LAD, 50-90% stenosis of Cx proximal to origin of L-PDA, RCA not engaged due to poor catheter control (difficult procedure) - coronary status was essentially unchanged from prior.   Chronic diastolic CHF (congestive heart failure) (HCC)    Degenerative arthritis    Diabetes ([place])    Dyspnea    Essential hypertension    Hyperlipidemia    New onset a-fib ([place]) 03/04/2018   Obesity (BMI 30-39.9) 06/18/2015   OSA (obstructive sleep apnea)    Severe with AHI 27/hr now on CPAP  no cpap   Peritonitis ([place]) 1985?   Pneumonia    "6 months - 85 years old"   Pure hypercholesterolemia    RBBB    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 s/p TAVR 03/2017.   Type II or unspecified type diabetes mellitus without mention of complication, not stated as uncontrolled    Past Surgical History:  Procedure Laterality Date   APPENDECTOMY     CARDIOVERSION N/A  04/03/2018   Procedure: CARDIOVERSION;  Surgeon: Josue Hector, MD;  Location: [facility];  Service: Cardiovascular;  Laterality: N/A;   CARPAL TUNNEL RELEASE     COLECTOMY     partial   COLONOSCOPY WITH PROPOFOL N/A 11/18/2017   Procedure: COLONOSCOPY WITH PROPOFOL;  Surgeon: Carol Ada, MD;  Location: [facility];  Service: Endoscopy;  Laterality: N/A;   ESOPHAGOGASTRODUODENOSCOPY N/A 11/18/2017   Procedure: ESOPHAGOGASTRODUODENOSCOPY (EGD);  Surgeon: Carol Ada, MD;  Location: [facility];  Service: Endoscopy;  Laterality: N/A;   EYE SURGERY Bilateral    cataracts removed, lens placed   JOINT REPLACEMENT     KNEE CARTILAGE SURGERY     PARTIAL COLECTOMY     RIGHT/LEFT HEART CATH AND CORONARY ANGIOGRAPHY N/A 12/28/2016   Procedure: Right/Left Heart Cath and Coronary Angiography;  Surgeon: Belva Crome, MD;  Location: [facility];  Service: Cardiovascular;  Laterality: N/A;   TEE WITHOUT  CARDIOVERSION N/A 03/15/2017   Procedure: TRANSESOPHAGEAL ECHOCARDIOGRAM (TEE);  Surgeon: Burnell Blanks, MD;  Location: [facility];  Service: Open Heart Surgery;  Laterality: N/A;   TOTAL KNEE ARTHROPLASTY Right 11/08/2017   TOTAL KNEE ARTHROPLASTY Right 11/08/2017   Procedure: TOTAL KNEE ARTHROPLASTY;  Surgeon: Melrose Nakayama, MD;  Location: [facility];  Service: Orthopedics;  Laterality: Right;   TRANSCATHETER AORTIC VALVE REPLACEMENT, TRANSFEMORAL N/A 03/15/2017   Procedure: TRANSCATHETER AORTIC VALVE REPLACEMENT, TRANSFEMORAL;  Surgeon: Burnell Blanks, MD;  Location: [facility];  Service: Open Heart Surgery;  Laterality: N/A;   VEIN SURGERY       No outpatient medications have been marked as taking for the 12/05/18 encounter (Telemedicine) with Burtis Junes, NP.     Allergies:   Bactrim [sulfamethoxazole-trimethoprim] and Morphine and related   Social History   Tobacco Use   Smoking status: Never Smoker   Smokeless tobacco: Never Used  Substance Use Topics     Alcohol use: No   Drug use: No     Family Hx: The patient's family history includes Cancer in his sister; Emphysema in his father; Healthy in his sister; Heart failure in his father; Hypertension in his mother.  ROS:   Please see the history of present illness.     All other systems reviewed and are negative.   Labs/Other Tests and Data Reviewed:    Recent Labs: 03/04/2018: B Natriuretic Peptide 261.9 03/27/2018: Magnesium 2.2; TSH 0.862 11/13/2018: ALT 15; BUN 40; Creatinine, Ser 1.40; Hemoglobin 11.4; NT-Pro BNP 1,698; Platelets 133.0; Potassium 4.8; Sodium 137   Recent Lipid Panel Lab Results  Component Value Date/Time   CHOL 134 04/07/2018 09:10 AM   TRIG 124.0 04/07/2018 09:10 AM   HDL 51.80 04/07/2018 09:10 AM   CHOLHDL 3 04/07/2018 09:10 AM   LDLCALC 58 04/07/2018 09:10 AM   LDLDIRECT 101.0 04/26/2016 09:15 AM    Wt Readings from Last 3 Encounters:  11/16/18 238 lb (108 kg)  11/13/18 242 lb 6.4 oz (110 kg)  08/15/18 231 lb 6.4 oz (105 kg)     Objective:    Vital Signs:  There were no vitals taken for this visit.   Alert. Responds appropriately. Not short of breath with conversation.   ASSESSMENT & PLAN:    1. CAD - no active chest pain - would favor medical management.   2. Prior TAVR - for severe AS - last echo noted.    3. HTN - BP not know at this time - he does have a way to monitor and will start checking.   4. PAF - last EKG difficult - now with Holter showing persistent AF - ? If this is the reason for his prior weight gain - he has gotten very sedentary and lax with salt restriction as well. He is already on chronic anticoagulation - he seems to not have any awareness of being in AF - for now will opt for continued therapy with rate control and anticoagulation. Did not seem interested in AAD therapy. He felt like at this time he was doing pretty well. Will hold on EP referral for AAD therapy/repeat cardioversion at this time.   5. Chronic  combined systolic & diastolic HF - uses extra Lasix prn. Reminded of salt restriction.   6. HLD - on statin therapy  7. OSA - not able to use CPAP - this certainly affects his overall situation.   8. Obesity - also playing a role in this situation. He actually as tried  to be more active with the current pandemic and is out working in the yard and trying to move more.   9. CKD - improved by lab from last visit noted.   10. COVID-19 Education: The signs and symptoms of COVID-19 were discussed with the patient and how to seek care for testing (follow up with PCP or arrange E-visit).  The importance of social distancing, staying at home and hand hygiene were discussed today.  Patient Risk:   After full review of this patient's clinical status, I feel that they are at least moderate risk at this time.  Time:   Today, I have spent 15 minutes with the patient with telehealth technology discussing the above issues.     Medication Adjustments/Labs and Tests Ordered: Current medicines are reviewed at length with the patient today.  Concerns regarding medicines are outlined above.  Tests Ordered: No orders of the defined types were placed in this encounter.  Medication Changes: No orders of the defined types were placed in this encounter.   Disposition:  Follow up with Dr. Tamala Julian - he had a recall in the system for May - will try to arrange for May or June.   Amie Critchley, NP  12/05/2018 1:58 PM    [facility]

## 2018-12-07 NOTE — Progress Notes (Signed)
Agree 

## 2018-12-08 NOTE — Progress Notes (Signed)
Noted  

## 2018-12-11 ENCOUNTER — Telehealth: Payer: Self-pay | Admitting: Nurse Practitioner

## 2018-12-11 NOTE — Telephone Encounter (Signed)
See my phone note. 

## 2018-12-11 NOTE — Telephone Encounter (Signed)
Over the weekend the patient had a spell of SOB and feeling week all over and had gas. Daughter states that he is doing better this morning but still feeling very tired. They were concerned during his spell and the daughter would like to speak with Cecille Rubin to get advice.

## 2018-12-11 NOTE — Telephone Encounter (Signed)
Spoke to patient's wife this afternoon. She had called earlier today regarding a "spell" over the weekend. Todd Romero was outside working in his yard, came inside - started looking at the mail and then started feeling "poorly". He notes it was "heat exhaustion". He could not catch his breath. Took several hours to pass. BP was 150/78 and HR was 60. He had no frank syncope. No chest pain. Did not wish to call EMS and did not wish to go to the ER for fear of COVID 19.   Has felt fine since - no recurrence - have advised hydration, continuing to monitor BP and HR. If has recurrence they can call EMS without having to proceed to the ER and/or call the office to speak to the physician on call.   Verbalized understanding and will call us if needed - otherwise will see back as planned.   Burtis Junes, RN, [place] [address] [place], [place]  [phone]

## 2018-12-18 ENCOUNTER — Telehealth: Payer: Self-pay | Admitting: Nurse Practitioner

## 2018-12-18 ENCOUNTER — Telehealth: Payer: Self-pay | Admitting: Interventional Cardiology

## 2018-12-18 ENCOUNTER — Telehealth: Payer: Self-pay

## 2018-12-18 ENCOUNTER — Other Ambulatory Visit: Payer: Self-pay

## 2018-12-18 DIAGNOSIS — I5042 Chronic combined systolic (congestive) and diastolic (congestive) heart failure: Secondary | ICD-10-CM

## 2018-12-18 NOTE — Telephone Encounter (Signed)
Spoke to the patient's wife informing her of the patient's lab appt in our office 4/14.  She verbalized understanding.

## 2018-12-18 NOTE — Telephone Encounter (Signed)
The longer he stays in AF the less likely rhythm control will be helpful. I recommend an EP opinion before setting forth with cardioversion.

## 2018-12-18 NOTE — Telephone Encounter (Signed)
New Message   Patients wife is calling with a variety of questions pertaining to her spouse. She states that he has been in afib. Feeling fatigue. She also states that he has doubled his Lasix and would like to know when he will have lab work to see if the increase is affecting his kidneys. Please call to discuss.

## 2018-12-18 NOTE — Telephone Encounter (Signed)
Phone call today with patient's wife. Had spoken to her last week as well. She is concerned about his ongoing fatigue - says he is "walking like he is 85 years old". He chronically sleeps in the recliner due to dyspnea. He has been pretty sedentary over the past week. She thinks his weight is up - says 235 - but weighed 242 when actually in the office on 11/13/2018. Has chronic swelling - not sure if it is worse. Has used extra lasix for the past 4 days - no real response. Now worried about kidney function. BP has been good over the past few days and HR has been stable. He has had no more profound "weak spells" as like the last phone call.   Will check lab in the office tomorrow. BMET CBC and BNP prior to making medicine adjustments.   Unclear how much his AF is playing a role or if this is multifactorial with being sedentary/obese/CHF, etc. May have to reconsider trying to restore NSR with AAD therapy +/- cardioversion but would try to continue with medical management in light of current COVID crisis.   Original plan was for rate control and continue anticoagulation.   Further disposition to follow. Will route to Dr. Tamala Julian as well as Juluis Rainier.   Burtis Junes, RN, [place] [address] [place], [place]  [phone]

## 2018-12-19 ENCOUNTER — Other Ambulatory Visit: Payer: Medicare Other

## 2018-12-19 ENCOUNTER — Other Ambulatory Visit: Payer: Self-pay

## 2018-12-19 DIAGNOSIS — I5042 Chronic combined systolic (congestive) and diastolic (congestive) heart failure: Secondary | ICD-10-CM

## 2018-12-19 LAB — BASIC METABOLIC PANEL
BUN/Creatinine Ratio: 32 — ABNORMAL HIGH (ref 10–24)
BUN: 67 mg/dL — ABNORMAL HIGH (ref 8–27)
CO2: 17 mmol/L — ABNORMAL LOW (ref 20–29)
Calcium: 9.3 mg/dL (ref 8.6–10.2)
Chloride: 100 mmol/L (ref 96–106)
Creatinine, Ser: 2.08 mg/dL — ABNORMAL HIGH (ref 0.76–1.27)
GFR calc Af Amer: 33 mL/min/{1.73_m2} — ABNORMAL LOW (ref >59)
GFR calc non Af Amer: 29 mL/min/{1.73_m2} — ABNORMAL LOW (ref >59)
Glucose: 156 mg/dL — ABNORMAL HIGH (ref 65–99)
Potassium: 4.8 mmol/L (ref 3.5–5.2)
Sodium: 137 mmol/L (ref 134–144)

## 2018-12-19 LAB — PRO B NATRIURETIC PEPTIDE: NT-Pro BNP: 3089 pg/mL — ABNORMAL HIGH (ref 0–486)

## 2018-12-19 LAB — CBC
Hematocrit: 33.3 % — ABNORMAL LOW (ref 37.5–51.0)
Hemoglobin: 10.7 g/dL — ABNORMAL LOW (ref 13.0–17.7)
MCH: 29.3 pg (ref 26.6–33.0)
MCHC: 32.1 g/dL (ref 31.5–35.7)
MCV: 91 fL (ref 79–97)
Platelets: 197 10*3/uL (ref 150–450)
RBC: 3.65 x10E6/uL — ABNORMAL LOW (ref 4.14–5.80)
RDW: 14.8 % (ref 11.6–15.4)
WBC: 9.6 10*3/uL (ref 3.4–10.8)

## 2018-12-21 ENCOUNTER — Telehealth: Payer: Self-pay

## 2018-12-21 NOTE — Telephone Encounter (Signed)
Spoke with pt regarding appt on 12/22/18. Pt was advise to check vitals day of appt. Pt he did not have any concerns at he the time.

## 2018-12-22 ENCOUNTER — Encounter: Payer: Self-pay | Admitting: Internal Medicine

## 2018-12-22 ENCOUNTER — Telehealth (INDEPENDENT_AMBULATORY_CARE_PROVIDER_SITE_OTHER): Payer: Medicare Other | Admitting: Internal Medicine

## 2018-12-22 ENCOUNTER — Telehealth: Payer: Self-pay | Admitting: Cardiology

## 2018-12-22 VITALS — BP 123/65 | HR 55 | Wt 235.0 lb

## 2018-12-22 DIAGNOSIS — G4733 Obstructive sleep apnea (adult) (pediatric): Secondary | ICD-10-CM

## 2018-12-22 DIAGNOSIS — I10 Essential (primary) hypertension: Secondary | ICD-10-CM | POA: Diagnosis not present

## 2018-12-22 DIAGNOSIS — I4819 Other persistent atrial fibrillation: Secondary | ICD-10-CM

## 2018-12-22 DIAGNOSIS — I5043 Acute on chronic combined systolic (congestive) and diastolic (congestive) heart failure: Secondary | ICD-10-CM

## 2018-12-22 MED ORDER — AMIODARONE HCL 200 MG PO TABS: 200.0000 mg | ORAL_TABLET | Freq: Two times a day (BID) | ORAL | 3 refills | Status: DC

## 2018-12-22 NOTE — Progress Notes (Signed)
Electrophysiology TeleHealth Note   Due to national recommendations of social distancing due to COVID 19, an audio/video telehealth visit is felt to be most appropriate for this patient at this time.  See MyChart message from today for the patient's consent to telehealth for [facility].   Date:  12/22/2018   ID:  Todd Romero, DOB 03/03/1934, MRN [id number]  Location: patient's home  Provider location: [address] [place]  Evaluation Performed: Follow-up visit  PCP:  Ma Hillock, DO  Cardiologist:  Sinclair Grooms, MD Electrophysiologist:  Dr Rayann Heman  Chief Complaint:  afib  History of Present Illness:    Todd Romero is a 85 y.o. male who presents via audio/video conferencing for a telehealth visit today.  He is referred by Dr Tamala Julian and Truitt Merle for EP evaluation of AF.  He reports initially being diagnosed with atrial fibrillation 03/2018 after presenting with CHF.  He required diuresis/ medical management.  He was placed on eliquis and cardioverted.  He did well until March when he began having progressive SOB.  He has had worsening SOB and has been sleeping in a recliner.  + nausea and anhedonia. He wore an event monitor in March (personally ) reviewed which showed 100% wit controlled V rates. He has not tried AAD therapy.  He is recently having progressive renal failure, SOB, and fatigue. He has severe OSA but does not use his CPAP.  He has seen Dr Radford Pax in the past.  Since last being seen in our clinic, the patient reports doing very well.  Today, he denies symptoms of palpitations, chest pain,  lower extremity edema, dizziness, presyncope, or syncope.  The patient is otherwise without complaint today.  The patient denies symptoms of fevers, chills, cough, or new SOB worrisome for COVID 19.  Past Medical History:  Diagnosis Date  . Anemia   . Arthritis   . CAD (coronary artery disease)    a. cardiac cath 12/2016 showing moderate  AS, elevated LVEDP, heavy 3V coronary calcification, 100% mD2, 30-50% LAD, 50-90% stenosis of Cx proximal to origin of L-PDA, RCA not engaged due to poor catheter control (difficult procedure) - coronary status was essentially unchanged from prior.  . Chronic diastolic CHF (congestive heart failure) ([place])   . Degenerative arthritis   . Diabetes ([place])   . Dyspnea   . Essential hypertension   . Hyperlipidemia   . New onset a-fib ([place]) 03/04/2018  . Obesity (BMI 30-39.9) 06/18/2015  . OSA (obstructive sleep apnea)    Severe with AHI 27/hr now on CPAP  not compliant with treatment.  . Peritonitis ([place]) 1985?  [name] Pneumonia    "6 months - 85 years old"  . Pure hypercholesterolemia   . RBBB   . [facility] spotted fever   . 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 Severe aortic stenosis    a. s/p TAVR 03/2017.  . Type II or unspecified type diabetes mellitus without mention of complication, not stated as uncontrolled     Past Surgical History:  Procedure Laterality Date  . APPENDECTOMY    . CARDIOVERSION N/A 04/03/2018   Procedure: CARDIOVERSION;  Surgeon: Josue Hector, MD;  Location: [facility] ENDOSCOPY;  Service: Cardiovascular;  Laterality: N/A;  . CARPAL TUNNEL RELEASE    . COLECTOMY     partial  . COLONOSCOPY WITH PROPOFOL  N/A 11/18/2017   Procedure: COLONOSCOPY WITH PROPOFOL;  Surgeon: Carol Ada, MD;  Location: [facility] ENDOSCOPY;  Service: Endoscopy;  Laterality: N/A;  . ESOPHAGOGASTRODUODENOSCOPY N/A 11/18/2017   Procedure: ESOPHAGOGASTRODUODENOSCOPY (EGD);  Surgeon: Carol Ada, MD;  Location: [facility] ENDOSCOPY;  Service: Endoscopy;  Laterality: N/A;  . EYE SURGERY Bilateral    cataracts removed, lens placed  . JOINT REPLACEMENT    . KNEE CARTILAGE SURGERY    . PARTIAL COLECTOMY    . RIGHT/LEFT HEART CATH AND CORONARY ANGIOGRAPHY N/A 12/28/2016   Procedure:  Right/Left Heart Cath and Coronary Angiography;  Surgeon: Belva Crome, MD;  Location: [facility] CV LAB;  Service: Cardiovascular;  Laterality: N/A;  . TEE WITHOUT CARDIOVERSION N/A 03/15/2017   Procedure: TRANSESOPHAGEAL ECHOCARDIOGRAM (TEE);  Surgeon: Burnell Blanks, MD;  Location: [place];  Service: Open Heart Surgery;  Laterality: N/A;  . TOTAL KNEE ARTHROPLASTY Right 11/08/2017  . TOTAL KNEE ARTHROPLASTY Right 11/08/2017   Procedure: TOTAL KNEE ARTHROPLASTY;  Surgeon: Melrose Nakayama, MD;  Location: [place];  Service: Orthopedics;  Laterality: Right;  . TRANSCATHETER AORTIC VALVE REPLACEMENT, TRANSFEMORAL N/A 03/15/2017   Procedure: TRANSCATHETER AORTIC VALVE REPLACEMENT, TRANSFEMORAL;  Surgeon: Burnell Blanks, MD;  Location: [place];  Service: Open Heart Surgery;  Laterality: N/A;  . VEIN SURGERY      Current Outpatient Medications  Medication Sig Dispense Refill  . acetaminophen (TYLENOL) 500 MG tablet Take 500 mg by mouth every 4 (four) hours as needed for moderate pain or headache.    [name] apixaban (ELIQUIS) 5 MG TABS tablet Take 1 tablet (5 mg total) by mouth 2 (two) times daily. 180 tablet 3  . ferrous sulfate 325 (65 FE) MG tablet Take 325 mg by mouth 2 (two) times daily with a meal.     . fish oil-omega-3 fatty acids 1000 MG capsule Take 1 g by mouth daily at 12 noon.     . furosemide (LASIX) 80 MG tablet Take 1 tablet (80 mg total) by mouth 2 (two) times daily. Take an extra half tablet (40 mg) if weight goes up 2 pounds or more from day before. 60 tablet 11  . glimepiride (AMARYL) 2 MG tablet Take one tablet twice daily 180 tablet 4  . glucose blood test strip USE AS INSTRUCTED TO CHECK BLOOD SUGAR TWICE A DAY DX:E11.65 100 each 2  . insulin lispro (HUMALOG KWIKPEN) 100 UNIT/ML KwikPen Inject 10-12 Units into the skin. Inject 10-12 units under the skin before or directly after the main meal of the day.    . insulin NPH Human (NOVOLIN N) 100 UNIT/ML injection Inject 0.28  mLs (28 Units total) into the skin at bedtime. (Patient taking differently: Inject 26 Units into the skin at bedtime. Inject 26 units under the skin once daily at bedtime.) 30 mL 3  . Insulin Pen Needle (B-D UF III MINI PEN NEEDLES) 31G X 5 MM MISC USE 2 PEN NEEDLE PER DAY WITH HUMALOG AND VICTOZA 200 each 3  . liraglutide (VICTOZA) 18 MG/3ML SOPN INJECT 1.8 MG UNDER THE SKIN AT BEDTIME 9 pen 2  . metFORMIN (GLUMETZA) 500 MG (MOD) 24 hr tablet Take 1 tablet (500 mg total) by mouth 2 (two) times daily with a meal. (morning & noon) 180 tablet 4  . Multiple Vitamin (MULTIVITAMIN WITH MINERALS) TABS tablet Take 1 tablet by mouth daily. Mens One a day Vit    . Multiple Vitamins-Minerals (PRESERVISION AREDS 2 PO) Take 1 capsule by mouth 2 (two) times daily.    [name]  olmesartan (BENICAR) 20 MG tablet Take 1 tablet (20 mg total) by mouth daily. 90 tablet 3  . pantoprazole (PROTONIX) 40 MG tablet Take 1 tablet (40 mg total) by mouth 2 (two) times daily. 180 tablet 3  . pravastatin (PRAVACHOL) 80 MG tablet Take 1 tablet (80 mg total) by mouth every evening. 90 tablet 1  . tamsulosin (FLOMAX) 0.4 MG CAPS capsule Take 1 capsule daily 90 capsule 1  . vitamin C (ASCORBIC ACID) 500 MG tablet Take 500 mg by mouth daily.     No current facility-administered medications for this visit.     Allergies:   Bactrim [sulfamethoxazole-trimethoprim] and Morphine and related   Social History:  The patient  reports that he has never smoked. He has never used smokeless tobacco. He reports that he does not drink alcohol or use drugs.   Family History:  The patient's  family history includes Cancer in his sister; Emphysema in his father; Healthy in his sister; Heart failure in his father; Hypertension in his mother.   ROS:  Please see the history of present illness.   All other systems are personally reviewed and negative.    Exam:    Vital Signs:  BP 123/65   Pulse (!) 55   Wt 235 lb (106.6 kg)   BMI 35.73 kg/m   Well  appearing, alert and conversant, regular work of breathing,  good skin color Eyes- anicteric, neuro- grossly intact, skin- no apparent rash or lesions or cyanosis, mouth- oral mucosa is pink   Labs/Other Tests and Data Reviewed:    Recent Labs: 03/04/2018: B Natriuretic Peptide 261.9 03/27/2018: Magnesium 2.2; TSH 0.862 11/13/2018: ALT 15 12/19/2018: BUN 67; Creatinine, Ser 2.08; Hemoglobin 10.7; NT-Pro BNP 3,089; Platelets 197; Potassium 4.8; Sodium 137   Wt Readings from Last 3 Encounters:  12/22/18 235 lb (106.6 kg)  11/16/18 238 lb (108 kg)  11/13/18 242 lb 6.4 oz (110 kg)     Other studies personally reviewed: Additional studies/ records that were reviewed today include: Cecille Rubin Gerhardt's notes, Dr Darliss Ridgel prior notes,  Dr Landis Gandy prior notes Review of the above records today demonstrates: as above Echo 03/05/18- EF 43%, mild LA enlargement, moderate TR   ASSESSMENT & PLAN:    1.  Persistent afib Symptomatic with worsening CHF I worry that he is at risk for further decompensation and hospitalization. We discussed options at length today.  Given renal failure and CHF, his medicine options are limited to amiodarone.  I would not advise ablation as first line for this patient with untreated sleep apnea, ongoing CHF symptoms, and advanced age. He reports appropriate anticoagulation without interruption. I will therefore start amiodarone 200mg  BID at this time.  After 4 weeks, this should be reduced to 200mg  daily He will likely require cardioversion.  I would like to load with oral amiodarone as an outpatient prior to cardioversion.  I will have the AF clinic follow him closely over the next few weeks and arrange cardioversion in 2-4 weeks depending on symptoms and also COVID 19 status.  I would not wait longer than 4 weeks given his worsening CHF symptoms.  2. OSA Severe and not compliant with CPAP I worry that our ability to manage AF is limtied by his untreated sleep apnea.  I will  reach out to Dr Radford Pax to see if she has options for this patient.  3. Acute on chronic combined systolic and diastolic CHF We should try to restrore sinus rhythm as above Complicated by worsening renal  failure Continue lasix 2 gram sodium diet also discussed today May require expedited cardioversion  4. COVID 19 screen The patient denies symptoms of COVID 19 at this time.  The importance of social distancing was discussed today.  Follow-up:  AF clinic in 1week and also closely with Dr Tamala Julian May benefit from Telehealth visit with Dr Radford Pax for sleep apnea management also  Current medicines are reviewed at length with the patient today.   The patient does not have concerns regarding his medicines.  The following changes were made today:  none    Patient Risk:  after full review of this patients clinical status, I feel that they are at highrisk at this time.  Today, I have spent 30 minutes with the patient with telehealth technology discussing afib and CHF .    Army Fossa, MD  12/22/2018 11:58 AM     [facility] [address] [place] [address] [phone] (office) [phone] (fax)

## 2018-12-22 NOTE — Progress Notes (Signed)
Thanks James!

## 2018-12-22 NOTE — Telephone Encounter (Signed)
Received page from pt's wife.  Pt has been SOB and putting on fluid weight, as much as 5 lbs in the last day.  He is also in AF, had a telehealth visit with Dr. Rayann Heman today and likely planning for rhythm control strategy in the coming weeks.  He has been taking Lasix 80 mg BID, and has been previously been instructed to take an extra 40 mg PRN.  I advised him to take an extra 40 mg in addition to his 80 mg BID over the next 3 days and call the clinic on Monday AM for further advice.  He is adamant in not wanting to go to the ED given current COVID situation.  I advised that if his symptoms do not improved with increased diuretic and fluid restriction, that ED visit should be considered.  The pt and wife agree with this plan.  Doylene Canning, MD

## 2018-12-25 ENCOUNTER — Telehealth: Payer: Self-pay | Admitting: Internal Medicine

## 2018-12-25 MED ORDER — METOLAZONE 5 MG PO TABS: 5.0000 mg | ORAL_TABLET | Freq: Every day | ORAL | 0 refills | Status: DC

## 2018-12-25 NOTE — Telephone Encounter (Signed)
Spoke with wife and made her aware of recommendations.  They would like to try the Metolazone and call me tomorrow with how he is doing so we can go from there.  Gave directions for appropriate administration of Metolazone.  Wife verbalized understanding and was in agreement with plan.

## 2018-12-25 NOTE — Telephone Encounter (Signed)
He is developing diuretic resistance. He needs admission or try Metolazone 5 mg 30-60 minutes prior to next Furosemide dose. If tries this and it does not work will need to go to ER or come to office on Wed when I am DOD for admission.

## 2018-12-25 NOTE — Telephone Encounter (Signed)
Follow Up:   Pt had to call on Friday night and talked to the doctor on call. He was told to increase his Lasix to 40 more mg, which made him take 200 mg. Today he was supposed to call back to the office today and find out what he needs to do. He also started taking Amiodarone on Friday night. Pt still have a lot of fluid in his lower abdomen.o

## 2018-12-25 NOTE — Progress Notes (Addendum)
Virtual Visit via Video Note   This visit type was conducted due to national recommendations for restrictions regarding the COVID-19 Pandemic (e.g. social distancing) in an effort to limit this patient's exposure and mitigate transmission in our community.  Due to his co-morbid illnesses, this patient is at least at moderate risk for complications without adequate follow up.  This format is felt to be most appropriate for this patient at this time.  All issues noted in this document were discussed and addressed.  A limited physical exam was performed with this format.  Please refer to the patient's chart for his consent to telehealth for [facility].  Evaluation Performed:  Follow-up visit  This visit type was conducted due to national recommendations for restrictions regarding the COVID-19 Pandemic (e.g. social distancing).  This format is felt to be most appropriate for this patient at this time.  All issues noted in this document were discussed and addressed.  No physical exam was performed (except for noted visual exam findings with Video Visits).  Please refer to the patient's chart (MyChart message for video visits and phone note for telephone visits) for the patient's consent to telehealth for [facility].  Date:  12/26/2018   ID:  Todd Romero, DOB 12/02/1933, MRN [id number]  Patient Location:  Home  Provider location:   [place]  PCP:  Ma Hillock, DO  Cardiologist:  Sinclair Grooms, MD  Sleep Medicine:  Fransico Him, MD Electrophysiologist:  None   Chief Complaint:  OSA  History of Present Illness:    Todd Romero is a 85 y.o. male who presents via audio/video conferencing for a telehealth visit today.    Todd Romero is a 85 y.o. male  who presents for followup of OSA. He has severe OSA with an AHI of 27/hr with most events occurring in REM sleep in the non supine position. Oxygen saturations dropped to 79%. He was supposed to be  on  CPAP at 11cm H2O. He has not seen me for several years and apparently stopped using his PAP device.  He is referred back by Dr. Rayann Heman because he has had problems with afib and maintaining NSR as well as volume overload felt highly related to poorly controlled severe OSA.  He says that when he wears a fullface mask he felt like it was too much pressure and he switched to a nasal pillow mask and felt like he was not getting enough pressure and was suffocating because he is a mouth breather.  He got so frustrated with his device that he finally sent it back a year ago.  He says that he does sleep poorly throughout the night partly because he has to get up to urinate.  He feels fatigued in the morning and still tired and does nap during the day.  His wife says he does not really snore much.  He tells me that he may be going back to the hospital tomorrow because of volume overload for diuresis.  The patient does not have symptoms concerning for COVID-19 infection (fever, chills, cough, or new shortness of breath).    Prior CV studies:   The following studies were reviewed today:  PAP compliance download  Past Medical History:  Diagnosis Date  . Anemia   . Arthritis   . CAD (coronary artery disease)    a. cardiac cath 12/2016 showing moderate AS, elevated LVEDP, heavy 3V coronary calcification, 100% mD2, 30-50% LAD, 50-90% stenosis of Cx proximal to  origin of L-PDA, RCA not engaged due to poor catheter control (difficult procedure) - coronary status was essentially unchanged from prior.  . Chronic diastolic CHF (congestive heart failure) ([place])   . Degenerative arthritis   . Diabetes ([place])   . Dyspnea   . Essential hypertension   . Hyperlipidemia   . New onset a-fib ([place]) 03/04/2018  . Obesity (BMI 30-39.9) 06/18/2015  . OSA (obstructive sleep apnea)    Severe with AHI 27/hr now on CPAP  not compliant with treatment.  . Peritonitis ([place]) 1985?  [name] Pneumonia    "6 months - 85 years old"  . Pure  hypercholesterolemia   . RBBB   . [facility] spotted fever   . 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 Severe aortic stenosis    a. s/p TAVR 03/2017.  . Type II or unspecified type diabetes mellitus without mention of complication, not stated as uncontrolled    Past Surgical History:  Procedure Laterality Date  . APPENDECTOMY    . CARDIOVERSION N/A 04/03/2018   Procedure: CARDIOVERSION;  Surgeon: Josue Hector, MD;  Location: [facility] ENDOSCOPY;  Service: Cardiovascular;  Laterality: N/A;  . CARPAL TUNNEL RELEASE    . COLECTOMY     partial  . COLONOSCOPY WITH PROPOFOL N/A 11/18/2017   Procedure: COLONOSCOPY WITH PROPOFOL;  Surgeon: Carol Ada, MD;  Location: [facility] ENDOSCOPY;  Service: Endoscopy;  Laterality: N/A;  . ESOPHAGOGASTRODUODENOSCOPY N/A 11/18/2017   Procedure: ESOPHAGOGASTRODUODENOSCOPY (EGD);  Surgeon: Carol Ada, MD;  Location: [facility] ENDOSCOPY;  Service: Endoscopy;  Laterality: N/A;  . EYE SURGERY Bilateral    cataracts removed, lens placed  . JOINT REPLACEMENT    . KNEE CARTILAGE SURGERY    . PARTIAL COLECTOMY    . RIGHT/LEFT HEART CATH AND CORONARY ANGIOGRAPHY N/A 12/28/2016   Procedure: Right/Left Heart Cath and Coronary Angiography;  Surgeon: Belva Crome, MD;  Location: [place] CV LAB;  Service: Cardiovascular;  Laterality: N/A;  . TEE WITHOUT CARDIOVERSION N/A 03/15/2017   Procedure: TRANSESOPHAGEAL ECHOCARDIOGRAM (TEE);  Surgeon: Burnell Blanks, MD;  Location: [place];  Service: Open Heart Surgery;  Laterality: N/A;  . TOTAL KNEE ARTHROPLASTY Right 11/08/2017  . TOTAL KNEE ARTHROPLASTY Right 11/08/2017   Procedure: TOTAL KNEE ARTHROPLASTY;  Surgeon: Melrose Nakayama, MD;  Location: [place];  Service: Orthopedics;  Laterality: Right;  . TRANSCATHETER AORTIC VALVE REPLACEMENT, TRANSFEMORAL N/A 03/15/2017   Procedure: TRANSCATHETER AORTIC  VALVE REPLACEMENT, TRANSFEMORAL;  Surgeon: Burnell Blanks, MD;  Location: [place];  Service: Open Heart Surgery;  Laterality: N/A;  . VEIN SURGERY       Current Meds  Medication Sig  . acetaminophen (TYLENOL) 500 MG tablet Take 500 mg by mouth every 4 (four) hours as needed for moderate pain or headache.  [name] amiodarone (PACERONE) 200 MG tablet Take 1 tablet (200 mg total) by mouth 2 (two) times daily.  [name] apixaban (ELIQUIS) 5 MG TABS tablet Take 1 tablet (5 mg total) by mouth 2 (two) times daily.  . ferrous sulfate 325 (65 FE) MG tablet Take 325 mg by mouth 2 (two) times daily with a meal.   . fish oil-omega-3 fatty acids 1000 MG capsule Take 1 g by mouth daily at 12 noon.   . furosemide (LASIX) 80 MG tablet Take 1 tablet (80 mg total) by mouth 2 (two) times daily. Take an extra half tablet (40 mg) if  weight goes up 2 pounds or more from day before.  [name] glimepiride (AMARYL) 2 MG tablet Take one tablet twice daily  . glucose blood test strip USE AS INSTRUCTED TO CHECK BLOOD SUGAR TWICE A DAY DX:E11.65  . insulin lispro (HUMALOG KWIKPEN) 100 UNIT/ML KwikPen Inject 12-14 Units into the skin. Inject 10-12 units under the skin before or directly after the main meal of the day.   . insulin NPH Human (NOVOLIN N) 100 UNIT/ML injection Inject 0.28 mLs (28 Units total) into the skin at bedtime. (Patient taking differently: Inject 22 Units into the skin at bedtime. Inject 26 units under the skin once daily at bedtime.)  . Insulin Pen Needle (B-D UF III MINI PEN NEEDLES) 31G X 5 MM MISC USE 2 PEN NEEDLE PER DAY WITH HUMALOG AND VICTOZA  . liraglutide (VICTOZA) 18 MG/3ML SOPN INJECT 1.8 MG UNDER THE SKIN AT BEDTIME  . metFORMIN (GLUMETZA) 500 MG (MOD) 24 hr tablet Take 1 tablet (500 mg total) by mouth 2 (two) times daily with a meal. (morning & noon)  . metolazone (ZAROXOLYN) 5 MG tablet Take 1 tablet (5 mg total) by mouth daily.  . Multiple Vitamin (MULTIVITAMIN WITH MINERALS) TABS tablet Take 1 tablet  by mouth daily. Mens One a day Vit  . Multiple Vitamins-Minerals (PRESERVISION AREDS 2 PO) Take 1 capsule by mouth 2 (two) times daily.  [name] olmesartan (BENICAR) 20 MG tablet Take 1 tablet (20 mg total) by mouth daily.  . pantoprazole (PROTONIX) 40 MG tablet Take 1 tablet (40 mg total) by mouth 2 (two) times daily.  . pravastatin (PRAVACHOL) 80 MG tablet Take 1 tablet (80 mg total) by mouth every evening.  . tamsulosin (FLOMAX) 0.4 MG CAPS capsule Take 1 capsule daily  . vitamin C (ASCORBIC ACID) 500 MG tablet Take 500 mg by mouth daily.     Allergies:   Bactrim [sulfamethoxazole-trimethoprim] and Morphine and related   Social History   Tobacco Use  . Smoking status: Never Smoker  . Smokeless tobacco: Never Used  Substance Use Topics  . Alcohol use: No  . Drug use: No     Family Hx: The patient's family history includes Cancer in his sister; Emphysema in his father; Healthy in his sister; Heart failure in his father; Hypertension in his mother.  ROS:   Please see the history of present illness.     All other systems reviewed and are negative.   Labs/Other Tests and Data Reviewed:    Recent Labs: 03/04/2018: B Natriuretic Peptide 261.9 03/27/2018: Magnesium 2.2; TSH 0.862 11/13/2018: ALT 15 12/19/2018: BUN 67; Creatinine, Ser 2.08; Hemoglobin 10.7; NT-Pro BNP 3,089; Platelets 197; Potassium 4.8; Sodium 137   Recent Lipid Panel Lab Results  Component Value Date/Time   CHOL 134 04/07/2018 09:10 AM   TRIG 124.0 04/07/2018 09:10 AM   HDL 51.80 04/07/2018 09:10 AM   CHOLHDL 3 04/07/2018 09:10 AM   LDLCALC 58 04/07/2018 09:10 AM   LDLDIRECT 101.0 04/26/2016 09:15 AM    Wt Readings from Last 3 Encounters:  12/26/18 237 lb (107.5 kg)  12/22/18 235 lb (106.6 kg)  11/16/18 238 lb (108 kg)     Objective:    Vital Signs:  BP 128/62 Comment: per pt home BP machine  Pulse (!) 49   Ht 5\' 8"  (1.727 m)   Wt 237 lb (107.5 kg)   BMI 36.04 kg/m    CONSTITUTIONAL:  Well nourished,  well developed male in no acute distress.  EYES: anicteric MOUTH: oral  mucosa is pink RESPIRATORY: Normal respiratory effort, symmetric expansion CARDIOVASCULAR: No peripheral edema SKIN: No rash, lesions or ulcers MUSCULOSKELETAL: no digital cyanosis NEURO: Cranial Nerves II-XII grossly intact, moves all extremities PSYCH: Intact judgement and insight.  A&O x 3, Mood/affect appropriate   ASSESSMENT & PLAN:    1.  OSA - he has severe OSA and has not been compliant with his device.    I stressed the importance of adequate OSA treatment in order to maintain NSR and prevent further issues with volume overload.  Unfortunately he sent his device back a year ago so we are going to have to start over.  He had a sleep study done a few years ago which did show moderate to severe obstructive sleep apnea.  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 After discussing with [place], he will need a repeat sleep study before another device can be ordered.  I will order a home sleep study.   2.  Hypertension - he occasionally checks his BP at home and it is controlled.  He will continue on Olmesartan 20mg  daily.  His creatinine was  2.08 12/19/2018 which was up from 1.4.  3.  Obesity - I have encouraged him to get into a routine exercise program and cut back on carbs and portions.   4.  COVID-19 Education:The signs and symptoms of COVID-19 were discussed with the patient and how to seek care for testing (follow up with PCP or arrange E-visit).  The importance of social distancing was discussed today.  Patient Risk:   After full review of this patient's clinical status, I feel that they are at least moderate risk at this time.  Time:   Today, I have spent 20 minutes directly with the patient on video discussing medical problems including OSA and PAP therapy and issues with compliance, HTN, obesity and healthy eating and exercise as well as reviewing PAP compliance report.  We also reviewed  the symptoms of COVID 19 and the ways to protect against contracting the virus with telehealth technology.  I spent an additional 5 minutes reviewing patient's chart including PAP compliance report.  Medication Adjustments/Labs and Tests Ordered: Current medicines are reviewed at length with the patient today.  Concerns regarding medicines are outlined above.  Tests Ordered: No orders of the defined types were placed in this encounter.  Medication Changes: No orders of the defined types were placed in this encounter.   Disposition:  Follow up after getting back on CPAP  Signed, Fransico Him, MD  12/26/2018 1:06 PM    [place]

## 2018-12-26 ENCOUNTER — Telehealth (INDEPENDENT_AMBULATORY_CARE_PROVIDER_SITE_OTHER): Payer: Medicare Other | Admitting: Cardiology

## 2018-12-26 ENCOUNTER — Encounter: Payer: Self-pay | Admitting: Cardiology

## 2018-12-26 ENCOUNTER — Other Ambulatory Visit: Payer: Self-pay

## 2018-12-26 VITALS — BP 128/62 | HR 49 | Ht 68.0 in | Wt 237.0 lb

## 2018-12-26 DIAGNOSIS — Z7189 Other specified counseling: Secondary | ICD-10-CM

## 2018-12-26 DIAGNOSIS — G4733 Obstructive sleep apnea (adult) (pediatric): Secondary | ICD-10-CM | POA: Diagnosis not present

## 2018-12-26 DIAGNOSIS — I10 Essential (primary) hypertension: Secondary | ICD-10-CM | POA: Diagnosis not present

## 2018-12-26 DIAGNOSIS — E669 Obesity, unspecified: Secondary | ICD-10-CM

## 2018-12-26 DIAGNOSIS — I480 Paroxysmal atrial fibrillation: Secondary | ICD-10-CM

## 2018-12-26 NOTE — Patient Instructions (Signed)
Medication Instructions:  Your physician recommends that you continue on your current medications as directed. Please refer to the Current Medication list given to you today.  If you need a refill on your cardiac medications before your next appointment, please call your pharmacy.   Lab work: None If you have labs (blood work) drawn today and your tests are completely normal, you will receive your results only by: [name] MyChart Message (if you have MyChart) OR . A paper copy in the mail If you have any lab test that is abnormal or we need to change your treatment, we will call you to review the results.  Testing/Procedures: Your CPAP device has been ordered and will be faxed to your DME.    Follow-Up: Your physician recommends that you schedule a follow-up appointment in: 2 months with Dr. Radford Pax.

## 2018-12-26 NOTE — Telephone Encounter (Signed)
Patient's wife is calling you to set up appt. She states she has seen any improvement.

## 2018-12-26 NOTE — Telephone Encounter (Signed)
Obtained patients verbal consent to appointment with Dr Radford Pax 12/26/2018 at 1pm.

## 2018-12-26 NOTE — Progress Notes (Signed)
Cardiology Office Note:    Date:  12/27/2018   ID:  Todd Romero, DOB Feb 11, 1934, MRN [id number]  PCP:  Ma Hillock, DO  Cardiologist:  Sinclair Grooms, MD   Referring MD: Ma Hillock, DO   Chief Complaint  Patient presents with  . Congestive Heart Failure  . Atrial Fibrillation    History of Present Illness:    Tarron Krolak Campione is a 85 y.o. male with a history of severe AS 03/2017 treated with TAVR, , chronic diastolic heart failure, CAD with CTO of D1 and 90% LCX (cath 2013), OSA not on therapy, HTN, type 2 diabetes, and hyperlipidemia.  He is being seen because of progressive lower extremity swelling, dyspnea on exertion, and weight gain.  He denies chest discomfort.  As noted above he is independently discontinued therapy for sleep apnea.  He is here with his wife today.  Since Sunday, he feels that breathing has somewhat improved.  He took metolazone for the first time on Monday but did not notice any increase in urine output until Tuesday.  Weight on his home scales has come down from 242 to 237 pounds.  He has been able to sleep better over the last 2 days.  He is not lightheaded or dizzy.  He feels improvements are occurring.  He is leery of being in the hospital given the [place] pandemic.  He denies chest pain.  He continues to load with amiodarone.  Past Medical History:  Diagnosis Date  . Anemia   . Arthritis   . CAD (coronary artery disease)    a. cardiac cath 12/2016 showing moderate AS, elevated LVEDP, heavy 3V coronary calcification, 100% mD2, 30-50% LAD, 50-90% stenosis of Cx proximal to origin of L-PDA, RCA not engaged due to poor catheter control (difficult procedure) - coronary status was essentially unchanged from prior.  . Chronic diastolic CHF (congestive heart failure) ([place])   . Degenerative arthritis   . Diabetes ([place])   . Dyspnea   . Essential hypertension   . Hyperlipidemia   . New onset a-fib ([place]) 03/04/2018  . Obesity (BMI 30-39.9)  06/18/2015  . OSA (obstructive sleep apnea)    Severe with AHI 27/hr now on CPAP  not compliant with treatment.  . Peritonitis ([place]) 1985?  [name] Pneumonia    "6 months - 85 years old"  . Pure hypercholesterolemia   . RBBB   . [facility] spotted fever   . 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 Severe aortic stenosis    a. s/p TAVR 03/2017.  . Type II or unspecified type diabetes mellitus without mention of complication, not stated as uncontrolled     Past Surgical History:  Procedure Laterality Date  . APPENDECTOMY    . CARDIOVERSION N/A 04/03/2018   Procedure: CARDIOVERSION;  Surgeon: Josue Hector, MD;  Location: [facility] ENDOSCOPY;  Service: Cardiovascular;  Laterality: N/A;  . CARPAL TUNNEL RELEASE    . COLECTOMY     partial  . COLONOSCOPY WITH PROPOFOL N/A 11/18/2017   Procedure: COLONOSCOPY WITH PROPOFOL;  Surgeon: Carol Ada, MD;  Location: [facility] ENDOSCOPY;  Service: Endoscopy;  Laterality: N/A;  . ESOPHAGOGASTRODUODENOSCOPY N/A 11/18/2017   Procedure: ESOPHAGOGASTRODUODENOSCOPY (EGD);  Surgeon: Carol Ada, MD;  Location: [facility] ENDOSCOPY;  Service: Endoscopy;  Laterality: N/A;  . EYE SURGERY Bilateral    cataracts removed, lens placed  . JOINT REPLACEMENT    .  KNEE CARTILAGE SURGERY    . PARTIAL COLECTOMY    . RIGHT/LEFT HEART CATH AND CORONARY ANGIOGRAPHY N/A 12/28/2016   Procedure: Right/Left Heart Cath and Coronary Angiography;  Surgeon: Belva Crome, MD;  Location: [facility] CV LAB;  Service: Cardiovascular;  Laterality: N/A;  . TEE WITHOUT CARDIOVERSION N/A 03/15/2017   Procedure: TRANSESOPHAGEAL ECHOCARDIOGRAM (TEE);  Surgeon: Burnell Blanks, MD;  Location: [place];  Service: Open Heart Surgery;  Laterality: N/A;  . TOTAL KNEE ARTHROPLASTY Right 11/08/2017  . TOTAL KNEE ARTHROPLASTY Right 11/08/2017   Procedure: TOTAL KNEE ARTHROPLASTY;   Surgeon: Melrose Nakayama, MD;  Location: [place];  Service: Orthopedics;  Laterality: Right;  . TRANSCATHETER AORTIC VALVE REPLACEMENT, TRANSFEMORAL N/A 03/15/2017   Procedure: TRANSCATHETER AORTIC VALVE REPLACEMENT, TRANSFEMORAL;  Surgeon: Burnell Blanks, MD;  Location: [place];  Service: Open Heart Surgery;  Laterality: N/A;  . VEIN SURGERY      Current Medications: Current Meds  Medication Sig  . acetaminophen (TYLENOL) 500 MG tablet Take 500 mg by mouth every 4 (four) hours as needed for moderate pain or headache.  [name] amiodarone (PACERONE) 200 MG tablet Take 1 tablet (200 mg total) by mouth 2 (two) times daily.  [name] apixaban (ELIQUIS) 5 MG TABS tablet Take 1 tablet (5 mg total) by mouth 2 (two) times daily.  . ferrous sulfate 325 (65 FE) MG tablet Take 325 mg by mouth 2 (two) times daily with a meal.   . fish oil-omega-3 fatty acids 1000 MG capsule Take 1 g by mouth daily at 12 noon.   . furosemide (LASIX) 80 MG tablet Take 1 tablet (80 mg total) by mouth 2 (two) times daily. Take an extra half tablet (40 mg) if weight goes up 2 pounds or more from day before.  [name] glimepiride (AMARYL) 2 MG tablet Take one tablet twice daily  . glucose blood test strip USE AS INSTRUCTED TO CHECK BLOOD SUGAR TWICE A DAY DX:E11.65  . insulin lispro (HUMALOG KWIKPEN) 100 UNIT/ML KwikPen Inject 12-14 Units into the skin. Inject 10-12 units under the skin before or directly after the main meal of the day.   . insulin NPH Human (NOVOLIN N) 100 UNIT/ML injection Inject 0.28 mLs (28 Units total) into the skin at bedtime.  . Insulin Pen Needle (B-D UF III MINI PEN NEEDLES) 31G X 5 MM MISC USE 2 PEN NEEDLE PER DAY WITH HUMALOG AND VICTOZA  . liraglutide (VICTOZA) 18 MG/3ML SOPN INJECT 1.8 MG UNDER THE SKIN AT BEDTIME  . metFORMIN (GLUMETZA) 500 MG (MOD) 24 hr tablet Take 1 tablet (500 mg total) by mouth 2 (two) times daily with a meal. (morning & noon)  . metolazone (ZAROXOLYN) 5 MG tablet Take 1 tablet (5 mg total)  by mouth daily.  . Multiple Vitamin (MULTIVITAMIN WITH MINERALS) TABS tablet Take 1 tablet by mouth daily. Mens One a day Vit  . Multiple Vitamins-Minerals (PRESERVISION AREDS 2 PO) Take 1 capsule by mouth 2 (two) times daily.  [name] olmesartan (BENICAR) 20 MG tablet Take 1 tablet (20 mg total) by mouth daily.  . pantoprazole (PROTONIX) 40 MG tablet Take 1 tablet (40 mg total) by mouth 2 (two) times daily.  . pravastatin (PRAVACHOL) 80 MG tablet Take 1 tablet (80 mg total) by mouth every evening.  . tamsulosin (FLOMAX) 0.4 MG CAPS capsule Take 1 capsule daily  . vitamin C (ASCORBIC ACID) 500 MG tablet Take 500 mg by mouth daily.     Allergies:   Bactrim [sulfamethoxazole-trimethoprim] and  Morphine and related   Social History   Socioeconomic History  . Marital status: Married    Spouse name: Not on file  . Number of children: 4  . Years of education: Not on file  . Highest education level: Not on file  Occupational History  . Occupation: Construction-Retired  Social Needs  . Financial resource strain: Not on file  . Food insecurity:    Worry: Not on file    Inability: Not on file  . Transportation needs:    Medical: Not on file    Non-medical: Not on file  Tobacco Use  . Smoking status: Never Smoker  . Smokeless tobacco: Never Used  Substance and Sexual Activity  . Alcohol use: No  . Drug use: No  . Sexual activity: Not on file  Lifestyle  . Physical activity:    Days per week: Not on file    Minutes per session: Not on file  . Stress: Not on file  Relationships  . Social connections:    Talks on phone: Not on file    Gets together: Not on file    Attends religious service: Not on file    Active member of club or organization: Not on file    Attends meetings of clubs or organizations: Not on file    Relationship status: Not on file  Other Topics Concern  . Not on file  Social History Narrative  . Not on file     Family History: The patient's family history  includes Cancer in his sister; Emphysema in his father; Healthy in his sister; Heart failure in his father; Hypertension in his mother.  ROS:   Please see the history of present illness.    He has severe sleep apnea.  He has been noncompliant with therapy.  Saw Dr. Radford Pax yesterday.  New equipment and treatment strategy has been designed.  The equipment has not yet arrived.  He is looking forward to having cardioversion.  All other systems reviewed and are negative.  EKGs/Labs/Other Studies Reviewed:    The following studies were reviewed today: Laboratory data from last week was reviewed.  BUN and creatinine are climbing.  Creatinine was 2.05.  BUN 63.  EKG:  EKG performed 12/27/2018 demonstrates atrial fibrillation, controlled rate at 68 bpm, right bundle branch block, inferior infarct and evidence of anterior infarct.  No change when compared to prior tracing on 11/24/2018 except ventricular response is slower.  Recent Labs: 03/04/2018: B Natriuretic Peptide 261.9 03/27/2018: Magnesium 2.2; TSH 0.862 11/13/2018: ALT 15 12/19/2018: BUN 67; Creatinine, Ser 2.08; Hemoglobin 10.7; NT-Pro BNP 3,089; Platelets 197; Potassium 4.8; Sodium 137  Recent Lipid Panel    Component Value Date/Time   CHOL 134 04/07/2018 0910   TRIG 124.0 04/07/2018 0910   HDL 51.80 04/07/2018 0910   CHOLHDL 3 04/07/2018 0910   VLDL 24.8 04/07/2018 0910   LDLCALC 58 04/07/2018 0910   LDLDIRECT 101.0 04/26/2016 0915    Physical Exam:    VS:  BP 126/72   Pulse 61   Ht 5' 8"  (1.727 m)   Wt 243 lb (110.2 kg)   SpO2 97%   BMI 36.95 kg/m     Wt Readings from Last 3 Encounters:  12/27/18 243 lb (110.2 kg)  12/26/18 237 lb (107.5 kg)  12/22/18 235 lb (106.6 kg)     GEN: Obese, bearded.. No acute distress HEENT: Normal NECK: Marked JVD with patient lying at 45 degrees. LYMPHATICS: No lymphadenopathy CARDIAC: IIRR.  1/6 to 2/6  systolic without diastolic murmur, no gallop, 2-3+ ankle to knee edema VASCULAR: 2+  bilateral radial pulses, no bruits RESPIRATORY:  Clear to auscultation without rales, wheezing or rhonchi  ABDOMEN: Soft, non-tender, non-distended, No pulsatile mass, MUSCULOSKELETAL: No deformity  SKIN: Warm and dry NEUROLOGIC:  Alert and oriented x 3 PSYCHIATRIC:  Normal affect   ASSESSMENT:    1. Acute on chronic combined systolic and diastolic CHF (congestive heart failure) ([place])   2. S/P TAVR (transcatheter aortic valve replacement)   3. PAF (paroxysmal atrial fibrillation) ([place])   4. Obstructive sleep apnea   5. Obesity (BMI 30-39.9)    PLAN:    In order of problems listed above:  1. The patient and I had discussion and have arrived of the shared decision to continue outpatient therapy.  He will take metolazone 5 mg each of the next 3 days including today.  We will change furosemide to 120 mg as a single dose each morning and take metolazone 30 to 60 minutes prior to furosemide.  Depending upon his weight and urine output response, we will then go back to furosemide perhaps 80 mg twice a day or 80 mg a.m. and 40 mg p.m.  He will need to have blood work done again next week but will not make that decision until today's blood work is known. 2. TAVR valve seems to be functioning normally 3. On amiodarone, A. fib rate control is good. 4. Treating sleep apnea will be the key to stability and improvement.  I strongly encouraged him to give every effort at compliance.  Plan today is C met, BNP, CBC, and change in diuretic therapy as noted.  Daily weights should be reported to [place].  Weights should be done at the same time each morning on the same circumstances, preferably after urinating.  Follow-up will be based upon response and lab data.   Medication Adjustments/Labs and Tests Ordered: Current medicines are reviewed at length with the patient today.  Concerns regarding medicines are outlined above.  Orders Placed This Encounter  Procedures  . Basic metabolic panel  . Pro b  natriuretic peptide  . Hepatic function panel  . CBC  . EKG 12-Lead   No orders of the defined types were placed in this encounter.   Patient Instructions  Medication Instructions:  1) For the next 3 days, take Metolazone 50m one hour prior to taking Furosemide 1244m  Do not take afternoon dose of Furosemide.  If you need a refill on your cardiac medications before your next appointment, please call your pharmacy.   Lab work: BMET, Liver, CBC and BNP today  If you have labs (blood work) drawn today and your tests are completely normal, you will receive your results only by: . Marland KitchenyChart Message (if you have MyChart) OR . A paper copy in the mail If you have any lab test that is abnormal or we need to change your treatment, we will call you to review the results.  Testing/Procedures: None  Follow-Up: Follow up will be based on results of medication change  Any Other Special Instructions Will Be Listed Below (If Applicable).  We want you to weigh yourself everyday under the same circumstances (in the morning, same amount of clothing, after urination).  Contact the office by phone or through [facility] your weight everyday for the next 7 days.        Signed, HeSinclair GroomsMD  12/27/2018 11:03 AM    [place]

## 2018-12-26 NOTE — Telephone Encounter (Signed)
Spoke with wife and she states swelling and SOB are not any better. PT woke up last night with dizziness and weakness.  Wife checked GBS and it was 79, which is on the low end for him.  She gave him a snack and drink and he felt better. They prefer to come into the office tomorrow for eval and possible admission.  Scheduled pt for 0830 tomorrow.  Advised if any changes, to contact the office or have pt go to ER.  Wife verbalized understanding.

## 2018-12-27 ENCOUNTER — Telehealth: Payer: Self-pay | Admitting: *Deleted

## 2018-12-27 ENCOUNTER — Ambulatory Visit (INDEPENDENT_AMBULATORY_CARE_PROVIDER_SITE_OTHER): Payer: Medicare Other | Admitting: Interventional Cardiology

## 2018-12-27 ENCOUNTER — Encounter: Payer: Self-pay | Admitting: Interventional Cardiology

## 2018-12-27 ENCOUNTER — Other Ambulatory Visit: Payer: Self-pay

## 2018-12-27 VITALS — BP 126/72 | HR 61 | Ht 68.0 in | Wt 243.0 lb

## 2018-12-27 DIAGNOSIS — I480 Paroxysmal atrial fibrillation: Secondary | ICD-10-CM

## 2018-12-27 DIAGNOSIS — I5043 Acute on chronic combined systolic (congestive) and diastolic (congestive) heart failure: Secondary | ICD-10-CM

## 2018-12-27 DIAGNOSIS — G4733 Obstructive sleep apnea (adult) (pediatric): Secondary | ICD-10-CM | POA: Diagnosis not present

## 2018-12-27 DIAGNOSIS — Z952 Presence of prosthetic heart valve: Secondary | ICD-10-CM | POA: Diagnosis not present

## 2018-12-27 DIAGNOSIS — E669 Obesity, unspecified: Secondary | ICD-10-CM

## 2018-12-27 LAB — HEPATIC FUNCTION PANEL
ALT: 17 IU/L (ref 0–44)
AST: 26 IU/L (ref 0–40)
Albumin: 4.3 g/dL (ref 3.6–4.6)
Alkaline Phosphatase: 75 IU/L (ref 39–117)
Bilirubin Total: 0.5 mg/dL (ref 0.0–1.2)
Bilirubin, Direct: 0.2 mg/dL (ref 0.00–0.40)
Total Protein: 6.7 g/dL (ref 6.0–8.5)

## 2018-12-27 LAB — CBC
Hematocrit: 34 % — ABNORMAL LOW (ref 37.5–51.0)
Hemoglobin: 10.9 g/dL — ABNORMAL LOW (ref 13.0–17.7)
MCH: 29.3 pg (ref 26.6–33.0)
MCHC: 32.1 g/dL (ref 31.5–35.7)
MCV: 91 fL (ref 79–97)
Platelets: 179 10*3/uL (ref 150–450)
RBC: 3.72 x10E6/uL — ABNORMAL LOW (ref 4.14–5.80)
RDW: 14.6 % (ref 11.6–15.4)
WBC: 10.3 10*3/uL (ref 3.4–10.8)

## 2018-12-27 LAB — BASIC METABOLIC PANEL
BUN/Creatinine Ratio: 31 — ABNORMAL HIGH (ref 10–24)
BUN: 55 mg/dL — ABNORMAL HIGH (ref 8–27)
CO2: 19 mmol/L — ABNORMAL LOW (ref 20–29)
Calcium: 9.4 mg/dL (ref 8.6–10.2)
Chloride: 99 mmol/L (ref 96–106)
Creatinine, Ser: 1.76 mg/dL — ABNORMAL HIGH (ref 0.76–1.27)
GFR calc Af Amer: 41 mL/min/{1.73_m2} — ABNORMAL LOW (ref >59)
GFR calc non Af Amer: 35 mL/min/{1.73_m2} — ABNORMAL LOW (ref >59)
Glucose: 161 mg/dL — ABNORMAL HIGH (ref 65–99)
Potassium: 4.4 mmol/L (ref 3.5–5.2)
Sodium: 139 mmol/L (ref 134–144)

## 2018-12-27 LAB — PRO B NATRIURETIC PEPTIDE: NT-Pro BNP: 3828 pg/mL — ABNORMAL HIGH (ref 0–486)

## 2018-12-27 NOTE — Patient Instructions (Signed)
Medication Instructions:  1) For the next 3 days, take Metolazone 5mg  one hour prior to taking Furosemide 120mg .  Do not take afternoon dose of Furosemide.  If you need a refill on your cardiac medications before your next appointment, please call your pharmacy.   Lab work: BMET, Liver, CBC and BNP today  If you have labs (blood work) drawn today and your tests are completely normal, you will receive your results only by: [name] MyChart Message (if you have MyChart) OR . A paper copy in the mail If you have any lab test that is abnormal or we need to change your treatment, we will call you to review the results.  Testing/Procedures: None  Follow-Up: Follow up will be based on results of medication change  Any Other Special Instructions Will Be Listed Below (If Applicable).  We want you to weigh yourself everyday under the same circumstances (in the morning, same amount of clothing, after urination).  Contact the office by phone or through [place] with your weight everyday for the next 7 days.

## 2018-12-27 NOTE — Telephone Encounter (Signed)
Order placed to [facility] community message

## 2018-12-27 NOTE — Telephone Encounter (Signed)
-----   Message from Sarina Ill, RN sent at 12/26/2018  1:48 PM EDT ----- Regarding: FW: followup Orders placed, please send.  ----- Message ----- From: Sueanne Margarita, MD Sent: 12/26/2018   1:26 PM EDT To: Sarina Ill, RN Subject: followup                                       Please order a ResMed CPAP device with supplies and heated humidity set on auto titration for 2 weeks from 4 to 20 cm H2O.  Please order a Respironics DreamWear under the nose fullface mask and get a download in 3 weeks.  The patient will need to see me back in 2 months.

## 2018-12-28 ENCOUNTER — Telehealth: Payer: Self-pay | Admitting: Interventional Cardiology

## 2018-12-28 NOTE — Telephone Encounter (Signed)
okay

## 2018-12-28 NOTE — Telephone Encounter (Signed)
Pt wife Calling for lab results, BNP elevated, pts weight is the same today as was yesterday 235 lb, no change in status, denies increased SOB. Please call pt asap, pt aware dr/nurse in after 1300,  I told her to call back after 1400 if no return call, agreed to plan,

## 2018-12-28 NOTE — Telephone Encounter (Signed)
Spoke with Dr. Tamala Julian and ok with no change today since regimen just started.  Wife states pt didn't start the Metolazone 5/Furosemide 120 regimen until today since he had already taken the Furosemide yesterday when he was seen in the office.  Advised wife to continue to monitor and call tomorrow with pt's weight again.  Wife in agreement with plan.

## 2018-12-28 NOTE — Telephone Encounter (Signed)
New Message   Pts wife is calling to report the pts weight is 235 He had blood work done yesterday and is wondering about the results    Please call

## 2018-12-29 ENCOUNTER — Telehealth: Payer: Self-pay | Admitting: Interventional Cardiology

## 2018-12-29 NOTE — Telephone Encounter (Signed)
Agree 

## 2018-12-29 NOTE — Telephone Encounter (Signed)
New Message    Pts wife is calling and says the pts blood pressure is good and he has lost 5lbs over night   BP 133/71 HR 56

## 2018-12-29 NOTE — Telephone Encounter (Signed)
Pt down to 230lbs. Wife states pt said he feels better too.  He was able to lay in bed with her for a little while last night with no issues.  Wife states abdomen is not as tight as it was before starting medications.  Advised to continue with plan and let us know if anything changes.

## 2019-01-01 ENCOUNTER — Telehealth: Payer: Self-pay | Admitting: Interventional Cardiology

## 2019-01-01 ENCOUNTER — Encounter (HOSPITAL_COMMUNITY): Payer: Self-pay

## 2019-01-01 DIAGNOSIS — Z79899 Other long term (current) drug therapy: Secondary | ICD-10-CM

## 2019-01-01 NOTE — Telephone Encounter (Signed)
New Message   Pts wife is calling with daily update on her husband.  Pts weight was 225 Pts wife wanted to mention that he had a spell for her last night, she said he was very disoriented, and was like that for about 30-45 mins.   Please call

## 2019-01-01 NOTE — Telephone Encounter (Signed)
The patient's wife [name]) called to give [place] an update on her husband from Friday's discussion with Anderson Malta B.  The patient has lost an additional 5 lbs over the weekend down to 225#, which is ideal.    She mentioned that he continues to feel weakness and he is tired.  She thought that he faced a bout of disorientation last night, but could not really describe it.  I told her that I would forward on the update and she was thankful for the call.

## 2019-01-02 NOTE — Telephone Encounter (Signed)
I spoke to the patient's wife [name]) and informed them of Dr Thompson Caul recommendation to come for lab work on 4/29 (BMET/BNP).  They verbalized understanding and were thankful for the call.

## 2019-01-02 NOTE — Telephone Encounter (Signed)
Follow Up:   Wife called today-01-02-19 with an update:  The weight have been the same for the last 2 days. Pt also had an episode of being disoriented on Sunday night.

## 2019-01-02 NOTE — Telephone Encounter (Signed)
Needs BMET and BNP within next 2 days.

## 2019-01-03 ENCOUNTER — Other Ambulatory Visit: Payer: Self-pay

## 2019-01-03 ENCOUNTER — Other Ambulatory Visit: Payer: Medicare Other | Admitting: *Deleted

## 2019-01-03 DIAGNOSIS — Z79899 Other long term (current) drug therapy: Secondary | ICD-10-CM

## 2019-01-03 LAB — BASIC METABOLIC PANEL
BUN/Creatinine Ratio: 32 — ABNORMAL HIGH (ref 10–24)
BUN: 71 mg/dL — ABNORMAL HIGH (ref 8–27)
CO2: 20 mmol/L (ref 20–29)
Calcium: 8.7 mg/dL (ref 8.6–10.2)
Chloride: 97 mmol/L (ref 96–106)
Creatinine, Ser: 2.2 mg/dL — ABNORMAL HIGH (ref 0.76–1.27)
GFR calc Af Amer: 31 mL/min/{1.73_m2} — ABNORMAL LOW (ref >59)
GFR calc non Af Amer: 27 mL/min/{1.73_m2} — ABNORMAL LOW (ref >59)
Glucose: 144 mg/dL — ABNORMAL HIGH (ref 65–99)
Potassium: 4.6 mmol/L (ref 3.5–5.2)
Sodium: 136 mmol/L (ref 134–144)

## 2019-01-03 LAB — PRO B NATRIURETIC PEPTIDE: NT-Pro BNP: 2975 pg/mL — ABNORMAL HIGH (ref 0–486)

## 2019-01-04 ENCOUNTER — Telehealth: Payer: Self-pay

## 2019-01-04 NOTE — H&P (View-Only) (Signed)
 Virtual Visit via Video Note   This visit type was conducted due to national recommendations for restrictions regarding the COVID-19 Pandemic (e.g. social distancing) in an effort to limit this patient's exposure and mitigate transmission in our community.  Due to his co-morbid illnesses, this patient is at least at moderate risk for complications without adequate follow up.  This format is felt to be most appropriate for this patient at this time.  All issues noted in this document were discussed and addressed.  A limited physical exam was performed with this format.  Please refer to the patient's chart for his consent to telehealth for [facility].   Evaluation Performed:  Follow-up visit  Date:  01/05/2019   ID:  Todd Romero, DOB 05/08/1934, MRN [id number]  Patient Location: Home Provider Location: Office  PCP:  Kuneff, Renee A, DO  Cardiologist:  Henry W Smith III, MD  Electrophysiologist:  None   Chief Complaint:  CHF/AF  History of Present Illness:    Todd Romero is a 84 y.o. male with  severe AS 03/2017 treated with TAVR,, chronic diastolic heart failure, CAD with CTO of D1 and 90% LCX (cath 2013), OSA not on therapy, HTN, type 2 diabetes, and hyperlipidemia. Recent persistent AF precipitating decompensated HF.  He is feeling better. Gained one pound overnight since holding furosemide starting yesterday PM dose. Overall weight is down on home scales to 228 lbs. he is able to lie down in bed.  Lower extremity swelling has improved.  On his home scales weight was as high as 235 pounds before metolazone.  Overall he feels better and does not feel that hospitalization is needed.  The patient does not have symptoms concerning for COVID-19 infection (fever, chills, cough, or new shortness of breath).    Past Medical History:  Diagnosis Date  . Anemia   . Arthritis   . CAD (coronary artery disease)    a. cardiac cath 12/2016 showing moderate AS, elevated LVEDP,  heavy 3V coronary calcification, 100% mD2, 30-50% LAD, 50-90% stenosis of Cx proximal to origin of L-PDA, RCA not engaged due to poor catheter control (difficult procedure) - coronary status was essentially unchanged from prior.  . Chronic diastolic CHF (congestive heart failure) (HCC)   . Degenerative arthritis   . Diabetes (HCC)   . Dyspnea   . Essential hypertension   . Hyperlipidemia   . New onset a-fib (HCC) 03/04/2018  . Obesity (BMI 30-39.9) 06/18/2015  . OSA (obstructive sleep apnea)    Severe with AHI 27/hr now on CPAP  not compliant with treatment.  . Peritonitis (HCC) 1985?  . Pneumonia    "6 months - 85 years old"  . Pure hypercholesterolemia   . RBBB   . [place] spotted fever   . 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 Severe aortic stenosis    a. s/p TAVR 03/2017.  . Type II or unspecified type diabetes mellitus without mention of complication, not stated as uncontrolled    Past Surgical History:  Procedure Laterality Date  . APPENDECTOMY    . CARDIOVERSION N/A 04/03/2018   Procedure: CARDIOVERSION;  Surgeon: Nishan, Peter C, MD;  [facility];  Service: Cardiovascular;  Laterality: N/A;  . CARPAL TUNNEL RELEASE    . COLECTOMY     partial  . COLONOSCOPY WITH PROPOFOL N/A 11/18/2017   Procedure: COLONOSCOPY WITH PROPOFOL;    Surgeon: Hung, Patrick, MD;  Location: [facility] ENDOS[facility];  Service: Endoscopy;  Laterality: N/A;  . ESOPHAGOGASTRODUODENOSCOPY N/A 11/18/2017   Procedure: ESOPHAGOGASTRODUODENOSCOPY (EGD);  Surgeon: Hung, Patrick, MD;  Location: [facility] ENDOS[facility];  Service: Endoscopy;  Laterality: N/A;  . EYE SURGERY Bilateral    cataracts removed, lens placed  . JOINT REPLACEMENT    . KNEE CARTILAGE SURGERY    . PARTIAL COLECTOMY    . RIGHT/LEFT HEART CATH AND CORONARY ANGIOGRAPHY N/A 12/28/2016   Procedure: Right/Left Heart Cath and  Coronary Angiography;  Surgeon: Henry W Smith, MD;  [facility];  Service: Cardiovascular;  Laterality: N/A;  . TEE WITHOUT CARDIOVERSION N/A 03/15/2017   Procedure: TRANSESOPHAGEAL ECHOCARDIOGRAM (TEE);  Surgeon: McAlhany, Christopher D, MD;  Location: [facility] OR;  Service: Open Heart Surgery;  Laterality: N/A;  . TOTAL KNEE ARTHROPLASTY Right 11/08/2017  . TOTAL KNEE ARTHROPLASTY Right 11/08/2017   Procedure: TOTAL KNEE ARTHROPLASTY;  Surgeon: Dalldorf, Peter, MD;  Location: [facility] OR;  Service: Orthopedics;  Laterality: Right;  . TRANSCATHETER AORTIC VALVE REPLACEMENT, TRANSFEMORAL N/A 03/15/2017   Procedure: TRANSCATHETER AORTIC VALVE REPLACEMENT, TRANSFEMORAL;  Surgeon: McAlhany, Christopher D, MD;  [facility]: [facility];  Service: Open Heart Surgery;  Laterality: N/A;  . VEIN SURGERY       Current Meds  Medication Sig  . acetaminophen (TYLENOL) 500 MG tablet Take 500 mg by mouth every 4 (four) hours as needed for moderate pain or headache.  . amiodarone (PACERONE) 200 MG tablet Take 1 tablet (200 mg total) by mouth 2 (two) times daily.  . apixaban (ELIQUIS) 5 MG TABS tablet Take 1 tablet (5 mg total) by mouth 2 (two) times daily.  . ferrous sulfate 325 (65 FE) MG tablet Take 325 mg by mouth 2 (two) times daily with a meal.   . fish oil-omega-3 fatty acids 1000 MG capsule Take 1 g by mouth daily at 12 noon.   . glimepiride (AMARYL) 2 MG tablet Take one tablet twice daily  . glucose blood test strip USE AS INSTRUCTED TO CHECK BLOOD SUGAR TWICE A DAY DX:E11.65  . insulin lispro (HUMALOG KWIKPEN) 100 UNIT/ML KwikPen Inject 12-14 Units into the skin. Inject 10-12 units under the skin before or directly after the main meal of the day.   . insulin NPH Human (NOVOLIN N) 100 UNIT/ML injection Inject 0.28 mLs (28 Units total) into the skin at bedtime.  . Insulin Pen Needle (B-D UF III MINI PEN NEEDLES) 31G X 5 MM MISC USE 2 PEN NEEDLE PER DAY WITH HUMALOG AND VICTOZA  . liraglutide (VICTOZA) 18  MG/3ML SOPN INJECT 1.8 MG UNDER THE SKIN AT BEDTIME  . metFORMIN (GLUMETZA) 500 MG (MOD) 24 hr tablet Take 1 tablet (500 mg total) by mouth 2 (two) times daily with a meal. (morning & noon)  . Multiple Vitamin (MULTIVITAMIN WITH MINERALS) TABS tablet Take 1 tablet by mouth daily. Mens One a day Vit  . Multiple Vitamins-Minerals (PRESERVISION AREDS 2 PO) Take 1 capsule by mouth 2 (two) times daily.  . olmesartan (BENICAR) 20 MG tablet Take 1 tablet (20 mg total) by mouth daily.  . pantoprazole (PROTONIX) 40 MG tablet Take 1 tablet (40 mg total) by mouth 2 (two) times daily.  . pravastatin (PRAVACHOL) 80 MG tablet Take 1 tablet (80 mg total) by mouth every evening.  . tamsulosin (FLOMAX) 0.4 MG CAPS capsule Take 1 capsule daily  . vitamin C (ASCORBIC ACID) 500 MG tablet Take 500 mg by mouth daily.     Allergies:     Bactrim [sulfamethoxazole-trimethoprim] and Morphine and related   Social History   Tobacco Use  . Smoking status: Never Smoker  . Smokeless tobacco: Never Used  Substance Use Topics  . Alcohol use: No  . Drug use: No     Family Hx: The patient's family history includes Cancer in his sister; Emphysema in his father; Healthy in his sister; Heart failure in his father; Hypertension in his mother.  ROS:   Please see the history of present illness.    No new complaints.  He denies chills and fever. All other systems reviewed and are negative.   Prior CV studies:   The following studies were reviewed today:  No new data  Labs/Other Tests and Data Reviewed:    EKG:  No ECG reviewed.  Recent Labs: 03/04/2018: B Natriuretic Peptide 261.9 03/27/2018: Magnesium 2.2; TSH 0.862 12/27/2018: ALT 17; Hemoglobin 10.9; Platelets 179 01/03/2019: BUN 71; Creatinine, Ser 2.20; NT-Pro BNP 2,975; Potassium 4.6; Sodium 136   Recent Lipid Panel Lab Results  Component Value Date/Time   CHOL 134 04/07/2018 09:10 AM   TRIG 124.0 04/07/2018 09:10 AM   HDL 51.80 04/07/2018 09:10 AM    CHOLHDL 3 04/07/2018 09:10 AM   LDLCALC 58 04/07/2018 09:10 AM   LDLDIRECT 101.0 04/26/2016 09:15 AM    Wt Readings from Last 3 Encounters:  01/05/19 228 lb (103.4 kg)  12/27/18 243 lb (110.2 kg)  12/26/18 237 lb (107.5 kg)     Objective:    Vital Signs:  BP 115/62   Pulse (!) 59   Ht 5' 8" (1.727 m)   Wt 228 lb (103.4 kg)   BMI 34.67 kg/m    VITAL SIGNS:  reviewed GEN:  no acute distress RESPIRATORY:  normal respiratory effort, symmetric expansion NEURO:  alert and oriented x 3, no obvious focal deficit  ASSESSMENT & PLAN:    1. Acute on chronic combined systolic and diastolic CHF (congestive heart failure) (HCC)   2. S/P TAVR (transcatheter aortic valve replacement)   3. PAF (paroxysmal atrial fibrillation) (HCC)   4. Obstructive sleep apnea   5. Essential hypertension, benign   6. Other hyperlipidemia   7. Acute renal failure superimposed on stage 3 chronic kidney disease, unspecified acute renal failure type (HCC)   8. 2019 novel coronavirus disease (COVID-19)    PLAN:  1. Overall, volume status has improved after the addition of metolazone.  He developed acute on chronic kidney injury denoted by a creatinine climbing to 2.2.  Diuretic therapy is currently on pause.  Furosemide 80 mg twice daily will be reinstituted starting in a.m.  Each Monday he will take 5 mg of metolazone 30 to 60 minutes before the a.m. furosemide dose.  Basic metabolic panel will be performed on this coming Thursday when he comes in for an EKG at the [facility] clinic. 2. Not specifically discussed 3. Persistent atrial fibrillation will result and electrical cardioversion within the next 2 weeks.  This is being set up by the A. fib clinic.  When he comes to the A. fib clinic for an EKG on Thursday, May 7, a basic metabolic panel should also be performed. 4. We discussed the importance of managing sleep apnea 5. Blood pressure is adequate 6. Not discussed 7. Because of elevated creatinine at 2.2,  furosemide has been held for 4 doses.  Furosemide will be resumed in a.m.  Be met will be done on May 7.  Call if increasing weight, shortness of breath, or other complaints.    COVID-19 Education: The signs and symptoms of COVID-19 were discussed with the patient and how to seek care for testing (follow up with PCP or arrange E-visit).  The importance of social distancing was discussed today.  Time:   Today, I have spent 15 minutes with the patient with telehealth technology discussing the above problems.     Medication Adjustments/Labs and Tests Ordered: Current medicines are reviewed at length with the patient today.  Concerns regarding medicines are outlined above.   Tests Ordered: Orders Placed This Encounter  Procedures  . Basic metabolic panel    Medication Changes: Meds ordered this encounter  Medications  . furosemide (LASIX) 80 MG tablet    Sig: Take 1 tablet (80 mg total) by mouth daily.    Dispense:  90 tablet    Refill:  3  . metolazone (ZAROXOLYN) 5 MG tablet    Sig: Take 1 tablet (5 mg total) by mouth once a week. On Monday    Dispense:  30 tablet    Refill:  1    Disposition:  Follow up in 4 week(s)  Signed, Henry W Smith III, MD  01/05/2019 11:04 AM    [facility] 

## 2019-01-04 NOTE — Addendum Note (Signed)
Addended by: Freada Bergeron on: 01/04/2019 03:53 PM   Modules accepted: Orders

## 2019-01-04 NOTE — Progress Notes (Signed)
Virtual Visit via Video Note   This visit type was conducted due to national recommendations for restrictions regarding the COVID-19 Pandemic (e.g. social distancing) in an effort to limit this patient's exposure and mitigate transmission in our community.  Due to his co-morbid illnesses, this patient is at least at moderate risk for complications without adequate follow up.  This format is felt to be most appropriate for this patient at this time.  All issues noted in this document were discussed and addressed.  A limited physical exam was performed with this format.  Please refer to the patient's chart for his consent to telehealth for [facility]   Evaluation Performed:  Follow-up visit  Date:  01/05/2019   ID:  Todd Romero, DOB June 30, 1934, MRN [id number]  Patient Location: Home Provider Location: Office  PCP:  Ma Hillock, DO  Cardiologist:  Sinclair Grooms, MD  Electrophysiologist:  None   Chief Complaint:  CHF/AF  History of Present Illness:    Todd Romero is a 85 y.o. male with  severe AS 03/2017 treated with TAVR,, chronic diastolic heart failure, CAD with CTO of D1 and 90% LCX (cath 2013), OSA not on therapy, HTN, type 2 diabetes, and hyperlipidemia. Recent persistent AF precipitating decompensated HF.  He is feeling better. Gained one pound overnight since holding furosemide starting yesterday PM dose. Overall weight is down on home scales to 228 lbs. he is able to lie down in bed.  Lower extremity swelling has improved.  On his home scales weight was as high as 235 pounds before metolazone.  Overall he feels better and does not feel that hospitalization is needed.  The patient does not have symptoms concerning for COVID-19 infection (fever, chills, cough, or new shortness of breath).    Past Medical History:  Diagnosis Date  . Anemia   . Arthritis   . CAD (coronary artery disease)    a. cardiac cath 12/2016 showing moderate AS, elevated LVEDP,  heavy 3V coronary calcification, 100% mD2, 30-50% LAD, 50-90% stenosis of Cx proximal to origin of L-PDA, RCA not engaged due to poor catheter control (difficult procedure) - coronary status was essentially unchanged from prior.  . Chronic diastolic CHF (congestive heart failure) ([place])   . Degenerative arthritis   . Diabetes ([place])   . Dyspnea   . Essential hypertension   . Hyperlipidemia   . New onset a-fib ([place]) 03/04/2018  . Obesity (BMI 30-39.9) 06/18/2015  . OSA (obstructive sleep apnea)    Severe with AHI 27/hr now on CPAP  not compliant with treatment.  . Peritonitis ([place]) 1985?  [name] Pneumonia    "6 months - 85 years old"  . Pure hypercholesterolemia   . RBBB   . [facility] spotted fever   . 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 Severe aortic stenosis    a. s/p TAVR 03/2017.  . Type II or unspecified type diabetes mellitus without mention of complication, not stated as uncontrolled    Past Surgical History:  Procedure Laterality Date  . APPENDECTOMY    . CARDIOVERSION N/A 04/03/2018   Procedure: CARDIOVERSION;  Surgeon: Josue Hector, MD;  Location[facility] ENDOSCOPY;  Service: Cardiovascular;  Laterality: N/A;  . CARPAL TUNNEL RELEASE    . COLECTOMY     partial  . COLONOSCOPY WITH PROPOFOL N/A 11/18/2017   Procedure: COLONOSCOPY WITH PROPOFOL;  Surgeon: Carol Ada, MD;  Location: [facility];  Service: Endoscopy;  Laterality: N/A;  . ESOPHAGOGASTRODUODENOSCOPY N/A 11/18/2017   Procedure: ESOPHAGOGASTRODUODENOSCOPY (EGD);  Surgeon: Carol Ada, MD;  Location: [facility] ENDOSCOPY;  Service: Endoscopy;  Laterality: N/A;  . EYE SURGERY Bilateral    cataracts removed, lens placed  . JOINT REPLACEMENT    . KNEE CARTILAGE SURGERY    . PARTIAL COLECTOMY    . RIGHT/LEFT HEART CATH AND CORONARY ANGIOGRAPHY N/A 12/28/2016   Procedure: Right/Left Heart Cath and  Coronary Angiography;  Surgeon: Belva Crome, MD;  Location: [place] CV LAB;  Service: Cardiovascular;  Laterality: N/A;  . TEE WITHOUT CARDIOVERSION N/A 03/15/2017   Procedure: TRANSESOPHAGEAL ECHOCARDIOGRAM (TEE);  Surgeon: Burnell Blanks, MD;  Location: [place];  Service: Open Heart Surgery;  Laterality: N/A;  . TOTAL KNEE ARTHROPLASTY Right 11/08/2017  . TOTAL KNEE ARTHROPLASTY Right 11/08/2017   Procedure: TOTAL KNEE ARTHROPLASTY;  Surgeon: Melrose Nakayama, MD;  Location: [place];  Service: Orthopedics;  Laterality: Right;  . TRANSCATHETER AORTIC VALVE REPLACEMENT, TRANSFEMORAL N/A 03/15/2017   Procedure: TRANSCATHETER AORTIC VALVE REPLACEMENT, TRANSFEMORAL;  Surgeon: Burnell Blanks, MD;  Location: [place];  Service: Open Heart Surgery;  Laterality: N/A;  . VEIN SURGERY       Current Meds  Medication Sig  . acetaminophen (TYLENOL) 500 MG tablet Take 500 mg by mouth every 4 (four) hours as needed for moderate pain or headache.  [name] amiodarone (PACERONE) 200 MG tablet Take 1 tablet (200 mg total) by mouth 2 (two) times daily.  [name] apixaban (ELIQUIS) 5 MG TABS tablet Take 1 tablet (5 mg total) by mouth 2 (two) times daily.  . ferrous sulfate 325 (65 FE) MG tablet Take 325 mg by mouth 2 (two) times daily with a meal.   . fish oil-omega-3 fatty acids 1000 MG capsule Take 1 g by mouth daily at 12 noon.   [name] glimepiride (AMARYL) 2 MG tablet Take one tablet twice daily  . glucose blood test strip USE AS INSTRUCTED TO CHECK BLOOD SUGAR TWICE A DAY DX:E11.65  . insulin lispro (HUMALOG KWIKPEN) 100 UNIT/ML KwikPen Inject 12-14 Units into the skin. Inject 10-12 units under the skin before or directly after the main meal of the day.   . insulin NPH Human (NOVOLIN N) 100 UNIT/ML injection Inject 0.28 mLs (28 Units total) into the skin at bedtime.  . Insulin Pen Needle (B-D UF III MINI PEN NEEDLES) 31G X 5 MM MISC USE 2 PEN NEEDLE PER DAY WITH HUMALOG AND VICTOZA  . liraglutide (VICTOZA) 18  MG/3ML SOPN INJECT 1.8 MG UNDER THE SKIN AT BEDTIME  . metFORMIN (GLUMETZA) 500 MG (MOD) 24 hr tablet Take 1 tablet (500 mg total) by mouth 2 (two) times daily with a meal. (morning & noon)  . Multiple Vitamin (MULTIVITAMIN WITH MINERALS) TABS tablet Take 1 tablet by mouth daily. Mens One a day Vit  . Multiple Vitamins-Minerals (PRESERVISION AREDS 2 PO) Take 1 capsule by mouth 2 (two) times daily.  [name] olmesartan (BENICAR) 20 MG tablet Take 1 tablet (20 mg total) by mouth daily.  . pantoprazole (PROTONIX) 40 MG tablet Take 1 tablet (40 mg total) by mouth 2 (two) times daily.  . pravastatin (PRAVACHOL) 80 MG tablet Take 1 tablet (80 mg total) by mouth every evening.  . tamsulosin (FLOMAX) 0.4 MG CAPS capsule Take 1 capsule daily  . vitamin C (ASCORBIC ACID) 500 MG tablet Take 500 mg by mouth daily.     Allergies:  Bactrim [sulfamethoxazole-trimethoprim] and Morphine and related   Social History   Tobacco Use  . Smoking status: Never Smoker  . Smokeless tobacco: Never Used  Substance Use Topics  . Alcohol use: No  . Drug use: No     Family Hx: The patient's family history includes Cancer in his sister; Emphysema in his father; Healthy in his sister; Heart failure in his father; Hypertension in his mother.  ROS:   Please see the history of present illness.    No new complaints.  He denies chills and fever. All other systems reviewed and are negative.   Prior CV studies:   The following studies were reviewed today:  No new data  Labs/Other Tests and Data Reviewed:    EKG:  No ECG reviewed.  Recent Labs: 03/04/2018: B Natriuretic Peptide 261.9 03/27/2018: Magnesium 2.2; TSH 0.862 12/27/2018: ALT 17; Hemoglobin 10.9; Platelets 179 01/03/2019: BUN 71; Creatinine, Ser 2.20; NT-Pro BNP 2,975; Potassium 4.6; Sodium 136   Recent Lipid Panel Lab Results  Component Value Date/Time   CHOL 134 04/07/2018 09:10 AM   TRIG 124.0 04/07/2018 09:10 AM   HDL 51.80 04/07/2018 09:10 AM    CHOLHDL 3 04/07/2018 09:10 AM   LDLCALC 58 04/07/2018 09:10 AM   LDLDIRECT 101.0 04/26/2016 09:15 AM    Wt Readings from Last 3 Encounters:  01/05/19 228 lb (103.4 kg)  12/27/18 243 lb (110.2 kg)  12/26/18 237 lb (107.5 kg)     Objective:    Vital Signs:  BP 115/62   Pulse (!) 59   Ht _0  (1.727 m)   Wt 228 lb (103.4 kg)   BMI 34.67 kg/m    VITAL SIGNS:  reviewed GEN:  no acute distress RESPIRATORY:  normal respiratory effort, symmetric expansion NEURO:  alert and oriented x 3, no obvious focal deficit  ASSESSMENT & PLAN:    1. Acute on chronic combined systolic and diastolic CHF (congestive heart failure) ([place])   2. S/P TAVR (transcatheter aortic valve replacement)   3. PAF (paroxysmal atrial fibrillation) ([place])   4. Obstructive sleep apnea   5. Essential hypertension, benign   6. Other hyperlipidemia   7. Acute renal failure superimposed on stage 3 chronic kidney disease, unspecified acute renal failure type ([place])   8. 2019 novel coronavirus disease (COVID-19)    PLAN:  1. Overall, volume status has improved after the addition of metolazone.  He developed acute on chronic kidney injury denoted by a creatinine climbing to 2.2.  Diuretic therapy is currently on pause.  Furosemide 80 mg twice daily will be reinstituted starting in a.m.  Each Monday he will take 5 mg of metolazone 30 to 60 minutes before the a.m. furosemide dose.  Basic metabolic panel will be performed on this coming Thursday when he comes in for an EKG at the A. fib clinic. 2. Not specifically discussed 3. Persistent atrial fibrillation will result and electrical cardioversion within the next 2 weeks.  This is being set up by the A. fib clinic.  When he comes to the A. fib clinic for an EKG on Thursday, May 7, a basic metabolic panel should also be performed. 4. We discussed the importance of managing sleep apnea 5. Blood pressure is adequate 6. Not discussed 7. Because of elevated creatinine at 2.2,  furosemide has been held for 4 doses.  Furosemide will be resumed in a.m.  Be met will be done on May 7.  Call if increasing weight, shortness of breath, or other complaints.  COVID-19 Education: The signs and symptoms of COVID-19 were discussed with the patient and how to seek care for testing (follow up with PCP or arrange E-visit).  The importance of social distancing was discussed today.  Time:   Today, I have spent 15 minutes with the patient with telehealth technology discussing the above problems.     Medication Adjustments/Labs and Tests Ordered: Current medicines are reviewed at length with the patient today.  Concerns regarding medicines are outlined above.   Tests Ordered: Orders Placed This Encounter  Procedures  . Basic metabolic panel    Medication Changes: Meds ordered this encounter  Medications  . furosemide (LASIX) 80 MG tablet    Sig: Take 1 tablet (80 mg total) by mouth daily.    Dispense:  90 tablet    Refill:  3  . metolazone (ZAROXOLYN) 5 MG tablet    Sig: Take 1 tablet (5 mg total) by mouth once a week. On Monday    Dispense:  30 tablet    Refill:  1    Disposition:  Follow up in 4 week(s)  Signed, Sinclair Grooms, MD  01/05/2019 11:04 AM    [place]

## 2019-01-04 NOTE — Telephone Encounter (Signed)
YOUR CARDIOLOGY TEAM HAS ARRANGED FOR AN E-VISIT FOR YOUR APPOINTMENT - PLEASE REVIEW IMPORTANT INFORMATION BELOW SEVERAL DAYS PRIOR TO YOUR APPOINTMENT  Due to the recent COVID-19 pandemic, we are transitioning in-person office visits to tele-medicine visits in an effort to decrease unnecessary exposure to our patients, their families, and staff. These visits are billed to your insurance just like a normal visit is. We also encourage you to sign up for MyChart if you have not already done so. You will need a smartphone if possible. For patients that do not have this, we can still complete the visit using a regular telephone but do prefer a smartphone to enable video when possible. You may have a family member that lives with you that can help. If possible, we also ask that you have a blood pressure cuff and scale at home to measure your blood pressure, heart rate and weight prior to your scheduled appointment. Patients with clinical needs that need an in-person evaluation and testing will still be able to come to the office if absolutely necessary. If you have any questions, feel free to call our office.     YOUR PROVIDER WILL BE USING THE FOLLOWING PLATFORM TO COMPLETE YOUR VISIT: Doximity  . IF USING MYCHART - How to Download the MyChart App to Your SmartPhone   - If Apple, go to App Store and type in MyChart in the search bar and download the app. If Android, ask patient to go to Google Play Store and type in MyChart in the search bar and download the app. The app is free but as with any other app downloads, your phone may require you to verify saved payment information or Apple/Android password.  - You will need to then log into the app with your MyChart username and password, and select [facility] as your healthcare provider to link the account.  - When it is time for your visit, go to the MyChart app, find appointments, and click Begin Video Visit. Be sure to Select Allow for your device to  access the Microphone and Camera for your visit. You will then be connected, and your provider will be with you shortly.  **If you have any issues connecting or need assistance, please contact MyChart service desk [phone] ([phone])**  **If using a computer, in order to ensure the best quality for your visit, you will need to use either of the following Internet Browsers: Google Chrome or Microsoft Edge**  . IF USING DOXIMITY or DOXY.ME - The staff will give you instructions on receiving your link to join the meeting the day of your visit.      2-3 DAYS BEFORE YOUR APPOINTMENT  You will receive a telephone call from one of our [facility]Care team members - your caller ID may say "Unknown caller." If this is a video visit, we will walk you through how to get the video launched on your phone. We will remind you check your blood pressure, heart rate and weight prior to your scheduled appointment. If you have an Apple Watch or Kardia, please upload any pertinent ECG strips the day before or morning of your appointment to MyChart. Our staff will also make sure you have reviewed the consent and agree to move forward with your scheduled tele-health visit.     THE DAY OF YOUR APPOINTMENT  Approximately 15 minutes prior to your scheduled appointment, you will receive a telephone call from one of HeartCare team - your caller ID may say "Unknown caller."    Our staff will confirm medications, vital signs for the day and any symptoms you may be experiencing. Please have this information available prior to the time of visit start. It may also be helpful for you to have a pad of paper and pen handy for any instructions given during your visit. They will also walk you through joining the smartphone meeting if this is a video visit.    CONSENT FOR TELE-HEALTH VISIT - PLEASE REVIEW  I hereby voluntarily request, consent and authorize [facility] and its employed or contracted physicians, physician  assistants, nurse practitioners or other licensed health care professionals (the Practitioner), to provide me with telemedicine health care services (the "Services") as deemed necessary by the treating Practitioner. I acknowledge and consent to receive the Services by the Practitioner via telemedicine. I understand that the telemedicine visit will involve communicating with the Practitioner through live audiovisual communication technology and the disclosure of certain medical information by electronic transmission. I acknowledge that I have been given the opportunity to request an in-person assessment or other available alternative prior to the telemedicine visit and am voluntarily participating in the telemedicine visit.  I understand that I have the right to withhold or withdraw my consent to the use of telemedicine in the course of my care at any time, without affecting my right to future care or treatment, and that the Practitioner or I may terminate the telemedicine visit at any time. I understand that I have the right to inspect all information obtained and/or recorded in the course of the telemedicine visit and may receive copies of available information for a reasonable fee.  I understand that some of the potential risks of receiving the Services via telemedicine include:  . Delay or interruption in medical evaluation due to technological equipment failure or disruption; . Information transmitted may not be sufficient (e.g. poor resolution of images) to allow for appropriate medical decision making by the Practitioner; and/or  . In rare instances, security protocols could fail, causing a breach of personal health information.  Furthermore, I acknowledge that it is my responsibility to provide information about my medical history, conditions and care that is complete and accurate to the best of my ability. I acknowledge that Practitioner's advice, recommendations, and/or decision may be based on  factors not within their control, such as incomplete or inaccurate data provided by me or distortions of diagnostic images or specimens that may result from electronic transmissions. I understand that the practice of medicine is not an exact science and that Practitioner makes no warranties or guarantees regarding treatment outcomes. I acknowledge that I will receive a copy of this consent concurrently upon execution via email to the email address I last provided but may also request a printed copy by calling the office of [facility].    I understand that my insurance will be billed for this visit.   I have read or had this consent read to me. . I understand the contents of this consent, which adequately explains the benefits and risks of the Services being provided via telemedicine.  . I have been provided ample opportunity to ask questions regarding this consent and the Services and have had my questions answered to my satisfaction. . I give my informed consent for the services to be provided through the use of telemedicine in my medical care  By participating in this telemedicine visit I agree to the above.  

## 2019-01-04 NOTE — Telephone Encounter (Signed)
Order placed to precert

## 2019-01-04 NOTE — Telephone Encounter (Signed)
Per Dr Radford Pax Home Sleep Study order placed.

## 2019-01-04 NOTE — Telephone Encounter (Signed)
RE: New Order  Received: 3 days ago  Message Contents  Turner, Eber Hong, MD  Marilynne Drivers, [place]; Kordsmeier, Mary Sella, RN        Please order a home sleep study  Traci   Previous Messages    ----- Message -----  From: Darlina Guys  Sent: 01/01/2019  2:19 PM EDT  To: Sueanne Margarita, MD, Freada Bergeron, CMA  Subject: FW: New Order                   Good afternoon!   This patient was originally set up with CPAP on 04/15/15 but then turned in his CPAP AMA on 12/31/16.    Since there has been a break in therapy, he will need to start the process over again with a new sleep study. You can amend the last office visit note with the need to order a new study.   Once the study is done, let me know and we can process the new CPAP order.   Let me know if you have questions.   Thank you,   Lenna Sciara

## 2019-01-05 ENCOUNTER — Other Ambulatory Visit: Payer: Self-pay

## 2019-01-05 ENCOUNTER — Encounter: Payer: Self-pay | Admitting: Interventional Cardiology

## 2019-01-05 ENCOUNTER — Telehealth (INDEPENDENT_AMBULATORY_CARE_PROVIDER_SITE_OTHER): Payer: Medicare Other | Admitting: Interventional Cardiology

## 2019-01-05 VITALS — BP 115/62 | HR 59 | Ht 68.0 in | Wt 228.0 lb

## 2019-01-05 DIAGNOSIS — I5043 Acute on chronic combined systolic (congestive) and diastolic (congestive) heart failure: Secondary | ICD-10-CM

## 2019-01-05 DIAGNOSIS — E7849 Other hyperlipidemia: Secondary | ICD-10-CM

## 2019-01-05 DIAGNOSIS — N179 Acute kidney failure, unspecified: Secondary | ICD-10-CM

## 2019-01-05 DIAGNOSIS — Z952 Presence of prosthetic heart valve: Secondary | ICD-10-CM

## 2019-01-05 DIAGNOSIS — G4733 Obstructive sleep apnea (adult) (pediatric): Secondary | ICD-10-CM

## 2019-01-05 DIAGNOSIS — I480 Paroxysmal atrial fibrillation: Secondary | ICD-10-CM

## 2019-01-05 DIAGNOSIS — U071 COVID-19: Secondary | ICD-10-CM

## 2019-01-05 DIAGNOSIS — N183 Chronic kidney disease, stage 3 (moderate): Secondary | ICD-10-CM

## 2019-01-05 DIAGNOSIS — I10 Essential (primary) hypertension: Secondary | ICD-10-CM

## 2019-01-05 MED ORDER — FUROSEMIDE 80 MG PO TABS: 80.0000 mg | ORAL_TABLET | Freq: Every day | ORAL | 3 refills | Status: DC

## 2019-01-05 MED ORDER — METOLAZONE 5 MG PO TABS: 5.0000 mg | ORAL_TABLET | ORAL | 1 refills | Status: DC

## 2019-01-05 NOTE — Patient Instructions (Signed)
Medication Instructions:  1) RESTART Furosemide 80mg  twice daily tomorrow morning 2) START Metolazone 5mg - 30-60 minutes prior to morning dose of Furosemide on Mondays  If you need a refill on your cardiac medications before your next appointment, please call your pharmacy.   Lab work: Your physician recommends that you return for lab work next Thursday the 7th.  If you have labs (blood work) drawn today and your tests are completely normal, you will receive your results only by: [name] MyChart Message (if you have MyChart) OR . A paper copy in the mail If you have any lab test that is abnormal or we need to change your treatment, we will call you to review the results.  Testing/Procedures: None  Follow-Up: At [facility], you and your health needs are our priority.  As part of our continuing mission to provide you with exceptional heart care, we have created designated Provider Care Teams.  These Care Teams include your primary Cardiologist (physician) and Advanced Practice Providers (APPs -  Physician Assistants and Nurse Practitioners) who all work together to provide you with the care you need, when you need it. You will need a follow up appointment in 3-4 weeks.  Please call our office 2 months in advance to schedule this appointment.  You may see Sinclair Grooms, MD or one of the following Advanced Practice Providers on your designated Care Team:   Truitt Merle, NP Cecilie Kicks, NP . Kathyrn Drown, NP  Any Other Special Instructions Will Be Listed Below (If Applicable).

## 2019-01-08 ENCOUNTER — Telehealth: Payer: Self-pay | Admitting: *Deleted

## 2019-01-08 NOTE — Telephone Encounter (Signed)
-----   Message from Freada Bergeron, [place] sent at 01/04/2019  2:53 PM EDT ----- Regarding: precert Please order a home sleep study

## 2019-01-08 NOTE — Telephone Encounter (Signed)
Staff message sent to Gae Bon no PA is needed for HST. Ok to schedule when lab reopens from COVID-19.

## 2019-01-10 ENCOUNTER — Other Ambulatory Visit: Payer: Self-pay

## 2019-01-10 MED ORDER — INSULIN NPH (HUMAN) (ISOPHANE) 100 UNIT/ML ~~LOC~~ SUSP: 28.0000 [IU] | Freq: Every day | SUBCUTANEOUS | 3 refills | Status: DC

## 2019-01-11 ENCOUNTER — Other Ambulatory Visit: Payer: Self-pay

## 2019-01-11 ENCOUNTER — Ambulatory Visit (HOSPITAL_COMMUNITY)
Admission: RE | Admit: 2019-01-11 | Discharge: 2019-01-11 | Disposition: A | Discharge status: Home / Self Care | Payer: Medicare Other | Source: Ambulatory Visit | Attending: Physician Assistant | Admitting: Physician Assistant

## 2019-01-11 ENCOUNTER — Other Ambulatory Visit: Payer: Medicare Other | Admitting: *Deleted

## 2019-01-11 ENCOUNTER — Encounter (HOSPITAL_COMMUNITY): Payer: Self-pay | Admitting: Physician Assistant

## 2019-01-11 VITALS — BP 118/62 | HR 63 | Ht 68.0 in | Wt 228.0 lb

## 2019-01-11 DIAGNOSIS — I5042 Chronic combined systolic (congestive) and diastolic (congestive) heart failure: Secondary | ICD-10-CM | POA: Insufficient documentation

## 2019-01-11 DIAGNOSIS — Z825 Family history of asthma and other chronic lower respiratory diseases: Secondary | ICD-10-CM | POA: Insufficient documentation

## 2019-01-11 DIAGNOSIS — Z8249 Family history of ischemic heart disease and other diseases of the circulatory system: Secondary | ICD-10-CM | POA: Diagnosis not present

## 2019-01-11 DIAGNOSIS — Z7901 Long term (current) use of anticoagulants: Secondary | ICD-10-CM | POA: Insufficient documentation

## 2019-01-11 DIAGNOSIS — Z794 Long term (current) use of insulin: Secondary | ICD-10-CM | POA: Insufficient documentation

## 2019-01-11 DIAGNOSIS — Z952 Presence of prosthetic heart valve: Secondary | ICD-10-CM | POA: Insufficient documentation

## 2019-01-11 DIAGNOSIS — M1990 Unspecified osteoarthritis, unspecified site: Secondary | ICD-10-CM | POA: Diagnosis not present

## 2019-01-11 DIAGNOSIS — Z96651 Presence of right artificial knee joint: Secondary | ICD-10-CM | POA: Diagnosis not present

## 2019-01-11 DIAGNOSIS — I2510 Atherosclerotic heart disease of native coronary artery without angina pectoris: Secondary | ICD-10-CM | POA: Diagnosis not present

## 2019-01-11 DIAGNOSIS — Z79899 Other long term (current) drug therapy: Secondary | ICD-10-CM | POA: Insufficient documentation

## 2019-01-11 DIAGNOSIS — E119 Type 2 diabetes mellitus without complications: Secondary | ICD-10-CM | POA: Diagnosis not present

## 2019-01-11 DIAGNOSIS — Z882 Allergy status to sulfonamides status: Secondary | ICD-10-CM | POA: Diagnosis not present

## 2019-01-11 DIAGNOSIS — I5043 Acute on chronic combined systolic (congestive) and diastolic (congestive) heart failure: Secondary | ICD-10-CM

## 2019-01-11 DIAGNOSIS — Z885 Allergy status to narcotic agent status: Secondary | ICD-10-CM | POA: Diagnosis not present

## 2019-01-11 DIAGNOSIS — I110 Hypertensive heart disease with heart failure: Secondary | ICD-10-CM | POA: Insufficient documentation

## 2019-01-11 DIAGNOSIS — E785 Hyperlipidemia, unspecified: Secondary | ICD-10-CM | POA: Diagnosis not present

## 2019-01-11 DIAGNOSIS — G4733 Obstructive sleep apnea (adult) (pediatric): Secondary | ICD-10-CM | POA: Insufficient documentation

## 2019-01-11 DIAGNOSIS — Z7984 Long term (current) use of oral hypoglycemic drugs: Secondary | ICD-10-CM | POA: Insufficient documentation

## 2019-01-11 DIAGNOSIS — Z6834 Body mass index (BMI) 34.0-34.9, adult: Secondary | ICD-10-CM | POA: Insufficient documentation

## 2019-01-11 DIAGNOSIS — E669 Obesity, unspecified: Secondary | ICD-10-CM | POA: Insufficient documentation

## 2019-01-11 DIAGNOSIS — I4819 Other persistent atrial fibrillation: Secondary | ICD-10-CM | POA: Insufficient documentation

## 2019-01-11 LAB — BASIC METABOLIC PANEL
BUN/Creatinine Ratio: 36 — ABNORMAL HIGH (ref 10–24)
BUN: 74 mg/dL — ABNORMAL HIGH (ref 8–27)
CO2: 19 mmol/L — ABNORMAL LOW (ref 20–29)
Calcium: 9.3 mg/dL (ref 8.6–10.2)
Chloride: 97 mmol/L (ref 96–106)
Creatinine, Ser: 2.05 mg/dL — ABNORMAL HIGH (ref 0.76–1.27)
GFR calc Af Amer: 34 mL/min/{1.73_m2} — ABNORMAL LOW (ref >59)
GFR calc non Af Amer: 29 mL/min/{1.73_m2} — ABNORMAL LOW (ref >59)
Glucose: 145 mg/dL — ABNORMAL HIGH (ref 65–99)
Potassium: 4.5 mmol/L (ref 3.5–5.2)
Sodium: 134 mmol/L (ref 134–144)

## 2019-01-11 MED ORDER — INSULIN NPH (HUMAN) (ISOPHANE) 100 UNIT/ML ~~LOC~~ SUSP: SUBCUTANEOUS | 1 refills | Status: DC

## 2019-01-11 NOTE — Patient Instructions (Signed)
In one week 01/18/2019, Decrease amiodarone to 200 mg ONCE daily  We will be working on getting you scheduled for a cardioversion as soon as the Covid precautions are put into place.

## 2019-01-11 NOTE — Progress Notes (Signed)
Primary Care Physician: Ma Hillock, DO Primary Cardiologist: Dr Tamala Julian Primary Electrophysiologist: Dr Rayann Heman Referring Physician: Dr Rosilyn Mings Todd Romero is a 85 y.o. male with a history of persistent atrial fibrillation who presents for follow up in the [facility]. Patient reports doing reasonably well since his last visit with Dr Tamala Julian. He continues to have SOB and fatigue but his symptoms have not worsened. His weight is stable and his heart rates appear to be controlled. ECG today does show rate controlled afib.  Today, he denies symptoms of palpitations, chest pain, dizziness, presyncope, syncope, snoring, daytime somnolence, bleeding, or neurologic sequela. The patient is tolerating medications without difficulties and is otherwise without complaint today.    Atrial Fibrillation Risk Factors:  he does have symptoms or diagnosis of sleep apnea. he is not compliant with CPAP therapy.  he has a BMI of Body mass index is 34.67 kg/m.[name] Filed Weights   01/11/19 1144  Weight: 103.4 kg    Family History  Problem Relation Age of Onset  . Heart failure Father   . Emphysema Father   . Hypertension Mother   . Cancer Sister   . Healthy Sister      Atrial Fibrillation Management history:  Previous antiarrhythmic drugs: amiodarone Previous cardioversions: 03/2018 Previous ablations: none CHADS2VASC score: 6 Anticoagulation history: Eliquis   Past Medical History:  Diagnosis Date  . Anemia   . Arthritis   . CAD (coronary artery disease)    a. cardiac cath 12/2016 showing moderate AS, elevated LVEDP, heavy 3V coronary calcification, 100% mD2, 30-50% LAD, 50-90% stenosis of Cx proximal to origin of L-PDA, RCA not engaged due to poor catheter control (difficult procedure) - coronary status was essentially unchanged from prior.  . Chronic diastolic CHF (congestive heart failure) ([place])   . Degenerative arthritis   . Diabetes ([place])   .  Dyspnea   . Essential hypertension   . Hyperlipidemia   . New onset a-fib ([place]) 03/04/2018  . Obesity (BMI 30-39.9) 06/18/2015  . OSA (obstructive sleep apnea)    Severe with AHI 27/hr now on CPAP  not compliant with treatment.  . Peritonitis ([place]) 1985?  [name] Pneumonia    "6 months - 85 years old"  . Pure hypercholesterolemia   . RBBB   . [facility] spotted fever   . 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 Severe aortic stenosis    a. s/p TAVR 03/2017.  . Type II or unspecified type diabetes mellitus without mention of complication, not stated as uncontrolled    Past Surgical History:  Procedure Laterality Date  . APPENDECTOMY    . CARDIOVERSION N/A 04/03/2018   Procedure: CARDIOVERSION;  Surgeon: Josue Hector, MD;  Location: [facility] ENDOSCOPY;  Service: Cardiovascular;  Laterality: N/A;  . CARPAL TUNNEL RELEASE    . COLECTOMY     partial  . COLONOSCOPY WITH PROPOFOL N/A 11/18/2017   Procedure: COLONOSCOPY WITH PROPOFOL;  Surgeon: Carol Ada, MD;  Location: [facility] ENDOSCOPY;  Service: Endoscopy;  Laterality: N/A;  . ESOPHAGOGASTRODUODENOSCOPY N/A 11/18/2017   Procedure: ESOPHAGOGASTRODUODENOSCOPY (EGD);  Surgeon: Carol Ada, MD;  Location: [facility] ENDOSCOPY;  Service: Endoscopy;  Laterality: N/A;  . EYE SURGERY Bilateral    cataracts removed, lens placed  . JOINT REPLACEMENT    . KNEE CARTILAGE SURGERY    . PARTIAL COLECTOMY    .  RIGHT/LEFT HEART CATH AND CORONARY ANGIOGRAPHY N/A 12/28/2016   Procedure: Right/Left Heart Cath and Coronary Angiography;  Surgeon: Belva Crome, MD;  Location: [facility] CV LAB;  Service: Cardiovascular;  Laterality: N/A;  . TEE WITHOUT CARDIOVERSION N/A 03/15/2017   Procedure: TRANSESOPHAGEAL ECHOCARDIOGRAM (TEE);  Surgeon: Burnell Blanks, MD;  Location: [facility];  Service: Open Heart Surgery;  Laterality: N/A;  . TOTAL KNEE  ARTHROPLASTY Right 11/08/2017  . TOTAL KNEE ARTHROPLASTY Right 11/08/2017   Procedure: TOTAL KNEE ARTHROPLASTY;  Surgeon: Melrose Nakayama, MD;  Location: [facility];  Service: Orthopedics;  Laterality: Right;  . TRANSCATHETER AORTIC VALVE REPLACEMENT, TRANSFEMORAL N/A 03/15/2017   Procedure: TRANSCATHETER AORTIC VALVE REPLACEMENT, TRANSFEMORAL;  Surgeon: Burnell Blanks, MD;  Location: [facility];  Service: Open Heart Surgery;  Laterality: N/A;  . VEIN SURGERY      Current Outpatient Medications  Medication Sig Dispense Refill  . acetaminophen (TYLENOL) 500 MG tablet Take 500 mg by mouth every 4 (four) hours as needed for moderate pain or headache.    [name] amiodarone (PACERONE) 200 MG tablet Take 1 tablet (200 mg total) by mouth 2 (two) times daily. 90 tablet 3  . apixaban (ELIQUIS) 5 MG TABS tablet Take 1 tablet (5 mg total) by mouth 2 (two) times daily. 180 tablet 3  . ferrous sulfate 325 (65 FE) MG tablet Take 325 mg by mouth 2 (two) times daily with a meal.     . fish oil-omega-3 fatty acids 1000 MG capsule Take 1 g by mouth daily at 12 noon.     . furosemide (LASIX) 80 MG tablet Take 1 tablet (80 mg total) by mouth daily. (Patient taking differently: Take 80 mg by mouth 2 (two) times daily. ) 90 tablet 3  . glimepiride (AMARYL) 2 MG tablet Take one tablet twice daily 180 tablet 4  . glucose blood test strip USE AS INSTRUCTED TO CHECK BLOOD SUGAR TWICE A DAY DX:E11.65 100 each 2  . insulin lispro (HUMALOG KWIKPEN) 100 UNIT/ML KwikPen Inject 12-14 Units into the skin. Inject 10-12 units under the skin before or directly after the main meal of the day.     . Insulin Pen Needle (B-Todd UF III MINI PEN NEEDLES) 31G X 5 MM MISC USE 2 PEN NEEDLE PER DAY WITH HUMALOG AND VICTOZA 200 each 3  . liraglutide (VICTOZA) 18 MG/3ML SOPN INJECT 1.8 MG UNDER THE SKIN AT BEDTIME 9 pen 2  . metFORMIN (GLUMETZA) 500 MG (MOD) 24 hr tablet Take 1 tablet (500 mg total) by mouth 2 (two) times daily with a meal. (morning &  noon) 180 tablet 4  . metolazone (ZAROXOLYN) 5 MG tablet Take 1 tablet (5 mg total) by mouth once a week. On Monday 30 tablet 1  . Multiple Vitamin (MULTIVITAMIN WITH MINERALS) TABS tablet Take 1 tablet by mouth daily. Mens One a day Vit    . Multiple Vitamins-Minerals (PRESERVISION AREDS 2 PO) Take 1 capsule by mouth 2 (two) times daily.    [name] olmesartan (BENICAR) 20 MG tablet Take 1 tablet (20 mg total) by mouth daily. 90 tablet 3  . pantoprazole (PROTONIX) 40 MG tablet Take 1 tablet (40 mg total) by mouth 2 (two) times daily. 180 tablet 3  . pravastatin (PRAVACHOL) 80 MG tablet Take 1 tablet (80 mg total) by mouth every evening. 90 tablet 1  . tamsulosin (FLOMAX) 0.4 MG CAPS capsule Take 1 capsule daily 90 capsule 1  . vitamin C (ASCORBIC ACID) 500 MG tablet Take 500  mg by mouth daily.    . insulin NPH Human (HUMULIN N) 100 UNIT/ML injection Inject 26 units of Humulin N under the skin once daily at bedtime. 30 mL 1   No current facility-administered medications for this encounter.     Allergies  Allergen Reactions  . Bactrim [Sulfamethoxazole-Trimethoprim] Nausea And Vomiting and Other (See Comments)    Bleeding and ulcers  . Morphine And Related Nausea And Vomiting    Social History   Socioeconomic History  . Marital status: Married    Spouse name: Not on file  . Number of children: 4  . Years of education: Not on file  . Highest education level: Not on file  Occupational History  . Occupation: Construction-Retired  Social Needs  . Financial resource strain: Not on file  . Food insecurity:    Worry: Not on file    Inability: Not on file  . Transportation needs:    Medical: Not on file    Non-medical: Not on file  Tobacco Use  . Smoking status: Never Smoker  . Smokeless tobacco: Never Used  Substance and Sexual Activity  . Alcohol use: No  . Drug use: No  . Sexual activity: Not on file  Lifestyle  . Physical activity:    Days per week: Not on file    Minutes per  session: Not on file  . Stress: Not on file  Relationships  . Social connections:    Talks on phone: Not on file    Gets together: Not on file    Attends religious service: Not on file    Active member of club or organization: Not on file    Attends meetings of clubs or organizations: Not on file    Relationship status: Not on file  . Intimate partner violence:    Fear of current or ex partner: Not on file    Emotionally abused: Not on file    Physically abused: Not on file    Forced sexual activity: Not on file  Other Topics Concern  . Not on file  Social History Narrative  . Not on file     ROS- All systems are reviewed and negative except as per the HPI above.  Physical Exam: Vitals:   01/11/19 1144  BP: 118/62  Pulse: 63  Weight: 103.4 kg  Height: 5\' 8"  (1.727 m)    GEN- The patient is well appearing obese elderly male, alert and oriented x 3 today.   Head- normocephalic, atraumatic Eyes-  Sclera clear, conjunctiva pink Ears- hearing intact Oropharynx- clear Neck- supple  Lungs- Clear to ausculation bilaterally, normal work of breathing Heart- irregular rate and rhythm, no murmurs, rubs or gallops  GI- soft, NT, ND, + BS Extremities- no clubbing, cyanosis, 1+ edema R>L MS- no significant deformity or atrophy Skin- no rash or lesion Psych- euthymic mood, full affect Neuro- strength and sensation are intact  Wt Readings from Last 3 Encounters:  01/11/19 103.4 kg  01/05/19 103.4 kg  12/27/18 110.2 kg    EKG today demonstrates afib HR 63, LAD, RBBB, PVC, QRS 15, QTc 460  Echo 02/06/18 demonstrated  - Left ventricle: The cavity size was normal. Wall thickness was   increased in a pattern of moderate LVH. The estimated ejection   fraction was 43%. Diffuse hypokinesis. The study is not   technically sufficient to allow evaluation of LV diastolic   function. Ejection fraction (MOD, 2-plane): 43%. - Aortic valve: s/p TAVR valve. No obstruction. Trivial  paravalvular leak. Mean gradient (S): 10 mm Hg. Peak gradient   (S): 24 mm Hg. Valve area (VTI): 1.18 cm^2. Valve area (Vmax):   1.09 cm^2. Valve area (Vmean): 1.2 cm^2. - Left atrium: The atrium was mildly dilated. - Right ventricle: The cavity size was mildly dilated. - Atrial septum: Small atrial septal defect. - Tricuspid valve: There was moderate regurgitation. - Pulmonary arteries: PA peak pressure: 47 mm Hg (S). - Inferior vena cava: The vessel was dilated. The respirophasic   diameter changes were blunted (< 50%), consistent with elevated   central venous pressure.  Epic records are reviewed at length today  Assessment and Plan:  1. Persistent atrial fibrillation The patient has persistent atrial fibrillation. On amiodarone and Eliquis.  Likely contributing to heart failure symptoms.  Will arrange for DCCV. This has been scheduled for 5/14. Continue amiodarone 200 mg BID for one week, then decrease to 200 mg daily. Continue Eliquis 5 mg BID with no missed doses. Continue following Cr, if shows Cr >1.5 persistently, may need to consider decreasing to 2.5 mg BID dosing.  This patients CHA2DS2-VASc Score and unadjusted Ischemic Stroke Rate (% per year) is equal to 9.7 % stroke rate/year from a score of 6  Above score calculated as 1 point each if present [CHF, HTN, DM, Vascular=MI/PAD/Aortic Plaque, Age if 65-74, or Male] Above score calculated as 2 points each if present [Age > 75, or Stroke/TIA/TE]  2. Chronic combined systolic and diastolic CHF Appears euvolemic today. Bmet drawn prior to appt. Continue current therapy and 2 gram sodium restriction. Plans for afib as above.  3. Obesity Body mass index is 34.67 kg/m. Lifestyle modification was discussed at length including regular exercise and weight reduction.  4. Obstructive sleep apnea The importance of adequate treatment of sleep apnea was discussed today in order to improve our ability to maintain sinus rhythm  long term. Pt will need repeat sleep study. Followed by Dr Radford Pax.  5. HTN Stable, no changes today.    Follow up with AF clinic one week after DCCV. Dr Tamala Julian as scheduled.   [facility] [address] [place], [place] [address] [phone] 01/11/2019 1:10 PM

## 2019-01-12 ENCOUNTER — Telehealth (HOSPITAL_COMMUNITY): Payer: Self-pay | Admitting: *Deleted

## 2019-01-12 ENCOUNTER — Encounter: Payer: Self-pay | Admitting: Family Medicine

## 2019-01-12 ENCOUNTER — Other Ambulatory Visit (HOSPITAL_COMMUNITY): Payer: Self-pay | Admitting: *Deleted

## 2019-01-12 DIAGNOSIS — N184 Chronic kidney disease, stage 4 (severe): Secondary | ICD-10-CM | POA: Insufficient documentation

## 2019-01-12 NOTE — Telephone Encounter (Signed)
Patient is scheduled for cardioversion 5/14 @ 945am. Pt is instructed to remain NPO after MN. Sip of water with morning medications except diuretics and diabetic medications. No missed doses of eliquis. Pts wife understands they will be contacted to set up appt for covid screening by PAT prior to cardioversion and should remain in quarantine until day of cardioversion once tested. Wife also understands she will remain in car for cardioversion and will be notified when pt ready for discharge. Follow up appointment made. Pt's wife verbalized understanding.

## 2019-01-13 NOTE — Progress Notes (Signed)
The Eliquis should be reduced to 2.5 mg BID to decrease the risk of bleeding.

## 2019-01-13 NOTE — Telephone Encounter (Signed)
Thanks

## 2019-01-15 ENCOUNTER — Other Ambulatory Visit (HOSPITAL_COMMUNITY)
Admission: RE | Admit: 2019-01-15 | Discharge: 2019-01-15 | Disposition: A | Discharge status: Home / Self Care | Payer: Medicare Other | Source: Ambulatory Visit | Attending: Cardiovascular Disease | Admitting: Cardiovascular Disease

## 2019-01-15 ENCOUNTER — Other Ambulatory Visit: Payer: Self-pay

## 2019-01-15 ENCOUNTER — Telehealth: Payer: Self-pay | Admitting: Interventional Cardiology

## 2019-01-15 DIAGNOSIS — Z1159 Encounter for screening for other viral diseases: Secondary | ICD-10-CM | POA: Diagnosis present

## 2019-01-15 MED ORDER — APIXABAN 2.5 MG PO TABS: 2.5000 mg | ORAL_TABLET | Freq: Two times a day (BID) | ORAL | 11 refills | Status: DC

## 2019-01-15 NOTE — Telephone Encounter (Signed)
Spoke with wife and made her aware of recommendations.  Wife verbalized understanding and was in agreement with this plan.  

## 2019-01-15 NOTE — Telephone Encounter (Signed)
Author: Belva Crome, MD Service: Cardiology Author Type: Physician  Filed: 01/13/2019 12:28 PM Date of Service: 01/11/2019 11:30 AM Status: Signed  Editor: Belva Crome, MD (Physician)     Show:Clear all [x] Manual[] Template[] Copied  Added by: [x] Smith, Henry W, MD  [] Hover for details The Eliquis should be reduced to 2.5 mg BID to decrease the risk of bleeding.       Left message to call back

## 2019-01-16 LAB — NOVEL CORONAVIRUS, NAA (HOSP ORDER, SEND-OUT TO REF LAB; TAT 18-24 HRS): SARS-CoV-2, NAA: NOT DETECTED

## 2019-01-17 NOTE — Telephone Encounter (Signed)
RE: precert  Todd Romero, CMA  Freada Bergeron, CMA        Patient has [facility]. No PA is required. Ok to schedule.     Patient/Wife is aware and agreeable to Home Sleep Study through [facility]. Patient is scheduled for 02/07/19 at 9:30 to pick up home sleep kit and meet with Respiratory therapist at [facility]. Patient is aware that if this appointment date and time does not work for them they should contact Artis Delay directly at [phone]. Patient is aware that a sleep packet will be sent from [facility] in week. Patient/Wife is agreeable to treatment and thankful for call

## 2019-01-17 NOTE — Progress Notes (Signed)
Spoke with pt who states he has not been experiencing SOB, fever or cough since being tested for the COVID 19. He also has not left his house since he was tested. Jobe Igo, RN

## 2019-01-18 ENCOUNTER — Ambulatory Visit (HOSPITAL_COMMUNITY): Payer: Medicare Other | Admitting: Registered Nurse

## 2019-01-18 ENCOUNTER — Encounter (HOSPITAL_COMMUNITY)
Admission: RE | Disposition: A | Discharge status: Home / Self Care | Payer: Self-pay | Source: Home / Self Care | Attending: Cardiovascular Disease

## 2019-01-18 ENCOUNTER — Other Ambulatory Visit: Payer: Self-pay

## 2019-01-18 ENCOUNTER — Encounter (HOSPITAL_COMMUNITY): Payer: Self-pay

## 2019-01-18 ENCOUNTER — Ambulatory Visit (HOSPITAL_COMMUNITY)
Admission: RE | Admit: 2019-01-18 | Discharge: 2019-01-18 | Disposition: A | Discharge status: Home / Self Care | Payer: Medicare Other | Attending: Cardiovascular Disease | Admitting: Cardiovascular Disease

## 2019-01-18 DIAGNOSIS — Z885 Allergy status to narcotic agent status: Secondary | ICD-10-CM | POA: Insufficient documentation

## 2019-01-18 DIAGNOSIS — E7849 Other hyperlipidemia: Secondary | ICD-10-CM | POA: Insufficient documentation

## 2019-01-18 DIAGNOSIS — Z8249 Family history of ischemic heart disease and other diseases of the circulatory system: Secondary | ICD-10-CM | POA: Diagnosis not present

## 2019-01-18 DIAGNOSIS — E669 Obesity, unspecified: Secondary | ICD-10-CM | POA: Diagnosis not present

## 2019-01-18 DIAGNOSIS — Z6834 Body mass index (BMI) 34.0-34.9, adult: Secondary | ICD-10-CM | POA: Insufficient documentation

## 2019-01-18 DIAGNOSIS — Z7901 Long term (current) use of anticoagulants: Secondary | ICD-10-CM | POA: Diagnosis not present

## 2019-01-18 DIAGNOSIS — I480 Paroxysmal atrial fibrillation: Secondary | ICD-10-CM | POA: Diagnosis not present

## 2019-01-18 DIAGNOSIS — I4510 Unspecified right bundle-branch block: Secondary | ICD-10-CM | POA: Insufficient documentation

## 2019-01-18 DIAGNOSIS — I130 Hypertensive heart and chronic kidney disease with heart failure and stage 1 through stage 4 chronic kidney disease, or unspecified chronic kidney disease: Secondary | ICD-10-CM | POA: Diagnosis not present

## 2019-01-18 DIAGNOSIS — Z79899 Other long term (current) drug therapy: Secondary | ICD-10-CM | POA: Diagnosis not present

## 2019-01-18 DIAGNOSIS — N179 Acute kidney failure, unspecified: Secondary | ICD-10-CM | POA: Insufficient documentation

## 2019-01-18 DIAGNOSIS — I4819 Other persistent atrial fibrillation: Secondary | ICD-10-CM

## 2019-01-18 DIAGNOSIS — Z881 Allergy status to other antibiotic agents status: Secondary | ICD-10-CM | POA: Insufficient documentation

## 2019-01-18 DIAGNOSIS — Z794 Long term (current) use of insulin: Secondary | ICD-10-CM | POA: Insufficient documentation

## 2019-01-18 DIAGNOSIS — Z952 Presence of prosthetic heart valve: Secondary | ICD-10-CM | POA: Insufficient documentation

## 2019-01-18 DIAGNOSIS — I2510 Atherosclerotic heart disease of native coronary artery without angina pectoris: Secondary | ICD-10-CM | POA: Insufficient documentation

## 2019-01-18 DIAGNOSIS — Z1159 Encounter for screening for other viral diseases: Secondary | ICD-10-CM | POA: Insufficient documentation

## 2019-01-18 DIAGNOSIS — E1122 Type 2 diabetes mellitus with diabetic chronic kidney disease: Secondary | ICD-10-CM | POA: Diagnosis not present

## 2019-01-18 DIAGNOSIS — N183 Chronic kidney disease, stage 3 (moderate): Secondary | ICD-10-CM | POA: Diagnosis not present

## 2019-01-18 DIAGNOSIS — I5043 Acute on chronic combined systolic (congestive) and diastolic (congestive) heart failure: Secondary | ICD-10-CM | POA: Diagnosis not present

## 2019-01-18 DIAGNOSIS — G4733 Obstructive sleep apnea (adult) (pediatric): Secondary | ICD-10-CM | POA: Insufficient documentation

## 2019-01-18 DIAGNOSIS — Z9049 Acquired absence of other specified parts of digestive tract: Secondary | ICD-10-CM | POA: Diagnosis not present

## 2019-01-18 HISTORY — PX: CARDIOVERSION: SHX1299

## 2019-01-18 LAB — GLUCOSE, CAPILLARY: Glucose-Capillary: 85 mg/dL (ref 70–99)

## 2019-01-18 SURGERY — CARDIOVERSION
Anesthesia: General

## 2019-01-18 MED ORDER — SODIUM CHLORIDE 0.9 % IV SOLN
INTRAVENOUS | Status: DC
  Administered 2019-01-18: 09:00:00 via INTRAVENOUS

## 2019-01-18 MED ORDER — PROPOFOL 10 MG/ML IV BOLUS
INTRAVENOUS | Status: DC | PRN
  Administered 2019-01-18: 30 mg via INTRAVENOUS
  Administered 2019-01-18 (×2): 10 mg via INTRAVENOUS
  Administered 2019-01-18: 50 mg via INTRAVENOUS

## 2019-01-18 MED ORDER — LIDOCAINE 2% (20 MG/ML) 5 ML SYRINGE
INTRAMUSCULAR | Status: DC | PRN
  Administered 2019-01-18: 60 mg via INTRAVENOUS

## 2019-01-18 NOTE — Anesthesia Preprocedure Evaluation (Signed)
Anesthesia Evaluation  Patient identified by MRN, date of birth, ID band Patient awake    Reviewed: Allergy & Precautions, NPO status , Patient's Chart, lab work & pertinent test results  Airway Mallampati: III  TM Distance: >3 FB Neck ROM: Full    Dental no notable dental hx. (+) Teeth Intact   Pulmonary shortness of breath and with exertion, sleep apnea , pneumonia, resolved,    Pulmonary exam normal breath sounds clear to auscultation       Cardiovascular hypertension, Pt. on medications + CAD and +CHF  + dysrhythmias Atrial Fibrillation + Valvular Problems/Murmurs AS  Rhythm:Irregular Rate:Normal  S/P TAVR   Neuro/Psych negative neurological ROS  negative psych ROS   GI/Hepatic Neg liver ROS, Hx/o GI bleed   Endo/Other  diabetes, Well Controlled, Type 2, Oral Hypoglycemic Agents, Insulin DependentObesity  Renal/GU Renal disease  negative genitourinary   Musculoskeletal  (+) Arthritis , Osteoarthritis,    Abdominal (+) + obese,   Peds  Hematology  (+) anemia , Eliquis- last dose   Anesthesia Other Findings   Reproductive/Obstetrics                             Anesthesia Physical  Anesthesia Plan  ASA: III  Anesthesia Plan: General   Post-op Pain Management:    Induction: Intravenous  PONV Risk Score and Plan: 2 and Ondansetron, Propofol infusion and Treatment may vary due to age or medical condition  Airway Management Planned: Mask  Additional Equipment:   Intra-op Plan:   Post-operative Plan:   Informed Consent: I have reviewed the patients History and Physical, chart, labs and discussed the procedure including the risks, benefits and alternatives for the proposed anesthesia with the patient or authorized representative who has indicated his/her understanding and acceptance.     Dental advisory given  Plan Discussed with: CRNA  Anesthesia Plan Comments:          Anesthesia Quick Evaluation

## 2019-01-18 NOTE — Interval H&P Note (Signed)
History and Physical Interval Note:  01/18/2019 9:43 AM  Todd Romero  has presented today for surgery, with the diagnosis of AFIB.  The various methods of treatment have been discussed with the patient and family. After consideration of risks, benefits and other options for treatment, the patient has consented to  Procedure(s): CARDIOVERSION (N/A) as a surgical intervention.  The patient's history has been reviewed, patient examined, no change in status, stable for surgery.  I have reviewed the patient's chart and labs.  Questions were answered to the patient's satisfaction.     Skeet Latch, MD

## 2019-01-18 NOTE — Anesthesia Procedure Notes (Addendum)
Date/Time: 01/18/2019 12:12 PM Performed by: Trinna Post., CRNA Pre-anesthesia Checklist: Patient identified, Emergency Drugs available, Suction available, Patient being monitored and Timeout performed Patient Re-evaluated:Patient Re-evaluated prior to induction Oxygen Delivery Method: Ambu bag Preoxygenation: Pre-oxygenation with 100% oxygen Induction Type: IV induction Placement Confirmation: positive ETCO2

## 2019-01-18 NOTE — Discharge Instructions (Signed)
Electrical Cardioversion, Care After °This sheet gives you information about how to care for yourself after your procedure. Your health care provider may also give you more specific instructions. If you have problems or questions, contact your health care provider. °What can I expect after the procedure? °After the procedure, it is common to have: °· Some redness on the skin where the shocks were given. °Follow these instructions at home: ° °· Do not drive for 24 hours if you were given a medicine to help you relax (sedative). °· Take over-the-counter and prescription medicines only as told by your health care provider. °· Ask your health care provider how to check your pulse. Check it often. °· Rest for 48 hours after the procedure or as told by your health care provider. °· Avoid or limit your caffeine use as told by your health care provider. °Contact a health care provider if: °· You feel like your heart is beating too quickly or your pulse is not regular. °· You have a serious muscle cramp that does not go away. °Get help right away if: ° °· You have discomfort in your chest. °· You are dizzy or you feel faint. °· You have trouble breathing or you are short of breath. °· Your speech is slurred. °· You have trouble moving an arm or leg on one side of your body. °· Your fingers or toes turn cold or blue. °This information is not intended to replace advice given to you by your health care provider. Make sure you discuss any questions you have with your health care provider. °Document Released: 06/13/2013 Document Revised: 03/26/2016 Document Reviewed: 02/27/2016 °Elsevier Interactive Patient Education © 2019 [employer]. ° °

## 2019-01-18 NOTE — Progress Notes (Signed)
Patient's original scheduled time for cardioversion was 1000, however, patient ate a half of orange this morning around 530 am due to a dropping blood sugar. Spoke with MD Germeroth with anesthesia and said it would need to be 6 hours of NPO, so patient procedure pushed back to 1130. Notified MD Oval Linsey who is performing procedure. Patient wife updated as well.

## 2019-01-18 NOTE — Progress Notes (Signed)
Patient's name not populating in the glucometer, blood sugar was 85.

## 2019-01-18 NOTE — Transfer of Care (Signed)
Immediate Anesthesia Transfer of Care Note  Patient: Todd Romero  Procedure(s) Performed: CARDIOVERSION (N/A )  Patient Location: PACU and Endoscopy Unit  Anesthesia Type:General  Level of Consciousness: awake, alert  and oriented  Airway & Oxygen Therapy: Patient Spontanous Breathing  Post-op Assessment: Report given to RN and Post -op Vital signs reviewed and stable  Post vital signs: Reviewed and stable  Last Vitals:  Vitals Value Taken Time  BP    Temp    Pulse    Resp    SpO2      Last Pain:  Vitals:   01/18/19 0916  TempSrc: Oral  PainSc: 0-No pain         Complications: No apparent anesthesia complications

## 2019-01-18 NOTE — CV Procedure (Signed)
Electrical Cardioversion Procedure Note GRYPHON VANDERVEEN [id number] [birth year]  Procedure: Electrical Cardioversion Indications:  Atrial Fibrillation  Procedure Details Consent: Risks of procedure as well as the alternatives and risks of each were explained to the (patient/caregiver).  Consent for procedure obtained. Time Out: Verified patient identification, verified procedure, site/side was marked, verified correct patient position, special equipment/implants available, medications/allergies/relevent history reviewed, required imaging and test results available.  Performed  Patient placed on cardiac monitor, pulse oximetry, supplemental oxygen as necessary.  Sedation given: propofol Pacer pads placed anterior and posterior chest.  Cardioverted 2 time(s).  Cardioverted at 150J then converted to atrial flutter.  200J converted to sinus bradycardia with PVCs..  Evaluation Findings: Post procedure EKG shows: sinus bradycardia with PVCs Complications: None Patient did tolerate procedure well.   Skeet Latch, MD 01/18/2019, 12:32 PM

## 2019-01-19 ENCOUNTER — Encounter (HOSPITAL_COMMUNITY): Payer: Self-pay | Admitting: Cardiovascular Disease

## 2019-01-22 ENCOUNTER — Encounter (HOSPITAL_COMMUNITY): Payer: Self-pay | Admitting: Physician Assistant

## 2019-01-22 ENCOUNTER — Ambulatory Visit (HOSPITAL_COMMUNITY)
Admission: RE | Admit: 2019-01-22 | Discharge: 2019-01-22 | Disposition: A | Discharge status: Home / Self Care | Payer: Medicare Other | Source: Ambulatory Visit | Attending: Physician Assistant | Admitting: Physician Assistant

## 2019-01-22 ENCOUNTER — Other Ambulatory Visit: Payer: Self-pay

## 2019-01-22 VITALS — BP 154/72 | HR 58 | Temp 99.2°F | Ht 68.0 in | Wt 242.0 lb

## 2019-01-22 DIAGNOSIS — I4819 Other persistent atrial fibrillation: Secondary | ICD-10-CM

## 2019-01-22 DIAGNOSIS — I480 Paroxysmal atrial fibrillation: Secondary | ICD-10-CM | POA: Diagnosis not present

## 2019-01-22 LAB — BASIC METABOLIC PANEL
Anion gap: 14 (ref 5–15)
BUN: 79 mg/dL — ABNORMAL HIGH (ref 8–23)
CO2: 19 mmol/L — ABNORMAL LOW (ref 22–32)
Calcium: 8.9 mg/dL (ref 8.9–10.3)
Chloride: 103 mmol/L (ref 98–111)
Creatinine, Ser: 2.24 mg/dL — ABNORMAL HIGH (ref 0.61–1.24)
GFR calc Af Amer: 31 mL/min — ABNORMAL LOW (ref >60)
GFR calc non Af Amer: 26 mL/min — ABNORMAL LOW (ref >60)
Glucose, Bld: 176 mg/dL — ABNORMAL HIGH (ref 70–99)
Potassium: 4.4 mmol/L (ref 3.5–5.1)
Sodium: 136 mmol/L (ref 135–145)

## 2019-01-22 MED ORDER — AMIODARONE HCL 200 MG PO TABS: 200.0000 mg | ORAL_TABLET | Freq: Every day | ORAL | 3 refills | Status: DC

## 2019-01-22 NOTE — Progress Notes (Signed)
Primary Care Physician: Ma Hillock, DO Primary Cardiologist: Dr Tamala Julian Primary Electrophysiologist: Dr Rayann Heman Referring Physician: Dr Rosilyn Mings D Genrich is a 85 y.o. male with a history of persistent atrial fibrillation who presents for follow up in the [facility]. Patient now s/p DCCV 01/18/19. He reports that overall he feels much better with improved SOB and increased energy level. Two days ago he did have some bradycardia (noted on home BP machine) and felt fatigued. He also noted weight gain and took an extra dose of Lasix which did help.   Today, he denies symptoms of palpitations, chest pain, dizziness, presyncope, syncope, snoring, daytime somnolence, bleeding, or neurologic sequela. The patient is tolerating medications without difficulties and is otherwise without complaint today.    Atrial Fibrillation Risk Factors:  he does have symptoms or diagnosis of sleep apnea. he is not compliant with CPAP therapy.  he has a BMI of Body mass index is 36.8 kg/m.[name] Filed Weights   01/22/19 1434  Weight: 109.8 kg    Family History  Problem Relation Age of Onset  . Heart failure Father   . Emphysema Father   . Hypertension Mother   . Cancer Sister   . Healthy Sister      Atrial Fibrillation Management history:  Previous antiarrhythmic drugs: amiodarone Previous cardioversions: 03/2018, 01/18/19 Previous ablations: none CHADS2VASC score: 6 Anticoagulation history: Eliquis   Past Medical History:  Diagnosis Date  . Anemia   . Arthritis   . CAD (coronary artery disease)    a. cardiac cath 12/2016 showing moderate AS, elevated LVEDP, heavy 3V coronary calcification, 100% mD2, 30-50% LAD, 50-90% stenosis of Cx proximal to origin of L-PDA, RCA not engaged due to poor catheter control (difficult procedure) - coronary status was essentially unchanged from prior.  . Chronic diastolic CHF (congestive heart failure) ([place])   . Degenerative  arthritis   . Diabetes ([place])   . Dyspnea   . Essential hypertension   . Hyperlipidemia   . New onset a-fib ([place]) 03/04/2018  . Obesity (BMI 30-39.9) 06/18/2015  . OSA (obstructive sleep apnea)    Severe with AHI 27/hr now on CPAP  not compliant with treatment.  . Peritonitis ([place]) 1985?  [name] Pneumonia    "6 months - 85 years old"  . Pure hypercholesterolemia   . RBBB   . [facility] spotted fever   . 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 Severe aortic stenosis    a. s/p TAVR 03/2017.  . Type II or unspecified type diabetes mellitus without mention of complication, not stated as uncontrolled    Past Surgical History:  Procedure Laterality Date  . APPENDECTOMY    . CARDIOVERSION N/A 04/03/2018   Procedure: CARDIOVERSION;  Surgeon: Josue Hector, MD;  Location: [facility] ENDOSCOPY;  Service: Cardiovascular;  Laterality: N/A;  . CARDIOVERSION N/A 01/18/2019   Procedure: CARDIOVERSION;  Surgeon: Skeet Latch, MD;  Location: [place];  Service: Cardiovascular;  Laterality: N/A;  . CARPAL TUNNEL RELEASE    . COLECTOMY     partial  . COLONOSCOPY WITH PROPOFOL N/A 11/18/2017   Procedure: COLONOSCOPY WITH PROPOFOL;  Surgeon: Carol Ada, MD;  Location: [facility] ENDOSCOPY;  Service: Endoscopy;  Laterality: N/A;  . ESOPHAGOGASTRODUODENOSCOPY N/A 11/18/2017   Procedure: ESOPHAGOGASTRODUODENOSCOPY (EGD);  Surgeon: Carol Ada, MD;  Location: [facility] ENDOSCOPY;  Service: Endoscopy;  Laterality: N/A;  .  EYE SURGERY Bilateral    cataracts removed, lens placed  . JOINT REPLACEMENT    . KNEE CARTILAGE SURGERY    . PARTIAL COLECTOMY    . RIGHT/LEFT HEART CATH AND CORONARY ANGIOGRAPHY N/A 12/28/2016   Procedure: Right/Left Heart Cath and Coronary Angiography;  Surgeon: Belva Crome, MD;  Location: [facility] CV LAB;  Service: Cardiovascular;  Laterality: N/A;  . TEE WITHOUT  CARDIOVERSION N/A 03/15/2017   Procedure: TRANSESOPHAGEAL ECHOCARDIOGRAM (TEE);  Surgeon: Burnell Blanks, MD;  Location: [facility];  Service: Open Heart Surgery;  Laterality: N/A;  . TOTAL KNEE ARTHROPLASTY Right 11/08/2017  . TOTAL KNEE ARTHROPLASTY Right 11/08/2017   Procedure: TOTAL KNEE ARTHROPLASTY;  Surgeon: Melrose Nakayama, MD;  Location: [facility];  Service: Orthopedics;  Laterality: Right;  . TRANSCATHETER AORTIC VALVE REPLACEMENT, TRANSFEMORAL N/A 03/15/2017   Procedure: TRANSCATHETER AORTIC VALVE REPLACEMENT, TRANSFEMORAL;  Surgeon: Burnell Blanks, MD;  Location: [place] [facility];  Service: Open Heart Surgery;  Laterality: N/A;  . VEIN SURGERY      Current Outpatient Medications  Medication Sig Dispense Refill  . acetaminophen (TYLENOL) 500 MG tablet Take 500 mg by mouth every 4 (four) hours as needed for moderate pain or headache.    [name] amiodarone (PACERONE) 200 MG tablet Take 1 tablet (200 mg total) by mouth daily. 90 tablet 3  . apixaban (ELIQUIS) 2.5 MG TABS tablet Take 1 tablet (2.5 mg total) by mouth 2 (two) times daily. 60 tablet 11  . ferrous sulfate 325 (65 FE) MG tablet Take 325 mg by mouth 2 (two) times daily with a meal.     . fish oil-omega-3 fatty acids 1000 MG capsule Take 1 g by mouth daily.     . furosemide (LASIX) 80 MG tablet Take 1 tablet (80 mg total) by mouth daily. 90 tablet 3  . glimepiride (AMARYL) 2 MG tablet Take one tablet twice daily (Patient taking differently: Take 2 mg by mouth 2 (two) times a day. Take one tablet twice daily) 180 tablet 4  . glucose blood test strip USE AS INSTRUCTED TO CHECK BLOOD SUGAR TWICE A DAY DX:E11.65 100 each 2  . insulin lispro (HUMALOG KWIKPEN) 100 UNIT/ML KwikPen Inject 10-12 Units into the skin See admin instructions. Inject 10-12 units under the skin before or directly after the main meal of the day.     . insulin NPH Human (HUMULIN N) 100 UNIT/ML injection Inject 26 units of Humulin N under the skin once daily at bedtime.  (Patient taking differently: Inject 26 Units into the skin at bedtime. Inject 26 units of Humulin N under the skin once daily at bedtime. ) 30 mL 1  . Insulin Pen Needle (B-D UF III MINI PEN NEEDLES) 31G X 5 MM MISC USE 2 PEN NEEDLE PER DAY WITH HUMALOG AND VICTOZA 200 each 3  . liraglutide (VICTOZA) 18 MG/3ML SOPN INJECT 1.8 MG UNDER THE SKIN AT BEDTIME (Patient taking differently: Inject 1.8 mg into the skin at bedtime. INJECT 1.8 MG UNDER THE SKIN AT BEDTIME) 9 pen 2  . metFORMIN (GLUMETZA) 500 MG (MOD) 24 hr tablet Take 1 tablet (500 mg total) by mouth 2 (two) times daily with a meal. (morning & noon) 180 tablet 4  . metolazone (ZAROXOLYN) 5 MG tablet Take 1 tablet (5 mg total) by mouth once a week. On Monday 30 tablet 1  . Multiple Vitamin (MULTIVITAMIN WITH MINERALS) TABS tablet Take 1 tablet by mouth daily. Mens One a day Vit    . Multiple  Vitamins-Minerals (PRESERVISION AREDS 2 PO) Take 1 capsule by mouth 2 (two) times daily.    [name] olmesartan (BENICAR) 20 MG tablet Take 1 tablet (20 mg total) by mouth daily. 90 tablet 3  . pantoprazole (PROTONIX) 40 MG tablet Take 1 tablet (40 mg total) by mouth 2 (two) times daily. 180 tablet 3  . pravastatin (PRAVACHOL) 80 MG tablet Take 1 tablet (80 mg total) by mouth every evening. 90 tablet 1  . tamsulosin (FLOMAX) 0.4 MG CAPS capsule Take 1 capsule daily (Patient taking differently: Take 0.4 mg by mouth daily. Take 1 capsule daily) 90 capsule 1  . vitamin C (ASCORBIC ACID) 500 MG tablet Take 500 mg by mouth daily.     No current facility-administered medications for this encounter.     Allergies  Allergen Reactions  . Bactrim [Sulfamethoxazole-Trimethoprim] Nausea And Vomiting and Other (See Comments)    Bleeding and ulcers  . Morphine And Related Nausea And Vomiting    Social History   Socioeconomic History  . Marital status: Married    Spouse name: Not on file  . Number of children: 4  . Years of education: Not on file  . Highest  education level: Not on file  Occupational History  . Occupation: Construction-Retired  Social Needs  . Financial resource strain: Not on file  . Food insecurity:    Worry: Not on file    Inability: Not on file  . Transportation needs:    Medical: Not on file    Non-medical: Not on file  Tobacco Use  . Smoking status: Never Smoker  . Smokeless tobacco: Never Used  Substance and Sexual Activity  . Alcohol use: No  . Drug use: No  . Sexual activity: Not on file  Lifestyle  . Physical activity:    Days per week: Not on file    Minutes per session: Not on file  . Stress: Not on file  Relationships  . Social connections:    Talks on phone: Not on file    Gets together: Not on file    Attends religious service: Not on file    Active member of club or organization: Not on file    Attends meetings of clubs or organizations: Not on file    Relationship status: Not on file  . Intimate partner violence:    Fear of current or ex partner: Not on file    Emotionally abused: Not on file    Physically abused: Not on file    Forced sexual activity: Not on file  Other Topics Concern  . Not on file  Social History Narrative  . Not on file     ROS- All systems are reviewed and negative except as per the HPI above.  Physical Exam: Vitals:   01/22/19 1434  BP: (!) 154/72  Pulse: (!) 58  Temp: 99.2 F (37.3 C)  TempSrc: Temporal  SpO2: 97%  Weight: 109.8 kg  Height: 5\' 8"  (1.727 m)    GEN- The patient is well appearing obese elderly male, alert and oriented x 3 today.   HEENT-head normocephalic, atraumatic, sclera clear, conjunctiva pink, hearing intact, trachea midline. Lungs- Clear to ausculation bilaterally, normal work of breathing Heart- Regular rate and rhythm, no murmurs, rubs or gallops  GI- soft, NT, ND, + BS Extremities- no clubbing, cyanosis. Edema 1+ R>L MS- no significant deformity or atrophy Skin- no rash or lesion Psych- euthymic mood, full affect Neuro-  strength and sensation are intact   Wt Readings  from Last 3 Encounters:  01/22/19 109.8 kg  01/18/19 102.5 kg  01/11/19 103.4 kg    EKG today demonstrates SR HR 58, 1st degree AV block, RBBB, QRS 152, QTc 453  Echo 02/06/18 demonstrated  - Left ventricle: The cavity size was normal. Wall thickness was   increased in a pattern of moderate LVH. The estimated ejection   fraction was 43%. Diffuse hypokinesis. The study is not   technically sufficient to allow evaluation of LV diastolic   function. Ejection fraction (MOD, 2-plane): 43%. - Aortic valve: s/p TAVR valve. No obstruction. Trivial   paravalvular leak. Mean gradient (S): 10 mm Hg. Peak gradient   (S): 24 mm Hg. Valve area (VTI): 1.18 cm^2. Valve area (Vmax):   1.09 cm^2. Valve area (Vmean): 1.2 cm^2. - Left atrium: The atrium was mildly dilated. - Right ventricle: The cavity size was mildly dilated. - Atrial septum: Small atrial septal defect. - Tricuspid valve: There was moderate regurgitation. - Pulmonary arteries: PA peak pressure: 47 mm Hg (S). - Inferior vena cava: The vessel was dilated. The respirophasic   diameter changes were blunted (< 50%), consistent with elevated   central venous pressure.  Epic records are reviewed at length today  Assessment and Plan:  1. Persistent atrial fibrillation S/p DCCV on 01/18/19 with symptomatic improvement. Appears to be maintaining SR. Decrease amiodarone to 200 mg daily given bradycardia and prolonged AV conduction. Continue Eliquis 2.5 mg BID   This patients CHA2DS2-VASc Score and unadjusted Ischemic Stroke Rate (% per year) is equal to 9.7 % stroke rate/year from a score of 6  Above score calculated as 1 point each if present [CHF, HTN, DM, Vascular=MI/PAD/Aortic Plaque, Age if 65-74, or Male] Above score calculated as 2 points each if present [Age > 75, or Stroke/TIA/TE]  2. Chronic combined systolic and diastolic CHF Weight is up today but symptomatically much improved  since DCCV. Pt taken extra 40 mg dose of Lasix yesterday. Feels well with no SOB, orthopnea, PND, or edema above his baseline.  Will check Bmet today. Continue current therapy and 2 gram sodium restriction.  3. Obesity Body mass index is 36.8 kg/m. Lifestyle modification was discussed and encouraged including regular physical activity and weight reduction.  4. Obstructive sleep apnea The importance of adequate treatment of sleep apnea was discussed today in order to improve our ability to maintain sinus rhythm long term. Pt has repeat sleep study scheduled. Followed by Dr Radford Pax.  5. HTN Mildly elevated today. Pt brings in BP log which shows BP well controlled. No changes today.    Follow up with Dr Tamala Julian as scheduled. AF clinic in 2 months.   [facility] [address] [place], [place] [address] [phone] 01/22/2019 3:19 PM

## 2019-01-22 NOTE — Patient Instructions (Addendum)
Decrease amiodarone to 200 mg ONCE daily.  We will forward today's labs to Dr. Tamala Julian  We will see you back in 2 months.  Scheduled

## 2019-01-23 NOTE — Progress Notes (Signed)
Get him back in with [place] and ECG in June

## 2019-01-24 ENCOUNTER — Ambulatory Visit (HOSPITAL_COMMUNITY): Payer: Medicare Other | Admitting: Physician Assistant

## 2019-01-29 NOTE — Anesthesia Postprocedure Evaluation (Signed)
Anesthesia Post Note  Patient: Todd Romero  Procedure(s) Performed: CARDIOVERSION (N/A )     Patient location during evaluation: Endoscopy Anesthesia Type: General Level of consciousness: sedated and patient cooperative Pain management: pain level controlled Vital Signs Assessment: post-procedure vital signs reviewed and stable Respiratory status: spontaneous breathing Cardiovascular status: stable Anesthetic complications: no    Last Vitals:  Vitals:   01/18/19 1256 01/18/19 1306  BP: (!) 131/55 128/61  Pulse: (!) 56 (!) 57  Resp: 17 15  Temp:    SpO2: 99% 99%    Last Pain:  Vitals:   01/19/19 1035  TempSrc:   PainSc: 0-No pain                 Nolon Nations

## 2019-02-07 ENCOUNTER — Ambulatory Visit (HOSPITAL_BASED_OUTPATIENT_CLINIC_OR_DEPARTMENT_OTHER): Payer: Medicare Other | Attending: Cardiology | Admitting: Cardiology

## 2019-02-07 ENCOUNTER — Ambulatory Visit: Payer: Medicare Other | Admitting: Interventional Cardiology

## 2019-02-07 DIAGNOSIS — G4733 Obstructive sleep apnea (adult) (pediatric): Secondary | ICD-10-CM | POA: Diagnosis present

## 2019-02-08 ENCOUNTER — Ambulatory Visit: Payer: Medicare Other | Admitting: Interventional Cardiology

## 2019-02-12 ENCOUNTER — Other Ambulatory Visit: Payer: Self-pay

## 2019-02-13 ENCOUNTER — Other Ambulatory Visit (INDEPENDENT_AMBULATORY_CARE_PROVIDER_SITE_OTHER): Payer: Medicare Other

## 2019-02-13 ENCOUNTER — Telehealth: Payer: Self-pay

## 2019-02-13 ENCOUNTER — Other Ambulatory Visit: Payer: Self-pay

## 2019-02-13 DIAGNOSIS — Z794 Long term (current) use of insulin: Secondary | ICD-10-CM | POA: Diagnosis not present

## 2019-02-13 DIAGNOSIS — E1165 Type 2 diabetes mellitus with hyperglycemia: Secondary | ICD-10-CM

## 2019-02-13 LAB — COMPREHENSIVE METABOLIC PANEL
ALT: 16 U/L (ref 0–53)
AST: 21 U/L (ref 0–37)
Albumin: 3.7 g/dL (ref 3.5–5.2)
Alkaline Phosphatase: 67 U/L (ref 39–117)
BUN: 46 mg/dL — ABNORMAL HIGH (ref 6–23)
CO2: 26 mEq/L (ref 19–32)
Calcium: 8.8 mg/dL (ref 8.4–10.5)
Chloride: 101 mEq/L (ref 96–112)
Creatinine, Ser: 1.73 mg/dL — ABNORMAL HIGH (ref 0.40–1.50)
GFR: 37.92 mL/min — ABNORMAL LOW (ref >60.00)
Glucose, Bld: 91 mg/dL (ref 70–99)
Potassium: 4.2 mEq/L (ref 3.5–5.1)
Sodium: 136 mEq/L (ref 135–145)
Total Bilirubin: 0.5 mg/dL (ref 0.2–1.2)
Total Protein: 6.5 g/dL (ref 6.0–8.3)

## 2019-02-13 LAB — MICROALBUMIN / CREATININE URINE RATIO
Creatinine,U: 13.6 mg/dL
Microalb Creat Ratio: 5.3 mg/g (ref 0.0–30.0)
Microalb, Ur: 0.7 mg/dL (ref 0.0–1.9)

## 2019-02-13 LAB — LIPID PANEL
Cholesterol: 112 mg/dL (ref 0–200)
HDL: 48.1 mg/dL (ref >39.00)
LDL Cholesterol: 48 mg/dL (ref 0–99)
NonHDL: 64.39
Total CHOL/HDL Ratio: 2
Triglycerides: 84 mg/dL (ref 0.0–149.0)
VLDL: 16.8 mg/dL (ref 0.0–40.0)

## 2019-02-13 LAB — HEMOGLOBIN A1C: Hgb A1c MFr Bld: 8.2 % — ABNORMAL HIGH (ref 4.6–6.5)

## 2019-02-13 NOTE — Telephone Encounter (Signed)

## 2019-02-15 ENCOUNTER — Encounter: Payer: Self-pay | Admitting: Interventional Cardiology

## 2019-02-15 ENCOUNTER — Other Ambulatory Visit: Payer: Self-pay

## 2019-02-15 ENCOUNTER — Ambulatory Visit (INDEPENDENT_AMBULATORY_CARE_PROVIDER_SITE_OTHER): Payer: Medicare Other | Admitting: Endocrinology

## 2019-02-15 ENCOUNTER — Encounter: Payer: Self-pay | Admitting: Endocrinology

## 2019-02-15 ENCOUNTER — Ambulatory Visit (INDEPENDENT_AMBULATORY_CARE_PROVIDER_SITE_OTHER): Payer: Medicare Other | Admitting: Interventional Cardiology

## 2019-02-15 VITALS — BP 124/50 | HR 63 | Ht 68.0 in | Wt 233.4 lb

## 2019-02-15 VITALS — BP 122/62 | HR 32 | Ht 68.0 in | Wt 234.6 lb

## 2019-02-15 DIAGNOSIS — D508 Other iron deficiency anemias: Secondary | ICD-10-CM | POA: Diagnosis not present

## 2019-02-15 DIAGNOSIS — I10 Essential (primary) hypertension: Secondary | ICD-10-CM

## 2019-02-15 DIAGNOSIS — G4733 Obstructive sleep apnea (adult) (pediatric): Secondary | ICD-10-CM

## 2019-02-15 DIAGNOSIS — E1165 Type 2 diabetes mellitus with hyperglycemia: Secondary | ICD-10-CM

## 2019-02-15 DIAGNOSIS — Z79899 Other long term (current) drug therapy: Secondary | ICD-10-CM

## 2019-02-15 DIAGNOSIS — Z952 Presence of prosthetic heart valve: Secondary | ICD-10-CM

## 2019-02-15 DIAGNOSIS — Z7189 Other specified counseling: Secondary | ICD-10-CM

## 2019-02-15 DIAGNOSIS — N289 Disorder of kidney and ureter, unspecified: Secondary | ICD-10-CM | POA: Diagnosis not present

## 2019-02-15 DIAGNOSIS — Z794 Long term (current) use of insulin: Secondary | ICD-10-CM

## 2019-02-15 DIAGNOSIS — I480 Paroxysmal atrial fibrillation: Secondary | ICD-10-CM | POA: Diagnosis not present

## 2019-02-15 DIAGNOSIS — I5043 Acute on chronic combined systolic (congestive) and diastolic (congestive) heart failure: Secondary | ICD-10-CM

## 2019-02-15 DIAGNOSIS — Z7901 Long term (current) use of anticoagulants: Secondary | ICD-10-CM

## 2019-02-15 MED ORDER — FUROSEMIDE 80 MG PO TABS: ORAL_TABLET | ORAL | 3 refills | Status: DC

## 2019-02-15 NOTE — Patient Instructions (Signed)
Medication Instructions:  1) CHANGE Furosemide to 80mg  every morning and 40mg  every evening.  If you need a refill on your cardiac medications before your next appointment, please call your pharmacy.   Lab work: Your physician recommends that you return for lab work in: 2 weeks (BMET)  If you have labs (blood work) drawn today and your tests are completely normal, you will receive your results only by: [name] MyChart Message (if you have MyChart) OR . A paper copy in the mail If you have any lab test that is abnormal or we need to change your treatment, we will call you to review the results.  Testing/Procedures: None  Follow-Up: At [facility], you and your health needs are our priority.  As part of our continuing mission to provide you with exceptional heart care, we have created designated Provider Care Teams.  These Care Teams include your primary Cardiologist (physician) and Advanced Practice Providers (APPs -  Physician Assistants and Nurse Practitioners) who all work together to provide you with the care you need, when you need it. You will need a follow up appointment in 3-4 months.  Please call our office 2 months in advance to schedule this appointment.  You may see Sinclair Grooms, MD or one of the following Advanced Practice Providers on your designated Care Team:   Truitt Merle, NP Cecilie Kicks, NP . Kathyrn Drown, NP  Any Other Special Instructions Will Be Listed Below (If Applicable).

## 2019-02-15 NOTE — Progress Notes (Signed)
Cardiology Office Note:    Date:  02/15/2019   ID:  Janice Coffin Halley, DOB [birth year], MRN [id number]  PCP:  Ma Hillock, DO  Cardiologist:  Sinclair Grooms, MD   Referring MD: Ma Hillock, DO   Chief Complaint  Patient presents with  . Atrial Fibrillation  . Congestive Heart Failure    History of Present Illness:    Todd Romero is a 85 y.o. male with a hx of  with severe AS 03/2017 treated with TAVR,, chronic diastolic heart failure, CAD with CTO of D1 and 90% LCX (cath 2013),OSA not on therapy, HTN, type 2 diabetes, and hyperlipidemia. Recent persistent AF precipitating decompensated HF.  Because of refractory heart failure, electrical cardioversion was performed in May 2020 with subsequent improvement in heart failure symptoms.  Here today.  He is with his wife.  He felt much better after the cardioversion but subsequently has felt like fluid retention and fatigue have recurred.  On his own, he has occasionally taken an extra 40 mg of furosemide in the evenings.  He denies orthopnea.  He is completed a sleep study but does not have the results.  He still feels generally better than he did prior to cardioversion.  He denies angina.  He has had increase in weight hence the use of the extra furosemide.  On June 3, he weighed 225 pounds and today the weight is up to 211 pounds.  Past Medical History:  Diagnosis Date  . Anemia   . Arthritis   . CAD (coronary artery disease)    a. cardiac cath 12/2016 showing moderate AS, elevated LVEDP, heavy 3V coronary calcification, 100% mD2, 30-50% LAD, 50-90% stenosis of Cx proximal to origin of L-PDA, RCA not engaged due to poor catheter control (difficult procedure) - coronary status was essentially unchanged from prior.  . Chronic diastolic CHF (congestive heart failure) ([place])   . Degenerative arthritis   . Diabetes ([place])   . Dyspnea   . Essential hypertension   . Hyperlipidemia   . New onset a-fib ([place]) 03/04/2018  .  Obesity (BMI 30-39.9) 06/18/2015  . OSA (obstructive sleep apnea)    Severe with AHI 27/hr now on CPAP  not compliant with treatment.  . Peritonitis ([place]) 1985?  [name] Pneumonia    "6 months - 85 years old"  . Pure hypercholesterolemia   . RBBB   . [facility] spotted fever   . 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 Severe aortic stenosis    a. s/p TAVR 03/2017.  . Type II or unspecified type diabetes mellitus without mention of complication, not stated as uncontrolled     Past Surgical History:  Procedure Laterality Date  . APPENDECTOMY    . CARDIOVERSION N/A 04/03/2018   Procedure: CARDIOVERSION;  Surgeon: Josue Hector, MD;  Location[facility] END[facility]COPY;  Service: Cardiovascular;  Laterality: N/A;  . CARDIOVERSION N/A 01/18/2019   Procedure: CARDIOVERSION;  Surgeon: Skeet Latch, MD;  Location: [place];  Service: Cardiovascular;  Laterality: N/A;  . CARPAL TUNNEL RELEASE    . COLECTOMY     partial  . COLONOSCOPY WITH PROPOFOL N/A 11/18/2017   Procedure: COLONOSCOPY WITH PROPOFOL;  Surgeon: Carol Ada, MD;  Location: [facility] ENDOSCOPY;  Service: Endoscopy;  Laterality: N/A;  . ESOPHAGOGASTRODUODENOSCOPY N/A 11/18/2017   Procedure: ESOPHAGOGASTRODUODENOSCOPY (EGD);  Surgeon: Carol Ada, MD;  Location: [facility] ENDOSCOPY;  Service: Endoscopy;  Laterality: N/A;  . EYE SURGERY Bilateral    cataracts removed, lens placed  . JOINT REPLACEMENT    . KNEE CARTILAGE SURGERY    . PARTIAL COLECTOMY    . RIGHT/LEFT HEART CATH AND CORONARY ANGIOGRAPHY N/A 12/28/2016   Procedure: Right/Left Heart Cath and Coronary Angiography;  Surgeon: Belva Crome, MD;  Location: [place] [facility];  Service: Cardiovascular;  Laterality: N/A;  . TEE WITHOUT CARDIOVERSION N/A 03/15/2017   Procedure: TRANSESOPHAGEAL ECHOCARDIOGRAM (TEE);  Surgeon: Burnell Blanks, MD;  Location: [facility];  Service: Open Heart Surgery;  Laterality: N/A;  . TOTAL KNEE ARTHROPLASTY Right 11/08/2017  . TOTAL KNEE ARTHROPLASTY Right 11/08/2017   Procedure: TOTAL KNEE ARTHROPLASTY;  Surgeon: Melrose Nakayama, MD;  Location: [place];  Service: Orthopedics;  Laterality: Right;  . TRANSCATHETER AORTIC VALVE REPLACEMENT, TRANSFEMORAL N/A 03/15/2017   Procedure: TRANSCATHETER AORTIC VALVE REPLACEMENT, TRANSFEMORAL;  Surgeon: Burnell Blanks, MD;  Location: [place];  Service: Open Heart Surgery;  Laterality: N/A;  . VEIN SURGERY      Current Medications: Current Meds  Medication Sig  . acetaminophen (TYLENOL) 500 MG tablet Take 500 mg by mouth every 4 (four) hours as needed for moderate pain or headache.  [name] amiodarone (PACERONE) 200 MG tablet Take 1 tablet (200 mg total) by mouth daily.  [name] apixaban (ELIQUIS) 2.5 MG TABS tablet Take 1 tablet (2.5 mg total) by mouth 2 (two) times daily.  . ferrous sulfate 325 (65 FE) MG tablet Take 325 mg by mouth 2 (two) times daily with a meal.   . fish oil-omega-3 fatty acids 1000 MG capsule Take 1 g by mouth daily.   . furosemide (LASIX) 80 MG tablet Take 1 tablet by mouth daily in the morning.  Take a half tablet by mouth daily in the evening.  [name] glimepiride (AMARYL) 2 MG tablet Take one tablet twice daily  . glucose blood test strip USE AS INSTRUCTED TO CHECK BLOOD SUGAR TWICE A DAY DX:E11.65  . insulin lispro (HUMALOG KWIKPEN) 100 UNIT/ML KwikPen Inject 10-12 Units into the skin See admin instructions. Inject 10-12 units under the skin before or directly after the main meal of the day.   . insulin NPH Human (HUMULIN N) 100 UNIT/ML injection Inject 26 units of Humulin N under the skin once daily at bedtime. (Patient taking differently: Inject 22 units of Humulin N under the skin once daily at bedtime.)  . Insulin Pen Needle (B-D UF III MINI PEN NEEDLES) 31G X 5 MM MISC USE 2 PEN NEEDLE PER DAY WITH HUMALOG AND VICTOZA  . liraglutide (VICTOZA) 18 MG/3ML SOPN INJECT  1.8 MG UNDER THE SKIN AT BEDTIME  . metolazone (ZAROXOLYN) 5 MG tablet Take 1 tablet (5 mg total) by mouth once a week. On Monday  . Multiple Vitamin (MULTIVITAMIN WITH MINERALS) TABS tablet Take 1 tablet by mouth daily. Mens One a day Vit  . Multiple Vitamins-Minerals (PRESERVISION AREDS 2 PO) Take 1 capsule by mouth 2 (two) times daily.  [name] olmesartan (BENICAR) 20 MG tablet Take 1 tablet (20 mg total) by mouth daily.  . pantoprazole (PROTONIX) 40 MG tablet Take 1 tablet (40 mg total) by mouth 2 (two) times daily.  . pravastatin (PRAVACHOL) 80 MG tablet Take 1 tablet (80 mg total) by mouth every evening.  . tamsulosin (FLOMAX) 0.4 MG CAPS capsule Take 1 capsule daily  . vitamin C (ASCORBIC ACID) 500 MG tablet Take 500 mg by mouth daily.  . [DISCONTINUED] furosemide (  LASIX) 80 MG tablet Take 1 tablet (80 mg total) by mouth daily.     Allergies:   Bactrim [sulfamethoxazole-trimethoprim] and Morphine and related   Social History   Socioeconomic History  . Marital status: Married    Spouse name: Not on file  . Number of children: 4  . Years of education: Not on file  . Highest education level: Not on file  Occupational History  . Occupation: Construction-Retired  Social Needs  . Financial resource strain: Not on file  . Food insecurity    Worry: Not on file    Inability: Not on file  . Transportation needs    Medical: Not on file    Non-medical: Not on file  Tobacco Use  . Smoking status: Never Smoker  . Smokeless tobacco: Never Used  Substance and Sexual Activity  . Alcohol use: No  . Drug use: No  . Sexual activity: Not on file  Lifestyle  . Physical activity    Days per week: Not on file    Minutes per session: Not on file  . Stress: Not on file  Relationships  . Social Herbalist on phone: Not on file    Gets together: Not on file    Attends religious service: Not on file    Active member of club or organization: Not on file    Attends meetings of clubs or  organizations: Not on file    Relationship status: Not on file  Other Topics Concern  . Not on file  Social History Narrative  . Not on file     Family History: The patient's family history includes Cancer in his sister; Emphysema in his father; Healthy in his sister; Heart failure in his father; Hypertension in his mother.  ROS:   Please see the history of present illness.    Fatigue and out of energy.  All other systems reviewed and are negative.  EKGs/Labs/Other Studies Reviewed:    The following studies were reviewed today: No new recent imaging data.  Creatinine 1.73, June 9 Potassium is 4.2, June 9  EKG:  EKG reviewed EKG from 01/22/2019 demonstrates sinus bradycardia, first-degree AV block, and PVCs.  Recent Labs: 03/04/2018: B Natriuretic Peptide 261.9 03/27/2018: Magnesium 2.2; TSH 0.862 12/27/2018: Hemoglobin 10.9; Platelets 179 01/03/2019: NT-Pro BNP 2,975 02/13/2019: ALT 16; BUN 46; Creatinine, Ser 1.73; Potassium 4.2; Sodium 136  Recent Lipid Panel    Component Value Date/Time   CHOL 112 02/13/2019 1005   TRIG 84.0 02/13/2019 1005   HDL 48.10 02/13/2019 1005   CHOLHDL 2 02/13/2019 1005   VLDL 16.8 02/13/2019 1005   LDLCALC 48 02/13/2019 1005   LDLDIRECT 101.0 04/26/2016 0915    Physical Exam:    VS:  BP 122/62   Pulse (!) 32   Ht 5\' 8"  (1.727 m)   Wt 234 lb 9.6 oz (106.4 kg)   SpO2 95%   BMI 35.67 kg/m     Wt Readings from Last 3 Encounters:  02/15/19 234 lb 9.6 oz (106.4 kg)  02/15/19 233 lb 6.4 oz (105.9 kg)  02/07/19 225 lb (102.1 kg)     GEN: Bees, good skin color, lower extremity edema.. No acute distress HEENT: Normal NECK: No JVD. LYMPHATICS: No lymphadenopathy CARDIAC: IRR.  2/6 systolic murmur, no gallop, 2+ bilateral lower extremity edema VASCULAR: 2+ with irregular rhythm consistent with bigeminy.  Unable to feel pedal pulses, no bruits RESPIRATORY:  Clear to auscultation without rales, wheezing or rhonchi  ABDOMEN:  Soft, non-tender,  non-distended, No pulsatile mass, MUSCULOSKELETAL: No deformity  SKIN: Warm and dry NEUROLOGIC:  Alert and oriented x 3 PSYCHIATRIC:  Normal affect   ASSESSMENT:    1. PAF (paroxysmal atrial fibrillation) ([place])   2. Acute on chronic combined systolic and diastolic CHF (congestive heart failure) ([place])   3. S/P TAVR (transcatheter aortic valve replacement)   4. Obstructive sleep apnea   5. Essential hypertension, benign   6. Chronic anticoagulation   7. On amiodarone therapy   8. Educated About Covid-19 Virus Infection    PLAN:    In order of problems listed above:  1. EKG today documents sinus rhythm, first-degree AV block, and ventricular bigeminy.  This is very similar to the tracing performed recently at the A. fib clinic. 2. Now has recurrent fluid retention, approximately 8 pounds since 1 week ago.  Will increase furosemide to 80 mg each a.m. and 40 mg each p.m. as a continuous regimen.  Basic metabolic panel in 2 weeks. 3. Not addressed 4. Awaiting sleep study.  If he has OSA, hopefully management will improve his overall status. 5. Target 130/80 mmHg or less. 6. Continue anticoagulation therapy. 7. Will monitor therapy.  76-month clinical follow-up.  TSH and hepatic panel will be done at that time.   Medication Adjustments/Labs and Tests Ordered: Current medicines are reviewed at length with the patient today.  Concerns regarding medicines are outlined above.  Orders Placed This Encounter  Procedures  . Basic Metabolic Panel (BMET)   Meds ordered this encounter  Medications  . furosemide (LASIX) 80 MG tablet    Sig: Take 1 tablet by mouth daily in the morning.  Take a half tablet by mouth daily in the evening.    Dispense:  135 tablet    Refill:  3    Dose change    Patient Instructions  Medication Instructions:  1) CHANGE Furosemide to 80mg  every morning and 40mg  every evening.  If you need a refill on your cardiac medications before your next appointment,  please call your pharmacy.   Lab work: Your physician recommends that you return for lab work in: 2 weeks (BMET)  If you have labs (blood work) drawn today and your tests are completely normal, you will receive your results only by: [name] MyChart Message (if you have MyChart) OR . A paper copy in the mail If you have any lab test that is abnormal or we need to change your treatment, we will call you to review the results.  Testing/Procedures: None  Follow-Up: At [facility], you and your health needs are our priority.  As part of our continuing mission to provide you with exceptional heart care, we have created designated Provider Care Teams.  These Care Teams include your primary Cardiologist (physician) and Advanced Practice Providers (APPs -  Physician Assistants and Nurse Practitioners) who all work together to provide you with the care you need, when you need it. You will need a follow up appointment in 3-4 months.  Please call our office 2 months in advance to schedule this appointment.  You may see Sinclair Grooms, MD or one of the following Advanced Practice Providers on your designated Care Team:   Truitt Merle, NP Cecilie Kicks, NP . Kathyrn Drown, NP  Any Other Special Instructions Will Be Listed Below (If Applicable).       Signed, Sinclair Grooms, MD  02/15/2019 4:17 PM    [facility]

## 2019-02-15 NOTE — Patient Instructions (Addendum)
Cut out Metformin  Take 12-14 Humalog at supper  For sandwich at lunch take 6 Humalog  22 N at bedtime  Call Dr Raoul Pitch  Pm sugar at 7 pm not 10 pm

## 2019-02-15 NOTE — Progress Notes (Signed)
Patient ID: Todd Romero, male   DOB: 1934/02/25, 85 y.o.   MRN: [id number]    Reason for Appointment:   Follow-up    History of Present Illness   Diagnosis: Type 2 DIABETES MELITUS, date of diagnosis:  1997   He has been on various regimens for his diabetes and has been on bedtime insulin with NPH since 2005 He also has benefited from adding Victoza in 2011 with better postprandial control and some weight loss Previously his weight has been as much as 247 pounds  On Victoza  since 11/14 with further improvement in blood sugar control. Since about 2015 his blood sugars have been overall mildly high with A1c over 7% consistently.  RECENT HISTORY:  His A1c is relatively higher at 8.2  Has been as low as 7.1  Non-insulin hypoglycemic drugs: Amaryl 2 mg twice daily. and metformin 500 mg twice a day, Victoza 1.8 mg daily        Side effects from medications: None  Insulin regimen: NPH 24 units at bedtime and Humalog 10 12 units at suppertime    Current management, blood sugar patterns and problems identified:  Again checking blood sugars only on waking up and around bedtime and not after meals recently  His A1c is higher than expected for his home blood sugar average of 152  This indicates slightly high postprandial readings possibly after all his meals  He does not adjust his insulin very much based on what he is eating in the evening  He again thinks that some of his blood sugar readings are unpredictable  Did have markedly increased blood sugars for about 2 days after his steroid injection in the knee about 2 weeks ago  Not able to do much activity because of various problems and not being able to go to the gym  Sometimes will have more carbohydrate at meals including a sandwich at lunch which may be causing high reading  He thinks his blood sugars are excellent when he is having baked fish with broccoli and some rice  He was told to take 22 units of NPH  at bedtime and is only reducing it by 2 units  This is still causing low normal blood sugars in the mornings including 2 readings in the 60s  Monitors blood glucose:about 2x a day.    Glucometer:  Accu-Chek         Blood Glucose readings from meter download:    PRE-MEAL Fasting Lunch Dinner Bedtime Overall  Glucose range:  66-366    93-300   Mean/median: 112    190  152   POST-MEAL PC Breakfast PC Lunch PC Dinner  Glucose range:  ? ?  Mean/median:      Previous readings:  PRE-MEAL Fasting Lunch Dinner Bedtime Overall  Glucose range:  64-150    92-264   Mean/median:  112    177  143   POST-MEAL PC Breakfast PC Lunch PC Dinner  Glucose range:     Mean/median:           Physical activity: exercise: None   Meal times: Dinner about 5-7 PM  Wt Readings from Last 3 Encounters:  02/15/19 233 lb 6.4 oz (105.9 kg)  02/07/19 225 lb (102.1 kg)  01/22/19 242 lb (109.8 kg)   LABS: Lab Results  Component Value Date   HGBA1C 8.2 (H) 02/13/2019   HGBA1C 7.7 (H) 11/13/2018   HGBA1C 7.1 (H) 07/25/2018   Lab Results  Component Value Date  MICROALBUR 0.7 02/13/2019   LDLCALC 48 02/13/2019   CREATININE 1.73 (H) 02/13/2019       OTHER active problems are discussed in review of systems    Allergies as of 02/15/2019      Reactions   Bactrim [sulfamethoxazole-trimethoprim] Nausea And Vomiting, Other (See Comments)   Bleeding and ulcers   Morphine And Related Nausea And Vomiting      Medication List       Accurate as of February 15, 2019  1:57 PM. If you have any questions, ask your nurse or doctor.        acetaminophen 500 MG tablet Commonly known as: TYLENOL Take 500 mg by mouth every 4 (four) hours as needed for moderate pain or headache.   amiodarone 200 MG tablet Commonly known as: PACERONE Take 1 tablet (200 mg total) by mouth daily.   apixaban 2.5 MG Tabs tablet Commonly known as: Eliquis Take 1 tablet (2.5 mg total) by mouth 2 (two) times daily.   ferrous  sulfate 325 (65 FE) MG tablet Take 325 mg by mouth 2 (two) times daily with a meal.   fish oil-omega-3 fatty acids 1000 MG capsule Take 1 g by mouth daily.   furosemide 80 MG tablet Commonly known as: LASIX Take 1 tablet (80 mg total) by mouth daily.   glimepiride 2 MG tablet Commonly known as: AMARYL Take one tablet twice daily What changed:   how much to take  how to take this  when to take this   glucose blood test strip USE AS INSTRUCTED TO CHECK BLOOD SUGAR TWICE A DAY DX:E11.65   HumaLOG KwikPen 100 UNIT/ML KwikPen Generic drug: insulin lispro Inject 10-12 Units into the skin See admin instructions. Inject 10-12 units under the skin before or directly after the main meal of the day.   insulin NPH Human 100 UNIT/ML injection Commonly known as: HumuLIN N Inject 26 units of Humulin N under the skin once daily at bedtime. What changed:   how much to take  how to take this  when to take this  additional instructions   Insulin Pen Needle 31G X 5 MM Misc Commonly known as: B-D UF III MINI PEN NEEDLES USE 2 PEN NEEDLE PER DAY WITH HUMALOG AND VICTOZA   liraglutide 18 MG/3ML Sopn Commonly known as: Victoza INJECT 1.8 MG UNDER THE SKIN AT BEDTIME What changed:   how much to take  how to take this  when to take this   metFORMIN 500 MG (MOD) 24 hr tablet Commonly known as: GLUMETZA Take 1 tablet (500 mg total) by mouth 2 (two) times daily with a meal. (morning & noon)   metolazone 5 MG tablet Commonly known as: ZAROXOLYN Take 1 tablet (5 mg total) by mouth once a week. On Monday   multivitamin with minerals Tabs tablet Take 1 tablet by mouth daily. Mens One a day Vit   olmesartan 20 MG tablet Commonly known as: BENICAR Take 1 tablet (20 mg total) by mouth daily.   pantoprazole 40 MG tablet Commonly known as: PROTONIX Take 1 tablet (40 mg total) by mouth 2 (two) times daily.   pravastatin 80 MG tablet Commonly known as: PRAVACHOL Take 1 tablet (80  mg total) by mouth every evening.   PRESERVISION AREDS 2 PO Take 1 capsule by mouth 2 (two) times daily.   tamsulosin 0.4 MG Caps capsule Commonly known as: FLOMAX Take 1 capsule daily What changed:   how much to take  how to take  this  when to take this   vitamin C 500 MG tablet Commonly known as: ASCORBIC ACID Take 500 mg by mouth daily.       Allergies:  Allergies  Allergen Reactions  . Bactrim [Sulfamethoxazole-Trimethoprim] Nausea And Vomiting and Other (See Comments)    Bleeding and ulcers  . Morphine And Related Nausea And Vomiting    Past Medical History:  Diagnosis Date  . Anemia   . Arthritis   . CAD (coronary artery disease)    a. cardiac cath 12/2016 showing moderate AS, elevated LVEDP, heavy 3V coronary calcification, 100% mD2, 30-50% LAD, 50-90% stenosis of Cx proximal to origin of L-PDA, RCA not engaged due to poor catheter control (difficult procedure) - coronary status was essentially unchanged from prior.  . Chronic diastolic CHF (congestive heart failure) ([place])   . Degenerative arthritis   . Diabetes ([place])   . Dyspnea   . Essential hypertension   . Hyperlipidemia   . New onset a-fib ([place]) 03/04/2018  . Obesity (BMI 30-39.9) 06/18/2015  . OSA (obstructive sleep apnea)    Severe with AHI 27/hr now on CPAP  not compliant with treatment.  . Peritonitis ([place]) 1985?  [name] Pneumonia    "6 months - 85 years old"  . Pure hypercholesterolemia   . RBBB   . [facility] spotted fever   . 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 Severe aortic stenosis    a. s/p TAVR 03/2017.  . Type II or unspecified type diabetes mellitus without mention of complication, not stated as uncontrolled     Past Surgical History:  Procedure Laterality Date  . APPENDECTOMY    . CARDIOVERSION N/A 04/03/2018   Procedure: CARDIOVERSION;  Surgeon: Josue Hector, MD;  Location: [facility] ENDOSCOPY;  Service: Cardiovascular;  Laterality: N/A;  . CARDIOVERSION N/A 01/18/2019   Procedure: CARDIOVERSION;  Surgeon: Skeet Latch, MD;  Location: [place];  Service: Cardiovascular;  Laterality: N/A;  . CARPAL TUNNEL RELEASE    . COLECTOMY     partial  . COLONOSCOPY WITH PROPOFOL N/A 11/18/2017   Procedure: COLONOSCOPY WITH PROPOFOL;  Surgeon: Carol Ada, MD;  Location: [facility] ENDOSCOPY;  Service: Endoscopy;  Laterality: N/A;  . ESOPHAGOGASTRODUODENOSCOPY N/A 11/18/2017   Procedure: ESOPHAGOGASTRODUODENOSCOPY (EGD);  Surgeon: Carol Ada, MD;  Location: [facility] ENDOSCOPY;  Service: Endoscopy;  Laterality: N/A;  . EYE SURGERY Bilateral    cataracts removed, lens placed  . JOINT REPLACEMENT    . KNEE CARTILAGE SURGERY    . PARTIAL COLECTOMY    . RIGHT/LEFT HEART CATH AND CORONARY ANGIOGRAPHY N/A 12/28/2016   Procedure: Right/Left Heart Cath and Coronary Angiography;  Surgeon: Belva Crome, MD;  Location: [place] CV LAB;  Service: Cardiovascular;  Laterality: N/A;  . TEE WITHOUT CARDIOVERSION N/A 03/15/2017   Procedure: TRANSESOPHAGEAL ECHOCARDIOGRAM (TEE);  Surgeon: Burnell Blanks, MD;  Location: [place];  Service: Open Heart Surgery;  Laterality: N/A;  . TOTAL KNEE ARTHROPLASTY Right 11/08/2017  . TOTAL KNEE ARTHROPLASTY Right 11/08/2017   Procedure: TOTAL KNEE ARTHROPLASTY;  Surgeon: [place] Nakayama, MD;  Location: [place];  Service: Orthopedics;  Laterality: Right;  . TRANSCATHETER AORTIC VALVE REPLACEMENT, TRANSFEMORAL N/A 03/15/2017   Procedure: TRANSCATHETER AORTIC VALVE REPLACEMENT, TRANSFEMORAL;  Surgeon: Burnell Blanks, MD;  Location: [place];  Service: Open Heart Surgery;  Laterality: N/A;  . VEIN SURGERY      Family History  Problem Relation Age of Onset  . Heart failure Father   . Emphysema Father   . Hypertension Mother   . Cancer Sister   . Healthy Sister     Social History:  reports that he has never smoked. He has  never used smokeless tobacco. He reports that he does not drink alcohol or use drugs.  Review of Systems:     HYPERTENSION:   Has been being treated with Benicar 20 mg Followed by cardiologist    BP Readings from Last 3 Encounters:  02/15/19 (!) 124/50  01/22/19 (!) 154/72  01/18/19 128/61    Lab Results  Component Value Date   CREATININE 1.73 (H) 02/13/2019   CREATININE 2.24 (H) 01/22/2019   CREATININE 2.05 (H) 01/11/2019    CARDIAC history:   No shortness of breath on exertion since his aortic valve replacement  EDEMA: He is on 80 mg twice daily of the Lasix dose  Recently told by cardiologist to add Zaroxolyn once a week also He does have associated LV dysfunction  HYPERLIPIDEMIA: The lipid abnormality consists of elevated LDL and he is taking 80 mg Pravachol,  LDL is below 70 Triglycerides have been normal   Lab Results  Component Value Date   CHOL 112 02/13/2019   HDL 48.10 02/13/2019   LDLCALC 48 02/13/2019   LDLDIRECT 101.0 04/26/2016   TRIG 84.0 02/13/2019   CHOLHDL 2 02/13/2019     IRON deficiency anemia:  He feels fatigued easily  The hemoglobin is still low as of April despite taking iron twice a day He has not seen a PCP yet Also has renal insufficiency  No history of B-12 deficiency  Last Hemoccult was as below  CBC Latest Ref Rng & Units 12/27/2018 12/19/2018 11/13/2018  WBC 3.4 - 10.8 x10E3/uL 10.3 9.6 6.8  Hemoglobin 13.0 - 17.7 g/dL 10.9(L) 10.7(L) 11.4(L)  Hematocrit 37.5 - 51.0 % 34.0(L) 33.3(L) 34.8(L)  Platelets 150 - 450 x10E3/uL 179 197 133.0(L)   Lab Results  Component Value Date   OCCULTBLD Negative 06/26/2018   OCCULTBLD POSITIVE (A) 11/16/2017   OCCULTBLD Negative 06/07/2017      ROS    Physical Exam    BP (!) 124/50 (BP Location: Left Arm, Patient Position: Sitting, Cuff Size: Normal)   Pulse 63   Ht 5\' 8"  (1.727 m)   Wt 233 lb 6.4 oz (105.9 kg)   SpO2 96%   BMI 35.49 kg/m       ASSESSMENT/ PLAN:    Diabetes type 2 with obesity  See history of present illness for detailed discussion of his current management, blood sugar patterns and problems identified  His A1c is back up to 8.2  He is on NPH and NovoLog along with Victoza and metformin His higher blood sugars are likely to be from postprandial hyperglycemia as discussed in detail above  The need for monitoring after meals was explained and not to wait till bedtime to check his sugar and eating Also depending on his diet he needs to take coverage for lunch also especially when he is eating a sandwich He is not showing a high reading today in the office today after lunch but has only a light meal Exercising very little or none recently because of various limitations Since fasting readings are low normal he likely does not need as much NPH at bedtime  Recommendations: He will increase his NovoLog by 2 to 4 units at dinnertime as discussed before and will need to take 12 to  14 units regularly He will take 6 units at lunchtime if eating a sandwich To check more readings after breakfast and lunch and in the evening about 2 hours after eating Stop metformin because of renal insufficiency based on his total carbohydrate intake at suppertime Avoid high carbohydrate foods and limiting fried Reduce NPH by 4 units and take 22 units   HYPERTENSION: Blood pressure is now treated with Benicar and controlled  RENAL dysfunction: This is slightly worse, likely to be from multiple factors but not diabetes, has no microalbuminuria    ANEMIA: Hemoglobin needs to be evaluated by PCP Continue iron  Counseling time on subjects discussed in assessment and plan sections is over 50% of today's 25 minute visit     Patient Instructions  Cut out Metformin  Take 12-14 Humalog at supper  For sandwich at lunch take 6 Humalog  22 N at bedtime  Call Dr Raoul Pitch  Pm sugar at 7 pm not 10 pm    [name] 02/15/2019, 1:57 PM   There are no  Patient Instructions on file for this visit.

## 2019-02-16 NOTE — Procedures (Signed)
    Patient Name: Todd Romero, Todd Romero Date: 02/07/2019 Gender: Male D.O.B: [birth year] Age (years): 64 Referring Provider: Fransico Him MD, ABSM Height (inches): 69 Interpreting Physician: Fransico Him MD, ABSM Weight (lbs): 226 RPSGT: [place], Holly BMI: 33 MRN: [id number] Neck Size: 19.00  CLINICAL INFORMATION Sleep Study Type: HST  Indication for sleep study: OSA  Epworth Sleepiness Score: 6  Most recent titration study dated 03/20/2015 was optimal at 12cm H2O with an AHI of 1.6/h.  SLEEP STUDY TECHNIQUE A multi-channel overnight portable sleep study was performed. The channels recorded were: nasal airflow, thoracic respiratory movement, and oxygen saturation with a pulse oximetry. Snoring was also monitored.  MEDICATIONS Patient self administered medications include: N/A.  SLEEP ARCHITECTURE Patient was studied for 419.5 minutes. The sleep efficiency was 98.9 % and the patient was supine for 84.8%. The arousal index was 0.0 per hour.  RESPIRATORY PARAMETERS The overall AHI was 24.0 per hour, with a central apnea index of 0.0 per hour.  The oxygen nadir was 84% during sleep.  CARDIAC DATA Mean heart rate during sleep was 58.5 bpm.  IMPRESSIONS - Moderate obstructive sleep apnea occurred during this study (AHI = 24.0/h). - No significant central sleep apnea occurred during this study (CAI = 0.0/h). - Moderate oxygen desaturation was noted during this study (Min O2 = 84%). - Patient snored 4.5% during the sleep.  DIAGNOSIS - Obstructive Sleep Apnea (327.23 [G47.33 ICD-10])  RECOMMENDATIONS - Recommend CPAP titrationt in lab. - Positional therapy avoiding supine position during sleep. - Avoid alcohol, sedatives and other CNS depressants that may worsen sleep apnea and disrupt normal sleep architecture. - Sleep hygiene should be reviewed to assess factors that may improve sleep quality. - Weight management and regular exercise should be initiated or  continued.  [Electronically signed] 02/16/2019 09:36 AM  Fransico Him MD, ABSM Diplomate, American Board of Sleep Medicine

## 2019-02-19 NOTE — Addendum Note (Signed)
Addended by: Carylon Perches on: 02/19/2019 03:43 PM   Modules accepted: Orders

## 2019-02-21 ENCOUNTER — Other Ambulatory Visit: Payer: Self-pay | Admitting: Interventional Cardiology

## 2019-02-21 MED ORDER — FUROSEMIDE 80 MG PO TABS: ORAL_TABLET | ORAL | 3 refills | Status: DC

## 2019-02-21 NOTE — Telephone Encounter (Signed)
New Message      *STAT* If patient is at the pharmacy, call can be transferred to refill team.   1. Which medications need to be refilled? (please list name of each medication and dose if known) Lasix  2. Which pharmacy/location (including street and city if local pharmacy) is medication to be sent to? [facility]   3. Do they need a 30 day or 90 day supply? 67   Pts wife says the prescription was [facility] but [facility]

## 2019-02-22 ENCOUNTER — Telehealth: Payer: Self-pay | Admitting: *Deleted

## 2019-02-22 ENCOUNTER — Encounter: Payer: Self-pay | Admitting: *Deleted

## 2019-02-22 NOTE — Telephone Encounter (Signed)
This encounter was created in error - please disregard.

## 2019-02-22 NOTE — Telephone Encounter (Addendum)
Informed patient of sleep study results and patient understanding was verbalized. Patient understands his sleep study showed: Moderate obstructive sleep apnea occurred during this study (AHI = 24.0/h). DIAGNOSIS - Obstructive Sleep Apnea (327.23 [G47.33 ICD-10])  RECOMMENDATIONS - Recommend CPAP titrationt in lab. Pt is aware and agreeable to results

## 2019-02-28 ENCOUNTER — Other Ambulatory Visit: Payer: Self-pay | Admitting: Endocrinology

## 2019-03-01 ENCOUNTER — Other Ambulatory Visit: Payer: Medicare Other | Admitting: *Deleted

## 2019-03-01 ENCOUNTER — Other Ambulatory Visit: Payer: Self-pay

## 2019-03-01 DIAGNOSIS — I5043 Acute on chronic combined systolic (congestive) and diastolic (congestive) heart failure: Secondary | ICD-10-CM

## 2019-03-01 LAB — BASIC METABOLIC PANEL
BUN/Creatinine Ratio: 30 — ABNORMAL HIGH (ref 10–24)
BUN: 55 mg/dL — ABNORMAL HIGH (ref 8–27)
CO2: 20 mmol/L (ref 20–29)
Calcium: 8.7 mg/dL (ref 8.6–10.2)
Chloride: 100 mmol/L (ref 96–106)
Creatinine, Ser: 1.83 mg/dL — ABNORMAL HIGH (ref 0.76–1.27)
GFR calc Af Amer: 39 mL/min/{1.73_m2} — ABNORMAL LOW (ref >59)
GFR calc non Af Amer: 34 mL/min/{1.73_m2} — ABNORMAL LOW (ref >59)
Glucose: 146 mg/dL — ABNORMAL HIGH (ref 65–99)
Potassium: 4.7 mmol/L (ref 3.5–5.2)
Sodium: 135 mmol/L (ref 134–144)

## 2019-03-01 NOTE — Telephone Encounter (Signed)
There is no documentation of this.  Can you find any documentation that he called in in the past week regarding an alternative to CPAP?  He has not even tried a CPAP titration

## 2019-03-13 ENCOUNTER — Other Ambulatory Visit: Payer: Self-pay | Admitting: Endocrinology

## 2019-03-16 NOTE — Telephone Encounter (Signed)
Tried calling patient to speak to him concerning his titration but I have not been able to reach him.

## 2019-03-27 ENCOUNTER — Other Ambulatory Visit: Payer: Self-pay

## 2019-03-27 ENCOUNTER — Encounter (HOSPITAL_COMMUNITY): Payer: Self-pay | Admitting: Physician Assistant

## 2019-03-27 ENCOUNTER — Ambulatory Visit (HOSPITAL_COMMUNITY)
Admission: RE | Admit: 2019-03-27 | Discharge: 2019-03-27 | Disposition: A | Discharge status: Home / Self Care | Payer: Medicare Other | Source: Ambulatory Visit | Attending: Physician Assistant | Admitting: Physician Assistant

## 2019-03-27 VITALS — BP 142/56 | HR 65 | Ht 68.0 in | Wt 234.0 lb

## 2019-03-27 DIAGNOSIS — D649 Anemia, unspecified: Secondary | ICD-10-CM | POA: Insufficient documentation

## 2019-03-27 DIAGNOSIS — Z952 Presence of prosthetic heart valve: Secondary | ICD-10-CM | POA: Diagnosis not present

## 2019-03-27 DIAGNOSIS — Z6835 Body mass index (BMI) 35.0-35.9, adult: Secondary | ICD-10-CM | POA: Insufficient documentation

## 2019-03-27 DIAGNOSIS — E782 Mixed hyperlipidemia: Secondary | ICD-10-CM | POA: Insufficient documentation

## 2019-03-27 DIAGNOSIS — Z96651 Presence of right artificial knee joint: Secondary | ICD-10-CM | POA: Insufficient documentation

## 2019-03-27 DIAGNOSIS — Z8249 Family history of ischemic heart disease and other diseases of the circulatory system: Secondary | ICD-10-CM | POA: Insufficient documentation

## 2019-03-27 DIAGNOSIS — Z886 Allergy status to analgesic agent status: Secondary | ICD-10-CM | POA: Diagnosis not present

## 2019-03-27 DIAGNOSIS — Z9049 Acquired absence of other specified parts of digestive tract: Secondary | ICD-10-CM | POA: Diagnosis not present

## 2019-03-27 DIAGNOSIS — M1990 Unspecified osteoarthritis, unspecified site: Secondary | ICD-10-CM | POA: Diagnosis not present

## 2019-03-27 DIAGNOSIS — E669 Obesity, unspecified: Secondary | ICD-10-CM | POA: Insufficient documentation

## 2019-03-27 DIAGNOSIS — I2510 Atherosclerotic heart disease of native coronary artery without angina pectoris: Secondary | ICD-10-CM | POA: Diagnosis not present

## 2019-03-27 DIAGNOSIS — E119 Type 2 diabetes mellitus without complications: Secondary | ICD-10-CM | POA: Diagnosis not present

## 2019-03-27 DIAGNOSIS — I4510 Unspecified right bundle-branch block: Secondary | ICD-10-CM | POA: Diagnosis not present

## 2019-03-27 DIAGNOSIS — G4733 Obstructive sleep apnea (adult) (pediatric): Secondary | ICD-10-CM | POA: Diagnosis not present

## 2019-03-27 DIAGNOSIS — Z882 Allergy status to sulfonamides status: Secondary | ICD-10-CM | POA: Diagnosis not present

## 2019-03-27 DIAGNOSIS — Z7901 Long term (current) use of anticoagulants: Secondary | ICD-10-CM | POA: Diagnosis not present

## 2019-03-27 DIAGNOSIS — I4819 Other persistent atrial fibrillation: Secondary | ICD-10-CM | POA: Diagnosis present

## 2019-03-27 DIAGNOSIS — I5042 Chronic combined systolic (congestive) and diastolic (congestive) heart failure: Secondary | ICD-10-CM | POA: Diagnosis not present

## 2019-03-27 DIAGNOSIS — I350 Nonrheumatic aortic (valve) stenosis: Secondary | ICD-10-CM | POA: Diagnosis not present

## 2019-03-27 DIAGNOSIS — Q211 Atrial septal defect: Secondary | ICD-10-CM | POA: Diagnosis not present

## 2019-03-27 DIAGNOSIS — Z809 Family history of malignant neoplasm, unspecified: Secondary | ICD-10-CM | POA: Diagnosis not present

## 2019-03-27 DIAGNOSIS — I110 Hypertensive heart disease with heart failure: Secondary | ICD-10-CM | POA: Diagnosis not present

## 2019-03-27 DIAGNOSIS — Z794 Long term (current) use of insulin: Secondary | ICD-10-CM | POA: Insufficient documentation

## 2019-03-27 MED ORDER — AMIODARONE HCL 200 MG PO TABS: 100.0000 mg | ORAL_TABLET | Freq: Every day | ORAL | 3 refills | Status: DC

## 2019-03-27 NOTE — Progress Notes (Signed)
Primary Care Physician: Ma Hillock, DO Primary Cardiologist: Dr Tamala Julian Primary Electrophysiologist: Dr Rayann Heman Referring Physician: Dr Rosilyn Mings D Todd Romero is a 85 y.o. male with a history of persistent atrial fibrillation who presents for follow up in the [facility]. Patient now s/p DCCV 01/18/19. He reports that he has done well since his last visit. He has not had any heart racing or palpitations. His weight has remained stable. He denies any bleeding issues with anticoagulation.   Today, he denies symptoms of palpitations, chest pain, dizziness, presyncope, syncope, snoring, daytime somnolence, bleeding, or neurologic sequela. The patient is tolerating medications without difficulties and is otherwise without complaint today.    Atrial Fibrillation Risk Factors:  he does have symptoms or diagnosis of sleep apnea. he is not compliant with CPAP therapy.  he has a BMI of Body mass index is 35.58 kg/m.[name] Filed Weights   03/27/19 1457  Weight: 106.1 kg    Family History  Problem Relation Age of Onset  . Heart failure Father   . Emphysema Father   . Hypertension Mother   . Cancer Sister   . Healthy Sister      Atrial Fibrillation Management history:  Previous antiarrhythmic drugs: amiodarone Previous cardioversions: 03/2018, 01/18/19 Previous ablations: none CHADS2VASC score: 6 Anticoagulation history: Eliquis   Past Medical History:  Diagnosis Date  . Anemia   . Arthritis   . CAD (coronary artery disease)    a. cardiac cath 12/2016 showing moderate AS, elevated LVEDP, heavy 3V coronary calcification, 100% mD2, 30-50% LAD, 50-90% stenosis of Cx proximal to origin of L-PDA, RCA not engaged due to poor catheter control (difficult procedure) - coronary status was essentially unchanged from prior.  . Chronic diastolic CHF (congestive heart failure) ([place])   . Degenerative arthritis   . Diabetes ([place])   . Dyspnea   . Essential  hypertension   . Hyperlipidemia   . New onset a-fib ([place]) 03/04/2018  . Obesity (BMI 30-39.9) 06/18/2015  . OSA (obstructive sleep apnea)    Severe with AHI 27/hr now on CPAP  not compliant with treatment.  . Peritonitis ([place]) 1985?  [name] Pneumonia    "6 months - 85 years old"  . Pure hypercholesterolemia   . RBBB   . [facility] spotted fever   . 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 Severe aortic stenosis    a. s/p TAVR 03/2017.  . Type II or unspecified type diabetes mellitus without mention of complication, not stated as uncontrolled    Past Surgical History:  Procedure Laterality Date  . APPENDECTOMY    . CARDIOVERSION N/A 04/03/2018   Procedure: CARDIOVERSION;  Surgeon: Josue Hector, MD;  Location: [facility] ENDOSCOPY;  Service: Cardiovascular;  Laterality: N/A;  . CARDIOVERSION N/A 01/18/2019   Procedure: CARDIOVERSION;  Surgeon: Skeet Latch, MD;  Location: [place];  Service: Cardiovascular;  Laterality: N/A;  . CARPAL TUNNEL RELEASE    . COLECTOMY     partial  . COLONOSCOPY WITH PROPOFOL N/A 11/18/2017   Procedure: COLONOSCOPY WITH PROPOFOL;  Surgeon: Carol Ada, MD;  Location: [facility] ENDOSCOPY;  Service: Endoscopy;  Laterality: N/A;  . ESOPHAGOGASTRODUODENOSCOPY N/A 11/18/2017   Procedure: ESOPHAGOGASTRODUODENOSCOPY (EGD);  Surgeon: Carol Ada, MD;  Location: [facility];  Service: Endoscopy;  Laterality: N/A;  . EYE SURGERY Bilateral    cataracts removed, lens placed  .  JOINT REPLACEMENT    . KNEE CARTILAGE SURGERY    . PARTIAL COLECTOMY    . RIGHT/LEFT HEART CATH AND CORONARY ANGIOGRAPHY N/A 12/28/2016   Procedure: Right/Left Heart Cath and Coronary Angiography;  Surgeon: Belva Crome, MD;  Location: [place] CV LAB;  Service: Cardiovascular;  Laterality: N/A;  . TEE WITHOUT CARDIOVERSION N/A 03/15/2017   Procedure: TRANSESOPHAGEAL  ECHOCARDIOGRAM (TEE);  Surgeon: Burnell Blanks, MD;  Location: [place];  Service: Open Heart Surgery;  Laterality: N/A;  . TOTAL KNEE ARTHROPLASTY Right 11/08/2017  . TOTAL KNEE ARTHROPLASTY Right 11/08/2017   Procedure: TOTAL KNEE ARTHROPLASTY;  Surgeon: Melrose Nakayama, MD;  Location: [place];  Service: Orthopedics;  Laterality: Right;  . TRANSCATHETER AORTIC VALVE REPLACEMENT, TRANSFEMORAL N/A 03/15/2017   Procedure: TRANSCATHETER AORTIC VALVE REPLACEMENT, TRANSFEMORAL;  Surgeon: Burnell Blanks, MD;  Location: [place];  Service: Open Heart Surgery;  Laterality: N/A;  . VEIN SURGERY      Current Outpatient Medications  Medication Sig Dispense Refill  . ACCU-CHEK AVIVA PLUS test strip USE AS INSTRUCTED TO CHECK BLOOD SUGAR TWICE A DAY DX:E11.65 100 strip 2  . acetaminophen (TYLENOL) 500 MG tablet Take 500 mg by mouth every 4 (four) hours as needed for moderate pain or headache.    [name] amiodarone (PACERONE) 200 MG tablet Take 0.5 tablets (100 mg total) by mouth daily. 90 tablet 3  . apixaban (ELIQUIS) 2.5 MG TABS tablet Take 1 tablet (2.5 mg total) by mouth 2 (two) times daily. 60 tablet 11  . BD INSULIN SYRINGE U/F 31G X 5/16" 1 ML MISC USE TO INJECT INSULIN DAILY 100 each 3  . ferrous sulfate 325 (65 FE) MG tablet Take 325 mg by mouth 2 (two) times daily with a meal.     . fish oil-omega-3 fatty acids 1000 MG capsule Take 1 g by mouth daily.     . furosemide (LASIX) 80 MG tablet Take 1 tablet by mouth daily in the morning.  Take a half tablet by mouth daily in the evening. 135 tablet 3  . glimepiride (AMARYL) 2 MG tablet Take one tablet twice daily 180 tablet 4  . HUMALOG KWIKPEN 100 UNIT/ML KwikPen INJECT 4-8 UNITS BEFORE THE MAIN MEALS OF THE DAY 15 mL 3  . insulin NPH Human (HUMULIN N) 100 UNIT/ML injection Inject 26 units of Humulin N under the skin once daily at bedtime. (Patient taking differently: Inject 22 units of Humulin N under the skin once daily at bedtime.) 30 mL 1  .  Insulin Pen Needle (B-D UF III MINI PEN NEEDLES) 31G X 5 MM MISC USE 2 PEN NEEDLE PER DAY WITH HUMALOG AND VICTOZA 200 each 3  . liraglutide (VICTOZA) 18 MG/3ML SOPN INJECT 1.8 MG UNDER THE SKIN AT BEDTIME 9 pen 2  . metolazone (ZAROXOLYN) 5 MG tablet Take 1 tablet (5 mg total) by mouth once a week. On Monday 30 tablet 1  . Multiple Vitamin (MULTIVITAMIN WITH MINERALS) TABS tablet Take 1 tablet by mouth daily. Mens One a day Vit    . Multiple Vitamins-Minerals (PRESERVISION AREDS 2 PO) Take 1 capsule by mouth 2 (two) times daily.    [name] olmesartan (BENICAR) 20 MG tablet Take 1 tablet (20 mg total) by mouth daily. 90 tablet 3  . pantoprazole (PROTONIX) 40 MG tablet Take 1 tablet (40 mg total) by mouth 2 (two) times daily. 180 tablet 3  . pravastatin (PRAVACHOL) 80 MG tablet Take 1 tablet (80 mg total) by mouth every evening.  90 tablet 1  . tamsulosin (FLOMAX) 0.4 MG CAPS capsule Take 1 capsule daily 90 capsule 1  . vitamin C (ASCORBIC ACID) 500 MG tablet Take 500 mg by mouth daily.     No current facility-administered medications for this encounter.     Allergies  Allergen Reactions  . Bactrim [Sulfamethoxazole-Trimethoprim] Nausea And Vomiting and Other (See Comments)    Bleeding and ulcers  . Morphine And Related Nausea And Vomiting    Social History   Socioeconomic History  . Marital status: Married    Spouse name: Not on file  . Number of children: 4  . Years of education: Not on file  . Highest education level: Not on file  Occupational History  . Occupation: Construction-Retired  Social Needs  . Financial resource strain: Not on file  . Food insecurity    Worry: Not on file    Inability: Not on file  . Transportation needs    Medical: Not on file    Non-medical: Not on file  Tobacco Use  . Smoking status: Never Smoker  . Smokeless tobacco: Never Used  Substance and Sexual Activity  . Alcohol use: No  . Drug use: No  . Sexual activity: Not on file  Lifestyle  .  Physical activity    Days per week: Not on file    Minutes per session: Not on file  . Stress: Not on file  Relationships  . Social Herbalist on phone: Not on file    Gets together: Not on file    Attends religious service: Not on file    Active member of club or organization: Not on file    Attends meetings of clubs or organizations: Not on file    Relationship status: Not on file  . Intimate partner violence    Fear of current or ex partner: Not on file    Emotionally abused: Not on file    Physically abused: Not on file    Forced sexual activity: Not on file  Other Topics Concern  . Not on file  Social History Narrative  . Not on file     ROS- All systems are reviewed and negative except as per the HPI above.  Physical Exam: Vitals:   03/27/19 1457  BP: (!) 142/56  Pulse: 65  Weight: 106.1 kg  Height: 5\' 8"  (1.727 m)    GEN- The patient is well appearing elderly male, alert and oriented x 3 today.   HEENT-head normocephalic, atraumatic, sclera clear, conjunctiva pink, hearing intact, trachea midline. Lungs- Clear to ausculation bilaterally, normal work of breathing Heart- Regular rate and rhythm, no murmurs, rubs or gallops  GI- soft, NT, ND, + BS Extremities- no clubbing, cyanosis. Trace edema R>L MS- no significant deformity or atrophy Skin- no rash or lesion Psych- euthymic mood, full affect Neuro- strength and sensation are intact    Wt Readings from Last 3 Encounters:  03/27/19 106.1 kg  02/15/19 106.4 kg  02/15/19 105.9 kg    EKG today demonstrates SR HR 65, 1st degree AV block, RBBB, LAFB, slow R wave prog, PR 312, QRS 154, QTc 484   Echo 02/06/18 demonstrated  - Left ventricle: The cavity size was normal. Wall thickness was   increased in a pattern of moderate LVH. The estimated ejection   fraction was 43%. Diffuse hypokinesis. The study is not   technically sufficient to allow evaluation of LV diastolic   function. Ejection fraction  (MOD, 2-plane): 43%. -  Aortic valve: s/p TAVR valve. No obstruction. Trivial   paravalvular leak. Mean gradient (S): 10 mm Hg. Peak gradient   (S): 24 mm Hg. Valve area (VTI): 1.18 cm^2. Valve area (Vmax):   1.09 cm^2. Valve area (Vmean): 1.2 cm^2. - Left atrium: The atrium was mildly dilated. - Right ventricle: The cavity size was mildly dilated. - Atrial septum: Small atrial septal defect. - Tricuspid valve: There was moderate regurgitation. - Pulmonary arteries: PA peak pressure: 47 mm Hg (S). - Inferior vena cava: The vessel was dilated. The respirophasic   diameter changes were blunted (< 50%), consistent with elevated   central venous pressure.  Epic records are reviewed at length today  Assessment and Plan:  1. Persistent atrial fibrillation S/p DCCV on 01/18/19 with symptomatic improvement.  Appears to be maintaining SR. Decrease amiodarone to 100 mg daily given prolonged AV conduction. Continue Eliquis 2.5 mg BID  Lifestyle changes as below.  This patients CHA2DS2-VASc Score and unadjusted Ischemic Stroke Rate (% per year) is equal to 9.7 % stroke rate/year from a score of 6  Above score calculated as 1 point each if present [CHF, HTN, DM, Vascular=MI/PAD/Aortic Plaque, Age if 65-74, or Male] Above score calculated as 2 points each if present [Age > 75, or Stroke/TIA/TE]  2. Chronic combined systolic and diastolic CHF Weight is stable, no symptoms of fluid overload. Continue current therapy and 2 gram sodium restriction.  3. Obesity Body mass index is 35.58 kg/m. Lifestyle modification was discussed and encouraged including regular physical activity and weight reduction.  4. Obstructive sleep apnea The importance of adequate treatment of sleep apnea was discussed today in order to improve our ability to maintain sinus rhythm long term. Followed by Dr Radford Pax. Pt to undergo CPAP titration.   5. HTN Stable, no changes today.    Follow up with Dr Tamala Julian as scheduled  and Dr Rayann Heman in 6 months.   [facility] [address] [place], [place] [address] [phone] 03/27/2019 3:47 PM

## 2019-04-02 ENCOUNTER — Other Ambulatory Visit: Payer: Self-pay

## 2019-04-02 ENCOUNTER — Telehealth: Payer: Self-pay | Admitting: Endocrinology

## 2019-04-02 MED ORDER — PRAVASTATIN SODIUM 80 MG PO TABS: 80.0000 mg | ORAL_TABLET | Freq: Every evening | ORAL | 1 refills | Status: DC

## 2019-04-02 NOTE — Telephone Encounter (Signed)
Rx sent 

## 2019-04-02 NOTE — Telephone Encounter (Signed)
MEDICATION:   pravastatin (PRAVACHOL) 80 MG tablet   Folmax  PHARMACY:  [facility]  IS THIS A 90 DAY SUPPLY : Yes  IS PATIENT OUT OF MEDICATION: 1 week left  IF NOT; HOW MUCH IS LEFT:   LAST APPOINTMENT DATE: @7 /03/2019  NEXT APPOINTMENT DATE:@9 /21/2020  DO WE HAVE YOUR PERMISSION TO LEAVE A DETAILED MESSAGE:  OTHER COMMENTS:    **Let patient know to contact pharmacy at the end of the day to make sure medication is ready. **  ** Please notify patient to allow 48-72 hours to process**  **Encourage patient to contact the pharmacy for refills or they can request refills through [facility]**

## 2019-04-10 ENCOUNTER — Other Ambulatory Visit: Payer: Self-pay

## 2019-04-10 ENCOUNTER — Telehealth: Payer: Self-pay | Admitting: Endocrinology

## 2019-04-10 MED ORDER — TAMSULOSIN HCL 0.4 MG PO CAPS: ORAL_CAPSULE | ORAL | 1 refills | Status: DC

## 2019-04-10 NOTE — Telephone Encounter (Signed)
MEDICATION: tamsulosin (FLOMAX) 0.4 MG CAPS capsule  PHARMACY:   [facility] [place] - [address] [phone] (Phone) [phone] (Fax)    IS THIS A 90 DAY SUPPLY : Yes  IS PATIENT OUT OF MEDICATION:  Almost  IF NOT; HOW MUCH IS LEFT: enough for 4 days  LAST APPOINTMENT DATE: @7 /27/2020  NEXT APPOINTMENT DATE:@9 /21/2020  DO WE HAVE YOUR PERMISSION TO LEAVE A DETAILED MESSAGE: Yes  OTHER COMMENTS: Patient states he previously requested the above RX last week. Patient needs RX sent asap due to mail delivery time.   **Let patient know to contact pharmacy at the end of the day to make sure medication is ready. **  ** Please notify patient to allow 48-72 hours to process**  **Encourage patient to contact the pharmacy for refills or they can request refills through [facility]**

## 2019-04-10 NOTE — Telephone Encounter (Signed)
Rx sent 

## 2019-04-22 ENCOUNTER — Other Ambulatory Visit: Payer: Self-pay | Admitting: Interventional Cardiology

## 2019-04-25 NOTE — Telephone Encounter (Signed)
Spoke to [place] (wife) per dpr she states the patient is sleeping better and wants to wait until the fall before making a decision to do the cpap titration. They will contact out office when they are ready.

## 2019-05-15 ENCOUNTER — Telehealth: Payer: Self-pay

## 2019-05-15 NOTE — Telephone Encounter (Signed)
LVM informing pt about the change from OV to Virtual and to CB if he wants to reschedule or has any question.

## 2019-05-16 NOTE — Telephone Encounter (Signed)
Spoke with pt's wife and she is aware visit has been changed to virtual.  Went over medications and advised I will call in the morning to get vitals.

## 2019-05-16 NOTE — Progress Notes (Signed)
Virtual Visit via Video Note   This visit type was conducted due to national recommendations for restrictions regarding the COVID-19 Pandemic (e.g. social distancing) in an effort to limit this patient's exposure and mitigate transmission in our community.  Due to his co-morbid illnesses, this patient is at least at moderate risk for complications without adequate follow up.  This format is felt to be most appropriate for this patient at this time.  All issues noted in this document were discussed and addressed.  A limited physical exam was performed with this format.  Please refer to the patient's chart for his consent to telehealth for [facility].   Date:  05/17/2019   ID:  Todd Romero, DOB December 04, 1933, MRN [id number]  Patient Location: Home Provider Location: Home  PCP:  Ma Hillock, DO  Cardiologist:  Sinclair Grooms, MD  Electrophysiologist:  None   Evaluation Performed:  Follow-Up Visit  Chief Complaint:  AF and CHF  History of Present Illness:    Todd Romero is a 85 y.o. male with severe AS 03/2017 treated with TAVR,, chronic diastolic heart failure, CAD with CTO of D1 and 90% LCX (cath 2013),OSA not on therapy, HTN, type 2 diabetes, and hyperlipidemia.Recent persistent AF precipitating decompensated HF.  Because of refractory heart failure, electrical cardioversion was performed in May 2020 with subsequent improvement in heart failure symptoms.  He is doing well.  Weight is being maintained and is stable.  He denies chest pain.  No lower extremity swelling.  No orthopnea, PND, or wheezing.  He has not had angina.  He has not had syncope.  They noticed heart rates in the high 40s at night when he is resting.  Heart rate comes up in the normal range during the daytime when he is up moving around.  The patient does not have symptoms concerning for COVID-19 infection (fever, chills, cough, or new shortness of breath).    Past Medical History:  Diagnosis  Date  . Anemia   . Arthritis   . CAD (coronary artery disease)    a. cardiac cath 12/2016 showing moderate AS, elevated LVEDP, heavy 3V coronary calcification, 100% mD2, 30-50% LAD, 50-90% stenosis of Cx proximal to origin of L-PDA, RCA not engaged due to poor catheter control (difficult procedure) - coronary status was essentially unchanged from prior.  . Chronic diastolic CHF (congestive heart failure) ([place])   . Degenerative arthritis   . Diabetes ([place])   . Dyspnea   . Essential hypertension   . Hyperlipidemia   . New onset a-fib ([place]) 03/04/2018  . Obesity (BMI 30-39.9) 06/18/2015  . OSA (obstructive sleep apnea)    Severe with AHI 27/hr now on CPAP  not compliant with treatment.  . Peritonitis ([place]) 1985?  [name] Pneumonia    "6 months - 85 years old"  . Pure hypercholesterolemia   . RBBB   . [facility] spotted fever   . 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 Severe aortic stenosis    a. s/p TAVR 03/2017.  . Type II or unspecified type diabetes mellitus without mention of complication, not stated as uncontrolled    Past Surgical History:  Procedure Laterality Date  . APPENDECTOMY    . CARDIOVERSION N/A 04/03/2018   Procedure: CARDIOVERSION;  Surgeon: Josue Hector, MD;  Location: [facility] ENDOSCOPY;  Service: Cardiovascular;  Laterality: N/A;  . CARDIOVERSION  N/A 01/18/2019   Procedure: CARDIOVERSION;  Surgeon: Skeet Latch, MD;  Location: [place];  Service: Cardiovascular;  Laterality: N/A;  . CARPAL TUNNEL RELEASE    . COLECTOMY     partial  . COLONOSCOPY WITH PROPOFOL N/A 11/18/2017   Procedure: COLONOSCOPY WITH PROPOFOL;  Surgeon: Carol Ada, MD;  Location: [facility] ENDOSCOPY;  Service: Endoscopy;  Laterality: N/A;  . ESOPHAGOGASTRODUODENOSCOPY N/A 11/18/2017   Procedure: ESOPHAGOGASTRODUODENOSCOPY (EGD);  Surgeon: Carol Ada, MD;  Location: [facility]  ENDOSCOPY;  Service: Endoscopy;  Laterality: N/A;  . EYE SURGERY Bilateral    cataracts removed, lens placed  . JOINT REPLACEMENT    . KNEE CARTILAGE SURGERY    . PARTIAL COLECTOMY    . RIGHT/LEFT HEART CATH AND CORONARY ANGIOGRAPHY N/A 12/28/2016   Procedure: Right/Left Heart Cath and Coronary Angiography;  Surgeon: Belva Crome, MD;  Location: [place] CV LAB;  Service: Cardiovascular;  Laterality: N/A;  . TEE WITHOUT CARDIOVERSION N/A 03/15/2017   Procedure: TRANSESOPHAGEAL ECHOCARDIOGRAM (TEE);  Surgeon: Burnell Blanks, MD;  Location: [place];  Service: Open Heart Surgery;  Laterality: N/A;  . TOTAL KNEE ARTHROPLASTY Right 11/08/2017  . TOTAL KNEE ARTHROPLASTY Right 11/08/2017   Procedure: TOTAL KNEE ARTHROPLASTY;  Surgeon: Melrose Nakayama, MD;  Location: [place];  Service: Orthopedics;  Laterality: Right;  . TRANSCATHETER AORTIC VALVE REPLACEMENT, TRANSFEMORAL N/A 03/15/2017   Procedure: TRANSCATHETER AORTIC VALVE REPLACEMENT, TRANSFEMORAL;  Surgeon: Burnell Blanks, MD;  Location: [place];  Service: Open Heart Surgery;  Laterality: N/A;  . VEIN SURGERY       Current Meds  Medication Sig  . ACCU-CHEK AVIVA PLUS test strip USE AS INSTRUCTED TO CHECK BLOOD SUGAR TWICE A DAY DX:E11.65  . acetaminophen (TYLENOL) 500 MG tablet Take 500 mg by mouth every 4 (four) hours as needed for moderate pain or headache.  [name] amiodarone (PACERONE) 200 MG tablet Take 0.5 tablets (100 mg total) by mouth daily.  [name] apixaban (ELIQUIS) 2.5 MG TABS tablet Take 1 tablet (2.5 mg total) by mouth 2 (two) times daily.  . BD INSULIN SYRINGE U/F 31G X 5/16" 1 ML MISC USE TO INJECT INSULIN DAILY  . ferrous sulfate 325 (65 FE) MG tablet Take 325 mg by mouth 2 (two) times daily with a meal.   . fish oil-omega-3 fatty acids 1000 MG capsule Take 1 g by mouth daily.   . furosemide (LASIX) 80 MG tablet Take 1 tablet by mouth daily in the morning.  Take a half tablet by mouth daily in the evening.  [name] glimepiride  (AMARYL) 2 MG tablet Take one tablet twice daily  . HUMALOG KWIKPEN 100 UNIT/ML KwikPen INJECT 4-8 UNITS BEFORE THE MAIN MEALS OF THE DAY  . insulin NPH Human (HUMULIN N) 100 UNIT/ML injection Inject 26 units of Humulin N under the skin once daily at bedtime. (Patient taking differently: Inject 22 units of Humulin N under the skin once daily at bedtime.)  . Insulin Pen Needle (B-D UF III MINI PEN NEEDLES) 31G X 5 MM MISC USE 2 PEN NEEDLE PER DAY WITH HUMALOG AND VICTOZA  . liraglutide (VICTOZA) 18 MG/3ML SOPN INJECT 1.8 MG UNDER THE SKIN AT BEDTIME  . metolazone (ZAROXOLYN) 5 MG tablet Take 1 tablet (5 mg total) by mouth once a week. On Monday  . Multiple Vitamin (MULTIVITAMIN WITH MINERALS) TABS tablet Take 1 tablet by mouth daily. Mens One a day Vit  . Multiple Vitamins-Minerals (PRESERVISION AREDS 2 PO) Take 1 capsule by mouth 2 (two) times daily.  [name]  olmesartan (BENICAR) 20 MG tablet TAKE 1 TABLET BY MOUTH EVERY DAY  . pantoprazole (PROTONIX) 40 MG tablet Take 1 tablet (40 mg total) by mouth 2 (two) times daily.  . pravastatin (PRAVACHOL) 80 MG tablet Take 1 tablet (80 mg total) by mouth every evening.  . tamsulosin (FLOMAX) 0.4 MG CAPS capsule Take 1 capsule daily  . vitamin C (ASCORBIC ACID) 500 MG tablet Take 500 mg by mouth daily.     Allergies:   Bactrim [sulfamethoxazole-trimethoprim] and Morphine and related   Social History   Tobacco Use  . Smoking status: Never Smoker  . Smokeless tobacco: Never Used  Substance Use Topics  . Alcohol use: No  . Drug use: No     Family Hx: The patient's family history includes Cancer in his sister; Emphysema in his father; Healthy in his sister; Heart failure in his father; Hypertension in his mother.  ROS:   Please see the history of present illness.    They are concerned that he is not being seen in person and therefore do not know he is in sinus rhythm.  I reassured them that if he goes into atrial fibrillation that he will start swelling  and therefore the weight will be an indicator.  Also the resting heart rate will be above 60 whereas it is now in the upper 40s at rest.  If they noticed either of these they need to call. All other systems reviewed and are negative.   Prior CV studies:   The following studies were reviewed today:  No new imaging data  Labs/Other Tests and Data Reviewed:    EKG:  No ECG reviewed.  Recent Labs: 12/27/2018: Hemoglobin 10.9; Platelets 179 01/03/2019: NT-Pro BNP 2,975 02/13/2019: ALT 16 03/01/2019: BUN 55; Creatinine, Ser 1.83; Potassium 4.7; Sodium 135   Recent Lipid Panel Lab Results  Component Value Date/Time   CHOL 112 02/13/2019 10:05 AM   TRIG 84.0 02/13/2019 10:05 AM   HDL 48.10 02/13/2019 10:05 AM   CHOLHDL 2 02/13/2019 10:05 AM   LDLCALC 48 02/13/2019 10:05 AM   LDLDIRECT 101.0 04/26/2016 09:15 AM    Wt Readings from Last 3 Encounters:  05/17/19 222 lb (100.7 kg)  03/27/19 234 lb (106.1 kg)  02/15/19 234 lb 9.6 oz (106.4 kg)     Objective:    Vital Signs:  BP (!) 127/59   Pulse 60   Ht 5\' 8"  (1.727 m)   Wt 222 lb (100.7 kg)   BMI 33.75 kg/m    VITAL SIGNS:  reviewed  ASSESSMENT & PLAN:    1. S/P TAVR (transcatheter aortic valve replacement)   2. PAF (paroxysmal atrial fibrillation) ([place])   3. Chronic combined systolic and diastolic congestive heart failure ([place])   4. On amiodarone therapy   5. Chronic anticoagulation   6. Essential hypertension, benign   7. Obstructive sleep apnea   8. Educated About Covid-19 Virus Infection    PLAN:  1. Doing well without clinical issues with the TAVR valve. 2. Presumed maintenance of sinus rhythm/sinus bradycardia based on clinical information. 3. Weight is stable without volume overload based upon weights that are now 12 pounds below pre-cardioversion weights. 4. Continue amiodarone 100 mg/day.  Liver and TSH will need to be done in 6 months. 5. Continue apixaban and watch for bleeding. 6. Notify if blood pressures  consistently less than 379 systolic or greater than 024 systolic. 7. Encouraged compliance with CPAP. 8. Social distancing, mask wearing, and handwashing is encouraged. 9.  COVID-19 Education: The signs and symptoms of COVID-19 were discussed with the patient and how to seek care for testing (follow up with PCP or arrange E-visit).  The importance of social distancing was discussed today.  Time:   Today, I have spent 15 minutes with the patient with telehealth technology discussing the above problems.     Medication Adjustments/Labs and Tests Ordered: Current medicines are reviewed at length with the patient today.  Concerns regarding medicines are outlined above.   Tests Ordered: No orders of the defined types were placed in this encounter.   Medication Changes: No orders of the defined types were placed in this encounter.   Follow Up:  In Person in 4 month(s)  Signed, Sinclair Grooms, MD  05/17/2019 10:58 AM    [place]

## 2019-05-17 ENCOUNTER — Encounter: Payer: Self-pay | Admitting: Interventional Cardiology

## 2019-05-17 ENCOUNTER — Telehealth (INDEPENDENT_AMBULATORY_CARE_PROVIDER_SITE_OTHER): Payer: Medicare Other | Admitting: Interventional Cardiology

## 2019-05-17 ENCOUNTER — Other Ambulatory Visit: Payer: Self-pay

## 2019-05-17 ENCOUNTER — Other Ambulatory Visit: Payer: Self-pay | Admitting: Endocrinology

## 2019-05-17 VITALS — BP 127/59 | HR 60 | Ht 68.0 in | Wt 222.0 lb

## 2019-05-17 DIAGNOSIS — Z952 Presence of prosthetic heart valve: Secondary | ICD-10-CM

## 2019-05-17 DIAGNOSIS — I480 Paroxysmal atrial fibrillation: Secondary | ICD-10-CM

## 2019-05-17 DIAGNOSIS — Z7901 Long term (current) use of anticoagulants: Secondary | ICD-10-CM

## 2019-05-17 DIAGNOSIS — I10 Essential (primary) hypertension: Secondary | ICD-10-CM

## 2019-05-17 DIAGNOSIS — Z7189 Other specified counseling: Secondary | ICD-10-CM

## 2019-05-17 DIAGNOSIS — Z79899 Other long term (current) drug therapy: Secondary | ICD-10-CM

## 2019-05-17 DIAGNOSIS — G4733 Obstructive sleep apnea (adult) (pediatric): Secondary | ICD-10-CM

## 2019-05-17 DIAGNOSIS — I5042 Chronic combined systolic (congestive) and diastolic (congestive) heart failure: Secondary | ICD-10-CM

## 2019-05-17 NOTE — Patient Instructions (Signed)
Medication Instructions:  Your physician recommends that you continue on your current medications as directed. Please refer to the Current Medication list given to you today.  If you need a refill on your cardiac medications before your next appointment, please call your pharmacy.   Lab work: None If you have labs (blood work) drawn today and your tests are completely normal, you will receive your results only by: [name] MyChart Message (if you have MyChart) OR . A paper copy in the mail If you have any lab test that is abnormal or we need to change your treatment, we will call you to review the results.  Testing/Procedures: None  Follow-Up: At [facility], you and your health needs are our priority.  As part of our continuing mission to provide you with exceptional heart care, we have created designated Provider Care Teams.  These Care Teams include your primary Cardiologist (physician) and Advanced Practice Providers (APPs -  Physician Assistants and Nurse Practitioners) who all work together to provide you with the care you need, when you need it. You will need a follow up appointment in 4 months.  Please call our office 2 months in advance to schedule this appointment.  You may see Sinclair Grooms, MD or one of the following Advanced Practice Providers on your designated Care Team:   Truitt Merle, NP Cecilie Kicks, NP . Kathyrn Drown, NP  Any Other Special Instructions Will Be Listed Below (If Applicable).  Monitor your weight daily.  Call our office if you gain 3lbs overnight or 5lbs in a week.    Monitor your heart rate and let us know if resting heart rate is consistently above 80.

## 2019-05-28 ENCOUNTER — Other Ambulatory Visit (INDEPENDENT_AMBULATORY_CARE_PROVIDER_SITE_OTHER): Payer: Medicare Other

## 2019-05-28 ENCOUNTER — Other Ambulatory Visit: Payer: Self-pay

## 2019-05-28 DIAGNOSIS — E1165 Type 2 diabetes mellitus with hyperglycemia: Secondary | ICD-10-CM | POA: Diagnosis not present

## 2019-05-28 DIAGNOSIS — D508 Other iron deficiency anemias: Secondary | ICD-10-CM

## 2019-05-28 DIAGNOSIS — Z794 Long term (current) use of insulin: Secondary | ICD-10-CM | POA: Diagnosis not present

## 2019-05-28 LAB — COMPREHENSIVE METABOLIC PANEL
ALT: 14 U/L (ref 0–53)
AST: 20 U/L (ref 0–37)
Albumin: 4 g/dL (ref 3.5–5.2)
Alkaline Phosphatase: 61 U/L (ref 39–117)
BUN: 55 mg/dL — ABNORMAL HIGH (ref 6–23)
CO2: 26 mEq/L (ref 19–32)
Calcium: 9.4 mg/dL (ref 8.4–10.5)
Chloride: 103 mEq/L (ref 96–112)
Creatinine, Ser: 2.21 mg/dL — ABNORMAL HIGH (ref 0.40–1.50)
GFR: 28.57 mL/min — ABNORMAL LOW (ref >60.00)
Glucose, Bld: 141 mg/dL — ABNORMAL HIGH (ref 70–99)
Potassium: 4.3 mEq/L (ref 3.5–5.1)
Sodium: 138 mEq/L (ref 135–145)
Total Bilirubin: 0.5 mg/dL (ref 0.2–1.2)
Total Protein: 6.7 g/dL (ref 6.0–8.3)

## 2019-05-28 LAB — CBC
HCT: 33.6 % — ABNORMAL LOW (ref 39.0–52.0)
Hemoglobin: 11.1 g/dL — ABNORMAL LOW (ref 13.0–17.0)
MCHC: 33.2 g/dL (ref 30.0–36.0)
MCV: 93.1 fl (ref 78.0–100.0)
Platelets: 142 10*3/uL — ABNORMAL LOW (ref 150.0–400.0)
RBC: 3.6 Mil/uL — ABNORMAL LOW (ref 4.22–5.81)
RDW: 15.5 % (ref 11.5–15.5)
WBC: 6.7 10*3/uL (ref 4.0–10.5)

## 2019-05-28 LAB — HEMOGLOBIN A1C: Hgb A1c MFr Bld: 7.7 % — ABNORMAL HIGH (ref 4.6–6.5)

## 2019-05-31 ENCOUNTER — Ambulatory Visit: Payer: Medicare Other | Admitting: Endocrinology

## 2019-06-15 ENCOUNTER — Other Ambulatory Visit: Payer: Self-pay | Admitting: Internal Medicine

## 2019-06-18 ENCOUNTER — Ambulatory Visit: Payer: Medicare Other | Admitting: Endocrinology

## 2019-06-21 ENCOUNTER — Other Ambulatory Visit: Payer: Self-pay

## 2019-06-25 ENCOUNTER — Ambulatory Visit: Payer: Medicare Other | Admitting: Endocrinology

## 2019-07-04 ENCOUNTER — Other Ambulatory Visit: Payer: Self-pay

## 2019-07-06 ENCOUNTER — Encounter: Payer: Self-pay | Admitting: Endocrinology

## 2019-07-06 ENCOUNTER — Other Ambulatory Visit: Payer: Self-pay

## 2019-07-06 ENCOUNTER — Ambulatory Visit (INDEPENDENT_AMBULATORY_CARE_PROVIDER_SITE_OTHER): Payer: Medicare Other | Admitting: Endocrinology

## 2019-07-06 VITALS — BP 124/58 | HR 35 | Ht 68.0 in | Wt 233.6 lb

## 2019-07-06 DIAGNOSIS — D508 Other iron deficiency anemias: Secondary | ICD-10-CM

## 2019-07-06 DIAGNOSIS — N183 Chronic kidney disease, stage 3 unspecified: Secondary | ICD-10-CM

## 2019-07-06 DIAGNOSIS — R400 Somnolence: Secondary | ICD-10-CM | POA: Diagnosis not present

## 2019-07-06 DIAGNOSIS — Z23 Encounter for immunization: Secondary | ICD-10-CM | POA: Diagnosis not present

## 2019-07-06 DIAGNOSIS — E1165 Type 2 diabetes mellitus with hyperglycemia: Secondary | ICD-10-CM

## 2019-07-06 DIAGNOSIS — Z794 Long term (current) use of insulin: Secondary | ICD-10-CM

## 2019-07-06 NOTE — Progress Notes (Signed)
Patient ID: Todd Romero, male   DOB: 1933-12-31, 85 y.o.   MRN: [id number]    Reason for Appointment:   Follow-up    History of Present Illness   Diagnosis: Type 2 DIABETES MELITUS, date of diagnosis:  1997   He has been on various regimens for his diabetes and has been on bedtime insulin with NPH since 2005 He also has benefited from adding Victoza in 2011 with better postprandial control and some weight loss Previously his weight has been as much as 247 pounds  On Victoza  since 11/14 with further improvement in blood sugar control. Since about 2015 his blood sugars have been overall mildly high with A1c over 7% consistently.  RECENT HISTORY:  His A1c is back down to 7.7, previously was higher at 8.2  Has been as low as 7.1  Non-insulin hypoglycemic drugs: Amaryl 2 mg twice daily. and metformin 500 mg twice a day, Victoza 1.8 mg daily        Side effects from medications: None  Insulin regimen: NPH 24 units at bedtime and Humalog 10-12 units at suppertime    Current management, blood sugar patterns and problems identified:  He was told to reduce his bedtime dose of NPH to 22 when he is still taking 24  This is despite getting blood sugars as low as 57 recently in the morning and averaging only 85 in the morning  He will have some symptoms of low sugars if they are below 60  He does not check his sugars after breakfast or in the afternoon and only morning and bedtime  BEDTIME blood sugars are quite variable but in the last 10 days consistently high  He only adjust his suppertime Humalog by about 2 units based on what he is eating  Again he did not increase his Humalog at suppertime as directed previously  Has no side effects with metformin, Victoza  He has limited ability to exercise and not able to go to the gym also now  His weight is fluctuated, recently higher  Monitors blood glucose:about 2x a day.    Glucometer:  Accu-Chek         Blood  Glucose readings from meter download:    PRE-MEAL Fasting Lunch Dinner Bedtime Overall  Glucose range:  57-125    89-327  57-327  Mean/median: 85    185  133+/-62   Previous readings:  PRE-MEAL Fasting Lunch Dinner Bedtime Overall  Glucose range:  66-366    93-300   Mean/median: 112    190  152   POST-MEAL PC Breakfast PC Lunch PC Dinner  Glucose range:  ? ?  Mean/median:          Physical activity: exercise: None   Meal times: Dinner about 5-7 PM  Wt Readings from Last 3 Encounters:  07/06/19 233 lb 9.6 oz (106 kg)  05/17/19 222 lb (100.7 kg)  03/27/19 234 lb (106.1 kg)   LABS: Lab Results  Component Value Date   HGBA1C 7.7 (H) 05/28/2019   HGBA1C 8.2 (H) 02/13/2019   HGBA1C 7.7 (H) 11/13/2018   Lab Results  Component Value Date   MICROALBUR 0.7 02/13/2019   LDLCALC 48 02/13/2019   CREATININE 1.74 (H) 07/06/2019       OTHER active problems are discussed in review of systems    Allergies as of 07/06/2019      Reactions   Bactrim [sulfamethoxazole-trimethoprim] Nausea And Vomiting, Other (See Comments)   Bleeding and ulcers  Morphine And Related Nausea And Vomiting      Medication List       Accurate as of July 06, 2019 11:59 PM. If you have any questions, ask your nurse or doctor.        Accu-Chek Aviva Plus test strip Generic drug: glucose blood USE AS INSTRUCTED TO CHECK BLOOD SUGAR TWICE A DAY DX:E11.65   acetaminophen 500 MG tablet Commonly known as: TYLENOL Take 500 mg by mouth every 4 (four) hours as needed for moderate pain or headache.   amiodarone 200 MG tablet Commonly known as: PACERONE Take 0.5 tablets (100 mg total) by mouth daily.   apixaban 2.5 MG Tabs tablet Commonly known as: Eliquis Take 1 tablet (2.5 mg total) by mouth 2 (two) times daily.   B-D UF III MINI PEN NEEDLES 31G X 5 MM Misc Generic drug: Insulin Pen Needle USE 2 PEN NEEDLE PER DAY WITH HUMALOG AND VICTOZA   BD Insulin Syringe U/F 31G X 5/16" 1 ML Misc  Generic drug: Insulin Syringe-Needle U-100 USE TO INJECT INSULIN DAILY   ferrous sulfate 325 (65 FE) MG tablet Take 325 mg by mouth 2 (two) times daily with a meal.   fish oil-omega-3 fatty acids 1000 MG capsule Take 1 g by mouth daily.   furosemide 80 MG tablet Commonly known as: LASIX Take 1 tablet by mouth daily in the morning.  Take a half tablet by mouth daily in the evening.   glimepiride 2 MG tablet Commonly known as: AMARYL Take one tablet twice daily   HumaLOG KwikPen 100 UNIT/ML KwikPen Generic drug: insulin lispro Inject 10 Units into the skin daily. Inject 10 units under the skin before the main meal of the day. What changed: Another medication with the same name was removed. Continue taking this medication, and follow the directions you see here. Changed by: [name], MD   insulin NPH Human 100 UNIT/ML injection Commonly known as: HumuLIN N Inject 26 units of Humulin N under the skin once daily at bedtime. What changed: additional instructions   liraglutide 18 MG/3ML Sopn Commonly known as: Victoza INJECT 1.8 MG UNDER THE SKIN AT BEDTIME   metolazone 5 MG tablet Commonly known as: ZAROXOLYN Take 1 tablet (5 mg total) by mouth once a week. On Monday   multivitamin with minerals Tabs tablet Take 1 tablet by mouth daily. Mens One a day Vit   olmesartan 20 MG tablet Commonly known as: BENICAR TAKE 1 TABLET BY MOUTH EVERY DAY   pantoprazole 40 MG tablet Commonly known as: PROTONIX Take 1 tablet (40 mg total) by mouth 2 (two) times daily.   pravastatin 80 MG tablet Commonly known as: PRAVACHOL Take 1 tablet (80 mg total) by mouth every evening.   PRESERVISION AREDS 2 PO Take 1 capsule by mouth 2 (two) times daily.   tamsulosin 0.4 MG Caps capsule Commonly known as: FLOMAX Take 1 capsule daily   vitamin C 500 MG tablet Commonly known as: ASCORBIC ACID Take 500 mg by mouth daily.       Allergies:  Allergies  Allergen Reactions  . Bactrim  [Sulfamethoxazole-Trimethoprim] Nausea And Vomiting and Other (See Comments)    Bleeding and ulcers  . Morphine And Related Nausea And Vomiting    Past Medical History:  Diagnosis Date  . Anemia   . Arthritis   . CAD (coronary artery disease)    a. cardiac cath 12/2016 showing moderate AS, elevated LVEDP, heavy 3V coronary calcification, 100% mD2, 30-50% LAD, 50-90% stenosis  of Cx proximal to origin of L-PDA, RCA not engaged due to poor catheter control (difficult procedure) - coronary status was essentially unchanged from prior.  . Chronic diastolic CHF (congestive heart failure) ([place])   . Degenerative arthritis   . Diabetes ([place])   . Dyspnea   . Essential hypertension   . Hyperlipidemia   . New onset a-fib ([place]) 03/04/2018  . Obesity (BMI 30-39.9) 06/18/2015  . OSA (obstructive sleep apnea)    Severe with AHI 27/hr now on CPAP  not compliant with treatment.  . Peritonitis ([place]) 1985?  [name] Pneumonia    "6 months - 85 years old"  . Pure hypercholesterolemia   . RBBB   . [facility] spotted fever   . 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 Severe aortic stenosis    a. s/p TAVR 03/2017.  . Type II or unspecified type diabetes mellitus without mention of complication, not stated as uncontrolled     Past Surgical History:  Procedure Laterality Date  . APPENDECTOMY    . CARDIOVERSION N/A 04/03/2018   Procedure: CARDIOVERSION;  Surgeon: Josue Hector, MD;  Location[facility] ENDOSCOPY;  Service: Cardiovascular;  Laterality: N/A;  . CARDIOVERSION N/A 01/18/2019   Procedure: CARDIOVERSION;  Surgeon: Skeet Latch, MD;  Location: [place];  Service: Cardiovascular;  Laterality: N/A;  . CARPAL TUNNEL RELEASE    . COLECTOMY     partial  . COLONOSCOPY WITH PROPOFOL N/A 11/18/2017   Procedure: COLONOSCOPY WITH PROPOFOL;  Surgeon: Carol Ada, MD;  Location: [facility]  ENDOSCOPY;  Service: Endoscopy;  Laterality: N/A;  . ESOPHAGOGASTRODUODENOSCOPY N/A 11/18/2017   Procedure: ESOPHAGOGASTRODUODENOSCOPY (EGD);  Surgeon: Carol Ada, MD;  Location: [facility] ENDOSCOPY;  Service: Endoscopy;  Laterality: N/A;  . EYE SURGERY Bilateral    cataracts removed, lens placed  . JOINT REPLACEMENT    . KNEE CARTILAGE SURGERY    . PARTIAL COLECTOMY    . RIGHT/LEFT HEART CATH AND CORONARY ANGIOGRAPHY N/A 12/28/2016   Procedure: Right/Left Heart Cath and Coronary Angiography;  Surgeon: Belva Crome, MD;  Location: [place] CV LAB;  Service: Cardiovascular;  Laterality: N/A;  . TEE WITHOUT CARDIOVERSION N/A 03/15/2017   Procedure: TRANSESOPHAGEAL ECHOCARDIOGRAM (TEE);  Surgeon: Burnell Blanks, MD;  Location: [place];  Service: Open Heart Surgery;  Laterality: N/A;  . TOTAL KNEE ARTHROPLASTY Right 11/08/2017  . TOTAL KNEE ARTHROPLASTY Right 11/08/2017   Procedure: TOTAL KNEE ARTHROPLASTY;  Surgeon: Melrose Nakayama, MD;  Location: [place];  Service: Orthopedics;  Laterality: Right;  . TRANSCATHETER AORTIC VALVE REPLACEMENT, TRANSFEMORAL N/A 03/15/2017   Procedure: TRANSCATHETER AORTIC VALVE REPLACEMENT, TRANSFEMORAL;  Surgeon: Burnell Blanks, MD;  Location: [place];  Service: Open Heart Surgery;  Laterality: N/A;  . VEIN SURGERY      Family History  Problem Relation Age of Onset  . Heart failure Father   . Emphysema Father   . Hypertension Mother   . Cancer Sister   . Healthy Sister     Social History:  reports that he has never smoked. He has never used smokeless tobacco. He reports that he does not drink alcohol or use drugs.  Review of Systems:     HYPERTENSION:   Has been being treated with Benicar 20 mg Followed by cardiologist    BP Readings from Last 3 Encounters:  07/06/19 134/60  05/17/19 (!) 127/59  03/27/19 (!) 142/56   Creatinine  is worse as of 9/20  Lab Results  Component Value Date   CREATININE 1.74 (H) 07/06/2019   CREATININE 2.21  (H) 05/28/2019   CREATININE 1.83 (H) 03/01/2019    CARDIAC history:   No shortness of breath on exertion since his aortic valve replacement  EDEMA: He is on 80 mg a.m. -- 40 mg in p.m. daily of the Lasix dose  Taking Zaroxolyn once a week also He does have associated LV dysfunction  HYPERLIPIDEMIA: The lipid abnormality consists of elevated LDL and he is taking 80 mg Pravachol,  LDL is below 70 Triglycerides have been normal   Lab Results  Component Value Date   CHOL 112 02/13/2019   HDL 48.10 02/13/2019   LDLCALC 48 02/13/2019   LDLDIRECT 101.0 04/26/2016   TRIG 84.0 02/13/2019   CHOLHDL 2 02/13/2019     IRON deficiency anemia:  Also has renal insufficiency  No history of B-12 deficiency  Last Hemoccult was as below  CBC Latest Ref Rng & Units 05/28/2019 12/27/2018 12/19/2018  WBC 4.0 - 10.5 K/uL 6.7 10.3 9.6  Hemoglobin 13.0 - 17.0 g/dL 11.1(L) 10.9(L) 10.7(L)  Hematocrit 39.0 - 52.0 % 33.6(L) 34.0(L) 33.3(L)  Platelets 150.0 - 400.0 K/uL 142.0(L) 179 197   Lab Results  Component Value Date   OCCULTBLD Negative 06/26/2018   OCCULTBLD POSITIVE (A) 11/16/2017   OCCULTBLD Negative 06/07/2017      ROS    Physical Exam    BP 134/60 (BP Location: Left Arm, Patient Position: Sitting, Cuff Size: Normal)   Pulse (!) 35   Ht 5\' 8"  (1.727 m)   Wt 233 lb 9.6 oz (106 kg)   SpO2 98%   BMI 35.52 kg/m       ASSESSMENT/ PLAN:   Diabetes type 2 with obesity  See history of present illness for detailed discussion of his current management, blood sugar patterns and problems identified  His A1c is back down to 7.7, was 8.2  A regimen of NPH and NovoLog along with Victoza and metformin He has not changed his insulin regimen as directed on the last visit However still having low normal readings in the mornings and this may be bring his A1c down  Discussed needing to avoid overnight hypoglycemia and also reduce tendency to late evening hyperglycemia which is  postprandial despite taking Victoza and relatively small dose of Humalog Not clear if his blood sugars are high after breakfast or in the afternoon if he has any meals or snacks  Recommendations: To increase his NovoLog by 2 to 4 units at dinnertime as discussed before and will need to take 12 to 14 units regularly Make sure he takes his NovoLog a few minutes before eating REDUCE NPH to 20 units at bedtime He will also reduce his Amaryl to once a day in the morning only and stop evening dose Discussed avoiding low sugars overnight Check sugars after breakfast occasionally and in the afternoon if eating a meal Instead of bedtime check readings about 2 hours after dinner and discussed blood sugar targets at that time Keep portions of high fat foods and starchy foods small Walk as much as tolerated   HYPERTENSION: Blood pressure is well treated with Benicar 20 mg, although has mild orthostasis he is not symptomatic  RENAL dysfunction: This is significantly worse as of last month, likely to be from multiple factors but not diabetes, has no microalbuminuria He will have repeat renal function done again today  Amiodarone therapy: Needs screening TSH  ANEMIA: This needs to be evaluated and followed by PCP   Counseling time on subjects discussed in assessment and plan sections is over 50% of today's 25 minute visit  Influenza vaccine given, patient handout also given   Patient Instructions  N insulin 20 units daily  Humalog 12-14 based on meal size   Check blood sugars on waking up 2-6 days a week  Also check blood sugars about 2 hours after meals and do this after different meals by rotation  Recommended blood sugar levels on waking up are 90-130 and about 2 hours after meal is 130-160  Please bring your blood sugar monitor to each visit, thank you  Stop Glimeperide in pm     [name] 07/07/2019, 8:50 PM   Patient Instructions  N insulin 20 units daily  Humalog 12-14  based on meal size   Check blood sugars on waking up 2-6 days a week  Also check blood sugars about 2 hours after meals and do this after different meals by rotation  Recommended blood sugar levels on waking up are 90-130 and about 2 hours after meal is 130-160  Please bring your blood sugar monitor to each visit, thank you  Stop Glimeperide in pm   ADDENDUM: Creatinine is improved, no changes needed in treatment

## 2019-07-06 NOTE — Patient Instructions (Addendum)
N insulin 20 units daily  Humalog 12-14 based on meal size   Check blood sugars on waking up 2-6 days a week  Also check blood sugars about 2 hours after meals and do this after different meals by rotation  Recommended blood sugar levels on waking up are 90-130 and about 2 hours after meal is 130-160  Please bring your blood sugar monitor to each visit, thank you  Stop Glimeperide in pm

## 2019-07-07 LAB — BASIC METABOLIC PANEL
BUN/Creatinine Ratio: 33 — ABNORMAL HIGH (ref 10–24)
BUN: 58 mg/dL — ABNORMAL HIGH (ref 8–27)
CO2: 20 mmol/L (ref 20–29)
Calcium: 9.2 mg/dL (ref 8.6–10.2)
Chloride: 103 mmol/L (ref 96–106)
Creatinine, Ser: 1.74 mg/dL — ABNORMAL HIGH (ref 0.76–1.27)
GFR calc Af Amer: 41 mL/min/{1.73_m2} — ABNORMAL LOW (ref >59)
GFR calc non Af Amer: 35 mL/min/{1.73_m2} — ABNORMAL LOW (ref >59)
Glucose: 129 mg/dL — ABNORMAL HIGH (ref 65–99)
Potassium: 4.6 mmol/L (ref 3.5–5.2)
Sodium: 137 mmol/L (ref 134–144)

## 2019-07-07 LAB — T4, FREE: Free T4: 1.49 ng/dL (ref 0.82–1.77)

## 2019-07-07 LAB — TSH: TSH: 0.671 u[IU]/mL (ref 0.450–4.500)

## 2019-07-07 NOTE — Progress Notes (Signed)
Please call to let patient know that the kidney function results are back to baseline and no further action needed

## 2019-07-09 ENCOUNTER — Telehealth: Payer: Self-pay | Admitting: Interventional Cardiology

## 2019-07-09 ENCOUNTER — Encounter: Payer: Self-pay | Admitting: Interventional Cardiology

## 2019-07-09 ENCOUNTER — Other Ambulatory Visit: Payer: Self-pay | Admitting: Interventional Cardiology

## 2019-07-09 ENCOUNTER — Other Ambulatory Visit: Payer: Self-pay | Admitting: Endocrinology

## 2019-07-09 DIAGNOSIS — N184 Chronic kidney disease, stage 4 (severe): Secondary | ICD-10-CM

## 2019-07-09 DIAGNOSIS — I5042 Chronic combined systolic (congestive) and diastolic (congestive) heart failure: Secondary | ICD-10-CM

## 2019-07-09 NOTE — Telephone Encounter (Signed)
Wife states that recently pt has had labs drawn at PCP office.  Creatinine at the end of Sept was 2.21, most recent on 10/30 was 1.74.  These labs are in Epic.  States weight is stable, no swelling and pt is feeling good.  Wife concerned about elevated creatinine for an extended period of time.  She wanted to know if Dr. Tamala Julian felt like any changes needed to be made to pt's medications?  Advised I would send to him for review but not likely since pt is doing well.   Wife also mentioned that she recently had Covid.  Pt was tested and was negative.  She wanted to know about getting an antibody test for her and her husband.  I advised her to reach out to their PCPs about that.

## 2019-07-09 NOTE — Telephone Encounter (Signed)
Eliquis 5mg  refill request received. Pt is 85yrs old, weight-106kg, Crea-1.74 on 06/26/2019, Diagnosis-Afib, and last seen by Dr. Tamala Julian on 05/17/2019. Last refill wast sent in on 01/15/2019 and it was for the correct dose of Eliquis 2.5mg  BID, which is the correct dose, as it was sent to local pharmacy. [facility] dose. Called pt to clarify what he has been taking and he had used his left over 5mg  tabs and has been splitting in half to get 2.5mg  BID. Pt is aware the Eliquis 2.5mg  BID will be sent over to [facility]. Placed a note on the refill for the pharmacy as well.

## 2019-07-09 NOTE — Telephone Encounter (Signed)
Patient's spouse called she would like to speak to nurse she has a few questions she would like to discuss.

## 2019-07-09 NOTE — Telephone Encounter (Signed)
Error

## 2019-07-09 NOTE — Telephone Encounter (Signed)
I do not think an antibody test is indicated.  He should have a basic metabolic panel repeated in early to mid December.

## 2019-07-11 NOTE — Telephone Encounter (Signed)
Left message to call back  

## 2019-07-13 NOTE — Telephone Encounter (Signed)
Spoke with wife and scheduled pt to come for labs on 12/14.  Wife verbalized understanding and was appreciative for call.

## 2019-07-13 NOTE — Telephone Encounter (Signed)
Patient's wife [name] returned call.

## 2019-08-03 ENCOUNTER — Other Ambulatory Visit: Payer: Self-pay | Admitting: Endocrinology

## 2019-08-15 ENCOUNTER — Other Ambulatory Visit: Payer: Medicare Other | Admitting: *Deleted

## 2019-08-15 ENCOUNTER — Other Ambulatory Visit: Payer: Self-pay

## 2019-08-15 DIAGNOSIS — I5042 Chronic combined systolic (congestive) and diastolic (congestive) heart failure: Secondary | ICD-10-CM

## 2019-08-15 DIAGNOSIS — N184 Chronic kidney disease, stage 4 (severe): Secondary | ICD-10-CM

## 2019-08-15 LAB — BASIC METABOLIC PANEL
BUN/Creatinine Ratio: 31 — ABNORMAL HIGH (ref 10–24)
BUN: 74 mg/dL — ABNORMAL HIGH (ref 8–27)
CO2: 22 mmol/L (ref 20–29)
Calcium: 9.2 mg/dL (ref 8.6–10.2)
Chloride: 98 mmol/L (ref 96–106)
Creatinine, Ser: 2.38 mg/dL — ABNORMAL HIGH (ref 0.76–1.27)
GFR calc Af Amer: 28 mL/min/{1.73_m2} — ABNORMAL LOW (ref >59)
GFR calc non Af Amer: 24 mL/min/{1.73_m2} — ABNORMAL LOW (ref >59)
Glucose: 153 mg/dL — ABNORMAL HIGH (ref 65–99)
Potassium: 4.5 mmol/L (ref 3.5–5.2)
Sodium: 137 mmol/L (ref 134–144)

## 2019-08-17 ENCOUNTER — Other Ambulatory Visit: Payer: Self-pay

## 2019-08-17 DIAGNOSIS — I5042 Chronic combined systolic (congestive) and diastolic (congestive) heart failure: Secondary | ICD-10-CM

## 2019-08-17 DIAGNOSIS — N184 Chronic kidney disease, stage 4 (severe): Secondary | ICD-10-CM

## 2019-08-17 MED ORDER — FUROSEMIDE 80 MG PO TABS: 80.0000 mg | ORAL_TABLET | Freq: Every day | ORAL | 3 refills | Status: DC

## 2019-08-20 ENCOUNTER — Other Ambulatory Visit: Payer: Medicare Other

## 2019-09-03 ENCOUNTER — Other Ambulatory Visit: Payer: Medicare Other | Admitting: *Deleted

## 2019-09-03 ENCOUNTER — Other Ambulatory Visit: Payer: Self-pay

## 2019-09-03 ENCOUNTER — Other Ambulatory Visit (INDEPENDENT_AMBULATORY_CARE_PROVIDER_SITE_OTHER): Payer: Medicare Other

## 2019-09-03 DIAGNOSIS — E1165 Type 2 diabetes mellitus with hyperglycemia: Secondary | ICD-10-CM

## 2019-09-03 DIAGNOSIS — D508 Other iron deficiency anemias: Secondary | ICD-10-CM

## 2019-09-03 DIAGNOSIS — Z794 Long term (current) use of insulin: Secondary | ICD-10-CM | POA: Diagnosis not present

## 2019-09-03 DIAGNOSIS — N184 Chronic kidney disease, stage 4 (severe): Secondary | ICD-10-CM

## 2019-09-03 DIAGNOSIS — I5042 Chronic combined systolic (congestive) and diastolic (congestive) heart failure: Secondary | ICD-10-CM

## 2019-09-03 LAB — CBC WITH DIFFERENTIAL/PLATELET
Basophils Absolute: 0 10*3/uL (ref 0.0–0.1)
Basophils Relative: 0.4 % (ref 0.0–3.0)
Eosinophils Absolute: 0.1 10*3/uL (ref 0.0–0.7)
Eosinophils Relative: 1.1 % (ref 0.0–5.0)
HCT: 31.9 % — ABNORMAL LOW (ref 39.0–52.0)
Hemoglobin: 10.6 g/dL — ABNORMAL LOW (ref 13.0–17.0)
Lymphocytes Relative: 19.4 % (ref 12.0–46.0)
Lymphs Abs: 1.3 10*3/uL (ref 0.7–4.0)
MCHC: 33.1 g/dL (ref 30.0–36.0)
MCV: 96.4 fl (ref 78.0–100.0)
Monocytes Absolute: 0.7 10*3/uL (ref 0.1–1.0)
Monocytes Relative: 10.8 % (ref 3.0–12.0)
Neutro Abs: 4.6 10*3/uL (ref 1.4–7.7)
Neutrophils Relative %: 68.3 % (ref 43.0–77.0)
Platelets: 106 10*3/uL — ABNORMAL LOW (ref 150.0–400.0)
RBC: 3.31 Mil/uL — ABNORMAL LOW (ref 4.22–5.81)
RDW: 14.6 % (ref 11.5–15.5)
WBC: 6.7 10*3/uL (ref 4.0–10.5)

## 2019-09-03 LAB — BASIC METABOLIC PANEL
BUN: 53 mg/dL — ABNORMAL HIGH (ref 6–23)
CO2: 21 mEq/L (ref 19–32)
Calcium: 9.2 mg/dL (ref 8.4–10.5)
Chloride: 107 mEq/L (ref 96–112)
Creatinine, Ser: 1.88 mg/dL — ABNORMAL HIGH (ref 0.40–1.50)
GFR: 34.4 mL/min — ABNORMAL LOW (ref >60.00)
Glucose, Bld: 219 mg/dL — ABNORMAL HIGH (ref 70–99)
Potassium: 4.5 mEq/L (ref 3.5–5.1)
Sodium: 137 mEq/L (ref 135–145)

## 2019-09-03 LAB — IBC PANEL
Iron: 49 ug/dL (ref 42–165)
Saturation Ratios: 16.7 % — ABNORMAL LOW (ref 20.0–50.0)
Transferrin: 210 mg/dL — ABNORMAL LOW (ref 212.0–360.0)

## 2019-09-03 LAB — HEMOGLOBIN A1C: Hgb A1c MFr Bld: 7.4 % — ABNORMAL HIGH (ref 4.6–6.5)

## 2019-09-04 DIAGNOSIS — I5033 Acute on chronic diastolic (congestive) heart failure: Secondary | ICD-10-CM

## 2019-09-04 LAB — BASIC METABOLIC PANEL
BUN/Creatinine Ratio: 25 — ABNORMAL HIGH (ref 10–24)
BUN: 47 mg/dL — ABNORMAL HIGH (ref 8–27)
CO2: 18 mmol/L — ABNORMAL LOW (ref 20–29)
Calcium: 9.3 mg/dL (ref 8.6–10.2)
Chloride: 106 mmol/L (ref 96–106)
Creatinine, Ser: 1.89 mg/dL — ABNORMAL HIGH (ref 0.76–1.27)
GFR calc Af Amer: 37 mL/min/{1.73_m2} — ABNORMAL LOW (ref >59)
GFR calc non Af Amer: 32 mL/min/{1.73_m2} — ABNORMAL LOW (ref >59)
Glucose: 191 mg/dL — ABNORMAL HIGH (ref 65–99)
Potassium: 4.8 mmol/L (ref 3.5–5.2)
Sodium: 140 mmol/L (ref 134–144)

## 2019-09-04 LAB — PRO B NATRIURETIC PEPTIDE: NT-Pro BNP: 3705 pg/mL — ABNORMAL HIGH (ref 0–486)

## 2019-09-06 ENCOUNTER — Other Ambulatory Visit: Payer: Self-pay

## 2019-09-06 ENCOUNTER — Encounter: Payer: Self-pay | Admitting: Endocrinology

## 2019-09-06 ENCOUNTER — Telehealth: Payer: Self-pay | Admitting: Interventional Cardiology

## 2019-09-06 ENCOUNTER — Ambulatory Visit (INDEPENDENT_AMBULATORY_CARE_PROVIDER_SITE_OTHER): Payer: Medicare Other | Admitting: Endocrinology

## 2019-09-06 VITALS — BP 140/68 | HR 75 | Ht 68.0 in | Wt 244.0 lb

## 2019-09-06 DIAGNOSIS — I5033 Acute on chronic diastolic (congestive) heart failure: Secondary | ICD-10-CM

## 2019-09-06 DIAGNOSIS — Z794 Long term (current) use of insulin: Secondary | ICD-10-CM

## 2019-09-06 DIAGNOSIS — D508 Other iron deficiency anemias: Secondary | ICD-10-CM

## 2019-09-06 DIAGNOSIS — E1165 Type 2 diabetes mellitus with hyperglycemia: Secondary | ICD-10-CM | POA: Diagnosis not present

## 2019-09-06 DIAGNOSIS — N1832 Chronic kidney disease, stage 3b: Secondary | ICD-10-CM

## 2019-09-06 DIAGNOSIS — I10 Essential (primary) hypertension: Secondary | ICD-10-CM | POA: Diagnosis not present

## 2019-09-06 DIAGNOSIS — E669 Obesity, unspecified: Secondary | ICD-10-CM

## 2019-09-06 MED ORDER — VICTOZA 18 MG/3ML ~~LOC~~ SOPN: PEN_INJECTOR | SUBCUTANEOUS | 2 refills | Status: DC

## 2019-09-06 NOTE — Telephone Encounter (Signed)
I spoke to the patient's wife who called because the patient's Lasix has recently been decreased 12/11 from 80 mg in the morning/40 mg in the afternoon to 80 mg only, daily due to kidney function.    The patient has experienced swelling in his legs/ankles and abdomen, gaining 6 lbs in a few weeks.  He is not SOB.  His BNP came back at 3,705 from 12/28.  I spoke to DOD, Dr Angelena Form who suggested to take an extra 40 mg of Lasix every other day.  I called patient back with advisement.  They will call Monday 1/4 with update.

## 2019-09-06 NOTE — Patient Instructions (Addendum)
Call Dr Raoul Pitch re: anemia  May take 4 Units for oatmeal Humalog   Check blood sugars on waking up 3 days a week  Also check blood sugars about 2 hours after meals and do this after different meals by rotation  Recommended blood sugar levels on waking up are 90-130 and about 2 hours after meal is 130-180  Please bring your blood sugar monitor to each visit, thank you  Novolin N 20 units

## 2019-09-06 NOTE — Progress Notes (Signed)
Patient ID: Todd Romero, male   DOB: [birth year], 85 y.o.   MRN: [id number]    Reason for Appointment:   Follow-up    History of Present Illness   Diagnosis: Type 2 DIABETES MELITUS, date of diagnosis:  1997   He has been on various regimens for his diabetes and has been on bedtime insulin with NPH since 2005 He also has benefited from adding Victoza in 2011 with better postprandial control and some weight loss Previously his weight has been as much as 247 pounds  On Victoza  since 11/14 with further improvement in blood sugar control. Since about 2015 his blood sugars have been overall mildly high with A1c over 7% consistently.  RECENT HISTORY:  His A1c is back down to 7.4 compared to 7.7 However does have anemia and renal dysfunction  Non-insulin hypoglycemic drugs: Amaryl 2 mg am., Victoza 1.8 mg daily        Side effects from medications: None  Insulin regimen: NPH 22 units at bedtime and Humalog 10-12 units at suppertime    Current management, blood sugar patterns and problems identified:  He was told to reduce his bedtime dose of NPH to 22 but he is still taking 24 units  He has been taken off Amaryl in the evening at the same time  He is not aware of what his blood sugars are 2 hours after meals as he does not check these  Only checking blood sugars on waking up and bedtime  Bedtime readings are about 4 to 5 hours after his evening meals  After his dinner he may sometimes have snacks or sweets causing her sugars to be higher  This may explain the fluctuation he has at the bedtime blood sugar reading  Only once he felt a slightly hypoglycemic overnight with blood sugar 71 but otherwise no documented hypoglycemia  fasting blood sugars are fairly good with only occasional high readings and not as low as the last time  Renal function has been impaired and he is not on Metformin now  Not able to do any significant exercise  Has not been able to  adjust his Humalog based on what he is eating since he thinks that his blood sugars are somewhat unpredictable with similar meals  After BREAKFAST his blood sugar was over 200 and he is eating a higher carbohydrate meal 3 days a week with oatmeal otherwise eggs for protein  His weight is fluctuated, recently higher from reducing Lasix  Monitors blood glucose:about 2x a day.    Glucometer:  Accu-Chek         Blood Glucose readings from meter download:    PRE-MEAL Fasting Lunch Dinner Bedtime Overall  Glucose range: 82-165    109-275 82-275  Mean/median: 114    184 149   Previous readings:  PRE-MEAL Fasting Lunch Dinner Bedtime Overall  Glucose range:  57-125    89-327  57-327  Mean/median: 85    185  133+/-62    Meal times: Dinner about 5-6 PM  Wt Readings from Last 3 Encounters:  09/06/19 244 lb (110.7 kg)  07/06/19 233 lb 9.6 oz (106 kg)  05/17/19 222 lb (100.7 kg)   LABS: Lab Results  Component Value Date   HGBA1C 7.4 (H) 09/03/2019   HGBA1C 7.7 (H) 05/28/2019   HGBA1C 8.2 (H) 02/13/2019   Lab Results  Component Value Date   MICROALBUR 0.7 02/13/2019   LDLCALC 48 02/13/2019   CREATININE 1.89 (H) 09/03/2019  OTHER active problems are discussed in review of systems    Allergies as of 09/06/2019      Reactions   Bactrim [sulfamethoxazole-trimethoprim] Nausea And Vomiting, Other (See Comments)   Bleeding and ulcers   Morphine And Related Nausea And Vomiting      Medication List       Accurate as of September 06, 2019 11:59 PM. If you have any questions, ask your nurse or doctor.        Accu-Chek Aviva Plus test strip Generic drug: glucose blood USE AS INSTRUCTED TO CHECK BLOOD SUGAR TWICE A DAY DX:E11.65   acetaminophen 500 MG tablet Commonly known as: TYLENOL Take 500 mg by mouth every 4 (four) hours as needed for moderate pain or headache.   amiodarone 200 MG tablet Commonly known as: PACERONE Take 0.5 tablets (100 mg total) by mouth daily.    apixaban 2.5 MG Tabs tablet Commonly known as: Eliquis Take 1 tablet (2.5 mg total) by mouth 2 (two) times daily. What changed: Another medication with the same name was removed. Continue taking this medication, and follow the directions you see here. Changed by: [name], MD   B-D UF III MINI PEN NEEDLES 31G X 5 MM Misc Generic drug: Insulin Pen Needle USE 2 PEN NEEDLE PER DAY WITH HUMALOG AND VICTOZA   BD Insulin Syringe U/F 31G X 5/16" 1 ML Misc Generic drug: Insulin Syringe-Needle U-100 USE TO INJECT INSULIN DAILY   ferrous sulfate 325 (65 FE) MG tablet Take 325 mg by mouth 2 (two) times daily with a meal.   fish oil-omega-3 fatty acids 1000 MG capsule Take 1 g by mouth daily.   furosemide 80 MG tablet Commonly known as: LASIX Take 1 tablet (80 mg total) by mouth daily.   glimepiride 2 MG tablet Commonly known as: AMARYL TAKE 1 TABLET before breakfast daily   HumaLOG KwikPen 100 UNIT/ML KwikPen Generic drug: insulin lispro Inject 12 Units into the skin daily. Inject 12 units under the skin before the main meal of the day.   insulin NPH Human 100 UNIT/ML injection Commonly known as: HumuLIN N Inject 26 units of Humulin N under the skin once daily at bedtime. What changed:   how much to take  how to take this  when to take this  additional instructions   metolazone 5 MG tablet Commonly known as: ZAROXOLYN Take 1 tablet (5 mg total) by mouth once a week. On Monday   multivitamin with minerals Tabs tablet Take 1 tablet by mouth daily. Mens One a day Vit   olmesartan 20 MG tablet Commonly known as: BENICAR TAKE 1 TABLET BY MOUTH EVERY DAY   pantoprazole 40 MG tablet Commonly known as: PROTONIX Take 1 tablet (40 mg total) by mouth 2 (two) times daily.   pravastatin 80 MG tablet Commonly known as: PRAVACHOL Take 1 tablet (80 mg total) by mouth every evening.   PRESERVISION AREDS 2 PO Take 1 capsule by mouth 2 (two) times daily.   tamsulosin 0.4 MG  Caps capsule Commonly known as: FLOMAX Take 1 capsule daily   Victoza 18 MG/3ML Sopn Generic drug: liraglutide INJECT 1.8 MG UNDER THE SKIN AT BEDTIME   vitamin C 500 MG tablet Commonly known as: ASCORBIC ACID Take 500 mg by mouth daily.       Allergies:  Allergies  Allergen Reactions  . Bactrim [Sulfamethoxazole-Trimethoprim] Nausea And Vomiting and Other (See Comments)    Bleeding and ulcers  . Morphine And Related Nausea And Vomiting  Past Medical History:  Diagnosis Date  . Anemia   . Arthritis   . CAD (coronary artery disease)    a. cardiac cath 12/2016 showing moderate AS, elevated LVEDP, heavy 3V coronary calcification, 100% mD2, 30-50% LAD, 50-90% stenosis of Cx proximal to origin of L-PDA, RCA not engaged due to poor catheter control (difficult procedure) - coronary status was essentially unchanged from prior.  . Chronic diastolic CHF (congestive heart failure) ([place])   . Degenerative arthritis   . Diabetes ([place])   . Dyspnea   . Essential hypertension   . Hyperlipidemia   . New onset a-fib ([place]) 03/04/2018  . Obesity (BMI 30-39.9) 06/18/2015  . OSA (obstructive sleep apnea)    Severe with AHI 27/hr now on CPAP  not compliant with treatment.  . Peritonitis ([place]) 1985?  [name] Pneumonia    "6 months - 85 years old"  . Pure hypercholesterolemia   . RBBB   . [facility] spotted fever   . 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 Severe aortic stenosis    a. s/p TAVR 03/2017.  . Type II or unspecified type diabetes mellitus without mention of complication, not stated as uncontrolled     Past Surgical History:  Procedure Laterality Date  . APPENDECTOMY    . CARDIOVERSION N/A 04/03/2018   Procedure: CARDIOVERSION;  Surgeon: Josue Hector, MD;  Location[facility] ENDOSCOPY;  Service: Cardiovascular;  Laterality: N/A;  . CARDIOVERSION N/A  01/18/2019   Procedure: CARDIOVERSION;  Surgeon: Skeet Latch, MD;  Location: [place];  Service: Cardiovascular;  Laterality: N/A;  . CARPAL TUNNEL RELEASE    . COLECTOMY     partial  . COLONOSCOPY WITH PROPOFOL N/A 11/18/2017   Procedure: COLONOSCOPY WITH PROPOFOL;  Surgeon: Carol Ada, MD;  Location: [facility] ENDOSCOPY;  Service: Endoscopy;  Laterality: N/A;  . ESOPHAGOGASTRODUODENOSCOPY N/A 11/18/2017   Procedure: ESOPHAGOGASTRODUODENOSCOPY (EGD);  Surgeon: Carol Ada, MD;  Location: [facility] ENDOSCOPY;  Service: Endoscopy;  Laterality: N/A;  . EYE SURGERY Bilateral    cataracts removed, lens placed  . JOINT REPLACEMENT    . KNEE CARTILAGE SURGERY    . PARTIAL COLECTOMY    . RIGHT/LEFT HEART CATH AND CORONARY ANGIOGRAPHY N/A 12/28/2016   Procedure: Right/Left Heart Cath and Coronary Angiography;  Surgeon: Belva Crome, MD;  Location: [place] CV LAB;  Service: Cardiovascular;  Laterality: N/A;  . TEE WITHOUT CARDIOVERSION N/A 03/15/2017   Procedure: TRANSESOPHAGEAL ECHOCARDIOGRAM (TEE);  Surgeon: Burnell Blanks, MD;  Location: [place];  Service: Open Heart Surgery;  Laterality: N/A;  . TOTAL KNEE ARTHROPLASTY Right 11/08/2017  . TOTAL KNEE ARTHROPLASTY Right 11/08/2017   Procedure: TOTAL KNEE ARTHROPLASTY;  Surgeon: Melrose Nakayama, MD;  Location: [place];  Service: Orthopedics;  Laterality: Right;  . TRANSCATHETER AORTIC VALVE REPLACEMENT, TRANSFEMORAL N/A 03/15/2017   Procedure: TRANSCATHETER AORTIC VALVE REPLACEMENT, TRANSFEMORAL;  Surgeon: Burnell Blanks, MD;  Location: [place];  Service: Open Heart Surgery;  Laterality: N/A;  . VEIN SURGERY      Family History  Problem Relation Age of Onset  . Heart failure Father   . Emphysema Father   . Hypertension Mother   . Cancer Sister   . Healthy Sister     Social History:  reports that he has never smoked. He has never used smokeless tobacco. He reports that he does not drink alcohol or use drugs.  Review of  Systems:      HYPERTENSION:   Has been being treated with Benicar 20 mg Followed by cardiologist    BP Readings from Last 3 Encounters:  09/06/19 140/68  07/06/19 (!) 124/58  05/17/19 (!) 127/59   Renal dysfunction: Not associated with microalbuminuria Creatinine history:  Lab Results  Component Value Date   CREATININE 1.89 (H) 09/03/2019   CREATININE 1.88 (H) 09/03/2019   CREATININE 2.38 (H) 08/15/2019    CARDIAC history:   No shortness of breath on exertion since his aortic valve replacement  EDEMA: He is on 80 mg a.m. . daily of the Lasix dose and evening dose was stopped probably because of renal dysfunction Taking Zaroxolyn once a week also He does have associated LV dysfunction  HYPERLIPIDEMIA: The lipid abnormality consists of elevated LDL and he is taking 80 mg Pravachol,  LDL is below 70 Triglycerides have been normal   Lab Results  Component Value Date   CHOL 112 02/13/2019   HDL 48.10 02/13/2019   LDLCALC 48 02/13/2019   LDLDIRECT 101.0 04/26/2016   TRIG 84.0 02/13/2019   CHOLHDL 2 02/13/2019     IRON deficiency anemia: This is persistent Despite taking iron twice a day he still has low iron saturation and hemoglobin is low  Also has renal insufficiency without proteinuria  No history of B-12 deficiency  Last Hemoccult was last year He still has not established with his PCP as directed  CBC Latest Ref Rng & Units 09/03/2019 05/28/2019 12/27/2018  WBC 4.0 - 10.5 K/uL 6.7 6.7 10.3  Hemoglobin 13.0 - 17.0 g/dL 10.6(L) 11.1(L) 10.9(L)  Hematocrit 39.0 - 52.0 % 31.9(L) 33.6(L) 34.0(L)  Platelets 150.0 - 400.0 K/uL 106.0(L) 142.0(L) 179   Lab Results  Component Value Date   OCCULTBLD Negative 06/26/2018   OCCULTBLD POSITIVE (A) 11/16/2017   OCCULTBLD Negative 06/07/2017      ROS    Physical Exam    BP 140/68 (BP Location: Left Arm, Patient Position: Sitting, Cuff Size: Normal)   Pulse 75   Ht _0  (1.727 m)   Wt 244 lb (110.7 kg)   SpO2  98%   BMI 37.10 kg/m       ASSESSMENT/ PLAN:   Diabetes type 2 with obesity  See history of present illness for detailed discussion of his current management, blood sugar patterns and problems identified  His A1c now appears to be improving further with result 7.4  However he has more anemia and variable renal function As discussed above his fasting readings are not as low with reducing NPH and stopping Amaryl in the evening Also no worsening of control with stopping Metformin Difficult to assess his postprandial readings as he does not check readings 2 hours after dinner and also not after breakfast Most likely he needs insulin to cover his breakfast also as discussed above because of high readings at least when he is eating oatmeal in the morning Not able to exercise much lately also  Discussed current blood sugar patterns, need to minimize variability in blood sugars and regularly check postprandial readings  Recommendations: To check readings 2 hours or so after dinner and if they are over 182 increase his NovoLog by 2 to 4 units at the mealtime We will start taking 4 units of NovoLog in the morning when eating oatmeal at least and then verify the 2-hour postprandial reading REDUCE NPH to 20 units at bedtime Call if having low sugars overnight again He will try to keep portions of carbohydrate  snack smaller in the evening as blood sugars can be as high as 275 late evening He can ride the exercise bike or walk much as tolerated   HYPERTENSION: Blood pressure controlled with Benicar 20 mg Followed by cardiologist and also periodically checking home  RENAL dysfunction: This is relatively better  However he thinks he is rapidly gaining weight and has messaged his cardiologist especially with his high BNP  Amiodarone therapy: Previously normal TSH   ANEMIA: He still is iron deficient despite taking iron tablets twice a day This needs to be evaluated and followed by PCP and  needs to call for an appointment He will go ahead and take his stool Hemoccult kit today  He will try to get his Covid vaccine when available to him and may need to go through his PCP  Total visit time for evaluation and management of multiple problems and counseling =25 minutes      Patient Instructions  Call Dr Raoul Pitch re: anemia  May take 4 Units for oatmeal Humalog   Check blood sugars on waking up 3 days a week  Also check blood sugars about 2 hours after meals and do this after different meals by rotation  Recommended blood sugar levels on waking up are 90-130 and about 2 hours after meal is 130-180  Please bring your blood sugar monitor to each visit, thank you  Novolin N 20 units    [name] 09/09/2019, 6:36 PM   Patient Instructions  Call Dr Raoul Pitch re: anemia  May take 4 Units for oatmeal Humalog   Check blood sugars on waking up 3 days a week  Also check blood sugars about 2 hours after meals and do this after different meals by rotation  Recommended blood sugar levels on waking up are 90-130 and about 2 hours after meal is 130-180  Please bring your blood sugar monitor to each visit, thank you  Novolin N 20 units

## 2019-09-06 NOTE — Telephone Encounter (Signed)
  Pt c/o swelling: STAT is pt has developed SOB within 24 hours  1) How much weight have you gained and in what time span? 6lbs in the last few weeks  2) If swelling, where is the swelling located? ankles  3) Are you currently taking a fluid pill? yes  4) Are you currently SOB? no  5) Do you have a log of your daily weights (if so, list)? no  6) Have you gained 3 pounds in a day or 5 pounds in a week? no  7) Have you traveled recently? No  Patient's wife calling with questions on what they should do since the patient's Lasix was decreased he is now gaining weight again. She says he has gained 6lbs in the last few weeks.

## 2019-09-09 NOTE — Telephone Encounter (Signed)
Let him know, he will need at least 80 mg lasix AM am and 40 mg PM to prevent fluid build up and elevated BNP. Would also recommend metolazone 5 mg on Mondays and Thursdays. BMET in 7-10 days

## 2019-09-10 MED ORDER — METOLAZONE 5 MG PO TABS: ORAL_TABLET | ORAL | 1 refills | Status: DC

## 2019-09-10 MED ORDER — FUROSEMIDE 40 MG PO TABS: ORAL_TABLET | ORAL | 3 refills | Status: DC

## 2019-09-10 NOTE — Telephone Encounter (Signed)
Spoke with pt's wife and went over recommendations. Scheduled pt for labs on 09/18/2019.  Wife verbalized understanding and was in agreement with plan.

## 2019-09-11 ENCOUNTER — Other Ambulatory Visit: Payer: Self-pay | Admitting: Endocrinology

## 2019-09-11 DIAGNOSIS — Z1211 Encounter for screening for malignant neoplasm of colon: Secondary | ICD-10-CM

## 2019-09-11 DIAGNOSIS — D508 Other iron deficiency anemias: Secondary | ICD-10-CM

## 2019-09-13 ENCOUNTER — Ambulatory Visit (HOSPITAL_COMMUNITY): Payer: Medicare Other | Attending: Cardiology

## 2019-09-13 ENCOUNTER — Other Ambulatory Visit: Payer: Self-pay

## 2019-09-13 DIAGNOSIS — I5033 Acute on chronic diastolic (congestive) heart failure: Secondary | ICD-10-CM | POA: Diagnosis present

## 2019-09-13 MED ORDER — PERFLUTREN LIPID MICROSPHERE
1.0000 mL | INTRAVENOUS | Status: AC | PRN
  Administered 2019-09-13: 2 mL via INTRAVENOUS

## 2019-09-14 ENCOUNTER — Other Ambulatory Visit: Payer: Self-pay | Admitting: Endocrinology

## 2019-09-18 ENCOUNTER — Other Ambulatory Visit: Payer: Medicare Other

## 2019-09-18 ENCOUNTER — Ambulatory Visit: Payer: Medicare Other | Attending: Internal Medicine

## 2019-09-18 ENCOUNTER — Other Ambulatory Visit: Payer: Self-pay

## 2019-09-18 DIAGNOSIS — I5033 Acute on chronic diastolic (congestive) heart failure: Secondary | ICD-10-CM

## 2019-09-18 DIAGNOSIS — Z23 Encounter for immunization: Secondary | ICD-10-CM

## 2019-09-18 LAB — BASIC METABOLIC PANEL
BUN/Creatinine Ratio: 31 — ABNORMAL HIGH (ref 10–24)
BUN: 55 mg/dL — ABNORMAL HIGH (ref 8–27)
CO2: 20 mmol/L (ref 20–29)
Calcium: 9.1 mg/dL (ref 8.6–10.2)
Chloride: 97 mmol/L (ref 96–106)
Creatinine, Ser: 1.79 mg/dL — ABNORMAL HIGH (ref 0.76–1.27)
GFR calc Af Amer: 40 mL/min/{1.73_m2} — ABNORMAL LOW (ref >59)
GFR calc non Af Amer: 34 mL/min/{1.73_m2} — ABNORMAL LOW (ref >59)
Glucose: 146 mg/dL — ABNORMAL HIGH (ref 65–99)
Potassium: 4.7 mmol/L (ref 3.5–5.2)
Sodium: 134 mmol/L (ref 134–144)

## 2019-09-18 NOTE — Progress Notes (Signed)
   Covid-19 Vaccination Clinic  Name:  Todd Romero    MRN: [id number] DOB: 03/04/1934  09/18/2019  Todd Romero was observed post Covid-19 immunization for 30 minutes based on pre-vaccination screening without incidence. He was provided with Vaccine Information Sheet and instruction to access the V-Safe system.   Todd Romero was instructed to call 911 with any severe reactions post vaccine: [name] Difficulty breathing  . Swelling of your face and throat  . A fast heartbeat  . A bad rash all over your body  . Dizziness and weakness    Immunizations Administered    Name Date Dose VIS Date Route   Pfizer COVID-19 Vaccine 09/18/2019  8:49 AM 0.3 mL 08/17/2019 Intramuscular   Manufacturer: [place]   Lot: [id number]   [place]: [id number]

## 2019-09-24 ENCOUNTER — Encounter (HOSPITAL_COMMUNITY): Payer: Self-pay | Admitting: Physician Assistant

## 2019-09-24 ENCOUNTER — Ambulatory Visit (HOSPITAL_COMMUNITY)
Admission: RE | Admit: 2019-09-24 | Discharge: 2019-09-24 | Disposition: A | Discharge status: Home / Self Care | Payer: Medicare Other | Source: Ambulatory Visit | Attending: Physician Assistant | Admitting: Physician Assistant

## 2019-09-24 ENCOUNTER — Other Ambulatory Visit: Payer: Self-pay

## 2019-09-24 VITALS — BP 126/60 | HR 63 | Ht 68.0 in | Wt 232.8 lb

## 2019-09-24 DIAGNOSIS — E119 Type 2 diabetes mellitus without complications: Secondary | ICD-10-CM | POA: Diagnosis not present

## 2019-09-24 DIAGNOSIS — I082 Rheumatic disorders of both aortic and tricuspid valves: Secondary | ICD-10-CM | POA: Insufficient documentation

## 2019-09-24 DIAGNOSIS — Z886 Allergy status to analgesic agent status: Secondary | ICD-10-CM | POA: Insufficient documentation

## 2019-09-24 DIAGNOSIS — Z8249 Family history of ischemic heart disease and other diseases of the circulatory system: Secondary | ICD-10-CM | POA: Diagnosis not present

## 2019-09-24 DIAGNOSIS — E7800 Pure hypercholesterolemia, unspecified: Secondary | ICD-10-CM | POA: Diagnosis not present

## 2019-09-24 DIAGNOSIS — I5042 Chronic combined systolic (congestive) and diastolic (congestive) heart failure: Secondary | ICD-10-CM | POA: Insufficient documentation

## 2019-09-24 DIAGNOSIS — Z882 Allergy status to sulfonamides status: Secondary | ICD-10-CM | POA: Diagnosis not present

## 2019-09-24 DIAGNOSIS — E669 Obesity, unspecified: Secondary | ICD-10-CM | POA: Diagnosis not present

## 2019-09-24 DIAGNOSIS — I4819 Other persistent atrial fibrillation: Secondary | ICD-10-CM

## 2019-09-24 DIAGNOSIS — Z952 Presence of prosthetic heart valve: Secondary | ICD-10-CM | POA: Diagnosis not present

## 2019-09-24 DIAGNOSIS — I4510 Unspecified right bundle-branch block: Secondary | ICD-10-CM | POA: Diagnosis not present

## 2019-09-24 DIAGNOSIS — Z809 Family history of malignant neoplasm, unspecified: Secondary | ICD-10-CM | POA: Insufficient documentation

## 2019-09-24 DIAGNOSIS — D649 Anemia, unspecified: Secondary | ICD-10-CM | POA: Insufficient documentation

## 2019-09-24 DIAGNOSIS — D6869 Other thrombophilia: Secondary | ICD-10-CM | POA: Diagnosis not present

## 2019-09-24 DIAGNOSIS — E785 Hyperlipidemia, unspecified: Secondary | ICD-10-CM | POA: Insufficient documentation

## 2019-09-24 DIAGNOSIS — M1990 Unspecified osteoarthritis, unspecified site: Secondary | ICD-10-CM | POA: Diagnosis not present

## 2019-09-24 DIAGNOSIS — Z7901 Long term (current) use of anticoagulants: Secondary | ICD-10-CM | POA: Diagnosis not present

## 2019-09-24 DIAGNOSIS — Z96651 Presence of right artificial knee joint: Secondary | ICD-10-CM | POA: Insufficient documentation

## 2019-09-24 DIAGNOSIS — Z9049 Acquired absence of other specified parts of digestive tract: Secondary | ICD-10-CM | POA: Insufficient documentation

## 2019-09-24 DIAGNOSIS — I110 Hypertensive heart disease with heart failure: Secondary | ICD-10-CM | POA: Diagnosis not present

## 2019-09-24 DIAGNOSIS — Z794 Long term (current) use of insulin: Secondary | ICD-10-CM | POA: Insufficient documentation

## 2019-09-24 DIAGNOSIS — I2510 Atherosclerotic heart disease of native coronary artery without angina pectoris: Secondary | ICD-10-CM | POA: Diagnosis not present

## 2019-09-24 DIAGNOSIS — Z6835 Body mass index (BMI) 35.0-35.9, adult: Secondary | ICD-10-CM | POA: Insufficient documentation

## 2019-09-24 DIAGNOSIS — Z79899 Other long term (current) drug therapy: Secondary | ICD-10-CM | POA: Insufficient documentation

## 2019-09-24 DIAGNOSIS — G4733 Obstructive sleep apnea (adult) (pediatric): Secondary | ICD-10-CM | POA: Insufficient documentation

## 2019-09-24 DIAGNOSIS — R9431 Abnormal electrocardiogram [ECG] [EKG]: Secondary | ICD-10-CM | POA: Insufficient documentation

## 2019-09-24 NOTE — Progress Notes (Signed)
Primary Care Physician: Ma Hillock, DO Primary Cardiologist: Dr Tamala Julian Primary Electrophysiologist: Dr Rayann Heman Referring Physician: Dr Rosilyn Mings Todd Romero is a 86 y.o. male with a history of persistent atrial fibrillation who presents for follow up in the [facility]. Patient now s/p DCCV 01/18/19. He reports that he has done well since his last visit. He denies any heart racing or palpitations. He did have some increased fluid retention but does admit to dietary indiscretions during the holidays. He states he is back to his dry weight now. He is on Eliquis for a CHADS2VASC score of 6.  Today, he denies symptoms of palpitations, chest pain, dizziness, presyncope, syncope, snoring, daytime somnolence, bleeding, or neurologic sequela. The patient is tolerating medications without difficulties and is otherwise without complaint today.    Atrial Fibrillation Risk Factors:  he does have symptoms or diagnosis of sleep apnea. he is not compliant with CPAP therapy.  he has a BMI of Body mass index is 35.4 kg/m.[name] Filed Weights   09/24/19 1006  Weight: 105.6 kg    Family History  Problem Relation Age of Onset  . Heart failure Father   . Emphysema Father   . Hypertension Mother   . Cancer Sister   . Healthy Sister      Atrial Fibrillation Management history:  Previous antiarrhythmic drugs: amiodarone Previous cardioversions: 03/2018, 01/18/19 Previous ablations: none CHADS2VASC score: 6 Anticoagulation history: Eliquis   Past Medical History:  Diagnosis Date  . Anemia   . Arthritis   . CAD (coronary artery disease)    a. cardiac cath 12/2016 showing moderate AS, elevated LVEDP, heavy 3V coronary calcification, 100% mD2, 30-50% LAD, 50-90% stenosis of Cx proximal to origin of L-PDA, RCA not engaged due to poor catheter control (difficult procedure) - coronary status was essentially unchanged from prior.  . Chronic diastolic CHF (congestive  heart failure) ([place])   . Degenerative arthritis   . Diabetes ([place])   . Dyspnea   . Essential hypertension   . Hyperlipidemia   . New onset a-fib ([place]) 03/04/2018  . Obesity (BMI 30-39.9) 06/18/2015  . OSA (obstructive sleep apnea)    Severe with AHI 27/hr now on CPAP  not compliant with treatment.  . Peritonitis ([place]) 1985?  [name] Pneumonia    "6 months - 86 years old"  . Pure hypercholesterolemia   . RBBB   . [facility] spotted fever   . 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 Severe aortic stenosis    a. s/p TAVR 03/2017.  . Type II or unspecified type diabetes mellitus without mention of complication, not stated as uncontrolled    Past Surgical History:  Procedure Laterality Date  . APPENDECTOMY    . CARDIOVERSION N/A 04/03/2018   Procedure: CARDIOVERSION;  Surgeon: Josue Hector, MD;  Location: [facility] ENDOSCOPY;  Service: Cardiovascular;  Laterality: N/A;  . CARDIOVERSION N/A 01/18/2019   Procedure: CARDIOVERSION;  Surgeon: Skeet Latch, MD;  Location: [facility];  Service: Cardiovascular;  Laterality: N/A;  . CARPAL TUNNEL RELEASE    . COLECTOMY     partial  . COLONOSCOPY WITH PROPOFOL N/A 11/18/2017   Procedure: COLONOSCOPY WITH PROPOFOL;  Surgeon: Carol Ada, MD;  Location: [facility] ENDOSCOPY;  Service: Endoscopy;  Laterality: N/A;  . ESOPHAGOGASTRODUODENOSCOPY N/A 11/18/2017   Procedure: ESOPHAGOGASTRODUODENOSCOPY (EGD);  Surgeon: Carol Ada, MD;  Location: [facility]  ENDOSCOPY;  Service: Endoscopy;  Laterality: N/A;  . EYE SURGERY Bilateral    cataracts removed, lens placed  . JOINT REPLACEMENT    . KNEE CARTILAGE SURGERY    . PARTIAL COLECTOMY    . RIGHT/LEFT HEART CATH AND CORONARY ANGIOGRAPHY N/A 12/28/2016   Procedure: Right/Left Heart Cath and Coronary Angiography;  Surgeon: Belva Crome, MD;  Location: [facility] CV LAB;  Service:  Cardiovascular;  Laterality: N/A;  . TEE WITHOUT CARDIOVERSION N/A 03/15/2017   Procedure: TRANSESOPHAGEAL ECHOCARDIOGRAM (TEE);  Surgeon: Burnell Blanks, MD;  Location: [facility];  Service: Open Heart Surgery;  Laterality: N/A;  . TOTAL KNEE ARTHROPLASTY Right 11/08/2017  . TOTAL KNEE ARTHROPLASTY Right 11/08/2017   Procedure: TOTAL KNEE ARTHROPLASTY;  Surgeon: Melrose Nakayama, MD;  Location: [facility];  Service: Orthopedics;  Laterality: Right;  . TRANSCATHETER AORTIC VALVE REPLACEMENT, TRANSFEMORAL N/A 03/15/2017   Procedure: TRANSCATHETER AORTIC VALVE REPLACEMENT, TRANSFEMORAL;  Surgeon: Burnell Blanks, MD;  Location: [facility];  Service: Open Heart Surgery;  Laterality: N/A;  . VEIN SURGERY      Current Outpatient Medications  Medication Sig Dispense Refill  . ACCU-CHEK AVIVA PLUS test strip USE AS INSTRUCTED TO CHECK BLOOD SUGAR TWICE A DAY DX:E11.65 100 strip 2  . acetaminophen (TYLENOL) 500 MG tablet Take 500 mg by mouth every 4 (four) hours as needed for moderate pain or headache.    [name] amiodarone (PACERONE) 200 MG tablet Take 0.5 tablets (100 mg total) by mouth daily. 45 tablet 3  . apixaban (ELIQUIS) 2.5 MG TABS tablet Take 1 tablet (2.5 mg total) by mouth 2 (two) times daily. 60 tablet 11  . BD INSULIN SYRINGE U/F 31G X 5/16" 1 ML MISC USE TO INJECT INSULIN DAILY 100 each 3  . ferrous sulfate 325 (65 FE) MG tablet Take 325 mg by mouth 2 (two) times daily with a meal.     . fish oil-omega-3 fatty acids 1000 MG capsule Take 1 g by mouth daily.     . furosemide (LASIX) 40 MG tablet Take two tablets (80mg  total) by mouth every morning.  Take one tablet by mouth every afternoon. 270 tablet 3  . glimepiride (AMARYL) 2 MG tablet TAKE 1 TABLET before breakfast daily 90 tablet 3  . insulin lispro (HUMALOG KWIKPEN) 100 UNIT/ML KwikPen Inject 12 Units into the skin daily. Inject 12 units under the skin before the main meal of the day.    . insulin NPH Human (HUMULIN N) 100 UNIT/ML injection  Inject 26 units of Humulin N under the skin once daily at bedtime. (Patient taking differently: Inject 22 Units into the skin at bedtime. Inject 22 units of Humulin N under the skin once daily at bedtime.) 30 mL 1  . Insulin Pen Needle (B-Todd UF III MINI PEN NEEDLES) 31G X 5 MM MISC USE 2 PEN NEEDLE PER DAY WITH HUMALOG AND VICTOZA 200 each 3  . metolazone (ZAROXOLYN) 5 MG tablet Taken on tablet by mouth daily on Monday and Thursday.  Take 30 minutes prior to Furosemide. 30 tablet 1  . Multiple Vitamin (MULTIVITAMIN WITH MINERALS) TABS tablet Take 1 tablet by mouth daily. Mens One a day Vit    . Multiple Vitamins-Minerals (PRESERVISION AREDS 2 PO) Take 1 capsule by mouth 2 (two) times daily.    [name] olmesartan (BENICAR) 20 MG tablet TAKE 1 TABLET BY MOUTH EVERY DAY 90 tablet 3  . pantoprazole (PROTONIX) 40 MG tablet Take 1 tablet (40 mg total) by mouth 2 (two)  times daily. 180 tablet 3  . pravastatin (PRAVACHOL) 80 MG tablet Take 1 tablet (80 mg total) by mouth every evening. 90 tablet 1  . tamsulosin (FLOMAX) 0.4 MG CAPS capsule Take 1 capsule daily 90 capsule 1  . VICTOZA 18 MG/3ML SOPN INJECT 1.8 MG UNDER THE SKIN AT BEDTIME 27 mL 3  . vitamin C (ASCORBIC ACID) 500 MG tablet Take 500 mg by mouth daily.     No current facility-administered medications for this encounter.    Allergies  Allergen Reactions  . Bactrim [Sulfamethoxazole-Trimethoprim] Nausea And Vomiting and Other (See Comments)    Bleeding and ulcers  . Morphine And Related Nausea And Vomiting    Social History   Socioeconomic History  . Marital status: Married    Spouse name: Not on file  . Number of children: 4  . Years of education: Not on file  . Highest education level: Not on file  Occupational History  . Occupation: Construction-Retired  Tobacco Use  . Smoking status: Never Smoker  . Smokeless tobacco: Never Used  Substance and Sexual Activity  . Alcohol use: No  . Drug use: No  . Sexual activity: Not on file   Other Topics Concern  . Not on file  Social History Narrative  . Not on file   Social Determinants of Health   Financial Resource Strain:   . Difficulty of Paying Living Expenses: Not on file  Food Insecurity:   . Worried About Charity fundraiser in the Last Year: Not on file  . Ran Out of Food in the Last Year: Not on file  Transportation Needs:   . Lack of Transportation (Medical): Not on file  . Lack of Transportation (Non-Medical): Not on file  Physical Activity:   . Days of Exercise per Week: Not on file  . Minutes of Exercise per Session: Not on file  Stress:   . Feeling of Stress : Not on file  Social Connections:   . Frequency of Communication with Friends and Family: Not on file  . Frequency of Social Gatherings with Friends and Family: Not on file  . Attends Religious Services: Not on file  . Active Member of Clubs or Organizations: Not on file  . Attends Archivist Meetings: Not on file  . Marital Status: Not on file  Intimate Partner Violence:   . Fear of Current or Ex-Partner: Not on file  . Emotionally Abused: Not on file  . Physically Abused: Not on file  . Sexually Abused: Not on file     ROS- All systems are reviewed and negative except as per the HPI above.  Physical Exam: Vitals:   09/24/19 1006  BP: 126/60  Pulse: 63  Weight: 105.6 kg  Height: 5\' 8"  (1.727 m)    GEN- The patient is well appearing elderly obese male, alert and oriented x 3 today.   HEENT-head normocephalic, atraumatic, sclera clear, conjunctiva pink, hearing intact, trachea midline. Lungs- Clear to ausculation bilaterally, normal work of breathing Heart- Regular rate and rhythm, no murmurs, rubs or gallops  GI- soft, NT, ND, + BS Extremities- no clubbing, cyanosis. Trace bilateral edema MS- no significant deformity or atrophy Skin- no rash or lesion Psych- euthymic mood, full affect Neuro- strength and sensation are intact   Wt Readings from Last 3 Encounters:   09/24/19 105.6 kg  09/06/19 110.7 kg  07/06/19 106 kg    EKG today demonstrates SR HR 63, 1st degree AV block, RBBB, QRS 148, QTc  470   Echo 02/06/18 demonstrated  - Left ventricle: The cavity size was normal. Wall thickness was   increased in a pattern of moderate LVH. The estimated ejection   fraction was 43%. Diffuse hypokinesis. The study is not   technically sufficient to allow evaluation of LV diastolic   function. Ejection fraction (MOD, 2-plane): 43%. - Aortic valve: s/p TAVR valve. No obstruction. Trivial   paravalvular leak. Mean gradient (S): 10 mm Hg. Peak gradient   (S): 24 mm Hg. Valve area (VTI): 1.18 cm^2. Valve area (Vmax):   1.09 cm^2. Valve area (Vmean): 1.2 cm^2. - Left atrium: The atrium was mildly dilated. - Right ventricle: The cavity size was mildly dilated. - Atrial septum: Small atrial septal defect. - Tricuspid valve: There was moderate regurgitation. - Pulmonary arteries: PA peak pressure: 47 mm Hg (S). - Inferior vena cava: The vessel was dilated. The respirophasic   diameter changes were blunted (< 50%), consistent with elevated   central venous pressure.  Epic records are reviewed at length today  Assessment and Plan:  1. Persistent atrial fibrillation S/p DCCV on 01/18/19. Patient appears to be maintaining SR. Continue amiodarone to 100 mg daily Continue Eliquis 2.5 mg BID  Lifestyle changes as below.  This patients CHA2DS2-VASc Score and unadjusted Ischemic Stroke Rate (% per year) is equal to 9.7 % stroke rate/year from a score of 6  Above score calculated as 1 point each if present [CHF, HTN, DM, Vascular=MI/PAD/Aortic Plaque, Age if 65-74, or Male] Above score calculated as 2 points each if present [Age > 75, or Stroke/TIA/TE]  2. Chronic combined systolic and diastolic CHF No symptoms of fluid overload today. Patient admits to dietary indiscretions over the holidays.  3. Obesity Body mass index is 35.4 kg/m. Lifestyle modification  was discussed and encouraged including regular physical activity and weight reduction.  4. Obstructive sleep apnea The importance of adequate treatment of sleep apnea was discussed today in order to improve our ability to maintain sinus rhythm long term. Followed by Dr Radford Pax.  5. HTN Stable, no changes today.    Follow up with Dr Tamala Julian as scheduled. AF clinic in 6 months.    [facility] [address] [place], [place] [address] [phone] 09/24/2019 12:05 PM

## 2019-09-25 ENCOUNTER — Other Ambulatory Visit: Payer: Self-pay | Admitting: Family Medicine

## 2019-09-25 ENCOUNTER — Encounter: Payer: Self-pay | Admitting: Family Medicine

## 2019-09-25 ENCOUNTER — Ambulatory Visit (INDEPENDENT_AMBULATORY_CARE_PROVIDER_SITE_OTHER): Payer: Medicare Other | Admitting: Family Medicine

## 2019-09-25 VITALS — BP 126/64 | HR 61 | Temp 98.5°F | Resp 17 | Ht 67.0 in | Wt 233.0 lb

## 2019-09-25 DIAGNOSIS — D509 Iron deficiency anemia, unspecified: Secondary | ICD-10-CM

## 2019-09-25 DIAGNOSIS — E7849 Other hyperlipidemia: Secondary | ICD-10-CM

## 2019-09-25 DIAGNOSIS — I350 Nonrheumatic aortic (valve) stenosis: Secondary | ICD-10-CM | POA: Diagnosis not present

## 2019-09-25 DIAGNOSIS — Z794 Long term (current) use of insulin: Secondary | ICD-10-CM

## 2019-09-25 DIAGNOSIS — N1832 Chronic kidney disease, stage 3b: Secondary | ICD-10-CM

## 2019-09-25 DIAGNOSIS — I25119 Atherosclerotic heart disease of native coronary artery with unspecified angina pectoris: Secondary | ICD-10-CM

## 2019-09-25 DIAGNOSIS — I4819 Other persistent atrial fibrillation: Secondary | ICD-10-CM

## 2019-09-25 DIAGNOSIS — E11319 Type 2 diabetes mellitus with unspecified diabetic retinopathy without macular edema: Secondary | ICD-10-CM

## 2019-09-25 DIAGNOSIS — I4510 Unspecified right bundle-branch block: Secondary | ICD-10-CM

## 2019-09-25 DIAGNOSIS — E669 Obesity, unspecified: Secondary | ICD-10-CM

## 2019-09-25 DIAGNOSIS — I10 Essential (primary) hypertension: Secondary | ICD-10-CM

## 2019-09-25 DIAGNOSIS — N400 Enlarged prostate without lower urinary tract symptoms: Secondary | ICD-10-CM

## 2019-09-25 DIAGNOSIS — Z952 Presence of prosthetic heart valve: Secondary | ICD-10-CM

## 2019-09-25 NOTE — Progress Notes (Signed)
Patient ID: Jakhari Space Dilley, male  DOB: Sep 05, 1934, 86 y.o.   MRN: [id number] Patient Care Team    Relationship Specialty Notifications Start End  Ma Hillock, DO PCP - General Family Medicine  09/13/19   Belva Crome, MD PCP - Cardiology Cardiology  10/21/17   [name], MD Consulting Physician Endocrinology  09/25/19   Thompson Grayer, MD Consulting Physician Cardiology  10/01/19   Danice Goltz, MD Consulting Physician Ophthalmology  10/01/19   Monna Fam, MD Consulting Physician Ophthalmology  10/01/19     Chief Complaint  Patient presents with  . Establish Care    Dr Dwyane Dee was acting as PCP. Dr Dwyane Dee told patient he had low iron and needed PCP. Stool has been tested and he is taking iron pills x2 years.     Subjective:  Braison Snoke Brotz is a 86 y.o.  male present for TOC establishment. All past medical history, surgical history, allergies, family history, immunizations, medications and social history were updated in the electronic medical record today. All recent labs, ED visits and hospitalizations within the last year were reviewed.  Hypertension/hyperlipidemia/chronic diastolic CHF/CAD/aortic stenosis/obesity/right bundle branch block/s/p TAVR/A. fib: Pt reports compliance with amiodarone, Benicar, Zaroxolyn, Lasix, Eliquis which are all prescribed by cardiology.  He follows with Dr. Tamala Julian and Dr. Rayann Heman for his cardiac conditions. Patient denies chest pain, shortness of breath or lower extremity edema.  Pt is  prescribed statin-pravastatin 80 mg which is supplied by PCP.  Patient status post DCCV 01/18/2019.  CHA2DS2-VASc score of 6.  Type II diabetic: Pt reports compliance with glimepiride, Victoza, Humalog/Humulin.. Denies numbness, tingling of extremities, hypo/hyperglycemic events or non-healing wounds.  PNA series: Completed 2019 Flu shot: UTD 2020 (recommneded yearly) Foot exam: Appears to be due.  Endocrine is managing diabetes.  Eye exam:  Appears to be due.  Followed by Dr. Herbert Deaner and Dr. Manuella Ghazi.  Will request additional records. A1c: 7.4 09/03/2019 per EMR-reviewed  Iron deficiency anemia: Patient reports he has been iron deficient for some time.  He states he completed FOBT card and sent back to his endocrinologist (which was acting as his prior PCP) but he has not heard results.  He is taking ferrous sulfate 325 mg twice daily and tolerating well.  Last labs reviewed in the system was with an iron level of 49, percent saturations were mildly decreased in the transferrin was just mildly below normal at 210 (212 normal).  Thrombocytopenia: Mild thrombocytopenia platelets 133-197.  Patient is on blood thinner. BPH: Well-controlled on Flomax 0.5 mg daily.  No PSA available.  Provided by PCP.  CKD 3/4: Viewed labs in EMR August 15, 2019 GFR dropped to 24 from a baseline of approximately 33.  He reports he was having trouble with his fluid at that time an additional diuretics had been given.  His baseline creatinine seems to be about 1.8.  Baseline GFR approximately 33.   Echo 02/06/18 demonstrated  - Left ventricle: The cavity size was normal. Wall thickness was increased in a pattern of moderate LVH. The estimated ejection fraction was 43%. Diffuse hypokinesis. The study is not technically sufficient to allow evaluation of LV diastolic function. Ejection fraction (MOD, 2-plane): 43%. - Aortic valve: s/p TAVR valve. No obstruction. Trivial paravalvular leak. Mean gradient (S): 10 mm Hg. Peak gradient (S): 24 mm Hg. Valve area (VTI): 1.18 cm^2. Valve area (Vmax): 1.09 cm^2. Valve area (Vmean): 1.2 cm^2. - Left atrium: The atrium was mildly dilated. - Right ventricle: The  cavity size was mildly dilated. - Atrial septum: Small atrial septal defect. - Tricuspid valve: There was moderate regurgitation. - Pulmonary arteries: PA peak pressure: 47 mm Hg (S). - Inferior vena cava: The vessel was dilated. The  respirophasic diameter changes were blunted (<50%), consistent with elevated central venous pressure.  Depression screen [facility] 2/9 05/06/2017 01/28/2016  Decreased Interest 0 0  Down, Depressed, Hopeless 0 0  PHQ - 2 Score 0 0  Some recent data might be hidden   No flowsheet data found.     Fall Risk  05/06/2017 01/28/2016 03/18/2014  Falls in the past year? No No No   Immunization History  Administered Date(s) Administered  . Fluad Quad(high Dose 65+) 07/06/2019  . Influenza, High Dose Seasonal PF 07/05/2017, 06/09/2018  . Influenza,inj,Quad PF,6+ Mos 06/18/2014, 05/29/2015  . Influenza-Unspecified 05/26/2016  . PFIZER SARS-COV-2 Vaccination 09/18/2019  . Pneumococcal Conjugate-13 03/18/2014  . Pneumococcal Polysaccharide-23 03/20/2005, 08/16/2018  . Tdap 11/27/2015  . Zoster 03/27/2012   No exam data present  Past Medical History:  Diagnosis Date  . Anemia   . Arthritis   . CAD (coronary artery disease)    a. cardiac cath 12/2016 showing moderate AS, elevated LVEDP, heavy 3V coronary calcification, 100% mD2, 30-50% LAD, 50-90% stenosis of Cx proximal to origin of L-PDA, RCA not engaged due to poor catheter control (difficult procedure) - coronary status was essentially unchanged from prior.  . Chicken pox   . Chronic diastolic CHF (congestive heart failure) ([place])   . Degenerative arthritis   . Diabetes ([place])   . Dyspnea   . Essential hypertension   . Heart disease   . Heme positive stool 11/16/2017  . Hyperlipidemia   . New onset a-fib ([place]) 03/04/2018  . Obesity (BMI 30-39.9) 06/18/2015  . OSA (obstructive sleep apnea)    Severe with AHI 27/hr now on CPAP  not compliant with treatment.  . Peritonitis ([place]) 1985?  [name] Pneumonia    "6 months - 86 years old"  . Pure hypercholesterolemia   . RBBB   . [facility] spotted fever   . 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 Severe aortic stenosis    a. s/p TAVR 03/2017.  . Type II or unspecified type diabetes mellitus without mention of complication, not stated as uncontrolled    Allergies  Allergen Reactions  . Bactrim [Sulfamethoxazole-Trimethoprim] Nausea And Vomiting and Other (See Comments)    Bleeding and ulcers  . Morphine And Related Nausea And Vomiting   Past Surgical History:  Procedure Laterality Date  . APPENDECTOMY    . CARDIOVERSION N/A 04/03/2018   Procedure: CARDIOVERSION;  Surgeon: Josue Hector, MD;  Location: [facility] ENDOSCOPY;  Service: Cardiovascular;  Laterality: N/A;  . CARDIOVERSION N/A 01/18/2019   Procedure: CARDIOVERSION;  Surgeon: Skeet Latch, MD;  Location: [place];  Service: Cardiovascular;  Laterality: N/A;  . CARPAL TUNNEL RELEASE    . COLONOSCOPY WITH PROPOFOL N/A 11/18/2017   Procedure: COLONOSCOPY WITH PROPOFOL;  Surgeon: Carol Ada, MD;  Location: [facility] ENDOSCOPY;  Service: Endoscopy;  Laterality: N/A;  . ESOPHAGOGASTRODUODENOSCOPY N/A 11/18/2017   Procedure: ESOPHAGOGASTRODUODENOSCOPY (EGD);  Surgeon: Carol Ada, MD;  Location: [facility] ENDOSCOPY;  Service: Endoscopy;  Laterality: N/A;  . EYE SURGERY Bilateral    cataracts removed, lens placed  . JOINT REPLACEMENT    . KNEE CARTILAGE SURGERY    . PARTIAL COLECTOMY    .  RIGHT/LEFT HEART CATH AND CORONARY ANGIOGRAPHY N/A 12/28/2016   Procedure: Right/Left Heart Cath and Coronary Angiography;  Surgeon: Belva Crome, MD;  Location: [place] CV LAB;  Service: Cardiovascular;  Laterality: N/A;  . TEE WITHOUT CARDIOVERSION N/A 03/15/2017   Procedure: TRANSESOPHAGEAL ECHOCARDIOGRAM (TEE);  Surgeon: Burnell Blanks, MD;  Location: [place];  Service: Open Heart Surgery;  Laterality: N/A;  . TOTAL KNEE ARTHROPLASTY Right 11/08/2017  . TOTAL KNEE ARTHROPLASTY Right 11/08/2017   Procedure: TOTAL KNEE ARTHROPLASTY;  Surgeon: Melrose Nakayama, MD;  Location: [place];  Service: Orthopedics;   Laterality: Right;  . TRANSCATHETER AORTIC VALVE REPLACEMENT, TRANSFEMORAL N/A 03/15/2017   Procedure: TRANSCATHETER AORTIC VALVE REPLACEMENT, TRANSFEMORAL;  Surgeon: Burnell Blanks, MD;  Location: [place];  Service: Open Heart Surgery;  Laterality: N/A;  . VEIN SURGERY     Family History  Problem Relation Age of Onset  . Heart failure Father   . Emphysema Father   . COPD Father   . Early death Father   . Heart attack Father   . Hypertension Mother   . Early death Mother   . Hyperlipidemia Mother   . Stroke Mother   . Cancer Sister   . Hypertension Sister   . Healthy Sister    Social History   Social History Narrative  . Not on file    Allergies as of 09/25/2019      Reactions   Bactrim [sulfamethoxazole-trimethoprim] Nausea And Vomiting, Other (See Comments)   Bleeding and ulcers   Morphine And Related Nausea And Vomiting      Medication List       Accurate as of September 25, 2019 11:59 PM. If you have any questions, ask your nurse or doctor.        Accu-Chek Aviva Plus test strip Generic drug: glucose blood USE AS INSTRUCTED TO CHECK BLOOD SUGAR TWICE A DAY DX:E11.65   acetaminophen 500 MG tablet Commonly known as: TYLENOL Take 500 mg by mouth every 4 (four) hours as needed for moderate pain or headache.   amiodarone 200 MG tablet Commonly known as: PACERONE Take 0.5 tablets (100 mg total) by mouth daily.   apixaban 2.5 MG Tabs tablet Commonly known as: Eliquis Take 1 tablet (2.5 mg total) by mouth 2 (two) times daily.   B-D UF III MINI PEN NEEDLES 31G X 5 MM Misc Generic drug: Insulin Pen Needle USE 2 PEN NEEDLE PER DAY WITH HUMALOG AND VICTOZA   BD Insulin Syringe U/F 31G X 5/16" 1 ML Misc Generic drug: Insulin Syringe-Needle U-100 USE TO INJECT INSULIN DAILY   ferrous sulfate 325 (65 FE) MG tablet Take 325 mg by mouth 2 (two) times daily with a meal.   fish oil-omega-3 fatty acids 1000 MG capsule Take 1 g by mouth daily.   furosemide 40  MG tablet Commonly known as: LASIX Take two tablets (80mg  total) by mouth every morning.  Take one tablet by mouth every afternoon.   glimepiride 2 MG tablet Commonly known as: AMARYL TAKE 1 TABLET before breakfast daily   HumaLOG KwikPen 100 UNIT/ML KwikPen Generic drug: insulin lispro Inject 12 Units into the skin daily. Inject 12 units under the skin before the main meal of the day.   insulin NPH Human 100 UNIT/ML injection Commonly known as: HumuLIN N Inject 26 units of Humulin N under the skin once daily at bedtime. What changed:   how much to take  how to take this  when to take this  additional  instructions   metolazone 5 MG tablet Commonly known as: ZAROXOLYN Taken on tablet by mouth daily on Monday and Thursday.  Take 30 minutes prior to Furosemide.   multivitamin with minerals Tabs tablet Take 1 tablet by mouth daily. Mens One a day Vit   olmesartan 20 MG tablet Commonly known as: BENICAR TAKE 1 TABLET BY MOUTH EVERY DAY   pantoprazole 40 MG tablet Commonly known as: PROTONIX Take 1 tablet (40 mg total) by mouth 2 (two) times daily.   pravastatin 80 MG tablet Commonly known as: PRAVACHOL Take 1 tablet (80 mg total) by mouth every evening.   PRESERVISION AREDS 2 PO Take 1 capsule by mouth 2 (two) times daily.   tamsulosin 0.4 MG Caps capsule Commonly known as: FLOMAX Take 1 capsule daily   Victoza 18 MG/3ML Sopn Generic drug: liraglutide INJECT 1.8 MG UNDER THE SKIN AT BEDTIME   vitamin C 500 MG tablet Commonly known as: ASCORBIC ACID Take 500 mg by mouth daily.       All past medical history, surgical history, allergies, family history, immunizations andmedications were updated in the EMR today and reviewed under the history and medication portions of their EMR.      ROS: 14 pt review of systems performed and negative (unless mentioned in an HPI)  Objective: BP 126/64 (BP Location: Left Arm, Patient Position: Sitting, Cuff Size: Normal)    Pulse 61   Temp 98.5 F (36.9 C) (Temporal)   Resp 17   Ht 5\' 7"  (1.702 m)   Wt 233 lb (105.7 kg)   SpO2 97%   BMI 36.49 kg/m  Gen: Afebrile. No acute distress. Nontoxic in appearance, well-developed, well-nourished, very pleasant Caucasian male.   HENT: AT. [place].  Eyes:Pupils Equal Round Reactive to light, Extraocular movements intact,  Conjunctiva without redness, discharge or icterus. Neck/lymp/endocrine: Supple, no thyromegaly CV: RRR no murmurs, no edema, +2/4 P posterior tibialis pulses.  Chest: CTAB, no wheeze, rhonchi or crackles.  Normal respiratory effort.  Good air movement. Abd: Soft.  BS present.  Skin: Warm and well-perfused. Skin intact. Neuro/Msk:  Normal gait. PERLA. EOMi. Alert. Oriented x3.   Psych: Normal affect, dress and demeanor. Normal speech. Normal thought content and judgment.   Assessment/plan: Baltasar Twilley Breeding is a 86 y.o. male present for est care Iron deficiency anemia, unspecified iron deficiency anemia type Currently supplementing with ferrous sulfate 325 mg twice daily.  Will recollect panel today and guide him further. - Iron, TIBC and Ferritin Panel  Stage 3b chronic kidney disease PTH/calcium/vitamin D every 6 months. Renal panel every 3 months. Avoid NSAIDs. Renally dose medications as appropriate. - Vitamin D (25 hydroxy) - PTH, Intact and Calcium  Thrombocytopenia/long-term anticoagulation: Monitor platelets closely.  Patient on long-term anticoagulation and mildly decreased platelets. CBC collected today  Hypertension/hyperlipidemia/chronic diastolic CHF/CAD/aortic stenosis/obesity/right bundle branch block/s/p TAVR/A. fib Stable. Blood pressure medications managed by cardiology.  Records reviewed. Continue routine follow-ups with Dr. Tamala Julian in A. fib clinic/Dr. Allred. Continue pravastatin.  Yearly lipids due June 2021.  Type 2 diabetes mellitus with retinopathy, with long-term current use of insulin, macular edema presence  unspecified, unspecified laterality, unspecified retinopathy severity ([place]) Managed by endocrine.  Records reviewed. Will request most current eye exam.  Encouraged patient routine follow-ups every 6-12 months. If foot exam is not completed by endocrine will complete year.  Benign prostatic hyperplasia, unspecified whether lower urinary tract symptoms present Stable. Continue Flomax 0.4 mg QD   Return in about 3 months (around 12/24/2019). Orders  Placed This Encounter  Procedures  . Iron, TIBC and Ferritin Panel  . Vitamin D (25 hydroxy)  . PTH, Intact and Calcium   Meds ordered this encounter  Medications  . pravastatin (PRAVACHOL) 80 MG tablet    Sig: Take 1 tablet (80 mg total) by mouth every evening.    Dispense:  90 tablet    Refill:  3  . tamsulosin (FLOMAX) 0.4 MG CAPS capsule    Sig: Take 1 capsule daily    Dispense:  90 capsule    Refill:  1     Note is dictated utilizing voice recognition software. Although note has been proof read prior to signing, occasional typographical errors still can be missed. If any questions arise, please do not hesitate to call for verification.  Electronically signed by: Howard Pouch, DO [place]

## 2019-09-25 NOTE — Patient Instructions (Signed)
pleasure meeting you today.  We will call you with lab results and guide you on further evaluation and follow up.  Follow up in 6 months.   Please help [place] help you:  We are honored you have chosen [facility] for your Primary Care home. Below you will find basic instructions that you may need to access in the future. Please help [place] help you by reading the instructions, which cover many of the frequent questions we experience.   Prescription refills and request:  -In order to allow more efficient response time, please call your pharmacy for all refills. They will forward the request electronically to [place]. This allows for the quickest possible response. Request left on a nurse line can take longer to refill, since these are checked as time allows between office patients and other phone calls.  - refill request can take up to 3-5 working days to complete.  - If request is sent electronically and request is appropiate, it is usually completed in 1-2 business days.  - all patients will need to be seen routinely for all chronic medical conditions requiring prescription medications (see follow-up below). If you are overdue for follow up on your condition, you will be asked to make an appointment and we will call in enough medication to cover you until your appointment (up to 30 days).  - all controlled substances will require a face to face visit to request/refill.  - if you desire your prescriptions to go through a new pharmacy, and have an active script at original pharmacy, you will need to call your pharmacy and have scripts transferred to new pharmacy. This is completed between the pharmacy locations and not by your provider.    Results: If any images or labs were ordered, it can take up to 1 week to get results depending on the test ordered and the lab/facility running and resulting the test. - Normal or stable results, which do not need further discussion, may be released to your mychart  immediately with attached note to you. A call may not be generated for normal results. Please make certain to sign up for mychart. If you have questions on how to activate your mychart you can call the front office.  - If your results need further discussion, our office will attempt to contact you via phone, and if unable to reach you after 2 attempts, we will release your abnormal result to your mychart with instructions.  - All results will be automatically released in mychart after 1 week.  - Your provider will provide you with explanation and instruction on all relevant material in your results. Please keep in mind, results and labs may appear confusing or abnormal to the untrained eye, but it does not mean they are actually abnormal for you personally. If you have any questions about your results that are not covered, or you desire more detailed explanation than what was provided, you should make an appointment with your provider to do so.   Our office handles many outgoing and incoming calls daily. If we have not contacted you within 1 week about your results, please check your mychart to see if there is a message first and if not, then contact our office.  In helping with this matter, you help decrease call volume, and therefore allow [place] to be able to respond to patients needs more efficiently.   Acute office visits (sick visit):  An acute visit is intended for a new problem and are scheduled  in shorter time slots to allow schedule openings for patients with new problems. This is the appropriate visit to discuss a new problem. Problems will not be addressed by phone call or Echart message. Appointment is needed if requesting treatment. In order to provide you with excellent quality medical care with proper time for you to explain your problem, have an exam and receive treatment with instructions, these appointments should be limited to one new problem per visit. If you experience a new problem, in  which you desire to be addressed, please make an acute office visit, we save openings on the schedule to accommodate you. Please do not save your new problem for any other type of visit, let us take care of it properly and quickly for you.   Follow up visits:  Depending on your condition(s) your provider will need to see you routinely in order to provide you with quality care and prescribe medication(s). Most chronic conditions (Example: hypertension, Diabetes, depression/anxiety... etc), require visits a couple times a year. Your provider will instruct you on proper follow up for your personal medical conditions and history. Please make certain to make follow up appointments for your condition as instructed. Failing to do so could result in lapse in your medication treatment/refills. If you request a refill, and are overdue to be seen on a condition, we will always provide you with a 30 day script (once) to allow you time to schedule.    Medicare wellness (well visit): - we have a wonderful Nurse Maudie Mercury), that will meet with you and provide you will yearly medicare wellness visits. These visits should occur yearly (can not be scheduled less than 1 calendar year apart) and cover preventive health, immunizations, advance directives and screenings you are entitled to yearly through your medicare benefits. Do not miss out on your entitled benefits, this is when medicare will pay for these benefits to be ordered for you.  These are strongly encouraged by your provider and is the appropriate type of visit to make certain you are up to date with all preventive health benefits. If you have not had your medicare wellness exam in the last 12 months, please make certain to schedule one by calling the office and schedule your medicare wellness with Maudie Mercury as soon as possible.   Yearly physical (well visit):  - Adults are recommended to be seen yearly for physicals. Check with your insurance and date of your last physical,  most insurances require one calendar year between physicals. Physicals include all preventive health topics, screenings, medical exam and labs that are appropriate for gender/age and history. You may have fasting labs needed at this visit. This is a well visit (not a sick visit), new problems should not be covered during this visit (see acute visit).  - Pediatric patients are seen more frequently when they are younger. Your provider will advise you on well child visit timing that is appropriate for your their age. - This is not a medicare wellness visit. Medicare wellness exams do not have an exam portion to the visit. Some medicare companies allow for a physical, some do not allow a yearly physical. If your medicare allows a yearly physical you can schedule the medicare wellness with our nurse Maudie Mercury and have your physical with your provider after, on the same day. Please check with insurance for your full benefits.   Late Policy/No Shows:  - all new patients should arrive 15-30 minutes earlier than appointment to allow [place] time  to  obtain all personal demographics,  insurance information and for you to complete office paperwork. - All established patients should arrive 10-15 minutes earlier than appointment time to update all information and be checked in .  - In our best efforts to run on time, if you are late for your appointment you will be asked to either reschedule or if able, we will work you back into the schedule. There will be a wait time to work you back in the schedule,  depending on availability.  - If you are unable to make it to your appointment as scheduled, please call 24 hours ahead of time to allow [place] to fill the time slot with someone else who needs to be seen. If you do not cancel your appointment ahead of time, you may be charged a no show fee.

## 2019-09-26 ENCOUNTER — Telehealth: Payer: Self-pay | Admitting: Family Medicine

## 2019-09-26 ENCOUNTER — Encounter: Payer: Self-pay | Admitting: Family Medicine

## 2019-09-26 LAB — IRON,TIBC AND FERRITIN PANEL
%SAT: 33 % (calc) (ref 20–48)
Ferritin: 119 ng/mL (ref 24–380)
Iron: 100 ug/dL (ref 50–180)
TIBC: 307 mcg/dL (calc) (ref 250–425)

## 2019-09-26 LAB — PTH, INTACT AND CALCIUM
Calcium: 9.5 mg/dL (ref 8.6–10.3)
PTH: 88 pg/mL — ABNORMAL HIGH (ref 14–64)

## 2019-09-26 LAB — VITAMIN D 25 HYDROXY (VIT D DEFICIENCY, FRACTURES): Vit D, 25-Hydroxy: 28 ng/mL — ABNORMAL LOW (ref 30–100)

## 2019-09-26 MED ORDER — TAMSULOSIN HCL 0.4 MG PO CAPS: ORAL_CAPSULE | ORAL | 1 refills | Status: DC

## 2019-09-26 MED ORDER — PRAVASTATIN SODIUM 80 MG PO TABS: 80.0000 mg | ORAL_TABLET | Freq: Every evening | ORAL | 3 refills | Status: DC

## 2019-09-26 NOTE — Telephone Encounter (Signed)
Please call patient: His iron levels and his iron stores are all in normal range now.  Continue the iron supplement as he has been taking-they are perfect.  His vitamin D is just mildly insufficient at 28.  This is commonly seen with decreased kidney function, such as his.  I would recommend he start 600 units of OTC vitamin D a day-if he is not taking any vitamin D at this time. His calcium levels are normal.  His parathyroid hormone is above normal.  This also is seen with decreased kidney function.  We will call him back on the specific results once the lab reruns the test for accuracy.  For staff and correcting the parathyroid hormone abnormality is correcting the vitamin D levels to normal.  Therefore again, I recommend he start 600 units of vitamin D daily.   Please schedule him for follow-up on his chronic conditions in 3-4 months, so that we can closer monitor his lab abnormalities.

## 2019-09-27 LAB — PTH, INTACT AND CALCIUM

## 2019-09-27 NOTE — Telephone Encounter (Signed)
Pt was called and given lab results and instructions. He verbalized understanding. Pt did say he is taking multivitamin and he would check to see how much Vit D is in that.

## 2019-09-27 NOTE — Telephone Encounter (Signed)
Pt was called with lab results for the new PTH hormone levels. Wife stated in his multivitamin there is 1,000 units of Vit D. He has not been taking with a meal and he will start to do that. Please advise if they need to increase this dose.   Pt would like my chart sent if new dose needs to be taken.

## 2019-09-27 NOTE — Telephone Encounter (Signed)
Yes. increase his vitamin D by adding 600 units of vitamin D daily

## 2019-09-28 ENCOUNTER — Telehealth: Payer: Self-pay

## 2019-09-28 NOTE — Telephone Encounter (Signed)
Patient called requesting Flomax be sent to [facility]

## 2019-10-01 ENCOUNTER — Encounter: Payer: Self-pay | Admitting: Family Medicine

## 2019-10-01 DIAGNOSIS — N1832 Chronic kidney disease, stage 3b: Secondary | ICD-10-CM | POA: Insufficient documentation

## 2019-10-01 DIAGNOSIS — N400 Enlarged prostate without lower urinary tract symptoms: Secondary | ICD-10-CM | POA: Insufficient documentation

## 2019-10-01 NOTE — Telephone Encounter (Signed)
Called pt and left detailed voicemail informing him that this is prescribed by another MD, and should be refilled by the prescribing physician.

## 2019-10-04 ENCOUNTER — Telehealth: Payer: Self-pay

## 2019-10-04 NOTE — Telephone Encounter (Signed)
Attempted to call pt and inform him that the hemocult that he completed after his last office visit was unable to be tested because the [employer] did not deliver in a timely manner to the lab. Called and left voicemail for pt requesting that he call us back so he can be informed that he needs to pick up a new hemocult kit and collect the specimen and deliver it directly to this office for testing.

## 2019-10-04 NOTE — Telephone Encounter (Signed)
Pt returned phone call and he was given the message below. Pt verbalized understanding that he is to come to this office, pick up a new hemocult testing kit, and bring the sample back to this office once the sample has been collected, and do not send it through the postal service.

## 2019-10-08 ENCOUNTER — Ambulatory Visit: Payer: Medicare Other | Attending: Internal Medicine

## 2019-10-08 DIAGNOSIS — Z23 Encounter for immunization: Secondary | ICD-10-CM | POA: Insufficient documentation

## 2019-10-08 NOTE — Progress Notes (Signed)
   Covid-19 Vaccination Clinic  Name:  Todd Romero    MRN: [id number] DOB: 1933/12/18  10/08/2019  Mr. Messina was observed post Covid-19 immunization for 15 minutes without incidence. He was provided with Vaccine Information Sheet and instruction to access the V-Safe system.   Mr. Hustead was instructed to call 911 with any severe reactions post vaccine: [name] Difficulty breathing  . Swelling of your face and throat  . A fast heartbeat  . A bad rash all over your body  . Dizziness and weakness    Immunizations Administered    Name Date Dose VIS Date Route   Pfizer COVID-19 Vaccine 10/08/2019  8:27 AM 0.3 mL 08/17/2019 Intramuscular   Manufacturer: [place]   Lot: [id number]   [place]: [id number]

## 2019-10-24 ENCOUNTER — Encounter (HOSPITAL_COMMUNITY): Payer: Self-pay

## 2019-10-24 ENCOUNTER — Emergency Department (HOSPITAL_COMMUNITY): Payer: Medicare Other

## 2019-10-24 ENCOUNTER — Other Ambulatory Visit: Payer: Self-pay

## 2019-10-24 ENCOUNTER — Inpatient Hospital Stay (HOSPITAL_COMMUNITY)
Admission: EM | Admit: 2019-10-24 | Discharge: 2019-10-26 | DRG: 243 | Disposition: A | Discharge status: Home / Self Care | Payer: Medicare Other | Attending: Internal Medicine | Admitting: Internal Medicine

## 2019-10-24 DIAGNOSIS — I4819 Other persistent atrial fibrillation: Secondary | ICD-10-CM | POA: Diagnosis present

## 2019-10-24 DIAGNOSIS — R55 Syncope and collapse: Secondary | ICD-10-CM

## 2019-10-24 DIAGNOSIS — N1832 Chronic kidney disease, stage 3b: Secondary | ICD-10-CM | POA: Diagnosis present

## 2019-10-24 DIAGNOSIS — I5042 Chronic combined systolic (congestive) and diastolic (congestive) heart failure: Secondary | ICD-10-CM | POA: Diagnosis present

## 2019-10-24 DIAGNOSIS — Z8249 Family history of ischemic heart disease and other diseases of the circulatory system: Secondary | ICD-10-CM | POA: Diagnosis not present

## 2019-10-24 DIAGNOSIS — N184 Chronic kidney disease, stage 4 (severe): Secondary | ICD-10-CM

## 2019-10-24 DIAGNOSIS — I130 Hypertensive heart and chronic kidney disease with heart failure and stage 1 through stage 4 chronic kidney disease, or unspecified chronic kidney disease: Secondary | ICD-10-CM | POA: Diagnosis present

## 2019-10-24 DIAGNOSIS — Z7901 Long term (current) use of anticoagulants: Secondary | ICD-10-CM

## 2019-10-24 DIAGNOSIS — Z961 Presence of intraocular lens: Secondary | ICD-10-CM | POA: Diagnosis present

## 2019-10-24 DIAGNOSIS — Z825 Family history of asthma and other chronic lower respiratory diseases: Secondary | ICD-10-CM

## 2019-10-24 DIAGNOSIS — Z952 Presence of prosthetic heart valve: Secondary | ICD-10-CM

## 2019-10-24 DIAGNOSIS — Z9842 Cataract extraction status, left eye: Secondary | ICD-10-CM | POA: Diagnosis not present

## 2019-10-24 DIAGNOSIS — Z86010 Personal history of colonic polyps: Secondary | ICD-10-CM

## 2019-10-24 DIAGNOSIS — N179 Acute kidney failure, unspecified: Secondary | ICD-10-CM | POA: Diagnosis present

## 2019-10-24 DIAGNOSIS — H353130 Nonexudative age-related macular degeneration, bilateral, stage unspecified: Secondary | ICD-10-CM | POA: Diagnosis present

## 2019-10-24 DIAGNOSIS — Z9049 Acquired absence of other specified parts of digestive tract: Secondary | ICD-10-CM | POA: Diagnosis not present

## 2019-10-24 DIAGNOSIS — E11319 Type 2 diabetes mellitus with unspecified diabetic retinopathy without macular edema: Secondary | ICD-10-CM | POA: Diagnosis not present

## 2019-10-24 DIAGNOSIS — Z950 Presence of cardiac pacemaker: Secondary | ICD-10-CM

## 2019-10-24 DIAGNOSIS — Z885 Allergy status to narcotic agent status: Secondary | ICD-10-CM

## 2019-10-24 DIAGNOSIS — E785 Hyperlipidemia, unspecified: Secondary | ICD-10-CM | POA: Diagnosis present

## 2019-10-24 DIAGNOSIS — Z79899 Other long term (current) drug therapy: Secondary | ICD-10-CM

## 2019-10-24 DIAGNOSIS — R001 Bradycardia, unspecified: Secondary | ICD-10-CM | POA: Diagnosis present

## 2019-10-24 DIAGNOSIS — I255 Ischemic cardiomyopathy: Secondary | ICD-10-CM

## 2019-10-24 DIAGNOSIS — N400 Enlarged prostate without lower urinary tract symptoms: Secondary | ICD-10-CM | POA: Diagnosis present

## 2019-10-24 DIAGNOSIS — I495 Sick sinus syndrome: Secondary | ICD-10-CM | POA: Diagnosis present

## 2019-10-24 DIAGNOSIS — I4891 Unspecified atrial fibrillation: Secondary | ICD-10-CM | POA: Diagnosis not present

## 2019-10-24 DIAGNOSIS — Z96651 Presence of right artificial knee joint: Secondary | ICD-10-CM | POA: Diagnosis present

## 2019-10-24 DIAGNOSIS — E1122 Type 2 diabetes mellitus with diabetic chronic kidney disease: Secondary | ICD-10-CM | POA: Diagnosis present

## 2019-10-24 DIAGNOSIS — I453 Trifascicular block: Secondary | ICD-10-CM | POA: Diagnosis present

## 2019-10-24 DIAGNOSIS — I959 Hypotension, unspecified: Secondary | ICD-10-CM

## 2019-10-24 DIAGNOSIS — E119 Type 2 diabetes mellitus without complications: Secondary | ICD-10-CM

## 2019-10-24 DIAGNOSIS — Z9841 Cataract extraction status, right eye: Secondary | ICD-10-CM

## 2019-10-24 DIAGNOSIS — I428 Other cardiomyopathies: Secondary | ICD-10-CM | POA: Diagnosis present

## 2019-10-24 DIAGNOSIS — Z882 Allergy status to sulfonamides status: Secondary | ICD-10-CM

## 2019-10-24 DIAGNOSIS — Z794 Long term (current) use of insulin: Secondary | ICD-10-CM

## 2019-10-24 DIAGNOSIS — Z823 Family history of stroke: Secondary | ICD-10-CM

## 2019-10-24 DIAGNOSIS — Z20822 Contact with and (suspected) exposure to covid-19: Secondary | ICD-10-CM | POA: Diagnosis present

## 2019-10-24 DIAGNOSIS — Z9089 Acquired absence of other organs: Secondary | ICD-10-CM | POA: Diagnosis not present

## 2019-10-24 DIAGNOSIS — Z8349 Family history of other endocrine, nutritional and metabolic diseases: Secondary | ICD-10-CM

## 2019-10-24 DIAGNOSIS — I2510 Atherosclerotic heart disease of native coronary artery without angina pectoris: Secondary | ICD-10-CM | POA: Diagnosis present

## 2019-10-24 DIAGNOSIS — G4733 Obstructive sleep apnea (adult) (pediatric): Secondary | ICD-10-CM | POA: Diagnosis present

## 2019-10-24 HISTORY — DX: Bradycardia, unspecified: R00.1

## 2019-10-24 HISTORY — DX: Acute kidney failure, unspecified: N17.9

## 2019-10-24 LAB — COMPREHENSIVE METABOLIC PANEL
ALT: 15 U/L (ref 0–44)
AST: 26 U/L (ref 15–41)
Albumin: 4 g/dL (ref 3.5–5.0)
Alkaline Phosphatase: 56 U/L (ref 38–126)
Anion gap: 17 — ABNORMAL HIGH (ref 5–15)
BUN: 90 mg/dL — ABNORMAL HIGH (ref 8–23)
CO2: 19 mmol/L — ABNORMAL LOW (ref 22–32)
Calcium: 8.8 mg/dL — ABNORMAL LOW (ref 8.9–10.3)
Chloride: 99 mmol/L (ref 98–111)
Creatinine, Ser: 2.57 mg/dL — ABNORMAL HIGH (ref 0.61–1.24)
GFR calc Af Amer: 26 mL/min — ABNORMAL LOW (ref >60)
GFR calc non Af Amer: 22 mL/min — ABNORMAL LOW (ref >60)
Glucose, Bld: 223 mg/dL — ABNORMAL HIGH (ref 70–99)
Potassium: 4.9 mmol/L (ref 3.5–5.1)
Sodium: 135 mmol/L (ref 135–145)
Total Bilirubin: 0.9 mg/dL (ref 0.3–1.2)
Total Protein: 6.8 g/dL (ref 6.5–8.1)

## 2019-10-24 LAB — CBC WITH DIFFERENTIAL/PLATELET
Abs Immature Granulocytes: 0.07 10*3/uL (ref 0.00–0.07)
Basophils Absolute: 0 10*3/uL (ref 0.0–0.1)
Basophils Relative: 0 %
Eosinophils Absolute: 0.1 10*3/uL (ref 0.0–0.5)
Eosinophils Relative: 1 %
HCT: 35.7 % — ABNORMAL LOW (ref 39.0–52.0)
Hemoglobin: 11.6 g/dL — ABNORMAL LOW (ref 13.0–17.0)
Immature Granulocytes: 1 %
Lymphocytes Relative: 16 %
Lymphs Abs: 1.7 10*3/uL (ref 0.7–4.0)
MCH: 31.4 pg (ref 26.0–34.0)
MCHC: 32.5 g/dL (ref 30.0–36.0)
MCV: 96.7 fL (ref 80.0–100.0)
Monocytes Absolute: 0.9 10*3/uL (ref 0.1–1.0)
Monocytes Relative: 9 %
Neutro Abs: 7.6 10*3/uL (ref 1.7–7.7)
Neutrophils Relative %: 73 %
Platelets: 132 10*3/uL — ABNORMAL LOW (ref 150–400)
RBC: 3.69 MIL/uL — ABNORMAL LOW (ref 4.22–5.81)
RDW: 13.4 % (ref 11.5–15.5)
WBC: 10.3 10*3/uL (ref 4.0–10.5)
nRBC: 0 % (ref 0.0–0.2)

## 2019-10-24 LAB — CBG MONITORING, ED
Glucose-Capillary: 201 mg/dL — ABNORMAL HIGH (ref 70–99)
Glucose-Capillary: 155 mg/dL — ABNORMAL HIGH (ref 70–99)

## 2019-10-24 LAB — URINALYSIS, ROUTINE W REFLEX MICROSCOPIC
Bilirubin Urine: NEGATIVE
Glucose, UA: NEGATIVE mg/dL
Hgb urine dipstick: NEGATIVE
Ketones, ur: NEGATIVE mg/dL
Leukocytes,Ua: NEGATIVE
Nitrite: NEGATIVE
Protein, ur: NEGATIVE mg/dL
Specific Gravity, Urine: 1.011 (ref 1.005–1.030)
pH: 5 (ref 5.0–8.0)

## 2019-10-24 LAB — TROPONIN I (HIGH SENSITIVITY)
Troponin I (High Sensitivity): 38 ng/L — ABNORMAL HIGH (ref <18)
Troponin I (High Sensitivity): 40 ng/L — ABNORMAL HIGH (ref <18)

## 2019-10-24 LAB — MAGNESIUM: Magnesium: 2.1 mg/dL (ref 1.7–2.4)

## 2019-10-24 LAB — BRAIN NATRIURETIC PEPTIDE: B Natriuretic Peptide: 125 pg/mL — ABNORMAL HIGH (ref 0.0–100.0)

## 2019-10-24 MED ORDER — SODIUM CHLORIDE 0.9% FLUSH
3.0000 mL | Freq: Two times a day (BID) | INTRAVENOUS | Status: DC
  Administered 2019-10-25 (×2): 3 mL via INTRAVENOUS

## 2019-10-24 MED ORDER — INSULIN ASPART 100 UNIT/ML ~~LOC~~ SOLN: 0.0000 [IU] | Freq: Every day | SUBCUTANEOUS | Status: DC

## 2019-10-24 MED ORDER — ACETAMINOPHEN 325 MG PO TABS: 650.0000 mg | ORAL_TABLET | Freq: Four times a day (QID) | ORAL | Status: DC | PRN

## 2019-10-24 MED ORDER — TAMSULOSIN HCL 0.4 MG PO CAPS
0.4000 mg | ORAL_CAPSULE | Freq: Every evening | ORAL | Status: DC
  Administered 2019-10-24 – 2019-10-25 (×2): 0.4 mg via ORAL
  Filled 2019-10-24 (×2): qty 1

## 2019-10-24 MED ORDER — INSULIN NPH (HUMAN) (ISOPHANE) 100 UNIT/ML ~~LOC~~ SUSP
10.0000 [IU] | Freq: Every day | SUBCUTANEOUS | Status: DC
  Administered 2019-10-24 – 2019-10-25 (×2): 10 [IU] via SUBCUTANEOUS
  Filled 2019-10-24 (×2): qty 10

## 2019-10-24 MED ORDER — INSULIN ASPART 100 UNIT/ML ~~LOC~~ SOLN: 0.0000 [IU] | Freq: Three times a day (TID) | SUBCUTANEOUS | Status: DC

## 2019-10-24 MED ORDER — VITAMIN D 25 MCG (1000 UNIT) PO TABS
2000.0000 [IU] | ORAL_TABLET | Freq: Every day | ORAL | Status: DC
  Administered 2019-10-25 – 2019-10-26 (×2): 2000 [IU] via ORAL
  Filled 2019-10-24 (×2): qty 2

## 2019-10-24 MED ORDER — SODIUM CHLORIDE 0.9 % IV BOLUS
500.0000 mL | Freq: Once | INTRAVENOUS | Status: AC
  Administered 2019-10-24: 500 mL via INTRAVENOUS

## 2019-10-24 MED ORDER — ONDANSETRON HCL 4 MG PO TABS: 4.0000 mg | ORAL_TABLET | Freq: Four times a day (QID) | ORAL | Status: DC | PRN

## 2019-10-24 MED ORDER — PRAVASTATIN SODIUM 40 MG PO TABS
80.0000 mg | ORAL_TABLET | Freq: Every evening | ORAL | Status: DC
  Administered 2019-10-24 – 2019-10-25 (×2): 80 mg via ORAL
  Filled 2019-10-24 (×2): qty 2

## 2019-10-24 MED ORDER — APIXABAN 2.5 MG PO TABS
2.5000 mg | ORAL_TABLET | Freq: Two times a day (BID) | ORAL | Status: DC
  Administered 2019-10-24 – 2019-10-25 (×2): 2.5 mg via ORAL
  Filled 2019-10-24 (×2): qty 1

## 2019-10-24 MED ORDER — FERROUS SULFATE 325 (65 FE) MG PO TABS
325.0000 mg | ORAL_TABLET | Freq: Two times a day (BID) | ORAL | Status: DC
  Administered 2019-10-25 – 2019-10-26 (×3): 325 mg via ORAL
  Filled 2019-10-24 (×3): qty 1

## 2019-10-24 MED ORDER — ACETAMINOPHEN 650 MG RE SUPP: 650.0000 mg | Freq: Four times a day (QID) | RECTAL | Status: DC | PRN

## 2019-10-24 MED ORDER — PANTOPRAZOLE SODIUM 40 MG PO TBEC
40.0000 mg | DELAYED_RELEASE_TABLET | Freq: Two times a day (BID) | ORAL | Status: DC
  Administered 2019-10-24 – 2019-10-26 (×4): 40 mg via ORAL
  Filled 2019-10-24 (×4): qty 1

## 2019-10-24 MED ORDER — ONDANSETRON HCL 4 MG/2ML IJ SOLN: 4.0000 mg | Freq: Four times a day (QID) | INTRAMUSCULAR | Status: DC | PRN

## 2019-10-24 NOTE — ED Notes (Signed)
WIFE [name] [phone] WOULD LIKE AN UPDATE

## 2019-10-24 NOTE — Consult Note (Signed)
Cardiology Consultation:   Patient ID: Todd Romero MRN: [id number]; DOB: March 18, 1934  Admit date: 10/24/2019 Date of Consult: 10/24/2019  Primary Care Provider: Ma Hillock, DO Primary Cardiologist: Sinclair Grooms, MD  Primary Electrophysiologist:  None    Patient Profile:   Todd Romero is a 86 y.o. male with a hx of CAD with CTO of D1 and 90% LCx (on cath in 2013), ICM w/ EF 35-40%, paroxysmal afib, DM2, dyslipidemia, OSA (not on CPAP), HTN, h/o AS s/p TAVR in 2018 who is being seen today for the evaluation of afib w/ slow VR at the request of Jennette Kettle, DO.  History of Present Illness:   Todd Romero has h/o known paroxysmal afib, s/p DCCV in may 2020, seen recently 09-24-19 in the afib clinic (in [place] at that time), on chronic PO amiodarone as well as low-dose eliquis for CVA prophylaxis, and was doing well at that time.  He says he has actually been doing fine until earlier today when he was working in the yard. He stood up after completing his work and all of a sudden felt dizzy, lightheaded. No frank syncope. EMS was called, and pt's BP was 98/55 upon EMS arrival. He did not have any associated chest discomfort, SOB, nausea, diaphoresis or palpitations. EMS noted pt to be in afib, with transient bradycardia on the way to the ED, and pt had a few episodes of slow ventricular response w/ HR down into the 30s briefly (while in afib) in the ED. He was given 500cc bolus in the ED due to his hypotension. He has not had any RVR.  Heart Pathway Score:     Past Medical History:  Diagnosis Date  . Anemia   . Arthritis   . CAD (coronary artery disease)    a. cardiac cath 12/2016 showing moderate AS, elevated LVEDP, heavy 3V coronary calcification, 100% mD2, 30-50% LAD, 50-90% stenosis of Cx proximal to origin of L-PDA, RCA not engaged due to poor catheter control (difficult procedure) - coronary status was essentially unchanged from prior.  . Chicken pox   . Chronic  diastolic CHF (congestive heart failure) ([place])   . Colon polyps   . Degenerative arthritis   . Diabetes ([place])   . Dyspnea   . Essential hypertension   . Heart disease   . Heme positive stool 11/16/2017  . Hyperlipidemia   . New onset a-fib ([place]) 03/04/2018  . Obesity (BMI 30-39.9) 06/18/2015  . OSA (obstructive sleep apnea)    Severe with AHI 27/hr now on CPAP  not compliant with treatment.  . Peritonitis ([place]) 1985?  [name] Pneumonia    "6 months - 86 years old"  . Pure hypercholesterolemia   . RBBB   . [facility] spotted fever   . 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 Severe aortic stenosis    a. s/p TAVR 03/2017.  . Type II or unspecified type diabetes mellitus without mention of complication, not stated as uncontrolled   . Varicose vein of leg     Past Surgical History:  Procedure Laterality Date  . APPENDECTOMY  1985   peritonitis  . CARDIOVERSION N/A 04/03/2018   Procedure: CARDIOVERSION;  Surgeon: Josue Hector, MD;  Location: [facility] ENDOSCOPY;  Service: Cardiovascular;  Laterality: N/A;  . CARDIOVERSION N/A 01/18/2019   Procedure: CARDIOVERSION;  Surgeon: Skeet Latch, MD;  Location: [place];  Service: Cardiovascular;  Laterality: N/A;  . CARPAL TUNNEL RELEASE    . COLONOSCOPY WITH PROPOFOL N/A 11/18/2017   Procedure: COLONOSCOPY WITH PROPOFOL;  Surgeon: Carol Ada, MD;  Location: [facility] ENDOSCOPY;  Service: Endoscopy;  Laterality: N/A;  . ESOPHAGOGASTRODUODENOSCOPY N/A 11/18/2017   Procedure: ESOPHAGOGASTRODUODENOSCOPY (EGD);  Surgeon: Carol Ada, MD;  Location: [facility] ENDOSCOPY;  Service: Endoscopy;  Laterality: N/A;  . EYE SURGERY Bilateral    cataracts removed, lens placed  . JOINT REPLACEMENT    . KNEE CARTILAGE SURGERY    . PARTIAL COLECTOMY    . RIGHT/LEFT HEART CATH AND CORONARY ANGIOGRAPHY N/A 12/28/2016   Procedure: Right/Left  Heart Cath and Coronary Angiography;  Surgeon: Belva Crome, MD;  Location: [place] CV LAB;  Service: Cardiovascular;  Laterality: N/A;  . TEE WITHOUT CARDIOVERSION N/A 03/15/2017   Procedure: TRANSESOPHAGEAL ECHOCARDIOGRAM (TEE);  Surgeon: Burnell Blanks, MD;  Location: [place];  Service: Open Heart Surgery;  Laterality: N/A;  . TONSILLECTOMY AND ADENOIDECTOMY  1942  . TOTAL KNEE ARTHROPLASTY Right 11/08/2017  . TOTAL KNEE ARTHROPLASTY Right 11/08/2017   Procedure: TOTAL KNEE ARTHROPLASTY;  Surgeon: Melrose Nakayama, MD;  Location: [place];  Service: Orthopedics;  Laterality: Right;  . TRANSCATHETER AORTIC VALVE REPLACEMENT, TRANSFEMORAL N/A 03/15/2017   Procedure: TRANSCATHETER AORTIC VALVE REPLACEMENT, TRANSFEMORAL;  Surgeon: Burnell Blanks, MD;  Location: [facility];  Service: Open Heart Surgery;  Laterality: N/A;  . VEIN SURGERY  1980     Home Medications:  Prior to Admission medications   Medication Sig Start Date End Date Taking? Authorizing Provider  ACCU-CHEK AVIVA PLUS test strip USE AS INSTRUCTED TO CHECK BLOOD SUGAR TWICE A DAY DX:E11.65 08/05/19  Yes [name], MD  acetaminophen (TYLENOL) 500 MG tablet Take 500 mg by mouth every 4 (four) hours as needed for moderate pain or headache.   Yes [provider]  amiodarone (PACERONE) 200 MG tablet Take 0.5 tablets (100 mg total) by mouth daily. 06/15/19  Yes Allred, Jeneen Rinks, MD  apixaban (ELIQUIS) 2.5 MG TABS tablet Take 1 tablet (2.5 mg total) by mouth 2 (two) times daily. 01/15/19  Yes Belva Crome, MD  BD INSULIN SYRINGE U/F 31G X 5/16" 1 ML MISC USE TO INJECT INSULIN DAILY 03/13/19  Yes [name], MD  Cholecalciferol (VITAMIN D) 50 MCG (2000 UT) tablet Take 2,000 Units by mouth daily.   Yes [provider]  ferrous sulfate 325 (65 FE) MG tablet Take 325 mg by mouth 2 (two) times daily with a meal.    Yes [provider]  furosemide (LASIX) 40 MG tablet Take two tablets (80mg  total) by mouth every  morning.  Take one tablet by mouth every afternoon. Patient taking differently: Take 40-80 mg by mouth 2 (two) times daily. Take 80mg  in the morning and 40mg  in the afternoon per patient 09/10/19  Yes Belva Crome, MD  glimepiride (AMARYL) 2 MG tablet TAKE 1 TABLET before breakfast daily Patient taking differently: Take 2 mg by mouth daily.  07/09/19  Yes [name], MD  insulin lispro (HUMALOG KWIKPEN) 100 UNIT/ML KwikPen Inject 12 Units into the skin daily. Inject 12 units under the skin before the main meal of the day.   Yes [provider]  insulin NPH Human (HUMULIN N) 100 UNIT/ML injection Inject 26 units of Humulin N under the skin once daily at bedtime. Patient taking differently: Inject 26 Units into the skin at bedtime.  01/11/19  Yes [name], MD  Insulin Pen Needle (B-D  UF III MINI PEN NEEDLES) 31G X 5 MM MISC USE 2 PEN NEEDLE PER DAY WITH HUMALOG AND VICTOZA 05/17/19  Yes [name], MD  metolazone (ZAROXOLYN) 5 MG tablet Taken on tablet by mouth daily on Monday and Thursday.  Take 30 minutes prior to Furosemide. Patient taking differently: Take 5 mg by mouth See admin instructions. Taken one 5mg   tablet by mouth daily on Monday and Thursday.  Take 30 minutes prior to Furosemide. 09/10/19  Yes Belva Crome, MD  Multiple Vitamin (MULTIVITAMIN WITH MINERALS) TABS tablet Take 1 tablet by mouth daily. Mens One a day Vit   Yes [provider]  Multiple Vitamins-Minerals (PRESERVISION AREDS 2 PO) Take 1 capsule by mouth 2 (two) times daily.   Yes [provider]  olmesartan (BENICAR) 20 MG tablet TAKE 1 TABLET BY MOUTH EVERY DAY Patient taking differently: Take 20 mg by mouth daily.  04/23/19  Yes Belva Crome, MD  pantoprazole (PROTONIX) 40 MG tablet Take 1 tablet (40 mg total) by mouth 2 (two) times daily. 11/13/18  Yes Burtis Junes, NP  pravastatin (PRAVACHOL) 80 MG tablet Take 1 tablet (80 mg total) by mouth every evening. 09/26/19  Yes Kuneff, Renee A, DO    tamsulosin (FLOMAX) 0.4 MG CAPS capsule Take 1 capsule daily Patient taking differently: Take 0.4 mg by mouth every evening.  09/26/19  Yes Kuneff, Renee A, DO  VICTOZA 18 MG/3ML SOPN INJECT 1.8 MG UNDER THE SKIN AT BEDTIME Patient taking differently: Inject 1.8 mg into the skin at bedtime. INJECT 1.8 MG UNDER THE SKIN AT BEDTIME 09/17/19  Yes [name], MD  vitamin C (ASCORBIC ACID) 500 MG tablet Take 500 mg by mouth daily.   Yes [provider]    Inpatient Medications: Scheduled Meds: . apixaban  2.5 mg Oral BID  . [START ON 10/25/2019] ferrous sulfate  325 mg Oral BID WC  . insulin aspart  0-5 Units Subcutaneous QHS  . [START ON 10/25/2019] insulin aspart  0-9 Units Subcutaneous TID WC  . insulin NPH Human  10 Units Subcutaneous QHS  . pantoprazole  40 mg Oral BID  . pravastatin  80 mg Oral QPM  . sodium chloride flush  3 mL Intravenous Q12H  . tamsulosin  0.4 mg Oral QPM  . Vitamin D  2,000 Units Oral Daily   Continuous Infusions:  PRN Meds: acetaminophen **OR** acetaminophen, ondansetron **OR** ondansetron (ZOFRAN) IV  Allergies:    Allergies  Allergen Reactions  . Bactrim [Sulfamethoxazole-Trimethoprim] Nausea And Vomiting and Other (See Comments)    Bleeding and ulcers  . Morphine And Related Nausea And Vomiting    Social History:   Social History   Socioeconomic History  . Marital status: Married    Spouse name: Not on file  . Number of children: 4  . Years of education: Not on file  . Highest education level: Not on file  Occupational History  . Occupation: Construction-Retired  Tobacco Use  . Smoking status: Never Smoker  . Smokeless tobacco: Never Used  Substance and Sexual Activity  . Alcohol use: No  . Drug use: No  . Sexual activity: Not Currently    Partners: Female  Other Topics Concern  . 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, retired Soil scientist:      -smoke alarm in the home:Yes     - wears seatbelt: Yes     -  Feels safe in their relationships: Yes   Social Determinants of Health   Financial Resource Strain:   . Difficulty of Paying Living Expenses: Not on file  Food Insecurity:   . Worried About Charity fundraiser in the Last Year: Not on file  . Ran Out of Food in the Last Year: Not on file  Transportation Needs:   . Lack of Transportation (Medical): Not on file  . Lack of Transportation (Non-Medical): Not on file  Physical Activity:   . Days of Exercise per Week: Not on file  . Minutes of Exercise per Session: Not on file  Stress:   . Feeling of Stress : Not on file  Social Connections:   . Frequency of Communication with Friends and Family: Not on file  . Frequency of Social Gatherings with Friends and Family: Not on file  . Attends Religious Services: Not on file  . Active Member of Clubs or Organizations: Not on file  . Attends Archivist Meetings: Not on file  . Marital Status: Not on file  Intimate Partner Violence:   . Fear of Current or Ex-Partner: Not on file  . Emotionally Abused: Not on file  . Physically Abused: Not on file  . Sexually Abused: Not on file    Family History:    Family History  Problem Relation Age of Onset  . Heart failure Father   . Emphysema Father   . COPD Father   . Early death Father   . Heart attack Father   . Hypertension Mother   . Early death Mother   . Hyperlipidemia Mother   . Stroke Mother   . Cancer Sister   . Hypertension Sister   . Healthy Sister      ROS:  Please see the history of present illness.   All other ROS reviewed and negative.     Physical Exam/Data:   Vitals:   10/24/19 1845 10/24/19 1900 10/24/19 1942 10/24/19 2000  BP:  124/85  136/75  Pulse: 73  (!) 39 73  Resp: 16 (!) 21  15  SpO2: 99%   98%  Weight:      Height:        Intake/Output Summary (Last 24 hours) at 10/24/2019 2107 Last data filed at 10/24/2019  1708 Gross per 24 hour  Intake 500 ml  Output --  Net 500 ml   Last 3 Weights 10/24/2019 09/25/2019 09/24/2019  Weight (lbs) 220 lb 233 lb 232 lb 12.8 oz  Weight (kg) 99.791 kg 105.688 kg 105.597 kg     Body mass index is 33.45 kg/m.  General:  Well nourished, well developed, in no acute distress HEENT: normal Lymph: no adenopathy Neck: no JVD Endocrine:  No thryomegaly Vascular: No carotid bruits; DP pulses 1+ bilaterally  Cardiac:  normal S1, S2; RRR; no murmur  Lungs:  clear to auscultation bilaterally, no wheezing, rhonchi or rales  Abd: soft, nontender, no hepatomegaly  Ext: no edema Musculoskeletal:  No deformities, BUE and BLE strength normal and equal Skin: warm and dry  Neuro:  CNs 2-12 intact, no focal abnormalities noted Psych:  Normal affect   EKG:  The EKG was personally reviewed and demonstrates:  Afib, RBBB, PVC, HR 52 Telemetry:  Telemetry was personally reviewed and demonstrates:  afib w/ slow VR  Relevant CV Studies: [facility] April 2018  Difficult procedure due to severe angulation in the right innominate/ascending aortic region that produced catheter resistance to torque and advancement.  Moderate aortic  stenosis with a transvalvular gradient of 29 mmHg (mean) and 33-38 mmHg (peak to peak). Calculated aortic valve area 1.35 cm.  Elevated left ventricular end-diastolic pressure and pulmonary capillary wedge pressure. Known LV systolic function with EF greater than 50% by recent echo suggesting chronic diastolic left heart failure.  Heavy three-vessel coronary calcification.  100% stenosis in the mid body of the second diagonal. There is diffuse 30-50% narrowing in the LAD.  The circumflex coronary is dominant. Moderate to heavy calcification is noted throughout the distal vessel and in the proximal segment. There is eccentric 50-90% stenosis proximal to the origin of the L-PDA.  Nondominant right coronary. Not selectively engaged due to poor catheter control.  The vessel is patent and supplies only the right ventricle. RECOMMENDATIONS: Coronary status is essentially unchanged compared to 5 years ago.  Moderate aortic stenosis based upon hemodynamics obtained from this case. This matches the recent echo.  Explanation for exertional fatigue is uncertain. Consider referral to the valve clinic for a second opinion concerning management of aortic stenosis. May need dobutamine echo as he is potentially a low gradient severe aortic stenosis substrate.  TTE 09-13-19 1. Left ventricular ejection fraction, by visual estimation, is 35 to  40%. The left ventricle has moderately decreased function. There is  moderately increased left ventricular hypertrophy.  2. Elevated left atrial pressure.  3. Left ventricular diastolic parameters are consistent with Grade II  diastolic dysfunction (pseudonormalization).  4. Global right ventricle has normal systolic function.The right  ventricular size is normal. No increase in right ventricular wall  thickness.  5. Left atrial size was moderately dilated.  6. Right atrial size was normal.  7. Moderate mitral annular calcification.  8. The mitral valve is abnormal. No evidence of mitral valve  regurgitation.  9. The tricuspid valve is normal in structure.  10. Bioprosthetic AVR. Aortic valve regurgitation is not visualized. Mean  gradient 9 mmHg, stable from prior TTE on 03/05/18  11. The pulmonic valve was not well visualized. Pulmonic valve  regurgitation is not visualized.  12. There is mild dilatation of the ascending aorta measuring 36 mm.  13. Mildly elevated pulmonary artery systolic pressure.  14. The tricuspid regurgitant velocity is 2.67 m/s, and with an assumed  right atrial pressure of 8 mmHg, the estimated right ventricular systolic  pressure is mildly elevated at 36.5 mmHg.  15. The inferior vena cava is dilated in size with >50% respiratory  variability, suggesting right atrial pressure of 8  mmHg.   Laboratory Data:  High Sensitivity Troponin:   Recent Labs  Lab 10/24/19 1913  TROPONINIHS 38*     Chemistry Recent Labs  Lab 10/24/19 1720  NA 135  K 4.9  CL 99  CO2 19*  GLUCOSE 223*  BUN 90*  CREATININE 2.57*  CALCIUM 8.8*  GFRNONAA 22*  GFRAA 26*  ANIONGAP 17*    Recent Labs  Lab 10/24/19 1720  PROT 6.8  ALBUMIN 4.0  AST 26  ALT 15  ALKPHOS 56  BILITOT 0.9   Hematology Recent Labs  Lab 10/24/19 1720  WBC 10.3  RBC 3.69*  HGB 11.6*  HCT 35.7*  MCV 96.7  MCH 31.4  MCHC 32.5  RDW 13.4  PLT 132*   BNP Recent Labs  Lab 10/24/19 1913  BNP 125.0*    DDimer No results for input(s): DDIMER in the last 168 hours.   Radiology/Studies:  DG Chest Portable 1 View  Result Date: 10/24/2019 CLINICAL DATA:  Presyncope EXAM: PORTABLE CHEST 1  VIEW COMPARISON:  03/28/2018 chest radiograph. FINDINGS: Aortic valve prosthesis in place. Stable cardiomediastinal silhouette with mild cardiomegaly. No pneumothorax. No pleural effusion. Lungs appear clear, with no acute consolidative airspace disease and no pulmonary edema. IMPRESSION: Mild cardiomegaly without pulmonary edema. No active pulmonary disease. Electronically Signed   By: Ilona Sorrel M.D.   On: 10/24/2019 18:26         Assessment and Plan:   1. Afib/bradycardia: would hold amiodarone for now. Pt not on any other AVN blocking agents. Pt may be declaring himself as tachy-brady syndrome; will have EP see him in the AM. He may be a candidate for PPM, +/- afib ablation. Cont eliquis for CVA prophylaxis. Keep NPO for now in case procedure planned for the AM. Pt had recent TTE; no need to repeat for now. 2. Hypotension: would not give any further IVF for now given h/o ICM. Hold ARB and diuretics for now. 3. CAD: no active anginal sx at this time.  4. AS s/p TAVR: AV functioning normally on recent TTE 5. ICM: EF 35-40% on recent TTE. No need to repeat at this time. hold losartan given  hypotension 6. Non-cardiac medical problems: as per primary team      For questions or updates, please contact [place] Please consult [email or web address] for contact info under     Signed, Rudean Curt, MD  10/24/2019 9:07 PM

## 2019-10-24 NOTE — H&P (Addendum)
History and Physical    Todd Romero ZOX:[id number] DOB: 27-Dec-1933 DOA: 10/24/2019  PCP: Ma Hillock, DO  Patient coming from: Home  I have personally briefly reviewed patient's old medical records in [place]  Chief Complaint: Near syncope, hypotension  HPI: Todd Romero is a 86 y.o. male with medical history significant of CAD, HFrEF 35-40% as of last month, AVS s/p TAVR, DM2, A.Fib on eliquis and amiodarone.  Patient states he was out working in the yard today.  When he went to stand up at the end, he felt dizzy and like he was going to pass out.  EMS was called, patient was found to be hypotensive with BP 98/55.  No further episodes of dizziness or weakness since then in ED.  No CP, no SOB, no N/V, no abd pain.  No fever, cough, no COVID contacts.   ED Course: In ED had bradycardia down to the 30s for a 3 min long episode.  BUN 90, creat 2.9 this up significantly from baseline of 1.8 just last month.  Given 500cc bolus.   Review of Systems: As per HPI, otherwise all review of systems negative.  Past Medical History:  Diagnosis Date  . Anemia   . Arthritis   . CAD (coronary artery disease)    a. cardiac cath 12/2016 showing moderate AS, elevated LVEDP, heavy 3V coronary calcification, 100% mD2, 30-50% LAD, 50-90% stenosis of Cx proximal to origin of L-PDA, RCA not engaged due to poor catheter control (difficult procedure) - coronary status was essentially unchanged from prior.  . Chicken pox   . Chronic diastolic CHF (congestive heart failure) ([place])   . Colon polyps   . Degenerative arthritis   . Diabetes ([place])   . Dyspnea   . Essential hypertension   . Heart disease   . Heme positive stool 11/16/2017  . Hyperlipidemia   . New onset a-fib ([place]) 03/04/2018  . Obesity (BMI 30-39.9) 06/18/2015  . OSA (obstructive sleep apnea)    Severe with AHI 27/hr now on CPAP  not compliant with treatment.  . Peritonitis ([place]) 1985?  [name] Pneumonia    "6  months - 86 years old"  . Pure hypercholesterolemia   . RBBB   . [facility] spotted fever   . 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 Severe aortic stenosis    a. s/p TAVR 03/2017.  . Type II or unspecified type diabetes mellitus without mention of complication, not stated as uncontrolled   . Varicose vein of leg     Past Surgical History:  Procedure Laterality Date  . APPENDECTOMY  1985   peritonitis  . CARDIOVERSION N/A 04/03/2018   Procedure: CARDIOVERSION;  Surgeon: Josue Hector, MD;  Location: [facility] ENDOSCOPY;  Service: Cardiovascular;  Laterality: N/A;  . CARDIOVERSION N/A 01/18/2019   Procedure: CARDIOVERSION;  Surgeon: Skeet Latch, MD;  Location: [place];  Service: Cardiovascular;  Laterality: N/A;  . CARPAL TUNNEL RELEASE    . COLONOSCOPY WITH PROPOFOL N/A 11/18/2017   Procedure: COLONOSCOPY WITH PROPOFOL;  Surgeon: Carol Ada, MD;  Location: [facility] ENDOSCOPY;  Service: Endoscopy;  Laterality: N/A;  . ESOPHAGOGASTRODUODENOSCOPY N/A 11/18/2017   Procedure: ESOPHAGOGASTRODUODENOSCOPY (EGD);  Surgeon: Carol Ada, MD;  Location: [facility] END[facility];  Service: Endoscopy;  Laterality: N/A;  . EYE SURGERY Bilateral    cataracts removed, lens placed  . JOINT REPLACEMENT    .  KNEE CARTILAGE SURGERY    . PARTIAL COLECTOMY    . RIGHT/LEFT HEART CATH AND CORONARY ANGIOGRAPHY N/A 12/28/2016   Procedure: Right/Left Heart Cath and Coronary Angiography;  Surgeon: Belva Crome, MD;  Location: [place] CV LAB;  Service: Cardiovascular;  Laterality: N/A;  . TEE WITHOUT CARDIOVERSION N/A 03/15/2017   Procedure: TRANSESOPHAGEAL ECHOCARDIOGRAM (TEE);  Surgeon: Burnell Blanks, MD;  Location: [place];  Service: Open Heart Surgery;  Laterality: N/A;  . TONSILLECTOMY AND ADENOIDECTOMY  1942  . TOTAL KNEE ARTHROPLASTY Right 11/08/2017  . TOTAL KNEE ARTHROPLASTY  Right 11/08/2017   Procedure: TOTAL KNEE ARTHROPLASTY;  Surgeon: Melrose Nakayama, MD;  Location: [facility];  Service: Orthopedics;  Laterality: Right;  . TRANSCATHETER AORTIC VALVE REPLACEMENT, TRANSFEMORAL N/A 03/15/2017   Procedure: TRANSCATHETER AORTIC VALVE REPLACEMENT, TRANSFEMORAL;  Surgeon: Burnell Blanks, MD;  Location: [place];  Service: Open Heart Surgery;  Laterality: N/A;  . [place]     reports that he has never smoked. He has never used smokeless tobacco. He reports that he does not drink alcohol or use drugs.  Allergies  Allergen Reactions  . Bactrim [Sulfamethoxazole-Trimethoprim] Nausea And Vomiting and Other (See Comments)    Bleeding and ulcers  . Morphine And Related Nausea And Vomiting    Family History  Problem Relation Age of Onset  . Heart failure Father   . Emphysema Father   . COPD Father   . Early death Father   . Heart attack Father   . Hypertension Mother   . Early death Mother   . Hyperlipidemia Mother   . Stroke Mother   . Cancer Sister   . Hypertension Sister   . Healthy Sister      Prior to Admission medications   Medication Sig Start Date End Date Taking? Authorizing Provider  ACCU-CHEK AVIVA PLUS test strip USE AS INSTRUCTED TO CHECK BLOOD SUGAR TWICE A DAY DX:E11.65 08/05/19  Yes [name], MD  acetaminophen (TYLENOL) 500 MG tablet Take 500 mg by mouth every 4 (four) hours as needed for moderate pain or headache.   Yes [provider]  amiodarone (PACERONE) 200 MG tablet Take 0.5 tablets (100 mg total) by mouth daily. 06/15/19  Yes Allred, Jeneen Rinks, MD  apixaban (ELIQUIS) 2.5 MG TABS tablet Take 1 tablet (2.5 mg total) by mouth 2 (two) times daily. 01/15/19  Yes Belva Crome, MD  BD INSULIN SYRINGE U/F 31G X 5/16" 1 ML MISC USE TO INJECT INSULIN DAILY 03/13/19  Yes [name], MD  Cholecalciferol (VITAMIN D) 50 MCG (2000 UT) tablet Take 2,000 Units by mouth daily.   Yes [provider]  ferrous sulfate 325 (65 FE)  MG tablet Take 325 mg by mouth 2 (two) times daily with a meal.    Yes [provider]  furosemide (LASIX) 40 MG tablet Take two tablets (80mg  total) by mouth every morning.  Take one tablet by mouth every afternoon. Patient taking differently: Take 40-80 mg by mouth 2 (two) times daily. Take 80mg  in the morning and 40mg  in the afternoon per patient 09/10/19  Yes Belva Crome, MD  glimepiride (AMARYL) 2 MG tablet TAKE 1 TABLET before breakfast daily Patient taking differently: Take 2 mg by mouth daily.  07/09/19  Yes [name], MD  insulin lispro (HUMALOG KWIKPEN) 100 UNIT/ML KwikPen Inject 12 Units into the skin daily. Inject 12 units under the skin before the main meal of the day.   Yes [provider]  insulin NPH Human (HUMULIN N) 100 UNIT/ML injection Inject 26 units of Humulin N under the skin once daily at bedtime. Patient taking differently: Inject 26 Units into the skin at bedtime.  01/11/19  Yes [name], MD  Insulin Pen Needle (B-D UF III MINI PEN NEEDLES) 31G X 5 MM MISC USE 2 PEN NEEDLE PER DAY WITH HUMALOG AND VICTOZA 05/17/19  Yes [name], MD  metolazone (ZAROXOLYN) 5 MG tablet Taken on tablet by mouth daily on Monday and Thursday.  Take 30 minutes prior to Furosemide. Patient taking differently: Take 5 mg by mouth See admin instructions. Taken one 5mg   tablet by mouth daily on Monday and Thursday.  Take 30 minutes prior to Furosemide. 09/10/19  Yes Belva Crome, MD  Multiple Vitamin (MULTIVITAMIN WITH MINERALS) TABS tablet Take 1 tablet by mouth daily. Mens One a day Vit   Yes [provider]  Multiple Vitamins-Minerals (PRESERVISION AREDS 2 PO) Take 1 capsule by mouth 2 (two) times daily.   Yes [provider]  olmesartan (BENICAR) 20 MG tablet TAKE 1 TABLET BY MOUTH EVERY DAY Patient taking differently: Take 20 mg by mouth daily.  04/23/19  Yes Belva Crome, MD  pantoprazole (PROTONIX) 40 MG tablet Take 1 tablet (40 mg total) by mouth 2 (two)  times daily. 11/13/18  Yes Burtis Junes, NP  pravastatin (PRAVACHOL) 80 MG tablet Take 1 tablet (80 mg total) by mouth every evening. 09/26/19  Yes Kuneff, Renee A, DO  tamsulosin (FLOMAX) 0.4 MG CAPS capsule Take 1 capsule daily Patient taking differently: Take 0.4 mg by mouth every evening.  09/26/19  Yes Kuneff, Renee A, DO  VICTOZA 18 MG/3ML SOPN INJECT 1.8 MG UNDER THE SKIN AT BEDTIME Patient taking differently: Inject 1.8 mg into the skin at bedtime. INJECT 1.8 MG UNDER THE SKIN AT BEDTIME 09/17/19  Yes [name], MD  vitamin C (ASCORBIC ACID) 500 MG tablet Take 500 mg by mouth daily.   Yes [provider]    Physical Exam: Vitals:   10/24/19 1845 10/24/19 1900 10/24/19 1942 10/24/19 2000  BP:  124/85  136/75  Pulse: 73  (!) 39 73  Resp: 16 (!) 21  15  SpO2: 99%   98%  Weight:      Height:        Constitutional: NAD, calm, comfortable Eyes: PERRL, lids and conjunctivae normal ENMT: Mucous membranes are moist. Posterior pharynx clear of any exudate or lesions.Normal dentition.  Neck: normal, supple, no masses, no thyromegaly Respiratory: clear to auscultation bilaterally, no wheezing, no crackles. Normal respiratory effort. No accessory muscle use.  Cardiovascular: Regular rate and rhythm, no murmurs / rubs / gallops. No extremity edema. 2+ pedal pulses. No carotid bruits.  Abdomen: no tenderness, no masses palpated. No hepatosplenomegaly. Bowel sounds positive.  Musculoskeletal: no clubbing / cyanosis. No joint deformity upper and lower extremities. Good ROM, no contractures. Normal muscle tone.  Skin: no rashes, lesions, ulcers. No induration Neurologic: CN 2-12 grossly intact. Sensation intact, DTR normal. Strength 5/5 in all 4.  Psychiatric: Normal judgment and insight. Alert and oriented x 3. Normal mood.    Labs on Admission: I have personally reviewed following labs and imaging studies  CBC: Recent Labs  Lab 10/24/19 1720  WBC 10.3  NEUTROABS 7.6  HGB  11.6*  HCT 35.7*  MCV 96.7  PLT 694*   Basic Metabolic Panel: Recent Labs  Lab 10/24/19 1720 10/24/19 1913  NA 135  --   K 4.9  --  CL 99  --   CO2 19*  --   GLUCOSE 223*  --   BUN 90*  --   CREATININE 2.57*  --   CALCIUM 8.8*  --   MG  --  2.1   GFR: Estimated Creatinine Clearance: 25 mL/min (A) (by C-G formula based on SCr of 2.57 mg/dL (H)). Liver Function Tests: Recent Labs  Lab 10/24/19 1720  AST 26  ALT 15  ALKPHOS 56  BILITOT 0.9  PROT 6.8  ALBUMIN 4.0   No results for input(s): LIPASE, AMYLASE in the last 168 hours. No results for input(s): AMMONIA in the last 168 hours. Coagulation Profile: No results for input(s): INR, PROTIME in the last 168 hours. Cardiac Enzymes: No results for input(s): CKTOTAL, CKMB, CKMBINDEX, TROPONINI in the last 168 hours. BNP (last 3 results) Recent Labs    12/27/18 0933 01/03/19 1025 09/03/19 1032  PROBNP 3,828* 2,975* 3,705*   HbA1C: No results for input(s): HGBA1C in the last 72 hours. CBG: Recent Labs  Lab 10/24/19 1731  GLUCAP 201*   Lipid Profile: No results for input(s): CHOL, HDL, LDLCALC, TRIG, CHOLHDL, LDLDIRECT in the last 72 hours. Thyroid Function Tests: No results for input(s): TSH, T4TOTAL, FREET4, T3FREE, THYROIDAB in the last 72 hours. Anemia Panel: No results for input(s): VITAMINB12, FOLATE, FERRITIN, TIBC, IRON, RETICCTPCT in the last 72 hours. Urine analysis:    Component Value Date/Time   COLORURINE YELLOW 10/24/2019 1910   APPEARANCEUR HAZY (A) 10/24/2019 1910   LABSPEC 1.011 10/24/2019 1910   PHURINE 5.0 10/24/2019 1910   GLUCOSEU NEGATIVE 10/24/2019 1910   GLUCOSEU NEGATIVE 03/14/2014 0826   HGBUR NEGATIVE 10/24/2019 1910   BILIRUBINUR NEGATIVE 10/24/2019 1910   BILIRUBINUR negative 01/22/2016 1019   KETONESUR NEGATIVE 10/24/2019 1910   PROTEINUR NEGATIVE 10/24/2019 1910   UROBILINOGEN 0.2 01/22/2016 1019   UROBILINOGEN 0.2 03/14/2014 0826   NITRITE NEGATIVE 10/24/2019 1910    LEUKOCYTESUR NEGATIVE 10/24/2019 1910    Radiological Exams on Admission: DG Chest Portable 1 View  Result Date: 10/24/2019 CLINICAL DATA:  Presyncope EXAM: PORTABLE CHEST 1 VIEW COMPARISON:  03/28/2018 chest radiograph. FINDINGS: Aortic valve prosthesis in place. Stable cardiomediastinal silhouette with mild cardiomegaly. No pneumothorax. No pleural effusion. Lungs appear clear, with no acute consolidative airspace disease and no pulmonary edema. IMPRESSION: Mild cardiomegaly without pulmonary edema. No active pulmonary disease. Electronically Signed   By: Ilona Sorrel M.D.   On: 10/24/2019 18:26    EKG: Independently reviewed.  Assessment/Plan Principal Problem:   Symptomatic bradycardia Active Problems:   Type 2 diabetes mellitus (HCC)   Persistent atrial fibrillation (HCC)   Chronic combined systolic and diastolic CHF (congestive heart failure) (HCC)   Stage 3b chronic kidney disease   AKI (acute kidney injury) ([place])    1. 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 3 mins of HR in the 30s in ED: A.Fib with slow ventricular response (so cant really assess wether this is CHB or what). 2. Tele monitor 3. Cards consult 4. Hold amiodarone, but not on any other anti-arythmics currently 5. Will hold off on echo and just let cards assess since he just had echo last month. 2. AKI on CKD stage 3b- 1. Holding ACEi 2. Holding diuretics 3. Got 500cc bolus in ED 4. Strict intake and output 5. Repeat BMP in AM 6. Presumably this is pre-renal due to BP drop earlier today / bradycardia. 3. Chronic combined CHF - 1. Holding diuretics as above 4. PAF - 1.  Hold amiodarone 2. Continue eliquis 5. DM2 - 1. Hold victoza and amaryl 2. Reduce NPH to 10u QHS for tonight 3. SSI sensitive scale AC  DVT prophylaxis: Eliquis Code Status: Full Family Communication: No family in room Disposition Plan: Home after admit Consults called: cardiology Admission status: Admit to  inpatient  Severity of Illness: The appropriate patient status for this patient is INPATIENT. Inpatient status is judged to be reasonable and necessary in order to provide the required intensity of service to ensure the patient's safety. The patient's presenting symptoms, physical exam findings, and initial radiographic and laboratory data in the context of their chronic comorbidities is felt to place them at high risk for further clinical deterioration. Furthermore, it is not anticipated that the patient will be medically stable for discharge from the hospital within 2 midnights of admission. The following factors support the patient status of inpatient.   IP status due to AKI, near syncope, and finding of bradycardia with rate in the 30s in the ED.   * I certify that at the point of admission it is my clinical judgment that the patient will require inpatient hospital care spanning beyond 2 midnights from the point of admission due to high intensity of service, high risk for further deterioration and high frequency of surveillance required.*    Karla Pavone M. DO [facility]  How to contact the [facility] Attending or Consulting provider [place] or covering provider during after hours [place], for this patient?  1. Check the care team in [facility] and look for a) attending/consulting [facility] provider listed and b) the [facility] team listed 2. Log into [email or web address]  Amion Physician Scheduling and messaging for groups and whole hospitals  On call and physician scheduling software for group practices, residents, hospitalists and other medical providers for call, clinic, rotation and shift schedules. On[employer] is a hospital-wide system for scheduling doctors and paging doctors on call. EasyPlot is for scientific plotting and data analysis.  [email or web address]  and use [place]'s universal password to access. If you do not have the password, please contact the hospital operator.  3. Locate the [facility] provider  you are looking for under [facility] and page to a number that you can be directly reached. 4. If you still have difficulty reaching the provider, please page the [facility] (Director on Call) for the Hospitalists listed on amion for assistance.  10/24/2019, 9:08 PM

## 2019-10-24 NOTE — ED Notes (Signed)
Monitor alarming, HR noted to be in 30's, Sophia PA aware. Strip printed and given to EDP.

## 2019-10-24 NOTE — ED Notes (Addendum)
Pt on the phone with wife [name]

## 2019-10-24 NOTE — ED Provider Notes (Signed)
[facility] EMERGENCY DEPARTMENT Provider Note   CSN: [id number] Arrival date & time: 10/24/19  1659     History Chief Complaint  Patient presents with  . Near Syncope  . Hypotension    Todd Romero is a 86 y.o. male presenting for evaluation of presyncope and hypotension.  Patient states he was out working in the yard today.  When he went to stand up at the end, he felt dizzy and like he was going to pass out.  EMS was called, patient was found to be hypotensive.  Patient states since then, he has not had further episodes of dizziness or weakness, however he is also not gone from sitting to standing.  He denies chest pain, shortness breath, nausea, vomiting, domino pain, urinary symptoms, abnormal bowel movements.  He continues to take Eliquis as prescribed.  Takes Lasix daily.  No recent medication changes.  He follows with Dr. Tamala Julian with cardiology.  He denies recent fevers, chills, cough.  He denies contact with known COVID-19 positive person.  Additional history obtained from chart review.  Patient with a history of anemia, CAD, CHF, diabetes, hypertension, hyperlipidemia, A. fib, OSA.   HPI     Past Medical History:  Diagnosis Date  . Anemia   . Arthritis   . CAD (coronary artery disease)    a. cardiac cath 12/2016 showing moderate AS, elevated LVEDP, heavy 3V coronary calcification, 100% mD2, 30-50% LAD, 50-90% stenosis of Cx proximal to origin of L-PDA, RCA not engaged due to poor catheter control (difficult procedure) - coronary status was essentially unchanged from prior.  . Chicken pox   . Chronic diastolic CHF (congestive heart failure) ([place])   . Colon polyps   . Degenerative arthritis   . Diabetes ([place])   . Dyspnea   . Essential hypertension   . Heart disease   . Heme positive stool 11/16/2017  . Hyperlipidemia   . New onset a-fib ([place]) 03/04/2018  . Obesity (BMI 30-39.9) 06/18/2015  . OSA (obstructive sleep apnea)    Severe with AHI  27/hr now on CPAP  not compliant with treatment.  . Peritonitis ([place]) 1985?  [name] Pneumonia    "6 months - 86 years old"  . Pure hypercholesterolemia   . RBBB   . [facility] spotted fever   . 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 Severe aortic stenosis    a. s/p TAVR 03/2017.  . Type II or unspecified type diabetes mellitus without mention of complication, not stated as uncontrolled   . Varicose vein of leg     Patient Active Problem List   Diagnosis Date Noted  . AKI (acute kidney injury) ([place]) 10/24/2019  . Symptomatic bradycardia 10/24/2019  . Benign prostatic hyperplasia 10/01/2019  . Stage 3b chronic kidney disease 10/01/2019  . Secondary hypercoagulable state ([place]) 09/24/2019  . 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 Chronic combined systolic and diastolic CHF (congestive heart failure) ([place]) 03/27/2018  . Persistent atrial fibrillation ([place]) 03/04/2018  . GIB (gastrointestinal bleeding) 11/18/2017  . Anemia 11/16/2017  . Leukocytosis 11/16/2017  . 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 Posterior vitreous detachment, left 03/02/2016  . Pseudophakia of both eyes 03/02/2016  . Aortic stenosis 12/18/2015  . Obesity (BMI 30-39.9) 06/18/2015  . Intermediate stage nonexudative age-related macular degeneration of both eyes 02/27/2015  . Obstructive sleep apnea 02/11/2015  . Dyspnea  on exertion 02/11/2015  . Primary osteoarthritis of right knee 03/18/2014  . Essential hypertension, benign 06/21/2013    Class: Chronic  . RBBB   . CAD (coronary artery disease)     Class: Chronic  . Hyperlipidemia   . Type 2 diabetes mellitus ([place]) 05/02/2013  . Retinal tear, left 11/16/2011    Past Surgical History:  Procedure Laterality Date  . APPENDECTOMY  1985   peritonitis  . CARDIOVERSION N/A 04/03/2018   Procedure: CARDIOVERSION;   Surgeon: Josue Hector, MD;  Location: [facility] ENDOSCOPY;  Service: Cardiovascular;  Laterality: N/A;  . CARDIOVERSION N/A 01/18/2019   Procedure: CARDIOVERSION;  Surgeon: Skeet Latch, MD;  Location: [facility];  Service: Cardiovascular;  Laterality: N/A;  . CARPAL TUNNEL RELEASE    . COLONOSCOPY WITH PROPOFOL N/A 11/18/2017   Procedure: COLONOSCOPY WITH PROPOFOL;  Surgeon: Carol Ada, MD;  Location: [facility] ENDOSCOPY;  Service: Endoscopy;  Laterality: N/A;  . ESOPHAGOGASTRODUODENOSCOPY N/A 11/18/2017   Procedure: ESOPHAGOGASTRODUODENOSCOPY (EGD);  Surgeon: Carol Ada, MD;  Location: [facility] ENDOSCOPY;  Service: Endoscopy;  Laterality: N/A;  . EYE SURGERY Bilateral    cataracts removed, lens placed  . JOINT REPLACEMENT    . KNEE CARTILAGE SURGERY    . PARTIAL COLECTOMY    . RIGHT/LEFT HEART CATH AND CORONARY ANGIOGRAPHY N/A 12/28/2016   Procedure: Right/Left Heart Cath and Coronary Angiography;  Surgeon: Belva Crome, MD;  Location: [facility] CV LAB;  Service: Cardiovascular;  Laterality: N/A;  . TEE WITHOUT CARDIOVERSION N/A 03/15/2017   Procedure: TRANSESOPHAGEAL ECHOCARDIOGRAM (TEE);  Surgeon: Burnell Blanks, MD;  Location: [facility];  Service: Open Heart Surgery;  Laterality: N/A;  . TONSILLECTOMY AND ADENOIDECTOMY  1942  . TOTAL KNEE ARTHROPLASTY Right 11/08/2017  . TOTAL KNEE ARTHROPLASTY Right 11/08/2017   Procedure: TOTAL KNEE ARTHROPLASTY;  Surgeon: Melrose Nakayama, MD;  Location: [facility];  Service: Orthopedics;  Laterality: Right;  . TRANSCATHETER AORTIC VALVE REPLACEMENT, TRANSFEMORAL N/A 03/15/2017   Procedure: TRANSCATHETER AORTIC VALVE REPLACEMENT, TRANSFEMORAL;  Surgeon: Burnell Blanks, MD;  Location: [place] [facility];  Service: Open Heart Surgery;  Laterality: N/A;  . VEIN SURGERY  1980       Family History  Problem Relation Age of Onset  . Heart failure Father   . Emphysema Father   . COPD Father   . Early death Father   . Heart attack Father   . Hypertension Mother     . Early death Mother   . Hyperlipidemia Mother   . Stroke Mother   . Cancer Sister   . Hypertension Sister   . Healthy Sister     Social History   Tobacco Use  . Smoking status: Never Smoker  . Smokeless tobacco: Never Used  Substance Use Topics  . Alcohol use: No  . Drug use: No    Home Medications Prior to Admission medications   Medication Sig Start Date End Date Taking? Authorizing Provider  ACCU-CHEK AVIVA PLUS test strip USE AS INSTRUCTED TO CHECK BLOOD SUGAR TWICE A DAY DX:E11.65 08/05/19  Yes [name], MD  acetaminophen (TYLENOL) 500 MG tablet Take 500 mg by mouth every 4 (four) hours as needed for moderate pain or headache.   Yes [provider]  amiodarone (PACERONE) 200 MG tablet Take 0.5 tablets (100 mg total) by mouth daily. 06/15/19  Yes Allred, Jeneen Rinks, MD  apixaban (ELIQUIS) 2.5 MG TABS tablet Take 1 tablet (2.5 mg total) by mouth 2 (two) times daily. 01/15/19  Yes Belva Crome, MD  BD INSULIN SYRINGE U/F 31G X 5/16" 1 ML MISC USE TO INJECT INSULIN DAILY 03/13/19  Yes [name], MD  Cholecalciferol (VITAMIN D) 50 MCG (2000 UT) tablet Take 2,000 Units by mouth daily.   Yes [provider]  ferrous sulfate 325 (65 FE) MG tablet Take 325 mg by mouth 2 (two) times daily with a meal.    Yes [provider]  furosemide (LASIX) 40 MG tablet Take two tablets (80mg  total) by mouth every morning.  Take one tablet by mouth every afternoon. Patient taking differently: Take 40-80 mg by mouth 2 (two) times daily. Take 80mg  in the morning and 40mg  in the afternoon per patient 09/10/19  Yes Belva Crome, MD  glimepiride (AMARYL) 2 MG tablet TAKE 1 TABLET before breakfast daily Patient taking differently: Take 2 mg by mouth daily.  07/09/19  Yes [name], MD  insulin lispro (HUMALOG KWIKPEN) 100 UNIT/ML KwikPen Inject 12 Units into the skin daily. Inject 12 units under the skin before the main meal of the day.   Yes [provider]  insulin NPH  Human (HUMULIN N) 100 UNIT/ML injection Inject 26 units of Humulin N under the skin once daily at bedtime. Patient taking differently: Inject 26 Units into the skin at bedtime.  01/11/19  Yes [name], MD  Insulin Pen Needle (B-D UF III MINI PEN NEEDLES) 31G X 5 MM MISC USE 2 PEN NEEDLE PER DAY WITH HUMALOG AND VICTOZA 05/17/19  Yes [name], MD  metolazone (ZAROXOLYN) 5 MG tablet Taken on tablet by mouth daily on Monday and Thursday.  Take 30 minutes prior to Furosemide. Patient taking differently: Take 5 mg by mouth See admin instructions. Taken one 5mg   tablet by mouth daily on Monday and Thursday.  Take 30 minutes prior to Furosemide. 09/10/19  Yes Belva Crome, MD  Multiple Vitamin (MULTIVITAMIN WITH MINERALS) TABS tablet Take 1 tablet by mouth daily. Mens One a day Vit   Yes [provider]  Multiple Vitamins-Minerals (PRESERVISION AREDS 2 PO) Take 1 capsule by mouth 2 (two) times daily.   Yes [provider]  olmesartan (BENICAR) 20 MG tablet TAKE 1 TABLET BY MOUTH EVERY DAY Patient taking differently: Take 20 mg by mouth daily.  04/23/19  Yes Belva Crome, MD  pantoprazole (PROTONIX) 40 MG tablet Take 1 tablet (40 mg total) by mouth 2 (two) times daily. 11/13/18  Yes Burtis Junes, NP  pravastatin (PRAVACHOL) 80 MG tablet Take 1 tablet (80 mg total) by mouth every evening. 09/26/19  Yes Kuneff, Renee A, DO  tamsulosin (FLOMAX) 0.4 MG CAPS capsule Take 1 capsule daily Patient taking differently: Take 0.4 mg by mouth every evening.  09/26/19  Yes Kuneff, Renee A, DO  VICTOZA 18 MG/3ML SOPN INJECT 1.8 MG UNDER THE SKIN AT BEDTIME Patient taking differently: Inject 1.8 mg into the skin at bedtime. INJECT 1.8 MG UNDER THE SKIN AT BEDTIME 09/17/19  Yes [name], MD  vitamin C (ASCORBIC ACID) 500 MG tablet Take 500 mg by mouth daily.   Yes [provider]    Allergies    Bactrim [sulfamethoxazole-trimethoprim] and Morphine and related  Review of Systems   Review  of Systems  Neurological: Positive for syncope.  All other systems reviewed and are negative.   Physical Exam Updated Vital Signs BP 136/75   Pulse 73   Resp 15   Ht 5\' 8"  (1.727 m)   Wt 99.8 kg   SpO2 98%  BMI 33.45 kg/m   Physical Exam Vitals and nursing note reviewed.  Constitutional:      General: He is not in acute distress.    Appearance: He is well-developed.     Comments: Elderly male who appears nontoxic  HENT:     Head: Normocephalic and atraumatic.  Eyes:     Extraocular Movements: Extraocular movements intact.     Conjunctiva/sclera: Conjunctivae normal.     Pupils: Pupils are equal, round, and reactive to light.  Cardiovascular:     Rate and Rhythm: Normal rate. Rhythm irregular.     Pulses: Normal pulses.     Comments: Irregularly irregular Pulmonary:     Effort: Pulmonary effort is normal. No respiratory distress.     Breath sounds: Normal breath sounds. No wheezing.  Abdominal:     General: There is no distension.     Palpations: Abdomen is soft. There is no mass.     Tenderness: There is no abdominal tenderness. There is no guarding or rebound.  Musculoskeletal:        General: Normal range of motion.     Cervical back: Normal range of motion and neck supple.  Skin:    General: Skin is warm and dry.     Capillary Refill: Capillary refill takes less than 2 seconds.  Neurological:     Mental Status: He is alert and oriented to person, place, and time.     ED Results / Procedures / Treatments   Labs (all labs ordered are listed, but only abnormal results are displayed) Labs Reviewed  CBC WITH DIFFERENTIAL/PLATELET - Abnormal; Notable for the following components:      Result Value   RBC 3.69 (*)    Hemoglobin 11.6 (*)    HCT 35.7 (*)    Platelets 132 (*)    All other components within normal limits  COMPREHENSIVE METABOLIC PANEL - Abnormal; Notable for the following components:   CO2 19 (*)    Glucose, Bld 223 (*)    BUN 90 (*)     Creatinine, Ser 2.57 (*)    Calcium 8.8 (*)    GFR calc non Af Amer 22 (*)    GFR calc Af Amer 26 (*)    Anion gap 17 (*)    All other components within normal limits  URINALYSIS, ROUTINE W REFLEX MICROSCOPIC - Abnormal; Notable for the following components:   APPearance HAZY (*)    All other components within normal limits  BRAIN NATRIURETIC PEPTIDE - Abnormal; Notable for the following components:   B Natriuretic Peptide 125.0 (*)    All other components within normal limits  CBG MONITORING, ED - Abnormal; Notable for the following components:   Glucose-Capillary 201 (*)    All other components within normal limits  TROPONIN I (HIGH SENSITIVITY) - Abnormal; Notable for the following components:   Troponin I (High Sensitivity) 38 (*)    All other components within normal limits  SARS CORONAVIRUS 2 (TAT 6-24 HRS)  MAGNESIUM  BASIC METABOLIC PANEL  POC OCCULT BLOOD, ED  TROPONIN I (HIGH SENSITIVITY)    EKG EKG Interpretation  Date/Time:  Wednesday October 24 2019 17:12:50 EST Ventricular Rate:  52 PR Interval:    QRS Duration: 191 QT Interval:  490 QTC Calculation: 456 R Axis:   -49 Text Interpretation: Atrial fibrillation Ventricular premature complex Right bundle branch block Left ventricular hypertrophy similar to prior 1/21 Confirmed by Aletta Edouard [phone]) on 10/24/2019 6:31:19 PM   Radiology DG Chest Portable  1 View  Result Date: 10/24/2019 CLINICAL DATA:  Presyncope EXAM: PORTABLE CHEST 1 VIEW COMPARISON:  03/28/2018 chest radiograph. FINDINGS: Aortic valve prosthesis in place. Stable cardiomediastinal silhouette with mild cardiomegaly. No pneumothorax. No pleural effusion. Lungs appear clear, with no acute consolidative airspace disease and no pulmonary edema. IMPRESSION: Mild cardiomegaly without pulmonary edema. No active pulmonary disease. Electronically Signed   By: Ilona Sorrel M.D.   On: 10/24/2019 18:26    Procedures Procedures (including critical care  time)  Medications Ordered in ED Medications  sodium chloride flush (NS) 0.9 % injection 3 mL (has no administration in time range)  acetaminophen (TYLENOL) tablet 650 mg (has no administration in time range)    Or  acetaminophen (TYLENOL) suppository 650 mg (has no administration in time range)  ondansetron (ZOFRAN) tablet 4 mg (has no administration in time range)    Or  ondansetron (ZOFRAN) injection 4 mg (has no administration in time range)  sodium chloride 0.9 % bolus 500 mL (0 mLs Intravenous Stopped 10/24/19 2101)    ED Course  I have reviewed the triage vital signs and the nursing notes.  Pertinent labs & imaging results that were available during my care of the patient were reviewed by me and considered in my medical decision making (see chart for details).  Clinical Course as of Oct 23 2099  Wed Feb 17, 986  [id number] 86 year old male with history of A. fib here after a possible presyncopal event.  His initial blood pressure was low on arrival.  Currently he denies any complaints.  Lab work showing mild AKI.  He had a transient drop in heart rate down into the high 30s.  May need to be admitted for further hydration and cardiac monitoring.   [MB]    Clinical Course User Index [MB] Hayden Rasmussen, MD   MDM Rules/Calculators/A&P                      Patient presenting for evaluation of presyncopal feeling and hypotension.  Physical exam shows patient appears nontoxic.  He does have a history of anemia, concern for blood loss in the setting of taking blood thinner.  Cardiac history, concern for CAD.  Patient is in A. fib, but without RVR.  Will obtain labs, EKG, chest x-ray.  Labs interpreted by me.  Mild AKI, BUN elevated at 90.  Will hydrate gently, in the setting of CHF will limit bolus infusion.  Hemoglobin is stable. Troponin mildly elevated, no baseline to compare. ekg shows afib without rvr.  This x-ray viewed interpreted by me, shows cardiomegaly without obvious  effusion.  Patient does not appear fluid overloaded.  Will trend troponin and reassess.  Patient had approximately 3-minute episode of bradycardia with heart rate in the 30s.  In the setting of intermittent bradycardia, presyncopal feeling, and mild dehydration, concern for patient's ability to go home safely.  Discussed with attending, Dr. Melina Copa evaluated the patient.  Will call for admission.  Discussed with Dr. Alcario Drought from [facility] hospitalist service, patient to be admitted.  Requesting cardiology consultation.  Discussed with Dr. Hassell Done from cardiology, they will evaluate the pt and put in recommendations.   Final Clinical Impression(s) / ED Diagnoses Final diagnoses:  Near syncope  Bradycardia  Atrial fibrillation, unspecified type [facility])    Rx / DC Orders ED Discharge Orders    None       Franchot Heidelberg, PA-C 10/24/19 2101    Hayden Rasmussen, MD 10/25/19 [phone]

## 2019-10-24 NOTE — ED Triage Notes (Signed)
Pt was outside raking up leaves when his wife came out to bring him some water, the wife noticed he was very pale and weak. The wife called fire rescue and upon their arrival pt was pale with a BP of 98/55.

## 2019-10-25 ENCOUNTER — Inpatient Hospital Stay (HOSPITAL_COMMUNITY)
Admission: EM | Disposition: A | Discharge status: Home / Self Care | Payer: Self-pay | Source: Home / Self Care | Attending: Internal Medicine

## 2019-10-25 DIAGNOSIS — I4819 Other persistent atrial fibrillation: Secondary | ICD-10-CM

## 2019-10-25 DIAGNOSIS — I453 Trifascicular block: Secondary | ICD-10-CM

## 2019-10-25 DIAGNOSIS — I495 Sick sinus syndrome: Principal | ICD-10-CM

## 2019-10-25 HISTORY — PX: PACEMAKER IMPLANT: EP1218

## 2019-10-25 LAB — BASIC METABOLIC PANEL
Anion gap: 12 (ref 5–15)
BUN: 81 mg/dL — ABNORMAL HIGH (ref 8–23)
CO2: 22 mmol/L (ref 22–32)
Calcium: 8.8 mg/dL — ABNORMAL LOW (ref 8.9–10.3)
Chloride: 104 mmol/L (ref 98–111)
Creatinine, Ser: 2.19 mg/dL — ABNORMAL HIGH (ref 0.61–1.24)
GFR calc Af Amer: 31 mL/min — ABNORMAL LOW (ref >60)
GFR calc non Af Amer: 27 mL/min — ABNORMAL LOW (ref >60)
Glucose, Bld: 153 mg/dL — ABNORMAL HIGH (ref 70–99)
Potassium: 4.5 mmol/L (ref 3.5–5.1)
Sodium: 138 mmol/L (ref 135–145)

## 2019-10-25 LAB — GLUCOSE, CAPILLARY
Glucose-Capillary: 123 mg/dL — ABNORMAL HIGH (ref 70–99)
Glucose-Capillary: 156 mg/dL — ABNORMAL HIGH (ref 70–99)
Glucose-Capillary: 269 mg/dL — ABNORMAL HIGH (ref 70–99)
Glucose-Capillary: 145 mg/dL — ABNORMAL HIGH (ref 70–99)

## 2019-10-25 LAB — OCCULT BLOOD X 1 CARD TO LAB, STOOL: Fecal Occult Bld: NEGATIVE

## 2019-10-25 LAB — SARS CORONAVIRUS 2 (TAT 6-24 HRS): SARS Coronavirus 2: NEGATIVE

## 2019-10-25 SURGERY — PACEMAKER IMPLANT

## 2019-10-25 MED ORDER — SODIUM CHLORIDE 0.9 % IV SOLN
80.0000 mg | INTRAVENOUS | Status: AC
  Administered 2019-10-25: 80 mg
  Filled 2019-10-25: qty 2

## 2019-10-25 MED ORDER — HEPARIN (PORCINE) IN NACL 1000-0.9 UT/500ML-% IV SOLN
INTRAVENOUS | Status: AC
  Filled 2019-10-25: qty 500

## 2019-10-25 MED ORDER — CEFAZOLIN SODIUM-DEXTROSE 2-4 GM/100ML-% IV SOLN
2.0000 g | INTRAVENOUS | Status: DC
  Filled 2019-10-25: qty 100

## 2019-10-25 MED ORDER — FENTANYL CITRATE (PF) 100 MCG/2ML IJ SOLN
INTRAMUSCULAR | Status: DC | PRN
  Administered 2019-10-25: 12.5 ug via INTRAVENOUS

## 2019-10-25 MED ORDER — ONDANSETRON HCL 4 MG/2ML IJ SOLN: 4.0000 mg | Freq: Four times a day (QID) | INTRAMUSCULAR | Status: DC | PRN

## 2019-10-25 MED ORDER — LIDOCAINE HCL 1 % IJ SOLN
INTRAMUSCULAR | Status: AC
  Filled 2019-10-25: qty 60

## 2019-10-25 MED ORDER — CHLORHEXIDINE GLUCONATE 4 % EX LIQD: 60.0000 mL | Freq: Once | CUTANEOUS | Status: DC

## 2019-10-25 MED ORDER — SODIUM CHLORIDE 0.9% FLUSH
3.0000 mL | Freq: Two times a day (BID) | INTRAVENOUS | Status: DC
  Administered 2019-10-25: 3 mL via INTRAVENOUS

## 2019-10-25 MED ORDER — CEFAZOLIN SODIUM-DEXTROSE 2-4 GM/100ML-% IV SOLN
INTRAVENOUS | Status: AC
  Filled 2019-10-25: qty 100

## 2019-10-25 MED ORDER — CEFAZOLIN SODIUM-DEXTROSE 1-4 GM/50ML-% IV SOLN
1.0000 g | Freq: Two times a day (BID) | INTRAVENOUS | Status: DC
  Administered 2019-10-25: 1 g via INTRAVENOUS
  Filled 2019-10-25 (×3): qty 50

## 2019-10-25 MED ORDER — SODIUM CHLORIDE 0.9 % IV SOLN
INTRAVENOUS | Status: AC
  Filled 2019-10-25: qty 2

## 2019-10-25 MED ORDER — LIDOCAINE HCL (PF) 1 % IJ SOLN
INTRAMUSCULAR | Status: DC | PRN
  Administered 2019-10-25: 28 mL

## 2019-10-25 MED ORDER — INSULIN ASPART 100 UNIT/ML ~~LOC~~ SOLN
0.0000 [IU] | Freq: Three times a day (TID) | SUBCUTANEOUS | Status: DC
  Administered 2019-10-25: 5 [IU] via SUBCUTANEOUS
  Administered 2019-10-25 – 2019-10-26 (×2): 1 [IU] via SUBCUTANEOUS

## 2019-10-25 MED ORDER — SODIUM CHLORIDE 0.9 % IV SOLN: 250.0000 mL | INTRAVENOUS | Status: DC

## 2019-10-25 MED ORDER — SODIUM CHLORIDE 0.9 % IV SOLN: INTRAVENOUS | Status: DC

## 2019-10-25 MED ORDER — CEFAZOLIN SODIUM-DEXTROSE 2-3 GM-%(50ML) IV SOLR
INTRAVENOUS | Status: AC | PRN
  Administered 2019-10-25: 2 g via INTRAVENOUS

## 2019-10-25 MED ORDER — MIDAZOLAM HCL 5 MG/5ML IJ SOLN
INTRAMUSCULAR | Status: DC | PRN
  Administered 2019-10-25: 1 mg via INTRAVENOUS

## 2019-10-25 MED ORDER — FENTANYL CITRATE (PF) 100 MCG/2ML IJ SOLN
INTRAMUSCULAR | Status: AC
  Filled 2019-10-25: qty 2

## 2019-10-25 MED ORDER — SODIUM CHLORIDE 0.9% FLUSH: 3.0000 mL | INTRAVENOUS | Status: DC | PRN

## 2019-10-25 MED ORDER — MIDAZOLAM HCL 5 MG/5ML IJ SOLN
INTRAMUSCULAR | Status: AC
  Filled 2019-10-25: qty 5

## 2019-10-25 MED ORDER — HEPARIN (PORCINE) IN NACL 1000-0.9 UT/500ML-% IV SOLN
INTRAVENOUS | Status: DC | PRN
  Administered 2019-10-25: 500 mL

## 2019-10-25 MED ORDER — ACETAMINOPHEN 325 MG PO TABS
325.0000 mg | ORAL_TABLET | ORAL | Status: DC | PRN
  Administered 2019-10-25 – 2019-10-26 (×3): 650 mg via ORAL
  Filled 2019-10-25 (×3): qty 2

## 2019-10-25 SURGICAL SUPPLY — 12 items
CABLE SURGICAL S-101-97-12 (CABLE) ×3 IMPLANT
CATH RIGHTSITE C315HIS02 (CATHETERS) ×2 IMPLANT
IPG PACE AZUR XT DR MRI W1DR01 (Pacemaker) IMPLANT
LEAD CAPSURE NOVUS 5076-52CM (Lead) ×2 IMPLANT
LEAD SELECT SECURE 3830 383069 (Lead) IMPLANT
PACE AZURE XT DR MRI W1DR01 (Pacemaker) ×3 IMPLANT
PAD PRO RADIOLUCENT 2001M-C (PAD) ×3 IMPLANT
SELECT SECURE 3830 383069 (Lead) ×3 IMPLANT
SHEATH 7FR PRELUDE SNAP 13 (SHEATH) ×4 IMPLANT
SLITTER 6232ADJ (MISCELLANEOUS) ×2 IMPLANT
TRAY PACEMAKER INSERTION (PACKS) ×3 IMPLANT
WIRE HI TORQ VERSACORE-J 145CM (WIRE) ×2 IMPLANT

## 2019-10-25 NOTE — Progress Notes (Signed)
On call cardiology page for Pt's HR dipping into 40's. Waiting response.

## 2019-10-25 NOTE — Consult Note (Addendum)
Cardiology Consultation:   Patient ID: Todd Romero MRN: [id number]; DOB: [birth year]  Admit date: 10/24/2019 Date of Consult: 10/25/2019  Primary Care Provider: Ma Hillock, DO Primary Cardiologist: Sinclair Grooms, MD  Primary Electrophysiologist:  Dr. [facility]   Patient Profile:   Todd Romero is a 86 y.o. 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 Glendell Docker.   AFib hx Diagnosed 2019 presenting with CHF diagnosis  Referred to Dr. Rayann Heman April 2020 after an event monitor noted rate controlled AFib (100%) He felt at that time 2/2 comorbidities AAD options limited to amiodarone and started  with plans to DCCV a few weeks later Felt given severe OSA and non-compliance with CPAP maintaining SR would prove difficult 01/18/2019 DCCV yst shock to flutter 2nd to SR Since then following in AFib clinic, his amio reduced to 100mg  daily   History of Present Illness:   Todd Romero saw the AFib clinic 09/24/2019, was doing well, maintaining SR by lack of AF symptoms, was SR 63, 1st degree AVblock at that time, no changes were made  He came to [facility] yesterday with near syncope and low BPs. He reported working out in his yard, and upon standing felt near syncopal, EMS found with SBP 98, and in the ER bradycardic 30's His BUN 90,Creat 2.9 (baseline 1.8) and given IVF Admitted to medicine, his home amio held as well as his ACE and diuretics  Cardiology consulted suspect need for PPM  The patient  Reports that since being on the amiodarone his HR in general is slower then it used to be, when checking his BP at home, his HR readings waver between 50-70's Yesterday he reported out in the yard picking up branches, had some back pain, went in and took tylenol, went back out a while later and once when getting up he  felt like he was going to faint. No CP, no palpitations  LABS K+ 4.9 Mag 2.1 BUN/Creat 90/2.57 > 81/2.19  (baseline about 1.8) HS Trop 38 > 40 WBC 10.3 H/H 11/35 Plts 132 BNP 125  COVID negative   Heart Pathway Score:     Past Medical History:  Diagnosis Date  . Anemia   . Arthritis   . CAD (coronary artery disease)    a. cardiac cath 12/2016 showing moderate AS, elevated LVEDP, heavy 3V coronary calcification, 100% mD2, 30-50% LAD, 50-90% stenosis of Cx proximal to origin of L-PDA, RCA not engaged due to poor catheter control (difficult procedure) - coronary status was essentially unchanged from prior.  . Chicken pox   . Chronic diastolic CHF (congestive heart failure) ([place])   . Colon polyps   . Degenerative arthritis   . Diabetes ([place])   . Dyspnea   . Essential hypertension   . Heart disease   . Heme positive stool 11/16/2017  . Hyperlipidemia   . New onset a-fib ([place]) 03/04/2018  . Obesity (BMI 30-39.9) 06/18/2015  . OSA (obstructive sleep apnea)    Severe with AHI 27/hr now on CPAP  not compliant with treatment.  . Peritonitis ([place]) 1985?  [name] Pneumonia    "6 months - 86 years old"  . Pure hypercholesterolemia   . RBBB   . [facility] spotted fever   . 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 Severe aortic stenosis    a. s/p TAVR 03/2017.  . Type II or unspecified type diabetes mellitus without mention of complication, not stated as uncontrolled   . Varicose vein of leg     Past Surgical History:  Procedure Laterality Date  . APPENDECTOMY  1985   peritonitis  . CARDIOVERSION N/A 04/03/2018   Procedure: CARDIOVERSION;  Surgeon: Josue Hector, MD;  Location: [facility] ENDOSCOPY;  Service: Cardiovascular;  Laterality: N/A;  . CARDIOVERSION N/A 01/18/2019   Procedure: CARDIOVERSION;  Surgeon: Skeet Latch, MD;  Location: [place];   Service: Cardiovascular;  Laterality: N/A;  . CARPAL TUNNEL RELEASE    . COLONOSCOPY WITH PROPOFOL N/A 11/18/2017   Procedure: COLONOSCOPY WITH PROPOFOL;  Surgeon: Carol [place], MD;  Location: [facility] ENDOSCOPY;  Service: Endoscopy;  Laterality: N/A;  . ESOPHAGOGASTRODUODENOSCOPY N/A 11/18/2017   Procedure: ESOPHAGOGASTRODUODENOSCOPY (EGD);  Surgeon: Carol [place], MD;  Location: [facility];  Service: Endoscopy;  Laterality: N/A;  . EYE SURGERY Bilateral    cataracts removed, lens placed  . JOINT REPLACEMENT    . KNEE CARTILAGE SURGERY    . PARTIAL COLECTOMY    . RIGHT/LEFT HEART CATH AND CORONARY ANGIOGRAPHY N/A 12/28/2016   Procedure: Right/Left Heart Cath and Coronary Angiography;  Surgeon: Belva Crome, MD;  Location: [place] CV LAB;  Service: Cardiovascular;  Laterality: N/A;  . TEE WITHOUT CARDIOVERSION N/A 03/15/2017   Procedure: TRANSESOPHAGEAL ECHOCARDIOGRAM (TEE);  Surgeon: Burnell Blanks, MD;  Location: [place];  Service: Open Heart Surgery;  Laterality: N/A;  . TONSILLECTOMY AND ADENOIDECTOMY  1942  . TOTAL KNEE ARTHROPLASTY Right 11/08/2017  . TOTAL KNEE ARTHROPLASTY Right 11/08/2017   Procedure: TOTAL KNEE ARTHROPLASTY;  Surgeon: Melrose Nakayama, MD;  Location: [place];  Service: Orthopedics;  Laterality: Right;  . TRANSCATHETER AORTIC VALVE REPLACEMENT, TRANSFEMORAL N/A 03/15/2017   Procedure: TRANSCATHETER AORTIC VALVE REPLACEMENT, TRANSFEMORAL;  Surgeon: Burnell Blanks, MD;  Location: [place];  Service: Open Heart Surgery;  Laterality: N/A;  . VEIN SURGERY  1980     Home Medications:  Prior to Admission medications   Medication Sig Start Date End Date Taking? Authorizing Provider  ACCU-CHEK AVIVA PLUS test strip USE AS INSTRUCTED TO CHECK BLOOD SUGAR TWICE A DAY DX:E11.65 08/05/19  Yes [name], MD  acetaminophen (TYLENOL) 500 MG tablet Take 500 mg by mouth every 4 (four) hours as needed for moderate pain or headache.   Yes [provider]  amiodarone  (PACERONE) 200 MG tablet Take 0.5 tablets (100 mg total) by mouth daily. 06/15/19  Yes Allred, Jeneen Rinks, MD  apixaban (ELIQUIS) 2.5 MG TABS tablet Take 1 tablet (2.5 mg total) by mouth 2 (two) times daily. 01/15/19  Yes Belva Crome, MD  BD INSULIN SYRINGE U/F 31G X 5/16" 1 ML MISC USE TO INJECT INSULIN DAILY 03/13/19  Yes [name], MD  Cholecalciferol (VITAMIN D) 50 MCG (2000 UT) tablet Take 2,000 Units by mouth daily.   Yes [provider]  ferrous sulfate 325 (65 FE) MG tablet Take 325 mg by mouth 2 (two) times daily with a meal.    Yes [provider]  furosemide (LASIX) 40 MG tablet Take two tablets (80mg  total) by mouth every morning.  Take one tablet by mouth every afternoon. Patient taking differently: Take 40-80 mg by mouth 2 (two) times daily. Take 80mg  in the morning and 40mg  in  the afternoon per patient 09/10/19  Yes Belva Crome, MD  glimepiride (AMARYL) 2 MG tablet TAKE 1 TABLET before breakfast daily Patient taking differently: Take 2 mg by mouth daily.  07/09/19  Yes [name], MD  insulin lispro (HUMALOG KWIKPEN) 100 UNIT/ML KwikPen Inject 12 Units into the skin daily. Inject 12 units under the skin before the main meal of the day.   Yes [provider]  insulin NPH Human (HUMULIN N) 100 UNIT/ML injection Inject 26 units of Humulin N under the skin once daily at bedtime. Patient taking differently: Inject 26 Units into the skin at bedtime.  01/11/19  Yes [name], MD  Insulin Pen Needle (B-D UF III MINI PEN NEEDLES) 31G X 5 MM MISC USE 2 PEN NEEDLE PER DAY WITH HUMALOG AND VICTOZA 05/17/19  Yes [name], MD  metolazone (ZAROXOLYN) 5 MG tablet Taken on tablet by mouth daily on Monday and Thursday.  Take 30 minutes prior to Furosemide. Patient taking differently: Take 5 mg by mouth See admin instructions. Taken one 5mg   tablet by mouth daily on Monday and Thursday.  Take 30 minutes prior to Furosemide. 09/10/19  Yes Belva Crome, MD  Multiple Vitamin  (MULTIVITAMIN WITH MINERALS) TABS tablet Take 1 tablet by mouth daily. Mens One a day Vit   Yes [provider]  Multiple Vitamins-Minerals (PRESERVISION AREDS 2 PO) Take 1 capsule by mouth 2 (two) times daily.   Yes [provider]  olmesartan (BENICAR) 20 MG tablet TAKE 1 TABLET BY MOUTH EVERY DAY Patient taking differently: Take 20 mg by mouth daily.  04/23/19  Yes Belva Crome, MD  pantoprazole (PROTONIX) 40 MG tablet Take 1 tablet (40 mg total) by mouth 2 (two) times daily. 11/13/18  Yes Burtis Junes, NP  pravastatin (PRAVACHOL) 80 MG tablet Take 1 tablet (80 mg total) by mouth every evening. 09/26/19  Yes Kuneff, Renee A, DO  tamsulosin (FLOMAX) 0.4 MG CAPS capsule Take 1 capsule daily Patient taking differently: Take 0.4 mg by mouth every evening.  09/26/19  Yes Kuneff, Renee A, DO  VICTOZA 18 MG/3ML SOPN INJECT 1.8 MG UNDER THE SKIN AT BEDTIME Patient taking differently: Inject 1.8 mg into the skin at bedtime. INJECT 1.8 MG UNDER THE SKIN AT BEDTIME 09/17/19  Yes [name], MD  vitamin C (ASCORBIC ACID) 500 MG tablet Take 500 mg by mouth daily.   Yes [provider]    Inpatient Medications: Scheduled Meds: . apixaban  2.5 mg Oral BID  . cholecalciferol  2,000 Units Oral Daily  . ferrous sulfate  325 mg Oral BID WC  . insulin aspart  0-9 Units Subcutaneous TID WC  . insulin NPH Human  10 Units Subcutaneous QHS  . pantoprazole  40 mg Oral BID  . pravastatin  80 mg Oral QPM  . sodium chloride flush  3 mL Intravenous Q12H  . tamsulosin  0.4 mg Oral QPM   Continuous Infusions:  PRN Meds: acetaminophen **OR** acetaminophen, ondansetron **OR** ondansetron (ZOFRAN) IV  Allergies:    Allergies  Allergen Reactions  . Bactrim [Sulfamethoxazole-Trimethoprim] Nausea And Vomiting and Other (See Comments)    Bleeding and ulcers  . Morphine And Related Nausea And Vomiting    Social History:   Social History   Socioeconomic History  . Marital status:  Married    Spouse name: Not on file  . Number of children: 4  . Years of education: Not on file  . Highest education level: Not on  file  Occupational History  . Occupation: Construction-Retired  Tobacco Use  . Smoking status: Never Smoker  . Smokeless tobacco: Never Used  Substance and Sexual Activity  . Alcohol use: No  . Drug use: No  . Sexual activity: Not Currently    Partners: Female  Other Topics Concern  . 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 Difficulty of Paying Living Expenses: Not on file  Food Insecurity:   . Worried About Charity fundraiser in the Last Year: Not on file  . Ran Out of Food in the Last Year: Not on file  Transportation Needs:   . Lack of Transportation (Medical): Not on file  . Lack of Transportation (Non-Medical): Not on file  Physical Activity:   . Days of Exercise per Week: Not on file  . Minutes of Exercise per Session: Not on file  Stress:   . Feeling of Stress : Not on file  Social Connections:   . Frequency of Communication with Friends and Family: Not on file  . Frequency of Social Gatherings with Friends and Family: Not on file  . Attends Religious Services: Not on file  . Active Member of Clubs or Organizations: Not on file  . Attends Archivist Meetings: Not on file  . Marital Status: Not on file  Intimate Partner Violence:   . Fear of Current or Ex-Partner: Not on file  . Emotionally Abused: Not on file  . Physically Abused: Not on file  . Sexually Abused: Not on file    Family History:   Family History  Problem Relation Age of Onset  . Heart failure Father   . Emphysema Father   . COPD Father   . Early death Father   . Heart  attack Father   . Hypertension Mother   . Early death Mother   . Hyperlipidemia Mother   . Stroke Mother   . Cancer Sister   . Hypertension Sister   . Healthy Sister      ROS:  Please see the history of present illness.  All other ROS reviewed and negative.     Physical Exam/Data:   Vitals:   10/24/19 2300 10/24/19 2346 10/25/19 0401 10/25/19 0717  BP: 123/63 135/70 124/61 120/61  Pulse: 61 77 64 (!) 59  Resp: 11 18 18 18   Temp:  (!) 97.5 F (36.4 C) 98.5 F (36.9 C) 98.2 F (36.8 C)  TempSrc:  Oral Oral Oral  SpO2: 93% 99% 100% 99%  Weight:   103.4 kg   Height:  5\' 7"  (1.702 m)      Intake/Output Summary (Last 24 hours) at 10/25/2019 0943 Last data filed at 10/25/2019 0916 Gross per 24 hour  Intake 980 ml  Output 900 ml  Net 80 ml   Last 3 Weights 10/25/2019 10/24/2019 09/25/2019  Weight (lbs) 228 lb 220 lb 233 lb  Weight (kg) 103.42 kg 99.791 kg 105.688 kg     Body mass index is 35.71 kg/m.  General:  Well nourished, well developed, in no acute distress HEENT: normal Lymph: no adenopathy Neck: no JVD Endocrine:  No thryomegaly Vascular: No carotid bruits  Cardiac:   RRR; bradycardic, 1/6 SM, no gallops or rubsLungs:  clear to auscultation bilaterally, no wheezing, rhonchi or rales  Abd: soft, nontender, obese  Ext: trace if any edema Musculoskeletal:  No deformities Skin: warm and dry  Neuro:  No gross focal abnormalities noted Psych:  Normal affect   EKG:  The EKG was personally reviewed and demonstrates:    SB52bpm, 1st degree AVblock, 260ms, RBBB. LAD, PAC, PVC  09/24/2019 SR 53bpm, qst degree AVblock, PR 280-359ms, RBBB, LAD  Telemetry:  Telemetry was personally reviewed and demonstrates:   SB, generally 40's, was observed to have transient awake HR 30's in the ER  Relevant CV Studies:  09/13/2019 TTE IMPRESSIONS  1. Left ventricular ejection fraction, by visual estimation, is 35 to  40%. The left ventricle has moderately decreased function.  There is  moderately increased left ventricular hypertrophy.  2. Elevated left atrial pressure.  3. Left ventricular diastolic parameters are consistent with Grade II  diastolic dysfunction (pseudonormalization).  4. Global right ventricle has normal systolic function.The right  ventricular size is normal. No increase in right ventricular wall  thickness.  5. Left atrial size was moderately dilated.  6. Right atrial size was normal.  7. Moderate mitral annular calcification.  8. The mitral valve is abnormal. No evidence of mitral valve  regurgitation.  9. The tricuspid valve is normal in structure.  10. Bioprosthetic AVR. Aortic valve regurgitation is not visualized. Mean  gradient 9 mmHg, stable from prior TTE on 03/05/18  11. The pulmonic valve was not well visualized. Pulmonic valve  regurgitation is not visualized.  12. There is mild dilatation of the ascending aorta measuring 36 mm.  13. Mildly elevated pulmonary artery systolic pressure.  14. The tricuspid regurgitant velocity is 2.67 m/s, and with an assumed  right atrial pressure of 8 mmHg, the estimated right ventricular systolic  pressure is mildly elevated at 36.5 mmHg.  15. The inferior vena cava is dilated in size with >50% respiratory  variability, suggesting right atrial pressure of 8 mmHg.    03/05/2018: LVEF 43%   12/28/2016; LHC  Difficult procedure due to severe angulation in the right innominate/ascending aortic region that produced catheter resistance to torque and advancement.  Moderate aortic stenosis with a transvalvular gradient of 29 mmHg (mean) and 33-38 mmHg (peak to peak). Calculated aortic valve area 1.35 cm.  Elevated left ventricular end-diastolic pressure and pulmonary capillary wedge pressure. Known LV systolic function with EF greater than 50% by recent echo suggesting chronic diastolic left heart failure.  Heavy three-vessel coronary calcification.  100% stenosis in the mid body of the  second diagonal. There is diffuse 30-50% narrowing in the LAD.  The circumflex coronary is dominant. Moderate to heavy calcification is noted throughout the distal vessel and in the proximal segment. There is eccentric 50-90% stenosis proximal to the origin of the L-PDA.  Nondominant right coronary. Not selectively engaged due to poor catheter control. The vessel is patent and supplies only the right ventricle.  RECOMMENDATIONS:   Coronary status is essentially unchanged compared to 5 years ago.  Moderate aortic stenosis based upon hemodynamics obtained from this case. This matches the recent echo.  Explanation for exertional fatigue is uncertain. Consider referral to the valve clinic for a second opinion concerning management of aortic stenosis. May need dobutamine echo as he is potentially a low gradient severe aortic stenosis substrate.  Laboratory Data:  High Sensitivity Troponin:   Recent Labs  Lab 10/24/19 1913 10/24/19 2152  TROPONINIHS  38* 40*     Chemistry Recent Labs  Lab 10/24/19 1720 10/25/19 0503  NA 135 138  K 4.9 4.5  CL 99 104  CO2 19* 22  GLUCOSE 223* 153*  BUN 90* 81*  CREATININE 2.57* 2.19*  CALCIUM 8.8* 8.8*  GFRNONAA 22* 27*  GFRAA 26* 31*  ANIONGAP 17* 12    Recent Labs  Lab 10/24/19 1720  PROT 6.8  ALBUMIN 4.0  AST 26  ALT 15  ALKPHOS 56  BILITOT 0.9   Hematology Recent Labs  Lab 10/24/19 1720  WBC 10.3  RBC 3.69*  HGB 11.6*  HCT 35.7*  MCV 96.7  MCH 31.4  MCHC 32.5  RDW 13.4  PLT 132*   BNP Recent Labs  Lab 10/24/19 1913  BNP 125.0*    DDimer No results for input(s): DDIMER in the last 168 hours.   Radiology/Studies:   DG Chest Portable 1 View Result Date: 10/24/2019 CLINICAL DATA:  Presyncope EXAM: PORTABLE CHEST 1 VIEW COMPARISON:  03/28/2018 chest radiograph. FINDINGS: Aortic valve prosthesis in place. Stable cardiomediastinal silhouette with mild cardiomegaly. No pneumothorax. No pleural effusion. Lungs appear  clear, with no acute consolidative airspace disease and no pulmonary edema. IMPRESSION: Mild cardiomegaly without pulmonary edema. No active pulmonary disease. Electronically Signed   By: Ilona Sorrel M.D.   On: 10/24/2019 18:26   {  Assessment and Plan:   1. 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 Lovena Le has seen and examined the patient Recommends PPM implant Discussed rational for this, discussed the procedure, potential risks and benefits The patient is agreeable to proceed  2. CAD     No anginal complaints     On statin, no ASA with Eliquis  3. CM (likely NICM)     No exam findings or symptoms to suggest volume OL currently     C/w Dr. Tamala Julian out patient     On ARB, I do not see BB, likely 2/2 baseline conduction system disease  4. Persistent Afib     CHA2DS2Vasc is 6, on Eliquis at home, appropriately at 2.5 for creat/age     Hold for PPM today      Maintaining SR on amio  5. VHD      S/p TAVR 2018  6. AKI/CKD     Follow, a component likely 2/2 bradycardia     C/w IM service  For questions or updates, please contact [place] Please consult [email or web address] for contact info under   Signed, Baldwin Jamaica, PA-C  10/25/2019 9:43 AM  EP Attending  Patient seen and examined. Agree with above. The patient has pauses and sinus node dysfunction with HR's into the 30's and near syncope. I have reviewed the indications/risks/benefits/goals/expectations of PPM insertion and he wishes to proceed.   Mikle Bosworth.D.

## 2019-10-25 NOTE — Progress Notes (Signed)
PROGRESS NOTE  Todd Romero QIH:[id number] DOB: 05-17-1934 DOA: 10/24/2019 PCP: Ma Hillock, DO  Brief History   Todd Romero is a 86 y.o. male with medical history significant of CAD, HFrEF 35-40% as of last month, AVS s/p TAVR, DM2, A.Fib on eliquis and amiodarone.  Patient states he was out working in the yard today. When he went to stand up at the end, he felt dizzy and like he was going to pass out. EMS was called, patient was found to be hypotensive with BP 98/55.  No further episodes of dizziness or weakness since then in ED.  No CP, no SOB, no N/V, no abd pain.  In ED had bradycardia down to the 30s for a 3 min long episode. Lab revealed BUN 90, creat 2.9 this up significantly from baseline of 1.8 just last month. He was given a 500 cc bolus.  [facility] were consulted to admit the patient for further evaluation and treatment.  He has been admitted to a telemetry bed, and cardiology has been consulted. The patient has been seen by Dr. Lovena Le. 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 He is chronically on amiodarone for atrial fibrillation.  Consultants  . Cardiology/EP  Procedures  . None  Antibiotics   Anti-infectives (From admission, onward)   Start     Dose/Rate Route Frequency Ordered Stop   10/25/19 2200  ceFAZolin (ANCEF) IVPB 1 g/50 mL premix     1 g 100 mL/hr over 30 Minutes Intravenous Every 12 hours 10/25/19 1316 10/27/19 0959   10/25/19 1133  ceFAZolin (ANCEF) IVPB 2 g/50 mL premix     over 30 Minutes  Continuous PRN 10/25/19 1151 10/25/19 1133   10/25/19 1100  gentamicin (GARAMYCIN) 80 mg in sodium chloride 0.9 % 500 mL irrigation     80 mg Irrigation On call 10/25/19 1056 10/25/19 1237   10/25/19 1100  ceFAZolin (ANCEF) IVPB 2g/100 mL premix  Status:  Discontinued     2 g 200 mL/hr over 30 Minutes Intravenous On call 10/25/19 1056 10/25/19 1309    .   Subjective  The  patient is awake, alert, and oriented x 3. No acute distress.  Objective   Vitals:  Vitals:   10/25/19 1316 10/25/19 1320  BP: 132/71 (!) 124/94  Pulse: 65 62  Resp: 20   Temp: 98.3 F (36.8 C)   SpO2: 100% 100%   Exam:  Constitutional:  . The patient is awake, alert, and oriented x 3. No acute distress. Respiratory:  . No increased work of breathing. . No wheezes, rales, or rhonchi . No tactile fremitus Cardiovascular:  . Regular rate and rhythm, but slow . No murmurs, ectopy, or gallups. . No lateral PMI. No thrills. Abdomen:  . Abdomen is soft, non-tender, non-distended . No hernias, masses, or organomegaly . Normoactive bowel sounds.  Musculoskeletal:  . No cyanosis, clubbing, or edema Skin:  . No rashes, lesions, ulcers . palpation of skin: no induration or nodules Neurologic:  . CN 2-12 intact . Sensation all 4 extremities intact Psychiatric:  . Mental status o Mood, affect appropriate o Orientation to person, place, time  . judgment and insight appear intact  I have personally reviewed the following:   Today's Data  . Vitals, BMP  Scheduled Meds: . chlorhexidine  60 mL Topical Once  . chlorhexidine  60 mL Topical Once  . cholecalciferol  2,000 Units Oral Daily  . ferrous sulfate  325 mg Oral BID WC  . insulin aspart  0-9 Units Subcutaneous TID WC  . insulin NPH Human  10 Units Subcutaneous QHS  . pantoprazole  40 mg Oral BID  . pravastatin  80 mg Oral QPM  . sodium chloride flush  3 mL Intravenous Q12H  . sodium chloride flush  3 mL Intravenous Q12H  . tamsulosin  0.4 mg Oral QPM   Continuous Infusions: .  ceFAZolin (ANCEF) IV      Principal Problem:   Symptomatic bradycardia Active Problems:   Type 2 diabetes mellitus (HCC)   Persistent atrial fibrillation (HCC)   Chronic combined systolic and diastolic CHF (congestive heart failure) (HCC)   Stage 3b chronic kidney disease   AKI (acute kidney injury) ([place])   LOS: 1 day   A & P    Near syncope: Due to bradycardia. The patient has been evaluated by EP/Cardiology. As his baseline conduction is a trifascicular block, and the patient is on amiodarone chronically for atrial fibrillation. He is currently in atrial fibrillation with a slow ventricular response. He is being monitored on telemetry. Amiodarone is being held for the time being. The patient will go for pacemaker placement this afternoon.   AKI on CKD stage 3b: Baseline creatinine appears to be about 1.8. Today it is 2.19 down from 2.57 on admission after receiving 500 cc bolus in ED. His ACE inhibitor has been held as are diuretics. Monitoring creatinine, electrolytes, and volume status. Avoid hypotension and nephrotoxic substances. Presumably this is pre-renal due to BP drop earlier today / bradycardia.  Chronic combined CHF: Holding diuretics as above. Monitor volume status.  PAF: Amiodarone has been held due to symptomatic bradycardia. Continue elilquis.  DM II: Victoza and amaryl have been held. He is receiving 10 units NPH qhs. Glucoses are being followed with FSBS and SSI.  I have seen and examined this patient myself. I have spent 35 minutes in his evaluation and care.  DVT prophylaxis: Eliquis Code Status: Full Family Communication: No family in room Disposition Plan: Home after admit  Zaydan Papesh, DO [facility] Direct contact: see [email or web address]  7PM-7AM contact night coverage as above 10/25/2019, 2:56 PM  LOS: 1 day

## 2019-10-25 NOTE — Discharge Instructions (Signed)
    Supplemental Discharge Instructions for  Pacemaker/Defibrillator Patients  Activity No heavy lifting or vigorous activity with your left/right arm for 6 to 8 weeks.  Do not raise your left/right arm above your head for one week.  Gradually raise your affected arm as drawn below.             10/29/2019                  10/30/2019                10/31/2019              11/01/2019 __  NO DRIVING for 1 week  ; you may begin driving on  8/46/6599   .  WOUND CARE - Keep the wound area clean and dry.  Do not get this area wet for one week. No showers for one week; you may shower on 11/01/2019    . - The tape/steri-strips on your wound will fall off; do not pull them off.  No bandage is needed on the site.  DO  NOT apply any creams, oils, or ointments to the wound area. - If you notice any drainage or discharge from the wound, any swelling or bruising at the site, or you develop a fever > 101? F after you are discharged home, call the office at once.  Special Instructions - You are still able to use cellular telephones; use the ear opposite the side where you have your pacemaker/defibrillator.  Avoid carrying your cellular phone near your device. - When traveling through airports, show security personnel your identification card to avoid being screened in the metal detectors.  Ask the security personnel to use the hand wand. - Avoid arc welding equipment, MRI testing (magnetic resonance imaging), TENS units (transcutaneous nerve stimulators).  Call the office for questions about other devices. - Avoid electrical appliances that are in poor condition or are not properly grounded. - Microwave ovens are safe to be near or to operate.

## 2019-10-25 NOTE — Progress Notes (Signed)
MD is aware of patient's bradycardia. Pt had an episode of HR in 30s in ED. Patient asymptomatic.

## 2019-10-25 NOTE — Plan of Care (Signed)
  Problem: Education: Goal: Knowledge of General Education information will improve Description Including pain rating scale, medication(s)/side effects and non-pharmacologic comfort measures Outcome: Progressing   

## 2019-10-26 ENCOUNTER — Inpatient Hospital Stay (HOSPITAL_COMMUNITY): Payer: Medicare Other

## 2019-10-26 LAB — CBC WITH DIFFERENTIAL/PLATELET
Abs Immature Granulocytes: 0.03 10*3/uL (ref 0.00–0.07)
Basophils Absolute: 0 10*3/uL (ref 0.0–0.1)
Basophils Relative: 0 %
Eosinophils Absolute: 0.1 10*3/uL (ref 0.0–0.5)
Eosinophils Relative: 1 %
HCT: 31.4 % — ABNORMAL LOW (ref 39.0–52.0)
Hemoglobin: 10.4 g/dL — ABNORMAL LOW (ref 13.0–17.0)
Immature Granulocytes: 0 %
Lymphocytes Relative: 17 %
Lymphs Abs: 1.3 10*3/uL (ref 0.7–4.0)
MCH: 31.6 pg (ref 26.0–34.0)
MCHC: 33.1 g/dL (ref 30.0–36.0)
MCV: 95.4 fL (ref 80.0–100.0)
Monocytes Absolute: 1 10*3/uL (ref 0.1–1.0)
Monocytes Relative: 13 %
Neutro Abs: 5.4 10*3/uL (ref 1.7–7.7)
Neutrophils Relative %: 69 %
Platelets: 134 10*3/uL — ABNORMAL LOW (ref 150–400)
RBC: 3.29 MIL/uL — ABNORMAL LOW (ref 4.22–5.81)
RDW: 13.4 % (ref 11.5–15.5)
WBC: 7.8 10*3/uL (ref 4.0–10.5)
nRBC: 0 % (ref 0.0–0.2)

## 2019-10-26 LAB — BASIC METABOLIC PANEL
Anion gap: 10 (ref 5–15)
BUN: 69 mg/dL — ABNORMAL HIGH (ref 8–23)
CO2: 22 mmol/L (ref 22–32)
Calcium: 8.9 mg/dL (ref 8.9–10.3)
Chloride: 108 mmol/L (ref 98–111)
Creatinine, Ser: 2.09 mg/dL — ABNORMAL HIGH (ref 0.61–1.24)
GFR calc Af Amer: 33 mL/min — ABNORMAL LOW (ref >60)
GFR calc non Af Amer: 28 mL/min — ABNORMAL LOW (ref >60)
Glucose, Bld: 145 mg/dL — ABNORMAL HIGH (ref 70–99)
Potassium: 4.2 mmol/L (ref 3.5–5.1)
Sodium: 140 mmol/L (ref 135–145)

## 2019-10-26 LAB — GLUCOSE, CAPILLARY: Glucose-Capillary: 129 mg/dL — ABNORMAL HIGH (ref 70–99)

## 2019-10-26 MED ORDER — AMIODARONE HCL 100 MG PO TABS
100.0000 mg | ORAL_TABLET | Freq: Every day | ORAL | Status: DC
  Administered 2019-10-26: 100 mg via ORAL
  Filled 2019-10-26: qty 1

## 2019-10-26 MED ORDER — FUROSEMIDE 40 MG PO TABS: 40.0000 mg | ORAL_TABLET | Freq: Two times a day (BID) | ORAL | 0 refills | Status: DC

## 2019-10-26 MED ORDER — APIXABAN 2.5 MG PO TABS: 2.5000 mg | ORAL_TABLET | Freq: Two times a day (BID) | ORAL | 11 refills | Status: DC

## 2019-10-26 MED FILL — Lidocaine HCl Local Inj 1%: INTRAMUSCULAR | Qty: 40 | Status: AC

## 2019-10-26 MED FILL — Lidocaine HCl Local Inj 1%: INTRAMUSCULAR | Qty: 20 | Status: AC

## 2019-10-26 NOTE — Progress Notes (Addendum)
During reassessment patient suggested discharge information be reviewed with wife as well.  1200; patient's wife at bedside. Reviewed AVS with patient and wife, medications reviewed, all questions answered.   Patient discharged for home via wheelchair with volunteer services at 1254. Discharged with AVS and pacemaker monitor for home. Wife present.

## 2019-10-26 NOTE — Discharge Summary (Signed)
Physician Discharge Summary  Maleik Vanderzee Buhrman LGX:[id number] DOB: May 29, 1934 DOA: 10/24/2019  PCP: Howard Pouch A, DO  Admit date: 10/24/2019 Discharge date: 10/26/2019  Recommendations for Outpatient Follow-up:  1. Discharge to home 2. Left arm restrictions as per cardiology instructions 3. Follow up with PCP in 7-10 days 4. Follow up with cardiology as directed. 5. Weigh yourself daily. Record weights.  Follow-up Information    [facility].   Specialty: Cardiology Why: 11/08/2019 @ 3:30PM, wound check visit Contact information: [address] [place]       Evans Lance, MD Follow up.   Specialty: Cardiology Why: 02/06/2020 @ 10:45AM Contact information: [address] [place] [address] [phone]        Howard Pouch A, DO.   Specialty: Family Medicine Why: Please follow up in a week Contact information: [address] [place]  [address] [phone]          Discharge Diagnoses: Principal diagnosis is #1 1. Near syncope 2. AKI on CKD 3b 3. Chronic combined CHG 4. Paroxysmal Atrial Fibrillation 5. DM II  Discharge Condition: Fair  Disposition: Home  Diet recommendation: Heart healthy  Filed Weights   10/24/19 1714 10/25/19 0401 10/26/19 0317  Weight: 99.8 kg 103.4 kg 101 kg    History of present illness:  Todd Romero is a 86 y.o. male with medical history significant of CAD, HFrEF 35-40% as of last month, AVS s/p TAVR, DM2, A.Fib on eliquis and amiodarone.  Patient states he was out working in the yard today. When he went to stand up at the end, he felt dizzy and like he was going to pass out. EMS was called, patient was found to be hypotensive with BP 98/55.  No further episodes of dizziness or weakness since then in ED.  No CP, no SOB, no N/V, no abd pain.  ED Course: In ED had bradycardia down to the 30s for a 3 min long  episode. Given 500cc bolus.  Hospital Course: Todd Romero a 86 y.o.Todd Romero medical history significant ofCAD, HFrEF 35-40% as of last month, AVS s/p TAVR, DM2, A.Fib on eliquis and amiodarone.  Patient states he was out working in the yard today. When he went to stand up at the end, he felt dizzy and like he was going to pass out. EMS was called, patient was found to be hypotensivewith BP 98/55.No further episodes of dizziness or weakness since then in ED. No CP, no SOB, no N/V, no abd pain.  In ED had bradycardia down to the 30s for a 3 min long episode. Lab revealed BUN 90, creat 2.9 this up significantly from baseline of 1.8 just last month. He was given a 500 cc bolus.  [facility] were consulted to admit the patient for further evaluation and treatment.  He has been admitted to a telemetry bed, and cardiology has been consulted. The patient has been seen by Dr. Lovena Le. 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 He is chronically on amiodarone for atrial fibrillation. He has been cleared for discharge to home by cardiology.  Today's assessment: S: The patient is resting comfortably. He is anxious to go home.  O: Vitals:  Vitals:   10/26/19 0317 10/26/19 0928  BP: 131/71 (!) 143/69  Pulse: (!) 59 71  Resp: 16   Temp: 98 F (36.7 C)   SpO2:  97% 99%   Exam:  Constitutional:   The patient is awake, alert, and oriented x 3. No acute distress. Respiratory:   No increased work of breathing.  No wheezes, rales, or rhonchi  No tactile fremitus Cardiovascular:   Regular rate and rhythm, but slow  No murmurs, ectopy, or gallups.  No lateral PMI. No thrills. Abdomen:   Abdomen is soft, non-tender, non-distended  No hernias, masses, or organomegaly  Normoactive bowel sounds.  Musculoskeletal:   No cyanosis, clubbing, or edema Skin:   No rashes, lesions, ulcers  palpation  of skin: no induration or nodules Neurologic:   CN 2-12 intact  Sensation all 4 extremities intact Psychiatric:   Mental status ? Mood, affect appropriate ? Orientation to person, place, time   judgment and insight appear intact   Discharge Instructions  Discharge Instructions    Call MD for:  difficulty breathing, headache or visual disturbances   Complete by: As directed    Call MD for:  extreme fatigue   Complete by: As directed    Call MD for:  persistant dizziness or light-headedness   Complete by: As directed    Call MD for:  redness, tenderness, or signs of infection (pain, swelling, redness, odor or green/yellow discharge around incision site)   Complete by: As directed    Diet - low sodium heart healthy   Complete by: As directed    Discharge instructions   Complete by: As directed    Discharge to home Left arm restrictions as per cardiology instructions Follow up with PCP in 7-10 days Follow up with cardiology as directed. Weigh yourself daily. Record weights. No driving until cleared by cardiology.   Increase activity slowly   Complete by: As directed      Allergies as of 10/26/2019      Reactions   Bactrim [sulfamethoxazole-trimethoprim] Nausea And Vomiting, Other (See Comments)   Bleeding and ulcers   Morphine And Related Nausea And Vomiting      Medication List    STOP taking these medications   glimepiride 2 MG tablet Commonly known as: AMARYL   olmesartan 20 MG tablet Commonly known as: BENICAR     TAKE these medications   Accu-Chek Aviva Plus test strip Generic drug: glucose blood USE AS INSTRUCTED TO CHECK BLOOD SUGAR TWICE A DAY DX:E11.65   acetaminophen 500 MG tablet Commonly known as: TYLENOL Take 500 mg by mouth every 4 (four) hours as needed for moderate pain or headache.   amiodarone 200 MG tablet Commonly known as: PACERONE Take 0.5 tablets (100 mg total) by mouth daily.   apixaban 2.5 MG Tabs tablet Commonly known as:  Eliquis Take 1 tablet (2.5 mg total) by mouth 2 (two) times daily. Start taking on: October 30, 2019 What changed: These instructions start on October 30, 2019. If you are unsure what to do until then, ask your doctor or other care provider.   B-D UF III MINI PEN NEEDLES 31G X 5 MM Misc Generic drug: Insulin Pen Needle USE 2 PEN NEEDLE PER DAY WITH HUMALOG AND VICTOZA   BD Insulin Syringe U/F 31G X 5/16" 1 ML Misc Generic drug: Insulin Syringe-Needle U-100 USE TO INJECT INSULIN DAILY   ferrous sulfate 325 (65 FE) MG tablet Take 325 mg by mouth 2 (two) times daily with a meal.   furosemide 40 MG tablet Commonly known as: LASIX Take 1 tablet (40 mg total) by mouth 2 (two) times daily. What changed:   how much to take  how to take this  when to take this  additional instructions   HumaLOG KwikPen 100 UNIT/ML KwikPen Generic drug: insulin lispro Inject 12 Units into the skin daily. Inject 12 units under the skin before the main meal of the day.   insulin NPH Human 100 UNIT/ML injection Commonly known as: HumuLIN N Inject 26 units of Humulin N under the skin once daily at bedtime. What changed:   how much to take  how to take this  when to take this  additional instructions   metolazone 5 MG tablet Commonly known as: ZAROXOLYN Taken on tablet by mouth daily on Monday and Thursday.  Take 30 minutes prior to Furosemide. What changed:   how much to take  how to take this  when to take this  additional instructions   multivitamin with minerals Tabs tablet Take 1 tablet by mouth daily. Mens One a day Vit   pantoprazole 40 MG tablet Commonly known as: PROTONIX Take 1 tablet (40 mg total) by mouth 2 (two) times daily.   pravastatin 80 MG tablet Commonly known as: PRAVACHOL Take 1 tablet (80 mg total) by mouth every evening.   PRESERVISION AREDS 2 PO Take 1 capsule by mouth 2 (two) times daily.   tamsulosin 0.4 MG Caps capsule Commonly known as:  FLOMAX Take 1 capsule daily What changed:   how much to take  how to take this  when to take this  additional instructions   Victoza 18 MG/3ML Sopn Generic drug: liraglutide INJECT 1.8 MG UNDER THE SKIN AT BEDTIME What changed: See the new instructions.   vitamin C 500 MG tablet Commonly known as: ASCORBIC ACID Take 500 mg by mouth daily.   Vitamin D 50 MCG (2000 UT) tablet Take 2,000 Units by mouth daily.      Allergies  Allergen Reactions  . Bactrim [Sulfamethoxazole-Trimethoprim] Nausea And Vomiting and Other (See Comments)    Bleeding and ulcers  . Morphine And Related Nausea And Vomiting    The results of significant diagnostics from this hospitalization (including imaging, microbiology, ancillary and laboratory) are listed below for reference.    Significant Diagnostic Studies: DG Chest 2 View  Result Date: 10/26/2019 CLINICAL DATA:  Cardiac pacer. EXAM: CHEST - 2 VIEW COMPARISON:  10/24/2019.  03/28/2018. FINDINGS: Cardiac pacer with lead tips over the right atrium right ventricle. Prior cardiac valve replacement. Stable cardiomegaly. No pulmonary venous congestion. No focal infiltrate. Stable mild right base pleural thickening consistent scarring. No significant pleural effusion or pneumothorax. Surgical dressing noted over the left upper chest. IMPRESSION: 1. Cardiac pacer noted with lead tips over the right atrium and right ventricle. Prior cardiac valve replacement. Stable cardiomegaly. No pulmonary venous congestion. 2.  No acute pulmonary disease. Electronically Signed   By: Marcello Moores  Register   On: 10/26/2019 07:12   EP PPM/ICD IMPLANT  Result Date: 10/25/2019 CONCLUSIONS:  1. Successful implantation of a medtronic dual-chamber pacemaker for symptomatic bradycardia due to sinus node dysfunction  2. No early apparent complications.       [name], MD 10/25/2019 1:17 PM   DG Chest Portable 1 View  Result Date: 10/24/2019 CLINICAL DATA:  Presyncope EXAM:  PORTABLE CHEST 1 VIEW COMPARISON:  03/28/2018 chest radiograph. FINDINGS: Aortic valve prosthesis in place. Stable cardiomediastinal silhouette with mild cardiomegaly. No pneumothorax. No pleural effusion. Lungs appear clear, with no acute consolidative airspace disease and no pulmonary edema. IMPRESSION: Mild cardiomegaly without pulmonary edema. No active pulmonary disease. Electronically Signed   By: Rinaldo Ratel  Poff M.D.   On: 10/24/2019 18:26    Microbiology: Recent Results (from the past 240 hour(s))  SARS CORONAVIRUS 2 (TAT 6-24 HRS) Nasopharyngeal Nasopharyngeal Swab     Status: None   Collection Time: 10/24/19  9:00 PM   Specimen: Nasopharyngeal Swab  Result Value Ref Range Status   SARS Coronavirus 2 NEGATIVE NEGATIVE Final    Comment: (NOTE) SARS-CoV-2 target nucleic acids are NOT DETECTED. The SARS-CoV-2 RNA is generally detectable in upper and lower respiratory specimens during the acute phase of infection. Negative results do not preclude SARS-CoV-2 infection, do not rule out co-infections with other pathogens, and should not be used as the sole basis for treatment or other patient management decisions. Negative results must be combined with clinical observations, patient history, and epidemiological information. The expected result is Negative. Fact Sheet for Patients: SugarRoll.be Fact Sheet for Healthcare Providers: https://www.woods-mathews.com/ This test is not yet approved or cleared by the [place] FDA and  has been authorized for detection and/or diagnosis of SARS-CoV-2 by FDA under an Emergency Use Authorization (EUA). This EUA will remain  in effect (meaning this test can be used) for the duration of the COVID-19 declaration under Section 56 4(b)(1) of the Act, 21 U.S.C. section 360bbb-3(b)(1), unless the authorization is terminated or revoked sooner. Performed at [facility], [place] [address], [place],  [place] [address]      Labs: Basic Metabolic Panel: Recent Labs  Lab 10/24/19 1720 10/24/19 1913 10/25/19 0503 10/26/19 0429  NA 135  --  138 140  K 4.9  --  4.5 4.2  CL 99  --  104 108  CO2 19*  --  22 22  GLUCOSE 223*  --  153* 145*  BUN 90*  --  81* 69*  CREATININE 2.57*  --  2.19* 2.09*  CALCIUM 8.8*  --  8.8* 8.9  MG  --  2.1  --   --    Liver Function Tests: Recent Labs  Lab 10/24/19 1720  AST 26  ALT 15  ALKPHOS 56  BILITOT 0.9  PROT 6.8  ALBUMIN 4.0   No results for input(s): LIPASE, AMYLASE in the last 168 hours. No results for input(s): AMMONIA in the last 168 hours. CBC: Recent Labs  Lab 10/24/19 1720 10/26/19 0429  WBC 10.3 7.8  NEUTROABS 7.6 5.4  HGB 11.6* 10.4*  HCT 35.7* 31.4*  MCV 96.7 95.4  PLT 132* 134*   Cardiac Enzymes: No results for input(s): CKTOTAL, CKMB, CKMBINDEX, TROPONINI in the last 168 hours. BNP: BNP (last 3 results) Recent Labs    10/24/19 1913  BNP 125.0*    ProBNP (last 3 results) Recent Labs    12/27/18 0933 01/03/19 1025 09/03/19 1032  PROBNP 3,828* 2,975* 3,705*    CBG: Recent Labs  Lab 10/25/19 0657 10/25/19 1319 10/25/19 1620 10/25/19 2122 10/26/19 0531  GLUCAP 123* 156* 269* 145* 129*    Principal Problem:   Symptomatic bradycardia Active Problems:   Type 2 diabetes mellitus (HCC)   Persistent atrial fibrillation (HCC)   Chronic combined systolic and diastolic CHF (congestive heart failure) (HCC)   Stage 3b chronic kidney disease   AKI (acute kidney injury) ([facility])  Time coordinating discharge: 38 minutes.  Signed:        Zackarie Chason, DO [facility]  10/26/2019, 2:06 PM

## 2019-10-26 NOTE — Progress Notes (Addendum)
Progress Note  Patient Name: Todd Romero Date of Encounter: 10/26/2019  Primary Cardiologist: [name], MD   Subjective   feels good, no CP, SOB. Denies site discomfort  Inpatient Medications    Scheduled Meds:  chlorhexidine  60 mL Topical Once   chlorhexidine  60 mL Topical Once   cholecalciferol  2,000 Units Oral Daily   ferrous sulfate  325 mg Oral BID WC   insulin aspart  0-9 Units Subcutaneous TID WC   insulin NPH Human  10 Units Subcutaneous QHS   pantoprazole  40 mg Oral BID   pravastatin  80 mg Oral QPM   sodium chloride flush  3 mL Intravenous Q12H   sodium chloride flush  3 mL Intravenous Q12H   tamsulosin  0.4 mg Oral QPM   Continuous Infusions:   ceFAZolin (ANCEF) IV 1 g (10/25/19 2200)   PRN Meds: acetaminophen, ondansetron (ZOFRAN) IV, ondansetron **OR** [DISCONTINUED] ondansetron (ZOFRAN) IV, sodium chloride flush   Vital Signs    Vitals:   10/25/19 1630 10/25/19 2056 10/26/19 0317 10/26/19 0928  BP: (!) 92/54 129/60 131/71 (!) 143/69  Pulse: 60 65 (!) 59 71  Resp:  16 16   Temp:  99 F (37.2 C) 98 F (36.7 C)   TempSrc:  Oral Oral   SpO2:  100% 97% 99%  Weight:   101 kg   Height:        Intake/Output Summary (Last 24 hours) at 10/26/2019 0952 Last data filed at 10/26/2019 0819 Gross per 24 hour  Intake 1070 ml  Output 1176 ml  Net -106 ml   Last 3 Weights 10/26/2019 10/25/2019 10/24/2019  Weight (lbs) 222 lb 11.2 oz 228 lb 220 lb  Weight (kg) 101.016 kg 103.42 kg 99.791 kg      Telemetry    SR intermittent/often AV pacing- Personally Reviewed  ECG    AV paced - Personally Reviewed  Physical Exam   GEN: No acute distress.   Neck: No JVD Cardiac: RRR, no murmurs, rubs, or gallops.  Respiratory: CTA b/l. GI: Soft, nontender, non-distended  MS: No edema; No deformity. Neuro:  Nonfocal  Psych: Normal affect   Pacemaker site is stable, no bleeding, no hematoma  Labs    High Sensitivity Troponin:   Recent  Labs  Lab 10/24/19 1913 10/24/19 2152  TROPONINIHS 38* 40*      Chemistry Recent Labs  Lab 10/24/19 1720 10/25/19 0503 10/26/19 0429  NA 135 138 140  K 4.9 4.5 4.2  CL 99 104 108  CO2 19* 22 22  GLUCOSE 223* 153* 145*  BUN 90* 81* 69*  CREATININE 2.57* 2.19* 2.09*  CALCIUM 8.8* 8.8* 8.9  PROT 6.8  --   --   ALBUMIN 4.0  --   --   AST 26  --   --   ALT 15  --   --   ALKPHOS 56  --   --   BILITOT 0.9  --   --   GFRNONAA 22* 27* 28*  GFRAA 26* 31* 33*  ANIONGAP 17* 12 10     Hematology Recent Labs  Lab 10/24/19 1720 10/26/19 0429  WBC 10.3 7.8  RBC 3.69* 3.29*  HGB 11.6* 10.4*  HCT 35.7* 31.4*  MCV 96.7 95.4  MCH 31.4 31.6  MCHC 32.5 33.1  RDW 13.4 13.4  PLT 132* 134*    BNP Recent Labs  Lab 10/24/19 1913  BNP 125.0*     DDimer No results  for input(s): DDIMER in the last 168 hours.   Radiology    DG Chest 2 View Result Date: 10/26/2019 CLINICAL DATA:  Cardiac pacer. EXAM: CHEST - 2 VIEW COMPARISON:  10/24/2019.  03/28/2018. FINDINGS: Cardiac pacer with lead tips over the right atrium right ventricle. Prior cardiac valve replacement. Stable cardiomegaly. No pulmonary venous congestion. No focal infiltrate. Stable mild right base pleural thickening consistent scarring. No significant pleural effusion or pneumothorax. Surgical dressing noted over the left upper chest. IMPRESSION: 1. Cardiac pacer noted with lead tips over the right atrium and right ventricle. Prior cardiac valve replacement. Stable cardiomegaly. No pulmonary venous congestion. 2.  No acute pulmonary disease. Electronically Signed   By: [place]   On: 10/26/2019 07:12     Cardiac Studies   09/13/2019 TTE IMPRESSIONS   1. Left ventricular ejection fraction, by visual estimation, is 35 to  40%. The left ventricle has moderately decreased function. There is  moderately increased left ventricular hypertrophy.   2. Elevated left atrial pressure.   3. Left ventricular diastolic  parameters are consistent with Grade II  diastolic dysfunction (pseudonormalization).   4. Global right ventricle has normal systolic function.The right  ventricular size is normal. No increase in right ventricular wall  thickness.   5. Left atrial size was moderately dilated.   6. Right atrial size was normal.   7. Moderate mitral annular calcification.   8. The mitral valve is abnormal. No evidence of mitral valve  regurgitation.   9. The tricuspid valve is normal in structure.  10. Bioprosthetic AVR. Aortic valve regurgitation is not visualized. Mean  gradient 9 mmHg, stable from prior TTE on 03/05/18  11. The pulmonic valve was not well visualized. Pulmonic valve  regurgitation is not visualized.  12. There is mild dilatation of the ascending aorta measuring 36 mm.  13. Mildly elevated pulmonary artery systolic pressure.  14. The tricuspid regurgitant velocity is 2.67 m/s, and with an assumed  right atrial pressure of 8 mmHg, the estimated right ventricular systolic  pressure is mildly elevated at 36.5 mmHg.  15. The inferior vena cava is dilated in size with >50% respiratory  variability, suggesting right atrial pressure of 8 mmHg.      03/05/2018: LVEF 43%     12/28/2016; LHC Difficult procedure due to severe angulation in the right innominate/ascending aortic region that produced catheter resistance to torque and advancement. Moderate aortic stenosis with a transvalvular gradient of 29 mmHg (mean) and 33-38 mmHg (peak to peak). Calculated aortic valve area 1.35 cm. Elevated left ventricular end-diastolic pressure and pulmonary capillary wedge pressure. Known LV systolic function with EF greater than 50% by recent echo suggesting chronic diastolic left heart failure. Heavy three-vessel coronary calcification. 100% stenosis in the mid body of the second diagonal. There is diffuse 30-50% narrowing in the LAD. The circumflex coronary is dominant. Moderate to heavy calcification is  noted throughout the distal vessel and in the proximal segment. There is eccentric 50-90% stenosis proximal to the origin of the L-PDA. Nondominant right coronary. Not selectively engaged due to poor catheter control. The vessel is patent and supplies only the right ventricle.   RECOMMENDATIONS:   Coronary status is essentially unchanged compared to 5 years ago. Moderate aortic stenosis based upon hemodynamics obtained from this case. This matches the recent echo. Explanation for exertional fatigue is uncertain. Consider referral to the valve clinic for a second opinion concerning management of aortic stenosis. May need dobutamine echo as he is potentially a low  gradient severe aortic stenosis substrate.  Patient Profile     86 y.o. male w/PMHx of  CAD (CTO of D1 and 90% LCX (cath 2013), VHD s/p TAVR 03/2017, severe OSA intolerant of CPAP, HTN, DM, HLD, AFib, CRI, CM admitted with near syncope Found mildly hypotensive by EMS and bradycardic to the 320's in the ER  Assessment & Plan    1. 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 Lovena Le this AM Small hematoma, stable Device check this morning with intact function No ptx by CXR this AM Wound care and activity instructions were reviewed with the patient Routine post implant follow up is in place    2. CAD     No anginal complaints     On statin, no ASA with Eliquis   3. CM (likely NICM)     No exam findings or symptoms to suggest volume OL currently     C/w Dr. Tamala Julian out patient     On ARB  Would consider starting Beta blocker now post pacing if BP tolerates, he sees Dr. Tamala Julian next week in the office, will defer to him at that time    4. Persistent Afib      CHA2DS2Vasc is 6, on Eliquis at home, appropriately at 2.5 for creat/age     Hold for PPM today     Maintaining SR resumed amiodarone  DO NOT RESUME ELIQUIS until Tuesday 10/30/2019    5. VHD      S/p TAVR 2018    6. AKI/CKD     likely a component likely 2/2 bradycardia     Creat rending down     C/w IM service   EP service will sign off though remain available, please recall if needed OK to discharge from our perspective when ready medically otherwise   For questions or updates, please contact [place] Please consult [email or web address] for contact info under   Signed, Baldwin [place], PA-C  10/26/2019, 9:52 AM    EP Attending  Patient seen and examined. Agree with the findings as noted above. The patient is doing well after undergoing PPM insertion yesterday. He medtronic DDD PM is working normally. He is stable for DC home. Restart blood thinners as noted next week. PPM interrogation under my direction demonstrates normal DDD PM function. We have turned on MVP to minimize ventricular pacing. Usual followup.   Mikle Bosworth.D.

## 2019-10-29 ENCOUNTER — Telehealth: Payer: Self-pay

## 2019-10-29 NOTE — Telephone Encounter (Signed)
Spoke with patients wife, Todd Romero.   Transition Care Management Follow-up Telephone Call  Admission: 10/24/2019-10/26/2019 Dx: Near syncope; Pacemaker placement   How have you been since you were released from the hospital? "I don't see much improvement". Wife reports pt is sleeping a lot (same prior to admission), h/o OSA with no CPAP.     Do you understand why you were in the hospital? yes   Do you understand the discharge instructions? yes   Where were you discharged to? Home. Resides with wife.    Items Reviewed:  Medications reviewed: no, not available at time of call. Aware to re-start Eliquis on 10/30/2019.   Allergies reviewed: yes  Dietary changes reviewed: yes  Referrals reviewed: yes, F/U with cardiologist Thursday 11/01/2019.    Functional Questionnaire:   Activities of Daily Living (ADLs):   He states they are independent in the following: ambulation, bathing and hygiene, feeding, continence, grooming, toileting and dressing States they require assistance with the following: None.    Any transportation issues/concerns?: no   Any patient concerns? yes, left hand slightly swollen. Advised to observe, notify if numbness/tingling.    Confirmed importance and date/time of follow-up visits scheduled yes  Provider Appointment booked with PCP 11/07/2019 @ 67, wife requested VV.   Confirmed with patient if condition begins to worsen call PCP or go to the ER.  Patient was given the office number and encouraged to call back with question or concerns.  : yes

## 2019-10-31 NOTE — Progress Notes (Signed)
Cardiology Office Note:    Date:  11/01/2019   ID:  Todd Romero, DOB 1933/09/29, MRN [id number]  PCP:  Ma Hillock, DO  Cardiologist:  Sinclair Grooms, MD   Referring MD: [name], MD   Chief Complaint  Patient presents with  . Atrial Fibrillation    Tachycardia-bradycardia syndrome  . Congestive Heart Failure    History of Present Illness:    Todd Romero is a 86 y.o. male with a hx of severe AS 03/2017 treated with TAVR,, chronic diastolic heart failure, CAD with CTO of D1 and 90% LCX (cath 2013),OSA not on therapy, HTN, type 2 diabetes, hyperlipidemia, PAF, electrical cardioversion on Amiodarone-> NSR and resolution of A/C/CHF, subsequent significant bradycardia and AV conduction abnormality on amiodarone leading to permanent pacemaker therapy after near syncopal episode in February 2021.Todd Romero  He and his wife are present.  They were concerned that I did not see them when he was in the hospital with near syncope.  They wanted my advice on whether to go forward with permanent pacemaker management.  They ultimately did and were well taken care of by Dr. Crissie Sickles.  Since discharge from the hospital he has done well.  Diuretic regimen was decreased.  He feels that slight weight gain is now occurring although he is not short of breath.  Last EKG noted during the hospital stay demonstrated AV sequential pacing.  He is now taking 40 mg of furosemide once per day and metolazone on Monday and Thursday.  He had been previously on 80 mg twice daily.  They are following weights to determine if intensification of diuretic regimen will be necessary.  Past Medical History:  Diagnosis Date  . Anemia   . Arthritis   . CAD (coronary artery disease)    a. cardiac cath 12/2016 showing moderate AS, elevated LVEDP, heavy 3V coronary calcification, 100% mD2, 30-50% LAD, 50-90% stenosis of Cx proximal to origin of L-PDA, RCA not engaged due to poor catheter control (difficult  procedure) - coronary status was essentially unchanged from prior.  . Chicken pox   . Chronic diastolic CHF (congestive heart failure) ([place])   . Colon polyps   . Degenerative arthritis   . Diabetes ([place])   . Dyspnea   . Essential hypertension   . Heart disease   . Heme positive stool 11/16/2017  . Hyperlipidemia   . New onset a-fib ([place]) 03/04/2018  . Obesity (BMI 30-39.9) 06/18/2015  . OSA (obstructive sleep apnea)    Severe with AHI 27/hr now on CPAP  not compliant with treatment.  . Peritonitis ([place]) 1985?  Todd Romero Pneumonia    "6 months - 86 years old"  . Pure hypercholesterolemia   . RBBB   . [facility] spotted fever   . 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 Severe aortic stenosis    a. s/p TAVR 03/2017.  . Type II or unspecified type diabetes mellitus without mention of complication, not stated as uncontrolled   . Varicose vein of leg     Past Surgical History:  Procedure Laterality Date  . APPENDECTOMY  1985   peritonitis  . CARDIOVERSION N/A 04/03/2018   Procedure: CARDIOVERSION;  Surgeon: Josue Hector, MD;  Location: [name] ENDOSCOPY;  Service: Cardiovascular;  Laterality: N/A;  . CARDIOVERSION N/A 01/18/2019   Procedure: CARDIOVERSION;  Surgeon: Skeet Latch, MD;  Location: [place];  Service: Cardiovascular;  Laterality: N/A;  . CARPAL TUNNEL RELEASE    . COLONOSCOPY WITH PROPOFOL N/A 11/18/2017   Procedure: COLONOSCOPY WITH PROPOFOL;  Surgeon: Carol Ada, MD;  Location: [facility] ENDOSCOPY;  Service: Endoscopy;  Laterality: N/A;  . ESOPHAGOGASTRODUODENOSCOPY N/A 11/18/2017   Procedure: ESOPHAGOGASTRODUODENOSCOPY (EGD);  Surgeon: Carol Ada, MD;  Location: [facility] ENDOSCOPY;  Service: Endoscopy;  Laterality: N/A;  . EYE SURGERY Bilateral    cataracts removed, lens placed  . JOINT REPLACEMENT    . KNEE CARTILAGE SURGERY    . PACEMAKER IMPLANT  N/A 10/25/2019   Procedure: PACEMAKER IMPLANT;  Surgeon: Evans Lance, MD;  Location: [facility] CV LAB;  Service: Cardiovascular;  Laterality: N/A;  . PARTIAL COLECTOMY    . RIGHT/LEFT HEART CATH AND CORONARY ANGIOGRAPHY N/A 12/28/2016   Procedure: Right/Left Heart Cath and Coronary Angiography;  Surgeon: Belva Crome, MD;  Location: [place] CV LAB;  Service: Cardiovascular;  Laterality: N/A;  . TEE WITHOUT CARDIOVERSION N/A 03/15/2017   Procedure: TRANSESOPHAGEAL ECHOCARDIOGRAM (TEE);  Surgeon: Burnell Blanks, MD;  Location: [place];  Service: Open Heart Surgery;  Laterality: N/A;  . TONSILLECTOMY AND ADENOIDECTOMY  1942  . TOTAL KNEE ARTHROPLASTY Right 11/08/2017  . TOTAL KNEE ARTHROPLASTY Right 11/08/2017   Procedure: TOTAL KNEE ARTHROPLASTY;  Surgeon: Melrose Nakayama, MD;  Location: [place];  Service: Orthopedics;  Laterality: Right;  . TRANSCATHETER AORTIC VALVE REPLACEMENT, TRANSFEMORAL N/A 03/15/2017   Procedure: TRANSCATHETER AORTIC VALVE REPLACEMENT, TRANSFEMORAL;  Surgeon: Burnell Blanks, MD;  Location: [place];  Service: Open Heart Surgery;  Laterality: N/A;  . VEIN SURGERY  1980    Current Medications: Current Meds  Medication Sig  . ACCU-CHEK AVIVA PLUS test strip USE AS INSTRUCTED TO CHECK BLOOD SUGAR TWICE A DAY DX:E11.65  . acetaminophen (TYLENOL) 500 MG tablet Take 500 mg by mouth every 4 (four) hours as needed for moderate pain or headache.  Todd Romero amiodarone (PACERONE) 200 MG tablet Take 0.5 tablets (100 mg total) by mouth daily.  Todd Romero apixaban (ELIQUIS) 2.5 MG TABS tablet Take 1 tablet (2.5 mg total) by mouth 2 (two) times daily.  . BD INSULIN SYRINGE U/F 31G X 5/16" 1 ML MISC USE TO INJECT INSULIN DAILY  . Cholecalciferol (VITAMIN D) 50 MCG (2000 UT) tablet Take 2,000 Units by mouth daily.  . ferrous sulfate 325 (65 FE) MG tablet Take 325 mg by mouth 2 (two) times daily with a meal.   . furosemide (LASIX) 40 MG tablet Take 1 tablet (40 mg total) by mouth 2 (two)  times daily.  . insulin lispro (HUMALOG KWIKPEN) 100 UNIT/ML KwikPen Inject 12 Units into the skin daily. Inject 12 units under the skin before the main meal of the day.  . insulin NPH Human (HUMULIN N) 100 UNIT/ML injection Inject 26 units of Humulin N under the skin once daily at bedtime.  . Insulin Pen Needle (B-D UF III MINI PEN NEEDLES) 31G X 5 MM MISC USE 2 PEN NEEDLE PER DAY WITH HUMALOG AND VICTOZA  . metolazone (ZAROXOLYN) 5 MG tablet Taken on tablet by mouth daily on Monday and Thursday.  Take 30 minutes prior to Furosemide.  . Multiple Vitamin (MULTIVITAMIN WITH MINERALS) TABS tablet Take 1 tablet by mouth daily. Mens One a day Vit  . Multiple Vitamins-Minerals (PRESERVISION AREDS 2 PO) Take 1 capsule by mouth 2 (two) times daily.  . pantoprazole (PROTONIX) 40 MG tablet Take 1 tablet (40 mg total) by mouth 2 (two) times daily.  . pravastatin (  PRAVACHOL) 80 MG tablet Take 1 tablet (80 mg total) by mouth every evening.  . tamsulosin (FLOMAX) 0.4 MG CAPS capsule Take 1 capsule daily  . VICTOZA 18 MG/3ML SOPN INJECT 1.8 MG UNDER THE SKIN AT BEDTIME  . vitamin C (ASCORBIC ACID) 500 MG tablet Take 500 mg by mouth daily.     Allergies:   Bactrim [sulfamethoxazole-trimethoprim] and Morphine and related   Social History   Socioeconomic History  . Marital status: Married    Spouse name: Not on file  . Number of children: 4  . Years of education: Not on file  . Highest education level: Not on file  Occupational History  . Occupation: Construction-Retired  Tobacco Use  . Smoking status: Never Smoker  . Smokeless tobacco: Never Used  Substance and Sexual Activity  . Alcohol use: No  . Drug use: No  . Sexual activity: Not Currently    Partners: Female  Other Topics Concern  . 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, retired Mining engineer:      -smoke alarm in the home:Yes     - wears seatbelt:  Yes     - Feels safe in their relationships: Yes   Social Determinants of Radio broadcast assistant Strain:   . Difficulty of Paying Living Expenses: Not on file  Food Insecurity:   . Worried About Charity fundraiser in the Last Year: Not on file  . Ran Out of Food in the Last Year: Not on file  Transportation Needs:   . Lack of Transportation (Medical): Not on file  . Lack of Transportation (Non-Medical): Not on file  Physical Activity:   . Days of Exercise per Week: Not on file  . Minutes of Exercise per Session: Not on file  Stress:   . Feeling of Stress : Not on file  Social Connections:   . Frequency of Communication with Friends and Family: Not on file  . Frequency of Social Gatherings with Friends and Family: Not on file  . Attends Religious Services: Not on file  . Active Member of Clubs or Organizations: Not on file  . Attends Archivist Meetings: Not on file  . Marital Status: Not on file     Family History: The patient's family history includes COPD in his father; Cancer in his sister; Early death in his father and mother; Emphysema in his father; Healthy in his sister; Heart attack in his father; Heart failure in his father; Hyperlipidemia in his mother; Hypertension in his mother and sister; Stroke in his mother.  ROS:   Please see the history of present illness.    He wants to know if he can stop some of his medications.  He has had no bruising, bleeding, or drainage from the left subclavicular pacemaker site.  All other systems reviewed and are negative.  EKGs/Labs/Other Studies Reviewed:    The following studies were reviewed today:  2D Doppler echocardiogram February 2021: IMPRESSIONS  1. Left ventricular ejection fraction, by visual estimation, is 35 to  40%. The left ventricle has moderately decreased function. There is  moderately increased left ventricular hypertrophy.  2. Elevated left atrial pressure.  3. Left ventricular diastolic  parameters are consistent with Grade II  diastolic dysfunction (pseudonormalization).  4. Global right ventricle has normal systolic function.The right  ventricular size is normal. No increase in right ventricular wall  thickness.  5. Left  atrial size was moderately dilated.  6. Right atrial size was normal.  7. Moderate mitral annular calcification.  8. The mitral valve is abnormal. No evidence of mitral valve  regurgitation.  9. The tricuspid valve is normal in structure.  10. Bioprosthetic AVR. Aortic valve regurgitation is not visualized. Mean  gradient 9 mmHg, stable from prior TTE on 03/05/18  11. The pulmonic valve was not well visualized. Pulmonic valve  regurgitation is not visualized.  12. There is mild dilatation of the ascending aorta measuring 36 mm.  13. Mildly elevated pulmonary artery systolic pressure.  14. The tricuspid regurgitant velocity is 2.67 m/s, and with an assumed  right atrial pressure of 8 mmHg, the estimated right ventricular systolic  pressure is mildly elevated at 36.5 mmHg.  15. The inferior vena cava is dilated in size with >50% respiratory  variability, suggesting right atrial pressure of 8 mmHg.      EKG:  EKG AV sequential pacing noted on the most recent EKG.  Recent Labs: 07/06/2019: TSH 0.671 09/03/2019: NT-Pro BNP 3,705 10/24/2019: ALT 15; B Natriuretic Peptide 125.0; Magnesium 2.1 10/26/2019: BUN 69; Creatinine, Ser 2.09; Hemoglobin 10.4; Platelets 134; Potassium 4.2; Sodium 140  Recent Lipid Panel    Component Value Date/Time   CHOL 112 02/13/2019 1005   TRIG 84.0 02/13/2019 1005   HDL 48.10 02/13/2019 1005   CHOLHDL 2 02/13/2019 1005   VLDL 16.8 02/13/2019 1005   LDLCALC 48 02/13/2019 1005   LDLDIRECT 101.0 04/26/2016 0915    Physical Exam:    VS:  BP 134/72   Pulse 82   Ht 5\' 7"  (1.702 m)   Wt 228 lb (103.4 kg)   SpO2 98%   BMI 35.71 kg/m     Wt Readings from Last 3 Encounters:  11/01/19 228 lb (103.4 kg)    10/26/19 222 lb 11.2 oz (101 kg)  09/25/19 233 lb (105.7 kg)     GEN: Elderly and moderately obese.. No acute distress HEENT: Normal NECK: No JVD. LYMPHATICS: No lymphadenopathy CARDIAC: Soft 1/6 systolic right upper sternal murmur.  RRR without diastolic murmur, gallop, or edema. VASCULAR:  Normal Pulses. No bruits. RESPIRATORY:  Clear to auscultation without rales, wheezing or rhonchi  ABDOMEN: Soft, non-tender, non-distended, No pulsatile mass, MUSCULOSKELETAL: No deformity  SKIN: Warm and dry NEUROLOGIC:  Alert and oriented x 3 PSYCHIATRIC:  Normal affect   ASSESSMENT:    1. Persistent atrial fibrillation ([place])   2. Chronic combined systolic and diastolic congestive heart failure ([place])   3. CKD (chronic kidney disease) stage 4, GFR 15-29 ml/min (HCC)   4. S/P TAVR (transcatheter aortic valve replacement)   5. On amiodarone therapy   6. Essential hypertension, benign   7. Obstructive sleep apnea   8. Chronic anticoagulation   9. Educated about COVID-19 virus infection    PLAN:    In order of problems listed above:  1. Currently in AV sequential pacing when recently checked by EKG.  DDD pacemaker placed recently related to progressive AV conduction system disease, bradycardia, and syncope.  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  I concur with the management strategy as this allows [place] to continue to use amiodarone to suppress A. Fib. 2. Controlled on current medical regimen with only unstable situation being the dose of loop diuretic.  He was discharged home on a significantly lower dose than previous.  He seems to be doing okay.  We will continue to monitor his weight and respiratory status. 3. Basic metabolic panel is done  today to reassess kidney function. 4. TAVR valve is functioning normally as noted above. 5. Blood pressure is well controlled. 6. Compliant with CPAP. 7. Stable on Eliquis. 8. He has received the COVID-19 vaccine.  Masking and social  distancing are encouraged.  Clinical follow-up in 2 to 4 weeks to reassess volume status.  Could be done by me or a team member.   Medication Adjustments/Labs and Tests Ordered: Current medicines are reviewed at length with the patient today.  Concerns regarding medicines are outlined above.  Orders Placed This Encounter  Procedures  . Basic metabolic panel  . Pro b natriuretic peptide   No orders of the defined types were placed in this encounter.   Patient Instructions  Medication Instructions:  Your physician recommends that you continue on your current medications as directed. Please refer to the Current Medication list given to you today.  *If you need a refill on your cardiac medications before your next appointment, please call your pharmacy*  Lab Work: BMET and Pro BNP  If you have labs (blood work) drawn today and your tests are completely normal, you will receive your results only by: Todd Romero MyChart Message (if you have MyChart) OR . A paper copy in the mail If you have any lab test that is abnormal or we need to change your treatment, we will call you to review the results.  Testing/Procedures: None  Follow-Up: At [facility], you and your health needs are our priority.  As part of our continuing mission to provide you with exceptional heart care, we have created designated Provider Care Teams.  These Care Teams include your primary Cardiologist (physician) and Advanced Practice Providers (APPs -  Physician Assistants and Nurse Practitioners) who all work together to provide you with the care you need, when you need it.  Your next appointment:   1 month(s)  The format for your next appointment:   In Person  Provider:   You may see Sinclair Grooms, MD or one of the following Advanced Practice Providers on your designated Care Team:    Truitt Merle, NP  Cecilie Kicks, NP  Kathyrn Drown, NP   Other Instructions      Signed, Sinclair Grooms, MD    11/01/2019 12:46 PM    [place]

## 2019-11-01 ENCOUNTER — Ambulatory Visit (INDEPENDENT_AMBULATORY_CARE_PROVIDER_SITE_OTHER): Payer: Medicare Other | Admitting: Interventional Cardiology

## 2019-11-01 ENCOUNTER — Other Ambulatory Visit: Payer: Self-pay

## 2019-11-01 ENCOUNTER — Encounter: Payer: Self-pay | Admitting: Interventional Cardiology

## 2019-11-01 VITALS — BP 134/72 | HR 82 | Ht 67.0 in | Wt 228.0 lb

## 2019-11-01 DIAGNOSIS — Z7901 Long term (current) use of anticoagulants: Secondary | ICD-10-CM

## 2019-11-01 DIAGNOSIS — Z952 Presence of prosthetic heart valve: Secondary | ICD-10-CM | POA: Diagnosis not present

## 2019-11-01 DIAGNOSIS — I5042 Chronic combined systolic (congestive) and diastolic (congestive) heart failure: Secondary | ICD-10-CM | POA: Diagnosis not present

## 2019-11-01 DIAGNOSIS — Z7189 Other specified counseling: Secondary | ICD-10-CM

## 2019-11-01 DIAGNOSIS — I10 Essential (primary) hypertension: Secondary | ICD-10-CM

## 2019-11-01 DIAGNOSIS — I4819 Other persistent atrial fibrillation: Secondary | ICD-10-CM

## 2019-11-01 DIAGNOSIS — N184 Chronic kidney disease, stage 4 (severe): Secondary | ICD-10-CM | POA: Diagnosis not present

## 2019-11-01 DIAGNOSIS — G4733 Obstructive sleep apnea (adult) (pediatric): Secondary | ICD-10-CM

## 2019-11-01 DIAGNOSIS — Z79899 Other long term (current) drug therapy: Secondary | ICD-10-CM

## 2019-11-01 NOTE — Patient Instructions (Signed)
Medication Instructions:  Your physician recommends that you continue on your current medications as directed. Please refer to the Current Medication list given to you today.  *If you need a refill on your cardiac medications before your next appointment, please call your pharmacy*  Lab Work: BMET and Pro BNP  If you have labs (blood work) drawn today and your tests are completely normal, you will receive your results only by: [name] MyChart Message (if you have MyChart) OR . A paper copy in the mail If you have any lab test that is abnormal or we need to change your treatment, we will call you to review the results.  Testing/Procedures: None  Follow-Up: At [facility], you and your health needs are our priority.  As part of our continuing mission to provide you with exceptional heart care, we have created designated Provider Care Teams.  These Care Teams include your primary Cardiologist (physician) and Advanced Practice Providers (APPs -  Physician Assistants and Nurse Practitioners) who all work together to provide you with the care you need, when you need it.  Your next appointment:   1 month(s)  The format for your next appointment:   In Person  Provider:   You may see Sinclair Grooms, MD or one of the following Advanced Practice Providers on your designated Care Team:    Truitt Merle, NP  Cecilie Kicks, NP  Kathyrn Drown, NP   Other Instructions

## 2019-11-02 LAB — PRO B NATRIURETIC PEPTIDE: NT-Pro BNP: 2476 pg/mL — ABNORMAL HIGH (ref 0–486)

## 2019-11-02 LAB — BASIC METABOLIC PANEL
BUN/Creatinine Ratio: 32 — ABNORMAL HIGH (ref 10–24)
BUN: 56 mg/dL — ABNORMAL HIGH (ref 8–27)
CO2: 22 mmol/L (ref 20–29)
Calcium: 9.5 mg/dL (ref 8.6–10.2)
Chloride: 99 mmol/L (ref 96–106)
Creatinine, Ser: 1.74 mg/dL — ABNORMAL HIGH (ref 0.76–1.27)
GFR calc Af Amer: 41 mL/min/{1.73_m2} — ABNORMAL LOW (ref >59)
GFR calc non Af Amer: 35 mL/min/{1.73_m2} — ABNORMAL LOW (ref >59)
Glucose: 165 mg/dL — ABNORMAL HIGH (ref 65–99)
Potassium: 4.6 mmol/L (ref 3.5–5.2)
Sodium: 138 mmol/L (ref 134–144)

## 2019-11-05 ENCOUNTER — Telehealth: Payer: Self-pay | Admitting: Endocrinology

## 2019-11-05 ENCOUNTER — Telehealth: Payer: Self-pay | Admitting: *Deleted

## 2019-11-05 DIAGNOSIS — I5042 Chronic combined systolic (congestive) and diastolic (congestive) heart failure: Secondary | ICD-10-CM

## 2019-11-05 MED ORDER — FUROSEMIDE 40 MG PO TABS: ORAL_TABLET | ORAL | 2 refills | Status: DC

## 2019-11-05 NOTE — Telephone Encounter (Signed)
There is no reason to stop glimepiride, he needs to go back on it and let me know if blood sugars stay high this week

## 2019-11-05 NOTE — Telephone Encounter (Signed)
Please advise 

## 2019-11-05 NOTE — Telephone Encounter (Signed)
Hospital took him off glimiperide on 10/25/19 due to pacemaker implant, he feels terrible now. BS readings  AM 190-200 PM 240-289 Please advise if he can restart or start something new?

## 2019-11-05 NOTE — Telephone Encounter (Signed)
Spoke with Dr. Tamala Julian because chart states pt is taking Furosemide 40mg  BID.  Dr. Tamala Julian said if this is accurate, increase Furosemide to 80AM/40PM.  Spoke with pt and went over results and recommendations.  Pt was taking 40mg  BID.  Advised to increase to 80AM/40PM.  Pt will come for labs 3/9.  Pt appreciative for call.

## 2019-11-05 NOTE — Telephone Encounter (Signed)
-----   Message from Belva Crome, MD sent at 11/04/2019  9:51 PM EST ----- Let the patient know the furosemide should be increased to 80 mg daily.BMET 7 days. A copy will be sent to Kuneff, Renee A, DO

## 2019-11-05 NOTE — Telephone Encounter (Signed)
Called pt and instructed him per Dr. Dwyane Dee that he should resume taking Glimepiride and call this office if he should have any questions.

## 2019-11-07 ENCOUNTER — Encounter: Payer: Self-pay | Admitting: Family Medicine

## 2019-11-07 ENCOUNTER — Other Ambulatory Visit: Payer: Self-pay

## 2019-11-07 ENCOUNTER — Ambulatory Visit (INDEPENDENT_AMBULATORY_CARE_PROVIDER_SITE_OTHER): Payer: Medicare Other | Admitting: Family Medicine

## 2019-11-07 VITALS — Ht 67.0 in | Wt 220.0 lb

## 2019-11-07 DIAGNOSIS — I4819 Other persistent atrial fibrillation: Secondary | ICD-10-CM

## 2019-11-07 DIAGNOSIS — Z952 Presence of prosthetic heart valve: Secondary | ICD-10-CM | POA: Diagnosis not present

## 2019-11-07 DIAGNOSIS — Z9289 Personal history of other medical treatment: Secondary | ICD-10-CM

## 2019-11-07 DIAGNOSIS — R001 Bradycardia, unspecified: Secondary | ICD-10-CM

## 2019-11-07 DIAGNOSIS — I5042 Chronic combined systolic (congestive) and diastolic (congestive) heart failure: Secondary | ICD-10-CM

## 2019-11-07 DIAGNOSIS — Z7901 Long term (current) use of anticoagulants: Secondary | ICD-10-CM

## 2019-11-07 DIAGNOSIS — I10 Essential (primary) hypertension: Secondary | ICD-10-CM

## 2019-11-07 DIAGNOSIS — N179 Acute kidney failure, unspecified: Secondary | ICD-10-CM

## 2019-11-07 DIAGNOSIS — I25119 Atherosclerotic heart disease of native coronary artery with unspecified angina pectoris: Secondary | ICD-10-CM

## 2019-11-07 DIAGNOSIS — I453 Trifascicular block: Secondary | ICD-10-CM | POA: Diagnosis not present

## 2019-11-07 DIAGNOSIS — Z950 Presence of cardiac pacemaker: Secondary | ICD-10-CM

## 2019-11-07 NOTE — Progress Notes (Signed)
VIRTUAL VISIT VIA VIDEO  I connected with Todd Romero on 11/08/19 at 11:30 AM EST by a video enabled telemedicine application and verified that I am speaking with the correct person using two identifiers. Location patient: Home Location provider: [facility], Office Persons participating in the virtual visit: Patient, Dr. Raoul Pitch and R.Baker, LPN  I discussed the limitations of evaluation and management by telemedicine and the availability of in person appointments. The patient expressed understanding and agreed to proceed.      Todd Romero , 31-Jul-1934, 86 y.o., male MRN: [id number] Patient Care Team    Relationship Specialty Notifications Start End  Ma Hillock, DO PCP - General Family Medicine  09/13/19   Belva Crome, MD PCP - Cardiology Cardiology  10/21/17   [name], MD Consulting Physician Endocrinology  09/25/19   Thompson Grayer, MD Consulting Physician Cardiology  10/01/19   Danice Goltz, MD Consulting Physician Ophthalmology  10/01/19   Monna Fam, MD Consulting Physician Ophthalmology  10/01/19   Carol Ada, MD Consulting Physician Gastroenterology  10/01/19     Chief Complaint  Patient presents with  . Hospitalization Follow-up    Pt is doing well after being in the hospital. Pt had pacemaker placed. Pt feels he has no energy.      Subjective:  Todd Romero  is a 86 y.o. male presents for hospital follow up after recent admission on 10/24/2019 for primary diagnosis bradycardia with dizziness. Patiet was discharged on 08/24/2020 to home. Patients discharge summary has been reviewed, as well as all labs/image studies obtained during hospitalization.   Patients hospital course: On the day of admission patient was working out in the yard and became very dizzy and felt like he was going to pass out.  He states he did not lose consciousness.  EMS was called and patient was found to be hypotensive with a blood pressure of  98/55.  He states he did not have any associated symptoms such as chest pain or shortness of breath, nausea vomiting diaphoresis etc.  In the ED he was found to be bradycardic with heart rates in the 30s for extended episodes.  He was given IV fluids and admitted.  Is a significant medical history of CAD/heart failure with reduced ejection fraction of 35-40%, AVS status post TAVR, diabetes type 2, A. fib in which he is treated with Eliquis and amiodarone.  He was found to have acute kidney injury with a BUN of 90 and a creatinine of 2.9.  Dr. Lovena Le was consulted and patient had a pacemaker placed.  He was discharged with close follow-up by cardiology.  Of note is Lasix was decreased to 40 mg twice daily at discharge and he reports glipizide was discontinued at discharge. Since hospital discharge patient reports he has been seen by cardiology and he has his follow-up with Dr. Lovena Le tomorrow concerning his pacemaker insertion follow-up.  He had sustained some fluid retention and his sugars have been elevated (FBG 160-170).  His cardiologist did increased his Lasix since hospital discharge now to 80/40 (prior 80/80).  He is also on metolazone Monday through Thursday.  He has been in touch with his endocrinologist who told him he could restart his glipizide and since his sugars have been better controlled and now back to his normal fasting around 109.  He states his cardiologist has rechecked his BMP at his follow-up appointment last week and his creatinine is returning back to his baseline. He  states today he is feeling much better.  He does endorse having low stamina and fatigue.  He denies any continued dizziness.  He feels his fluid status has improved since the increase in his Lasix.  No results for input(s): HGB, HCT, WBC, PLT in the last 168 hours. CMP Latest Ref Rng & Units 11/01/2019 10/26/2019 10/25/2019  Glucose 65 - 99 mg/dL 165(H) 145(H) 153(H)  BUN 8 - 27 mg/dL 56(H) 69(H) 81(H)  Creatinine 0.76 -  1.27 mg/dL 1.74(H) 2.09(H) 2.19(H)  Sodium 134 - 144 mmol/L 138 140 138  Potassium 3.5 - 5.2 mmol/L 4.6 4.2 4.5  Chloride 96 - 106 mmol/L 99 108 104  CO2 20 - 29 mmol/L 22 22 22   Calcium 8.6 - 10.2 mg/dL 9.5 8.9 8.8(L)  Total Protein 6.5 - 8.1 g/dL - - -  Total Bilirubin 0.3 - 1.2 mg/dL - - -  Alkaline Phos 38 - 126 U/L - - -  AST 15 - 41 U/L - - -  ALT 0 - 44 U/L - - -      DG Chest 2 View  Result Date: 10/26/2019 CLINICAL DATA:  Cardiac pacer. EXAM: CHEST - 2 VIEW COMPARISON:  10/24/2019.  03/28/2018. FINDINGS: Cardiac pacer with lead tips over the right atrium right ventricle. Prior cardiac valve replacement. Stable cardiomegaly. No pulmonary venous congestion. No focal infiltrate. Stable mild right base pleural thickening consistent scarring. No significant pleural effusion or pneumothorax. Surgical dressing noted over the left upper chest. IMPRESSION: 1. Cardiac pacer noted with lead tips over the right atrium and right ventricle. Prior cardiac valve replacement. Stable cardiomegaly. No pulmonary venous congestion. 2.  No acute pulmonary disease. Electronically Signed   By: Marcello Moores  Register   On: 10/26/2019 07:12   EP PPM/ICD IMPLANT  Result Date: 10/25/2019 CONCLUSIONS:  1. Successful implantation of a medtronic dual-chamber pacemaker for symptomatic bradycardia due to sinus node dysfunction  2. No early apparent complications.       Todd Peru, MD 10/25/2019 1:17 PM   DG Chest Portable 1 View  Result Date: 10/24/2019 CLINICAL DATA:  Presyncope EXAM: PORTABLE CHEST 1 VIEW COMPARISON:  03/28/2018 chest radiograph. FINDINGS: Aortic valve prosthesis in place. Stable cardiomediastinal silhouette with mild cardiomegaly. No pneumothorax. No pleural effusion. Lungs appear clear, with no acute consolidative airspace disease and no pulmonary edema. IMPRESSION: Mild cardiomegaly without pulmonary edema. No active pulmonary disease. Electronically Signed   By: Ilona Sorrel M.D.   On: 10/24/2019  18:26     Depression screen PHQ 2/9 05/06/2017  Decreased Interest 0  Down, Depressed, Hopeless 0  PHQ - 2 Score 0  Some recent data might be hidden    Allergies  Allergen Reactions  . Bactrim [Sulfamethoxazole-Trimethoprim] Nausea And Vomiting and Other (See Comments)    Bleeding and ulcers  . Morphine And Related Nausea And Vomiting   Social History   Tobacco Use  . Smoking status: Never Smoker  . Smokeless tobacco: Never Used  Substance Use Topics  . Alcohol use: No   Past Medical History:  Diagnosis Date  . Anemia   . Arthritis   . CAD (coronary artery disease)    a. cardiac cath 12/2016 showing moderate AS, elevated LVEDP, heavy 3V coronary calcification, 100% mD2, 30-50% LAD, 50-90% stenosis of Cx proximal to origin of L-PDA, RCA not engaged due to poor catheter control (difficult procedure) - coronary status was essentially unchanged from prior.  . Chicken pox   . Chronic diastolic CHF (congestive heart failure) ([place])   .  Colon polyps   . Degenerative arthritis   . Diabetes ([place])   . Dyspnea   . Essential hypertension   . Heart disease   . Heme positive stool 11/16/2017  . Hyperlipidemia   . New onset a-fib ([place]) 03/04/2018  . Obesity (BMI 30-39.9) 06/18/2015  . OSA (obstructive sleep apnea)    Severe with AHI 27/hr now on CPAP  not compliant with treatment.  . Peritonitis ([place]) 1985?  [name] Pneumonia    "6 months - 86 years old"  . Pure hypercholesterolemia   . RBBB   . [facility] spotted fever   . 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 Severe aortic stenosis    a. s/p TAVR 03/2017.  . Type II or unspecified type diabetes mellitus without mention of complication, not stated as uncontrolled   . Varicose vein of leg    Past Surgical History:  Procedure Laterality Date  . APPENDECTOMY  1985   peritonitis  . CARDIOVERSION N/A  04/03/2018   Procedure: CARDIOVERSION;  Surgeon: Josue Hector, MD;  Location: [facility] ENDOSCOPY;  Service: Cardiovascular;  Laterality: N/A;  . CARDIOVERSION N/A 01/18/2019   Procedure: CARDIOVERSION;  Surgeon: Skeet Latch, MD;  Location: [facility];  Service: Cardiovascular;  Laterality: N/A;  . CARPAL TUNNEL RELEASE    . COLONOSCOPY WITH PROPOFOL N/A 11/18/2017   Procedure: COLONOSCOPY WITH PROPOFOL;  Surgeon: Carol Ada, MD;  Location: [facility] ENDOSCOPY;  Service: Endoscopy;  Laterality: N/A;  . ESOPHAGOGASTRODUODENOSCOPY N/A 11/18/2017   Procedure: ESOPHAGOGASTRODUODENOSCOPY (EGD);  Surgeon: Carol Ada, MD;  Location: [facility] ENDOSCOPY;  Service: Endoscopy;  Laterality: N/A;  . EYE SURGERY Bilateral    cataracts removed, lens placed  . JOINT REPLACEMENT    . KNEE CARTILAGE SURGERY    . PACEMAKER IMPLANT N/A 10/25/2019   Procedure: PACEMAKER IMPLANT;  Surgeon: Evans Lance, MD;  Location: [facility] CV LAB;  Service: Cardiovascular;  Laterality: N/A;  . PARTIAL COLECTOMY    . RIGHT/LEFT HEART CATH AND CORONARY ANGIOGRAPHY N/A 12/28/2016   Procedure: Right/Left Heart Cath and Coronary Angiography;  Surgeon: Belva Crome, MD;  Location: [facility] LAB;  Service: Cardiovascular;  Laterality: N/A;  . TEE WITHOUT CARDIOVERSION N/A 03/15/2017   Procedure: TRANSESOPHAGEAL ECHOCARDIOGRAM (TEE);  Surgeon: Burnell Blanks, MD;  Location: [facility];  Service: Open Heart Surgery;  Laterality: N/A;  . TONSILLECTOMY AND ADENOIDECTOMY  1942  . TOTAL KNEE ARTHROPLASTY Right 11/08/2017  . TOTAL KNEE ARTHROPLASTY Right 11/08/2017   Procedure: TOTAL KNEE ARTHROPLASTY;  Surgeon: Melrose Nakayama, MD;  Location: [facility];  Service: Orthopedics;  Laterality: Right;  . TRANSCATHETER AORTIC VALVE REPLACEMENT, TRANSFEMORAL N/A 03/15/2017   Procedure: TRANSCATHETER AORTIC VALVE REPLACEMENT, TRANSFEMORAL;  Surgeon: Burnell Blanks, MD;  Location: [facility] [facility];  Service: Open Heart Surgery;  Laterality: N/A;  .  VEIN SURGERY  1980   Family History  Problem Relation Age of Onset  . Heart failure Father   . Emphysema Father   . COPD Father   . Early death Father   . Heart attack Father   . Hypertension Mother   . Early death Mother   . Hyperlipidemia Mother   . Stroke Mother   . Cancer Sister   . Hypertension Sister   . Healthy Sister    Allergies as of 11/07/2019       Reactions   Bactrim [sulfamethoxazole-trimethoprim] Nausea And Vomiting,  Other (See Comments)   Bleeding and ulcers   Morphine And Related Nausea And Vomiting        Medication List        Accurate as of November 07, 2019 11:54 AM. If you have any questions, ask your nurse or doctor.          Accu-Chek Aviva Plus test strip Generic drug: glucose blood USE AS INSTRUCTED TO CHECK BLOOD SUGAR TWICE A DAY DX:E11.65   acetaminophen 500 MG tablet Commonly known as: TYLENOL Take 500 mg by mouth every 4 (four) hours as needed for moderate pain or headache.   amiodarone 200 MG tablet Commonly known as: PACERONE Take 0.5 tablets (100 mg total) by mouth daily.   apixaban 2.5 MG Tabs tablet Commonly known as: Eliquis Take 1 tablet (2.5 mg total) by mouth 2 (two) times daily.   B-D UF III MINI PEN NEEDLES 31G X 5 MM Misc Generic drug: Insulin Pen Needle USE 2 PEN NEEDLE PER DAY WITH HUMALOG AND VICTOZA   BD Insulin Syringe U/F 31G X 5/16" 1 ML Misc Generic drug: Insulin Syringe-Needle U-100 USE TO INJECT INSULIN DAILY   ferrous sulfate 325 (65 FE) MG tablet Take 325 mg by mouth 2 (two) times daily with a meal.   furosemide 40 MG tablet Commonly known as: LASIX Take 2 tablets (80mg ) by mouth every morning.  Take one tablet by mouth every evening.   HumaLOG KwikPen 100 UNIT/ML KwikPen Generic drug: insulin lispro Inject 12 Units into the skin daily. Inject 12 units under the skin before the main meal of the day.   insulin NPH Human 100 UNIT/ML injection Commonly known as: HumuLIN N Inject 26 units of Humulin  N under the skin once daily at bedtime.   metolazone 5 MG tablet Commonly known as: ZAROXOLYN Taken on tablet by mouth daily on Monday and Thursday.  Take 30 minutes prior to Furosemide.   multivitamin with minerals Tabs tablet Take 1 tablet by mouth daily. Mens One a day Vit   pantoprazole 40 MG tablet Commonly known as: PROTONIX Take 1 tablet (40 mg total) by mouth 2 (two) times daily.   pravastatin 80 MG tablet Commonly known as: PRAVACHOL Take 1 tablet (80 mg total) by mouth every evening.   PRESERVISION AREDS 2 PO Take 1 capsule by mouth 2 (two) times daily.   tamsulosin 0.4 MG Caps capsule Commonly known as: FLOMAX Take 1 capsule daily   Victoza 18 MG/3ML Sopn Generic drug: liraglutide INJECT 1.8 MG UNDER THE SKIN AT BEDTIME   vitamin C 500 MG tablet Commonly known as: ASCORBIC ACID Take 500 mg by mouth daily.   Vitamin D 50 MCG (2000 UT) tablet Take 2,000 Units by mouth daily.        All past medical history, surgical history, allergies, family history, immunizations and medications were updated in the EMR today and reviewed under the history and medication portions of their EMR.      ROS: Negative, with the exception of above mentioned in HPI   Objective:  Ht 5\' 7"  (1.702 m)   Wt 220 lb (99.8 kg)   BMI 34.46 kg/m  Body mass index is 34.46 kg/m. Gen: Afebrile. No acute distress. Nontoxic in appearance, well developed, well nourished.  Very pleasant Caucasian male. HENT: AT. [place].  Eyes:Pupils Equal Round Reactive to light, Extraocular movements intact,  Conjunctiva without redness, discharge or icterus. Chest: No cough or shortness of breath Neuro:  Normal gait. PERLA. EOMi. Alert. Oriented  x3   Assessment/Plan: Todd Romero is a 86 y.o. male present for OV for Hospital discharge follow up New pacemaker/trifascicular block/Symptomatic bradycardia/S/P TAVR (transcatheter aortic valve replacement)/Persistent atrial fibrillation (HCC)/Essential  hypertension, benign/Chronic combined systolic and diastolic CHF (congestive heart failure) (HCC)/Coronary artery disease involving native coronary artery of native heart with angina pectoris ([place]) Patient feeling much improved.  No continued dizziness.  Has appropriate follow-up with Dr. Lovena Le tomorrow. Has followed with his cardiologist. Continue hypertensive medications at current doses.  Stable. Continue Lasix at 80/40 per cardiology. Medications unchanged. Laboratory follow-ups have been completed by cardiology and reviewed.  AKI (acute kidney injury) [facility] [place]) Viewed labs by cardiology and creatinine is returning to baseline.    Reviewed expectations re: course of current medical issues.  Discussed self-management of symptoms.  Outlined signs and symptoms indicating need for more acute intervention.  Patient verbalized understanding and all questions were answered.  Patient received an After-Visit Summary.  Any changes in medications were reviewed and patient was provided with updated med list with their AVS.          Note is dictated utilizing voice recognition software. Although note has been proof read prior to signing, occasional typographical errors still can be missed. If any questions arise, please do not hesitate to call for verification.   electronically signed by:  Howard Pouch, DO  [place]

## 2019-11-08 ENCOUNTER — Ambulatory Visit (INDEPENDENT_AMBULATORY_CARE_PROVIDER_SITE_OTHER): Payer: Medicare Other | Admitting: Student

## 2019-11-08 ENCOUNTER — Encounter: Payer: Self-pay | Admitting: Family Medicine

## 2019-11-08 DIAGNOSIS — Z950 Presence of cardiac pacemaker: Secondary | ICD-10-CM | POA: Insufficient documentation

## 2019-11-08 DIAGNOSIS — I4819 Other persistent atrial fibrillation: Secondary | ICD-10-CM

## 2019-11-08 DIAGNOSIS — Z7901 Long term (current) use of anticoagulants: Secondary | ICD-10-CM | POA: Insufficient documentation

## 2019-11-08 DIAGNOSIS — R001 Bradycardia, unspecified: Secondary | ICD-10-CM

## 2019-11-08 LAB — CUP PACEART INCLINIC DEVICE CHECK
Battery Remaining Longevity: 157 mo
Battery Voltage: 3.22 V
Brady Statistic AP VP Percent: 10.05 %
Brady Statistic AP VS Percent: 40.05 %
Brady Statistic AS VP Percent: 4.09 %
Brady Statistic AS VS Percent: 45.82 %
Brady Statistic RA Percent Paced: 49 %
Brady Statistic RV Percent Paced: 14.14 %
Date Time Interrogation Session: 20210304153255
Implantable Lead Implant Date: 20210218
Implantable Lead Implant Date: 20210218
Implantable Lead Location: 753859
Implantable Lead Location: 753860
Implantable Lead Model: 3830
Implantable Lead Model: 5076
Implantable Pulse Generator Implant Date: 20210218
Lead Channel Impedance Value: 361 Ohm
Lead Channel Impedance Value: 380 Ohm
Lead Channel Impedance Value: 513 Ohm
Lead Channel Impedance Value: 551 Ohm
Lead Channel Pacing Threshold Amplitude: 0.5 V
Lead Channel Pacing Threshold Amplitude: 0.75 V
Lead Channel Pacing Threshold Pulse Width: 0.4 ms
Lead Channel Pacing Threshold Pulse Width: 0.4 ms
Lead Channel Sensing Intrinsic Amplitude: 2.625 mV
Lead Channel Sensing Intrinsic Amplitude: 2.75 mV
Lead Channel Sensing Intrinsic Amplitude: 9.125 mV
Lead Channel Sensing Intrinsic Amplitude: 9.125 mV
Lead Channel Setting Pacing Amplitude: 3.5 V
Lead Channel Setting Pacing Amplitude: 3.5 V
Lead Channel Setting Pacing Pulse Width: 0.4 ms
Lead Channel Setting Sensing Sensitivity: 1.2 mV

## 2019-11-08 NOTE — Progress Notes (Signed)
Wound check appointment. Steri-strips removed. Wound without redness or edema. Incision edges approximated, wound well healed. Normal device function. Thresholds, sensing, and impedances consistent with implant measurements. Device programmed at 3.5V/auto capture programmed on for extra safety margin until 3 month visit. Histogram distribution appropriate for patient and level of activity. No mode switches or high ventricular rates noted. Patient educated about wound care, arm mobility, lifting restrictions. ROV in 3 months with Dr. Lovena Le

## 2019-11-12 ENCOUNTER — Other Ambulatory Visit: Payer: Self-pay | Admitting: Interventional Cardiology

## 2019-11-12 MED ORDER — PANTOPRAZOLE SODIUM 40 MG PO TBEC: 40.0000 mg | DELAYED_RELEASE_TABLET | Freq: Two times a day (BID) | ORAL | 0 refills | Status: DC

## 2019-11-12 NOTE — Telephone Encounter (Signed)
New Message   *STAT* If patient is at the pharmacy, call can be transferred to refill team.   1. Which medications need to be refilled? (please list name of each medication and dose if known) pantoprazole (PROTONIX) 40 MG tablet  2. Which pharmacy/location (including street and city if local pharmacy) is medication to be sent to? [id number] - [place], [place] - [address]  3. Do they need a 30 day or 90 day supply? 10 day supply  [facility] him before he runs out and will need a short supply.

## 2019-11-13 ENCOUNTER — Other Ambulatory Visit: Payer: Medicare Other

## 2019-11-13 ENCOUNTER — Other Ambulatory Visit: Payer: Self-pay

## 2019-11-13 DIAGNOSIS — I5042 Chronic combined systolic (congestive) and diastolic (congestive) heart failure: Secondary | ICD-10-CM

## 2019-11-13 LAB — BASIC METABOLIC PANEL
BUN/Creatinine Ratio: 35 — ABNORMAL HIGH (ref 10–24)
BUN: 76 mg/dL (ref 8–27)
CO2: 23 mmol/L (ref 20–29)
Calcium: 9.3 mg/dL (ref 8.6–10.2)
Chloride: 93 mmol/L — ABNORMAL LOW (ref 96–106)
Creatinine, Ser: 2.19 mg/dL — ABNORMAL HIGH (ref 0.76–1.27)
GFR calc Af Amer: 31 mL/min/{1.73_m2} — ABNORMAL LOW (ref >59)
GFR calc non Af Amer: 27 mL/min/{1.73_m2} — ABNORMAL LOW (ref >59)
Glucose: 189 mg/dL — ABNORMAL HIGH (ref 65–99)
Potassium: 3.8 mmol/L (ref 3.5–5.2)
Sodium: 136 mmol/L (ref 134–144)

## 2019-11-16 ENCOUNTER — Telehealth: Payer: Self-pay | Admitting: Endocrinology

## 2019-11-16 ENCOUNTER — Other Ambulatory Visit: Payer: Self-pay | Admitting: *Deleted

## 2019-11-16 ENCOUNTER — Other Ambulatory Visit: Payer: Self-pay | Admitting: Interventional Cardiology

## 2019-11-16 MED ORDER — FUROSEMIDE 40 MG PO TABS: 40.0000 mg | ORAL_TABLET | Freq: Two times a day (BID) | ORAL | 2 refills | Status: DC

## 2019-11-16 NOTE — Telephone Encounter (Signed)
Called pt and he stated that since going back on Glimepiride once daily in the morning, his blood sugars are good in the am, staying around 120, but afternoon sugars are consistently in the mid 250's. Pt would like to know if he needs to go back to 1 tablet in the morning and one in the evening for better control?

## 2019-11-16 NOTE — Telephone Encounter (Signed)
He can take 1 mg glimepiride in the morning also but confirm that he is taking NovoLog at least 4 units for his oatmeal in the morning.  If his blood sugars about 2 hours after breakfast are over 200 he will need to increase the NovoLog by 2 to 3 units also

## 2019-11-16 NOTE — Telephone Encounter (Signed)
Pt is already taking one tablet in the am. He wants to know if he can take one in the evening as well.  Pt states that he has not been eating oatmeal in the mornings. As a result, he has not been taking any novolog before breakfast, but if he does eat oatmeal, he does take 4 units.  On days when he does not eat oatmeal, he only takes 12-14 units of insulin before his main meal.

## 2019-11-16 NOTE — Telephone Encounter (Signed)
Patient requests to be called at ph#  [phone] re: Patient has questions about glimepiride dosage.

## 2019-11-16 NOTE — Telephone Encounter (Signed)
He will not take glimepiride in the evening.  Needs to take 8 to 10 units of NovoLog at lunchtime so that afternoon sugars are at least below 180

## 2019-11-16 NOTE — Telephone Encounter (Signed)
*  STAT* If patient is at the pharmacy, call can be transferred to refill team.   1. Which medications need to be refilled? (please list name of each medication and dose if known) pantoprazole (PROTONIX) 40 MG tablet  2. Which pharmacy/location (including street and city if local pharmacy) is medication to be sent to?  [facility] [place], [place]  3. Do they need a 30 day or 90 day supply? 90 day

## 2019-11-19 MED ORDER — PANTOPRAZOLE SODIUM 40 MG PO TBEC: 40.0000 mg | DELAYED_RELEASE_TABLET | Freq: Two times a day (BID) | ORAL | 3 refills | Status: DC

## 2019-11-19 NOTE — Telephone Encounter (Signed)
Pt's medication was sent to pt's pharmacy as requested. Confirmation received.  °

## 2019-11-19 NOTE — Telephone Encounter (Signed)
Called pt and gave him MD message. Pt verbalized understanding. 

## 2019-11-28 ENCOUNTER — Telehealth: Payer: Self-pay

## 2019-11-28 NOTE — Telephone Encounter (Signed)
This will be the third time pt has had issues with blood sugars since resuming Glimepiride. Would you like to see pt in person?

## 2019-11-28 NOTE — Telephone Encounter (Signed)
yes

## 2019-11-28 NOTE — Telephone Encounter (Signed)
Please schedule visit with pt for soonest available.

## 2019-11-28 NOTE — Telephone Encounter (Signed)
Patient was put back on glimepiride and Humalog by Dr. Dwyane Dee and he is on a diabetic friendly diet and his evening numbers are high around 250-patient would like a call back to discuss this with Dr. Jodelle Green nurse

## 2019-11-29 ENCOUNTER — Ambulatory Visit: Payer: Medicare Other | Admitting: Interventional Cardiology

## 2019-11-29 NOTE — Progress Notes (Signed)
Cardiology Office Note:    Date:  11/30/2019   ID:  Todd Romero, DOB Feb 09, 1934, MRN [id number]  PCP:  Ma Hillock, DO  Cardiologist:  Sinclair Grooms, MD   Referring MD: Ma Hillock, DO   Chief Complaint  Patient presents with  . Coronary Artery Disease  . Cardiac Valve Problem    History of Present Illness:    Todd Romero is a 86 y.o. male with a hx of severe AS 03/2017 treated with TAVR,, chronic diastolic heart failure, CAD with CTO of D1 and 90% LCX (cath 2013),OSA not on therapy, HTN, type 2 diabetes, hyperlipidemia, PAF, electrical cardioversion on Amiodarone-> NSR and resolution of A/C/CHF, subsequent significant bradycardia and AV conduction abnormality on amiodarone leading to permanent pacemaker therapy after near syncopal episode in February 2021..  We increased furosemide to 80 mg a.m. and 40 mg p.m. and quickly noticed deterioration in kidney function with creatinine of 2.2 and BUN increasing to 76.  He has now on 40 mg a.m. 40 mg p.o. no furosemide and metolazone on Monday and Thursdays.  He feels back to his normal baseline.  Despite the weight being up on today's visit on his scales at home he states that his weight has been stable at around 220.  He denies orthopnea.  He feels better.  Wife is concerned that he sleeps all the time.  He has no energy.  He refuses to treat sleep apnea with CPAP.  Past Medical History:  Diagnosis Date  . Anemia   . Arthritis   . CAD (coronary artery disease)    a. cardiac cath 12/2016 showing moderate AS, elevated LVEDP, heavy 3V coronary calcification, 100% mD2, 30-50% LAD, 50-90% stenosis of Cx proximal to origin of L-PDA, RCA not engaged due to poor catheter control (difficult procedure) - coronary status was essentially unchanged from prior.  . Chicken pox   . Chronic diastolic CHF (congestive heart failure) ([place])   . Colon polyps   . Degenerative arthritis   . Diabetes ([place])   . Dyspnea   . Essential  hypertension   . Heart disease   . Heme positive stool 11/16/2017  . Hyperlipidemia   . New onset a-fib ([place]) 03/04/2018  . Obesity (BMI 30-39.9) 06/18/2015  . OSA (obstructive sleep apnea)    Severe with AHI 27/hr now on CPAP  not compliant with treatment.  . Peritonitis ([place]) 1985?  [name] Pneumonia    "6 months - 86 years old"  . Pure hypercholesterolemia   . RBBB   . [facility] spotted fever   . 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 Severe aortic stenosis    a. s/p TAVR 03/2017.  . Type II or unspecified type diabetes mellitus without mention of complication, not stated as uncontrolled   . Varicose vein of leg     Past Surgical History:  Procedure Laterality Date  . APPENDECTOMY  1985   peritonitis  . CARDIOVERSION N/A 04/03/2018   Procedure: CARDIOVERSION;  Surgeon: Josue Hector, MD;  Location: [facility] ENDOSCOPY;  Service: Cardiovascular;  Laterality: N/A;  . CARDIOVERSION N/A 01/18/2019   Procedure: CARDIOVERSION;  Surgeon: Skeet Latch, MD;  Location: [place];  Service: Cardiovascular;  Laterality: N/A;  . CARPAL TUNNEL RELEASE    . COLONOSCOPY WITH PROPOFOL N/A 11/18/2017   Procedure: COLONOSCOPY WITH PROPOFOL;  Surgeon: Carol Ada, MD;  Location: [facility] ENDOSCOPY;  Service: Endoscopy;  Laterality: N/A;  . ESOPHAGOGASTRODUODENOSCOPY N/A 11/18/2017   Procedure: ESOPHAGOGASTRODUODENOSCOPY (EGD);  Surgeon: Carol Ada, MD;  Location: [facility] ENDOSCOPY;  Service: Endoscopy;  Laterality: N/A;  . EYE SURGERY Bilateral    cataracts removed, lens placed  . JOINT REPLACEMENT    . KNEE CARTILAGE SURGERY    . PACEMAKER IMPLANT N/A 10/25/2019   Procedure: PACEMAKER IMPLANT;  Surgeon: Evans Lance, MD;  Location: [place] CV LAB;  Service: Cardiovascular;  Laterality: N/A;  . PARTIAL COLECTOMY    . RIGHT/LEFT HEART CATH AND CORONARY ANGIOGRAPHY N/A  12/28/2016   Procedure: Right/Left Heart Cath and Coronary Angiography;  Surgeon: Belva Crome, MD;  Location: [place] CV LAB;  Service: Cardiovascular;  Laterality: N/A;  . TEE WITHOUT CARDIOVERSION N/A 03/15/2017   Procedure: TRANSESOPHAGEAL ECHOCARDIOGRAM (TEE);  Surgeon: Burnell Blanks, MD;  Location: [place];  Service: Open Heart Surgery;  Laterality: N/A;  . TONSILLECTOMY AND ADENOIDECTOMY  1942  . TOTAL KNEE ARTHROPLASTY Right 11/08/2017  . TOTAL KNEE ARTHROPLASTY Right 11/08/2017   Procedure: TOTAL KNEE ARTHROPLASTY;  Surgeon: Melrose Nakayama, MD;  Location: [place];  Service: Orthopedics;  Laterality: Right;  . TRANSCATHETER AORTIC VALVE REPLACEMENT, TRANSFEMORAL N/A 03/15/2017   Procedure: TRANSCATHETER AORTIC VALVE REPLACEMENT, TRANSFEMORAL;  Surgeon: Burnell Blanks, MD;  Location: [place];  Service: Open Heart Surgery;  Laterality: N/A;  . VEIN SURGERY  1980    Current Medications: Current Meds  Medication Sig  . ACCU-CHEK AVIVA PLUS test strip USE AS INSTRUCTED TO CHECK BLOOD SUGAR TWICE A DAY DX:E11.65  . acetaminophen (TYLENOL) 500 MG tablet Take 500 mg by mouth every 4 (four) hours as needed for moderate pain or headache.  [name] amiodarone (PACERONE) 200 MG tablet Take 0.5 tablets (100 mg total) by mouth daily.  [name] apixaban (ELIQUIS) 2.5 MG TABS tablet Take 1 tablet (2.5 mg total) by mouth 2 (two) times daily.  . BD INSULIN SYRINGE U/F 31G X 5/16" 1 ML MISC USE TO INJECT INSULIN DAILY  . Cholecalciferol (VITAMIN D) 50 MCG (2000 UT) tablet Take 2,000 Units by mouth daily.  . ferrous sulfate 325 (65 FE) MG tablet Take 325 mg by mouth 2 (two) times daily with a meal.   . furosemide (LASIX) 40 MG tablet Take 1 tablet (40 mg total) by mouth 2 (two) times daily.  . insulin lispro (HUMALOG KWIKPEN) 100 UNIT/ML KwikPen Inject 12 Units into the skin daily. Inject 12 units under the skin before the main meal of the day.  . insulin NPH Human (HUMULIN N) 100 UNIT/ML injection  Inject 26 units of Humulin N under the skin once daily at bedtime.  . Insulin Pen Needle (B-D UF III MINI PEN NEEDLES) 31G X 5 MM MISC USE 2 PEN NEEDLE PER DAY WITH HUMALOG AND VICTOZA  . metolazone (ZAROXOLYN) 5 MG tablet Taken on tablet by mouth daily on Monday and Thursday.  Take 30 minutes prior to Furosemide.  . Multiple Vitamin (MULTIVITAMIN WITH MINERALS) TABS tablet Take 1 tablet by mouth daily. Mens One a day Vit  . Multiple Vitamins-Minerals (PRESERVISION AREDS 2 PO) Take 1 capsule by mouth 2 (two) times daily.  . pantoprazole (PROTONIX) 40 MG tablet Take 1 tablet (40 mg total) by mouth 2 (two) times daily.  . pravastatin (PRAVACHOL) 80 MG tablet Take 1 tablet (80 mg total) by mouth every evening.  . tamsulosin (FLOMAX) 0.4 MG CAPS capsule Take 1 capsule daily  . VICTOZA 18 MG/3ML  SOPN INJECT 1.8 MG UNDER THE SKIN AT BEDTIME  . vitamin C (ASCORBIC ACID) 500 MG tablet Take 500 mg by mouth daily.     Allergies:   Bactrim [sulfamethoxazole-trimethoprim] and Morphine and related   Social History   Socioeconomic History  . Marital status: Married    Spouse name: Not on file  . Number of children: 4  . Years of education: Not on file  . Highest education level: Not on file  Occupational History  . Occupation: Construction-Retired  Tobacco Use  . Smoking status: Never Smoker  . Smokeless tobacco: Never Used  Substance and Sexual Activity  . Alcohol use: No  . Drug use: No  . Sexual activity: Not Currently    Partners: Female  Other Topics Concern  . 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 Difficulty of Paying Living Expenses:   Food Insecurity:   . Worried About Charity fundraiser in the Last  Year:   . Arboriculturist in the Last Year:   Transportation Needs:   . Film/video editor (Medical):   [name] Lack of Transportation (Non-Medical):   Physical Activity:   . Days of Exercise per Week:   . Minutes of Exercise per Session:   Stress:   . Feeling of Stress :   Social Connections:   . Frequency of Communication with Friends and Family:   . Frequency of Social Gatherings with Friends and Family:   . Attends Religious Services:   . Active Member of Clubs or Organizations:   . Attends Archivist Meetings:   [name] Marital Status:      Family History: The patient's family history includes COPD in his father; Cancer in his sister; Early death in his father and mother; Emphysema in his father; Healthy in his sister; Heart attack in his father; Heart failure in his father; Hyperlipidemia in his mother; Hypertension in his mother and sister; Stroke in his mother.  ROS:   Please see the history of present illness.    Not using CPAP all other systems reviewed and are negative.  EKGs/Labs/Other Studies Reviewed:    The following studies were reviewed today: No new functional data  EKG:  EKG not repeated  Recent Labs: 07/06/2019: TSH 0.671 10/24/2019: ALT 15; B Natriuretic Peptide 125.0; Magnesium 2.1 10/26/2019: Hemoglobin 10.4; Platelets 134 11/01/2019: NT-Pro BNP 2,476 11/13/2019: BUN 76; Creatinine, Ser 2.19; Potassium 3.8; Sodium 136  Recent Lipid Panel    Component Value Date/Time   CHOL 112 02/13/2019 1005   TRIG 84.0 02/13/2019 1005   HDL 48.10 02/13/2019 1005   CHOLHDL 2 02/13/2019 1005   VLDL 16.8 02/13/2019 1005   LDLCALC 48 02/13/2019 1005   LDLDIRECT 101.0 04/26/2016 0915    Physical Exam:    VS:  BP 132/68   Pulse 95   Ht 5\' 7"  (1.702 m)   Wt 228 lb 3.2 oz (103.5 kg)   SpO2 97%   BMI 35.74 kg/m     Wt Readings from Last 3 Encounters:  11/30/19 228 lb 3.2 oz (103.5 kg)  11/07/19 220 lb (99.8 kg)  11/01/19 228 lb (103.4 kg)  GEN: Mild  obesity, bearded.. No acute distress HEENT: Normal NECK: No JVD. LYMPHATICS: No lymphadenopathy CARDIAC: 2/6 right upper sternal systolic murmur.  RRR without murmur, gallop, but with trace bilateral ankle edema. VASCULAR:  Normal Pulses. No bruits. RESPIRATORY:  Clear to auscultation without rales, wheezing or rhonchi  ABDOMEN: Soft, non-tender, non-distended, No pulsatile mass, MUSCULOSKELETAL: No deformity  SKIN: Warm and dry NEUROLOGIC:  Alert and oriented x 3 PSYCHIATRIC:  Normal affect   ASSESSMENT:    1. S/P TAVR (transcatheter aortic valve replacement)   2. On amiodarone therapy   3. Chronic combined systolic and diastolic congestive heart failure ([place])   4. CKD (chronic kidney disease) stage 4, GFR 15-29 ml/min (HCC)   5. Pacemaker   6. Persistent atrial fibrillation ([place])   7. Obstructive sleep apnea   8. Educated about COVID-19 virus infection    PLAN:    In order of problems listed above:  1. TAVR valve is functioning normally. 2. TSH will be obtained today.  Liver panel was normal earlier this year. 3. Continue current heart failure therapy which includes twice weekly metolazone, furosemide 40 mg twice daily, but no ARB, Arni, or mineralocorticoid receptor antagonist due to chronic kidney disease.  Consider BiDil for heart failure given kidney impairment. 4. Comprehensive metabolic panel will be obtained today. 5. Normal functioning of pacemaker. 6. Amiodarone therapy is maintaining sinus rhythm with AV sequential pacing presumed.  Continue amiodarone.  Check TSH today. 7. Encouraged him to try CPAP again.  He is adamant against.  Sleep apnea is the reason that he has some much fatigue and excessive daytime sleepiness. 8. COVID-19 vaccine is been received.   Medication Adjustments/Labs and Tests Ordered: Current medicines are reviewed at length with the patient today.  Concerns regarding medicines are outlined above.  No orders of the defined types were placed in  this encounter.  No orders of the defined types were placed in this encounter.   There are no Patient Instructions on file for this visit.   Signed, Sinclair Grooms, MD  11/30/2019 1:23 PM    [place]

## 2019-11-30 ENCOUNTER — Ambulatory Visit (INDEPENDENT_AMBULATORY_CARE_PROVIDER_SITE_OTHER): Payer: Medicare Other | Admitting: Interventional Cardiology

## 2019-11-30 ENCOUNTER — Encounter: Payer: Self-pay | Admitting: Interventional Cardiology

## 2019-11-30 ENCOUNTER — Other Ambulatory Visit: Payer: Self-pay

## 2019-11-30 VITALS — BP 132/68 | HR 95 | Ht 67.0 in | Wt 228.2 lb

## 2019-11-30 DIAGNOSIS — Z79899 Other long term (current) drug therapy: Secondary | ICD-10-CM | POA: Diagnosis not present

## 2019-11-30 DIAGNOSIS — Z952 Presence of prosthetic heart valve: Secondary | ICD-10-CM

## 2019-11-30 DIAGNOSIS — N184 Chronic kidney disease, stage 4 (severe): Secondary | ICD-10-CM | POA: Diagnosis not present

## 2019-11-30 DIAGNOSIS — I5042 Chronic combined systolic (congestive) and diastolic (congestive) heart failure: Secondary | ICD-10-CM | POA: Diagnosis not present

## 2019-11-30 DIAGNOSIS — Z7189 Other specified counseling: Secondary | ICD-10-CM

## 2019-11-30 DIAGNOSIS — Z950 Presence of cardiac pacemaker: Secondary | ICD-10-CM

## 2019-11-30 DIAGNOSIS — G4733 Obstructive sleep apnea (adult) (pediatric): Secondary | ICD-10-CM

## 2019-11-30 DIAGNOSIS — I4819 Other persistent atrial fibrillation: Secondary | ICD-10-CM

## 2019-11-30 NOTE — Patient Instructions (Signed)
Medication Instructions:  Your physician recommends that you continue on your current medications as directed. Please refer to the Current Medication list given to you today.  *If you need a refill on your cardiac medications before your next appointment, please call your pharmacy*   Lab Work: BMET, CBC, Pro BNP and TSH today  If you have labs (blood work) drawn today and your tests are completely normal, you will receive your results only by: [name] MyChart Message (if you have MyChart) OR . A paper copy in the mail If you have any lab test that is abnormal or we need to change your treatment, we will call you to review the results.   Testing/Procedures: None   Follow-Up: At [facility], you and your health needs are our priority.  As part of our continuing mission to provide you with exceptional heart care, we have created designated Provider Care Teams.  These Care Teams include your primary Cardiologist (physician) and Advanced Practice Providers (APPs -  Physician Assistants and Nurse Practitioners) who all work together to provide you with the care you need, when you need it.  We recommend signing up for the patient portal called "MyChart".  Sign up information is provided on this After Visit Summary.  MyChart is used to connect with patients for Virtual Visits (Telemedicine).  Patients are able to view lab/test results, encounter notes, upcoming appointments, etc.  Non-urgent messages can be sent to your provider as well.   To learn more about what you can do with MyChart, go to [email or web address].    Your next appointment:   2 month(s)  The format for your next appointment:   In Person  Provider:   You may see Sinclair Grooms, MD or one of the following Advanced Practice Providers on your designated Care Team:    Truitt Merle, NP  Cecilie Kicks, NP  Kathyrn Drown, NP    Other Instructions

## 2019-12-01 LAB — CBC
Hematocrit: 34.6 % — ABNORMAL LOW (ref 37.5–51.0)
Hemoglobin: 11.3 g/dL — ABNORMAL LOW (ref 13.0–17.7)
MCH: 30.2 pg (ref 26.6–33.0)
MCHC: 32.7 g/dL (ref 31.5–35.7)
MCV: 93 fL (ref 79–97)
Platelets: 151 10*3/uL (ref 150–450)
RBC: 3.74 x10E6/uL — ABNORMAL LOW (ref 4.14–5.80)
RDW: 13.1 % (ref 11.6–15.4)
WBC: 7.7 10*3/uL (ref 3.4–10.8)

## 2019-12-01 LAB — BASIC METABOLIC PANEL
BUN/Creatinine Ratio: 28 — ABNORMAL HIGH (ref 10–24)
BUN: 46 mg/dL — ABNORMAL HIGH (ref 8–27)
CO2: 21 mmol/L (ref 20–29)
Calcium: 9.1 mg/dL (ref 8.6–10.2)
Chloride: 99 mmol/L (ref 96–106)
Creatinine, Ser: 1.67 mg/dL — ABNORMAL HIGH (ref 0.76–1.27)
GFR calc Af Amer: 43 mL/min/{1.73_m2} — ABNORMAL LOW (ref >59)
GFR calc non Af Amer: 37 mL/min/{1.73_m2} — ABNORMAL LOW (ref >59)
Glucose: 183 mg/dL — ABNORMAL HIGH (ref 65–99)
Potassium: 4 mmol/L (ref 3.5–5.2)
Sodium: 137 mmol/L (ref 134–144)

## 2019-12-01 LAB — PRO B NATRIURETIC PEPTIDE: NT-Pro BNP: 2277 pg/mL — ABNORMAL HIGH (ref 0–486)

## 2019-12-01 LAB — TSH: TSH: 0.699 u[IU]/mL (ref 0.450–4.500)

## 2019-12-04 ENCOUNTER — Telehealth: Payer: Self-pay | Admitting: *Deleted

## 2019-12-04 DIAGNOSIS — I5042 Chronic combined systolic (congestive) and diastolic (congestive) heart failure: Secondary | ICD-10-CM

## 2019-12-04 MED ORDER — BIDIL 20-37.5 MG PO TABS: 1.0000 | ORAL_TABLET | Freq: Two times a day (BID) | ORAL | 3 refills | Status: DC

## 2019-12-04 MED ORDER — SPIRONOLACTONE 25 MG PO TABS: 12.5000 mg | ORAL_TABLET | ORAL | 3 refills | Status: DC

## 2019-12-04 NOTE — Telephone Encounter (Signed)
Spoke with pt's wife and reviewed results and recommendations.  Pt will come for labs on 4/29.  Wife advised to monitor BP and let us know if getting too low.  Wife verbalized understanding and was in agreement with this plan.

## 2019-12-04 NOTE — Telephone Encounter (Signed)
-----   Message from Belva Crome, MD sent at 12/01/2019  2:21 PM EDT ----- Let the patient know the labs are good. Needs Aldactone M, W, F 12.5 mg each dose. Also add Bidil 20/37.5 mg BID. BMET 1 month A copy will be sent to [place], [place], DO

## 2019-12-05 ENCOUNTER — Other Ambulatory Visit: Payer: Self-pay

## 2019-12-05 ENCOUNTER — Other Ambulatory Visit (INDEPENDENT_AMBULATORY_CARE_PROVIDER_SITE_OTHER): Payer: Medicare Other

## 2019-12-05 DIAGNOSIS — Z794 Long term (current) use of insulin: Secondary | ICD-10-CM | POA: Diagnosis not present

## 2019-12-05 DIAGNOSIS — E1165 Type 2 diabetes mellitus with hyperglycemia: Secondary | ICD-10-CM

## 2019-12-05 LAB — COMPREHENSIVE METABOLIC PANEL
ALT: 14 U/L (ref 0–53)
AST: 19 U/L (ref 0–37)
Albumin: 4 g/dL (ref 3.5–5.2)
Alkaline Phosphatase: 66 U/L (ref 39–117)
BUN: 46 mg/dL — ABNORMAL HIGH (ref 6–23)
CO2: 26 mEq/L (ref 19–32)
Calcium: 9 mg/dL (ref 8.4–10.5)
Chloride: 101 mEq/L (ref 96–112)
Creatinine, Ser: 1.59 mg/dL — ABNORMAL HIGH (ref 0.40–1.50)
GFR: 41.72 mL/min — ABNORMAL LOW (ref >60.00)
Glucose, Bld: 179 mg/dL — ABNORMAL HIGH (ref 70–99)
Potassium: 3.8 mEq/L (ref 3.5–5.1)
Sodium: 135 mEq/L (ref 135–145)
Total Bilirubin: 0.5 mg/dL (ref 0.2–1.2)
Total Protein: 6.7 g/dL (ref 6.0–8.3)

## 2019-12-05 LAB — HEMOGLOBIN A1C: Hgb A1c MFr Bld: 8.1 % — ABNORMAL HIGH (ref 4.6–6.5)

## 2019-12-10 ENCOUNTER — Telehealth (INDEPENDENT_AMBULATORY_CARE_PROVIDER_SITE_OTHER): Payer: Medicare Other | Admitting: Endocrinology

## 2019-12-10 ENCOUNTER — Other Ambulatory Visit: Payer: Self-pay

## 2019-12-10 ENCOUNTER — Telehealth: Payer: Self-pay | Admitting: Interventional Cardiology

## 2019-12-10 DIAGNOSIS — E1165 Type 2 diabetes mellitus with hyperglycemia: Secondary | ICD-10-CM

## 2019-12-10 DIAGNOSIS — Z794 Long term (current) use of insulin: Secondary | ICD-10-CM | POA: Diagnosis not present

## 2019-12-10 DIAGNOSIS — N183 Chronic kidney disease, stage 3 unspecified: Secondary | ICD-10-CM

## 2019-12-10 DIAGNOSIS — E782 Mixed hyperlipidemia: Secondary | ICD-10-CM

## 2019-12-10 NOTE — Telephone Encounter (Signed)
Seems he has been all by dill for at least a week.  Did he start feeling weak and experiencing nausea when the medication was started?  Or, is the weakness and nausea more acute over the last day or two?  I have no problem with stopping b BIDIL we need to establish whether or not symptoms are related or coming from something else.  I would recommend stopping the medication and determining if he feels better over the next several days thereafter.  We may decide to rechallenge with the medication if he took it for a week without side effects before symptoms started

## 2019-12-10 NOTE — Telephone Encounter (Signed)
Pt c/o medication issue:  1. Name of Medication: isosorbide-hydrALAZINE (BIDIL) 20-37.5 MG tablet  2. How are you currently taking this medication (dosage and times per day)? Did not take this morning  3. Are you having a reaction (difficulty breathing--STAT)? Yes   4. What is your medication issue? Todd Romero is calling stating Todd Romero is having a reaction to Bidil. She states it is causing him to be excessively weak, nauseated, and dizzy. Todd Romero states she would like it if Todd Romero stopped taking the medication all together and is prescribed something else. He has not yet taken the medication today, but Todd Romero states it advises on the bottle not to stop the medication without notifying and being advised your Doctor first. Please advise.

## 2019-12-10 NOTE — Progress Notes (Signed)
Patient ID: Todd Romero, male   DOB: 11-25-1933, 86 y.o.   MRN: [id number]    Reason for Appointment:   Follow-up   I connected with the above-named patient by video enabled telemedicine application and verified that I am speaking with the correct person. The patient was explained the limitations of evaluation and management by telemedicine and the availability of in person appointments.  Patient also understood that there may be a patient responsible charge related to this service . Location of the patient: Patient's home . Location of the provider: Physician office Only the patient and myself were participating in the encounter The patient understood the above statements and agreed to proceed.   History of Present Illness   Diagnosis: Type 2 DIABETES MELITUS, date of diagnosis:  1997   He has been on various regimens for his diabetes and has been on bedtime insulin with NPH since 2005 He also has benefited from adding Victoza in 2011 with better postprandial control and some weight loss Previously his weight has been as much as 247 pounds  On Victoza  since 11/14 with further improvement in blood sugar control. Since about 2015 his blood sugars have been overall mildly high with A1c over 7% consistently.  RECENT HISTORY:  His A1c is higher than usual at 8.1   Non-insulin hypoglycemic drugs: Amaryl 1 mg am., Victoza 1.8 mg daily        Side effects from medications: None  Insulin regimen: NPH 26 at bedtime, Humalog 12 units at lunch and suppertime    Current management, blood sugar patterns and problems identified:  He was apparently having higher blood sugars after lunch and he had called about readings up to about 250  He was told to start taking Humalog at lunchtime also and he has done so at least for the last week or so  However he stopped checking his blood sugars in the afternoons and not clear what his readings are at that time  He also apparently  was eating some meals sent from outside reportedly through his insurance company plan but they were having more rice and sweets like Jell-O  He thinks his morning sugars are higher and he has gone up at least 4 units on his bedtime NPH also  More recently blood sugars at bedtime are reasonably good  He thinks he is eating about the same type of meal at lunch and dinner and taking about the same insulin without any adjustment based on his portions are carbohydrate intake  After BREAKFAST his blood sugar was in the lab 179 about 3 hours after his meal but he has not checked any readings after breakfast  Previously was told to cut back on carbohydrates in the morning  As before is not able to exercise much  Monitors blood glucose:about 2x a day.    Glucometer:  Accu-Chek         Blood Glucose readings from meter download:    PRE-MEAL Fasting Lunch Dinner Bedtime Overall  Glucose range:  73-167    106-185   Mean/median:     ?   Previous readings:  PRE-MEAL Fasting Lunch Dinner Bedtime Overall  Glucose range: 82-165    109-275 82-275  Mean/median: 114    184 149    Meal times: Dinner about 5-6 PM  Wt Readings from Last 3 Encounters:  11/30/19 228 lb 3.2 oz (103.5 kg)  11/07/19 220 lb (99.8 kg)  11/01/19 228 lb (103.4 kg)   LABS:  Lab Results  Component Value Date   HGBA1C 8.1 (H) 12/05/2019   HGBA1C 7.4 (H) 09/03/2019   HGBA1C 7.7 (H) 05/28/2019   Lab Results  Component Value Date   MICROALBUR 0.7 02/13/2019   LDLCALC 48 02/13/2019   CREATININE 1.59 (H) 12/05/2019       OTHER active problems are discussed in review of systems    Allergies as of 12/10/2019      Reactions   Bactrim [sulfamethoxazole-trimethoprim] Nausea And Vomiting, Other (See Comments)   Bleeding and ulcers   Morphine And Related Nausea And Vomiting      Medication List       Accurate as of December 10, 2019  9:42 AM. If you have any questions, ask your nurse or doctor.        Accu-Chek Aviva  Plus test strip Generic drug: glucose blood USE AS INSTRUCTED TO CHECK BLOOD SUGAR TWICE A DAY DX:E11.65   acetaminophen 500 MG tablet Commonly known as: TYLENOL Take 500 mg by mouth every 4 (four) hours as needed for moderate pain or headache.   amiodarone 200 MG tablet Commonly known as: PACERONE Take 0.5 tablets (100 mg total) by mouth daily.   apixaban 2.5 MG Tabs tablet Commonly known as: Eliquis Take 1 tablet (2.5 mg total) by mouth 2 (two) times daily.   B-D UF III MINI PEN NEEDLES 31G X 5 MM Misc Generic drug: Insulin Pen Needle USE 2 PEN NEEDLE PER DAY WITH HUMALOG AND VICTOZA   BD Insulin Syringe U/F 31G X 5/16" 1 ML Misc Generic drug: Insulin Syringe-Needle U-100 USE TO INJECT INSULIN DAILY   BiDil 20-37.5 MG tablet Generic drug: isosorbide-hydrALAZINE Take 1 tablet by mouth in the morning and at bedtime.   ferrous sulfate 325 (65 FE) MG tablet Take 325 mg by mouth 2 (two) times daily with a meal.   furosemide 40 MG tablet Commonly known as: LASIX Take 1 tablet (40 mg total) by mouth 2 (two) times daily.   glimepiride 1 MG tablet Commonly known as: AMARYL Take 1 mg by mouth daily with breakfast.   HumaLOG KwikPen 100 UNIT/ML KwikPen Generic drug: insulin lispro Inject 12 Units into the skin in the morning and at bedtime. Inject 12 units under the skin before lunch and dinner.   insulin NPH Human 100 UNIT/ML injection Commonly known as: HumuLIN N Inject 26 units of Humulin N under the skin once daily at bedtime.   metolazone 5 MG tablet Commonly known as: ZAROXOLYN Taken on tablet by mouth daily on Monday and Thursday.  Take 30 minutes prior to Furosemide.   multivitamin with minerals Tabs tablet Take 1 tablet by mouth daily. Mens One a day Vit   pantoprazole 40 MG tablet Commonly known as: PROTONIX Take 1 tablet (40 mg total) by mouth 2 (two) times daily.   pravastatin 80 MG tablet Commonly known as: PRAVACHOL Take 1 tablet (80 mg total) by mouth  every evening.   PRESERVISION AREDS 2 PO Take 1 capsule by mouth 2 (two) times daily.   spironolactone 25 MG tablet Commonly known as: ALDACTONE Take 0.5 tablets (12.5 mg total) by mouth every Monday, Wednesday, and Friday.   tamsulosin 0.4 MG Caps capsule Commonly known as: FLOMAX Take 1 capsule daily   Victoza 18 MG/3ML Sopn Generic drug: liraglutide INJECT 1.8 MG UNDER THE SKIN AT BEDTIME   vitamin C 500 MG tablet Commonly known as: ASCORBIC ACID Take 500 mg by mouth daily.   Vitamin D 50 MCG (2000  UT) tablet Take 2,000 Units by mouth daily.       Allergies:  Allergies  Allergen Reactions  . Bactrim [Sulfamethoxazole-Trimethoprim] Nausea And Vomiting and Other (See Comments)    Bleeding and ulcers  . Morphine And Related Nausea And Vomiting    Past Medical History:  Diagnosis Date  . Anemia   . Arthritis   . CAD (coronary artery disease)    a. cardiac cath 12/2016 showing moderate AS, elevated LVEDP, heavy 3V coronary calcification, 100% mD2, 30-50% LAD, 50-90% stenosis of Cx proximal to origin of L-PDA, RCA not engaged due to poor catheter control (difficult procedure) - coronary status was essentially unchanged from prior.  . Chicken pox   . Chronic diastolic CHF (congestive heart failure) ([place])   . Colon polyps   . Degenerative arthritis   . Diabetes ([place])   . Dyspnea   . Essential hypertension   . Heart disease   . Heme positive stool 11/16/2017  . Hyperlipidemia   . New onset a-fib ([place]) 03/04/2018  . Obesity (BMI 30-39.9) 06/18/2015  . OSA (obstructive sleep apnea)    Severe with AHI 27/hr now on CPAP  not compliant with treatment.  . Peritonitis ([place]) 1985?  [name] Pneumonia    "6 months - 86 years old"  . Pure hypercholesterolemia   . RBBB   . [facility] [place] spotted fever   . 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 Severe  aortic stenosis    a. s/p TAVR 03/2017.  . Type II or unspecified type diabetes mellitus without mention of complication, not stated as uncontrolled   . Varicose vein of leg     Past Surgical History:  Procedure Laterality Date  . APPENDECTOMY  1985   peritonitis  . CARDIOVERSION N/A 04/03/2018   Procedure: CARDIOVERSION;  Surgeon: Josue Hector, MD;  Location: [facility] ENDOSCOPY;  Service: Cardiovascular;  Laterality: N/A;  . CARDIOVERSION N/A 01/18/2019   Procedure: CARDIOVERSION;  Surgeon: Skeet Latch, MD;  Location: [facility];  Service: Cardiovascular;  Laterality: N/A;  . CARPAL TUNNEL RELEASE    . COLONOSCOPY WITH PROPOFOL N/A 11/18/2017   Procedure: COLONOSCOPY WITH PROPOFOL;  Surgeon: Carol Ada, MD;  Location: [facility] ENDOSCOPY;  Service: Endoscopy;  Laterality: N/A;  . ESOPHAGOGASTRODUODENOSCOPY N/A 11/18/2017   Procedure: ESOPHAGOGASTRODUODENOSCOPY (EGD);  Surgeon: Carol Ada, MD;  Location: [facility] ENDOSCOPY;  Service: Endoscopy;  Laterality: N/A;  . EYE SURGERY Bilateral    cataracts removed, lens placed  . JOINT REPLACEMENT    . KNEE CARTILAGE SURGERY    . PACEMAKER IMPLANT N/A 10/25/2019   Procedure: PACEMAKER IMPLANT;  Surgeon: Evans Lance, MD;  Location: [place] CV LAB;  Service: Cardiovascular;  Laterality: N/A;  . PARTIAL COLECTOMY    . RIGHT/LEFT HEART CATH AND CORONARY ANGIOGRAPHY N/A 12/28/2016   Procedure: Right/Left Heart Cath and Coronary Angiography;  Surgeon: Belva Crome, MD;  Location: [place] CV LAB;  Service: Cardiovascular;  Laterality: N/A;  . TEE WITHOUT CARDIOVERSION N/A 03/15/2017   Procedure: TRANSESOPHAGEAL ECHOCARDIOGRAM (TEE);  Surgeon: Burnell Blanks, MD;  Location: [facility];  Service: Open Heart Surgery;  Laterality: N/A;  . TONSILLECTOMY AND ADENOIDECTOMY  1942  . TOTAL KNEE ARTHROPLASTY Right 11/08/2017  . TOTAL KNEE ARTHROPLASTY Right 11/08/2017   Procedure: TOTAL KNEE ARTHROPLASTY;  Surgeon: Melrose Nakayama, MD;  Location: [facility];  Service: Orthopedics;  Laterality: Right;  .  TRANSCATHETER AORTIC VALVE REPLACEMENT, TRANSFEMORAL N/A 03/15/2017   Procedure: TRANSCATHETER AORTIC VALVE REPLACEMENT, TRANSFEMORAL;  Surgeon: Burnell Blanks, MD;  Location: [facility];  Service: Open Heart Surgery;  Laterality: N/A;  . VEIN SURGERY  1980    Family History  Problem Relation Age of Onset  . Heart failure Father   . Emphysema Father   . COPD Father   . Early death Father   . Heart attack Father   . Hypertension Mother   . Early death Mother   . Hyperlipidemia Mother   . Stroke Mother   . Cancer Sister   . Hypertension Sister   . Healthy Sister     Social History:  reports that he has never smoked. He has never used smokeless tobacco. He reports that he does not drink alcohol or use drugs.  Review of Systems:     HYPERTENSION:   Has been being treated with biotin, Benicar was stopped Also on Zaroxolyn and Lasix Followed by cardiologist    BP Readings from Last 3 Encounters:  11/30/19 132/68  11/01/19 134/72  10/26/19 (!) 143/69   Renal dysfunction: Not associated with microalbuminuria  Creatinine history:  Lab Results  Component Value Date   CREATININE 1.59 (H) 12/05/2019   CREATININE 1.67 (H) 11/30/2019   CREATININE 2.19 (H) 11/13/2019    CARDIAC history:   No shortness of breath on exertion  He was given BiDil for CHF and appears that his BNP is consistently high  HYPERLIPIDEMIA: The lipid abnormality consists of elevated LDL and he is taking 80 mg Pravachol,  LDL is below 70 Labs as follows:   Lab Results  Component Value Date   CHOL 112 02/13/2019   HDL 48.10 02/13/2019   LDLCALC 48 02/13/2019   LDLDIRECT 101.0 04/26/2016   TRIG 84.0 02/13/2019   CHOLHDL 2 02/13/2019     IRON deficiency anemia: He has been asked to follow-up with his PCP  Also has renal insufficiency without proteinuria  No history of B-12 deficiency  Hemoccult negative  CBC Latest Ref Rng & Units  11/30/2019 10/26/2019 10/24/2019  WBC 3.4 - 10.8 x10E3/uL 7.7 7.8 10.3  Hemoglobin 13.0 - 17.7 g/dL 11.3(L) 10.4(L) 11.6(L)  Hematocrit 37.5 - 51.0 % 34.6(L) 31.4(L) 35.7(L)  Platelets 150 - 450 x10E3/uL 151 134(L) 132(L)   Lab Results  Component Value Date   OCCULTBLD NEGATIVE 10/25/2019   OCCULTBLD Negative 06/26/2018   OCCULTBLD POSITIVE (A) 11/16/2017      ROS    Physical Exam    There were no vitals taken for this visit.      ASSESSMENT/ PLAN:   Diabetes type 2 with obesity  See history of present illness for detailed discussion of his current management, blood sugar patterns and problems identified  His A1c is now 8.1  His blood sugars have been fluctuating and generally higher but recent blood sugars are only being checked after waking up and before bedtime and not clear what his other postprandial readings are, meter was not downloaded for this visit Metformin has been stopped because of variable renal function Despite taking Victoza he still appears to be getting more significant postprandial hyperglycemia He is not aware that he needs to check his blood sugars consistently after all of his meals and the fact that he can take Humalog with every meal when he is having high blood sugars Discussed blood sugar targets after meals Also needs to avoid increasing his bedtime NPH based on bedtime readings and only target the  morning readings  Recommendations: To check readings 2 hours at least once a day after either breakfast or lunch May do fewer blood sugars in the morning and bedtime If he is having difficulty with blood sugars over 180 consistently after breakfast he will need to start 4 units of NovoLog as discussed in the past visit Also adjusted lunch and suppertime dose based on his postprandial readings also Reduce NPH to 24 units for now Make sure he takes Humalog before starting to eat Avoid high glycemic index foods like cereal and rice which appear to be  increasing his blood sugars He can ride the stationary bike for exercise as tolerated   HYPERTENSION: Blood pressure followed by cardiologist recently and apparently is recently having lower blood pressures with BiDil  RENAL dysfunction: This is relatively better  Amiodarone therapy: normal TSH   ANEMIA: Relatively stable     There are no Patient Instructions on file for this visit.   [name] 12/10/2019, 9:42 AM

## 2019-12-10 NOTE — Telephone Encounter (Signed)
Spoke with wife and made her aware that I have sent the message to Dr. Tamala Julian for review.  Pt still felt bad this afternoon so he took at Bidil at 3pm to see if it made him feel better.  As of right now, not feeling better but not feeling worse.  Wife states pt was very weak and fatigued over the weekend.  Pt was up all night due to nausea.  Had intermittent episodes of dizziness.  Advised once we hear back from Dr. Tamala Julian, we will contact her with recommendations.  Pt planning to leave Wednesday to go out of state to see family.

## 2019-12-10 NOTE — Telephone Encounter (Signed)
Will route to Dr. Smith for review and advisement.  

## 2019-12-10 NOTE — Telephone Encounter (Signed)
Follow up    Pts wife is calling back  She says they dont know about the medication and if he should stop taking it  Please advise

## 2019-12-11 NOTE — Telephone Encounter (Signed)
Left message to call back  

## 2019-12-11 NOTE — Telephone Encounter (Signed)
The patient's wife states symptoms started just hours after his first dose of Bidil. His BP dropped and he had to eat lots of salty snacks and stay hydrated. He skipped a dose a couple days later but wanted to make sure it was safe to stop.  He did not take any doses last night or today and feels much better. They correlate his symptoms with Bidil administration. Informed the patient's wife that he may STOP BIDIL per Dr. Tamala Julian. They will monitor BP and symptoms and call if symptoms do not continue to improve off the medication. The patient's wife was grateful for call and agrees with treatment plan.  Med list updated.

## 2019-12-11 NOTE — Telephone Encounter (Signed)
Follow Up: ° ° °Pt is returning your call from today.. °

## 2019-12-18 ENCOUNTER — Other Ambulatory Visit: Payer: Self-pay | Admitting: Interventional Cardiology

## 2019-12-18 NOTE — Telephone Encounter (Signed)
Eliquis 2.5mg  refill request received. Patient is 86 years old, weight-103.5kg, Crea-1.59 on 11/07/2019, Diagnosis-Afib, and last seen by Dr. Tamala Julian on 3/26/20201. Dose is appropriate based on dosing criteria. Will send in refill to requested pharmacy.

## 2019-12-21 ENCOUNTER — Other Ambulatory Visit: Payer: Self-pay | Admitting: Endocrinology

## 2020-01-02 ENCOUNTER — Other Ambulatory Visit: Payer: Self-pay

## 2020-01-02 ENCOUNTER — Ambulatory Visit (INDEPENDENT_AMBULATORY_CARE_PROVIDER_SITE_OTHER): Payer: Medicare Other | Admitting: Family Medicine

## 2020-01-02 ENCOUNTER — Encounter: Payer: Self-pay | Admitting: Family Medicine

## 2020-01-02 VITALS — BP 142/80 | HR 77 | Temp 98.3°F | Resp 18 | Ht 67.0 in | Wt 230.0 lb

## 2020-01-02 DIAGNOSIS — Z7901 Long term (current) use of anticoagulants: Secondary | ICD-10-CM

## 2020-01-02 DIAGNOSIS — N400 Enlarged prostate without lower urinary tract symptoms: Secondary | ICD-10-CM

## 2020-01-02 DIAGNOSIS — N1832 Chronic kidney disease, stage 3b: Secondary | ICD-10-CM | POA: Diagnosis not present

## 2020-01-02 DIAGNOSIS — E669 Obesity, unspecified: Secondary | ICD-10-CM

## 2020-01-02 DIAGNOSIS — I5042 Chronic combined systolic (congestive) and diastolic (congestive) heart failure: Secondary | ICD-10-CM | POA: Diagnosis not present

## 2020-01-02 DIAGNOSIS — Z950 Presence of cardiac pacemaker: Secondary | ICD-10-CM

## 2020-01-02 DIAGNOSIS — I25119 Atherosclerotic heart disease of native coronary artery with unspecified angina pectoris: Secondary | ICD-10-CM

## 2020-01-02 DIAGNOSIS — Z952 Presence of prosthetic heart valve: Secondary | ICD-10-CM

## 2020-01-02 DIAGNOSIS — I4819 Other persistent atrial fibrillation: Secondary | ICD-10-CM | POA: Diagnosis not present

## 2020-01-02 DIAGNOSIS — Z794 Long term (current) use of insulin: Secondary | ICD-10-CM

## 2020-01-02 DIAGNOSIS — D649 Anemia, unspecified: Secondary | ICD-10-CM

## 2020-01-02 DIAGNOSIS — I10 Essential (primary) hypertension: Secondary | ICD-10-CM

## 2020-01-02 DIAGNOSIS — E11319 Type 2 diabetes mellitus with unspecified diabetic retinopathy without macular edema: Secondary | ICD-10-CM | POA: Diagnosis not present

## 2020-01-02 DIAGNOSIS — I453 Trifascicular block: Secondary | ICD-10-CM

## 2020-01-02 DIAGNOSIS — I350 Nonrheumatic aortic (valve) stenosis: Secondary | ICD-10-CM

## 2020-01-02 DIAGNOSIS — E7849 Other hyperlipidemia: Secondary | ICD-10-CM

## 2020-01-02 NOTE — Patient Instructions (Addendum)
We will call you with lab results and information on sleep med.  I will forward the labs to your cardiologist.   Follow up 6 mos, sooner if needed. AS long as following with cardiology and endocrinology routinely.

## 2020-01-02 NOTE — Progress Notes (Signed)
Patient ID: Todd Romero, male  DOB: Mar 14, 1934, 86 y.o.   MRN: [id number] Patient Care Team    Relationship Specialty Notifications Start End  Ma Hillock, DO PCP - General Family Medicine  09/13/19   Belva Crome, MD PCP - Cardiology Cardiology  10/21/17   [name], MD Consulting Physician Endocrinology  09/25/19   Thompson Grayer, MD Consulting Physician Cardiology  10/01/19   Danice Goltz, MD Consulting Physician Ophthalmology  10/01/19   Monna Fam, MD Consulting Physician Ophthalmology  10/01/19   Carol Ada, MD Consulting Physician Gastroenterology  10/01/19   Evans Lance, MD Consulting Physician Cardiology  11/08/19     Chief Complaint  Patient presents with  . Chronic Kidney Disease    Subjective: Todd Romero is a 86 y.o.  male present for phonic medical condition follow-up Sleep disturbance: Patient reports he has been having a lot difficulty getting a restful night sleep.  He states he does not fall asleep well.  Once he does fall asleep he usually wakes within a few hours and has trouble falling back asleep.  Once he is able to fall back asleep in the middle the night he sleeps for a couple more hours only.  He is wondering if there is anything that he can take to help him with sleep.  Iron deficiency anemia: Last iron levels 09/25/2019 with an iron of 100, percent saturation 33 and ferritin 119.  He is taking his supplement.  Thrombocytopenia: Reviewed outside labs colleted 4 weeks ago-normal platelets at 151. Is normal range is 133-197.   Patient is prescribed eliquis.   BPH: Patient reports no urinary changes.  Doing well on Flomax 0.4 mg daily  CKD 3/4/hyperparathyroidism/vitamin D insufficiency: Viewed labs in EMR from 08/2019 and 12/05/2019> started spiro since and will need BMP. Will collect with other labs and forward to cardio.  His baseline creatinine was about 1.7, last 1.5.  Baseline GFR approximately 33.  PTH was  mildly elevated at 88 and vitamin D mildly decreased at 28.  He is taking vitamin D supplementation.   Hypertension/hyperlipidemia/chronic diastolic CHF/CAD/aortic stenosis/obesity/right bundle branch block/s/p TAVR/A. Fib/pacemaker/chronic anticoag: Pt reports compliance with amiodarone,  Zaroxolyn, Lasix, Eliquis and recently started on spironolactone- which are all prescribed by cardiology.  He had been prescribed Benicar in the past.  He follows with Dr. Tamala Julian and Dr. Rayann Heman for his cardiac conditions. Patient denies chest pain, shortness of breath, dizziness or lower extremity edema.  He states they did try him on BiDil and he became rather dizzy and they discontinued.  Pt is  prescribed statin-pravastatin 80 mg which is supplied by PCP.  Patient status post DCCV 01/18/2019.  CHA2DS2-VASc score of 6.  Type II diabetic: Pt reports compliance with Diabetic regimen per endo. PNA series: Completed 2019 Flu shot: UTD 2020 (recommneded yearly) Foot exam: Appears to be due.  Endocrine is managing diabetes.  Eye exam: Appears to be due.  Followed by Dr. Herbert Deaner and Dr. Manuella Ghazi.  Will request additional records. A1c: 7.4 > 8.1 per EMR review 12/05/2019  Echo 09/2019: IMPRESSIONS  1. Left ventricular ejection fraction, by visual estimation, is 35 to  40%. The left ventricle has moderately decreased function. There is  moderately increased left ventricular hypertrophy.  2. Elevated left atrial pressure.  3. Left ventricular diastolic parameters are consistent with Grade II  diastolic dysfunction (pseudonormalization).  4. Global right ventricle has normal systolic function.The right  ventricular size is normal. No increase  in right ventricular wall  thickness.  5. Left atrial size was moderately dilated.  6. Right atrial size was normal.  7. Moderate mitral annular calcification.  8. The mitral valve is abnormal. No evidence of mitral valve  regurgitation.  9. The tricuspid valve is normal  in structure.  10. Bioprosthetic AVR. Aortic valve regurgitation is not visualized. Mean  gradient 9 mmHg, stable from prior TTE on 03/05/18  11. The pulmonic valve was not well visualized. Pulmonic valve  regurgitation is not visualized.  12. There is mild dilatation of the ascending aorta measuring 36 mm.  13. Mildly elevated pulmonary artery systolic pressure.  14. The tricuspid regurgitant velocity is 2.67 m/s, and with an assumed  right atrial pressure of 8 mmHg, the estimated right ventricular systolic  pressure is mildly elevated at 36.5 mmHg.  15. The inferior vena cava is dilated in size with >50% respiratory  variability, suggesting right atrial pressure of 8 mmHg.  Echo 02/06/18 demonstrated  - Left ventricle: The cavity size was normal. Wall thickness was increased in a pattern of moderate LVH. The estimated ejection fraction was 43%. Diffuse hypokinesis. The study is not technically sufficient to allow evaluation of LV diastolic function. Ejection fraction (MOD, 2-plane): 43%. - Aortic valve: s/p TAVR valve. No obstruction. Trivial paravalvular leak. Mean gradient (S): 10 mm Hg. Peak gradient (S): 24 mm Hg. Valve area (VTI): 1.18 cm^2. Valve area (Vmax): 1.09 cm^2. Valve area (Vmean): 1.2 cm^2. - Left atrium: The atrium was mildly dilated. - Right ventricle: The cavity size was mildly dilated. - Atrial septum: Small atrial septal defect. - Tricuspid valve: There was moderate regurgitation. - Pulmonary arteries: PA peak pressure: 47 mm Hg (S). - Inferior vena cava: The vessel was dilated. The respirophasic diameter changes were blunted (<50%), consistent with elevated central venous pressure.  Depression screen PHQ 2/9 05/06/2017  Decreased Interest 0  Down, Depressed, Hopeless 0  PHQ - 2 Score 0  Some recent data might be hidden   No flowsheet data found.     Fall Risk  05/06/2017 01/28/2016 03/18/2014  Falls in the past year? No No No    Immunization History  Administered Date(s) Administered  . Fluad Quad(high Dose 65+) 07/06/2019  . Influenza, High Dose Seasonal PF 07/05/2017, 06/09/2018  . Influenza,inj,Quad PF,6+ Mos 06/18/2014, 05/29/2015  . Influenza-Unspecified 05/26/2016  . PFIZER SARS-COV-2 Vaccination 09/18/2019, 10/08/2019  . Pneumococcal Conjugate-13 03/18/2014  . Pneumococcal Polysaccharide-23 03/20/2005, 08/16/2018  . Tdap 11/27/2015  . Zoster 03/27/2012   No exam data present  Past Medical History:  Diagnosis Date  . Anemia   . Arthritis   . CAD (coronary artery disease)    a. cardiac cath 12/2016 showing moderate AS, elevated LVEDP, heavy 3V coronary calcification, 100% mD2, 30-50% LAD, 50-90% stenosis of Cx proximal to origin of L-PDA, RCA not engaged due to poor catheter control (difficult procedure) - coronary status was essentially unchanged from prior.  . Chicken pox   . Chronic diastolic CHF (congestive heart failure) ([place])   . Colon polyps   . Degenerative arthritis   . Diabetes ([place])   . Dyspnea   . Essential hypertension   . Heart disease   . Heme positive stool 11/16/2017  . Hyperlipidemia   . New onset a-fib ([place]) 03/04/2018  . Obesity (BMI 30-39.9) 06/18/2015  . OSA (obstructive sleep apnea)    Severe with AHI 27/hr now on CPAP  not compliant with treatment.  . Peritonitis ([place]) 1985?  [name] Pneumonia    "  6 months - 86 years old"  . Pure hypercholesterolemia   . RBBB   . [facility] spotted fever   . 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 Severe aortic stenosis    a. s/p TAVR 03/2017.  . Type II or unspecified type diabetes mellitus without mention of complication, not stated as uncontrolled   . Varicose vein of leg    Allergies  Allergen Reactions  . Bactrim [Sulfamethoxazole-Trimethoprim] Nausea And Vomiting and Other (See Comments)    Bleeding  and ulcers  . Morphine And Related Nausea And Vomiting   Past Surgical History:  Procedure Laterality Date  . APPENDECTOMY  1985   peritonitis  . CARDIOVERSION N/A 04/03/2018   Procedure: CARDIOVERSION;  Surgeon: Josue Hector, MD;  [name] END[facility]COPY;  Service: Cardiovascular;  Laterality: N/A;  . CARDIOVERSION N/A 01/18/2019   Procedure: CARDIOVERSION;  Surgeon: Skeet Latch, MD;  Location: [place];  Service: Cardiovascular;  Laterality: N/A;  . CARPAL TUNNEL RELEASE    . COLONOSCOPY WITH PROPOFOL N/A 11/18/2017   Procedure: COLONOSCOPY WITH PROPOFOL;  Surgeon: Carol Ada, MD;  Location: [facility] ENDOSCOPY;  Service: Endoscopy;  Laterality: N/A;  . ESOPHAGOGASTRODUODENOSCOPY N/A 11/18/2017   Procedure: ESOPHAGOGASTRODUODENOSCOPY (EGD);  Surgeon: Carol Ada, MD;  Location: [facility] ENDOSCOPY;  Service: Endoscopy;  Laterality: N/A;  . EYE SURGERY Bilateral    cataracts removed, lens placed  . JOINT REPLACEMENT    . KNEE CARTILAGE SURGERY    . PACEMAKER IMPLANT N/A 10/25/2019   Procedure: PACEMAKER IMPLANT;  Surgeon: Evans Lance, MD;  Location: [place] CV LAB;  Service: Cardiovascular;  Laterality: N/A;  . PARTIAL COLECTOMY    . RIGHT/LEFT HEART CATH AND CORONARY ANGIOGRAPHY N/A 12/28/2016   Procedure: Right/Left Heart Cath and Coronary Angiography;  Surgeon: Belva Crome, MD;  Location: [place] CV LAB;  Service: Cardiovascular;  Laterality: N/A;  . TEE WITHOUT CARDIOVERSION N/A 03/15/2017   Procedure: TRANSESOPHAGEAL ECHOCARDIOGRAM (TEE);  Surgeon: Burnell Blanks, MD;  Location: [place];  Service: Open Heart Surgery;  Laterality: N/A;  . TONSILLECTOMY AND ADENOIDECTOMY  1942  . TOTAL KNEE ARTHROPLASTY Right 11/08/2017  . TOTAL KNEE ARTHROPLASTY Right 11/08/2017   Procedure: TOTAL KNEE ARTHROPLASTY;  Surgeon: Melrose Nakayama, MD;  Location: [place];  Service: Orthopedics;  Laterality: Right;  . TRANSCATHETER AORTIC VALVE REPLACEMENT, TRANSFEMORAL N/A 03/15/2017    Procedure: TRANSCATHETER AORTIC VALVE REPLACEMENT, TRANSFEMORAL;  Surgeon: Burnell Blanks, MD;  Location: [place];  Service: Open Heart Surgery;  Laterality: N/A;  . VEIN SURGERY  1980   Family History  Problem Relation Age of Onset  . Heart failure Father   . Emphysema Father   . COPD Father   . Early death Father   . Heart attack Father   . Hypertension Mother   . Early death Mother   . Hyperlipidemia Mother   . Stroke Mother   . Cancer Sister   . Hypertension Sister   . 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 If you have any questions, ask your nurse or doctor.        Accu-Chek Aviva Plus test strip Generic drug: glucose blood USE AS INSTRUCTED TO CHECK BLOOD SUGAR TWICE A DAY DX:E11.65   acetaminophen 500 MG tablet Commonly known as: TYLENOL Take 500 mg by mouth every 4 (four) hours as needed for moderate pain or headache.   amiodarone 200 MG tablet Commonly known as: PACERONE Take 0.5 tablets (100 mg total) by mouth daily.   B-D UF III MINI PEN NEEDLES 31G X 5 MM Misc Generic drug: Insulin Pen Needle USE 2 PEN NEEDLE PER DAY WITH HUMALOG AND VICTOZA   BD Insulin Syringe U/F 31G X 5/16" 1 ML Misc Generic drug: Insulin Syringe-Needle U-100 USE TO INJECT INSULIN DAILY   Eliquis 2.5 MG Tabs tablet Generic drug: apixaban TAKE 1 TABLET TWICE A DAY (DISCONTINUE 5 MG TABLET)   ferrous sulfate 325 (65 FE) MG tablet Take 325 mg by mouth 2 (two) times daily with  a meal.   furosemide 40 MG tablet Commonly known as: LASIX Take 1 tablet (40 mg total) by mouth 2 (two) times daily.   glimepiride 1 MG tablet Commonly known as: AMARYL Take 1 mg by mouth daily with breakfast.   HumaLOG KwikPen 100 UNIT/ML KwikPen Generic drug: insulin lispro Inject 12 Units into the skin in the morning and at bedtime. Inject 12 units under the skin before lunch and dinner.   insulin NPH Human 100 UNIT/ML injection Commonly known as: HumuLIN N Inject 26 units of Humulin N under the skin once daily at bedtime.   metolazone 5 MG tablet Commonly known as: ZAROXOLYN Taken on tablet by mouth daily on Monday and Thursday.  Take 30 minutes prior to Furosemide.   multivitamin with minerals Tabs tablet Take 1 tablet by mouth daily. Mens One a day Vit   pantoprazole 40 MG tablet Commonly known as: PROTONIX Take 1 tablet (40 mg total) by mouth 2 (two) times daily.   pravastatin 80 MG tablet Commonly known as: PRAVACHOL Take 1 tablet (80 mg total) by mouth every evening.   PRESERVISION AREDS 2 PO Take 1 capsule by mouth 2 (two) times daily.   spironolactone 25 MG tablet Commonly known as: ALDACTONE Take 0.5 tablets (12.5 mg total) by mouth every Monday, Wednesday, and Friday.   tamsulosin 0.4 MG Caps capsule Commonly known as: FLOMAX Take 1 capsule daily   Victoza 18 MG/3ML Sopn Generic drug: liraglutide INJECT 1.8 MG UNDER THE SKIN AT BEDTIME   vitamin C 500 MG tablet Commonly known as: ASCORBIC ACID Take 500 mg by mouth daily.   Vitamin D 50 MCG (2000 UT) tablet Take 2,000 Units by mouth daily.       All past medical history, surgical history, allergies, family history, immunizations andmedications were updated in the EMR today and reviewed under the history and medication portions of their EMR.      ROS: 14 pt review of systems performed and negative (unless mentioned in an HPI)  Objective: BP (!) 142/80 (BP Location: Left Arm, Patient Position:  Sitting, Cuff Size: Large)   Pulse 77   Temp 98.3 F (36.8 C) (Temporal)   Resp 18   Ht 5\' 7"  (1.702 m)   Wt  230 lb (104.3 kg)   SpO2 98%   BMI 36.02 kg/m  Gen: Afebrile. No acute distress.  Pleasant Caucasian male. HENT: AT. [place].  Eyes:Pupils Equal Round Reactive to light, Extraocular movements intact,  Conjunctiva without redness, discharge or icterus. Neck/lymp/endocrine: Supple, no lymphadenopathy, no thyromegaly CV: RRR 1/6 SM, no edema Chest: CTAB, no wheeze or crackles Abd: Soft.NTND. BS + Neuro:  Normal gait. PERLA. EOMi. Alert. Oriented x3 Psych: Normal affect, dress and demeanor. Normal speech. Normal thought content and judgment.  Assessment/plan: Todd Romero is a 86 y.o. male present for est care Iron deficiency anemia, unspecified iron deficiency anemia type Currently supplementing with ferrous sulfate 325 mg twice daily.   Monitor every 6 months with other chronic labs.  Stage 3b chronic kidney disease PTH/calcium/vitamin D every 6 months. Renal panel every 4-6 months. Avoid NSAIDs. Renally dose medications as appropriate. - Vitamin D (25 hydroxy) - PTH, Intact and Calcium Follow-up every 6 months-as long as kidney function remains stable.  If kidney function declined will consider nephrology referral.  Thrombocytopenia/long-term anticoagulation: Monitor platelet every 6 months.  Patient on long-term anticoagulation and mildly decreased platelets. They were normal at most recent check 4 weeks ago.  Benign prostatic hyperplasia, unspecified whether lower urinary tract symptoms present Stable Continue Flomax 0.4 mg QD  Hypertension/hyperlipidemia/chronic diastolic CHF/CAD/aortic stenosis/obesity/right bundle branch block/s/p TAVR/A. fib Stable Blood pressure medications managed by cardiology.  Records reviewed. Continue routine follow-ups with Dr. Tamala Julian in A. fib clinic/Dr. Allred. Continue pravastatin.  Yearly lipids due June 2021.  Type 2 diabetes  mellitus with retinopathy, with long-term current use of insulin, macular edema presence unspecified, unspecified laterality, unspecified retinopathy severity ([place]) Managed by endocrine.  Records reviewed. Will request most current eye exam from ophthalmologist.  Sleep disturbance: Discussed different treatment options with patient today.  With his chronic medical conditions of heart arrhythmias, chronic kidney disease and age, there is no ideal safest class of medication to offer him.  He is suffering with lack of sleep therefore He may benefit from a low-dose benzodiazepine. On call back with labs will discuss medication in more detail.   - Trial of Klonopin 0.25-0.5 mg approximately 30 minutes prior to bed.  Medication will be faxed after conversation with him tomorrow.   Return in about 6 months (around 06/19/2020) for [place] (30 min). Orders Placed This Encounter  Procedures  . Basic Metabolic Panel (BMET)  . PTH, Intact and Calcium  . Vitamin D (25 hydroxy)   No orders of the defined types were placed in this encounter.    Note is dictated utilizing voice recognition software. Although note has been proof read prior to signing, occasional typographical errors still can be missed. If any questions arise, please do not hesitate to call for verification.  Electronically signed by: Howard Pouch, DO [place]

## 2020-01-03 ENCOUNTER — Other Ambulatory Visit: Payer: Medicare Other

## 2020-01-03 ENCOUNTER — Telehealth: Payer: Self-pay | Admitting: Family Medicine

## 2020-01-03 LAB — BASIC METABOLIC PANEL
BUN/Creatinine Ratio: 27 (calc) — ABNORMAL HIGH (ref 6–22)
BUN: 45 mg/dL — ABNORMAL HIGH (ref 7–25)
CO2: 25 mmol/L (ref 20–32)
Calcium: 9 mg/dL (ref 8.6–10.3)
Chloride: 100 mmol/L (ref 98–110)
Creat: 1.66 mg/dL — ABNORMAL HIGH (ref 0.70–1.11)
Glucose, Bld: 190 mg/dL — ABNORMAL HIGH (ref 65–99)
Potassium: 4 mmol/L (ref 3.5–5.3)
Sodium: 137 mmol/L (ref 135–146)

## 2020-01-03 LAB — PTH, INTACT AND CALCIUM
Calcium: 9 mg/dL (ref 8.6–10.3)
PTH: 77 pg/mL — ABNORMAL HIGH (ref 14–64)

## 2020-01-03 LAB — VITAMIN D 25 HYDROXY (VIT D DEFICIENCY, FRACTURES): Vit D, 25-Hydroxy: 30 ng/mL (ref 30–100)

## 2020-01-03 MED ORDER — CLONAZEPAM 0.5 MG PO TABS: 0.2500 mg | ORAL_TABLET | Freq: Every day | ORAL | 0 refills | Status: DC

## 2020-01-03 NOTE — Telephone Encounter (Signed)
Pt was called and given lab results, he verbalized understanding. Med was called into pharmacy, [facility].

## 2020-01-03 NOTE — Telephone Encounter (Signed)
Please inform patient the following information: His kidney function is stable.  His electrolytes are also stable.  I have forwarded those results to his cardiologist as well.  His vitamin D is low normal at 30.  Continue 2000 units of vitamin D daily for now. The other lab test will take a few days to return and we will call them with those results once available.  At that time we may change his vitamin D dosing depending upon those results.  Patient had reported during his visit that he was having great difficulty sleeping.  Weighing risks versus benefits with his chronic conditions, and medication called Klonopin may help with his sleep.

## 2020-01-24 ENCOUNTER — Telehealth: Payer: Self-pay | Admitting: Interventional Cardiology

## 2020-01-24 NOTE — Telephone Encounter (Signed)
Wife of patient was not sure how to go about sending the patient's home remote transmission. Please advise

## 2020-01-24 NOTE — Telephone Encounter (Signed)
I let the pt wife Todd Romero know that all he has to do is sleep within 4-6 ft of his monitor and it will transmit automatically.

## 2020-01-25 ENCOUNTER — Ambulatory Visit (INDEPENDENT_AMBULATORY_CARE_PROVIDER_SITE_OTHER): Payer: Medicare Other | Admitting: *Deleted

## 2020-01-25 DIAGNOSIS — R001 Bradycardia, unspecified: Secondary | ICD-10-CM

## 2020-01-25 LAB — CUP PACEART REMOTE DEVICE CHECK
Battery Remaining Longevity: 143 mo
Battery Voltage: 3.2 V
Brady Statistic AP VP Percent: 6.33 %
Brady Statistic AP VS Percent: 50.24 %
Brady Statistic AS VP Percent: 2.17 %
Brady Statistic AS VS Percent: 41.26 %
Brady Statistic RA Percent Paced: 56.35 %
Brady Statistic RV Percent Paced: 8.5 %
Date Time Interrogation Session: 20210521043330
Implantable Lead Implant Date: 20210218
Implantable Lead Implant Date: 20210218
Implantable Lead Location: 753859
Implantable Lead Location: 753860
Implantable Lead Model: 3830
Implantable Lead Model: 5076
Implantable Pulse Generator Implant Date: 20210218
Lead Channel Impedance Value: 323 Ohm
Lead Channel Impedance Value: 342 Ohm
Lead Channel Impedance Value: 494 Ohm
Lead Channel Impedance Value: 513 Ohm
Lead Channel Pacing Threshold Amplitude: 0.625 V
Lead Channel Pacing Threshold Amplitude: 0.75 V
Lead Channel Pacing Threshold Pulse Width: 0.4 ms
Lead Channel Pacing Threshold Pulse Width: 0.4 ms
Lead Channel Sensing Intrinsic Amplitude: 2.625 mV
Lead Channel Sensing Intrinsic Amplitude: 2.625 mV
Lead Channel Sensing Intrinsic Amplitude: 9.125 mV
Lead Channel Sensing Intrinsic Amplitude: 9.125 mV
Lead Channel Setting Pacing Amplitude: 3.25 V
Lead Channel Setting Pacing Amplitude: 3.25 V
Lead Channel Setting Pacing Pulse Width: 0.4 ms
Lead Channel Setting Sensing Sensitivity: 1.2 mV

## 2020-01-28 ENCOUNTER — Other Ambulatory Visit: Payer: Self-pay | Admitting: Endocrinology

## 2020-01-28 MED ORDER — INSULIN NPH (HUMAN) (ISOPHANE) 100 UNIT/ML ~~LOC~~ SUSP: SUBCUTANEOUS | 1 refills | Status: DC

## 2020-01-28 MED ORDER — INSULIN NPH (HUMAN) (ISOPHANE) 100 UNIT/ML ~~LOC~~ SUSP: SUBCUTANEOUS | 1 refills | Status: AC

## 2020-01-28 NOTE — Telephone Encounter (Signed)
Medication Refill Request  Did you call your pharmacy and request this refill first? Yes  . If patient has not contacted pharmacy first, instruct them to do so for future refills.  . Remind them that contacting the pharmacy for their refill is the quickest method to get the refill.  . Refill policy also stated that it will take anywhere between 24-72 hours to receive the refill.    Name of medication? Humulin N  Is this a 90 day supply? "same as last time"  Name and location of pharmacy?  [place], [place] Phone:  [phone]  Fax:  [phone]

## 2020-01-28 NOTE — Telephone Encounter (Signed)
Spoke to pt told him I sent Humulin N 90 day supply to [facility] for him. Pt verbalized understanding.

## 2020-01-28 NOTE — Progress Notes (Signed)
Remote pacemaker transmission.   

## 2020-01-29 NOTE — Progress Notes (Signed)
Cardiology Office Note:    Date:  01/30/2020   ID:  Todd Romero, DOB 08-01-1934, MRN [id number]  PCP:  Todd Hillock, DO  Cardiologist:  Todd Grooms, MD   Referring MD: Todd Hillock, DO   Chief Complaint  Patient presents with  . Coronary Artery Disease  . Congestive Heart Failure    History of Present Illness:    Todd Romero is a 86 y.o. male with a hx of severe AS 03/2017 treated with TAVR,, chronic diastolic heart failure, CAD with CTO of D1 and 90% LCX (cath 2013),OSA not on therapy, HTN, type 2 diabetes, hyperlipidemia, PAF,electrical cardioversion on Amiodarone-> NSR and resolution of A/C/CHF,subsequent significant bradycardia and AV conduction abnormality on amiodarone leading to permanent pacemaker therapy after near syncopal episode in February 2021.Todd Romero  He is doing well.  Weights have been stable.  He denies orthopnea, PND, lower extremity swelling, syncope.  Because of chronic kidney disease we have been unable to use renin angiotensin blockade.  I therefore attempted to use BiDil but he did not tolerate this well mostly because of headache and low blood pressure.  He is now on low-dose spironolactone and tolerating that well.  No recent blood work.  Volume status is being controlled with metolazone a couple times per week and daily furosemide.  Past Medical History:  Diagnosis Date  . AKI (acute kidney injury) ([place]) 10/24/2019  . Anemia   . Arthritis   . CAD (coronary artery disease)    a. cardiac cath 12/2016 showing moderate AS, elevated LVEDP, heavy 3V coronary calcification, 100% mD2, 30-50% LAD, 50-90% stenosis of Cx proximal to origin of L-PDA, RCA not engaged due to poor catheter control (difficult procedure) - coronary status was essentially unchanged from prior.  . Chicken pox   . Chronic diastolic CHF (congestive heart failure) ([place])   . Colon polyps   . Degenerative arthritis   . Diabetes ([place])   . Dyspnea   . Dyspnea on exertion  02/11/2015  . Essential hypertension   . Heart disease   . Heme positive stool 11/16/2017  . Hyperlipidemia   . New onset a-fib ([place]) 03/04/2018  . Obesity (BMI 30-39.9) 06/18/2015  . OSA (obstructive sleep apnea)    Severe with AHI 27/hr now on CPAP  not compliant with treatment.  . Peritonitis ([place]) 1985?  Todd Romero Pneumonia    "6 months - 86 years old"  . Pure hypercholesterolemia   . RBBB   . [facility] spotted fever   . 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 Severe aortic stenosis    a. s/p TAVR 03/2017.  . Type II or unspecified type diabetes mellitus without mention of complication, not stated as uncontrolled   . Varicose vein of leg     Past Surgical History:  Procedure Laterality Date  . APPENDECTOMY  1985   peritonitis  . CARDIOVERSION N/A 04/03/2018   Procedure: CARDIOVERSION;  Surgeon: Josue Hector, MD;  Location: [facility];  Service: Cardiovascular;  Laterality: N/A;  . CARDIOVERSION N/A 01/18/2019   Procedure: CARDIOVERSION;  Surgeon: Skeet Latch, MD;  Location: [facility];  Service: Cardiovascular;  Laterality: N/A;  . CARPAL TUNNEL RELEASE    . COLONOSCOPY WITH PROPOFOL N/A 11/18/2017   Procedure: COLONOSCOPY WITH PROPOFOL;  Surgeon: Carol Ada, MD;  Location: [facility];  Service: Endoscopy;  Laterality: N/A;  . ESOPHAGOGASTRODUODENOSCOPY  N/A 11/18/2017   Procedure: ESOPHAGOGASTRODUODENOSCOPY (EGD);  Surgeon: Carol Ada, MD;  Location: [facility];  Service: Endoscopy;  Laterality: N/A;  . EYE SURGERY Bilateral    cataracts removed, lens placed  . JOINT REPLACEMENT    . KNEE CARTILAGE SURGERY    . PACEMAKER IMPLANT N/A 10/25/2019   Procedure: PACEMAKER IMPLANT;  Surgeon: Evans Lance, MD;  Location: [facility];  Service: Cardiovascular;  Laterality: N/A;  . PARTIAL COLECTOMY    . RIGHT/LEFT HEART CATH AND  CORONARY ANGIOGRAPHY N/A 12/28/2016   Procedure: Right/Left Heart Cath and Coronary Angiography;  Surgeon: Belva Crome, MD;  Location: [place] CV [facility];  Service: Cardiovascular;  Laterality: N/A;  . TEE WITHOUT CARDIOVERSION N/A 03/15/2017   Procedure: TRANSESOPHAGEAL ECHOCARDIOGRAM (TEE);  Surgeon: Burnell Blanks, MD;  Location: [place] [facility];  Service: Open Heart Surgery;  Laterality: N/A;  . TONSILLECTOMY AND ADENOIDECTOMY  1942  . TOTAL KNEE ARTHROPLASTY Right 11/08/2017  . TOTAL KNEE ARTHROPLASTY Right 11/08/2017   Procedure: TOTAL KNEE ARTHROPLASTY;  Surgeon: Melrose Nakayama, MD;  Location: [facility];  Service: Orthopedics;  Laterality: Right;  . TRANSCATHETER AORTIC VALVE REPLACEMENT, TRANSFEMORAL N/A 03/15/2017   Procedure: TRANSCATHETER AORTIC VALVE REPLACEMENT, TRANSFEMORAL;  Surgeon: Burnell Blanks, MD;  Location: [facility];  Service: Open Heart Surgery;  Laterality: N/A;  . VEIN SURGERY  1980    Current Medications: Current Meds  Medication Sig  . ACCU-CHEK AVIVA PLUS test strip USE AS INSTRUCTED TO CHECK BLOOD SUGAR TWICE A DAY DX:E11.65  . acetaminophen (TYLENOL) 500 MG tablet Take 500 mg by mouth every 4 (four) hours as needed for moderate pain or headache.  Todd Romero amiodarone (PACERONE) 200 MG tablet Take 0.5 tablets (100 mg total) by mouth daily.  . BD INSULIN SYRINGE U/F 31G X 5/16" 1 ML MISC USE TO INJECT INSULIN DAILY  . Cholecalciferol (VITAMIN D) 50 MCG (2000 UT) tablet Take 2,000 Units by mouth daily.  . clonazePAM (KLONOPIN) 0.5 MG tablet Take 0.5 tablets (0.25 mg total) by mouth at bedtime.  Todd Romero ELIQUIS 2.5 MG TABS tablet TAKE 1 TABLET TWICE A DAY (DISCONTINUE 5 MG TABLET)  . ferrous sulfate 325 (65 FE) MG tablet Take 325 mg by mouth 2 (two) times daily with a meal.   . furosemide (LASIX) 40 MG tablet Take 1 tablet (40 mg total) by mouth 2 (two) times daily.  Todd Romero glimepiride (AMARYL) 1 MG tablet Take 1 mg by mouth daily with breakfast.  . insulin lispro (HUMALOG KWIKPEN)  100 UNIT/ML KwikPen Inject 12 Units into the skin in the morning and at bedtime. Inject 12 units under the skin before lunch and dinner.  . insulin NPH Human (HUMULIN N) 100 UNIT/ML injection Inject 26 units of Humulin N under the skin once daily at bedtime.  . Insulin Pen Needle (B-D UF III MINI PEN NEEDLES) 31G X 5 MM MISC USE 2 PEN NEEDLE PER DAY WITH HUMALOG AND VICTOZA  . metolazone (ZAROXOLYN) 5 MG tablet Taken on tablet by mouth daily on Monday and Thursday.  Take 30 minutes prior to Furosemide.  . Multiple Vitamin (MULTIVITAMIN WITH MINERALS) TABS tablet Take 1 tablet by mouth daily. Mens One a day Vit  . Multiple Vitamins-Minerals (PRESERVISION AREDS 2 PO) Take 1 capsule by mouth 2 (two) times daily.  . pantoprazole (PROTONIX) 40 MG tablet Take 1 tablet (40 mg total) by mouth 2 (two) times daily.  . pravastatin (PRAVACHOL) 80 MG tablet Take 1 tablet (80 mg total) by mouth  every evening.  Todd Romero spironolactone (ALDACTONE) 25 MG tablet Take 0.5 tablets (12.5 mg total) by mouth every Monday, Wednesday, and Friday.  . tamsulosin (FLOMAX) 0.4 MG CAPS capsule Take 1 capsule daily  . VICTOZA 18 MG/3ML SOPN INJECT 1.8 MG UNDER THE SKIN AT BEDTIME  . vitamin C (ASCORBIC ACID) 500 MG tablet Take 500 mg by mouth daily.     Allergies:   Bactrim [sulfamethoxazole-trimethoprim] and Morphine and related   Social History   Socioeconomic History  . Marital status: Married    Spouse name: Not on file  . Number of children: 4  . Years of education: Not on file  . Highest education level: Not on file  Occupational History  . Occupation: Construction-Retired  Tobacco Use  . Smoking status: Never Smoker  . Smokeless tobacco: Never Used  Substance and Sexual Activity  . Alcohol use: No  . Drug use: No  . Sexual activity: Not Currently    Partners: Female  Other Topics Concern  . 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,  retired Mining engineer:      -smoke alarm in the home:Yes     - wears seatbelt: Yes     - Feels safe in their relationships: Yes   Social Determinants of Radio broadcast assistant Strain:   . Difficulty of Paying Living Expenses:   Food Insecurity:   . Worried About Charity fundraiser in the Last Year:   . Arboriculturist in the Last Year:   Transportation Needs:   . Film/video editor (Medical):   Todd Romero Lack of Transportation (Non-Medical):   Physical Activity:   . Days of Exercise per Week:   . Minutes of Exercise per Session:   Stress:   . Feeling of Stress :   Social Connections:   . Frequency of Communication with Friends and Family:   . Frequency of Social Gatherings with Friends and Family:   . Attends Religious Services:   . Active Member of Clubs or Organizations:   . Attends Archivist Meetings:   Todd Romero Marital Status:      Family History: The patient's family history includes COPD in his father; Cancer in his sister; Early death in his father and mother; Emphysema in his father; Healthy in his sister; Heart attack in his father; Heart failure in his father; Hyperlipidemia in his mother; Hypertension in his mother and sister; Stroke in his mother.  ROS:   Please see the history of present illness.    Back and knee discomfort make it difficult for the patient to get around.  He has not fallen or had syncope.  Has low back discomfort as well.  All other systems reviewed and are negative.  EKGs/Labs/Other Studies Reviewed:    The following studies were reviewed today: No recent or new imaging data  EKG:  EKG not repeated  Recent Labs: 10/24/2019: B Natriuretic Peptide 125.0; Magnesium 2.1 11/30/2019: Hemoglobin 11.3; NT-Pro BNP 2,277; Platelets 151; TSH 0.699 12/05/2019: ALT 14 01/02/2020: BUN 45; Creat 1.66; Potassium 4.0; Sodium 137  Recent Lipid Panel    Component Value Date/Time   CHOL 112 02/13/2019 1005   TRIG 84.0 02/13/2019 1005   HDL  48.10 02/13/2019 1005   CHOLHDL 2 02/13/2019 1005   VLDL 16.8 02/13/2019 1005   LDLCALC 48 02/13/2019 1005   LDLDIRECT 101.0 04/26/2016 0915    Physical Exam:  VS:  BP (!) 144/72   Pulse 72   Ht 5\' 7"  (1.702 m)   Wt 231 lb 12.8 oz (105.1 kg)   SpO2 98%   BMI 36.31 kg/m     Wt Readings from Last 3 Encounters:  01/30/20 231 lb 12.8 oz (105.1 kg)  01/02/20 230 lb (104.3 kg)  11/30/19 228 lb 3.2 oz (103.5 kg)     GEN: Moderate obesity. No acute distress HEENT: Normal NECK: No JVD. LYMPHATICS: No lymphadenopathy CARDIAC: 6 systolic without diastolic murmur.  RRR without gallop, or edema. VASCULAR:  Normal Pulses. No bruits. RESPIRATORY:  Clear to auscultation without rales, wheezing or rhonchi  ABDOMEN: Soft, non-tender, non-distended, No pulsatile mass, MUSCULOSKELETAL: No deformity  SKIN: Warm and dry NEUROLOGIC:  Alert and oriented x 3 PSYCHIATRIC:  Normal affect   ASSESSMENT:    1. Chronic combined systolic and diastolic congestive heart failure ([place])   2. S/P TAVR (transcatheter aortic valve replacement)   3. On amiodarone therapy   4. CKD (chronic kidney disease) stage 4, GFR 15-29 ml/min (H[facility])   5. Pacemaker   6. Persistent atrial fibrillation ([place])   7. Obstructive sleep apnea   8. Educated about COVID-19 virus infection    PLAN:    In order of problems listed above:  1. Volume status is stable today.  Basic metabolic panel will be obtained along with a hemoglobin. 2. Normally functioning TAVR valve based upon auscultation. 3. Liver and lipid panel will be obtained today 4. Basic metabolic panel will be done today 5. Not discussed 6. Controlled rate and rhythm.  Still maintaining sinus rhythm based upon exam. 7. Unable to be compliant with CPAP. 8. Vaccination has been performed.  Liver and lipid panel, BMET, hemoglobin, and TSH today.     Medication Adjustments/Labs and Tests Ordered: Current medicines are reviewed at length with the patient  today.  Concerns regarding medicines are outlined above.  No orders of the defined types were placed in this encounter.  No orders of the defined types were placed in this encounter.   There are no Patient Instructions on file for this visit.   Signed, Todd Grooms, MD  01/30/2020 1:41 PM    [facility]

## 2020-01-30 ENCOUNTER — Ambulatory Visit (INDEPENDENT_AMBULATORY_CARE_PROVIDER_SITE_OTHER): Payer: Medicare Other | Admitting: Interventional Cardiology

## 2020-01-30 ENCOUNTER — Other Ambulatory Visit: Payer: Self-pay

## 2020-01-30 ENCOUNTER — Encounter: Payer: Self-pay | Admitting: Interventional Cardiology

## 2020-01-30 VITALS — BP 144/72 | HR 72 | Ht 67.0 in | Wt 231.8 lb

## 2020-01-30 DIAGNOSIS — Z950 Presence of cardiac pacemaker: Secondary | ICD-10-CM

## 2020-01-30 DIAGNOSIS — G4733 Obstructive sleep apnea (adult) (pediatric): Secondary | ICD-10-CM

## 2020-01-30 DIAGNOSIS — N184 Chronic kidney disease, stage 4 (severe): Secondary | ICD-10-CM | POA: Diagnosis not present

## 2020-01-30 DIAGNOSIS — I4819 Other persistent atrial fibrillation: Secondary | ICD-10-CM

## 2020-01-30 DIAGNOSIS — I5042 Chronic combined systolic (congestive) and diastolic (congestive) heart failure: Secondary | ICD-10-CM | POA: Diagnosis not present

## 2020-01-30 DIAGNOSIS — Z79899 Other long term (current) drug therapy: Secondary | ICD-10-CM

## 2020-01-30 DIAGNOSIS — Z952 Presence of prosthetic heart valve: Secondary | ICD-10-CM | POA: Diagnosis not present

## 2020-01-30 DIAGNOSIS — Z7189 Other specified counseling: Secondary | ICD-10-CM

## 2020-01-30 NOTE — Patient Instructions (Addendum)
Medication Instructions:  Your physician recommends that you continue on your current medications as directed. Please refer to the Current Medication list given to you today.  *If you need a refill on your cardiac medications before your next appointment, please call your pharmacy*   Lab Work: BMET, CBC, Liver, TSH and Pro BNP today  If you have labs (blood work) drawn today and your tests are completely normal, you will receive your results only by: [name] MyChart Message (if you have MyChart) OR . A paper copy in the mail If you have any lab test that is abnormal or we need to change your treatment, we will call you to review the results.   Testing/Procedures: None   Follow-Up: At [facility], you and your health needs are our priority.  As part of our continuing mission to provide you with exceptional heart care, we have created designated Provider Care Teams.  These Care Teams include your primary Cardiologist (physician) and Advanced Practice Providers (APPs -  Physician Assistants and Nurse Practitioners) who all work together to provide you with the care you need, when you need it.  We recommend signing up for the patient portal called "MyChart".  Sign up information is provided on this After Visit Summary.  MyChart is used to connect with patients for Virtual Visits (Telemedicine).  Patients are able to view lab/test results, encounter notes, upcoming appointments, etc.  Non-urgent messages can be sent to your provider as well.   To learn more about what you can do with MyChart, go to [email or web address].    Your next appointment:   3-4 month(s)  The format for your next appointment:   In Person  Provider:   You may see Sinclair Grooms, MD or one of the following Advanced Practice Providers on your designated Care Team:    Truitt Merle, NP  Cecilie Kicks, NP  Kathyrn Drown, NP    Other Instructions

## 2020-01-31 LAB — CBC
Hematocrit: 35.1 % — ABNORMAL LOW (ref 37.5–51.0)
Hemoglobin: 11.8 g/dL — ABNORMAL LOW (ref 13.0–17.7)
MCH: 31.7 pg (ref 26.6–33.0)
MCHC: 33.6 g/dL (ref 31.5–35.7)
MCV: 94 fL (ref 79–97)
Platelets: 143 10*3/uL — ABNORMAL LOW (ref 150–450)
RBC: 3.72 x10E6/uL — ABNORMAL LOW (ref 4.14–5.80)
RDW: 13 % (ref 11.6–15.4)
WBC: 7.9 10*3/uL (ref 3.4–10.8)

## 2020-01-31 LAB — PRO B NATRIURETIC PEPTIDE: NT-Pro BNP: 1776 pg/mL — ABNORMAL HIGH (ref 0–486)

## 2020-01-31 LAB — BASIC METABOLIC PANEL
BUN/Creatinine Ratio: 25 — ABNORMAL HIGH (ref 10–24)
BUN: 40 mg/dL — ABNORMAL HIGH (ref 8–27)
CO2: 22 mmol/L (ref 20–29)
Calcium: 8.9 mg/dL (ref 8.6–10.2)
Chloride: 99 mmol/L (ref 96–106)
Creatinine, Ser: 1.63 mg/dL — ABNORMAL HIGH (ref 0.76–1.27)
GFR calc Af Amer: 44 mL/min/{1.73_m2} — ABNORMAL LOW (ref >59)
GFR calc non Af Amer: 38 mL/min/{1.73_m2} — ABNORMAL LOW (ref >59)
Glucose: 165 mg/dL — ABNORMAL HIGH (ref 65–99)
Potassium: 4.3 mmol/L (ref 3.5–5.2)
Sodium: 138 mmol/L (ref 134–144)

## 2020-01-31 LAB — HEPATIC FUNCTION PANEL
ALT: 15 IU/L (ref 0–44)
AST: 21 IU/L (ref 0–40)
Albumin: 4.4 g/dL (ref 3.6–4.6)
Alkaline Phosphatase: 77 IU/L (ref 48–121)
Bilirubin Total: 0.3 mg/dL (ref 0.0–1.2)
Bilirubin, Direct: 0.13 mg/dL (ref 0.00–0.40)
Total Protein: 6.8 g/dL (ref 6.0–8.5)

## 2020-01-31 LAB — TSH: TSH: 0.718 u[IU]/mL (ref 0.450–4.500)

## 2020-02-06 ENCOUNTER — Encounter: Payer: Self-pay | Admitting: Internal Medicine

## 2020-02-06 ENCOUNTER — Ambulatory Visit (INDEPENDENT_AMBULATORY_CARE_PROVIDER_SITE_OTHER): Payer: Medicare Other | Admitting: Internal Medicine

## 2020-02-06 ENCOUNTER — Other Ambulatory Visit: Payer: Self-pay

## 2020-02-06 VITALS — BP 138/72 | HR 73 | Ht 67.0 in | Wt 232.0 lb

## 2020-02-06 DIAGNOSIS — Z950 Presence of cardiac pacemaker: Secondary | ICD-10-CM

## 2020-02-06 DIAGNOSIS — I495 Sick sinus syndrome: Secondary | ICD-10-CM

## 2020-02-06 NOTE — Patient Instructions (Signed)
Medication Instructions:  Your physician recommends that you continue on your current medications as directed. Please refer to the Current Medication list given to you today.  Labwork: None ordered.  Testing/Procedures: None ordered.  Follow-Up: Your physician wants you to follow-up in: one year with Dr. Lovena Le.   You will receive a reminder letter in the mail two months in advance. If you don't receive a letter, please call our office to schedule the follow-up appointment.  Remote monitoring is used to monitor your Pacemaker from home. This monitoring reduces the number of office visits required to check your device to one time per year. It allows [place] to keep an eye on the functioning of your device to ensure it is working properly. You are scheduled for a device check from home on 04/25/2020. You may send your transmission at any time that day. If you have a wireless device, the transmission will be sent automatically. After your physician reviews your transmission, you will receive a postcard with your next transmission date.  Any Other Special Instructions Will Be Listed Below (If Applicable).  If you need a refill on your cardiac medications before your next appointment, please call your pharmacy.

## 2020-02-06 NOTE — Progress Notes (Signed)
HPI Todd Romero returns today after undergoing PPM insertion for CHB several months ago. He also had CAD and OSA. He has PAF and has been cardioverted in the past. He has chronic renal insufficiency. He has a propensity for volume overload and takes as needed metolazone and daily furosemide. Since his PPM insertion, he notes he has done well with no chest pain or sob. He is fairly sedentary due to arthritis and advanced age. Allergies  Allergen Reactions  . Bactrim [Sulfamethoxazole-Trimethoprim] Nausea And Vomiting and Other (See Comments)    Bleeding and ulcers  . Morphine And Related Nausea And Vomiting     Current Outpatient Medications  Medication Sig Dispense Refill  . ACCU-CHEK AVIVA PLUS test strip USE AS INSTRUCTED TO CHECK BLOOD SUGAR TWICE A DAY DX:E11.65 100 strip 2  . acetaminophen (TYLENOL) 500 MG tablet Take 500 mg by mouth every 4 (four) hours as needed for moderate pain or headache.    [name] amiodarone (PACERONE) 200 MG tablet Take 0.5 tablets (100 mg total) by mouth daily. 45 tablet 3  . BD INSULIN SYRINGE U/F 31G X 5/16" 1 ML MISC USE TO INJECT INSULIN DAILY 100 each 3  . Cholecalciferol (VITAMIN D) 50 MCG (2000 UT) tablet Take 2,000 Units by mouth daily.    . clonazePAM (KLONOPIN) 0.5 MG tablet Take 0.5 tablets (0.25 mg total) by mouth at bedtime. 45 tablet 0  . ELIQUIS 2.5 MG TABS tablet TAKE 1 TABLET TWICE A DAY (DISCONTINUE 5 MG TABLET) 180 tablet 2  . ferrous sulfate 325 (65 FE) MG tablet Take 325 mg by mouth 2 (two) times daily with a meal.     . furosemide (LASIX) 40 MG tablet Take 1 tablet (40 mg total) by mouth 2 (two) times daily. 180 tablet 2  . glimepiride (AMARYL) 1 MG tablet Take 1 mg by mouth daily with breakfast.    . insulin lispro (HUMALOG KWIKPEN) 100 UNIT/ML KwikPen Inject 12 Units into the skin in the morning and at bedtime. Inject 12 units under the skin before lunch and dinner.    . insulin NPH Human (HUMULIN N) 100 UNIT/ML injection Inject 26  units of Humulin N under the skin once daily at bedtime. 90 mL 1  . Insulin Pen Needle (B-D UF III MINI PEN NEEDLES) 31G X 5 MM MISC USE 2 PEN NEEDLE PER DAY WITH HUMALOG AND VICTOZA 200 each 3  . metolazone (ZAROXOLYN) 5 MG tablet Taken on tablet by mouth daily on Monday and Thursday.  Take 30 minutes prior to Furosemide. 30 tablet 1  . Multiple Vitamin (MULTIVITAMIN WITH MINERALS) TABS tablet Take 1 tablet by mouth daily. Mens One a day Vit    . Multiple Vitamins-Minerals (PRESERVISION AREDS 2 PO) Take 1 capsule by mouth 2 (two) times daily.    . pantoprazole (PROTONIX) 40 MG tablet Take 1 tablet (40 mg total) by mouth 2 (two) times daily. 180 tablet 3  . pravastatin (PRAVACHOL) 80 MG tablet Take 1 tablet (80 mg total) by mouth every evening. 90 tablet 3  . spironolactone (ALDACTONE) 25 MG tablet Take 0.5 tablets (12.5 mg total) by mouth every Monday, Wednesday, and Friday. 21 tablet 3  . tamsulosin (FLOMAX) 0.4 MG CAPS capsule Take 1 capsule daily 90 capsule 1  . VICTOZA 18 MG/3ML SOPN INJECT 1.8 MG UNDER THE SKIN AT BEDTIME 27 mL 3  . vitamin C (ASCORBIC ACID) 500 MG tablet Take 500 mg by mouth daily.  No current facility-administered medications for this visit.     Past Medical History:  Diagnosis Date  . AKI (acute kidney injury) ([place]) 10/24/2019  . Anemia   . Arthritis   . CAD (coronary artery disease)    a. cardiac cath 12/2016 showing moderate AS, elevated LVEDP, heavy 3V coronary calcification, 100% mD2, 30-50% LAD, 50-90% stenosis of Cx proximal to origin of L-PDA, RCA not engaged due to poor catheter control (difficult procedure) - coronary status was essentially unchanged from prior.  . Chicken pox   . Chronic diastolic CHF (congestive heart failure) ([place])   . Colon polyps   . Degenerative arthritis   . Diabetes ([place])   . Dyspnea   . Dyspnea on exertion 02/11/2015  . Essential hypertension   . Heart disease   . Heme positive stool 11/16/2017  . Hyperlipidemia   . New  onset a-fib ([place]) 03/04/2018  . Obesity (BMI 30-39.9) 06/18/2015  . OSA (obstructive sleep apnea)    Severe with AHI 27/hr now on CPAP  not compliant with treatment.  . Peritonitis ([place]) 1985?  [name] Pneumonia    "6 months - 86 years old"  . Pure hypercholesterolemia   . RBBB   . [facility] spotted fever   . 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 Severe aortic stenosis    a. s/p TAVR 03/2017.  . Type II or unspecified type diabetes mellitus without mention of complication, not stated as uncontrolled   . Varicose vein of leg     ROS:   All systems reviewed and negative except as noted in the HPI.   Past Surgical History:  Procedure Laterality Date  . APPENDECTOMY  1985   peritonitis  . CARDIOVERSION N/A 04/03/2018   Procedure: CARDIOVERSION;  Surgeon: Josue Hector, MD;  Location: [facility] ENDOSCOPY;  Service: Cardiovascular;  Laterality: N/A;  . CARDIOVERSION N/A 01/18/2019   Procedure: CARDIOVERSION;  Surgeon: Skeet Latch, MD;  Location: [place];  Service: Cardiovascular;  Laterality: N/A;  . CARPAL TUNNEL RELEASE    . COLONOSCOPY WITH PROPOFOL N/A 11/18/2017   Procedure: COLONOSCOPY WITH PROPOFOL;  Surgeon: Carol Ada, MD;  Location: [facility] ENDOSCOPY;  Service: Endoscopy;  Laterality: N/A;  . ESOPHAGOGASTRODUODENOSCOPY N/A 11/18/2017   Procedure: ESOPHAGOGASTRODUODENOSCOPY (EGD);  Surgeon: Carol Ada, MD;  Location: [facility] ENDOSCOPY;  Service: Endoscopy;  Laterality: N/A;  . EYE SURGERY Bilateral    cataracts removed, lens placed  . JOINT REPLACEMENT    . KNEE CARTILAGE SURGERY    . PACEMAKER IMPLANT N/A 10/25/2019   Procedure: PACEMAKER IMPLANT;  Surgeon: Evans Lance, MD;  Location: [place] CV LAB;  Service: Cardiovascular;  Laterality: N/A;  . PARTIAL COLECTOMY    . RIGHT/LEFT HEART CATH AND CORONARY ANGIOGRAPHY N/A 12/28/2016   Procedure:  Right/Left Heart Cath and Coronary Angiography;  Surgeon: Belva Crome, MD;  Location: [place] CV LAB;  Service: Cardiovascular;  Laterality: N/A;  . TEE WITHOUT CARDIOVERSION N/A 03/15/2017   Procedure: TRANSESOPHAGEAL ECHOCARDIOGRAM (TEE);  Surgeon: Burnell Blanks, MD;  Location: [place];  Service: Open Heart Surgery;  Laterality: N/A;  . TONSILLECTOMY AND ADENOIDECTOMY  1942  . TOTAL KNEE ARTHROPLASTY Right 11/08/2017  . TOTAL KNEE ARTHROPLASTY Right 11/08/2017   Procedure: TOTAL KNEE ARTHROPLASTY;  Surgeon: Melrose Nakayama, MD;  Location: [facility];  Service: Orthopedics;  Laterality: Right;  . TRANSCATHETER AORTIC VALVE REPLACEMENT, TRANSFEMORAL N/A 03/15/2017  Procedure: TRANSCATHETER AORTIC VALVE REPLACEMENT, TRANSFEMORAL;  Surgeon: Burnell Blanks, MD;  Location: [place];  Service: Open Heart Surgery;  Laterality: N/A;  . VEIN SURGERY  1980     Family History  Problem Relation Age of Onset  . Heart failure Father   . Emphysema Father   . COPD Father   . Early death Father   . Heart attack Father   . Hypertension Mother   . Early death Mother   . Hyperlipidemia Mother   . Stroke Mother   . Cancer Sister   . Hypertension Sister   . Healthy Sister      Social History   Socioeconomic History  . Marital status: Married    Spouse name: Not on file  . Number of children: 4  . Years of education: Not on file  . Highest education level: Not on file  Occupational History  . Occupation: Construction-Retired  Tobacco Use  . Smoking status: Never Smoker  . Smokeless tobacco: Never Used  Substance and Sexual Activity  . Alcohol use: No  . Drug use: No  . Sexual activity: Not Currently    Partners: Female  Other Topics Concern  . 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 Difficulty of Paying Living Expenses:   Food Insecurity:   . Worried About Charity fundraiser in the Last Year:   . Arboriculturist in the Last Year:   Transportation Needs:   . Film/video editor (Medical):   [name] Lack of Transportation (Non-Medical):   Physical Activity:   . Days of Exercise per Week:   . Minutes of Exercise per Session:   Stress:   . Feeling of Stress :   Social Connections:   . Frequency of Communication with Friends and Family:   . Frequency of Social Gatherings with Friends and Family:   . Attends Religious Services:   . Active Member of Clubs or Organizations:   . Attends Archivist Meetings:   [name] Marital Status:   Intimate Partner Violence:   . Fear of Current or Ex-Partner:   . Emotionally Abused:   [name] Physically Abused:   . Sexually Abused:      BP 138/72   Pulse 73   Ht 5\' 7"  (1.702 m)   Wt 232 lb (105.2 kg)   SpO2 98%   BMI 36.34 kg/m   Physical Exam:  Well appearing NAD HEENT: Unremarkable Neck:  No JVD, no thyromegally Lymphatics:  No adenopathy Back:  No CVA tenderness Lungs:  Clear with no wheezes HEART:  Regular rate rhythm, no murmurs, no rubs, no clicks Abd:  soft, positive bowel sounds, no organomegally, no rebound, no guarding Ext:  2 plus pulses, no edema, no cyanosis, no clubbing Skin:  No rashes no nodules Neuro:  CN II through XII intact, motor grossly intact  EKG - nsr with first degree AV block, RBBB, and left axis  DEVICE  Normal device function.  See PaceArt for details.   Assess/Plan: 1. Heart block - he is conduction today and is only pacing 7% in the RV 2. PPM - his medtronic DDD PM  is working normally. We will recheck in several months. 3. PAF - he is maintaining NSR on low dose amiodarone. 4. HTN - his bp is minimally elevated. No change.   Mikle Bosworth.D.

## 2020-03-31 ENCOUNTER — Telehealth: Payer: Self-pay

## 2020-03-31 MED ORDER — TAMSULOSIN HCL 0.4 MG PO CAPS: ORAL_CAPSULE | ORAL | 0 refills | Status: DC

## 2020-03-31 NOTE — Telephone Encounter (Signed)
Medication was sent to pharmacy. Pt notified.

## 2020-03-31 NOTE — Telephone Encounter (Signed)
Patient Rx tamsulosin (FLOMAX) 0.4 MG CAPS capsule [facility] 90 day supply

## 2020-04-04 ENCOUNTER — Telehealth: Payer: Self-pay

## 2020-04-04 NOTE — Telephone Encounter (Signed)
Pts wife was called and appt was made for next week to discuss insomnia

## 2020-04-04 NOTE — Telephone Encounter (Signed)
Pt was seen for this condition in April (3 mos ago) He can increase to one tab nightly if he has any left.  He will need a follow up apt prior to running out of that medication to discuss and refill med (it is controlled- needs face to face visit- can be virtual).  We can always continue to increase that medication until effective dose reached, but we had to start low dose first for safety reasons.

## 2020-04-04 NOTE — Telephone Encounter (Signed)
Patients wife states clonazepam is not working. Patient will sleep for approximately 45 minutes, then he is up and down the rest of the night. Currently taking clonazepam 0.5mg , half tab nightly.

## 2020-04-11 ENCOUNTER — Telehealth: Payer: Medicare Other | Admitting: Family Medicine

## 2020-04-18 ENCOUNTER — Other Ambulatory Visit: Payer: Self-pay | Admitting: Endocrinology

## 2020-04-23 ENCOUNTER — Telehealth (INDEPENDENT_AMBULATORY_CARE_PROVIDER_SITE_OTHER): Payer: Medicare Other | Admitting: Family Medicine

## 2020-04-23 ENCOUNTER — Other Ambulatory Visit: Payer: Self-pay

## 2020-04-23 ENCOUNTER — Encounter: Payer: Self-pay | Admitting: Family Medicine

## 2020-04-23 VITALS — BP 136/78 | HR 60 | Wt 201.0 lb

## 2020-04-23 DIAGNOSIS — G479 Sleep disorder, unspecified: Secondary | ICD-10-CM | POA: Diagnosis not present

## 2020-04-23 DIAGNOSIS — N400 Enlarged prostate without lower urinary tract symptoms: Secondary | ICD-10-CM

## 2020-04-23 DIAGNOSIS — N1832 Chronic kidney disease, stage 3b: Secondary | ICD-10-CM | POA: Diagnosis not present

## 2020-04-23 MED ORDER — CLONAZEPAM 0.5 MG PO TABS: 0.5000 mg | ORAL_TABLET | Freq: Every day | ORAL | 5 refills | Status: DC

## 2020-04-23 MED ORDER — TAMSULOSIN HCL 0.4 MG PO CAPS: ORAL_CAPSULE | ORAL | 0 refills | Status: DC

## 2020-04-23 NOTE — Progress Notes (Signed)
VIRTUAL VISIT VIA VIDEO  I connected with Karim D Termine on 04/23/20 at  2:00 PM EDT by elemedicine application and verified that I am speaking with the correct person using two identifiers. Location patient: Home Location provider: [facility], Office Persons participating in the virtual visit: Patient, Dr. Raoul Pitch and R.Baker, LPN  I discussed the limitations of evaluation and management by telemedicine and the availability of in person appointments. The patient expressed understanding and agreed to proceed.   SUBJECTIVE Chief Complaint  Patient presents with  . Follow-up    Insomnia    HPI: Todd Romero is a 86 y.o. male present for  Sleep disturbance: Pt started on low dose klonopin a few months ago for sleep assistance. With his h/o cardiac history and renal impairment few classes of meds can be safely given. He start very low dose klonopin 0.25 mg QHS and  It helped him fall asleep, but he then would still wake frequently.  Prior note:  Patient reports he has been having a lot difficulty getting a restful night sleep.  He states he does not fall asleep well.  Once he does fall asleep he usually wakes within a few hours and has trouble falling back asleep.  Once he is able to fall back asleep in the middle the night he sleeps for a couple more hours only.  He is wondering if there is anything that he can take to help him with sleep.  Iron deficiency anemia: Last iron levels 09/25/2019 with an iron of 100, percent saturation 33 and ferritin 119.  He is taking his supplement.  Thrombocytopenia: Reviewed outside labs colleted 4 weeks ago-normal platelets at 151. Is normal range is 133-197.   Patient is prescribed eliquis.   BPH: Patient reports no urinary changes.  Doing well on Flomax 0.4 mg daily  CKD 3/4/hyperparathyroidism/vitamin D insufficiency:Viewed  His baseline creatinine was about 1.7, last 1.5.  Baseline GFR approximately 33.  PTH was mildly  elevated at 88 and vitamin D mildly decreased at 28.  He is taking vitamin D supplementation.    Patient Active Problem List   Diagnosis Date Noted  . Sleep disturbance 04/23/2020  . 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 Pacemaker 11/08/2019  . Chronic anticoagulation 11/08/2019  . Symptomatic bradycardia 10/24/2019  . Benign prostatic hyperplasia 10/01/2019  . Stage 3b chronic kidney disease 10/01/2019  . Secondary hypercoagulable state ([place]) 09/24/2019  . 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 Chronic combined systolic and diastolic CHF (congestive heart failure) ([place]) 03/27/2018  . Persistent atrial fibrillation ([place]) 03/04/2018  . GIB (gastrointestinal bleeding) 11/18/2017  . Anemia 11/16/2017  . Leukocytosis 11/16/2017  . 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 Posterior vitreous detachment, left 03/02/2016  . Pseudophakia of both eyes 03/02/2016  . Aortic stenosis 12/18/2015  . Obesity (BMI 30-39.9) 06/18/2015  . Intermediate stage nonexudative age-related macular degeneration of both eyes 02/27/2015  . Obstructive sleep apnea 02/11/2015  . Primary osteoarthritis of right knee 03/18/2014  . Essential hypertension, benign 06/21/2013    Class: Chronic  . RBBB   . CAD (coronary artery disease)     Class: Chronic  . Hyperlipidemia   . Type 2 diabetes mellitus ([place]) 05/02/2013  . Retinal tear, left 11/16/2011    Social History   Tobacco Use  . Smoking status: Never Smoker  . Smokeless tobacco: Never Used  Substance Use Topics  . Alcohol use: No    Current Outpatient Medications:  .  ACCU-CHEK AVIVA  PLUS test strip, USE AS INSTRUCTED TO CHECK BLOOD SUGAR TWICE A DAY DX:E11.65, Disp: 100 strip, Rfl: 2 .  acetaminophen (TYLENOL) 500 MG tablet, Take 500 mg by mouth every 4 (four) hours as needed for moderate pain or headache., Disp: , Rfl:  .  amiodarone (PACERONE) 200 MG tablet, Take 0.5 tablets (100 mg total) by mouth daily., Disp: 45  tablet, Rfl: 3 .  Cholecalciferol (VITAMIN D) 50 MCG (2000 UT) tablet, Take 2,000 Units by mouth daily., Disp: , Rfl:  .  clonazePAM (KLONOPIN) 0.5 MG tablet, Take 1-2 tablets (0.5-1 mg total) by mouth at bedtime., Disp: 60 tablet, Rfl: 5 .  ELIQUIS 2.5 MG TABS tablet, TAKE 1 TABLET TWICE A DAY (DISCONTINUE 5 MG TABLET), Disp: 180 tablet, Rfl: 2 .  ferrous sulfate 325 (65 FE) MG tablet, Take 325 mg by mouth 2 (two) times daily with a meal. , Disp: , Rfl:  .  furosemide (LASIX) 40 MG tablet, Take 1 tablet (40 mg total) by mouth 2 (two) times daily., Disp: 180 tablet, Rfl: 2 .  glimepiride (AMARYL) 1 MG tablet, Take 1 mg by mouth daily with breakfast., Disp: , Rfl:  .  insulin lispro (HUMALOG KWIKPEN) 100 UNIT/ML KwikPen, Inject 12 Units into the skin in the morning and at bedtime. Inject 12 units under the skin before lunch and dinner., Disp: , Rfl:  .  insulin NPH Human (HUMULIN N) 100 UNIT/ML injection, Inject 26 units of Humulin N under the skin once daily at bedtime., Disp: 90 mL, Rfl: 1 .  Insulin Pen Needle (B-D UF III MINI PEN NEEDLES) 31G X 5 MM MISC, USE 2 PEN NEEDLE PER DAY WITH HUMALOG AND VICTOZA, Disp: 200 each, Rfl: 3 .  Insulin Syringe-Needle U-100 (BD INSULIN SYRINGE U/F) 31G X 5/16" 1 ML MISC, Use to inject insulin daily. DX:E11.9, Disp: 100 each, Rfl: 1 .  metolazone (ZAROXOLYN) 5 MG tablet, Taken on tablet by mouth daily on Monday and Thursday.  Take 30 minutes prior to Furosemide., Disp: 30 tablet, Rfl: 1 .  Multiple Vitamin (MULTIVITAMIN WITH MINERALS) TABS tablet, Take 1 tablet by mouth daily. Mens One a day Vit, Disp: , Rfl:  .  Multiple Vitamins-Minerals (PRESERVISION AREDS 2 PO), Take 1 capsule by mouth 2 (two) times daily., Disp: , Rfl:  .  pantoprazole (PROTONIX) 40 MG tablet, Take 1 tablet (40 mg total) by mouth 2 (two) times daily., Disp: 180 tablet, Rfl: 3 .  pravastatin (PRAVACHOL) 80 MG tablet, Take 1 tablet (80 mg total) by mouth every evening., Disp: 90 tablet, Rfl:  3 .  spironolactone (ALDACTONE) 25 MG tablet, Take 0.5 tablets (12.5 mg total) by mouth every Monday, Wednesday, and Friday., Disp: 21 tablet, Rfl: 3 .  tamsulosin (FLOMAX) 0.4 MG CAPS capsule, Take 1 capsule daily, Disp: 90 capsule, Rfl: 0 .  VICTOZA 18 MG/3ML SOPN, INJECT 1.8 MG UNDER THE SKIN AT BEDTIME, Disp: 27 mL, Rfl: 3 .  vitamin C (ASCORBIC ACID) 500 MG tablet, Take 500 mg by mouth daily., Disp: , Rfl:   Allergies  Allergen Reactions  . Bactrim [Sulfamethoxazole-Trimethoprim] Nausea And Vomiting and Other (See Comments)    Bleeding and ulcers  . Morphine And Related Nausea And Vomiting    OBJECTIVE: BP 136/78 (BP Location: Left Arm, Patient Position: Sitting, Cuff Size: Large)   Pulse 60   Wt 201 lb (91.2 kg)   BMI 31.48 kg/m  Gen: No acute distress. Nontoxic in appearance.  HENT: AT. [place].  MMM.  Eyes:Pupils  Equal Round Reactive to light, Extraocular movements intact,  Conjunctiva without redness, discharge or icterus. Neuro:  Alert. Oriented x3  Psych: Normal affect and demeanor. Normal speech. Normal thought content and judgment.  ASSESSMENT AND PLAN: Todd Romero is a 86 y.o. male present for  Iron deficiency anemia, unspecified iron deficiency anemia type Currently supplementing with ferrous sulfate 325 mg twice daily.   Monitor every 6 months with other chronic labs.  Stage 3b chronic kidney disease PTH/calcium/vitamin D every 6 months. Renal panel every 4-6 months. Avoid NSAIDs. Renally dose medications as appropriate. Follow-up every 6 months-as long as kidney function remains stable.  If kidney function declined will consider nephrology referral.  Thrombocytopenia/long-term anticoagulation: Monitor platelet every 6 months.  Patient on long-term anticoagulation and mildly decreased platelets.  Benign prostatic hyperplasia, unspecified whether lower urinary tract symptoms present stable Continue Flomax 0.4 mg  QD  Hypertension/hyperlipidemia/chronic diastolic CHF/CAD/aortic stenosis/obesity/right bundle branch block/s/p TAVR/A. fib Blood pressure medications managed by cardiology.  Records reviewed. Continue routine follow-ups with Dr. Tamala Julian in A. fib clinic/Dr. Allred. Continue pravastatin.  Yearly lipids due June 2021.  Type 2 diabetes mellitus with retinopathy, with long-term current use of insulin, macular edema presence unspecified, unspecified laterality, unspecified retinopathy severity ([place]) Managed by endocrine.  Records reviewed. Will request most current eye exam from ophthalmologist.  Sleep disturbance: Discussed different treatment options with patient today.  With his chronic medical conditions of heart arrhythmias, chronic kidney disease and age, there is no ideal safest class of medication to offer him.  - tolerated klonopin low dose. Increase to klonopin 0.5 mg - 1 mg QHS.  F/u 6 mos.      Howard Pouch, DO 04/23/2020   Return in about 5 months (around 10/07/2020) for [place] (15 min).  No orders of the defined types were placed in this encounter.  Meds ordered this encounter  Medications  . DISCONTD: clonazePAM (KLONOPIN) 0.5 MG tablet    Sig: Take 1-2 tablets (0.5-1 mg total) by mouth at bedtime.    Dispense:  60 tablet    Refill:  5  . clonazePAM (KLONOPIN) 0.5 MG tablet    Sig: Take 1-2 tablets (0.5-1 mg total) by mouth at bedtime.    Dispense:  60 tablet    Refill:  5  . tamsulosin (FLOMAX) 0.4 MG CAPS capsule    Sig: Take 1 capsule daily    Dispense:  90 capsule    Refill:  0   Referral Orders  No referral(s) requested today

## 2020-04-25 ENCOUNTER — Ambulatory Visit (INDEPENDENT_AMBULATORY_CARE_PROVIDER_SITE_OTHER): Payer: Medicare Other | Admitting: *Deleted

## 2020-04-25 DIAGNOSIS — I495 Sick sinus syndrome: Secondary | ICD-10-CM | POA: Diagnosis not present

## 2020-04-25 LAB — CUP PACEART REMOTE DEVICE CHECK
Battery Remaining Longevity: 166 mo
Battery Voltage: 3.18 V
Brady Statistic AP VP Percent: 4.82 %
Brady Statistic AP VS Percent: 54.65 %
Brady Statistic AS VP Percent: 0.99 %
Brady Statistic AS VS Percent: 39.54 %
Brady Statistic RA Percent Paced: 59.62 %
Brady Statistic RV Percent Paced: 5.81 %
Date Time Interrogation Session: 20210819213022
Implantable Lead Implant Date: 20210218
Implantable Lead Implant Date: 20210218
Implantable Lead Location: 753859
Implantable Lead Location: 753860
Implantable Lead Model: 3830
Implantable Lead Model: 5076
Implantable Pulse Generator Implant Date: 20210218
Lead Channel Impedance Value: 342 Ohm
Lead Channel Impedance Value: 342 Ohm
Lead Channel Impedance Value: 494 Ohm
Lead Channel Impedance Value: 551 Ohm
Lead Channel Pacing Threshold Amplitude: 0.625 V
Lead Channel Pacing Threshold Amplitude: 0.625 V
Lead Channel Pacing Threshold Pulse Width: 0.4 ms
Lead Channel Pacing Threshold Pulse Width: 0.4 ms
Lead Channel Sensing Intrinsic Amplitude: 2.5 mV
Lead Channel Sensing Intrinsic Amplitude: 2.5 mV
Lead Channel Sensing Intrinsic Amplitude: 9.5 mV
Lead Channel Sensing Intrinsic Amplitude: 9.5 mV
Lead Channel Setting Pacing Amplitude: 1.5 V
Lead Channel Setting Pacing Amplitude: 2.5 V
Lead Channel Setting Pacing Pulse Width: 0.4 ms
Lead Channel Setting Sensing Sensitivity: 1.2 mV

## 2020-04-28 NOTE — Progress Notes (Signed)
Remote pacemaker transmission.   

## 2020-05-04 NOTE — Progress Notes (Signed)
Cardiology Office Note:    Date:  05/07/2020   ID:  Janice Coffin Hyson, DOB Feb 07, 1934, MRN [id number]  PCP:  Ma Hillock, DO  Cardiologist:  Sinclair Grooms, MD   Referring MD: Ma Hillock, DO   Chief Complaint  Patient presents with  . Atrial Fibrillation  . Congestive Heart Failure    History of Present Illness:    Todd Romero is a 86 y.o. male with a hx of  a hx of severe AS 03/2017 treated with TAVR,, chronic diastolic heart failure, CAD with CTO of D1 and 90% LCX (cath 2013),OSA not on therapy, HTN, type 2 diabetes, hyperlipidemia, PAF,electrical cardioversion on Amiodarone-> NSR and resolution of A/C/CHF,subsequent significant bradycardia and AV conduction abnormality on amiodarone leading to permanent pacemaker therapy after near syncopal episode in February 2021.[name]  He is doing relatively well.  States he does not have the energy to do things he would like.  He has difficulty arising from a sitting position.  His legs feel weak.  He denies lower extremity pain.  There is lower extremity swelling but it is improved compared to previous.  He denies orthopnea.  He has not had syncope or falls.  He is sleeping okay.  Past Medical History:  Diagnosis Date  . AKI (acute kidney injury) ([place]) 10/24/2019  . Anemia   . Arthritis   . CAD (coronary artery disease)    a. cardiac cath 12/2016 showing moderate AS, elevated LVEDP, heavy 3V coronary calcification, 100% mD2, 30-50% LAD, 50-90% stenosis of Cx proximal to origin of L-PDA, RCA not engaged due to poor catheter control (difficult procedure) - coronary status was essentially unchanged from prior.  . Chicken pox   . Chronic diastolic CHF (congestive heart failure) ([place])   . Colon polyps   . Degenerative arthritis   . Diabetes ([place])   . Dyspnea   . Dyspnea on exertion 02/11/2015  . Essential hypertension   . Heart disease   . Heme positive stool 11/16/2017  . Hyperlipidemia   . New onset a-fib ([place]) 03/04/2018    . Obesity (BMI 30-39.9) 06/18/2015  . OSA (obstructive sleep apnea)    Severe with AHI 27/hr now on CPAP  not compliant with treatment.  . Peritonitis ([place]) 1985?  [name] Pneumonia    "6 months - 86 years old"  . Pure hypercholesterolemia   . RBBB   . [facility] spotted fever   . 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 Severe aortic stenosis    a. s/p TAVR 03/2017.  . Type II or unspecified type diabetes mellitus without mention of complication, not stated as uncontrolled   . Varicose vein of leg     Past Surgical History:  Procedure Laterality Date  . APPENDECTOMY  1985   peritonitis  . CARDIOVERSION N/A 04/03/2018   Procedure: CARDIOVERSION;  Surgeon: Josue Hector, MD;  Location: [facility] ENDOSCOPY;  Service: Cardiovascular;  Laterality: N/A;  . CARDIOVERSION N/A 01/18/2019   Procedure: CARDIOVERSION;  Surgeon: Skeet Latch, MD;  Location: [place];  Service: Cardiovascular;  Laterality: N/A;  . CARPAL TUNNEL RELEASE    . COLONOSCOPY WITH PROPOFOL N/A 11/18/2017   Procedure: COLONOSCOPY WITH PROPOFOL;  Surgeon: Carol Ada, MD;  Location: [facility] ENDOSCOPY;  Service: Endoscopy;  Laterality: N/A;  . ESOPHAGOGASTRODUODENOSCOPY N/A 11/18/2017   Procedure: ESOPHAGOGASTRODUODENOSCOPY (EGD);  Surgeon: Carol Ada, MD;  Location:  [facility] ENDOSCOPY;  Service: Endoscopy;  Laterality: N/A;  . EYE SURGERY Bilateral    cataracts removed, lens placed  . JOINT REPLACEMENT    . KNEE CARTILAGE SURGERY    . PACEMAKER IMPLANT N/A 10/25/2019   Procedure: PACEMAKER IMPLANT;  Surgeon: Evans Lance, MD;  Location: [place] CV LAB;  Service: Cardiovascular;  Laterality: N/A;  . PARTIAL COLECTOMY    . RIGHT/LEFT HEART CATH AND CORONARY ANGIOGRAPHY N/A 12/28/2016   Procedure: Right/Left Heart Cath and Coronary Angiography;  Surgeon: Belva Crome, MD;  Location: [place] CV  LAB;  Service: Cardiovascular;  Laterality: N/A;  . TEE WITHOUT CARDIOVERSION N/A 03/15/2017   Procedure: TRANSESOPHAGEAL ECHOCARDIOGRAM (TEE);  Surgeon: Burnell Blanks, MD;  Location: [place];  Service: Open Heart Surgery;  Laterality: N/A;  . TONSILLECTOMY AND ADENOIDECTOMY  1942  . TOTAL KNEE ARTHROPLASTY Right 11/08/2017  . TOTAL KNEE ARTHROPLASTY Right 11/08/2017   Procedure: TOTAL KNEE ARTHROPLASTY;  Surgeon: Melrose Nakayama, MD;  Location: [place];  Service: Orthopedics;  Laterality: Right;  . TRANSCATHETER AORTIC VALVE REPLACEMENT, TRANSFEMORAL N/A 03/15/2017   Procedure: TRANSCATHETER AORTIC VALVE REPLACEMENT, TRANSFEMORAL;  Surgeon: Burnell Blanks, MD;  Location: [place];  Service: Open Heart Surgery;  Laterality: N/A;  . VEIN SURGERY  1980    Current Medications: Current Meds  Medication Sig  . ACCU-CHEK AVIVA PLUS test strip USE AS INSTRUCTED TO CHECK BLOOD SUGAR TWICE A DAY DX:E11.65  . acetaminophen (TYLENOL) 500 MG tablet Take 500 mg by mouth every 4 (four) hours as needed for moderate pain or headache.  [name] amiodarone (PACERONE) 200 MG tablet Take 0.5 tablets (100 mg total) by mouth daily.  . Cholecalciferol (VITAMIN D) 50 MCG (2000 UT) tablet Take 2,000 Units by mouth daily.  . clonazePAM (KLONOPIN) 0.5 MG tablet Take 1-2 tablets (0.5-1 mg total) by mouth at bedtime.  [name] ELIQUIS 2.5 MG TABS tablet TAKE 1 TABLET TWICE A DAY (DISCONTINUE 5 MG TABLET)  . ferrous sulfate 325 (65 FE) MG tablet Take 325 mg by mouth 2 (two) times daily with a meal.   . furosemide (LASIX) 40 MG tablet Take 1 tablet (40 mg total) by mouth 2 (two) times daily.  [name] glimepiride (AMARYL) 1 MG tablet Take 1 mg by mouth daily with breakfast.  . insulin lispro (HUMALOG KWIKPEN) 100 UNIT/ML KwikPen Inject 12 Units into the skin in the morning and at bedtime. Inject 12 units under the skin before lunch and dinner.  . insulin NPH Human (HUMULIN N) 100 UNIT/ML injection Inject 26 units of Humulin N under  the skin once daily at bedtime.  . Insulin Pen Needle (B-D UF III MINI PEN NEEDLES) 31G X 5 MM MISC USE 2 PEN NEEDLE PER DAY WITH HUMALOG AND VICTOZA  . Insulin Syringe-Needle U-100 (BD INSULIN SYRINGE U/F) 31G X 5/16" 1 ML MISC Use to inject insulin daily. DX:E11.9  . metolazone (ZAROXOLYN) 5 MG tablet TAKEN ONE TABLET BY MOUTH ON MONDAY AND THURSDAY. TAKE 30 MINUTES PRIOR TO FUROSEMIDE.  . Multiple Vitamin (MULTIVITAMIN WITH MINERALS) TABS tablet Take 1 tablet by mouth daily. Mens One a day Vit  . Multiple Vitamins-Minerals (PRESERVISION AREDS 2 PO) Take 1 capsule by mouth 2 (two) times daily.  . pantoprazole (PROTONIX) 40 MG tablet Take 1 tablet (40 mg total) by mouth 2 (two) times daily.  . pravastatin (PRAVACHOL) 80 MG tablet Take 1 tablet (80 mg total) by mouth every evening.  [name] spironolactone (ALDACTONE) 25 MG tablet Take 0.5 tablets (  12.5 mg total) by mouth every Monday, Wednesday, and Friday.  . tamsulosin (FLOMAX) 0.4 MG CAPS capsule Take 1 capsule daily  . VICTOZA 18 MG/3ML SOPN INJECT 1.8 MG UNDER THE SKIN AT BEDTIME  . vitamin C (ASCORBIC ACID) 500 MG tablet Take 500 mg by mouth daily.     Allergies:   Bactrim [sulfamethoxazole-trimethoprim] and Morphine and related   Social History   Socioeconomic History  . Marital status: Married    Spouse name: Not on file  . Number of children: 4  . Years of education: Not on file  . Highest education level: Not on file  Occupational History  . Occupation: Construction-Retired  Tobacco Use  . Smoking status: Never Smoker  . Smokeless tobacco: Never Used  Vaping Use  . Vaping Use: Never used  Substance and Sexual Activity  . Alcohol use: No  . Drug use: No  . Sexual activity: Not Currently    Partners: Female  Other Topics Concern  . 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, retired Mining engineer:      -smoke alarm in the home:Yes     -  wears seatbelt: Yes     - Feels safe in their relationships: Yes   Social Determinants of Radio broadcast assistant Strain:   . Difficulty of Paying Living Expenses: Not on file  Food Insecurity:   . Worried About Charity fundraiser in the Last Year: Not on file  . Ran Out of Food in the Last Year: Not on file  Transportation Needs:   . Lack of Transportation (Medical): Not on file  . Lack of Transportation (Non-Medical): Not on file  Physical Activity:   . Days of Exercise per Week: Not on file  . Minutes of Exercise per Session: Not on file  Stress:   . Feeling of Stress : Not on file  Social Connections:   . Frequency of Communication with Friends and Family: Not on file  . Frequency of Social Gatherings with Friends and Family: Not on file  . Attends Religious Services: Not on file  . Active Member of Clubs or Organizations: Not on file  . Attends Archivist Meetings: Not on file  . Marital Status: Not on file     Family History: The patient's family history includes COPD in his father; Cancer in his sister; Early death in his father and mother; Emphysema in his father; Healthy in his sister; Heart attack in his father; Heart failure in his father; Hyperlipidemia in his mother; Hypertension in his mother and sister; Stroke in his mother.  ROS:   Please see the history of present illness.    Now has Klonopin for help with sleep.  It causes significant drowsiness and some confusion.  All other systems reviewed and are negative.  EKGs/Labs/Other Studies Reviewed:    The following studies were reviewed today: No new imaging or functional data.  The last device check was reviewed.  2D Doppler echocardiogram January 2021: IMPRESSIONS    1. Left ventricular ejection fraction, by visual estimation, is 35 to  40%. The left ventricle has moderately decreased function. There is  moderately increased left ventricular hypertrophy.  2. Elevated left atrial pressure.   3. Left ventricular diastolic parameters are consistent with Grade II  diastolic dysfunction (pseudonormalization).  4. Global right ventricle has normal systolic function.The right  ventricular size is normal. No increase in  right ventricular wall  thickness.  5. Left atrial size was moderately dilated.  6. Right atrial size was normal.  7. Moderate mitral annular calcification.  8. The mitral valve is abnormal. No evidence of mitral valve  regurgitation.  9. The tricuspid valve is normal in structure.  10. Bioprosthetic AVR. Aortic valve regurgitation is not visualized. Mean  gradient 9 mmHg, stable from prior TTE on 03/05/18  11. The pulmonic valve was not well visualized. Pulmonic valve  regurgitation is not visualized.  12. There is mild dilatation of the ascending aorta measuring 36 mm.  13. Mildly elevated pulmonary artery systolic pressure.  14. The tricuspid regurgitant velocity is 2.67 m/s, and with an assumed  right atrial pressure of 8 mmHg, the estimated right ventricular systolic  pressure is mildly elevated at 36.5 mmHg.  15. The inferior vena cava is dilated in size with >50% respiratory  variability, suggesting right atrial pressure of 8 mmHg.   EKG:  EKG.  June 2021 demonstrated severe first-degree AV block with bifascicular block that included right bundle and left anterior hemiblock.  Recent Labs: 10/24/2019: B Natriuretic Peptide 125.0; Magnesium 2.1 01/30/2020: ALT 15; BUN 40; Creatinine, Ser 1.63; Hemoglobin 11.8; NT-Pro BNP 1,776; Platelets 143; Potassium 4.3; Sodium 138; TSH 0.718  Recent Lipid Panel    Component Value Date/Time   CHOL 112 02/13/2019 1005   TRIG 84.0 02/13/2019 1005   HDL 48.10 02/13/2019 1005   CHOLHDL 2 02/13/2019 1005   VLDL 16.8 02/13/2019 1005   LDLCALC 48 02/13/2019 1005   LDLDIRECT 101.0 04/26/2016 0915    Physical Exam:    VS:  BP (!) 142/80   Pulse (!) 58   Ht 5\' 7"  (1.702 m)   Wt 232 lb (105.2 kg)   SpO2 97%    BMI 36.34 kg/m     Wt Readings from Last 3 Encounters:  05/07/20 232 lb (105.2 kg)  04/23/20 201 lb (91.2 kg)  02/06/20 232 lb (105.2 kg)     GEN: Obese.. No acute distress HEENT: Normal.  Bearded. NECK: No JVD. LYMPHATICS: No lymphadenopathy CARDIAC:  RRR without murmur, gallop, or edema. VASCULAR:  Normal Pulses. No bruits. RESPIRATORY:  Clear to auscultation without rales, wheezing or rhonchi  ABDOMEN: Soft, non-tender, non-distended, No pulsatile mass, MUSCULOSKELETAL: No deformity  SKIN: Warm and dry NEUROLOGIC:  Alert and oriented x 3 PSYCHIATRIC:  Normal affect   ASSESSMENT:    1. Chronic combined systolic and diastolic congestive heart failure ([place])   2. Tachycardia-bradycardia syndrome ([place])   3. Pacemaker   4. S/P TAVR (transcatheter aortic valve replacement)   5. On amiodarone therapy   6. CKD (chronic kidney disease) stage 4, GFR 15-29 ml/min (HCC)   7. Obstructive sleep apnea   8. Chronic anticoagulation   9. Educated about COVID-19 virus infection    PLAN:    In order of problems listed above:  1. Currently euvolemic to slightly volume overloaded on the current regimen that includes furosemide 40 mg twice daily Zaroxolyn 5 mg on Monday and Thursday, spironolactone 12.5 mg Monday, Wednesday, and Friday.  He is not on ARB therapy he is not on SGLT2 therapy. 2. Stable rhythm 3. Normally functioning pacer 4. Status post TAVI without significant murmur on exam. 5. Amiodarone is being used to maintain sinus rhythm.  When he returns in December, TSH and liver panel will be obtained.  Continue same dose.  When he returns, we will also do kidney function and potassium. 6. CKD is stable but  needs to be followed closely on multi diuretic regimen.  Check basic metabolic panel today. 7. He tries to be compliant with CPAP. 8. He continues on lower dose Eliquis 2.5 mg twice daily. 9. He is vaccinated and practicing mitigation to avoid breakthrough  infection.   Medication Adjustments/Labs and Tests Ordered: Current medicines are reviewed at length with the patient today.  Concerns regarding medicines are outlined above.  No orders of the defined types were placed in this encounter.  No orders of the defined types were placed in this encounter.   There are no Patient Instructions on file for this visit.   Signed, Sinclair Grooms, MD  05/07/2020 2:14 PM    [facility]

## 2020-05-05 ENCOUNTER — Other Ambulatory Visit: Payer: Self-pay | Admitting: Interventional Cardiology

## 2020-05-07 ENCOUNTER — Encounter: Payer: Self-pay | Admitting: Interventional Cardiology

## 2020-05-07 ENCOUNTER — Other Ambulatory Visit: Payer: Self-pay

## 2020-05-07 ENCOUNTER — Ambulatory Visit (INDEPENDENT_AMBULATORY_CARE_PROVIDER_SITE_OTHER): Payer: Medicare Other | Admitting: Interventional Cardiology

## 2020-05-07 VITALS — BP 142/80 | HR 58 | Ht 67.0 in | Wt 232.0 lb

## 2020-05-07 DIAGNOSIS — Z950 Presence of cardiac pacemaker: Secondary | ICD-10-CM

## 2020-05-07 DIAGNOSIS — Z7901 Long term (current) use of anticoagulants: Secondary | ICD-10-CM

## 2020-05-07 DIAGNOSIS — I5042 Chronic combined systolic (congestive) and diastolic (congestive) heart failure: Secondary | ICD-10-CM | POA: Diagnosis not present

## 2020-05-07 DIAGNOSIS — Z7189 Other specified counseling: Secondary | ICD-10-CM

## 2020-05-07 DIAGNOSIS — Z952 Presence of prosthetic heart valve: Secondary | ICD-10-CM | POA: Diagnosis not present

## 2020-05-07 DIAGNOSIS — Z79899 Other long term (current) drug therapy: Secondary | ICD-10-CM

## 2020-05-07 DIAGNOSIS — I495 Sick sinus syndrome: Secondary | ICD-10-CM

## 2020-05-07 DIAGNOSIS — N184 Chronic kidney disease, stage 4 (severe): Secondary | ICD-10-CM

## 2020-05-07 DIAGNOSIS — G4733 Obstructive sleep apnea (adult) (pediatric): Secondary | ICD-10-CM

## 2020-05-07 NOTE — Patient Instructions (Signed)
Medication Instructions:  Your physician recommends that you continue on your current medications as directed. Please refer to the Current Medication list given to you today.  *If you need a refill on your cardiac medications before your next appointment, please call your pharmacy*   Lab Work: BMET today  If you have labs (blood work) drawn today and your tests are completely normal, you will receive your results only by: [name] MyChart Message (if you have MyChart) OR . A paper copy in the mail If you have any lab test that is abnormal or we need to change your treatment, we will call you to review the results.   Testing/Procedures: None   Follow-Up: At [facility], you and your health needs are our priority.  As part of our continuing mission to provide you with exceptional heart care, we have created designated Provider Care Teams.  These Care Teams include your primary Cardiologist (physician) and Advanced Practice Providers (APPs -  Physician Assistants and Nurse Practitioners) who all work together to provide you with the care you need, when you need it.  We recommend signing up for the patient portal called "MyChart".  Sign up information is provided on this After Visit Summary.  MyChart is used to connect with patients for Virtual Visits (Telemedicine).  Patients are able to view lab/test results, encounter notes, upcoming appointments, etc.  Non-urgent messages can be sent to your provider as well.   To learn more about what you can do with MyChart, go to [email or web address].    Your next appointment:   3 month(s)  The format for your next appointment:   In Person  Provider:   You may see Sinclair Grooms, MD or one of the following Advanced Practice Providers on your designated Care Team:    Truitt Merle, NP  Cecilie Kicks, NP  Kathyrn Drown, NP    Other Instructions

## 2020-05-08 LAB — BASIC METABOLIC PANEL
BUN/Creatinine Ratio: 28 — ABNORMAL HIGH (ref 10–24)
BUN: 48 mg/dL — ABNORMAL HIGH (ref 8–27)
CO2: 24 mmol/L (ref 20–29)
Calcium: 9.5 mg/dL (ref 8.6–10.2)
Chloride: 98 mmol/L (ref 96–106)
Creatinine, Ser: 1.74 mg/dL — ABNORMAL HIGH (ref 0.76–1.27)
GFR calc Af Amer: 41 mL/min/{1.73_m2} — ABNORMAL LOW (ref >59)
GFR calc non Af Amer: 35 mL/min/{1.73_m2} — ABNORMAL LOW (ref >59)
Glucose: 147 mg/dL — ABNORMAL HIGH (ref 65–99)
Potassium: 4.3 mmol/L (ref 3.5–5.2)
Sodium: 136 mmol/L (ref 134–144)

## 2020-05-28 ENCOUNTER — Other Ambulatory Visit: Payer: Self-pay | Admitting: Endocrinology

## 2020-05-30 ENCOUNTER — Other Ambulatory Visit (INDEPENDENT_AMBULATORY_CARE_PROVIDER_SITE_OTHER): Payer: Medicare Other

## 2020-05-30 ENCOUNTER — Other Ambulatory Visit: Payer: Self-pay

## 2020-05-30 ENCOUNTER — Other Ambulatory Visit: Payer: Self-pay | Admitting: Endocrinology

## 2020-05-30 DIAGNOSIS — Z794 Long term (current) use of insulin: Secondary | ICD-10-CM | POA: Diagnosis not present

## 2020-05-30 DIAGNOSIS — E782 Mixed hyperlipidemia: Secondary | ICD-10-CM

## 2020-05-30 DIAGNOSIS — E1165 Type 2 diabetes mellitus with hyperglycemia: Secondary | ICD-10-CM

## 2020-05-30 LAB — LIPID PANEL
Cholesterol: 170 mg/dL (ref 0–200)
HDL: 46.1 mg/dL (ref >39.00)
NonHDL: 123.8
Total CHOL/HDL Ratio: 4
Triglycerides: 208 mg/dL — ABNORMAL HIGH (ref 0.0–149.0)
VLDL: 41.6 mg/dL — ABNORMAL HIGH (ref 0.0–40.0)

## 2020-05-30 LAB — HEMOGLOBIN A1C: Hgb A1c MFr Bld: 8.5 % — ABNORMAL HIGH (ref 4.6–6.5)

## 2020-05-30 LAB — BASIC METABOLIC PANEL
BUN: 60 mg/dL — ABNORMAL HIGH (ref 6–23)
CO2: 26 mEq/L (ref 19–32)
Calcium: 8.9 mg/dL (ref 8.4–10.5)
Chloride: 97 mEq/L (ref 96–112)
Creatinine, Ser: 1.9 mg/dL — ABNORMAL HIGH (ref 0.40–1.50)
GFR: 33.93 mL/min — ABNORMAL LOW (ref >60.00)
Glucose, Bld: 245 mg/dL — ABNORMAL HIGH (ref 70–99)
Potassium: 3.7 mEq/L (ref 3.5–5.1)
Sodium: 135 mEq/L (ref 135–145)

## 2020-05-30 LAB — LDL CHOLESTEROL, DIRECT: Direct LDL: 98 mg/dL

## 2020-05-31 LAB — FRUCTOSAMINE: Fructosamine: 344 umol/L — ABNORMAL HIGH (ref 0–285)

## 2020-06-01 ENCOUNTER — Other Ambulatory Visit: Payer: Self-pay | Admitting: Endocrinology

## 2020-06-03 ENCOUNTER — Other Ambulatory Visit: Payer: Self-pay

## 2020-06-03 ENCOUNTER — Ambulatory Visit (INDEPENDENT_AMBULATORY_CARE_PROVIDER_SITE_OTHER): Payer: Medicare Other | Admitting: Endocrinology

## 2020-06-03 VITALS — BP 142/80 | HR 61 | Ht 67.0 in | Wt 231.8 lb

## 2020-06-03 DIAGNOSIS — E782 Mixed hyperlipidemia: Secondary | ICD-10-CM

## 2020-06-03 DIAGNOSIS — E1165 Type 2 diabetes mellitus with hyperglycemia: Secondary | ICD-10-CM | POA: Diagnosis not present

## 2020-06-03 DIAGNOSIS — Z23 Encounter for immunization: Secondary | ICD-10-CM

## 2020-06-03 DIAGNOSIS — N1832 Chronic kidney disease, stage 3b: Secondary | ICD-10-CM | POA: Diagnosis not present

## 2020-06-03 DIAGNOSIS — Z794 Long term (current) use of insulin: Secondary | ICD-10-CM

## 2020-06-03 DIAGNOSIS — I10 Essential (primary) hypertension: Secondary | ICD-10-CM

## 2020-06-03 NOTE — Progress Notes (Signed)
Patient ID: Todd Romero, male   DOB: [birth year], 86 y.o.   MRN: [id number]    Reason for Appointment:   Follow-up    History of Present Illness   Diagnosis: Type 2 DIABETES MELITUS, date of diagnosis:  1997   He has been on various regimens for his diabetes and has been on bedtime insulin with NPH since 2005 He also has benefited from adding Victoza in 2011 with better postprandial control and some weight loss Previously his weight has been as much as 247 pounds  On Victoza  since 11/14 with further improvement in blood sugar control. Since about 2015 his blood sugars have been overall mildly high with A1c over 7% consistently.  RECENT HISTORY:  His A1c is higher than usual at 8.5   Non-insulin hypoglycemic drugs: Amaryl 1 mg am., Victoza 1.8 mg daily        Side effects from medications: None  Insulin regimen: NPH 26 at bedtime, Humalog 12 units with suppertime    Current management, blood sugar patterns and problems identified:  He was previously taking Humalog at lunch and dinnertime but is only taking this at dinnertime  This is despite eating sandwiches on some days  He still not understanding the duration of action of Humalog and the need to cover all meals with carbohydrate  Also not checking Premeal blood sugar at dinnertime which may be causing variable response of his evening blood sugars after dinner  His blood sugar after breakfast was 245 despite a fasting reading of 83 at that morning at home  Usually eating some carbohydrates like toast or grits in the morning  He thinks he is taking his Victoza regularly  FASTING glucose is somewhat variable but on an average fairly good around 140  As before is not able to exercise or do much walking, may do a little exercise bike  His weight fluctuates but is somewhat lower than last year  Monitors blood glucose:about 2x a day.    Glucometer:  Accu-Chek         Blood Glucose readings from meter  download:    PRE-MEAL Fasting Lunch Dinner Bedtime Overall  Glucose range:  83-195      Mean/median:  142    181   POST-MEAL PC Breakfast PC Lunch PC Dinner  Glucose range:    138-332  Mean/median:    228   Previously:  PRE-MEAL Fasting Lunch Dinner Bedtime Overall  Glucose range:  73-167    106-185   Mean/median:     ?    Meal times: Dinner about 5-6 PM  Wt Readings from Last 3 Encounters:  06/03/20 231 lb 12.8 oz (105.1 kg)  05/07/20 232 lb (105.2 kg)  04/23/20 201 lb (91.2 kg)   LABS: Lab Results  Component Value Date   HGBA1C 8.5 (H) 05/30/2020   HGBA1C 8.1 (H) 12/05/2019   HGBA1C 7.4 (H) 09/03/2019   Lab Results  Component Value Date   MICROALBUR 0.7 02/13/2019   LDLCALC 48 02/13/2019   CREATININE 1.90 (H) 05/30/2020       OTHER active problems are discussed in review of systems    Allergies as of 06/03/2020      Reactions   Bactrim [sulfamethoxazole-trimethoprim] Nausea And Vomiting, Other (See Comments)   Bleeding and ulcers   Morphine And Related Nausea And Vomiting      Medication List       Accurate as of June 03, 2020 11:05 AM. If you have any  questions, ask your nurse or doctor.        Accu-Chek Aviva Plus test strip Generic drug: glucose blood USE AS INSTRUCTED TO CHECK BLOOD SUGAR TWICE A DAY DX:E11.65   acetaminophen 500 MG tablet Commonly known as: TYLENOL Take 500 mg by mouth every 4 (four) hours as needed for moderate pain or headache.   amiodarone 200 MG tablet Commonly known as: PACERONE Take 0.5 tablets (100 mg total) by mouth daily.   B-D UF III MINI PEN NEEDLES 31G X 5 MM Misc Generic drug: Insulin Pen Needle USE 2 PEN NEEDLE PER DAY WITH HUMALOG AND VICTOZA   clonazePAM 0.5 MG tablet Commonly known as: KLONOPIN Take 1-2 tablets (0.5-1 mg total) by mouth at bedtime.   Eliquis 2.5 MG Tabs tablet Generic drug: apixaban TAKE 1 TABLET TWICE A DAY (DISCONTINUE 5 MG TABLET)   ferrous sulfate 325 (65 FE) MG  tablet Take 325 mg by mouth 2 (two) times daily with a meal.   furosemide 40 MG tablet Commonly known as: LASIX Take 1 tablet (40 mg total) by mouth 2 (two) times daily.   glimepiride 1 MG tablet Commonly known as: AMARYL Take 1 mg by mouth daily with breakfast.   insulin lispro 100 UNIT/ML KwikPen Commonly known as: HumaLOG KwikPen Inject 12 Units into the skin 2 (two) times daily before lunch and supper. Inject 12 units under skin before lunch and dinner. May also inject 4 unit at breakfast is sugar reading is over 100.   insulin NPH Human 100 UNIT/ML injection Commonly known as: HumuLIN N Inject 26 units of Humulin N under the skin once daily at bedtime.   Insulin Syringe-Needle U-100 31G X 5/16" 1 ML Misc Commonly known as: BD Insulin Syringe U/F Use to inject insulin daily. DX:E11.9   metolazone 5 MG tablet Commonly known as: ZAROXOLYN TAKEN ONE TABLET BY MOUTH ON MONDAY AND THURSDAY. TAKE 30 MINUTES PRIOR TO FUROSEMIDE.   multivitamin with minerals Tabs tablet Take 1 tablet by mouth daily. Mens One a day Vit   pantoprazole 40 MG tablet Commonly known as: PROTONIX Take 1 tablet (40 mg total) by mouth 2 (two) times daily.   pravastatin 80 MG tablet Commonly known as: PRAVACHOL Take 1 tablet (80 mg total) by mouth every evening.   PRESERVISION AREDS 2 PO Take 1 capsule by mouth 2 (two) times daily.   spironolactone 25 MG tablet Commonly known as: ALDACTONE Take 0.5 tablets (12.5 mg total) by mouth every Monday, Wednesday, and Friday.   tamsulosin 0.4 MG Caps capsule Commonly known as: FLOMAX Take 1 capsule daily   Victoza 18 MG/3ML Sopn Generic drug: liraglutide INJECT 1.8 MG UNDER THE SKIN AT BEDTIME   vitamin C 500 MG tablet Commonly known as: ASCORBIC ACID Take 500 mg by mouth daily.   Vitamin D 50 MCG (2000 UT) tablet Take 2,000 Units by mouth daily.       Allergies:  Allergies  Allergen Reactions  . Bactrim [Sulfamethoxazole-Trimethoprim]  Nausea And Vomiting and Other (See Comments)    Bleeding and ulcers  . Morphine And Related Nausea And Vomiting    Past Medical History:  Diagnosis Date  . AKI (acute kidney injury) ([place]) 10/24/2019  . Anemia   . Arthritis   . CAD (coronary artery disease)    a. cardiac cath 12/2016 showing moderate AS, elevated LVEDP, heavy 3V coronary calcification, 100% mD2, 30-50% LAD, 50-90% stenosis of Cx proximal to origin of L-PDA, RCA not engaged due to poor catheter control (  difficult procedure) - coronary status was essentially unchanged from prior.  . Chicken pox   . Chronic diastolic CHF (congestive heart failure) ([place])   . Colon polyps   . Degenerative arthritis   . Diabetes ([place])   . Dyspnea   . Dyspnea on exertion 02/11/2015  . Essential hypertension   . Heart disease   . Heme positive stool 11/16/2017  . Hyperlipidemia   . New onset a-fib ([place]) 03/04/2018  . Obesity (BMI 30-39.9) 06/18/2015  . OSA (obstructive sleep apnea)    Severe with AHI 27/hr now on CPAP  not compliant with treatment.  . Peritonitis ([place]) 1985?  [name] Pneumonia    "6 months - 86 years old"  . Pure hypercholesterolemia   . RBBB   . [facility] spotted fever   . 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 Severe aortic stenosis    a. s/p TAVR 03/2017.  . Type II or unspecified type diabetes mellitus without mention of complication, not stated as uncontrolled   . Varicose vein of leg     Past Surgical History:  Procedure Laterality Date  . APPENDECTOMY  1985   peritonitis  . CARDIOVERSION N/A 04/03/2018   Procedure: CARDIOVERSION;  Surgeon: Josue Hector, MD;  Location: [facility] ENDOSCOPY;  Service: Cardiovascular;  Laterality: N/A;  . CARDIOVERSION N/A 01/18/2019   Procedure: CARDIOVERSION;  Surgeon: Skeet Latch, MD;  Location: [facility];  Service: Cardiovascular;  Laterality: N/A;   . CARPAL TUNNEL RELEASE    . COLONOSCOPY WITH PROPOFOL N/A 11/18/2017   Procedure: COLONOSCOPY WITH PROPOFOL;  Surgeon: Carol Ada, MD;  Location: [facility] ENDOSCOPY;  Service: Endoscopy;  Laterality: N/A;  . ESOPHAGOGASTRODUODENOSCOPY N/A 11/18/2017   Procedure: ESOPHAGOGASTRODUODENOSCOPY (EGD);  Surgeon: Carol Ada, MD;  Location: [facility] ENDOSCOPY;  Service: Endoscopy;  Laterality: N/A;  . EYE SURGERY Bilateral    cataracts removed, lens placed  . JOINT REPLACEMENT    . KNEE CARTILAGE SURGERY    . PACEMAKER IMPLANT N/A 10/25/2019   Procedure: PACEMAKER IMPLANT;  Surgeon: Evans Lance, MD;  Location: [facility] CV LAB;  Service: Cardiovascular;  Laterality: N/A;  . PARTIAL COLECTOMY    . RIGHT/LEFT HEART CATH AND CORONARY ANGIOGRAPHY N/A 12/28/2016   Procedure: Right/Left Heart Cath and Coronary Angiography;  Surgeon: Belva Crome, MD;  Location: [facility] CV LAB;  Service: Cardiovascular;  Laterality: N/A;  . TEE WITHOUT CARDIOVERSION N/A 03/15/2017   Procedure: TRANSESOPHAGEAL ECHOCARDIOGRAM (TEE);  Surgeon: Burnell Blanks, MD;  Location: [facility];  Service: Open Heart Surgery;  Laterality: N/A;  . TONSILLECTOMY AND ADENOIDECTOMY  1942  . TOTAL KNEE ARTHROPLASTY Right 11/08/2017  . TOTAL KNEE ARTHROPLASTY Right 11/08/2017   Procedure: TOTAL KNEE ARTHROPLASTY;  Surgeon: Melrose Nakayama, MD;  Location: [facility];  Service: Orthopedics;  Laterality: Right;  . TRANSCATHETER AORTIC VALVE REPLACEMENT, TRANSFEMORAL N/A 03/15/2017   Procedure: TRANSCATHETER AORTIC VALVE REPLACEMENT, TRANSFEMORAL;  Surgeon: Burnell Blanks, MD;  Location: [facility];  Service: Open Heart Surgery;  Laterality: N/A;  . VEIN SURGERY  1980    Family History  Problem Relation Age of Onset  . Heart failure Father   . Emphysema Father   . COPD Father   . Early death Father   . Heart attack Father   . Hypertension Mother   . Early death Mother   . Hyperlipidemia Mother   . Stroke Mother   .  Cancer Sister   .  Hypertension Sister   . Healthy Sister     Social History:  reports that he has never smoked. He has never used smokeless tobacco. He reports that he does not drink alcohol and does not use drugs.  Review of Systems:     HYPERTENSION:   Currently not on any specific antihypertensives except diuretics Followed by cardiologist    BP Readings from Last 3 Encounters:  06/03/20 (!) 142/80  05/07/20 (!) 142/80  04/23/20 136/78   Renal dysfunction: Not associated with microalbuminuria  Creatinine history:  Lab Results  Component Value Date   CREATININE 1.90 (H) 05/30/2020   CREATININE 1.74 (H) 05/07/2020   CREATININE 1.63 (H) 01/30/2020    CARDIAC history:   No shortness of breath on exertion  He was given BiDil for CHF, followed regularly by cardiology  HYPERLIPIDEMIA: The lipid abnormality consists of elevated LDL and he is taking 80 mg Pravachol,  LDL is variable, but does not have known CAD Labs as follows:   Lab Results  Component Value Date   CHOL 170 05/30/2020   HDL 46.10 05/30/2020   LDLCALC 48 02/13/2019   LDLDIRECT 98.0 05/30/2020   TRIG 208.0 (H) 05/30/2020   CHOLHDL 4 05/30/2020     IRON deficiency anemia: Has not followed up with PCP about this  No history of B-12 deficiency  Hemoccult negative  CBC Latest Ref Rng & Units 01/30/2020 11/30/2019 10/26/2019  WBC 3.4 - 10.8 x10E3/uL 7.9 7.7 7.8  Hemoglobin 13.0 - 17.7 g/dL 11.8(L) 11.3(L) 10.4(L)  Hematocrit 37.5 - 51.0 % 35.1(L) 34.6(L) 31.4(L)  Platelets 150 - 450 x10E3/uL 143(L) 151 134(L)   Lab Results  Component Value Date   OCCULTBLD NEGATIVE 10/25/2019   OCCULTBLD Negative 06/26/2018   OCCULTBLD POSITIVE (A) 11/16/2017      ROS    Physical Exam    BP (!) 142/80 (BP Location: Left Arm, Patient Position: Sitting, Cuff Size: Normal)   Pulse 61   Ht 5\' 7"  (1.702 m)   Wt 231 lb 12.8 oz (105.1 kg)   SpO2 96%   BMI 36.31 kg/m       ASSESSMENT/ PLAN:   Diabetes type 2 with  obesity on insulin  See history of present illness for detailed discussion of his current management, blood sugar patterns and problems identified  His A1c is now 8.5 compared to 8.1  He appears to have postprandial hyperglycemia causing his high A1c This is not be monitored after breakfast and lunch at home Explained to him that his breakfast is causing his blood sugars to go up to over 200 and Amaryl is not of any use at this time Likely needs more insulin because of not being on Metformin Postprandial readings are not going to be controlled with Victoza alone Some of his high readings after dinner may be related to being already high before starting to eat from his lunch  Recommendations: Continue 24 units of NPH No change in Victoza 1.8 Stop Amaryl 8 units of NovoLog at breakfast and lunch but can skip the lunch dose if not eating any significant amount of carbohydrate such as with soups and salads Make sure he takes Humalog before starting to eat Start using the freestyle LIBRE for more complete monitoring, discussed in detail how this works and patient information given.  He will call the DME supplier and also given him coupon to get a free starter supply  He can use the stationary bike for exercise regularly  HYPERTENSION: Blood pressure followed by cardiologist  Appears well controlled now  LIPIDS: LDL is relatively higher, currently no diagnosis of CAD present and will not escalate his statin as yet   Renal function: Will forward results to cardiologist   There are no Patient Instructions on file for this visit.   [name] 06/03/2020, 11:05 AM

## 2020-06-03 NOTE — Patient Instructions (Addendum)
For any Carbs at lunch take 8 Humalog  8 Humalog at Breakfast DAILY  Humalog 12-16 at dinner based on meal size and carbs  Stop Glimeperide

## 2020-06-24 ENCOUNTER — Other Ambulatory Visit: Payer: Self-pay | Admitting: Internal Medicine

## 2020-06-24 ENCOUNTER — Other Ambulatory Visit: Payer: Self-pay | Admitting: Endocrinology

## 2020-06-30 ENCOUNTER — Other Ambulatory Visit: Payer: Self-pay

## 2020-06-30 MED ORDER — TAMSULOSIN HCL 0.4 MG PO CAPS: ORAL_CAPSULE | ORAL | 1 refills | Status: AC

## 2020-06-30 NOTE — Telephone Encounter (Signed)
Patient requests refill  tamsulosin (FLOMAX) 0.4 MG CAPS capsule sent to [facility]

## 2020-06-30 NOTE — Telephone Encounter (Signed)
rx sent

## 2020-07-03 ENCOUNTER — Other Ambulatory Visit: Payer: Self-pay | Admitting: Endocrinology

## 2020-07-16 ENCOUNTER — Ambulatory Visit (INDEPENDENT_AMBULATORY_CARE_PROVIDER_SITE_OTHER): Payer: Medicare Other | Admitting: Endocrinology

## 2020-07-16 ENCOUNTER — Other Ambulatory Visit: Payer: Self-pay

## 2020-07-16 VITALS — BP 156/98 | HR 76 | Ht 67.0 in | Wt 234.4 lb

## 2020-07-16 DIAGNOSIS — E1165 Type 2 diabetes mellitus with hyperglycemia: Secondary | ICD-10-CM

## 2020-07-16 DIAGNOSIS — Z794 Long term (current) use of insulin: Secondary | ICD-10-CM | POA: Diagnosis not present

## 2020-07-16 NOTE — Progress Notes (Signed)
Patient ID: Todd Romero, male   DOB: 04/11/1934, 86 y.o.   MRN: [id number]    Reason for Appointment:   Follow-up    History of Present Illness   Diagnosis: Type 2 DIABETES MELITUS, date of diagnosis:  1997   He has been on various regimens for his diabetes and has been on bedtime insulin with NPH since 2005 He also has benefited from adding Victoza in 2011 with better postprandial control and some weight loss Previously his weight has been as much as 247 pounds  On Victoza  since 11/14 with further improvement in blood sugar control. Since about 2015 his blood sugars have been overall mildly high with A1c over 7% consistently.  RECENT HISTORY:  His A1c is last higher than usual at 8.5   Non-insulin hypoglycemic drugs: Amaryl 2 mg am., Victoza 1.8 mg daily        Side effects from medications: None  Insulin regimen: NPH 26 at bedtime, 8-10 units at breakfast and lunch. Humalog 12-16 units with suppertime    Current management, blood sugar patterns and problems identified:  He was advised to start using the freestyle libre but he was trying to get this at the local drugstore and not call the DME supplier as directed  He was also advised to check blood sugars consistently after breakfast or lunch and he has not done so  However he has started taking Humalog at lunch and sometimes breakfast depending on what he is eating  With this his blood sugars are overall better both morning and after dinner  However he still not taking Humalog coverage when he is eating oatmeal or grits in the morning  Blood sugars are generally much better at night if he is eating a lower fat meal or small portions  They have been as high as 274 when eating high carbohydrate foods like pasta  He is doing a little outside activities but not much exercise  His weight is about the same  No hypoglycemia reported  He was also told to try to leave off his glimepiride but he thinks that  blood sugars started going up when he stopped this and is back on 2 mg in the morning  Monitors blood glucose:about 2x a day.    Glucometer:  Accu-Chek         Blood Glucose readings from meter download:    PRE-MEAL Fasting Lunch Dinner Bedtime Overall  Glucose range: 92-164      Mean/median: 120    144   POST-MEAL PC Breakfast PC Lunch PC Dinner  Glucose range:   115-274  Mean/median:   170   Prior:  PRE-MEAL Fasting Lunch Dinner Bedtime Overall  Glucose range:  83-195      Mean/median:  142    181   POST-MEAL PC Breakfast PC Lunch PC Dinner  Glucose range:    138-332  Mean/median:    228       Meal times: Dinner about 5-6 PM  Wt Readings from Last 3 Encounters:  07/16/20 234 lb 6.4 oz (106.3 kg)  06/03/20 231 lb 12.8 oz (105.1 kg)  05/07/20 232 lb (105.2 kg)   LABS: Lab Results  Component Value Date   HGBA1C 8.5 (H) 05/30/2020   HGBA1C 8.1 (H) 12/05/2019   HGBA1C 7.4 (H) 09/03/2019   Lab Results  Component Value Date   MICROALBUR 0.7 02/13/2019   LDLCALC 48 02/13/2019   CREATININE 1.90 (H) 05/30/2020  OTHER active problems are discussed in review of systems    Allergies as of 07/16/2020      Reactions   Bactrim [sulfamethoxazole-trimethoprim] Nausea And Vomiting, Other (See Comments)   Bleeding and ulcers   Morphine And Related Nausea And Vomiting      Medication List       Accurate as of July 16, 2020  2:18 PM. If you have any questions, ask your nurse or doctor.        Accu-Chek Aviva Plus test strip Generic drug: glucose blood USE AS INSTRUCTED TO CHECK BLOOD SUGAR TWICE A DAY DX:E11.65   acetaminophen 500 MG tablet Commonly known as: TYLENOL Take 500 mg by mouth every 4 (four) hours as needed for moderate pain or headache.   amiodarone 200 MG tablet Commonly known as: PACERONE TAKE 1/2 TABLET BY MOUTH DAILY   B-D UF III MINI PEN NEEDLES 31G X 5 MM Misc Generic drug: Insulin Pen Needle USE 2 PEN NEEDLE PER DAY WITH HUMALOG  AND VICTOZA   clonazePAM 0.5 MG tablet Commonly known as: KLONOPIN Take 1-2 tablets (0.5-1 mg total) by mouth at bedtime.   Eliquis 2.5 MG Tabs tablet Generic drug: apixaban TAKE 1 TABLET TWICE A DAY (DISCONTINUE 5 MG TABLET)   ferrous sulfate 325 (65 FE) MG tablet Take 325 mg by mouth 2 (two) times daily with a meal.   furosemide 40 MG tablet Commonly known as: LASIX Take 1 tablet (40 mg total) by mouth 2 (two) times daily.   glimepiride 2 MG tablet Commonly known as: AMARYL TAKE 1 TABLET DAILY BEFORE BREAKFAST   insulin lispro 100 UNIT/ML KwikPen Commonly known as: HumaLOG KwikPen Inject 12 Units into the skin 2 (two) times daily before lunch and supper. Inject 12 units under skin before lunch and dinner. May also inject 4 unit at breakfast is sugar reading is over 100. What changed: additional instructions   insulin NPH Human 100 UNIT/ML injection Commonly known as: HumuLIN N Inject 26 units of Humulin N under the skin once daily at bedtime.   Insulin Syringe-Needle U-100 31G X 5/16" 1 ML Misc Commonly known as: BD Insulin Syringe U/F Use to inject insulin daily. DX:E11.9   metolazone 5 MG tablet Commonly known as: ZAROXOLYN TAKEN ONE TABLET BY MOUTH ON MONDAY AND THURSDAY. TAKE 30 MINUTES PRIOR TO FUROSEMIDE.   multivitamin with minerals Tabs tablet Take 1 tablet by mouth daily. Mens One a day Vit   pantoprazole 40 MG tablet Commonly known as: PROTONIX Take 1 tablet (40 mg total) by mouth 2 (two) times daily.   pravastatin 80 MG tablet Commonly known as: PRAVACHOL Take 1 tablet (80 mg total) by mouth every evening.   PRESERVISION AREDS 2 PO Take 1 capsule by mouth 2 (two) times daily.   spironolactone 25 MG tablet Commonly known as: ALDACTONE Take 0.5 tablets (12.5 mg total) by mouth every Monday, Wednesday, and Friday.   tamsulosin 0.4 MG Caps capsule Commonly known as: FLOMAX Take 1 capsule daily   Victoza 18 MG/3ML Sopn Generic drug:  liraglutide INJECT 1.8 MG UNDER THE SKIN AT BEDTIME   vitamin C 500 MG tablet Commonly known as: ASCORBIC ACID Take 500 mg by mouth daily.   Vitamin D 50 MCG (2000 UT) tablet Take 2,000 Units by mouth daily.       Allergies:  Allergies  Allergen Reactions  . Bactrim [Sulfamethoxazole-Trimethoprim] Nausea And Vomiting and Other (See Comments)    Bleeding and ulcers  . Morphine And Related Nausea  And Vomiting    Past Medical History:  Diagnosis Date  . AKI (acute kidney injury) ([place]) 10/24/2019  . Anemia   . Arthritis   . CAD (coronary artery disease)    a. cardiac cath 12/2016 showing moderate AS, elevated LVEDP, heavy 3V coronary calcification, 100% mD2, 30-50% LAD, 50-90% stenosis of Cx proximal to origin of L-PDA, RCA not engaged due to poor catheter control (difficult procedure) - coronary status was essentially unchanged from prior.  . Chicken pox   . Chronic diastolic CHF (congestive heart failure) ([place])   . Colon polyps   . Degenerative arthritis   . Diabetes ([place])   . Dyspnea   . Dyspnea on exertion 02/11/2015  . Essential hypertension   . Heart disease   . Heme positive stool 11/16/2017  . Hyperlipidemia   . New onset a-fib ([place]) 03/04/2018  . Obesity (BMI 30-39.9) 06/18/2015  . OSA (obstructive sleep apnea)    Severe with AHI 27/hr now on CPAP  not compliant with treatment.  . Peritonitis ([place]) 1985?  [name] Pneumonia    "6 months - 86 years old"  . Pure hypercholesterolemia   . RBBB   . [facility] spotted fever   . 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 Severe aortic stenosis    a. s/p TAVR 03/2017.  . Type II or unspecified type diabetes mellitus without mention of complication, not stated as uncontrolled   . Varicose vein of leg     Past Surgical History:  Procedure Laterality Date  . APPENDECTOMY  1985   peritonitis  .  CARDIOVERSION N/A 04/03/2018   Procedure: CARDIOVERSION;  Surgeon: Josue Hector, MD;  Location: [facility] ENDOSCOPY;  Service: Cardiovascular;  Laterality: N/A;  . CARDIOVERSION N/A 01/18/2019   Procedure: CARDIOVERSION;  Surgeon: Skeet Latch, MD;  Location: [place];  Service: Cardiovascular;  Laterality: N/A;  . CARPAL TUNNEL RELEASE    . COLONOSCOPY WITH PROPOFOL N/A 11/18/2017   Procedure: COLONOSCOPY WITH PROPOFOL;  Surgeon: Carol Ada, MD;  Location: [facility] ENDOSCOPY;  Service: Endoscopy;  Laterality: N/A;  . ESOPHAGOGASTRODUODENOSCOPY N/A 11/18/2017   Procedure: ESOPHAGOGASTRODUODENOSCOPY (EGD);  Surgeon: Carol Ada, MD;  Location: [facility] END[facility];  Service: Endoscopy;  Laterality: N/A;  . EYE SURGERY Bilateral    cataracts removed, lens placed  . JOINT REPLACEMENT    . KNEE CARTILAGE SURGERY    . PACEMAKER IMPLANT N/A 10/25/2019   Procedure: PACEMAKER IMPLANT;  Surgeon: Evans Lance, MD;  Location: [place] CV LAB;  Service: Cardiovascular;  Laterality: N/A;  . PARTIAL COLECTOMY    . RIGHT/LEFT HEART CATH AND CORONARY ANGIOGRAPHY N/A 12/28/2016   Procedure: Right/Left Heart Cath and Coronary Angiography;  Surgeon: Belva Crome, MD;  Location: [place] CV LAB;  Service: Cardiovascular;  Laterality: N/A;  . TEE WITHOUT CARDIOVERSION N/A 03/15/2017   Procedure: TRANSESOPHAGEAL ECHOCARDIOGRAM (TEE);  Surgeon: Burnell Blanks, MD;  Location: [place];  Service: Open Heart Surgery;  Laterality: N/A;  . TONSILLECTOMY AND ADENOIDECTOMY  1942  . TOTAL KNEE ARTHROPLASTY Right 11/08/2017  . TOTAL KNEE ARTHROPLASTY Right 11/08/2017   Procedure: TOTAL KNEE ARTHROPLASTY;  Surgeon: Melrose Nakayama, MD;  Location: [place];  Service: Orthopedics;  Laterality: Right;  . TRANSCATHETER AORTIC VALVE REPLACEMENT, TRANSFEMORAL N/A 03/15/2017   Procedure: TRANSCATHETER AORTIC VALVE REPLACEMENT, TRANSFEMORAL;  Surgeon: Burnell Blanks, MD;  Location: [place];  Service: Open Heart Surgery;  Laterality: N/A;  . VEIN SURGERY  1980    Family History  Problem Relation Age of Onset  . Heart failure Father   . Emphysema Father   . COPD Father   . Early death Father   . Heart attack Father   . Hypertension Mother   . Early death Mother   . Hyperlipidemia Mother   . Stroke Mother   . Cancer Sister   . Hypertension Sister   . Healthy Sister     Social History:  reports that he has never smoked. He has never used smokeless tobacco. He reports that he does not drink alcohol and does not use drugs.  Review of Systems:     HYPERTENSION:   Currently not on any specific antihypertensives except diuretics Followed by cardiologist    BP Readings from Last 3 Encounters:  07/16/20 (!) 156/98  06/03/20 (!) 142/80  05/07/20 (!) 142/80   Renal dysfunction: Not associated with microalbuminuria  Creatinine history:  Lab Results  Component Value Date   CREATININE 1.90 (H) 05/30/2020   CREATININE 1.74 (H) 05/07/2020   CREATININE 1.63 (H) 01/30/2020    CARDIAC history:   No shortness of breath on exertion  Taking BiDil for CHF, followed regularly by cardiology  HYPERLIPIDEMIA: The lipid abnormality consists of elevated LDL and he is taking 80 mg Pravachol,  LDL is variable, but does not have known CAD Labs as follows:   Lab Results  Component Value Date   CHOL 170 05/30/2020   HDL 46.10 05/30/2020   LDLCALC 48 02/13/2019   LDLDIRECT 98.0 05/30/2020   TRIG 208.0 (H) 05/30/2020   CHOLHDL 4 05/30/2020     History of anemia:  CBC Latest Ref Rng & Units 01/30/2020 11/30/2019 10/26/2019  WBC 3.4 - 10.8 x10E3/uL 7.9 7.7 7.8  Hemoglobin 13.0 - 17.7 g/dL 11.8(L) 11.3(L) 10.4(L)  Hematocrit 37.5 - 51.0 % 35.1(L) 34.6(L) 31.4(L)  Platelets 150 - 450 x10E3/uL 143(L) 151 134(L)      ROS    Physical Exam    BP (!) 156/98   Pulse 76   Ht 5\' 7"  (1.702 m)   Wt 234 lb 6.4 oz (106.3 kg)   SpO2 98%   BMI 36.71 kg/m       ASSESSMENT/ PLAN:   Diabetes type 2  with obesity on insulin  See history of present illness for detailed discussion of his current management, blood sugar patterns and problems identified  His A1c is last 8.5 compared to 8.1  As discussed above his blood sugars after dinner are quite variable but not clear if they are already high before eating from his lunch meal Likely needs more consistent coverage for all meals but needs better assessment of postprandial readings after breakfast and lunch  Recommendations: Continue 24 units of NPH Discussed the need to check blood sugars after breakfast and lunch to help adjust the Humalog dose time Likely needs to take some Humalog in the morning for all meals since he usually has some carbohydrate Also needs to go up on the Humalog to even 18 units at suppertime for larger or higher carbohydrate meals He will call the DME supplier for getting the freestyle libre started, contact information given   HYPERTENSION: Blood pressure followed by cardiologist and is due for follow-up soon Also needs to check at home        Patient Instructions  Check blood sugars on waking up days a week  Also check blood sugars about 2 hours after meals  and do this after different meals by rotation  Recommended blood sugar levels on waking up are 90-130 and about 2 hours after meal is 130-160  Please bring your blood sugar monitor to each visit, thank you      [name] 07/16/2020, 2:18 PM

## 2020-07-16 NOTE — Patient Instructions (Signed)
Check blood sugars on waking up days a week  Also check blood sugars about 2 hours after meals and do this after different meals by rotation  Recommended blood sugar levels on waking up are 90-130 and about 2 hours after meal is 130-160  Please bring your blood sugar monitor to each visit, thank you   

## 2020-07-25 ENCOUNTER — Ambulatory Visit (INDEPENDENT_AMBULATORY_CARE_PROVIDER_SITE_OTHER): Payer: Medicare Other

## 2020-07-25 DIAGNOSIS — I495 Sick sinus syndrome: Secondary | ICD-10-CM | POA: Diagnosis not present

## 2020-07-25 LAB — CUP PACEART REMOTE DEVICE CHECK
Battery Remaining Longevity: 159 mo
Battery Voltage: 3.13 V
Brady Statistic AP VP Percent: 15.31 %
Brady Statistic AP VS Percent: 36.22 %
Brady Statistic AS VP Percent: 6.28 %
Brady Statistic AS VS Percent: 42.18 %
Brady Statistic RA Percent Paced: 51.06 %
Brady Statistic RV Percent Paced: 21.6 %
Date Time Interrogation Session: 20211118211409
Implantable Lead Implant Date: 20210218
Implantable Lead Implant Date: 20210218
Implantable Lead Location: 753859
Implantable Lead Location: 753860
Implantable Lead Model: 3830
Implantable Lead Model: 5076
Implantable Pulse Generator Implant Date: 20210218
Lead Channel Impedance Value: 323 Ohm
Lead Channel Impedance Value: 342 Ohm
Lead Channel Impedance Value: 456 Ohm
Lead Channel Impedance Value: 475 Ohm
Lead Channel Pacing Threshold Amplitude: 0.625 V
Lead Channel Pacing Threshold Amplitude: 0.625 V
Lead Channel Pacing Threshold Pulse Width: 0.4 ms
Lead Channel Pacing Threshold Pulse Width: 0.4 ms
Lead Channel Sensing Intrinsic Amplitude: 1.625 mV
Lead Channel Sensing Intrinsic Amplitude: 1.625 mV
Lead Channel Sensing Intrinsic Amplitude: 8 mV
Lead Channel Sensing Intrinsic Amplitude: 8 mV
Lead Channel Setting Pacing Amplitude: 1.5 V
Lead Channel Setting Pacing Amplitude: 2.5 V
Lead Channel Setting Pacing Pulse Width: 0.4 ms
Lead Channel Setting Sensing Sensitivity: 1.2 mV

## 2020-07-28 NOTE — Progress Notes (Signed)
Remote pacemaker transmission.   

## 2020-08-11 NOTE — Progress Notes (Signed)
Cardiology Office Note:    Date:  08/12/2020   ID:  [name], DOB 1934-05-15, MRN [id number]  PCP:  Ma Hillock, DO  Cardiologist:  Sinclair Grooms, MD   Referring MD: Ma Hillock, DO   Chief Complaint  Patient presents with  . Congestive Heart Failure    History of Present Illness:    Todd Romero is a 86 y.o. male with a hx of severe AS 03/2017 treated with TAVR,, chronic diastolic heart failure, CAD with CTO of D1 and 90% LCX (cath 2013),OSA not on therapy, HTN, type 2 diabetes, hyperlipidemia, PAF,electrical cardioversion on Amiodarone-> NSR and resolution of A/C/CHF,subsequent significant bradycardia and AV conduction abnormality on amiodarone leading to permanent pacemaker therapy after near syncopal episode in February 2021.  Intermittent orthopnea and PND.  Recent lower extremity swelling greater than usual.  Does not like taking extra doses of Lasix because of the increased urine output.  Most recent weight was 223 pounds.  Optimal weight is [phone] pound range.  I cautioned him that weighing is one of our most useful tools to determine his volume status.   He has not had racing or pounding heart rate.  He is having no specific side effects from his medication other than frequent urination related to diuretic therapy.  If he had his choice he would not use any diuretic.    Past Medical History:  Diagnosis Date  . AKI (acute kidney injury) ([place]) 10/24/2019  . Anemia   . Arthritis   . CAD (coronary artery disease)    a. cardiac cath 12/2016 showing moderate AS, elevated LVEDP, heavy 3V coronary calcification, 100% mD2, 30-50% LAD, 50-90% stenosis of Cx proximal to origin of L-PDA, RCA not engaged due to poor catheter control (difficult procedure) - coronary status was essentially unchanged from prior.  . Chicken pox   . Chronic diastolic CHF (congestive heart failure) ([place])   . Colon polyps   . Degenerative arthritis   . Diabetes ([place])   .  Dyspnea   . Dyspnea on exertion 02/11/2015  . Essential hypertension   . Heart disease   . Heme positive stool 11/16/2017  . Hyperlipidemia   . New onset a-fib ([place]) 03/04/2018  . Obesity (BMI 30-39.9) 06/18/2015  . OSA (obstructive sleep apnea)    Severe with AHI 27/hr now on CPAP  not compliant with treatment.  . Peritonitis ([place]) 1985?  [name] Pneumonia    "6 months - 86 years old"  . Pure hypercholesterolemia   . RBBB   . [place] spotted fever   . 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 Severe aortic stenosis    a. s/p TAVR 03/2017.  . Type II or unspecified type diabetes mellitus without mention of complication, not stated as uncontrolled   . Varicose vein of leg     Past Surgical History:  Procedure Laterality Date  . APPENDECTOMY  1985   peritonitis  . CARDIOVERSION N/A 04/03/2018   Procedure: CARDIOVERSION;  Surgeon: Josue Hector, MD;  Location[facility] ENDOSCOPY;  Service: Cardiovascular;  Laterality: N/A;  . CARDIOVERSION N/A 01/18/2019   Procedure: CARDIOVERSION;  Surgeon: Skeet Latch, MD;  Location: [place];  Service: Cardiovascular;  Laterality: N/A;  . CARPAL TUNNEL RELEASE    . COLONOSCOPY WITH PROPOFOL N/A 11/18/2017   Procedure: COLONOSCOPY WITH PROPOFOL;  Surgeon: Carol Ada, MD;  Location:  [facility] ENDOSCOPY;  Service: Endoscopy;  Laterality: N/A;  . ESOPHAGOGASTRODUODENOSCOPY N/A 11/18/2017   Procedure: ESOPHAGOGASTRODUODENOSCOPY (EGD);  Surgeon: Carol Ada, MD;  Location: [facility];  Service: Endoscopy;  Laterality: N/A;  . EYE SURGERY Bilateral    cataracts removed, lens placed  . JOINT REPLACEMENT    . KNEE CARTILAGE SURGERY    . PACEMAKER IMPLANT N/A 10/25/2019   Procedure: PACEMAKER IMPLANT;  Surgeon: Evans Lance, MD;  Location: [place] CV LAB;  Service: Cardiovascular;  Laterality: N/A;  . PARTIAL COLECTOMY    .  RIGHT/LEFT HEART CATH AND CORONARY ANGIOGRAPHY N/A 12/28/2016   Procedure: Right/Left Heart Cath and Coronary Angiography;  Surgeon: Belva Crome, MD;  Location: [place] CV LAB;  Service: Cardiovascular;  Laterality: N/A;  . TEE WITHOUT CARDIOVERSION N/A 03/15/2017   Procedure: TRANSESOPHAGEAL ECHOCARDIOGRAM (TEE);  Surgeon: Burnell Blanks, MD;  Location: [place];  Service: Open Heart Surgery;  Laterality: N/A;  . TONSILLECTOMY AND ADENOIDECTOMY  1942  . TOTAL KNEE ARTHROPLASTY Right 11/08/2017  . TOTAL KNEE ARTHROPLASTY Right 11/08/2017   Procedure: TOTAL KNEE ARTHROPLASTY;  Surgeon: Melrose Nakayama, MD;  Location: [place];  Service: Orthopedics;  Laterality: Right;  . TRANSCATHETER AORTIC VALVE REPLACEMENT, TRANSFEMORAL N/A 03/15/2017   Procedure: TRANSCATHETER AORTIC VALVE REPLACEMENT, TRANSFEMORAL;  Surgeon: Burnell Blanks, MD;  Location: [place];  Service: Open Heart Surgery;  Laterality: N/A;  . VEIN SURGERY  1980    Current Medications: Current Meds  Medication Sig  . ACCU-CHEK AVIVA PLUS test strip USE AS INSTRUCTED TO CHECK BLOOD SUGAR TWICE A DAY DX:E11.65  . acetaminophen (TYLENOL) 500 MG tablet Take 500 mg by mouth every 4 (four) hours as needed for moderate pain or headache.  [name] amiodarone (PACERONE) 200 MG tablet TAKE 1/2 TABLET BY MOUTH DAILY  . B-D UF III MINI PEN NEEDLES 31G X 5 MM MISC USE 2 PEN NEEDLE PER DAY WITH HUMALOG AND VICTOZA  . Cholecalciferol (VITAMIN D) 50 MCG (2000 UT) tablet Take 2,000 Units by mouth daily.  . clonazePAM (KLONOPIN) 0.5 MG tablet Take 1-2 tablets (0.5-1 mg total) by mouth at bedtime.  [name] ELIQUIS 2.5 MG TABS tablet TAKE 1 TABLET TWICE A DAY (DISCONTINUE 5 MG TABLET)  . ferrous sulfate 325 (65 FE) MG tablet Take 325 mg by mouth 2 (two) times daily with a meal.   . furosemide (LASIX) 40 MG tablet Take 1 tablet (40 mg total) by mouth 2 (two) times daily.  [name] glimepiride (AMARYL) 2 MG tablet TAKE 1 TABLET DAILY BEFORE BREAKFAST  . insulin  lispro (HUMALOG KWIKPEN) 100 UNIT/ML KwikPen Inject 12 Units into the skin 2 (two) times daily before lunch and supper. Inject 12 units under skin before lunch and dinner. May also inject 4 unit at breakfast is sugar reading is over 100.  . insulin NPH Human (HUMULIN N) 100 UNIT/ML injection Inject 26 units of Humulin N under the skin once daily at bedtime.  . Insulin Syringe-Needle U-100 (BD INSULIN SYRINGE U/F) 31G X 5/16" 1 ML MISC Use to inject insulin daily. DX:E11.9  . metolazone (ZAROXOLYN) 5 MG tablet TAKEN ONE TABLET BY MOUTH ON MONDAY AND THURSDAY. TAKE 30 MINUTES PRIOR TO FUROSEMIDE.  . Multiple Vitamin (MULTIVITAMIN WITH MINERALS) TABS tablet Take 1 tablet by mouth daily. Mens One a day Vit  . Multiple Vitamins-Minerals (PRESERVISION AREDS 2 PO) Take 1 capsule by mouth 2 (two) times daily.  . pantoprazole (PROTONIX) 40 MG tablet Take 1 tablet (40 mg total) by mouth  2 (two) times daily.  . pravastatin (PRAVACHOL) 80 MG tablet Take 1 tablet (80 mg total) by mouth every evening.  [name] spironolactone (ALDACTONE) 25 MG tablet Take 0.5 tablets (12.5 mg total) by mouth every Monday, Wednesday, and Friday.  . tamsulosin (FLOMAX) 0.4 MG CAPS capsule Take 1 capsule daily  . VICTOZA 18 MG/3ML SOPN INJECT 1.8 MG UNDER THE SKIN AT BEDTIME  . vitamin C (ASCORBIC ACID) 500 MG tablet Take 500 mg by mouth daily.     Allergies:   Bactrim [sulfamethoxazole-trimethoprim] and Morphine and related   Social History   Socioeconomic History  . Marital status: Married    Spouse name: Not on file  . Number of children: 4  . Years of education: Not on file  . Highest education level: Not on file  Occupational History  . Occupation: Construction-Retired  Tobacco Use  . Smoking status: Never Smoker  . Smokeless tobacco: Never Used  Vaping Use  . Vaping Use: Never used  Substance and Sexual Activity  . Alcohol use: No  . Drug use: No  . Sexual activity: Not Currently    Partners: Female  Other Topics  Concern  . 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 Difficulty of Paying Living Expenses: Not on file  Food Insecurity:   . Worried About Charity fundraiser in the Last Year: Not on file  . Ran Out of Food in the Last Year: Not on file  Transportation Needs:   . Lack of Transportation (Medical): Not on file  . Lack of Transportation (Non-Medical): Not on file  Physical Activity:   . Days of Exercise per Week: Not on file  . Minutes of Exercise per Session: Not on file  Stress:   . Feeling of Stress : Not on file  Social Connections:   . Frequency of Communication with Friends and Family: Not on file  . Frequency of Social Gatherings with Friends and Family: Not on file  . Attends Religious Services: Not on file  . Active Member of Clubs or Organizations: Not on file  . Attends Archivist Meetings: Not on file  . Marital Status: Not on file     Family History: The patient's family history includes COPD in his father; Cancer in his sister; Early death in his father and mother; Emphysema in his father; Healthy in his sister; Heart attack in his father; Heart failure in his father; Hyperlipidemia in his mother; Hypertension in his mother and sister; Stroke in his mother.  ROS:   Please see the history of present illness.    Difficulty with balance.  Progressive weakness.  Not ambulating as much as he once did.  He and wife hope that he can live the least until December 2022 because his son will get out of prison at that time.  All other systems reviewed and are negative.  EKGs/Labs/Other Studies Reviewed:    The following studies were reviewed today: No new data  EKG:  EKG not  repeated  Recent Labs: 10/24/2019: B Natriuretic Peptide 125.0; Magnesium 2.1 01/30/2020: ALT 15; Hemoglobin 11.8; NT-Pro BNP 1,776; Platelets 143; TSH 0.718 05/30/2020: BUN 60;  Creatinine, Ser 1.90; Potassium 3.7; Sodium 135  Recent Lipid Panel    Component Value Date/Time   CHOL 170 05/30/2020 0933   TRIG 208.0 (H) 05/30/2020 0933   HDL 46.10 05/30/2020 0933   CHOLHDL 4 05/30/2020 0933   VLDL 41.6 (H) 05/30/2020 0933   LDLCALC 48 02/13/2019 1005   LDLDIRECT 98.0 05/30/2020 0933    Physical Exam:    VS:  BP 140/80   Pulse 74   Ht 5\' 7"  (1.702 m)   Wt 232 lb (105.2 kg)   SpO2 99%   BMI 36.34 kg/m     Wt Readings from Last 3 Encounters:  08/12/20 232 lb (105.2 kg)  07/16/20 234 lb 6.4 oz (106.3 kg)  06/03/20 231 lb 12.8 oz (105.1 kg)     GEN: Elderly and obese. No acute distress HEENT: Normal NECK: No JVD. LYMPHATICS: No lymphadenopathy CARDIAC: Irregular but predominantly RRR without murmur, gallop, or edema. VASCULAR:  Normal Pulses. No bruits. RESPIRATORY:  Clear to auscultation without rales, wheezing or rhonchi  ABDOMEN: Soft, non-tender, non-distended, No pulsatile mass, MUSCULOSKELETAL: No deformity  SKIN: Warm and dry NEUROLOGIC:  Alert and oriented x 3 PSYCHIATRIC:  Normal affect   ASSESSMENT:    1. Chronic combined systolic and diastolic congestive heart failure ([place])   2. S/P TAVR (transcatheter aortic valve replacement)   3. Tachycardia-bradycardia syndrome ([place])   4. On amiodarone therapy   5. CKD (chronic kidney disease) stage 4, GFR 15-29 ml/min (H[facility])   6. Obstructive sleep apnea   7. Chronic anticoagulation   8. Educated about COVID-19 virus infection    PLAN:    In order of problems listed above:  1. I would love to consider dapagliflozin in this patient.  His diabetes regimen is complicated.  This would need the guidance of Dr. [name] his endocrinologist.  This may help decrease the intensity of furosemide.  However we have set a sliding  scale for furosemide using the 218 to 220 pound range as his dry weight.  I have encouraged him to weigh daily under the same circumstances after his first morning void.  Greater than 2 to 3 pound increase in weight in 1 day, use furosemide 80 mg a.m. and 40 mg p.m. until weight decreases below 220 pounds.  At that point he will go back to furosemide 40 mg twice daily.  Continue metolazone and Aldactone. 2. TAVR valve function appears stable without regurgitation. 3. Pacemaker is followed in clinic 4. Amiodarone therapy, 100 mg/day to maintain sinus rhythm which clinically is present today. 5. CKD stage IV with creatinine of 1.9 mg/dL 01/30/2020; 1.9 mg/dL 05/30/2020.  Guideline directed therapy for left ventricular systolic dysfunction: Angiotensin receptor-neprilysin inhibitor (ARNI)-Entresto; beta-blocker therapy - carvedilol, metoprolol succinate, or bisoprolol; mineralocorticoid receptor antagonist (MRA) therapy -spironolactone or eplerenone.  SGLT-2 agents -  Dapagliflozin Wilder [place]) or Empagliflozin ([place]).These therapies have been shown to improve clinical outcomes including reduction of rehospitalization, survival, and acute heart failure.     Medication Adjustments/Labs and Tests Ordered: Current medicines are reviewed at length with the patient today.  Concerns regarding medicines are outlined above.  Orders Placed This Encounter  Procedures  . Basic metabolic panel  . Hepatic function panel  . TSH   Meds ordered this encounter  Medications  . furosemide (LASIX) 40 MG tablet    Sig: Take one tablet by mouth daily as needed for weight gain or swelling    Dispense:  90 tablet    Refill:  3  Patient Instructions  Medication Instructions:  1) INCREASE Furosemide to 80mg  in the morning and 40mg  in the evening for up to 3 days to try to get your weight 220lbs or less.  Your goal weight is to be between 218-220lbs.  Go back to 40mg  twice daily once goal is reached.  You can take  an additional 40mg  in the morning as needed with weight gain.   *If you need a refill on your cardiac medications before your next appointment, please call your pharmacy*   Lab Work: BMET, Liver and TSH today  If you have labs (blood work) drawn today and your tests are completely normal, you will receive your results only by: [name] MyChart Message (if you have MyChart) OR . A paper copy in the mail If you have any lab test that is abnormal or we need to change your treatment, we will call you to review the results.   Testing/Procedures: None   Follow-Up: At [facility], you and your health needs are our priority.  As part of our continuing mission to provide you with exceptional heart care, we have created designated Provider Care Teams.  These Care Teams include your primary Cardiologist (physician) and Advanced Practice Providers (APPs -  Physician Assistants and Nurse Practitioners) who all work together to provide you with the care you need, when you need it.  We recommend signing up for the patient portal called "MyChart".  Sign up information is provided on this After Visit Summary.  MyChart is used to connect with patients for Virtual Visits (Telemedicine).  Patients are able to view lab/test results, encounter notes, upcoming appointments, etc.  Non-urgent messages can be sent to your provider as well.   To learn more about what you can do with MyChart, go to [email or web address].    Your next appointment:   3-4 month(s)  The format for your next appointment:   In Person  Provider:   You may see Sinclair Grooms, MD or one of the following Advanced Practice Providers on your designated Care Team:    Truitt Merle, NP  Cecilie Kicks, NP  Kathyrn Drown, NP    Other Instructions      Signed, Sinclair Grooms, MD  08/12/2020 5:20 PM    [place]

## 2020-08-12 ENCOUNTER — Encounter: Payer: Self-pay | Admitting: Interventional Cardiology

## 2020-08-12 ENCOUNTER — Ambulatory Visit (INDEPENDENT_AMBULATORY_CARE_PROVIDER_SITE_OTHER): Payer: Medicare Other | Admitting: Interventional Cardiology

## 2020-08-12 ENCOUNTER — Other Ambulatory Visit: Payer: Self-pay

## 2020-08-12 VITALS — BP 140/80 | HR 74 | Ht 67.0 in | Wt 232.0 lb

## 2020-08-12 DIAGNOSIS — Z79899 Other long term (current) drug therapy: Secondary | ICD-10-CM | POA: Diagnosis not present

## 2020-08-12 DIAGNOSIS — Z7901 Long term (current) use of anticoagulants: Secondary | ICD-10-CM

## 2020-08-12 DIAGNOSIS — I5042 Chronic combined systolic (congestive) and diastolic (congestive) heart failure: Secondary | ICD-10-CM | POA: Diagnosis not present

## 2020-08-12 DIAGNOSIS — I495 Sick sinus syndrome: Secondary | ICD-10-CM | POA: Diagnosis not present

## 2020-08-12 DIAGNOSIS — N184 Chronic kidney disease, stage 4 (severe): Secondary | ICD-10-CM

## 2020-08-12 DIAGNOSIS — Z7189 Other specified counseling: Secondary | ICD-10-CM

## 2020-08-12 DIAGNOSIS — G4733 Obstructive sleep apnea (adult) (pediatric): Secondary | ICD-10-CM

## 2020-08-12 DIAGNOSIS — Z952 Presence of prosthetic heart valve: Secondary | ICD-10-CM | POA: Diagnosis not present

## 2020-08-12 MED ORDER — FUROSEMIDE 40 MG PO TABS: ORAL_TABLET | ORAL | 3 refills | Status: DC

## 2020-08-12 NOTE — Patient Instructions (Signed)
Medication Instructions:  1) INCREASE Furosemide to 80mg  in the morning and 40mg  in the evening for up to 3 days to try to get your weight 220lbs or less.  Your goal weight is to be between 218-220lbs.  Go back to 40mg  twice daily once goal is reached.  You can take an additional 40mg  in the morning as needed with weight gain.   *If you need a refill on your cardiac medications before your next appointment, please call your pharmacy*   Lab Work: BMET, Liver and TSH today  If you have labs (blood work) drawn today and your tests are completely normal, you will receive your results only by: [name] MyChart Message (if you have MyChart) OR . A paper copy in the mail If you have any lab test that is abnormal or we need to change your treatment, we will call you to review the results.   Testing/Procedures: None   Follow-Up: At [facility], you and your health needs are our priority.  As part of our continuing mission to provide you with exceptional heart care, we have created designated Provider Care Teams.  These Care Teams include your primary Cardiologist (physician) and Advanced Practice Providers (APPs -  Physician Assistants and Nurse Practitioners) who all work together to provide you with the care you need, when you need it.  We recommend signing up for the patient portal called "MyChart".  Sign up information is provided on this After Visit Summary.  MyChart is used to connect with patients for Virtual Visits (Telemedicine).  Patients are able to view lab/test results, encounter notes, upcoming appointments, etc.  Non-urgent messages can be sent to your provider as well.   To learn more about what you can do with MyChart, go to [email or web address].    Your next appointment:   3-4 month(s)  The format for your next appointment:   In Person  Provider:   You may see Sinclair Grooms, MD or one of the following Advanced Practice Providers on your designated Care Team:    Truitt Merle, NP  Cecilie Kicks, NP  Kathyrn Drown, NP    Other Instructions

## 2020-08-13 LAB — BASIC METABOLIC PANEL
BUN/Creatinine Ratio: 28 — ABNORMAL HIGH (ref 10–24)
BUN: 60 mg/dL — ABNORMAL HIGH (ref 8–27)
CO2: 22 mmol/L (ref 20–29)
Calcium: 9.1 mg/dL (ref 8.6–10.2)
Chloride: 97 mmol/L (ref 96–106)
Creatinine, Ser: 2.14 mg/dL — ABNORMAL HIGH (ref 0.76–1.27)
GFR calc Af Amer: 32 mL/min/{1.73_m2} — ABNORMAL LOW (ref >59)
GFR calc non Af Amer: 27 mL/min/{1.73_m2} — ABNORMAL LOW (ref >59)
Glucose: 153 mg/dL — ABNORMAL HIGH (ref 65–99)
Potassium: 3.5 mmol/L (ref 3.5–5.2)
Sodium: 135 mmol/L (ref 134–144)

## 2020-08-13 LAB — HEPATIC FUNCTION PANEL
ALT: 17 IU/L (ref 0–44)
AST: 24 IU/L (ref 0–40)
Albumin: 4.2 g/dL (ref 3.6–4.6)
Alkaline Phosphatase: 75 IU/L (ref 44–121)
Bilirubin Total: 0.5 mg/dL (ref 0.0–1.2)
Bilirubin, Direct: 0.21 mg/dL (ref 0.00–0.40)
Total Protein: 6.4 g/dL (ref 6.0–8.5)

## 2020-08-13 LAB — TSH: TSH: 0.757 u[IU]/mL (ref 0.450–4.500)

## 2020-08-19 ENCOUNTER — Telehealth: Payer: Self-pay | Admitting: Interventional Cardiology

## 2020-08-19 MED ORDER — FUROSEMIDE 40 MG PO TABS: ORAL_TABLET | ORAL | 2 refills | Status: DC

## 2020-08-19 NOTE — Telephone Encounter (Signed)
Wife states pt's swelling is still quite significant.  Still in calves and feet.  No improvement with elevation.  Weight was 224lbs for 3 days and has gone up to 225lbs today.  Pt's breathing is fine.  Seen on 12/7 and told to take Furosemide 80AM/40PM x 3 days.  Has been doing 40mg  BID since then.  Creatinine on 12/7 was 2.14.  Inquired about salt intake and pt and wife stated he had not had extra salt.  Inquired about fluid intake and wife states pt drinks mostly coffee and diet soda and some water.  Advised soda, including diet, has a lot of sodium in it.  Advised pt needs to cut back or eliminate it.  Did advise to go ahead and take 80mg  of Furosemide this morning and I will send message to Dr. Tamala Julian for review.

## 2020-08-19 NOTE — Telephone Encounter (Signed)
Take 80 mg furosemide twice daily until weight is less than 220 pounds.  Resume 80 mg a.m. and 40 mg p.m.

## 2020-08-19 NOTE — Telephone Encounter (Signed)
Pt c/o swelling: STAT is pt has developed SOB within 24 hours  1) How much weight have you gained and in what time span? Patients wife says he gained 1lb in a day.  2) If swelling, where is the swelling located? Calves & feet  3) Are you currently taking a fluid pill? Yes, 3 different kinds  4) Are you currently SOB? When laying down, not worse than it has been.  5) Do you have a log of your daily weights (if so, list)? 224 --- for 3 days and today it was 225.  6) Have you gained 3 pounds in a day or 5 pounds in a week? No  7) Have you traveled recently? No   Patients wife wants to know how much fluid pills patient should take to manage weight.

## 2020-08-19 NOTE — Telephone Encounter (Signed)
Spoke with wife and made her aware of recommendations.  Wife verbalized understanding and was appreciative for call.

## 2020-08-31 ENCOUNTER — Emergency Department (HOSPITAL_COMMUNITY)
Admission: EM | Admit: 2020-08-31 | Discharge: 2020-08-31 | Disposition: A | Discharge status: Home / Self Care | Payer: Medicare Other | Attending: Emergency Medicine | Admitting: Emergency Medicine

## 2020-08-31 ENCOUNTER — Emergency Department (HOSPITAL_COMMUNITY): Payer: Medicare Other

## 2020-08-31 ENCOUNTER — Telehealth: Payer: Self-pay | Admitting: Nurse Practitioner

## 2020-08-31 ENCOUNTER — Other Ambulatory Visit: Payer: Self-pay

## 2020-08-31 DIAGNOSIS — E1122 Type 2 diabetes mellitus with diabetic chronic kidney disease: Secondary | ICD-10-CM | POA: Insufficient documentation

## 2020-08-31 DIAGNOSIS — I2510 Atherosclerotic heart disease of native coronary artery without angina pectoris: Secondary | ICD-10-CM | POA: Insufficient documentation

## 2020-08-31 DIAGNOSIS — S51811A Laceration without foreign body of right forearm, initial encounter: Secondary | ICD-10-CM | POA: Insufficient documentation

## 2020-08-31 DIAGNOSIS — N1832 Chronic kidney disease, stage 3b: Secondary | ICD-10-CM | POA: Insufficient documentation

## 2020-08-31 DIAGNOSIS — I5041 Acute combined systolic (congestive) and diastolic (congestive) heart failure: Secondary | ICD-10-CM | POA: Diagnosis not present

## 2020-08-31 DIAGNOSIS — Z794 Long term (current) use of insulin: Secondary | ICD-10-CM | POA: Insufficient documentation

## 2020-08-31 DIAGNOSIS — Z7984 Long term (current) use of oral hypoglycemic drugs: Secondary | ICD-10-CM | POA: Diagnosis not present

## 2020-08-31 DIAGNOSIS — W01198A Fall on same level from slipping, tripping and stumbling with subsequent striking against other object, initial encounter: Secondary | ICD-10-CM | POA: Insufficient documentation

## 2020-08-31 DIAGNOSIS — Z96651 Presence of right artificial knee joint: Secondary | ICD-10-CM | POA: Insufficient documentation

## 2020-08-31 DIAGNOSIS — Z7901 Long term (current) use of anticoagulants: Secondary | ICD-10-CM | POA: Insufficient documentation

## 2020-08-31 DIAGNOSIS — Z950 Presence of cardiac pacemaker: Secondary | ICD-10-CM | POA: Insufficient documentation

## 2020-08-31 DIAGNOSIS — S0101XA Laceration without foreign body of scalp, initial encounter: Secondary | ICD-10-CM

## 2020-08-31 DIAGNOSIS — S0990XA Unspecified injury of head, initial encounter: Secondary | ICD-10-CM | POA: Diagnosis present

## 2020-08-31 DIAGNOSIS — I130 Hypertensive heart and chronic kidney disease with heart failure and stage 1 through stage 4 chronic kidney disease, or unspecified chronic kidney disease: Secondary | ICD-10-CM | POA: Diagnosis not present

## 2020-08-31 DIAGNOSIS — Y9301 Activity, walking, marching and hiking: Secondary | ICD-10-CM | POA: Diagnosis not present

## 2020-08-31 DIAGNOSIS — S61412A Laceration without foreign body of left hand, initial encounter: Secondary | ICD-10-CM | POA: Insufficient documentation

## 2020-08-31 MED ORDER — LIDOCAINE-EPINEPHRINE 1 %-1:100000 IJ SOLN
10.0000 mL | Freq: Once | INTRAMUSCULAR | Status: AC
  Administered 2020-08-31: 19:00:00 10 mL
  Filled 2020-08-31: qty 1

## 2020-08-31 NOTE — ED Notes (Signed)
Pt resting in bed. NADN 

## 2020-08-31 NOTE — Telephone Encounter (Signed)
   Wife called to report that last night pt lost his balance, fell, and struck his head w/ a fair amount of bleeding.  She is wondering if any further intervention is required today.  No further external bleeding.  She feels his mental status is @ baseline.  I rec that given chronic anticoagulation w/ eliquis, that he will require a head CT to r/o intracranial bleeding.  I rec that she bring him to ED today and explained that there was no way to do this on an outpt basis today, and that waiting till later in the week could be detrimental.  Caller verbalized understanding and was grateful for the call back.  Murray Hodgkins, NP 08/31/2020, 11:17 AM

## 2020-08-31 NOTE — Discharge Instructions (Signed)
Head Injury You have been seen today for a head injury, which may or may not have resulted in a concussion. It does not appear to be serious at this time, however, it is important to note that your presentation today is not necessarily an indication of the severity of future symptoms.  Expected symptoms: Expected symptoms of concussion and/or head injury can include nausea, headache, mild dizziness (should still be able to get up and walk around without difficulty), difficulty concentrating, increased sleep, difficulty sleeping, increased intensity of emotions. Close observation: The close observation period is usually 6 hours from the injury. This includes staying awake and having a trustworthy adult monitor you to assure your condition does not worsen. You should be in regular contact with this person and ideally, they should be able to monitor you in person.  Secondary observation: The secondary observation period is usually 24 hours from the injury. You are allowed to sleep during this time. A trustworthy adult should intermittently monitor you to assure your condition does not worsen.   Overall head injury/concussion care: Rest: Be sure to get plenty of rest. You will need more rest and sleep while you recover. Hydration: Be sure to stay well hydrated by having a goal of drinking about 0.5 liters of water an hour. Pain:  Acetaminophen: May take acetaminophen (generic for Tylenol), as needed, for pain. Your daily total maximum amount of acetaminophen from all sources should be limited to 4000mg /day for persons without liver problems, or 2000mg /day for those with liver problems. Return to sports and activities: In general, you may return to normal activities once symptoms have subsided, however, you would ideally be cleared by a primary care provider or other qualified medical professional prior to return to these activities.  Follow up: Follow up with the concussion clinic or your primary care  provider for further management of this issue. Return: Return to the ED should you begin to have confusion, abnormal behavior, aggression, violence, or personality changes, repeated vomiting, vision loss, numbness or weakness on one side of the body, difficulty standing due to dizziness, significantly worsening pain, or any other major concerns.    Wound Care - Laceration You may remove the bandage after 24 hours. Clean the wound and surrounding area gently with tap water and mild soap. Rinse well and blot dry. Do not scrub the wound, as this may cause the wound edges to come apart. You may shower, but avoid submerging the wound, such as with a bath or swimming. Clean the wound daily to prevent infection. Do not use cleaners such as hydrogen peroxide or alcohol.   Scar reduction: Application of a topical antibiotic ointment, such as Neosporin, after the wound has begun to close and heal well can decrease scab formation and reduce scarring. After the wound has healed and wound closures have been removed, application of ointments such as Aquaphor can also reduce scar formation.  The key to scar reduction is keeping the skin well hydrated and supple. Drinking plenty of water throughout the day (At least eight 8oz glasses of water a day) is essential to staying well hydrated.  Sun exposure: Keep the wound out of the sun. After the wound has healed, continue to protect it from the sun by wearing protective clothing or applying sunscreen.  Pain: You may use Tylenol for pain.  Suture/staple removal: Return to the ED in 5 days for staple removal.  You may also call your doctors office to see if they would be willing to remove  the staples instead.  Return to the ED sooner should the wound edges come apart or signs of infection arise, such as spreading redness, puffiness/swelling, pus draining from the wound, severe increase in pain, fever over 100.40F, or any other major issues.  For prescription  assistance, may try using prescription discount sites or apps, such as goodrx.com

## 2020-08-31 NOTE — ED Provider Notes (Signed)
Medical screening examination/treatment/procedure(s) were conducted as a shared visit with non-physician practitioner(s) and myself.  I personally evaluated the patient during the encounter.    Patient takes Eliquis.  Yesterday evening he slipped and tried to catch himself but ended up striking his head on the stove.  There is a laceration of the top of his head.  Denies severe headache, blurred vision, nausea or vomiting  Patient is alert and appropriate.  No confusion.  No respiratory distress.  All movements coordinated purposeful symmetric.  Wound has been cleaned and dressed and repaired.  I agree with plan of management.  Return precautions reviewed.   Charlesetta Shanks, MD 08/31/20 1945

## 2020-08-31 NOTE — ED Provider Notes (Signed)
[facility] EMERGENCY DEPARTMENT Provider Note   CSN: [id number] Arrival date & time: 08/31/20  1224     History No chief complaint on file.   Todd Romero is a 86 y.o. male.  HPI      Todd Romero is a 86 y.o. male, with a history of anemia, CAD, CHF, DM, HTN, presenting to the ED for evaluation following a fall and laceration to the head that occurred around 2 AM this morning. Patient states he is frequently up late at night.  He lost his balance while walking, which has happened before.  He struck his head on an object.  He has a laceration to the scalp.  He also has wounds to each upper extremity. He is anticoagulated. Tetanus vaccination up-to-date. Denies LOC, nausea/vomiting, neck/back pain, chest pain, shortness of breath, abdominal pain, pain in the extremities or joints, dizziness, numbness, weakness, or any other complaints or injuries.    Past Medical History:  Diagnosis Date  . AKI (acute kidney injury) ([place]) 10/24/2019  . Anemia   . Arthritis   . CAD (coronary artery disease)    a. cardiac cath 12/2016 showing moderate AS, elevated LVEDP, heavy 3V coronary calcification, 100% mD2, 30-50% LAD, 50-90% stenosis of Cx proximal to origin of L-PDA, RCA not engaged due to poor catheter control (difficult procedure) - coronary status was essentially unchanged from prior.  . Chicken pox   . Chronic diastolic CHF (congestive heart failure) ([place])   . Colon polyps   . Degenerative arthritis   . Diabetes ([place])   . Dyspnea   . Dyspnea on exertion 02/11/2015  . Essential hypertension   . Heart disease   . Heme positive stool 11/16/2017  . Hyperlipidemia   . New onset a-fib ([place]) 03/04/2018  . Obesity (BMI 30-39.9) 06/18/2015  . OSA (obstructive sleep apnea)    Severe with AHI 27/hr now on CPAP  not compliant with treatment.  . Peritonitis ([place]) 1985?  [name] Pneumonia    "6 months - 86 years old"  . Pure hypercholesterolemia   . RBBB   . [facility] spotted fever   . 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 Severe aortic stenosis    a. s/p TAVR 03/2017.  . Type II or unspecified type diabetes mellitus without mention of complication, not stated as uncontrolled   . Varicose vein of leg     Patient Active Problem List   Diagnosis Date Noted  . Sleep disturbance 04/23/2020  . Tachycardia-bradycardia syndrome ([place]) 02/06/2020  . Pacemaker 11/08/2019  . Chronic anticoagulation 11/08/2019  . Symptomatic bradycardia 10/24/2019  . Benign prostatic hyperplasia 10/01/2019  . Stage 3b chronic kidney disease ([place]) 10/01/2019  . Secondary hypercoagulable state ([place]) 09/24/2019  . 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 Chronic combined systolic and diastolic CHF (congestive heart failure) ([place]) 03/27/2018  . Persistent atrial fibrillation ([place]) 03/04/2018  . GIB (gastrointestinal bleeding) 11/18/2017  . Anemia 11/16/2017  . Leukocytosis 11/16/2017  . 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 Posterior vitreous detachment, left 03/02/2016  . Pseudophakia of both eyes 03/02/2016  . Aortic stenosis 12/18/2015  . Obesity (BMI 30-39.9) 06/18/2015  . Intermediate stage nonexudative age-related macular degeneration of both eyes 02/27/2015  . Obstructive sleep apnea 02/11/2015  . Primary osteoarthritis of right knee 03/18/2014  . Essential hypertension, benign 06/21/2013    Class: Chronic  .  RBBB   . CAD (coronary artery disease)     Class: Chronic  . Hyperlipidemia   . Type 2 diabetes mellitus ([place]) 05/02/2013  . Retinal tear, left 11/16/2011    Past Surgical History:  Procedure Laterality Date  . APPENDECTOMY  1985   peritonitis  . CARDIOVERSION N/A 04/03/2018   Procedure: CARDIOVERSION;  Surgeon: Josue Hector, MD;  Location: [facility] ENDOSCOPY;  Service: Cardiovascular;  Laterality: N/A;   . CARDIOVERSION N/A 01/18/2019   Procedure: CARDIOVERSION;  Surgeon: Skeet Latch, MD;  Location: [facility];  Service: Cardiovascular;  Laterality: N/A;  . CARPAL TUNNEL RELEASE    . COLONOSCOPY WITH PROPOFOL N/A 11/18/2017   Procedure: COLONOSCOPY WITH PROPOFOL;  Surgeon: Carol Ada, MD;  Location: [facility] ENDOSCOPY;  Service: Endoscopy;  Laterality: N/A;  . ESOPHAGOGASTRODUODENOSCOPY N/A 11/18/2017   Procedure: ESOPHAGOGASTRODUODENOSCOPY (EGD);  Surgeon: Carol Ada, MD;  Location: [facility];  Service: Endoscopy;  Laterality: N/A;  . EYE SURGERY Bilateral    cataracts removed, lens placed  . JOINT REPLACEMENT    . KNEE CARTILAGE SURGERY    . PACEMAKER IMPLANT N/A 10/25/2019   Procedure: PACEMAKER IMPLANT;  Surgeon: Evans Lance, MD;  Location: [facility] CV LAB;  Service: Cardiovascular;  Laterality: N/A;  . PARTIAL COLECTOMY    . RIGHT/LEFT HEART CATH AND CORONARY ANGIOGRAPHY N/A 12/28/2016   Procedure: Right/Left Heart Cath and Coronary Angiography;  Surgeon: Belva Crome, MD;  Location: [facility] CV LAB;  Service: Cardiovascular;  Laterality: N/A;  . TEE WITHOUT CARDIOVERSION N/A 03/15/2017   Procedure: TRANSESOPHAGEAL ECHOCARDIOGRAM (TEE);  Surgeon: Burnell Blanks, MD;  Location: [facility];  Service: Open Heart Surgery;  Laterality: N/A;  . TONSILLECTOMY AND ADENOIDECTOMY  1942  . TOTAL KNEE ARTHROPLASTY Right 11/08/2017  . TOTAL KNEE ARTHROPLASTY Right 11/08/2017   Procedure: TOTAL KNEE ARTHROPLASTY;  Surgeon: Melrose Nakayama, MD;  Location: [facility];  Service: Orthopedics;  Laterality: Right;  . TRANSCATHETER AORTIC VALVE REPLACEMENT, TRANSFEMORAL N/A 03/15/2017   Procedure: TRANSCATHETER AORTIC VALVE REPLACEMENT, TRANSFEMORAL;  Surgeon: Burnell Blanks, MD;  Location: [facility];  Service: Open Heart Surgery;  Laterality: N/A;  . VEIN SURGERY  1980       Family History  Problem Relation Age of Onset  . Heart failure Father   . Emphysema Father   . COPD Father    . Early death Father   . Heart attack Father   . Hypertension Mother   . Early death Mother   . Hyperlipidemia Mother   . Stroke Mother   . Cancer Sister   . Hypertension Sister   . Healthy Sister     Social History   Tobacco Use  . Smoking status: Never Smoker  . Smokeless tobacco: Never Used  Vaping Use  . Vaping Use: Never used  Substance Use Topics  . Alcohol use: No  . Drug use: No    Home Medications Prior to Admission medications   Medication Sig Start Date End Date Taking? Authorizing Provider  acetaminophen (TYLENOL) 500 MG tablet Take 500 mg by mouth every 4 (four) hours as needed for moderate pain or headache.   Yes [provider]  amiodarone (PACERONE) 200 MG tablet TAKE 1/2 TABLET BY MOUTH DAILY 06/25/20  Yes Allred, Jeneen Rinks, MD  Cholecalciferol (VITAMIN D) 50 MCG (2000 UT) tablet Take 2,000 Units by mouth daily.   Yes [provider]  ELIQUIS 2.5 MG TABS tablet TAKE 1 TABLET TWICE A DAY (DISCONTINUE 5 MG TABLET) 12/18/19  Yes  Belva Crome, MD  ferrous sulfate 325 (65 FE) MG tablet Take 325 mg by mouth 2 (two) times daily with a meal.    Yes [provider]  furosemide (LASIX) 40 MG tablet Take 2 tablets (80mg  total) by mouth every morning.  Take one tablet by mouth every evening. 08/19/20  Yes Belva Crome, MD  glimepiride (AMARYL) 2 MG tablet TAKE 1 TABLET DAILY BEFORE BREAKFAST 07/03/20  Yes [name], MD  insulin lispro (HUMALOG KWIKPEN) 100 UNIT/ML KwikPen Inject 12 Units into the skin 2 (two) times daily before lunch and supper. Inject 12 units under skin before lunch and dinner. May also inject 4 unit at breakfast is sugar reading is over 100. Patient taking differently: Inject 8-16 Units into the skin 3 (three) times daily. Inject 8 units for breakfast then Take 8-16 units as needed for under skin before lunch and dinner. May also inject 4 unit at breakfast is sugar reading is over 100. 05/28/20  Yes [name], MD  insulin NPH  Human (HUMULIN N) 100 UNIT/ML injection Inject 26 units of Humulin N under the skin once daily at bedtime. 01/28/20  Yes [name], MD  metolazone (ZAROXOLYN) 5 MG tablet TAKEN ONE TABLET BY MOUTH ON MONDAY AND THURSDAY. TAKE 30 MINUTES PRIOR TO FUROSEMIDE. 05/07/20  Yes Belva Crome, MD  Multiple Vitamin (MULTIVITAMIN WITH MINERALS) TABS tablet Take 1 tablet by mouth daily. Mens One a day Vit   Yes [provider]  Multiple Vitamins-Minerals (PRESERVISION AREDS 2 PO) Take 1 capsule by mouth 2 (two) times daily.   Yes [provider]  pantoprazole (PROTONIX) 40 MG tablet Take 1 tablet (40 mg total) by mouth 2 (two) times daily. 11/19/19  Yes Belva Crome, MD  pravastatin (PRAVACHOL) 80 MG tablet Take 1 tablet (80 mg total) by mouth every evening. 09/26/19  Yes Kuneff, Renee A, DO  spironolactone (ALDACTONE) 25 MG tablet Take 0.5 tablets (12.5 mg total) by mouth every Monday, Wednesday, and Friday. 12/05/19  Yes Belva Crome, MD  tamsulosin [facility]) 0.4 MG CAPS capsule Take 1 capsule daily 06/30/20  Yes Kuneff, Renee A, DO  VICTOZA 18 MG/3ML SOPN INJECT 1.8 MG UNDER THE SKIN AT BEDTIME 09/17/19  Yes [name], MD  ACCU-CHEK AVIVA PLUS test strip USE AS INSTRUCTED TO CHECK BLOOD SUGAR TWICE A DAY DX:E11.65 06/01/20   [name], MD  B-D UF III MINI PEN NEEDLES 31G X 5 MM MISC USE 2 PEN NEEDLE PER DAY WITH HUMALOG AND VICTOZA 06/24/20   [name], MD  clonazePAM (KLONOPIN) 0.5 MG tablet Take 1-2 tablets (0.5-1 mg total) by mouth at bedtime. Patient not taking: No sig reported 04/23/20   Kuneff, Renee A, DO  Insulin Syringe-Needle U-100 (BD INSULIN SYRINGE U/F) 31G X 5/16" 1 ML MISC Use to inject insulin daily. DX:E11.9 04/18/20   [name], MD    Allergies    Bactrim [sulfamethoxazole-trimethoprim] and Morphine and related  Review of Systems   Review of Systems  Constitutional: Negative for fever.  Respiratory: Negative for shortness of breath.   Cardiovascular: Negative  for chest pain.  Gastrointestinal: Negative for abdominal pain, nausea and vomiting.  Musculoskeletal: Negative for back pain and neck pain.  Skin: Positive for wound.  Neurological: Negative for dizziness, syncope, weakness, numbness and headaches.  All other systems reviewed and are negative.   Physical Exam Updated Vital Signs BP 138/68   Pulse 76   Temp 98 F (36.7 C)   Resp  20   SpO2 98%   Physical Exam Vitals and nursing note reviewed.  Constitutional:      General: He is not in acute distress.    Appearance: He is well-developed. He is not diaphoretic.  HENT:     Head: Normocephalic.     Comments: Proximately 4 cm laceration to the occipital scalp with some swelling immediately surrounding laceration.  No noted deformity or instability. The rest of the scalp and face were examined without evidence of additional injury.    Mouth/Throat:     Mouth: Mucous membranes are moist.     Pharynx: Oropharynx is clear.  Eyes:     Conjunctiva/sclera: Conjunctivae normal.  Cardiovascular:     Rate and Rhythm: Normal rate and regular rhythm.     Pulses: Normal pulses.          Radial pulses are 2+ on the right side and 2+ on the left side.       Posterior tibial pulses are 2+ on the right side and 2+ on the left side.     Heart sounds: Normal heart sounds.     Comments: Tactile temperature in the extremities appropriate and equal bilaterally. Patient states lower extremity edema is not new for him. Pulmonary:     Effort: Pulmonary effort is normal. No respiratory distress.     Breath sounds: Normal breath sounds.  Abdominal:     Palpations: Abdomen is soft.     Tenderness: There is no abdominal tenderness. There is no guarding.  Musculoskeletal:     Cervical back: Neck supple.     Right lower leg: 2+ Pitting Edema present.     Left lower leg: 2+ Pitting Edema present.     Comments: Normal motor function intact in all extremities. No midline spinal tenderness.   The upper  and lower extremities were palpated and examined without noted swelling, instability, deformity, or pain with range of motion.  Lymphadenopathy:     Cervical: No cervical adenopathy.  Skin:    General: Skin is warm and dry.     Comments: Skin tear noted to the left dorsal hand.  No noted deformity, instability, or swelling.  Full range of motion without noted pain or difficulty in the left fingers and wrist.  Skin tear noted to the right forearm.  No swelling, deformity, or instability noted.  Range of motion in the joints of the right upper extremity without pain or noted difficulty.  Neurological:     Mental Status: He is alert and oriented to person, place, and time.     Comments: No noted acute cognitive deficit. Sensation grossly intact to light touch in the extremities.   Grip strengths equal bilaterally.   Strength 5/5 in all extremities.  No gait disturbance.  Coordination intact.  Cranial nerves III-XII grossly intact.  Handles oral secretions without noted difficulty.  No noted phonation or speech deficit. No facial droop.   Psychiatric:        Mood and Affect: Mood and affect normal.        Speech: Speech normal.        Behavior: Behavior normal.              ED Results / Procedures / Treatments   Labs (all labs ordered are listed, but only abnormal results are displayed) Labs Reviewed - No data to display  EKG None  Radiology CT Head Wo Contrast  Result Date: 08/31/2020 CLINICAL DATA:  Neck and facial trauma. EXAM: CT HEAD  WITHOUT CONTRAST CT CERVICAL SPINE WITHOUT CONTRAST TECHNIQUE: Multidetector CT imaging of the head and cervical spine was performed following the standard protocol without intravenous contrast. Multiplanar CT image reconstructions of the cervical spine were also generated. COMPARISON:  Head CT 01/30/2008 FINDINGS: CT HEAD FINDINGS Brain: Ventricles, cisterns and other CSF spaces are normal. There is chronic ischemic microvascular disease.  There is no mass, mass effect, shift of midline structures or acute hemorrhage. No evidence of acute infarction. Vascular: No hyperdense vessel or unexpected calcification. Skull: No acute fracture. Sinuses/Orbits: No acute finding. Other: None. CT CERVICAL SPINE FINDINGS Alignment: No posttraumatic subluxation. Skull base and vertebrae: Vertebral body heights are normal. There is mild to moderate spondylosis throughout the cervical spine. Atlantoaxial articulation is unremarkable. There is uncovertebral joint spurring and facet arthropathy. No acute fracture. Right-sided neural foraminal narrowing at the C3-4 level with left-sided neural foraminal narrowing at the C4-5 level. Bilateral neural foraminal narrowing at the C6-7 level and to lesser extent at the C5-6 level. Soft tissues and spinal canal: No prevertebral fluid or swelling. No visible canal hematoma. Disc levels: Disc space narrowing at the C4-5, C5-6 and C6-7 levels as well as the C7-T1 level. Upper chest: No acute findings. Other: 1.4 cm hypodense nodule over the lower right lobe of the thyroid. IMPRESSION: 1. No acute brain injury. 2. Chronic ischemic microvascular disease. 3. No acute cervical spine injury. 4. Mild to moderate spondylosis of the cervical spine with multilevel disc disease and multilevel neural foraminal narrowing as described. 5. 1.4 cm right thyroid nodule. Recommend follow-up thyroid ultrasound for further evaluation. (Ref: J Am Coll Radiol. 2015 Feb;12(2): 143-50). Electronically Signed   By: Marin Olp M.D.   On: 08/31/2020 14:26   CT Cervical Spine Wo Contrast  Result Date: 08/31/2020 CLINICAL DATA:  Neck and facial trauma. EXAM: CT HEAD WITHOUT CONTRAST CT CERVICAL SPINE WITHOUT CONTRAST TECHNIQUE: Multidetector CT imaging of the head and cervical spine was performed following the standard protocol without intravenous contrast. Multiplanar CT image reconstructions of the cervical spine were also generated. COMPARISON:   Head CT 01/30/2008 FINDINGS: CT HEAD FINDINGS Brain: Ventricles, cisterns and other CSF spaces are normal. There is chronic ischemic microvascular disease. There is no mass, mass effect, shift of midline structures or acute hemorrhage. No evidence of acute infarction. Vascular: No hyperdense vessel or unexpected calcification. Skull: No acute fracture. Sinuses/Orbits: No acute finding. Other: None. CT CERVICAL SPINE FINDINGS Alignment: No posttraumatic subluxation. Skull base and vertebrae: Vertebral body heights are normal. There is mild to moderate spondylosis throughout the cervical spine. Atlantoaxial articulation is unremarkable. There is uncovertebral joint spurring and facet arthropathy. No acute fracture. Right-sided neural foraminal narrowing at the C3-4 level with left-sided neural foraminal narrowing at the C4-5 level. Bilateral neural foraminal narrowing at the C6-7 level and to lesser extent at the C5-6 level. Soft tissues and spinal canal: No prevertebral fluid or swelling. No visible canal hematoma. Disc levels: Disc space narrowing at the C4-5, C5-6 and C6-7 levels as well as the C7-T1 level. Upper chest: No acute findings. Other: 1.4 cm hypodense nodule over the lower right lobe of the thyroid. IMPRESSION: 1. No acute brain injury. 2. Chronic ischemic microvascular disease. 3. No acute cervical spine injury. 4. Mild to moderate spondylosis of the cervical spine with multilevel disc disease and multilevel neural foraminal narrowing as described. 5. 1.4 cm right thyroid nodule. Recommend follow-up thyroid ultrasound for further evaluation. (Ref: J Am Coll Radiol. 2015 Feb;12(2): 143-50). Electronically Signed   By:  Marin Olp M.D.   On: 08/31/2020 14:26    Procedures .Marland KitchenLaceration Repair  Date/Time: 08/31/2020 7:25 PM Performed by: Lorayne Bender, PA-C Authorized by: Lorayne Bender, PA-C   Consent:    Consent obtained:  Verbal   Consent given by:  Patient   Risks, benefits, and alternatives  were discussed: yes     Risks discussed:  Infection, need for additional repair, poor wound healing, poor cosmetic result, vascular damage and pain Universal protocol:    Patient identity confirmed:  Verbally with patient and provided demographic data Laceration details:    Location:  Scalp   Scalp location:  Occipital   Length (cm):  4 Pre-procedure details:    Preparation:  Patient was prepped and draped in usual sterile fashion and imaging obtained to evaluate for foreign bodies Exploration:    Hemostasis achieved with:  Direct pressure   Wound exploration: wound explored through full range of motion and entire depth of wound visualized   Treatment:    Area cleansed with:  Saline   Amount of cleaning:  Standard   Irrigation solution:  Sterile saline   Irrigation method:  Syringe Skin repair:    Repair method:  Staples   Number of staples:  4 Approximation:    Approximation:  Close Repair type:    Repair type:  Simple Post-procedure details:    Dressing:  Non-adherent dressing   Procedure completion:  Tolerated well, no immediate complications   (including critical care time)  Medications Ordered in ED Medications  lidocaine-EPINEPHrine (XYLOCAINE W/EPI) 1 %-1:100000 (with pres) injection 10 mL (10 mLs Infiltration Given by Other 08/31/20 1858)    ED Course  I have reviewed the triage vital signs and the nursing notes.  Pertinent labs & imaging results that were available during my care of the patient were reviewed by me and considered in my medical decision making (see chart for details).    MDM Rules/Calculators/A&P                          Patient presents for evaluation following a fall. Laceration to the scalp repaired without immediate complication. I personally reviewed and interpreted the patient's imaging studies. No acute abnormalities on CT scans. The patient was given instructions for home care as well as return precautions. Patient voices understanding  of these instructions, accepts the plan, and is comfortable with discharge.  Findings and plan of care discussed with attending physician, Charlesetta Shanks, MD. Dr. Johnney Killian personally evaluated and examined this patient.  Vitals:   08/31/20 1830 08/31/20 1845 08/31/20 1900 08/31/20 1947  BP: 138/78 (!) 142/77 140/72   Pulse: 70 67 70   Resp:  (!) 22 20   Temp:    98.1 F (36.7 C)  TempSrc:      SpO2: 100% 98% 99%     Final Clinical Impression(s) / ED Diagnoses Final diagnoses:  Injury of head, initial encounter  Laceration of scalp, initial encounter    Rx / DC Orders ED Discharge Orders    None       Layla Maw 09/01/20 0153    Charlesetta Shanks, MD 09/01/20 [phone]

## 2020-08-31 NOTE — ED Triage Notes (Signed)
Patient reports that he fell around midnight last night and states he hit head on stove. Patient takes eliquis, no loc. Laceration noted to head. Abrasions to hand and back pain

## 2020-09-01 ENCOUNTER — Encounter: Payer: Self-pay | Admitting: Family Medicine

## 2020-09-01 DIAGNOSIS — M47812 Spondylosis without myelopathy or radiculopathy, cervical region: Secondary | ICD-10-CM | POA: Insufficient documentation

## 2020-09-01 DIAGNOSIS — E041 Nontoxic single thyroid nodule: Secondary | ICD-10-CM | POA: Insufficient documentation

## 2020-09-08 ENCOUNTER — Other Ambulatory Visit: Payer: Self-pay

## 2020-09-09 ENCOUNTER — Ambulatory Visit (INDEPENDENT_AMBULATORY_CARE_PROVIDER_SITE_OTHER): Payer: Medicare Other | Admitting: Family Medicine

## 2020-09-09 ENCOUNTER — Telehealth: Payer: Self-pay | Admitting: Endocrinology

## 2020-09-09 ENCOUNTER — Encounter: Payer: Self-pay | Admitting: Family Medicine

## 2020-09-09 ENCOUNTER — Other Ambulatory Visit: Payer: Self-pay | Admitting: *Deleted

## 2020-09-09 VITALS — BP 130/76 | HR 68 | Temp 98.4°F | Resp 16 | Ht 67.0 in | Wt 231.0 lb

## 2020-09-09 DIAGNOSIS — S0101XA Laceration without foreign body of scalp, initial encounter: Secondary | ICD-10-CM | POA: Diagnosis not present

## 2020-09-09 DIAGNOSIS — E041 Nontoxic single thyroid nodule: Secondary | ICD-10-CM

## 2020-09-09 MED ORDER — VICTOZA 18 MG/3ML ~~LOC~~ SOPN: PEN_INJECTOR | SUBCUTANEOUS | 3 refills | Status: AC

## 2020-09-09 NOTE — Progress Notes (Signed)
This visit occurred during the SARS-CoV-2 public health emergency.  Safety protocols were in place, including screening questions prior to the visit, additional usage of staff PPE, and extensive cleaning of exam room while observing appropriate contact time as indicated for disinfecting solutions.    Todd Romero , 1933/12/22, 87 y.o., male MRN: [id number] Patient Care Team    Relationship Specialty Notifications Start End  Ma Hillock, DO PCP - General Family Medicine  09/13/19   Belva Crome, MD PCP - Cardiology Cardiology  10/21/17   [name], MD Consulting Physician Endocrinology  09/25/19   Thompson Grayer, MD Consulting Physician Cardiology  10/01/19   Danice Goltz, MD Consulting Physician Ophthalmology  10/01/19   Monna Fam, MD Consulting Physician Ophthalmology  10/01/19   Carol Ada, MD Consulting Physician Gastroenterology  10/01/19   Evans Lance, MD Consulting Physician Cardiology  11/08/19     Chief Complaint  Patient presents with  . Suture / Staple Removal    Placed on 12/26-advised to remove 5-7 days after      Subjective: Pt presents for an OV for staple removal of scalp. He was seen in the ED 12/26 d/t fall with head laceration. 4 Staples placed that time and he was told to follow up with pcp for removal. Pt is anticoagulated with eliquis.  CT the head was performed without acute processes.  Patient's wife is present today and states he has been healing well no redness, swelling or drainage over the area. In review of of his ED work up, noted CT of head incidentally noted thyroid nodule that met criteria for additional work up>" 1.4 cm right thyroid nodule. Recommend follow-up thyroid ultrasound for further evaluation. (Ref: J Am Coll Radiol. 2015 Feb;12(2): 143-50)."  Depression screen PHQ 2/9 05/06/2017  Decreased Interest 0  Down, Depressed, Hopeless 0  PHQ - 2 Score 0  Some recent data might be hidden    Allergies  Allergen  Reactions  . Bactrim [Sulfamethoxazole-Trimethoprim] Nausea And Vomiting and Other (See Comments)    Bleeding and ulcers  . 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 AKI (acute kidney injury) ([place]) 10/24/2019  . Anemia   . Arthritis   . CAD (coronary artery disease)    a. cardiac cath 12/2016 showing moderate AS, elevated LVEDP, heavy 3V coronary calcification, 100% mD2, 30-50% LAD, 50-90% stenosis of Cx proximal to origin of L-PDA, RCA not engaged due to poor catheter control (difficult procedure) - coronary status was essentially unchanged from prior.  . Chicken pox   . Chronic diastolic CHF (congestive heart failure) ([place])   . Colon polyps   . Degenerative arthritis   . Diabetes ([place])   . Dyspnea   . Dyspnea on exertion 02/11/2015  . Essential hypertension   . Heart disease   . Heme positive stool 11/16/2017  . Hyperlipidemia   . New onset a-fib ([place]) 03/04/2018  . Obesity (BMI 30-39.9) 06/18/2015  . OSA (obstructive sleep apnea)    Severe with AHI 27/hr now on CPAP  not compliant with treatment.  . Peritonitis ([place]) 1985?  [name] Pneumonia    "  6 months - 87 years old"  . Pure hypercholesterolemia   . RBBB   . [facility] spotted fever   . 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 Severe aortic stenosis    a. s/p TAVR 03/2017.  . Type II or unspecified type diabetes mellitus without mention of complication, not stated as uncontrolled   . Varicose vein of leg    Past Surgical History:  Procedure Laterality Date  . APPENDECTOMY  1985   peritonitis  .  CARDIOVERSION N/A 04/03/2018   Procedure: CARDIOVERSION;  Surgeon: Josue Hector, MD;  Location: [facility] ENDOSCOPY;  Service: Cardiovascular;  Laterality: N/A;  . CARDIOVERSION N/A 01/18/2019   Procedure: CARDIOVERSION;  Surgeon: Skeet Latch, MD;  Location: [facility];  Service: Cardiovascular;  Laterality: N/A;  . CARPAL TUNNEL RELEASE    . COLONOSCOPY WITH PROPOFOL N/A 11/18/2017   Procedure: COLONOSCOPY WITH PROPOFOL;  Surgeon: Carol Ada, MD;  Location: [facility] ENDOSCOPY;  Service: Endoscopy;  Laterality: N/A;  . ESOPHAGOGASTRODUODENOSCOPY N/A 11/18/2017   Procedure: ESOPHAGOGASTRODUODENOSCOPY (EGD);  Surgeon: Carol Ada, MD;  Location: [facility] ENDOSCOPY;  Service: Endoscopy;  Laterality: N/A;  . EYE SURGERY Bilateral    cataracts removed, lens placed  . JOINT REPLACEMENT    . KNEE CARTILAGE SURGERY    . PACEMAKER IMPLANT N/A 10/25/2019   Procedure: PACEMAKER IMPLANT;  Surgeon: Evans Lance, MD;  Location: [facility] CV LAB;  Service: Cardiovascular;  Laterality: N/A;  . PARTIAL COLECTOMY    . RIGHT/LEFT HEART CATH AND CORONARY ANGIOGRAPHY N/A 12/28/2016   Procedure: Right/Left Heart Cath and Coronary Angiography;  Surgeon: Belva Crome, MD;  Location: [facility] CV LAB;  Service: Cardiovascular;  Laterality: N/A;  . TEE WITHOUT CARDIOVERSION N/A 03/15/2017   Procedure: TRANSESOPHAGEAL ECHOCARDIOGRAM (TEE);  Surgeon: Burnell Blanks, MD;  Location: [facility];  Service: Open Heart Surgery;  Laterality: N/A;  . TONSILLECTOMY AND ADENOIDECTOMY  1942  . TOTAL KNEE ARTHROPLASTY Right 11/08/2017  . TOTAL KNEE ARTHROPLASTY Right 11/08/2017   Procedure: TOTAL KNEE ARTHROPLASTY;  Surgeon: [place] Nakayama, MD;  Location: [facility];  Service: Orthopedics;  Laterality: Right;  . TRANSCATHETER AORTIC VALVE REPLACEMENT, TRANSFEMORAL N/A 03/15/2017   Procedure: TRANSCATHETER AORTIC VALVE REPLACEMENT, TRANSFEMORAL;  Surgeon: Burnell Blanks, MD;  Location: [facility];  Service: Open Heart Surgery;   Laterality: N/A;  . VEIN SURGERY  1980   Family History  Problem Relation Age of Onset  . Heart failure Father   . Emphysema Father   . COPD Father   . Early death Father   . Heart attack Father   . Hypertension Mother   . Early death Mother   . Hyperlipidemia Mother   . Stroke Mother   . Cancer Sister   . Hypertension Sister   . Healthy Sister    Allergies as of 09/09/2020      Reactions   Bactrim [sulfamethoxazole-trimethoprim] Nausea And Vomiting, Other (See Comments)   Bleeding and ulcers   Morphine And Related Nausea And Vomiting      Medication List       Accurate as of September 09, 2020 12:36 PM. If you have any questions, ask your nurse or doctor.        Accu-Chek Aviva Plus test strip Generic drug: glucose blood USE AS INSTRUCTED TO CHECK BLOOD SUGAR TWICE A DAY DX:E11.65   acetaminophen 500 MG tablet Commonly known as:  TYLENOL Take 500 mg by mouth every 4 (four) hours as needed for moderate pain or headache.   amiodarone 200 MG tablet Commonly known as: PACERONE TAKE 1/2 TABLET BY MOUTH DAILY   B-D UF III MINI PEN NEEDLES 31G X 5 MM Misc Generic drug: Insulin Pen Needle USE 2 PEN NEEDLE PER DAY WITH HUMALOG AND VICTOZA   clonazePAM 0.5 MG tablet Commonly known as: KLONOPIN Take 1-2 tablets (0.5-1 mg total) by mouth at bedtime.   Eliquis 2.5 MG Tabs tablet Generic drug: apixaban TAKE 1 TABLET TWICE A DAY (DISCONTINUE 5 MG TABLET)   ferrous sulfate 325 (65 FE) MG tablet Take 325 mg by mouth 2 (two) times daily with a meal.   furosemide 40 MG tablet Commonly known as: LASIX Take 2 tablets (38m total) by mouth every morning.  Take one tablet by mouth every evening.   glimepiride 2 MG tablet Commonly known as: AMARYL TAKE 1 TABLET DAILY BEFORE BREAKFAST   insulin lispro 100 UNIT/ML KwikPen Commonly known as: HumaLOG KwikPen Inject 12 Units into the skin 2 (two) times daily before lunch and supper. Inject 12 units under skin before lunch and  dinner. May also inject 4 unit at breakfast is sugar reading is over 100. What changed:   how much to take  when to take this  additional instructions   insulin NPH Human 100 UNIT/ML injection Commonly known as: HumuLIN N Inject 26 units of Humulin N under the skin once daily at bedtime.   Insulin Syringe-Needle U-100 31G X 5/16" 1 ML Misc Commonly known as: BD Insulin Syringe U/F Use to inject insulin daily. DX:E11.9   metolazone 5 MG tablet Commonly known as: ZAROXOLYN TAKEN ONE TABLET BY MOUTH ON MONDAY AND THURSDAY. TAKE 30 MINUTES PRIOR TO FUROSEMIDE.   multivitamin with minerals Tabs tablet Take 1 tablet by mouth daily. Mens One a day Vit   pantoprazole 40 MG tablet Commonly known as: PROTONIX Take 1 tablet (40 mg total) by mouth 2 (two) times daily.   pravastatin 80 MG tablet Commonly known as: PRAVACHOL Take 1 tablet (80 mg total) by mouth every evening.   PRESERVISION AREDS 2 PO Take 1 capsule by mouth 2 (two) times daily.   spironolactone 25 MG tablet Commonly known as: ALDACTONE Take 0.5 tablets (12.5 mg total) by mouth every Monday, Wednesday, and Friday.   tamsulosin 0.4 MG Caps capsule Commonly known as: FLOMAX Take 1 capsule daily   Victoza 18 MG/3ML Sopn Generic drug: liraglutide INJECT 1.8 MG UNDER THE SKIN AT BEDTIME   Vitamin D 50 MCG (2000 UT) tablet Take 2,000 Units by mouth daily.       All past medical history, surgical history, allergies, family history, immunizations andmedications were updated in the EMR today and reviewed under the history and medication portions of their EMR.     ROS: Negative, with the exception of above mentioned in HPI   Objective:  BP 130/76 (BP Location: Left Arm, Patient Position: Sitting, Cuff Size: Normal)   Pulse 68   Temp 98.4 F (36.9 C) (Oral)   Resp 16   Ht 5' 7"  (1.702 m)   Wt 231 lb (104.8 kg)   SpO2 100%   BMI 36.18 kg/m  Body mass index is 36.18 kg/m. Gen: Afebrile. No acute distress.  Nontoxic in appearance, well developed, well nourished.  Skin: Scalp laceration well approximated with 4 staples, no redness, no swelling, no drainage. Neuro: Normal gait. Alert. Oriented x3 .   No exam data  present No results found. No results found for this or any previous visit (from the past 24 hour(s)).  Assessment/Plan: Todd Romero is a 87 y.o. male present for OV for  Laceration of scalp without foreign body, initial encounter Patient seen in ED on 12/26 and x4 staples were inserted at that time by emergency room provider.  Presents today for staple removal at PCP office. Procedure: Staple removal Location: Laceration of scalp Area on crown of scalp was cleansed with Hibiclens.  Incision line was allowed to soak with moist gauze for approximately 5 minutes.  X4 staples easily removed without difficulty today.  Patient tolerated procedure well. No bleeding or drainage noted. And wife are instructed to keep area clean and dry.  Cleanse daily with normal soapy water.  Can apply antibiotic ointment over area until completely healed. Monitor for any redness or drainage.  If occurs call immediately to be seen.  Thyroid nodule: Discussed thyroid nodule with him today.  They were unaware of this finding on CT. We ordered ultrasound of the thyroid today.  We will call them with results.  He is already established with endocrine for other chronic condition.  If needed, will refer for thyroid nodule to his present endocrinologist.   Reviewed expectations re: course of current medical issues.  Discussed self-management of symptoms.  Outlined signs and symptoms indicating need for more acute intervention.  Patient verbalized understanding and all questions were answered.  Patient received an After-Visit Summary.    Orders Placed This Encounter  Procedures  . US THYROID   No orders of the defined types were placed in this encounter.  Referral Orders  No referral(s)  requested today     Note is dictated utilizing voice recognition software. Although note has been proof read prior to signing, occasional typographical errors still can be missed. If any questions arise, please do not hesitate to call for verification.   electronically signed by:  Howard Pouch, DO  [place]

## 2020-09-09 NOTE — Telephone Encounter (Signed)
Rx sent 

## 2020-09-09 NOTE — Telephone Encounter (Signed)
Patient is requesting a refill of Victoza to be sent to [facility]

## 2020-09-09 NOTE — Patient Instructions (Signed)
[facility] is located at [address] [place], [place] [address] > image center for Ultrasound of thyroid. They will call you.   Staples removed today. Wound looks great. Keep clean and dry.  Antibiotic ointment application     Thyroid Nodule  A thyroid nodule is an isolated growth of thyroid cells that forms a lump in your thyroid gland. The thyroid gland is a butterfly-shaped gland. It is found in the lower front of your neck. This gland sends chemical messengers (hormones) through your blood to all parts of your body. These hormones are important in regulating your body temperature and helping your body to use energy. Thyroid nodules are common. Most are not cancerous (benign). You may have one nodule or several nodules. Different types of thyroid nodules include nodules that:  Grow and fill with fluid (thyroid cysts).  Produce too much thyroid hormone (hot nodules or hyperthyroid).  Produce no thyroid hormone (cold nodules or hypothyroid).  Form from cancer cells (thyroid cancers). What are the causes? In most cases, the cause of this condition is not known. What increases the risk? The following factors may make you more likely to develop this condition.  Age. Thyroid nodules become more common in people who are older than 85 years of age.  Gender. ? Benign thyroid nodules are more common in women. ? Cancerous (malignant) thyroid nodules are more common in men.  A family history that includes: ? Thyroid nodules. ? Pheochromocytoma. ? Thyroid carcinoma. ? Hyperparathyroidism.  Certain kinds of thyroid diseases, such as Hashimoto's thyroiditis.  Lack of iodine in your diet.  A history of head and neck radiation, such as from previous cancer treatment. What are the signs or symptoms? In many cases, there are no symptoms. If you have symptoms, they may include:  A lump in your lower neck.  Feeling a lump or tickle in your throat.  Pain in your neck, jaw, or  ear.  Having trouble swallowing. Hot nodules may cause symptoms that include:  Weight loss.  Warm, flushed skin.  Feeling hot.  Feeling nervous.  A racing heartbeat. Cold nodules may cause symptoms that include:  Weight gain.  Dry skin.  Brittle hair. This may also occur with hair loss.  Feeling cold.  Fatigue. Thyroid cancer nodules may cause symptoms that include:  Hard nodules that feel stuck to the thyroid gland.  Hoarseness.  Lumps in the glands near your thyroid (lymph nodes). How is this diagnosed? A thyroid nodule may be felt by your health care provider during a physical exam. This condition may also be diagnosed based on your symptoms. You may also have tests, including:  An ultrasound. This may be done to confirm the diagnosis.  A biopsy. This involves taking a sample from the nodule and looking at it under a microscope.  Blood tests to make sure that your thyroid is working properly.  A thyroid scan. This test uses a radioactive tracer injected into a vein to create an image of the thyroid gland on a computer screen.  Imaging tests such as MRI or CT scan. These may be done if: ? Your nodule is large. ? Your nodule is blocking your airway. ? Cancer is suspected. How is this treated? Treatment depends on the cause and size of your nodule or nodules. If the nodule is benign, treatment may not be necessary. Your health care provider may monitor the nodule to see if it goes away without treatment. If the nodule continues to grow, is cancerous, or  does not go away, treatment may be needed. Treatment may include:  Having a cystic nodule drained with a needle.  Ablation therapy. In this treatment, alcohol is injected into the area of the nodule to destroy the cells. Ablation with heat (thermal ablation) may also be used.  Radioactive iodine. In this treatment, radioactive iodine is given as a pill or liquid that you drink. This substance causes the thyroid  nodule to shrink.  Surgery to remove the nodule. Part or all of your thyroid gland may need to be removed as well.  Medicines. Follow these instructions at home:  Pay attention to any changes in your nodule.  Take over-the-counter and prescription medicines only as told by your health care provider.  Keep all follow-up visits as told by your health care provider. This is important. Contact a health care provider if:  Your voice changes.  You have trouble swallowing.  You have pain in your neck, ear, or jaw that is getting worse.  Your nodule gets bigger.  Your nodule starts to make it harder for you to breathe.  Your muscles look like they are shrinking (muscle wasting). Get help right away if:  You have chest pain.  There is a loss of consciousness.  You have a sudden fever.  You feel confused.  You are seeing or hearing things that other people do not see or hear (having hallucinations).  You feel very weak.  You have mood swings.  You feel very restless.  You feel suddenly nauseous or throw up.  You suddenly have diarrhea. Summary  A thyroid nodule is an isolated growth of thyroid cells that forms a lump in your thyroid gland.  Thyroid nodules are common. Most are not cancerous (benign). You may have one nodule or several nodules.  Treatment depends on the cause and size of your nodule or nodules. If the nodule is benign, treatment may not be necessary.  Your health care provider may monitor the nodule to see if it goes away without treatment. If the nodule continues to grow, is cancerous, or does not go away, treatment may be needed. This information is not intended to replace advice given to you by your health care provider. Make sure you discuss any questions you have with your health care provider. Document Revised: 04/07/2018 Document Reviewed: 04/10/2018 Elsevier Patient Education  [place].

## 2020-09-11 ENCOUNTER — Other Ambulatory Visit: Payer: Self-pay

## 2020-09-11 ENCOUNTER — Ambulatory Visit (INDEPENDENT_AMBULATORY_CARE_PROVIDER_SITE_OTHER): Payer: Medicare Other

## 2020-09-11 ENCOUNTER — Telehealth: Payer: Self-pay | Admitting: Family Medicine

## 2020-09-11 DIAGNOSIS — E042 Nontoxic multinodular goiter: Secondary | ICD-10-CM | POA: Diagnosis not present

## 2020-09-11 DIAGNOSIS — E041 Nontoxic single thyroid nodule: Secondary | ICD-10-CM

## 2020-09-11 NOTE — Telephone Encounter (Signed)
Please call patient: His thyroid ultrasound had 6 nodules total in his thyroid.  A few of them need to be followed up yearly with ultrasound.  There was one of them in particular that was concerning giving its features.  It is recommended he have a biopsy of this particular nodule.  I will place a referral to his endocrinologist for thyroid nodules.  He is already established for his diabetes with Dr. Dwyane Dee.

## 2020-09-11 NOTE — Telephone Encounter (Signed)
Spoke with pt regarding labs and instructions.   

## 2020-09-12 ENCOUNTER — Telehealth: Payer: Self-pay | Admitting: *Deleted

## 2020-09-12 NOTE — Telephone Encounter (Signed)
Noted  

## 2020-09-12 NOTE — Telephone Encounter (Signed)
Will discuss on 1/22, let him know most nodules are benign

## 2020-09-15 ENCOUNTER — Other Ambulatory Visit: Payer: Self-pay

## 2020-09-15 ENCOUNTER — Other Ambulatory Visit: Payer: Self-pay | Admitting: Interventional Cardiology

## 2020-09-15 ENCOUNTER — Telehealth: Payer: Self-pay | Admitting: Interventional Cardiology

## 2020-09-15 ENCOUNTER — Other Ambulatory Visit (INDEPENDENT_AMBULATORY_CARE_PROVIDER_SITE_OTHER): Payer: Medicare Other

## 2020-09-15 DIAGNOSIS — Z794 Long term (current) use of insulin: Secondary | ICD-10-CM | POA: Diagnosis not present

## 2020-09-15 DIAGNOSIS — E1165 Type 2 diabetes mellitus with hyperglycemia: Secondary | ICD-10-CM | POA: Diagnosis not present

## 2020-09-15 LAB — BASIC METABOLIC PANEL
BUN: 79 mg/dL — ABNORMAL HIGH (ref 6–23)
CO2: 27 mEq/L (ref 19–32)
Calcium: 9.1 mg/dL (ref 8.4–10.5)
Chloride: 96 mEq/L (ref 96–112)
Creatinine, Ser: 2.12 mg/dL — ABNORMAL HIGH (ref 0.40–1.50)
GFR: 28.06 mL/min — ABNORMAL LOW (ref >60.00)
Glucose, Bld: 162 mg/dL — ABNORMAL HIGH (ref 70–99)
Potassium: 3.2 mEq/L — ABNORMAL LOW (ref 3.5–5.1)
Sodium: 134 mEq/L — ABNORMAL LOW (ref 135–145)

## 2020-09-15 LAB — HEMOGLOBIN A1C: Hgb A1c MFr Bld: 8.2 % — ABNORMAL HIGH (ref 4.6–6.5)

## 2020-09-15 NOTE — Telephone Encounter (Signed)
  Pt c/o Shortness Of Breath: STAT if SOB developed within the last 24 hours or pt is noticeably SOB on the phone  1. Are you currently SOB (can you hear that pt is SOB on the phone)? No   2. How long have you been experiencing SOB? For months while laying down   3. Are you SOB when sitting or when up moving around? both  4. Are you currently experiencing any other symptoms? Per wife, states that her husband has no energy, not even to walk. Has not checked his BP recently and has a pace maker so his HR has been good. She states that day by day he is getting weaker and weaker. No chest pain but does get indigestion and some dizziness.

## 2020-09-15 NOTE — Telephone Encounter (Signed)
Pt had a fall on 12/26 and went to ER.  Wife states he is healing fine from that.  Multiple nodules noted on thyroid on CT.  Had [place] today.  Has progressively had a decline in condition.  Feels very weak.  Gets SOB with lying down.  Is using a walker to get around due to weakness and unsteady gait.  Having episodes of dizziness.  Was previously taking Furosemide 80 bid but has only taken 40mg  bid for the last two days.  Wife states swelling is much better than it was when he was last seen. Denies CP.  Pt went to Dr. Ronnie Derby office to get labs drawn today (A1C, BMET and Microalbumin).  These are still pending.  Wife denies pt having cough, fever, body aches or chills. Will send to Dr. Tamala Julian for review and advisement.

## 2020-09-15 NOTE — Telephone Encounter (Signed)
Prescription refill request for Eliquis received.   HX: Afib Last office visit: 08/12/2020, Tamala Julian Scr: 2.14, 08/12/2020 Age: 87 yo Weight: 104.8 kg   Prescription refill sent.

## 2020-09-16 LAB — MICROALBUMIN / CREATININE URINE RATIO
Creatinine,U: 50.6 mg/dL
Microalb Creat Ratio: 8.9 mg/g (ref 0.0–30.0)
Microalb, Ur: 4.5 mg/dL — ABNORMAL HIGH (ref 0.0–1.9)

## 2020-09-17 NOTE — Telephone Encounter (Signed)
Left message for patient's wife that I was calling just to check in. Advised that I had no new advice from Dr. Tamala Julian but just wanted to check in to see how the patient is doing today.

## 2020-09-18 ENCOUNTER — Ambulatory Visit (INDEPENDENT_AMBULATORY_CARE_PROVIDER_SITE_OTHER): Payer: Medicare Other | Admitting: Endocrinology

## 2020-09-18 ENCOUNTER — Other Ambulatory Visit: Payer: Self-pay

## 2020-09-18 ENCOUNTER — Encounter: Payer: Self-pay | Admitting: Endocrinology

## 2020-09-18 ENCOUNTER — Other Ambulatory Visit: Payer: Self-pay | Admitting: Interventional Cardiology

## 2020-09-18 VITALS — BP 120/78 | HR 64 | Temp 97.6°F | Resp 16 | Ht 67.0 in | Wt 232.0 lb

## 2020-09-18 DIAGNOSIS — D508 Other iron deficiency anemias: Secondary | ICD-10-CM

## 2020-09-18 DIAGNOSIS — Z794 Long term (current) use of insulin: Secondary | ICD-10-CM

## 2020-09-18 DIAGNOSIS — N1832 Chronic kidney disease, stage 3b: Secondary | ICD-10-CM

## 2020-09-18 DIAGNOSIS — E1165 Type 2 diabetes mellitus with hyperglycemia: Secondary | ICD-10-CM | POA: Diagnosis not present

## 2020-09-18 MED ORDER — DAPAGLIFLOZIN PROPANEDIOL 5 MG PO TABS: 5.0000 mg | ORAL_TABLET | Freq: Every day | ORAL | 3 refills | Status: DC

## 2020-09-18 MED ORDER — POTASSIUM CHLORIDE CRYS ER 20 MEQ PO TBCR: 20.0000 meq | EXTENDED_RELEASE_TABLET | Freq: Two times a day (BID) | ORAL | 0 refills | Status: DC

## 2020-09-18 NOTE — Patient Instructions (Signed)
Take Humalog at all meals More testing after meals  Call if Sugars or BP get low  Stop Glimeperide with [place]

## 2020-09-18 NOTE — Telephone Encounter (Signed)
Is he feeling better on lower dose lasix? Is he regaining the weight?

## 2020-09-18 NOTE — Progress Notes (Signed)
Patient ID: Todd Romero, male   DOB: 1934/09/02, 87 y.o.   MRN: [id number]    Reason for Appointment:   Follow-up of various problems    History of Present Illness   Diagnosis: Type 2 DIABETES MELITUS, date of diagnosis:  1997   He has been on various regimens for his diabetes and has been on bedtime insulin with NPH since 2005 He also has benefited from adding Victoza in 2011 with better postprandial control and some weight loss Previously his weight has been as much as 247 pounds  On Victoza  since 11/14 with further improvement in blood sugar control. Since about 2015 his blood sugars have been overall mildly high with A1c over 7% consistently.  RECENT HISTORY:  His A1c is 8.2, previously 8.5  Non-insulin hypoglycemic drugs: Amaryl 2 mg am., Victoza 1.8 mg daily        Side effects from medications: None  Insulin regimen: NPH 24 at bedtime, 8-10 units at breakfast and lunch. Humalog 12-16 units with suppertime    Current management, blood sugar patterns and problems identified:  He was advised to start using the freestyle libre but he has done so as he is unsure about how to start this, he did not bring this today  Again he checks blood sugars fasting and after dinner mostly  HUMALOG: He was told to take this with every meal but he is only taking it sporadically at breakfast and lunch according to his wife  He says he does not take Humalog in the morning when he has oatmeal and will not take it at lunchtime consistently even though he will usually have some carbohydrate at lunch  Highest blood sugar in the afternoon 273  Also periodically will have high readings after dinner as before  This is despite adjusting his Humalog up to 16 units for larger meals or more carbohydrate  Fasting readings are fairly good  Diet is generally well-balanced and healthy  Monitors blood glucose:about 2x a day.    Glucometer:  Accu-Chek         Blood Glucose readings  from meter download:    PRE-MEAL Fasting Lunch Dinner Bedtime Overall  Glucose range:  90-207   273, 268    Mean/median:  144     171   POST-MEAL PC Breakfast PC Lunch PC Dinner  Glucose range:    115-272  Mean/median:    188   Previously:  PRE-MEAL Fasting Lunch Dinner Bedtime Overall  Glucose range: 92-164      Mean/median: 120    144   POST-MEAL PC Breakfast PC Lunch PC Dinner  Glucose range:   115-274  Mean/median:   170      Meal times: Dinner about 5-6 PM  Wt Readings from Last 3 Encounters:  09/18/20 232 lb (105.2 kg)  09/09/20 231 lb (104.8 kg)  08/12/20 232 lb (105.2 kg)   LABS: Lab Results  Component Value Date   HGBA1C 8.2 (H) 09/15/2020   HGBA1C 8.5 (H) 05/30/2020   HGBA1C 8.1 (H) 12/05/2019   Lab Results  Component Value Date   MICROALBUR 4.5 (H) 09/15/2020   LDLCALC 48 02/13/2019   CREATININE 2.12 (H) 09/15/2020       OTHER active problems are discussed in review of systems    Allergies as of 09/18/2020      Reactions   Bactrim [sulfamethoxazole-trimethoprim] Nausea And Vomiting, Other (See Comments)   Bleeding and ulcers   Morphine And Related Nausea And  Vomiting      Medication List       Accurate as of September 18, 2020 10:46 AM. If you have any questions, ask your nurse or doctor.        Accu-Chek Aviva Plus test strip Generic drug: glucose blood USE AS INSTRUCTED TO CHECK BLOOD SUGAR TWICE A DAY DX:E11.65   acetaminophen 500 MG tablet Commonly known as: TYLENOL Take 500 mg by mouth every 4 (four) hours as needed for moderate pain or headache.   amiodarone 200 MG tablet Commonly known as: PACERONE TAKE 1/2 TABLET BY MOUTH DAILY   apixaban 2.5 MG Tabs tablet Commonly known as: Eliquis Take 1 tablet (2.5 mg total) by mouth 2 (two) times daily.   B-D UF III MINI PEN NEEDLES 31G X 5 MM Misc Generic drug: Insulin Pen Needle USE 2 PEN NEEDLE PER DAY WITH HUMALOG AND VICTOZA   clonazePAM 0.5 MG tablet Commonly known as:  KLONOPIN Take 1-2 tablets (0.5-1 mg total) by mouth at bedtime.   ferrous sulfate 325 (65 FE) MG tablet Take 325 mg by mouth 2 (two) times daily with a meal.   furosemide 40 MG tablet Commonly known as: LASIX Take 2 tablets (80mg  total) by mouth every morning.  Take one tablet by mouth every evening.   glimepiride 2 MG tablet Commonly known as: AMARYL TAKE 1 TABLET DAILY BEFORE BREAKFAST   insulin lispro 100 UNIT/ML KwikPen Commonly known as: HumaLOG KwikPen Inject 12 Units into the skin 2 (two) times daily before lunch and supper. Inject 12 units under skin before lunch and dinner. May also inject 4 unit at breakfast is sugar reading is over 100. What changed:   how much to take  when to take this  additional instructions   insulin NPH Human 100 UNIT/ML injection Commonly known as: HumuLIN N Inject 26 units of Humulin N under the skin once daily at bedtime.   Insulin Syringe-Needle U-100 31G X 5/16" 1 ML Misc Commonly known as: BD Insulin Syringe U/F Use to inject insulin daily. DX:E11.9   metolazone 5 MG tablet Commonly known as: ZAROXOLYN TAKEN ONE TABLET BY MOUTH ON MONDAY AND THURSDAY. TAKE 30 MINUTES PRIOR TO FUROSEMIDE.   multivitamin with minerals Tabs tablet Take 1 tablet by mouth daily. Mens One a day Vit   pantoprazole 40 MG tablet Commonly known as: PROTONIX Take 1 tablet (40 mg total) by mouth 2 (two) times daily.   pravastatin 80 MG tablet Commonly known as: PRAVACHOL Take 1 tablet (80 mg total) by mouth every evening.   PRESERVISION AREDS 2 PO Take 1 capsule by mouth 2 (two) times daily.   spironolactone 25 MG tablet Commonly known as: ALDACTONE Take 0.5 tablets (12.5 mg total) by mouth every Monday, Wednesday, and Friday.   tamsulosin 0.4 MG Caps capsule Commonly known as: FLOMAX Take 1 capsule daily   Victoza 18 MG/3ML Sopn Generic drug: liraglutide Inject 1.8 mg daily   Vitamin D 50 MCG (2000 UT) tablet Take 2,000 Units by mouth  daily.       Allergies:  Allergies  Allergen Reactions  . Bactrim [Sulfamethoxazole-Trimethoprim] Nausea And Vomiting and Other (See Comments)    Bleeding and ulcers  . Morphine And Related Nausea And Vomiting    Past Medical History:  Diagnosis Date  . AKI (acute kidney injury) ([place]) 10/24/2019  . Anemia   . Arthritis   . CAD (coronary artery disease)    a. cardiac cath 12/2016 showing moderate AS, elevated LVEDP, heavy 3V  coronary calcification, 100% mD2, 30-50% LAD, 50-90% stenosis of Cx proximal to origin of L-PDA, RCA not engaged due to poor catheter control (difficult procedure) - coronary status was essentially unchanged from prior.  . Chicken pox   . Chronic diastolic CHF (congestive heart failure) ([place])   . Colon polyps   . Degenerative arthritis   . Diabetes ([place])   . Dyspnea   . Dyspnea on exertion 02/11/2015  . Essential hypertension   . Heart disease   . Heme positive stool 11/16/2017  . Hyperlipidemia   . New onset a-fib ([place]) 03/04/2018  . Obesity (BMI 30-39.9) 06/18/2015  . OSA (obstructive sleep apnea)    Severe with AHI 27/hr now on CPAP  not compliant with treatment.  . Peritonitis ([place]) 1985?  [name] Pneumonia    "6 months - 87 years old"  . Pure hypercholesterolemia   . RBBB   . [facility] spotted fever   . 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 Severe aortic stenosis    a. s/p TAVR 03/2017.  . Type II or unspecified type diabetes mellitus without mention of complication, not stated as uncontrolled   . Varicose vein of leg     Past Surgical History:  Procedure Laterality Date  . APPENDECTOMY  1985   peritonitis  . CARDIOVERSION N/A 04/03/2018   Procedure: CARDIOVERSION;  Surgeon: Josue Hector, MD;  Location: [facility] ENDOSCOPY;  Service: Cardiovascular;  Laterality: N/A;  . CARDIOVERSION N/A 01/18/2019   Procedure:  CARDIOVERSION;  Surgeon: Skeet Latch, MD;  Location: [place];  Service: Cardiovascular;  Laterality: N/A;  . CARPAL TUNNEL RELEASE    . COLONOSCOPY WITH PROPOFOL N/A 11/18/2017   Procedure: COLONOSCOPY WITH PROPOFOL;  Surgeon: Carol Ada, MD;  Location: [facility] ENDOSCOPY;  Service: Endoscopy;  Laterality: N/A;  . ESOPHAGOGASTRODUODENOSCOPY N/A 11/18/2017   Procedure: ESOPHAGOGASTRODUODENOSCOPY (EGD);  Surgeon: Carol Ada, MD;  Location: [facility];  Service: Endoscopy;  Laterality: N/A;  . EYE SURGERY Bilateral    cataracts removed, lens placed  . JOINT REPLACEMENT    . KNEE CARTILAGE SURGERY    . PACEMAKER IMPLANT N/A 10/25/2019   Procedure: PACEMAKER IMPLANT;  Surgeon: Evans Lance, MD;  Location: [facility] CV LAB;  Service: Cardiovascular;  Laterality: N/A;  . PARTIAL COLECTOMY    . RIGHT/LEFT HEART CATH AND CORONARY ANGIOGRAPHY N/A 12/28/2016   Procedure: Right/Left Heart Cath and Coronary Angiography;  Surgeon: Belva Crome, MD;  Location: [place] CV LAB;  Service: Cardiovascular;  Laterality: N/A;  . TEE WITHOUT CARDIOVERSION N/A 03/15/2017   Procedure: TRANSESOPHAGEAL ECHOCARDIOGRAM (TEE);  Surgeon: Burnell Blanks, MD;  Location: [facility];  Service: Open Heart Surgery;  Laterality: N/A;  . TONSILLECTOMY AND ADENOIDECTOMY  1942  . TOTAL KNEE ARTHROPLASTY Right 11/08/2017  . TOTAL KNEE ARTHROPLASTY Right 11/08/2017   Procedure: TOTAL KNEE ARTHROPLASTY;  Surgeon: Melrose Nakayama, MD;  Location: [facility];  Service: Orthopedics;  Laterality: Right;  . TRANSCATHETER AORTIC VALVE REPLACEMENT, TRANSFEMORAL N/A 03/15/2017   Procedure: TRANSCATHETER AORTIC VALVE REPLACEMENT, TRANSFEMORAL;  Surgeon: Burnell Blanks, MD;  Location: [facility];  Service: Open Heart Surgery;  Laterality: N/A;  . VEIN SURGERY  1980    Family History  Problem Relation Age of Onset  . Heart failure Father   . Emphysema Father   . COPD Father   . Early death Father   . Heart attack Father    .  Hypertension Mother   . Early death Mother   . Hyperlipidemia Mother   . Stroke Mother   . Cancer Sister   . Hypertension Sister   . Healthy Sister     Social History:  reports that he has never smoked. He has never used smokeless tobacco. He reports that he does not drink alcohol and does not use drugs.  Review of Systems:    HYPERTENSION:   Currently not on any specific antihypertensives except diuretics Followed by cardiologist   BP 118/68 at home  BP Readings from Last 3 Encounters:  09/18/20 (!) 154/86  09/09/20 130/76  08/31/20 140/72   Renal dysfunction: Not associated with microalbuminuria  Creatinine history:  Lab Results  Component Value Date   CREATININE 2.12 (H) 09/15/2020   CREATININE 2.14 (H) 08/12/2020   CREATININE 1.90 (H) 05/30/2020   Also has hypokalemia now  Lab Results  Component Value Date   K 3.2 (L) 09/15/2020    CARDIAC history:   Has some shortness of breath on exertion  Taking BiDil for CHF, followed regularly by cardiology His cardiologist has recommended Farxiga Currently on 80 mg twice daily of Lasix as well as has low-dose Zaroxolyn and Aldactone  HYPERLIPIDEMIA: The lipid abnormality consists of elevated LDL and he is taking 80 mg Pravachol,  LDL is variable, but does not have known CAD Labs as follows:   Lab Results  Component Value Date   CHOL 170 05/30/2020   HDL 46.10 05/30/2020   LDLCALC 48 02/13/2019   LDLDIRECT 98.0 05/30/2020   TRIG 208.0 (H) 05/30/2020   CHOLHDL 4 05/30/2020     History of anemia:  CBC Latest Ref Rng & Units 01/30/2020 11/30/2019 10/26/2019  WBC 3.4 - 10.8 x10E3/uL 7.9 7.7 7.8  Hemoglobin 13.0 - 17.7 g/dL 11.8(L) 11.3(L) 10.4(L)  Hematocrit 37.5 - 51.0 % 35.1(L) 34.6(L) 31.4(L)  Platelets 150 - 450 x10E3/uL 143(L) 151 134(L)    THYROID nodules: He had a CT scan of his neck and with the finding of thyroid nodules he had an ultrasound showing the following  1.  Heterogeneous, enlarged  and multinodular thyroid gland most consistent with multinodular goiter. 2. A 2.3 cm TI-RADS category 4 nodule (labeled # 4) in the left superior gland meets criteria to consider fine-needle aspiration Biopsy.  Lab Results  Component Value Date   TSH 0.757 08/12/2020     ROS    Physical Exam    BP (!) 154/86 (BP Location: Left Arm, Patient Position: Sitting, Cuff Size: Small)   Pulse 64   Temp 97.6 F (36.4 C) (Oral)   Resp 16   Ht 5\' 7"  (1.702 m)   Wt 232 lb (105.2 kg)   SpO2 97%   BMI 36.34 kg/m   Standing blood pressure 120/78  Thyroid not clearly palpable, may have mild thyromegaly on the right side on swallowing. Left side not palpable    ASSESSMENT/ PLAN:   Diabetes type 2 with obesity on insulin  See history of present illness for detailed discussion of his current management, blood sugar patterns and problems identified  His A1c is 8.2  He is on basal bolus insulin and Victoza along with Amaryl Postprandial readings are still frequently high and since he is not monitoring after breakfast and lunch not clear how often they are increased Compliance with Humalog is inconsistent and he is still not comfortable taking insulin with every meal and only with dinnertime on a regular basis  Recommendations:  Continue 24 units of  NPH  Farxiga 5 mg daily  Stop Amaryl  Taking consistently with every meal and even as little as 4 units for having soup at lunch  Take Humalog with breakfast even if eating oatmeal  May need to take up to 18 to 20 units at dinnertime if eating large meals  He will let us know if he needs any help starting the freestyle libre and discussed benefits of using this especially with adjusting his insulin for various meals   HYPOKALEMIA: This is from increased Lasix He will take 40 mg potassium daily for now and likely needs repeat testing in the next 1 to 2 weeks from cardiology Also consider increasing spironolactone  Weakness: May  be from hypokalemia, does not appear to be orthostatic today in the office He is likely having some issues with his balance and possibly orthostatic low blood pressure He needs to follow-up with his cardiologist and PCP  MULTINODULAR goiter: This is only a recent finding on his ultrasound He has a 2.3 cm TR 4 nodule but this is part of a multinodular goiter and likely longstanding and benign Clinically not palpable  Considering his age, multiple medical problems, small likelihood of malignancy as well as need to stop Eliquis for doing a biopsy would prefer to hold off on any intervention at this time Also discussed that if he does need thyroidectomy this would be difficult procedure considering his cardiac status at this time Explained to him that most thyroid cancers are slow-growing and unlikely to cause significant morbidity, best course may be to follow-up ultrasound in 6 months     There are no Patient Instructions on file for this visit.   [name] 09/18/2020, 10:46 AM

## 2020-09-19 NOTE — Telephone Encounter (Signed)
Left message to call back  

## 2020-09-23 NOTE — Telephone Encounter (Signed)
Left message to call back  

## 2020-09-25 ENCOUNTER — Other Ambulatory Visit: Payer: Self-pay | Admitting: Endocrinology

## 2020-09-25 NOTE — Telephone Encounter (Signed)
Left message to call back  

## 2020-09-25 NOTE — Telephone Encounter (Signed)
Patient's wife returning call. 

## 2020-09-25 NOTE — Telephone Encounter (Signed)
Not much else we can do safely. If continues to gain weight and/or becomes more dyspneic, will need hospital.

## 2020-09-25 NOTE — Telephone Encounter (Signed)
Wife states pt is still very weak, using a walker.  Gets lightheaded when standing.  Takes a few seconds and then he is able to go  BPs have been dropping, 107-113/62-69, HRs 70s-80s.  Currently taking Furosemide 80AM/40PM, Spironolactone 12.5MWF, Metolazone 5mg  Mon and Thurs.  Weight has trended up slightly since changing Furosemide to 80/40 dose.  He changed to this about a week ago and weight has gone from 221lbs to 224lbs.  Dr. Dwyane Dee started pt on Farxiga on 1/13 but he did not tolerate it (made him feel miserable and disoriented) so he stopped it 3 days ago.  Since stopping Wilder Glade he is more alert.  Dr. Dwyane Dee checked labs and K+ was noted to be 3.2 so he gave him KDUR 18meq to take BID.  Gave 15 day supply.  Pt scheduled for repeat labs there on 2/16.  Advised I will send to Dr. Tamala Julian for review and advisement.

## 2020-09-25 NOTE — Telephone Encounter (Signed)
Spoke with wife and made her aware of recommendations.  Wife verbalized understanding and was appreciative for call.

## 2020-09-29 ENCOUNTER — Telehealth: Payer: Self-pay

## 2020-09-29 ENCOUNTER — Telehealth: Payer: Self-pay | Admitting: Endocrinology

## 2020-09-29 MED ORDER — PRAVASTATIN SODIUM 80 MG PO TABS: 80.0000 mg | ORAL_TABLET | Freq: Every evening | ORAL | 0 refills | Status: DC

## 2020-09-29 NOTE — Telephone Encounter (Signed)
Patient refill request  pravastatin (PRAVACHOL) 80 MG tablet [[id number]]   [place], [place]

## 2020-09-29 NOTE — Telephone Encounter (Signed)
Rx sent for 30 D/S

## 2020-09-29 NOTE — Telephone Encounter (Signed)
P 

## 2020-09-29 NOTE — Telephone Encounter (Signed)
Noted.  Instead of glimepiride we need to have him use glipizide which is similar, to take 5 mg before breakfast.  This is less likely to accumulate with kidneys not functioning well.  Also remind him to check some blood sugars after breakfast and lunch

## 2020-09-29 NOTE — Telephone Encounter (Signed)
LVM for pt to schedule appt. F/U [facility]

## 2020-09-29 NOTE — Telephone Encounter (Signed)
Patient called to let Dr. Dwyane Dee know that due to complications from [place], Patient states he is back on Glimepiride instead of Farxiga.

## 2020-09-29 NOTE — Telephone Encounter (Signed)
Patient's wife  is saying that he was extremely nauseas while taking [place], he took it for 3 days and was very sick the whole time.  He wanted to go back on the glimepiride since he's never had a problem taking that.   She also states he has been retaining a lot of fluid and they have increased his furosemide and he may have to go into the hospital to remove the fluid.

## 2020-09-29 NOTE — Telephone Encounter (Signed)
Need to know specifically what complications he is referring to

## 2020-09-30 ENCOUNTER — Telehealth: Payer: Self-pay | Admitting: Interventional Cardiology

## 2020-09-30 NOTE — Telephone Encounter (Signed)
Follow Up:     Wife calling back, did not realized she had missed Carlyle's call from this morning. She said please call asap.

## 2020-09-30 NOTE — Telephone Encounter (Signed)
Patient's wife calling back. °

## 2020-09-30 NOTE — Telephone Encounter (Signed)
See phone note from 1/10 for follow up

## 2020-09-30 NOTE — Telephone Encounter (Signed)
The patient is continuing to gain weight from 228 to 230 pounds today and continues to be short of breath. The wife states that he sleeps all the time and he will fall asleep sitting up. Gave the patients wife recommendations from Dr. Tamala Julian on 09/25/20 that he will need to go to the hospital. Advised to go to the ER. Wife verbalized understanding.   Patients wife said she will try and convince her husband to go but in the past Dr. Tamala Julian has gotten him admitted without going through the ER and her husband will want to wait for Dr. Tamala Julian to get him in.

## 2020-09-30 NOTE — Telephone Encounter (Signed)
Left message for patient to call back  

## 2020-10-01 ENCOUNTER — Encounter (HOSPITAL_COMMUNITY): Payer: Self-pay | Admitting: Emergency Medicine

## 2020-10-01 ENCOUNTER — Inpatient Hospital Stay (HOSPITAL_COMMUNITY)
Admission: EM | Admit: 2020-10-01 | Discharge: 2020-10-14 | DRG: 291 | Disposition: A | Discharge status: Skilled Nursing Facility | Payer: Medicare Other | Attending: Cardiology | Admitting: Cardiology

## 2020-10-01 ENCOUNTER — Inpatient Hospital Stay
Admission: AD | Admit: 2020-10-01 | Payer: Medicare Other | Source: Ambulatory Visit | Admitting: Interventional Cardiology

## 2020-10-01 ENCOUNTER — Emergency Department (HOSPITAL_COMMUNITY): Payer: Medicare Other

## 2020-10-01 ENCOUNTER — Other Ambulatory Visit: Payer: Self-pay

## 2020-10-01 DIAGNOSIS — Z953 Presence of xenogenic heart valve: Secondary | ICD-10-CM

## 2020-10-01 DIAGNOSIS — Z7902 Long term (current) use of antithrombotics/antiplatelets: Secondary | ICD-10-CM

## 2020-10-01 DIAGNOSIS — Z794 Long term (current) use of insulin: Secondary | ICD-10-CM

## 2020-10-01 DIAGNOSIS — E871 Hypo-osmolality and hyponatremia: Secondary | ICD-10-CM | POA: Diagnosis present

## 2020-10-01 DIAGNOSIS — I495 Sick sinus syndrome: Secondary | ICD-10-CM | POA: Diagnosis present

## 2020-10-01 DIAGNOSIS — I5033 Acute on chronic diastolic (congestive) heart failure: Secondary | ICD-10-CM | POA: Diagnosis present

## 2020-10-01 DIAGNOSIS — E669 Obesity, unspecified: Secondary | ICD-10-CM | POA: Diagnosis present

## 2020-10-01 DIAGNOSIS — E876 Hypokalemia: Secondary | ICD-10-CM | POA: Diagnosis present

## 2020-10-01 DIAGNOSIS — I4819 Other persistent atrial fibrillation: Secondary | ICD-10-CM | POA: Diagnosis present

## 2020-10-01 DIAGNOSIS — E1136 Type 2 diabetes mellitus with diabetic cataract: Secondary | ICD-10-CM | POA: Diagnosis present

## 2020-10-01 DIAGNOSIS — I420 Dilated cardiomyopathy: Secondary | ICD-10-CM

## 2020-10-01 DIAGNOSIS — Z961 Presence of intraocular lens: Secondary | ICD-10-CM | POA: Diagnosis present

## 2020-10-01 DIAGNOSIS — N390 Urinary tract infection, site not specified: Secondary | ICD-10-CM | POA: Diagnosis present

## 2020-10-01 DIAGNOSIS — G4733 Obstructive sleep apnea (adult) (pediatric): Secondary | ICD-10-CM | POA: Diagnosis present

## 2020-10-01 DIAGNOSIS — E7800 Pure hypercholesterolemia, unspecified: Secondary | ICD-10-CM | POA: Diagnosis present

## 2020-10-01 DIAGNOSIS — N184 Chronic kidney disease, stage 4 (severe): Secondary | ICD-10-CM | POA: Diagnosis present

## 2020-10-01 DIAGNOSIS — Z825 Family history of asthma and other chronic lower respiratory diseases: Secondary | ICD-10-CM

## 2020-10-01 DIAGNOSIS — N179 Acute kidney failure, unspecified: Secondary | ICD-10-CM | POA: Diagnosis not present

## 2020-10-01 DIAGNOSIS — L8990 Pressure ulcer of unspecified site, unspecified stage: Secondary | ICD-10-CM | POA: Insufficient documentation

## 2020-10-01 DIAGNOSIS — Z20822 Contact with and (suspected) exposure to covid-19: Secondary | ICD-10-CM | POA: Diagnosis present

## 2020-10-01 DIAGNOSIS — I4891 Unspecified atrial fibrillation: Secondary | ICD-10-CM | POA: Diagnosis not present

## 2020-10-01 DIAGNOSIS — Z885 Allergy status to narcotic agent status: Secondary | ICD-10-CM

## 2020-10-01 DIAGNOSIS — I2583 Coronary atherosclerosis due to lipid rich plaque: Secondary | ICD-10-CM

## 2020-10-01 DIAGNOSIS — E1122 Type 2 diabetes mellitus with diabetic chronic kidney disease: Secondary | ICD-10-CM | POA: Diagnosis present

## 2020-10-01 DIAGNOSIS — I272 Pulmonary hypertension, unspecified: Secondary | ICD-10-CM | POA: Diagnosis present

## 2020-10-01 DIAGNOSIS — D72829 Elevated white blood cell count, unspecified: Secondary | ICD-10-CM

## 2020-10-01 DIAGNOSIS — F05 Delirium due to known physiological condition: Secondary | ICD-10-CM | POA: Diagnosis not present

## 2020-10-01 DIAGNOSIS — Z96651 Presence of right artificial knee joint: Secondary | ICD-10-CM | POA: Diagnosis present

## 2020-10-01 DIAGNOSIS — I480 Paroxysmal atrial fibrillation: Secondary | ICD-10-CM

## 2020-10-01 DIAGNOSIS — R001 Bradycardia, unspecified: Secondary | ICD-10-CM | POA: Diagnosis present

## 2020-10-01 DIAGNOSIS — Z881 Allergy status to other antibiotic agents status: Secondary | ICD-10-CM

## 2020-10-01 DIAGNOSIS — Z9119 Patient's noncompliance with other medical treatment and regimen: Secondary | ICD-10-CM

## 2020-10-01 DIAGNOSIS — I10 Essential (primary) hypertension: Secondary | ICD-10-CM

## 2020-10-01 DIAGNOSIS — E785 Hyperlipidemia, unspecified: Secondary | ICD-10-CM | POA: Diagnosis present

## 2020-10-01 DIAGNOSIS — I350 Nonrheumatic aortic (valve) stenosis: Secondary | ICD-10-CM | POA: Diagnosis not present

## 2020-10-01 DIAGNOSIS — Z823 Family history of stroke: Secondary | ICD-10-CM

## 2020-10-01 DIAGNOSIS — Z9841 Cataract extraction status, right eye: Secondary | ICD-10-CM

## 2020-10-01 DIAGNOSIS — R0602 Shortness of breath: Secondary | ICD-10-CM | POA: Diagnosis present

## 2020-10-01 DIAGNOSIS — I2510 Atherosclerotic heart disease of native coronary artery without angina pectoris: Secondary | ICD-10-CM | POA: Diagnosis present

## 2020-10-01 DIAGNOSIS — Z950 Presence of cardiac pacemaker: Secondary | ICD-10-CM

## 2020-10-01 DIAGNOSIS — Z7984 Long term (current) use of oral hypoglycemic drugs: Secondary | ICD-10-CM

## 2020-10-01 DIAGNOSIS — Z79899 Other long term (current) drug therapy: Secondary | ICD-10-CM

## 2020-10-01 DIAGNOSIS — N170 Acute kidney failure with tubular necrosis: Secondary | ICD-10-CM | POA: Diagnosis present

## 2020-10-01 DIAGNOSIS — I5043 Acute on chronic combined systolic (congestive) and diastolic (congestive) heart failure: Principal | ICD-10-CM | POA: Diagnosis present

## 2020-10-01 DIAGNOSIS — Z95828 Presence of other vascular implants and grafts: Secondary | ICD-10-CM

## 2020-10-01 DIAGNOSIS — Z8249 Family history of ischemic heart disease and other diseases of the circulatory system: Secondary | ICD-10-CM

## 2020-10-01 DIAGNOSIS — Z9842 Cataract extraction status, left eye: Secondary | ICD-10-CM

## 2020-10-01 DIAGNOSIS — Z888 Allergy status to other drugs, medicaments and biological substances status: Secondary | ICD-10-CM

## 2020-10-01 LAB — BASIC METABOLIC PANEL
Anion gap: 14 (ref 5–15)
BUN: 65 mg/dL — ABNORMAL HIGH (ref 8–23)
CO2: 23 mmol/L (ref 22–32)
Calcium: 8.9 mg/dL (ref 8.9–10.3)
Chloride: 97 mmol/L — ABNORMAL LOW (ref 98–111)
Creatinine, Ser: 2.24 mg/dL — ABNORMAL HIGH (ref 0.61–1.24)
GFR, Estimated: 28 mL/min — ABNORMAL LOW (ref >60)
Glucose, Bld: 269 mg/dL — ABNORMAL HIGH (ref 70–99)
Potassium: 4 mmol/L (ref 3.5–5.1)
Sodium: 134 mmol/L — ABNORMAL LOW (ref 135–145)

## 2020-10-01 LAB — CBG MONITORING, ED: Glucose-Capillary: 253 mg/dL — ABNORMAL HIGH (ref 70–99)

## 2020-10-01 LAB — HEPATIC FUNCTION PANEL
ALT: 20 U/L (ref 0–44)
AST: 25 U/L (ref 15–41)
Albumin: 3.3 g/dL — ABNORMAL LOW (ref 3.5–5.0)
Alkaline Phosphatase: 66 U/L (ref 38–126)
Bilirubin, Direct: 0.2 mg/dL (ref 0.0–0.2)
Indirect Bilirubin: 1 mg/dL — ABNORMAL HIGH (ref 0.3–0.9)
Total Bilirubin: 1.2 mg/dL (ref 0.3–1.2)
Total Protein: 6.3 g/dL — ABNORMAL LOW (ref 6.5–8.1)

## 2020-10-01 LAB — CBC
HCT: 33.6 % — ABNORMAL LOW (ref 39.0–52.0)
Hemoglobin: 10.7 g/dL — ABNORMAL LOW (ref 13.0–17.0)
MCH: 29.3 pg (ref 26.0–34.0)
MCHC: 31.8 g/dL (ref 30.0–36.0)
MCV: 92.1 fL (ref 80.0–100.0)
Platelets: 152 10*3/uL (ref 150–400)
RBC: 3.65 MIL/uL — ABNORMAL LOW (ref 4.22–5.81)
RDW: 15.8 % — ABNORMAL HIGH (ref 11.5–15.5)
WBC: 8.8 10*3/uL (ref 4.0–10.5)
nRBC: 0 % (ref 0.0–0.2)

## 2020-10-01 LAB — TSH: TSH: 0.783 u[IU]/mL (ref 0.350–4.500)

## 2020-10-01 LAB — MAGNESIUM: Magnesium: 1.9 mg/dL (ref 1.7–2.4)

## 2020-10-01 LAB — SARS CORONAVIRUS 2 BY RT PCR (HOSPITAL ORDER, PERFORMED IN ~~LOC~~ HOSPITAL LAB): SARS Coronavirus 2: NEGATIVE

## 2020-10-01 LAB — BRAIN NATRIURETIC PEPTIDE: B Natriuretic Peptide: 433.6 pg/mL — ABNORMAL HIGH (ref 0.0–100.0)

## 2020-10-01 MED ORDER — FUROSEMIDE 10 MG/ML IJ SOLN
80.0000 mg | Freq: Two times a day (BID) | INTRAMUSCULAR | Status: DC
  Administered 2020-10-01 – 2020-10-02 (×2): 80 mg via INTRAVENOUS
  Filled 2020-10-01 (×2): qty 8

## 2020-10-01 MED ORDER — INSULIN ASPART 100 UNIT/ML ~~LOC~~ SOLN
0.0000 [IU] | Freq: Three times a day (TID) | SUBCUTANEOUS | Status: DC
  Administered 2020-10-02: 8 [IU] via SUBCUTANEOUS
  Administered 2020-10-02: 3 [IU] via SUBCUTANEOUS
  Administered 2020-10-02: 5 [IU] via SUBCUTANEOUS
  Administered 2020-10-03: 8 [IU] via SUBCUTANEOUS
  Administered 2020-10-03: 3 [IU] via SUBCUTANEOUS
  Administered 2020-10-04 (×2): 5 [IU] via SUBCUTANEOUS
  Administered 2020-10-04: 3 [IU] via SUBCUTANEOUS
  Administered 2020-10-05: 5 [IU] via SUBCUTANEOUS
  Administered 2020-10-05: 2 [IU] via SUBCUTANEOUS
  Administered 2020-10-05 – 2020-10-06 (×2): 11 [IU] via SUBCUTANEOUS
  Administered 2020-10-06: 8 [IU] via SUBCUTANEOUS
  Administered 2020-10-06: 5 [IU] via SUBCUTANEOUS
  Administered 2020-10-07: 8 [IU] via SUBCUTANEOUS
  Administered 2020-10-07: 11 [IU] via SUBCUTANEOUS
  Administered 2020-10-08 (×2): 8 [IU] via SUBCUTANEOUS
  Administered 2020-10-08: 11 [IU] via SUBCUTANEOUS
  Administered 2020-10-09: 8 [IU] via SUBCUTANEOUS
  Administered 2020-10-09: 3 [IU] via SUBCUTANEOUS
  Administered 2020-10-09: 8 [IU] via SUBCUTANEOUS
  Administered 2020-10-10: 5 [IU] via SUBCUTANEOUS
  Administered 2020-10-10: 8 [IU] via SUBCUTANEOUS
  Administered 2020-10-10: 3 [IU] via SUBCUTANEOUS
  Administered 2020-10-11: 5 [IU] via SUBCUTANEOUS
  Administered 2020-10-11: 2 [IU] via SUBCUTANEOUS
  Administered 2020-10-12 – 2020-10-13 (×3): 3 [IU] via SUBCUTANEOUS
  Administered 2020-10-14: 2 [IU] via SUBCUTANEOUS

## 2020-10-01 MED ORDER — INSULIN ASPART 100 UNIT/ML ~~LOC~~ SOLN
0.0000 [IU] | Freq: Every day | SUBCUTANEOUS | Status: DC
  Administered 2020-10-01: 3 [IU] via SUBCUTANEOUS
  Administered 2020-10-02 – 2020-10-04 (×3): 2 [IU] via SUBCUTANEOUS
  Administered 2020-10-05 – 2020-10-08 (×4): 4 [IU] via SUBCUTANEOUS
  Administered 2020-10-09: 3 [IU] via SUBCUTANEOUS
  Administered 2020-10-10: 2 [IU] via SUBCUTANEOUS

## 2020-10-01 MED ORDER — FUROSEMIDE 10 MG/ML IJ SOLN: 40.0000 mg | Freq: Once | INTRAMUSCULAR | Status: DC

## 2020-10-01 NOTE — Telephone Encounter (Signed)
[name] is calling back due to not receiving a callback in regards to this. She is requesting a callback from [place]. Please advise.

## 2020-10-01 NOTE — Telephone Encounter (Signed)
Spoke with wife and advised I am waiting for Dr. Tamala Julian to review note.  She states pt was miserable all night.  He couldn't get comfortable and was restless all night.  Had to get up several times and move around.  Spoke with Dr. Tamala Julian and he said to call and see about getting pt admitted.  Dr. Tamala Julian advised pt should not sit at home, uncomfortable and having issues for days waiting to be admitted.  He advised pt go to ER if he can't get in soon.

## 2020-10-01 NOTE — Telephone Encounter (Signed)
Called hospital and scheduled direct admit.  Called wife to let her know and advised that we don't know when they will be able to get him in.  Advised if he gets any worse or he just can't tolerate anymore, to go ahead and go to ER.  Wife verbalized understanding and was appreciative for call.

## 2020-10-01 NOTE — H&P (Addendum)
Cardiology Admission History and Physical:   Patient ID: Todd Romero MRN: [id number]; DOB: 10-04-1933   Admission date: 10/01/2020  Primary Care Provider: Ma Hillock, DO [facility] Cardiologist: Sinclair Grooms, MD  [facility] Electrophysiologist:  None   Chief Complaint:  SOB   Patient Profile:   Todd Romero is a 87 y.o. male with hx of severe AS, treated with TAVR 03/2017, chronic diastoic HF, CAD with CTO of D1 and 90% LCX, OSA not on therapy, HTN, DM-2 HLD, PAF, and DCCV on amiodarone to NSR and resolution of CHF, subsequent significant brady and AV conduction abnormality on amio leading to [facility] 10/2019.        History of Present Illness:   Todd Romero with above hx and last seen in the office 08/12/20.  His diuretic was increased at that time for several days.  He continued on amiodarone and he has stage IV CKD with Cr 1.9.   Pt's wife had called office with pt having increasing SOB. He was restless all night.   We attempted direct admit but pt was sick and no beds.  He has been taking 80 mg lasix BID. Weight continues to climb.  He cannot rest due to SOB.  He can walk to kitchen from bedroom and has to stop with SOB.   Na 134, K+ 4.0 glucose 269 Cr 2.24  Hgb 10.7 WBC 8.8 plts 152  Recent A1C 8.2   2 V CXR.  IMPRESSION: Cardiomegaly with mild pulmonary vascular congestion and trace bilateral pleural effusions. EKG:  The ECG that has not yet been done   Pt saw Dr. Dwyane Dee  and was placed on Farxiga and amaryl was stopped. But with nausea pt stopped farxiga and resumed amaryl.  MULTINODULAR goiter: This is only a recent finding on his ultrasound He has a 2.3 cm TR 4 nodule but this is part of a multinodular goiter and likely longstanding and benign Clinically not palpable  BP 132/60 P 68 R 17 T 98.4  Pt with no chest pain, + edema and SOB and despite increasing diuretics wt is now 230 lbs up from 220 lbs on home scales.   Past Medical History:   Diagnosis Date  . AKI (acute kidney injury) ([place]) 10/24/2019  . Anemia   . Arthritis   . CAD (coronary artery disease)    a. cardiac cath 12/2016 showing moderate AS, elevated LVEDP, heavy 3V coronary calcification, 100% mD2, 30-50% LAD, 50-90% stenosis of Cx proximal to origin of L-PDA, RCA not engaged due to poor catheter control (difficult procedure) - coronary status was essentially unchanged from prior.  . Chicken pox   . Chronic diastolic CHF (congestive heart failure) ([place])   . Colon polyps   . Degenerative arthritis   . Diabetes ([place])   . Dyspnea   . Dyspnea on exertion 02/11/2015  . Essential hypertension   . Heart disease   . Heme positive stool 11/16/2017  . Hyperlipidemia   . New onset a-fib ([place]) 03/04/2018  . Obesity (BMI 30-39.9) 06/18/2015  . OSA (obstructive sleep apnea)    Severe with AHI 27/hr now on CPAP  not compliant with treatment.  . Peritonitis ([place]) 1985?  [name] Pneumonia    "6 months - 87 years old"  . Pure hypercholesterolemia   . RBBB   . [facility] spotted fever   . 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 Severe aortic stenosis    a. s/p TAVR 03/2017.  . Type II or unspecified type diabetes mellitus without mention of complication, not stated as uncontrolled   . Varicose vein of leg     Past Surgical History:  Procedure Laterality Date  . APPENDECTOMY  1985   peritonitis  . CARDIOVERSION N/A 04/03/2018   Procedure: CARDIOVERSION;  Surgeon: Josue Hector, MD;  Location: [facility] ENDOSCOPY;  Service: Cardiovascular;  Laterality: N/A;  . CARDIOVERSION N/A 01/18/2019   Procedure: CARDIOVERSION;  Surgeon: Skeet Latch, MD;  Location: [place];  Service: Cardiovascular;  Laterality: N/A;  . CARPAL TUNNEL RELEASE    . COLONOSCOPY WITH PROPOFOL N/A 11/18/2017   Procedure: COLONOSCOPY WITH PROPOFOL;  Surgeon: Carol Ada, MD;   Location: [facility] ENDOSCOPY;  Service: Endoscopy;  Laterality: N/A;  . ESOPHAGOGASTRODUODENOSCOPY N/A 11/18/2017   Procedure: ESOPHAGOGASTRODUODENOSCOPY (EGD);  Surgeon: Carol Ada, MD;  Location: [facility] ENDOSCOPY;  Service: Endoscopy;  Laterality: N/A;  . EYE SURGERY Bilateral    cataracts removed, lens placed  . JOINT REPLACEMENT    . KNEE CARTILAGE SURGERY    . PACEMAKER IMPLANT N/A 10/25/2019   Procedure: PACEMAKER IMPLANT;  Surgeon: Evans Lance, MD;  Location: [place] CV LAB;  Service: Cardiovascular;  Laterality: N/A;  . PARTIAL COLECTOMY    . RIGHT/LEFT HEART CATH AND CORONARY ANGIOGRAPHY N/A 12/28/2016   Procedure: Right/Left Heart Cath and Coronary Angiography;  Surgeon: Belva Crome, MD;  Location: [place] CV LAB;  Service: Cardiovascular;  Laterality: N/A;  . TEE WITHOUT CARDIOVERSION N/A 03/15/2017   Procedure: TRANSESOPHAGEAL ECHOCARDIOGRAM (TEE);  Surgeon: Burnell Blanks, MD;  Location: [place];  Service: Open Heart Surgery;  Laterality: N/A;  . TONSILLECTOMY AND ADENOIDECTOMY  1942  . TOTAL KNEE ARTHROPLASTY Right 11/08/2017  . TOTAL KNEE ARTHROPLASTY Right 11/08/2017   Procedure: TOTAL KNEE ARTHROPLASTY;  Surgeon: Melrose Nakayama, MD;  Location: [place];  Service: Orthopedics;  Laterality: Right;  . TRANSCATHETER AORTIC VALVE REPLACEMENT, TRANSFEMORAL N/A 03/15/2017   Procedure: TRANSCATHETER AORTIC VALVE REPLACEMENT, TRANSFEMORAL;  Surgeon: Burnell Blanks, MD;  Location: [place];  Service: Open Heart Surgery;  Laterality: N/A;  . VEIN SURGERY  1980     Medications Prior to Admission: Prior to Admission medications   Medication Sig Start Date End Date Taking? Authorizing Provider  ACCU-CHEK AVIVA PLUS test strip USE AS INSTRUCTED TO CHECK BLOOD SUGAR TWICE A DAY DX:E11.65 06/01/20   [name], MD  acetaminophen (TYLENOL) 500 MG tablet Take 500 mg by mouth every 4 (four) hours as needed for moderate pain or headache.    [provider]  amiodarone  (PACERONE) 200 MG tablet TAKE 1/2 TABLET BY MOUTH DAILY 06/25/20   Allred, Jeneen Rinks, MD  apixaban (ELIQUIS) 2.5 MG TABS tablet Take 1 tablet (2.5 mg total) by mouth 2 (two) times daily. 09/15/20   Belva Crome, MD  B-D UF III MINI PEN NEEDLES 31G X 5 MM MISC USE 2 PEN NEEDLE PER DAY WITH HUMALOG AND VICTOZA 06/24/20   [name], MD  Cholecalciferol (VITAMIN D) 50 MCG (2000 UT) tablet Take 2,000 Units by mouth daily.    [provider]  dapagliflozin propanediol (FARXIGA) 5 MG TABS tablet Take 1 tablet (5 mg total) by mouth daily. 09/18/20   [name], MD  ferrous sulfate 325 (65 FE) MG tablet Take 325 mg by mouth 2 (two) times daily with a meal.     [provider]  furosemide (  LASIX) 40 MG tablet Take 2 tablets (80mg  total) by mouth every morning.  Take one tablet by mouth every evening. 08/19/20   Belva Crome, MD  glimepiride (AMARYL) 2 MG tablet TAKE 1 TABLET DAILY BEFORE BREAKFAST 07/03/20   [name], MD  insulin lispro (HUMALOG KWIKPEN) 100 UNIT/ML KwikPen Inject 12 Units into the skin 2 (two) times daily before lunch and supper. Inject 12 units under skin before lunch and dinner. May also inject 4 unit at breakfast is sugar reading is over 100. Patient taking differently: Inject 8-16 Units into the skin 3 (three) times daily. Inject 8 units for breakfast then Take 8-16 units as needed for under skin before lunch and dinner. May also inject 4 unit at breakfast is sugar reading is over 100. 05/28/20   [name], MD  insulin NPH Human (HUMULIN N) 100 UNIT/ML injection Inject 26 units of Humulin N under the skin once daily at bedtime. 01/28/20   [name], MD  Insulin Syringe-Needle U-100 (BD INSULIN SYRINGE U/F) 31G X 5/16" 1 ML MISC Use to inject insulin daily. DX:E11.9 04/18/20   [name], MD  KLOR-CON M20 20 MEQ tablet TAKE 1 TABLET BY MOUTH TWICE A DAY 09/25/20   [name], MD  liraglutide (VICTOZA) 18 MG/3ML SOPN Inject 1.8 mg daily 09/09/20   [name], MD   metolazone (ZAROXOLYN) 5 MG tablet TAKEN ONE TABLET BY MOUTH ON MONDAY AND THURSDAY. TAKE 30 MINUTES PRIOR TO FUROSEMIDE. 05/07/20   Belva Crome, MD  Multiple Vitamin (MULTIVITAMIN WITH MINERALS) TABS tablet Take 1 tablet by mouth daily. Mens One a day Vit    [provider]  Multiple Vitamins-Minerals (PRESERVISION AREDS 2 PO) Take 1 capsule by mouth 2 (two) times daily.    [provider]  pantoprazole (PROTONIX) 40 MG tablet Take 1 tablet (40 mg total) by mouth 2 (two) times daily. 11/19/19   Belva Crome, MD  pravastatin (PRAVACHOL) 80 MG tablet Take 1 tablet (80 mg total) by mouth every evening. 09/29/20   Raoul Pitch, Renee A, DO  spironolactone (ALDACTONE) 25 MG tablet Take 0.5 tablets (12.5 mg total) by mouth every Monday, Wednesday, and Friday. 12/05/19   Belva Crome, MD  tamsulosin [facility]) 0.4 MG CAPS capsule Take 1 capsule daily 06/30/20   Kuneff, Renee A, DO     Allergies:    Allergies  Allergen Reactions  . Bactrim [Sulfamethoxazole-Trimethoprim] Nausea And Vomiting and Other (See Comments)    Bleeding and ulcers  . Morphine And Related Nausea And Vomiting    Social History:   Social History   Socioeconomic History  . Marital status: Married    Spouse name: Not on file  . Number of children: 4  . Years of education: Not on file  . Highest education level: Not on file  Occupational History  . Occupation: Construction-Retired  Tobacco Use  . Smoking status: Never Smoker  . Smokeless tobacco: Never Used  Vaping Use  . Vaping Use: Never used  Substance and Sexual Activity  . Alcohol use: No  . Drug use: No  . Sexual activity: Not Currently    Partners: Female  Other Topics Concern  . 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, retired Mining engineer:      -smoke alarm in the home:Yes     - wears seatbelt: Yes     - Feels safe in their relationships: Yes  Social  Determinants of Health   Financial Resource Strain: Not on file  Food Insecurity: Not on file  Transportation Needs: Not on file  Physical Activity: Not on file  Stress: Not on file  Social Connections: Not on file  Intimate Partner Violence: Not on file    Family History:   The patient's family history includes COPD in his father; Cancer in his sister; Early death in his father and mother; Emphysema in his father; Healthy in his sister; Heart attack in his father; Heart failure in his father; Hyperlipidemia in his mother; Hypertension in his mother and sister; Stroke in his mother.    ROS:  Please see the history of present illness.  General:no colds or fevers, + weight increase Skin:no rashes or ulcers HEENT:no blurred vision, no congestion CV:see HPI PUL:see HPI GI:no diarrhea constipation or melena, no indigestion GU:no hematuria, no dysuria MS:no joint pain, no claudication Neuro:no syncope, no lightheadedness Endo:+ diabetes see HPI, no thyroid disease + nodules, not palpated  All other ROS reviewed and negative.     Physical Exam/Data:   Vitals:   10/01/20 1406  BP: 132/60  Pulse: 68  Resp: 17  Temp: 98.4 F (36.9 C)  TempSrc: Oral  SpO2: 100%   No intake or output data in the 24 hours ending 10/01/20 1626 Last 3 Weights 09/18/2020 09/09/2020 08/12/2020  Weight (lbs) 232 lb 231 lb 232 lb  Weight (kg) 105.235 kg 104.781 kg 105.235 kg     There is no height or weight on file to calculate BMI.  General:  Well nourished, well developed, in no acute distress sitting up in bed.  When walks from bedroom to kitchen he is very winded at home HEENT: normal Lymph: no adenopathy Neck: ++ JVD Endocrine:  No thryomegaly Vascular: No carotid bruits; pedal pulses 1+ bilaterally  Cardiac:  normal S1, S2; RRR; no murmur gallup rub or click Lungs:  diminished to auscultation bilaterally, no wheezing, rhonchi or rales  Abd: soft, nontender, no hepatomegaly larger than usual +  BS Ext: 3-4+ edema of lower ext Musculoskeletal:  No deformities, BUE and BLE strength normal and equal Skin: warm and dry  Neuro:  Alert and oriented X 3 MAE follows commands, no focal abnormalities noted Psych:  Normal affect  Wife in room with pt    Relevant CV Studies: Echo 09/13/19 IMPRESSIONS    1. Left ventricular ejection fraction, by visual estimation, is 35 to  40%. The left ventricle has moderately decreased function. There is  moderately increased left ventricular hypertrophy.  2. Elevated left atrial pressure.  3. Left ventricular diastolic parameters are consistent with Grade II  diastolic dysfunction (pseudonormalization).  4. Global right ventricle has normal systolic function.The right  ventricular size is normal. No increase in right ventricular wall  thickness.  5. Left atrial size was moderately dilated.  6. Right atrial size was normal.  7. Moderate mitral annular calcification.  8. The mitral valve is abnormal. No evidence of mitral valve  regurgitation.  9. The tricuspid valve is normal in structure.  10. Bioprosthetic AVR. Aortic valve regurgitation is not visualized. Mean  gradient 9 mmHg, stable from prior TTE on 03/05/18  11. The pulmonic valve was not well visualized. Pulmonic valve  regurgitation is not visualized.  12. There is mild dilatation of the ascending aorta measuring 36 mm.  13. Mildly elevated pulmonary artery systolic pressure.  14. The tricuspid regurgitant velocity is 2.67 m/s, and with an assumed  right atrial pressure of  8 mmHg, the estimated right ventricular systolic  pressure is mildly elevated at 36.5 mmHg.  15. The inferior vena cava is dilated in size with >50% respiratory  variability, suggesting right atrial pressure of 8 mmHg.   FINDINGS  Left Ventricle: Left ventricular ejection fraction, by visual estimation,  is 35 to 40%. The left ventricle has moderately decreased function. The  left ventricle demonstrates  global hypokinesis. There is moderately  increased left ventricular hypertrophy.  Left ventricular diastolic parameters are consistent with Grade II  diastolic dysfunction (pseudonormalization). Elevated left atrial  pressure.   Right Ventricle: The right ventricular size is normal. No increase in  right ventricular wall thickness. Global RV systolic function is has  normal systolic function. The tricuspid regurgitant velocity is 2.67 m/s,  and with an assumed right atrial pressure  of 8 mmHg, the estimated right ventricular systolic pressure is mildly  elevated at 36.5 mmHg.   Left Atrium: Left atrial size was moderately dilated.   Right Atrium: Right atrial size was normal in size   Pericardium: There is no evidence of pericardial effusion.   Mitral Valve: The mitral valve is abnormal. Moderate mitral annular  calcification. No evidence of mitral valve regurgitation.   Tricuspid Valve: The tricuspid valve is normal in structure. Tricuspid  valve regurgitation is trivial.   Aortic Valve: The aortic valve has been repaired/replaced. Aortic valve  regurgitation is not visualized. Aortic valve mean gradient measures 9.0  mmHg. Aortic valve peak gradient measures 16.9 mmHg. Aortic valve area, by  VTI measures 1.28 cm.   Pulmonic Valve: The pulmonic valve was not well visualized. Pulmonic valve  regurgitation is not visualized. Pulmonic regurgitation is not visualized.   Aorta: Aortic dilatation noted. There is mild dilatation of the ascending  aorta measuring 36 mm.   Venous: The inferior vena cava is dilated in size with greater than 50%  respiratory variability, suggesting right atrial pressure of 8 mmHg.   IAS/Shunts: The interatrial septum was not well visualized.     Cardiac cath 12/28/16  Difficult procedure due to severe angulation in the right innominate/ascending aortic region that produced catheter resistance to torque and advancement.  Moderate aortic stenosis  with a transvalvular gradient of 29 mmHg (mean) and 33-38 mmHg (peak to peak). Calculated aortic valve area 1.35 cm.  Elevated left ventricular end-diastolic pressure and pulmonary capillary wedge pressure. Known LV systolic function with EF greater than 50% by recent echo suggesting chronic diastolic left heart failure.  Heavy three-vessel coronary calcification.  100% stenosis in the mid body of the second diagonal. There is diffuse 30-50% narrowing in the LAD.  The circumflex coronary is dominant. Moderate to heavy calcification is noted throughout the distal vessel and in the proximal segment. There is eccentric 50-90% stenosis proximal to the origin of the L-PDA.  Nondominant right coronary. Not selectively engaged due to poor catheter control. The vessel is patent and supplies only the right ventricle.  RECOMMENDATIONS:   Coronary status is essentially unchanged compared to 5 years ago.  Moderate aortic stenosis based upon hemodynamics obtained from this case. This matches the recent echo.  Explanation for exertional fatigue is uncertain. Consider referral to the valve clinic for a second opinion concerning management of aortic stenosis. May need dobutamine echo as he is potentially a low gradient severe aortic stenosis substrate.   Laboratory Data:  High Sensitivity Troponin:  No results for input(s): TROPONINIHS in the last 720 hours.    Chemistry Recent Labs  Lab 10/01/20 1422  NA 134*  K 4.0  CL 97*  CO2 23  GLUCOSE 269*  BUN 65*  CREATININE 2.24*  CALCIUM 8.9  GFRNONAA 28*  ANIONGAP 14    No results for input(s): PROT, ALBUMIN, AST, ALT, ALKPHOS, BILITOT in the last 168 hours. Hematology Recent Labs  Lab 10/01/20 1422  WBC 8.8  RBC 3.65*  HGB 10.7*  HCT 33.6*  MCV 92.1  MCH 29.3  MCHC 31.8  RDW 15.8*  PLT 152   BNPNo results for input(s): BNP, PROBNP in the last 168 hours.  DDimer No results for input(s): DDIMER in the last 168  hours.   Radiology/Studies:  DG Chest 2 View  Result Date: 10/01/2020 CLINICAL DATA:  Acute shortness of breath. EXAM: CHEST - 2 VIEW COMPARISON:  10/26/2019 and prior studies FINDINGS: Cardiomegaly, aortic valve replacement and mild pulmonary vascular congestion noted. A LEFT pacemaker is again noted. Trace bilateral pleural effusions are noted. No airspace disease, pneumothorax or acute bony abnormality. IMPRESSION: Cardiomegaly with mild pulmonary vascular congestion and trace bilateral pleural effusions. Electronically Signed   By: Margarette Canada M.D.   On: 10/01/2020 14:36    THYROID nodules: He had a CT scan of his neck and with the finding of thyroid nodules he had an ultrasound showing the following  1.  Heterogeneous, enlarged and multinodular thyroid gland most consistent with multinodular goiter. 2. A 2.3 cm TI-RADS category 4 nodule (labeled # 4) in the left superior gland meets criteria to consider fine-needle aspiration Biopsy.  Recent Labs     Assessment and Plan:   1. Acute on chronic combined systolic and diastolic CHF with last EF 35%, on home lasix has been on 80 mg BID  Will place on lasix 80 mg BID IV for now. strict I&O, check mg+ and TSH.  Check EKG. Check for COVID though no colds or fevers.  Keep K+ at 4.0 and Mg+ 2.0 follow BMP, will add imdur and hydralazine prior to discharge once has diuresed and change lasix to torsemide. 2. Hx AS with TAVR 2018 and last echo 1/21 with stable function. Will recheck echo 3. Hx of significant Bradycardia with PPM (MDT) placed 10/25/19  last interrogated 07/2020 leads and battery stable, histograms appropriate. Not on BB will hold off for now. 4. PAF on amiodarone 100 mg daily and has maintained SR  On eliquis 2.5 bid with CKD-4. Continue for PAF and VTE prophylaxis   5. IDDM just saw Dr. Dwyane Dee and placed on farxiga. But pt did not tolerate due to nausea. So stopped  SSI and diabetic nurse will be consulted. Most likely will need  meal time coverage which he does at home. 6. CKD-4 no ACE ARB and will hold spironolactone.  Cr is higher than his usual. Diuresis should improve   Risk Assessment/Risk Scores:        New York Heart Association (NYHA) Functional Class NYHA Class III     Severity of Illness: The appropriate patient status for this patient is INPATIENT. Inpatient status is judged to be reasonable and necessary in order to provide the required intensity of service to ensure the patient's safety. The patient's presenting symptoms, physical exam findings, and initial radiographic and laboratory data in the context of their chronic comorbidities is felt to place them at high risk for further clinical deterioration. Furthermore, it is not anticipated that the patient will be medically stable for discharge from the hospital within 2 midnights of admission. The following factors support the patient status of inpatient.   "  The patient's presenting symptoms include acute SOB with activity, weakness, difficulty sleeping due to SOB . " The worrisome physical exam findings include extreme edema and SOB. " The initial radiographic and laboratory data are worrisome because of waiting on labs . " The chronic co-morbidities include CAD, chronic diastolic CHF PAF.   * I certify that at the point of admission it is my clinical judgment that the patient will require inpatient hospital care spanning beyond 2 midnights from the point of admission due to high intensity of service, high risk for further deterioration and high frequency of surveillance required.*    For questions or updates, please contact [place] Please consult [email or web address] for contact info under     Signed, Cecilie Kicks, NP  10/01/2020 4:26 PM

## 2020-10-01 NOTE — ED Triage Notes (Signed)
Patient sent to ED by PCP for admission for congestive heart failure, patient has been taking furosemide but states he feels like a pouch of water is growing in his abdomen. Patient has been taking 80mg  lasix twice per day and is still gaining weight.

## 2020-10-01 NOTE — ED Provider Notes (Signed)
[facility] EMERGENCY DEPARTMENT Provider Note   CSN: [id number] Arrival date & time: 10/01/20  1342     History Chief Complaint  Patient presents with  . Congestive Heart Failure    Todd Romero is a 87 y.o. male with a past medical history of CAD, CHF, DM 2, OSA, A. fib anticoagulated with Eliquis, severe aortic stenosis status post TAVR, obesity, who presents today for evaluation for CHF.  He reports that over the past week he has had worsening shortness of breath.  He is now currently sleeping in her recliner and still occasionally has difficulty breathing.  He states that during this time his weight has increased from 220 up to 230. He increased his Lasix, was on 40 mg twice a day, was then increased to 80/40, and about 5 days ago increased to 80 twice a day however he continues to gain weight.  He denies any fevers.  He is vaccinated and boosters against Covid.  He states that in the past when he has felt like this he has required admission for diuresis.   HPI     Past Medical History:  Diagnosis Date  . AKI (acute kidney injury) ([place]) 10/24/2019  . Anemia   . Arthritis   . CAD (coronary artery disease)    a. cardiac cath 12/2016 showing moderate AS, elevated LVEDP, heavy 3V coronary calcification, 100% mD2, 30-50% LAD, 50-90% stenosis of Cx proximal to origin of L-PDA, RCA not engaged due to poor catheter control (difficult procedure) - coronary status was essentially unchanged from prior.  . Chicken pox   . Chronic diastolic CHF (congestive heart failure) ([place])   . Colon polyps   . Degenerative arthritis   . Diabetes ([place])   . Dyspnea   . Dyspnea on exertion 02/11/2015  . Essential hypertension   . Heart disease   . Heme positive stool 11/16/2017  . Hyperlipidemia   . New onset a-fib ([place]) 03/04/2018  . Obesity (BMI 30-39.9) 06/18/2015  . OSA (obstructive sleep apnea)    Severe with AHI 27/hr now on CPAP  not compliant with treatment.  .  Peritonitis ([place]) 1985?  [name] Pneumonia    "6 months - 87 years old"  . Pure hypercholesterolemia   . RBBB   . [facility] spotted fever   . 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 Severe aortic stenosis    a. s/p TAVR 03/2017.  . Type II or unspecified type diabetes mellitus without mention of complication, not stated as uncontrolled   . Varicose vein of leg     Patient Active Problem List   Diagnosis Date Noted  . Thyroid nodule greater than or equal to 1 cm in diameter incidentally noted on imaging study 09/01/2020  . Spondylosis, cervical 09/01/2020  . Sleep disturbance 04/23/2020  . Tachycardia-bradycardia syndrome ([place]) 02/06/2020  . Pacemaker 11/08/2019  . Chronic anticoagulation 11/08/2019  . Symptomatic bradycardia 10/24/2019  . Benign prostatic hyperplasia 10/01/2019  . Stage 3b chronic kidney disease ([place]) 10/01/2019  . Secondary hypercoagulable state ([place]) 09/24/2019  . 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 Chronic combined systolic and diastolic CHF (congestive heart failure) ([place]) 03/27/2018  . Persistent atrial fibrillation ([place]) 03/04/2018  . GIB (gastrointestinal bleeding) 11/18/2017  . Anemia 11/16/2017  . Leukocytosis 11/16/2017  . 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 Posterior vitreous detachment, left 03/02/2016  .  Pseudophakia of both eyes 03/02/2016  . Aortic stenosis 12/18/2015  . Obesity (BMI 30-39.9) 06/18/2015  . Intermediate stage nonexudative age-related macular degeneration of both eyes 02/27/2015  . Obstructive sleep apnea 02/11/2015  . Primary osteoarthritis of right knee 03/18/2014  . Essential hypertension, benign 06/21/2013    Class: Chronic  . RBBB   . CAD (coronary artery disease)     Class: Chronic  . Hyperlipidemia   . Type 2 diabetes mellitus ([place]) 05/02/2013  . Retinal tear, left  11/16/2011    Past Surgical History:  Procedure Laterality Date  . APPENDECTOMY  1985   peritonitis  . CARDIOVERSION N/A 04/03/2018   Procedure: CARDIOVERSION;  Surgeon: Josue Hector, MD;  Location: [facility] ENDOSCOPY;  Service: Cardiovascular;  Laterality: N/A;  . CARDIOVERSION N/A 01/18/2019   Procedure: CARDIOVERSION;  Surgeon: Skeet Latch, MD;  Location: [facility];  Service: Cardiovascular;  Laterality: N/A;  . CARPAL TUNNEL RELEASE    . COLONOSCOPY WITH PROPOFOL N/A 11/18/2017   Procedure: COLONOSCOPY WITH PROPOFOL;  Surgeon: Carol Ada, MD;  Location: [facility] ENDOSCOPY;  Service: Endoscopy;  Laterality: N/A;  . ESOPHAGOGASTRODUODENOSCOPY N/A 11/18/2017   Procedure: ESOPHAGOGASTRODUODENOSCOPY (EGD);  Surgeon: Carol Ada, MD;  Location: [facility] ENDOSCOPY;  Service: Endoscopy;  Laterality: N/A;  . EYE SURGERY Bilateral    cataracts removed, lens placed  . JOINT REPLACEMENT    . KNEE CARTILAGE SURGERY    . PACEMAKER IMPLANT N/A 10/25/2019   Procedure: PACEMAKER IMPLANT;  Surgeon: Evans Lance, MD;  Location: [facility] CV LAB;  Service: Cardiovascular;  Laterality: N/A;  . PARTIAL COLECTOMY    . RIGHT/LEFT HEART CATH AND CORONARY ANGIOGRAPHY N/A 12/28/2016   Procedure: Right/Left Heart Cath and Coronary Angiography;  Surgeon: Belva Crome, MD;  Location: [place] CV LAB;  Service: Cardiovascular;  Laterality: N/A;  . TEE WITHOUT CARDIOVERSION N/A 03/15/2017   Procedure: TRANSESOPHAGEAL ECHOCARDIOGRAM (TEE);  Surgeon: Burnell Blanks, MD;  Location: [facility];  Service: Open Heart Surgery;  Laterality: N/A;  . TONSILLECTOMY AND ADENOIDECTOMY  1942  . TOTAL KNEE ARTHROPLASTY Right 11/08/2017  . TOTAL KNEE ARTHROPLASTY Right 11/08/2017   Procedure: TOTAL KNEE ARTHROPLASTY;  Surgeon: Melrose Nakayama, MD;  Location: [facility];  Service: Orthopedics;  Laterality: Right;  . TRANSCATHETER AORTIC VALVE REPLACEMENT, TRANSFEMORAL N/A 03/15/2017   Procedure: TRANSCATHETER AORTIC VALVE  REPLACEMENT, TRANSFEMORAL;  Surgeon: Burnell Blanks, MD;  Location: [place];  Service: Open Heart Surgery;  Laterality: N/A;  . VEIN SURGERY  1980       Family History  Problem Relation Age of Onset  . Heart failure Father   . Emphysema Father   . COPD Father   . Early death Father   . Heart attack Father   . Hypertension Mother   . Early death Mother   . Hyperlipidemia Mother   . Stroke Mother   . Cancer Sister   . Hypertension Sister   . Healthy Sister     Social History   Tobacco Use  . Smoking status: Never Smoker  . Smokeless tobacco: Never Used  Vaping Use  . Vaping Use: Never used  Substance Use Topics  . Alcohol use: No  . Drug use: No    Home Medications Prior to Admission medications   Medication Sig Start Date End Date Taking? Authorizing Provider  ACCU-CHEK AVIVA PLUS test strip USE AS INSTRUCTED TO CHECK BLOOD SUGAR TWICE A DAY DX:E11.65 06/01/20   [name], MD  acetaminophen (TYLENOL) 500 MG tablet Take 500  mg by mouth every 4 (four) hours as needed for moderate pain or headache.    [provider]  amiodarone (PACERONE) 200 MG tablet TAKE 1/2 TABLET BY MOUTH DAILY 06/25/20   Allred, Jeneen Rinks, MD  apixaban (ELIQUIS) 2.5 MG TABS tablet Take 1 tablet (2.5 mg total) by mouth 2 (two) times daily. 09/15/20   Belva Crome, MD  B-D UF III MINI PEN NEEDLES 31G X 5 MM MISC USE 2 PEN NEEDLE PER DAY WITH HUMALOG AND VICTOZA 06/24/20   [name], MD  Cholecalciferol (VITAMIN D) 50 MCG (2000 UT) tablet Take 2,000 Units by mouth daily.    [provider]  dapagliflozin propanediol (FARXIGA) 5 MG TABS tablet Take 1 tablet (5 mg total) by mouth daily. 09/18/20   [name], MD  ferrous sulfate 325 (65 FE) MG tablet Take 325 mg by mouth 2 (two) times daily with a meal.     [provider]  furosemide (LASIX) 40 MG tablet Take 2 tablets (80mg  total) by mouth every morning.  Take one tablet by mouth every evening. 08/19/20   Belva Crome,  MD  glimepiride (AMARYL) 2 MG tablet TAKE 1 TABLET DAILY BEFORE BREAKFAST 07/03/20   [name], MD  insulin lispro (HUMALOG KWIKPEN) 100 UNIT/ML KwikPen Inject 12 Units into the skin 2 (two) times daily before lunch and supper. Inject 12 units under skin before lunch and dinner. May also inject 4 unit at breakfast is sugar reading is over 100. Patient taking differently: Inject 8-16 Units into the skin 3 (three) times daily. Inject 8 units for breakfast then Take 8-16 units as needed for under skin before lunch and dinner. May also inject 4 unit at breakfast is sugar reading is over 100. 05/28/20   [name], MD  insulin NPH Human (HUMULIN N) 100 UNIT/ML injection Inject 26 units of Humulin N under the skin once daily at bedtime. 01/28/20   [name], MD  Insulin Syringe-Needle U-100 (BD INSULIN SYRINGE U/F) 31G X 5/16" 1 ML MISC Use to inject insulin daily. DX:E11.9 04/18/20   [name], MD  KLOR-CON M20 20 MEQ tablet TAKE 1 TABLET BY MOUTH TWICE A DAY 09/25/20   [name], MD  liraglutide (VICTOZA) 18 MG/3ML SOPN Inject 1.8 mg daily 09/09/20   [name], MD  metolazone (ZAROXOLYN) 5 MG tablet TAKEN ONE TABLET BY MOUTH ON MONDAY AND THURSDAY. TAKE 30 MINUTES PRIOR TO FUROSEMIDE. 05/07/20   Belva Crome, MD  Multiple Vitamin (MULTIVITAMIN WITH MINERALS) TABS tablet Take 1 tablet by mouth daily. Mens One a day Vit    [provider]  Multiple Vitamins-Minerals (PRESERVISION AREDS 2 PO) Take 1 capsule by mouth 2 (two) times daily.    [provider]  pantoprazole (PROTONIX) 40 MG tablet Take 1 tablet (40 mg total) by mouth 2 (two) times daily. 11/19/19   Belva Crome, MD  pravastatin (PRAVACHOL) 80 MG tablet Take 1 tablet (80 mg total) by mouth every evening. 09/29/20   Raoul Pitch, Renee A, DO  spironolactone (ALDACTONE) 25 MG tablet Take 0.5 tablets (12.5 mg total) by mouth every Monday, Wednesday, and Friday. 12/05/19   Belva Crome, MD  tamsulosin [facility]) 0.4 MG CAPS capsule Take  1 capsule daily 06/30/20   Kuneff, Renee A, DO    Allergies    Bactrim [sulfamethoxazole-trimethoprim] and Morphine and related  Review of Systems   Review of Systems  Constitutional: Positive for fatigue. Negative for chills and fever.  Respiratory: Positive for  shortness of breath.   Cardiovascular: Positive for leg swelling. Negative for chest pain and palpitations.  Gastrointestinal: Negative for abdominal pain, diarrhea, nausea and vomiting.  Neurological: Negative for weakness and headaches.  All other systems reviewed and are negative.   Physical Exam Updated Vital Signs BP 132/60 (BP Location: Right Arm)   Pulse 68   Temp 98.4 F (36.9 C) (Oral)   Resp 17   SpO2 100%   Physical Exam Vitals and nursing note reviewed.  Constitutional:      General: He is not in acute distress.    Appearance: He is not diaphoretic.  HENT:     Head: Normocephalic and atraumatic.  Eyes:     General: No scleral icterus.       Right eye: No discharge.        Left eye: No discharge.     Conjunctiva/sclera: Conjunctivae normal.  Cardiovascular:     Rate and Rhythm: Normal rate. Rhythm irregular.     Heart sounds: Normal heart sounds.  Pulmonary:     Breath sounds: No stridor.     Comments: Mild increased work of breathing Abdominal:     General: There is no distension.     Tenderness: There is no abdominal tenderness. There is no guarding.  Musculoskeletal:        General: No deformity.     Cervical back: Normal range of motion and neck supple.     Right lower leg: Edema present.     Left lower leg: Edema present.     Comments: Pitting edema 4+ bilaterally, however right leg is larger than left (chronically)  Skin:    General: Skin is warm and dry.  Neurological:     Mental Status: He is alert.     Motor: No abnormal muscle tone.  Psychiatric:        Behavior: Behavior normal.     ED Results / Procedures / Treatments   Labs (all labs ordered are listed, but only abnormal  results are displayed) Labs Reviewed  BASIC METABOLIC PANEL - Abnormal; Notable for the following components:      Result Value   Sodium 134 (*)    Chloride 97 (*)    Glucose, Bld 269 (*)    BUN 65 (*)    Creatinine, Ser 2.24 (*)    GFR, Estimated 28 (*)    All other components within normal limits  CBC - Abnormal; Notable for the following components:   RBC 3.65 (*)    Hemoglobin 10.7 (*)    HCT 33.6 (*)    RDW 15.8 (*)    All other components within normal limits  SARS CORONAVIRUS 2 BY RT PCR (HOSPITAL ORDER, [facility])  HEPATIC FUNCTION PANEL  BRAIN NATRIURETIC PEPTIDE  BRAIN NATRIURETIC PEPTIDE  MAGNESIUM  TSH    EKG None  Radiology DG Chest 2 View  Result Date: 10/01/2020 CLINICAL DATA:  Acute shortness of breath. EXAM: CHEST - 2 VIEW COMPARISON:  10/26/2019 and prior studies FINDINGS: Cardiomegaly, aortic valve replacement and mild pulmonary vascular congestion noted. A LEFT pacemaker is again noted. Trace bilateral pleural effusions are noted. No airspace disease, pneumothorax or acute bony abnormality. IMPRESSION: Cardiomegaly with mild pulmonary vascular congestion and trace bilateral pleural effusions. Electronically Signed   By: Margarette Canada M.D.   On: 10/01/2020 14:36    Procedures Procedures   Medications Ordered in ED Medications  furosemide (LASIX) injection 40 mg (has no administration in time range)  ED Course  I have reviewed the triage vital signs and the nursing notes.  Pertinent labs & imaging results that were available during my care of the patient were reviewed by me and considered in my medical decision making (see chart for details).  Clinical Course as of 10/01/20 1713  Wed Oct 01, 2020  1709 I personally saw this patient.  [JH]    Clinical Course User Index [JH] Luna Fuse, MD   MDM Rules/Calculators/A&P                         Patient is a 87 year old man who presents today for evaluation of  worsening shortness of breath and leg swelling in addition to abdominal swelling.   He has had a 10 pound weight gain over the past 1 to 2 weeks despite increasing his p.o. Lasix. On my exam he has mild increased respiratory effort with significant bilateral lower extremity pitting edema.    He is mildly anemic with a hemoglobin at 10.7.  BMP shows creatinine is at baseline at 2.24.  Glucose is elevated at 269. Hepatic function panel and BNP are ordered.  IV Lasix is ordered.  Cardiology will admit the patient.  The patient appears reasonably stabilized for admission considering the current resources, flow, and capabilities available in the ED at this time, and I doubt any other [facility] requiring further screening and/or treatment in the ED prior to admission assuming timely admission and bed placement.  Note: Portions of this report may have been transcribed using voice recognition software. Every effort was made to ensure accuracy; however, inadvertent computerized transcription errors may be present  Final Clinical Impression(s) / ED Diagnoses Final diagnoses:  Acute on chronic combined systolic and diastolic CHF (congestive heart failure) ([place])  Shortness of breath    Rx / DC Orders ED Discharge Orders    None       Ollen Gross 10/01/20 2125    Luna Fuse, MD 10/01/20 2214

## 2020-10-02 ENCOUNTER — Other Ambulatory Visit: Payer: Self-pay

## 2020-10-02 ENCOUNTER — Encounter (HOSPITAL_COMMUNITY): Payer: Self-pay | Admitting: Cardiology

## 2020-10-02 ENCOUNTER — Inpatient Hospital Stay (HOSPITAL_COMMUNITY): Payer: Medicare Other

## 2020-10-02 DIAGNOSIS — I4891 Unspecified atrial fibrillation: Secondary | ICD-10-CM

## 2020-10-02 DIAGNOSIS — I2510 Atherosclerotic heart disease of native coronary artery without angina pectoris: Secondary | ICD-10-CM | POA: Diagnosis not present

## 2020-10-02 DIAGNOSIS — N184 Chronic kidney disease, stage 4 (severe): Secondary | ICD-10-CM | POA: Diagnosis not present

## 2020-10-02 DIAGNOSIS — I420 Dilated cardiomyopathy: Secondary | ICD-10-CM | POA: Diagnosis not present

## 2020-10-02 DIAGNOSIS — I5033 Acute on chronic diastolic (congestive) heart failure: Secondary | ICD-10-CM | POA: Diagnosis not present

## 2020-10-02 DIAGNOSIS — I4819 Other persistent atrial fibrillation: Secondary | ICD-10-CM

## 2020-10-02 LAB — COMPREHENSIVE METABOLIC PANEL
ALT: 16 U/L (ref 0–44)
AST: 26 U/L (ref 15–41)
Albumin: 3.3 g/dL — ABNORMAL LOW (ref 3.5–5.0)
Alkaline Phosphatase: 62 U/L (ref 38–126)
Anion gap: 14 (ref 5–15)
BUN: 63 mg/dL — ABNORMAL HIGH (ref 8–23)
CO2: 24 mmol/L (ref 22–32)
Calcium: 8.9 mg/dL (ref 8.9–10.3)
Chloride: 97 mmol/L — ABNORMAL LOW (ref 98–111)
Creatinine, Ser: 2.06 mg/dL — ABNORMAL HIGH (ref 0.61–1.24)
GFR, Estimated: 31 mL/min — ABNORMAL LOW (ref >60)
Glucose, Bld: 171 mg/dL — ABNORMAL HIGH (ref 70–99)
Potassium: 3.6 mmol/L (ref 3.5–5.1)
Sodium: 135 mmol/L (ref 135–145)
Total Bilirubin: 1.4 mg/dL — ABNORMAL HIGH (ref 0.3–1.2)
Total Protein: 6.2 g/dL — ABNORMAL LOW (ref 6.5–8.1)

## 2020-10-02 LAB — GLUCOSE, CAPILLARY
Glucose-Capillary: 264 mg/dL — ABNORMAL HIGH (ref 70–99)
Glucose-Capillary: 269 mg/dL — ABNORMAL HIGH (ref 70–99)
Glucose-Capillary: 213 mg/dL — ABNORMAL HIGH (ref 70–99)
Glucose-Capillary: 208 mg/dL — ABNORMAL HIGH (ref 70–99)

## 2020-10-02 LAB — CBG MONITORING, ED
Glucose-Capillary: 173 mg/dL — ABNORMAL HIGH (ref 70–99)
Glucose-Capillary: 187 mg/dL — ABNORMAL HIGH (ref 70–99)

## 2020-10-02 MED ORDER — FERROUS SULFATE 325 (65 FE) MG PO TABS
325.0000 mg | ORAL_TABLET | Freq: Two times a day (BID) | ORAL | Status: DC
  Administered 2020-10-02 – 2020-10-14 (×24): 325 mg via ORAL
  Filled 2020-10-02 (×24): qty 1

## 2020-10-02 MED ORDER — ZOLPIDEM TARTRATE 5 MG PO TABS
5.0000 mg | ORAL_TABLET | Freq: Every evening | ORAL | Status: DC | PRN
  Administered 2020-10-03: 5 mg via ORAL
  Filled 2020-10-02: qty 1

## 2020-10-02 MED ORDER — ONDANSETRON HCL 4 MG/2ML IJ SOLN: 4.0000 mg | Freq: Four times a day (QID) | INTRAMUSCULAR | Status: DC | PRN

## 2020-10-02 MED ORDER — POTASSIUM CHLORIDE CRYS ER 20 MEQ PO TBCR
40.0000 meq | EXTENDED_RELEASE_TABLET | Freq: Two times a day (BID) | ORAL | Status: DC
  Administered 2020-10-02 – 2020-10-04 (×4): 40 meq via ORAL
  Filled 2020-10-02 (×4): qty 2

## 2020-10-02 MED ORDER — FUROSEMIDE 10 MG/ML IJ SOLN: 80.0000 mg | Freq: Three times a day (TID) | INTRAMUSCULAR | Status: DC

## 2020-10-02 MED ORDER — OXYCODONE-ACETAMINOPHEN 5-325 MG PO TABS
1.0000 | ORAL_TABLET | ORAL | Status: DC | PRN
  Administered 2020-10-02 – 2020-10-07 (×4): 1 via ORAL
  Filled 2020-10-02 (×4): qty 1

## 2020-10-02 MED ORDER — PANTOPRAZOLE SODIUM 40 MG PO TBEC
40.0000 mg | DELAYED_RELEASE_TABLET | Freq: Two times a day (BID) | ORAL | Status: DC
  Administered 2020-10-02 – 2020-10-14 (×25): 40 mg via ORAL
  Filled 2020-10-02 (×25): qty 1

## 2020-10-02 MED ORDER — ACETAMINOPHEN 325 MG PO TABS
650.0000 mg | ORAL_TABLET | ORAL | Status: DC | PRN
  Administered 2020-10-02 – 2020-10-14 (×13): 650 mg via ORAL
  Filled 2020-10-02 (×14): qty 2

## 2020-10-02 MED ORDER — METOLAZONE 5 MG PO TABS
2.5000 mg | ORAL_TABLET | Freq: Once | ORAL | Status: DC
  Filled 2020-10-02: qty 1

## 2020-10-02 MED ORDER — TAMSULOSIN HCL 0.4 MG PO CAPS
0.4000 mg | ORAL_CAPSULE | Freq: Every day | ORAL | Status: DC
  Administered 2020-10-02 – 2020-10-13 (×12): 0.4 mg via ORAL
  Filled 2020-10-02 (×12): qty 1

## 2020-10-02 MED ORDER — FUROSEMIDE 10 MG/ML IJ SOLN
12.0000 mg/h | INTRAVENOUS | Status: DC
  Administered 2020-10-02 – 2020-10-04 (×4): 12 mg/h via INTRAVENOUS
  Filled 2020-10-02 (×5): qty 20

## 2020-10-02 MED ORDER — SODIUM CHLORIDE 0.9% FLUSH
3.0000 mL | INTRAVENOUS | Status: DC | PRN
  Administered 2020-10-07: 3 mL via INTRAVENOUS

## 2020-10-02 MED ORDER — ISOSORBIDE MONONITRATE ER 30 MG PO TB24: 15.0000 mg | ORAL_TABLET | Freq: Every day | ORAL | Status: DC

## 2020-10-02 MED ORDER — HYDRALAZINE HCL 10 MG PO TABS: 10.0000 mg | ORAL_TABLET | Freq: Three times a day (TID) | ORAL | Status: DC

## 2020-10-02 MED ORDER — ACETAMINOPHEN 500 MG PO TABS: 500.0000 mg | ORAL_TABLET | ORAL | Status: DC | PRN

## 2020-10-02 MED ORDER — SODIUM CHLORIDE 0.9% FLUSH
3.0000 mL | Freq: Two times a day (BID) | INTRAVENOUS | Status: DC
  Administered 2020-10-02 – 2020-10-08 (×12): 3 mL via INTRAVENOUS

## 2020-10-02 MED ORDER — GERHARDT'S BUTT CREAM
TOPICAL_CREAM | Freq: Every day | CUTANEOUS | Status: DC
  Filled 2020-10-02: qty 1

## 2020-10-02 MED ORDER — NYSTATIN 100000 UNIT/GM EX POWD
Freq: Two times a day (BID) | CUTANEOUS | Status: DC
  Administered 2020-10-08: 1 via TOPICAL
  Filled 2020-10-02: qty 15

## 2020-10-02 MED ORDER — INSULIN NPH (HUMAN) (ISOPHANE) 100 UNIT/ML ~~LOC~~ SUSP
12.0000 [IU] | Freq: Every day | SUBCUTANEOUS | Status: DC
  Administered 2020-10-02 – 2020-10-05 (×4): 12 [IU] via SUBCUTANEOUS
  Filled 2020-10-02: qty 10

## 2020-10-02 MED ORDER — POTASSIUM CHLORIDE CRYS ER 20 MEQ PO TBCR
20.0000 meq | EXTENDED_RELEASE_TABLET | Freq: Two times a day (BID) | ORAL | Status: DC
  Administered 2020-10-02: 20 meq via ORAL
  Filled 2020-10-02: qty 1

## 2020-10-02 MED ORDER — ALPRAZOLAM 0.25 MG PO TABS
0.2500 mg | ORAL_TABLET | Freq: Once | ORAL | Status: AC
  Administered 2020-10-02: 0.25 mg via ORAL
  Filled 2020-10-02: qty 1

## 2020-10-02 MED ORDER — AMIODARONE HCL 200 MG PO TABS
100.0000 mg | ORAL_TABLET | Freq: Every day | ORAL | Status: DC
  Administered 2020-10-02 – 2020-10-03 (×2): 100 mg via ORAL
  Filled 2020-10-02 (×2): qty 1

## 2020-10-02 MED ORDER — PRAVASTATIN SODIUM 40 MG PO TABS
80.0000 mg | ORAL_TABLET | Freq: Every evening | ORAL | Status: DC
  Administered 2020-10-02 – 2020-10-13 (×12): 80 mg via ORAL
  Filled 2020-10-02 (×12): qty 2

## 2020-10-02 MED ORDER — METOLAZONE 5 MG PO TABS
5.0000 mg | ORAL_TABLET | Freq: Once | ORAL | Status: AC
  Administered 2020-10-02: 5 mg via ORAL
  Filled 2020-10-02: qty 1

## 2020-10-02 MED ORDER — APIXABAN 2.5 MG PO TABS
2.5000 mg | ORAL_TABLET | Freq: Two times a day (BID) | ORAL | Status: DC
  Administered 2020-10-02 – 2020-10-14 (×25): 2.5 mg via ORAL
  Filled 2020-10-02 (×27): qty 1

## 2020-10-02 MED ORDER — SODIUM CHLORIDE 0.9 % IV SOLN: 250.0000 mL | INTRAVENOUS | Status: DC | PRN

## 2020-10-02 NOTE — ED Notes (Signed)
Both the pt and his wife are asleep

## 2020-10-02 NOTE — Consult Note (Addendum)
Advanced Heart Failure Team Consult Note   Primary Physician: Ma Hillock, DO PCP-Cardiologist:  Sinclair Grooms, MD  EP: Dr. Lovena Le   Reason for Consultation: Acute on chronic combined systolic and diastolic heart failure  HPI:    Todd Romero is seen today for evaluation of acute on chronic combined systolic and diastolic heart failure at the request of Dr. Radford Pax, cardiology.  87 year old male, followed by Dr. Tamala Julian, with history of chronic combined systolic and diastolic heart failure, last echo 1/21 showed reduced EF 35 to 40% with moderate LVH.  RV normal.  Also history of severe aortic stenosis status post TAVR in 2018, symptomatic bradycardia/RBBB status post [employer] permanent pacemaker implanted 10/2019, CAD, PAF, insulin-dependent diabetes, stage IV CKD, severe OSA intolerant to CPAP, hypertension, as well as history of carpal tunnel status post bilateral carpal tunnel release.  Recently been struggling w/ NYHA Class IIIb symptoms + volume overload. Reports symptoms started shortly after placement of PPM.  Home diuretics increased w/ poor response. Symptoms progressed and he was referred to the ED on 1/26. Markedly fluid overloaded. BNP 433. CXR w/ cardiomegaly, mild pulmonary vascular congestion and trace bilateral pleural effusions. Serum creatinine 2.2 on admit, consistent with baseline. EKG V paced 65 bpm.   He was admitted by general cardiology and started on 80 IV lasix BID w/ poor response thus far, c/b borderline hypotension. Only 750 cc in UOP yesterday. SCr stable 2.06 today. Continues w/ orthopnea/ PND. Denies CP.    Of note, his PCP had recently tried him on Farxiga but he did not tolerate due to nausea.    Echo 2018 EF 55-60%.  Echo 2019 EF 43%. RV mildly dilated  Echo 09/2019 EF 35-40%, mod LVH, GIIDD, RV normal    Last Cath 12/2016   Difficult procedure due to severe angulation in the right innominate/ascending aortic region that produced  catheter resistance to torque and advancement.  Moderate aortic stenosis with a transvalvular gradient of 29 mmHg (mean) and 33-38 mmHg (peak to peak). Calculated aortic valve area 1.35 cm.  Elevated left ventricular end-diastolic pressure and pulmonary capillary wedge pressure. Known LV systolic function with EF greater than 50% by recent echo suggesting chronic diastolic left heart failure.  Heavy three-vessel coronary calcification.  100% stenosis in the mid body of the second diagonal. There is diffuse 30-50% narrowing in the LAD.  The circumflex coronary is dominant. Moderate to heavy calcification is noted throughout the distal vessel and in the proximal segment. There is eccentric 50-90% stenosis proximal to the origin of the L-PDA.  Nondominant right coronary. Not selectively engaged due to poor catheter control. The vessel is patent and supplies only the right ventricle.    Coronary status is essentially unchanged compared to 5 years ago.     Review of Systems: [y] = yes, [ ]  = no   . General: Weight gain [ Y]; Weight loss [ ] ; Anorexia [ ] ; Fatigue [Y ]; Fever [ ] ; Chills [ ] ; Weakness [ ]   . Cardiac: Chest pain/pressure [ ] ; Resting SOB [ ] ; Exertional SOB [ ] ; Orthopnea [Y ]; Pedal Edema [Y ]; Palpitations [ ] ; Syncope [ ] ; Presyncope [ ] ; Paroxysmal nocturnal dyspnea[Y ]  . Pulmonary: Cough [ ] ; Wheezing[ ] ; Hemoptysis[ ] ; Sputum [ ] ; Snoring [ ]   . GI: Vomiting[ ] ; Dysphagia[ ] ; Melena[ ] ; Hematochezia [ ] ; Heartburn[ ] ; Abdominal pain [ ] ; Constipation [ ] ; Diarrhea [ ] ; BRBPR [ ]   . GU: Hematuria[ ] ;  Dysuria [ ] ; Nocturia[ ]   . Vascular: Pain in legs with walking [ ] ; Pain in feet with lying flat [ ] ; Non-healing sores [ ] ; Stroke [ ] ; TIA [ ] ; Slurred speech [ ] ;  . Neuro: Headaches[ ] ; Vertigo[ ] ; Seizures[ ] ; Paresthesias[ ] ;Blurred vision [ ] ; Diplopia [ ] ; Vision changes [ ]   . Ortho/Skin: Arthritis [ ] ; Joint pain [ ] ; Muscle pain [ ] ; Joint swelling [ ] ; Back  Pain [ ] ; Rash [ ]   . Psych: Depression[ ] ; Anxiety[ ]   . Heme: Bleeding problems [ ] ; Clotting disorders [ ] ; Anemia [ ]   . Endocrine: Diabetes [Y ]; Thyroid dysfunction[ Y]  Home Medications Prior to Admission medications   Medication Sig Start Date End Date Taking? Authorizing Provider  acetaminophen (TYLENOL) 500 MG tablet Take 500 mg by mouth every 4 (four) hours as needed for moderate pain or headache.   Yes [provider]  amiodarone (PACERONE) 200 MG tablet TAKE 1/2 TABLET BY MOUTH DAILY 06/25/20  Yes Allred, Jeneen Rinks, MD  apixaban (ELIQUIS) 2.5 MG TABS tablet Take 1 tablet (2.5 mg total) by mouth 2 (two) times daily. 09/15/20  Yes Belva Crome, MD  Cholecalciferol (VITAMIN D) 50 MCG (2000 UT) tablet Take 2,000 Units by mouth daily.   Yes [provider]  ferrous sulfate 325 (65 FE) MG tablet Take 325 mg by mouth 2 (two) times daily with a meal.    Yes [provider]  furosemide (LASIX) 40 MG tablet Take 2 tablets (31m total) by mouth every morning.  Take one tablet by mouth every evening. Patient taking differently: Take 80 mg by mouth 2 (two) times daily. 08/19/20  Yes SBelva Crome MD  glimepiride (AMARYL) 2 MG tablet TAKE 1 TABLET DAILY BEFORE BREAKFAST 07/03/20  Yes KElayne Snare MD  insulin lispro (HUMALOG KWIKPEN) 100 UNIT/ML KwikPen Inject 12 Units into the skin 2 (two) times daily before lunch and supper. Inject 12 units under skin before lunch and dinner. May also inject 4 unit at breakfast is sugar reading is over 100. Patient taking differently: Inject 8-16 Units into the skin 3 (three) times daily. Inject 8 units for breakfast then Take 8-16 units as needed for under skin before lunch and dinner. May also inject 4 unit at breakfast is sugar reading is over 100. 05/28/20  Yes KElayne Snare MD  insulin NPH Human (HUMULIN N) 100 UNIT/ML injection Inject 26 units of Humulin N under the skin once daily at bedtime. 01/28/20  Yes KElayne Snare MD  KLOR-CON  M20 20 MEQ tablet TAKE 1 TABLET BY MOUTH TWICE A DAY 09/25/20  Yes KElayne Snare MD  liraglutide (VICTOZA) 18 MG/3ML SOPN Inject 1.8 mg daily 09/09/20  Yes KElayne Snare MD  metolazone (ZAROXOLYN) 5 MG tablet TAKEN ONE TABLET BY MOUTH ON MONDAY AND THURSDAY. TAKE 30 MINUTES PRIOR TO FUROSEMIDE. 05/07/20  Yes SBelva Crome MD  Multiple Vitamin (MULTIVITAMIN WITH MINERALS) TABS tablet Take 1 tablet by mouth daily. Mens One a day Vit   Yes [provider]  Multiple Vitamins-Minerals (PRESERVISION AREDS 2 PO) Take 1 capsule by mouth 2 (two) times daily.   Yes [provider]  pantoprazole (PROTONIX) 40 MG tablet Take 1 tablet (40 mg total) by mouth 2 (two) times daily. 11/19/19  Yes SBelva Crome MD  pravastatin (PRAVACHOL) 80 MG tablet Take 1 tablet (80 mg total) by mouth every evening. 09/29/20  Yes Kuneff, Renee A, DO  spironolactone (ALDACTONE) 25 MG tablet  Take 0.5 tablets (12.5 mg total) by mouth every Monday, Wednesday, and Friday. 12/05/19  Yes Belva Crome, MD  tamsulosin [facility]) 0.4 MG CAPS capsule Take 1 capsule daily 06/30/20  Yes Kuneff, Renee A, DO  ACCU-CHEK AVIVA PLUS test strip USE AS INSTRUCTED TO CHECK BLOOD SUGAR TWICE A DAY DX:E11.65 06/01/20   [name], MD  B-D UF III MINI PEN NEEDLES 31G X 5 MM MISC USE 2 PEN NEEDLE PER DAY WITH HUMALOG AND VICTOZA 06/24/20   [name], MD  Insulin Syringe-Needle U-100 (BD INSULIN SYRINGE U/F) 31G X 5/16" 1 ML MISC Use to inject insulin daily. DX:E11.9 04/18/20   [name], MD    Past Medical History: Past Medical History:  Diagnosis Date  . AKI (acute kidney injury) ([place]) 10/24/2019  . Anemia   . Arthritis   . CAD (coronary artery disease)    a. cardiac cath 12/2016 showing moderate AS, elevated LVEDP, heavy 3V coronary calcification, 100% mD2, 30-50% LAD, 50-90% stenosis of Cx proximal to origin of L-PDA, RCA not engaged due to poor catheter control (difficult procedure) - coronary status was essentially unchanged from  prior.  . Chicken pox   . Chronic diastolic CHF (congestive heart failure) ([place])   . Colon polyps   . Degenerative arthritis   . Diabetes ([place])   . Dyspnea   . Dyspnea on exertion 02/11/2015  . Essential hypertension   . Heart disease   . Heme positive stool 11/16/2017  . Hyperlipidemia   . New onset a-fib ([place]) 03/04/2018  . Obesity (BMI 30-39.9) 06/18/2015  . OSA (obstructive sleep apnea)    Severe with AHI 27/hr now on CPAP  not compliant with treatment.  . Peritonitis ([place]) 1985?  [name] Pneumonia    "6 months - 87 years old"  . Pure hypercholesterolemia   . RBBB   . [facility] spotted fever   . 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 Severe aortic stenosis    a. s/p TAVR 03/2017.  . Type II or unspecified type diabetes mellitus without mention of complication, not stated as uncontrolled   . Varicose vein of leg     Past Surgical History: Past Surgical History:  Procedure Laterality Date  . APPENDECTOMY  1985   peritonitis  . CARDIOVERSION N/A 04/03/2018   Procedure: CARDIOVERSION;  Surgeon: Josue Hector, MD;  Location: [facility] ENDOSCOPY;  Service: Cardiovascular;  Laterality: N/A;  . CARDIOVERSION N/A 01/18/2019   Procedure: CARDIOVERSION;  Surgeon: Skeet Latch, MD;  Location: [place];  Service: Cardiovascular;  Laterality: N/A;  . CARPAL TUNNEL RELEASE    . COLONOSCOPY WITH PROPOFOL N/A 11/18/2017   Procedure: COLONOSCOPY WITH PROPOFOL;  Surgeon: Carol Ada, MD;  Location: [facility] ENDOSCOPY;  Service: Endoscopy;  Laterality: N/A;  . ESOPHAGOGASTRODUODENOSCOPY N/A 11/18/2017   Procedure: ESOPHAGOGASTRODUODENOSCOPY (EGD);  Surgeon: Carol Ada, MD;  Location: [facility] ENDOS[facility];  Service: Endoscopy;  Laterality: N/A;  . EYE SURGERY Bilateral    cataracts removed, lens placed  . JOINT REPLACEMENT    . KNEE CARTILAGE SURGERY    . PACEMAKER IMPLANT  N/A 10/25/2019   Procedure: PACEMAKER IMPLANT;  Surgeon: Evans Lance, MD;  Location: [place] CV LAB;  Service: Cardiovascular;  Laterality: N/A;  . PARTIAL COLECTOMY    . RIGHT/LEFT HEART CATH AND CORONARY ANGIOGRAPHY N/A 12/28/2016   Procedure: Right/Left Heart Cath and Coronary Angiography;  Surgeon: Mallie Mussel  Nicholes Stairs, MD;  Location: [place] CV LAB;  Service: Cardiovascular;  Laterality: N/A;  . TEE WITHOUT CARDIOVERSION N/A 03/15/2017   Procedure: TRANSESOPHAGEAL ECHOCARDIOGRAM (TEE);  Surgeon: Burnell Blanks, MD;  Location: [place];  Service: Open Heart Surgery;  Laterality: N/A;  . TONSILLECTOMY AND ADENOIDECTOMY  1942  . TOTAL KNEE ARTHROPLASTY Right 11/08/2017  . TOTAL KNEE ARTHROPLASTY Right 11/08/2017   Procedure: TOTAL KNEE ARTHROPLASTY;  Surgeon: Melrose Nakayama, MD;  Location: [place];  Service: Orthopedics;  Laterality: Right;  . TRANSCATHETER AORTIC VALVE REPLACEMENT, TRANSFEMORAL N/A 03/15/2017   Procedure: TRANSCATHETER AORTIC VALVE REPLACEMENT, TRANSFEMORAL;  Surgeon: Burnell Blanks, MD;  Location: [place];  Service: Open Heart Surgery;  Laterality: N/A;  . VEIN SURGERY  1980    Family History: Family History  Problem Relation Age of Onset  . Heart failure Father   . Emphysema Father   . COPD Father   . Early death Father   . Heart attack Father   . Hypertension Mother   . Early death Mother   . Hyperlipidemia Mother   . Stroke Mother   . Cancer Sister   . Hypertension Sister   . Healthy Sister     Social History: Social History   Socioeconomic History  . Marital status: Married    Spouse name: Not on file  . Number of children: 4  . Years of education: Not on file  . Highest education level: Not on file  Occupational History  . Occupation: Construction-Retired  Tobacco Use  . Smoking status: Never Smoker  . Smokeless tobacco: Never Used  Vaping Use  . Vaping Use: Never used  Substance and Sexual Activity  . Alcohol use: No  . Drug  use: No  . Sexual activity: Not Currently    Partners: Female  Other Topics Concern  . 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 Bactrim [Sulfamethoxazole-Trimethoprim] Nausea And Vomiting and Other (See Comments)    Bleeding and ulcers  . Morphine And Related Nausea And Vomiting    Objective:    Vital Signs:   Temp:  [97.9 F (36.6 C)-98.4 F (36.9 C)] 97.9 F (36.6 C) (01/26 2005) Pulse Rate:  [59-70] 63 (01/27 0545) Resp:  [11-23] 11 (01/27 0545) BP: (93-132)/(47-78) 118/71 (01/27 0545) SpO2:  [96 %-100 %] 98 % (01/27 0545)    Weight change: There were no vitals filed for this visit.  Intake/Output:   Intake/Output Summary (Last 24 hours) at 10/02/2020 1152 Last data filed at 10/02/2020 1046 Gross per 24 hour  Intake -  Output 1425 ml  Net -1425 ml      Physical Exam    General:  Well appearing, moderately obese WM. No resp difficulty HEENT: hard of hearting otherwise normal Neck: supple. JVP elevated to ear . Carotids 2+ bilat; no bruits. No lymphadenopathy or thyromegaly appreciated. Cor: PMI nondisplaced. Regular rate & rhythm. No rubs, gallops or murmurs.  Lungs: clear no wheezing  Abdomen: obese, soft, nontender, nondistended. No hepatosplenomegaly. No bruits or masses. Good bowel sounds. Extremities: no cyanosis, clubbing, rash, 2+ bilateral LEE up to thighs  Neuro: alert & orientedx3, cranial nerves grossly intact. moves all 4 extremities w/o difficulty. Affect  pleasant   Telemetry   A paced 67 bpm   EKG    V paced 65 bpm.   Labs   Basic Metabolic Panel: Recent Labs  Lab 10/01/20 1422 10/01/20 1710 10/02/20 0814  NA 134*  --  135  K 4.0  --  3.6  CL 97*  --  97*  CO2 23  --  24  GLUCOSE 269*  --  171*  BUN 65*  --  63*  CREATININE 2.24*  --  2.06*  CALCIUM 8.9  --  8.9  MG  --  1.9  --     Liver Function Tests: Recent Labs  Lab 10/01/20 1710 10/02/20 0814  AST 25 26  ALT 20 16  ALKPHOS 66 62  BILITOT 1.2 1.4*  PROT 6.3* 6.2*  ALBUMIN 3.3* 3.3*   No results for input(s): LIPASE, AMYLASE in the last 168 hours. No results for input(s): AMMONIA in the last 168 hours.  CBC: Recent Labs  Lab 10/01/20 1422  WBC 8.8  HGB 10.7*  HCT 33.6*  MCV 92.1  PLT 152    Cardiac Enzymes: No results for input(s): CKTOTAL, CKMB, CKMBINDEX, TROPONINI in the last 168 hours.  BNP: BNP (last 3 results) Recent Labs    10/24/19 1913 10/01/20 1710  BNP 125.0* 433.6*    ProBNP (last 3 results) Recent Labs    11/01/19 1221 11/30/19 1342 01/30/20 1351  PROBNP 2,476* 2,277* 1,776*     CBG: Recent Labs  Lab 10/01/20 2214 10/02/20 0252 10/02/20 0833  GLUCAP 253* 173* 187*    Coagulation Studies: No results for input(s): LABPROT, INR in the last 72 hours.   Imaging   DG Chest 2 View  Result Date: 10/01/2020 CLINICAL DATA:  Acute shortness of breath. EXAM: CHEST - 2 VIEW COMPARISON:  10/26/2019 and prior studies FINDINGS: Cardiomegaly, aortic valve replacement and mild pulmonary vascular congestion noted. A LEFT pacemaker is again noted. Trace bilateral pleural effusions are noted. No airspace disease, pneumothorax or acute bony abnormality. IMPRESSION: Cardiomegaly with mild pulmonary vascular congestion and trace bilateral pleural effusions. Electronically Signed   By: Margarette Canada M.D.   On: 10/01/2020 14:36      Medications:     Current Medications: . amiodarone  100 mg Oral Daily  . apixaban  2.5 mg  Oral BID  . ferrous sulfate  325 mg Oral BID WC  . furosemide  80 mg Intravenous BID  . insulin aspart  0-15 Units Subcutaneous TID WC  . insulin aspart  0-5 Units Subcutaneous QHS  . insulin NPH Human  12 Units Subcutaneous QHS  . pantoprazole  40 mg Oral BID  . potassium chloride SA  20 mEq Oral BID  . pravastatin  80 mg Oral QPM  . sodium chloride flush  3 mL Intravenous Q12H  . tamsulosin  0.4 mg Oral QPC supper     Infusions: . sodium chloride        Assessment/Plan   1. Acute on Chronic Combined Systolic and Diastolic Heart Failure - Echo 2018 EF 55-60%.  - Echo 2019 EF 43%. RV mildly dilated  - Echo 09/2019 EF 35-40%, mod LVH, GIIDD, RV normal  - Now admitted for several month h/o worsening  symptoms NYHA Class IIIb + marked volume overload w/ failure to respond to escalation of home diuretic regimen.  - Poor initial response to IV Lasix, in setting of Stage IV CKD  - Agree w/ increasing diuretics. Switch to lasix gtt at 12 mg/hr + add 5 mg metolazone  - Place UNNA boots  - follow BMP. If rising SCr w/ attempts at diuresis, will place PICC line for co-ox to check for low OP  - Update echo to reassess LVEF/RV  - has known CAD but not candidate for repeat cath given renal function + lack of ischemic CP  - has PPM. Will interrogate device to check RV pacing %. ? RV pacing CM as symptoms worsened post implant.  - GDMT limited by Stage IV CKD. No ARNi/ARB, Spiro/ dig - continue hydral/ imdur  - recently failed Farxiga (nausea), consider trial of Jardiance (GFR 27)  - May also consider Verquvo  - also ? Amyloid given conduction disease, PAF, bradycardia requiring PPM, h/o severe AS, and bilateral carpal tunnel. Consider PYP after diuresis. Check urine immunofixation and multiple myeloma panel   2. Stage IV CKD - Baseline SCr ~2.0 - 2.2>>2.06 today  - follow BMP closely w/ diuresis   3. H/o Aortic Stenosis s/p TAVR (2018) - Echo 1/21 showed stable prosthesis. Mean gradient  9 mmHg - repeat echo   4. CAD  Last cath 12/2016   Heavy three-vessel coronary calcification.  100% stenosis in the mid body of the second diagonal. There is diffuse 30-50% narrowing in the LAD.  The circumflex coronary is dominant. Moderate to heavy calcification is noted throughout the distal vessel and in the proximal segment. There is eccentric 50-90% stenosis proximal to the origin of the L-PDA. Nondominant right coronary. Not selectively engaged due to poor catheter control. The vessel is patent and supplies only the right ventricle. - denies CP - no plans for repeat cath given lack of ischemic CP and Stage IV CKD - continue medical therapy  - statin/imdur - no ASA given eliquis for afib   5. PAF - a paced on tele.  - interrogate PPM to check afib burden  - continue amiodarone and eliquis   6. Bradycardia/ PPM - interrogate device to check RV pacing % given worsening HF   7. OSA  - intolerant to CPAP   8. T2DM - SSI per primary team   Length of Stay: [place], PA-C  10/02/2020, 11:52 AM  Advanced Heart Failure Team Pager [phone] (M-F; 7a - 4p)  Please contact [facility] for night-coverage after hours (4p -7a ) and weekends on [email or web address]  Patient seen with PA, agree with the above note.   Patient was admitted with progressive peripheral edema, dyspnea, and orthopnea.  No chest pain.  He failed titration of outpatient diuretics.  Has CKD stage IV, creatinine 2.06 today which is near his baseline.   General: NAD Neck: JVP 16 cm, no thyromegaly or thyroid nodule.  Lungs: Clear to auscultation bilaterally with normal respiratory effort. CV: Nondisplaced PMI.  Heart regular S1/S2, no S3/S4, 1/6 SEM RUSB.  2+ edema to thighs.  No carotid bruit.  Difficult to palpate pedal pulses due to edema.  Abdomen: Soft, nontender, no hepatosplenomegaly, mild distention.  Skin: Intact without lesions or rashes.  Neurologic: Alert and oriented x 3.  Psych: Normal  affect. Extremities: No clubbing or cyanosis.  HEENT: Normal.   1. Acute on chronic systolic CHF:  Last echo in 1/21 with EF 35-40%, moderate LVH.  Etiology uncertain, last cath in 2018 pre-TAVR with occluded D2 and 90% stenosis left PDA. He had MDT PPM for symptomatic bradycardia in 2/21 (after last echo).  In 11/21, he was noted to be RV pacing about 21% of the time. Additionally, he appears to be in atrial fibrillation today.  On exam, he is markedly volume overloaded.  Diuresis is complicated by CKD stage IV.   - He has had Lasix 80 mg IV this morning.  Will start gtt at 12 mg/hr and will give a dose of metolazone.  Follow creatinine closely.  - Unna boots.  - Needs repeat echo.  - Can continue low dose hydralazine/Imdur for now, BP looks stable.  - No Entresto, spironolactone yet with marginal renal function.  - He did not tolerate [place] well as outpatient, ?eventually rechallenge with SGLT2 inhibitor.  - Consider cardiac amyloidosis workup depending on appearance of echo this admission.  - Will ask MDT to interrogate PPM => confirm atrial fibrillation, also find out how much RV pacing that the patient is doing (if high percentage RV pacing, may be causing deterioration of LV function).  - Cannot rule out progressive CAD as cause of worsening CHF, but would not do coronary angiography in absence of ACS without improvement in creatinine.  2. CKD stage IV: Creatinine 2.06 today, at baseline.  Follow carefully with diuresis.  3. Atrial fibrillation: History of PAF on amiodarone.  Patient appears to be in atrial fibrillation on telemetry.  - ECG to confirm.  - Continue Eliquis, he missed a dose around the time of admission.  - Continue amiodarone.  - If we confirm he is out of rhythm, would favor cardioversion after he is reasonably well diuresed.  4. CAD: Cath in 2018 pre-TAVR with occluded D2 and 90% stenosis left PDA.  This was stable compared to prior cath and managed medically.  No chest  pain or ACS this admission.  - Cannot rule out progressive CAD as cause of worsening CHF, but would not do coronary angiography in absence of ACS without improvement in creatinine.  - Continue statin.  - No ASA given Eliquis use.  5. OSA: Severe, cannot tolerate CPAP.  6. H/o TAVR: Valve stable on last echo.  - Repeat echo this admission.   [name] 10/02/2020 2:14 PM

## 2020-10-02 NOTE — Progress Notes (Signed)
Orthopedic Tech Progress Note Patient Details:  Todd Romero 16-Mar-1934 [id number]  Ortho Devices Type of Ortho Device: Haematologist Ortho Device/Splint Location: BLE Ortho Device/Splint Interventions: Ordered,Application,Adjustment   Post Interventions Patient Tolerated: Well Instructions Provided: Care of device,Poper ambulation with device   Todd Romero 10/02/2020, 6:21 PM

## 2020-10-02 NOTE — Progress Notes (Addendum)
Progress Note  Patient Name: Todd Romero Date of Encounter: 10/02/2020  [facility] Cardiologist: [name], MD   Subjective   Patient breathing comfortably at the moment but is sitting completely upright. He reports shortness of breath and orthopnea overnight. No chest pain. Still has significant lower extremity edema as well as abdominal distension.   Inpatient Medications    Scheduled Meds: . amiodarone  100 mg Oral Daily  . apixaban  2.5 mg Oral BID  . ferrous sulfate  325 mg Oral BID WC  . furosemide  80 mg Intravenous BID  . insulin aspart  0-15 Units Subcutaneous TID WC  . insulin aspart  0-5 Units Subcutaneous QHS  . insulin NPH Human  12 Units Subcutaneous QHS  . pantoprazole  40 mg Oral BID  . potassium chloride SA  20 mEq Oral BID  . pravastatin  80 mg Oral QPM  . sodium chloride flush  3 mL Intravenous Q12H  . tamsulosin  0.4 mg Oral QPC supper   Continuous Infusions: . sodium chloride     PRN Meds: sodium chloride, acetaminophen, ondansetron (ZOFRAN) IV, sodium chloride flush, zolpidem   Vital Signs    Vitals:   10/01/20 2100 10/01/20 2130 10/02/20 0500 10/02/20 0545  BP: (!) 121/52 93/61 (!) 118/47 118/71  Pulse: (!) 59 61 67 63  Resp: 16 20 11 11   Temp:      TempSrc:      SpO2: 98% 96% 97% 98%    Intake/Output Summary (Last 24 hours) at 10/02/2020 1059 Last data filed at 10/02/2020 1046 Gross per 24 hour  Intake --  Output 1425 ml  Net -1425 ml   Last 3 Weights 09/18/2020 09/09/2020 08/12/2020  Weight (lbs) 232 lb 231 lb 232 lb  Weight (kg) 105.235 kg 104.781 kg 105.235 kg      Telemetry    Possible atrial fibrillation. Difficult to see P waves but rhythm is pretty regular. PVC noted. - Personally Reviewed  ECG    No new ECG tracing today. - Personally Reviewed  Physical Exam   GEN: Obese. No acute distress.   Neck: JVD elevated. Cardiac: RRR. No murmurs, rubs, or gallops.  Respiratory: No increased work of  breathing. Mild crackles in bases; otherwise, lungs clear to auscultation. GI: Soft, mildly distended, and non-tender. MS: 2-3+ pitting edema of bilateral lower extremities. No deformity. Skin: Warm and dry. Neuro:  No focal deficits. Psych: Normal affect. Responds appropriately.   Labs    High Sensitivity Troponin:  No results for input(s): TROPONINIHS in the last 720 hours.    Chemistry Recent Labs  Lab 10/01/20 1422 10/01/20 1710 10/02/20 0814  NA 134*  --  135  K 4.0  --  3.6  CL 97*  --  97*  CO2 23  --  24  GLUCOSE 269*  --  171*  BUN 65*  --  63*  CREATININE 2.24*  --  2.06*  CALCIUM 8.9  --  8.9  PROT  --  6.3* 6.2*  ALBUMIN  --  3.3* 3.3*  AST  --  25 26  ALT  --  20 16  ALKPHOS  --  66 62  BILITOT  --  1.2 1.4*  GFRNONAA 28*  --  31*  ANIONGAP 14  --  14     Hematology Recent Labs  Lab 10/01/20 1422  WBC 8.8  RBC 3.65*  HGB 10.7*  HCT 33.6*  MCV 92.1  MCH 29.3  MCHC  31.8  RDW 15.8*  PLT 152    BNP Recent Labs  Lab 10/01/20 1710  BNP 433.6*     DDimer No results for input(s): DDIMER in the last 168 hours.   Radiology    DG Chest 2 View  Result Date: 10/01/2020 CLINICAL DATA:  Acute shortness of breath. EXAM: CHEST - 2 VIEW COMPARISON:  10/26/2019 and prior studies FINDINGS: Cardiomegaly, aortic valve replacement and mild pulmonary vascular congestion noted. A LEFT pacemaker is again noted. Trace bilateral pleural effusions are noted. No airspace disease, pneumothorax or acute bony abnormality. IMPRESSION: Cardiomegaly with mild pulmonary vascular congestion and trace bilateral pleural effusions. Electronically Signed   By: Margarette Canada M.D.   On: 10/01/2020 14:36    Cardiac Studies   Right/Left Cardiac Catheterization 12/28/2016:  Difficult procedure due to severe angulation in the right innominate/ascending aortic region that produced catheter resistance to torque and advancement.  Moderate aortic stenosis with a transvalvular gradient  of 29 mmHg (mean) and 33-38 mmHg (peak to peak). Calculated aortic valve area 1.35 cm.  Elevated left ventricular end-diastolic pressure and pulmonary capillary wedge pressure. Known LV systolic function with EF greater than 50% by recent echo suggesting chronic diastolic left heart failure.  Heavy three-vessel coronary calcification.  100% stenosis in the mid body of the second diagonal. There is diffuse 30-50% narrowing in the LAD.  The circumflex coronary is dominant. Moderate to heavy calcification is noted throughout the distal vessel and in the proximal segment. There is eccentric 50-90% stenosis proximal to the origin of the L-PDA.  Nondominant right coronary. Not selectively engaged due to poor catheter control. The vessel is patent and supplies only the right ventricle.  Recommendations:  Coronary status is essentially unchanged compared to 5 years ago.  Moderate aortic stenosis based upon hemodynamics obtained from this case. This matches the recent echo.  Explanation for exertional fatigue is uncertain. Consider referral to the valve clinic for a second opinion concerning management of aortic stenosis. May need dobutamine echo as he is potentially a low gradient severe aortic stenosis substrate. _______________  Echocardiogram 09/13/2019: Impressions: 1. Left ventricular ejection fraction, by visual estimation, is 35 to  40%. The left ventricle has moderately decreased function. There is  moderately increased left ventricular hypertrophy.  2. Elevated left atrial pressure.  3. Left ventricular diastolic parameters are consistent with Grade II  diastolic dysfunction (pseudonormalization).  4. Global right ventricle has normal systolic function.The right  ventricular size is normal. No increase in right ventricular wall  thickness.  5. Left atrial size was moderately dilated.  6. Right atrial size was normal.  7. Moderate mitral annular calcification.  8. The  mitral valve is abnormal. No evidence of mitral valve  regurgitation.  9. The tricuspid valve is normal in structure.  10. Bioprosthetic AVR. Aortic valve regurgitation is not visualized. Mean  gradient 9 mmHg, stable from prior TTE on 03/05/18  11. The pulmonic valve was not well visualized. Pulmonic valve  regurgitation is not visualized.  12. There is mild dilatation of the ascending aorta measuring 36 mm.  13. Mildly elevated pulmonary artery systolic pressure.  14. The tricuspid regurgitant velocity is 2.67 m/s, and with an assumed  right atrial pressure of 8 mmHg, the estimated right ventricular systolic  pressure is mildly elevated at 36.5 mmHg.  15. The inferior vena cava is dilated in size with >50% respiratory  variability, suggesting right atrial pressure of 8 mmHg.    Patient Profile     87  y.o. male with a history of CAD with CTO of 2nd Diag and 90% stenosis of LCX , severe aortic stenosis s/p TAVR in 03/2017, chronic combined CHF, paroxysmal atrial fibrillation Eliquis, bradycardia s/p PPM in 10/2019, hypertension, hyperlipidemia, type 2 diabetes, and CKD stage IV who was directly admitted on 10/01/2020 for acute on chronic CHF.  Assessment & Plan    Acute on Chronic Combined CHF - BNP elevated at 433.  - Chest x-ray showed cardiomegaly with mild vascular congestion and trace bilateral pleural effusion.  - Echo in 09/2019 showed LVEF of 35-40% with grade 2 diastolic dysfunction. Repeat Echo pending. - Currently on IV Lasix 80mg  twice daily. Documented urinary output of 1.4L so far. No updated weight. Creatinine stable and coming down slightly. - Patient still grossly volume overloaded and had significant shortness of breath overnight. - Will increase IV Lasix to 80mg  three times daily. Will also give one dose of Metolazone 2.5mg . Patient currently on K-Dur 20 mEq twice daily. He has already received morning dose. With increase in diuresis, will increase dose to 40 mEq  tonight. - No ACEI/ARB/ARNI due to renal function. - Has not been on a beta-blocker at home. Previously had some bradycardia but no has a PPM so could consider restarting prior to discharge. Would not start now given decompensated CHF.  - Home Spironolactone on hold for now. - Continue to monitor daily weight, strict I/O's, and renal function. - Per MD, will ask Advanced CHF team to see.  CAD - R/LHC in 12/2016 showed CTO of 2nd Diagonal and 90% stenosis of LCX (unchanged from cath 5 years prior). Medical management recommended. - No angina.  - No aspirin given need for Eliquis. - Not on beta-blocker as stated above. - Continue home statin.  History of Aortic Stenosis s/p TAVR  - S/p TAVR in 03/2017. - Stable on last Echo in 09/2019. Mean gradient 9 mmHg. - Can reassess on repeat Echo.  Paroxysmal Atrial Fibrillation - Difficult to tell on telemetry whether patient is in atrial fibrillation or sinus rhythm. Will get 12 lead. Rates well controlled. - Continue Amiodarone 100mg  daily. - Continue Eliquis 2.5mg  twice daily (reduced dose due to age and renal function).  Hypertension - History of hypertension but BP soft at times. - Continue aggressive diuresis as above.  Hyperlipidemia - Continue home Pravastatin.   Insulin-Dependent Diabetes - Hemoglobin A1c 8.2%.  - Dr. Dwyane Dee recently placed patient on Farxiga but patient did not tolerate this due to nausea so it was stopped.  - Currently on sliding scale insulin.  - Diabetes Coordinator has been consulted and is following along.  CKD Stage IV - Creatinine 2.24 on admission. Baseline 1.6 to 2.1.  - Creatinine improved to 2.06 with diuresis. - Continue to monitor closely.  For questions or updates, please contact [facility] Please consult [email or web address] for contact info under        Signed, Darreld Mclean, PA-C  10/02/2020, 10:59 AM

## 2020-10-02 NOTE — Progress Notes (Signed)
Inpatient Diabetes Program Recommendations  AACE/ADA: New Consensus Statement on Inpatient Glycemic Control (2015)  Target Ranges:  Prepandial:   less than 140 mg/dL      Peak postprandial:   less than 180 mg/dL (1-2 hours)      Critically ill patients:  140 - 180 mg/dL   Lab Results  Component Value Date   GLUCAP 187 (H) 10/02/2020   HGBA1C 8.2 (H) 09/15/2020    Review of Glycemic Control  Diabetes history: DM 2 Outpatient Diabetes medications: Amaryl 2 mg Daily, Humalog 4 units breakfast if glucose >100, 12 units lunch and supper, NPH 26 units qhs, Victoza 1.8 mg Daily Current orders for Inpatient glycemic control:  NPH 12 units qhs Novolog 0-15 units tid + hs  A1c 8.2% on 1/10 Sees Endocrinology, Dr. Dwyane Dee, last visit on 1/13  Inpatient Diabetes Program Recommendations:    Novolog given overnight. First dose of long acting NPH scheduled tonight. Will watch pt on current trends and adjust regimen if needed.  Thanks,  Tama Headings RN, MSN, BC-ADM Inpatient Diabetes Coordinator Team Pager [phone] (8a-5p)

## 2020-10-02 NOTE — Procedures (Addendum)
Echo attempted. Patient stated he wasn't sure he could lay still for echo. Patient requested echo be attempted later. Will attempt again later.

## 2020-10-02 NOTE — Progress Notes (Signed)
   Got called to see patient because he just was not feeling right. Went to bedside. Patient states he just feels tense and like he cannot relax. He also reported that he could not lay still for his Echo. Vitals stable. He has mild crackles in bilateral bases. No adventitious heart sounds. It sounds like he may just be anxious. Discussed with Dr. Radford Pax - will give one dose of Xanax 0.25mg  to see if that helps.  Darreld Mclean, PA-C 10/02/2020 4:39 PM

## 2020-10-02 NOTE — Progress Notes (Signed)
Pt complained of 7/10 pain in posterior of right lower leg in calf region. Falk-Martin, MD made aware. New orders see MAR and pt repositioned. Will continue to monitor.   Elaina Hoops, RN

## 2020-10-02 NOTE — Procedures (Signed)
Echo attempted again. Patient stated he would still not be able to lay still for the echo.

## 2020-10-03 ENCOUNTER — Inpatient Hospital Stay (HOSPITAL_COMMUNITY): Payer: Medicare Other

## 2020-10-03 DIAGNOSIS — I5033 Acute on chronic diastolic (congestive) heart failure: Secondary | ICD-10-CM

## 2020-10-03 LAB — BASIC METABOLIC PANEL
Anion gap: 11 (ref 5–15)
BUN: 66 mg/dL — ABNORMAL HIGH (ref 8–23)
CO2: 28 mmol/L (ref 22–32)
Calcium: 8.9 mg/dL (ref 8.9–10.3)
Chloride: 96 mmol/L — ABNORMAL LOW (ref 98–111)
Creatinine, Ser: 2.13 mg/dL — ABNORMAL HIGH (ref 0.61–1.24)
GFR, Estimated: 30 mL/min — ABNORMAL LOW (ref >60)
Glucose, Bld: 105 mg/dL — ABNORMAL HIGH (ref 70–99)
Potassium: 3.6 mmol/L (ref 3.5–5.1)
Sodium: 135 mmol/L (ref 135–145)

## 2020-10-03 LAB — ECHOCARDIOGRAM COMPLETE
AR max vel: 1.5 cm2
AV Area VTI: 1.52 cm2
AV Area mean vel: 1.45 cm2
AV Mean grad: 5.5 mmHg
AV Peak grad: 10.1 mmHg
Ao pk vel: 1.59 m/s
Area-P 1/2: 2.91 cm2
Height: 67 in
S' Lateral: 5.3 cm
Weight: 3680 oz

## 2020-10-03 LAB — GLUCOSE, CAPILLARY
Glucose-Capillary: 99 mg/dL (ref 70–99)
Glucose-Capillary: 199 mg/dL — ABNORMAL HIGH (ref 70–99)
Glucose-Capillary: 295 mg/dL — ABNORMAL HIGH (ref 70–99)
Glucose-Capillary: 230 mg/dL — ABNORMAL HIGH (ref 70–99)

## 2020-10-03 MED ORDER — PERFLUTREN LIPID MICROSPHERE
INTRAVENOUS | Status: AC
  Administered 2020-10-03: 2 mL via INTRAVENOUS
  Filled 2020-10-03: qty 10

## 2020-10-03 MED ORDER — METOLAZONE 5 MG PO TABS
2.5000 mg | ORAL_TABLET | Freq: Once | ORAL | Status: AC
  Administered 2020-10-03: 2.5 mg via ORAL
  Filled 2020-10-03: qty 1

## 2020-10-03 MED ORDER — AMIODARONE HCL 200 MG PO TABS
200.0000 mg | ORAL_TABLET | Freq: Every day | ORAL | Status: DC
  Filled 2020-10-03: qty 1

## 2020-10-03 MED ORDER — AMIODARONE HCL 200 MG PO TABS
200.0000 mg | ORAL_TABLET | Freq: Every day | ORAL | Status: DC
  Administered 2020-10-04 – 2020-10-07 (×4): 200 mg via ORAL
  Filled 2020-10-03 (×4): qty 1

## 2020-10-03 MED ORDER — AMIODARONE HCL 200 MG PO TABS
100.0000 mg | ORAL_TABLET | Freq: Once | ORAL | Status: AC
  Administered 2020-10-03: 100 mg via ORAL

## 2020-10-03 MED ORDER — DOCUSATE SODIUM 100 MG PO CAPS
200.0000 mg | ORAL_CAPSULE | Freq: Every day | ORAL | Status: DC | PRN
  Administered 2020-10-03 – 2020-10-04 (×2): 200 mg via ORAL
  Filled 2020-10-03 (×3): qty 2

## 2020-10-03 MED ORDER — INSULIN ASPART 100 UNIT/ML ~~LOC~~ SOLN
2.0000 [IU] | Freq: Three times a day (TID) | SUBCUTANEOUS | Status: DC
  Administered 2020-10-03 – 2020-10-05 (×7): 2 [IU] via SUBCUTANEOUS

## 2020-10-03 MED ORDER — PERFLUTREN LIPID MICROSPHERE
1.0000 mL | INTRAVENOUS | Status: AC | PRN
  Filled 2020-10-03: qty 10

## 2020-10-03 MED ORDER — POTASSIUM CHLORIDE CRYS ER 20 MEQ PO TBCR
40.0000 meq | EXTENDED_RELEASE_TABLET | Freq: Once | ORAL | Status: AC
  Administered 2020-10-03: 40 meq via ORAL
  Filled 2020-10-03: qty 2

## 2020-10-03 NOTE — Progress Notes (Signed)
Inpatient Diabetes Program Recommendations  AACE/ADA: New Consensus Statement on Inpatient Glycemic Control (2015)  Target Ranges:  Prepandial:   less than 140 mg/dL      Peak postprandial:   less than 180 mg/dL (1-2 hours)      Critically ill patients:  140 - 180 mg/dL   Lab Results  Component Value Date   GLUCAP 199 (H) 10/03/2020   HGBA1C 8.2 (H) 09/15/2020    Review of Glycemic Control Results for Todd Romero, Todd Romero (MRN [id number]) as of 10/03/2020 13:33  Ref. Range 10/02/2020 16:18 10/02/2020 18:27 10/02/2020 20:54 10/03/2020 08:03 10/03/2020 11:12  Glucose-Capillary Latest Ref Range: 70 - 99 mg/dL 269 (H) 213 (H) 208 (H) 99 199 (H)  Diabetes history: DM 2 Outpatient Diabetes medications: Amaryl 2 mg Daily, Humalog 4 units breakfast if glucose >100, 12 units lunch and supper, NPH 26 units qhs, Victoza 1.8 mg Daily Current orders for Inpatient glycemic control:  NPH 12 units qhs Novolog 0-15 units tid + hs Inpatient Diabetes Program Recommendations:   May consider adding low dose meal coverage while in the hospital.  Consider Novolog 2 units tid with meals (hold if patient eats less than 50% or NPO).   Thanks,  Adah Perl, RN, BC-ADM Inpatient Diabetes Coordinator Pager [phone] (8a-5p)

## 2020-10-03 NOTE — Progress Notes (Signed)
  Echocardiogram 2D Echocardiogram has been performed.  Randa Lynn Ramandeep Arington 10/03/2020, 12:48 PM

## 2020-10-03 NOTE — Progress Notes (Signed)
Requested TEE/Dc-CV Monday.   No current spots but will make NPO 1/30 in the event we can get TEE/DC-CV on Monday.   Emmalene Kattner NP-C 4:19 PM

## 2020-10-03 NOTE — Progress Notes (Signed)
Patient ID: Todd Romero, male   DOB: 16-May-1934, 87 y.o.   MRN: [id number]     Advanced Heart Failure Rounding Note  PCP-Cardiologist: Sinclair Grooms, MD   Subjective:    Better diuresis, -3475 net negative.  Creatinine mildly higher, 2.06 => 2.13.   Device interrogation: 34% RV pacing.  Atrial fibrillation since 09/07/19.    Objective:   Weight Range: 104.3 kg Body mass index is 36.02 kg/m.   Vital Signs:   Temp:  [97.6 F (36.4 C)-97.8 F (36.6 C)] 97.6 F (36.4 C) (01/28 0804) Pulse Rate:  [58-64] 62 (01/28 0804) Resp:  [14-20] 14 (01/28 0804) BP: (108-127)/(60-73) 110/66 (01/28 0804) SpO2:  [97 %-100 %] 100 % (01/28 0804) Weight:  [104.3 kg] 104.3 kg (01/28 0534) Last BM Date: 10/02/20 (per pt)  Weight change: Filed Weights   10/03/20 0534  Weight: 104.3 kg    Intake/Output:   Intake/Output Summary (Last 24 hours) at 10/03/2020 0941 Last data filed at 10/03/2020 0828 Gross per 24 hour  Intake 336.94 ml  Output 3475 ml  Net -3138.06 ml      Physical Exam    General:  Well appearing. No resp difficulty HEENT: Normal Neck: Supple. JVP 16 cm. Carotids 2+ bilat; no bruits. No lymphadenopathy or thyromegaly appreciated. Cor: PMI nondisplaced. Irregular rate & rhythm. No rubs, gallops or murmurs. Lungs: Clear Abdomen: Soft, nontender, nondistended. No hepatosplenomegaly. No bruits or masses. Good bowel sounds. Extremities: No cyanosis, clubbing, rash. 2+ edema to knees.  Neuro: Alert & orientedx3, cranial nerves grossly intact. moves all 4 extremities w/o difficulty. Affect pleasant   Telemetry   Atrial fibrillation rate 60s (personally reviewed)  Labs    CBC Recent Labs    10/01/20 1422  WBC 8.8  HGB 10.7*  HCT 33.6*  MCV 92.1  PLT 093   Basic Metabolic Panel Recent Labs    10/01/20 1710 10/02/20 0814 10/03/20 0311  NA  --  135 135  K  --  3.6 3.6  CL  --  97* 96*  CO2  --  24 28  GLUCOSE  --  171* 105*  BUN  --  63* 66*   CREATININE  --  2.06* 2.13*  CALCIUM  --  8.9 8.9  MG 1.9  --   --    Liver Function Tests Recent Labs    10/01/20 1710 10/02/20 0814  AST 25 26  ALT 20 16  ALKPHOS 66 62  BILITOT 1.2 1.4*  PROT 6.3* 6.2*  ALBUMIN 3.3* 3.3*   No results for input(s): LIPASE, AMYLASE in the last 72 hours. Cardiac Enzymes No results for input(s): CKTOTAL, CKMB, CKMBINDEX, TROPONINI in the last 72 hours.  BNP: BNP (last 3 results) Recent Labs    10/24/19 1913 10/01/20 1710  BNP 125.0* 433.6*    ProBNP (last 3 results) Recent Labs    11/01/19 1221 11/30/19 1342 01/30/20 1351  PROBNP 2,476* 2,277* 1,776*     D-Dimer No results for input(s): DDIMER in the last 72 hours. Hemoglobin A1C No results for input(s): HGBA1C in the last 72 hours. Fasting Lipid Panel No results for input(s): CHOL, HDL, LDLCALC, TRIG, CHOLHDL, LDLDIRECT in the last 72 hours. Thyroid Function Tests Recent Labs    10/01/20 1710  TSH 0.783    Other results:   Imaging     No results found.   Medications:     Scheduled Medications: . amiodarone  200 mg Oral Daily  . apixaban  2.5 mg  Oral BID  . ferrous sulfate  325 mg Oral BID WC  . Gerhardt's butt cream   Topical Daily  . insulin aspart  0-15 Units Subcutaneous TID WC  . insulin aspart  0-5 Units Subcutaneous QHS  . insulin NPH Human  12 Units Subcutaneous QHS  . metolazone  2.5 mg Oral Once  . nystatin   Topical BID  . pantoprazole  40 mg Oral BID  . potassium chloride SA  40 mEq Oral BID  . pravastatin  80 mg Oral QPM  . sodium chloride flush  3 mL Intravenous Q12H  . tamsulosin  0.4 mg Oral QPC supper     Infusions: . sodium chloride    . furosemide (LASIX) 200 mg in dextrose 5% 100 mL (2mg /mL) infusion 12 mg/hr (10/03/20 0724)     PRN Medications:  sodium chloride, acetaminophen, ondansetron (ZOFRAN) IV, oxyCODONE-acetaminophen, sodium chloride flush, zolpidem   Assessment/Plan   1. Acute on chronic systolic CHF:  Last  echo in 1/21 with EF 35-40%, moderate LVH. Etiology uncertain, last cath in 2018 pre-TAVR with occluded D2 and 90% stenosis left PDA. He had MDT PPM for symptomatic bradycardia in 2/21 (after last echo).  He is RV pacing 34% of the time on device interrogation this admission. Additionally, he has been in atrial fibrillation since the beginning of January.  On exam, he remains markedly volume overloaded.  He diuresed well yesterday on Lasix gtt + metolazone. Creatinine mildly higher at 2.13.  - Continue Lasix gtt 12 mg/hr and will give a dose of metolazone 2.5 mg again today.  - Unna boots.  - Awaiting repeat echo.   - Can continue low dose hydralazine/Imdur for now, BP looks stable.  - No Entresto, spironolactone yet with marginal renal function.  - He did not tolerate [place] well as outpatient, ?eventually rechallenge with SGLT2 inhibitor.  - Consider cardiac amyloidosis workup depending on appearance of echo this admission.  - 34% RV pacing may be worsening CHF, will need to eventually discuss CRT upgrade with EP.  - Suspect atrial fibrillation also worsening CHF, eventual DCCV (see below).  - Cannot rule out progressive CAD as cause of worsening CHF, but would not do coronary angiography in absence of ACS without improvement in creatinine.  2. CKD stage IV: Creatinine 2.13 today, not far off baseline.  Follow carefully with diuresis.  3. Atrial fibrillation: History of PAF on amiodarone.  He has been in atrial fibrillation since early January.  - Continue Eliquis, he missed a dose around the time of admission.  - Increase amiodarone to 200 mg daily pre-DCCV.  - Would diurese over weekend, arrange for TEE-guided DCCV on Monday.  4. CAD: Cath in 2018 pre-TAVR with occluded D2 and 90% stenosis left PDA.  This was stable compared to prior cath and managed medically.  No chest pain or ACS this admission.  - Cannot rule out progressive CAD as cause of worsening CHF, but would not do coronary  angiography in absence of ACS without improvement in creatinine.  - Continue statin.  - No ASA given Eliquis use.  5. OSA: Severe, cannot tolerate CPAP.  6. H/o TAVR: Valve stable on last echo.  - Repeat echo this admission.   Length of Stay: 2  [name], MD  10/03/2020, 9:41 AM  Advanced Heart Failure Team Pager [phone] (M-F; 7a - 4p)  Please contact [facility] for night-coverage after hours (4p -7a ) and weekends on [email or web address]

## 2020-10-03 NOTE — Progress Notes (Signed)
Orthopedic Tech Progress Note Patient Details:  Todd Romero [birth year] [id number] RN called requesting Louretta Parma BOOTS have new coban, original unna boots came off Ortho Devices Type of Ortho Device: Haematologist Ortho Device/Splint Location: BLE Ortho Device/Splint Interventions: Application   Post Interventions Patient Tolerated: Well Instructions Provided: Care of [place] 10/03/2020, 4:52 PM

## 2020-10-03 NOTE — Progress Notes (Signed)
Pt oxygen saturation began to drop while sleeping, 2L Roy administered. Will continue to monitor.   Elaina Hoops, RN

## 2020-10-04 DIAGNOSIS — I5033 Acute on chronic diastolic (congestive) heart failure: Secondary | ICD-10-CM | POA: Diagnosis not present

## 2020-10-04 LAB — BLOOD GAS, ARTERIAL
Acid-Base Excess: 7.1 mmol/L — ABNORMAL HIGH (ref 0.0–2.0)
Bicarbonate: 30.3 mmol/L — ABNORMAL HIGH (ref 20.0–28.0)
FIO2: 28
O2 Saturation: 95.7 %
Patient temperature: 36.8
pCO2 arterial: 36.2 mmHg (ref 32.0–48.0)
pH, Arterial: 7.532 — ABNORMAL HIGH (ref 7.350–7.450)
pO2, Arterial: 91.5 mmHg (ref 83.0–108.0)

## 2020-10-04 LAB — CBC
HCT: 35 % — ABNORMAL LOW (ref 39.0–52.0)
Hemoglobin: 11.3 g/dL — ABNORMAL LOW (ref 13.0–17.0)
MCH: 29.4 pg (ref 26.0–34.0)
MCHC: 32.3 g/dL (ref 30.0–36.0)
MCV: 90.9 fL (ref 80.0–100.0)
Platelets: 150 K/uL (ref 150–400)
RBC: 3.85 MIL/uL — ABNORMAL LOW (ref 4.22–5.81)
RDW: 15.7 % — ABNORMAL HIGH (ref 11.5–15.5)
WBC: 11.1 K/uL — ABNORMAL HIGH (ref 4.0–10.5)
nRBC: 0 % (ref 0.0–0.2)

## 2020-10-04 LAB — BASIC METABOLIC PANEL
Anion gap: 13 (ref 5–15)
BUN: 70 mg/dL — ABNORMAL HIGH (ref 8–23)
CO2: 28 mmol/L (ref 22–32)
Calcium: 9.1 mg/dL (ref 8.9–10.3)
Chloride: 94 mmol/L — ABNORMAL LOW (ref 98–111)
Creatinine, Ser: 2.25 mg/dL — ABNORMAL HIGH (ref 0.61–1.24)
GFR, Estimated: 28 mL/min — ABNORMAL LOW (ref >60)
Glucose, Bld: 196 mg/dL — ABNORMAL HIGH (ref 70–99)
Potassium: 3.5 mmol/L (ref 3.5–5.1)
Sodium: 135 mmol/L (ref 135–145)

## 2020-10-04 LAB — GLUCOSE, CAPILLARY
Glucose-Capillary: 214 mg/dL — ABNORMAL HIGH (ref 70–99)
Glucose-Capillary: 230 mg/dL — ABNORMAL HIGH (ref 70–99)
Glucose-Capillary: 216 mg/dL — ABNORMAL HIGH (ref 70–99)

## 2020-10-04 LAB — MAGNESIUM: Magnesium: 1.9 mg/dL (ref 1.7–2.4)

## 2020-10-04 MED ORDER — RISPERIDONE 0.25 MG PO TABS
0.2500 mg | ORAL_TABLET | Freq: Every evening | ORAL | Status: DC | PRN
  Administered 2020-10-04 – 2020-10-08 (×4): 0.25 mg via ORAL
  Filled 2020-10-04 (×5): qty 1

## 2020-10-04 MED ORDER — LORAZEPAM 0.5 MG PO TABS
0.5000 mg | ORAL_TABLET | Freq: Once | ORAL | Status: AC
  Administered 2020-10-04: 0.5 mg via ORAL
  Filled 2020-10-04: qty 1

## 2020-10-04 NOTE — Progress Notes (Signed)
Patient ID: Todd Romero, male   DOB: 1934/03/20, 87 y.o.   MRN: [id number]     Advanced Heart Failure Rounding Note  PCP-Cardiologist: [name], MD   Subjective:    Excellent diuresis overnight on lasix gtt at 12. Weight down 7 pounds Creatinine mildly higher, 2.06 => 2.13 -> 2.25.   Device interrogation: 34% RV pacing.  Atrial fibrillation since 09/07/19.   Had terrible night. Very agitated and restless. Got Lorrin Mais which made it worse.   This am very groggy but will arouse. Respirations slowing frequently. Denies SOB, orthopnea or PND. Wife at bedside   Echo EF 30-35% RV mildly reduced TAVR ok Personally reviewed   Objective:   Weight Range: 101.4 kg Body mass index is 35.01 kg/m.   Vital Signs:   Temp:  [98.4 F (36.9 C)-98.5 F (36.9 C)] 98.5 F (36.9 C) (01/29 0437) Pulse Rate:  [61-73] 70 (01/29 1100) Resp:  [14-19] 14 (01/29 1100) BP: (106-118)/(55-73) 118/55 (01/29 1100) SpO2:  [90 %-98 %] 97 % (01/29 1100) Weight:  [101.4 kg] 101.4 kg (01/29 0447) Last BM Date: 10/02/20 (per pt)  Weight change: Filed Weights   10/03/20 0534 10/04/20 0447  Weight: 104.3 kg 101.4 kg    Intake/Output:   Intake/Output Summary (Last 24 hours) at 10/04/2020 1352 Last data filed at 10/03/2020 2131 Gross per 24 hour  Intake 352.07 ml  Output 2600 ml  Net -2247.93 ml      Physical Exam    General: Elderly male sitting up in bed. Lethargic but arousable  HEENT: normal Neck: supple. JVP 9-10. Carotids 2+ bilat; no bruits. No lymphadenopathy or thryomegaly appreciated. Cor: PMI nondisplaced. Irregular rate & rhythm. No rubs, gallops or murmurs. Lungs: clear Abdomen: soft, nontender, nondistended. No hepatosplenomegaly. No bruits or masses. Good bowel sounds. Extremities: no cyanosis, clubbing, rash, tr edema + UNNA Neuro: lethargic but arousable   Telemetry   Atrial fibrillation 60-70s (personally reviewed)  Labs    CBC Recent Labs    10/01/20 1422  10/04/20 0332  WBC 8.8 11.1*  HGB 10.7* 11.3*  HCT 33.6* 35.0*  MCV 92.1 90.9  PLT 152 001   Basic Metabolic Panel Recent Labs    10/01/20 1710 10/02/20 0814 10/03/20 0311 10/04/20 0332  NA  --    < > 135 135  K  --    < > 3.6 3.5  CL  --    < > 96* 94*  CO2  --    < > 28 28  GLUCOSE  --    < > 105* 196*  BUN  --    < > 66* 70*  CREATININE  --    < > 2.13* 2.25*  CALCIUM  --    < > 8.9 9.1  MG 1.9  --   --  1.9   < > = values in this interval not displayed.   Liver Function Tests Recent Labs    10/01/20 1710 10/02/20 0814  AST 25 26  ALT 20 16  ALKPHOS 66 62  BILITOT 1.2 1.4*  PROT 6.3* 6.2*  ALBUMIN 3.3* 3.3*   No results for input(s): LIPASE, AMYLASE in the last 72 hours. Cardiac Enzymes No results for input(s): CKTOTAL, CKMB, CKMBINDEX, TROPONINI in the last 72 hours.  BNP: BNP (last 3 results) Recent Labs    10/24/19 1913 10/01/20 1710  BNP 125.0* 433.6*    ProBNP (last 3 results) Recent Labs    11/01/19 1221 11/30/19 1342 01/30/20 1351  PROBNP 2,476* 2,277* 1,776*     D-Dimer No results for input(s): DDIMER in the last 72 hours. Hemoglobin A1C No results for input(s): HGBA1C in the last 72 hours. Fasting Lipid Panel No results for input(s): CHOL, HDL, LDLCALC, TRIG, CHOLHDL, LDLDIRECT in the last 72 hours. Thyroid Function Tests Recent Labs    10/01/20 1710  TSH 0.783    Other results:   Imaging    No results found.   Medications:     Scheduled Medications: . amiodarone  200 mg Oral Daily  . apixaban  2.5 mg Oral BID  . ferrous sulfate  325 mg Oral BID WC  . Gerhardt's butt cream   Topical Daily  . insulin aspart  0-15 Units Subcutaneous TID WC  . insulin aspart  0-5 Units Subcutaneous QHS  . insulin aspart  2 Units Subcutaneous TID WC  . insulin NPH Human  12 Units Subcutaneous QHS  . nystatin   Topical BID  . pantoprazole  40 mg Oral BID  . pravastatin  80 mg Oral QPM  . sodium chloride flush  3 mL Intravenous  Q12H  . tamsulosin  0.4 mg Oral QPC supper    Infusions: . sodium chloride      PRN Medications: sodium chloride, acetaminophen, docusate sodium, ondansetron (ZOFRAN) IV, oxyCODONE-acetaminophen, sodium chloride flush   Assessment/Plan   1. Acute on chronic systolic CHF:  Last echo in 1/21 with EF 35-40%, moderate LVH. Etiology uncertain, last cath in 2018 pre-TAVR with occluded D2 and 90% stenosis left PDA. He had MDT PPM for symptomatic bradycardia in 2/21 (after last echo).  He is RV pacing 34% of the time on device interrogation this admission. Additionally, he has been in atrial fibrillation since the beginning of January.  He diuresed well yesterday on Lasix gtt + metolazone. Creatinine mildly higher at 2.25.  - Echo  10/03/20 EF 30-35% RV mildly reduced TAVR ok Personally reviewed - CVP still mildly elevated but creatinine rising and edema nearly resolved and weight down to baseline (223 pounds). Will stop IV lasix today. If creatinine stable. Start po tomrrow - Continue [employer].  - Can continue low dose hydralazine/Imdur for now, BP looks stable.  - No Entresto, spironolactone yet with marginal renal function.  - He did not tolerate [place] well as outpatient, ?eventually rechallenge with SGLT2 inhibitor.  - Consider cardiac amyloidosis workup - 34% RV pacing may be worsening CHF, will need to eventually discuss CRT upgrade with EP.  - Suspect atrial fibrillation also worsening CHF, eventual DCCV (see below).  - Cannot rule out progressive CAD as cause of worsening CHF, but would not do coronary angiography in absence of ACS without improvement in creatinine.  2. CKD stage IV: Creatinine 2.25 today, not far off baseline.  Stop lasix gtt for now 3. Atrial fibrillation: History of PAF on amiodarone.  He has been in atrial fibrillation since early January.  - Continue Eliquis, he missed a dose around the time of admission.  - Continue amiodarone 200 mg daily pre-DCCV.  - Plan  TEE-guided DCCV on Monday.  4. CAD: Cath in 2018 pre-TAVR with occluded D2 and 90% stenosis left PDA.  This was stable compared to prior cath and managed medically.  No chest pain or ACS this admission.  - Cannot rule out progressive CAD as cause of worsening CHF, but would not do coronary angiography in absence of ACS without improvement in creatinine.  - Continue statin.  - No ASA given Eliquis use.  5.  OSA: Severe, cannot tolerate CPAP.  6. H/o TAVR: Valve stable on last echo.  - Repeat echo this admission.  7. Acute delirium/sundowning - no more ambien - check ABG to exclude CO2 retention - can try risperadal 2.5 prn for agitation at night   Length of Stay: 3  Glori Bickers, MD  10/04/2020, 1:52 PM  Advanced Heart Failure Team Pager [phone] (M-F; [place])  Please contact [facility] for night-coverage after hours (4p -7a ) and weekends on [email or web address]

## 2020-10-05 DIAGNOSIS — I4819 Other persistent atrial fibrillation: Secondary | ICD-10-CM | POA: Diagnosis not present

## 2020-10-05 DIAGNOSIS — I5033 Acute on chronic diastolic (congestive) heart failure: Secondary | ICD-10-CM | POA: Diagnosis not present

## 2020-10-05 LAB — GLUCOSE, CAPILLARY
Glucose-Capillary: 156 mg/dL — ABNORMAL HIGH (ref 70–99)
Glucose-Capillary: 145 mg/dL — ABNORMAL HIGH (ref 70–99)
Glucose-Capillary: 206 mg/dL — ABNORMAL HIGH (ref 70–99)
Glucose-Capillary: 311 mg/dL — ABNORMAL HIGH (ref 70–99)
Glucose-Capillary: 305 mg/dL — ABNORMAL HIGH (ref 70–99)

## 2020-10-05 LAB — BASIC METABOLIC PANEL
Anion gap: 12 (ref 5–15)
BUN: 63 mg/dL — ABNORMAL HIGH (ref 8–23)
CO2: 30 mmol/L (ref 22–32)
Calcium: 8.7 mg/dL — ABNORMAL LOW (ref 8.9–10.3)
Chloride: 92 mmol/L — ABNORMAL LOW (ref 98–111)
Creatinine, Ser: 2.06 mg/dL — ABNORMAL HIGH (ref 0.61–1.24)
GFR, Estimated: 31 mL/min — ABNORMAL LOW (ref >60)
Glucose, Bld: 155 mg/dL — ABNORMAL HIGH (ref 70–99)
Potassium: 3.2 mmol/L — ABNORMAL LOW (ref 3.5–5.1)
Sodium: 134 mmol/L — ABNORMAL LOW (ref 135–145)

## 2020-10-05 LAB — CBC
HCT: 33.4 % — ABNORMAL LOW (ref 39.0–52.0)
Hemoglobin: 10.8 g/dL — ABNORMAL LOW (ref 13.0–17.0)
MCH: 29.2 pg (ref 26.0–34.0)
MCHC: 32.3 g/dL (ref 30.0–36.0)
MCV: 90.3 fL (ref 80.0–100.0)
Platelets: 148 10*3/uL — ABNORMAL LOW (ref 150–400)
RBC: 3.7 MIL/uL — ABNORMAL LOW (ref 4.22–5.81)
RDW: 15.7 % — ABNORMAL HIGH (ref 11.5–15.5)
WBC: 12.4 10*3/uL — ABNORMAL HIGH (ref 4.0–10.5)
nRBC: 0 % (ref 0.0–0.2)

## 2020-10-05 MED ORDER — POTASSIUM CHLORIDE CRYS ER 20 MEQ PO TBCR
60.0000 meq | EXTENDED_RELEASE_TABLET | Freq: Once | ORAL | Status: AC
  Administered 2020-10-05: 60 meq via ORAL
  Filled 2020-10-05: qty 3

## 2020-10-05 MED ORDER — SODIUM CHLORIDE 0.9 % IV SOLN
INTRAVENOUS | Status: DC
Start: 2020-10-06 — End: 2020-10-07

## 2020-10-05 MED ORDER — TORSEMIDE 20 MG PO TABS: 40.0000 mg | ORAL_TABLET | Freq: Two times a day (BID) | ORAL | Status: DC

## 2020-10-05 NOTE — Plan of Care (Signed)
  Problem: Education: Goal: Knowledge of General Education information will improve Description Including pain rating scale, medication(s)/side effects and non-pharmacologic comfort measures Outcome: Progressing   

## 2020-10-05 NOTE — Progress Notes (Addendum)
Patient ID: Todd Romero, male   DOB: [birth year], 87 y.o.   MRN: [id number]     Advanced Heart Failure Rounding Note  PCP-Cardiologist: [name], MD   Subjective:    Excellent diuresis overnight on lasix gtt at 12. Weight down 7 pounds Creatinine mildly higher, 2.06 => 2.13 -> 2.25.   Device interrogation: 34% RV pacing.  Atrial fibrillation since 09/07/19.   Lasix gtt stopped yesterday. Creatinine improved 2.25 -> 2.06   More alert today. Feels weak. Denies SOB, orthopnea or PND. Anxious at times.   Echo EF 30-35% RV mildly reduced TAVR ok Personally reviewed   Objective:   Weight Range: 101.4 kg Body mass index is 35.01 kg/m.   Vital Signs:   Temp:  [97.8 F (36.6 C)-98.7 F (37.1 C)] 98.7 F (37.1 C) (01/30 1129) Pulse Rate:  [62-67] 67 (01/30 1129) Resp:  [18-22] 20 (01/30 1129) BP: (111-115)/(54-67) 114/67 (01/30 1129) SpO2:  [94 %-99 %] 96 % (01/30 1129) Last BM Date: 10/04/20  Weight change: Filed Weights   10/03/20 0534 10/04/20 0447  Weight: 104.3 kg 101.4 kg    Intake/Output:   Intake/Output Summary (Last 24 hours) at 10/05/2020 1212 Last data filed at 10/05/2020 0830 Gross per 24 hour  Intake 123 ml  Output 1000 ml  Net -877 ml      Physical Exam    General:  Elderly male sitting up in bed. No resp difficulty HEENT: normal Neck: supple. JVP 8-9 Carotids 2+ bilat; no bruits. No lymphadenopathy or thryomegaly appreciated. Cor: PMI nondisplaced. Irregular rate & rhythm. No rubs, gallops or murmurs. Lungs: clear Abdomen: soft, nontender, nondistended. No hepatosplenomegaly. No bruits or masses. Good bowel sounds. Extremities: no cyanosis, clubbing, rash, tr edema Neuro: alert & orientedx3, cranial nerves grossly intact. moves all 4 extremities w/o difficulty. Affect pleasant    Telemetry   Atrial fibrillation 60-70s Personally reviewed  Labs    CBC Recent Labs    10/04/20 0332 10/05/20 0419  WBC 11.1* 12.4*  HGB 11.3*  10.8*  HCT 35.0* 33.4*  MCV 90.9 90.3  PLT 150 144*   Basic Metabolic Panel Recent Labs    10/04/20 0332 10/05/20 0419  NA 135 134*  K 3.5 3.2*  CL 94* 92*  CO2 28 30  GLUCOSE 196* 155*  BUN 70* 63*  CREATININE 2.25* 2.06*  CALCIUM 9.1 8.7*  MG 1.9  --    Liver Function Tests No results for input(s): AST, ALT, ALKPHOS, BILITOT, PROT, ALBUMIN in the last 72 hours. No results for input(s): LIPASE, AMYLASE in the last 72 hours. Cardiac Enzymes No results for input(s): CKTOTAL, CKMB, CKMBINDEX, TROPONINI in the last 72 hours.  BNP: BNP (last 3 results) Recent Labs    10/24/19 1913 10/01/20 1710  BNP 125.0* 433.6*    ProBNP (last 3 results) Recent Labs    11/01/19 1221 11/30/19 1342 01/30/20 1351  PROBNP 2,476* 2,277* 1,776*     D-Dimer No results for input(s): DDIMER in the last 72 hours. Hemoglobin A1C No results for input(s): HGBA1C in the last 72 hours. Fasting Lipid Panel No results for input(s): CHOL, HDL, LDLCALC, TRIG, CHOLHDL, LDLDIRECT in the last 72 hours. Thyroid Function Tests No results for input(s): TSH, T4TOTAL, T3FREE, THYROIDAB in the last 72 hours.  Invalid input(s): FREET3  Other results:   Imaging    No results found.   Medications:     Scheduled Medications: . amiodarone  200 mg Oral Daily  . apixaban  2.5  mg Oral BID  . ferrous sulfate  325 mg Oral BID WC  . Gerhardt's butt cream   Topical Daily  . insulin aspart  0-15 Units Subcutaneous TID WC  . insulin aspart  0-5 Units Subcutaneous QHS  . insulin aspart  2 Units Subcutaneous TID WC  . insulin NPH Human  12 Units Subcutaneous QHS  . nystatin   Topical BID  . pantoprazole  40 mg Oral BID  . pravastatin  80 mg Oral QPM  . sodium chloride flush  3 mL Intravenous Q12H  . tamsulosin  0.4 mg Oral QPC supper    Infusions: . sodium chloride      PRN Medications: sodium chloride, acetaminophen, docusate sodium, ondansetron (ZOFRAN) IV, oxyCODONE-acetaminophen,  risperiDONE, sodium chloride flush   Assessment/Plan   1. Acute on chronic systolic CHF:  Last echo in 1/21 with EF 35-40%, moderate LVH. Etiology uncertain, last cath in 2018 pre-TAVR with occluded D2 and 90% stenosis left PDA. He had MDT PPM for symptomatic bradycardia in 2/21 (after last echo).  He is RV pacing 34% of the time on device interrogation this admission. Additionally, he has been in atrial fibrillation since the beginning of January.  He diuresed well yesterday on Lasix gtt + metolazone.  IV lasix stopped yesterday as weight was down to previous baseline (223 pounds) and creatinine was climbing. Creatinine improved today. No weight charted.  - Echo  10/03/20 EF 30-35% RV mildly reduced TAVR ok Personally reviewed - Resume po diuretics. Was on lasix 80 bid PTA. Will switch to torsemide 40 bid. Adjust as needed.  - Continue Unna boots.  - Can continue low dose hydralazine/Imdur for now, BP looks stable.  - No Entresto, spironolactone yet with marginal renal function.  - He did not tolerate [place] well as outpatient, ?eventually rechallenge with SGLT2 inhibitor.  - Consider cardiac amyloidosis workup - 34% RV pacing may be worsening CHF, will need to eventually discuss CRT upgrade with EP.  - Suspect atrial fibrillation also worsening CHF, eventual DCCV (see below).  - Cannot rule out progressive CAD as cause of worsening CHF, but would not do coronary angiography in absence of ACS without improvement in creatinine.  2. CKD stage IV: Creatinine 2.25 -> 2.06 today, not far off baseline. Will restart po diuretics 3. Atrial fibrillation: History of PAF on amiodarone.  He has been in atrial fibrillation since early January.  - Continue Eliquis, he missed a dose around the time of admission.  - Continue amiodarone 200 mg daily pre-DCCV.  - Plan TEE-guided DCCV tomorrow. Will keep NPO after MN 4. CAD: Cath in 2018 pre-TAVR with occluded D2 and 90% stenosis left PDA.  This was stable  compared to prior cath and managed medically.  No chest pain or ACS this admission.  - Cannot rule out progressive CAD as cause of worsening CHF, but would not do coronary angiography in absence of ACS without improvement in creatinine.  - Continue statin.  - No ASA given Eliquis use.  5. OSA: Severe, cannot tolerate CPAP.  6. H/o TAVR: Valve stable on last echo.  - Repeat echo this admission.  7. Acute delirium/sundowning - worsened with Lorrin Mais (now listed as allergy) - ABG without CO2 retention.  - improved - can try risperadal 2.5 prn for agitation at night 8. Physical deconditioning - very weak. Will likely need SNF - Consult PT/OT   Length of Stay: [place], MD  10/05/2020, 12:12 PM  Advanced Heart Failure Team Pager [phone] (  M-F; 7a - 4p)  Please contact [facility] for night-coverage after hours (4p -7a ) and weekends on [email or web address]

## 2020-10-06 ENCOUNTER — Inpatient Hospital Stay (HOSPITAL_COMMUNITY): Payer: Medicare Other

## 2020-10-06 ENCOUNTER — Inpatient Hospital Stay: Payer: Self-pay

## 2020-10-06 DIAGNOSIS — I5043 Acute on chronic combined systolic (congestive) and diastolic (congestive) heart failure: Secondary | ICD-10-CM | POA: Diagnosis not present

## 2020-10-06 LAB — MULTIPLE MYELOMA PANEL, SERUM
Albumin SerPl Elph-Mcnc: 3.1 g/dL (ref 2.9–4.4)
Albumin/Glob SerPl: 1.1 (ref 0.7–1.7)
Alpha 1: 0.3 g/dL (ref 0.0–0.4)
Alpha2 Glob SerPl Elph-Mcnc: 0.9 g/dL (ref 0.4–1.0)
B-Globulin SerPl Elph-Mcnc: 0.9 g/dL (ref 0.7–1.3)
Gamma Glob SerPl Elph-Mcnc: 0.9 g/dL (ref 0.4–1.8)
Globulin, Total: 3.1 g/dL (ref 2.2–3.9)
IgA: 236 mg/dL (ref 61–437)
IgG (Immunoglobin G), Serum: 928 mg/dL (ref 603–1613)
IgM (Immunoglobulin M), Srm: 71 mg/dL (ref 15–143)
Total Protein ELP: 6.2 g/dL (ref 6.0–8.5)

## 2020-10-06 LAB — URINALYSIS, ROUTINE W REFLEX MICROSCOPIC
Bilirubin Urine: NEGATIVE
Glucose, UA: NEGATIVE mg/dL
Ketones, ur: NEGATIVE mg/dL
Nitrite: NEGATIVE
Protein, ur: 100 mg/dL — AB
Specific Gravity, Urine: 1.011 (ref 1.005–1.030)
WBC, UA: >50 WBC/hpf — ABNORMAL HIGH (ref 0–5)
pH: 5 (ref 5.0–8.0)

## 2020-10-06 LAB — CBC
HCT: 32.4 % — ABNORMAL LOW (ref 39.0–52.0)
HCT: 31.7 % — ABNORMAL LOW (ref 39.0–52.0)
Hemoglobin: 10.5 g/dL — ABNORMAL LOW (ref 13.0–17.0)
Hemoglobin: 10.6 g/dL — ABNORMAL LOW (ref 13.0–17.0)
MCH: 29.4 pg (ref 26.0–34.0)
MCH: 30 pg (ref 26.0–34.0)
MCHC: 32.4 g/dL (ref 30.0–36.0)
MCHC: 33.4 g/dL (ref 30.0–36.0)
MCV: 90.8 fL (ref 80.0–100.0)
MCV: 89.8 fL (ref 80.0–100.0)
Platelets: 150 10*3/uL (ref 150–400)
Platelets: 145 10*3/uL — ABNORMAL LOW (ref 150–400)
RBC: 3.57 MIL/uL — ABNORMAL LOW (ref 4.22–5.81)
RBC: 3.53 MIL/uL — ABNORMAL LOW (ref 4.22–5.81)
RDW: 15.7 % — ABNORMAL HIGH (ref 11.5–15.5)
RDW: 15.9 % — ABNORMAL HIGH (ref 11.5–15.5)
WBC: 19.2 10*3/uL — ABNORMAL HIGH (ref 4.0–10.5)
WBC: 16.7 10*3/uL — ABNORMAL HIGH (ref 4.0–10.5)
nRBC: 0 % (ref 0.0–0.2)
nRBC: 0 % (ref 0.0–0.2)

## 2020-10-06 LAB — PROCALCITONIN: Procalcitonin: 0.38 ng/mL

## 2020-10-06 LAB — IMMUNOFIXATION, URINE

## 2020-10-06 LAB — BASIC METABOLIC PANEL
Anion gap: 15 (ref 5–15)
BUN: 71 mg/dL — ABNORMAL HIGH (ref 8–23)
CO2: 26 mmol/L (ref 22–32)
Calcium: 8.9 mg/dL (ref 8.9–10.3)
Chloride: 90 mmol/L — ABNORMAL LOW (ref 98–111)
Creatinine, Ser: 2.33 mg/dL — ABNORMAL HIGH (ref 0.61–1.24)
GFR, Estimated: 27 mL/min — ABNORMAL LOW (ref >60)
Glucose, Bld: 323 mg/dL — ABNORMAL HIGH (ref 70–99)
Potassium: 3.8 mmol/L (ref 3.5–5.1)
Sodium: 131 mmol/L — ABNORMAL LOW (ref 135–145)

## 2020-10-06 LAB — GLUCOSE, CAPILLARY
Glucose-Capillary: 230 mg/dL — ABNORMAL HIGH (ref 70–99)
Glucose-Capillary: 222 mg/dL — ABNORMAL HIGH (ref 70–99)
Glucose-Capillary: 230 mg/dL — ABNORMAL HIGH (ref 70–99)
Glucose-Capillary: 339 mg/dL — ABNORMAL HIGH (ref 70–99)
Glucose-Capillary: 284 mg/dL — ABNORMAL HIGH (ref 70–99)
Glucose-Capillary: 313 mg/dL — ABNORMAL HIGH (ref 70–99)

## 2020-10-06 LAB — COOXEMETRY PANEL
Carboxyhemoglobin: 1.2 % (ref 0.5–1.5)
Methemoglobin: 1.1 % (ref 0.0–1.5)
O2 Saturation: 64.6 %
Total hemoglobin: 10.4 g/dL — ABNORMAL LOW (ref 12.0–16.0)

## 2020-10-06 MED ORDER — MILRINONE LACTATE IN DEXTROSE 20-5 MG/100ML-% IV SOLN
0.1250 ug/kg/min | INTRAVENOUS | Status: AC
  Administered 2020-10-06 (×2): 0.25 ug/kg/min via INTRAVENOUS
  Administered 2020-10-07: 0.125 ug/kg/min via INTRAVENOUS
  Filled 2020-10-06 (×3): qty 100

## 2020-10-06 MED ORDER — SODIUM CHLORIDE 0.9% FLUSH
10.0000 mL | Freq: Two times a day (BID) | INTRAVENOUS | Status: DC
  Administered 2020-10-06 – 2020-10-10 (×9): 10 mL
  Administered 2020-10-11: 20 mL
  Administered 2020-10-12 – 2020-10-14 (×4): 10 mL

## 2020-10-06 MED ORDER — FUROSEMIDE 10 MG/ML IJ SOLN
12.0000 mg/h | INTRAVENOUS | Status: DC
  Administered 2020-10-06: 12 mg/h via INTRAVENOUS
  Filled 2020-10-06 (×2): qty 20

## 2020-10-06 MED ORDER — FUROSEMIDE 10 MG/ML IJ SOLN
40.0000 mg | Freq: Once | INTRAMUSCULAR | Status: AC
  Administered 2020-10-06: 40 mg via INTRAVENOUS
  Filled 2020-10-06: qty 4

## 2020-10-06 MED ORDER — INSULIN NPH (HUMAN) (ISOPHANE) 100 UNIT/ML ~~LOC~~ SUSP
15.0000 [IU] | Freq: Every day | SUBCUTANEOUS | Status: DC
  Administered 2020-10-06 – 2020-10-07 (×2): 15 [IU] via SUBCUTANEOUS
  Filled 2020-10-06: qty 10

## 2020-10-06 MED ORDER — SODIUM CHLORIDE 0.9% FLUSH: 10.0000 mL | INTRAVENOUS | Status: DC | PRN

## 2020-10-06 MED ORDER — METOLAZONE 5 MG PO TABS
2.5000 mg | ORAL_TABLET | Freq: Once | ORAL | Status: AC
  Administered 2020-10-06: 2.5 mg via ORAL
  Filled 2020-10-06: qty 1

## 2020-10-06 MED ORDER — INSULIN ASPART 100 UNIT/ML ~~LOC~~ SOLN
5.0000 [IU] | Freq: Three times a day (TID) | SUBCUTANEOUS | Status: DC
  Administered 2020-10-06 – 2020-10-14 (×19): 5 [IU] via SUBCUTANEOUS

## 2020-10-06 MED ORDER — CHLORHEXIDINE GLUCONATE CLOTH 2 % EX PADS
6.0000 | MEDICATED_PAD | Freq: Every day | CUTANEOUS | Status: DC
  Administered 2020-10-07 – 2020-10-14 (×7): 6 via TOPICAL

## 2020-10-06 MED ORDER — SODIUM CHLORIDE 0.9 % IV SOLN: INTRAVENOUS | Status: DC

## 2020-10-06 MED ORDER — POTASSIUM CHLORIDE CRYS ER 20 MEQ PO TBCR
40.0000 meq | EXTENDED_RELEASE_TABLET | Freq: Once | ORAL | Status: AC
  Administered 2020-10-06: 40 meq via ORAL
  Filled 2020-10-06: qty 2

## 2020-10-06 MED ORDER — SODIUM CHLORIDE 0.9 % IV SOLN
1.0000 g | INTRAVENOUS | Status: DC
  Administered 2020-10-06 – 2020-10-07 (×2): 1 g via INTRAVENOUS
  Filled 2020-10-06 (×2): qty 10

## 2020-10-06 NOTE — Evaluation (Signed)
Physical Therapy Evaluation Patient Details Name: STEFFEN HASE MRN: [id number] DOB: October 29, 1933 Today's Date: 10/06/2020   History of Present Illness  Pt with acute on chronic systolic chf. PMH - pacer, TAVR, ckd, afib, cad, TKR, DM  Clinical Impression  Pt admitted with above diagnosis and presents to PT with functional limitations due to deficits listed below (See PT problem list). Pt needs skilled PT to maximize independence and safety to allow discharge to SNF for further rehab prior to dc home.      Follow Up Recommendations SNF    Equipment Recommendations  None recommended by PT    Recommendations for Other Services       Precautions / Restrictions Precautions Precautions: Fall      Mobility  Bed Mobility Overal bed mobility: Needs Assistance Bed Mobility: Sidelying to Sit;Rolling Rolling: Mod assist Sidelying to sit: +2 for safety/equipment;Mod assist       General bed mobility comments: Assist to bring shoulders and hips over. Assist to bring legs over EOB, elevate trunk into sitting, and bring hips to EOB.    Transfers Overall transfer level: Needs assistance Equipment used: Rolling walker (2 wheeled) Transfers: Sit to/from [facility] Sit to Stand: +2 physical assistance;Mod assist Stand pivot transfers: +2 physical assistance;Min assist       General transfer comment: Used bed pad to bring hips up and for balance. Pivotal steps with rolling walker bed to chair. Verbal cues for hand placement.  Ambulation/Gait Ambulation/Gait assistance: +2 physical assistance;Min assist Gait Distance (Feet): 5 Feet Assistive device: Rolling walker (2 wheeled) Gait Pattern/deviations: Step-to pattern;Decreased step length - right;Decreased step length - left;Shuffle;Trunk flexed Gait velocity: decr Gait velocity interpretation: <1.31 ft/sec, indicative of household ambulator General Gait Details: Assist for balance and support. Followed with  recliner.  Stairs            Wheelchair Mobility    Modified Rankin (Stroke Patients Only)       Balance Overall balance assessment: Needs assistance Sitting-balance support: Bilateral upper extremity supported;Feet supported Sitting balance-Leahy Scale: Poor Sitting balance - Comments: UE support   Standing balance support: Bilateral upper extremity supported Standing balance-Leahy Scale: Poor Standing balance comment: walker and min assist for static standing                             Pertinent Vitals/Pain Pain Assessment: No/denies pain    Home Living Family/patient expects to be discharged to:: Private residence Living Arrangements: Spouse/significant other Available Help at Discharge: Family;Available 24 hours/day Type of Home: House Home Access: Stairs to enter Entrance Stairs-Rails: Left Entrance Stairs-Number of Steps: 3 Home Layout: Multi-level;Able to live on main level with bedroom/bathroom Home Equipment: Gilford Rile - 2 wheels;Cane - quad      Prior Function Level of Independence: Needs assistance   Gait / Transfers Assistance Needed: Amb with rolling walker with increasing difficulty           Hand Dominance        Extremity/Trunk Assessment   Upper Extremity Assessment Upper Extremity Assessment: Defer to OT evaluation    Lower Extremity Assessment Lower Extremity Assessment: Generalized weakness       Communication   Communication: HOH  Cognition Arousal/Alertness: Awake/alert Behavior During Therapy: WFL for tasks assessed/performed Overall Cognitive Status: Impaired/Different from baseline Area of Impairment: Memory;Following commands;Problem solving                     Memory:  Decreased short-term memory Following Commands: Follows one step commands consistently;Follows one step commands with increased time     Problem Solving: Slow processing;Requires verbal cues        General Comments       Exercises     Assessment/Plan    PT Assessment Patient needs continued PT services  PT Problem List Decreased strength;Decreased activity tolerance;Decreased balance;Decreased mobility;Decreased cognition;Decreased knowledge of use of DME       PT Treatment Interventions DME instruction;Gait training;Functional mobility training;Therapeutic activities;Therapeutic exercise;Balance training;Patient/family education    PT Goals (Current goals can be found in the Care Plan section)  Acute Rehab PT Goals Patient Stated Goal: get stronger PT Goal Formulation: With patient/family Time For Goal Achievement: 10/20/20 Potential to Achieve Goals: Good    Frequency Min 3X/week   Barriers to discharge Inaccessible home environment stairs to enter    Co-evaluation               AM-PAC PT "6 Clicks" Mobility  Outcome Measure Help needed turning from your back to your side while in a flat bed without using bedrails?: A Lot Help needed moving from lying on your back to sitting on the side of a flat bed without using bedrails?: A Lot Help needed moving to and from a bed to a chair (including a wheelchair)?: A Lot Help needed standing up from a chair using your arms (e.g., wheelchair or bedside chair)?: A Lot Help needed to walk in hospital room?: A Lot Help needed climbing 3-5 steps with a railing? : Total 6 Click Score: 11    End of Session Equipment Utilized During Treatment: Gait belt Activity Tolerance: Patient tolerated treatment well Patient left: in chair;with call bell/phone within reach;with chair alarm set;with family/visitor present Nurse Communication: Mobility status PT Visit Diagnosis: Other abnormalities of gait and mobility (R26.89);Unsteadiness on feet (R26.81);Muscle weakness (generalized) (M62.81)    Time: 4982-6415 PT Time Calculation (min) (ACUTE ONLY): 23 min   Charges:   PT Evaluation $PT Eval Moderate Complexity: 1 Mod PT Treatments $Gait Training:  8-22 mins        Julian Pager [phone] Office [place] 10/06/2020, 4:00 PM

## 2020-10-06 NOTE — Progress Notes (Addendum)
CARDIAC REHAB PHASE I   PRE:  Rate/Rhythm: 67 afib    BP: sitting 118/70    SaO2: 98 2 1/2L  Pt in bed. Discussed getting to recliner and made plans however pt began c/o severe right leg pain, 9/10. Declined being able to move right now. RN gave pain meds. We moved pt up in bed, elevated right leg. Spoke with PT who will attempt to mobilize later today. IV team in for PICC now. [phone]  [place], ACSM 10/06/2020 11:34 AM

## 2020-10-06 NOTE — Evaluation (Signed)
Occupational Therapy Evaluation Patient Details Name: Todd Romero MRN: [id number] DOB: 07-05-1934 Today's Date: 10/06/2020    History of Present Illness Pt with acute on chronic systolic chf. PMH - pacer, TAVR, ckd, afib, cad, TKR, DM   Clinical Impression   Pt had recently started using a RW and was needing assist for LB dressing in the weeks leading up to hospitalization. He presents with impaired cognition (hearing loss contributing), generalized weakness and impaired balance. He requires set up to total assist for ADL and +2 min to mod assist for mobility. Recommending rehab in SNF prior to return home with his supportive wife. Will follow acutely.    Follow Up Recommendations  SNF;Supervision/Assistance - 24 hour    Equipment Recommendations  3 in 1 bedside commode;Wheelchair (measurements OT);Wheelchair cushion (measurements OT)    Recommendations for Other Services       Precautions / Restrictions Precautions Precautions: Fall      Mobility Bed Mobility        General bed mobility comments: pt received in chair    Transfers Overall transfer level: Needs assistance Equipment used: Rolling walker (2 wheeled) Transfers: Sit to/from [facility] Sit to Stand: +2 physical assistance;Mod assist Stand pivot transfers: +2 physical assistance;Min assist       General transfer comment: assist to rise and steady using pad under hips    Balance Overall balance assessment: Needs assistance Sitting-balance support: Bilateral upper extremity supported;Feet supported Sitting balance-Leahy Scale: Poor Sitting balance - Comments: UE support   Standing balance support: Bilateral upper extremity supported Standing balance-Leahy Scale: Poor Standing balance comment: walker and min assist for static standing                           ADL either performed or assessed with clinical judgement   ADL Overall ADL's : Needs  assistance/impaired Eating/Feeding: Set up;Sitting   Grooming: Set up;Sitting   Upper Body Bathing: Minimal assistance;Sitting   Lower Body Bathing: Total assistance;+2 for physical assistance;Sit to/from stand   Upper Body Dressing : Minimal assistance;Sitting   Lower Body Dressing: +2 for physical assistance;Total assistance;Sit to/from stand   Toilet Transfer: +2 for physical assistance;Stand-pivot;RW;BSC;Minimal assistance   Toileting- Clothing Manipulation and Hygiene: +2 for physical assistance;Total assistance;Sit to/from stand               Vision Patient Visual Report: No change from baseline       Perception     Praxis      Pertinent Vitals/Pain Pain Assessment: No/denies pain     Hand Dominance Right   Extremity/Trunk Assessment Upper Extremity Assessment Upper Extremity Assessment: Generalized weakness (reports his R wrist feels like it is wrapped tight)   Lower Extremity Assessment Lower Extremity Assessment: Defer to PT evaluation   Cervical / Trunk Assessment Cervical / Trunk Assessment: Other exceptions (obesity)   Communication Communication Communication: HOH   Cognition Arousal/Alertness: Awake/alert Behavior During Therapy: WFL for tasks assessed/performed Overall Cognitive Status: Impaired/Different from baseline Area of Impairment: Memory;Following commands;Problem solving                     Memory: Decreased short-term memory Following Commands: Follows one step commands consistently;Follows one step commands with increased time     Problem Solving: Slow processing;Requires verbal cues     General Comments       Exercises     Shoulder Instructions      Home Living Family/patient expects to  be discharged to:: Private residence Living Arrangements: Spouse/significant other Available Help at Discharge: Family;Available 24 hours/day Type of Home: House Home Access: Stairs to enter [employer] of Steps:  3 Entrance Stairs-Rails: Left Home Layout: Multi-level;Able to live on main level with bedroom/bathroom     Bathroom Shower/Tub: Occupational psychologist: Standard     Home Equipment: Environmental consultant - 2 wheels;Cane - quad;Grab bars - tub/shower;Hand held shower head          Prior Functioning/Environment Level of Independence: Needs assistance  Gait / Transfers Assistance Needed: Amb with rolling walker with increasing difficulty ADL's / Homemaking Assistance Needed: standing to shower using grab bar, needing assistance for socks and pants over feet, could don his shoes with a shoe horn            OT Problem List: Decreased strength;Decreased activity tolerance;Impaired balance (sitting and/or standing);Decreased cognition;Decreased safety awareness;Decreased knowledge of use of DME or AE;Cardiopulmonary status limiting activity      OT Treatment/Interventions: Self-care/ADL training;DME and/or AE instruction;Therapeutic activities;Cognitive remediation/compensation;Patient/family education;Balance training;Therapeutic exercise    OT Goals(Current goals can be found in the care plan section) Acute Rehab OT Goals Patient Stated Goal: get stronger OT Goal Formulation: With patient Time For Goal Achievement: 10/20/20 Potential to Achieve Goals: Good ADL Goals Pt Will Perform Grooming: with min assist;standing (one activity) Pt Will Perform Upper Body Dressing: with supervision;sitting Pt Will Transfer to Toilet: with min assist;ambulating;bedside commode Pt Will Perform Toileting - Clothing Manipulation and hygiene: with mod assist;sit to/from stand Pt/caregiver will Perform Home Exercise Program: Increased strength;Both right and left upper extremity;With Supervision (level 2 theraband) Additional ADL Goal #1: Pt will participate in seated ADL without LOB x 10 minutes.  OT Frequency: Min 2X/week   Barriers to D/C:            Co-evaluation              AM-PAC OT  "6 Clicks" Daily Activity     Outcome Measure Help from another person eating meals?: A Little Help from another person taking care of personal grooming?: A Little Help from another person toileting, which includes using toliet, bedpan, or urinal?: Total Help from another person bathing (including washing, rinsing, drying)?: A Lot Help from another person to put on and taking off regular upper body clothing?: A Little Help from another person to put on and taking off regular lower body clothing?: Total 6 Click Score: 13   End of Session Equipment Utilized During Treatment: Gait belt;Rolling walker;Oxygen  Activity Tolerance: Patient tolerated treatment well Patient left: in chair;with call bell/phone within reach;with chair alarm set;with family/visitor present  OT Visit Diagnosis: Unsteadiness on feet (R26.81);Other abnormalities of gait and mobility (R26.89);Muscle weakness (generalized) (M62.81);Other symptoms and signs involving cognitive function                Time: 1500-1519 OT Time Calculation (min): 19 min Charges:  OT General Charges $OT Visit: 1 Visit OT Evaluation $OT Eval Moderate Complexity: 1 Mod  Nestor Lewandowsky, OTR/L Acute Rehabilitation Services Pager: [phone] Office: [phone]  Malka So 10/06/2020, 4:42 PM

## 2020-10-06 NOTE — Care Management Important Message (Signed)
Important Message  Patient Details  Name: Todd Romero MRN: [id number] Date of Birth: 04-24-1934   Medicare Important Message Given:  Yes     Shelda Altes 10/06/2020, 11:20 AM

## 2020-10-06 NOTE — Progress Notes (Signed)
Peripherally Inserted Central Catheter Placement  The IV Nurse has discussed with the patient and/or persons authorized to consent for the patient, the purpose of this procedure and the potential benefits and risks involved with this procedure.  The benefits include less needle sticks, lab draws from the catheter, and the patient may be discharged home with the catheter. Risks include, but not limited to, infection, bleeding, blood clot (thrombus formation), and puncture of an artery; nerve damage and irregular heartbeat and possibility to perform a PICC exchange if needed/ordered by physician.  Alternatives to this procedure were also discussed.  Bard Power PICC patient education guide, fact sheet on infection prevention and patient information card has been provided to patient /or left at bedside.  PICC inserted by Fredrik Cove, RN  PICC Placement Documentation  PICC Double Lumen 20/60/15 PICC Right Basilic 41 cm 0 cm (Active)  Indication for Insertion or Continuance of Line Chronic illness with exacerbations (CF, Sickle Cell, etc.) 10/06/20 1203  Exposed Catheter (cm) 0 cm 10/06/20 1203  Site Assessment Dry;Clean;Intact 10/06/20 1203  Lumen #1 Status Flushed;Saline locked;Blood return noted 10/06/20 1203  Lumen #2 Status Flushed;Saline locked;Blood return noted 10/06/20 1203  Dressing Type Transparent 10/06/20 1203  Dressing Status Clean;Dry;Intact 10/06/20 1203  Antimicrobial disc in place? Yes 10/06/20 [place] Not Applicable 61/53/79 4327  Line Care Connections checked and tightened 10/06/20 1203  Line Adjustment (NICU/IV Team Only) No 10/06/20 1203  Dressing Intervention New dressing 10/06/20 1203  Dressing Change Due 10/13/20 10/06/20 [place], Nicolette Bang 10/06/2020, 12:38 PM

## 2020-10-06 NOTE — Plan of Care (Signed)
  Problem: Clinical Measurements: Goal: Ability to maintain clinical measurements within normal limits will improve Outcome: Progressing   

## 2020-10-06 NOTE — Progress Notes (Addendum)
Patient ID: Todd Romero, male   DOB: Apr 23, 1934, 87 y.o.   MRN: [id number]     Advanced Heart Failure Rounding Note  PCP-Cardiologist: Sinclair Grooms, MD   Subjective:    Excellent diuresis on lasix gtt. Net I/Os this admit -7L. Transitioned back to PO diuretics yesterday. Wt up 4 lb.    SCr up, 2.06>>2.33   WBC elevated, 11>>12>>19K. AF. Denies cough, fever, chills, dysuria.   Device interrogation: 34% RV pacing.  Atrial fibrillation since 09/07/19   Echo EF 30-35% RV mildly reduced TAVR ok Personally reviewed   Objective:   Weight Range: 103 kg Body mass index is 35.57 kg/m.   Vital Signs:   Temp:  [98.7 F (37.1 C)] 98.7 F (37.1 C) (01/31 0631) Pulse Rate:  [60-67] 60 (01/31 0631) Resp:  [19-22] 22 (01/31 0631) BP: (104-123)/(54-67) 104/58 (01/31 0631) SpO2:  [94 %-100 %] 98 % (01/31 0631) Weight:  [103 kg] 103 kg (01/31 0631) Last BM Date: 10/05/20  Weight change: Filed Weights   10/03/20 0534 10/04/20 0447 10/06/20 0631  Weight: 104.3 kg 101.4 kg 103 kg    Intake/Output:   Intake/Output Summary (Last 24 hours) at 10/06/2020 0704 Last data filed at 10/06/2020 3354 Gross per 24 hour  Intake 1086 ml  Output 400 ml  Net 686 ml      Physical Exam    General:  Well appearing. No respiratory difficulty HEENT: normal Neck: supple. JVD 8 cm. Carotids 2+ bilat; no bruits. No lymphadenopathy or thyromegaly appreciated. Cor: PMI nondisplaced. Irregularly irregular rhythm. No rubs, gallops or murmurs. Lungs: faint crackles RLL, otherwise clear  Abdomen: soft, nontender, nondistended. No hepatosplenomegaly. No bruits or masses. Good bowel sounds. Extremities: no cyanosis, clubbing, rash, edema + bilateral unna boots  Neuro: alert & oriented x 3, cranial nerves grossly intact. moves all 4 extremities w/o difficulty. Affect pleasant. GU + Condom cath   Telemetry   Atrial fibrillation 60-70s w/ PVCs Personally reviewed  Labs    CBC Recent Labs     10/05/20 0419 10/06/20 0213  WBC 12.4* 19.2*  HGB 10.8* 10.5*  HCT 33.4* 32.4*  MCV 90.3 90.8  PLT 148* 562   Basic Metabolic Panel Recent Labs    10/04/20 0332 10/05/20 0419 10/06/20 0213  NA 135 134* 131*  K 3.5 3.2* 3.8  CL 94* 92* 90*  CO2 28 30 26   GLUCOSE 196* 155* 323*  BUN 70* 63* 71*  CREATININE 2.25* 2.06* 2.33*  CALCIUM 9.1 8.7* 8.9  MG 1.9  --   --    Liver Function Tests No results for input(s): AST, ALT, ALKPHOS, BILITOT, PROT, ALBUMIN in the last 72 hours. No results for input(s): LIPASE, AMYLASE in the last 72 hours. Cardiac Enzymes No results for input(s): CKTOTAL, CKMB, CKMBINDEX, TROPONINI in the last 72 hours.  BNP: BNP (last 3 results) Recent Labs    10/24/19 1913 10/01/20 1710  BNP 125.0* 433.6*    ProBNP (last 3 results) Recent Labs    11/01/19 1221 11/30/19 1342 01/30/20 1351  PROBNP 2,476* 2,277* 1,776*     D-Dimer No results for input(s): DDIMER in the last 72 hours. Hemoglobin A1C No results for input(s): HGBA1C in the last 72 hours. Fasting Lipid Panel No results for input(s): CHOL, HDL, LDLCALC, TRIG, CHOLHDL, LDLDIRECT in the last 72 hours. Thyroid Function Tests No results for input(s): TSH, T4TOTAL, T3FREE, THYROIDAB in the last 72 hours.  Invalid input(s): FREET3  Other results:   Imaging  No results found.   Medications:     Scheduled Medications: . amiodarone  200 mg Oral Daily  . apixaban  2.5 mg Oral BID  . ferrous sulfate  325 mg Oral BID WC  . Gerhardt's butt cream   Topical Daily  . insulin aspart  0-15 Units Subcutaneous TID WC  . insulin aspart  0-5 Units Subcutaneous QHS  . insulin aspart  2 Units Subcutaneous TID WC  . insulin NPH Human  12 Units Subcutaneous QHS  . nystatin   Topical BID  . pantoprazole  40 mg Oral BID  . pravastatin  80 mg Oral QPM  . sodium chloride flush  3 mL Intravenous Q12H  . tamsulosin  0.4 mg Oral QPC supper  . torsemide  40 mg Oral BID    Infusions: .  sodium chloride    . sodium chloride      PRN Medications: sodium chloride, acetaminophen, docusate sodium, ondansetron (ZOFRAN) IV, oxyCODONE-acetaminophen, risperiDONE, sodium chloride flush   Assessment/Plan   1. Acute on chronic systolic CHF:  Last echo in 1/21 with EF 35-40%, moderate LVH. Etiology uncertain, last cath in 2018 pre-TAVR with occluded D2 and 90% stenosis left PDA. He had MDT PPM for symptomatic bradycardia in 2/21 (after last echo).  He is RV pacing 34% of the time on device interrogation this admission. Additionally, he has been in atrial fibrillation since the beginning of January.  Echo  10/03/20 EF 30-35% RV mildly reduced TAVR ok Personally reviewed. He diuresed well over the weekend on Lasix gtt + metolazone.  IV lasix stopped  as weight was down to previous baseline (223 pounds) and creatinine was climbing. Torsemide 40 bid added yesterday (on Lasix 80 bid PTA). Wt up 4 lb and SCr back up, 2.06>>2.33. No dyspnea.  - Continue diuretics, if Scr/ Wt continues to trend up, may need RHC  - Continue Unna boots.  - Off hydralazine/Imdur with soft BP.   - No Entresto, spironolactone yet with marginal renal function.  - He did not tolerate [place] well as outpatient, ?eventually rechallenge with SGLT2 inhibitor.  - Consider cardiac amyloidosis workup - 34% RV pacing may be worsening CHF, will need to eventually discuss CRT upgrade with EP.  - Suspect atrial fibrillation also worsening CHF, plan DCCV prior to d/c - Cannot rule out progressive CAD as cause of worsening CHF, but would not do coronary angiography in absence of ACS without improvement in creatinine.  2. CKD stage IV: Creatinine 2.25 -> 2.33 today.  - continue diuretics - RHC if SCr continues to rise  3. Atrial fibrillation: History of PAF on amiodarone.  He has been in atrial fibrillation since early January.  - Continue Eliquis, he missed a dose around the time of admission.  - Continue amiodarone 200 mg daily  pre-DCCV.  - Plan TEE-guided DCCV tomorrow. Will keep NPO after MN 4. CAD: Cath in 2018 pre-TAVR with occluded D2 and 90% stenosis left PDA.  This was stable compared to prior cath and managed medically.  No chest pain or ACS this admission.  - Cannot rule out progressive CAD as cause of worsening CHF, but would not do coronary angiography in absence of ACS without improvement in creatinine.  - Continue statin.  - No ASA given Eliquis use.  5. OSA: Severe, cannot tolerate CPAP.  6. H/o TAVR: Valve stable on last echo.  - stable on repeat echo this admission.  7. Acute delirium/sundowning - worsened with Lorrin Mais (now listed as allergy) - ABG  without CO2 retention.  - improved - can try risperadal 2.5 prn for agitation at night 8. Physical deconditioning - very weak. Will likely need SNF - Consult PT/OT 9. Leukocytosis - WBC 11>>12>>19K. AF. Denies cough, fever, chills, dysuria. Has not received steroids - check UA, UC, PCT, CXR and blood cultures  - defer initiation of empiric abx to MD  Length of Stay: [address], PA-C  10/06/2020, 7:04 AM  Advanced Heart Failure Team Pager [phone] (M-F; 7a - 4p)  Please contact [facility] for night-coverage after hours (4p -7a ) and weekends on [email or web address]  Patient seen with PA, agree with the above note.   Creatinine up to 2.3 today.  Weight is up.  He is now on po torsemide.  He is short of breath, had a difficult weekend.  Very weak when he gets out of bed.   General: NAD Neck: JVP 14+ cm, no thyromegaly or thyroid nodule.  Lungs: Decreased at bases. . CV: Nondisplaced PMI.  Heart irregular S1/S2, no S3/S4, 2/6 SEM RUSB.  1+ edema to knees.   Abdomen: Soft, nontender, no hepatosplenomegaly, no distention.  Skin: Intact without lesions or rashes.  Neurologic: Alert and oriented x 3.  Psych: Normal affect. Extremities: No clubbing or cyanosis.  HEENT: Normal.   Difficult situation, he still looks volume overloaded on exam  with JVD though creatinine is higher at 2.3.  He is very short of breath when he tries to get up.  Echo with EF 30-35%, at least mild RV dysfunction.  - I will see if we can get PICC line to follow CVP and co-ox.  - I think we need to get him back on IV Lasix, will give 40 mg IV x 1 then 12 mg/hr infusion.  Will give dose of metolazone 2.5 x 1 and replace K.  - With rise in creatinine, will start milrinone empirically at 0.25 mcg/kg/min. Will hold when he is cardioverted.   He remains in atrial fibrillation, set up for TEE-DCCV tomorrow.  Continue amiodarone.   Rising WBCs, agree with CXR and cultures as above.   Needs to get out of bed, apparently did not do more than move to the bedside commode this weekend.  Needs PT, OT, cardiac rehab, mobilization!  [name] 10/06/2020 8:29 AM

## 2020-10-06 NOTE — Progress Notes (Signed)
Inpatient Diabetes Program Recommendations  AACE/ADA: New Consensus Statement on Inpatient Glycemic Control (2015)  Target Ranges:  Prepandial:   less than 140 mg/dL      Peak postprandial:   less than 180 mg/dL (1-2 hours)      Critically ill patients:  140 - 180 mg/dL   Results for KENDREW, PACI (MRN [id number]) as of 10/06/2020 10:11  Ref. Range 10/05/2020 08:12 10/05/2020 11:27 10/05/2020 16:24 10/05/2020 21:30  Glucose-Capillary Latest Ref Range: 70 - 99 mg/dL 145 (H)  4 units NOVOLOG  206 (H)  7 units NOVOLOG  311 (H)  13 units NOVOLOG  305 (H)  4 units NOVOLOG  12 units NPH   Results for OSEPH, IMBURGIA (MRN [id number]) as of 10/06/2020 10:11  Ref. Range 10/06/2020 07:58  Glucose-Capillary Latest Ref Range: 70 - 99 mg/dL 230 (H)  5 units NOVOLOG     Home DM Meds: Amaryl 2 mg Daily       Humalog 8 units breakfast if glucose >100, 8-16 units with lunch and supper         NPH 26 units qhs         Victoza 1.8 mg Daily   Current Orders: NPH Insulin 12 units QHS      Novolog Moderate Correction Scale/ SSI (0-15 units) TID AC + HS      Novolog 2 units TID with meals    MD- Please consider the following in-hospital insulin adjustments:  1. Increase NPH Insulin to 15 units QHS  2. Increase Novolog Meal Coverage to 5 units TID with meals     --Will follow patient during hospitalization--  Wyn Quaker RN, MSN, CDE Diabetes Coordinator Inpatient Glycemic Control Team Team Pager: [phone] (8a-5p)

## 2020-10-07 ENCOUNTER — Encounter (HOSPITAL_COMMUNITY)
Admission: EM | Disposition: A | Discharge status: Skilled Nursing Facility | Payer: Self-pay | Source: Home / Self Care | Attending: Cardiology

## 2020-10-07 ENCOUNTER — Inpatient Hospital Stay (HOSPITAL_COMMUNITY): Payer: Medicare Other

## 2020-10-07 ENCOUNTER — Encounter (HOSPITAL_COMMUNITY): Payer: Self-pay | Admitting: Cardiology

## 2020-10-07 ENCOUNTER — Inpatient Hospital Stay (HOSPITAL_COMMUNITY): Payer: Medicare Other | Admitting: Anesthesiology

## 2020-10-07 DIAGNOSIS — I5043 Acute on chronic combined systolic (congestive) and diastolic (congestive) heart failure: Secondary | ICD-10-CM | POA: Diagnosis not present

## 2020-10-07 DIAGNOSIS — I4891 Unspecified atrial fibrillation: Secondary | ICD-10-CM | POA: Diagnosis not present

## 2020-10-07 DIAGNOSIS — N179 Acute kidney failure, unspecified: Secondary | ICD-10-CM

## 2020-10-07 HISTORY — PX: CARDIOVERSION: SHX1299

## 2020-10-07 HISTORY — PX: TEE WITHOUT CARDIOVERSION: SHX5443

## 2020-10-07 LAB — BLOOD CULTURE ID PANEL (REFLEXED) - BCID2

## 2020-10-07 LAB — CBC
HCT: 27 % — ABNORMAL LOW (ref 39.0–52.0)
Hemoglobin: 9.2 g/dL — ABNORMAL LOW (ref 13.0–17.0)
MCH: 30.2 pg (ref 26.0–34.0)
MCHC: 34.1 g/dL (ref 30.0–36.0)
MCV: 88.5 fL (ref 80.0–100.0)
Platelets: 147 10*3/uL — ABNORMAL LOW (ref 150–400)
RBC: 3.05 MIL/uL — ABNORMAL LOW (ref 4.22–5.81)
RDW: 15.4 % (ref 11.5–15.5)
WBC: 14.2 10*3/uL — ABNORMAL HIGH (ref 4.0–10.5)
nRBC: 0 % (ref 0.0–0.2)

## 2020-10-07 LAB — BASIC METABOLIC PANEL
Anion gap: 12 (ref 5–15)
BUN: 87 mg/dL — ABNORMAL HIGH (ref 8–23)
CO2: 27 mmol/L (ref 22–32)
Calcium: 8.2 mg/dL — ABNORMAL LOW (ref 8.9–10.3)
Chloride: 92 mmol/L — ABNORMAL LOW (ref 98–111)
Creatinine, Ser: 2.95 mg/dL — ABNORMAL HIGH (ref 0.61–1.24)
GFR, Estimated: 20 mL/min — ABNORMAL LOW (ref >60)
Glucose, Bld: 286 mg/dL — ABNORMAL HIGH (ref 70–99)
Potassium: 3.6 mmol/L (ref 3.5–5.1)
Sodium: 131 mmol/L — ABNORMAL LOW (ref 135–145)

## 2020-10-07 LAB — COOXEMETRY PANEL
Carboxyhemoglobin: 1.3 % (ref 0.5–1.5)
Methemoglobin: 1.3 % (ref 0.0–1.5)
O2 Saturation: 72.7 %
Total hemoglobin: 9.5 g/dL — ABNORMAL LOW (ref 12.0–16.0)

## 2020-10-07 LAB — GLUCOSE, CAPILLARY
Glucose-Capillary: 272 mg/dL — ABNORMAL HIGH (ref 70–99)
Glucose-Capillary: 246 mg/dL — ABNORMAL HIGH (ref 70–99)
Glucose-Capillary: 343 mg/dL — ABNORMAL HIGH (ref 70–99)
Glucose-Capillary: 314 mg/dL — ABNORMAL HIGH (ref 70–99)

## 2020-10-07 LAB — PROTIME-INR
INR: 1.7 — ABNORMAL HIGH (ref 0.8–1.2)
Prothrombin Time: 19.5 seconds — ABNORMAL HIGH (ref 11.4–15.2)

## 2020-10-07 LAB — MAGNESIUM: Magnesium: 1.9 mg/dL (ref 1.7–2.4)

## 2020-10-07 SURGERY — ECHOCARDIOGRAM, TRANSESOPHAGEAL
Anesthesia: Monitor Anesthesia Care

## 2020-10-07 MED ORDER — AMIODARONE HCL IN DEXTROSE 360-4.14 MG/200ML-% IV SOLN
60.0000 mg/h | INTRAVENOUS | Status: AC
  Administered 2020-10-07: 60 mg/h via INTRAVENOUS
  Filled 2020-10-07 (×2): qty 200

## 2020-10-07 MED ORDER — SODIUM CHLORIDE 0.9 % IV SOLN
2.0000 g | INTRAVENOUS | Status: AC
  Administered 2020-10-07 – 2020-10-13 (×7): 2 g via INTRAVENOUS
  Filled 2020-10-07 (×7): qty 2

## 2020-10-07 MED ORDER — LIDOCAINE HCL (CARDIAC) PF 100 MG/5ML IV SOSY
PREFILLED_SYRINGE | INTRAVENOUS | Status: DC | PRN
  Administered 2020-10-07: 60 mg via INTRATRACHEAL

## 2020-10-07 MED ORDER — PROPOFOL 500 MG/50ML IV EMUL
INTRAVENOUS | Status: DC | PRN
  Administered 2020-10-07: 125 ug/kg/min via INTRAVENOUS

## 2020-10-07 MED ORDER — AMIODARONE HCL 200 MG PO TABS: 200.0000 mg | ORAL_TABLET | Freq: Two times a day (BID) | ORAL | Status: DC

## 2020-10-07 MED ORDER — AMIODARONE HCL IN DEXTROSE 360-4.14 MG/200ML-% IV SOLN
30.0000 mg/h | INTRAVENOUS | Status: DC
  Administered 2020-10-07 – 2020-10-08 (×3): 30 mg/h via INTRAVENOUS
  Filled 2020-10-07 (×2): qty 200

## 2020-10-07 MED ORDER — PROPOFOL 10 MG/ML IV BOLUS
INTRAVENOUS | Status: DC | PRN
  Administered 2020-10-07: 20 mg via INTRAVENOUS
  Administered 2020-10-07: 10 mg via INTRAVENOUS

## 2020-10-07 MED ORDER — SODIUM CHLORIDE 0.9 % IV SOLN: INTRAVENOUS | Status: DC | PRN

## 2020-10-07 NOTE — Procedures (Signed)
Electrical Cardioversion Procedure Note Todd Romero [id number] [birth year]  Procedure: Electrical Cardioversion Indications:  Atrial Fibrillation  Procedure Details Consent: Risks of procedure as well as the alternatives and risks of each were explained to the (patient/caregiver).  Consent for procedure obtained. Time Out: Verified patient identification, verified procedure, site/side was marked, verified correct patient position, special equipment/implants available, medications/allergies/relevent history reviewed, required imaging and test results available.  Performed  Patient placed on cardiac monitor, pulse oximetry, supplemental oxygen as necessary.  Sedation given: Propofol per anesthesiology Pacer pads placed anterior and posterior chest.  Cardioverted 1 time(s).  Cardioverted at [place].  Evaluation Findings: Post procedure EKG shows: NSR Complications: None Patient did tolerate procedure well.   Todd Romero 10/07/2020, 1:16 PM

## 2020-10-07 NOTE — Anesthesia Preprocedure Evaluation (Addendum)
Anesthesia Evaluation  Patient identified by MRN, date of birth, ID band Patient awake    Reviewed: Allergy & Precautions, NPO status , Patient's Chart, lab work & pertinent test results  History of Anesthesia Complications Negative for: history of anesthetic complications  Airway Mallampati: II  TM Distance: >3 FB Neck ROM: Full    Dental  (+) Missing,    Pulmonary sleep apnea ,    Pulmonary exam normal        Cardiovascular hypertension, + CAD and +CHF  Normal cardiovascular exam+ dysrhythmias Atrial Fibrillation + pacemaker   AS s/p TAVR 2018  TTE 10/03/20: EF 30-35%, global hypokinesis, modewrate LVH, mildly elevated PASP 84mHg, severe LAE/RAE   Neuro/Psych negative neurological ROS  negative psych ROS   GI/Hepatic negative GI ROS, Neg liver ROS,   Endo/Other  diabetes, Type 2  Renal/GU Renal InsufficiencyRenal disease  negative genitourinary   Musculoskeletal  (+) Arthritis ,   Abdominal   Peds  Hematology  (+) anemia ,   Anesthesia Other Findings Day of surgery medications reviewed with patient.  Reproductive/Obstetrics negative OB ROS                            Anesthesia Physical Anesthesia Plan  ASA: IV  Anesthesia Plan: MAC   Post-op Pain Management:    Induction:   PONV Risk Score and Plan: Treatment may vary due to age or medical condition and Propofol infusion  Airway Management Planned: Natural Airway and Nasal Cannula  Additional Equipment: None  Intra-op Plan:   Post-operative Plan:   Informed Consent: I have reviewed the patients History and Physical, chart, labs and discussed the procedure including the risks, benefits and alternatives for the proposed anesthesia with the patient or authorized representative who has indicated his/her understanding and acceptance.       Plan Discussed with: CRNA  Anesthesia Plan Comments:        Anesthesia  Quick Evaluation

## 2020-10-07 NOTE — Progress Notes (Signed)
PHARMACY - PHYSICIAN COMMUNICATION CRITICAL VALUE ALERT - BLOOD CULTURE IDENTIFICATION (BCID)  Todd Romero is an 87 y.o. male with acute HF and on antibiotics for concern of UTI  Assessment:  Blood cultures show GNR in 1/3 bottles and BCID shows Klebsiella aerogenes  Name of physician (or Provider) Contacted: R. Barrett  Current antibiotics: Rocephin 1gm IV q24h  Changes to prescribed antibiotics recommended:  Cefepime 2gm IV q24h Recommendations accepted by provider  Results for orders placed or performed during the hospital encounter of 10/01/20  Blood Culture ID Panel (Reflexed) (Collected: 10/06/2020  8:07 AM)  Result Value Ref Range   Enterococcus faecalis NOT DETECTED NOT DETECTED   Enterococcus Faecium NOT DETECTED NOT DETECTED   Listeria monocytogenes NOT DETECTED NOT DETECTED   Staphylococcus species NOT DETECTED NOT DETECTED   Staphylococcus aureus (BCID) NOT DETECTED NOT DETECTED   Staphylococcus epidermidis NOT DETECTED NOT DETECTED   Staphylococcus lugdunensis NOT DETECTED NOT DETECTED   Streptococcus species NOT DETECTED NOT DETECTED   Streptococcus agalactiae NOT DETECTED NOT DETECTED   Streptococcus pneumoniae NOT DETECTED NOT DETECTED   Streptococcus pyogenes NOT DETECTED NOT DETECTED   A.calcoaceticus-baumannii NOT DETECTED NOT DETECTED   Bacteroides fragilis NOT DETECTED NOT DETECTED   Enterobacterales DETECTED (A) NOT DETECTED   Enterobacter cloacae complex NOT DETECTED NOT DETECTED   Escherichia coli NOT DETECTED NOT DETECTED   Klebsiella aerogenes DETECTED (A) NOT DETECTED   Klebsiella oxytoca NOT DETECTED NOT DETECTED   Klebsiella pneumoniae NOT DETECTED NOT DETECTED   Proteus species NOT DETECTED NOT DETECTED   Salmonella species NOT DETECTED NOT DETECTED   Serratia marcescens NOT DETECTED NOT DETECTED   Haemophilus influenzae NOT DETECTED NOT DETECTED   Neisseria meningitidis NOT DETECTED NOT DETECTED   Pseudomonas aeruginosa NOT DETECTED NOT  DETECTED   Stenotrophomonas maltophilia NOT DETECTED NOT DETECTED   Candida albicans NOT DETECTED NOT DETECTED   Candida auris NOT DETECTED NOT DETECTED   Candida glabrata NOT DETECTED NOT DETECTED   Candida krusei NOT DETECTED NOT DETECTED   Candida parapsilosis NOT DETECTED NOT DETECTED   Candida tropicalis NOT DETECTED NOT DETECTED   Cryptococcus neoformans/gattii NOT DETECTED NOT DETECTED   CTX-M ESBL NOT DETECTED NOT DETECTED   Carbapenem resistance IMP NOT DETECTED NOT DETECTED   Carbapenem resistance KPC NOT DETECTED NOT DETECTED   Carbapenem resistance NDM NOT DETECTED NOT DETECTED   Carbapenem resist OXA 48 LIKE NOT DETECTED NOT DETECTED   Carbapenem resistance VIM NOT DETECTED NOT DETECTED    Hildred Laser, PharmD Clinical Pharmacist **Pharmacist phone directory can now be found on [email or web address] ([email or web address]).  Listed under [place].

## 2020-10-07 NOTE — Interval H&P Note (Signed)
History and Physical Interval Note:  10/07/2020 12:57 PM  Todd Romero  has presented today for surgery, with the diagnosis of afib.  The various methods of treatment have been discussed with the patient and family. After consideration of risks, benefits and other options for treatment, the patient has consented to  Procedure(s): TRANSESOPHAGEAL ECHOCARDIOGRAM (TEE) (N/A) CARDIOVERSION (N/A) as a surgical intervention.  The patient's history has been reviewed, patient examined, no change in status, stable for surgery.  I have reviewed the patient's chart and labs.  Questions were answered to the patient's satisfaction.     Donn Wilmot [employer]

## 2020-10-07 NOTE — TOC Initial Note (Signed)
Transition of Care [facility]) - Initial/Assessment Note    Patient Details  Name: Todd Romero MRN: [id number] Date of Birth: [birth year]  Transition of [facility]) CM/SW Contact:    Trula Ore, [place] Phone Number: 10/07/2020, 3:36 PM  Clinical Narrative:             CSW received consult for possible SNF placement at time of discharge. CSW spoke with patient and patients spouse at bedside regarding PT recommendation of SNF placement at time of discharge.  Patient expressed understanding of PT recommendation and is agreeable to SNF placement at time of discharge. Patient gave CSW permission to fax out initial referral near [facility] and [place] area.  Patient has received the COVID vaccines as well as booster. No further questions reported at this time. CSW to continue to follow and assist with discharge planning needs.        Expected Discharge Plan: Skilled Nursing Facility Barriers to Discharge: Continued Medical Work up   Patient Goals and CMS Choice Patient states their goals for this hospitalization and ongoing recovery are:: to go to SNF CMS Medicare.gov Compare Post Acute Care list provided to:: Patient Choice offered to / list presented to : Patient  Expected Discharge Plan and Services Expected Discharge Plan: [place]       Living arrangements for the past 2 months: Single Family Home                                      Prior Living Arrangements/Services Living arrangements for the past 2 months: Single Family Home Lives with:: Self,Spouse Patient language and need for interpreter reviewed:: Yes Do you feel safe going back to the place where you live?: No   SNF  Need for Family Participation in Patient Care: Yes (Comment) Care giver support system in place?: Yes (comment)   Criminal Activity/Legal Involvement Pertinent to Current Situation/Hospitalization: No - Comment as needed  Activities of Daily Living Home Assistive  Devices/Equipment: Walker (specify type) ADL Screening (condition at time of admission) Patient's cognitive ability adequate to safely complete daily activities?: Yes Is the patient deaf or have difficulty hearing?: Yes Does the patient have difficulty seeing, even when wearing glasses/contacts?: No Does the patient have difficulty concentrating, remembering, or making decisions?: No Patient able to express need for assistance with ADLs?: Yes Does the patient have difficulty dressing or bathing?: No Independently performs ADLs?: Yes (appropriate for developmental age) Does the patient have difficulty walking or climbing stairs?: Yes Weakness of Legs: Both Weakness of Arms/Hands: None  Permission Sought/Granted Permission sought to share information with : Case Manager,Family Chief Financial Officer Permission granted to share information with : Yes, Verbal Permission Granted  Share Information with NAME: [name]  Permission granted to share info w AGENCY: SNF  Permission granted to share info w Relationship: spouse  Permission granted to share info w Contact Information: [name]  [phone]  Emotional Assessment Appearance:: Appears stated age Attitude/Demeanor/Rapport: Gracious Affect (typically observed): Calm Orientation: : Oriented to Self,Oriented to Place,Oriented to  Time,Oriented to Situation Alcohol / Substance Use: Not Applicable Psych Involvement: No (comment)  Admission diagnosis:  Shortness of breath [R06.02] Acute on chronic diastolic HF (heart failure) ([place]) [I50.33] Acute on chronic combined systolic and diastolic CHF (congestive heart failure) ([place]) [I50.43] Patient Active Problem List   Diagnosis Date Noted  . Acute on chronic diastolic HF (heart failure) ([place]) 10/01/2020  .  Thyroid nodule greater than or equal to 1 cm in diameter incidentally noted on imaging study 09/01/2020  . Spondylosis, cervical 09/01/2020  . Sleep disturbance 04/23/2020  .  Tachycardia-bradycardia syndrome ([place]) 02/06/2020  . Pacemaker 11/08/2019  . Chronic anticoagulation 11/08/2019  . Symptomatic bradycardia 10/24/2019  . Benign prostatic hyperplasia 10/01/2019  . Stage 3b chronic kidney disease ([place]) 10/01/2019  . Secondary hypercoagulable state ([place]) 09/24/2019  . 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 Chronic combined systolic and diastolic CHF (congestive heart failure) ([place]) 03/27/2018  . Persistent atrial fibrillation ([place]) 03/04/2018  . GIB (gastrointestinal bleeding) 11/18/2017  . Anemia 11/16/2017  . Leukocytosis 11/16/2017  . 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 Posterior vitreous detachment, left 03/02/2016  . Pseudophakia of both eyes 03/02/2016  . Aortic stenosis 12/18/2015  . Obesity (BMI 30-39.9) 06/18/2015  . Intermediate stage nonexudative age-related macular degeneration of both eyes 02/27/2015  . Obstructive sleep apnea 02/11/2015  . Primary osteoarthritis of right knee 03/18/2014  . Essential hypertension, benign 06/21/2013    Class: Chronic  . RBBB   . CAD (coronary artery disease)     Class: Chronic  . Hyperlipidemia   . Type 2 diabetes mellitus ([place]) 05/02/2013  . Retinal tear, left 11/16/2011   PCP:  Ma Hillock, DO Pharmacy:   [id number] [place], [address] [place] [address] Phone: [phone] Fax: [phone]  [facility] [place], [place] [address] [place] [address] Phone: [phone] Fax: [phone]     Social Determinants of Health (SDOH) Interventions    Readmission Risk Interventions No flowsheet data found.

## 2020-10-07 NOTE — Anesthesia Postprocedure Evaluation (Signed)
Anesthesia Post Note  Patient: Todd Romero  Procedure(s) Performed: TRANSESOPHAGEAL ECHOCARDIOGRAM (TEE) (N/A ) CARDIOVERSION (N/A )     Patient location during evaluation: PACU Anesthesia Type: MAC Level of consciousness: awake and alert and oriented Pain management: pain level controlled Vital Signs Assessment: post-procedure vital signs reviewed and stable Respiratory status: spontaneous breathing, nonlabored ventilation and respiratory function stable Cardiovascular status: blood pressure returned to baseline Postop Assessment: no apparent nausea or vomiting Anesthetic complications: no   No complications documented.  Last Vitals:  Vitals:   10/07/20 1338 10/07/20 1348  BP: (!) 104/56 (!) 101/52  Pulse: 71 69  Resp: (!) 22 19  Temp:    SpO2: 96% 94%    Last Pain:  Vitals:   10/07/20 1338  TempSrc:   PainSc: 0-No pain                 Brennan Bailey

## 2020-10-07 NOTE — Anesthesia Procedure Notes (Signed)
Procedure Name: MAC Date/Time: 10/07/2020 12:57 PM Performed by: Kathryne Hitch, CRNA Pre-anesthesia Checklist: Patient identified, Emergency Drugs available, Suction available and Patient being monitored Patient Re-evaluated:Patient Re-evaluated prior to induction Oxygen Delivery Method: Circle system utilized Preoxygenation: Pre-oxygenation with 100% oxygen Induction Type: IV induction Placement Confirmation: positive ETCO2 Dental Injury: Teeth and Oropharynx as per pre-operative assessment

## 2020-10-07 NOTE — Progress Notes (Signed)
  Amiodarone Drug - Drug Interaction Consult Note  Recommendations: Drug interactions are minimal (prn Zofran and prn risperidone). Continue current regimen and will continue to monitor.   Amiodarone is metabolized by the cytochrome P450 system and therefore has the potential to cause many drug interactions. Amiodarone has an average plasma half-life of 50 days (range 20 to 100 days).   There is potential for drug interactions to occur several weeks or months after stopping treatment and the onset of drug interactions may be slow after initiating amiodarone.   []  Statins: Increased risk of myopathy. Simvastatin- restrict dose to 20mg  daily. Other statins: counsel patients to report any muscle pain or weakness immediately. Pt is on pravastatin.   []  Anticoagulants: Amiodarone can increase anticoagulant effect. Consider warfarin dose reduction. Patients should be monitored closely and the dose of anticoagulant altered accordingly, remembering that amiodarone levels take several weeks to stabilize.  []  Antiepileptics: Amiodarone can increase plasma concentration of phenytoin, the dose should be reduced. Note that small changes in phenytoin dose can result in large changes in levels. Monitor patient and counsel on signs of toxicity.  []  Beta blockers: increased risk of bradycardia, AV block and myocardial depression. Sotalol - avoid concomitant use.  []   Calcium channel blockers (diltiazem and verapamil): increased risk of bradycardia, AV block and myocardial depression.  []   Cyclosporine: Amiodarone increases levels of cyclosporine. Reduced dose of cyclosporine is recommended.  []  Digoxin dose should be halved when amiodarone is started.  []  Diuretics: increased risk of cardiotoxicity if hypokalemia occurs.  []  Oral hypoglycemic agents (glyburide, glipizide, glimepiride): increased risk of hypoglycemia. Patient's glucose levels should be monitored closely when initiating amiodarone therapy.    [x]  Drugs that prolong the QT interval:  Torsades de pointes risk may be increased with concurrent use - avoid if possible.  Monitor QTc, also keep magnesium/potassium WNL if concurrent therapy can't be avoided. [name] Antibiotics: e.g. fluoroquinolones, erythromycin. . Antiarrhythmics: e.g. quinidine, procainamide, disopyramide, sotalol. . Antipsychotics: e.g. phenothiazines, haloperidol.  . Lithium, tricyclic antidepressants, and methadone.  Thank Dennis Bast,  Christy Gentles  10/07/2020 2:20 PM

## 2020-10-07 NOTE — Transfer of Care (Signed)
Immediate Anesthesia Transfer of Care Note  Patient: Todd Romero  Procedure(s) Performed: TRANSESOPHAGEAL ECHOCARDIOGRAM (TEE) (N/A ) CARDIOVERSION (N/A )  Patient Location: Cath Lab  Anesthesia Type:MAC  Level of Consciousness: awake, alert  and oriented  Airway & Oxygen Therapy: Patient Spontanous Breathing and Patient connected to nasal cannula oxygen  Post-op Assessment: Report given to RN and Post -op Vital signs reviewed and stable  Post vital signs: Reviewed and stable  Last Vitals:  Vitals Value Taken Time  BP 101/43 10/07/20 1330  Temp 36.7 C 10/07/20 1329  Pulse 70 10/07/20 1330  Resp 21 10/07/20 1330  SpO2 99 % 10/07/20 1330  Vitals shown include unvalidated device data.  Last Pain:  Vitals:   10/07/20 1329  TempSrc: Temporal  PainSc:       Patients Stated Pain Goal: 0 (48/40/39 [id number])  Complications: No complications documented.

## 2020-10-07 NOTE — CV Procedure (Signed)
Procedure: TEE  Indication: atrial fibrillation  Sedation: Per anesthesiology  Findings: Please see echo section for full report.  Normal LV size and with mild LV hypertrophy.  EF 25-30%, diffuse hypokinesis.  Normal RV size with mildly decreased systolic function.  Mild left atrial enlargement, no LA appendage thrombus.  Mild right atrial enlargement.  No ASD or PFO by color doppler.  Trivial mitral regurgitation.  There is a bioprosthetic aortic valve s/p TAVR, mean gradient 9 mmHg with no significant regurgitation.  Trivial TR, peak RV-RA gradient 21 mmHg.  Normal caliber thoracic aorta with grade 3 plaque in the descending thoracic aorta.    Impression: May proceed to DCCV.   Todd Romero 10/07/2020 1:15 PM

## 2020-10-07 NOTE — Progress Notes (Addendum)
Inpatient Diabetes Program Recommendations  AACE/ADA: New Consensus Statement on Inpatient Glycemic Control (2015)  Target Ranges:  Prepandial:   less than 140 mg/dL      Peak postprandial:   less than 180 mg/dL (1-2 hours)      Critically ill patients:  140 - 180 mg/dL   Results for DERWARD, MARPLE (MRN [id number]) as of 10/07/2020 09:01  Ref. Range 10/06/2020 07:58 10/06/2020 09:37 10/06/2020 09:47 10/06/2020 12:17 10/06/2020 17:13 10/06/2020 22:16  Glucose-Capillary Latest Ref Range: 70 - 99 mg/dL 230 (H) 230 (H)  5 units NOVOLOG  222 (H) 339 (H)  16 units NOVOLOG  284 (H)  13 units NOVOLOG  313 (H)  4 units NOVOLOG  15 units NPH   Results for CALLUM, WOLF (MRN [id number]) as of 10/07/2020 09:01  Ref. Range 10/07/2020 07:34  Glucose-Capillary Latest Ref Range: 70 - 99 mg/dL 272 (H)  8 units NOVOLOG     Home DM Meds: Amaryl 2 mg Daily                             Humalog 8 units breakfast if glucose >100, 8-16 units with lunch and supper                                                     NPH 26 units qhs                                             Victoza 1.8 mg Daily   Current Orders: NPH Insulin 15 units QHS                            Novolog Moderate Correction Scale/ SSI (0-15 units) TID AC + HS                            Novolog 5 units TID with meals    MD- Please consider the following in-hospital insulin adjustments:  1. Increase NPH Insulin to 20 units QHS  2. Increase Novolog Meal Coverage to 8 units TID (home dose)    --Will follow patient during hospitalization--  Wyn Quaker RN, MSN, CDE Diabetes Coordinator Inpatient Glycemic Control Team Team Pager: [phone] (8a-5p)

## 2020-10-07 NOTE — Progress Notes (Signed)
   Had TEE/DC-CV today and initially in NSR. Back in A fib ~ 30 minutes later.    Stop po amio. Start IV amio. Repeat Cardioversion tomorrow at 1330   Stop milrinone tomorrow morning at 0600.   Dat Derksen NP-C  2:13 PM

## 2020-10-07 NOTE — H&P (View-Only) (Signed)
Patient ID: Todd Romero, male   DOB: 03-16-1934, 87 y.o.   MRN: [id number]     Advanced Heart Failure Rounding Note  PCP-Cardiologist: Sinclair Grooms, MD   Subjective:    IV Lasix restarted yesterday. On Lasix gtt at 12 hr. Also started on empiric milrinone, 0.25.   Co-ox yesterday 65% (on milrinone). Repeat co-ox for today pending.   ~1300 cc in UOP yesterday (~800 overnight). BMP pending. CVP 7-8   On Ceftriaxone for UTI. WBC down-trending, 19>>16>>14. AF. UC pending. BC NGTD.   Feels tired and weak. No dyspnea. Wife at bedside.   Device interrogation: 34% RV pacing.  Atrial fibrillation since 09/07/19   Echo EF 30-35% RV mildly reduced TAVR ok Personally reviewed   Objective:   Weight Range: 103 kg Body mass index is 35.57 kg/m.   Vital Signs:   Temp:  [99.5 F (37.5 C)] 99.5 F (37.5 C) (01/31 2124) Pulse Rate:  [80] 80 (01/31 2124) Resp:  [18] 18 (01/31 2124) BP: (123)/(68) 123/68 (01/31 2124) SpO2:  [98 %-99 %] 99 % (01/31 2124) FiO2 (%):  [28 %] 28 % (01/31 0846) Last BM Date: 10/05/20  Weight change: Filed Weights   10/03/20 0534 10/04/20 0447 10/06/20 0631  Weight: 104.3 kg 101.4 kg 103 kg    Intake/Output:   Intake/Output Summary (Last 24 hours) at 10/07/2020 0711 Last data filed at 10/06/2020 2320 Gross per 24 hour  Intake 724.1 ml  Output 580 ml  Net 144.1 ml      Physical Exam    CVP 7-8  General:  fatigue appearing elderly WM. No respiratory difficulty HEENT: normal Neck: supple. JVD 8 cm. Carotids 2+ bilat; no bruits. No lymphadenopathy or thyromegaly appreciated. Cor: PMI nondisplaced. Irregularly irregular rhythm. No rubs, gallops or murmurs. Lungs: decreased BS at bases bilaterally  Abdomen: soft, nontender, nondistended. No hepatosplenomegaly. No bruits or masses. Good bowel sounds. Extremities: no cyanosis, clubbing, rash, trace bilateral LE edema + bilateral unna boots  Neuro: alert & oriented x 3, cranial nerves grossly  intact. moves all 4 extremities w/o difficulty. Affect pleasant. GU + Condom cath   Telemetry   Atrial fibrillation 60-70s w/ PVCs Personally reviewed  Labs    CBC Recent Labs    10/06/20 0213 10/06/20 1430  WBC 19.2* 16.7*  HGB 10.5* 10.6*  HCT 32.4* 31.7*  MCV 90.8 89.8  PLT 150 629*   Basic Metabolic Panel Recent Labs    10/05/20 0419 10/06/20 0213  NA 134* 131*  K 3.2* 3.8  CL 92* 90*  CO2 30 26  GLUCOSE 155* 323*  BUN 63* 71*  CREATININE 2.06* 2.33*  CALCIUM 8.7* 8.9   Liver Function Tests No results for input(s): AST, ALT, ALKPHOS, BILITOT, PROT, ALBUMIN in the last 72 hours. No results for input(s): LIPASE, AMYLASE in the last 72 hours. Cardiac Enzymes No results for input(s): CKTOTAL, CKMB, CKMBINDEX, TROPONINI in the last 72 hours.  BNP: BNP (last 3 results) Recent Labs    10/24/19 1913 10/01/20 1710  BNP 125.0* 433.6*    ProBNP (last 3 results) Recent Labs    11/01/19 1221 11/30/19 1342 01/30/20 1351  PROBNP 2,476* 2,277* 1,776*     D-Dimer No results for input(s): DDIMER in the last 72 hours. Hemoglobin A1C No results for input(s): HGBA1C in the last 72 hours. Fasting Lipid Panel No results for input(s): CHOL, HDL, LDLCALC, TRIG, CHOLHDL, LDLDIRECT in the last 72 hours. Thyroid Function Tests No results for input(s): TSH, T4TOTAL,  T3FREE, THYROIDAB in the last 72 hours.  Invalid input(s): FREET3  Other results:   Imaging    DG Chest 2 View  Result Date: 10/06/2020 CLINICAL DATA:  Dyspnea, weakness EXAM: CHEST - 2 VIEW COMPARISON:  10/01/2020 FINDINGS: Pulmonary insufflation is normal and symmetric. The lungs are clear. No pneumothorax or pleural effusion. Transcatheter aortic valve replacement has been performed. Mild cardiomegaly is stable. Left subclavian dual lead pacemaker is unchanged. Pulmonary vascularity is normal. Degenerative changes are noted within the right shoulder. IMPRESSION: No active cardiopulmonary disease.   Stable cardiomegaly. Electronically Signed   By: Fidela Salisbury MD   On: 10/06/2020 08:49   DG CHEST PORT 1 VIEW  Result Date: 10/06/2020 CLINICAL DATA:  PICC line placement EXAM: PORTABLE CHEST 1 VIEW COMPARISON:  10/06/2020 0830 hours FINDINGS: Interval placement of right upper extremity PICC line with distal tip terminating level of the proximal right atrium. The left-sided implanted cardiac device. Aortic valve replacement. Stable cardiomegaly. Atherosclerotic calcification of the aortic knob. Mild bibasilar atelectasis. No pneumothorax. IMPRESSION: Interval placement of right upper extremity PICC line with distal tip terminating at the level of the proximal right atrium. No pneumothorax. Electronically Signed   By: Davina Poke D.O.   On: 10/06/2020 12:31   [place] EKG SITE RITE  Result Date: 10/06/2020 If Site Rite image not attached, placement could not be confirmed due to current cardiac rhythm.    Medications:     Scheduled Medications: . amiodarone  200 mg Oral Daily  . apixaban  2.5 mg Oral BID  . Chlorhexidine Gluconate Cloth  6 each Topical Daily  . ferrous sulfate  325 mg Oral BID WC  . Gerhardt's butt cream   Topical Daily  . insulin aspart  0-15 Units Subcutaneous TID WC  . insulin aspart  0-5 Units Subcutaneous QHS  . insulin aspart  5 Units Subcutaneous TID WC  . insulin NPH Human  15 Units Subcutaneous QHS  . nystatin   Topical BID  . pantoprazole  40 mg Oral BID  . pravastatin  80 mg Oral QPM  . sodium chloride flush  10-40 mL Intracatheter Q12H  . sodium chloride flush  3 mL Intravenous Q12H  . tamsulosin  0.4 mg Oral QPC supper    Infusions: . sodium chloride    . sodium chloride Stopped (10/06/20 0918)  . sodium chloride    . cefTRIAXone (ROCEPHIN)  IV 1 g (10/06/20 1615)  . furosemide (LASIX) 200 mg in dextrose 5% 100 mL (2mg /mL) infusion 12 mg/hr (10/06/20 0918)  . milrinone 0.25 mcg/kg/min (10/06/20 2328)    PRN Medications: sodium chloride,  acetaminophen, docusate sodium, ondansetron (ZOFRAN) IV, oxyCODONE-acetaminophen, risperiDONE, sodium chloride flush, sodium chloride flush   Assessment/Plan   1. Acute on chronic systolic CHF:  Last echo in 1/21 with EF 35-40%, moderate LVH. Etiology uncertain, last cath in 2018 pre-TAVR with occluded D2 and 90% stenosis left PDA. He had MDT PPM for symptomatic bradycardia in 2/21 (after last echo).  He is RV pacing 34% of the time on device interrogation this admission. Additionally, he has been in atrial fibrillation since the beginning of January.  Echo  10/03/20 EF 30-35% RV mildly reduced TAVR ok Personally reviewed. Good response to IV Lasix. Volume status improved. CVP 7-8 today. On milrinone 0.25. Co-ox pending. BMP pending - Stop Lasix gtt and transition back to PO diuretics, likely torsemide (was on Lasix 80 mg bid PTA) - Continue Unna boots.  - Off hydralazine/Imdur with soft BP.   -  No Entresto, spironolactone yet with marginal renal function.  - He did not tolerate [place] well as outpatient, ?eventually rechallenge with SGLT2 inhibitor.  - Consider cardiac amyloidosis workup - 34% RV pacing may be worsening CHF, will need to eventually discuss CRT upgrade with EP.  - Suspect atrial fibrillation also worsening CHF, plan DCCV today. Stop milrinone prior to DCCV.  - Cannot rule out progressive CAD as cause of worsening CHF, but would not do coronary angiography in absence of ACS without improvement in creatinine.  2. CKD stage IV: Creatinine 2.25 -> 2.33-> pending  - Transition to PO diuretics today - Follow BMP  3. Atrial fibrillation: History of PAF on amiodarone.  He has been in atrial fibrillation since early January.  - Continue Eliquis, he missed a dose around the time of admission.  - Continue amiodarone 200 mg daily pre-DCCV.  - Plan TEE-guided DCCV today. Stop Milrinone prior to procedure.  4. CAD: Cath in 2018 pre-TAVR with occluded D2 and 90% stenosis left PDA.  This was  stable compared to prior cath and managed medically.  No chest pain or ACS this admission.  - Cannot rule out progressive CAD as cause of worsening CHF, but would not do coronary angiography in absence of ACS without improvement in creatinine.  - Continue statin.  - No ASA given Eliquis use.  5. OSA: Severe, cannot tolerate CPAP.  6. H/o TAVR: Valve stable on last echo.  - stable on repeat echo this admission.  7. Acute delirium/sundowning - worsened with Lorrin Mais (now listed as allergy) - ABG without CO2 retention.  - improved - can try risperadal 2.5 prn for agitation at night 8. Physical deconditioning - very weak. Continue to mobilize w/ PT/OT - He will need SNF. Will place [facility] consult to assist w/ placement 9. UTI - WBC down-trending. AF.  - Continue ceftriaxone  - UC pending. BC NGTD   Length of Stay: [address] PA-C  10/07/2020, 7:11 AM  Advanced Heart Failure Team Pager [phone] (M-F; 7a - 4p)  Please contact [facility] for night-coverage after hours (4p -7a ) and weekends on [email or web address]  Patient seen with PA, agree with the above note.   UTI with GNRs in urine, started ceftriaxone yesterday.    Awake and alert this morning, had "pain all over" last night, was given Percocet.   Not much UOP recorded, looks like he has 800 cc or so in foley back (not sure I/Os being accurately recorded).  Creatinine up to 2.95.  CVP 8 on my read.  Co-ox 73%.    Able to walk with PT but needed assistance, recommend SNF.   General: NAD Neck: JVP 8 cm, no thyromegaly or thyroid nodule.  Lungs: Clear to auscultation bilaterally with normal respiratory effort. CV: Nondisplaced PMI.  Heart irregular S1/S2, no S3/S4, 1/6 SEM RUSB.  1+ ankle edema.   Abdomen: Soft, nontender, no hepatosplenomegaly, no distention.  Skin: Intact without lesions or rashes.  Neurologic: Alert and oriented x 3.  Psych: Normal affect. Extremities: No clubbing or cyanosis.  HEENT: Normal. '  AKI,  CVP down to 8.  Stop Lasix gtt.  Will hold diuretics for now.    Good co-ox on milrinone 0.25, decrease to 0.125 today.  Will try to continue this dose today to promote renal perfusion.   Continue ceftriaxone for UTI pending sensitivities.   Plan for TEE-guided DCCV today, continue amiodarone and apixaban.   [name] 10/07/2020 8:31 AM

## 2020-10-07 NOTE — NC FL2 (Signed)
[place] MEDICAID FL2 LEVEL OF CARE SCREENING TOOL     IDENTIFICATION  Patient Name: Future Yeldell Hargett Birthdate: April 22, 1934 Sex: male Admission Date (Current Location): 10/01/2020  [facility] and [place] Number:  Herbalist and Address:  [facility] [place], [place] [address], [place], [place] [address]      Provider Number: [id number]  Attending Physician Name and Address:  Sueanne Margarita, MD  Relative Name and Phone Number:  [name]  [phone]    Current Level of Care: Hospital Recommended Level of Care: [place] Prior Approval Number:    Date Approved/Denied:   PASRR Number: [id number]  Discharge Plan: SNF    Current Diagnoses: Patient Active Problem List   Diagnosis Date Noted  . Acute on chronic diastolic HF (heart failure) ([place]) 10/01/2020  . Thyroid nodule greater than or equal to 1 cm in diameter incidentally noted on imaging study 09/01/2020  . Spondylosis, cervical 09/01/2020  . Sleep disturbance 04/23/2020  . Tachycardia-bradycardia syndrome ([place]) 02/06/2020  . Pacemaker 11/08/2019  . Chronic anticoagulation 11/08/2019  . Symptomatic bradycardia 10/24/2019  . Benign prostatic hyperplasia 10/01/2019  . Stage 3b chronic kidney disease ([place]) 10/01/2019  . Secondary hypercoagulable state ([place]) 09/24/2019  . 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 Chronic combined systolic and diastolic CHF (congestive heart failure) ([place]) 03/27/2018  . Persistent atrial fibrillation ([place]) 03/04/2018  . GIB (gastrointestinal bleeding) 11/18/2017  . Anemia 11/16/2017  . Leukocytosis 11/16/2017  . 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 Posterior vitreous detachment, left 03/02/2016  . Pseudophakia of both eyes 03/02/2016  . Aortic stenosis 12/18/2015  . Obesity (BMI 30-39.9) 06/18/2015  . Intermediate stage nonexudative age-related macular degeneration of both eyes 02/27/2015  . Obstructive sleep apnea 02/11/2015   . Primary osteoarthritis of right knee 03/18/2014  . Essential hypertension, benign 06/21/2013  . RBBB   . CAD (coronary artery disease)   . Hyperlipidemia   . Type 2 diabetes mellitus ([place]) 05/02/2013  . Retinal tear, left 11/16/2011    Orientation RESPIRATION BLADDER Height & Weight     Self,Time,Situation,Place  Normal Incontinent,External catheter (External Urinary Catheter) Weight: 227 lb 1.6 oz (103 kg) Height:  5\' 7"  (170.2 cm)  BEHAVIORAL SYMPTOMS/MOOD NEUROLOGICAL BOWEL NUTRITION STATUS      Continent Diet (See Discharge Summary)  AMBULATORY STATUS COMMUNICATION OF NEEDS Skin   Limited Assist Verbally Skin abrasions,Surgical wounds,Other (Comment) (Dry,Flaky,Abrasion head mid upper anterior,wound incision open or dehiced MASD Pelvis anterior,R,L,Wound Incision open or dehiced MASD buttocks)                       Personal Care Assistance Level of Assistance  Bathing,Feeding,Dressing Bathing Assistance: Maximum assistance Feeding assistance: Independent (able to feed self;Cardiac) Dressing Assistance: Maximum assistance     Functional Limitations Info  Sight,Hearing,Speech Sight Info: Adequate Hearing Info: Impaired Speech Info: Adequate    SPECIAL CARE FACTORS FREQUENCY  PT (By licensed PT),OT (By licensed OT)     PT Frequency: 5x min weekly OT Frequency: 5x min weekly            Contractures Contractures Info: Not present    Additional Factors Info  Code Status,Allergies,Insulin Sliding Scale Code Status Info: FULL Allergies Info: Ambien (zolpidem Tartrate),Bactrim (sulfamethoxazole-trimethoprim),Morphine And Related   Insulin Sliding Scale Info: insulin aspart (novoLOG) injection 0-15 Units 3 times daily with meals,insulin aspart (novoLOG) injection 0-5 Units daily at bedtime,insulin aspart (novoLOG) injection 5 Units 3 times daily with meals,insulin NPH Human (  NOVOLIN N) injection 15 Units daily at bedtime       Current Medications (10/07/2020):   This is the current hospital active medication list Current Facility-Administered Medications  Medication Dose Route Frequency Provider Last Rate Last Admin  . 0.9 %  sodium chloride infusion  250 mL Intravenous PRN Isaiah Serge, NP      . acetaminophen (TYLENOL) tablet 650 mg  650 mg Oral Q4H PRN Isaiah Serge, NP   650 mg at 10/07/20 1446  . amiodarone (NEXTERONE PREMIX) 360-4.14 MG/200ML-% (1.8 mg/mL) IV infusion  60 mg/hr Intravenous Continuous Clegg, Amy D, NP 33.3 mL/hr at 10/07/20 1559 60 mg/hr at 10/07/20 1559   Followed by  . amiodarone (NEXTERONE PREMIX) 360-4.14 MG/200ML-% (1.8 mg/mL) IV infusion  30 mg/hr Intravenous Continuous Clegg, Amy D, NP      . apixaban (ELIQUIS) tablet 2.5 mg  2.5 mg Oral BID Isaiah Serge, NP   2.5 mg at 10/07/20 0844  . cefTRIAXone (ROCEPHIN) 1 g in sodium chloride 0.9 % 100 mL IVPB  1 g Intravenous Q24H Lyda Jester M, PA-C 200 mL/hr at 10/06/20 1615 1 g at 10/06/20 1615  . Chlorhexidine Gluconate Cloth 2 % PADS 6 each  6 each Topical Daily Turner, Traci R, MD      . docusate sodium (COLACE) capsule 200 mg  200 mg Oral Daily PRN Clegg, Amy D, NP   200 mg at 10/04/20 1704  . ferrous sulfate tablet 325 mg  325 mg Oral BID WC Isaiah Serge, NP   325 mg at 10/06/20 1616  . Gerhardt's butt cream   Topical Daily Consuelo Pandy, [place]   Given at 10/06/20 1027  . insulin aspart (novoLOG) injection 0-15 Units  0-15 Units Subcutaneous TID WC Isaiah Serge, NP   8 Units at 10/07/20 [phone]  . insulin aspart (novoLOG) injection 0-5 Units  0-5 Units Subcutaneous QHS Isaiah Serge, NP   4 Units at 10/06/20 2319  . insulin aspart (novoLOG) injection 5 Units  5 Units Subcutaneous TID WC Lyda Jester M, PA-C   5 Units at 10/06/20 1740  . insulin NPH Human (NOVOLIN N) injection 15 Units  15 Units Subcutaneous QHS Consuelo Pandy, [place]   15 Units at 10/06/20 2319  . milrinone (PRIMACOR) 20 MG/100 ML (0.2 mg/mL) infusion  0.125 mcg/kg/min  Intravenous Continuous Clegg, Amy D, NP 3.86 mL/hr at 10/07/20 0843 0.125 mcg/kg/min at 10/07/20 0843  . nystatin (MYCOSTATIN/NYSTOP) topical powder   Topical BID Consuelo Pandy, PA-C   Given at 10/07/20 6440  . ondansetron (ZOFRAN) injection 4 mg  4 mg Intravenous Q6H PRN Isaiah Serge, NP      . pantoprazole (PROTONIX) EC tablet 40 mg  40 mg Oral BID Isaiah Serge, NP   40 mg at 10/07/20 0844  . pravastatin (PRAVACHOL) tablet 80 mg  80 mg Oral QPM Isaiah Serge, NP   80 mg at 10/06/20 1740  . risperiDONE (RISPERDAL) tablet 0.25 mg  0.25 mg Oral QHS PRN Dion Body, MD   0.25 mg at 10/07/20 0125  . sodium chloride flush (NS) 0.9 % injection 10-40 mL  10-40 mL Intracatheter Q12H Sueanne Margarita, MD   10 mL at 10/06/20 2320  . sodium chloride flush (NS) 0.9 % injection 10-40 mL  10-40 mL Intracatheter PRN Turner, Traci R, MD      . sodium chloride flush (NS) 0.9 % injection 3 mL  3 mL Intravenous Q12H [place],  Otilio Carpen, NP   3 mL at 10/06/20 2320  . sodium chloride flush (NS) 0.9 % injection 3 mL  3 mL Intravenous PRN Isaiah Serge, NP      . tamsulosin (FLOMAX) capsule 0.4 mg  0.4 mg Oral QPC supper Isaiah Serge, NP   0.4 mg at 10/06/20 1740     Discharge Medications: Please see discharge summary for a list of discharge medications.  Relevant Imaging Results:  Relevant Lab Results:   Additional Information [id number] patient has received both COVID vaccines as well as booster  Trula Ore, LCSWA

## 2020-10-07 NOTE — Progress Notes (Signed)
  Echocardiogram Echocardiogram Transesophageal has been performed.  Fidel Levy 10/07/2020, 2:19 PM

## 2020-10-07 NOTE — Progress Notes (Addendum)
Patient ID: Todd Romero, male   DOB: 11-08-1933, 87 y.o.   MRN: [id number]     Advanced Heart Failure Rounding Note  PCP-Cardiologist: Sinclair Grooms, MD   Subjective:    IV Lasix restarted yesterday. On Lasix gtt at 12 hr. Also started on empiric milrinone, 0.25.   Co-ox yesterday 65% (on milrinone). Repeat co-ox for today pending.   ~1300 cc in UOP yesterday (~800 overnight). BMP pending. CVP 7-8   On Ceftriaxone for UTI. WBC down-trending, 19>>16>>14. AF. UC pending. BC NGTD.   Feels tired and weak. No dyspnea. Wife at bedside.   Device interrogation: 34% RV pacing.  Atrial fibrillation since 09/07/19   Echo EF 30-35% RV mildly reduced TAVR ok Personally reviewed   Objective:   Weight Range: 103 kg Body mass index is 35.57 kg/m.   Vital Signs:   Temp:  [99.5 F (37.5 C)] 99.5 F (37.5 C) (01/31 2124) Pulse Rate:  [80] 80 (01/31 2124) Resp:  [18] 18 (01/31 2124) BP: (123)/(68) 123/68 (01/31 2124) SpO2:  [98 %-99 %] 99 % (01/31 2124) FiO2 (%):  [28 %] 28 % (01/31 0846) Last BM Date: 10/05/20  Weight change: Filed Weights   10/03/20 0534 10/04/20 0447 10/06/20 0631  Weight: 104.3 kg 101.4 kg 103 kg    Intake/Output:   Intake/Output Summary (Last 24 hours) at 10/07/2020 0711 Last data filed at 10/06/2020 2320 Gross per 24 hour  Intake 724.1 ml  Output 580 ml  Net 144.1 ml      Physical Exam    CVP 7-8  General:  fatigue appearing elderly WM. No respiratory difficulty HEENT: normal Neck: supple. JVD 8 cm. Carotids 2+ bilat; no bruits. No lymphadenopathy or thyromegaly appreciated. Cor: PMI nondisplaced. Irregularly irregular rhythm. No rubs, gallops or murmurs. Lungs: decreased BS at bases bilaterally  Abdomen: soft, nontender, nondistended. No hepatosplenomegaly. No bruits or masses. Good bowel sounds. Extremities: no cyanosis, clubbing, rash, trace bilateral LE edema + bilateral unna boots  Neuro: alert & oriented x 3, cranial nerves grossly  intact. moves all 4 extremities w/o difficulty. Affect pleasant. GU + Condom cath   Telemetry   Atrial fibrillation 60-70s w/ PVCs Personally reviewed  Labs    CBC Recent Labs    10/06/20 0213 10/06/20 1430  WBC 19.2* 16.7*  HGB 10.5* 10.6*  HCT 32.4* 31.7*  MCV 90.8 89.8  PLT 150 882*   Basic Metabolic Panel Recent Labs    10/05/20 0419 10/06/20 0213  NA 134* 131*  K 3.2* 3.8  CL 92* 90*  CO2 30 26  GLUCOSE 155* 323*  BUN 63* 71*  CREATININE 2.06* 2.33*  CALCIUM 8.7* 8.9   Liver Function Tests No results for input(s): AST, ALT, ALKPHOS, BILITOT, PROT, ALBUMIN in the last 72 hours. No results for input(s): LIPASE, AMYLASE in the last 72 hours. Cardiac Enzymes No results for input(s): CKTOTAL, CKMB, CKMBINDEX, TROPONINI in the last 72 hours.  BNP: BNP (last 3 results) Recent Labs    10/24/19 1913 10/01/20 1710  BNP 125.0* 433.6*    ProBNP (last 3 results) Recent Labs    11/01/19 1221 11/30/19 1342 01/30/20 1351  PROBNP 2,476* 2,277* 1,776*     D-Dimer No results for input(s): DDIMER in the last 72 hours. Hemoglobin A1C No results for input(s): HGBA1C in the last 72 hours. Fasting Lipid Panel No results for input(s): CHOL, HDL, LDLCALC, TRIG, CHOLHDL, LDLDIRECT in the last 72 hours. Thyroid Function Tests No results for input(s): TSH, T4TOTAL,  T3FREE, THYROIDAB in the last 72 hours.  Invalid input(s): FREET3  Other results:   Imaging    DG Chest 2 View  Result Date: 10/06/2020 CLINICAL DATA:  Dyspnea, weakness EXAM: CHEST - 2 VIEW COMPARISON:  10/01/2020 FINDINGS: Pulmonary insufflation is normal and symmetric. The lungs are clear. No pneumothorax or pleural effusion. Transcatheter aortic valve replacement has been performed. Mild cardiomegaly is stable. Left subclavian dual lead pacemaker is unchanged. Pulmonary vascularity is normal. Degenerative changes are noted within the right shoulder. IMPRESSION: No active cardiopulmonary disease.   Stable cardiomegaly. Electronically Signed   By: Fidela Salisbury MD   On: 10/06/2020 08:49   DG CHEST PORT 1 VIEW  Result Date: 10/06/2020 CLINICAL DATA:  PICC line placement EXAM: PORTABLE CHEST 1 VIEW COMPARISON:  10/06/2020 0830 hours FINDINGS: Interval placement of right upper extremity PICC line with distal tip terminating level of the proximal right atrium. The left-sided implanted cardiac device. Aortic valve replacement. Stable cardiomegaly. Atherosclerotic calcification of the aortic knob. Mild bibasilar atelectasis. No pneumothorax. IMPRESSION: Interval placement of right upper extremity PICC line with distal tip terminating at the level of the proximal right atrium. No pneumothorax. Electronically Signed   By: Davina Poke D.O.   On: 10/06/2020 12:31   [place] EKG SITE RITE  Result Date: 10/06/2020 If Site Rite image not attached, placement could not be confirmed due to current cardiac rhythm.    Medications:     Scheduled Medications: . amiodarone  200 mg Oral Daily  . apixaban  2.5 mg Oral BID  . Chlorhexidine Gluconate Cloth  6 each Topical Daily  . ferrous sulfate  325 mg Oral BID WC  . Gerhardt's butt cream   Topical Daily  . insulin aspart  0-15 Units Subcutaneous TID WC  . insulin aspart  0-5 Units Subcutaneous QHS  . insulin aspart  5 Units Subcutaneous TID WC  . insulin NPH Human  15 Units Subcutaneous QHS  . nystatin   Topical BID  . pantoprazole  40 mg Oral BID  . pravastatin  80 mg Oral QPM  . sodium chloride flush  10-40 mL Intracatheter Q12H  . sodium chloride flush  3 mL Intravenous Q12H  . tamsulosin  0.4 mg Oral QPC supper    Infusions: . sodium chloride    . sodium chloride Stopped (10/06/20 0918)  . sodium chloride    . cefTRIAXone (ROCEPHIN)  IV 1 g (10/06/20 1615)  . furosemide (LASIX) 200 mg in dextrose 5% 100 mL (2mg /mL) infusion 12 mg/hr (10/06/20 0918)  . milrinone 0.25 mcg/kg/min (10/06/20 2328)    PRN Medications: sodium chloride,  acetaminophen, docusate sodium, ondansetron (ZOFRAN) IV, oxyCODONE-acetaminophen, risperiDONE, sodium chloride flush, sodium chloride flush   Assessment/Plan   1. Acute on chronic systolic CHF:  Last echo in 1/21 with EF 35-40%, moderate LVH. Etiology uncertain, last cath in 2018 pre-TAVR with occluded D2 and 90% stenosis left PDA. He had MDT PPM for symptomatic bradycardia in 2/21 (after last echo).  He is RV pacing 34% of the time on device interrogation this admission. Additionally, he has been in atrial fibrillation since the beginning of January.  Echo  10/03/20 EF 30-35% RV mildly reduced TAVR ok Personally reviewed. Good response to IV Lasix. Volume status improved. CVP 7-8 today. On milrinone 0.25. Co-ox pending. BMP pending - Stop Lasix gtt and transition back to PO diuretics, likely torsemide (was on Lasix 80 mg bid PTA) - Continue Unna boots.  - Off hydralazine/Imdur with soft BP.   -  No Entresto, spironolactone yet with marginal renal function.  - He did not tolerate [place] well as outpatient, ?eventually rechallenge with SGLT2 inhibitor.  - Consider cardiac amyloidosis workup - 34% RV pacing may be worsening CHF, will need to eventually discuss CRT upgrade with EP.  - Suspect atrial fibrillation also worsening CHF, plan DCCV today. Stop milrinone prior to DCCV.  - Cannot rule out progressive CAD as cause of worsening CHF, but would not do coronary angiography in absence of ACS without improvement in creatinine.  2. CKD stage IV: Creatinine 2.25 -> 2.33-> pending  - Transition to PO diuretics today - Follow BMP  3. Atrial fibrillation: History of PAF on amiodarone.  He has been in atrial fibrillation since early January.  - Continue Eliquis, he missed a dose around the time of admission.  - Continue amiodarone 200 mg daily pre-DCCV.  - Plan TEE-guided DCCV today. Stop Milrinone prior to procedure.  4. CAD: Cath in 2018 pre-TAVR with occluded D2 and 90% stenosis left PDA.  This was  stable compared to prior cath and managed medically.  No chest pain or ACS this admission.  - Cannot rule out progressive CAD as cause of worsening CHF, but would not do coronary angiography in absence of ACS without improvement in creatinine.  - Continue statin.  - No ASA given Eliquis use.  5. OSA: Severe, cannot tolerate CPAP.  6. H/o TAVR: Valve stable on last echo.  - stable on repeat echo this admission.  7. Acute delirium/sundowning - worsened with Lorrin Mais (now listed as allergy) - ABG without CO2 retention.  - improved - can try risperadal 2.5 prn for agitation at night 8. Physical deconditioning - very weak. Continue to mobilize w/ PT/OT - He will need SNF. Will place [facility] consult to assist w/ placement 9. UTI - WBC down-trending. AF.  - Continue ceftriaxone  - UC pending. BC NGTD   Length of Stay: [address], PA-C  10/07/2020, 7:11 AM  Advanced Heart Failure Team Pager [phone] (M-F; 7a - 4p)  Please contact [facility] for night-coverage after hours (4p -7a ) and weekends on [email or web address]  Patient seen with PA, agree with the above note.   UTI with GNRs in urine, started ceftriaxone yesterday.    Awake and alert this morning, had "pain all over" last night, was given Percocet.   Not much UOP recorded, looks like he has 800 cc or so in foley back (not sure I/Os being accurately recorded).  Creatinine up to 2.95.  CVP 8 on my read.  Co-ox 73%.    Able to walk with PT but needed assistance, recommend SNF.   General: NAD Neck: JVP 8 cm, no thyromegaly or thyroid nodule.  Lungs: Clear to auscultation bilaterally with normal respiratory effort. CV: Nondisplaced PMI.  Heart irregular S1/S2, no S3/S4, 1/6 SEM RUSB.  1+ ankle edema.   Abdomen: Soft, nontender, no hepatosplenomegaly, no distention.  Skin: Intact without lesions or rashes.  Neurologic: Alert and oriented x 3.  Psych: Normal affect. Extremities: No clubbing or cyanosis.  HEENT: Normal. '  AKI,  CVP down to 8.  Stop Lasix gtt.  Will hold diuretics for now.    Good co-ox on milrinone 0.25, decrease to 0.125 today.  Will try to continue this dose today to promote renal perfusion.   Continue ceftriaxone for UTI pending sensitivities.   Plan for TEE-guided DCCV today, continue amiodarone and apixaban.   [name] 10/07/2020 8:31 AM

## 2020-10-08 ENCOUNTER — Inpatient Hospital Stay (HOSPITAL_COMMUNITY): Payer: Medicare Other | Admitting: Certified Registered Nurse Anesthetist

## 2020-10-08 ENCOUNTER — Encounter (HOSPITAL_COMMUNITY): Payer: Self-pay | Admitting: Cardiology

## 2020-10-08 ENCOUNTER — Encounter (HOSPITAL_COMMUNITY)
Admission: EM | Disposition: A | Discharge status: Skilled Nursing Facility | Payer: Self-pay | Source: Home / Self Care | Attending: Cardiology

## 2020-10-08 DIAGNOSIS — I5043 Acute on chronic combined systolic (congestive) and diastolic (congestive) heart failure: Secondary | ICD-10-CM | POA: Diagnosis not present

## 2020-10-08 LAB — BASIC METABOLIC PANEL
Anion gap: 12 (ref 5–15)
BUN: 94 mg/dL — ABNORMAL HIGH (ref 8–23)
CO2: 26 mmol/L (ref 22–32)
Calcium: 8 mg/dL — ABNORMAL LOW (ref 8.9–10.3)
Chloride: 88 mmol/L — ABNORMAL LOW (ref 98–111)
Creatinine, Ser: 2.94 mg/dL — ABNORMAL HIGH (ref 0.61–1.24)
GFR, Estimated: 20 mL/min — ABNORMAL LOW (ref >60)
Glucose, Bld: 386 mg/dL — ABNORMAL HIGH (ref 70–99)
Potassium: 3.7 mmol/L (ref 3.5–5.1)
Sodium: 126 mmol/L — ABNORMAL LOW (ref 135–145)

## 2020-10-08 LAB — COOXEMETRY PANEL
Carboxyhemoglobin: 1.1 % (ref 0.5–1.5)
Methemoglobin: 1.1 % (ref 0.0–1.5)
O2 Saturation: 81.3 %
Total hemoglobin: 8.9 g/dL — ABNORMAL LOW (ref 12.0–16.0)

## 2020-10-08 LAB — CBC
HCT: 26.3 % — ABNORMAL LOW (ref 39.0–52.0)
Hemoglobin: 8.9 g/dL — ABNORMAL LOW (ref 13.0–17.0)
MCH: 29.9 pg (ref 26.0–34.0)
MCHC: 33.8 g/dL (ref 30.0–36.0)
MCV: 88.3 fL (ref 80.0–100.0)
Platelets: 156 10*3/uL (ref 150–400)
RBC: 2.98 MIL/uL — ABNORMAL LOW (ref 4.22–5.81)
RDW: 15.4 % (ref 11.5–15.5)
WBC: 9.2 10*3/uL (ref 4.0–10.5)
nRBC: 0 % (ref 0.0–0.2)

## 2020-10-08 LAB — GLUCOSE, CAPILLARY
Glucose-Capillary: 297 mg/dL — ABNORMAL HIGH (ref 70–99)
Glucose-Capillary: 254 mg/dL — ABNORMAL HIGH (ref 70–99)
Glucose-Capillary: 315 mg/dL — ABNORMAL HIGH (ref 70–99)
Glucose-Capillary: 334 mg/dL — ABNORMAL HIGH (ref 70–99)

## 2020-10-08 LAB — URINE CULTURE: Culture: >=100000 — AB

## 2020-10-08 SURGERY — CANCELLED PROCEDURE
Anesthesia: General

## 2020-10-08 MED ORDER — INSULIN NPH (HUMAN) (ISOPHANE) 100 UNIT/ML ~~LOC~~ SUSP
18.0000 [IU] | Freq: Every day | SUBCUTANEOUS | Status: DC
  Administered 2020-10-08 – 2020-10-09 (×2): 18 [IU] via SUBCUTANEOUS
  Filled 2020-10-08: qty 10

## 2020-10-08 MED ORDER — ALPRAZOLAM 0.25 MG PO TABS
0.2500 mg | ORAL_TABLET | Freq: Two times a day (BID) | ORAL | Status: DC | PRN
  Administered 2020-10-08 – 2020-10-13 (×5): 0.25 mg via ORAL
  Filled 2020-10-08 (×7): qty 1

## 2020-10-08 MED ORDER — FUROSEMIDE 10 MG/ML IJ SOLN
80.0000 mg | Freq: Once | INTRAMUSCULAR | Status: AC
  Administered 2020-10-08: 80 mg via INTRAVENOUS
  Filled 2020-10-08: qty 8

## 2020-10-08 MED ORDER — POTASSIUM CHLORIDE CRYS ER 20 MEQ PO TBCR
20.0000 meq | EXTENDED_RELEASE_TABLET | Freq: Once | ORAL | Status: AC
  Administered 2020-10-08: 20 meq via ORAL
  Filled 2020-10-08: qty 1

## 2020-10-08 MED ORDER — TRAZODONE HCL 50 MG PO TABS
50.0000 mg | ORAL_TABLET | Freq: Every day | ORAL | Status: DC
Start: 2020-10-08 — End: 2020-10-14
  Administered 2020-10-08 – 2020-10-13 (×2): 50 mg via ORAL
  Filled 2020-10-08 (×6): qty 1

## 2020-10-08 NOTE — Progress Notes (Signed)
Physical Therapy Treatment Patient Details Name: Todd Romero MRN: [id number] DOB: [birth year] Today's Date: 10/08/2020    History of Present Illness Pt with acute on chronic systolic chf. PMH - pacer, TAVR, ckd, afib, cad, TKR, DM    PT Comments    Pt able to recognize that purple scrubs indicate therapy is in the room. He is fatigued but agreeable to try. Pt was able to progress to recliner chair, however unable to lift feet off the ground to perform gait. Once in recliner chair, attempted 2 additional stands, however pt unable to rise from recliner chair with max A secondary to fatigue. Pt wife present for session and assisting in peri care prior to transfer. Session performed on RA with SpO2 remaining >97%. Will continue to follow acutely for mobility progression.     Follow Up Recommendations  SNF     Equipment Recommendations  None recommended by PT    Recommendations for Other Services       Precautions / Restrictions Precautions Precautions: Fall Restrictions Weight Bearing Restrictions: No    Mobility  Bed Mobility Overal bed mobility: Needs Assistance Bed Mobility: Sidelying to Sit;Rolling Rolling: Mod assist Sidelying to sit: Mod assist       General bed mobility comments: mod A with mod cues for technique to roll and elevate trunk.  Transfers Overall transfer level: Needs assistance Equipment used: Rolling walker (2 wheeled) Transfers: Sit to/from [facility] Sit to Stand: Mod assist Stand pivot transfers: Min assist       General transfer comment: mod A to rise from EOB with cues for hand placement. Assist for balance and power up. Slow transfer to recliner chair with pt wiggling feet across floor as he is unable to pick up feet at this time. Attempted additional sit<>stand from the recliner chair, however pt unable to rise again secondary to fatigue.  Ambulation/Gait             General Gait Details: attempted marching in  place at recliner chair, however pt unable to lift LE off floor and performing more of a weight shift.   Stairs             Wheelchair Mobility    Modified Rankin (Stroke Patients Only)       Balance Overall balance assessment: Needs assistance Sitting-balance support: Bilateral upper extremity supported;Feet supported Sitting balance-Leahy Scale: Poor Sitting balance - Comments: UE support   Standing balance support: Bilateral upper extremity supported Standing balance-Leahy Scale: Poor Standing balance comment: walker and min assist for static standing                            Cognition Arousal/Alertness: Awake/alert Behavior During Therapy: WFL for tasks assessed/performed Overall Cognitive Status: Impaired/Different from baseline Area of Impairment: Memory;Following commands;Problem solving                     Memory: Decreased short-term memory Following Commands: Follows one step commands consistently;Follows one step commands with increased time     Problem Solving: Slow processing;Requires verbal cues General Comments: Possibly HOH. Wife reports husband has said some unusual things that did not make since while he's been in the hospital.      Exercises General Exercises - Lower Extremity Long Arc Quad: AROM;Both;10 reps Heel Slides: AAROM;Both;10 reps;Seated Hip ABduction/ADduction: AROM;Both;10 reps;Seated Hip Flexion/Marching: AROM;Both;10 reps;Seated    General Comments General comments (skin integrity, edema, etc.): Back itching with minor  skin irritation visible.      Pertinent Vitals/Pain Pain Assessment: No/denies pain    Home Living                      Prior Function            PT Goals (current goals can now be found in the care plan section) Acute Rehab PT Goals Patient Stated Goal: get stronger PT Goal Formulation: With patient/family Time For Goal Achievement: 10/20/20 Potential to Achieve Goals:  Good Progress towards PT goals: Progressing toward goals    Frequency    Min 3X/week      PT Plan Current plan remains appropriate    Co-evaluation              AM-PAC PT "6 Clicks" Mobility   Outcome Measure  Help needed turning from your back to your side while in a flat bed without using bedrails?: A Lot Help needed moving from lying on your back to sitting on the side of a flat bed without using bedrails?: A Lot Help needed moving to and from a bed to a chair (including a wheelchair)?: A Lot Help needed standing up from a chair using your arms (e.g., wheelchair or bedside chair)?: A Lot Help needed to walk in hospital room?: A Lot Help needed climbing 3-5 steps with a railing? : Total 6 Click Score: 11    End of Session Equipment Utilized During Treatment: Gait belt Activity Tolerance: Patient tolerated treatment well Patient left: in chair;with call bell/phone within reach;with family/visitor present Nurse Communication: Mobility status PT Visit Diagnosis: Other abnormalities of gait and mobility (R26.89);Unsteadiness on feet (R26.81);Muscle weakness (generalized) (M62.81)     Time: 2952-8413 PT Time Calculation (min) (ACUTE ONLY): 38 min  Charges:  $Therapeutic Exercise: 8-22 mins $Therapeutic Activity: 23-37 mins                    Benjiman Core, [place] Pager [phone] Acute Rehab  Allena Katz 10/08/2020, 11:15 AM

## 2020-10-08 NOTE — Progress Notes (Signed)
Pacemaker interrogated by rep, NSR, case canceled

## 2020-10-08 NOTE — Anesthesia Preprocedure Evaluation (Deleted)
Anesthesia Evaluation  Patient identified by MRN, date of birth, ID band Patient awake    Reviewed: Allergy & Precautions, NPO status , Patient's Chart, lab work & pertinent test results  History of Anesthesia Complications Negative for: history of anesthetic complications  Airway Mallampati: III  TM Distance: >3 FB Neck ROM: Full    Dental  (+) Dental Advisory Given   Pulmonary sleep apnea and Continuous Positive Airway Pressure Ventilation ,    Pulmonary exam normal        Cardiovascular hypertension, + CAD and +CHF  + dysrhythmias Atrial Fibrillation + pacemaker + Valvular Problems/Murmurs (s/p TAVR)  Rhythm:Irregular Rate:Normal   TEE - EF 25-30%, diffuse hypokinesis, mildly decreased RV systolic function, bioprosthetic aortic valve s/p TAVR mean gradient 9 mmHg without significant AI.     Neuro/Psych negative neurological ROS  negative psych ROS   GI/Hepatic negative GI ROS, Neg liver ROS,   Endo/Other  diabetes, Poorly Controlled, Type 2, Insulin Dependent, Oral Hypoglycemic Agents Obesity Hyponatremia, Na 126 Hypocalcemia, Ca 8.0 Hypochloremia, Cl 88   Renal/GU CRFRenal disease     Musculoskeletal  (+) Arthritis ,   Abdominal   Peds  Hematology  (+) anemia ,  INR 1.7 On eliquis    Anesthesia Other Findings Covid test negative 10/01/20    Reproductive/Obstetrics                            Anesthesia Physical Anesthesia Plan  ASA: IV  Anesthesia Plan: General   Post-op Pain Management:    Induction: Intravenous  PONV Risk Score and Plan: 2 and Treatment may vary due to age or medical condition and Propofol infusion  Airway Management Planned: Mask and Natural Airway  Additional Equipment: None  Intra-op Plan:   Post-operative Plan:   Informed Consent: I have reviewed the patients History and Physical, chart, labs and discussed the procedure including the risks,  benefits and alternatives for the proposed anesthesia with the patient or authorized representative who has indicated his/her understanding and acceptance.       Plan Discussed with: CRNA and Anesthesiologist  Anesthesia Plan Comments:        Anesthesia Quick Evaluation

## 2020-10-08 NOTE — Interval H&P Note (Signed)
History and Physical Interval Note:  10/08/2020 1:40 PM  Todd Romero  has presented today for surgery, with the diagnosis of afib.  The various methods of treatment have been discussed with the patient and family. After consideration of risks, benefits and other options for treatment, the patient has consented to  Procedure(s): CARDIOVERSION (N/A) as a surgical intervention.  The patient's history has been reviewed, patient examined, no change in status, stable for surgery.  I have reviewed the patient's chart and labs.  Questions were answered to the patient's satisfaction.     Malvern Kadlec [employer]

## 2020-10-08 NOTE — TOC Progression Note (Addendum)
Transition of Care [facility]) - Progression Note    Patient Details  Name: Todd Romero MRN: [id number] Date of Birth: [birth year]  Transition of Care [facility]) CM/SW [place], [place] Phone Number: 10/08/2020, 11:46 AM  Clinical Narrative:     Update 2/2 12:09pm- CSW spoke with  Misty with [facility] who confirmed that she can offer a SNF bed for patient. CSW called patients spouse and patients spouse accepted bed offer. CSW will start insurance authorization closer to patient being medically ready.  CSW will continue to follow. CSW spoke with patient and patients spouse at bedside and provided SNF bed offers. CSW gave patients spouse medicare compare rating list of current bed offers to review. CSW awaiting callback from [facility] to see if they can offer SNF bed for patient. CSW let patient and spouse know that CSW will call patients spouse as soon as CSW hears back from [employer].  CSW will continue to follow.    Expected Discharge Plan: [place] Barriers to Discharge: Continued Medical Work up  Expected Discharge Plan and Services Expected Discharge Plan: [place] arrangements for the past 2 months: Single Family Home                                       Social Determinants of Health (SDOH) Interventions    Readmission Risk Interventions No flowsheet data found.

## 2020-10-08 NOTE — Progress Notes (Signed)
Inpatient Diabetes Program Recommendations  AACE/ADA: New Consensus Statement on Inpatient Glycemic Control  Target Ranges:  Prepandial:   less than 140 mg/dL      Peak postprandial:   less than 180 mg/dL (1-2 hours)      Critically ill patients:  140 - 180 mg/dL   Results for Todd Romero, Todd Romero (MRN [id number]) as of 10/08/2020 12:28  Ref. Range 10/07/2020 07:34 10/07/2020 11:53 10/07/2020 16:52 10/07/2020 21:36 10/08/2020 07:46 10/08/2020 11:57  Glucose-Capillary Latest Ref Range: 70 - 99 mg/dL 272 (H) 246 (H) 343 (H) 314 (H) 297 (H) 254 (H)   Review of Glycemic Control  Home DM Meds:Amaryl 2 mg daily, Humalog8units with breakfast if glucose >100 mg/dl, Humalog 8-16units withlunch and supper, NPH 26 units QHS, Victoza 1.8 mg daily   Current Orders:NPH 15 units QHS, Novolog 0-15 units TID with meals, Novolog 0-5 units QHS,  Novolog 5 units TID with meals for meal coverage  Inpatient Diabetes Program Recommendations:    Insulin: Please consider changing NPH to 15 units BID.  Thanks, Barnie Alderman, RN, MSN, CDE Diabetes Coordinator Inpatient Diabetes Program [phone] (Team Pager from 8am to 5pm)

## 2020-10-08 NOTE — H&P (View-Only) (Signed)
Patient ID: Nalin Mazzocco Rummell, male   DOB: 1933-11-18, 87 y.o.   MRN: [id number]     Advanced Heart Failure Rounding Note  PCP-Cardiologist: Sinclair Grooms, MD   Subjective:    Did not sleep well.  Hurts "all over."  CVP 13 this morning, creatinine 2.94, stable.  Milrinone stopped this morning (co-ox 81%) in preparation for re-attempt DCCV.  He is on amiodarone gtt.    Blood cultures with Enterobacter and Klebsiella, urine culture with Enterobacter.  CXR 1/31 without PNA.  Afebrile, WBCs lower at 9.2 today.  He is on cefepime.   Having difficulty sleeping at night and agitated at times.  Risperdal given last night, increased his agitation.    Device interrogation: 34% RV pacing.  Atrial fibrillation since 09/07/19.  TEE-guided DCCV on 2/1 back to NSR but reverted to atrial fibrillation.   TEE: EF 25-30%, diffuse hypokinesis, mildly decreased RV systolic function, bioprosthetic aortic valve s/p TAVR mean gradient 9 mmHg without significant AI.    Objective:   Weight Range: 103.1 kg Body mass index is 35.6 kg/m.   Vital Signs:   Temp:  [97.7 F (36.5 C)-98.5 F (36.9 C)] 98.5 F (36.9 C) (02/02 0749) Pulse Rate:  [67-76] 67 (02/02 0749) Resp:  [16-22] 18 (02/02 0749) BP: (87-142)/(40-64) 120/64 (02/02 0749) SpO2:  [94 %-100 %] 97 % (02/01 2121) Weight:  [103 kg-103.1 kg] 103.1 kg (02/02 0500) Last BM Date: 10/05/20  Weight change: Filed Weights   10/06/20 0631 10/07/20 1147 10/08/20 0500  Weight: 103 kg 103 kg 103.1 kg    Intake/Output:   Intake/Output Summary (Last 24 hours) at 10/08/2020 0933 Last data filed at 10/07/2020 2133 Gross per 24 hour  Intake 103 ml  Output 700 ml  Net -597 ml      Physical Exam    CVP 13  General: NAD Neck: JVP 12-14 cm, no thyromegaly or thyroid nodule.  Lungs: Clear to auscultation bilaterally with normal respiratory effort. CV: Nondisplaced PMI.  Heart irregular S1/S2, no S3/S4, no murmur.  1+ ankle edema.  Abdomen: Soft,  nontender, no hepatosplenomegaly, no distention.  Skin: Intact without lesions or rashes.  Neurologic: Alert and oriented x 3.  Psych: Normal affect. Extremities: No clubbing or cyanosis.  HEENT: Normal.    Telemetry   Atrial fibrillation 70s. Personally reviewed  Labs    CBC Recent Labs    10/07/20 0500 10/08/20 0530  WBC 14.2* 9.2  HGB 9.2* 8.9*  HCT 27.0* 26.3*  MCV 88.5 88.3  PLT 147* 341   Basic Metabolic Panel Recent Labs    10/07/20 0500 10/08/20 0530  NA 131* 126*  K 3.6 3.7  CL 92* 88*  CO2 27 26  GLUCOSE 286* 386*  BUN 87* 94*  CREATININE 2.95* 2.94*  CALCIUM 8.2* 8.0*  MG 1.9  --    Liver Function Tests No results for input(s): AST, ALT, ALKPHOS, BILITOT, PROT, ALBUMIN in the last 72 hours. No results for input(s): LIPASE, AMYLASE in the last 72 hours. Cardiac Enzymes No results for input(s): CKTOTAL, CKMB, CKMBINDEX, TROPONINI in the last 72 hours.  BNP: BNP (last 3 results) Recent Labs    10/24/19 1913 10/01/20 1710  BNP 125.0* 433.6*    ProBNP (last 3 results) Recent Labs    11/01/19 1221 11/30/19 1342 01/30/20 1351  PROBNP 2,476* 2,277* 1,776*     D-Dimer No results for input(s): DDIMER in the last 72 hours. Hemoglobin A1C No results for input(s): HGBA1C in the last  72 hours. Fasting Lipid Panel No results for input(s): CHOL, HDL, LDLCALC, TRIG, CHOLHDL, LDLDIRECT in the last 72 hours. Thyroid Function Tests No results for input(s): TSH, T4TOTAL, T3FREE, THYROIDAB in the last 72 hours.  Invalid input(s): FREET3  Other results:   Imaging    No results found.   Medications:     Scheduled Medications: . apixaban  2.5 mg Oral BID  . Chlorhexidine Gluconate Cloth  6 each Topical Daily  . ferrous sulfate  325 mg Oral BID WC  . furosemide  80 mg Intravenous Once  . Gerhardt's butt cream   Topical Daily  . insulin aspart  0-15 Units Subcutaneous TID WC  . insulin aspart  0-5 Units Subcutaneous QHS  . insulin aspart   5 Units Subcutaneous TID WC  . insulin NPH Human  15 Units Subcutaneous QHS  . nystatin   Topical BID  . pantoprazole  40 mg Oral BID  . potassium chloride  20 mEq Oral Once  . pravastatin  80 mg Oral QPM  . sodium chloride flush  10-40 mL Intracatheter Q12H  . sodium chloride flush  3 mL Intravenous Q12H  . tamsulosin  0.4 mg Oral QPC supper    Infusions: . sodium chloride    . amiodarone 30 mg/hr (10/07/20 2125)  . ceFEPime (MAXIPIME) IV 2 g (10/07/20 2129)    PRN Medications: sodium chloride, acetaminophen, docusate sodium, ondansetron (ZOFRAN) IV, risperiDONE, sodium chloride flush, sodium chloride flush   Assessment/Plan   1. Acute on chronic systolic CHF:  Last echo in 1/21 with EF 35-40%, moderate LVH. Etiology uncertain, last cath in 2018 pre-TAVR with occluded D2 and 90% stenosis left PDA. He had MDT PPM for symptomatic bradycardia in 2/21 (after last echo).  He is RV pacing 34% of the time on device interrogation this admission. Additionally, he has been in atrial fibrillation since the beginning of January.  Echo  10/03/20 EF 30-35% RV mildly reduced TAVR ok.  TEE (2/1) with EF 25-30%, mildly decreased RV systolic function, normal function of TAVR valve. TEE-DCCV 2/1 with conversion to NSR but then back to atrial fibrillation.  IV Lasix stopped yesterday with CVP 8 and rise in creatinine.  Milrinone stopped this morning.  Co-ox 81% with CVP 13. Volume overload on exam.  - Lasix 80 mg IV x 1 this morning.  - Suspect atrial fibrillation worsening CHF, plan re-attempt DCCV today now that he is off milrinone.  - Continue Unna boots.  - Off hydralazine/Imdur with soft BP.   - No Entresto, spironolactone with AKI on CKD 4.   - He did not tolerate [place] well as outpatient, ?eventually rechallenge with SGLT2 inhibitor.  - Consider cardiac amyloidosis workup.  - 34% RV pacing may be worsening CHF, will need to eventually discuss CRT upgrade with EP. Not candidate at this time with  bacteremia.  - Cannot rule out progressive CAD as cause of worsening CHF, but would not do coronary angiography in absence of ACS without improvement in creatinine.  2. AKI on CKD stage IV: Creatinine stable at 2.94 today but up from baseline.  - 1 dose of IV Lasix today with higher CVP.   3. Atrial fibrillation: History of PAF on amiodarone.  He has been in atrial fibrillation since early January. TEE-DCCV on 2/1 initially converted him to NSR but back to atrial fibrillation soon after.  He was still on milrinone 0.125.  - Continue amiodarone gtt.  - Now off milrinone.  - Continue Eliquis.  -  Plan DCCV later today off milrinone.  4. CAD: Cath in 2018 pre-TAVR with occluded D2 and 90% stenosis left PDA.  This was stable compared to prior cath and managed medically.  No chest pain or ACS this admission.  - Cannot rule out progressive CAD as cause of worsening CHF, but would not do coronary angiography in absence of ACS without improvement in creatinine.  - Continue statin.  - No ASA given Eliquis use.  5. OSA: Severe, cannot tolerate CPAP.  6. H/o TAVR: Valve stable TEE on 2/1.  7. Acute delirium/sundowning/agitation/poor sleep: Worsened with ambien and Risperdal.  - Will give trazodone 50 mg qhs.  - Can use very low dose Xanax during the day for agitation/anxiety. Has helped in the past.  8. Physical deconditioning: Very weak. Continue to mobilize w/ PT/OT - He will need SNF.  - Needs to get out of bed today.  9. ID: UTI + urosepsis with Enterobacter in urine and blood, Klebsiella in blood.  Recent CXR without PNA.  Urosepsis complicates picture. WBCs trending down on abx and he is afebrile.  - Continue cefepime.   [name] 10/08/2020 9:33 AM

## 2020-10-08 NOTE — Progress Notes (Signed)
Patient ID: Todd Romero, male   DOB: 1934/06/08, 87 y.o.   MRN: [id number]     Advanced Heart Failure Rounding Note  PCP-Cardiologist: Sinclair Grooms, MD   Subjective:    Did not sleep well.  Hurts "all over."  CVP 13 this morning, creatinine 2.94, stable.  Milrinone stopped this morning (co-ox 81%) in preparation for re-attempt DCCV.  He is on amiodarone gtt.    Blood cultures with Enterobacter and Klebsiella, urine culture with Enterobacter.  CXR 1/31 without PNA.  Afebrile, WBCs lower at 9.2 today.  He is on cefepime.   Having difficulty sleeping at night and agitated at times.  Risperdal given last night, increased his agitation.    Device interrogation: 34% RV pacing.  Atrial fibrillation since 09/07/19.  TEE-guided DCCV on 2/1 back to NSR but reverted to atrial fibrillation.   TEE: EF 25-30%, diffuse hypokinesis, mildly decreased RV systolic function, bioprosthetic aortic valve s/p TAVR mean gradient 9 mmHg without significant AI.    Objective:   Weight Range: 103.1 kg Body mass index is 35.6 kg/m.   Vital Signs:   Temp:  [97.7 F (36.5 C)-98.5 F (36.9 C)] 98.5 F (36.9 C) (02/02 0749) Pulse Rate:  [67-76] 67 (02/02 0749) Resp:  [16-22] 18 (02/02 0749) BP: (87-142)/(40-64) 120/64 (02/02 0749) SpO2:  [94 %-100 %] 97 % (02/01 2121) Weight:  [103 kg-103.1 kg] 103.1 kg (02/02 0500) Last BM Date: 10/05/20  Weight change: Filed Weights   10/06/20 0631 10/07/20 1147 10/08/20 0500  Weight: 103 kg 103 kg 103.1 kg    Intake/Output:   Intake/Output Summary (Last 24 hours) at 10/08/2020 0933 Last data filed at 10/07/2020 2133 Gross per 24 hour  Intake 103 ml  Output 700 ml  Net -597 ml      Physical Exam    CVP 13  General: NAD Neck: JVP 12-14 cm, no thyromegaly or thyroid nodule.  Lungs: Clear to auscultation bilaterally with normal respiratory effort. CV: Nondisplaced PMI.  Heart irregular S1/S2, no S3/S4, no murmur.  1+ ankle edema.  Abdomen: Soft,  nontender, no hepatosplenomegaly, no distention.  Skin: Intact without lesions or rashes.  Neurologic: Alert and oriented x 3.  Psych: Normal affect. Extremities: No clubbing or cyanosis.  HEENT: Normal.    Telemetry   Atrial fibrillation 70s. Personally reviewed  Labs    CBC Recent Labs    10/07/20 0500 10/08/20 0530  WBC 14.2* 9.2  HGB 9.2* 8.9*  HCT 27.0* 26.3*  MCV 88.5 88.3  PLT 147* 594   Basic Metabolic Panel Recent Labs    10/07/20 0500 10/08/20 0530  NA 131* 126*  K 3.6 3.7  CL 92* 88*  CO2 27 26  GLUCOSE 286* 386*  BUN 87* 94*  CREATININE 2.95* 2.94*  CALCIUM 8.2* 8.0*  MG 1.9  --    Liver Function Tests No results for input(s): AST, ALT, ALKPHOS, BILITOT, PROT, ALBUMIN in the last 72 hours. No results for input(s): LIPASE, AMYLASE in the last 72 hours. Cardiac Enzymes No results for input(s): CKTOTAL, CKMB, CKMBINDEX, TROPONINI in the last 72 hours.  BNP: BNP (last 3 results) Recent Labs    10/24/19 1913 10/01/20 1710  BNP 125.0* 433.6*    ProBNP (last 3 results) Recent Labs    11/01/19 1221 11/30/19 1342 01/30/20 1351  PROBNP 2,476* 2,277* 1,776*     D-Dimer No results for input(s): DDIMER in the last 72 hours. Hemoglobin A1C No results for input(s): HGBA1C in the last  72 hours. Fasting Lipid Panel No results for input(s): CHOL, HDL, LDLCALC, TRIG, CHOLHDL, LDLDIRECT in the last 72 hours. Thyroid Function Tests No results for input(s): TSH, T4TOTAL, T3FREE, THYROIDAB in the last 72 hours.  Invalid input(s): FREET3  Other results:   Imaging    No results found.   Medications:     Scheduled Medications: . apixaban  2.5 mg Oral BID  . Chlorhexidine Gluconate Cloth  6 each Topical Daily  . ferrous sulfate  325 mg Oral BID WC  . furosemide  80 mg Intravenous Once  . Gerhardt's butt cream   Topical Daily  . insulin aspart  0-15 Units Subcutaneous TID WC  . insulin aspart  0-5 Units Subcutaneous QHS  . insulin aspart   5 Units Subcutaneous TID WC  . insulin NPH Human  15 Units Subcutaneous QHS  . nystatin   Topical BID  . pantoprazole  40 mg Oral BID  . potassium chloride  20 mEq Oral Once  . pravastatin  80 mg Oral QPM  . sodium chloride flush  10-40 mL Intracatheter Q12H  . sodium chloride flush  3 mL Intravenous Q12H  . tamsulosin  0.4 mg Oral QPC supper    Infusions: . sodium chloride    . amiodarone 30 mg/hr (10/07/20 2125)  . ceFEPime (MAXIPIME) IV 2 g (10/07/20 2129)    PRN Medications: sodium chloride, acetaminophen, docusate sodium, ondansetron (ZOFRAN) IV, risperiDONE, sodium chloride flush, sodium chloride flush   Assessment/Plan   1. Acute on chronic systolic CHF:  Last echo in 1/21 with EF 35-40%, moderate LVH. Etiology uncertain, last cath in 2018 pre-TAVR with occluded D2 and 90% stenosis left PDA. He had MDT PPM for symptomatic bradycardia in 2/21 (after last echo).  He is RV pacing 34% of the time on device interrogation this admission. Additionally, he has been in atrial fibrillation since the beginning of January.  Echo  10/03/20 EF 30-35% RV mildly reduced TAVR ok.  TEE (2/1) with EF 25-30%, mildly decreased RV systolic function, normal function of TAVR valve. TEE-DCCV 2/1 with conversion to NSR but then back to atrial fibrillation.  IV Lasix stopped yesterday with CVP 8 and rise in creatinine.  Milrinone stopped this morning.  Co-ox 81% with CVP 13. Volume overload on exam.  - Lasix 80 mg IV x 1 this morning.  - Suspect atrial fibrillation worsening CHF, plan re-attempt DCCV today now that he is off milrinone.  - Continue Unna boots.  - Off hydralazine/Imdur with soft BP.   - No Entresto, spironolactone with AKI on CKD 4.   - He did not tolerate [place] well as outpatient, ?eventually rechallenge with SGLT2 inhibitor.  - Consider cardiac amyloidosis workup.  - 34% RV pacing may be worsening CHF, will need to eventually discuss CRT upgrade with EP. Not candidate at this time with  bacteremia.  - Cannot rule out progressive CAD as cause of worsening CHF, but would not do coronary angiography in absence of ACS without improvement in creatinine.  2. AKI on CKD stage IV: Creatinine stable at 2.94 today but up from baseline.  - 1 dose of IV Lasix today with higher CVP.   3. Atrial fibrillation: History of PAF on amiodarone.  He has been in atrial fibrillation since early January. TEE-DCCV on 2/1 initially converted him to NSR but back to atrial fibrillation soon after.  He was still on milrinone 0.125.  - Continue amiodarone gtt.  - Now off milrinone.  - Continue Eliquis.  -  Plan DCCV later today off milrinone.  4. CAD: Cath in 2018 pre-TAVR with occluded D2 and 90% stenosis left PDA.  This was stable compared to prior cath and managed medically.  No chest pain or ACS this admission.  - Cannot rule out progressive CAD as cause of worsening CHF, but would not do coronary angiography in absence of ACS without improvement in creatinine.  - Continue statin.  - No ASA given Eliquis use.  5. OSA: Severe, cannot tolerate CPAP.  6. H/o TAVR: Valve stable TEE on 2/1.  7. Acute delirium/sundowning/agitation/poor sleep: Worsened with ambien and Risperdal.  - Will give trazodone 50 mg qhs.  - Can use very low dose Xanax during the day for agitation/anxiety. Has helped in the past.  8. Physical deconditioning: Very weak. Continue to mobilize w/ PT/OT - He will need SNF.  - Needs to get out of bed today.  9. ID: UTI + urosepsis with Enterobacter in urine and blood, Klebsiella in blood.  Recent CXR without PNA.  Urosepsis complicates picture. WBCs trending down on abx and he is afebrile.  - Continue cefepime.   [name] 10/08/2020 9:33 AM

## 2020-10-08 NOTE — Progress Notes (Signed)
Patient came down for DCCV.   He was in NSR in endoscopy, confirmed by PPM interrogation.  He has frequent PACs.    Increased a-pacing rate to try to suppress PACs.    No need for DCCV.    Continue amiodarone overnight, convert to po in am.   [name] 10/08/2020 1:59 PM

## 2020-10-09 DIAGNOSIS — I5033 Acute on chronic diastolic (congestive) heart failure: Secondary | ICD-10-CM | POA: Diagnosis not present

## 2020-10-09 DIAGNOSIS — L8990 Pressure ulcer of unspecified site, unspecified stage: Secondary | ICD-10-CM | POA: Insufficient documentation

## 2020-10-09 DIAGNOSIS — I5043 Acute on chronic combined systolic (congestive) and diastolic (congestive) heart failure: Secondary | ICD-10-CM | POA: Diagnosis not present

## 2020-10-09 LAB — COOXEMETRY PANEL
Carboxyhemoglobin: 0.8 % (ref 0.5–1.5)
Methemoglobin: 0.9 % (ref 0.0–1.5)
O2 Saturation: 62.8 %
Total hemoglobin: 9.9 g/dL — ABNORMAL LOW (ref 12.0–16.0)

## 2020-10-09 LAB — GLUCOSE, CAPILLARY
Glucose-Capillary: 258 mg/dL — ABNORMAL HIGH (ref 70–99)
Glucose-Capillary: 196 mg/dL — ABNORMAL HIGH (ref 70–99)
Glucose-Capillary: 260 mg/dL — ABNORMAL HIGH (ref 70–99)
Glucose-Capillary: 259 mg/dL — ABNORMAL HIGH (ref 70–99)

## 2020-10-09 LAB — CULTURE, BLOOD (ROUTINE X 2): Special Requests: ADEQUATE

## 2020-10-09 LAB — BASIC METABOLIC PANEL
Anion gap: 13 (ref 5–15)
BUN: 102 mg/dL — ABNORMAL HIGH (ref 8–23)
CO2: 25 mmol/L (ref 22–32)
Calcium: 8.3 mg/dL — ABNORMAL LOW (ref 8.9–10.3)
Chloride: 93 mmol/L — ABNORMAL LOW (ref 98–111)
Creatinine, Ser: 3.07 mg/dL — ABNORMAL HIGH (ref 0.61–1.24)
GFR, Estimated: 19 mL/min — ABNORMAL LOW (ref >60)
Glucose, Bld: 303 mg/dL — ABNORMAL HIGH (ref 70–99)
Potassium: 3.6 mmol/L (ref 3.5–5.1)
Sodium: 131 mmol/L — ABNORMAL LOW (ref 135–145)

## 2020-10-09 LAB — CBC
HCT: 29.1 % — ABNORMAL LOW (ref 39.0–52.0)
Hemoglobin: 9.9 g/dL — ABNORMAL LOW (ref 13.0–17.0)
MCH: 30.1 pg (ref 26.0–34.0)
MCHC: 34 g/dL (ref 30.0–36.0)
MCV: 88.4 fL (ref 80.0–100.0)
Platelets: 168 10*3/uL (ref 150–400)
RBC: 3.29 MIL/uL — ABNORMAL LOW (ref 4.22–5.81)
RDW: 15.2 % (ref 11.5–15.5)
WBC: 7.5 10*3/uL (ref 4.0–10.5)
nRBC: 0 % (ref 0.0–0.2)

## 2020-10-09 MED ORDER — AMIODARONE HCL 200 MG PO TABS
200.0000 mg | ORAL_TABLET | Freq: Two times a day (BID) | ORAL | Status: DC
  Administered 2020-10-09 – 2020-10-14 (×11): 200 mg via ORAL
  Filled 2020-10-09 (×11): qty 1

## 2020-10-09 MED ORDER — FUROSEMIDE 10 MG/ML IJ SOLN
80.0000 mg | Freq: Once | INTRAMUSCULAR | Status: AC
  Administered 2020-10-09: 80 mg via INTRAVENOUS
  Filled 2020-10-09: qty 8

## 2020-10-09 MED ORDER — FUROSEMIDE 10 MG/ML IJ SOLN
80.0000 mg | Freq: Two times a day (BID) | INTRAMUSCULAR | Status: DC
  Administered 2020-10-09 – 2020-10-11 (×5): 80 mg via INTRAVENOUS
  Filled 2020-10-09 (×6): qty 8

## 2020-10-09 MED ORDER — POTASSIUM CHLORIDE CRYS ER 20 MEQ PO TBCR
20.0000 meq | EXTENDED_RELEASE_TABLET | Freq: Once | ORAL | Status: AC
  Administered 2020-10-09: 20 meq via ORAL
  Filled 2020-10-09: qty 1

## 2020-10-09 NOTE — Consult Note (Addendum)
Nephrology Consult   Requesting provider: [name], MD Service requesting consult: Heart failure Reason for consult: AKI in the setting of CKD stage IV   Assessment/Recommendations: Todd Romero is a/an 87 y.o. male with a past medical history CKD stage IV, severe AS s/p remote TEVAR in 2426, chronic diastolic with acute on chronic systolic heart failure , ASCAD, HTN, T2DM, HLD, PAF with DCCV in the past on amnio who present w/ acute decompensated heart failure.   AKI on CKD stage IV Patient's baseline creatinine appears to be between 1.9 and 2.0.  On arrival to the hospital on 1/26 patient's creatinine was mildly elevated at 2.24.  Diuresis was initiated and patient is currently on 80 mg IV Lasix twice daily and his creatinine has continued to increase.  Creatinine 2/3 of 3.07 from 2.94.  Patient with 1.050 L urine output in the last 24 hours.  BUN elevated at 94 on 2/2.  GFR of 19, decreased sodium of 131, potassium 3.6.  Patient reports that he quickly feels the urge to urinate after receiving his dose of 80 mg IV Lasix and he feels like since initiating the Lasix his lower extremity edema has greatly improved.  Physical exam shows pitting edema to his hips with crackles in his lung bases.  Increase Lasix to 80 mg IV twice daily  Continue daily creatinines and dose medications for GFR  Strict I's and O's, daily weights  Maintain MAP greater than 65 for optimal renal perfusion  Avoid nephrotoxic agents including NSAIDs and vein/lotion combination  Currently no indication for hemodialysis and we do not feel that hemodialysis would be a good option for this patient given other comorbidities.  Bacteremia While hospitalized patient had an elevated WBC which went from 11>>19k.  Urine and blood cultures collected on 1/31.[name]  Urine cultures grew Enterobacter which was resistant to cefazolin and nitrofurantoin.  .  Blood cultures also grew Enterobacter resistant to cefazolin.  Blood  cultures also grew Klebsiella.  Patient was initially started on ceftriaxone and was transitioned to cefepime.  Patient has remained afebrile throughout hospitalization and his WBCs are currently trending down.  Continue antibiotics  Daily CBCs  Management per primary team  Acute on chronic systolic CHF 04/09/4195 showing normal LV size with mild left ventricular hypertrophy.  LVEF of 25-30% with diffuse hypokinesis.  Mild left atrial enlargement with no LA appendage thrombus.  No ASD or PFO.  DCCV completed back to normal sinus rhythm but patient then went back into atrial fibrillation.  EKG on 2/2 showed normal sinus rhythm while amnio gtt.  Patient was on milrinone with coox of 63% but milrinone was discontinued 2/2.  CVP 15-16.  Management per primary team  Continue IV Lasix 80 mg twice daily  Hold Entresto and spironolactone at this time  Atrial fibrillation Patient with history of PAF but came into the hospital in atrial fibrillation.  TEE with DCCV completed on 2/1 which converted him back to normal sinus rhythm but the patient then went back into atrial fibrillation.  On amnio gtt. he converted back to normal sinus rhythm on 2/2  Continue management per primary team  CAD Most recent cath in 2018 showed occluded D2 and 90% stenosis of PDA.  This was prior to TEVAR.  Continue management per primary team  OSA-unable to tolerate CPAP History of TEVAR-stable per TEE on 2/1 T2DM- per primary team   Recommendations conveyed to primary service.    [facility] 10/09/2020 11:26 AM  Patient seen and examined, agree with above note with above modifications.   Briefly , pt is a 86 year old WM with past medical history significant for DM, cardiomyopathy- really biventricular which has worsened some over the years.  He also has CKD that has been present for at least a year, possibly longer.  Functionalilty wise, it seems that patient can walk short  distances but is not very active even when he feels good.  Of late, crt has been around 2.  He is now hospitalized with urosepsis and atrial fibrillation along with the discovery of volume overload.  Through the course of the hosp he has been diuresed, received abx for this bacteremia and had his Afib managed.  crt stayed at its baseline until 3 days ago-  When increased to 2.95-  Stayed pretty stable but technically worsening over the last 2 days.  No overt low BPs noted but  BP is soft.  Even though has diuresed is still volume overloaded.  He feels poorly probably due to the fact that his BUN is now over 100.    I had a frank talk with the patient and his wife.  I suspect his A on CRF is just due to events of hospitalization and not diuresis per se.  These issues are being managed.  Our hope would be that his renal function will plateau and improve with continued treatments of acute conditions.  I did bring up the point that dialysis is something that we could use if renal function continues to worsen but I attempted to paint a realistic picture of dialysis for an 87 year old with poor functional status and cardiomyopathy.  He says that he has known people that have gone on dialysis and "they didn't last very long"    As far as recommendations, I would continue to diurese as this is what he needs.  increased lasix to 80 bid and renal will continue to follow   Todd Parish, MD 10/09/2020      _____________________________________________________________________________________ CC: Lower extremity edema  History of Present Illness: Todd Romero is a/an 87 y.o. male with a past medical history of CAD, CHF, T2DM, OSA, paroxysmal A. fib anticoagulated with Eliquis, severe aortic stenosis s/p TEVAR who presents with lower extremity edema.  Patient was admitted for heart failure exacerbation to the heart failure team.  He was given 1 dose of Lasix on 1/26 and then 1 dose of 80 mg IV Lasix on  1/27.  He was in atrial fibrillation and so a TEE was scheduled.  Patient was diuresed with Lasix.  His white count continued to increase so blood and urine cultures were collected which showed Enterobacter and Klebsiella bacteremia.  Patient was started on ceftriaxone which was then transitioned to cefepime once susceptibilities returned.  The TEE performed with cardioversion which initially cardioverted to sinus rhythm but he returned to atrial fibrillation he was then started on an amiodarone drip.  On arrival the patient's creatinine was at baseline but it began to increase throughout the hospitalization.  Significant increase in creatinine on 1/31 from 2.33-2.95 on 2/1.  Lasix was initiated 80 mg IV daily on 2/2.  Creatinine of 2.94 on 2/2 and then 3.07 on 2/3.  Nephrology was consulted at this point.  Medications:  Current Facility-Administered Medications  Medication Dose Route Frequency Provider Last Rate Last Admin  . 0.9 %  sodium chloride infusion  250 mL Intravenous PRN Isaiah Serge, NP      .  acetaminophen (TYLENOL) tablet 650 mg  650 mg Oral Q4H PRN Isaiah Serge, NP   650 mg at 10/08/20 2130  . ALPRAZolam Duanne Moron) tablet 0.25 mg  0.25 mg Oral BID PRN Larey Dresser, MD   0.25 mg at 10/08/20 2323  . amiodarone (PACERONE) tablet 200 mg  200 mg Oral BID Larey Dresser, MD   200 mg at 10/09/20 1026  . apixaban (ELIQUIS) tablet 2.5 mg  2.5 mg Oral BID Cecilie Kicks R, NP   2.5 mg at 10/09/20 1043  . ceFEPIme (MAXIPIME) 2 g in sodium chloride 0.9 % 100 mL IVPB  2 g Intravenous Q24H Barrett, Evelene Croon, PA-C   Stopped at 10/08/20 2048  . Chlorhexidine Gluconate Cloth 2 % PADS 6 each  6 each Topical Daily Larey Dresser, MD   6 each at 10/08/20 1643  . docusate sodium (COLACE) capsule 200 mg  200 mg Oral Daily PRN Clegg, Amy D, NP   200 mg at 10/04/20 1704  . ferrous sulfate tablet 325 mg  325 mg Oral BID WC Isaiah Serge, NP   325 mg at 10/09/20 1026  . Gerhardt's butt cream   Topical  Daily Consuelo Pandy, [place]   Given at 10/08/20 1624  . insulin aspart (novoLOG) injection 0-15 Units  0-15 Units Subcutaneous TID WC Isaiah Serge, NP   8 Units at 10/09/20 0840  . insulin aspart (novoLOG) injection 0-5 Units  0-5 Units Subcutaneous QHS Isaiah Serge, NP   4 Units at 10/08/20 2133  . insulin aspart (novoLOG) injection 5 Units  5 Units Subcutaneous TID WC Lyda Jester M, PA-C   5 Units at 10/09/20 0840  . insulin NPH Human (NOVOLIN N) injection 18 Units  18 Units Subcutaneous QHS Larey Dresser, MD   18 Units at 10/08/20 2132  . nystatin (MYCOSTATIN/NYSTOP) topical powder   Topical BID Consuelo Pandy, PA-C   1 application at 92/11/94 2131  . ondansetron (ZOFRAN) injection 4 mg  4 mg Intravenous Q6H PRN Isaiah Serge, NP      . pantoprazole (PROTONIX) EC tablet 40 mg  40 mg Oral BID Isaiah Serge, NP   40 mg at 10/09/20 1043  . pravastatin (PRAVACHOL) tablet 80 mg  80 mg Oral QPM Isaiah Serge, NP   80 mg at 10/08/20 1808  . sodium chloride flush (NS) 0.9 % injection 10-40 mL  10-40 mL Intracatheter Q12H Larey Dresser, MD   10 mL at 10/08/20 2131  . sodium chloride flush (NS) 0.9 % injection 10-40 mL  10-40 mL Intracatheter PRN Larey Dresser, MD      . sodium chloride flush (NS) 0.9 % injection 3 mL  3 mL Intravenous Q12H Isaiah Serge, NP   3 mL at 10/08/20 2131  . sodium chloride flush (NS) 0.9 % injection 3 mL  3 mL Intravenous PRN Isaiah Serge, NP   3 mL at 10/07/20 1728  . tamsulosin (FLOMAX) capsule 0.4 mg  0.4 mg Oral QPC supper Isaiah Serge, NP   0.4 mg at 10/08/20 1808  . traZODone (DESYREL) tablet 50 mg  50 mg Oral QHS Larey Dresser, MD   50 mg at 10/08/20 2130     ALLERGIES Ambien [zolpidem tartrate], Bactrim [sulfamethoxazole-trimethoprim], and Morphine and related  MEDICAL HISTORY Past Medical History:  Diagnosis Date  . AKI (acute kidney injury) ([place]) 10/24/2019  . Anemia   . Arthritis   . CAD (coronary  artery  disease)    a. cardiac cath 12/2016 showing moderate AS, elevated LVEDP, heavy 3V coronary calcification, 100% mD2, 30-50% LAD, 50-90% stenosis of Cx proximal to origin of L-PDA, RCA not engaged due to poor catheter control (difficult procedure) - coronary status was essentially unchanged from prior.  . Chicken pox   . Chronic diastolic CHF (congestive heart failure) ([place])   . Colon polyps   . Degenerative arthritis   . Diabetes ([place])   . Dyspnea   . Dyspnea on exertion 02/11/2015  . Essential hypertension   . Heart disease   . Heme positive stool 11/16/2017  . Hyperlipidemia   . New onset a-fib ([place]) 03/04/2018  . Obesity (BMI 30-39.9) 06/18/2015  . OSA (obstructive sleep apnea)    Severe with AHI 27/hr now on CPAP  not compliant with treatment.  . Peritonitis ([place]) 1985?  [name] Pneumonia    "6 months - 87 years old"  . Pure hypercholesterolemia   . RBBB   . [facility] spotted fever   . 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 Severe aortic stenosis    a. s/p TAVR 03/2017.  . Type II or unspecified type diabetes mellitus without mention of complication, not stated as uncontrolled   . Varicose vein of leg      SOCIAL HISTORY Social History   Socioeconomic History  . Marital status: Married    Spouse name: Not on file  . Number of children: 4  . Years of education: Not on file  . Highest education level: Not on file  Occupational History  . Occupation: Construction-Retired  Tobacco Use  . Smoking status: Never Smoker  . Smokeless tobacco: Never Used  Vaping Use  . Vaping Use: Never used  Substance and Sexual Activity  . Alcohol use: No  . Drug use: No  . Sexual activity: Not Currently    Partners: Female  Other Topics Concern  . 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 Heart failure Father   . Emphysema Father   . COPD Father   . Early death Father   . Heart attack Father   . Hypertension Mother   . Early death Mother   . Hyperlipidemia Mother   . Stroke Mother   . Cancer Sister   . Hypertension Sister   . Healthy Sister       Review of Systems: 12 systems reviewed Otherwise as per HPI, all other systems reviewed and negative  Physical Exam: Vitals:   10/09/20 0956 10/09/20 1021  BP:    Pulse:    Resp:    Temp:  98.1 F (36.7 C)  SpO2: 97%    No intake/output data recorded.  Intake/Output Summary (Last 24 hours) at 10/09/2020  1126 Last data filed at 10/08/2020 2345 Gross per 24 hour  Intake 652.89 ml  Output 1050 ml  Net -397.11 ml   General: Lying comfortably in chair next to hospital bed HEENT: anicteric sclera, oropharynx clear without lesions CV: regular rate, normal rhythm, lower extremities wrapped bilaterally, significant edema to the hips bilaterally Lungs: Crackles at lung bases bilaterally worse on the right than the left normal work of breathing Abd: soft, non-tender, non-distended Skin: no visible lesions or rashes Psych: alert, engaged, appropriate mood and affect Musculoskeletal: no obvious deformities Neuro: normal speech, no gross focal deficits   Test Results Reviewed Lab Results  Component Value Date   NA 131 (L) 10/09/2020   K 3.6 10/09/2020   CL 93 (L) 10/09/2020   CO2 25 10/09/2020   BUN 102 (H) 10/09/2020   CREATININE 3.07 (H)  10/09/2020   GFR 28.06 (L) 09/15/2020   CALCIUM 8.3 (L) 10/09/2020   ALBUMIN 3.3 (L) 10/02/2020   PHOS 2.7 11/19/2017     I have reviewed all relevant outside healthcare records related to the patient's current hospitalization

## 2020-10-09 NOTE — Progress Notes (Signed)
[facility]       Reason for Consult: bacteremia    Referring Physician: Dr. Aundra Dubin  Active Problems:   Acute on chronic diastolic HF (heart failure) (HCC)   Pressure injury of skin   . amiodarone  200 mg Oral BID  . apixaban  2.5 mg Oral BID  . Chlorhexidine Gluconate Cloth  6 each Topical Daily  . ferrous sulfate  325 mg Oral BID WC  . furosemide  80 mg Intravenous Once  . Gerhardt's butt cream   Topical Daily  . insulin aspart  0-15 Units Subcutaneous TID WC  . insulin aspart  0-5 Units Subcutaneous QHS  . insulin aspart  5 Units Subcutaneous TID WC  . insulin NPH Human  18 Units Subcutaneous QHS  . nystatin   Topical BID  . pantoprazole  40 mg Oral BID  . potassium chloride  20 mEq Oral Once  . pravastatin  80 mg Oral QPM  . sodium chloride flush  10-40 mL Intracatheter Q12H  . sodium chloride flush  3 mL Intravenous Q12H  . tamsulosin  0.4 mg Oral QPC supper  . traZODone  50 mg Oral QHS    Recommendations: Continue with cefepime   Assessment: He has a positive urine and blood culture with Enterobacter aerogenes c/w a urinary source of infection.  He is on cefepime and will continue this.  WBC peaked at 19.2 but now down to normal and he has been afebrile.  Will continue treatment for 7 days from start of cefepime on 2/1 through 2/7.  He can transition to oral levaquin 750 mg every 48 hours if he is discharged prior to 2/7.    Antibiotics: Ceftriaxone 1 day Cefepime day 3  HPI: Todd Romero is a 87 y.o. male with a history of AS and tAVR in 2018, pacemaker placement due to SSS and CKD stage 4 who presented 1/26 with DOE, orthopnea c/w acute on chronic CHF exacerbation.  His creat has increased from a baseline of under 2 to now up to 3.  He had a jump in his WBC on 1/30 to 19.2 and work up with a positive urine culture and 1 bottle of the blood culture with Enterobacter.  He is on cefepime and his WBC has normalized.  He has  remained afebrile.  His wife is at the bedside.     Review of Systems:  Constitutional: negative for fevers and chills Gastrointestinal: negative for diarrhea Integument/breast: negative for rash All other systems reviewed and are negative    Past Medical History:  Diagnosis Date  . AKI (acute kidney injury) ([place]) 10/24/2019  . Anemia   . Arthritis   . CAD (coronary artery disease)    a. cardiac cath 12/2016 showing moderate AS, elevated LVEDP, heavy 3V coronary calcification, 100% mD2, 30-50% LAD, 50-90% stenosis of Cx proximal to origin of L-PDA, RCA not engaged due to poor catheter control (difficult procedure) - coronary status was essentially unchanged from prior.  . Chicken pox   . Chronic diastolic CHF (congestive heart failure) ([place])   . Colon polyps   . Degenerative arthritis   . Diabetes ([place])   . Dyspnea   . Dyspnea on exertion 02/11/2015  . Essential hypertension   . Heart disease   . Heme positive stool 11/16/2017  . Hyperlipidemia   . New onset a-fib ([place]) 03/04/2018  . Obesity (BMI 30-39.9) 06/18/2015  . OSA (obstructive sleep apnea)    Severe with AHI  27/hr now on CPAP  not compliant with treatment.  . Peritonitis ([place]) 1985?  [name] Pneumonia    "6 months - 87 years old"  . Pure hypercholesterolemia   . RBBB   . [facility] spotted fever   . 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 Severe aortic stenosis    a. s/p TAVR 03/2017.  . Type II or unspecified type diabetes mellitus without mention of complication, not stated as uncontrolled   . Varicose vein of leg     Social History   Tobacco Use  . Smoking status: Never Smoker  . Smokeless tobacco: Never Used  Vaping Use  . Vaping Use: Never used  Substance Use Topics  . Alcohol use: No  . Drug use: No    Family History  Problem Relation Age of Onset  . Heart failure Father   .  Emphysema Father   . COPD Father   . Early death Father   . Heart attack Father   . Hypertension Mother   . Early death Mother   . Hyperlipidemia Mother   . Stroke Mother   . Cancer Sister   . Hypertension Sister   . Healthy Sister     Allergies  Allergen Reactions  . Ambien [Zolpidem Tartrate] Other (See Comments)    Severe agitation   . Bactrim [Sulfamethoxazole-Trimethoprim] Nausea And Vomiting and Other (See Comments)    Bleeding and ulcers  . Morphine And Related Nausea And Vomiting    Physical Exam: Constitutional: in no apparent distress  Vitals:   10/09/20 0419 10/09/20 0931  BP: 113/68 125/73  Pulse: 70 70  Resp: 19 18  Temp: 97.7 F (36.5 C)   SpO2: 98% 100%   EYES: anicteric ENMT: + nasal cannula Cardiovascular: Cor RRR GI: obese Musculoskeletal: + wraps on both legs Skin: negatives: no rash Neuro: non-focal  Lab Results  Component Value Date   WBC 7.5 10/09/2020   HGB 9.9 (L) 10/09/2020   HCT 29.1 (L) 10/09/2020   MCV 88.4 10/09/2020   PLT 168 10/09/2020    Lab Results  Component Value Date   CREATININE 3.07 (H) 10/09/2020   BUN 102 (H) 10/09/2020   NA 131 (L) 10/09/2020   K 3.6 10/09/2020   CL 93 (L) 10/09/2020   CO2 25 10/09/2020    Lab Results  Component Value Date   ALT 16 10/02/2020   AST 26 10/02/2020   ALKPHOS 62 10/02/2020     Microbiology: Recent Results (from the past 240 hour(s))  SARS Coronavirus 2 by RT PCR (hospital order, performed in [facility] hospital lab) Nasopharyngeal Nasopharyngeal Swab     Status: None   Collection Time: 10/01/20  5:51 PM   Specimen: Nasopharyngeal Swab  Result Value Ref Range Status   SARS Coronavirus 2 NEGATIVE NEGATIVE Final    Comment: (NOTE) SARS-CoV-2 target nucleic acids are NOT DETECTED.  The SARS-CoV-2 RNA is generally detectable in upper and lower respiratory specimens during the acute phase of infection. The lowest concentration of SARS-CoV-2 viral copies this assay can detect  is 250 copies / mL. A negative result does not preclude SARS-CoV-2 infection and should not be used as the sole basis for treatment or other patient management decisions.  A negative result may occur with improper specimen collection / handling, submission of specimen other than nasopharyngeal swab, presence of viral mutation(s) within the areas targeted  by this assay, and inadequate number of viral copies (<250 copies / mL). A negative result must be combined with clinical observations, patient history, and epidemiological information.  Fact Sheet for Patients:   StrictlyIdeas.no  Fact Sheet for Healthcare Providers: BankingDealers.co.za  This test is not yet approved or  cleared by the [place] FDA and has been authorized for detection and/or diagnosis of SARS-CoV-2 by FDA under an Emergency Use Authorization (EUA).  This EUA will remain in effect (meaning this test can be used) for the duration of the COVID-19 declaration under Section 564(b)(1) of the Act, 21 U.S.C. section 360bbb-3(b)(1), unless the authorization is terminated or revoked sooner.  Performed at [facility], [place] [address], [place], [place] [address]   Culture, Urine     Status: Abnormal   Collection Time: 10/06/20  7:40 AM   Specimen: Urine, Random  Result Value Ref Range Status   Specimen Description URINE, RANDOM  Final   Special Requests   Final    NONE Performed at [facility], [place] [address] [place], [place] [address]    Culture >=100,000 COLONIES/mL ENTEROBACTER AEROGENES (A)  Final   Report Status 10/08/2020 FINAL  Final   Organism ID, Bacteria ENTEROBACTER AEROGENES (A)  Final      Susceptibility   Enterobacter aerogenes - MIC*    CEFAZOLIN >=64 RESISTANT Resistant     CEFEPIME <=0.12 SENSITIVE Sensitive     CEFTRIAXONE <=0.25 SENSITIVE Sensitive     CIPROFLOXACIN <=0.25 SENSITIVE Sensitive     GENTAMICIN <=1 SENSITIVE Sensitive      IMIPENEM 2 SENSITIVE Sensitive     NITROFURANTOIN 64 INTERMEDIATE Intermediate     TRIMETH/SULFA <=20 SENSITIVE Sensitive     PIP/TAZO <=4 SENSITIVE Sensitive     * >=100,000 COLONIES/mL ENTEROBACTER AEROGENES  Culture, blood (routine x 2)     Status: None (Preliminary result)   Collection Time: 10/06/20  8:05 AM   Specimen: BLOOD LEFT HAND  Result Value Ref Range Status   Specimen Description BLOOD LEFT HAND  Final   Special Requests   Final    BOTTLES DRAWN AEROBIC ONLY Blood Culture adequate volume   Culture   Final    NO GROWTH 3 DAYS Performed at [facility], [address], [place], [place] [address]    Report Status PENDING  Incomplete  Culture, blood (routine x 2)     Status: Abnormal   Collection Time: 10/06/20  8:07 AM   Specimen: BLOOD RIGHT HAND  Result Value Ref Range Status   Specimen Description BLOOD RIGHT HAND  Final   Special Requests   Final    BOTTLES DRAWN AEROBIC AND ANAEROBIC Blood Culture adequate volume   Culture  Setup Time   Final    GRAM NEGATIVE RODS AEROBIC BOTTLE ONLY CRITICAL RESULT CALLED TO, READ BACK BY AND VERIFIED WITH: pharmd A. MYERS [phone] [id number] FCP Performed at [place] [address], [place], [place] [address]    Culture ENTEROBACTER AEROGENES (A)  Final   Report Status 10/09/2020 FINAL  Final   Organism ID, Bacteria ENTEROBACTER AEROGENES  Final      Susceptibility   Enterobacter aerogenes - MIC*    CEFAZOLIN >=64 RESISTANT Resistant     CEFEPIME <=0.12 SENSITIVE Sensitive     CEFTAZIDIME <=1 SENSITIVE Sensitive     CEFTRIAXONE <=0.25 SENSITIVE Sensitive     CIPROFLOXACIN <=0.25 SENSITIVE Sensitive     GENTAMICIN <=1 SENSITIVE Sensitive  IMIPENEM 1 SENSITIVE Sensitive     TRIMETH/SULFA <=20 SENSITIVE Sensitive     PIP/TAZO <=4 SENSITIVE Sensitive     * ENTEROBACTER AEROGENES  Blood Culture ID Panel (Reflexed)     Status: Abnormal   Collection Time: 10/06/20  8:07 AM  Result Value Ref Range Status    Enterococcus faecalis NOT DETECTED NOT DETECTED Final   Enterococcus Faecium NOT DETECTED NOT DETECTED Final   Listeria monocytogenes NOT DETECTED NOT DETECTED Final   Staphylococcus species NOT DETECTED NOT DETECTED Final   Staphylococcus aureus (BCID) NOT DETECTED NOT DETECTED Final   Staphylococcus epidermidis NOT DETECTED NOT DETECTED Final   Staphylococcus lugdunensis NOT DETECTED NOT DETECTED Final   Streptococcus species NOT DETECTED NOT DETECTED Final   Streptococcus agalactiae NOT DETECTED NOT DETECTED Final   Streptococcus pneumoniae NOT DETECTED NOT DETECTED Final   Streptococcus pyogenes NOT DETECTED NOT DETECTED Final   A.calcoaceticus-baumannii NOT DETECTED NOT DETECTED Final   Bacteroides fragilis NOT DETECTED NOT DETECTED Final   Enterobacterales DETECTED (A) NOT DETECTED Final    Comment: Enterobacterales represent a large order of gram negative bacteria, not a single organism. CRITICAL RESULT CALLED TO, READ BACK BY AND VERIFIED WITH: pharmd A. MYERS [id number] FCP    Enterobacter cloacae complex NOT DETECTED NOT DETECTED Final   Escherichia coli NOT DETECTED NOT DETECTED Final   Klebsiella aerogenes DETECTED (A) NOT DETECTED Final    Comment: CRITICAL RESULT CALLED TO, READ BACK BY AND VERIFIED WITH: pharmd A. MYERS [phone] [id number] FCP    Klebsiella oxytoca NOT DETECTED NOT DETECTED Final   Klebsiella pneumoniae NOT DETECTED NOT DETECTED Final   Proteus species NOT DETECTED NOT DETECTED Final   Salmonella species NOT DETECTED NOT DETECTED Final   Serratia marcescens NOT DETECTED NOT DETECTED Final   Haemophilus influenzae NOT DETECTED NOT DETECTED Final   Neisseria meningitidis NOT DETECTED NOT DETECTED Final   Pseudomonas aeruginosa NOT DETECTED NOT DETECTED Final   Stenotrophomonas maltophilia NOT DETECTED NOT DETECTED Final   Candida albicans NOT DETECTED NOT DETECTED Final   Candida auris NOT DETECTED NOT DETECTED Final   Candida glabrata NOT DETECTED NOT  DETECTED Final   Candida krusei NOT DETECTED NOT DETECTED Final   Candida parapsilosis NOT DETECTED NOT DETECTED Final   Candida tropicalis NOT DETECTED NOT DETECTED Final   Cryptococcus neoformans/gattii NOT DETECTED NOT DETECTED Final   CTX-M ESBL NOT DETECTED NOT DETECTED Final   Carbapenem resistance IMP NOT DETECTED NOT DETECTED Final   Carbapenem resistance KPC NOT DETECTED NOT DETECTED Final   Carbapenem resistance NDM NOT DETECTED NOT DETECTED Final   Carbapenem resist OXA 48 LIKE NOT DETECTED NOT DETECTED Final   Carbapenem resistance VIM NOT DETECTED NOT DETECTED Final    Comment: Performed at [facility], [place] [address] [place], [place] [address]    Katlyn Muldrew W Link Burgeson, [facility] Infectious Disease [facility] [email or web address] 10/09/2020, 9:53 AM

## 2020-10-09 NOTE — Progress Notes (Signed)
Orthopedic Tech Progress Note Patient Details:  Todd Romero April 03, 1934 [id number]  Ortho Devices Type of Ortho Device: Haematologist Ortho Device/Splint Location: Bilateral Ortho Device/Splint Interventions: Ordered,Application   Post Interventions Patient Tolerated: Well Instructions Provided: Care of device   [name] [name] 10/09/2020, 4:36 PM

## 2020-10-09 NOTE — Progress Notes (Addendum)
Patient ID: Todd Romero, male   DOB: March 05, 1934, 87 y.o.   MRN: [id number]     Advanced Heart Failure Rounding Note  PCP-Cardiologist: Sinclair Grooms, MD   Subjective:    Milrinone stopped yesterday. Co-ox 63%.   Got dose of IV Lasix yesterday x 1 w/ -1L in UOP. SCr continues to rise, 3.07 today. BUN 102. No hypotension. K ok at 3.6  Na 131  Wt up 2 lb. CVP 15-16.   SR w/ PACs on tele. Remains on amio gtt. A-pacing rate has been increased to suppress PACs.   Blood cultures with Enterobacter and Klebsiella, urine culture with Enterobacter.  CXR 1/31 without PNA.  Afebrile, WBCs lower at 7.5 today.  He is on cefepime.  Sleeping this am. Wife present at bedside. Reports he was agitated again last PM. Didn't sleep much. Improved some w/ xanax.     Device interrogation: 34% RV pacing.  Atrial fibrillation since 09/07/19.  TEE-guided DCCV on 2/1 back to NSR but reverted to atrial fibrillation. Back to NSR 2/2.   TEE: EF 25-30%, diffuse hypokinesis, mildly decreased RV systolic function, bioprosthetic aortic valve s/p TAVR mean gradient 9 mmHg without significant AI.    Objective:   Weight Range: 103.9 kg Body mass index is 35.88 kg/m.   Vital Signs:   Temp:  [97.7 F (36.5 C)-98.5 F (36.9 C)] 97.7 F (36.5 C) (02/03 0419) Pulse Rate:  [67-70] 70 (02/03 0419) Resp:  [15-21] 19 (02/03 0419) BP: (109-130)/(61-71) 113/68 (02/03 0419) SpO2:  [98 %-99 %] 98 % (02/03 0419) Weight:  [103.1 kg-103.9 kg] 103.9 kg (02/03 0419) Last BM Date: 10/05/20  Weight change: Filed Weights   10/08/20 0500 10/08/20 1306 10/09/20 0419  Weight: 103.1 kg 103.1 kg 103.9 kg    Intake/Output:   Intake/Output Summary (Last 24 hours) at 10/09/2020 0705 Last data filed at 10/08/2020 2345 Gross per 24 hour  Intake 652.89 ml  Output 1050 ml  Net -397.11 ml      Physical Exam    CVP 15-16 General:  Elderly WM. No respiratory difficulty HEENT: normal Neck: supple. no JVD. Carotids 2+  bilat; no bruits. No lymphadenopathy or thyromegaly appreciated. Cor: PMI nondisplaced. Regular rate & rhythm. No rubs, gallops or murmurs. Lungs: clear Abdomen: soft, nontender, nondistended. No hepatosplenomegaly. No bruits or masses. Good bowel sounds. Extremities: no cyanosis, clubbing, rash, trace bilateral LE edema + bilateral unna boots Neuro: alert & oriented x 3, cranial nerves grossly intact. moves all 4 extremities w/o difficulty. Affect pleasant.  Telemetry   NSR w/ 1st degree AVB and PACs, 70s Personally reviewed  Labs    CBC Recent Labs    10/08/20 0530 10/09/20 0415  WBC 9.2 7.5  HGB 8.9* 9.9*  HCT 26.3* 29.1*  MCV 88.3 88.4  PLT 156 287   Basic Metabolic Panel Recent Labs    10/07/20 0500 10/08/20 0530 10/09/20 0415  NA 131* 126* 131*  K 3.6 3.7 3.6  CL 92* 88* 93*  CO2 27 26 25   GLUCOSE 286* 386* 303*  BUN 87* 94* 102*  CREATININE 2.95* 2.94* 3.07*  CALCIUM 8.2* 8.0* 8.3*  MG 1.9  --   --    Liver Function Tests No results for input(s): AST, ALT, ALKPHOS, BILITOT, PROT, ALBUMIN in the last 72 hours. No results for input(s): LIPASE, AMYLASE in the last 72 hours. Cardiac Enzymes No results for input(s): CKTOTAL, CKMB, CKMBINDEX, TROPONINI in the last 72 hours.  BNP: BNP (last 3 results)  Recent Labs    10/24/19 1913 10/01/20 1710  BNP 125.0* 433.6*    ProBNP (last 3 results) Recent Labs    11/01/19 1221 11/30/19 1342 01/30/20 1351  PROBNP 2,476* 2,277* 1,776*     D-Dimer No results for input(s): DDIMER in the last 72 hours. Hemoglobin A1C No results for input(s): HGBA1C in the last 72 hours. Fasting Lipid Panel No results for input(s): CHOL, HDL, LDLCALC, TRIG, CHOLHDL, LDLDIRECT in the last 72 hours. Thyroid Function Tests No results for input(s): TSH, T4TOTAL, T3FREE, THYROIDAB in the last 72 hours.  Invalid input(s): FREET3  Other results:   Imaging    No results found.   Medications:     Scheduled  Medications: . apixaban  2.5 mg Oral BID  . Chlorhexidine Gluconate Cloth  6 each Topical Daily  . ferrous sulfate  325 mg Oral BID WC  . Gerhardt's butt cream   Topical Daily  . insulin aspart  0-15 Units Subcutaneous TID WC  . insulin aspart  0-5 Units Subcutaneous QHS  . insulin aspart  5 Units Subcutaneous TID WC  . insulin NPH Human  18 Units Subcutaneous QHS  . nystatin   Topical BID  . pantoprazole  40 mg Oral BID  . pravastatin  80 mg Oral QPM  . sodium chloride flush  10-40 mL Intracatheter Q12H  . sodium chloride flush  3 mL Intravenous Q12H  . tamsulosin  0.4 mg Oral QPC supper  . traZODone  50 mg Oral QHS    Infusions: . sodium chloride    . amiodarone 30 mg/hr (10/08/20 2345)  . ceFEPime (MAXIPIME) IV Stopped (10/08/20 2048)    PRN Medications: sodium chloride, acetaminophen, ALPRAZolam, docusate sodium, ondansetron (ZOFRAN) IV, sodium chloride flush, sodium chloride flush   Assessment/Plan   1. Acute on chronic systolic CHF:  Last echo in 1/21 with EF 35-40%, moderate LVH. Etiology uncertain, last cath in 2018 pre-TAVR with occluded D2 and 90% stenosis left PDA. He had MDT PPM for symptomatic bradycardia in 2/21 (after last echo).  He is RV pacing 34% of the time on device interrogation this admission. Additionally, he has been in atrial fibrillation since the beginning of January.  Echo  10/03/20 EF 30-35% RV mildly reduced TAVR ok.  TEE (2/1) with EF 25-30%, mildly decreased RV systolic function, normal function of TAVR valve. TEE-DCCV 2/1 with conversion to NSR but then back to atrial fibrillation>>converted back to NSR 2/2 on amio gtt. Milrinone discontinued yesterday. Co-ox 63% but SCr/BUN rising. Remains fluid overloaded. CVP 15-16.  - restart milrinone and IV Lasix  - needs formal RHC - continue amio gtt while on milrinone  - Continue Unna boots.  - Off hydralazine/Imdur with soft BP.   - No Entresto, spironolactone with AKI on CKD 4.   - He did not tolerate  [place] well as outpatient, ?eventually rechallenge with SGLT2 inhibitor.  - Consider cardiac amyloidosis workup.  - 34% RV pacing may be worsening CHF, will need to eventually discuss CRT upgrade with EP. Not candidate at this time with bacteremia.  - Cannot rule out progressive CAD as cause of worsening CHF, but would not do coronary angiography in absence of ACS without improvement in creatinine.  2. AKI on CKD stage IV: Creatinine continues to rise, 3.04 today. BUN 102. Fluid overloaded. - restart milrinone and IV Lasix  - RHC - monitor for uremic symptoms - if SCr/BUN continues to rise, despite inotropic support, will need nephrology consult  3. Atrial fibrillation: History  of PAF on amiodarone.  He has been in atrial fibrillation since early January. TEE-DCCV on 2/1 initially converted him to NSR but back to atrial fibrillation soon after.  Converted back to NSR on 2/2 - Continue amiodarone gtt.  - Continue Eliquis.  4. CAD: Cath in 2018 pre-TAVR with occluded D2 and 90% stenosis left PDA.  This was stable compared to prior cath and managed medically.  No chest pain or ACS this admission.  - Cannot rule out progressive CAD as cause of worsening CHF, but would not do coronary angiography in absence of ACS without improvement in creatinine.  - Continue statin.  - No ASA given Eliquis use.  5. OSA: Severe, cannot tolerate CPAP.  6. H/o TAVR: Valve stable TEE on 2/1.  7. Acute delirium/sundowning/agitation/poor sleep: Worsened with ambien and Risperdal.  -  trazodone 50 mg qhs.  - Can use very low dose Xanax during the day for agitation/anxiety. Has helped in the past.  8. Physical deconditioning: Very weak.  -Continue to mobilize w/ PT/OT - He will need SNF.  9. ID: UTI + urosepsis with Enterobacter in urine and blood, Klebsiella in blood.  Recent CXR without PNA.  Urosepsis complicates picture. WBCs trending down on abx and he is afebrile.  - Continue cefepime.   Lyda Jester,  PA-C  10/09/2020 7:05 AM  Patient seen with PA, agree with the above note with the below changes.   He remains in NSR today on amiodarone gtt.  Off milrinone with good co-ox, 63%.  BP stable.  1 dose of Lasix 80 mg IV yesterday with UOP 1050 cc.  CVP 15 this morning.  Unfortunately, BUN and creatinine continue to rise.   He remains on cefepime for Enterobacter and Klebsiella bacteremia, suspect urosepsis. WBCs now normal and afebrile.   General: NAD Neck: JVP 14-16 cm, no thyromegaly or thyroid nodule.  Lungs: Clear to auscultation bilaterally with normal respiratory effort. CV: Nondisplaced PMI.  Heart regular S1/S2, no S3/S4, 1/6 SEM RUSB.  1+ ankle edema.  Abdomen: Soft, nontender, no hepatosplenomegaly, no distention.  Skin: Intact without lesions or rashes.  Neurologic: Alert and oriented x 3.  Psych: Normal affect. Extremities: No clubbing or cyanosis.  HEENT: Normal.   Volume overloaded with CVP 15 but BUN/creatinine continue to rise.  Good co-ox and stable BP, do not think that restarting milrinone is going to be particularly beneficial.  ?ATN in setting of urosepsis.  - Lasix 80 mg IV x 1 again today.  - Will consult nephrology with ongoing worsening of renal function.   Continue cefepime for Enterobacter (blood/urine), Klebsiella (blood).  Now afebrile with normal WBCs.   He is in NSR on amiodarone gtt.  Stop gtt and start amiodarone 200 mg bid.    RV pacing 34% on initial device interrogation, however does not seem to be RV pacing much since DCCV.  Will need to reassess RV pacing percentage in NSR eventually, not candidate regardless for CRT upgrade with bacteremia.   [name] 10/09/2020 9:22 AM

## 2020-10-09 NOTE — Progress Notes (Signed)
Physical Therapy Treatment Patient Details Name: Todd Romero MRN: [id number] DOB: [birth year] Today's Date: 10/09/2020    History of Present Illness Pt with acute on chronic systolic chf. PMH - pacer, TAVR, ckd, afib, cad, TKR, DM    PT Comments    Pt making slow steady progress. Able to amb into hallway. Continue to recommend SNF.    Follow Up Recommendations  SNF     Equipment Recommendations  None recommended by PT    Recommendations for Other Services       Precautions / Restrictions Precautions Precautions: Fall    Mobility  Bed Mobility Overal bed mobility: Needs Assistance Bed Mobility: Sidelying to Sit;Rolling Rolling: Mod assist;+2 for safety/equipment Sidelying to sit: Mod assist;+2 for safety/equipment       General bed mobility comments: Assist to bring legs off of bed and elevate trunk into sitting.  Transfers Overall transfer level: Needs assistance Equipment used: Rolling walker (2 wheeled) Transfers: Sit to/from [facility] Sit to Stand: Mod assist;+2 physical assistance;From elevated surface         General transfer comment: Assist to bring hips up. Verbal cues for hand placement.  Ambulation/Gait Ambulation/Gait assistance: Min assist;+2 physical assistance Gait Distance (Feet): 20 Feet Assistive device: Rolling walker (2 wheeled) Gait Pattern/deviations: Step-to pattern;Decreased step length - right;Decreased step length - left;Shuffle;Trunk flexed Gait velocity: decr Gait velocity interpretation: <1.31 ft/sec, indicative of household ambulator General Gait Details: Assist for balance and support. 2nd person followed pt with chair to maximize amb distance.   Stairs             Wheelchair Mobility    Modified Rankin (Stroke Patients Only)       Balance Overall balance assessment: Needs assistance Sitting-balance support: Bilateral upper extremity supported;Feet supported Sitting balance-Leahy Scale:  Poor Sitting balance - Comments: UE support and verbal cues to correct posterior lean at times Postural control: Posterior lean Standing balance support: Bilateral upper extremity supported Standing balance-Leahy Scale: Poor Standing balance comment: walker and min assist for static standing                            Cognition Arousal/Alertness: Awake/alert Behavior During Therapy: WFL for tasks assessed/performed Overall Cognitive Status: Impaired/Different from baseline Area of Impairment: Memory;Following commands;Problem solving                     Memory: Decreased short-term memory Following Commands: Follows one step commands consistently;Follows one step commands with increased time     Problem Solving: Slow processing;Requires verbal cues        Exercises      General Comments General comments (skin integrity, edema, etc.): Pt amb on RA with SpO2 97%. Left O2 off and nurse aware.      Pertinent Vitals/Pain Pain Assessment: No/denies pain    Home Living                      Prior Function            PT Goals (current goals can now be found in the care plan section) Acute Rehab PT Goals Patient Stated Goal: get stronger Progress towards PT goals: Progressing toward goals    Frequency    Min 3X/week      PT Plan Current plan remains appropriate    Co-evaluation              AM-PAC PT "6 Clicks"  Mobility   Outcome Measure  Help needed turning from your back to your side while in a flat bed without using bedrails?: A Lot Help needed moving from lying on your back to sitting on the side of a flat bed without using bedrails?: A Lot Help needed moving to and from a bed to a chair (including a wheelchair)?: A Lot Help needed standing up from a chair using your arms (e.g., wheelchair or bedside chair)?: A Lot Help needed to walk in hospital room?: A Little Help needed climbing 3-5 steps with a railing? : Total 6 Click  Score: 12    End of Session Equipment Utilized During Treatment: Gait belt Activity Tolerance: Patient limited by fatigue Patient left: in chair;with call bell/phone within reach;with family/visitor present;with chair alarm set Nurse Communication: Mobility status PT Visit Diagnosis: Other abnormalities of gait and mobility (R26.89);Unsteadiness on feet (R26.81);Muscle weakness (generalized) (M62.81)     Time: 7841-2820 PT Time Calculation (min) (ACUTE ONLY): 36 min  Charges:  $Gait Training: 23-37 mins                     [place] Pager [phone] Office [place] 10/09/2020, 10:23 AM

## 2020-10-09 NOTE — Progress Notes (Addendum)
Pt assisted to sit EOB for UB strengthening with level 2 theraband and one grooming task. Continues to required min assist and cues for sitting balance as he has a posterior lean. Pt able to recall 3 theraband exercises he is to complete on his own. Pt reports he may need HD.  Will continue to follow.    10/09/20 1500  OT Visit Information  Assistance Needed +2  History of Present Illness Pt with acute on chronic systolic chf. PMH - pacer, TAVR, ckd, afib, cad, TKR, DM  Precautions  Precautions Fall  Pain Assessment  Pain Assessment No/denies pain  Cognition  Arousal/Alertness Awake/alert  Behavior During Therapy WFL for tasks assessed/performed  Overall Cognitive Status Impaired/Different from baseline  Area of Impairment Memory;Problem solving  Memory Decreased short-term memory  Problem Solving Slow processing;Requires verbal cues  General Comments pt aware he is going to need HD, recalled theraband exercises after education  ADL  Overall ADL's  Needs assistance/impaired  Grooming Oral care;Sitting;Set up  Lower Body Dressing Total assistance;Bed level  Lower Body Dressing Details (indicate cue type and reason) socks over unna boots  Bed Mobility  Overal bed mobility Needs Assistance  Bed Mobility Rolling;Sidelying to Sit;Sit to Sidelying  Rolling Mod assist  Sidelying to sit Mod assist  Sit to sidelying Mod assist  Balance  Overall balance assessment Needs assistance  Sitting-balance support No upper extremity supported;Feet supported  Sitting balance-Leahy Scale Poor  Sitting balance - Comments needing min assist for sitting balance during grooming and UB exercises  Postural control Posterior lean  Exercises  Exercises General Upper Extremity  General Exercises - Upper Extremity  Shoulder Extension Strengthening;Both;10 reps;Seated;Theraband  Shoulder Horizontal ABduction Strengthening;Both;10 reps;Seated;Theraband  Elbow Extension Strengthening;Both;10  reps;Seated;Theraband  Shoulder Flexion Strengthening;Both;10 reps;Theraband;Seated  Elbow Flexion Strengthening;Both;10 reps;Seated;Theraband  Theraband Level (Shoulder Flexion) Level 2 (Red)  Theraband Level (Shoulder Extension) Level 2 (Red)  Theraband Level (Shoulder Horizontal Abduction) Level 2 (Red)  Theraband Level (Elbow Flexion) Level 2 (Red)  Theraband Level (Elbow Extension) Level 2 (Red)  OT - End of Session  Activity Tolerance Patient tolerated treatment well  Patient left in bed;with call bell/phone within reach;with bed alarm set  OT Assessment/Plan  OT Plan Discharge plan remains appropriate  OT Visit Diagnosis Unsteadiness on feet (R26.81);Other abnormalities of gait and mobility (R26.89);Muscle weakness (generalized) (M62.81);Other symptoms and signs involving cognitive function  OT Frequency (ACUTE ONLY) Min 2X/week  Follow Up Recommendations SNF;Supervision/Assistance - 24 hour  OT Equipment 3 in 1 bedside commode;Wheelchair (measurements OT);Wheelchair cushion (measurements OT)  AM-PAC OT "6 Clicks" Daily Activity Outcome Measure (Version 2)  Help from another person eating meals? 3  Help from another person taking care of personal grooming? 3  Help from another person toileting, which includes using toliet, bedpan, or urinal? 1  Help from another person bathing (including washing, rinsing, drying)? 2  Help from another person to put on and taking off regular upper body clothing? 2  Help from another person to put on and taking off regular lower body clothing? 1  6 Click Score 12  OT Goal Progression  Progress towards OT goals Progressing toward goals  Acute Rehab OT Goals  Patient Stated Goal get stronger  OT Goal Formulation With patient  Time For Goal Achievement 10/20/20  Potential to Achieve Goals Good  OT Time Calculation  OT Start Time (ACUTE ONLY) 1520  OT Stop Time (ACUTE ONLY) 1538  OT Time Calculation (min) 18 min  OT General Charges  $OT Visit 1  Visit  OT Treatments  $Therapeutic Exercise 8-22 mins  Nestor Lewandowsky, OTR/L Acute Rehabilitation Services Pager: [phone] Office: [phone]

## 2020-10-10 DIAGNOSIS — I5033 Acute on chronic diastolic (congestive) heart failure: Secondary | ICD-10-CM | POA: Diagnosis not present

## 2020-10-10 LAB — GLUCOSE, CAPILLARY
Glucose-Capillary: 151 mg/dL — ABNORMAL HIGH (ref 70–99)
Glucose-Capillary: 230 mg/dL — ABNORMAL HIGH (ref 70–99)
Glucose-Capillary: 276 mg/dL — ABNORMAL HIGH (ref 70–99)
Glucose-Capillary: 212 mg/dL — ABNORMAL HIGH (ref 70–99)

## 2020-10-10 LAB — BASIC METABOLIC PANEL
Anion gap: 11 (ref 5–15)
BUN: 98 mg/dL — ABNORMAL HIGH (ref 8–23)
CO2: 27 mmol/L (ref 22–32)
Calcium: 8.4 mg/dL — ABNORMAL LOW (ref 8.9–10.3)
Chloride: 96 mmol/L — ABNORMAL LOW (ref 98–111)
Creatinine, Ser: 2.7 mg/dL — ABNORMAL HIGH (ref 0.61–1.24)
GFR, Estimated: 23 mL/min — ABNORMAL LOW (ref >60)
Glucose, Bld: 166 mg/dL — ABNORMAL HIGH (ref 70–99)
Potassium: 3.2 mmol/L — ABNORMAL LOW (ref 3.5–5.1)
Sodium: 134 mmol/L — ABNORMAL LOW (ref 135–145)

## 2020-10-10 LAB — CBC
HCT: 29.7 % — ABNORMAL LOW (ref 39.0–52.0)
Hemoglobin: 10.3 g/dL — ABNORMAL LOW (ref 13.0–17.0)
MCH: 30.6 pg (ref 26.0–34.0)
MCHC: 34.7 g/dL (ref 30.0–36.0)
MCV: 88.1 fL (ref 80.0–100.0)
Platelets: 212 10*3/uL (ref 150–400)
RBC: 3.37 MIL/uL — ABNORMAL LOW (ref 4.22–5.81)
RDW: 15.2 % (ref 11.5–15.5)
WBC: 8.1 10*3/uL (ref 4.0–10.5)
nRBC: 0 % (ref 0.0–0.2)

## 2020-10-10 LAB — IRON AND TIBC
Iron: 44 ug/dL — ABNORMAL LOW (ref 45–182)
Saturation Ratios: 17 % — ABNORMAL LOW (ref 17.9–39.5)
TIBC: 256 ug/dL (ref 250–450)
UIBC: 212 ug/dL

## 2020-10-10 LAB — COOXEMETRY PANEL
Carboxyhemoglobin: 0.9 % (ref 0.5–1.5)
Methemoglobin: 1 % (ref 0.0–1.5)
O2 Saturation: 77.6 %
Total hemoglobin: 10.2 g/dL — ABNORMAL LOW (ref 12.0–16.0)

## 2020-10-10 MED ORDER — POTASSIUM CHLORIDE 20 MEQ PO PACK
40.0000 meq | PACK | Freq: Once | ORAL | Status: AC
  Administered 2020-10-10: 40 meq via ORAL
  Filled 2020-10-10: qty 2

## 2020-10-10 MED ORDER — INSULIN NPH (HUMAN) (ISOPHANE) 100 UNIT/ML ~~LOC~~ SUSP
18.0000 [IU] | Freq: Two times a day (BID) | SUBCUTANEOUS | Status: DC
  Administered 2020-10-10 – 2020-10-14 (×9): 18 [IU] via SUBCUTANEOUS
  Filled 2020-10-10: qty 10

## 2020-10-10 MED ORDER — MAGNESIUM SULFATE 2 GM/50ML IV SOLN
2.0000 g | Freq: Once | INTRAVENOUS | Status: AC
  Administered 2020-10-10: 2 g via INTRAVENOUS
  Filled 2020-10-10: qty 50

## 2020-10-10 NOTE — Progress Notes (Signed)
Patient ID: Todd Romero, male   DOB: 1933/11/12, 87 y.o.   MRN: [id number]     Advanced Heart Failure Rounding Note  PCP-Cardiologist: Sinclair Grooms, MD   Subjective:    I/Os mildly negative.  He is on Lasix 80 mg IV bid.  CVP 9-10 this morning.  Co-ox stable at 78% off milrinone.  BUN/creatinine now trending down.   Blood culture/urine culture with Enterobacter aerogenes.  CXR 1/31 without PNA.  Afebrile, WBCs 8.1 today.  He is on cefepime.  He slept better last night.   Device interrogation: 34% RV pacing.  Atrial fibrillation since 09/07/19.  TEE-guided DCCV on 2/1 back to NSR but reverted to atrial fibrillation. Back to NSR 2/2.   TEE: EF 25-30%, diffuse hypokinesis, mildly decreased RV systolic function, bioprosthetic aortic valve s/p TAVR mean gradient 9 mmHg without significant AI.    Objective:   Weight Range: 103.9 kg Body mass index is 35.88 kg/m.   Vital Signs:   Temp:  [97.8 F (36.6 C)-98.1 F (36.7 C)] 98 F (36.7 C) (02/04 0312) Pulse Rate:  [68-70] 70 (02/04 0312) Resp:  [18-20] 20 (02/04 0312) BP: (115-119)/(67-69) 115/69 (02/03 2134) SpO2:  [97 %-100 %] 100 % (02/04 0312) Last BM Date: 10/07/20  Weight change: Filed Weights   10/08/20 0500 10/08/20 1306 10/09/20 0419  Weight: 103.1 kg 103.1 kg 103.9 kg    Intake/Output:   Intake/Output Summary (Last 24 hours) at 10/10/2020 0932 Last data filed at 10/10/2020 0325 Gross per 24 hour  Intake 346.8 ml  Output 1225 ml  Net -878.2 ml      Physical Exam    CVP 9-10 General: NAD Neck: JVP 10-12 cm, no thyromegaly or thyroid nodule.  Lungs: Clear to auscultation bilaterally with normal respiratory effort. CV: Nondisplaced PMI.  Heart regular S1/S2, no S3/S4, no murmur.  1+ ankle edema.  Abdomen: Soft, nontender, no hepatosplenomegaly, no distention.  Skin: Intact without lesions or rashes.  Neurologic: Alert and oriented x 3.  Psych: Normal affect. Extremities: No clubbing or cyanosis.   HEENT: Normal.    Telemetry   ?NSR 70s. Personally reviewed  Labs    CBC Recent Labs    10/09/20 0415 10/10/20 0417  WBC 7.5 8.1  HGB 9.9* 10.3*  HCT 29.1* 29.7*  MCV 88.4 88.1  PLT 168 992   Basic Metabolic Panel Recent Labs    10/09/20 0415 10/10/20 0417  NA 131* 134*  K 3.6 3.2*  CL 93* 96*  CO2 25 27  GLUCOSE 303* 166*  BUN 102* 98*  CREATININE 3.07* 2.70*  CALCIUM 8.3* 8.4*   Liver Function Tests No results for input(s): AST, ALT, ALKPHOS, BILITOT, PROT, ALBUMIN in the last 72 hours. No results for input(s): LIPASE, AMYLASE in the last 72 hours. Cardiac Enzymes No results for input(s): CKTOTAL, CKMB, CKMBINDEX, TROPONINI in the last 72 hours.  BNP: BNP (last 3 results) Recent Labs    10/24/19 1913 10/01/20 1710  BNP 125.0* 433.6*    ProBNP (last 3 results) Recent Labs    11/01/19 1221 11/30/19 1342 01/30/20 1351  PROBNP 2,476* 2,277* 1,776*     D-Dimer No results for input(s): DDIMER in the last 72 hours. Hemoglobin A1C No results for input(s): HGBA1C in the last 72 hours. Fasting Lipid Panel No results for input(s): CHOL, HDL, LDLCALC, TRIG, CHOLHDL, LDLDIRECT in the last 72 hours. Thyroid Function Tests No results for input(s): TSH, T4TOTAL, T3FREE, THYROIDAB in the last 72 hours.  Invalid input(s):  FREET3  Other results:   Imaging    No results found.   Medications:     Scheduled Medications: . amiodarone  200 mg Oral BID  . apixaban  2.5 mg Oral BID  . Chlorhexidine Gluconate Cloth  6 each Topical Daily  . ferrous sulfate  325 mg Oral BID WC  . furosemide  80 mg Intravenous BID  . Gerhardt's butt cream   Topical Daily  . insulin aspart  0-15 Units Subcutaneous TID WC  . insulin aspart  0-5 Units Subcutaneous QHS  . insulin aspart  5 Units Subcutaneous TID WC  . insulin NPH Human  18 Units Subcutaneous BID AC & HS  . nystatin   Topical BID  . pantoprazole  40 mg Oral BID  . pravastatin  80 mg Oral QPM  . sodium  chloride flush  10-40 mL Intracatheter Q12H  . sodium chloride flush  3 mL Intravenous Q12H  . tamsulosin  0.4 mg Oral QPC supper  . traZODone  50 mg Oral QHS    Infusions: . sodium chloride    . ceFEPime (MAXIPIME) IV 2 g (10/09/20 1914)    PRN Medications: sodium chloride, acetaminophen, ALPRAZolam, docusate sodium, ondansetron (ZOFRAN) IV, sodium chloride flush, sodium chloride flush   Assessment/Plan   1. Acute on chronic systolic CHF:  Last echo in 1/21 with EF 35-40%, moderate LVH. Etiology uncertain, last cath in 2018 pre-TAVR with occluded D2 and 90% stenosis left PDA. He had MDT PPM for symptomatic bradycardia in 2/21 (after last echo).  He is RV pacing 34% of the time on device interrogation this admission. Additionally, he has been in atrial fibrillation since the beginning of January.  Echo  10/03/20 EF 30-35% RV mildly reduced TAVR ok.  TEE (2/1) with EF 25-30%, mildly decreased RV systolic function, normal function of TAVR valve. TEE-DCCV 2/1 with conversion to NSR but then back to atrial fibrillation>>converted back to NSR on amio gtt. Milrinone discontinued 2/2. Co-ox 78% today.  BUN/creatinine now trending down.  CVP 9-10 with mild volume overload on exam.  - Will leave off milrinone with adequate co-ox.   - Continue Lasix 80 mg IV bid today.  - Continue Unna boots.  - Off hydralazine/Imdur with soft BP.   - No Entresto, spironolactone with AKI on CKD 4.   - He did not tolerate [place] well as outpatient.  - Cardiac amyloidosis workup => can get PYP scan today.  Myeloma panel and urine IFXN were negative.  - RV pacing 34% on initial device interrogation, however does not seem to be RV pacing much since DCCV.  Will need to reassess RV pacing percentage in NSR eventually, not candidate regardless for CRT upgrade with bacteremia.  - Cannot rule out progressive CAD as cause of worsening CHF, but would not do coronary angiography in absence of ACS without improvement in  creatinine.  2. AKI on CKD stage IV: Creatinine trending down today and CVP lower. ?ATN in setting of urosepsis + diuresis.  Hopefully recovering.  - Continue IV Lasix at least today, then likely to torsemide.  - Appreciate nephrology assistance.  3. Atrial fibrillation: History of PAF on amiodarone.  He has been in atrial fibrillation since early January. TEE-DCCV on 2/1 initially converted him to NSR but back to atrial fibrillation soon after.  Converted back to NSR on 2/2.  - Continue amiodarone 200 mg bid.  - Continue Eliquis.  - Suspect NSR today but will get ECG to confirm.  4. CAD: Cath  in 2018 pre-TAVR with occluded D2 and 90% stenosis left PDA.  This was stable compared to prior cath and managed medically.  No chest pain or ACS this admission.  - Cannot rule out progressive CAD as cause of worsening CHF, but would not do coronary angiography in absence of ACS without improvement in creatinine.  - Continue statin.  - No ASA given Eliquis use.  5. OSA: Severe, cannot tolerate CPAP.  6. H/o TAVR: Valve stable TEE on 2/1.  7. Acute delirium/sundowning/agitation/poor sleep: Worsened with ambien and Risperdal. Better last night.  - trazodone 50 mg qhs.  - Can use very low dose Xanax during the day for agitation/anxiety. Has helped in the past.  8. Physical deconditioning: Very weak.  - Continue to mobilize w/ PT/OT - He will need SNF (social work consulted).   9. ID: UTI + urosepsis with Enterobacter aerogenes urine and blood.  Recent CXR without PNA.  WBCs normal and afebrile.   - Continue cefepime to 2/7.    [name] 10/10/2020 9:32 AM

## 2020-10-10 NOTE — Progress Notes (Signed)
[facility] Progress Note    Assessment/ Plan:   1. AKI on CKD stage IV a. Baseline creatinine approximately 2.  Had mildly increased creatinine on arrival to the hospital on 1/26 of 2.24.  He presented in congestive heart failure exacerbation so diuresis was initiated.  He is also in atrial fibrillation.  On 2/2 his creatinine increased to 2.9 and then to 3.07 on 2/3.  He had approximately 1 L of urine output on 2/3 but given his increasing creatinine with Lasix.  Nephrology was consulted for evaluation. .  Over the last 24 hours he has had urinary output of 1225 mL.  Creatinine has improved to 2.70 from 3.07.  BUN mildly decreased at 98 from 102 yesterday.  Sodium of 134, potassium of 3.2 b. We will continue 80 mg IV Lasix twice daily c. Daily renal function panel d. Repleted potassium given hypokalemic this morning at 3.2 e. If renal function continues to improve consider switching over to an oral agent such as torsemide 2. Bacteremia a. During this hospitalization patient had a sudden increase in his white count from 11 K to 19 K.  Urine and blood cultures were collected which grew Enterobacter in his urine and blood.  Patient was initially started on ceftriaxone which was transitioned to cefepime once susceptibilities returned. b. Continuing antibiotics per primary team and daily CBCs, white count continues to downtrend 3. Acute on chronic systolic CHF a. Management per primary team b. Continue IV Lasix 80 mg twice daily c. Continue to hold Entresto and spironolactone 4. Atrial fibrillation a. Patient with past medical history of PAF who came into the hospital in atrial fibrillation, TEE performed showing no clot with DCCV which converted him to NS but the patient jumped back into atrial fibrillation.  Amio GTT was initiated and patient back into sinus rhythm. b. Continue management per primary team 5. CAD 6. OSA-unable to tolerate CPAP 7. History of TEVAR-stable per TEE  on 2/1 8. T2 DM-per primary team 9. Anemia a. Hemoglobin of 10.3 today, most likely anemia of chronic disease but will check iron levels   Subjective:   No acute events overnight.  Patient's wife reports that he did really well overnight and did not get extremely agitated and his anxiety was also improved.   Objective:   BP 115/69 (BP Location: Left Arm)   Pulse 70   Temp 98 F (36.7 C) (Oral)   Resp 20   Ht 5\' 7"  (1.702 m)   Wt 103.9 kg   SpO2 100%   BMI 35.88 kg/m   Intake/Output Summary (Last 24 hours) at 10/10/2020 0840 Last data filed at 10/10/2020 0325 Gross per 24 hour  Intake 346.8 ml  Output 1225 ml  Net -878.2 ml   Weight change:   Physical Exam: Gen: Chronically ill but well-appearing this morning, up with therapy on evaluation CVS regular rate and rhythm, no murmurs appreciated, pitting edema to his thighs Resp: Normal work of breathing, mild crackles in lung bases, improved from yesterday Abd: Soft, nontender, positive bowel sounds Ext: Lower extremities are wrapped, no cyanosis noted  Imaging: No results found.  Labs: BMET Recent Labs  Lab 10/04/20 0332 10/05/20 0419 10/06/20 0213 10/07/20 0500 10/08/20 0530 10/09/20 0415 10/10/20 0417  NA 135 134* 131* 131* 126* 131* 134*  K 3.5 3.2* 3.8 3.6 3.7 3.6 3.2*  CL 94* 92* 90* 92* 88* 93* 96*  CO2 28 30 26 27 26 25 27   GLUCOSE 196* 155* 323* 286* 386*  303* 166*  BUN 70* 63* 71* 87* 94* 102* 98*  CREATININE 2.25* 2.06* 2.33* 2.95* 2.94* 3.07* 2.70*  CALCIUM 9.1 8.7* 8.9 8.2* 8.0* 8.3* 8.4*   CBC Recent Labs  Lab 10/07/20 0500 10/08/20 0530 10/09/20 0415 10/10/20 0417  WBC 14.2* 9.2 7.5 8.1  HGB 9.2* 8.9* 9.9* 10.3*  HCT 27.0* 26.3* 29.1* 29.7*  MCV 88.5 88.3 88.4 88.1  PLT 147* 156 168 212    Medications:    . amiodarone  200 mg Oral BID  . apixaban  2.5 mg Oral BID  . Chlorhexidine Gluconate Cloth  6 each Topical Daily  . ferrous sulfate  325 mg Oral BID WC  . furosemide  80 mg  Intravenous BID  . Gerhardt's butt cream   Topical Daily  . insulin aspart  0-15 Units Subcutaneous TID WC  . insulin aspart  0-5 Units Subcutaneous QHS  . insulin aspart  5 Units Subcutaneous TID WC  . insulin NPH Human  18 Units Subcutaneous QHS  . nystatin   Topical BID  . pantoprazole  40 mg Oral BID  . pravastatin  80 mg Oral QPM  . sodium chloride flush  10-40 mL Intracatheter Q12H  . sodium chloride flush  3 mL Intravenous Q12H  . tamsulosin  0.4 mg Oral QPC supper  . traZODone  50 mg Oral QHS     Gifford Shave, MD [place] Resident, PGY2 10/10/2020, 8:40 AM

## 2020-10-10 NOTE — Care Management Important Message (Signed)
Important Message  Patient Details  Name: KAZUMI LACHNEY MRN: [id number] Date of Birth: [birth year]   Medicare Important Message Given:  Yes     Shelda Altes 10/10/2020, 11:07 AM

## 2020-10-10 NOTE — Progress Notes (Signed)
Physical Therapy Treatment Patient Details Name: Todd Romero MRN: [id number] DOB: 05/12/1934 Today's Date: 10/10/2020    History of Present Illness Pt with acute on chronic systolic chf. PMH - pacer, TAVR, ckd, afib, cad, TKR, DM    PT Comments    Pt continues to make slow progress. Continue to recommend ST-SNF at [place].    Follow Up Recommendations  SNF     Equipment Recommendations  None recommended by PT    Recommendations for Other Services       Precautions / Restrictions Precautions Precautions: Fall    Mobility  Bed Mobility Overal bed mobility: Needs Assistance Bed Mobility: Sidelying to Sit;Rolling Rolling: Mod assist Sidelying to sit: Mod assist       General bed mobility comments: Assist to bring legs off of bed and elevate trunk into sitting.  Transfers Overall transfer level: Needs assistance Equipment used: Rolling walker (2 wheeled) Transfers: Sit to/from Stand Sit to Stand: +2 physical assistance;From elevated surface;Min assist         General transfer comment: Assist to bring hips up. Verbal cues for hand placement.  Ambulation/Gait Ambulation/Gait assistance: Min assist;+2 safety/equipment Gait Distance (Feet): 8 Feet (5' x 1, 8' x 1) Assistive device: Rolling walker (2 wheeled) Gait Pattern/deviations: Step-to pattern;Decreased step length - right;Decreased step length - left;Shuffle;Trunk flexed Gait velocity: decr Gait velocity interpretation: <1.31 ft/sec, indicative of household ambulator General Gait Details: Assist for balance and support. 2nd person followed pt with chair to maximize amb distance. Pt had to stop to use BSC in middle of amb   Stairs             Wheelchair Mobility    Modified Rankin (Stroke Patients Only)       Balance Overall balance assessment: Needs assistance Sitting-balance support: Bilateral upper extremity supported;Feet supported Sitting balance-Leahy Scale: Poor Sitting balance -  Comments: UE support and min guard   Standing balance support: Bilateral upper extremity supported Standing balance-Leahy Scale: Poor Standing balance comment: walker and min assist for static standing                            Cognition Arousal/Alertness: Awake/alert Behavior During Therapy: WFL for tasks assessed/performed Overall Cognitive Status: Impaired/Different from baseline Area of Impairment: Memory;Problem solving                     Memory: Decreased short-term memory       Problem Solving: Slow processing;Requires verbal cues        Exercises      General Comments General comments (skin integrity, edema, etc.): Pt amb on RA with SpO2 97%. Left O2 off and nurse aware.      Pertinent Vitals/Pain      Home Living                      Prior Function            PT Goals (current goals can now be found in the care plan section) Acute Rehab PT Goals Patient Stated Goal: get stronger Progress towards PT goals: Progressing toward goals    Frequency    Min 3X/week      PT Plan Current plan remains appropriate    Co-evaluation              AM-PAC PT "6 Clicks" Mobility   Outcome Measure  Help needed turning from your back to your  side while in a flat bed without using bedrails?: A Lot Help needed moving from lying on your back to sitting on the side of a flat bed without using bedrails?: A Lot Help needed moving to and from a bed to a chair (including a wheelchair)?: A Lot Help needed standing up from a chair using your arms (e.g., wheelchair or bedside chair)?: A Little Help needed to walk in hospital room?: A Little Help needed climbing 3-5 steps with a railing? : Total 6 Click Score: 13    End of Session Equipment Utilized During Treatment: Gait belt Activity Tolerance: Patient limited by fatigue Patient left: in chair;with call bell/phone within reach;with family/visitor present;with chair alarm set Nurse  Communication: Mobility status PT Visit Diagnosis: Other abnormalities of gait and mobility (R26.89);Unsteadiness on feet (R26.81);Muscle weakness (generalized) (M62.81)     Time: 5797-2820 ((out of room due to pt on bsc x 10 minutes)) PT Time Calculation (min) (ACUTE ONLY): 41 min  Charges:  $Gait Training: 23-37 mins                     [place] Pager [phone] Office [place] 10/10/2020, 10:36 AM

## 2020-10-11 DIAGNOSIS — I5043 Acute on chronic combined systolic (congestive) and diastolic (congestive) heart failure: Secondary | ICD-10-CM | POA: Diagnosis not present

## 2020-10-11 LAB — RENAL FUNCTION PANEL
Albumin: 2.1 g/dL — ABNORMAL LOW (ref 3.5–5.0)
Anion gap: 14 (ref 5–15)
BUN: 98 mg/dL — ABNORMAL HIGH (ref 8–23)
CO2: 23 mmol/L (ref 22–32)
Calcium: 8.3 mg/dL — ABNORMAL LOW (ref 8.9–10.3)
Chloride: 96 mmol/L — ABNORMAL LOW (ref 98–111)
Creatinine, Ser: 2.65 mg/dL — ABNORMAL HIGH (ref 0.61–1.24)
GFR, Estimated: 23 mL/min — ABNORMAL LOW
Glucose, Bld: 112 mg/dL — ABNORMAL HIGH (ref 70–99)
Phosphorus: 3.6 mg/dL (ref 2.5–4.6)
Potassium: 3.3 mmol/L — ABNORMAL LOW (ref 3.5–5.1)
Sodium: 133 mmol/L — ABNORMAL LOW (ref 135–145)

## 2020-10-11 LAB — GLUCOSE, CAPILLARY
Glucose-Capillary: 122 mg/dL — ABNORMAL HIGH (ref 70–99)
Glucose-Capillary: 203 mg/dL — ABNORMAL HIGH (ref 70–99)
Glucose-Capillary: 111 mg/dL — ABNORMAL HIGH (ref 70–99)
Glucose-Capillary: 169 mg/dL — ABNORMAL HIGH (ref 70–99)

## 2020-10-11 LAB — CBC
HCT: 31.7 % — ABNORMAL LOW (ref 39.0–52.0)
Hemoglobin: 10.5 g/dL — ABNORMAL LOW (ref 13.0–17.0)
MCH: 29.4 pg (ref 26.0–34.0)
MCHC: 33.1 g/dL (ref 30.0–36.0)
MCV: 88.8 fL (ref 80.0–100.0)
Platelets: 213 10*3/uL (ref 150–400)
RBC: 3.57 MIL/uL — ABNORMAL LOW (ref 4.22–5.81)
RDW: 15 % (ref 11.5–15.5)
WBC: 8.7 10*3/uL (ref 4.0–10.5)
nRBC: 0 % (ref 0.0–0.2)

## 2020-10-11 LAB — COOXEMETRY PANEL
Carboxyhemoglobin: 1 % (ref 0.5–1.5)
Methemoglobin: 0.9 % (ref 0.0–1.5)
O2 Saturation: 62.2 %
Total hemoglobin: 9.6 g/dL — ABNORMAL LOW (ref 12.0–16.0)

## 2020-10-11 LAB — CULTURE, BLOOD (ROUTINE X 2)
Culture: NO GROWTH
Special Requests: ADEQUATE

## 2020-10-11 MED ORDER — TEMAZEPAM 7.5 MG PO CAPS
7.5000 mg | ORAL_CAPSULE | Freq: Every day | ORAL | Status: DC
  Administered 2020-10-11 – 2020-10-14 (×3): 7.5 mg via ORAL
  Filled 2020-10-11 (×4): qty 1

## 2020-10-11 NOTE — Progress Notes (Signed)
Pharmacy Antibiotic Note  Todd Romero is a 87 y.o. male with positive urine and blood culture with Enterobacter aerogenes c/w a urinary source of infection. Pharmacy has been consulted for cefepime dosing. Per ID, continuing treatment for 7 days from start of cefepime on 2/1 through 2/7 (can transition to oral levaquin 750 mg every 48 hours if he is discharged prior to 2/7).   Cefepime dose still appropriate for renal function (CrCl ~24). Patient is afebrile, WBC wnl.   Plan: Continue cefepime 2g IV q24h Monitor renal function Last day of therapy 2/7  Height: 5\' 7"  (170.2 cm) Weight: 103.9 kg (229 lb 0.9 oz) IBW/kg (Calculated) : 66.1  Temp (24hrs), Avg:97.9 F (36.6 C), Min:97.9 F (36.6 C), Max:98 F (36.7 C)  Recent Labs  Lab 10/07/20 0500 10/08/20 0530 10/09/20 0415 10/10/20 0417 10/11/20 0533  WBC 14.2* 9.2 7.5 8.1 8.7  CREATININE 2.95* 2.94* 3.07* 2.70* 2.65*    Estimated Creatinine Clearance: 23.8 mL/min (A) (by C-G formula based on SCr of 2.65 mg/dL (H)).    Allergies  Allergen Reactions  . Ambien [Zolpidem Tartrate] Other (See Comments)    Severe agitation   . Bactrim [Sulfamethoxazole-Trimethoprim] Nausea And Vomiting and Other (See Comments)    Bleeding and ulcers  . Morphine And Related Nausea And Vomiting    Antimicrobials this admission: Ceftriaxone 1/31 >> 2/1 Cefepime 2/1 >>   Dose adjustments this admission: N/A  Microbiology results: 1/31 Ucx enterobacter S cefepime 1/31 Bcx enterobacter + kleb   Thank you for allowing pharmacy to be a part of this patient's care.  Rebbeca Paul, PharmD PGY1 Pharmacy Resident 10/11/2020 10:52 AM  Please check [email or web address] for unit-specific pharmacy phone numbers.

## 2020-10-11 NOTE — Progress Notes (Signed)
Patient ID: Todd Romero, male   DOB: [birth year], 87 y.o.   MRN: [id number] [facility] Progress Note   Assessment/ Plan:   1. Acute kidney Injury on chronic kidney disease stage IV: Creatinine essentially unchanged overnight with impressive urine output with ongoing diuresis.  He does not have any acute electrolyte abnormality or uremic signs/symptoms at this time prompting intervention.  Because he remains hypervolemic, will continue intravenous furosemide for another 24 hours and then convert to oral torsemide.  Per previous discussions, not a candidate for chronic hemodialysis with underlying functional limitations and cardiac status. 2.  Hyponatremia: Secondary to congestive heart failure/acute kidney injury and impaired free water handling-continue to monitor with ongoing diuresis. 3.  Hypokalemia: Secondary to ongoing diuresis, will replace via p.o. route and recheck labs. 4.  Acute exacerbation of chronic systolic heart failure: Appears to be doing symptomatically better with ongoing diuresis.  Not a candidate for spironolactone/Entresto with advanced chronic kidney disease. 5.  Enterococcus bacteremia: Suspected to be from urinary source, on cefepime with plans to transition to levofloxacin if discharged prior to 2/7.  Afebrile with normal WBC count.  Subjective:   Reports restless feeling overnight without chest pain or shortness of breath.  Would like something to help him sleep.   Objective:   BP 102/71 (BP Location: Left Arm)   Pulse (!) 57   Temp 97.9 F (36.6 C) (Oral)   Resp 18   Ht 5\' 7"  (1.702 m)   Wt 103.9 kg   SpO2 97%   BMI 35.88 kg/m   Intake/Output Summary (Last 24 hours) at 10/11/2020 0827 Last data filed at 10/11/2020 0540 Gross per 24 hour  Intake -  Output 2670 ml  Net -2670 ml   Weight change:   Physical Exam: Gen: Comfortable resting in bed, wife at bedside. CVS: Pulse regular bradycardia, S1 and S2 normal without any murmur or  rub Resp: Fine rales left base, no wheeze/rhonchi.  On oxygen via nasal cannula Abd: Soft, obese, nontender, bowel sounds normal Ext: Lower extremities in Unna boots  Imaging: No results found.  Labs: BMET Recent Labs  Lab 10/05/20 0419 10/06/20 0213 10/07/20 0500 10/08/20 0530 10/09/20 0415 10/10/20 0417 10/11/20 0533  NA 134* 131* 131* 126* 131* 134* 133*  K 3.2* 3.8 3.6 3.7 3.6 3.2* 3.3*  CL 92* 90* 92* 88* 93* 96* 96*  CO2 30 26 27 26 25 27 23   GLUCOSE 155* 323* 286* 386* 303* 166* 112*  BUN 63* 71* 87* 94* 102* 98* 98*  CREATININE 2.06* 2.33* 2.95* 2.94* 3.07* 2.70* 2.65*  CALCIUM 8.7* 8.9 8.2* 8.0* 8.3* 8.4* 8.3*  PHOS  --   --   --   --   --   --  3.6   CBC Recent Labs  Lab 10/08/20 0530 10/09/20 0415 10/10/20 0417 10/11/20 0533  WBC 9.2 7.5 8.1 8.7  HGB 8.9* 9.9* 10.3* 10.5*  HCT 26.3* 29.1* 29.7* 31.7*  MCV 88.3 88.4 88.1 88.8  PLT 156 168 212 213    Medications:    . amiodarone  200 mg Oral BID  . apixaban  2.5 mg Oral BID  . Chlorhexidine Gluconate Cloth  6 each Topical Daily  . ferrous sulfate  325 mg Oral BID WC  . furosemide  80 mg Intravenous BID  . Gerhardt's butt cream   Topical Daily  . insulin aspart  0-15 Units Subcutaneous TID WC  . insulin aspart  0-5 Units Subcutaneous QHS  . insulin aspart  5 Units Subcutaneous TID WC  . insulin NPH Human  18 Units Subcutaneous BID AC & HS  . nystatin   Topical BID  . pantoprazole  40 mg Oral BID  . pravastatin  80 mg Oral QPM  . sodium chloride flush  10-40 mL Intracatheter Q12H  . tamsulosin  0.4 mg Oral QPC supper  . traZODone  50 mg Oral QHS      Elmarie Shiley, MD 10/11/2020, 8:27 AM

## 2020-10-11 NOTE — Progress Notes (Signed)
Patient with increase agitation and anxiety throughout the night.  Patient just states that he feels weird and wants it to stop.  Patient moving feet side to side, holding on to the bedside table with left hand sliding the table back a forth while also shaking the right side of the bed rail.  Patient states he just want to stop feeling like he is however was not able to fully explain how he feels.  Patient wife states that patient will sleep for about 20-30 minutes then he is back awake saying he does not feel good.  Patient wife refused trazadone, said that previous night she felt like it made the patient's anxiety worse.  Wife requested that patient be given Xanax at midnight.  Patient wife has asked several times for the xanax to be given again since midnight dose, RN explained to patient's wife that xanax can only be given every 12 hours.  Patient wife advised that patient does not act like this at home.  RN will continue to monitor patient.

## 2020-10-11 NOTE — Progress Notes (Signed)
Patient ID: Todd Romero, male   DOB: [birth year], 87 y.o.   MRN: [id number]     Advanced Heart Failure Rounding Note  PCP-Cardiologist: Sinclair Grooms, MD   Subjective:    I/Os negative.  He is on Lasix 80 mg IV bid.  CVP 9-10 this morning.  Co-ox stable at 62% off milrinone.  BUN/creatinine slightly lower.   Blood culture/urine culture with Enterobacter aerogenes.  CXR 1/31 without PNA.  Afebrile, WBCs 8.7 today.  He is on cefepime.  He did not sleep as well last night.   Device interrogation: 34% RV pacing.  Atrial fibrillation since 09/07/19.  TEE-guided DCCV on 2/1 back to NSR but reverted to atrial fibrillation. Back to NSR 2/2.  Now with very long PR interval with a-pacing.   TEE: EF 25-30%, diffuse hypokinesis, mildly decreased RV systolic function, bioprosthetic aortic valve s/p TAVR mean gradient 9 mmHg without significant AI.    Objective:   Weight Range: 103.9 kg Body mass index is 35.88 kg/m.   Vital Signs:   Temp:  [97.9 F (36.6 C)-98 F (36.7 C)] 97.9 F (36.6 C) (02/05 0524) Pulse Rate:  [57-68] 57 (02/05 0524) Resp:  [17-18] 18 (02/04 2113) BP: (102-108)/(61-71) 102/71 (02/05 0524) SpO2:  [97 %-99 %] 97 % (02/05 0524) Last BM Date: 10/10/20  Weight change: Filed Weights   10/08/20 0500 10/08/20 1306 10/09/20 0419  Weight: 103.1 kg 103.1 kg 103.9 kg    Intake/Output:   Intake/Output Summary (Last 24 hours) at 10/11/2020 1155 Last data filed at 10/11/2020 0540 Gross per 24 hour  Intake --  Output 2670 ml  Net -2670 ml      Physical Exam    CVP 9-10 General: NAD Neck: JVP 10 cm, no thyromegaly or thyroid nodule.  Lungs: Clear to auscultation bilaterally with normal respiratory effort. CV: Nondisplaced PMI.  Heart regular S1/S2, no S3/S4, no murmur.  No peripheral edema.   Abdomen: Soft, nontender, no hepatosplenomegaly, no distention.  Skin: Intact without lesions or rashes.  Neurologic: Alert and oriented x 3.  Psych: Normal  affect. Extremities: No clubbing or cyanosis.  HEENT: Normal.    Telemetry   A-paced with markedly long PR interval, RBBB. Personally reviewed  Labs    CBC Recent Labs    10/10/20 0417 10/11/20 0533  WBC 8.1 8.7  HGB 10.3* 10.5*  HCT 29.7* 31.7*  MCV 88.1 88.8  PLT 212 144   Basic Metabolic Panel Recent Labs    10/10/20 0417 10/11/20 0533  NA 134* 133*  K 3.2* 3.3*  CL 96* 96*  CO2 27 23  GLUCOSE 166* 112*  BUN 98* 98*  CREATININE 2.70* 2.65*  CALCIUM 8.4* 8.3*  PHOS  --  3.6   Liver Function Tests Recent Labs    10/11/20 0533  ALBUMIN 2.1*   No results for input(s): LIPASE, AMYLASE in the last 72 hours. Cardiac Enzymes No results for input(s): CKTOTAL, CKMB, CKMBINDEX, TROPONINI in the last 72 hours.  BNP: BNP (last 3 results) Recent Labs    10/24/19 1913 10/01/20 1710  BNP 125.0* 433.6*    ProBNP (last 3 results) Recent Labs    11/01/19 1221 11/30/19 1342 01/30/20 1351  PROBNP 2,476* 2,277* 1,776*     D-Dimer No results for input(s): DDIMER in the last 72 hours. Hemoglobin A1C No results for input(s): HGBA1C in the last 72 hours. Fasting Lipid Panel No results for input(s): CHOL, HDL, LDLCALC, TRIG, CHOLHDL, LDLDIRECT in the last 72 hours. Thyroid  Function Tests No results for input(s): TSH, T4TOTAL, T3FREE, THYROIDAB in the last 72 hours.  Invalid input(s): FREET3  Other results:   Imaging    No results found.   Medications:     Scheduled Medications: . amiodarone  200 mg Oral BID  . apixaban  2.5 mg Oral BID  . Chlorhexidine Gluconate Cloth  6 each Topical Daily  . ferrous sulfate  325 mg Oral BID WC  . furosemide  80 mg Intravenous BID  . Gerhardt's butt cream   Topical Daily  . insulin aspart  0-15 Units Subcutaneous TID WC  . insulin aspart  0-5 Units Subcutaneous QHS  . insulin aspart  5 Units Subcutaneous TID WC  . insulin NPH Human  18 Units Subcutaneous BID AC & HS  . nystatin   Topical BID  . pantoprazole   40 mg Oral BID  . pravastatin  80 mg Oral QPM  . sodium chloride flush  10-40 mL Intracatheter Q12H  . tamsulosin  0.4 mg Oral QPC supper  . temazepam  7.5 mg Oral QHS  . traZODone  50 mg Oral QHS    Infusions: . ceFEPime (MAXIPIME) IV 2 g (10/10/20 2028)    PRN Medications: acetaminophen, ALPRAZolam, docusate sodium, ondansetron (ZOFRAN) IV, sodium chloride flush   Assessment/Plan   1. Acute on chronic systolic CHF:  Last echo in 1/21 with EF 35-40%, moderate LVH. Etiology uncertain, last cath in 2018 pre-TAVR with occluded D2 and 90% stenosis left PDA. He had MDT PPM for symptomatic bradycardia in 2/21 (after last echo).  He is RV pacing 34% of the time on device interrogation this admission. Additionally, he has been in atrial fibrillation since the beginning of January.  Echo  10/03/20 EF 30-35% RV mildly reduced TAVR ok.  TEE (2/1) with EF 25-30%, mildly decreased RV systolic function, normal function of TAVR valve. TEE-DCCV 2/1 with conversion to NSR but then back to atrial fibrillation>>converted back to NSR on amio gtt. Milrinone discontinued 2/2. Co-ox 64% today.  BUN/creatinine now trending down slowly.  CVP 9-10 with mild volume overload on exam.  - Will leave off milrinone with adequate co-ox.   - Continue Lasix 80 mg IV bid today, hopefully to po tomorrow.  - Continue Unna boots.  - Off hydralazine/Imdur with soft BP.   - No Entresto, spironolactone with AKI on CKD 4.   - He did not tolerate [place] well as outpatient.  - Cardiac amyloidosis workup => ordered PYP scan.  Myeloma panel and urine IFXN were negative.  - RV pacing 34% on initial device interrogation, however does not seem to be RV pacing much since DCCV.  Currently a-pacing with markedly long PR interval.  Ultimately, I think that he will need upgrade to CRT to allow A-V sequential pacing with markedly prolonged PR. However, will need to clear infection (send blood cultures today).  - Cannot rule out progressive  CAD as cause of worsening CHF, but would not do coronary angiography in absence of ACS without improvement in creatinine.  2. AKI on CKD stage IV: Creatinine trending down today and CVP lower. ?ATN in setting of urosepsis + diuresis.  Hopefully recovering.  - Continue IV Lasix today, then likely to torsemide tomorrow.  - Appreciate nephrology assistance.  3. Atrial fibrillation: History of PAF on amiodarone.  He has been in atrial fibrillation since early January. TEE-DCCV on 2/1 initially converted him to NSR but back to atrial fibrillation soon after.  Converted back to NSR on 2/2.  -  Continue amiodarone 200 mg bid.  - Continue Eliquis.  4. CAD: Cath in 2018 pre-TAVR with occluded D2 and 90% stenosis left PDA.  This was stable compared to prior cath and managed medically.  No chest pain or ACS this admission.  - Cannot rule out progressive CAD as cause of worsening CHF, but would not do coronary angiography in absence of ACS without improvement in creatinine.  - Continue statin.  - No ASA given Eliquis use.  5. OSA: Severe, cannot tolerate CPAP.  6. H/o TAVR: Valve stable TEE on 2/1.  7. Acute delirium/sundowning/agitation/poor sleep: Worsened with ambien and Risperdal. Bad night again.  - trazodone 50 mg qhs.  - Can use very low dose Xanax during the day for agitation/anxiety. Has helped in the past.  - Nephrology has written him for Restoril to try.  8. Physical deconditioning: Very weak.  - Continue to mobilize w/ PT/OT - He will need SNF (social work consulted).   9. ID: UTI + urosepsis with Enterobacter aerogenes urine and blood.  Recent CXR without PNA.  WBCs normal and afebrile.   - Continue cefepime to 2/7.    [name] 10/11/2020 11:55 AM

## 2020-10-12 DIAGNOSIS — I5033 Acute on chronic diastolic (congestive) heart failure: Secondary | ICD-10-CM | POA: Diagnosis not present

## 2020-10-12 LAB — COOXEMETRY PANEL
Carboxyhemoglobin: 1 % (ref 0.5–1.5)
Methemoglobin: 1.1 % (ref 0.0–1.5)
O2 Saturation: 62.3 %
Total hemoglobin: 10.8 g/dL — ABNORMAL LOW (ref 12.0–16.0)

## 2020-10-12 LAB — RENAL FUNCTION PANEL
Albumin: 2.1 g/dL — ABNORMAL LOW (ref 3.5–5.0)
Anion gap: 13 (ref 5–15)
BUN: 90 mg/dL — ABNORMAL HIGH (ref 8–23)
CO2: 25 mmol/L (ref 22–32)
Calcium: 8.6 mg/dL — ABNORMAL LOW (ref 8.9–10.3)
Chloride: 96 mmol/L — ABNORMAL LOW (ref 98–111)
Creatinine, Ser: 2.54 mg/dL — ABNORMAL HIGH (ref 0.61–1.24)
GFR, Estimated: 24 mL/min — ABNORMAL LOW (ref >60)
Glucose, Bld: 80 mg/dL (ref 70–99)
Phosphorus: 3.5 mg/dL (ref 2.5–4.6)
Potassium: 3 mmol/L — ABNORMAL LOW (ref 3.5–5.1)
Sodium: 134 mmol/L — ABNORMAL LOW (ref 135–145)

## 2020-10-12 LAB — CBC
HCT: 32.5 % — ABNORMAL LOW (ref 39.0–52.0)
Hemoglobin: 10.6 g/dL — ABNORMAL LOW (ref 13.0–17.0)
MCH: 28.9 pg (ref 26.0–34.0)
MCHC: 32.6 g/dL (ref 30.0–36.0)
MCV: 88.6 fL (ref 80.0–100.0)
Platelets: 237 10*3/uL (ref 150–400)
RBC: 3.67 MIL/uL — ABNORMAL LOW (ref 4.22–5.81)
RDW: 15 % (ref 11.5–15.5)
WBC: 9 10*3/uL (ref 4.0–10.5)
nRBC: 0 % (ref 0.0–0.2)

## 2020-10-12 LAB — GLUCOSE, CAPILLARY
Glucose-Capillary: 88 mg/dL (ref 70–99)
Glucose-Capillary: 188 mg/dL — ABNORMAL HIGH (ref 70–99)
Glucose-Capillary: 163 mg/dL — ABNORMAL HIGH (ref 70–99)
Glucose-Capillary: 144 mg/dL — ABNORMAL HIGH (ref 70–99)

## 2020-10-12 MED ORDER — POTASSIUM CHLORIDE CRYS ER 20 MEQ PO TBCR
20.0000 meq | EXTENDED_RELEASE_TABLET | Freq: Two times a day (BID) | ORAL | Status: AC
  Administered 2020-10-12 – 2020-10-14 (×4): 20 meq via ORAL
  Filled 2020-10-12 (×4): qty 1

## 2020-10-12 MED ORDER — POTASSIUM CHLORIDE CRYS ER 20 MEQ PO TBCR
40.0000 meq | EXTENDED_RELEASE_TABLET | Freq: Once | ORAL | Status: AC
  Administered 2020-10-12: 40 meq via ORAL
  Filled 2020-10-12: qty 2

## 2020-10-12 MED ORDER — HYDROCORTISONE 1 % EX CREA
TOPICAL_CREAM | Freq: Three times a day (TID) | CUTANEOUS | Status: DC | PRN
  Filled 2020-10-12: qty 28

## 2020-10-12 MED ORDER — TORSEMIDE 20 MG PO TABS
40.0000 mg | ORAL_TABLET | Freq: Two times a day (BID) | ORAL | Status: DC
  Administered 2020-10-12 – 2020-10-14 (×4): 40 mg via ORAL
  Filled 2020-10-12 (×4): qty 2

## 2020-10-12 NOTE — Progress Notes (Signed)
Patient ID: Todd Romero, male   DOB: [birth year], 87 y.o.   MRN: [id number] P    Advanced Heart Failure Rounding Note  PCP-Cardiologist: Sinclair Grooms, MD   Subjective:    I/Os negative.  He is on Lasix 80 mg IV bid, good UOP yesterday.  CVP 10 this morning.  Co-ox stable at 62% off milrinone.  BUN/creatinine trending down slowly.   Blood culture/urine culture with Enterobacter aerogenes.  CXR 1/31 without PNA.  Afebrile, WBCs normal today.  He is on cefepime.  Still not sleeping well at night but "better."   Device interrogation: 34% RV pacing.  Atrial fibrillation since 09/07/19.  TEE-guided DCCV on 2/1 back to NSR but reverted to atrial fibrillation. Back to NSR 2/2.  Now with very long PR interval with a-pacing.   TEE: EF 25-30%, diffuse hypokinesis, mildly decreased RV systolic function, bioprosthetic aortic valve s/p TAVR mean gradient 9 mmHg without significant AI.    Objective:   Weight Range: 113.4 kg Body mass index is 39.16 kg/m.   Vital Signs:   Temp:  [97.8 F (36.6 C)-98.3 F (36.8 C)] 97.8 F (36.6 C) (02/06 0911) Pulse Rate:  [70-79] 70 (02/06 0911) Resp:  [18-19] 18 (02/06 0911) BP: (100-170)/(62-79) 102/62 (02/06 0911) SpO2:  [95 %-100 %] 99 % (02/06 0911) Weight:  [113.4 kg] 113.4 kg (02/06 0459) Last BM Date: 10/10/20  Weight change: Filed Weights   10/08/20 1306 10/09/20 0419 10/12/20 0459  Weight: 103.1 kg 103.9 kg 113.4 kg    Intake/Output:   Intake/Output Summary (Last 24 hours) at 10/12/2020 1028 Last data filed at 10/12/2020 0917 Gross per 24 hour  Intake 610 ml  Output 3800 ml  Net -3190 ml      Physical Exam    CVP 10 General: NAD Neck: JVP 9-10 cm, no thyromegaly or thyroid nodule.  Lungs: Clear to auscultation bilaterally with normal respiratory effort. CV: Nondisplaced PMI.  Heart regular S1/S2, no S3/S4, no murmur. 1+ ankle edema. Abdomen: Soft, nontender, no hepatosplenomegaly, no distention.  Skin: Intact without  lesions or rashes.  Neurologic: Alert and oriented x 3.  Psych: Normal affect. Extremities: No clubbing or cyanosis.  HEENT: Normal.    Telemetry   A-paced with markedly long PR interval, RBBB. Personally reviewed  Labs    CBC Recent Labs    10/11/20 0533 10/12/20 0307  WBC 8.7 9.0  HGB 10.5* 10.6*  HCT 31.7* 32.5*  MCV 88.8 88.6  PLT 213 086   Basic Metabolic Panel Recent Labs    10/11/20 0533 10/12/20 0307  NA 133* 134*  K 3.3* 3.0*  CL 96* 96*  CO2 23 25  GLUCOSE 112* 80  BUN 98* 90*  CREATININE 2.65* 2.54*  CALCIUM 8.3* 8.6*  PHOS 3.6 3.5   Liver Function Tests Recent Labs    10/11/20 0533 10/12/20 0307  ALBUMIN 2.1* 2.1*   No results for input(s): LIPASE, AMYLASE in the last 72 hours. Cardiac Enzymes No results for input(s): CKTOTAL, CKMB, CKMBINDEX, TROPONINI in the last 72 hours.  BNP: BNP (last 3 results) Recent Labs    10/24/19 1913 10/01/20 1710  BNP 125.0* 433.6*    ProBNP (last 3 results) Recent Labs    11/01/19 1221 11/30/19 1342 01/30/20 1351  PROBNP 2,476* 2,277* 1,776*     D-Dimer No results for input(s): DDIMER in the last 72 hours. Hemoglobin A1C No results for input(s): HGBA1C in the last 72 hours. Fasting Lipid Panel No results for input(s): CHOL,  HDL, LDLCALC, TRIG, CHOLHDL, LDLDIRECT in the last 72 hours. Thyroid Function Tests No results for input(s): TSH, T4TOTAL, T3FREE, THYROIDAB in the last 72 hours.  Invalid input(s): FREET3  Other results:   Imaging    No results found.   Medications:     Scheduled Medications: . amiodarone  200 mg Oral BID  . apixaban  2.5 mg Oral BID  . Chlorhexidine Gluconate Cloth  6 each Topical Daily  . ferrous sulfate  325 mg Oral BID WC  . Gerhardt's butt cream   Topical Daily  . insulin aspart  0-15 Units Subcutaneous TID WC  . insulin aspart  0-5 Units Subcutaneous QHS  . insulin aspart  5 Units Subcutaneous TID WC  . insulin NPH Human  18 Units Subcutaneous BID  AC & HS  . nystatin   Topical BID  . pantoprazole  40 mg Oral BID  . potassium chloride  20 mEq Oral BID  . potassium chloride  40 mEq Oral Once  . pravastatin  80 mg Oral QPM  . sodium chloride flush  10-40 mL Intracatheter Q12H  . tamsulosin  0.4 mg Oral QPC supper  . temazepam  7.5 mg Oral QHS  . torsemide  40 mg Oral BID  . traZODone  50 mg Oral QHS    Infusions: . ceFEPime (MAXIPIME) IV 2 g (10/11/20 1951)    PRN Medications: acetaminophen, ALPRAZolam, docusate sodium, ondansetron (ZOFRAN) IV, sodium chloride flush   Assessment/Plan   1. Acute on chronic systolic CHF:  Last echo in 1/21 with EF 35-40%, moderate LVH. Etiology uncertain, last cath in 2018 pre-TAVR with occluded D2 and 90% stenosis left PDA. He had MDT PPM for symptomatic bradycardia in 2/21 (after last echo).  He is RV pacing 34% of the time on device interrogation this admission. Additionally, he has been in atrial fibrillation since the beginning of January.  Echo  10/03/20 EF 30-35% RV mildly reduced TAVR ok.  TEE (2/1) with EF 25-30%, mildly decreased RV systolic function, normal function of TAVR valve. TEE-DCCV 2/1 with conversion to NSR but then back to atrial fibrillation>>converted back to NSR on amio gtt. Milrinone discontinued 2/2. Co-ox 62% today.  BUN/creatinine now trending down slowly.  CVP 10 with mild volume overload on exam.  - Will leave off milrinone with adequate co-ox.   - Transition over to torsemide 40 mg bid.   - Continue Unna boots.  - Off hydralazine/Imdur with soft BP.   - No Entresto, spironolactone with AKI on CKD 4.   - He did not tolerate [place] well as outpatient.  - Cardiac amyloidosis workup => ordered PYP scan.  Myeloma panel and urine IFXN were negative.  - RV pacing 34% on initial device interrogation, however does not seem to be RV pacing much since DCCV.  Currently a-pacing with markedly long PR interval.  Ultimately, I think that he will need upgrade to CRT to allow A-V  sequential pacing given current markedly prolonged PR with a-pacing. However, will need to clear infection (repeat blood cultures NGTD).  I have asked EP to take a look at him and help me formulate a plan.  - Cannot rule out progressive CAD as cause of worsening CHF, but would not do coronary angiography in absence of ACS without improvement in creatinine.  2. AKI on CKD stage IV: Creatinine trending down today and CVP about 10. ?ATN in setting of urosepsis + diuresis.  Hopefully recovering.  - Starting torsemide today.   - Appreciate nephrology  assistance.  3. Atrial fibrillation: History of PAF on amiodarone.  He has been in atrial fibrillation since early January. TEE-DCCV on 2/1 initially converted him to NSR but back to atrial fibrillation soon after.  Converted back to NSR on 2/2.  - Continue amiodarone 200 mg bid.  - Continue Eliquis.  4. CAD: Cath in 2018 pre-TAVR with occluded D2 and 90% stenosis left PDA.  This was stable compared to prior cath and managed medically.  No chest pain or ACS this admission.  - Cannot rule out progressive CAD as cause of worsening CHF, but would not do coronary angiography in absence of ACS without improvement in creatinine.  - Continue statin.  - No ASA given Eliquis use.  5. OSA: Severe, cannot tolerate CPAP.  6. H/o TAVR: Valve stable TEE on 2/1.  7. Acute delirium/sundowning/agitation/poor sleep: Worsened with ambien and Risperdal. Bad night again.  - trazodone 50 mg qhs.  - Can use very low dose Xanax during the day for agitation/anxiety. Has helped in the past.  - Nephrology has written him for Restoril to try.  8. Physical deconditioning: Very weak.  - Continue to mobilize w/ PT/OT - He will need SNF (social work consulted).   9. ID: UTI + urosepsis with Enterobacter aerogenes urine and blood.  Recent CXR without PNA.  WBCs normal and afebrile.   - Continue cefepime to 2/7.   Hopefully ready for SNF tomorrow.   [name] 10/12/2020 10:28  AM

## 2020-10-12 NOTE — Progress Notes (Signed)
Patient ID: Todd Romero, male   DOB: 08/10/1934, 87 y.o.   MRN: [id number] [facility] Progress Note   Assessment/ Plan:   1. Acute kidney Injury on chronic kidney disease stage IV: Creatinine slightly better overnight with decent urine output with ongoing diuresis.  He does not have any acute electrolyte abnormality or uremic signs/symptoms at this time prompting intervention.  We will transition him over to oral torsemide today at 40 mg twice a day.  Per previous discussions, not a candidate for chronic hemodialysis with underlying functional limitations and cardiac status. 2.  Hyponatremia: Secondary to congestive heart failure/acute kidney injury and impaired free water handling-continue to monitor with ongoing diuresis. 3.  Hypokalemia: Secondary to ongoing diuresis, will reorder replacement via oral route. 4.  Acute exacerbation of chronic systolic heart failure: Appears to be doing symptomatically better with ongoing diuresis.  Not a candidate for spironolactone/Entresto with advanced chronic kidney disease. 5.  Enterococcus bacteremia: Suspected to be from urinary source, on cefepime with plans to transition to levofloxacin if discharged prior to 2/7.  Afebrile with normal WBC count.  Subjective:   Slept somewhat better this morning.  Denies any chest pain or shortness of breath.   Objective:   BP 102/62 (BP Location: Right Arm)   Pulse 70   Temp 97.8 F (36.6 C) (Oral)   Resp 18   Ht 5\' 7"  (1.702 m)   Wt 113.4 kg   SpO2 99%   BMI 39.16 kg/m   Intake/Output Summary (Last 24 hours) at 10/12/2020 1003 Last data filed at 10/12/2020 0917 Gross per 24 hour  Intake 610 ml  Output 3800 ml  Net -3190 ml   Weight change:   Physical Exam: Gen: Comfortable resting in bed sleeping comfortably and difficult to arouse. CVS: Pulse regular bradycardia, S1 and S2 normal without any murmur or rub Resp: Fine rales left base, no wheeze/rhonchi.  On oxygen via nasal  cannula Abd: Soft, obese, nontender, bowel sounds normal Ext: Lower extremities in Unna boots  Imaging: No results found.  Labs: BMET Recent Labs  Lab 10/06/20 0213 10/07/20 0500 10/08/20 0530 10/09/20 0415 10/10/20 0417 10/11/20 0533 10/12/20 0307  NA 131* 131* 126* 131* 134* 133* 134*  K 3.8 3.6 3.7 3.6 3.2* 3.3* 3.0*  CL 90* 92* 88* 93* 96* 96* 96*  CO2 26 27 26 25 27 23 25   GLUCOSE 323* 286* 386* 303* 166* 112* 80  BUN 71* 87* 94* 102* 98* 98* 90*  CREATININE 2.33* 2.95* 2.94* 3.07* 2.70* 2.65* 2.54*  CALCIUM 8.9 8.2* 8.0* 8.3* 8.4* 8.3* 8.6*  PHOS  --   --   --   --   --  3.6 3.5   CBC Recent Labs  Lab 10/09/20 0415 10/10/20 0417 10/11/20 0533 10/12/20 0307  WBC 7.5 8.1 8.7 9.0  HGB 9.9* 10.3* 10.5* 10.6*  HCT 29.1* 29.7* 31.7* 32.5*  MCV 88.4 88.1 88.8 88.6  PLT 168 212 213 237   Medications:    . amiodarone  200 mg Oral BID  . apixaban  2.5 mg Oral BID  . Chlorhexidine Gluconate Cloth  6 each Topical Daily  . ferrous sulfate  325 mg Oral BID WC  . furosemide  80 mg Intravenous BID  . Gerhardt's butt cream   Topical Daily  . insulin aspart  0-15 Units Subcutaneous TID WC  . insulin aspart  0-5 Units Subcutaneous QHS  . insulin aspart  5 Units Subcutaneous TID WC  . insulin NPH Human  18 Units Subcutaneous BID AC & HS  . nystatin   Topical BID  . pantoprazole  40 mg Oral BID  . potassium chloride  40 mEq Oral Once  . pravastatin  80 mg Oral QPM  . sodium chloride flush  10-40 mL Intracatheter Q12H  . tamsulosin  0.4 mg Oral QPC supper  . temazepam  7.5 mg Oral QHS  . traZODone  50 mg Oral QHS   Elmarie Shiley, MD 10/12/2020, 10:03 AM

## 2020-10-13 ENCOUNTER — Inpatient Hospital Stay (HOSPITAL_COMMUNITY): Payer: Medicare Other

## 2020-10-13 DIAGNOSIS — I5043 Acute on chronic combined systolic (congestive) and diastolic (congestive) heart failure: Secondary | ICD-10-CM | POA: Diagnosis not present

## 2020-10-13 LAB — CBC
HCT: 33.8 % — ABNORMAL LOW (ref 39.0–52.0)
Hemoglobin: 10.8 g/dL — ABNORMAL LOW (ref 13.0–17.0)
MCH: 28.6 pg (ref 26.0–34.0)
MCHC: 32 g/dL (ref 30.0–36.0)
MCV: 89.7 fL (ref 80.0–100.0)
Platelets: 244 10*3/uL (ref 150–400)
RBC: 3.77 MIL/uL — ABNORMAL LOW (ref 4.22–5.81)
RDW: 15.1 % (ref 11.5–15.5)
WBC: 9 10*3/uL (ref 4.0–10.5)
nRBC: 0 % (ref 0.0–0.2)

## 2020-10-13 LAB — GLUCOSE, CAPILLARY
Glucose-Capillary: 101 mg/dL — ABNORMAL HIGH (ref 70–99)
Glucose-Capillary: 117 mg/dL — ABNORMAL HIGH (ref 70–99)
Glucose-Capillary: 173 mg/dL — ABNORMAL HIGH (ref 70–99)
Glucose-Capillary: 153 mg/dL — ABNORMAL HIGH (ref 70–99)

## 2020-10-13 LAB — MAGNESIUM: Magnesium: 2 mg/dL (ref 1.7–2.4)

## 2020-10-13 LAB — RENAL FUNCTION PANEL
Albumin: 2.2 g/dL — ABNORMAL LOW (ref 3.5–5.0)
Anion gap: 11 (ref 5–15)
BUN: 74 mg/dL — ABNORMAL HIGH (ref 8–23)
CO2: 29 mmol/L (ref 22–32)
Calcium: 8.7 mg/dL — ABNORMAL LOW (ref 8.9–10.3)
Chloride: 94 mmol/L — ABNORMAL LOW (ref 98–111)
Creatinine, Ser: 2.28 mg/dL — ABNORMAL HIGH (ref 0.61–1.24)
GFR, Estimated: 28 mL/min — ABNORMAL LOW (ref >60)
Glucose, Bld: 97 mg/dL (ref 70–99)
Phosphorus: 3.2 mg/dL (ref 2.5–4.6)
Potassium: 3.4 mmol/L — ABNORMAL LOW (ref 3.5–5.1)
Sodium: 134 mmol/L — ABNORMAL LOW (ref 135–145)

## 2020-10-13 LAB — COOXEMETRY PANEL
Carboxyhemoglobin: 0.9 % (ref 0.5–1.5)
Methemoglobin: 1 % (ref 0.0–1.5)
O2 Saturation: 74.7 %
Total hemoglobin: 11.1 g/dL — ABNORMAL LOW (ref 12.0–16.0)

## 2020-10-13 LAB — SARS CORONAVIRUS 2 (TAT 6-24 HRS): SARS Coronavirus 2: NEGATIVE

## 2020-10-13 MED ORDER — AMIODARONE HCL 200 MG PO TABS: 200.0000 mg | ORAL_TABLET | Freq: Every day | ORAL | 5 refills | Status: AC

## 2020-10-13 MED ORDER — TORSEMIDE 20 MG PO TABS
40.0000 mg | ORAL_TABLET | Freq: Two times a day (BID) | ORAL | 5 refills | Status: DC
Start: 2020-10-13 — End: 2020-10-28

## 2020-10-13 MED ORDER — TECHNETIUM TC 99M PYROPHOSPHATE
21.8000 | Freq: Once | INTRAVENOUS | Status: AC | PRN
  Administered 2020-10-13: 21.8 via INTRAVENOUS
  Filled 2020-10-13: qty 22

## 2020-10-13 MED ORDER — POTASSIUM CHLORIDE CRYS ER 20 MEQ PO TBCR
30.0000 meq | EXTENDED_RELEASE_TABLET | Freq: Once | ORAL | Status: AC
  Administered 2020-10-13: 30 meq via ORAL
  Filled 2020-10-13: qty 1

## 2020-10-13 NOTE — TOC Progression Note (Signed)
Transition of Care [facility]) - Progression Note    Patient Details  Name: KELVON GIANNINI MRN: [id number] Date of Birth: [birth year]  Transition of Care [facility]) CM/SW [place], [place] Phone Number: 10/13/2020, 12:17 PM  Clinical Narrative:      Update 2/7 1:48pm- Patients insurance has been approved. Patients balance has been paid.   Patient has SNF bed at [facility]. Insurance authorization has been approved. Covid has been requested.  CSW will continue to follow.  CSW spoke with Misty with [facility] who confirmed that she started insurance for patient and that patient has a balance that needs to be paid so that insurance can cover his SNF stay. CSW called patients spouse [name] to let her know the balance. [name] is going to call Misty at [facility] and pay the balance. CSW awaiting callback from [facility] to confirm that everything is good for patient to transition over when medically ready.  CSW will continue to follow.   Expected Discharge Plan: [place] Barriers to Discharge: Continued Medical Work up  Expected Discharge Plan and Services Expected Discharge Plan: [place] arrangements for the past 2 months: Single Family Home                                       Social Determinants of Health (SDOH) Interventions    Readmission Risk Interventions No flowsheet data found.

## 2020-10-13 NOTE — Progress Notes (Signed)
Patient ID: Todd Romero, male   DOB: 08/17/1934, 87 y.o.   MRN: [id number] [facility] Progress Note   Assessment/ Plan:   1. Acute kidney Injury on chronic kidney disease stage IV: Patient had 4700 mL of urine output over the last 24 hours.  His creatinine is improved to 2.28 and a BUN decreasing to 74.  Continue torsemide 40 mg p.o. twice daily and then management per cardiology.  Nephrology will sign off at this time. 2.  Hyponatremia: Secondary to congestive heart failure/acute kidney injury and impaired free water handling-continue to monitor with ongoing diuresis.  Sodium improving to 134 this morning 3.  Hypokalemia: Secondary to ongoing diuresis, will reorder replacement via oral route.  Improved from yesterday but still slightly low, repleting as necessary 4.  Acute exacerbation of chronic systolic heart failure: Appears to be doing symptomatically better with ongoing diuresis.  Not a candidate for spironolactone/Entresto with advanced chronic kidney disease. 5.  Enterococcus bacteremia: Suspected to be from urinary source, on cefepime with plans to transition to levofloxacin if discharged prior to 2/7.  Afebrile with normal WBC count.  Nephrology is signing off  Subjective:   Patient did well overnight although anxious.  Today reports good urine output although he does acknowledge that he is a little thirsty today. Objective:   BP 114/78 (BP Location: Left Arm)   Pulse 70   Temp 97.7 F (36.5 C) (Oral)   Resp 16   Ht 5\' 7"  (1.702 m)   Wt 103.1 kg   SpO2 98%   BMI 35.60 kg/m   Intake/Output Summary (Last 24 hours) at 10/13/2020 1202 Last data filed at 10/13/2020 8182 Gross per 24 hour  Intake 509.8 ml  Output 2100 ml  Net -1590.2 ml   Weight change: -10.3 kg  Physical Exam: Gen: Resting comfortably in bed, no acute distress CVS: Regular rate,S1 and S2 normal without any murmur or rub Resp: Lungs clear to auscultation bilaterally, normal work of  breathing Abd: Soft, obese, nontender, bowel sounds normal Ext: Lower extremities in Unna boots, edema improved  Imaging: No results found.  Labs: BMET Recent Labs  Lab 10/07/20 0500 10/08/20 0530 10/09/20 0415 10/10/20 0417 10/11/20 0533 10/12/20 0307 10/13/20 0534  NA 131* 126* 131* 134* 133* 134* 134*  K 3.6 3.7 3.6 3.2* 3.3* 3.0* 3.4*  CL 92* 88* 93* 96* 96* 96* 94*  CO2 27 26 25 27 23 25 29   GLUCOSE 286* 386* 303* 166* 112* 80 97  BUN 87* 94* 102* 98* 98* 90* 74*  CREATININE 2.95* 2.94* 3.07* 2.70* 2.65* 2.54* 2.28*  CALCIUM 8.2* 8.0* 8.3* 8.4* 8.3* 8.6* 8.7*  PHOS  --   --   --   --  3.6 3.5 3.2   CBC Recent Labs  Lab 10/10/20 0417 10/11/20 0533 10/12/20 0307 10/13/20 0534  WBC 8.1 8.7 9.0 9.0  HGB 10.3* 10.5* 10.6* 10.8*  HCT 29.7* 31.7* 32.5* 33.8*  MCV 88.1 88.8 88.6 89.7  PLT 212 213 237 244   Medications:    . amiodarone  200 mg Oral BID  . apixaban  2.5 mg Oral BID  . Chlorhexidine Gluconate Cloth  6 each Topical Daily  . ferrous sulfate  325 mg Oral BID WC  . Gerhardt's butt cream   Topical Daily  . insulin aspart  0-15 Units Subcutaneous TID WC  . insulin aspart  0-5 Units Subcutaneous QHS  . insulin aspart  5 Units Subcutaneous TID WC  . insulin NPH Human  18 Units Subcutaneous BID AC & HS  . nystatin   Topical BID  . pantoprazole  40 mg Oral BID  . potassium chloride  30 mEq Oral Once  . potassium chloride  20 mEq Oral BID  . pravastatin  80 mg Oral QPM  . sodium chloride flush  10-40 mL Intracatheter Q12H  . tamsulosin  0.4 mg Oral QPC supper  . temazepam  7.5 mg Oral QHS  . torsemide  40 mg Oral BID  . traZODone  50 mg Oral QHS   Gifford Shave, MD  10/13/2020, 12:02 PM

## 2020-10-13 NOTE — Discharge Summary (Addendum)
Advanced Heart Failure Team  Discharge Summary   Patient ID: Todd Romero MRN: [id number], DOB/AGE: 1934-06-22 87 y.o. 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 Tamala Julian, with history of chronic combined systolic and diastolic heart failure, last echo 1/21 showed reduced EF 35 to 40% with moderate LVH.  RV normal.  Also history of severe aortic stenosis status post TAVR in 2018, symptomatic bradycardia/RBBB status post [employer] permanent pacemaker implanted 10/2019, CAD, PAF, insulin-dependent diabetes, stage IV CKD, severe OSA intolerant to CPAP, hypertension, as well as history of carpal tunnel status post bilateral carpal tunnel release.  Recently been struggling w/ NYHA Class IIIb symptoms + volume overload. Reports symptoms started shortly after placement of PPM.  Home diuretics increased w/ poor response. Symptoms progressed and he was referred to the ED on 1/26. Markedly fluid overloaded. BNP 433. CXR w/ cardiomegaly, mild pulmonary vascular congestion and trace bilateral pleural effusions. Serum creatinine 2.2 on admit, consistent with baseline. EKG V paced 65 bpm.   He was admitted by general cardiology and started on 80 IV lasix BID w/ poor initial response. He was transitioned to lasix gtt and empiric milrinone.  Diuresis improved. His device was interrogated and showed he had been in persistent atrial fibrillation since 09/06/20 w/ 34% RV pacing. Underwent TEE guided DCCV back to NSR.   TEE (2/1) with EF 25-30%, mildly decreased RV systolic function, normal function of TAVR valve.Milrinone was weaned off. Co-ox remained stable but developed AKI on CKD. SCr rose to 3.1. Nephrology consulted and assisted w/ diuretic  dosing. Volume status/ renal function improved. Transitioned back to PO diuretics, torsemide 40 mg bid.   There was also concern for ? TTR amyloid. Myeloma panel and urine IFXN were negative. PYP was completed prior to d/c. Results pending, will f/u on results in clinic.   Hospitalization also notable UTI + urosepsis with Enterobacter aerogenes urine and blood. Improved w/ abx. He will blood sugar checked AC/Hs per facility.    PT/OT worked w/ pt and recommended SNF. On 2/6, pt was last seen and examined by Dr. Aundra Dubin and felt stable for d/c to SNF. SARs 2 negative on 10/13/20. D/C to [facility] today via [facility]. PICC removed prior to d/c /   Post hospital f/u in the AHF Clinic has been arranged.   Will need followup with EP => discussed with Dr. Quentin Ore, suspect he will need CRT with high percentage (>30%) RV pacing and a-pacing with very long PR interval.  He sees Dr. Lovena Le as outpatient.  Will need followup with nephrology.  Meds for home: amiodarone 200 mg daily (decrease at discharge), Eliquis 2.5 mg bid, torsemide 40 bid, pravastatin 80 daily, KCl 20 bid.   Discharge Weight Range: 227 lb  Discharge Vitals: Blood pressure 107/62, pulse 71, temperature 97.8 F (36.6 C), temperature source Oral, resp. rate 16, height 5\' 7"  (1.702 m), weight 101.6 kg, SpO2 100 %.  Labs: Lab Results  Component Value Date   WBC 8.5 10/14/2020   HGB 10.8 (L) 10/14/2020   HCT 32.2 (L) 10/14/2020   MCV 88.5 10/14/2020   PLT 238 10/14/2020    Recent Labs  Lab 10/14/20 0431  NA 133*  K 3.6  CL 94*  CO2 28  BUN 65*  CREATININE 2.25*  CALCIUM 8.7*  GLUCOSE 85   Lab Results  Component Value Date   CHOL 170 05/30/2020   HDL 46.10 05/30/2020   LDLCALC 48 02/13/2019   TRIG 208.0 (H) 05/30/2020   BNP (last 3 results) Recent Labs    10/24/19 1913 10/01/20 1710  BNP 125.0* 433.6*    ProBNP (last 3 results) Recent Labs    11/01/19 1221 11/30/19 1342 01/30/20 1351  PROBNP 2,476* 2,277* 1,776*      Diagnostic Studies/Procedures   NM CARDIAC AMYLOID TUMOR LOC INFLAM SPECT 1 DAY  Result Date: 10/13/2020 CLINICAL DATA:  HEART FAILURE. CONCERN FOR CARDIAC AMYLOIDOSIS. 87 year old male EXAM: [place]. PYP CARDIAC AMYLOIDOSIS SCAN WITH SPECT TECHNIQUE: Following intravenous administration of radiopharmaceutical, anterior planar images of the chest were obtained. Regions of interest were placed on the heart and contralateral chest wall for quantitative assessment. Additional SPECT imaging of the chest was obtained. RADIOPHARMACEUTICALS:  21.8 mCi TECHNETIUM 99 PYROPHOSPHATE FINDINGS: Planar Visual assessment: Anterior planar imaging demonstrates radiotracer uptake within the heart less than uptake within the adjacent ribs (Grade 1). Quantitative assessment : Quantitative assessment of the cardiac uptake compared to the contralateral chest wall is equal to 1.1 (H/CL = 1.1). SPECT assessment: SPECT imaging of the chest demonstrates minimal radiotracer accumulation within the LEFT ventricle. IMPRESSION: Visual and quantitative assessment (grade 1, H/CL equal 1.1) are NOT suggestive of transthyretin amyloidosis. Electronically Signed   By: Suzy Bouchard M.D.   On: 10/13/2020 16:00    Discharge Medications   Allergies as of 10/14/2020      Reactions   Ambien [zolpidem Tartrate] Other (See Comments)   Severe agitation    Bactrim [sulfamethoxazole-trimethoprim] Nausea And Vomiting, Other (See Comments)   Bleeding and ulcers   Morphine And Related Nausea And Vomiting      Medication List    STOP taking these medications   furosemide 40 MG tablet Commonly known as: LASIX   metolazone 5 MG tablet Commonly known as: ZAROXOLYN   spironolactone 25 MG tablet Commonly known as: ALDACTONE     TAKE these medications   Accu-Chek Aviva Plus test strip Generic drug: glucose blood USE AS INSTRUCTED TO CHECK BLOOD SUGAR TWICE A DAY DX:E11.65   acetaminophen 500 MG  tablet Commonly known as: TYLENOL Take 500 mg by mouth every 4 (four) hours as needed for moderate pain or headache.   amiodarone 200 MG tablet Commonly known as: PACERONE Take 1 tablet (200 mg total) by mouth daily. What changed: how much to take   apixaban 2.5 MG Tabs tablet Commonly known as: Eliquis Take 1 tablet (2.5 mg total) by mouth 2 (two) times daily.   B-D UF III MINI PEN NEEDLES 31G X 5 MM Misc Generic drug: Insulin Pen Needle USE 2 PEN NEEDLE PER DAY WITH HUMALOG AND VICTOZA   ferrous sulfate 325 (65 FE) MG tablet Take 325 mg by mouth 2 (two) times daily with a meal.   glimepiride 2 MG tablet Commonly known as: AMARYL TAKE 1 TABLET DAILY BEFORE BREAKFAST   insulin lispro 100 UNIT/ML KwikPen Commonly known as: HumaLOG KwikPen Inject 12 Units into the skin 2 (two) times daily before lunch and supper. Inject 12 units under skin before lunch and dinner. May also inject 4 unit at breakfast is sugar reading is over 100. What changed:   how much to take  when to take this  additional instructions   insulin NPH Human 100 UNIT/ML injection Commonly known as:  HumuLIN N Inject 26 units of Humulin N under the skin once daily at bedtime.   Insulin Syringe-Needle U-100 31G X 5/16" 1 ML Misc Commonly known as: BD Insulin Syringe U/F Use to inject insulin daily. DX:E11.9   Klor-Con M20 20 MEQ tablet Generic drug: potassium chloride SA TAKE 1 TABLET BY MOUTH TWICE A DAY   multivitamin with minerals Tabs tablet Take 1 tablet by mouth daily. Mens One a day Vit   pantoprazole 40 MG tablet Commonly known as: PROTONIX Take 1 tablet (40 mg total) by mouth 2 (two) times daily.   pravastatin 80 MG tablet Commonly known as: PRAVACHOL Take 1 tablet (80 mg total) by mouth every evening.   PRESERVISION AREDS 2 PO Take 1 capsule by mouth 2 (two) times daily.   tamsulosin 0.4 MG Caps capsule Commonly known as: FLOMAX Take 1 capsule daily   torsemide 20 MG  tablet Commonly known as: DEMADEX Take 2 tablets (40 mg total) by mouth 2 (two) times daily.   Victoza 18 MG/3ML Sopn Generic drug: liraglutide Inject 1.8 mg daily   Vitamin D 50 MCG (2000 UT) tablet Take 2,000 Units by mouth daily.       Disposition   The patient will be discharged in stable condition to home. Discharge Instructions    (HEART FAILURE PATIENTS) Call MD:  Anytime you have any of the following symptoms: 1) 3 pound weight gain in 24 hours or 5 pounds in 1 week 2) shortness of breath, with or without a dry hacking cough 3) swelling in the hands, feet or stomach 4) if you have to sleep on extra pillows at night in order to breathe.   Complete by: As directed    Diet - low sodium heart healthy   Complete by: As directed    Heart Failure patients record your daily weight using the same scale at the same time of day   Complete by: As directed    Increase activity slowly   Complete by: As directed       Contact information for follow-up providers    Evans Lance, MD Follow up.   Specialty: Cardiology Why: on 10/28/2020 at 1100 for post hospital follow up Contact information: [address] [address] [phone]        [facility] Follow up on 10/22/2020.   Specialty: Cardiology Why: 1:30 PM [facility] at [facility], [place] [address]  Contact information: [address] [id number] [place] [phone]           Contact information for after-discharge care    [facility] .   Service: Skilled Nursing Contact information: [address] [place] [phone]                    Duration of Discharge Encounter: Greater than 35 minutes   Signed, Darrick Grinder NP-C  10/14/2020, 9:55 AM

## 2020-10-13 NOTE — Progress Notes (Addendum)
Patient ID: Todd Romero, male   DOB: 11-05-1933, 87 y.o.   MRN: [id number] P    Advanced Heart Failure Rounding Note  PCP-Cardiologist: Sinclair Grooms, MD   Subjective:    Wt stable at 227 lb.   Scr continues to trend down. 2.65>>2.54>>2.28   Good UOP on PO torsemide -2.1L out yesterday. K 3.4   Co-ox stable off milrinone 75%   Getting PYP scan completed today.    Device interrogation: 34% RV pacing.  Atrial fibrillation since 09/07/19.  TEE-guided DCCV on 2/1 back to NSR but reverted to atrial fibrillation. Back to NSR 2/2.  Now with very long PR interval with a-pacing.   TEE: EF 25-30%, diffuse hypokinesis, mildly decreased RV systolic function, bioprosthetic aortic valve s/p TAVR mean gradient 9 mmHg without significant AI.    Objective:   Weight Range: 103.1 kg Body mass index is 35.6 kg/m.   Vital Signs:   Temp:  [97.5 F (36.4 C)-97.8 F (36.6 C)] 97.5 F (36.4 C) (02/07 0739) Pulse Rate:  [60-70] 60 (02/07 0739) Resp:  [18-19] 18 (02/07 0739) BP: (102-106)/(62-71) 106/71 (02/07 0739) SpO2:  [99 %-100 %] 100 % (02/07 0739) Weight:  [103.1 kg] 103.1 kg (02/06 1100) Last BM Date: 10/11/20  Weight change: Filed Weights   10/09/20 0419 10/12/20 0459 10/12/20 1100  Weight: 103.9 kg 113.4 kg 103.1 kg    Intake/Output:   Intake/Output Summary (Last 24 hours) at 10/13/2020 0906 Last data filed at 10/13/2020 0549 Gross per 24 hour  Intake 519.8 ml  Output 2100 ml  Net -1580.2 ml      Physical Exam    General:  Well appearing. No respiratory difficulty HEENT: normal Neck: supple. no JVD. Carotids 2+ bilat; no bruits. No lymphadenopathy or thyromegaly appreciated. Cor: PMI nondisplaced. Regular rate & rhythm. No rubs, gallops or murmurs. Lungs: clear Abdomen: soft, nontender, nondistended. No hepatosplenomegaly. No bruits or masses. Good bowel sounds. Extremities: no cyanosis, clubbing, rash, edema Neuro: alert & oriented x 3, cranial nerves grossly  intact. moves all 4 extremities w/o difficulty. Affect pleasant.    Telemetry   A-paced with markedly long PR interval, RBBB. Personally reviewed  Labs    CBC Recent Labs    10/12/20 0307 10/13/20 0534  WBC 9.0 9.0  HGB 10.6* 10.8*  HCT 32.5* 33.8*  MCV 88.6 89.7  PLT 237 716   Basic Metabolic Panel Recent Labs    10/12/20 0307 10/13/20 0534  NA 134* 134*  K 3.0* 3.4*  CL 96* 94*  CO2 25 29  GLUCOSE 80 97  BUN 90* 74*  CREATININE 2.54* 2.28*  CALCIUM 8.6* 8.7*  PHOS 3.5 3.2   Liver Function Tests Recent Labs    10/12/20 0307 10/13/20 0534  ALBUMIN 2.1* 2.2*   No results for input(s): LIPASE, AMYLASE in the last 72 hours. Cardiac Enzymes No results for input(s): CKTOTAL, CKMB, CKMBINDEX, TROPONINI in the last 72 hours.  BNP: BNP (last 3 results) Recent Labs    10/24/19 1913 10/01/20 1710  BNP 125.0* 433.6*    ProBNP (last 3 results) Recent Labs    11/01/19 1221 11/30/19 1342 01/30/20 1351  PROBNP 2,476* 2,277* 1,776*     D-Dimer No results for input(s): DDIMER in the last 72 hours. Hemoglobin A1C No results for input(s): HGBA1C in the last 72 hours. Fasting Lipid Panel No results for input(s): CHOL, HDL, LDLCALC, TRIG, CHOLHDL, LDLDIRECT in the last 72 hours. Thyroid Function Tests No results for input(s): TSH, T4TOTAL,  T3FREE, THYROIDAB in the last 72 hours.  Invalid input(s): FREET3  Other results:   Imaging    No results found.   Medications:     Scheduled Medications: . amiodarone  200 mg Oral BID  . apixaban  2.5 mg Oral BID  . Chlorhexidine Gluconate Cloth  6 each Topical Daily  . ferrous sulfate  325 mg Oral BID WC  . Gerhardt's butt cream   Topical Daily  . insulin aspart  0-15 Units Subcutaneous TID WC  . insulin aspart  0-5 Units Subcutaneous QHS  . insulin aspart  5 Units Subcutaneous TID WC  . insulin NPH Human  18 Units Subcutaneous BID AC & HS  . nystatin   Topical BID  . pantoprazole  40 mg Oral BID  .  potassium chloride  20 mEq Oral BID  . pravastatin  80 mg Oral QPM  . sodium chloride flush  10-40 mL Intracatheter Q12H  . tamsulosin  0.4 mg Oral QPC supper  . temazepam  7.5 mg Oral QHS  . torsemide  40 mg Oral BID  . traZODone  50 mg Oral QHS    Infusions: . ceFEPime (MAXIPIME) IV 2 g (10/12/20 2015)    PRN Medications: acetaminophen, ALPRAZolam, docusate sodium, hydrocortisone cream, ondansetron (ZOFRAN) IV, sodium chloride flush   Assessment/Plan   1. Acute on chronic systolic CHF:  Last echo in 1/21 with EF 35-40%, moderate LVH. Etiology uncertain, last cath in 2018 pre-TAVR with occluded D2 and 90% stenosis left PDA. He had MDT PPM for symptomatic bradycardia in 2/21 (after last echo).  He is RV pacing 34% of the time on device interrogation this admission. Additionally, he has been in atrial fibrillation since the beginning of January.  Echo  10/03/20 EF 30-35% RV mildly reduced TAVR ok.  TEE (2/1) with EF 25-30%, mildly decreased RV systolic function, normal function of TAVR valve. TEE-DCCV 2/1 with conversion to NSR but then back to atrial fibrillation>>converted back to NSR on amio gtt. Milrinone discontinued 2/2. Co-ox 75% today.  BUN/creatinine now trending down slowly.  CVP 8-10 overnight - Will leave off milrinone with adequate co-ox.   - Continue torsemide 40 mg bid.   - Continue Unna boots.  - Off hydralazine/Imdur with soft BP.   - No Entresto, spironolactone with AKI on CKD 4.   - He did not tolerate [place] well as outpatient.  - Cardiac amyloidosis workup => PYP scan today.  Myeloma panel and urine IFXN were negative.  - RV pacing 34% on initial device interrogation, however does not seem to be RV pacing much since DCCV.  Currently a-pacing with markedly long PR interval intermixed with RV pacing.  Ultimately, I think that he will need upgrade to CRT to allow A-V sequential pacing given current markedly prolonged PR with a-pacing. However, will need to clear infection  (repeat blood cultures NGTD).  I discussed with Dr. Quentin Ore who agrees that CRT is probably his best option.  - Cannot rule out progressive CAD as cause of worsening CHF, but would not do coronary angiography in absence of ACS without improvement in creatinine.  2. AKI on CKD stage IV: Creatinine trending down today and CVP about 10. ?ATN in setting of urosepsis + diuresis.  Hopefully recovering.  - Continue PO diuretics, torsemide - Appreciate nephrology assistance.  3. Atrial fibrillation: History of PAF on amiodarone.  He has been in atrial fibrillation since early January. TEE-DCCV on 2/1 initially converted him to NSR but back to atrial fibrillation  soon after.  Converted back to NSR on 2/2.  - Continue amiodarone 200 mg bid.  - Continue Eliquis.  4. CAD: Cath in 2018 pre-TAVR with occluded D2 and 90% stenosis left PDA.  This was stable compared to prior cath and managed medically.  No chest pain or ACS this admission.  - Cannot rule out progressive CAD as cause of worsening CHF, but would not do coronary angiography in absence of ACS without improvement in creatinine.  - Continue statin.  - No ASA given Eliquis use.  5. OSA: Severe, cannot tolerate CPAP.  6. H/o TAVR: Valve stable TEE on 2/1.  7. Acute delirium/sundowning/agitation/poor sleep: Worsened with ambien and Risperdal. Bad night again.  - trazodone 50 mg qhs.  - Can use very low dose Xanax during the day for agitation/anxiety. Has helped in the past.  - Nephrology has written him for Restoril to try.  8. Physical deconditioning: Very weak.  - Continue to mobilize w/ PT/OT - He will need SNF (social work consulted).   9. ID: UTI + urosepsis with Enterobacter aerogenes urine and blood.  Recent CXR without PNA.  WBCs normal and afebrile.   - Continue cefepime, through today.   D/c to SNF when bed available.   Lyda Jester, PA-C  10/13/2020 9:06 AM  Patient seen with PA, agree with the above note.   Stable today.   Creatinine trending down, at 2.2 today.  Co-ox 75%.  CVP 7.   He looks like he is still in NSR.   PYP scan done and result pending.   General: NAD Neck: No JVD, no thyromegaly or thyroid nodule.  Lungs: Clear to auscultation bilaterally with normal respiratory effort. CV: Nondisplaced PMI.  Heart regular S1/S2, no S3/S4, no murmur.  No peripheral edema.   Abdomen: Soft, nontender, no hepatosplenomegaly, no distention.  Skin: Intact without lesions or rashes.  Neurologic: Alert and oriented x 3.  Psych: Normal affect. Extremities: No clubbing or cyanosis.  HEENT: Normal.   He can stop antibiotics today.   Continue current torsemide.   I think that he is ready for SNF today, will check with social work to see if bed available.   Will need followup in CHF clinic.  Will need followup with EP => discussed with Dr. Quentin Ore, suspect he will need CRT with high percentage (>30%) RV pacing and a-pacing with very long PR interval.  He sees Dr. Lovena Le as outpatient.  Will need followup with nephrology.  Meds for home: amiodarone 200 mg daily (decrease at discharge), Eliquis 2.5 mg bid, torsemide 40 bid, pravastatin 80 daily, KCl 20 bid.   [name] 10/13/2020 11:48 AM

## 2020-10-13 NOTE — Plan of Care (Signed)

## 2020-10-13 NOTE — Progress Notes (Signed)
Physical Therapy Treatment Patient Details Name: Todd Romero MRN: [id number] DOB: 02/01/1934 Today's Date: 10/13/2020    History of Present Illness Pt with acute on chronic systolic chf. PMH - pacer, TAVR, ckd, afib, cad, TKR, DM    PT Comments    Pt making slow, steady progress. Continue to recommend ST-SNF for further rehab.    Follow Up Recommendations  SNF     Equipment Recommendations  None recommended by PT    Recommendations for Other Services       Precautions / Restrictions Precautions Precautions: Fall    Mobility  Bed Mobility Overal bed mobility: Needs Assistance Bed Mobility: Sidelying to Sit;Rolling Rolling: Mod assist Sidelying to sit: Mod assist       General bed mobility comments: Assist to bring legs off of bed, elevate trunk into sitting, and bring hips to EOB  Transfers Overall transfer level: Needs assistance Equipment used: Rolling walker (2 wheeled) Transfers: Sit to/from [employer] Transfers Sit to Stand: +2 physical assistance;From elevated surface;+2 safety/equipment;Mod assist         General transfer comment: Assist to bring hips up and for balance. Used rocking momentum to assist. Pt with weight on back of heels and assist to shift weight forward. Verbal cues for hands. Multiple transfers bed to chair to bsc to chair.  Ambulation/Gait Ambulation/Gait assistance: Min assist;+2 safety/equipment Gait Distance (Feet): 6 Feet Assistive device: Rolling walker (2 wheeled) Gait Pattern/deviations: Step-to pattern;Decreased step length - right;Decreased step length - left;Shuffle;Trunk flexed Gait velocity: decr Gait velocity interpretation: <1.31 ft/sec, indicative of household ambulator General Gait Details: Assist for balance and support. 2nd person followed pt with chair to maximize amb distance.   Stairs             Wheelchair Mobility    Modified Rankin (Stroke Patients Only)       Balance Overall  balance assessment: Needs assistance Sitting-balance support: Bilateral upper extremity supported;Feet supported Sitting balance-Leahy Scale: Poor Sitting balance - Comments: UE support and min guard   Standing balance support: Bilateral upper extremity supported Standing balance-Leahy Scale: Poor Standing balance comment: walker and min to mod assist for static standing. Pt with posterior lean initially                            Cognition Arousal/Alertness: Awake/alert Behavior During Therapy: WFL for tasks assessed/performed Overall Cognitive Status: Impaired/Different from baseline Area of Impairment: Memory;Problem solving                     Memory: Decreased short-term memory       Problem Solving: Slow processing;Requires verbal cues        Exercises      General Comments General comments (skin integrity, edema, etc.): Pt on RA with SpO2 98% after activity. Left O2 off.      Pertinent Vitals/Pain Pain Assessment: No/denies pain    Home Living                      Prior Function            PT Goals (current goals can now be found in the care plan section) Acute Rehab PT Goals Patient Stated Goal: get stronger Progress towards PT goals: Progressing toward goals    Frequency    Min 3X/week      PT Plan Current plan remains appropriate    Co-evaluation  AM-PAC PT "6 Clicks" Mobility   Outcome Measure  Help needed turning from your back to your side while in a flat bed without using bedrails?: A Lot Help needed moving from lying on your back to sitting on the side of a flat bed without using bedrails?: A Lot Help needed moving to and from a bed to a chair (including a wheelchair)?: A Lot Help needed standing up from a chair using your arms (e.g., wheelchair or bedside chair)?: A Lot Help needed to walk in hospital room?: A Lot Help needed climbing 3-5 steps with a railing? : Total 6 Click Score: 11     End of Session Equipment Utilized During Treatment: Gait belt Activity Tolerance: Patient limited by fatigue Patient left: in chair;with call bell/phone within reach;with family/visitor present;with chair alarm set Nurse Communication: Mobility status PT Visit Diagnosis: Other abnormalities of gait and mobility (R26.89);Unsteadiness on feet (R26.81);Muscle weakness (generalized) (M62.81)     Time: 1551-1649 (10 minutes on bsc) PT Time Calculation (min) (ACUTE ONLY): 58 min  Charges:  $Gait Training: 8-22 mins $Therapeutic Activity: 23-37 mins                     [place] Pager [phone] Office [place] 10/13/2020, 5:15 PM

## 2020-10-14 DIAGNOSIS — I5043 Acute on chronic combined systolic (congestive) and diastolic (congestive) heart failure: Secondary | ICD-10-CM | POA: Diagnosis not present

## 2020-10-14 LAB — COOXEMETRY PANEL
Carboxyhemoglobin: 1 % (ref 0.5–1.5)
Methemoglobin: 1.2 % (ref 0.0–1.5)
O2 Saturation: 68.8 %
Total hemoglobin: 11.1 g/dL — ABNORMAL LOW (ref 12.0–16.0)

## 2020-10-14 LAB — GLUCOSE, CAPILLARY
Glucose-Capillary: 88 mg/dL (ref 70–99)
Glucose-Capillary: 150 mg/dL — ABNORMAL HIGH (ref 70–99)

## 2020-10-14 LAB — CBC
HCT: 32.2 % — ABNORMAL LOW (ref 39.0–52.0)
Hemoglobin: 10.8 g/dL — ABNORMAL LOW (ref 13.0–17.0)
MCH: 29.7 pg (ref 26.0–34.0)
MCHC: 33.5 g/dL (ref 30.0–36.0)
MCV: 88.5 fL (ref 80.0–100.0)
Platelets: 238 10*3/uL (ref 150–400)
RBC: 3.64 MIL/uL — ABNORMAL LOW (ref 4.22–5.81)
RDW: 15 % (ref 11.5–15.5)
WBC: 8.5 10*3/uL (ref 4.0–10.5)
nRBC: 0 % (ref 0.0–0.2)

## 2020-10-14 LAB — RENAL FUNCTION PANEL
Albumin: 2.2 g/dL — ABNORMAL LOW (ref 3.5–5.0)
Anion gap: 11 (ref 5–15)
BUN: 65 mg/dL — ABNORMAL HIGH (ref 8–23)
CO2: 28 mmol/L (ref 22–32)
Calcium: 8.7 mg/dL — ABNORMAL LOW (ref 8.9–10.3)
Chloride: 94 mmol/L — ABNORMAL LOW (ref 98–111)
Creatinine, Ser: 2.25 mg/dL — ABNORMAL HIGH (ref 0.61–1.24)
GFR, Estimated: 28 mL/min — ABNORMAL LOW (ref >60)
Glucose, Bld: 85 mg/dL (ref 70–99)
Phosphorus: 3 mg/dL (ref 2.5–4.6)
Potassium: 3.6 mmol/L (ref 3.5–5.1)
Sodium: 133 mmol/L — ABNORMAL LOW (ref 135–145)

## 2020-10-14 MED ORDER — DIPHENHYDRAMINE HCL 25 MG PO CAPS
25.0000 mg | ORAL_CAPSULE | Freq: Once | ORAL | Status: AC
  Administered 2020-10-14: 25 mg via ORAL
  Filled 2020-10-14: qty 1

## 2020-10-14 NOTE — Discharge Instructions (Signed)

## 2020-10-14 NOTE — Care Management Important Message (Signed)
Important Message  Patient Details  Name: Todd Romero MRN: [id number] Date of Birth: [birth year]   Medicare Important Message Given:  Yes     Todd Romero 10/14/2020, 11:14 AM

## 2020-10-14 NOTE — TOC Transition Note (Signed)
Transition of Care [facility]) - CM/SW Discharge Note   Patient Details  Name: Todd Romero MRN: [id number] Date of Birth: [birth year]  Transition of Care [facility]) CM/SW Contact:  Trula Ore, [place] Phone Number: 10/14/2020, 11:57 AM   Clinical Narrative:     Patient will DC to: [facility]  Anticipated DC date: 10/14/2020  Family notified: [name]   Transport by: Corey Harold   ?  Per MD patient ready for DC to [facility] . RN, patient, patient's family, and facility notified of DC. Discharge Summary sent to facility. RN given number for report tele#[phone] RM#[phone]. DC packet on chart. Ambulance transport requested for patient.  CSW signing off.    Final next level of care: Skilled Nursing Facility Barriers to Discharge: No Barriers Identified   Patient Goals and CMS Choice Patient states their goals for this hospitalization and ongoing recovery are:: to go to SNF CMS Medicare.gov Compare Post Acute Care list provided to:: Patient Represenative (must comment) (Spouse [name]) Choice offered to / list presented to : [facility]  Discharge Placement              Patient chooses bed at:  [facility]) Patient to be transferred to facility by: [place] Name of family member notified: [name] Patient and family notified of of transfer: 10/14/20  Discharge Plan and Services                                     Social Determinants of Health (SDOH) Interventions     Readmission Risk Interventions No flowsheet data found.

## 2020-10-14 NOTE — Progress Notes (Addendum)
Patient ID: Todd Romero, male   DOB: 1934/04/16, 87 y.o.   MRN: [id number] P    Advanced Heart Failure Rounding Note  PCP-Cardiologist: Sinclair Grooms, MD   Subjective:    SARS 2 negative.   Denies SOB.   Objective:   Weight Range: 101.6 kg Body mass index is 35.08 kg/m.   Vital Signs:   Temp:  [97.6 F (36.4 C)-97.8 F (36.6 C)] 97.8 F (36.6 C) (02/08 0359) Pulse Rate:  [69-71] 71 (02/08 0359) Resp:  [16-18] 16 (02/08 0359) BP: (107-114)/(62-78) 107/62 (02/08 0359) SpO2:  [98 %-100 %] 100 % (02/08 0359) Weight:  [101.6 kg] 101.6 kg (02/08 0359) Last BM Date: 10/11/20  Weight change: Filed Weights   10/12/20 0459 10/12/20 1100 10/14/20 0359  Weight: 113.4 kg 103.1 kg 101.6 kg    Intake/Output:   Intake/Output Summary (Last 24 hours) at 10/14/2020 0941 Last data filed at 10/14/2020 0200 Gross per 24 hour  Intake -  Output 4160 ml  Net -4160 ml      Physical Exam    General:   No respiratory difficulty HEENT: normal Neck: supple. no JVD. Carotids 2+ bilat; no bruits. No lymphadenopathy or thyromegaly appreciated. Cor: PMI nondisplaced. Regular rate & rhythm. No rubs, gallops or murmurs. Lungs: clear on 2 liters [place].  Abdomen: soft, nontender, nondistended. No hepatosplenomegaly. No bruits or masses. Good bowel sounds. Extremities: no cyanosis, clubbing, rash, edema Neuro: alert & oriented x 3, cranial nerves grossly intact. moves all 4 extremities w/o difficulty. Affect pleasant.    Telemetry   A paced with long PR interval. RBBB  Labs    CBC Recent Labs    10/13/20 0534 10/14/20 0432  WBC 9.0 8.5  HGB 10.8* 10.8*  HCT 33.8* 32.2*  MCV 89.7 88.5  PLT 244 831   Basic Metabolic Panel Recent Labs    10/13/20 0534 10/13/20 1000 10/14/20 0431  NA 134*  --  133*  K 3.4*  --  3.6  CL 94*  --  94*  CO2 29  --  28  GLUCOSE 97  --  85  BUN 74*  --  65*  CREATININE 2.28*  --  2.25*  CALCIUM 8.7*  --  8.7*  MG  --  2.0  --   PHOS 3.2   --  3.0   Liver Function Tests Recent Labs    10/13/20 0534 10/14/20 0431  ALBUMIN 2.2* 2.2*   No results for input(s): LIPASE, AMYLASE in the last 72 hours. Cardiac Enzymes No results for input(s): CKTOTAL, CKMB, CKMBINDEX, TROPONINI in the last 72 hours.  BNP: BNP (last 3 results) Recent Labs    10/24/19 1913 10/01/20 1710  BNP 125.0* 433.6*    ProBNP (last 3 results) Recent Labs    11/01/19 1221 11/30/19 1342 01/30/20 1351  PROBNP 2,476* 2,277* 1,776*     D-Dimer No results for input(s): DDIMER in the last 72 hours. Hemoglobin A1C No results for input(s): HGBA1C in the last 72 hours. Fasting Lipid Panel No results for input(s): CHOL, HDL, LDLCALC, TRIG, CHOLHDL, LDLDIRECT in the last 72 hours. Thyroid Function Tests No results for input(s): TSH, T4TOTAL, T3FREE, THYROIDAB in the last 72 hours.  Invalid input(s): FREET3  Other results:   Imaging    NM CARDIAC AMYLOID TUMOR LOC INFLAM SPECT 1 DAY  Result Date: 10/13/2020 CLINICAL DATA:  HEART FAILURE. CONCERN FOR CARDIAC AMYLOIDOSIS. 87 year old male EXAM: [place]. PYP CARDIAC AMYLOIDOSIS SCAN WITH SPECT TECHNIQUE: Following  intravenous administration of radiopharmaceutical, anterior planar images of the chest were obtained. Regions of interest were placed on the heart and contralateral chest wall for quantitative assessment. Additional SPECT imaging of the chest was obtained. RADIOPHARMACEUTICALS:  21.8 mCi TECHNETIUM 99 PYROPHOSPHATE FINDINGS: Planar Visual assessment: Anterior planar imaging demonstrates radiotracer uptake within the heart less than uptake within the adjacent ribs (Grade 1). Quantitative assessment : Quantitative assessment of the cardiac uptake compared to the contralateral chest wall is equal to 1.1 (H/CL = 1.1). SPECT assessment: SPECT imaging of the chest demonstrates minimal radiotracer accumulation within the LEFT ventricle. IMPRESSION: Visual and quantitative  assessment (grade 1, H/CL equal 1.1) are NOT suggestive of transthyretin amyloidosis. Electronically Signed   By: Suzy Bouchard M.D.   On: 10/13/2020 16:00     Medications:     Scheduled Medications: . amiodarone  200 mg Oral BID  . apixaban  2.5 mg Oral BID  . Chlorhexidine Gluconate Cloth  6 each Topical Daily  . ferrous sulfate  325 mg Oral BID WC  . Gerhardt's butt cream   Topical Daily  . insulin aspart  0-15 Units Subcutaneous TID WC  . insulin aspart  0-5 Units Subcutaneous QHS  . insulin aspart  5 Units Subcutaneous TID WC  . insulin NPH Human  18 Units Subcutaneous BID AC & HS  . nystatin   Topical BID  . pantoprazole  40 mg Oral BID  . pravastatin  80 mg Oral QPM  . sodium chloride flush  10-40 mL Intracatheter Q12H  . tamsulosin  0.4 mg Oral QPC supper  . temazepam  7.5 mg Oral QHS  . torsemide  40 mg Oral BID  . traZODone  50 mg Oral QHS    Infusions:   PRN Medications: acetaminophen, ALPRAZolam, docusate sodium, hydrocortisone cream, ondansetron (ZOFRAN) IV, sodium chloride flush   Assessment/Plan   1. Acute on chronic systolic CHF:  Last echo in 1/21 with EF 35-40%, moderate LVH. Etiology uncertain, last cath in 2018 pre-TAVR with occluded D2 and 90% stenosis left PDA. He had MDT PPM for symptomatic bradycardia in 2/21 (after last echo).  He is RV pacing 34% of the time on device interrogation this admission. Additionally, he has been in atrial fibrillation since the beginning of January.  Echo  10/03/20 EF 30-35% RV mildly reduced TAVR ok.  TEE (2/1) with EF 25-30%, mildly decreased RV systolic function, normal function of TAVR valve. TEE-DCCV 2/1 with conversion to NSR but then back to atrial fibrillation>>converted back to NSR on amio gtt. Milrinone discontinued 2/2. Co-ox 69% - Volume status stable.  -Continue torsemide 40 mg bid.   - Continue Unna boots.  - Off hydralazine/Imdur with soft BP.   - No Entresto, spironolactone with AKI on CKD 4.   - He did  not tolerate [place] well as outpatient.  - Cardiac amyloidosis workup => PYP scan today.  Myeloma panel and urine IFXN were negative.  - RV pacing 34% on initial device interrogation, however does not seem to be RV pacing much since DCCV.  Currently a-pacing with markedly long PR interval intermixed with RV pacing.  Ultimately, I think that he will need upgrade to CRT to allow A-V sequential pacing given current markedly prolonged PR with a-pacing. However, will need to clear infection (repeat blood cultures NGTD).  I discussed with Dr. Quentin Ore who agrees that CRT is probably his best option.  - Cannot rule out progressive CAD as cause of worsening CHF, but would not do coronary angiography  in absence of ACS without improvement in creatinine.  2. AKI on CKD stage IV: Creatinine trending down today and CVP about 10. ?ATN in setting of urosepsis + diuresis.  Hopefully recovering. Creatinine stable at 2.2 - Continue PO diuretics, torsemide - Appreciate nephrology assistance.  3. Atrial fibrillation: History of PAF on amiodarone.  He has been in atrial fibrillation since early January. TEE-DCCV on 2/1 initially converted him to NSR but back to atrial fibrillation soon after.  Converted back to NSR on 2/2. Maintaining NSR - Continue amiodarone 200 mg bid.  - Continue Eliquis.  4. CAD: Cath in 2018 pre-TAVR with occluded D2 and 90% stenosis left PDA.  This was stable compared to prior cath and managed medically.  No chest pain or ACS this admission.  - Cannot rule out progressive CAD as cause of worsening CHF, but would not do coronary angiography in absence of ACS without improvement in creatinine.  - Continue statin.  - No ASA given Eliquis use.  5. OSA: Severe, cannot tolerate CPAP.  6. H/o TAVR: Valve stable TEE on 2/1.  7. Acute delirium/sundowning/agitation/poor sleep: Worsened with ambien and Risperdal. Bad night again.  - trazodone 50 mg qhs.  - Can use very low dose Xanax during the day for  agitation/anxiety. Has helped in the past.  - Nephrology has written him for Restoril to try.  8. Physical deconditioning: Very weak.  - Continue to mobilize w/ PT/OT - He will need SNF (social work consulted).   9. ID: UTI + urosepsis with Enterobacter aerogenes urine and blood.  Recent CXR without PNA.  WBCs normal and afebrile.   - Completed antibiotics.    D/c to SNF [facility] today. [name] SARS2 negative on 2/7. Remove PICC line. TOC aware.    Darrick Grinder, NP-C  10/14/2020 9:41 AM  Agree with the above note.   Creatinine stable at 2.2.  Weight trending down.    Continue current torsemide.   I think that he is ready for SNF today, he should have a bed.  Will need followup in CHF clinic.  Will need followup with EP => discussed with Dr. Quentin Ore, suspect he will need CRT with high percentage (>30%) RV pacing and a-pacing with very long PR interval.  He sees Dr. Lovena Le as outpatient.  Will need followup with nephrology.  Meds for home: amiodarone 200 mg daily (decrease at discharge), Eliquis 2.5 mg bid, torsemide 40 bid, pravastatin 80 daily, KCl 20 bid.   [name] 10/14/2020 11:19 AM

## 2020-10-16 LAB — CULTURE, BLOOD (ROUTINE X 2)
Culture: NO GROWTH
Culture: NO GROWTH
Special Requests: ADEQUATE
Special Requests: ADEQUATE

## 2020-10-18 DIAGNOSIS — R251 Tremor, unspecified: Secondary | ICD-10-CM | POA: Insufficient documentation

## 2020-10-19 DIAGNOSIS — E1165 Type 2 diabetes mellitus with hyperglycemia: Secondary | ICD-10-CM | POA: Insufficient documentation

## 2020-10-21 ENCOUNTER — Other Ambulatory Visit: Payer: Self-pay | Admitting: Endocrinology

## 2020-10-21 DIAGNOSIS — R531 Weakness: Secondary | ICD-10-CM | POA: Insufficient documentation

## 2020-10-21 DIAGNOSIS — Z9181 History of falling: Secondary | ICD-10-CM | POA: Insufficient documentation

## 2020-10-22 ENCOUNTER — Encounter (HOSPITAL_COMMUNITY): Payer: Medicare Other

## 2020-10-22 ENCOUNTER — Other Ambulatory Visit: Payer: Medicare Other

## 2020-10-22 DIAGNOSIS — F419 Anxiety disorder, unspecified: Secondary | ICD-10-CM | POA: Insufficient documentation

## 2020-10-24 ENCOUNTER — Ambulatory Visit: Payer: Medicare Other | Admitting: Endocrinology

## 2020-10-27 NOTE — Progress Notes (Signed)
PCP: Dr Raoul Pitch EP: Dr Lovena Le Cardiology: Dr Tamala Julian  Primary HF Cardiologist: Dr Aundra Dubin   HPI: [name] is a 87 year old with a history of chronic combined systolic and diastolic heart failure, last echo 1/21 showed reduced EF 35 to 40% with moderate LVH. RV normal. Also history of severe aortic stenosis status post TAVR in2018, symptomatic bradycardia/RBBBstatus post Medtronic permanent pacemaker implanted 10/2019, CAD, PAF, insulin-dependent diabetes, stage IVCKD, severe OSAintolerant toCPAP, hypertension, as well as history of carpal tunnel status post bilateralcarpal tunnel release.  Presented to the ED on 1/26. Markedly fluid overloaded.Started on IV lasix + empiric milrinone.   His device was interrogated and showed he had been in persistent atrial fibrillation since 09/06/20 w/ 34% RV pacing. Underwent TEE guided DCCV back to NSR. TEE (2/1) with EF 25-30%, mildly decreased RV systolic function, normal function of TAVR valve. Hospital course complicated by AKI/urosepsis. Work up for amyloid was negative. Myeloma panel,urine IFXN, and PYP were negative. Will need followup with EP =>discussed with Dr. Quentin Ore, suspect he will need CRT with high percentage (>30%) RV pacing and a-pacing with very long PR interval. Discharged to SNF, [facility].   He was sent to [facility] 10/19/20 for anxiety/tremors. CT of head was negative for hemorrhage. He was sent back to SNF.  Creatinine 1.8  And K 5.1.   Today he returns for post hosptial HF follow up with his wife. Overall feeling fine. Mild SOB with exertion. Denies PND/Orthopnea. Appetite ok. No fever or chills. He has not been weighed daily at the SNF. Meds provided at facility.   Cardiac Studies Echo 10/03/20 EF 30-35% RV low normal , LA/RA severely dilated. AVR. (TAVR)  Echo 09/13/2019 EF 35-40%  Grade II DD. RV normal  Echo 2019 EF 43$   PYP 10/2020 not suggestive TTR.   Labs  10/21/20 K 5.1 Creatinine 1.8  10/14/20 K 3.6  Creatinine 2.25   ROS: All systems negative except as listed in HPI, PMH and Problem List.  SH:  Social History   Socioeconomic History  . Marital status: Married    Spouse name: Not on file  . Number of children: 4  . Years of education: Not on file  . Highest education level: Not on file  Occupational History  . Occupation: Construction-Retired  Tobacco Use  . Smoking status: Never Smoker  . Smokeless tobacco: Never Used  Vaping Use  . Vaping Use: Never used  Substance and Sexual Activity  . Alcohol use: No  . Drug use: No  . Sexual activity: Not Currently    Partners: Female  Other Topics Concern  . 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 Heart failure Father   . Emphysema Father   . COPD Father   . Early death Father   . Heart attack Father   . Hypertension Mother   .  Early death Mother   . Hyperlipidemia Mother   . Stroke Mother   . Cancer Sister   . Hypertension Sister   . Healthy Sister     Past Medical History:  Diagnosis Date  . AKI (acute kidney injury) ([place]) 10/24/2019  . Anemia   . Arthritis   . CAD (coronary artery disease)    a. cardiac cath 12/2016 showing moderate AS, elevated LVEDP, heavy 3V coronary calcification, 100% mD2, 30-50% LAD, 50-90% stenosis of Cx proximal to origin of L-PDA, RCA not engaged due to poor catheter control (difficult procedure) - coronary status was essentially unchanged from prior.   . Chicken pox   . Chronic diastolic CHF (congestive heart failure) ([place])   . Colon polyps   . Degenerative arthritis   . Diabetes ([place])   . Dyspnea   . Dyspnea on exertion 02/11/2015  . Essential hypertension   . Heart disease   . Heme positive stool 11/16/2017  . Hyperlipidemia   . New onset a-fib ([place]) 03/04/2018  . Obesity (BMI 30-39.9) 06/18/2015  . OSA (obstructive sleep apnea)    Severe with AHI 27/hr now on CPAP  not compliant with treatment.  . Peritonitis ([place]) 1985?  [name] Pneumonia    "6 months - 87 years old"  . Pure hypercholesterolemia   . RBBB   . [facility] spotted fever   . 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 Severe aortic stenosis    a. s/p TAVR 03/2017.  . Type II or unspecified type diabetes mellitus without mention of complication, not stated as uncontrolled   . Varicose vein of leg     Current Outpatient Medications  Medication Sig Dispense Refill  . ACCU-CHEK AVIVA PLUS test strip USE AS INSTRUCTED TO CHECK BLOOD SUGAR TWICE A DAY DX:E11.65 100 strip 2  . acetaminophen (TYLENOL) 500 MG tablet Take 500 mg by mouth every 4 (four) hours as needed for moderate pain or headache.    [name] amiodarone (PACERONE) 200 MG tablet Take 1 tablet (200 mg total) by mouth daily. 30 tablet 5  . apixaban (ELIQUIS) 2.5 MG TABS tablet Take 1 tablet (2.5 mg total) by mouth 2 (two) times daily. 180 tablet 1  . B-D UF III MINI PEN NEEDLES 31G X 5 MM MISC USE 2 PEN NEEDLE PER DAY WITH HUMALOG AND VICTOZA 200 each 3  . ferrous sulfate 325 (65 FE) MG tablet Take 325 mg by mouth 2 (two) times daily with a meal.     . glimepiride (AMARYL) 2 MG tablet TAKE 1 TABLET DAILY BEFORE BREAKFAST 90 tablet 3  . insulin lispro (HUMALOG KWIKPEN) 100 UNIT/ML KwikPen Inject 12 Units into the skin 2 (two) times daily before lunch and supper. Inject 12 units under skin before  lunch and dinner. May also inject 4 unit at breakfast is sugar reading is over 100. (Patient taking differently: Inject 8-16 Units into the skin 3 (three) times daily. Inject 8 units for breakfast then Take 8-16 units as needed for under skin before lunch and dinner. May also inject 4 unit at breakfast is sugar reading is over 100.) 15 mL 1  . insulin NPH Human (HUMULIN N) 100 UNIT/ML injection Inject 26 units of Humulin N under the skin once daily at bedtime. 90 mL 1  . Insulin Syringe-Needle U-100 (BD INSULIN SYRINGE U/F) 31G X 5/16" 1 ML MISC Use to inject  insulin daily. DX:E11.9 100 each 1  . KLOR-CON M20 20 MEQ tablet TAKE 1 TABLET BY MOUTH TWICE A DAY 30 tablet 5  . liraglutide (VICTOZA) 18 MG/3ML SOPN Inject 1.8 mg daily 27 mL 3  . Multiple Vitamin (MULTIVITAMIN WITH MINERALS) TABS tablet Take 1 tablet by mouth daily. Mens One a day Vit    . Multiple Vitamins-Minerals (PRESERVISION AREDS 2 PO) Take 1 capsule by mouth 2 (two) times daily.    . pantoprazole (PROTONIX) 40 MG tablet Take 1 tablet (40 mg total) by mouth 2 (two) times daily. 180 tablet 3  . pravastatin (PRAVACHOL) 80 MG tablet Take 1 tablet (80 mg total) by mouth every evening. 30 tablet 0  . tamsulosin (FLOMAX) 0.4 MG CAPS capsule Take 1 capsule daily 90 capsule 1  . torsemide (DEMADEX) 20 MG tablet Take 2 tablets (40 mg total) by mouth 2 (two) times daily. 180 tablet 5   No current facility-administered medications for this encounter.    Vitals:   10/28/20 0929  BP: 112/76  Pulse: 99  SpO2: 93%  Weight: 93.8 kg   Wt Readings from Last 3 Encounters:  10/28/20 93.8 kg  10/14/20 101.6 kg  09/18/20 105.2 kg     PHYSICAL EXAM: General: Arrived in a wheelchair.  No resp difficulty HEENT: normal Neck: supple. JVP flat. Carotids 2+ bilaterally; no bruits. No lymphadenopathy or thryomegaly appreciated. Cor: PMI normal. Regular rate & rhythm. No rubs, gallops or murmurs. Lungs: clear Abdomen: soft, nontender,  nondistended. No hepatosplenomegaly. No bruits or masses. Good bowel sounds. Extremities: no cyanosis, clubbing, rash, R and LLE trace edema around ankles.  Neuro: alert & orientedx3, cranial nerves grossly intact. Moves all 4 extremities w/o difficulty. Affect pleasant.   ECG: A-V paced 70 bpm    ASSESSMENT & PLAN: 1. Combined Systolic/Diastolic Heart Failure  Etiology uncertain, last cath in 2018 pre-TAVR with occluded D2 and 90% stenosis left PDA. He had MDT PPM for symptomatic bradycardia in 2/21 (after last echo). He is RV pacing 34% of the time on device interrogation this admission. Additionally, he has been in atrial fibrillation since the beginning of January. Echo  10/03/20 EF 30-35% RV mildly reduced TAVR ok.  TEE (2/1) with EF 25-30%, mildly decreased RV systolic function, normal function of TAVR valve. TEE-DCCV 2/1 with conversion to NSR but then back to atrial fibrillation>>converted back to NSR on amio gtt - PYP not suggestive of TTR. Discussed results today  -NYHA III. Volume status low. Cut back torsemide 40 mg daily. Stop potassium. Recent K 5.1 on 10/24/20    -Not on arni, mra with CKDI Stage IV -He did not tolerarte farxiga - Plan for possible upgrade   2. PAF TEE-DCCV on 2/1 initially converted him to NSR but back to atrial fibrillation soon after.  Converted back to NSR on 2/2 with amiodarone - Continue amio 200 mg daily - Continue eliquis 2.5 mg twice a day  - No bleeding issues.   3. CAD Cath in 2018 pre-TAVR with occluded D2 and 90% stenosis left PDA.  -On elqiuis + statin - No chest pain.   4. CKD Stager IV Follow up by Nephrology  Most recent creatinine 1.8 on 10/24/20. Improved from previous   5. Bradycardia --> PPM Medtronic  Follows with Dr Lovena Le. Has follow up next month for possible device upgrade.   Reviewed d/c summary. Provided with new recommendations.  Follow up with Dr Aundra Dubin 2 monhts.   Javontay Vandam NP-C  9:31 AM

## 2020-10-28 ENCOUNTER — Ambulatory Visit (HOSPITAL_COMMUNITY)
Admission: RE | Admit: 2020-10-28 | Discharge: 2020-10-28 | Disposition: A | Discharge status: Home / Self Care | Payer: Medicare Other | Source: Ambulatory Visit | Attending: Adult Health | Admitting: Adult Health

## 2020-10-28 ENCOUNTER — Other Ambulatory Visit: Payer: Self-pay

## 2020-10-28 ENCOUNTER — Encounter: Payer: Medicare Other | Admitting: Internal Medicine

## 2020-10-28 VITALS — BP 112/76 | HR 99 | Wt 206.8 lb

## 2020-10-28 DIAGNOSIS — I130 Hypertensive heart and chronic kidney disease with heart failure and stage 1 through stage 4 chronic kidney disease, or unspecified chronic kidney disease: Secondary | ICD-10-CM | POA: Diagnosis not present

## 2020-10-28 DIAGNOSIS — Z952 Presence of prosthetic heart valve: Secondary | ICD-10-CM | POA: Insufficient documentation

## 2020-10-28 DIAGNOSIS — R0602 Shortness of breath: Secondary | ICD-10-CM | POA: Insufficient documentation

## 2020-10-28 DIAGNOSIS — Z8249 Family history of ischemic heart disease and other diseases of the circulatory system: Secondary | ICD-10-CM | POA: Diagnosis not present

## 2020-10-28 DIAGNOSIS — E1122 Type 2 diabetes mellitus with diabetic chronic kidney disease: Secondary | ICD-10-CM | POA: Insufficient documentation

## 2020-10-28 DIAGNOSIS — I5042 Chronic combined systolic (congestive) and diastolic (congestive) heart failure: Secondary | ICD-10-CM | POA: Insufficient documentation

## 2020-10-28 DIAGNOSIS — Z794 Long term (current) use of insulin: Secondary | ICD-10-CM | POA: Diagnosis not present

## 2020-10-28 DIAGNOSIS — I2510 Atherosclerotic heart disease of native coronary artery without angina pectoris: Secondary | ICD-10-CM | POA: Diagnosis not present

## 2020-10-28 DIAGNOSIS — R001 Bradycardia, unspecified: Secondary | ICD-10-CM | POA: Diagnosis not present

## 2020-10-28 DIAGNOSIS — Z7901 Long term (current) use of anticoagulants: Secondary | ICD-10-CM | POA: Diagnosis not present

## 2020-10-28 DIAGNOSIS — I480 Paroxysmal atrial fibrillation: Secondary | ICD-10-CM | POA: Diagnosis not present

## 2020-10-28 DIAGNOSIS — Z79899 Other long term (current) drug therapy: Secondary | ICD-10-CM | POA: Insufficient documentation

## 2020-10-28 DIAGNOSIS — N189 Chronic kidney disease, unspecified: Secondary | ICD-10-CM | POA: Diagnosis not present

## 2020-10-28 DIAGNOSIS — N184 Chronic kidney disease, stage 4 (severe): Secondary | ICD-10-CM | POA: Diagnosis not present

## 2020-10-28 MED ORDER — TORSEMIDE 20 MG PO TABS: 40.0000 mg | ORAL_TABLET | Freq: Every day | ORAL | 5 refills | Status: DC

## 2020-10-28 NOTE — Patient Instructions (Addendum)
Orders sent to [place]:  DECREASE Torsemide 40mg  (2 tablets) daily  STOP Potassium  Your physician recommends that you schedule a follow-up appointment in: 8 weeks  If you have any questions or concerns before your next appointment please send [place] a message through [place] or call our office at [phone].    TO LEAVE A MESSAGE FOR THE NURSE SELECT OPTION 2, PLEASE LEAVE A MESSAGE INCLUDING: . YOUR NAME . DATE OF BIRTH . CALL BACK NUMBER . REASON FOR CALL**this is important as we prioritize the call backs  YOU WILL RECEIVE A CALL BACK THE SAME DAY AS LONG AS YOU CALL BEFORE 4:00 PM

## 2020-10-31 ENCOUNTER — Encounter (HOSPITAL_COMMUNITY): Payer: Medicare Other

## 2020-10-31 ENCOUNTER — Ambulatory Visit: Payer: Medicare Other

## 2020-10-31 DIAGNOSIS — I495 Sick sinus syndrome: Secondary | ICD-10-CM

## 2020-11-03 LAB — CUP PACEART REMOTE DEVICE CHECK
Battery Remaining Longevity: 152 mo
Battery Voltage: 3.07 V
Brady Statistic AP VP Percent: 21.34 %
Brady Statistic AP VS Percent: 77.78 %
Brady Statistic AS VP Percent: 0.11 %
Brady Statistic AS VS Percent: 0.76 %
Brady Statistic RA Percent Paced: 98.83 %
Brady Statistic RV Percent Paced: 21.46 %
Date Time Interrogation Session: 20220226153645
Implantable Lead Implant Date: 20210218
Implantable Lead Implant Date: 20210218
Implantable Lead Location: 753859
Implantable Lead Location: 753860
Implantable Lead Model: 3830
Implantable Lead Model: 5076
Implantable Pulse Generator Implant Date: 20210218
Lead Channel Impedance Value: 323 Ohm
Lead Channel Impedance Value: 342 Ohm
Lead Channel Impedance Value: 456 Ohm
Lead Channel Impedance Value: 475 Ohm
Lead Channel Pacing Threshold Amplitude: 0.875 V
Lead Channel Pacing Threshold Amplitude: 0.875 V
Lead Channel Pacing Threshold Pulse Width: 0.4 ms
Lead Channel Pacing Threshold Pulse Width: 0.4 ms
Lead Channel Sensing Intrinsic Amplitude: 1.25 mV
Lead Channel Sensing Intrinsic Amplitude: 1.25 mV
Lead Channel Sensing Intrinsic Amplitude: 9.5 mV
Lead Channel Sensing Intrinsic Amplitude: 9.5 mV
Lead Channel Setting Pacing Amplitude: 1.75 V
Lead Channel Setting Pacing Amplitude: 2.5 V
Lead Channel Setting Pacing Pulse Width: 0.4 ms
Lead Channel Setting Sensing Sensitivity: 1.2 mV

## 2020-11-05 DIAGNOSIS — Z1211 Encounter for screening for malignant neoplasm of colon: Secondary | ICD-10-CM | POA: Insufficient documentation

## 2020-11-05 DIAGNOSIS — Z86010 Personal history of colonic polyps: Secondary | ICD-10-CM | POA: Insufficient documentation

## 2020-11-05 DIAGNOSIS — K219 Gastro-esophageal reflux disease without esophagitis: Secondary | ICD-10-CM | POA: Insufficient documentation

## 2020-11-06 ENCOUNTER — Other Ambulatory Visit: Payer: Self-pay

## 2020-11-06 ENCOUNTER — Encounter: Payer: Self-pay | Admitting: Family Medicine

## 2020-11-06 ENCOUNTER — Inpatient Hospital Stay: Payer: Medicare Other | Admitting: Family Medicine

## 2020-11-06 ENCOUNTER — Ambulatory Visit (INDEPENDENT_AMBULATORY_CARE_PROVIDER_SITE_OTHER): Payer: Medicare Other | Admitting: Family Medicine

## 2020-11-06 VITALS — BP 106/61 | HR 73 | Temp 98.5°F | Wt 221.0 lb

## 2020-11-06 DIAGNOSIS — I5042 Chronic combined systolic (congestive) and diastolic (congestive) heart failure: Secondary | ICD-10-CM

## 2020-11-06 DIAGNOSIS — N1832 Chronic kidney disease, stage 3b: Secondary | ICD-10-CM

## 2020-11-06 DIAGNOSIS — R600 Localized edema: Secondary | ICD-10-CM | POA: Diagnosis not present

## 2020-11-06 DIAGNOSIS — G252 Other specified forms of tremor: Secondary | ICD-10-CM

## 2020-11-06 DIAGNOSIS — I10 Essential (primary) hypertension: Secondary | ICD-10-CM

## 2020-11-06 LAB — RENAL FUNCTION PANEL
Albumin: 3.4 g/dL — ABNORMAL LOW (ref 3.5–5.2)
BUN: 40 mg/dL — ABNORMAL HIGH (ref 6–23)
CO2: 29 mEq/L (ref 19–32)
Calcium: 8.5 mg/dL (ref 8.4–10.5)
Chloride: 99 mEq/L (ref 96–112)
Creatinine, Ser: 1.77 mg/dL — ABNORMAL HIGH (ref 0.40–1.50)
GFR: 34.8 mL/min — ABNORMAL LOW (ref >60.00)
Glucose, Bld: 116 mg/dL — ABNORMAL HIGH (ref 70–99)
Phosphorus: 3.9 mg/dL (ref 2.3–4.6)
Potassium: 4 mEq/L (ref 3.5–5.1)
Sodium: 136 mEq/L (ref 135–145)

## 2020-11-06 LAB — CBC WITH DIFFERENTIAL/PLATELET
Basophils Absolute: 0 10*3/uL (ref 0.0–0.1)
Basophils Relative: 0.5 % (ref 0.0–3.0)
Eosinophils Absolute: 0.2 10*3/uL (ref 0.0–0.7)
Eosinophils Relative: 2.8 % (ref 0.0–5.0)
HCT: 31.1 % — ABNORMAL LOW (ref 39.0–52.0)
Hemoglobin: 10.3 g/dL — ABNORMAL LOW (ref 13.0–17.0)
Lymphocytes Relative: 21 % (ref 12.0–46.0)
Lymphs Abs: 1.5 10*3/uL (ref 0.7–4.0)
MCHC: 33.2 g/dL (ref 30.0–36.0)
MCV: 87.8 fl (ref 78.0–100.0)
Monocytes Absolute: 0.8 10*3/uL (ref 0.1–1.0)
Monocytes Relative: 10.8 % (ref 3.0–12.0)
Neutro Abs: 4.6 10*3/uL (ref 1.4–7.7)
Neutrophils Relative %: 64.9 % (ref 43.0–77.0)
Platelets: 135 10*3/uL — ABNORMAL LOW (ref 150.0–400.0)
RBC: 3.55 Mil/uL — ABNORMAL LOW (ref 4.22–5.81)
RDW: 17.3 % — ABNORMAL HIGH (ref 11.5–15.5)
WBC: 7 10*3/uL (ref 4.0–10.5)

## 2020-11-06 LAB — BRAIN NATRIURETIC PEPTIDE: Pro B Natriuretic peptide (BNP): 630 pg/mL — ABNORMAL HIGH (ref 0.0–100.0)

## 2020-11-06 NOTE — Progress Notes (Signed)
This visit occurred during the SARS-CoV-2 public health emergency.  Safety protocols were in place, including screening questions prior to the visit, additional usage of staff PPE, and extensive cleaning of exam room while observing appropriate contact time as indicated for disinfecting solutions.    Todd Romero , 02/19/1934, 87 y.o., male MRN: [id number] Patient Care Team    Relationship Specialty Notifications Start End  Ma Hillock, DO PCP - General Family Medicine  09/13/19   Belva Crome, MD PCP - Cardiology Cardiology  10/21/17   [name], MD Consulting Physician Endocrinology  09/25/19   Thompson Grayer, MD Consulting Physician Cardiology  10/01/19   Danice Goltz, MD Consulting Physician Ophthalmology  10/01/19   Monna Fam, MD Consulting Physician Ophthalmology  10/01/19   Carol Ada, MD Consulting Physician Gastroenterology  10/01/19   Evans Lance, MD Consulting Physician Cardiology  11/08/19     Chief Complaint  Patient presents with  . Hospitalization Follow-up     Subjective: Todd Romero is a 87 y.o. male present  for an OV to discuss his recent hospitalizations.  He has followed up with his cardiologist since hospitalization.  He was hospitalized 10/01/2020 with CHF.  He then was seen at  [facility] in [place] ED really after he was discharged from his rehabilitation services.  He was seen in the ED for a tremor.  He was concerned it was either anxiety or strokelike symptoms.  A CT had been completed and showed a hyperdense focus within the right posterior frontal region an MRI was warranted for further evaluation.  He needs to be transferred to another facility in order to have MRI secondary to his pacemaker in place.  MRI of final result was without concern felt to be more of a small cavernous malformation or small focus of dystrophic calcification.  Did not appear to be consistent with acute hemorrhage or any acute intracranial  abnormality of concern.  Patient reports since all of his hospital discharge he is still a little confused about his diuretics.  They took him off of furosemide and placed him on torsemide initially twice daily.  Then it was decreased to torsemide once daily when he saw his cardiologist nurse practitioner.  He is uncertain if he is supposed to take his Zaroxolyn or spironolactone any longer.  During his hospital stay at some point he was also started on metoprolol 25 mg daily which he states he has continued. Patient is returning to his normal activities.  He denies any shortness of breath.  He has gained some weight back.  He does not routinely monitor his weight.  He is uncertain what his dry weight is supposed to be.   Depression screen [facility] 2/9 11/06/2020 05/06/2017  Decreased Interest 0 0  Down, Depressed, Hopeless 0 0  PHQ - 2 Score 0 0  Some recent data might be hidden    Allergies  Allergen Reactions  . Ambien [Zolpidem Tartrate] Other (See Comments)    Severe agitation   . Other Other (See Comments)  . Bactrim [Sulfamethoxazole-Trimethoprim] Nausea And Vomiting and Other (See Comments)    Bleeding and ulcers  . 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 AKI (acute kidney injury) ([place]) 10/24/2019  . Anemia   . Arthritis   . CAD (coronary artery disease)    a. cardiac cath 12/2016 showing moderate AS, elevated LVEDP, heavy 3V coronary calcification, 100% mD2, 30-50% LAD, 50-90% stenosis of Cx proximal to origin of L-PDA, RCA not engaged due to poor catheter control (difficult procedure) - coronary status was essentially unchanged from prior.  . Chicken pox   . Chronic diastolic CHF  (congestive heart failure) ([place])   . Colon polyps   . Degenerative arthritis   . Diabetes ([place])   . Dyspnea   . Dyspnea on exertion 02/11/2015  . Essential hypertension   . Heart disease   . Heme positive stool 11/16/2017  . Hyperlipidemia   . Intermediate stage nonexudative age-related macular degeneration of both eyes 02/27/2015  . Leukocytosis 11/16/2017  . New onset a-fib ([place]) 03/04/2018  . Obesity (BMI 30-39.9) 06/18/2015  . OSA (obstructive sleep apnea)    Severe with AHI 27/hr now on CPAP  not compliant with treatment.  . Peritonitis ([place]) 1985?  [name] Pneumonia    "6 months - 87 years old"  . Posterior vitreous detachment, left 03/02/2016  . Pseudophakia of both eyes 03/02/2016  . Pure hypercholesterolemia   . RBBB   . [facility] spotted fever   . 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 Severe aortic stenosis    a. s/p TAVR 03/2017.  [name] Symptomatic bradycardia 10/24/2019  . Type II or unspecified type diabetes mellitus without mention of complication, not stated as uncontrolled   . Varicose vein of leg    Past Surgical History:  Procedure Laterality Date  . APPENDECTOMY  1985   peritonitis  . CARDIOVERSION N/A 04/03/2018   Procedure: CARDIOVERSION;  Surgeon: Josue Hector, MD;  Location: [facility] ENDOSCOPY;  Service: Cardiovascular;  Laterality: N/A;  . CARDIOVERSION N/A 01/18/2019   Procedure: CARDIOVERSION;  Surgeon: Skeet Latch, MD;  Location: [place];  Service: Cardiovascular;  Laterality: N/A;  . CARDIOVERSION N/A 10/07/2020   Procedure: CARDIOVERSION;  Surgeon: Larey Dresser, MD;  Location: [facility] ENDOSCOPY;  Service: Cardiovascular;  Laterality: N/A;  . CARPAL TUNNEL RELEASE    . COLONOSCOPY WITH PROPOFOL N/A 11/18/2017   Procedure: COLONOSCOPY WITH PROPOFOL;  Surgeon: Carol Ada, MD;  Location: [facility] ENDOSCOPY;  Service: Endoscopy;   Laterality: N/A;  . ESOPHAGOGASTRODUODENOSCOPY N/A 11/18/2017   Procedure: ESOPHAGOGASTRODUODENOSCOPY (EGD);  Surgeon: Carol Ada, MD;  Location: [facility] ENDOSCOPY;  Service: Endoscopy;  Laterality: N/A;  . EYE SURGERY Bilateral    cataracts removed, lens placed  . JOINT REPLACEMENT    . KNEE CARTILAGE SURGERY    . PACEMAKER IMPLANT N/A 10/25/2019   Procedure: PACEMAKER IMPLANT;  Surgeon: Evans Lance, MD;  Location: [place] CV LAB;  Service: Cardiovascular;  Laterality: N/A;  . PARTIAL COLECTOMY    . RIGHT/LEFT HEART CATH AND CORONARY ANGIOGRAPHY N/A 12/28/2016   Procedure: Right/Left Heart Cath and Coronary Angiography;  Surgeon: Belva Crome, MD;  Location: [place] CV LAB;  Service: Cardiovascular;  Laterality: N/A;  . TEE WITHOUT CARDIOVERSION N/A 03/15/2017   Procedure: TRANSESOPHAGEAL ECHOCARDIOGRAM (TEE);  Surgeon: Burnell Blanks, MD;  Location: [place];  Service: Open Heart Surgery;  Laterality: N/A;  . TEE WITHOUT CARDIOVERSION N/A 10/07/2020   Procedure: TRANSESOPHAGEAL  ECHOCARDIOGRAM (TEE);  Surgeon: Larey Dresser, MD;  Location: [facility] ENDOSCOPY;  Service: Cardiovascular;  Laterality: N/A;  . TONSILLECTOMY AND ADENOIDECTOMY  1942  . TOTAL KNEE ARTHROPLASTY Right 11/08/2017  . TOTAL KNEE ARTHROPLASTY Right 11/08/2017   Procedure: TOTAL KNEE ARTHROPLASTY;  Surgeon: Melrose Nakayama, MD;  Location: [place];  Service: Orthopedics;  Laterality: Right;  . TRANSCATHETER AORTIC VALVE REPLACEMENT, TRANSFEMORAL N/A 03/15/2017   Procedure: TRANSCATHETER AORTIC VALVE REPLACEMENT, TRANSFEMORAL;  Surgeon: Burnell Blanks, MD;  Location: [place];  Service: Open Heart Surgery;  Laterality: N/A;  . VEIN SURGERY  1980   Family History  Problem Relation Age of Onset  . Heart failure Father   . Emphysema Father   . COPD Father   . Early death Father   . Heart attack Father   . Hypertension Mother   . Early death Mother   . Hyperlipidemia Mother   . Stroke Mother   . Cancer Sister    . Hypertension Sister   . Healthy Sister    Allergies as of 11/06/2020      Reactions   Ambien [zolpidem Tartrate] Other (See Comments)   Severe agitation    Other Other (See Comments)   Bactrim [sulfamethoxazole-trimethoprim] Nausea And Vomiting, Other (See Comments)   Bleeding and ulcers   Morphine And Related Nausea And Vomiting      Medication List       Accurate as of November 06, 2020 11:59 PM. If you have any questions, ask your nurse or doctor.        STOP taking these medications   Farxiga 5 MG Tabs tablet Generic drug: dapagliflozin propanediol Stopped by: Howard Pouch, DO   metolazone 5 MG tablet Commonly known as: ZAROXOLYN Stopped by: Howard Pouch, DO   potassium chloride 10 MEQ tablet Commonly known as: KLOR-CON Stopped by: Howard Pouch, DO     TAKE these medications   Accu-Chek Aviva Plus test strip Generic drug: glucose blood USE AS INSTRUCTED TO CHECK BLOOD SUGAR TWICE A DAY DX:E11.65   acetaminophen 500 MG tablet Commonly known as: TYLENOL Take 500 mg by mouth every 4 (four) hours as needed for moderate pain or headache.   amiodarone 200 MG tablet Commonly known as: PACERONE Take 1 tablet (200 mg total) by mouth daily.   apixaban 2.5 MG Tabs tablet Commonly known as: Eliquis Take 1 tablet (2.5 mg total) by mouth 2 (two) times daily.   B-D UF III MINI PEN NEEDLES 31G X 5 MM Misc Generic drug: Insulin Pen Needle USE 2 PEN NEEDLE PER DAY WITH HUMALOG AND VICTOZA   ferrous sulfate 325 (65 FE) MG tablet Take 325 mg by mouth 2 (two) times daily with a meal.   glimepiride 2 MG tablet Commonly known as: AMARYL TAKE 1 TABLET DAILY BEFORE BREAKFAST   insulin lispro 100 UNIT/ML KwikPen Commonly known as: HumaLOG KwikPen Inject 12 Units into the skin 2 (two) times daily before lunch and supper. Inject 12 units under skin before lunch and dinner. May also inject 4 unit at breakfast is sugar reading is over 100. What changed:   how much to  take  when to take this  additional instructions   insulin NPH Human 100 UNIT/ML injection Commonly known as: HumuLIN N Inject 26 units of Humulin N under the skin once daily at bedtime.   Insulin Syringe-Needle U-100 31G X 5/16" 1 ML Misc Commonly known as: BD Insulin Syringe U/F Use to inject insulin daily. DX:E11.9   metoprolol succinate  25 MG 24 hr tablet Commonly known as: TOPROL-XL Take by mouth.   multivitamin with minerals Tabs tablet Take 1 tablet by mouth daily. Mens One a day Vit   pantoprazole 40 MG tablet Commonly known as: PROTONIX Take 1 tablet (40 mg total) by mouth 2 (two) times daily.   pravastatin 80 MG tablet Commonly known as: PRAVACHOL Take 1 tablet (80 mg total) by mouth every evening.   PRESERVISION AREDS 2 PO Take 1 capsule by mouth 2 (two) times daily.   tamsulosin 0.4 MG Caps capsule Commonly known as: FLOMAX Take 1 capsule daily   torsemide 20 MG tablet Commonly known as: DEMADEX Take 2 tablets (40 mg total) by mouth daily.   Victoza 18 MG/3ML Sopn Generic drug: liraglutide Inject 1.8 mg daily       All past medical history, surgical history, allergies, family history, immunizations andmedications were updated in the EMR today and reviewed under the history and medication portions of their EMR.     ROS: Negative, with the exception of above mentioned in HPI   Objective:  BP 106/61   Pulse 73   Temp 98.5 F (36.9 C) (Oral)   Wt 221 lb (100.2 kg)   SpO2 99%   BMI 34.61 kg/m  Body mass index is 34.61 kg/m. Gen: Afebrile. No acute distress. Nontoxic in appearance, well developed, well nourished.  Pleasant man. HENT: AT. [place].  No cough or hoarseness present Eyes:Pupils Equal Round Reactive to light, Extraocular movements intact,  Conjunctiva without redness, discharge or icterus. CV: RRR , +1-2 pitting edema bilateral lower extremities up to knee. Chest: CTAB, no wheeze or crackles. Good air movement, normal resp effort.  Skin:  Warm and well-perfused.  Purpura present over right foot. Neuro: Unchanged gait. PERLA. EOMi. Alert. Oriented x3  Psych: Normal affect, dress and demeanor. Normal speech. Normal thought content and judgment.  No exam data present No results found. Results for orders placed or performed in visit on 11/06/20 (from the past 24 hour(s))  LAB REPORT - SCANNED     Status: None   Collection Time: 11/07/20  1:54 PM   Narrative   Ordered by an unspecified provider.    Assessment/Plan: Todd Romero is a 87 y.o. male present for OV for  Stage 3b chronic kidney disease ([place]) - Renal Function Panel - CBC w/Diff  tremors Resolved.  Patient reports that self resolved and is felt to be from anxiety.  CT scan and MRI was negative for acute causes.  Chronic combined systolic and diastolic CHF (congestive heart failure) (HCC)/Essential hypertension, benign/lower extremity edema Patient was encouraged to monitor a dry weight every morning before eating or drinking anything.  If he gains 3-5 pounds overnight he should call his cardiologist for instructions on diuretic.  Dry weight probably around 215 pounds on his normal diet.  Although, he had been diuresed down to 206 pounds 10/28/2020.  Today's weight is 221. -He does appear to be fluid overloaded today in his lower extremities.  Lungs are clear. - B Nat Peptide> greater than 600> patient was encouraged to call his cardiologist for recommendations on diuretic.  Per hospital discharge instructions it appears his Zaroxolyn and spironolactone were discontinued.  Furosemide was also decreased to once daily.     Reviewed expectations re: course of current medical issues.  Discussed self-management of symptoms.  Outlined signs and symptoms indicating need for more acute intervention.  Patient verbalized understanding and all questions were answered.  Patient received an After-Visit Summary.  Orders Placed This Encounter  Procedures  .  Renal Function Panel  . CBC w/Diff  . B Nat Peptide  . LAB REPORT - SCANNED   No orders of the defined types were placed in this encounter.  Referral Orders  No referral(s) requested today    > 45 Minutes was dedicated to this patient's encounter to include pre-visit review of chart, face-to-face time with patient and post-visit work- which include documentation and prescribing medications and/or ordering test when necessary.     Note is dictated utilizing voice recognition software. Although note has been proof read prior to signing, occasional typographical errors still can be missed. If any questions arise, please do not hesitate to call for verification.   electronically signed by:  Howard Pouch, DO  [place]

## 2020-11-06 NOTE — Patient Instructions (Addendum)
I am glad you are feeling better.  Make sure to take your dry weight every morning. 220# is close to dry weight if you gained weight from eating since discharge from hospital. You weighed 206 on hospital discharge.   If > 3-5 pounds overnight you should call your cardiologist for instructions on increasing diuretic.    We will call you with lab results.

## 2020-11-07 ENCOUNTER — Inpatient Hospital Stay: Payer: Medicare Other | Admitting: Family Medicine

## 2020-11-07 ENCOUNTER — Encounter: Payer: Self-pay | Admitting: Family Medicine

## 2020-11-07 ENCOUNTER — Telehealth: Payer: Self-pay | Admitting: Interventional Cardiology

## 2020-11-07 ENCOUNTER — Telehealth: Payer: Self-pay

## 2020-11-07 NOTE — Telephone Encounter (Signed)
Santiago Glad from [facility] requesting Occupational Therapy for patient  ABL Training Transfer Training Home Safety  Please call Santiago Glad at [phone].  Because of time of call 4:49PM 11/07/20; Santiago Glad will not be followed up with until Monday 11/10/20.

## 2020-11-07 NOTE — Telephone Encounter (Signed)
Spoke with Santiago Glad and informed her to reach out to cardiology since we did not initiate the HM order

## 2020-11-07 NOTE — Telephone Encounter (Signed)
Pt c/o swelling: STAT is pt has developed SOB within 24 hours  1) How much weight have you gained and in what time span?  210 on 2/26, he is 216 today 3/4  2) If swelling, where is the swelling located? Mainly in the leg and a little in the lower stomach   3) Are you currently taking a fluid pill? Yes 40mg  daily   4) Are you currently SOB? No SOB   5) Do you have a log of your daily weights (if so, list)? No   6) Have you gained 3 pounds in a day or 5 pounds in a week? Yes   7) Have you traveled recently? No   Pt come out or rehab on 2/26.  He has started retaining fluid since he got out of the [place] number [phone]

## 2020-11-07 NOTE — Progress Notes (Signed)
Remote pacemaker transmission.   

## 2020-11-07 NOTE — Telephone Encounter (Signed)
Spoke with pt and pt's wife and notes edema to leg and belly no other symptoms Per wife Torsemide was decreased to 40 mg daily from 80 mg daily and noted gradual swelling since decrease.of med Will forward to Darrick Grinder NP for review and recommendations ./cy

## 2020-11-10 ENCOUNTER — Telehealth: Payer: Self-pay | Admitting: Interventional Cardiology

## 2020-11-10 ENCOUNTER — Other Ambulatory Visit: Payer: Self-pay | Admitting: Endocrinology

## 2020-11-10 NOTE — Telephone Encounter (Signed)
Okay to approve but he needs to make follow-up appointment in April with labs

## 2020-11-10 NOTE — Telephone Encounter (Signed)
Please call and advise to increase torsemide to 60 mg daily   Will need BMET in 7 days.  Orel Hord 12:40 PM

## 2020-11-10 NOTE — Telephone Encounter (Signed)
Pt recently hospitalized for HF.  When he left [facility], he went to rehab in [place].  Had to be hospitalized again while there but was sent to [facility].  When discharged from [facility] his weight was 210lbs.  Weight 3/4 was 216lbs and today is 220lbs.  Swelling in feet and under his belly.  Denies SOB when sitting upright but did get SOB when inclined too much in recliner.  Currently on Torsemide 40mg  QD.  Was previously on Torsemide 40mg  BID, Metolazone once weekly and Spironolactone 12.5mg  MWF.  Creatinine elevated in the hospital, so meds were changed.  Creatinine was done 3/4 by PCP and was back down to 1.77.  Advised I will send to Dr. Tamala Julian for review and advisement.

## 2020-11-10 NOTE — Telephone Encounter (Signed)
Spoke with pts wife she is aware and verbalized understanding. Order for labs mailed to patients address.

## 2020-11-10 NOTE — Telephone Encounter (Signed)
LOV 07/16/20. Please advise

## 2020-11-10 NOTE — Telephone Encounter (Signed)
Pt c/o swelling: STAT is pt has developed SOB within 24 hours  1) How much weight have you gained and in what time span? Fours days about 5 pounds  2) If swelling, where is the swelling located? Legs and underneath the belly  3) Are you currently taking a fluid pill? yes  4) Are you currently SOB? Not to bad  5) Do you have a log of your daily weights (if so, list)? Yes 210 to 220 about 7 to 10 days  6) Have you gained 3 pounds in a day or 5 pounds in a week? Yes   7) Have you traveled recently? Patient was in rehab till 26 of Feb

## 2020-11-11 NOTE — Telephone Encounter (Signed)
Increase torsemide to 40 mg twice daily until weight is back to baseline.  Then change to torsemide 60 mg once daily.  met in 7 to 10 days.  He also needs to report these complaints to the advanced heart failure clinic.

## 2020-11-11 NOTE — Telephone Encounter (Signed)
Spoke with wife and made her aware of recommendations per Dr. Tamala Julian.  She states someone from the [facility] called yesterday and advised that pt take Torsemide 60mg  QD.  She mentions that pt took this dose yesterday and still had issues with breathing last night, despite increased urination.  We discussed that he likely still had a lot of fluid even though he had increased urination because he had gained a fair amount of weight.  She states they would like to move forward with Dr. Thompson Caul recommendation and get labs next week because he is so uncomfortable.  Advised if no improvement, reach out to HF team for guidance and keep appt with Dr. Aundra Dubin 4/19.  Wife appreciative for call. Wife mentioned that HF team was sending paperwork for pt to go to a local [facility] to have labs drawn.  Advised her to continue with this plan.

## 2020-11-11 NOTE — Telephone Encounter (Signed)
LVM--to call the office back to set an appt. Routed the message front staff. Refilled Rx humalog to the pharmacy.

## 2020-11-13 ENCOUNTER — Other Ambulatory Visit: Payer: Self-pay | Admitting: Interventional Cardiology

## 2020-11-14 LAB — ECHO TEE
AV Mean grad: 9 mmHg
AV Peak grad: 16.6 mmHg
Ao pk vel: 2.04 m/s

## 2020-11-18 ENCOUNTER — Telehealth (HOSPITAL_COMMUNITY): Payer: Self-pay | Admitting: *Deleted

## 2020-11-18 ENCOUNTER — Other Ambulatory Visit (HOSPITAL_COMMUNITY): Payer: Self-pay | Admitting: Adult Health

## 2020-11-18 NOTE — Telephone Encounter (Signed)
pts wife left VM stating pts weight is now 227 lbs and he has swelling in legs and feet. Pt is taking all medications as prescribed and no change in his diet. No other complaints at this time.  Routed to [employer], NP-c for advice

## 2020-11-18 NOTE — Telephone Encounter (Signed)
Please ask to add compression stockings.   Riven Beebe NP-C  11:15 AM

## 2020-11-18 NOTE — Telephone Encounter (Signed)
Pts wife aware and agreeable with plan.  

## 2020-11-19 ENCOUNTER — Telehealth (HOSPITAL_COMMUNITY): Payer: Self-pay | Admitting: Cardiology

## 2020-11-19 ENCOUNTER — Telehealth: Payer: Self-pay | Admitting: Interventional Cardiology

## 2020-11-19 LAB — BASIC METABOLIC PANEL
BUN/Creatinine Ratio: 28 — ABNORMAL HIGH (ref 10–24)
BUN: 53 mg/dL — ABNORMAL HIGH (ref 8–27)
CO2: 20 mmol/L (ref 20–29)
Calcium: 8.7 mg/dL (ref 8.6–10.2)
Chloride: 98 mmol/L (ref 96–106)
Creatinine, Ser: 1.9 mg/dL — ABNORMAL HIGH (ref 0.76–1.27)
Glucose: 146 mg/dL — ABNORMAL HIGH (ref 65–99)
Potassium: 4.4 mmol/L (ref 3.5–5.2)
Sodium: 138 mmol/L (ref 134–144)
eGFR: 34 mL/min/{1.73_m2} — ABNORMAL LOW (ref >59)

## 2020-11-19 LAB — SPECIMEN STATUS REPORT

## 2020-11-19 NOTE — Telephone Encounter (Signed)
pts wife reports he was instructed to take torsemide 40 bid be Dr Tamala Julian on 3/8   Per Amy Ninfa Meeker, Increase to 80 BID x2 days return 3/18 for bmet/reds/?remote health referral  Pts wife aware and voiced understanding

## 2020-11-19 NOTE — Telephone Encounter (Signed)
Diuretic is again not working orally. He needs IV diuresis in hospital. See ig HF team can take him. Otherwise he should come to ER.

## 2020-11-19 NOTE — Telephone Encounter (Signed)
Pt c/o swelling: STAT is pt has developed SOB within 24 hours  1) How much weight have you gained and in what time span? 25 lbs in 2 1/2 lbs  2) If swelling, where is the swelling located? Both legs, right leg is worse  3) Are you currently taking a fluid pill? Yes   4) Are you currently SOB? Yes  5) Do you have a log of your daily weights (if so, list)? Yes   6) Have you gained 3 pounds in a day or 5 pounds in a week? Yes   7) Have you traveled recently? No

## 2020-11-19 NOTE — Telephone Encounter (Signed)
Called HF triage line and left message for them to call our office back.

## 2020-11-19 NOTE — Telephone Encounter (Signed)
   Please increase torsemide to 40 mg twice a day x2 days. Follow up on Friday for nurse visit with [facility] and BMET.    If remains overloaded may need to refer to Remote Health.   Jerick Khachatryan NP-C  4:30 PM

## 2020-11-19 NOTE — Telephone Encounter (Signed)
See other phone note from HF clinic.

## 2020-11-19 NOTE — Telephone Encounter (Addendum)
Voicemail left at 1 and 62 Pt wife reported increased weight (5 lbs over night) Currently taking torsemide 40 mg daily Decreased urine out put Weight today  235 (weight at rehab discharge 230)  Not enrolled with [facility]- currently enrolled with PT/OT services only  Unable to use ted hose even with wife assistance  BLE edema into knee and under belly holding  Swelling Labs done 3/15 and results are available in office 3/16   Pt is NOT interested in going ER  Advised wife would speak provider regarding further medication adjustment (add compression wraps to [facility] for treatment-medication adjustment-ER)

## 2020-11-19 NOTE — Telephone Encounter (Signed)
Phone call sent directly into triage. Patient's wife (DPR) states patient is having BLE swelling, 7 bls wt gain and swelling has gotten worse and causing patient to have SOB as well. Patient's wife stated patient's BLE is so swollen that they are weeping. Patient's wife stated patient cannot even sleep laying down that he has to be sitting up. Patient also has reported poor urine output, patient is voiding, but not as much as usual.  Informed patient's wife if patient is that bad off that he probably needs to go to the ED and get evaluated sooner than later. Patient's wife stated patient does not want to go to ED and would prefer a direct admit or a medication that can clear this up, aka "magic pill". Patient's wife did call Heart Failure Clinic yesterday about issues, she said they were waiting on lab work before advising on any medication changes. Offered DOD appointment with Dr. Tamala Julian tomorrow, but patient already has OV with Dr. Lovena Le. Patient's wife stated she would like a message sent to Dr. Tamala Julian and his nurse, and would like to hear back from either one today. Advised patient's wife that she could try to go to [facility] ED in [facility] and it might not be as long of a wait, but they should not wait to be evaluated with his symptoms. Patient's wife stated she would wait to hear back from Dr. Tamala Julian or his nurse.

## 2020-11-20 ENCOUNTER — Encounter: Payer: Self-pay | Admitting: Internal Medicine

## 2020-11-20 ENCOUNTER — Ambulatory Visit (INDEPENDENT_AMBULATORY_CARE_PROVIDER_SITE_OTHER): Payer: Medicare Other | Admitting: Internal Medicine

## 2020-11-20 ENCOUNTER — Other Ambulatory Visit: Payer: Self-pay

## 2020-11-20 VITALS — BP 110/62 | HR 70 | Ht 67.0 in | Wt 239.0 lb

## 2020-11-20 DIAGNOSIS — Z950 Presence of cardiac pacemaker: Secondary | ICD-10-CM

## 2020-11-20 DIAGNOSIS — I495 Sick sinus syndrome: Secondary | ICD-10-CM

## 2020-11-20 DIAGNOSIS — I5042 Chronic combined systolic (congestive) and diastolic (congestive) heart failure: Secondary | ICD-10-CM | POA: Diagnosis not present

## 2020-11-20 LAB — CUP PACEART INCLINIC DEVICE CHECK
Battery Remaining Longevity: 151 mo
Battery Voltage: 3.07 V
Brady Statistic AP VP Percent: 22.37 %
Brady Statistic AP VS Percent: 75.3 %
Brady Statistic AS VP Percent: 0.55 %
Brady Statistic AS VS Percent: 1.78 %
Brady Statistic RA Percent Paced: 97.13 %
Brady Statistic RV Percent Paced: 22.92 %
Date Time Interrogation Session: 20220317140005
Implantable Lead Implant Date: 20210218
Implantable Lead Implant Date: 20210218
Implantable Lead Location: 753859
Implantable Lead Location: 753860
Implantable Lead Model: 3830
Implantable Lead Model: 5076
Implantable Pulse Generator Implant Date: 20210218
Lead Channel Impedance Value: 304 Ohm
Lead Channel Impedance Value: 342 Ohm
Lead Channel Impedance Value: 456 Ohm
Lead Channel Impedance Value: 475 Ohm
Lead Channel Pacing Threshold Amplitude: 0.75 V
Lead Channel Pacing Threshold Amplitude: 0.75 V
Lead Channel Pacing Threshold Pulse Width: 0.4 ms
Lead Channel Pacing Threshold Pulse Width: 0.4 ms
Lead Channel Sensing Intrinsic Amplitude: 0.8 mV
Lead Channel Sensing Intrinsic Amplitude: 8.1 mV
Lead Channel Setting Pacing Amplitude: 1.75 V
Lead Channel Setting Pacing Amplitude: 2.5 V
Lead Channel Setting Pacing Pulse Width: 0.4 ms
Lead Channel Setting Sensing Sensitivity: 1.2 mV

## 2020-11-20 NOTE — H&P (View-Only) (Signed)
HPI Mr. Todd Romero returns today after undergoing PPM insertion for CHB over a year ago. He also had CAD and OSA. He has PAF and has been cardioverted in the past. He has chronic renal insufficiency. He has a propensity for volume overload and takes as needed metolazone and daily torsemide. Since his PPM insertion, he notes he has done well with no chest pain but has had problems with sob. He is fairly sedentary due to arthritis and advanced age. He has had trouble with volume overload. He has pacing induced LBBB.  Allergies  Allergen Reactions  . Ambien [Zolpidem Tartrate] Other (See Comments)    Severe agitation   . Other Other (See Comments)  . Bactrim [Sulfamethoxazole-Trimethoprim] Nausea And Vomiting and Other (See Comments)    Bleeding and ulcers  . Morphine And Related Nausea And Vomiting     Current Outpatient Medications  Medication Sig Dispense Refill  . ACCU-CHEK AVIVA PLUS test strip USE AS INSTRUCTED TO CHECK BLOOD SUGAR TWICE A DAY DX:E11.65 100 strip 2  . acetaminophen (TYLENOL) 500 MG tablet Take 500 mg by mouth every 4 (four) hours as needed for moderate pain or headache.    [name] amiodarone (PACERONE) 200 MG tablet Take 1 tablet (200 mg total) by mouth daily. 30 tablet 5  . apixaban (ELIQUIS) 2.5 MG TABS tablet Take 1 tablet (2.5 mg total) by mouth 2 (two) times daily. 180 tablet 1  . B-D UF III MINI PEN NEEDLES 31G X 5 MM MISC USE 2 PEN NEEDLE PER DAY WITH HUMALOG AND VICTOZA 200 each 3  . ferrous sulfate 325 (65 FE) MG tablet Take 325 mg by mouth 2 (two) times daily with a meal.     . glimepiride (AMARYL) 2 MG tablet TAKE 1 TABLET DAILY BEFORE BREAKFAST 90 tablet 3  . HUMALOG KWIKPEN 100 UNIT/ML KwikPen INJECT 12 UNITS INTO THE SKIN 2 (TWO) TIMES DAILY BEFORE LUNCH AND SUPPER. INJECT 12 UNITS UNDER SKIN BEFORE LUNCH AND DINNER. MAY ALSO INJECT 4 UNIT AT BREAKFAST IS SUGAR READING IS OVER 100. 15 mL 1  . insulin NPH Human (HUMULIN N) 100 UNIT/ML injection Inject 26  units of Humulin N under the skin once daily at bedtime. 90 mL 1  . Insulin Syringe-Needle U-100 (BD INSULIN SYRINGE U/F) 31G X 5/16" 1 ML MISC Use to inject insulin daily. DX:E11.9 100 each 1  . liraglutide (VICTOZA) 18 MG/3ML SOPN Inject 1.8 mg daily 27 mL 3  . metoprolol succinate (TOPROL-XL) 25 MG 24 hr tablet Take by mouth.    . Multiple Vitamin (MULTIVITAMIN WITH MINERALS) TABS tablet Take 1 tablet by mouth daily. Mens One a day Vit    . Multiple Vitamins-Minerals (PRESERVISION AREDS 2 PO) Take 1 capsule by mouth 2 (two) times daily.    . pantoprazole (PROTONIX) 40 MG tablet Take 1 tablet (40 mg total) by mouth 2 (two) times daily. 180 tablet 3  . pravastatin (PRAVACHOL) 80 MG tablet Take 1 tablet (80 mg total) by mouth every evening. 30 tablet 0  . tamsulosin (FLOMAX) 0.4 MG CAPS capsule Take 1 capsule daily 90 capsule 1  . torsemide (DEMADEX) 20 MG tablet Take 2 tablets (40 mg total) by mouth daily. (Patient taking differently: Take 80 mg by mouth 2 (two) times daily.) 180 tablet 5   No current facility-administered medications for this visit.     Past Medical History:  Diagnosis Date  . AKI (acute kidney injury) ([place]) 10/24/2019  . Anemia   .  Arthritis   . CAD (coronary artery disease)    a. cardiac cath 12/2016 showing moderate AS, elevated LVEDP, heavy 3V coronary calcification, 100% mD2, 30-50% LAD, 50-90% stenosis of Cx proximal to origin of L-PDA, RCA not engaged due to poor catheter control (difficult procedure) - coronary status was essentially unchanged from prior.  . Chicken pox   . Chronic diastolic CHF (congestive heart failure) ([place])   . Colon polyps   . Degenerative arthritis   . Diabetes ([place])   . Dyspnea   . Dyspnea on exertion 02/11/2015  . Essential hypertension   . Heart disease   . Heme positive stool 11/16/2017  . Hyperlipidemia   . Intermediate stage nonexudative age-related macular degeneration of both eyes 02/27/2015  . Leukocytosis 11/16/2017  . New onset  a-fib ([place]) 03/04/2018  . Obesity (BMI 30-39.9) 06/18/2015  . OSA (obstructive sleep apnea)    Severe with AHI 27/hr now on CPAP  not compliant with treatment.  . Peritonitis ([place]) 1985?  [name] Pneumonia    "6 months - 87 years old"  . Posterior vitreous detachment, left 03/02/2016  . Pseudophakia of both eyes 03/02/2016  . Pure hypercholesterolemia   . RBBB   . [facility] spotted fever   . 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 Severe aortic stenosis    a. s/p TAVR 03/2017.  [name] Symptomatic bradycardia 10/24/2019  . Type II or unspecified type diabetes mellitus without mention of complication, not stated as uncontrolled   . Varicose vein of leg     ROS:   All systems reviewed and negative except as noted in the HPI.   Past Surgical History:  Procedure Laterality Date  . APPENDECTOMY  1985   peritonitis  . CARDIOVERSION N/A 04/03/2018   Procedure: CARDIOVERSION;  Surgeon: Josue Hector, MD;  Location: [facility] ENDOSCOPY;  Service: Cardiovascular;  Laterality: N/A;  . CARDIOVERSION N/A 01/18/2019   Procedure: CARDIOVERSION;  Surgeon: Skeet Latch, MD;  Location: [place];  Service: Cardiovascular;  Laterality: N/A;  . CARDIOVERSION N/A 10/07/2020   Procedure: CARDIOVERSION;  Surgeon: Larey Dresser, MD;  Location: [facility] ENDOSCOPY;  Service: Cardiovascular;  Laterality: N/A;  . CARPAL TUNNEL RELEASE    . COLONOSCOPY WITH PROPOFOL N/A 11/18/2017   Procedure: COLONOSCOPY WITH PROPOFOL;  Surgeon: Carol Ada, MD;  Location: [facility] ENDOSCOPY;  Service: Endoscopy;  Laterality: N/A;  . ESOPHAGOGASTRODUODENOSCOPY N/A 11/18/2017   Procedure: ESOPHAGOGASTRODUODENOSCOPY (EGD);  Surgeon: Carol Ada, MD;  Location: [facility] ENDOSCOPY;  Service: Endoscopy;  Laterality: N/A;  . EYE SURGERY Bilateral    cataracts removed, lens placed  . JOINT REPLACEMENT    . KNEE CARTILAGE  SURGERY    . PACEMAKER IMPLANT N/A 10/25/2019   Procedure: PACEMAKER IMPLANT;  Surgeon: Evans Lance, MD;  Location: [place] CV LAB;  Service: Cardiovascular;  Laterality: N/A;  . PARTIAL COLECTOMY    . RIGHT/LEFT HEART CATH AND CORONARY ANGIOGRAPHY N/A 12/28/2016   Procedure: Right/Left Heart Cath and Coronary Angiography;  Surgeon: Belva Crome, MD;  Location: [place] CV LAB;  Service: Cardiovascular;  Laterality: N/A;  . TEE WITHOUT CARDIOVERSION N/A 03/15/2017   Procedure: TRANSESOPHAGEAL ECHOCARDIOGRAM (TEE);  Surgeon: Burnell Blanks, MD;  Location: [place];  Service: Open Heart Surgery;  Laterality: N/A;  . TEE WITHOUT CARDIOVERSION N/A 10/07/2020   Procedure: TRANSESOPHAGEAL ECHOCARDIOGRAM (TEE);  Surgeon: Larey Dresser, MD;  Location: [facility]  ENDOSCOPY;  Service: Cardiovascular;  Laterality: N/A;  . TONSILLECTOMY AND ADENOIDECTOMY  1942  . TOTAL KNEE ARTHROPLASTY Right 11/08/2017  . TOTAL KNEE ARTHROPLASTY Right 11/08/2017   Procedure: TOTAL KNEE ARTHROPLASTY;  Surgeon: Melrose Nakayama, MD;  Location: [place];  Service: Orthopedics;  Laterality: Right;  . TRANSCATHETER AORTIC VALVE REPLACEMENT, TRANSFEMORAL N/A 03/15/2017   Procedure: TRANSCATHETER AORTIC VALVE REPLACEMENT, TRANSFEMORAL;  Surgeon: Burnell Blanks, MD;  Location: [place];  Service: Open Heart Surgery;  Laterality: N/A;  . VEIN SURGERY  1980     Family History  Problem Relation Age of Onset  . Heart failure Father   . Emphysema Father   . COPD Father   . Early death Father   . Heart attack Father   . Hypertension Mother   . Early death Mother   . Hyperlipidemia Mother   . Stroke Mother   . Cancer Sister   . Hypertension Sister   . Healthy Sister      Social History   Socioeconomic History  . Marital status: Married    Spouse name: Not on file  . Number of children: 4  . Years of education: Not on file  . Highest education level: Not on file  Occupational History  . Occupation:  Construction-Retired  Tobacco Use  . Smoking status: Never Smoker  . Smokeless tobacco: Never Used  Vaping Use  . Vaping Use: Never used  Substance and Sexual Activity  . Alcohol use: No  . Drug use: No  . Sexual activity: Not Currently    Partners: Female  Other Topics Concern  . 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, retired Mining engineer:      -smoke alarm in the home:Yes     - wears seatbelt: Yes     - Feels safe in their relationships: Yes   Social Determinants of Radio broadcast assistant Strain: Not on file  Food Insecurity: Not on file  Transportation Needs: Not on file  Physical Activity: Not on file  Stress: Not on file  Social Connections: Not on file  Intimate Partner Violence: Not on file     BP 110/62   Pulse 70   Ht 5\' 7"  (1.702 m)   Wt 239 lb (108.4 kg)   SpO2 99%   BMI 37.43 kg/m   Physical Exam:  Chronically ill appearing NAD HEENT: Unremarkable Neck:  7 cm JVD, no thyromegally Lymphatics:  No adenopathy Back:  No CVA tenderness Lungs:  Clear with rales in the bases HEART:  Regular rate rhythm, no murmurs, no rubs, no clicks Abd:  soft, positive bowel sounds, no organomegally, no rebound, no guarding Ext:  2 plus pulses, 2+ edema, no cyanosis, no clubbing Skin:  No rashes no nodules Neuro:  CN II through XII intact, motor grossly intact  EKG - NSR with long first degree AVB, PR almost 400 ms, with RBBB  DEVICE  Normal device function.  See PaceArt for details.   Assess/Plan: 1. Chronic systolic heart failure - repeat echo shows a decline in his LV function to 35% though that was over a year ago. I suspect that it is worse. He has severe conduction system disease and when he paces has a QRS of 177ms.  I have discussed the issues and recommend proceeding to biv PM upgrade. 2. Obesity - he will need to lose weight. 3. AS - he is s/p TAVR.  Carleene Overlie  Jonaven Hilgers,MD

## 2020-11-20 NOTE — Progress Notes (Signed)
HPI Todd Romero returns today after undergoing PPM insertion for CHB over a year ago. He also had CAD and OSA. He has PAF and has been cardioverted in the past. He has chronic renal insufficiency. He has a propensity for volume overload and takes as needed metolazone and daily torsemide. Since his PPM insertion, he notes he has done well with no chest pain but has had problems with sob. He is fairly sedentary due to arthritis and advanced age. He has had trouble with volume overload. He has pacing induced LBBB.  Allergies  Allergen Reactions  . Ambien [Zolpidem Tartrate] Other (See Comments)    Severe agitation   . Other Other (See Comments)  . Bactrim [Sulfamethoxazole-Trimethoprim] Nausea And Vomiting and Other (See Comments)    Bleeding and ulcers  . Morphine And Related Nausea And Vomiting     Current Outpatient Medications  Medication Sig Dispense Refill  . ACCU-CHEK AVIVA PLUS test strip USE AS INSTRUCTED TO CHECK BLOOD SUGAR TWICE A DAY DX:E11.65 100 strip 2  . acetaminophen (TYLENOL) 500 MG tablet Take 500 mg by mouth every 4 (four) hours as needed for moderate pain or headache.    [name] amiodarone (PACERONE) 200 MG tablet Take 1 tablet (200 mg total) by mouth daily. 30 tablet 5  . apixaban (ELIQUIS) 2.5 MG TABS tablet Take 1 tablet (2.5 mg total) by mouth 2 (two) times daily. 180 tablet 1  . B-D UF III MINI PEN NEEDLES 31G X 5 MM MISC USE 2 PEN NEEDLE PER DAY WITH HUMALOG AND VICTOZA 200 each 3  . ferrous sulfate 325 (65 FE) MG tablet Take 325 mg by mouth 2 (two) times daily with a meal.     . glimepiride (AMARYL) 2 MG tablet TAKE 1 TABLET DAILY BEFORE BREAKFAST 90 tablet 3  . HUMALOG KWIKPEN 100 UNIT/ML KwikPen INJECT 12 UNITS INTO THE SKIN 2 (TWO) TIMES DAILY BEFORE LUNCH AND SUPPER. INJECT 12 UNITS UNDER SKIN BEFORE LUNCH AND DINNER. MAY ALSO INJECT 4 UNIT AT BREAKFAST IS SUGAR READING IS OVER 100. 15 mL 1  . insulin NPH Human (HUMULIN N) 100 UNIT/ML injection Inject 26  units of Humulin N under the skin once daily at bedtime. 90 mL 1  . Insulin Syringe-Needle U-100 (BD INSULIN SYRINGE U/F) 31G X 5/16" 1 ML MISC Use to inject insulin daily. DX:E11.9 100 each 1  . liraglutide (VICTOZA) 18 MG/3ML SOPN Inject 1.8 mg daily 27 mL 3  . metoprolol succinate (TOPROL-XL) 25 MG 24 hr tablet Take by mouth.    . Multiple Vitamin (MULTIVITAMIN WITH MINERALS) TABS tablet Take 1 tablet by mouth daily. Mens One a day Vit    . Multiple Vitamins-Minerals (PRESERVISION AREDS 2 PO) Take 1 capsule by mouth 2 (two) times daily.    . pantoprazole (PROTONIX) 40 MG tablet Take 1 tablet (40 mg total) by mouth 2 (two) times daily. 180 tablet 3  . pravastatin (PRAVACHOL) 80 MG tablet Take 1 tablet (80 mg total) by mouth every evening. 30 tablet 0  . tamsulosin (FLOMAX) 0.4 MG CAPS capsule Take 1 capsule daily 90 capsule 1  . torsemide (DEMADEX) 20 MG tablet Take 2 tablets (40 mg total) by mouth daily. (Patient taking differently: Take 80 mg by mouth 2 (two) times daily.) 180 tablet 5   No current facility-administered medications for this visit.     Past Medical History:  Diagnosis Date  . AKI (acute kidney injury) ([place]) 10/24/2019  . Anemia   .  Arthritis   . CAD (coronary artery disease)    a. cardiac cath 12/2016 showing moderate AS, elevated LVEDP, heavy 3V coronary calcification, 100% mD2, 30-50% LAD, 50-90% stenosis of Cx proximal to origin of L-PDA, RCA not engaged due to poor catheter control (difficult procedure) - coronary status was essentially unchanged from prior.  . Chicken pox   . Chronic diastolic CHF (congestive heart failure) ([place])   . Colon polyps   . Degenerative arthritis   . Diabetes ([place])   . Dyspnea   . Dyspnea on exertion 02/11/2015  . Essential hypertension   . Heart disease   . Heme positive stool 11/16/2017  . Hyperlipidemia   . Intermediate stage nonexudative age-related macular degeneration of both eyes 02/27/2015  . Leukocytosis 11/16/2017  . New onset  a-fib ([place]) 03/04/2018  . Obesity (BMI 30-39.9) 06/18/2015  . OSA (obstructive sleep apnea)    Severe with AHI 27/hr now on CPAP  not compliant with treatment.  . Peritonitis ([place]) 1985?  [name] Pneumonia    "6 months - 87 years old"  . Posterior vitreous detachment, left 03/02/2016  . Pseudophakia of both eyes 03/02/2016  . Pure hypercholesterolemia   . RBBB   . [facility] spotted fever   . 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 Severe aortic stenosis    a. s/p TAVR 03/2017.  [name] Symptomatic bradycardia 10/24/2019  . Type II or unspecified type diabetes mellitus without mention of complication, not stated as uncontrolled   . Varicose vein of leg     ROS:   All systems reviewed and negative except as noted in the HPI.   Past Surgical History:  Procedure Laterality Date  . APPENDECTOMY  1985   peritonitis  . CARDIOVERSION N/A 04/03/2018   Procedure: CARDIOVERSION;  Surgeon: Josue Hector, MD;  Location: [facility] ENDOSCOPY;  Service: Cardiovascular;  Laterality: N/A;  . CARDIOVERSION N/A 01/18/2019   Procedure: CARDIOVERSION;  Surgeon: Skeet Latch, MD;  Location: [place];  Service: Cardiovascular;  Laterality: N/A;  . CARDIOVERSION N/A 10/07/2020   Procedure: CARDIOVERSION;  Surgeon: Larey Dresser, MD;  Location: [facility] ENDOSCOPY;  Service: Cardiovascular;  Laterality: N/A;  . CARPAL TUNNEL RELEASE    . COLONOSCOPY WITH PROPOFOL N/A 11/18/2017   Procedure: COLONOSCOPY WITH PROPOFOL;  Surgeon: Carol Ada, MD;  Location: [facility] ENDOSCOPY;  Service: Endoscopy;  Laterality: N/A;  . ESOPHAGOGASTRODUODENOSCOPY N/A 11/18/2017   Procedure: ESOPHAGOGASTRODUODENOSCOPY (EGD);  Surgeon: Carol Ada, MD;  Location: [facility] ENDOSCOPY;  Service: Endoscopy;  Laterality: N/A;  . EYE SURGERY Bilateral    cataracts removed, lens placed  . JOINT REPLACEMENT    . KNEE CARTILAGE  SURGERY    . PACEMAKER IMPLANT N/A 10/25/2019   Procedure: PACEMAKER IMPLANT;  Surgeon: Evans Lance, MD;  Location: [place] CV LAB;  Service: Cardiovascular;  Laterality: N/A;  . PARTIAL COLECTOMY    . RIGHT/LEFT HEART CATH AND CORONARY ANGIOGRAPHY N/A 12/28/2016   Procedure: Right/Left Heart Cath and Coronary Angiography;  Surgeon: Belva Crome, MD;  Location: [place] CV LAB;  Service: Cardiovascular;  Laterality: N/A;  . TEE WITHOUT CARDIOVERSION N/A 03/15/2017   Procedure: TRANSESOPHAGEAL ECHOCARDIOGRAM (TEE);  Surgeon: Burnell Blanks, MD;  Location: [place];  Service: Open Heart Surgery;  Laterality: N/A;  . TEE WITHOUT CARDIOVERSION N/A 10/07/2020   Procedure: TRANSESOPHAGEAL ECHOCARDIOGRAM (TEE);  Surgeon: Larey Dresser, MD;  Location: [facility]  ENDOSCOPY;  Service: Cardiovascular;  Laterality: N/A;  . TONSILLECTOMY AND ADENOIDECTOMY  1942  . TOTAL KNEE ARTHROPLASTY Right 11/08/2017  . TOTAL KNEE ARTHROPLASTY Right 11/08/2017   Procedure: TOTAL KNEE ARTHROPLASTY;  Surgeon: Melrose Nakayama, MD;  Location: [place];  Service: Orthopedics;  Laterality: Right;  . TRANSCATHETER AORTIC VALVE REPLACEMENT, TRANSFEMORAL N/A 03/15/2017   Procedure: TRANSCATHETER AORTIC VALVE REPLACEMENT, TRANSFEMORAL;  Surgeon: Burnell Blanks, MD;  Location: [place];  Service: Open Heart Surgery;  Laterality: N/A;  . VEIN SURGERY  1980     Family History  Problem Relation Age of Onset  . Heart failure Father   . Emphysema Father   . COPD Father   . Early death Father   . Heart attack Father   . Hypertension Mother   . Early death Mother   . Hyperlipidemia Mother   . Stroke Mother   . Cancer Sister   . Hypertension Sister   . Healthy Sister      Social History   Socioeconomic History  . Marital status: Married    Spouse name: Not on file  . Number of children: 4  . Years of education: Not on file  . Highest education level: Not on file  Occupational History  . Occupation:  Construction-Retired  Tobacco Use  . Smoking status: Never Smoker  . Smokeless tobacco: Never Used  Vaping Use  . Vaping Use: Never used  Substance and Sexual Activity  . Alcohol use: No  . Drug use: No  . Sexual activity: Not Currently    Partners: Female  Other Topics Concern  . 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, retired Mining engineer:      -smoke alarm in the home:Yes     - wears seatbelt: Yes     - Feels safe in their relationships: Yes   Social Determinants of Radio broadcast assistant Strain: Not on file  Food Insecurity: Not on file  Transportation Needs: Not on file  Physical Activity: Not on file  Stress: Not on file  Social Connections: Not on file  Intimate Partner Violence: Not on file     BP 110/62   Pulse 70   Ht 5\' 7"  (1.702 m)   Wt 239 lb (108.4 kg)   SpO2 99%   BMI 37.43 kg/m   Physical Exam:  Chronically ill appearing NAD HEENT: Unremarkable Neck:  7 cm JVD, no thyromegally Lymphatics:  No adenopathy Back:  No CVA tenderness Lungs:  Clear with rales in the bases HEART:  Regular rate rhythm, no murmurs, no rubs, no clicks Abd:  soft, positive bowel sounds, no organomegally, no rebound, no guarding Ext:  2 plus pulses, 2+ edema, no cyanosis, no clubbing Skin:  No rashes no nodules Neuro:  CN II through XII intact, motor grossly intact  EKG - NSR with long first degree AVB, PR almost 400 ms, with RBBB  DEVICE  Normal device function.  See PaceArt for details.   Assess/Plan: 1. Chronic systolic heart failure - repeat echo shows a decline in his LV function to 35% though that was over a year ago. I suspect that it is worse. He has severe conduction system disease and when he paces has a QRS of 157ms.  I have discussed the issues and recommend proceeding to biv PM upgrade. 2. Obesity - he will need to lose weight. 3. AS - he is s/p TAVR.  Todd Romero  Todd Guevara,MD

## 2020-11-20 NOTE — Patient Instructions (Addendum)
Medication Instructions:  Your physician recommends that you continue on your current medications as directed. Please refer to the Current Medication list given to you today.  Labwork: None ordered.  Testing/Procedures: None ordered.  Follow-Up: SEE INSTRUCTION LETTER--Will take 1/2 amount of normal insulin night before procedure due to low blood sugars in the morning (reported when given instructions-written on instruction letter)  Any Other Special Instructions Will Be Listed Below (If Applicable).  If you need a refill on your cardiac medications before your next appointment, please call your pharmacy.    Biventricular Pacemaker Implantation A biventricular pacemaker implantation is a procedure to place (implant) a pacemaker near the heart. A pacemaker is a small, battery-powered device that helps control the heartbeat. If the heart beats irregularly or too slowly (bradycardia), the pacemaker will pace the heart so that it beats at a normal rate or a programmed rate. The parts of a biventricular pacemaker include:  The pulse generator. The pulse generator contains a small computer and a memory system that is programmed to keep the heart beating at a certain rate. The pulse generator also produces the electrical signal that triggers the heart to beat. This is implanted under the skin of the upper chest, near the collarbone.  Wires (leads). There may be two or three leads placed in the heart--one to the right atrium, one to the right ventricle, and one through the coronary sinus to reach the left ventricle of the heart. The leads are connected to the pulse generator. They transmit electrical pulses from the pulse generator to the heart. A biventricular pacemaker is used in people with heart failure due to weak heart muscles. The pacemaker can help the heart chambers pump more efficiently. This procedure may be done to treat:  Symptoms of severe heart failure, such as shortness of breath  (dyspnea).  Loss of consciousness that happens repeatedly (syncope) because of an irregular heart rate. Tell a health care provider about:  Any allergies you have.  All medicines you are taking, including vitamins, herbs, eye drops, creams, and over-the-counter medicines.  Any problems you or family members have had with anesthetic medicines.  Any blood disorders you have.  Any surgeries you have had.  Any medical conditions you have.  Whether you are pregnant or may be pregnant. What are the risks? Generally, this is a safe procedure. However, problems may occur, including:  Infection.  Swelling, bruising, or bleeding at the pacemaker site, especially if you take blood thinners.  Allergic reactions to medicines or dyes.  Damage to blood vessels or nerves near the pacemaker.  Failure of the pacemaker to improve your condition.  Collapsed lung.  Blood clots.  Lead failures. This may require more surgery. What happens before the procedure? Medicines Ask your health care provider about:  Changing or stopping your regular medicines. This is especially important if you are taking diabetes medicines or blood thinners.  Taking medicines such as aspirin and ibuprofen. These medicines can thin your blood. Do not take these medicines unless your health care provider tells you to take them.  Taking over-the-counter medicines, vitamins, herbs, and supplements. Eating and drinking Follow instructions from your health care provider about eating and drinking, which may include:  8 hours before the procedure - stop eating heavy meals or foods, such as meat, fried foods, or fatty foods.  6 hours before the procedure - stop eating light meals or foods, such as toast or cereal.  6 hours before the procedure - stop drinking milk  or drinks that contain milk.  2 hours before the procedure - stop drinking clear liquids. Staying hydrated Follow instructions from your health care  provider about hydration, which may include:  Up to 2 hours before the procedure - you may continue to drink clear liquids, such as water, clear fruit juice, black coffee, and plain tea.   General instructions  Do not use any products that contain nicotine or tobacco for at least 4 weeks before the procedure. These products include cigarettes, e-cigarettes, and chewing tobacco. If you need help quitting, ask your health care provider.  You may have tests, including: ? Blood tests. ? Chest X-rays. ? Echocardiogram. This is a test that uses sound waves (ultrasound) to produce an image of the heart.  Ask your health care provider: ? How your surgery site will be marked. ? What steps will be taken to help prevent infection. These may include:  Removing hair at the surgery site.  Washing skin with a germ-killing soap.  Taking antibiotic medicine.  Plan to have someone take you home from the hospital or clinic.  If you will be going home right after the procedure, plan to have someone with you for 24 hours. What happens during the procedure?  An IV will be inserted into one of your veins.  You will be connected to a heart monitor. Large electrode pads will be placed on the front and back of your chest.  You will be given one or more of the following: ? A medicine to help you relax (sedative). ? A medicine to make you fall asleep (general anesthetic). ? A medicine that is injected into an area of your body to numb the area (local anesthetic).  An incision will be made in your upper chest, near your heart.  The leads will be guided into your incision, through your blood vessels, and into your heart. Your health care provider will use an X-ray machine (fluoroscope) to guide the leads into your heart.  The leads will be attached to your heart muscles and to the pulse generator.  The leads will be tested to make sure that they work correctly.  The pulse generator will be implanted  under your skin, near your incision.  The heart monitor will be watched to ensure that the pacer is working correctly.  Your incision will be closed with stitches (sutures), skin glue, or adhesive tape.  A bandage (dressing) will be placed over your incision. The procedure may vary among health care providers and hospitals.   What happens after the procedure?  Your blood pressure, heart rate, breathing rate, and blood oxygen level will be monitored until you leave the hospital or clinic.  You may continue to receive fluids and medicines through an IV.  You will be given pain medicine as needed.  You will have a chest X-ray done. This is to make sure that your pacemaker is in the right place.  You will be given a pacemaker identification card. This card lists the implant date, device model, and manufacturer of your pacemaker.  Do not drive for 24 hours if you were given a sedative during your procedure. Summary  A pacemaker is a small, battery-powered device that helps control the heartbeat.  A biventricular pacemaker is used in people with heart failure due to weak heart muscles.  Follow instructions from your health care provider about taking medicines and about eating and drinking before the procedure.  You will be given a pacemaker identification card that lists  the implant date, device model, and manufacturer of your pacemaker. This information is not intended to replace advice given to you by your health care provider. Make sure you discuss any questions you have with your health care provider. Document Revised: 07/26/2018 Document Reviewed: 07/26/2018 Elsevier Patient Education  2021 [employer].

## 2020-11-21 ENCOUNTER — Encounter (HOSPITAL_COMMUNITY): Payer: Self-pay

## 2020-11-21 ENCOUNTER — Ambulatory Visit (HOSPITAL_COMMUNITY)
Admission: RE | Admit: 2020-11-21 | Discharge: 2020-11-21 | Disposition: A | Discharge status: Home / Self Care | Payer: Medicare Other | Source: Ambulatory Visit | Attending: Cardiology | Admitting: Cardiology

## 2020-11-21 VITALS — Wt 238.8 lb

## 2020-11-21 DIAGNOSIS — Z8249 Family history of ischemic heart disease and other diseases of the circulatory system: Secondary | ICD-10-CM | POA: Diagnosis not present

## 2020-11-21 DIAGNOSIS — Z794 Long term (current) use of insulin: Secondary | ICD-10-CM | POA: Diagnosis not present

## 2020-11-21 DIAGNOSIS — Z950 Presence of cardiac pacemaker: Secondary | ICD-10-CM | POA: Diagnosis not present

## 2020-11-21 DIAGNOSIS — I5042 Chronic combined systolic (congestive) and diastolic (congestive) heart failure: Secondary | ICD-10-CM

## 2020-11-21 DIAGNOSIS — I350 Nonrheumatic aortic (valve) stenosis: Secondary | ICD-10-CM | POA: Insufficient documentation

## 2020-11-21 DIAGNOSIS — R001 Bradycardia, unspecified: Secondary | ICD-10-CM | POA: Insufficient documentation

## 2020-11-21 DIAGNOSIS — Z7901 Long term (current) use of anticoagulants: Secondary | ICD-10-CM | POA: Diagnosis not present

## 2020-11-21 DIAGNOSIS — Z7984 Long term (current) use of oral hypoglycemic drugs: Secondary | ICD-10-CM | POA: Diagnosis not present

## 2020-11-21 DIAGNOSIS — E1122 Type 2 diabetes mellitus with diabetic chronic kidney disease: Secondary | ICD-10-CM | POA: Insufficient documentation

## 2020-11-21 DIAGNOSIS — G4733 Obstructive sleep apnea (adult) (pediatric): Secondary | ICD-10-CM | POA: Diagnosis not present

## 2020-11-21 DIAGNOSIS — N184 Chronic kidney disease, stage 4 (severe): Secondary | ICD-10-CM | POA: Diagnosis not present

## 2020-11-21 DIAGNOSIS — I4819 Other persistent atrial fibrillation: Secondary | ICD-10-CM | POA: Diagnosis not present

## 2020-11-21 DIAGNOSIS — Z952 Presence of prosthetic heart valve: Secondary | ICD-10-CM | POA: Diagnosis not present

## 2020-11-21 DIAGNOSIS — Z86010 Personal history of colonic polyps: Secondary | ICD-10-CM | POA: Insufficient documentation

## 2020-11-21 DIAGNOSIS — I5043 Acute on chronic combined systolic (congestive) and diastolic (congestive) heart failure: Secondary | ICD-10-CM | POA: Insufficient documentation

## 2020-11-21 DIAGNOSIS — I130 Hypertensive heart and chronic kidney disease with heart failure and stage 1 through stage 4 chronic kidney disease, or unspecified chronic kidney disease: Secondary | ICD-10-CM | POA: Insufficient documentation

## 2020-11-21 DIAGNOSIS — I2510 Atherosclerotic heart disease of native coronary artery without angina pectoris: Secondary | ICD-10-CM | POA: Diagnosis not present

## 2020-11-21 DIAGNOSIS — Z79899 Other long term (current) drug therapy: Secondary | ICD-10-CM | POA: Diagnosis not present

## 2020-11-21 LAB — BASIC METABOLIC PANEL
Anion gap: 11 (ref 5–15)
BUN: 58 mg/dL — ABNORMAL HIGH (ref 8–23)
CO2: 25 mmol/L (ref 22–32)
Calcium: 8.5 mg/dL — ABNORMAL LOW (ref 8.9–10.3)
Chloride: 99 mmol/L (ref 98–111)
Creatinine, Ser: 2.02 mg/dL — ABNORMAL HIGH (ref 0.61–1.24)
GFR, Estimated: 32 mL/min — ABNORMAL LOW (ref >60)
Glucose, Bld: 128 mg/dL — ABNORMAL HIGH (ref 70–99)
Potassium: 3.4 mmol/L — ABNORMAL LOW (ref 3.5–5.1)
Sodium: 135 mmol/L (ref 135–145)

## 2020-11-21 LAB — CBC
HCT: 29.6 % — ABNORMAL LOW (ref 39.0–52.0)
Hemoglobin: 9.8 g/dL — ABNORMAL LOW (ref 13.0–17.0)
MCH: 29.8 pg (ref 26.0–34.0)
MCHC: 33.1 g/dL (ref 30.0–36.0)
MCV: 90 fL (ref 80.0–100.0)
Platelets: 166 10*3/uL (ref 150–400)
RBC: 3.29 MIL/uL — ABNORMAL LOW (ref 4.22–5.81)
RDW: 18.1 % — ABNORMAL HIGH (ref 11.5–15.5)
WBC: 6.8 10*3/uL (ref 4.0–10.5)
nRBC: 0 % (ref 0.0–0.2)

## 2020-11-21 NOTE — Patient Instructions (Signed)
Labs done today, your results will be available in MyChart, we will contact you for abnormal readings.  You have been referred to [facility] for IV Lasix, they will be in touch with you later today to schedule a visit  Your physician recommends that you schedule a follow-up appointment in: 1 week  If you have any questions or concerns before your next appointment please send [place] a message through [place] or call our office at [phone].    TO LEAVE A MESSAGE FOR THE NURSE SELECT OPTION 2, PLEASE LEAVE A MESSAGE INCLUDING: . YOUR NAME . DATE OF BIRTH . CALL BACK NUMBER . REASON FOR CALL**this is important as we prioritize the call backs  [place] AS LONG AS YOU CALL BEFORE 4:00 PM  At the [facility], you and your health needs are our priority. As part of our continuing mission to provide you with exceptional heart care, we have created designated Provider Care Teams. These Care Teams include your primary Cardiologist (physician) and Advanced Practice Providers (APPs- Physician Assistants and Nurse Practitioners) who all work together to provide you with the care you need, when you need it.   You may see any of the following providers on your designated Care Team at your next follow up: [name] Dr Glori Bickers . Dr [name] . Dr Vickki Muff . Darrick Grinder, NP . Lyda Jester, [place] . Audry Riles, PharmD   Please be sure to bring in all your medications bottles to every appointment.    Do the following things EVERYDAY: 1) Weigh yourself in the morning before breakfast. Write it down and keep it in a log. 2) Take your medicines as prescribed 3) Eat low salt foods--Limit salt (sodium) to 2000 mg per day.  4) Stay as active as you can everyday 5) Limit all fluids for the day to less than 2 liters

## 2020-11-21 NOTE — Progress Notes (Signed)
PCP: Dr Raoul Pitch EP: Dr Lovena Le Cardiology: Dr Tamala Julian  Primary HF Cardiologist: Dr Aundra Dubin   HPI: Mr Bubeck is a 87 year old with a history of chronic combined systolic and diastolic heart failure, last echo 1/21 showed reduced EF 35 to 40% with moderate LVH. RV normal. Also history of severe aortic stenosis status post TAVR in2018, symptomatic bradycardia/RBBBstatus post Medtronic permanent pacemaker implanted 10/2019, CAD, PAF, insulin-dependent diabetes, stage IVCKD, severe OSAintolerant toCPAP, hypertension, as well as history of carpal tunnel status post bilateralcarpal tunnel release.  Presented to the ED on 1/26. Markedly fluid overloaded.Started on IV lasix + empiric milrinone.   His device was interrogated and showed he had been in persistent atrial fibrillation since 09/06/20 w/ 34% RV pacing. Underwent TEE guided DCCV back to NSR. TEE (2/1) with EF 25-30%, mildly decreased RV systolic function, normal function of TAVR valve. Hospital course complicated by AKI/urosepsis. Work up for amyloid was negative. Myeloma panel,urine IFXN, and PYP were negative. Will need followup with EP =>discussed with Dr. Quentin Ore, suspect he will need CRT with high percentage (>30%) RV pacing and a-pacing with very long PR interval. Discharged to SNF, [place].   He was sent to [facility] 10/19/20 for anxiety/tremors. CT of head was negative for hemorrhage. He was sent back to SNF.  Creatinine 1.8  And K 5.1.   Seen in [facility] 2/22 and volume status felt to be low. Torsemide was reduced to 40 mg once daily and spiro stopped. Now w/ fluid overload. Wife called clinic 3/16 reporting increased wt gain, LEE and exertional dyspnea. Was advised to increase torsemide back to 40 mg bid.   He returns to clinic today for f/u. Markedly volume overloaded w/ 2+ bilateral LEE up to thighs, Wt is up ~25 lb per pt report. NYHA Class III. He saw Dr. Lovena Le in EP clinic yesterday to discuss BiV upgrade, which is  scheduled for 3/28. Device was interrogated in EP clinic yesterday, there was no afib detection, though EKG today shows ? Afib. HR 66 bmp. He denies palpitations.   Cardiac Studies Echo 10/03/20 EF 30-35% RV low normal , LA/RA severely dilated. AVR. (TAVR)  Echo 09/13/2019 EF 35-40%  Grade II DD. RV normal  Echo 2019 EF 43$   PYP 10/2020 not suggestive TTR.   Labs  10/21/20 K 5.1 Creatinine 1.8  10/14/20 K 3.6 Creatinine 2.25   ROS: All systems negative except as listed in HPI, PMH and Problem List.  SH:  Social History   Socioeconomic History  . Marital status: Married    Spouse name: Not on file  . Number of children: 4  . Years of education: Not on file  . Highest education level: Not on file  Occupational History  . Occupation: Construction-Retired  Tobacco Use  . Smoking status: Never Smoker  . Smokeless tobacco: Never Used  Vaping Use  . Vaping Use: Never used  Substance and Sexual Activity  . Alcohol use: No  . Drug use: No  . Sexual activity: Not Currently    Partners: Female  Other Topics Concern  . 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 Heart failure Father   . Emphysema Father   . COPD Father   . Early death Father   . Heart attack Father   . Hypertension Mother   . Early death Mother   . Hyperlipidemia Mother   . Stroke Mother   . Cancer Sister   . Hypertension Sister   . Healthy Sister     Past Medical History:   Diagnosis Date  . AKI (acute kidney injury) ([place]) 10/24/2019  . Anemia   . Arthritis   . CAD (coronary artery disease)    a. cardiac cath 12/2016 showing moderate AS, elevated LVEDP, heavy 3V coronary calcification, 100% mD2, 30-50% LAD, 50-90% stenosis of Cx proximal to origin of L-PDA, RCA not engaged due to poor catheter control (difficult procedure) - coronary status was essentially unchanged from prior.  . Chicken pox   . Chronic diastolic CHF (congestive heart failure) ([place])   . Colon polyps   . Degenerative arthritis   . Diabetes ([place])   . Dyspnea   . Dyspnea on exertion 02/11/2015  . Essential hypertension   . Heart disease   . Heme positive stool 11/16/2017  . Hyperlipidemia   . Intermediate stage nonexudative age-related macular degeneration of both eyes 02/27/2015  . Leukocytosis 11/16/2017  . New onset a-fib ([place]) 03/04/2018  . Obesity (BMI 30-39.9) 06/18/2015  . OSA (obstructive sleep apnea)    Severe with AHI 27/hr now on CPAP  not compliant with treatment.  . Peritonitis ([place]) 1985?  [name] Pneumonia    "6 months - 87 years old"  . Posterior vitreous detachment, left 03/02/2016  . Pseudophakia of both eyes 03/02/2016  . Pure hypercholesterolemia   . RBBB   . [facility] spotted fever   . 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 Severe aortic stenosis    a. s/p TAVR 03/2017.  [name] Symptomatic bradycardia 10/24/2019  . Type II or unspecified type diabetes mellitus without mention of complication, not stated as uncontrolled   . Varicose vein of leg     Current Outpatient Medications  Medication Sig Dispense Refill  . ACCU-CHEK AVIVA PLUS test strip USE AS INSTRUCTED TO CHECK BLOOD SUGAR TWICE A DAY DX:E11.65 100 strip 2  . acetaminophen (TYLENOL) 500 MG tablet Take 500 mg by mouth every 4 (four) hours as needed for moderate pain or headache.    [name]  amiodarone (PACERONE) 200 MG tablet Take 1 tablet (200 mg total) by mouth daily. 30 tablet 5  . apixaban (ELIQUIS) 2.5 MG TABS tablet Take 1 tablet (2.5 mg total) by mouth 2 (two) times daily. 180 tablet 1  . B-D UF III MINI PEN NEEDLES 31G X 5 MM MISC USE 2 PEN NEEDLE PER DAY WITH HUMALOG AND VICTOZA 200 each 3  . ferrous sulfate 325 (65 FE) MG tablet Take 325 mg by mouth 2 (two) times daily with a meal.     . glimepiride (AMARYL) 2 MG tablet TAKE 1 TABLET DAILY BEFORE BREAKFAST 90 tablet 3  . HUMALOG KWIKPEN 100 UNIT/ML KwikPen INJECT 12 UNITS INTO THE SKIN 2 (TWO) TIMES DAILY BEFORE LUNCH AND SUPPER.  INJECT 12 UNITS UNDER SKIN BEFORE LUNCH AND DINNER. MAY ALSO INJECT 4 UNIT AT BREAKFAST IS SUGAR READING IS OVER 100. 15 mL 1  . insulin NPH Human (HUMULIN N) 100 UNIT/ML injection Inject 26 units of Humulin N under the skin once daily at bedtime. (Patient taking differently: Inject 26 units of Humulin N under the skin once daily at bedtime.) 90 mL 1  . Insulin Syringe-Needle U-100 (BD INSULIN SYRINGE U/F) 31G X 5/16" 1 ML MISC Use to inject insulin daily. DX:E11.9 100 each 1  . liraglutide (VICTOZA) 18 MG/3ML SOPN Inject 1.8 mg daily 27 mL 3  . metoprolol succinate (TOPROL-XL) 25 MG 24 hr tablet Take 25 mg by mouth daily.    . Multiple Vitamin (MULTIVITAMIN WITH MINERALS) TABS tablet Take 1 tablet by mouth daily. Mens One a day Vit    . Multiple Vitamins-Minerals (PRESERVISION AREDS 2 PO) Take 1 capsule by mouth 2 (two) times daily.    . pantoprazole (PROTONIX) 40 MG tablet Take 1 tablet (40 mg total) by mouth 2 (two) times daily. 180 tablet 3  . pravastatin (PRAVACHOL) 80 MG tablet Take 1 tablet (80 mg total) by mouth every evening. 30 tablet 0  . tamsulosin (FLOMAX) 0.4 MG CAPS capsule Take 1 capsule daily 90 capsule 1  . Torsemide 40 MG TABS Take 2 tablets by mouth in the morning and at bedtime.     No current facility-administered medications for this encounter.    Vitals:   11/21/20 0944   Weight: 108.3 kg (238 lb 12.8 oz)   Wt Readings from Last 3 Encounters:  11/21/20 108.3 kg (238 lb 12.8 oz)  11/20/20 108.4 kg (239 lb)  11/06/20 100.2 kg (221 lb)     PHYSICAL EXAM: General:  Well appearing, obese elderly male. No respiratory difficulty HEENT: normal Neck: supple. JVD elevated to jaw. Carotids 2+ bilat; no bruits. No lymphadenopathy or thyromegaly appreciated. Cor: PMI nondisplaced. Regular rate & rhythm. No rubs, gallops or murmurs. Lungs: decreased BS at the bases bilaterally  Abdomen: soft, nontender, nondistended. No hepatosplenomegaly. No bruits or masses. Good bowel sounds. Extremities: no cyanosis, clubbing, rash, 2+ bilateral pitting edema up to thighs  Neuro: alert & oriented x 3, cranial nerves grossly intact. moves all 4 extremities w/o difficulty. Affect pleasant.    ECG: ? Afib 66 bpm    ASSESSMENT & PLAN: 1. Acute on Chronic Combined Systolic/Diastolic Heart Failure  - Etiology uncertain, last cath in 2018 pre-TAVR with occluded D2 and 90% stenosis left PDA. He had MDT PPM for symptomatic bradycardia in 2/21 (after last echo). Recent admit for a/c CHF 2/22 where he was found to be RV pacing 34% of the time on device interrogation and had been in persistent atrial fibrillation since the beginning of January. Echo  10/03/20 EF 30-35% RV mildly reduced TAVR ok.  TEE (2/1) with EF 25-30%, mildly decreased RV systolic function, normal function of TAVR valve. TEE-DCCV 2/1 with conversion to NSR but then back to atrial fibrillation>>converted back to NSR on amio gtt. PYP not suggestive of TTR.  - volume overloaded after recent diuretic dose reduction. ReDs Clip 46%. 2+ edema up to thighs, wt up 25 lb NYHA Class III - Refer to [facility] for IV Lasix 80 mg bid x 3 days  - Daily BMPs, supp K as needed - Place [employer]  - Not on arni, mra with CKDI Stage IV - He did not tolerarte farxiga - Plan device upgrade to CRT-P on 3/28  2. PAF - TEE-DCCV on 2/1  initially converted him to NSR but back to atrial fibrillation soon after.  Converted back to NSR on 2/2 with amiodarone - ? Afib on EKG today, though he just had device interrogated yesterday that showed no recurrence of Afib - resting HR in the 60s - given need to hold Eliquis in the next 30 days for device upgrade, will not titrate amio to prevent chemical conversion  - Continue amio 200 mg daily - Continue eliquis 2.5 mg twice a day  - No bleeding issues.   3. CAD - Cath in 2018 pre-TAVR with occluded D2 and 90% stenosis left PDA.  -On elqiuis + statin - denies CP   4. CKD Stager IV - Follow up by Nephrology  - Plan daily BMPs w/ IV Lasix   5. Bradycardia --> PPM Medtronic  - Plan upgrade to CRT-P 3/28 given high RV pacing % and worsening HF   F/u in APP clinic next week to reassess volume status     Roza Creamer PA-C  10:25 AM

## 2020-11-21 NOTE — Progress Notes (Signed)
Patient in for nurse visit to follow up on fluid status. Patient reports slight improvement of L/E edema.Patient reports sleeping in recliner.Reds Clip 46%. Discussed with Lyda Jester PA ,she will come to assess patient.

## 2020-11-21 NOTE — Progress Notes (Signed)
ReDS Vest / Clip - 11/21/20 0900      ReDS Vest / Clip   Station Marker D    Ruler Value 31.5    ReDS Value Range High volume overload    ReDS Actual Value 46

## 2020-11-25 ENCOUNTER — Encounter (HOSPITAL_COMMUNITY): Payer: Self-pay

## 2020-11-25 ENCOUNTER — Other Ambulatory Visit: Payer: Self-pay

## 2020-11-25 ENCOUNTER — Ambulatory Visit (HOSPITAL_COMMUNITY)
Admission: RE | Admit: 2020-11-25 | Discharge: 2020-11-25 | Disposition: A | Discharge status: Home / Self Care | Payer: Medicare Other | Source: Ambulatory Visit | Attending: Adult Health | Admitting: Adult Health

## 2020-11-25 VITALS — BP 140/82 | HR 68 | Wt 240.8 lb

## 2020-11-25 DIAGNOSIS — E1122 Type 2 diabetes mellitus with diabetic chronic kidney disease: Secondary | ICD-10-CM | POA: Diagnosis not present

## 2020-11-25 DIAGNOSIS — Z7901 Long term (current) use of anticoagulants: Secondary | ICD-10-CM | POA: Diagnosis not present

## 2020-11-25 DIAGNOSIS — I5042 Chronic combined systolic (congestive) and diastolic (congestive) heart failure: Secondary | ICD-10-CM | POA: Insufficient documentation

## 2020-11-25 DIAGNOSIS — Z794 Long term (current) use of insulin: Secondary | ICD-10-CM | POA: Insufficient documentation

## 2020-11-25 DIAGNOSIS — R001 Bradycardia, unspecified: Secondary | ICD-10-CM | POA: Diagnosis not present

## 2020-11-25 DIAGNOSIS — Z8249 Family history of ischemic heart disease and other diseases of the circulatory system: Secondary | ICD-10-CM | POA: Insufficient documentation

## 2020-11-25 DIAGNOSIS — I130 Hypertensive heart and chronic kidney disease with heart failure and stage 1 through stage 4 chronic kidney disease, or unspecified chronic kidney disease: Secondary | ICD-10-CM | POA: Insufficient documentation

## 2020-11-25 DIAGNOSIS — N184 Chronic kidney disease, stage 4 (severe): Secondary | ICD-10-CM | POA: Insufficient documentation

## 2020-11-25 DIAGNOSIS — I480 Paroxysmal atrial fibrillation: Secondary | ICD-10-CM | POA: Diagnosis not present

## 2020-11-25 DIAGNOSIS — Z950 Presence of cardiac pacemaker: Secondary | ICD-10-CM | POA: Diagnosis not present

## 2020-11-25 DIAGNOSIS — E785 Hyperlipidemia, unspecified: Secondary | ICD-10-CM | POA: Insufficient documentation

## 2020-11-25 DIAGNOSIS — Z79899 Other long term (current) drug therapy: Secondary | ICD-10-CM | POA: Insufficient documentation

## 2020-11-25 DIAGNOSIS — I5033 Acute on chronic diastolic (congestive) heart failure: Secondary | ICD-10-CM | POA: Diagnosis not present

## 2020-11-25 DIAGNOSIS — E7800 Pure hypercholesterolemia, unspecified: Secondary | ICD-10-CM | POA: Diagnosis not present

## 2020-11-25 DIAGNOSIS — I2510 Atherosclerotic heart disease of native coronary artery without angina pectoris: Secondary | ICD-10-CM | POA: Diagnosis not present

## 2020-11-25 MED ORDER — POTASSIUM CHLORIDE CRYS ER 20 MEQ PO TBCR: EXTENDED_RELEASE_TABLET | ORAL | 3 refills | Status: DC

## 2020-11-25 MED ORDER — METOLAZONE 5 MG PO TABS: 5.0000 mg | ORAL_TABLET | Freq: Every day | ORAL | 1 refills | Status: DC

## 2020-11-25 NOTE — Progress Notes (Signed)
PCP: Dr Raoul Pitch EP: Dr Lovena Le Cardiology: Dr Tamala Julian  Primary HF Cardiologist: Dr Aundra Dubin   HPI: [name] is a 87 year old with a history of chronic combined systolic and diastolic heart failure, last echo 1/21 showed reduced EF 35 to 40% with moderate LVH. RV normal. Also history of severe aortic stenosis status post TAVR in2018, symptomatic bradycardia/RBBBstatus post Medtronic permanent pacemaker implanted 10/2019, CAD, PAF, insulin-dependent diabetes, stage IVCKD, severe OSAintolerant toCPAP, hypertension, as well as history of carpal tunnel status post bilateralcarpal tunnel release.  Presented to the ED on 1/26. Markedly fluid overloaded.Started on IV lasix + empiric milrinone.   His device was interrogated and showed he had been in persistent atrial fibrillation since 09/06/20 w/ 34% RV pacing. Underwent TEE guided DCCV back to NSR. TEE (2/1) with EF 25-30%, mildly decreased RV systolic function, normal function of TAVR valve. Hospital course complicated by AKI/urosepsis. Work up for amyloid was negative. Myeloma panel,urine IFXN, and PYP were negative. Will need followup with EP =>discussed with Dr. Quentin Ore, suspect he will need CRT with high percentage (>30%) RV pacing and a-pacing with very long PR interval. Discharged to SNF, [facility]. Discharge weight was 223 pounds.   He was sent to [facility] 10/19/20 for anxiety/tremors. CT of head was negative for hemorrhage. He was sent back to SNF.  Creatinine 1.8  And K 5.1.   On 3/18 he was referred to [facility] for volume overload. Overall he received 6 doses of IV lasix over the 4 days. Weight at home 236-238 pounds.   On 3/21 creatinine 2.1.   Planning for device upgrade 12/01/20   Today he returns for HF follow up with his wife. Overall feeling fine but still has swelling in his legs. Having some shortness of breath with exertion but feels ok. Denies PND/Orthopnea. Moving around in the house. Appetite ok. Drinking a  little extra fluid during the day. No fever or chills. Weight at home 236-238  pounds. Taking all medications.   Cardiac Studies Echo 10/03/20 EF 30-35% RV low normal , LA/RA severely dilated. AVR. (TAVR)  Echo 09/13/2019 EF 35-40%  Grade II DD. RV normal  Echo 2019 EF 43%   PYP 10/2020 not suggestive TTR.   Labs  10/21/20 K 5.1 Creatinine 1.8  10/14/20 K 3.6 Creatinine 2.25  11/24/20: Creatinine 2.08  ROS: All systems negative except as listed in HPI, PMH and Problem List.  SH:  Social History   Socioeconomic History  . Marital status: Married    Spouse name: Not on file  . Number of children: 4  . Years of education: Not on file  . Highest education level: Not on file  Occupational History  . Occupation: Construction-Retired  Tobacco Use  . Smoking status: Never Smoker  . Smokeless tobacco: Never Used  Vaping Use  . Vaping Use: Never used  Substance and Sexual Activity  . Alcohol use: No  . Drug use: No  . Sexual activity: Not Currently    Partners: Female  Other Topics Concern  . 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 Heart failure Father   . Emphysema Father   . COPD Father   . Early death Father   . Heart attack Father   . Hypertension Mother   . Early death Mother   . Hyperlipidemia Mother   . Stroke Mother   . Cancer Sister   . Hypertension Sister   . Healthy Sister     Past Medical History:  Diagnosis Date  . AKI  (acute kidney injury) ([place]) 10/24/2019  . Anemia   . Arthritis   . CAD (coronary artery disease)    a. cardiac cath 12/2016 showing moderate AS, elevated LVEDP, heavy 3V coronary calcification, 100% mD2, 30-50% LAD, 50-90% stenosis of Cx proximal to origin of L-PDA, RCA not engaged due to poor catheter control (difficult procedure) - coronary status was essentially unchanged from prior.  . Chicken pox   . Chronic diastolic CHF (congestive heart failure) ([place])   . Colon polyps   . Degenerative arthritis   . Diabetes ([place])   . Dyspnea   . Dyspnea on exertion 02/11/2015  . Essential hypertension   . Heart disease   . Heme positive stool 11/16/2017  . Hyperlipidemia   . Intermediate stage nonexudative age-related macular degeneration of both eyes 02/27/2015  . Leukocytosis 11/16/2017  . New onset a-fib ([place]) 03/04/2018  . Obesity (BMI 30-39.9) 06/18/2015  . OSA (obstructive sleep apnea)    Severe with AHI 27/hr now on CPAP  not compliant with treatment.  . Peritonitis ([place]) 1985?  [name] Pneumonia    "6 months - 87 years old"  . Posterior vitreous detachment, left 03/02/2016  . Pseudophakia of both eyes 03/02/2016  . Pure hypercholesterolemia   . RBBB   . [facility] spotted fever   . 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 Severe aortic stenosis    a. s/p TAVR 03/2017.  [name] Symptomatic bradycardia 10/24/2019  . Type II or unspecified type diabetes mellitus without mention of complication, not stated as uncontrolled   . Varicose vein of leg     Current Outpatient Medications  Medication Sig Dispense Refill  . ACCU-CHEK AVIVA PLUS test strip USE AS INSTRUCTED TO CHECK BLOOD SUGAR TWICE A DAY DX:E11.65 100 strip 2  . acetaminophen (TYLENOL) 500 MG tablet Take 500 mg by mouth every 4 (four) hours as needed for moderate pain or headache.    [name] amiodarone (PACERONE) 200 MG  tablet Take 1 tablet (200 mg total) by mouth daily. 30 tablet 5  . apixaban (ELIQUIS) 2.5 MG TABS tablet Take 1 tablet (2.5 mg total) by mouth 2 (two) times daily. 180 tablet 1  . B-D UF III MINI PEN NEEDLES 31G X 5 MM MISC USE 2 PEN NEEDLE PER DAY WITH HUMALOG AND VICTOZA 200 each 3  . ferrous sulfate 325 (65 FE) MG tablet Take 325 mg by mouth 2 (two) times daily with a meal.     . glimepiride (AMARYL) 2 MG tablet TAKE 1 TABLET DAILY BEFORE BREAKFAST 90 tablet 3  . HUMALOG KWIKPEN 100 UNIT/ML KwikPen INJECT 12 UNITS INTO THE SKIN 2 (TWO) TIMES DAILY BEFORE LUNCH AND SUPPER. INJECT 12 UNITS UNDER SKIN BEFORE LUNCH AND DINNER.  MAY ALSO INJECT 4 UNIT AT BREAKFAST IS SUGAR READING IS OVER 100. 15 mL 1  . insulin NPH Human (HUMULIN N) 100 UNIT/ML injection Inject 26 units of Humulin N under the skin once daily at bedtime. (Patient taking differently: Inject 26 units of Humulin N under the skin once daily at bedtime.) 90 mL 1  . Insulin Syringe-Needle U-100 (BD INSULIN SYRINGE U/F) 31G X 5/16" 1 ML MISC Use to inject insulin daily. DX:E11.9 100 each 1  . liraglutide (VICTOZA) 18 MG/3ML SOPN Inject 1.8 mg daily 27 mL 3  . metoprolol succinate (TOPROL-XL) 25 MG 24 hr tablet Take 25 mg by mouth daily.    . Multiple Vitamin (MULTIVITAMIN WITH MINERALS) TABS tablet Take 1 tablet by mouth daily. Mens One a day Vit    . Multiple Vitamins-Minerals (PRESERVISION AREDS 2 PO) Take 1 capsule by mouth 2 (two) times daily.    . pantoprazole (PROTONIX) 40 MG tablet Take 1 tablet (40 mg total) by mouth 2 (two) times daily. 180 tablet 3  . pravastatin (PRAVACHOL) 80 MG tablet Take 1 tablet (80 mg total) by mouth every evening. 30 tablet 0  . tamsulosin (FLOMAX) 0.4 MG CAPS capsule Take 1 capsule daily 90 capsule 1  . Torsemide 40 MG TABS Take 2 tablets by mouth in the morning and at bedtime. 80 mg twice daily     No current facility-administered medications for this encounter.    Vitals:   11/25/20 1016  BP: 140/82   Pulse: 68  SpO2: 99%  Weight: 109.2 kg (240 lb 12.8 oz)   Wt Readings from Last 3 Encounters:  11/25/20 109.2 kg (240 lb 12.8 oz)  11/21/20 108.3 kg (238 lb 12.8 oz)  11/20/20 108.4 kg (239 lb)   Reds Clip 47%.   PHYSICAL EXAM: General:  Arrived in a wheel chair. Well appearing. No resp difficulty HEENT: normal Neck: supple. JVP to jaw.  Carotids 2+ bilat; no bruits. No lymphadenopathy or thryomegaly appreciated. Cor: PMI nondisplaced. Regular rate & rhythm. No rubs, gallops or murmurs. Lungs: clear Abdomen: soft, nontender, nondistended. No hepatosplenomegaly. No bruits or masses. Good bowel sounds. Extremities: no cyanosis, clubbing, rash, R and LLE unna boots. R and LLE 1-2+ edema above the unna boots.  Neuro: alert & orientedx3, cranial nerves grossly intact. moves all 4 extremities w/o difficulty. Affect pleasant    ASSESSMENT & PLAN: 1. Combined Systolic/Diastolic Heart Failure  Etiology uncertain, last cath in 2018 pre-TAVR with occluded D2 and 90% stenosis left PDA. He had MDT PPM for symptomatic bradycardia in 2/21 (after last echo). He is RV pacing 34% of the time on device interrogation this admission. Additionally, he has been in atrial fibrillation since the beginning of January. Echo  10/03/20 EF 30-35% RV mildly reduced TAVR ok.  TEE (2/1) with EF 25-30%, mildly decreased RV systolic function, normal function of TAVR valve. TEE-DCCV 2/1 with conversion to NSR but then back to atrial fibrillation>>converted back to NSR on amio gtt - PYP not suggestive of TTR. Discussed results today  -NYHA III. Volume status trending up despite IV lasix. He does not want to be hospitalized. Will try additonal IV lasix.  - I have personally called [facility]. - 3/22 80 mg IV lasix x1  -3/23 &3/24 5 mg metolazone once  + 80 mg IV twice daily + 20 meq potassium.  - Check BMET 3/22 & 3/23  -Not on arni, mra with CKDI Stage IV -He did not tolerarte farxiga - Plan for device upgrade  3/28 by Dr Lovena Le.  - Discussed low salt food choices and limiting fluid intake to < 2 liters per day.   2. PAF TEE-DCCV on 2/1 initially converted him to NSR but back to atrial fibrillation soon after.  Converted back to NSR on 2/2 with amiodarone - Regular on exam. No bleeding issues.  - Continue amio 200 mg daily - Continue eliquis 2.5 mg twice a day   3. CAD Cath in 2018 pre-TAVR with occluded D2 and 90% stenosis left PDA.  -On elqiuis + statin - No chest pain.   4. CKD Stager IV Follow up by Nephrology  - 11/24/20 Creatinine 2.08  5. Bradycardia --> PPM Medtronic  Follows with Dr Lovena Le. Plans for device upgrade 12/01/20  I reviewed notes from [facility].   Follow up in Friday to reassess volume status. If no improvement will likely need hospital admit. Today he declines admit and we will try additional IV diuresis. I personally called and discussed plan in detail with Todd Ito NP at [facility]. Todd Lacy Devani Odonnel NP-C  10:22 AM

## 2020-11-25 NOTE — Progress Notes (Signed)
ReDS Vest / Clip - 11/25/20 1000      ReDS Vest / Clip   Station Marker D    Ruler Value 32    ReDS Value Range High volume overload    ReDS Actual Value 47

## 2020-11-25 NOTE — Patient Instructions (Addendum)
[facility] to see you at home and give IV Lasix 3/22, 3/23 and 3/24.   In addition--Please take Metolazone 5 mg and Potassium 20 meq on 3/23 and 3/24 Follow-up on Friday in the AHF Clinic on Friday March 25th at 1000 AM.

## 2020-11-28 ENCOUNTER — Other Ambulatory Visit: Payer: Self-pay

## 2020-11-28 ENCOUNTER — Other Ambulatory Visit (HOSPITAL_COMMUNITY)
Admission: RE | Admit: 2020-11-28 | Discharge: 2020-11-28 | Disposition: A | Discharge status: Home / Self Care | Payer: Medicare Other | Source: Ambulatory Visit | Attending: Internal Medicine | Admitting: Internal Medicine

## 2020-11-28 ENCOUNTER — Ambulatory Visit (HOSPITAL_COMMUNITY)
Admission: RE | Admit: 2020-11-28 | Discharge: 2020-11-28 | Disposition: A | Discharge status: Home / Self Care | Payer: Medicare Other | Source: Ambulatory Visit | Attending: Adult Health | Admitting: Adult Health

## 2020-11-28 ENCOUNTER — Encounter (HOSPITAL_COMMUNITY): Payer: Self-pay

## 2020-11-28 VITALS — BP 170/100 | HR 70 | Wt 227.0 lb

## 2020-11-28 DIAGNOSIS — I2510 Atherosclerotic heart disease of native coronary artery without angina pectoris: Secondary | ICD-10-CM | POA: Diagnosis not present

## 2020-11-28 DIAGNOSIS — Z7901 Long term (current) use of anticoagulants: Secondary | ICD-10-CM

## 2020-11-28 DIAGNOSIS — E119 Type 2 diabetes mellitus without complications: Secondary | ICD-10-CM | POA: Diagnosis not present

## 2020-11-28 DIAGNOSIS — I5042 Chronic combined systolic (congestive) and diastolic (congestive) heart failure: Secondary | ICD-10-CM

## 2020-11-28 DIAGNOSIS — R001 Bradycardia, unspecified: Secondary | ICD-10-CM | POA: Diagnosis not present

## 2020-11-28 DIAGNOSIS — Z20822 Contact with and (suspected) exposure to covid-19: Secondary | ICD-10-CM | POA: Insufficient documentation

## 2020-11-28 DIAGNOSIS — N184 Chronic kidney disease, stage 4 (severe): Secondary | ICD-10-CM | POA: Insufficient documentation

## 2020-11-28 DIAGNOSIS — I480 Paroxysmal atrial fibrillation: Secondary | ICD-10-CM | POA: Insufficient documentation

## 2020-11-28 DIAGNOSIS — Z952 Presence of prosthetic heart valve: Secondary | ICD-10-CM | POA: Diagnosis not present

## 2020-11-28 DIAGNOSIS — I110 Hypertensive heart disease with heart failure: Secondary | ICD-10-CM | POA: Insufficient documentation

## 2020-11-28 DIAGNOSIS — Z794 Long term (current) use of insulin: Secondary | ICD-10-CM | POA: Insufficient documentation

## 2020-11-28 DIAGNOSIS — G4733 Obstructive sleep apnea (adult) (pediatric): Secondary | ICD-10-CM | POA: Insufficient documentation

## 2020-11-28 DIAGNOSIS — Z950 Presence of cardiac pacemaker: Secondary | ICD-10-CM | POA: Diagnosis not present

## 2020-11-28 DIAGNOSIS — Z7984 Long term (current) use of oral hypoglycemic drugs: Secondary | ICD-10-CM | POA: Insufficient documentation

## 2020-11-28 DIAGNOSIS — Z79899 Other long term (current) drug therapy: Secondary | ICD-10-CM | POA: Diagnosis not present

## 2020-11-28 LAB — BASIC METABOLIC PANEL
Anion gap: 7 (ref 5–15)
BUN: 56 mg/dL — ABNORMAL HIGH (ref 8–23)
CO2: 27 mmol/L (ref 22–32)
Calcium: 8.5 mg/dL — ABNORMAL LOW (ref 8.9–10.3)
Chloride: 101 mmol/L (ref 98–111)
Creatinine, Ser: 2.35 mg/dL — ABNORMAL HIGH (ref 0.61–1.24)
GFR, Estimated: 27 mL/min — ABNORMAL LOW (ref >60)
Glucose, Bld: 106 mg/dL — ABNORMAL HIGH (ref 70–99)
Potassium: 3.8 mmol/L (ref 3.5–5.1)
Sodium: 135 mmol/L (ref 135–145)

## 2020-11-28 LAB — SARS CORONAVIRUS 2 (TAT 6-24 HRS): SARS Coronavirus 2: NEGATIVE

## 2020-11-28 MED ORDER — TORSEMIDE 40 MG PO TABS: 80.0000 mg | ORAL_TABLET | Freq: Two times a day (BID) | ORAL | 5 refills | Status: DC

## 2020-11-28 NOTE — Pre-Procedure Instructions (Signed)
Attempted to call patient regarding procedure instructions for Monday.  Left voice mail on the following items:  Arrival time 1100 Nothing to eat or drink after midnight No meds AM of procedure Responsible person to drive you home and stay with you for 24 hrs Wash with special soap night before and morning of procedure If on anti-coagulant drug instructions Eliquis- last dose tonight 3/25

## 2020-11-28 NOTE — Progress Notes (Signed)
PCP: Dr Raoul Pitch EP: Dr Lovena Le Cardiology: Dr Tamala Julian  Primary HF Cardiologist: Dr Aundra Dubin   HPI: Todd Romero is a 87 year old with a history of chronic combined systolic and diastolic heart failure, last echo 1/21 showed reduced EF 35 to 40% with moderate LVH. RV normal. Also history of severe aortic stenosis status post TAVR in2018, symptomatic bradycardia/RBBBstatus post Medtronic permanent pacemaker implanted 10/2019, CAD, PAF, insulin-dependent diabetes, stage IVCKD, severe OSAintolerant toCPAP, hypertension, as well as history of carpal tunnel status post bilateralcarpal tunnel release.  Presented to the ED on 1/26. Markedly fluid overloaded.Started on IV lasix + empiric milrinone.   His device was interrogated and showed he had been in persistent atrial fibrillation since 09/06/20 w/ 34% RV pacing. Underwent TEE guided DCCV back to NSR. TEE (2/1) with EF 25-30%, mildly decreased RV systolic function, normal function of TAVR valve. Hospital course complicated by AKI/urosepsis. Work up for amyloid was negative. Myeloma panel,urine IFXN, and PYP were negative. Will need followup with EP =>discussed with Dr. Quentin Ore, suspect he will need CRT with high percentage (>30%) RV pacing and a-pacing with very long PR interval. Discharged to SNF, [facility]. Discharge weight was 223 pounds.   He was sent to [facility] 10/19/20 for anxiety/tremors. CT of head was negative for hemorrhage. He was sent back to SNF.  Creatinine 1.8  And K 5.1.   On 3/18 he was referred to [facility] for volume overload. Overall he received 6 doses of IV lasix over the 4 days.   He was seen in the clinic 3/22 and he remained volume overloaded. [facility] was instructed to give  80 mg IV lasix x1 + 3/23 &3/24 5 mg metolazone once  + 80 mg IV twice daily + 20 meq potassium.  Planning for device upgrade 12/01/20   Today he returns for HF follow up with his wife. Followed daily by [facility]. Overall  feeling fine. Denies SOB/PND/Orthopnea. Having ongoing leg edema.  Appetite ok. No fever or chills. Weight at home 236-->234 pounds. Taking all medications  Cardiac Studies Echo 10/03/20 EF 30-35% RV low normal , LA/RA severely dilated. AVR. (TAVR)  Echo 09/13/2019 EF 35-40%  Grade II DD. RV normal  Echo 2019 EF 43%   PYP 10/2020 not suggestive TTR.   Labs  10/21/20 K 5.1 Creatinine 1.8  10/14/20 K 3.6 Creatinine 2.25  11/24/20: Creatinine 2.08 3/24 creatinine. 2.2  ROS: All systems negative except as listed in HPI, PMH and Problem List.  SH:  Social History   Socioeconomic History   Marital status: Married    Spouse name: Not on file   Number of children: 4   Years of education: Not on file   Highest education level: Not on file  Occupational History   Occupation: Construction-Retired  Tobacco Use   Smoking status: Never Smoker   Smokeless tobacco: Never Used  Scientific laboratory technician Use: Never used  Substance and Sexual Activity   Alcohol use: No   Drug use: No   Sexual activity: Not Currently    Partners: Female  Other Topics Concern   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, retired Mining engineer:      -smoke alarm in the home:Yes     - wears seatbelt: Yes     - Feels safe in their relationships: Yes   Social Determinants of Radio broadcast assistant Strain: Not on  file  Food Insecurity: Not on file  Transportation Needs: Not on file  Physical Activity: Not on file  Stress: Not on file  Social Connections: Not on file  Intimate Partner Violence: Not on file    FH:  Family History  Problem Relation Age of Onset   Heart failure Father    Emphysema Father    COPD Father    Early death Father    Heart attack Father    Hypertension Mother    Early death Mother    Hyperlipidemia Mother    Stroke Mother    Cancer Sister    Hypertension Sister     Healthy Sister     Past Medical History:  Diagnosis Date   AKI (acute kidney injury) ([place]) 10/24/2019   Anemia    Arthritis    CAD (coronary artery disease)    a. cardiac cath 12/2016 showing moderate AS, elevated LVEDP, heavy 3V coronary calcification, 100% mD2, 30-50% LAD, 50-90% stenosis of Cx proximal to origin of L-PDA, RCA not engaged due to poor catheter control (difficult procedure) - coronary status was essentially unchanged from prior.   Chicken pox    Chronic diastolic CHF (congestive heart failure) ([facility])    Colon polyps    Degenerative arthritis    Diabetes ([place])    Dyspnea    Dyspnea on exertion 02/11/2015   Essential hypertension    Heart disease    Heme positive stool 11/16/2017   Hyperlipidemia    Intermediate stage nonexudative age-related macular degeneration of both eyes 02/27/2015   Leukocytosis 11/16/2017   New onset a-fib ([place]) 03/04/2018   Obesity (BMI 30-39.9) 06/18/2015   OSA (obstructive sleep apnea)    Severe with AHI 27/hr now on CPAP  not compliant with treatment.   Peritonitis ([place]) 1985?   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 s/p TAVR 03/2017.   Symptomatic bradycardia 10/24/2019   Type II or unspecified type diabetes mellitus without mention of complication, not stated as uncontrolled    Varicose vein of leg     Current Outpatient Medications  Medication Sig Dispense Refill   ACCU-CHEK AVIVA PLUS test strip USE AS INSTRUCTED TO CHECK BLOOD SUGAR TWICE A DAY DX:E11.65 100 strip 2   acetaminophen (TYLENOL) 500 MG tablet Take 500 mg by mouth every 4 (four) hours as needed  for moderate pain or headache.     amiodarone (PACERONE) 200 MG tablet Take 1 tablet (200 mg total) by mouth daily. 30 tablet 5   apixaban (ELIQUIS) 2.5 MG TABS tablet Take 1 tablet (2.5 mg total) by mouth 2 (two) times daily. 180 tablet 1   B-D UF III MINI PEN NEEDLES 31G X 5 MM MISC USE 2 PEN NEEDLE PER DAY WITH HUMALOG AND VICTOZA 200 each 3   ferrous sulfate 325 (65 FE) MG tablet Take 325 mg by mouth 2 (two) times daily with a meal.      glimepiride (AMARYL) 2 MG tablet TAKE 1 TABLET DAILY BEFORE BREAKFAST 90 tablet 3   HUMALOG KWIKPEN 100 UNIT/ML KwikPen INJECT 12 UNITS INTO THE SKIN 2 (TWO) TIMES  DAILY BEFORE LUNCH AND SUPPER. INJECT 12 UNITS UNDER SKIN BEFORE LUNCH AND DINNER. MAY ALSO INJECT 4 UNIT AT BREAKFAST IS SUGAR READING IS OVER 100. 15 mL 1   insulin NPH Human (HUMULIN N) 100 UNIT/ML injection Inject 26 units of Humulin N under the skin once daily at bedtime. (Patient taking differently: Inject 26 units of Humulin N under the skin once daily at bedtime.) 90 mL 1   Insulin Syringe-Needle U-100 (BD INSULIN SYRINGE U/F) 31G X 5/16" 1 ML MISC Use to inject insulin daily. DX:E11.9 100 each 1   liraglutide (VICTOZA) 18 MG/3ML SOPN Inject 1.8 mg daily 27 mL 3   metolazone (ZAROXOLYN) 5 MG tablet Take 1 tablet (5 mg total) by mouth daily. 15 tablet 1   metoprolol succinate (TOPROL-XL) 25 MG 24 hr tablet Take 25 mg by mouth daily.     Multiple Vitamin (MULTIVITAMIN WITH MINERALS) TABS tablet Take 1 tablet by mouth daily. Mens One a day Vit     Multiple Vitamins-Minerals (PRESERVISION AREDS 2 PO) Take 1 capsule by mouth 2 (two) times daily.     pantoprazole (PROTONIX) 40 MG tablet Take 1 tablet (40 mg total) by mouth 2 (two) times daily. 180 tablet 3   potassium chloride SA (KLOR-CON M20) 20 MEQ tablet Take when taking dose of Metolazone 90 tablet 3   pravastatin (PRAVACHOL) 80 MG tablet Take 1 tablet (80 mg total) by mouth every evening. 30 tablet 0   tamsulosin (FLOMAX) 0.4  MG CAPS capsule Take 1 capsule daily 90 capsule 1   Torsemide 40 MG TABS Take 2 tablets by mouth in the morning and at bedtime. 80 mg twice daily     No current facility-administered medications for this encounter.    Vitals:   11/28/20 1024  BP: (!) 170/100  Pulse: 70  SpO2: 98%  Weight: 103 kg (227 lb)   Wt Readings from Last 3 Encounters:  11/28/20 103 kg (227 lb)  11/25/20 109.2 kg (240 lb 12.8 oz)  11/21/20 108.3 kg (238 lb 12.8 oz)   Reds Clip 40% (down from 47% earlier this week)  PHYSICAL EXAM: General:  Arrived in a wheel chair.  No resp difficulty HEENT: normal Neck: supple. no JVD. Carotids 2+ bilat; no bruits. No lymphadenopathy or thryomegaly appreciated. Cor: PMI nondisplaced. Regular rate & rhythm. No rubs, gallops or murmurs. Lungs: clear Abdomen: soft, nontender, nondistended. No hepatosplenomegaly. No bruits or masses. Good bowel sounds. Extremities: no cyanosis, clubbing, rash, R and LLE with unna boots. 1+ edema above leg wraps.  Neuro: alert & orientedx3, cranial nerves grossly intact. moves all 4 extremities w/o difficulty. Affect pleasant    ASSESSMENT & PLAN: 1. Combined Systolic/Diastolic Heart Failure  Etiology uncertain, last cath in 2018 pre-TAVR with occluded D2 and 90% stenosis left PDA. He had MDT PPM for symptomatic bradycardia in 2/21 (after last echo). He is RV pacing 34% of the time on device interrogation this admission. Additionally, he has been in atrial fibrillation since the beginning of January. Echo  10/03/20 EF 30-35% RV mildly reduced TAVR ok.  TEE (2/1) with EF 25-30%, mildly decreased RV systolic function, normal function of TAVR valve. TEE-DCCV 2/1 with conversion to NSR but then back to atrial fibrillation>>converted back to NSR on amio gtt - PYP not suggestive of TTR. Discussed results today  -NYHA III. - Volume status improved after several days of IV lasix . Will give one day of IV lasix then tomorrow will switch to torsemide 80  mg twice  a day.  - Check BMEt today.  --Not on arni, mra with CKDI Stage IV -He did not tolerarte farxiga - Plan for device upgrade 3/28 by Dr Lovena Le.  - Discussed low salt food choices and limiting fluid intake to < 2 liters per day.   2. PAF TEE-DCCV on 2/1 initially converted him to NSR but back to atrial fibrillation soon after.  Converted back to NSR on 2/2 with amiodarone - Regular on exam.  - Continue amio 200 mg daily - off elquis for procedure next week.   3. CAD Cath in 2018 pre-TAVR with occluded D2 and 90% stenosis left PDA.  -On elqiuis + statin - No chest pain.   4. CKD Stager IV Follow up by Nephrology  - 11/24/20 Creatinine 2.08 - Check BMET today.   5. Bradycardia --> PPM Medtronic  Follows with Dr Lovena Le. Plans for device upgrade 12/01/20  I have personally called [facility] Team. Give 80 mg IV lasix today. Will need to change unna boots today. Continue [facility].    Arnisha Laffoon NP-C  10:30 AM

## 2020-11-28 NOTE — Patient Instructions (Addendum)
Labs done today. We will contact you only if your labs are abnormal.  START Torsemide 80mg  (2 tablets) by mouth 2 times daily tomorrow March 26th 2022  No other medication changes were made. Please continue all current medications as prescribed.  Keep your pending appointment with Dr. Aundra Dubin on April 19th, 2022 at [facility]  If you have any questions or concerns before your next appointment please send [place] a message through [place] or call our office at [phone].    TO LEAVE A MESSAGE FOR THE NURSE SELECT OPTION 2, PLEASE LEAVE A MESSAGE INCLUDING: . YOUR NAME . DATE OF BIRTH . CALL BACK NUMBER . REASON FOR CALL**this is important as we prioritize the call backs  YOU WILL RECEIVE A CALL BACK THE SAME DAY AS LONG AS YOU CALL BEFORE 4:00 PM   Do the following things EVERYDAY: 1) Weigh yourself in the morning before breakfast. Write it down and keep it in a log. 2) Take your medicines as prescribed 3) Eat low salt foods--Limit salt (sodium) to 2000 mg per day.  4) Stay as active as you can everyday 5) Limit all fluids for the day to less than 2 liters   At the [facility], you and your health needs are our priority. As part of our continuing mission to provide you with exceptional heart care, we have created designated Provider Care Teams. These Care Teams include your primary Cardiologist (physician) and Advanced Practice Providers (APPs- Physician Assistants and Nurse Practitioners) who all work together to provide you with the care you need, when you need it.   You may see any of the following providers on your designated Care Team at your next follow up: [name] Dr Glori Bickers . Dr [name] . Darrick Grinder, NP . Lyda Jester, PA . Audry Riles, PharmD   Please be sure to bring in all your medications bottles to every appointment.

## 2020-12-01 ENCOUNTER — Ambulatory Visit (HOSPITAL_COMMUNITY): Payer: Medicare Other

## 2020-12-01 ENCOUNTER — Other Ambulatory Visit: Payer: Self-pay

## 2020-12-01 ENCOUNTER — Ambulatory Visit (HOSPITAL_COMMUNITY)
Admission: RE | Admit: 2020-12-01 | Discharge: 2020-12-01 | Disposition: A | Discharge status: Home / Self Care | Payer: Medicare Other | Attending: Internal Medicine | Admitting: Internal Medicine

## 2020-12-01 ENCOUNTER — Encounter (HOSPITAL_COMMUNITY)
Admission: RE | Disposition: A | Discharge status: Home / Self Care | Payer: Medicare Other | Source: Home / Self Care | Attending: Internal Medicine

## 2020-12-01 ENCOUNTER — Encounter (HOSPITAL_COMMUNITY): Payer: Self-pay | Admitting: Internal Medicine

## 2020-12-01 ENCOUNTER — Other Ambulatory Visit (HOSPITAL_COMMUNITY): Payer: Self-pay | Admitting: Cardiology

## 2020-12-01 DIAGNOSIS — G4733 Obstructive sleep apnea (adult) (pediatric): Secondary | ICD-10-CM | POA: Diagnosis not present

## 2020-12-01 DIAGNOSIS — I130 Hypertensive heart and chronic kidney disease with heart failure and stage 1 through stage 4 chronic kidney disease, or unspecified chronic kidney disease: Secondary | ICD-10-CM | POA: Diagnosis not present

## 2020-12-01 DIAGNOSIS — Z45010 Encounter for checking and testing of cardiac pacemaker pulse generator [battery]: Secondary | ICD-10-CM | POA: Diagnosis not present

## 2020-12-01 DIAGNOSIS — E669 Obesity, unspecified: Secondary | ICD-10-CM | POA: Diagnosis not present

## 2020-12-01 DIAGNOSIS — I5042 Chronic combined systolic (congestive) and diastolic (congestive) heart failure: Secondary | ICD-10-CM | POA: Diagnosis not present

## 2020-12-01 DIAGNOSIS — Z8249 Family history of ischemic heart disease and other diseases of the circulatory system: Secondary | ICD-10-CM | POA: Diagnosis not present

## 2020-12-01 DIAGNOSIS — I442 Atrioventricular block, complete: Secondary | ICD-10-CM | POA: Diagnosis not present

## 2020-12-01 DIAGNOSIS — N189 Chronic kidney disease, unspecified: Secondary | ICD-10-CM | POA: Diagnosis not present

## 2020-12-01 DIAGNOSIS — E1122 Type 2 diabetes mellitus with diabetic chronic kidney disease: Secondary | ICD-10-CM | POA: Diagnosis not present

## 2020-12-01 DIAGNOSIS — Z6837 Body mass index (BMI) 37.0-37.9, adult: Secondary | ICD-10-CM | POA: Diagnosis not present

## 2020-12-01 DIAGNOSIS — I350 Nonrheumatic aortic (valve) stenosis: Secondary | ICD-10-CM | POA: Diagnosis not present

## 2020-12-01 DIAGNOSIS — I2510 Atherosclerotic heart disease of native coronary artery without angina pectoris: Secondary | ICD-10-CM | POA: Insufficient documentation

## 2020-12-01 DIAGNOSIS — Z950 Presence of cardiac pacemaker: Secondary | ICD-10-CM

## 2020-12-01 HISTORY — DX: Presence of cardiac pacemaker: Z95.0

## 2020-12-01 HISTORY — PX: BIV UPGRADE: EP1202

## 2020-12-01 LAB — GLUCOSE, CAPILLARY
Glucose-Capillary: 72 mg/dL (ref 70–99)
Glucose-Capillary: 74 mg/dL (ref 70–99)
Glucose-Capillary: 89 mg/dL (ref 70–99)

## 2020-12-01 SURGERY — BIV UPGRADE

## 2020-12-01 MED ORDER — LIDOCAINE HCL (PF) 1 % IJ SOLN
INTRAMUSCULAR | Status: DC | PRN
  Administered 2020-12-01: 60 mL

## 2020-12-01 MED ORDER — LIDOCAINE HCL (PF) 1 % IJ SOLN
INTRAMUSCULAR | Status: AC
  Filled 2020-12-01: qty 60

## 2020-12-01 MED ORDER — CEFAZOLIN SODIUM-DEXTROSE 1-4 GM/50ML-% IV SOLN: 1.0000 g | Freq: Once | INTRAVENOUS | Status: DC

## 2020-12-01 MED ORDER — MIDAZOLAM HCL 5 MG/5ML IJ SOLN
INTRAMUSCULAR | Status: AC
  Filled 2020-12-01: qty 5

## 2020-12-01 MED ORDER — HEPARIN (PORCINE) IN NACL 1000-0.9 UT/500ML-% IV SOLN
INTRAVENOUS | Status: DC | PRN
  Administered 2020-12-01: 500 mL

## 2020-12-01 MED ORDER — CHLORHEXIDINE GLUCONATE 4 % EX LIQD: 4.0000 "application " | Freq: Once | CUTANEOUS | Status: DC

## 2020-12-01 MED ORDER — SODIUM CHLORIDE 0.9 % IV SOLN: INTRAVENOUS | Status: DC

## 2020-12-01 MED ORDER — IOHEXOL 350 MG/ML SOLN
INTRAVENOUS | Status: DC | PRN
  Administered 2020-12-01 (×2): 10 mL

## 2020-12-01 MED ORDER — FENTANYL CITRATE (PF) 100 MCG/2ML IJ SOLN
INTRAMUSCULAR | Status: AC
  Filled 2020-12-01: qty 2

## 2020-12-01 MED ORDER — POVIDONE-IODINE 10 % EX SWAB: 2.0000 "application " | Freq: Once | CUTANEOUS | Status: DC

## 2020-12-01 MED ORDER — ONDANSETRON HCL 4 MG/2ML IJ SOLN: 4.0000 mg | Freq: Four times a day (QID) | INTRAMUSCULAR | Status: DC | PRN

## 2020-12-01 MED ORDER — CEFAZOLIN SODIUM-DEXTROSE 2-4 GM/100ML-% IV SOLN
2.0000 g | INTRAVENOUS | Status: AC
  Administered 2020-12-01: 2 g via INTRAVENOUS

## 2020-12-01 MED ORDER — MIDAZOLAM HCL 5 MG/5ML IJ SOLN
INTRAMUSCULAR | Status: DC | PRN
  Administered 2020-12-01 (×2): 1 mg via INTRAVENOUS

## 2020-12-01 MED ORDER — SODIUM CHLORIDE 0.9 % IV SOLN
INTRAVENOUS | Status: AC
  Filled 2020-12-01: qty 2

## 2020-12-01 MED ORDER — FENTANYL CITRATE (PF) 100 MCG/2ML IJ SOLN
INTRAMUSCULAR | Status: DC | PRN
  Administered 2020-12-01: 12.5 ug via INTRAVENOUS
  Administered 2020-12-01: 25 ug via INTRAVENOUS

## 2020-12-01 MED ORDER — ACETAMINOPHEN 325 MG PO TABS: 325.0000 mg | ORAL_TABLET | ORAL | Status: DC | PRN

## 2020-12-01 MED ORDER — SODIUM CHLORIDE 0.9 % IV SOLN
80.0000 mg | INTRAVENOUS | Status: AC
  Administered 2020-12-01: 80 mg

## 2020-12-01 MED ORDER — CEFAZOLIN SODIUM-DEXTROSE 2-4 GM/100ML-% IV SOLN
INTRAVENOUS | Status: AC
  Filled 2020-12-01: qty 100

## 2020-12-01 SURGICAL SUPPLY — 13 items
CABLE SURGICAL S-101-97-12 (CABLE) ×2 IMPLANT
CATH ATTAIN COM SURV 6250V-MB2 (CATHETERS) ×1 IMPLANT
CATH JSN HEX 2-5-2 120 (CATHETERS) ×1 IMPLANT
GUIDEWIRE ANGLED .035X150CM (WIRE) ×3 IMPLANT
LEAD ATTAIN ABILITY 4396-88CM (Lead) ×1 IMPLANT
PACEMAKER PRCT MRI CRTP W1TR01 (Pacemaker) IMPLANT
PAD PRO RADIOLUCENT 2001M-C (PAD) ×2 IMPLANT
PPM PRECEPTA MRI CRT-P W1TR01 (Pacemaker) ×2 IMPLANT
SHEATH 9.5FR PRELUDE SNAP 13 (SHEATH) ×1 IMPLANT
SLITTER 6232ADJ (MISCELLANEOUS) ×1 IMPLANT
TRAY PACEMAKER INSERTION (PACKS) ×2 IMPLANT
WIRE ACUITY WHISPER EDS 4648 (WIRE) ×1 IMPLANT
WIRE MAILMAN 182CM (WIRE) ×1 IMPLANT

## 2020-12-01 NOTE — Discharge Instructions (Signed)
    Supplemental Discharge Instructions for  Pacemaker/Defibrillator Patients  Tomorrow, 12/02/20, send in a device transmission  Activity No heavy lifting or vigorous activity with your left/right arm for 6 to 8 weeks.  Do not raise your left/right arm above your head for one week.  Gradually raise your affected arm as drawn below.              12/06/20                      12/07/20                       12/08/20                    12/09/20 __  NO DRIVING for  1 week   ; you may begin driving on  5/0/38   .  WOUND CARE - Keep the wound area clean and dry.  Do not get this area wet , no showers until cleared to at your wound check visit. - Tomorrow, 12/02/20, remove the arm sling - Tomorrow, remove the large outer plastic bandage.  Underneath the plastic bandage there are steri strips (paper tapes), DO NOT remove these. - The tape/steri-strips on your wound will fall off; do not pull them off.  No bandage is needed on the site.  DO  NOT apply any creams, oils, or ointments to the wound area. - If you notice any drainage or discharge from the wound, any swelling or bruising at the site, or you develop a fever > 101? F after you are discharged home, call the office at once.  Special Instructions - You are still able to use cellular telephones; use the ear opposite the side where you have your pacemaker/defibrillator.  Avoid carrying your cellular phone near your device. - When traveling through airports, show security personnel your identification card to avoid being screened in the metal detectors.  Ask the security personnel to use the hand wand. - Avoid arc welding equipment, MRI testing (magnetic resonance imaging), TENS units (transcutaneous nerve stimulators).  Call the office for questions about other devices. - Avoid electrical appliances that are in poor condition or are not properly grounded. - Microwave ovens are safe to be near or to operate.

## 2020-12-01 NOTE — Progress Notes (Signed)
Pt ambulated without difficulty or bleeding.   Discharged home with wife who will drive and stay with pt x 24 hrs. Pts teaching is complete, CXR complete and evaluated by Dr Lovena Le. Device interrogated by [employer].

## 2020-12-01 NOTE — Interval H&P Note (Signed)
History and Physical Interval Note:  12/01/2020 11:36 AM  Todd Romero  has presented today for surgery, with the diagnosis of cardiomyopathy.  The various methods of treatment have been discussed with the patient and family. After consideration of risks, benefits and other options for treatment, the patient has consented to  Procedure(s): BIV UPGRADE (N/A) as a surgical intervention.  The patient's history has been reviewed, patient examined, no change in status, stable for surgery.  I have reviewed the patient's chart and labs.  Questions were answered to the patient's satisfaction.     [name]

## 2020-12-02 ENCOUNTER — Encounter (HOSPITAL_COMMUNITY): Payer: Self-pay | Admitting: Internal Medicine

## 2020-12-02 NOTE — Progress Notes (Signed)
Post procedure call made, pt is doing well and understood d/c instructions

## 2020-12-03 ENCOUNTER — Encounter (HOSPITAL_COMMUNITY): Payer: Self-pay | Admitting: Internal Medicine

## 2020-12-03 ENCOUNTER — Telehealth: Payer: Self-pay

## 2020-12-03 NOTE — Telephone Encounter (Signed)
Follow-up after same day discharge: Implant date: 12/01/20 MD: [name], MD Device: Medtronic Percepta Location: Left chest   Wound check visit: 12/11/20 @ 2:40 90 day MD follow-up: 03/12/21 @ 3:45  Remote Transmission received: Yes  Dressing removed: Yes Sling Removed: Yes  Spoke to wife [place] ([place])

## 2020-12-03 NOTE — Telephone Encounter (Signed)
-----   Message from [place], [place] sent at 12/02/2020  7:05 PM EDT ----- Same day d/c Monday MDT CRT upgrade GT

## 2020-12-05 ENCOUNTER — Other Ambulatory Visit (HOSPITAL_COMMUNITY): Payer: Self-pay | Admitting: Cardiology

## 2020-12-07 ENCOUNTER — Other Ambulatory Visit: Payer: Self-pay | Admitting: Endocrinology

## 2020-12-07 DIAGNOSIS — E1165 Type 2 diabetes mellitus with hyperglycemia: Secondary | ICD-10-CM

## 2020-12-07 DIAGNOSIS — Z794 Long term (current) use of insulin: Secondary | ICD-10-CM

## 2020-12-08 ENCOUNTER — Telehealth (HOSPITAL_COMMUNITY): Payer: Self-pay | Admitting: *Deleted

## 2020-12-08 ENCOUNTER — Ambulatory Visit: Payer: Medicare Other | Admitting: Interventional Cardiology

## 2020-12-08 NOTE — Telephone Encounter (Signed)
pts wife left VM stating pt is up 6lbs x 1 week and is full of fluid. Pt has office visit 4/19 with Dr.McLean. Wife wants to know if pt needs to take any extra medication.     Routed to [place]

## 2020-12-08 NOTE — Telephone Encounter (Signed)
Give metolazone 2.5 x 1 with next torsemide dose (tomorrow am).  Give extra KCl 20 with metolazone.

## 2020-12-09 NOTE — Telephone Encounter (Signed)
Called pt to advise. Pts wife said remote health was in the home and had given patient 5mg  of metolazone yesterday. Labs were drawn and unna boots placed.

## 2020-12-11 ENCOUNTER — Other Ambulatory Visit: Payer: Self-pay

## 2020-12-11 ENCOUNTER — Ambulatory Visit (INDEPENDENT_AMBULATORY_CARE_PROVIDER_SITE_OTHER): Payer: Medicare Other | Admitting: Emergency Medicine

## 2020-12-11 DIAGNOSIS — I5042 Chronic combined systolic (congestive) and diastolic (congestive) heart failure: Secondary | ICD-10-CM

## 2020-12-11 DIAGNOSIS — I495 Sick sinus syndrome: Secondary | ICD-10-CM | POA: Diagnosis not present

## 2020-12-12 ENCOUNTER — Telehealth (HOSPITAL_COMMUNITY): Payer: Self-pay | Admitting: *Deleted

## 2020-12-12 NOTE — Progress Notes (Signed)
Wound check appointment. Steri-strips removed. Wound without redness or edema. Incision edges approximated, wound well healed. Normal device function. Thresholds, sensing, and impedances consistent with implant measurements. Device programmed at 2x saftey margin for chronic RA/RV settings and 4V for New LV lead for extra safety margin until 3 month visit. BVP @ 100% Histogram distribution appropriate for patient and level of activity. No mode switches or high ventricular rates noted. Patient educated about wound care, arm mobility, lifting restrictions. Patient enrolled in remote monitoring with next transmission with next remote 01/28/21. ROV in 3 months with Dr. Lovena Le

## 2020-12-12 NOTE — Telephone Encounter (Signed)
pts wife called requesting sooner appt but pt has remote health and appt scheduled 4/19. No sooner appts available. Wife said pt needs to be seen sooner because he has no energy. Called back to get more information no answer/ left message requesting return call.

## 2020-12-13 ENCOUNTER — Emergency Department (HOSPITAL_COMMUNITY)
Admission: EM | Admit: 2020-12-13 | Discharge: 2020-12-13 | Disposition: A | Discharge status: Home / Self Care | Payer: Medicare Other | Attending: Emergency Medicine | Admitting: Emergency Medicine

## 2020-12-13 ENCOUNTER — Emergency Department (HOSPITAL_COMMUNITY): Payer: Medicare Other

## 2020-12-13 ENCOUNTER — Encounter (HOSPITAL_COMMUNITY): Payer: Self-pay

## 2020-12-13 DIAGNOSIS — S0990XA Unspecified injury of head, initial encounter: Secondary | ICD-10-CM | POA: Diagnosis present

## 2020-12-13 DIAGNOSIS — Z7901 Long term (current) use of anticoagulants: Secondary | ICD-10-CM | POA: Insufficient documentation

## 2020-12-13 DIAGNOSIS — S0101XA Laceration without foreign body of scalp, initial encounter: Secondary | ICD-10-CM | POA: Diagnosis not present

## 2020-12-13 DIAGNOSIS — W01198A Fall on same level from slipping, tripping and stumbling with subsequent striking against other object, initial encounter: Secondary | ICD-10-CM | POA: Diagnosis not present

## 2020-12-13 DIAGNOSIS — I4891 Unspecified atrial fibrillation: Secondary | ICD-10-CM | POA: Diagnosis not present

## 2020-12-13 DIAGNOSIS — W19XXXA Unspecified fall, initial encounter: Secondary | ICD-10-CM

## 2020-12-13 DIAGNOSIS — E119 Type 2 diabetes mellitus without complications: Secondary | ICD-10-CM | POA: Insufficient documentation

## 2020-12-13 HISTORY — DX: Type 2 diabetes mellitus without complications: E11.9

## 2020-12-13 LAB — CBC WITH DIFFERENTIAL/PLATELET
Abs Immature Granulocytes: 0.03 10*3/uL (ref 0.00–0.07)
Basophils Absolute: 0 10*3/uL (ref 0.0–0.1)
Basophils Relative: 1 %
Eosinophils Absolute: 0.2 10*3/uL (ref 0.0–0.5)
Eosinophils Relative: 2 %
HCT: 29.6 % — ABNORMAL LOW (ref 39.0–52.0)
Hemoglobin: 9.5 g/dL — ABNORMAL LOW (ref 13.0–17.0)
Immature Granulocytes: 0 %
Lymphocytes Relative: 20 %
Lymphs Abs: 1.5 10*3/uL (ref 0.7–4.0)
MCH: 29.3 pg (ref 26.0–34.0)
MCHC: 32.1 g/dL (ref 30.0–36.0)
MCV: 91.4 fL (ref 80.0–100.0)
Monocytes Absolute: 1.2 10*3/uL — ABNORMAL HIGH (ref 0.1–1.0)
Monocytes Relative: 17 %
Neutro Abs: 4.3 10*3/uL (ref 1.7–7.7)
Neutrophils Relative %: 60 %
Platelets: 174 10*3/uL (ref 150–400)
RBC: 3.24 MIL/uL — ABNORMAL LOW (ref 4.22–5.81)
RDW: 17.8 % — ABNORMAL HIGH (ref 11.5–15.5)
WBC: 7.2 10*3/uL (ref 4.0–10.5)
nRBC: 0 % (ref 0.0–0.2)

## 2020-12-13 LAB — URINALYSIS, ROUTINE W REFLEX MICROSCOPIC
Bilirubin Urine: NEGATIVE
Glucose, UA: NEGATIVE mg/dL
Hgb urine dipstick: NEGATIVE
Ketones, ur: NEGATIVE mg/dL
Nitrite: NEGATIVE
Protein, ur: NEGATIVE mg/dL
Specific Gravity, Urine: 1.009 (ref 1.005–1.030)
pH: 6 (ref 5.0–8.0)

## 2020-12-13 LAB — BASIC METABOLIC PANEL
Anion gap: 12 (ref 5–15)
BUN: 92 mg/dL — ABNORMAL HIGH (ref 8–23)
CO2: 29 mmol/L (ref 22–32)
Calcium: 8.9 mg/dL (ref 8.9–10.3)
Chloride: 94 mmol/L — ABNORMAL LOW (ref 98–111)
Creatinine, Ser: 2.65 mg/dL — ABNORMAL HIGH (ref 0.61–1.24)
GFR, Estimated: 23 mL/min — ABNORMAL LOW (ref >60)
Glucose, Bld: 187 mg/dL — ABNORMAL HIGH (ref 70–99)
Potassium: 2.9 mmol/L — ABNORMAL LOW (ref 3.5–5.1)
Sodium: 135 mmol/L (ref 135–145)

## 2020-12-13 MED ORDER — POTASSIUM CHLORIDE 20 MEQ PO PACK
40.0000 meq | PACK | Freq: Every day | ORAL | Status: DC
  Administered 2020-12-13: 40 meq via ORAL
  Filled 2020-12-13: qty 2

## 2020-12-13 MED ORDER — ACETAMINOPHEN 500 MG PO TABS
1000.0000 mg | ORAL_TABLET | Freq: Once | ORAL | Status: AC
  Administered 2020-12-13: 1000 mg via ORAL
  Filled 2020-12-13: qty 2

## 2020-12-13 MED ORDER — LIDOCAINE-EPINEPHRINE (PF) 2 %-1:200000 IJ SOLN
20.0000 mL | Freq: Once | INTRAMUSCULAR | Status: AC
  Administered 2020-12-13: 20 mL via INTRADERMAL
  Filled 2020-12-13: qty 20

## 2020-12-13 NOTE — Discharge Instructions (Signed)
Follow-up with your primary doctor in 1 week for staple removal.  Return if you have any additional falls, episodes of passing out or lightheadedness or dizziness.

## 2020-12-13 NOTE — ED Notes (Signed)
Ambulated pt in the room, pt stated he did not feel any more dizzy than normal. Pt stated he uses a cane at home but was steady with a standby assist.

## 2020-12-13 NOTE — ED Provider Notes (Signed)
[facility] EMERGENCY DEPARTMENT Provider Note   CSN: [id number] Arrival date & time: 12/13/20  6712     History Chief Complaint  Patient presents with  . Fall    Pt got up to stand became dizzy and fell hitting head on fireplace. Noted 4cm lac to top of head. Takes eliquis.     Todd Romero is a 87 y.o. male.    Level 5 caveat history limited due to acuity.  Level 2 trauma due to head trauma on blood thinners.  Presents to ER after fall.  EMS initially reported the patient became dizzy and hit his head on the fireplace.  Patient states that he did not have dizziness lightheadedness or syncope.  Thinks he lost his balance.  Has extensive medical history including atrial fibrillation and is on anticoagulation chronically.  Currently has no pain.  HPI     Past Medical History:  Diagnosis Date  . Diabetes mellitus without complication ([place])     There are no problems to display for this patient.   No family history on file.  Social History   Substance Use Topics  . Alcohol use: Not Currently  . Drug use: Not Currently    Home Medications Prior to Admission medications   Not on File    Allergies    Bactrim [sulfamethoxazole-trimethoprim] and Morphine and related  Review of Systems   Review of Systems  Constitutional: Negative for chills and fever.  HENT: Negative for ear pain and sore throat.        Head trauma  Eyes: Negative for pain and visual disturbance.  Respiratory: Negative for cough and shortness of breath.   Cardiovascular: Negative for chest pain and palpitations.  Gastrointestinal: Negative for abdominal pain and vomiting.  Genitourinary: Negative for dysuria and hematuria.  Musculoskeletal: Negative for arthralgias and back pain.  Skin: Negative for color change and rash.  Neurological: Negative for seizures and syncope.  All other systems reviewed and are negative.   Physical Exam Updated Vital Signs BP 113/73    Pulse 94   Temp (!) 97.5 F (36.4 C) (Oral)   Resp 16   Ht 5\' 7"  (1.702 m)   Wt 97.1 kg   SpO2 100%   BMI 33.52 kg/m   Physical Exam Vitals and nursing note reviewed.  Constitutional:      Appearance: He is well-developed.  HENT:     Head: Normocephalic.     Comments: 4 cm laceration over the top of occiput Eyes:     Conjunctiva/sclera: Conjunctivae normal.  Cardiovascular:     Rate and Rhythm: Normal rate and regular rhythm.     Heart sounds: No murmur heard.   Pulmonary:     Effort: Pulmonary effort is normal. No respiratory distress.     Breath sounds: Normal breath sounds.  Abdominal:     Palpations: Abdomen is soft.     Tenderness: There is no abdominal tenderness.  Musculoskeletal:     Cervical back: Neck supple.  Skin:    General: Skin is warm and dry.  Neurological:     Mental Status: He is alert.     ED Results / Procedures / Treatments   Labs (all labs ordered are listed, but only abnormal results are displayed) Labs Reviewed  CBC WITH DIFFERENTIAL/PLATELET - Abnormal; Notable for the following components:      Result Value   RBC 3.24 (*)    Hemoglobin 9.5 (*)    HCT 29.6 (*)  RDW 17.8 (*)    Monocytes Absolute 1.2 (*)    All other components within normal limits  BASIC METABOLIC PANEL - Abnormal; Notable for the following components:   Potassium 2.9 (*)    Chloride 94 (*)    Glucose, Bld 187 (*)    BUN 92 (*)    Creatinine, Ser 2.65 (*)    GFR, Estimated 23 (*)    All other components within normal limits  URINALYSIS, ROUTINE W REFLEX MICROSCOPIC    EKG None  Radiology DG Chest Portable 1 View  Result Date: 12/13/2020 CLINICAL DATA:  Trauma due to fall EXAM: PORTABLE CHEST 1 VIEW COMPARISON:  12/01/2020 FINDINGS: Cardiomegaly. Biventricular pacer. Transcatheter aortic valve replacement. There is no edema, consolidation, effusion, or pneumothorax. No detected acute fracture. IMPRESSION: No acute finding. Cardiomegaly without failure.  Electronically Signed   By: Monte Fantasia M.D.   On: 12/13/2020 06:56    Procedures .Marland KitchenLaceration Repair  Date/Time: 12/13/2020 7:36 AM Performed by: Lucrezia Starch, MD Authorized by: Lucrezia Starch, MD   Consent:    Consent obtained:  Verbal   Consent given by:  Patient   Risks, benefits, and alternatives were discussed: yes     Risks discussed:  Infection, need for additional repair and nerve damage   Alternatives discussed:  No treatment and delayed treatment Universal protocol:    Immediately prior to procedure, a time out was called: yes   Laceration details:    Location:  Scalp   Scalp location:  Crown   Length (cm):  4 Treatment:    Area cleansed with:  Povidone-iodine and saline   Amount of cleaning:  Extensive   Irrigation solution:  Sterile saline   Irrigation method:  Syringe   Debridement:  None Skin repair:    Repair method:  Staples   Number of staples:  3 Approximation:    Approximation:  Close Post-procedure details:    Procedure completion:  Tolerated well, no immediate complications     Medications Ordered in ED Medications  lidocaine-EPINEPHrine (XYLOCAINE W/EPI) 2 %-1:200000 (PF) injection 20 mL (has no administration in time range)    ED Course  I have reviewed the triage vital signs and the nursing notes.  Pertinent labs & imaging results that were available during my care of the patient were reviewed by me and considered in my medical decision making (see chart for details).    MDM Rules/Calculators/A&P                         87 year old male presents to ER with concern for head trauma on anticoagulation.  On exam patient is remarkably well-appearing in no acute distress.  Noted to have a laceration to his scalp.  Repaired with staples.  While awaiting basic labs, CT imaging, signed out to Dr. Ron Parker.  Anticipate discharge home.  Final Clinical Impression(s) / ED Diagnoses Final diagnoses:  Laceration of scalp, initial encounter     Rx / DC Orders ED Discharge Orders    None       Lucrezia Starch, MD 12/13/20 [phone]

## 2020-12-13 NOTE — ED Triage Notes (Signed)
Was going to stand became dizzy. Hit head on fire place noted 4cm lac totop of head. On eliquis. A/0 x4. C collar in place.

## 2020-12-13 NOTE — Progress Notes (Signed)
   12/13/20 0556  Clinical Encounter Type  Visited With Health care provider  Visit Type Trauma  Referral From Nurse  Consult/Referral To Chaplain   No current needs. Chaplain remains available.  This note was prepared by Chaplain Resident, Dante Gang, MDiv. Chaplain remains available as needed through the on-call pager: [phone].

## 2020-12-13 NOTE — ED Notes (Signed)
To CT @ this time in stretcher. C-collar in place

## 2020-12-13 NOTE — ED Notes (Signed)
[place] and xray @ bedside

## 2020-12-13 NOTE — ED Provider Notes (Signed)
Medical Decision Making: Care of patient assumed from Dr. Roslynn Amble at 0700.  Agree with history, physical exam and plan.  See their note for further details.  Briefly, The pt p/w fall after lightheadedness getting up from bed.  Screening labs show decreased hemoglobin consistent with baseline.  Elevated creatinine consistent with baseline.  Mild hypokalemia.  Will supplement..   Current plan is as follows: Needs EKG and CT imaging.  Has a small laceration already repaired.  CT imaging reviewed by myself and radiology.  No acute intracranial abnormality.  Scalp wounds repaired.  Patient is able to ambulate.  EKG shows paced rhythm with no significant other abnormalities.  He feels comfortable discharge home family feels comfortable as well.  Strict return precautions given   I personally reviewed and interpreted all labs/imaging.      Breck Coons, MD 12/13/20 [phone]

## 2020-12-15 ENCOUNTER — Encounter (HOSPITAL_COMMUNITY): Payer: Self-pay | Admitting: Internal Medicine

## 2020-12-16 ENCOUNTER — Ambulatory Visit (HOSPITAL_COMMUNITY)
Admission: RE | Admit: 2020-12-16 | Discharge: 2020-12-16 | Disposition: A | Discharge status: Home / Self Care | Payer: Medicare Other | Source: Ambulatory Visit | Attending: Cardiology | Admitting: Cardiology

## 2020-12-16 ENCOUNTER — Other Ambulatory Visit: Payer: Self-pay

## 2020-12-16 ENCOUNTER — Encounter (HOSPITAL_COMMUNITY): Payer: Self-pay

## 2020-12-16 VITALS — BP 136/80 | HR 70 | Wt 224.2 lb

## 2020-12-16 DIAGNOSIS — N184 Chronic kidney disease, stage 4 (severe): Secondary | ICD-10-CM | POA: Insufficient documentation

## 2020-12-16 DIAGNOSIS — Z794 Long term (current) use of insulin: Secondary | ICD-10-CM | POA: Diagnosis not present

## 2020-12-16 DIAGNOSIS — E1122 Type 2 diabetes mellitus with diabetic chronic kidney disease: Secondary | ICD-10-CM | POA: Insufficient documentation

## 2020-12-16 DIAGNOSIS — Z79899 Other long term (current) drug therapy: Secondary | ICD-10-CM | POA: Insufficient documentation

## 2020-12-16 DIAGNOSIS — I5042 Chronic combined systolic (congestive) and diastolic (congestive) heart failure: Secondary | ICD-10-CM

## 2020-12-16 DIAGNOSIS — I130 Hypertensive heart and chronic kidney disease with heart failure and stage 1 through stage 4 chronic kidney disease, or unspecified chronic kidney disease: Secondary | ICD-10-CM | POA: Insufficient documentation

## 2020-12-16 DIAGNOSIS — R001 Bradycardia, unspecified: Secondary | ICD-10-CM | POA: Insufficient documentation

## 2020-12-16 DIAGNOSIS — I951 Orthostatic hypotension: Secondary | ICD-10-CM | POA: Insufficient documentation

## 2020-12-16 DIAGNOSIS — I2510 Atherosclerotic heart disease of native coronary artery without angina pectoris: Secondary | ICD-10-CM | POA: Insufficient documentation

## 2020-12-16 DIAGNOSIS — G4733 Obstructive sleep apnea (adult) (pediatric): Secondary | ICD-10-CM | POA: Diagnosis not present

## 2020-12-16 DIAGNOSIS — Z950 Presence of cardiac pacemaker: Secondary | ICD-10-CM | POA: Diagnosis not present

## 2020-12-16 DIAGNOSIS — Z952 Presence of prosthetic heart valve: Secondary | ICD-10-CM | POA: Diagnosis not present

## 2020-12-16 DIAGNOSIS — I480 Paroxysmal atrial fibrillation: Secondary | ICD-10-CM | POA: Insufficient documentation

## 2020-12-16 DIAGNOSIS — Z7901 Long term (current) use of anticoagulants: Secondary | ICD-10-CM | POA: Diagnosis not present

## 2020-12-16 DIAGNOSIS — Z9181 History of falling: Secondary | ICD-10-CM | POA: Insufficient documentation

## 2020-12-16 DIAGNOSIS — I495 Sick sinus syndrome: Secondary | ICD-10-CM

## 2020-12-16 LAB — URINALYSIS, COMPLETE (UACMP) WITH MICROSCOPIC
Bilirubin Urine: NEGATIVE
Glucose, UA: NEGATIVE mg/dL
Hgb urine dipstick: NEGATIVE
Ketones, ur: NEGATIVE mg/dL
Leukocytes,Ua: NEGATIVE
Nitrite: NEGATIVE
Protein, ur: NEGATIVE mg/dL
Specific Gravity, Urine: 1.011 (ref 1.005–1.030)
pH: 5 (ref 5.0–8.0)

## 2020-12-16 LAB — FERRITIN: Ferritin: 90 ng/mL (ref 24–336)

## 2020-12-16 LAB — CBC
HCT: 30.9 % — ABNORMAL LOW (ref 39.0–52.0)
Hemoglobin: 10 g/dL — ABNORMAL LOW (ref 13.0–17.0)
MCH: 30.1 pg (ref 26.0–34.0)
MCHC: 32.4 g/dL (ref 30.0–36.0)
MCV: 93.1 fL (ref 80.0–100.0)
Platelets: 178 10*3/uL (ref 150–400)
RBC: 3.32 MIL/uL — ABNORMAL LOW (ref 4.22–5.81)
RDW: 17.8 % — ABNORMAL HIGH (ref 11.5–15.5)
WBC: 6.6 10*3/uL (ref 4.0–10.5)
nRBC: 0 % (ref 0.0–0.2)

## 2020-12-16 LAB — IRON AND TIBC
Iron: 39 ug/dL — ABNORMAL LOW (ref 45–182)
Saturation Ratios: 11 % — ABNORMAL LOW (ref 17.9–39.5)
TIBC: 363 ug/dL (ref 250–450)
UIBC: 324 ug/dL

## 2020-12-16 LAB — TSH: TSH: 0.697 u[IU]/mL (ref 0.350–4.500)

## 2020-12-16 LAB — COMPREHENSIVE METABOLIC PANEL
ALT: 14 U/L (ref 0–44)
AST: 20 U/L (ref 15–41)
Albumin: 3.5 g/dL (ref 3.5–5.0)
Alkaline Phosphatase: 77 U/L (ref 38–126)
Anion gap: 11 (ref 5–15)
BUN: 93 mg/dL — ABNORMAL HIGH (ref 8–23)
CO2: 29 mmol/L (ref 22–32)
Calcium: 8.9 mg/dL (ref 8.9–10.3)
Chloride: 94 mmol/L — ABNORMAL LOW (ref 98–111)
Creatinine, Ser: 2.63 mg/dL — ABNORMAL HIGH (ref 0.61–1.24)
GFR, Estimated: 23 mL/min — ABNORMAL LOW (ref >60)
Glucose, Bld: 146 mg/dL — ABNORMAL HIGH (ref 70–99)
Potassium: 3.5 mmol/L (ref 3.5–5.1)
Sodium: 134 mmol/L — ABNORMAL LOW (ref 135–145)
Total Bilirubin: 0.7 mg/dL (ref 0.3–1.2)
Total Protein: 6.7 g/dL (ref 6.5–8.1)

## 2020-12-16 LAB — T4, FREE: Free T4: 1.61 ng/dL — ABNORMAL HIGH (ref 0.61–1.12)

## 2020-12-16 MED ORDER — TORSEMIDE 40 MG PO TABS: 80.0000 mg | ORAL_TABLET | Freq: Two times a day (BID) | ORAL | 5 refills | Status: DC

## 2020-12-16 NOTE — Patient Instructions (Signed)
INCREASE Torsemide to 100 mg in the AM and 80 mg in the PM for 3 days, then resume normal dose of 80 mg twice a day STOP Metoprolol   Labs today We will only contact you if something comes back abnormal or we need to make some changes. Otherwise no news is good news!  Please wear your compression hose daily, place them on as soon as you get up in the morning and remove before you go to bed at night.  Your physician recommends that you schedule a follow-up appointment in: 2-3 weeks  in the Advanced Practitioners (PA/NP) [place] have been referred to [facility]  -they will call with appointment details    Do the following things EVERYDAY: 1) Weigh yourself in the morning before breakfast. Write it down and keep it in a log. 2) Take your medicines as prescribed 3) Eat low salt foods--Limit salt (sodium) to 2000 mg per day.  4) Stay as active as you can everyday 5) Limit all fluids for the day to less than 2 liters  At the [facility], you and your health needs are our priority. As part of our continuing mission to provide you with exceptional heart care, we have created designated Provider Care Teams. These Care Teams include your primary Cardiologist (physician) and Advanced Practice Providers (APPs- Physician Assistants and Nurse Practitioners) who all work together to provide you with the care you need, when you need it.   You may see any of the following providers on your designated Care Team at your next follow up: [name] Dr Glori Bickers . Dr [name] . Dr Vickki Muff . Darrick Grinder, NP . Lyda Jester, [place] . Audry Riles, PharmD   Please be sure to bring in all your medications bottles to every appointment.   If you have any questions or concerns before your next appointment please send [place] a message through [place] or call our office at [phone].    TO LEAVE A MESSAGE FOR THE NURSE SELECT OPTION 2, PLEASE  LEAVE A MESSAGE INCLUDING: . YOUR NAME . DATE OF BIRTH . CALL BACK NUMBER . REASON FOR CALL**this is important as we prioritize the call backs  YOU WILL RECEIVE A CALL BACK THE SAME DAY AS LONG AS YOU CALL BEFORE 4:00 PM  Please see our updated No Show and Same Day Appointment Cancellation Policy attached to your AVS.

## 2020-12-16 NOTE — Progress Notes (Signed)
PCP: Dr Raoul Pitch EP: Dr Lovena Le Cardiology: Dr Tamala Julian  Primary HF Cardiologist: Dr Aundra Dubin   HPI: Todd Romero is a 87 year old with a history of chronic combined systolic and diastolic heart failure, last echo 1/21 showed reduced EF 35 to 40% with moderate LVH. RV normal. Also history of severe aortic stenosis status post TAVR in2018, symptomatic bradycardia/RBBBstatus post Medtronic permanent pacemaker implanted 10/2019, CAD, PAF, insulin-dependent diabetes, stage IVCKD, severe OSAintolerant toCPAP, hypertension, as well as history of carpal tunnel status post bilateralcarpal tunnel release.  Presented to the ED on 1/26. Markedly fluid overloaded.Started on IV lasix + empiric milrinone.   His device was interrogated and showed he had been in persistent atrial fibrillation since 09/06/20 w/ 34% RV pacing. Underwent TEE guided DCCV back to NSR. TEE (2/1) with EF 25-30%, mildly decreased RV systolic function, normal function of TAVR valve. Hospital course complicated by AKI/urosepsis. Work up for amyloid was negative. Myeloma panel,urine IFXN, and PYP were negative. Will need followup with EP =>discussed with Dr. Quentin Ore, suspect he will need CRT with high percentage (>30%) RV pacing and a-pacing with very long PR interval. Discharged to SNF, [name]. Discharge weight was 223 pounds.   He was sent to [name] 10/19/20 for anxiety/tremors. CT of head was negative for hemorrhage. He was sent back to SNF.  Creatinine 1.8  And K 5.1.   On 3/18 he was referred to [facility] for volume overload. Overall he received 6 doses of IV lasix over the 4 days.   He was seen in clinic 3/22 and he remained significanly volume overloaded. Was referred back to [facility] for additional IV Lasix. Volume status improved.   Underwent device upgrade 12/01/20 CRT-P by Dr. Lovena Le.   Was elevated in the ED last week, 4/9, for fall. He stood up and got dizzy, fell and hit his head on the fireplace.  4cm lac to top of head, repaired w/ stables. Head CT negative for bleed/ fracture. Labs also showed AKI, SCr up from 2.4>>2.7, BUN was elevated at 92. UA showed trace leukocytes and rare bacteria. No abx prescribed. K was also low at 2.9 and supplemented. Was instructed to f/u in the [name].   He returns to [facility] for f/u. Here w/ wife. In wheel chair. Has continued to feel bad. Little engergy. Complains of weakness and fatigue. Denies palpitations and CP. No further falls. Denies dysuria. Wife notes intermittent confusion, but c/w baseline. He continues w/ positional dizziness and mildly orthostatic. SBP 136>>116 from sitting to standing in clinic today. EKG shows A-V dual paced rhythm. Sinus. HR 70 bpm. He is chronically  NYHA Class III w/ 1-2+ bilateral LEE on exam. Due to recent device upgrade, HF diagnostics not yet available on device interpretation.    Cardiac Studies Echo 10/03/20 EF 30-35% RV low normal , LA/RA severely dilated. AVR. (TAVR)  Echo 09/13/2019 EF 35-40%  Grade II DD. RV normal  Echo 2019 EF 43%   PYP 10/2020 not suggestive TTR.   Labs  10/21/20 K 5.1 Creatinine 1.8  10/14/20 K 3.6 Creatinine 2.25  11/24/20: Creatinine 2.08 3/22: creatinine. 2.35 3.8  4/22: creatinine 2.65, K 2.9    ROS: All systems negative except as listed in HPI, PMH and Problem List.  SH:  Social History   Socioeconomic History  . Marital status: Married    Spouse name: Not on file  . Number of children: 4  . Years of education: Not on file  . Highest education level: Not  on file  Occupational History  . Occupation: Construction-Retired  Tobacco Use  . Smoking status: Never Smoker  . Smokeless tobacco: Never Used  Vaping Use  . Vaping Use: Never used  Substance and Sexual Activity  . Alcohol use: Not Currently  . Drug use: Not Currently  . Sexual activity: Not Currently    Partners: Female  Other Topics Concern  . 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 Heart failure Father   . Emphysema Father   . COPD Father   . Early death Father   . Heart attack Father   . Hypertension Mother   . Early death Mother   . Hyperlipidemia Mother   . Stroke Mother   . Cancer Sister   . Hypertension Sister   . Healthy Sister     Past Medical History:  Diagnosis Date  . AKI (acute kidney injury) ([name]) 10/24/2019  . Anemia   . Arthritis   . CAD (coronary artery disease)    a. cardiac cath 12/2016 showing moderate AS, elevated LVEDP, heavy 3V coronary calcification, 100% mD2, 30-50% LAD, 50-90% stenosis of Cx proximal to origin of L-PDA, RCA not engaged due to poor catheter control (difficult procedure) - coronary status was essentially unchanged from prior.  . Chicken pox   . Chronic diastolic CHF (congestive heart failure) ([name])   . Colon polyps   . Degenerative arthritis   . Diabetes ([name])   . Diabetes mellitus without complication ([name])   . Dyspnea   . Dyspnea on exertion 02/11/2015  . Essential hypertension   . Heart disease   . Heme positive stool 11/16/2017  . Hyperlipidemia   . Intermediate stage nonexudative age-related macular degeneration of both eyes 02/27/2015  . Leukocytosis 11/16/2017  . New onset a-fib ([name]) 03/04/2018  . Obesity (BMI 30-39.9) 06/18/2015  . OSA (obstructive sleep apnea)    Severe with AHI 27/hr now on CPAP  not compliant with treatment.  . Peritonitis ([place]) 1985?  [name] Pneumonia    "6  months - 87 years old"  . Posterior vitreous detachment, left 03/02/2016  . Presence of permanent cardiac pacemaker   . Pseudophakia of both eyes 03/02/2016  . Pure hypercholesterolemia   . RBBB   . [facility] spotted fever   . 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 Severe aortic stenosis    a. s/p TAVR 03/2017.  [name] Symptomatic bradycardia 10/24/2019  . Type II or unspecified type diabetes mellitus without mention of complication, not stated as uncontrolled   . Varicose vein of leg     Current Outpatient Medications  Medication Sig Dispense Refill  . ACCU-CHEK AVIVA PLUS test strip USE AS INSTRUCTED TO CHECK BLOOD SUGAR TWICE A DAY DX:E11.65 100  strip 2  . acetaminophen (TYLENOL) 500 MG tablet Take 500 mg by mouth every 4 (four) hours as needed for moderate pain or headache.    [name] amiodarone (PACERONE) 200 MG tablet Take 1 tablet (200 mg total) by mouth daily. 30 tablet 5  . apixaban (ELIQUIS) 2.5 MG TABS tablet Take 1 tablet (2.5 mg total) by mouth 2 (two) times daily. 180 tablet 1  . B-D UF III MINI PEN NEEDLES 31G X 5 MM MISC USE 2 PEN NEEDLE PER DAY WITH HUMALOG AND VICTOZA 200 each 3  . BD INSULIN SYRINGE U/F 31G X 5/16" 1 ML MISC USE TO INJECT INSULIN DAILY. DX:E11.9 100 each 1  . ferrous sulfate 325 (65 FE) MG tablet Take 325 mg by mouth 2 (two) times daily with a meal.     . glimepiride (AMARYL) 2 MG tablet TAKE 1 TABLET DAILY BEFORE BREAKFAST 90 tablet 3  . HUMALOG KWIKPEN 100 UNIT/ML KwikPen INJECT 12 UNITS INTO THE SKIN 2 (TWO) TIMES DAILY BEFORE LUNCH AND SUPPER. INJECT 12 UNITS UNDER SKIN BEFORE LUNCH AND DINNER. MAY ALSO INJECT 4 UNIT AT BREAKFAST IS SUGAR READING IS OVER 100. 15 mL 1  . insulin NPH Human (HUMULIN N) 100 UNIT/ML injection Inject 26 units of Humulin N under the skin once daily at bedtime. (Patient taking differently: Inject 26 units of  Humulin N under the skin once daily at bedtime.) 90 mL 1  . liraglutide (VICTOZA) 18 MG/3ML SOPN Inject 1.8 mg daily 27 mL 3  . metolazone (ZAROXOLYN) 5 MG tablet Take 1 tablet (5 mg total) by mouth daily. (Patient not taking: Reported on 12/11/2020) 15 tablet 1  . metoprolol succinate (TOPROL-XL) 25 MG 24 hr tablet Take 25 mg by mouth daily.    . Multiple Vitamin (MULTIVITAMIN WITH MINERALS) TABS tablet Take 1 tablet by mouth daily. Mens One a day Vit    . Multiple Vitamins-Minerals (PRESERVISION AREDS 2 PO) Take 1 capsule by mouth 2 (two) times daily.    . pantoprazole (PROTONIX) 40 MG tablet Take 1 tablet (40 mg total) by mouth 2 (two) times daily. 180 tablet 3  . potassium chloride SA (KLOR-CON M20) 20 MEQ tablet Take when taking dose of Metolazone 90 tablet 3  . pravastatin (PRAVACHOL) 80 MG tablet Take 1 tablet (80 mg total) by mouth every evening. 30 tablet 0  . tamsulosin (FLOMAX) 0.4 MG CAPS capsule Take 1 capsule daily 90 capsule 1  . Torsemide 40 MG TABS Take 80 mg by mouth 2 (two) times daily. 120 tablet 5   No current facility-administered medications for this visit.    There were no vitals filed for this visit. Wt Readings from Last 3 Encounters:  12/13/20 97.1 kg  12/01/20 102 kg  11/28/20 103 kg   PHYSICAL EXAM: General:  Well appearing, elderly WM in wheel chair No respiratory difficulty HEENT: normal, +left scalp laceration stabled, healing well, no drainage   Neck: supple. JVD 9 cm. Carotids 2+ bilat; no bruits. No lymphadenopathy or thyromegaly appreciated. Cor: PMI nondisplaced. Regular rate & rhythm. No rubs, gallops or murmurs. Lungs: clear Abdomen: soft, nontender, nondistended. No hepatosplenomegaly. No bruits or masses. Good bowel sounds. Extremities: no cyanosis, clubbing, rash, 1-2+ bilateral LEE edema Neuro: alert & oriented x 3, cranial nerves grossly intact. moves all 4 extremities w/o difficulty. Affect pleasant.    ASSESSMENT & PLAN: 1. Combined  Systolic/Diastolic Heart Failure  Etiology uncertain, last cath in 2018 pre-TAVR with occluded D2 and 90% stenosis left PDA.  He had MDT PPM for symptomatic bradycardia in 2/21 (after last echo). He is RV pacing 34% of the time on device interrogation this admission. Additionally, he has been in atrial fibrillation since the beginning of January. Echo  10/03/20 EF 30-35% RV mildly reduced TAVR ok.  TEE (2/1) with EF 25-30%, mildly decreased RV systolic function, normal function of TAVR valve. TEE-DCCV 2/1 with conversion to NSR but then back to atrial fibrillation>>converted back to NSR on amio gtt. PYP not suggestive of TTR. Underwent device upgrade to CRT-P 12/01/20. NYHA III. Fluid overloaded on exam today w/ 1-2+ bilateral LEE.  - Increase Torsemide to 100 mg qam/80 mg qpm x 2 days>>back to 80 mg bid therafter - Rx given for medical grade compression stockings to help w/ edema and orthostatic hypotension  - Stop Toprol given orthostatic hypotension and fatigue  - Check BMP today  - Not on arni, mra/ digoxin with CKDI Stage IV - He did not tolerarte farxiga   2. PAF - TEE-DCCV on 2/1 initially converted him to NSR but back to atrial fibrillation soon after.  Converted back to NSR on 2/2 with amiodarone - AV paced on EKG today, HR 70s  - Continue amio 200 mg daily - Continue Eliquis 2.5 mg bid for now but concern given frequent falls. Had fall in Dec and another last week w/ scalp laceration, repaired in ED. Head CT negative for bleed. Will refer back to EP for revaluation for possible Watchman device   3. CAD - Cath in 2018 pre-TAVR with occluded D2 and 90% stenosis left PDA.  - stable w/o CP  - no ASA due to Eliquis - on statin   4. CKD Stager IV - last SCr up to 2.7  - Repeat BMP today - will refer to nephrology   5. Bradycardia --> PPM Medtronic  - now w/ CRT-P device, followed by Dr. Lovena Le   6. ? UTI - UA in ED 4/9 showed showed trace leukocytes and rare bacteria, no abx  given - now complains of increasing fatigue, intermittent confusion and orthostatic hypotension - repeat UA + UC, check CBC and CMP   Keep f/u w/ Dr. Aundra Dubin next week    Lyda Jester PA-C  8:30 AM

## 2020-12-17 ENCOUNTER — Encounter (HOSPITAL_COMMUNITY): Payer: Self-pay | Admitting: Internal Medicine

## 2020-12-17 LAB — T3, FREE: T3, Free: 2.1 pg/mL (ref 2.0–4.4)

## 2020-12-18 ENCOUNTER — Other Ambulatory Visit: Payer: Self-pay

## 2020-12-18 ENCOUNTER — Ambulatory Visit (INDEPENDENT_AMBULATORY_CARE_PROVIDER_SITE_OTHER): Payer: Medicare Other | Admitting: Family Medicine

## 2020-12-18 ENCOUNTER — Encounter: Payer: Self-pay | Admitting: Family Medicine

## 2020-12-18 VITALS — BP 116/70 | HR 79 | Temp 98.1°F | Ht 67.0 in | Wt 224.0 lb

## 2020-12-18 DIAGNOSIS — Z9181 History of falling: Secondary | ICD-10-CM | POA: Diagnosis not present

## 2020-12-18 DIAGNOSIS — S0101XA Laceration without foreign body of scalp, initial encounter: Secondary | ICD-10-CM

## 2020-12-18 DIAGNOSIS — Z5189 Encounter for other specified aftercare: Secondary | ICD-10-CM

## 2020-12-18 NOTE — Progress Notes (Signed)
This visit occurred during the SARS-CoV-2 public health emergency.  Safety protocols were in place, including screening questions prior to the visit, additional usage of staff PPE, and extensive cleaning of exam room while observing appropriate contact time as indicated for disinfecting solutions.    Todd Romero , 1933-10-05, 87 y.o., male MRN: [id number] Patient Care Team    Relationship Specialty Notifications Start End  Ma Hillock, DO PCP - General Family Medicine  12/13/20    Comment: Clinton Gallant, Lynnell Dike, MD PCP - Cardiology Cardiology  10/21/17    Comment: Donnamarie Rossetti, MD Consulting Physician Endocrinology  09/25/19   Thompson Grayer, MD Consulting Physician Cardiology  10/01/19   Danice Goltz, MD Consulting Physician Ophthalmology  10/01/19   Monna Fam, MD Consulting Physician Ophthalmology  10/01/19   Carol Ada, MD Consulting Physician Gastroenterology  10/01/19   Evans Lance, MD Consulting Physician Cardiology  11/08/19   Ma Hillock, DO  Family Medicine  12/13/20    Comment: Merged    Chief Complaint  Patient presents with  . Staple removal     Subjective: Pt presents for an OV with his wife after ED visit for fall and head laceration.  Patient underwent work-up in the ED along with CT scan of head since he is 87 year old male on blood thinner.  Work-up was essentially normal CT was unremarkable for acute injury.  Patient's head laceration was closed with 3 staples.  Depression screen PHQ 2/9 11/06/2020  Decreased Interest 0  Down, Depressed, Hopeless 0  PHQ - 2 Score 0  Some recent data might be hidden    Allergies  Allergen Reactions  . Ambien [Zolpidem Tartrate] Other (See Comments)    Severe agitation   . Other Other (See Comments)  . Bactrim [Sulfamethoxazole-Trimethoprim] Nausea And Vomiting and Other (See Comments)    Bleeding and ulcers  . Bactrim [Sulfamethoxazole-Trimethoprim]     Bleeding  . Morphine And  Related     hypoxia  . 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 AKI (acute kidney injury) ([place]) 10/24/2019  . Anemia   . Arthritis   . CAD (coronary artery disease)    a. cardiac cath 12/2016 showing moderate AS, elevated LVEDP, heavy 3V coronary calcification, 100% mD2, 30-50% LAD, 50-90% stenosis of Cx proximal to origin of L-PDA, RCA not engaged due to poor catheter control (difficult procedure) - coronary status was essentially unchanged from prior.  . Chicken pox   . Chronic diastolic CHF (congestive heart failure) ([place])   . Colon polyps   . Degenerative arthritis   . Diabetes ([place])   . Diabetes mellitus without complication ([place])   . Dyspnea   . Dyspnea on exertion 02/11/2015  . Essential hypertension   . Heart disease   . Heme positive stool 11/16/2017  . Hyperlipidemia   . Intermediate stage nonexudative age-related macular degeneration of both eyes 02/27/2015  . Leukocytosis 11/16/2017  . New onset a-fib ([place]) 03/04/2018  . Obesity (BMI 30-39.9) 06/18/2015  . OSA (obstructive sleep apnea)    Severe with AHI 27/hr now  on CPAP  not compliant with treatment.  . Peritonitis ([place]) 1985?  [name] Pneumonia    "6 months - 87 years old"  . Posterior vitreous detachment, left 03/02/2016  . Presence of permanent cardiac pacemaker   . Pseudophakia of both eyes 03/02/2016  . Pure hypercholesterolemia   . RBBB   . [facility] spotted fever   . 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 Severe aortic stenosis    a. s/p TAVR 03/2017.  [name]  Symptomatic bradycardia 10/24/2019  . Type II or unspecified type diabetes mellitus without mention of complication, not stated as uncontrolled   . Varicose vein of leg    Past Surgical History:  Procedure Laterality Date  . APPENDECTOMY  1985   peritonitis  . BIV UPGRADE N/A 12/01/2020   Procedure: BIV UPGRADE;  Surgeon: Evans Lance, MD;  Location: [facility];  Service: Cardiovascular;  Laterality: N/A;  . CARDIAC SURGERY    . CARDIOVERSION N/A 04/03/2018   Procedure: CARDIOVERSION;  Surgeon: Josue Hector, MD;  Location: [facility] ENDOSCOPY;  Service: Cardiovascular;  Laterality: N/A;  . CARDIOVERSION N/A 01/18/2019   Procedure: CARDIOVERSION;  Surgeon: Skeet Latch, MD;  Location: [place];  Service: Cardiovascular;  Laterality: N/A;  . CARDIOVERSION N/A 10/07/2020   Procedure: CARDIOVERSION;  Surgeon: Larey Dresser, MD;  Location: [facility] ENDOSCOPY;  Service: Cardiovascular;  Laterality: N/A;  . CARPAL TUNNEL RELEASE    . COLONOSCOPY WITH PROPOFOL N/A 11/18/2017   Procedure: COLONOSCOPY WITH PROPOFOL;  Surgeon: Carol Ada, MD;  Location: [facility] ENDOSCOPY;  Service: Endoscopy;  Laterality: N/A;  . ESOPHAGOGASTRODUODENOSCOPY N/A 11/18/2017   Procedure: ESOPHAGOGASTRODUODENOSCOPY (EGD);  Surgeon: Carol Ada, MD;  Location: [facility] ENDOS[facility];  Service: Endoscopy;  Laterality: N/A;  . EYE SURGERY Bilateral    cataracts removed, lens placed  . JOINT REPLACEMENT    . KNEE CARTILAGE SURGERY    . KNEE SURGERY Right   . PACEMAKER IMPLANT N/A 10/25/2019   Procedure: PACEMAKER IMPLANT;  Surgeon: Evans Lance, MD;  Location: [place] [facility];  Service: Cardiovascular;  Laterality: N/A;  . PACEMAKER IMPLANT    . PARTIAL COLECTOMY    . RIGHT/LEFT HEART CATH AND CORONARY ANGIOGRAPHY N/A 12/28/2016   Procedure: Right/Left Heart Cath and Coronary Angiography;  Surgeon: Belva Crome, MD;  Location: [facility];  Service: Cardiovascular;  Laterality: N/A;  . TEE WITHOUT CARDIOVERSION  N/A 03/15/2017   Procedure: TRANSESOPHAGEAL ECHOCARDIOGRAM (TEE);  Surgeon: Burnell Blanks, MD;  Location: [place];  Service: Open Heart Surgery;  Laterality: N/A;  . TEE WITHOUT CARDIOVERSION N/A 10/07/2020   Procedure: TRANSESOPHAGEAL ECHOCARDIOGRAM (TEE);  Surgeon: Larey Dresser, MD;  Location: [facility] ENDOSCOPY;  Service: Cardiovascular;  Laterality: N/A;  . TONSILLECTOMY AND ADENOIDECTOMY  1942  . TOTAL KNEE ARTHROPLASTY Right 11/08/2017  . TOTAL KNEE ARTHROPLASTY Right 11/08/2017   Procedure: TOTAL KNEE ARTHROPLASTY;  Surgeon: Melrose Nakayama, MD;  Location: [facility];  Service: Orthopedics;  Laterality: Right;  . TRANSCATHETER AORTIC VALVE REPLACEMENT, TRANSFEMORAL N/A 03/15/2017   Procedure: TRANSCATHETER AORTIC VALVE REPLACEMENT, TRANSFEMORAL;  Surgeon: Burnell Blanks, MD;  Location: [place];  Service: Open Heart Surgery;  Laterality: N/A;  . VEIN SURGERY  1980   Family History  Problem Relation Age of Onset  . Heart failure Father   . Emphysema Father   . COPD Father   . Early death  Father   . Heart attack Father   . Hypertension Mother   . Early death Mother   . Hyperlipidemia Mother   . Stroke Mother   . Cancer Sister   . Hypertension Sister   . Healthy Sister    Allergies as of 12/18/2020      Reactions   Ambien [zolpidem Tartrate] Other (See Comments)   Severe agitation    Other Other (See Comments)   Bactrim [sulfamethoxazole-trimethoprim] Nausea And Vomiting, Other (See Comments)   Bleeding and ulcers   Bactrim [sulfamethoxazole-trimethoprim]    Bleeding   Morphine And Related    hypoxia   Morphine And Related Nausea And Vomiting      Medication List       Accurate as of December 18, 2020 12:58 PM. If you have any questions, ask your nurse or doctor.        Accu-Chek Aviva Plus test strip Generic drug: glucose blood USE AS INSTRUCTED TO CHECK BLOOD SUGAR TWICE A DAY DX:E11.65   acetaminophen 500 MG tablet Commonly known as: TYLENOL Take 500 mg by  mouth every 4 (four) hours as needed for moderate pain or headache.   amiodarone 200 MG tablet Commonly known as: PACERONE Take 1 tablet (200 mg total) by mouth daily.   apixaban 2.5 MG Tabs tablet Commonly known as: Eliquis Take 1 tablet (2.5 mg total) by mouth 2 (two) times daily.   B-D UF III MINI PEN NEEDLES 31G X 5 MM Misc Generic drug: Insulin Pen Needle USE 2 PEN NEEDLE PER DAY WITH HUMALOG AND VICTOZA   BD Insulin Syringe U/F 31G X 5/16" 1 ML Misc Generic drug: Insulin Syringe-Needle U-100 USE TO INJECT INSULIN DAILY. DX:E11.9   ferrous sulfate 325 (65 FE) MG tablet Take 325 mg by mouth 2 (two) times daily with a meal.   glimepiride 2 MG tablet Commonly known as: AMARYL TAKE 1 TABLET DAILY BEFORE BREAKFAST   HumaLOG KwikPen 100 UNIT/ML KwikPen Generic drug: insulin lispro INJECT 12 UNITS INTO THE SKIN 2 (TWO) TIMES DAILY BEFORE LUNCH AND SUPPER. INJECT 12 UNITS UNDER SKIN BEFORE LUNCH AND DINNER. MAY ALSO INJECT 4 UNIT AT BREAKFAST IS SUGAR READING IS OVER 100.   insulin NPH Human 100 UNIT/ML injection Commonly known as: HumuLIN N Inject 26 units of Humulin N under the skin once daily at bedtime.   multivitamin with minerals Tabs tablet Take 1 tablet by mouth daily. Mens One a day Vit   pantoprazole 40 MG tablet Commonly known as: PROTONIX Take 1 tablet (40 mg total) by mouth 2 (two) times daily.   pravastatin 80 MG tablet Commonly known as: PRAVACHOL Take 1 tablet (80 mg total) by mouth every evening.   PRESERVISION AREDS 2 PO Take 1 capsule by mouth 2 (two) times daily.   tamsulosin 0.4 MG Caps capsule Commonly known as: FLOMAX Take 1 capsule daily   torsemide 20 MG tablet Commonly known as: DEMADEX Take 80 mg by mouth 2 (two) times daily.   Torsemide 40 MG Tabs Take 80 mg by mouth 2 (two) times daily.   Victoza 18 MG/3ML Sopn Generic drug: liraglutide Inject 1.8 mg daily       All past medical history, surgical history, allergies, family  history, immunizations andmedications were updated in the EMR today and reviewed under the history and medication portions of their EMR.     ROS: Negative, with the exception of above mentioned in HPI   Objective:  BP 116/70   Pulse 79  Temp 98.1 F (36.7 C) (Oral)   Ht 5\' 7"  (1.702 m)   Wt 224 lb (101.6 kg)   SpO2 97%   BMI 35.08 kg/m  Body mass index is 35.08 kg/m. Gen: Afebrile. No acute distress. Nontoxic in appearance, well developed, well nourished.  HENT/scalp: [place]. ~4 cm well healing laceration top of scalp present. 3 staples in place w/ good approximation of wound. No erythema or drainage.  Eyes:Pupils Equal Round Reactive to light, Extraocular movements intact,  Conjunctiva without redness, discharge or icterus. Neuro:  Alert. Oriented x3  Psych: Normal affect, dress and demeanor. Normal speech. Normal thought content and judgment.  No exam data present No results found. No results found for this or any previous visit (from the past 24 hour(s)).  Assessment/Plan: Jakeim Sedore Adami is a 87 y.o. male present for OV for  Encounter for post-traumatic wound check/Scalp laceration, initial encounter Wound appears to be healing well, without any signs of bleeding or infection.  Staples have been in less than 7 days. Concern over early removal. Therefore, placement of steri-strips for added support today and then removal of 3 staples completed.  Patient tolerated suture removal.  No complications.  No bleeding.  They were provided with wound instructions and allowing Steri-Strips to fall off on their own.  If Steri-Strips are still in place 14 days after injury occurred, may soak with wet washcloth and then removed gently. Only needs to return next week if any concerns or wound not healing well.    Reviewed expectations re: course of current medical issues.  Discussed self-management of symptoms.  Outlined signs and symptoms indicating need for more acute  intervention.  Patient verbalized understanding and all questions were answered.  Patient received an After-Visit Summary.    No orders of the defined types were placed in this encounter.  No orders of the defined types were placed in this encounter.  Referral Orders  No referral(s) requested today     Note is dictated utilizing voice recognition software. Although note has been proof read prior to signing, occasional typographical errors still can be missed. If any questions arise, please do not hesitate to call for verification.   electronically signed by:  Howard Pouch, DO  [place]

## 2020-12-23 ENCOUNTER — Other Ambulatory Visit: Payer: Self-pay

## 2020-12-23 ENCOUNTER — Encounter (HOSPITAL_COMMUNITY): Payer: Self-pay | Admitting: Cardiology

## 2020-12-23 ENCOUNTER — Other Ambulatory Visit (HOSPITAL_COMMUNITY): Payer: Self-pay | Admitting: Cardiology

## 2020-12-23 ENCOUNTER — Ambulatory Visit (HOSPITAL_COMMUNITY)
Admission: RE | Admit: 2020-12-23 | Discharge: 2020-12-23 | Disposition: A | Discharge status: Home / Self Care | Payer: Medicare Other | Source: Ambulatory Visit | Attending: Cardiology | Admitting: Cardiology

## 2020-12-23 VITALS — BP 116/66 | HR 78 | Wt 228.0 lb

## 2020-12-23 DIAGNOSIS — Z7901 Long term (current) use of anticoagulants: Secondary | ICD-10-CM | POA: Diagnosis not present

## 2020-12-23 DIAGNOSIS — Z79899 Other long term (current) drug therapy: Secondary | ICD-10-CM | POA: Insufficient documentation

## 2020-12-23 DIAGNOSIS — E7849 Other hyperlipidemia: Secondary | ICD-10-CM | POA: Diagnosis not present

## 2020-12-23 DIAGNOSIS — E1122 Type 2 diabetes mellitus with diabetic chronic kidney disease: Secondary | ICD-10-CM | POA: Diagnosis not present

## 2020-12-23 DIAGNOSIS — I480 Paroxysmal atrial fibrillation: Secondary | ICD-10-CM

## 2020-12-23 DIAGNOSIS — N184 Chronic kidney disease, stage 4 (severe): Secondary | ICD-10-CM

## 2020-12-23 DIAGNOSIS — Z8249 Family history of ischemic heart disease and other diseases of the circulatory system: Secondary | ICD-10-CM | POA: Insufficient documentation

## 2020-12-23 DIAGNOSIS — I350 Nonrheumatic aortic (valve) stenosis: Secondary | ICD-10-CM | POA: Insufficient documentation

## 2020-12-23 DIAGNOSIS — Z794 Long term (current) use of insulin: Secondary | ICD-10-CM | POA: Insufficient documentation

## 2020-12-23 DIAGNOSIS — I5042 Chronic combined systolic (congestive) and diastolic (congestive) heart failure: Secondary | ICD-10-CM | POA: Diagnosis not present

## 2020-12-23 DIAGNOSIS — I2510 Atherosclerotic heart disease of native coronary artery without angina pectoris: Secondary | ICD-10-CM | POA: Diagnosis not present

## 2020-12-23 DIAGNOSIS — I130 Hypertensive heart and chronic kidney disease with heart failure and stage 1 through stage 4 chronic kidney disease, or unspecified chronic kidney disease: Secondary | ICD-10-CM | POA: Insufficient documentation

## 2020-12-23 DIAGNOSIS — G4733 Obstructive sleep apnea (adult) (pediatric): Secondary | ICD-10-CM | POA: Diagnosis not present

## 2020-12-23 DIAGNOSIS — E041 Nontoxic single thyroid nodule: Secondary | ICD-10-CM | POA: Diagnosis not present

## 2020-12-23 LAB — COMPREHENSIVE METABOLIC PANEL
ALT: 19 U/L (ref 0–44)
AST: 23 U/L (ref 15–41)
Albumin: 3.4 g/dL — ABNORMAL LOW (ref 3.5–5.0)
Alkaline Phosphatase: 81 U/L (ref 38–126)
Anion gap: 11 (ref 5–15)
BUN: 68 mg/dL — ABNORMAL HIGH (ref 8–23)
CO2: 29 mmol/L (ref 22–32)
Calcium: 9 mg/dL (ref 8.9–10.3)
Chloride: 94 mmol/L — ABNORMAL LOW (ref 98–111)
Creatinine, Ser: 2.36 mg/dL — ABNORMAL HIGH (ref 0.61–1.24)
GFR, Estimated: 26 mL/min — ABNORMAL LOW (ref >60)
Glucose, Bld: 131 mg/dL — ABNORMAL HIGH (ref 70–99)
Potassium: 3.5 mmol/L (ref 3.5–5.1)
Sodium: 134 mmol/L — ABNORMAL LOW (ref 135–145)
Total Bilirubin: 0.9 mg/dL (ref 0.3–1.2)
Total Protein: 6.9 g/dL (ref 6.5–8.1)

## 2020-12-23 LAB — CBC
HCT: 33.3 % — ABNORMAL LOW (ref 39.0–52.0)
Hemoglobin: 10.4 g/dL — ABNORMAL LOW (ref 13.0–17.0)
MCH: 29 pg (ref 26.0–34.0)
MCHC: 31.2 g/dL (ref 30.0–36.0)
MCV: 92.8 fL (ref 80.0–100.0)
Platelets: 183 10*3/uL (ref 150–400)
RBC: 3.59 MIL/uL — ABNORMAL LOW (ref 4.22–5.81)
RDW: 17.3 % — ABNORMAL HIGH (ref 11.5–15.5)
WBC: 7.9 10*3/uL (ref 4.0–10.5)
nRBC: 0 % (ref 0.0–0.2)

## 2020-12-23 LAB — LIPID PANEL
Cholesterol: 118 mg/dL (ref 0–200)
HDL: 57 mg/dL (ref >40)
LDL Cholesterol: 52 mg/dL (ref 0–99)
Total CHOL/HDL Ratio: 2.1 RATIO
Triglycerides: 47 mg/dL (ref <150)
VLDL: 9 mg/dL (ref 0–40)

## 2020-12-23 LAB — TSH: TSH: 0.862 u[IU]/mL (ref 0.350–4.500)

## 2020-12-23 MED ORDER — POTASSIUM CHLORIDE CRYS ER 20 MEQ PO TBCR: 20.0000 meq | EXTENDED_RELEASE_TABLET | ORAL | 3 refills | Status: DC

## 2020-12-23 MED ORDER — METOLAZONE 2.5 MG PO TABS
2.5000 mg | ORAL_TABLET | ORAL | 3 refills | Status: DC
Start: 2020-12-25 — End: 2021-01-13

## 2020-12-23 NOTE — Progress Notes (Signed)
PCP: Dr Raoul Pitch EP: Dr Lovena Le Cardiology: Dr Tamala Julian  Primary HF Cardiologist: Dr Aundra Dubin   HPI: Todd Romero is a 87 y.o. with a history of chronic systolic CHF, echo 4/08 showed reduced EF 35-40% with moderate LVH, RV normal. Also history of severe aortic stenosis status post TAVR in2018, symptomatic bradycardia/RBBBstatus post Medtronic permanent pacemaker implanted 10/2019, CAD, PAF, insulin-dependent diabetes, stage IVCKD, severe OSAintolerant toCPAP, hypertension, as well as history of carpal tunnel status post bilateralcarpal tunnel release.  Presented to the ED on 10/01/20. Markedly fluid overloaded.Started on IV lasix + empiric milrinone.   His device was interrogated and showed he had been in persistent atrial fibrillation since 09/06/20 w/ 34% RV pacing. Underwent TEE guided DCCV back to NSR. TEE (10/07/20) with EF 25-30%, mildly decreased RV systolic function, normal function of TAVR valve. Hospital course complicated by AKI/urosepsis. Work up for amyloid was negative. Myeloma panel, urine IFXN, and PYP were negative. Will need followup with EP =>discussed with Dr. Quentin Ore, suspect he needed CRT given high percentage (>30%) RV pacing and a-pacing with very long PR interval. Discharged to SNF, [facility]. Discharge weight was 223 pounds.   He was sent to [facility] 10/19/20 for anxiety/tremors. CT of head was negative for hemorrhage. He was sent back to SNF.  Creatinine 1.8  And K 5.1.   On 11/21/20 he was referred to [facility] for volume overload. Overall he received 6 doses of IV lasix over the 4 days.   He was seen in the clinic 3/22 and he remained volume overloaded. [facility] was instructed to give  80 mg IV lasix x1 + 3/23 &3/24 5 mg metolazone once  + 80 mg IV twice daily + 20 meq potassium.  On 12/01/20, he had upgrade to MDT CRT-P device.   Today he returns for HF followup with his wife. Weight is up 4 lbs.  He remains very limited.  He came in a wheelchair.   He fatigues easily, walks slowly with walker at home.  He sleeps in a recliner due to orthopnea.  No chest pain.  Able to walk around house without marked dyspnea but cannot get much farther.  He has had falls but not particularly frequent (about once a month). He is not lightheaded with standing.  No BRBPR/melena.    PMH: 1. Aortic stenosis: S/p TAVR in 2018.  2. Symptomatic bradycardia: S/p MDT PPM 2/21, upgraded to CRT-P.  3. Atrial fibrillation: Paroxysmal - DCCV 2/22 4. Type 2 diabetes 5. OSA: Cannot tolerate CPAP 6. CKD stage IV 7. HTN 8. Carpal tunnel syndrome s/p release.  9. Chronic systolic CHF: Mixed ischemia/nonischemic cardiomyopathy.  Has MDT CRT-P device.  - LHC (2018) with occluded PDA and 90% stenosis left PDA, treated medically.  - Echo (1/21): EF 35-40%, normal RV.  - Echo (1/22): EF 30-35%, severe biatrial enlargement, stable bioprosthetic aortic valve s/p TAVR, mildly decreased RV function.  - TEE (2/22): EF 25-30%, mildly decreased RV systolic function, normal function of TAVR valve - PYP scan (2/22): Not suggestive of TTR amyloidosis.  10. Hyperlipidemia  Labs (4/22): K 3.5, creatinine 2.63  ECG (personally reviewed): A-BiV dual paced  ROS: All systems negative except as listed in HPI, PMH and Problem List.  Social History   Socioeconomic History  . Marital status: Married    Spouse name: Not on file  . Number of children: 4  . Years of education: Not on file  . Highest education level: Not on file  Occupational  History  . Occupation: Construction-Retired  Tobacco Use  . Smoking status: Never Smoker  . Smokeless tobacco: Never Used  Vaping Use  . Vaping Use: Never used  Substance and Sexual Activity  . Alcohol use: Not Currently  . Drug use: Not Currently  . Sexual activity: Not Currently    Partners: Female  Other Topics Concern  . Not on file  Social History Narrative   ** Merged History Encounter **       Marital status/children/pets:  Married 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, retired Recruitment consultant:    -smoke alarm in the home:Yes   - wears seatbelt: Yes   - Feels safe in their relationships: Yes    Social Determinants of Radio broadcast assistant Strain: Not on file  Food Insecurity: Not on file  Transportation Needs: Not on file  Physical Activity: Not on file  Stress: Not on file  Social Connections: Not on file  Intimate Partner Violence: Not on file   Family History  Problem Relation Age of Onset  . Heart failure Father   . Emphysema Father   . COPD Father   . Early death Father   . Heart attack Father   . Hypertension Mother   . Early death Mother   . Hyperlipidemia Mother   . Stroke Mother   . Cancer Sister   . Hypertension Sister   . Healthy Sister     Current Outpatient Medications  Medication Sig Dispense Refill  . ACCU-CHEK AVIVA PLUS test strip USE AS INSTRUCTED TO CHECK BLOOD SUGAR TWICE A DAY DX:E11.65 100 strip 2  . acetaminophen (TYLENOL) 500 MG tablet Take 500 mg by mouth every 4 (four) hours as needed for moderate pain or headache.    [name] amiodarone (PACERONE) 200 MG tablet Take 1 tablet (200 mg total) by mouth daily. 30 tablet 5  . apixaban (ELIQUIS) 2.5 MG TABS tablet Take 1 tablet (2.5 mg total) by mouth 2 (two) times daily. 180 tablet 1  . B-D UF III MINI PEN NEEDLES 31G X 5 MM MISC USE 2 PEN NEEDLE PER DAY WITH HUMALOG AND VICTOZA 200 each 3  . BD INSULIN SYRINGE U/F 31G X 5/16" 1 ML MISC USE TO INJECT INSULIN DAILY. DX:E11.9 100 each 1  . ferrous sulfate 325 (65 FE) MG tablet Take 325 mg by mouth 2 (two) times daily with a meal.     . glimepiride (AMARYL) 2 MG tablet TAKE 1 TABLET DAILY BEFORE BREAKFAST 90 tablet 3  . HUMALOG KWIKPEN 100 UNIT/ML KwikPen INJECT 12 UNITS INTO THE SKIN 2 (TWO) TIMES DAILY BEFORE LUNCH AND SUPPER. INJECT 12 UNITS UNDER SKIN BEFORE LUNCH AND DINNER. MAY ALSO INJECT 4 UNIT AT BREAKFAST IS SUGAR READING IS OVER 100. 15 mL 1  . insulin NPH  Human (HUMULIN N) 100 UNIT/ML injection Inject 26 units of Humulin N under the skin once daily at bedtime. (Patient taking differently: Inject 26 units of Humulin N under the skin once daily at bedtime.) 90 mL 1  . liraglutide (VICTOZA) 18 MG/3ML SOPN Inject 1.8 mg daily 27 mL 3  . [START ON 12/25/2020] metolazone (ZAROXOLYN) 2.5 MG tablet Take 1 tablet (2.5 mg total) by mouth 2 (two) times a week. On Wednesdays and Saturdays 25 tablet 3  . Multiple Vitamin (MULTIVITAMIN WITH MINERALS) TABS tablet Take 1 tablet by mouth daily. Mens One a day Vit    . Multiple Vitamins-Minerals (PRESERVISION AREDS 2 PO) Take 1 capsule by mouth 2 (two) times  daily.    . pantoprazole (PROTONIX) 40 MG tablet Take 1 tablet (40 mg total) by mouth 2 (two) times daily. 180 tablet 3  . [START ON 12/25/2020] potassium chloride SA (KLOR-CON) 20 MEQ tablet Take 1 tablet (20 mEq total) by mouth 2 (two) times a week. On wednesdays and saturdays when you take the [facility]. 90 tablet 3  . pravastatin (PRAVACHOL) 80 MG tablet Take 1 tablet (80 mg total) by mouth every evening. 30 tablet 0  . tamsulosin (FLOMAX) 0.4 MG CAPS capsule Take 1 capsule daily 90 capsule 1  . torsemide (DEMADEX) 20 MG tablet Take 80 mg by mouth 2 (two) times daily.     No current facility-administered medications for this encounter.    Vitals:   12/23/20 0911 12/23/20 1015 12/23/20 1016  BP: 116/66    Pulse: 78    SpO2: 98% 98% 98%  Weight: 103.4 kg (228 lb)     Wt Readings from Last 3 Encounters:  12/23/20 103.4 kg (228 lb)  12/18/20 101.6 kg (224 lb)  12/16/20 101.7 kg (224 lb 3.2 oz)    PHYSICAL EXAM: General: NAD Neck: JVP 10-12 cm, no thyromegaly or thyroid nodule.  Lungs: Clear to auscultation bilaterally with normal respiratory effort. CV: Nondisplaced PMI.  Heart irregular S1/S2, no S3/S4, 1/6 SEM RUSB.  2+ edema to knees.  No carotid bruit.  Unable to palpate pedal pulses.  Abdomen: Soft, nontender, no hepatosplenomegaly, no  distention.  Skin: Intact without lesions or rashes.  Neurologic: Alert and oriented x 3.  Psych: Normal affect. Extremities: No clubbing or cyanosis.  HEENT: Normal.   ASSESSMENT & PLAN: 1. Chronic systolic CHF: Suspect mixed ischemic/nonischemic cardiomyopathy.  Atrial fibrillation and excessive RV pacing may contribute to cardiomyopathy. Last cath in 2018 pre-TAVR with occluded D2 and 90% stenosis left PDA. He had MDT PPM for symptomatic bradycardia in 2/21, upgraded to CRT-P in 3/22 due to high percentage of RV pacing.  TEE (2/22) with EF 25-30%, mildly decreased RV systolic function, normal function of TAVR valve.  PYP scan not suggestive of TTR cardiac amyloidosis.  He required milrinone during 2/22 admission.  CHF is complicated by CKD stage IV.  He is markedly volume overloaded on exam today, NYHA class III symptoms.  - Continue torsemide 80 mg bid.  - Add metolazone 2.5 mg twice a week with am torsemide on Wednesday and Saturday.  Take an extra 20 mEq KCl on metolazone days. BMET today and in 10 days.   - No ARNI, spironolactone with CKD stage IV.  - He did not tolerate [place] - Avoiding beta blocker with marked volume overload and low output HF requiring milrinone transiently at last admission.  - Not candidate for advanced therapies with age and CKD stage IV.  2. Atrial fibrillation: Paroxysmal. TEE-DCCV on 10/07/20 initially converted him to NSR but back to atrial fibrillation soon after.  Converted back to NSR on 10/08/20 with amiodarone. He is in NSR today.  - Continue amio 200 mg daily.  Check LFTs and TSH today, will need regular eye exam.  - Continue Eliquis 2.5 mg bid. There has been consideration for [place], but he is not falling frequently and does not have GI bleeding history.  Given CKD and uncontrolled CHF, would avoid the additional procedure.  3. CAD: Cath in 2018 pre-TAVR with occluded D2 and 90% stenosis left PDA, medically managed.  No chest pain.  - On Eliquis so no  aspirin.  - Continue pravastatin, check lipids.  4. CKD  Stage IV - BMET today.  - Make sure he has a nephrology referral.  5. Bradycardia --> PPM Medtronic --> upgraded to CRT-P on 12/01/20.   Followup in 3 wks with NP/PA.   [name] 12/23/2020

## 2020-12-23 NOTE — Patient Instructions (Addendum)
EKG done today.  Labs done today. We will contact you only if your labs are abnormal.  START Metalozone 2.5mg  (1 tablet) by mouth only on wednesdays and Thursdays.   START Potassium 63meq(1 tablet) by mouth only on wednesdays and Thursdays when you take the [place].   No other medication changes were made. Please continue all current medications as prescribed.  Your physician recommends that you schedule a follow-up appointment in: 10 days for a lab only appointment and in 3 weeks for an appointment here in our office.  If you have any questions or concerns before your next appointment please send [place] a message through [place] or call our office at [phone].    TO LEAVE A MESSAGE FOR THE NURSE SELECT OPTION 2, PLEASE LEAVE A MESSAGE INCLUDING: . YOUR NAME . DATE OF BIRTH . CALL BACK NUMBER . REASON FOR CALL**this is important as we prioritize the call backs  YOU WILL RECEIVE A CALL BACK THE SAME DAY AS LONG AS YOU CALL BEFORE 4:00 PM   Do the following things EVERYDAY: 1) Weigh yourself in the morning before breakfast. Write it down and keep it in a log. 2) Take your medicines as prescribed 3) Eat low salt foods--Limit salt (sodium) to 2000 mg per day.  4) Stay as active as you can everyday 5) Limit all fluids for the day to less than 2 liters   At the [facility], you and your health needs are our priority. As part of our continuing mission to provide you with exceptional heart care, we have created designated Provider Care Teams. These Care Teams include your primary Cardiologist (physician) and Advanced Practice Providers (APPs- Physician Assistants and Nurse Practitioners) who all work together to provide you with the care you need, when you need it.   You may see any of the following providers on your designated Care Team at your next follow up: [name] Dr Glori Bickers . Dr [name] . Darrick Grinder, NP . Lyda Jester, PA . Audry Riles,  PharmD   Please be sure to bring in all your medications bottles to every appointment.

## 2020-12-26 ENCOUNTER — Telehealth: Payer: Self-pay | Admitting: Internal Medicine

## 2020-12-26 ENCOUNTER — Telehealth (HOSPITAL_COMMUNITY): Payer: Self-pay | Admitting: Cardiology

## 2020-12-26 NOTE — Telephone Encounter (Signed)
Returned call to family.  Advised our claims team received the denial and they will submit additional information for appeal.  Thanked nurse for call back.

## 2020-12-26 NOTE — Telephone Encounter (Signed)
nephrology referral  Faxed to [facility] confirmation received

## 2020-12-26 NOTE — Telephone Encounter (Signed)
Patient's wife states patient's 3 lead PPM procedure on 12/01/20 is not covered by [facility]. She states they denied covering it and she would like to know what needs to be done for an appeal. Please advise.

## 2020-12-30 ENCOUNTER — Other Ambulatory Visit: Payer: Self-pay | Admitting: Interventional Cardiology

## 2021-01-02 ENCOUNTER — Ambulatory Visit (HOSPITAL_COMMUNITY)
Admission: RE | Admit: 2021-01-02 | Discharge: 2021-01-02 | Disposition: A | Discharge status: Home / Self Care | Payer: Medicare Other | Source: Ambulatory Visit | Attending: Cardiology | Admitting: Cardiology

## 2021-01-02 ENCOUNTER — Other Ambulatory Visit: Payer: Self-pay

## 2021-01-02 DIAGNOSIS — I5042 Chronic combined systolic (congestive) and diastolic (congestive) heart failure: Secondary | ICD-10-CM | POA: Diagnosis present

## 2021-01-02 LAB — BASIC METABOLIC PANEL
Anion gap: 12 (ref 5–15)
BUN: 80 mg/dL — ABNORMAL HIGH (ref 8–23)
CO2: 30 mmol/L (ref 22–32)
Calcium: 9 mg/dL (ref 8.9–10.3)
Chloride: 91 mmol/L — ABNORMAL LOW (ref 98–111)
Creatinine, Ser: 2.48 mg/dL — ABNORMAL HIGH (ref 0.61–1.24)
GFR, Estimated: 25 mL/min — ABNORMAL LOW (ref >60)
Glucose, Bld: 194 mg/dL — ABNORMAL HIGH (ref 70–99)
Potassium: 2.8 mmol/L — ABNORMAL LOW (ref 3.5–5.1)
Sodium: 133 mmol/L — ABNORMAL LOW (ref 135–145)

## 2021-01-05 ENCOUNTER — Other Ambulatory Visit: Payer: Medicare Other

## 2021-01-07 ENCOUNTER — Telehealth: Payer: Self-pay

## 2021-01-07 MED ORDER — ONDANSETRON HCL 4 MG PO TABS: 4.0000 mg | ORAL_TABLET | Freq: Three times a day (TID) | ORAL | 2 refills | Status: DC | PRN

## 2021-01-07 NOTE — Telephone Encounter (Signed)
Pt's wife called because pt is on potassium Rx from cardiology and pt is very weak and is not eating very well. She states she think it is because of Rx. Wife states she called cardiology dr and they suggested she call PCP to get Rx for nausea. She does not feel like she can get him here because of his condition.  Please advise if VV is okay or if something can be sent to pharmacy. Also what can be done in mean time if VV/OV is needed.

## 2021-01-07 NOTE — Telephone Encounter (Signed)
LVM for pt to CB regarding prior conversation

## 2021-01-07 NOTE — Telephone Encounter (Signed)
I have called in Zofran.  It is an antinausea medicine that can be taken every 8 hours.  Hopefully this will help with his nausea and allow him to hydrate and eat.  If he becomes more weak, he will need to be evaluated on cause and blood work completed.  I would encourage him to closely monitor his blood sugars, especially if not eating well.  If blood sugars are too low or high, call his endocrine for further instructions.

## 2021-01-08 ENCOUNTER — Other Ambulatory Visit: Payer: Self-pay

## 2021-01-08 ENCOUNTER — Telehealth: Payer: Medicare Other | Admitting: Endocrinology

## 2021-01-08 NOTE — Telephone Encounter (Signed)
Spoke with pt spoke regarding medication and instructions.

## 2021-01-13 ENCOUNTER — Other Ambulatory Visit (HOSPITAL_COMMUNITY): Payer: Self-pay | Admitting: Cardiology

## 2021-01-13 ENCOUNTER — Telehealth (HOSPITAL_COMMUNITY): Payer: Self-pay | Admitting: Licensed Clinical Social Worker

## 2021-01-13 ENCOUNTER — Encounter (HOSPITAL_COMMUNITY): Payer: Self-pay | Admitting: Cardiology

## 2021-01-13 ENCOUNTER — Other Ambulatory Visit: Payer: Self-pay

## 2021-01-13 ENCOUNTER — Ambulatory Visit (HOSPITAL_COMMUNITY)
Admission: RE | Admit: 2021-01-13 | Discharge: 2021-01-13 | Disposition: A | Discharge status: Home / Self Care | Payer: Medicare Other | Source: Ambulatory Visit | Attending: Cardiology | Admitting: Cardiology

## 2021-01-13 VITALS — BP 120/68 | HR 69 | Wt 226.4 lb

## 2021-01-13 DIAGNOSIS — N184 Chronic kidney disease, stage 4 (severe): Secondary | ICD-10-CM | POA: Diagnosis not present

## 2021-01-13 DIAGNOSIS — Z7901 Long term (current) use of anticoagulants: Secondary | ICD-10-CM | POA: Insufficient documentation

## 2021-01-13 DIAGNOSIS — Z8249 Family history of ischemic heart disease and other diseases of the circulatory system: Secondary | ICD-10-CM | POA: Diagnosis not present

## 2021-01-13 DIAGNOSIS — Z79899 Other long term (current) drug therapy: Secondary | ICD-10-CM | POA: Insufficient documentation

## 2021-01-13 DIAGNOSIS — G4733 Obstructive sleep apnea (adult) (pediatric): Secondary | ICD-10-CM | POA: Insufficient documentation

## 2021-01-13 DIAGNOSIS — I5042 Chronic combined systolic (congestive) and diastolic (congestive) heart failure: Secondary | ICD-10-CM | POA: Diagnosis not present

## 2021-01-13 DIAGNOSIS — Z7984 Long term (current) use of oral hypoglycemic drugs: Secondary | ICD-10-CM | POA: Insufficient documentation

## 2021-01-13 DIAGNOSIS — I480 Paroxysmal atrial fibrillation: Secondary | ICD-10-CM

## 2021-01-13 DIAGNOSIS — E785 Hyperlipidemia, unspecified: Secondary | ICD-10-CM | POA: Diagnosis not present

## 2021-01-13 DIAGNOSIS — E1122 Type 2 diabetes mellitus with diabetic chronic kidney disease: Secondary | ICD-10-CM | POA: Insufficient documentation

## 2021-01-13 DIAGNOSIS — I130 Hypertensive heart and chronic kidney disease with heart failure and stage 1 through stage 4 chronic kidney disease, or unspecified chronic kidney disease: Secondary | ICD-10-CM | POA: Insufficient documentation

## 2021-01-13 DIAGNOSIS — Z794 Long term (current) use of insulin: Secondary | ICD-10-CM | POA: Diagnosis not present

## 2021-01-13 DIAGNOSIS — Z953 Presence of xenogenic heart valve: Secondary | ICD-10-CM | POA: Insufficient documentation

## 2021-01-13 DIAGNOSIS — I2510 Atherosclerotic heart disease of native coronary artery without angina pectoris: Secondary | ICD-10-CM | POA: Insufficient documentation

## 2021-01-13 DIAGNOSIS — I350 Nonrheumatic aortic (valve) stenosis: Secondary | ICD-10-CM | POA: Diagnosis not present

## 2021-01-13 LAB — CBC
HCT: 32.8 % — ABNORMAL LOW (ref 39.0–52.0)
Hemoglobin: 10.5 g/dL — ABNORMAL LOW (ref 13.0–17.0)
MCH: 29.7 pg (ref 26.0–34.0)
MCHC: 32 g/dL (ref 30.0–36.0)
MCV: 92.7 fL (ref 80.0–100.0)
Platelets: 141 10*3/uL — ABNORMAL LOW (ref 150–400)
RBC: 3.54 MIL/uL — ABNORMAL LOW (ref 4.22–5.81)
RDW: 16.6 % — ABNORMAL HIGH (ref 11.5–15.5)
WBC: 6.6 10*3/uL (ref 4.0–10.5)
nRBC: 0 % (ref 0.0–0.2)

## 2021-01-13 LAB — BASIC METABOLIC PANEL
Anion gap: 10 (ref 5–15)
BUN: 94 mg/dL — ABNORMAL HIGH (ref 8–23)
CO2: 31 mmol/L (ref 22–32)
Calcium: 8.8 mg/dL — ABNORMAL LOW (ref 8.9–10.3)
Chloride: 92 mmol/L — ABNORMAL LOW (ref 98–111)
Creatinine, Ser: 3.08 mg/dL — ABNORMAL HIGH (ref 0.61–1.24)
GFR, Estimated: 19 mL/min — ABNORMAL LOW (ref >60)
Glucose, Bld: 110 mg/dL — ABNORMAL HIGH (ref 70–99)
Potassium: 3 mmol/L — ABNORMAL LOW (ref 3.5–5.1)
Sodium: 133 mmol/L — ABNORMAL LOW (ref 135–145)

## 2021-01-13 MED ORDER — METOLAZONE 2.5 MG PO TABS: 2.5000 mg | ORAL_TABLET | ORAL | 3 refills | Status: DC

## 2021-01-13 MED ORDER — TORSEMIDE 20 MG PO TABS: 100.0000 mg | ORAL_TABLET | Freq: Two times a day (BID) | ORAL | 11 refills | Status: AC

## 2021-01-13 MED ORDER — POTASSIUM CHLORIDE CRYS ER 20 MEQ PO TBCR: 40.0000 meq | EXTENDED_RELEASE_TABLET | Freq: Every day | ORAL | 3 refills | Status: DC

## 2021-01-13 NOTE — Patient Instructions (Addendum)
EKG done today.  Labs done today. We will contact you only if your labs are abnormal.  INCREASE Metolazone to 2.5mg  (1 tablet) by mouth every Monday, Wednesday and Friday with morning dose of Torsemide.  INCREASE Potassium to 40meq(2 tablets) by mouth daily.  INCREASE Torsemide to 100mg  (5 tablets) by mouth 2 times daily.   No other medication changes were made. Please continue all current medications as prescribed.  Our social workers will be in contact with you regarding setting up a time for our paramedicine team to come out and see you.  Your physician recommends that you schedule a follow-up appointment in: 1 week for a lab only appointment and in 2 weeks with our APP Clinic here in office  If you have any questions or concerns before your next appointment please send [place] a message through [place] or call our office at [phone].    TO LEAVE A MESSAGE FOR THE NURSE SELECT OPTION 2, PLEASE LEAVE A MESSAGE INCLUDING: . YOUR NAME . DATE OF BIRTH . CALL BACK NUMBER . REASON FOR CALL**this is important as we prioritize the call backs  YOU WILL RECEIVE A CALL BACK THE SAME DAY AS LONG AS YOU CALL BEFORE 4:00 PM   Do the following things EVERYDAY: 1) Weigh yourself in the morning before breakfast. Write it down and keep it in a log. 2) Take your medicines as prescribed 3) Eat low salt foods--Limit salt (sodium) to 2000 mg per day.  4) Stay as active as you can everyday 5) Limit all fluids for the day to less than 2 liters   At the [facility], you and your health needs are our priority. As part of our continuing mission to provide you with exceptional heart care, we have created designated Provider Care Teams. These Care Teams include your primary Cardiologist (physician) and Advanced Practice Providers (APPs- Physician Assistants and Nurse Practitioners) who all work together to provide you with the care you need, when you need it.   You may see any of the  following providers on your designated Care Team at your next follow up: [name] Dr Glori Bickers . Dr [name] . Darrick Grinder, NP . Lyda Jester, PA . Audry Riles, PharmD   Please be sure to bring in all your medications bottles to every appointment.

## 2021-01-13 NOTE — Telephone Encounter (Signed)
Paramedicine Initial Assessment:  Housing:  In what kind of housing do you live? House/apt/trailer/shelter? house  Do you live with anyone? Wife and son  Are you currently worried about losing your housing? no  Within the past 12 months have you ever stayed outside, in a car, tent, a shelter, or temporarily with someone? no  Within the past 12 months have you been unable to get utilities when it was really needed? no  Social:  What is your current marital status? married  Do you have any children? Son who lives in the house and 3 other children who live out of state  Food:  Within the past 12 months were you ever worried that food would run out before you got money to buy more? no  Within the past 3months have you run out of food and didn't have money to buy more? no  Income:  What is your current source of income? Retirement income  How hard is it for you to pay for the basics like food housing, medical care, and utilities? Not very hard  Do you have outstanding medical bills? no  Insurance:  Are you currently insured? yes  Do you have prescription coverage? yes   Transportation:  Do you have transportation to your medical appointments? Wife drives him to and from appts.   In the past 12 months has lack of transportation kept you from medical appts or from getting medications? no  In the past 12 months has lack of transportation kept you from meetings, work, or getting things you needed? no   Daily Health Needs: Do you have a working scale at home? yes  How do you manage your medications at home? Uses a pillbox- wife helps him organize  Do you ever take your medications differently than prescribed? no  Do you have issues affording your medications? no  If yes, has this ever prevented you from obtaining medications? n/a  Do you have any concerns with mobility at home? Yes- very weak at this time makes it hard to do ADLs and has lead to him   Do you use any  assistive devices at home or have PCS at home? Uses a walker to get a round- can get to the bathroom on his own but needs some help and supervision with showering and has hand rail.  Did have home health through [address] (OT, PT)   Do you have a PCP? Yes- Howard Pouch  Do you have any trouble reading or writing? no  Are there any additional barriers you see to getting the care you need? None reported  CSW will continue to follow through paramedicine program and assist as needed.  Jorge Ny, LCSW Clinical Social Worker [facility] Desk#: [phone] Cell#: [phone]

## 2021-01-13 NOTE — Progress Notes (Signed)
PCP: Dr Raoul Pitch EP: Dr Lovena Le Cardiology: Dr Tamala Julian  Primary HF Cardiologist: Dr Aundra Dubin   HPI: [name] is a 87 y.o. with a history of chronic systolic CHF, echo 8/11 showed reduced EF 35-40% with moderate LVH, RV normal. Also history of severe aortic stenosis status post TAVR in2018, symptomatic bradycardia/RBBBstatus post Medtronic permanent pacemaker implanted 10/2019, CAD, PAF, insulin-dependent diabetes, stage IVCKD, severe OSAintolerant toCPAP, hypertension, as well as history of carpal tunnel status post bilateralcarpal tunnel release.  Presented to the ED on 10/01/20. Markedly fluid overloaded.Started on IV lasix + empiric milrinone.   His device was interrogated and showed he had been in persistent atrial fibrillation since 09/06/20 w/ 34% RV pacing. Underwent TEE guided DCCV back to NSR. TEE (10/07/20) with EF 25-30%, mildly decreased RV systolic function, normal function of TAVR valve. Hospital course complicated by AKI/urosepsis. Work up for amyloid was negative. Myeloma panel, urine IFXN, and PYP were negative. Will need followup with EP =>discussed with Dr. Quentin Ore, suspect he needed CRT given high percentage (>30%) RV pacing and a-pacing with very long PR interval. Discharged to SNF, [facility]. Discharge weight was 223 pounds.   He was sent to [facility] 10/19/20 for anxiety/tremors. CT of head was negative for hemorrhage. He was sent back to SNF.  Creatinine 1.8  And K 5.1.   On 11/21/20 he was referred to [facility] for volume overload. Overall he received 6 doses of IV lasix over the 4 days.   He was seen in the clinic 3/22 and he remained volume overloaded. [facility] was instructed to give  80 mg IV lasix x1 + 3/23 &3/24 5 mg metolazone once  + 80 mg IV twice daily + 20 meq potassium.  On 12/01/20, he had upgrade to MDT CRT-P device.   Today he returns for HF followup with his wife. He continues to struggle with the combination of CHF and CKD.  Weight  is down 2 lbs.  He feels worse overall, weaker.  Using walker around the house due to "weakness."  BP has not been low. He is short of breath walking about 100 feet.  Still with significant peripheral edema. No chest pain, no palpitations, no falls.   ECG (personally reviewed): A-BiV dual pacing  Medtronic device interrogation: >99% BiV pacing, fluid index < threshold.     PMH: 1. Aortic stenosis: S/p TAVR in 2018.  2. Symptomatic bradycardia: S/p MDT PPM 2/21, upgraded to CRT-P.  3. Atrial fibrillation: Paroxysmal - DCCV 2/22 4. Type 2 diabetes 5. OSA: Cannot tolerate CPAP 6. CKD stage IV 7. HTN 8. Carpal tunnel syndrome s/p release.  9. Chronic systolic CHF: Mixed ischemia/nonischemic cardiomyopathy.  Has MDT CRT-P device.  - LHC (2018) with occluded PDA and 90% stenosis left PDA, treated medically.  - Echo (1/21): EF 35-40%, normal RV.  - Echo (1/22): EF 30-35%, severe biatrial enlargement, stable bioprosthetic aortic valve s/p TAVR, mildly decreased RV function.  - TEE (2/22): EF 25-30%, mildly decreased RV systolic function, normal function of TAVR valve - PYP scan (2/22): Not suggestive of TTR amyloidosis.  10. Hyperlipidemia  Labs (4/22): K 3.5, creatinine 2.63 => 2.48, LDL 52, HDL 46, LFTs and TSH normal  ROS: All systems negative except as listed in HPI, PMH and Problem List.  Social History   Socioeconomic History  . Marital status: Married    Spouse name: Not on file  . Number of children: 4  . Years of education: Not on file  .  Highest education level: Not on file  Occupational History  . Occupation: Construction-Retired  Tobacco Use  . Smoking status: Never Smoker  . Smokeless tobacco: Never Used  Vaping Use  . Vaping Use: Never used  Substance and Sexual Activity  . Alcohol use: Not Currently  . Drug use: Not Currently  . Sexual activity: Not Currently    Partners: Female  Other Topics Concern  . 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 Difficulty of Paying Living Expenses: Not very hard  Food Insecurity: No Food Insecurity  . Worried About Charity fundraiser in the Last Year: Never true  . Ran Out of Food in the Last Year: Never true  Transportation Needs: No Transportation Needs  . Lack of Transportation (Medical): No  . Lack of Transportation (Non-Medical): No  Physical Activity: Not on file  Stress: Not on file  Social Connections: Not on file  Intimate Partner Violence: Not on file   Family History  Problem Relation Age of Onset  . Heart failure Father   . Emphysema Father   . COPD Father   . Early death Father   . Heart attack Father   . Hypertension Mother   . Early death Mother   . Hyperlipidemia Mother   . Stroke Mother   . Cancer Sister   . Hypertension Sister   . Healthy Sister     Current Outpatient Medications  Medication Sig Dispense Refill  . ACCU-CHEK AVIVA PLUS test strip USE AS INSTRUCTED TO CHECK BLOOD SUGAR TWICE A DAY DX:E11.65 100 strip 2  . acetaminophen (TYLENOL) 500 MG tablet Take 500 mg by mouth every 4 (four) hours as needed for moderate pain or headache.    [name] amiodarone (PACERONE) 200 MG tablet Take 1 tablet (200 mg total) by mouth daily. 30 tablet 5  . apixaban (ELIQUIS) 2.5 MG TABS tablet Take 1 tablet (2.5 mg total) by mouth 2 (two) times daily. 180 tablet 1  . B-D UF III MINI PEN NEEDLES 31G X 5 MM MISC USE 2 PEN NEEDLE PER DAY WITH HUMALOG AND VICTOZA 200 each 3  . BD INSULIN SYRINGE U/F 31G X 5/16" 1 ML MISC USE TO INJECT INSULIN DAILY. DX:E11.9 100 each 1  . ferrous sulfate 325 (65 FE) MG tablet Take 325 mg by mouth 2 (two) times daily with a meal.     .  glimepiride (AMARYL) 2 MG tablet TAKE 1 TABLET DAILY BEFORE BREAKFAST 90 tablet 3  . HUMALOG KWIKPEN 100 UNIT/ML KwikPen INJECT 12 UNITS INTO THE SKIN 2 (TWO) TIMES DAILY BEFORE LUNCH AND SUPPER. INJECT 12 UNITS UNDER SKIN BEFORE LUNCH AND DINNER. MAY ALSO INJECT 4 UNIT AT BREAKFAST IS SUGAR READING IS OVER 100. 15 mL 1  . insulin NPH Human (HUMULIN N) 100 UNIT/ML injection Inject 26 units of Humulin N under the skin once daily at bedtime. (Patient taking differently: Inject 26 units of Humulin N under the skin once daily at bedtime.) 90 mL 1  . liraglutide (VICTOZA) 18 MG/3ML SOPN Inject 1.8 mg daily 27 mL 3  . Multiple Vitamin (MULTIVITAMIN WITH MINERALS) TABS tablet Take  1 tablet by mouth daily. Mens One a day Vit    . Multiple Vitamins-Minerals (PRESERVISION AREDS 2 PO) Take 1 capsule by mouth 2 (two) times daily.    . ondansetron (ZOFRAN) 4 MG tablet Take 1 tablet (4 mg total) by mouth every 8 (eight) hours as needed for nausea or vomiting. 30 tablet 2  . pantoprazole (PROTONIX) 40 MG tablet TAKE 1 TABLET TWICE A DAY 180 tablet 3  . pravastatin (PRAVACHOL) 80 MG tablet Take 1 tablet (80 mg total) by mouth every evening. 30 tablet 0  . tamsulosin (FLOMAX) 0.4 MG CAPS capsule Take 1 capsule daily 90 capsule 1  . [START ON 01/14/2021] metolazone (ZAROXOLYN) 2.5 MG tablet Take 1 tablet (2.5 mg total) by mouth every Monday, Wednesday, and Friday. 39 tablet 3  . potassium chloride SA (KLOR-CON) 20 MEQ tablet Take 2 tablets (40 mEq total) by mouth daily. 180 tablet 3  . torsemide (DEMADEX) 20 MG tablet Take 5 tablets (100 mg total) by mouth 2 (two) times daily. 300 tablet 11   No current facility-administered medications for this encounter.    Vitals:   01/13/21 1142  BP: 120/68  Pulse: 69  SpO2: 100%  Weight: 102.7 kg (226 lb 6.4 oz)   Wt Readings from Last 3 Encounters:  01/13/21 102.7 kg (226 lb 6.4 oz)  12/23/20 103.4 kg (228 lb)  12/18/20 101.6 kg (224 lb)    PHYSICAL EXAM: General:  NAD Neck: JVP 10 cm, no thyromegaly or thyroid nodule.  Lungs: Clear to auscultation bilaterally with normal respiratory effort. CV: Nondisplaced PMI.  Heart regular S1/S2, no S3/S4, no murmur.  2+ edema to thighs.  No carotid bruit.  Normal pedal pulses.  Abdomen: Soft, nontender, no hepatosplenomegaly, no distention.  Skin: Intact without lesions or rashes.  Neurologic: Alert and oriented x 3.  Psych: Normal affect. Extremities: No clubbing or cyanosis.  HEENT: Normal.   ASSESSMENT & PLAN: 1. Chronic systolic CHF: Suspect mixed ischemic/nonischemic cardiomyopathy.  Atrial fibrillation and excessive RV pacing may contribute to cardiomyopathy. Last cath in 2018 pre-TAVR with occluded D2 and 90% stenosis left PDA. He had MDT PPM for symptomatic bradycardia in 2/21, upgraded to CRT-P in 3/22 due to high percentage of RV pacing.  TEE (2/22) with EF 25-30%, mildly decreased RV systolic function, normal function of TAVR valve.  PYP scan not suggestive of TTR cardiac amyloidosis.  He required milrinone during 2/22 admission.  CHF is complicated by CKD stage IV.  By exam he remains markedly volume overloaded (fluid index not > threshold by Optivol, but suspect this is not correct).  - Increase torsemide to 100 mg bid.  - Increase metolazone to 2.5 mg three times/week (MWF).   - Increase KCl to 40 daily with an extra 20 on metolazone days.  - BMET today and again in 1 week.  - No ARNI, spironolactone with CKD stage IV.  - He did not tolerate Farxiga - Continue to wear compression stockings.  - Avoiding beta blocker with marked volume overload and low output HF requiring milrinone transiently at last admission.  - Not candidate for advanced therapies with age and CKD stage IV.  - I will arrange for paramedicine to follow.  Would be helpful to be able to give Lasix IV at home.  2. Atrial fibrillation: Paroxysmal. TEE-DCCV on 10/07/20 initially converted him to NSR but back to atrial fibrillation soon  after.  Converted back to NSR on 10/08/20 with amiodarone. He is in NSR today.  -  Continue amio 200 mg daily.  Recent LFTs/TSH normal, will need regular eye exam.  - Continue Eliquis 2.5 mg bid. There has been consideration for Watchman, but he is not falling frequently and does not have GI bleeding history.  Given CKD and uncontrolled CHF, would avoid the additional procedure.  3. CAD: Cath in 2018 pre-TAVR with occluded D2 and 90% stenosis left PDA, medically managed.  No chest pain.  - On Eliquis so no aspirin.  - Continue pravastatin, good lipids in 4/22.  4. CKD Stage IV - BMET today.  - He has an appointment with nephrology.  5. Bradycardia --> PPM Medtronic --> upgraded to CRT-P on 12/01/20.   Followup in 2 wks with NP/PA to reassess renal function and volume.   [name] 01/13/2021

## 2021-01-14 ENCOUNTER — Telehealth (HOSPITAL_COMMUNITY): Payer: Self-pay

## 2021-01-14 MED ORDER — POTASSIUM CHLORIDE CRYS ER 20 MEQ PO TBCR: 40.0000 meq | EXTENDED_RELEASE_TABLET | Freq: Every day | ORAL | 3 refills | Status: DC

## 2021-01-14 NOTE — Telephone Encounter (Signed)
Pt wife aware of results and recommendations. Advised to monitor fluid level and notify office of worsening fluid overload. Pt has lab appt in 1 week. Advised if not improved he may need admission to hospital for management. Verbalized understanding.

## 2021-01-14 NOTE — Telephone Encounter (Signed)
-----   Message from Larey Dresser, MD sent at 01/13/2021  8:42 PM EDT ----- Worsening renal function in addition to ongoing significant volume overload is concerning.  Make sure he has followup with renal.  He should have close followup in CHF clinic, will need admission for milrinone/diuresis if renal function were to worsen any further or if volume status does not improve.  Take KCl 40 daily and 60 daily on metolazone days. Make sure he gets BMET early next week.  I will not change diuretic plan today as he was quite volume overloaded.

## 2021-01-20 ENCOUNTER — Ambulatory Visit (HOSPITAL_COMMUNITY)
Admission: RE | Admit: 2021-01-20 | Discharge: 2021-01-20 | Disposition: A | Discharge status: Home / Self Care | Payer: Medicare Other | Source: Ambulatory Visit | Attending: Cardiology | Admitting: Cardiology

## 2021-01-20 ENCOUNTER — Other Ambulatory Visit: Payer: Self-pay

## 2021-01-20 ENCOUNTER — Encounter: Payer: Medicare Other | Admitting: Cardiology

## 2021-01-20 DIAGNOSIS — I5042 Chronic combined systolic (congestive) and diastolic (congestive) heart failure: Secondary | ICD-10-CM | POA: Insufficient documentation

## 2021-01-20 LAB — BASIC METABOLIC PANEL
Anion gap: 14 (ref 5–15)
BUN: 92 mg/dL — ABNORMAL HIGH (ref 8–23)
CO2: 29 mmol/L (ref 22–32)
Calcium: 8.5 mg/dL — ABNORMAL LOW (ref 8.9–10.3)
Chloride: 90 mmol/L — ABNORMAL LOW (ref 98–111)
Creatinine, Ser: 2.7 mg/dL — ABNORMAL HIGH (ref 0.61–1.24)
GFR, Estimated: 23 mL/min — ABNORMAL LOW (ref >60)
Glucose, Bld: 120 mg/dL — ABNORMAL HIGH (ref 70–99)
Potassium: 3.4 mmol/L — ABNORMAL LOW (ref 3.5–5.1)
Sodium: 133 mmol/L — ABNORMAL LOW (ref 135–145)

## 2021-01-21 ENCOUNTER — Telehealth (HOSPITAL_COMMUNITY): Payer: Self-pay | Admitting: *Deleted

## 2021-01-21 ENCOUNTER — Inpatient Hospital Stay: Payer: Self-pay

## 2021-01-21 ENCOUNTER — Inpatient Hospital Stay (HOSPITAL_COMMUNITY)
Admission: AD | Admit: 2021-01-21 | Discharge: 2021-01-29 | DRG: 291 | Disposition: A | Discharge status: Home Health | Payer: Medicare Other | Attending: Cardiology | Admitting: Cardiology

## 2021-01-21 ENCOUNTER — Other Ambulatory Visit (HOSPITAL_COMMUNITY): Payer: Self-pay

## 2021-01-21 DIAGNOSIS — I428 Other cardiomyopathies: Secondary | ICD-10-CM | POA: Diagnosis present

## 2021-01-21 DIAGNOSIS — I5023 Acute on chronic systolic (congestive) heart failure: Secondary | ICD-10-CM | POA: Diagnosis present

## 2021-01-21 DIAGNOSIS — N184 Chronic kidney disease, stage 4 (severe): Secondary | ICD-10-CM | POA: Diagnosis present

## 2021-01-21 DIAGNOSIS — E1122 Type 2 diabetes mellitus with diabetic chronic kidney disease: Secondary | ICD-10-CM | POA: Diagnosis present

## 2021-01-21 DIAGNOSIS — E7800 Pure hypercholesterolemia, unspecified: Secondary | ICD-10-CM | POA: Diagnosis present

## 2021-01-21 DIAGNOSIS — Z950 Presence of cardiac pacemaker: Secondary | ICD-10-CM | POA: Diagnosis not present

## 2021-01-21 DIAGNOSIS — I2510 Atherosclerotic heart disease of native coronary artery without angina pectoris: Secondary | ICD-10-CM | POA: Diagnosis present

## 2021-01-21 DIAGNOSIS — I5084 End stage heart failure: Secondary | ICD-10-CM | POA: Diagnosis present

## 2021-01-21 DIAGNOSIS — Z7984 Long term (current) use of oral hypoglycemic drugs: Secondary | ICD-10-CM | POA: Diagnosis not present

## 2021-01-21 DIAGNOSIS — G4733 Obstructive sleep apnea (adult) (pediatric): Secondary | ICD-10-CM | POA: Diagnosis present

## 2021-01-21 DIAGNOSIS — E785 Hyperlipidemia, unspecified: Secondary | ICD-10-CM | POA: Diagnosis present

## 2021-01-21 DIAGNOSIS — N179 Acute kidney failure, unspecified: Secondary | ICD-10-CM | POA: Diagnosis present

## 2021-01-21 DIAGNOSIS — Z20822 Contact with and (suspected) exposure to covid-19: Secondary | ICD-10-CM | POA: Diagnosis present

## 2021-01-21 DIAGNOSIS — Z953 Presence of xenogenic heart valve: Secondary | ICD-10-CM | POA: Diagnosis not present

## 2021-01-21 DIAGNOSIS — Z794 Long term (current) use of insulin: Secondary | ICD-10-CM

## 2021-01-21 DIAGNOSIS — I130 Hypertensive heart and chronic kidney disease with heart failure and stage 1 through stage 4 chronic kidney disease, or unspecified chronic kidney disease: Secondary | ICD-10-CM | POA: Diagnosis present

## 2021-01-21 DIAGNOSIS — E861 Hypovolemia: Secondary | ICD-10-CM | POA: Diagnosis present

## 2021-01-21 DIAGNOSIS — I509 Heart failure, unspecified: Secondary | ICD-10-CM | POA: Diagnosis not present

## 2021-01-21 DIAGNOSIS — F419 Anxiety disorder, unspecified: Secondary | ICD-10-CM | POA: Diagnosis present

## 2021-01-21 DIAGNOSIS — I5043 Acute on chronic combined systolic (congestive) and diastolic (congestive) heart failure: Secondary | ICD-10-CM | POA: Diagnosis present

## 2021-01-21 DIAGNOSIS — Z515 Encounter for palliative care: Secondary | ICD-10-CM | POA: Diagnosis not present

## 2021-01-21 DIAGNOSIS — Z7901 Long term (current) use of anticoagulants: Secondary | ICD-10-CM | POA: Diagnosis not present

## 2021-01-21 DIAGNOSIS — R531 Weakness: Secondary | ICD-10-CM | POA: Diagnosis not present

## 2021-01-21 DIAGNOSIS — Z66 Do not resuscitate: Secondary | ICD-10-CM | POA: Diagnosis not present

## 2021-01-21 DIAGNOSIS — Z79899 Other long term (current) drug therapy: Secondary | ICD-10-CM

## 2021-01-21 DIAGNOSIS — E871 Hypo-osmolality and hyponatremia: Secondary | ICD-10-CM | POA: Diagnosis present

## 2021-01-21 DIAGNOSIS — Z7189 Other specified counseling: Secondary | ICD-10-CM | POA: Diagnosis not present

## 2021-01-21 DIAGNOSIS — I495 Sick sinus syndrome: Secondary | ICD-10-CM | POA: Diagnosis not present

## 2021-01-21 DIAGNOSIS — Z95828 Presence of other vascular implants and grafts: Secondary | ICD-10-CM

## 2021-01-21 LAB — COMPREHENSIVE METABOLIC PANEL
ALT: 17 U/L (ref 0–44)
AST: 27 U/L (ref 15–41)
Albumin: 3.1 g/dL — ABNORMAL LOW (ref 3.5–5.0)
Alkaline Phosphatase: 76 U/L (ref 38–126)
Anion gap: 9 (ref 5–15)
BUN: 89 mg/dL — ABNORMAL HIGH (ref 8–23)
CO2: 30 mmol/L (ref 22–32)
Calcium: 8.5 mg/dL — ABNORMAL LOW (ref 8.9–10.3)
Chloride: 93 mmol/L — ABNORMAL LOW (ref 98–111)
Creatinine, Ser: 2.75 mg/dL — ABNORMAL HIGH (ref 0.61–1.24)
GFR, Estimated: 22 mL/min — ABNORMAL LOW (ref >60)
Glucose, Bld: 119 mg/dL — ABNORMAL HIGH (ref 70–99)
Potassium: 3.5 mmol/L (ref 3.5–5.1)
Sodium: 132 mmol/L — ABNORMAL LOW (ref 135–145)
Total Bilirubin: 0.6 mg/dL (ref 0.3–1.2)
Total Protein: 6.2 g/dL — ABNORMAL LOW (ref 6.5–8.1)

## 2021-01-21 LAB — CBC WITH DIFFERENTIAL/PLATELET
Abs Immature Granulocytes: 0.02 10*3/uL (ref 0.00–0.07)
Basophils Absolute: 0 10*3/uL (ref 0.0–0.1)
Basophils Relative: 0 %
Eosinophils Absolute: 0.1 10*3/uL (ref 0.0–0.5)
Eosinophils Relative: 1 %
HCT: 30.7 % — ABNORMAL LOW (ref 39.0–52.0)
Hemoglobin: 9.9 g/dL — ABNORMAL LOW (ref 13.0–17.0)
Immature Granulocytes: 0 %
Lymphocytes Relative: 15 %
Lymphs Abs: 0.9 10*3/uL (ref 0.7–4.0)
MCH: 29.3 pg (ref 26.0–34.0)
MCHC: 32.2 g/dL (ref 30.0–36.0)
MCV: 90.8 fL (ref 80.0–100.0)
Monocytes Absolute: 1 10*3/uL (ref 0.1–1.0)
Monocytes Relative: 17 %
Neutro Abs: 4.2 10*3/uL (ref 1.7–7.7)
Neutrophils Relative %: 67 %
Platelets: 155 10*3/uL (ref 150–400)
RBC: 3.38 MIL/uL — ABNORMAL LOW (ref 4.22–5.81)
RDW: 15.9 % — ABNORMAL HIGH (ref 11.5–15.5)
WBC: 6.3 10*3/uL (ref 4.0–10.5)
nRBC: 0 % (ref 0.0–0.2)

## 2021-01-21 LAB — GLUCOSE, CAPILLARY: Glucose-Capillary: 207 mg/dL — ABNORMAL HIGH (ref 70–99)

## 2021-01-21 LAB — HEMOGLOBIN A1C
Hgb A1c MFr Bld: 7.2 % — ABNORMAL HIGH (ref 4.8–5.6)
Mean Plasma Glucose: 159.94 mg/dL

## 2021-01-21 LAB — SARS CORONAVIRUS 2 (TAT 6-24 HRS): SARS Coronavirus 2: NEGATIVE

## 2021-01-21 LAB — BRAIN NATRIURETIC PEPTIDE: B Natriuretic Peptide: 1245.4 pg/mL — ABNORMAL HIGH (ref 0.0–100.0)

## 2021-01-21 LAB — MAGNESIUM: Magnesium: 2.5 mg/dL — ABNORMAL HIGH (ref 1.7–2.4)

## 2021-01-21 MED ORDER — TAMSULOSIN HCL 0.4 MG PO CAPS
0.4000 mg | ORAL_CAPSULE | Freq: Every day | ORAL | Status: DC
  Administered 2021-01-21 – 2021-01-28 (×8): 0.4 mg via ORAL
  Filled 2021-01-21 (×8): qty 1

## 2021-01-21 MED ORDER — INSULIN ASPART 100 UNIT/ML IJ SOLN
0.0000 [IU] | Freq: Three times a day (TID) | INTRAMUSCULAR | Status: DC
Start: 2021-01-22 — End: 2021-01-29
  Administered 2021-01-22 (×2): 5 [IU] via SUBCUTANEOUS
  Administered 2021-01-23: 2 [IU] via SUBCUTANEOUS
  Administered 2021-01-23: 3 [IU] via SUBCUTANEOUS
  Administered 2021-01-23: 5 [IU] via SUBCUTANEOUS
  Administered 2021-01-24: 2 [IU] via SUBCUTANEOUS
  Administered 2021-01-24: 5 [IU] via SUBCUTANEOUS
  Administered 2021-01-25 (×2): 3 [IU] via SUBCUTANEOUS
  Administered 2021-01-25: 2 [IU] via SUBCUTANEOUS
  Administered 2021-01-26 (×2): 5 [IU] via SUBCUTANEOUS
  Administered 2021-01-27: 2 [IU] via SUBCUTANEOUS
  Administered 2021-01-27: 5 [IU] via SUBCUTANEOUS
  Administered 2021-01-27: 8 [IU] via SUBCUTANEOUS
  Administered 2021-01-28 (×2): 5 [IU] via SUBCUTANEOUS
  Administered 2021-01-29 (×2): 2 [IU] via SUBCUTANEOUS

## 2021-01-21 MED ORDER — SODIUM CHLORIDE 0.9% FLUSH
3.0000 mL | Freq: Two times a day (BID) | INTRAVENOUS | Status: DC
  Administered 2021-01-23 – 2021-01-28 (×12): 3 mL via INTRAVENOUS

## 2021-01-21 MED ORDER — SODIUM CHLORIDE 0.9 % IV SOLN: 250.0000 mL | INTRAVENOUS | Status: DC | PRN

## 2021-01-21 MED ORDER — ACETAMINOPHEN 500 MG PO TABS
500.0000 mg | ORAL_TABLET | ORAL | Status: DC | PRN
  Administered 2021-01-21 – 2021-01-28 (×10): 500 mg via ORAL
  Filled 2021-01-21 (×10): qty 1

## 2021-01-21 MED ORDER — ONDANSETRON HCL 4 MG/2ML IJ SOLN
4.0000 mg | Freq: Three times a day (TID) | INTRAMUSCULAR | Status: AC | PRN
  Administered 2021-01-21: 4 mg via INTRAVENOUS
  Filled 2021-01-21: qty 2

## 2021-01-21 MED ORDER — MILRINONE LACTATE IN DEXTROSE 20-5 MG/100ML-% IV SOLN
0.2500 ug/kg/min | INTRAVENOUS | Status: DC
  Administered 2021-01-21 – 2021-01-29 (×16): 0.25 ug/kg/min via INTRAVENOUS
  Filled 2021-01-21 (×16): qty 100

## 2021-01-21 MED ORDER — POTASSIUM CHLORIDE CRYS ER 20 MEQ PO TBCR
40.0000 meq | EXTENDED_RELEASE_TABLET | Freq: Every day | ORAL | Status: DC
  Administered 2021-01-21 – 2021-01-22 (×2): 40 meq via ORAL
  Filled 2021-01-21 (×2): qty 2

## 2021-01-21 MED ORDER — FUROSEMIDE 10 MG/ML IJ SOLN
80.0000 mg | Freq: Once | INTRAMUSCULAR | Status: AC
  Administered 2021-01-21: 80 mg via INTRAVENOUS
  Filled 2021-01-21: qty 8

## 2021-01-21 MED ORDER — FERROUS SULFATE 325 (65 FE) MG PO TABS
325.0000 mg | ORAL_TABLET | Freq: Two times a day (BID) | ORAL | Status: DC
  Administered 2021-01-21 – 2021-01-29 (×16): 325 mg via ORAL
  Filled 2021-01-21 (×16): qty 1

## 2021-01-21 MED ORDER — FUROSEMIDE 10 MG/ML IJ SOLN
20.0000 mg/h | INTRAVENOUS | Status: DC
  Administered 2021-01-21: 12 mg/h via INTRAVENOUS
  Administered 2021-01-22: 15 mg/h via INTRAVENOUS
  Administered 2021-01-22: 12 mg/h via INTRAVENOUS
  Administered 2021-01-23: 15 mg/h via INTRAVENOUS
  Administered 2021-01-24: 20 mg/h via INTRAVENOUS
  Administered 2021-01-24: 15 mg/h via INTRAVENOUS
  Administered 2021-01-25 – 2021-01-27 (×6): 20 mg/h via INTRAVENOUS
  Filled 2021-01-21 (×14): qty 20

## 2021-01-21 MED ORDER — PANTOPRAZOLE SODIUM 40 MG PO TBEC
40.0000 mg | DELAYED_RELEASE_TABLET | Freq: Two times a day (BID) | ORAL | Status: DC
  Administered 2021-01-21 – 2021-01-29 (×16): 40 mg via ORAL
  Filled 2021-01-21 (×16): qty 1

## 2021-01-21 MED ORDER — GLIMEPIRIDE 2 MG PO TABS
2.0000 mg | ORAL_TABLET | Freq: Every day | ORAL | Status: DC
  Administered 2021-01-22 – 2021-01-29 (×8): 2 mg via ORAL
  Filled 2021-01-21 (×8): qty 1

## 2021-01-21 MED ORDER — AMIODARONE HCL 200 MG PO TABS
200.0000 mg | ORAL_TABLET | Freq: Every day | ORAL | Status: DC
  Administered 2021-01-22 – 2021-01-29 (×8): 200 mg via ORAL
  Filled 2021-01-21 (×8): qty 1

## 2021-01-21 MED ORDER — APIXABAN 2.5 MG PO TABS
2.5000 mg | ORAL_TABLET | Freq: Two times a day (BID) | ORAL | Status: DC
  Administered 2021-01-21 – 2021-01-29 (×16): 2.5 mg via ORAL
  Filled 2021-01-21 (×16): qty 1

## 2021-01-21 MED ORDER — PRAVASTATIN SODIUM 40 MG PO TABS
80.0000 mg | ORAL_TABLET | Freq: Every day | ORAL | Status: DC
  Administered 2021-01-21 – 2021-01-28 (×8): 80 mg via ORAL
  Filled 2021-01-21 (×8): qty 2

## 2021-01-21 MED ORDER — SODIUM CHLORIDE 0.9% FLUSH: 3.0000 mL | INTRAVENOUS | Status: DC | PRN

## 2021-01-21 NOTE — Progress Notes (Signed)
Orthopedic Tech Progress Note Patient Details:  Add Dinapoli Juba 04/12/1934 [id number]  Ortho Devices Type of Ortho Device: Haematologist Ortho Device/Splint Location: Bilateral Ortho Device/Splint Interventions: Application,Ordered   Post Interventions Patient Tolerated: Well   Chip Boer 01/21/2021, 6:44 PM

## 2021-01-21 NOTE — Telephone Encounter (Signed)
Todd Romero with paramedicine called stating pts volume is still up. Swelling is past patients knees. Per Dr.McLean pt will need direct admit for milrinone/diuresis. Pt and wife aware.Bed requested and Brittainy Simmons,PA aware.

## 2021-01-21 NOTE — Progress Notes (Signed)
Paramedicine Encounter    Patient ID: Todd Romero, male    DOB: Oct 24, 1933, 87 y.o.   MRN: [id number]   Patient Care Team: Ma Hillock, DO as PCP - General (Family Medicine) Belva Crome, MD as PCP - Cardiology (Cardiology) [name], MD as Consulting Physician (Endocrinology) Thompson Grayer, MD as Consulting Physician (Cardiology) Danice Goltz, MD as Consulting Physician (Ophthalmology) Monna Fam, MD as Consulting Physician (Ophthalmology) Carol Ada, MD as Consulting Physician (Gastroenterology) Evans Lance, MD as Consulting Physician (Cardiology) Ma Hillock, DO (Family Medicine)  Patient Active Problem List   Diagnosis Date Noted  . Gastroesophageal reflux disease 11/05/2020  . Morbid obesity ([place]) 11/05/2020  . Anxiety 10/22/2020  . Generalized weakness 10/21/2020  . At high risk for falls 10/21/2020  . Tremor 10/18/2020  . Acute on chronic diastolic HF (heart failure) ([place]) 10/01/2020  . Thyroid nodule greater than or equal to 1 cm in diameter incidentally noted on imaging study 09/01/2020  . Spondylosis, cervical 09/01/2020  . Sleep disturbance 04/23/2020  . Tachycardia-bradycardia syndrome ([place]) 02/06/2020  . Pacemaker 11/08/2019  . Chronic anticoagulation 11/08/2019  . Benign prostatic hyperplasia 10/01/2019  . Stage 3b chronic kidney disease ([place]) 10/01/2019  . 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 Chronic combined systolic and diastolic CHF (congestive heart failure) ([place]) 03/27/2018  . Persistent atrial fibrillation ([place]) 03/04/2018  . GIB (gastrointestinal bleeding) 11/18/2017  . Anemia 11/16/2017  . 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 Aortic stenosis 12/18/2015  . Obesity (BMI 30-39.9) 06/18/2015  . Obstructive sleep apnea 02/11/2015  . Primary osteoarthritis of right knee 03/18/2014  . Essential hypertension, benign 06/21/2013    Class: Chronic  . RBBB   . CAD (coronary artery disease)      Class: Chronic  . Hyperlipidemia   . Type 2 diabetes mellitus ([place]) 05/02/2013  . Retinal tear, left 11/16/2011    Current Outpatient Medications:  .  ACCU-CHEK AVIVA PLUS test strip, USE AS INSTRUCTED TO CHECK BLOOD SUGAR TWICE A DAY DX:E11.65, Disp: 100 strip, Rfl: 2 .  acetaminophen (TYLENOL) 500 MG tablet, Take 500 mg by mouth every 4 (four) hours as needed for moderate pain or headache., Disp: , Rfl:  .  amiodarone (PACERONE) 200 MG tablet, Take 1 tablet (200 mg total) by mouth daily., Disp: 30 tablet, Rfl: 5 .  apixaban (ELIQUIS) 2.5 MG TABS tablet, Take 1 tablet (2.5 mg total) by mouth 2 (two) times daily., Disp: 180 tablet, Rfl: 1 .  B-D UF III MINI PEN NEEDLES 31G X 5 MM MISC, USE 2 PEN NEEDLE PER DAY WITH HUMALOG AND VICTOZA, Disp: 200 each, Rfl: 3 .  BD INSULIN SYRINGE U/F 31G X 5/16" 1 ML MISC, USE TO INJECT INSULIN DAILY. DX:E11.9, Disp: 100 each, Rfl: 1 .  ferrous sulfate 325 (65 FE) MG tablet, Take 325 mg by mouth 2 (two) times daily with a meal. , Disp: , Rfl:  .  glimepiride (AMARYL) 2 MG tablet, TAKE 1 TABLET DAILY BEFORE BREAKFAST, Disp: 90 tablet, Rfl: 3 .  HUMALOG KWIKPEN 100 UNIT/ML KwikPen, INJECT 12 UNITS INTO THE SKIN 2 (TWO) TIMES DAILY BEFORE LUNCH AND SUPPER. INJECT 12 UNITS UNDER SKIN BEFORE LUNCH AND DINNER. MAY ALSO INJECT 4 UNIT AT BREAKFAST IS SUGAR READING IS OVER 100., Disp: 15 mL, Rfl: 1 .  insulin NPH Human (HUMULIN N) 100 UNIT/ML injection, Inject 26 units of Humulin N under the skin once daily at bedtime. (Patient taking differently: Inject 26 units of  Humulin N under the skin once daily at bedtime.), Disp: 90 mL, Rfl: 1 .  liraglutide (VICTOZA) 18 MG/3ML SOPN, Inject 1.8 mg daily, Disp: 27 mL, Rfl: 3 .  metolazone (ZAROXOLYN) 2.5 MG tablet, Take 1 tablet (2.5 mg total) by mouth every Monday, Wednesday, and Friday., Disp: 39 tablet, Rfl: 3 .  Multiple Vitamin (MULTIVITAMIN WITH MINERALS) TABS tablet, Take 1 tablet by mouth daily. Mens One a day Vit, Disp: ,  Rfl:  .  Multiple Vitamins-Minerals (PRESERVISION AREDS 2 PO), Take 1 capsule by mouth 2 (two) times daily., Disp: , Rfl:  .  ondansetron (ZOFRAN) 4 MG tablet, Take 1 tablet (4 mg total) by mouth every 8 (eight) hours as needed for nausea or vomiting., Disp: 30 tablet, Rfl: 2 .  pantoprazole (PROTONIX) 40 MG tablet, TAKE 1 TABLET TWICE A DAY, Disp: 180 tablet, Rfl: 3 .  potassium chloride SA (KLOR-CON) 20 MEQ tablet, Take 2 tablets (40 mEq total) by mouth daily. Take 60 meq on Metolazone days (M,W, F), Disp: 192 tablet, Rfl: 3 .  pravastatin (PRAVACHOL) 80 MG tablet, TAKE 1 TABLET BY MOUTH AT BEDTIME FOR HYPERLIPIDEMIA, Disp: 30 tablet, Rfl: 6 .  tamsulosin (FLOMAX) 0.4 MG CAPS capsule, Take 1 capsule daily, Disp: 90 capsule, Rfl: 1 .  torsemide (DEMADEX) 20 MG tablet, Take 5 tablets (100 mg total) by mouth 2 (two) times daily., Disp: 300 tablet, Rfl: 11 Allergies  Allergen Reactions  . Ambien [Zolpidem Tartrate] Other (See Comments)    Severe agitation   . Other Other (See Comments)  . Bactrim [Sulfamethoxazole-Trimethoprim] Nausea And Vomiting and Other (See Comments)    Bleeding and ulcers  . Bactrim [Sulfamethoxazole-Trimethoprim]     Bleeding  . Morphine And Related     hypoxia  . Morphine And Related Nausea And Vomiting      Social History   Socioeconomic History  . Marital status: Married    Spouse name: Not on file  . Number of children: 4  . Years of education: Not on file  . Highest education level: Not on file  Occupational History  . Occupation: Construction-Retired  Tobacco Use  . Smoking status: Never Smoker  . Smokeless tobacco: Never Used  Vaping Use  . Vaping Use: Never used  Substance and Sexual Activity  . Alcohol use: Not Currently  . Drug use: Not Currently  . Sexual activity: Not Currently    Partners: Female  Other Topics Concern  . 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 Difficulty of Paying Living Expenses: Not very hard  Food Insecurity: No Food Insecurity  . Worried About Charity fundraiser in the Last Year: Never true  . Ran Out of Food in the Last Year: Never true  Transportation Needs: No Transportation Needs  . Lack of Transportation (Medical): No  . Lack of Transportation (Non-Medical): No  Physical Activity: Not on file  Stress: Not on file  Social Connections: Not on file  Intimate Partner Violence: Not on file    Physical Exam      Future Appointments  Date Time Provider [place]  01/28/2021  7:00 AM  [facility] DEVICE REMOTES [facility]  01/28/2021 10:00 AM [facility] PA/NP [facility] None  03/12/2021  3:45 PM Evans Lance, MD [facility]  04/29/2021  7:00 AM [facility] DEVICE REMOTES [facility]  07/29/2021  7:00 AM [facility] DEVICE REMOTES [facility]  10/28/2021  7:00 AM [facility] DEVICE REMOTES [facility]  01/27/2022  7:00 AM [facility] DEVICE REMOTES [facility]  04/28/2022  7:00 AM [facility] DEVICE REMOTES [facility]  07/28/2022  7:00 AM [facility] DEVICE REMOTES [facility]  10/27/2022  7:00 AM [facility] DEVICE REMOTES [facility]    Pulse 99   Wt 223 lb (101.2 kg)   SpO2 99%   BMI 34.93 kg/m  CBG PTA-98 Weight yesterday-222 Last visit weight-226 @ clinic  First visit with pt. Pt lives with wife in [place].  Uses [facility].  wife reports last night was a bad night. He felt so weak.  He states he feels the same as last week from dr Aundra Dubin visit.  He refused to wear the cpap now for the past several years. Has to sleep in recliner.  Unable to lay flat.  Breathing is same as last week.  Has all of his meds. Taking them correctly. Wife hasnt really noticed a change in his urine output.  Contacted triage nurse and she said since he is not getting any better then he needs to come to ER for admission to be diuresed and to start milrinone again.  No IV lasix at this time.   Marylouise Stacks, [facility] Paramedic  01/21/21

## 2021-01-21 NOTE — H&P (Addendum)
Advanced Heart Failure Team History and Physical Note   PCP:  Ma Hillock, DO  PCP-Cardiology: Sinclair Grooms, MD    AHFC: Dr. Aundra Dubin  EP: Dr. Lovena Le   Reason for Admission: Acute on Chronic Systolic Heart Failure   HPI:    Todd Romero is a 87 y.o. with a history of chronic systolic CHF, echo 3/15 showed reduced EF 35-40% with moderate LVH, RV normal. Also history of severe aortic stenosis status post TAVR in2018, symptomatic bradycardia/RBBBstatus post Medtronic permanent pacemaker implanted 10/2019, CAD, PAF, insulin-dependent diabetes, stage IVCKD, severe OSAintolerant toCPAP, hypertension, as well as history of carpal tunnel status post bilateralcarpal tunnel release.  Presented to the ED on 10/01/20. Markedly fluid overloaded.Started on IV lasix + empiric milrinone. His device was interrogated and showed he had been in persistent atrial fibrillation since 09/06/20 w/ 34% RV pacing. Underwent TEE guided DCCV back to NSR. TEE (10/07/20) with EF 25-30%, mildly decreased RV systolic function, normal function of TAVR valve. Hospital course complicated by AKI/urosepsis. Work up for amyloid was negative. Myeloma panel, urine IFXN, and PYP were negative.Will need followup with EP =>discussed with Dr. Quentin Ore, suspect he needed CRT given high percentage (>30%) RV pacing and a-pacing with very long PR interval. Discharged to SNF, [facility]. Discharge weight was 223 pounds.   He was sent to [facility] 10/19/20 for anxiety/tremors. CT of head was negative for hemorrhage. He was sent back to SNF.  Creatinine 1.8  And K 5.1.   On 11/21/20 he was referred to [facility] for volume overload. Overall he received 6 doses of IV lasix over the 4 days.   He was seen in the clinic 3/22 and he remained volume overloaded. [facility] was instructed to give  80 mg IV lasix x1 + 3/23 &3/24 5 mg metolazone once + 80 mg IV twice daily + 20 meq potassium.  On 12/01/20, he had  upgrade to MDT CRT-P device.   Had return [facility] f/u last week on 5/10.  He continued to struggle with the combination of CHF and CKD, feeling worse overall and weaker.  Using walker around the house due to "weakness."  BP was ok. He was short of breath walking about 100 feet.  Still with significant peripheral edema. Denied chest pain, no palpitations, no falls. He was fluid overloaded on exam and diuretic regimen titrated. Torsemide increased to 100 mg bid. Metolazone increased to 2.5 mg three times/week (MWF). KCl increased as well. He was also referred to paramedicine for monitoring and potential need for intermittent IV Lasix at home. Despite these measures, he has failed to improve. LE edema increasing w/ persistent weakness and exertional dyspnea. He has now been directly admitted for a/c systolic HF and treatment w/ IV diuretics.    Review of Systems: [y] = yes, [ ]  = no   . General: Weight gain [ Y]; Weight loss [ ] ; Anorexia [ ] ; Fatigue [ Y]; Fever [ ] ; Chills [ ] ; Weakness [Y ]  . Cardiac: Chest pain/pressure [ ] ; Resting SOB [Y ]; Exertional SOB [Y ]; Orthopnea [ ] ; Pedal Edema [ Y]; Palpitations [ ] ; Syncope [ ] ; Presyncope [ ] ; Paroxysmal nocturnal dyspnea[ ]   . Pulmonary: Cough [ ] ; Wheezing[ ] ; Hemoptysis[ ] ; Sputum [ ] ; Snoring [ ]   . GI: Vomiting[ ] ; Dysphagia[ ] ; Melena[ ] ; Hematochezia [ ] ; Heartburn[ ] ; Abdominal pain [ ] ; Constipation [ ] ; Diarrhea [ ] ; BRBPR [ ]   . GU: Hematuria[ ] ;  Dysuria [ ] ; Nocturia[ ]   . Vascular: Pain in legs with walking [ ] ; Pain in feet with lying flat [ ] ; Non-healing sores [ ] ; Stroke [ ] ; TIA [ ] ; Slurred speech [ ] ;  . Neuro: Headaches[ ] ; Vertigo[ ] ; Seizures[ ] ; Paresthesias[ ] ;Blurred vision [ ] ; Diplopia [ ] ; Vision changes [ ]   . Ortho/Skin: Arthritis [ ] ; Joint pain [ ] ; Muscle pain [ ] ; Joint swelling [ ] ; Back Pain [ ] ; Rash [ ]   . Psych: Depression[ ] ; Anxiety[ ]   . Heme: Bleeding problems [ ] ; Clotting disorders [ ] ; Anemia [ ]    . Endocrine: Diabetes [ ] ; Thyroid dysfunction[ ]    Home Medications Prior to Admission medications   Medication Sig Start Date End Date Taking? Authorizing Provider  ACCU-CHEK AVIVA PLUS test strip USE AS INSTRUCTED TO CHECK BLOOD SUGAR TWICE A DAY DX:E11.65 10/21/20   [name], MD  acetaminophen (TYLENOL) 500 MG tablet Take 500 mg by mouth every 4 (four) hours as needed for moderate pain or headache.    [provider]  amiodarone (PACERONE) 200 MG tablet Take 1 tablet (200 mg total) by mouth daily. 10/13/20   Lyda Jester M, PA-C  apixaban (ELIQUIS) 2.5 MG TABS tablet Take 1 tablet (2.5 mg total) by mouth 2 (two) times daily. 09/15/20   Belva Crome, MD  B-D UF III MINI PEN NEEDLES 31G X 5 MM MISC USE 2 PEN NEEDLE PER DAY WITH HUMALOG AND VICTOZA 06/24/20   [name], MD  BD INSULIN SYRINGE U/F 31G X 5/16" 1 ML MISC USE TO INJECT INSULIN DAILY. DX:E11.9 12/08/20   [name], MD  ferrous sulfate 325 (65 FE) MG tablet Take 325 mg by mouth 2 (two) times daily with a meal.     [provider]  glimepiride (AMARYL) 2 MG tablet TAKE 1 TABLET DAILY BEFORE BREAKFAST 07/03/20   [name], MD  HUMALOG KWIKPEN 100 UNIT/ML KwikPen INJECT 12 UNITS INTO THE SKIN 2 (TWO) TIMES DAILY BEFORE LUNCH AND SUPPER. INJECT 12 UNITS UNDER SKIN BEFORE LUNCH AND DINNER. MAY ALSO INJECT 4 UNIT AT BREAKFAST IS SUGAR READING IS OVER 100. 11/11/20   [name], MD  insulin NPH Human (HUMULIN N) 100 UNIT/ML injection Inject 26 units of Humulin N under the skin once daily at bedtime. Patient taking differently: Inject 26 units of Humulin N under the skin once daily at bedtime. 01/28/20   [name], MD  liraglutide (VICTOZA) 18 MG/3ML SOPN Inject 1.8 mg daily 09/09/20   [name], MD  metolazone (ZAROXOLYN) 2.5 MG tablet Take 1 tablet (2.5 mg total) by mouth every Monday, Wednesday, and Friday. 01/14/21   Larey Dresser, MD  Multiple Vitamin (MULTIVITAMIN WITH MINERALS) TABS tablet Take 1 tablet  by mouth daily. Mens One a day Vit    [provider]  Multiple Vitamins-Minerals (PRESERVISION AREDS 2 PO) Take 1 capsule by mouth 2 (two) times daily.    [provider]  ondansetron (ZOFRAN) 4 MG tablet Take 1 tablet (4 mg total) by mouth every 8 (eight) hours as needed for nausea or vomiting. 01/07/21   Kuneff, Renee A, DO  pantoprazole (PROTONIX) 40 MG tablet TAKE 1 TABLET TWICE A DAY 12/30/20   Belva Crome, MD  potassium chloride SA (KLOR-CON) 20 MEQ tablet Take 2 tablets (40 mEq total) by mouth daily. Take 60 meq on Metolazone days (M,W, F) 01/14/21   Larey Dresser, MD  pravastatin (PRAVACHOL) 80 MG tablet TAKE 1  TABLET BY MOUTH AT BEDTIME FOR HYPERLIPIDEMIA 01/14/21   Larey Dresser, MD  tamsulosin [facility]) 0.4 MG CAPS capsule Take 1 capsule daily 06/30/20   Kuneff, Renee A, DO  torsemide (DEMADEX) 20 MG tablet Take 5 tablets (100 mg total) by mouth 2 (two) times daily. 01/13/21   Larey Dresser, MD    Past Medical History: Past Medical History:  Diagnosis Date  . AKI (acute kidney injury) ([place]) 10/24/2019  . Anemia   . Arthritis   . CAD (coronary artery disease)    a. cardiac cath 12/2016 showing moderate AS, elevated LVEDP, heavy 3V coronary calcification, 100% mD2, 30-50% LAD, 50-90% stenosis of Cx proximal to origin of L-PDA, RCA not engaged due to poor catheter control (difficult procedure) - coronary status was essentially unchanged from prior.  . Chicken pox   . Chronic diastolic CHF (congestive heart failure) ([place])   . Colon polyps   . Degenerative arthritis   . Diabetes ([place])   . Diabetes mellitus without complication ([place])   . Dyspnea   . Dyspnea on exertion 02/11/2015  . Essential hypertension   . Heart disease   . Heme positive stool 11/16/2017  . Hyperlipidemia   . Intermediate stage nonexudative age-related macular degeneration of both eyes 02/27/2015  . Leukocytosis 11/16/2017  . New onset a-fib ([place]) 03/04/2018  . Obesity (BMI 30-39.9) 06/18/2015   . OSA (obstructive sleep apnea)    Severe with AHI 27/hr now on CPAP  not compliant with treatment.  . Peritonitis ([place]) 1985?  [name] Pneumonia    "6 months - 87 years old"  . Posterior vitreous detachment, left 03/02/2016  . Presence of permanent cardiac pacemaker   . Pseudophakia of both eyes 03/02/2016  . Pure hypercholesterolemia   . RBBB   . [facility] spotted fever   . 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 Severe aortic stenosis    a. s/p TAVR 03/2017.  [name] Symptomatic bradycardia 10/24/2019  . Type II or unspecified type diabetes mellitus without mention of complication, not stated as uncontrolled   . Varicose vein of leg     Past Surgical History: Past Surgical History:  Procedure Laterality Date  . APPENDECTOMY  1985   peritonitis  . BIV UPGRADE N/A 12/01/2020   Procedure: BIV UPGRADE;  Surgeon: Evans Lance, MD;  Location: [place] CV LAB;  Service: Cardiovascular;  Laterality: N/A;  . CARDIAC SURGERY    . CARDIOVERSION N/A 04/03/2018   Procedure: CARDIOVERSION;  Surgeon: Josue Hector, MD;  Location: [facility] ENDOSCOPY;  Service: Cardiovascular;  Laterality: N/A;  . CARDIOVERSION N/A 01/18/2019   Procedure: CARDIOVERSION;  Surgeon: Skeet Latch, MD;  Location: [place];  Service: Cardiovascular;  Laterality: N/A;  . CARDIOVERSION N/A 10/07/2020   Procedure: CARDIOVERSION;  Surgeon: Larey Dresser, MD;  Location: [facility] ENDOSCOPY;  Service: Cardiovascular;  Laterality: N/A;  . CARPAL TUNNEL RELEASE    . COLONOSCOPY WITH PROPOFOL N/A 11/18/2017   Procedure: COLONOSCOPY WITH PROPOFOL;  Surgeon: Carol Ada, MD;  Location: [facility] ENDOSCOPY;  Service: Endoscopy;  Laterality: N/A;  . ESOPHAGOGASTRODUODENOSCOPY N/A 11/18/2017   Procedure: ESOPHAGOGASTRODUODENOSCOPY (EGD);  Surgeon: Carol Ada, MD;  Location: [facility] ENDOSCOPY;  Service: Endoscopy;   Laterality: N/A;  . EYE SURGERY Bilateral    cataracts removed, lens placed  . JOINT REPLACEMENT    . KNEE CARTILAGE SURGERY    . KNEE SURGERY  Right   . PACEMAKER IMPLANT N/A 10/25/2019   Procedure: PACEMAKER IMPLANT;  Surgeon: Evans Lance, MD;  Location: [place] CV LAB;  Service: Cardiovascular;  Laterality: N/A;  . PACEMAKER IMPLANT    . PARTIAL COLECTOMY    . RIGHT/LEFT HEART CATH AND CORONARY ANGIOGRAPHY N/A 12/28/2016   Procedure: Right/Left Heart Cath and Coronary Angiography;  Surgeon: Belva Crome, MD;  Location: [place] CV LAB;  Service: Cardiovascular;  Laterality: N/A;  . TEE WITHOUT CARDIOVERSION N/A 03/15/2017   Procedure: TRANSESOPHAGEAL ECHOCARDIOGRAM (TEE);  Surgeon: Burnell Blanks, MD;  Location: [place];  Service: Open Heart Surgery;  Laterality: N/A;  . TEE WITHOUT CARDIOVERSION N/A 10/07/2020   Procedure: TRANSESOPHAGEAL ECHOCARDIOGRAM (TEE);  Surgeon: Larey Dresser, MD;  Location[facility] ENDOSCOPY;  Service: Cardiovascular;  Laterality: N/A;  . TONSILLECTOMY AND ADENOIDECTOMY  1942  . TOTAL KNEE ARTHROPLASTY Right 11/08/2017  . TOTAL KNEE ARTHROPLASTY Right 11/08/2017   Procedure: TOTAL KNEE ARTHROPLASTY;  Surgeon: Melrose Nakayama, MD;  Location: [place];  Service: Orthopedics;  Laterality: Right;  . TRANSCATHETER AORTIC VALVE REPLACEMENT, TRANSFEMORAL N/A 03/15/2017   Procedure: TRANSCATHETER AORTIC VALVE REPLACEMENT, TRANSFEMORAL;  Surgeon: Burnell Blanks, MD;  Location: [place];  Service: Open Heart Surgery;  Laterality: N/A;  . VEIN SURGERY  1980    Family History: Family History  Problem Relation Age of Onset  . Heart failure Father   . Emphysema Father   . COPD Father   . Early death Father   . Heart attack Father   . Hypertension Mother   . Early death Mother   . Hyperlipidemia Mother   . Stroke Mother   . Cancer Sister   . Hypertension Sister   . Healthy Sister     Social History: Social History   Socioeconomic History  .  Marital status: Married    Spouse name: Not on file  . Number of children: 4  . Years of education: Not on file  . Highest education level: Not on file  Occupational History  . Occupation: Construction-Retired  Tobacco Use  . Smoking status: Never Smoker  . Smokeless tobacco: Never Used  Vaping Use  . Vaping Use: Never used  Substance and Sexual Activity  . Alcohol use: Not Currently  . Drug use: Not Currently  . Sexual activity: Not Currently    Partners: Female  Other Topics Concern  . Not on file  Social History Narrative   ** Merged History Encounter **       Marital status/children/pets: Married 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, retired Recruitment consultant:    -smoke alarm in the home:Yes   - wears seatbelt: Yes   - Feels safe in their relationships: Yes    Social Determinants of Radio broadcast assistant Strain: Low Risk   . Difficulty of Paying Living Expenses: Not very hard  Food Insecurity: No Food Insecurity  . Worried About Charity fundraiser in the Last Year: Never true  . Ran Out of Food in the Last Year: Never true  Transportation Needs: No Transportation Needs  . Lack of Transportation (Medical): No  . Lack of Transportation (Non-Medical): No  Physical Activity: Not on file  Stress: Not on file  Social Connections: Not on file    Allergies:  Allergies  Allergen Reactions  . Ambien [Zolpidem Tartrate] Other (See Comments)    Severe agitation   . Other Other (See Comments)  . Bactrim [Sulfamethoxazole-Trimethoprim] Nausea And Vomiting and Other (See Comments)  Bleeding and ulcers  . Bactrim [Sulfamethoxazole-Trimethoprim]     Bleeding  . Morphine And Related     hypoxia  . Morphine And Related Nausea And Vomiting    Objective:    Vital Signs:   Temp:  [98 F (36.7 C)] 98 F (36.7 C) (05/18 1639) Pulse Rate:  [99] 99 (05/18 1032) Resp:  [13] 13 (05/18 1639) BP: (108)/(70) 108/70 (05/18 1639) SpO2:  [96 %-99 %] 96 % (05/18  1639) Weight:  [101.2 kg-102.1 kg] 102.1 kg (05/18 1639)   Filed Weights   01/21/21 1639  Weight: 102.1 kg     Physical Exam     General:  Moderately obese, elderly male No respiratory difficulty HEENT: hard of hearing, otherwise normal  Neck: Supple. JVD elevated. Carotids 2+ bilat; no bruits. No lymphadenopathy or thyromegaly appreciated. Cor: PMI nondisplaced. Regular rate & rhythm. No rubs, gallops or murmurs. Lungs: Clear Abdomen: Soft, nontender, nondistended. No hepatosplenomegaly. No bruits or masses. Good bowel sounds. Extremities: No cyanosis, clubbing, rash, 2+ bilateral LE up to knees Neuro: Alert & oriented x 3, cranial nerves grossly intact. moves all 4 extremities w/o difficulty. Affect pleasant.   Telemetry   A paced 75 bpm   EKG   12 lead pending   Labs     Basic Metabolic Panel: Recent Labs  Lab 01/20/21 1101  NA 133*  K 3.4*  CL 90*  CO2 29  GLUCOSE 120*  BUN 92*  CREATININE 2.70*  CALCIUM 8.5*    Liver Function Tests: No results for input(s): AST, ALT, ALKPHOS, BILITOT, PROT, ALBUMIN in the last 168 hours. No results for input(s): LIPASE, AMYLASE in the last 168 hours. No results for input(s): AMMONIA in the last 168 hours.  CBC: No results for input(s): WBC, NEUTROABS, HGB, HCT, MCV, PLT in the last 168 hours.  Cardiac Enzymes: No results for input(s): CKTOTAL, CKMB, CKMBINDEX, TROPONINI in the last 168 hours.  BNP: BNP (last 3 results) Recent Labs    10/01/20 1710  BNP 433.6*    ProBNP (last 3 results) Recent Labs    01/30/20 1351 11/06/20 1218  PROBNP 1,776* 630.0*     CBG: No results for input(s): GLUCAP in the last 168 hours.  Coagulation Studies: No results for input(s): LABPROT, INR in the last 72 hours.  Imaging: [place] EKG SITE RITE  Result Date: 01/21/2021 If Site Rite image not attached, placement could not be confirmed due to current cardiac rhythm.    Patient Profile   87 y/o male w/ h/o chronic  systolic heart failure 2/2 mixed ischemic/nonischemic cardiomyopathy, recent echo 2/22 LVEF 25-30% RV mildly reduced, PAF on Eliquis, AS s/p TAVR in 2018, CAD, bradycardia s/p PPM w/ recent upgrade to CRT-P 3/22 and Stage IV CKD.  Recent admit 1/22 w/ a/c CHF in setting of persistent Afib, underwent DCCV, diuresed w/ IV lasix and required milrinone during admission.  Now readmitted w/ recurrent a/c CHF w/ marked fluid overload and NYHA Class IIIb symptoms, failing escalation of home diuretics.   Assessment/Plan   1. Acute on Chronic Systolic CHF: Suspect mixed ischemic/nonischemic cardiomyopathy.  Atrial fibrillation and excessive RV pacing may contribute to cardiomyopathy. Last cath in 2018 pre-TAVR with occluded D2 and 90% stenosis left PDA. He had MDT PPM for symptomatic bradycardia in 2/21, upgraded to CRT-P in 3/22 due to high percentage of RV pacing. TEE (2/22) with EF 25-30%, mildly decreased RV systolic function, normal function of TAVR valve.  PYP scan not suggestive of TTR cardiac amyloidosis.  He required milrinone during 2/22 admission.  CHF is complicated by CKD stage IV.  Continues to struggle w/ marked fluid overload and NYHA Class IIIb symptoms, failing recent escalation of home diuretics. Plan to admit for IV Lasix and milrinone to help w/ diuresis.  - IV Lasix 80 mg bolus x 1 followed by gtt at 12/hr - Place PICC. CVP monitoring + daily co-oxes   - Start Milrinone 0.25 mcg/kg/min. Monitor for Afib. May need IV amio while on inotrope.   - Daily BMPs  - No ARNI, spironolactone with CKD stage IV.  - He did not tolerate [place] - Place UNNA boots  - Avoiding beta blocker with marked volume overload and concern for low output HF requiring milrinone  - Not candidate for advanced therapies with age and CKD stage IV.  2. Atrial fibrillation: Paroxysmal. TEE-DCCV on 10/07/20 initially converted him to NSR but back to atrial fibrillation soon after. Converted back to NSR on 10/08/20 with  amiodarone. In NSR at clinic f/u 5/10.  - Obtain 12 lead EKG and place on continuous telemetry monitoring  - Continue amio 200 mg daily.  Recent LFTs/TSH normal, will need regular eye exam.  - Continue Eliquis 2.5 mg bid. There has been consideration for Watchman, but he is not falling frequently and does not have GI bleeding history.  Given CKD and uncontrolled CHF, would avoid the additional procedure.  3. CAD: Cath in 2018 pre-TAVR with occluded D2 and 90% stenosis left PDA, medically managed.  No chest pain.  - On Eliquis so no aspirin.  - Continue pravastatin, good lipids in 4/22.  4. CKD Stage IV - Follow BMP daily w/ diuresis  - followed by outpatient nephrology.  5. Bradycardia --> PPM Medtronic --> upgraded to CRT-P on 12/01/20  Lyda Jester, PA-C 01/21/2021, 4:45 PM  Advanced Heart Failure Team Pager [phone] (M-F; 7a - 5p)  Please contact [facility] for night-coverage after hours (4p -7a ) and weekends on [email or web address]  Patient seen with PA, agree with the above note.   He has struggled with systolic CHF and cardiorenal syndrome.  Most recent creatinine 2.7 with BUN 92.  Despite high doses of diuretics at home, he has developed progressive peripheral edema and exertional dyspnea.  Seen by paramedicine today, recommended that he be admitted.   General: NAD Neck: JVP 16 mc, no thyromegaly or thyroid nodule.  Lungs: Decreased BS at bases.  CV: Nonpalpable PMI.  Heart regular S1/S2, no S3/S4, no murmur.  2+ edema to thighs.  No carotid bruit.  Unable to palpate pedal pulses.  Abdomen: Soft, nontender, no hepatosplenomegaly, mild distention.  Skin: Intact without lesions or rashes.  Neurologic: Alert and oriented x 3.  Psych: Normal affect. Extremities: No clubbing or cyanosis.  HEENT: Normal.   Patient returns for marked volume overload in setting of CKD stage IV/cardiorenal syndrome.  Creatinine around baseline at 2.7.  He has failed high dose oral diuretics at home.  Last admission, required milrinone infusion.  - Place PICC to follow CVP and co-ox (poor HD candidate with age, advanced HF).  - Start milrinone 0.25 mcg/kg/min.  - Lasix 80 mg IV then 12 mg/hr.  - Continue po amiodarone for now and apixaban.  ECG to confirm rhythm.   Poor long-term prognosis at this point.  Not candidate for advanced therapies with age and CKD IV.  Poor response to diuretics at home, may end up needing home milrinone for palliation but will follow.   [name] 01/21/2021 5:12 PM

## 2021-01-22 ENCOUNTER — Other Ambulatory Visit: Payer: Self-pay

## 2021-01-22 ENCOUNTER — Inpatient Hospital Stay (HOSPITAL_COMMUNITY): Payer: Medicare Other

## 2021-01-22 ENCOUNTER — Encounter (HOSPITAL_COMMUNITY): Payer: Self-pay | Admitting: Cardiology

## 2021-01-22 LAB — BASIC METABOLIC PANEL
Anion gap: 10 (ref 5–15)
BUN: 89 mg/dL — ABNORMAL HIGH (ref 8–23)
CO2: 28 mmol/L (ref 22–32)
Calcium: 8.5 mg/dL — ABNORMAL LOW (ref 8.9–10.3)
Chloride: 93 mmol/L — ABNORMAL LOW (ref 98–111)
Creatinine, Ser: 2.88 mg/dL — ABNORMAL HIGH (ref 0.61–1.24)
GFR, Estimated: 21 mL/min — ABNORMAL LOW (ref >60)
Glucose, Bld: 157 mg/dL — ABNORMAL HIGH (ref 70–99)
Potassium: 3.7 mmol/L (ref 3.5–5.1)
Sodium: 131 mmol/L — ABNORMAL LOW (ref 135–145)

## 2021-01-22 LAB — GLUCOSE, CAPILLARY
Glucose-Capillary: 118 mg/dL — ABNORMAL HIGH (ref 70–99)
Glucose-Capillary: 232 mg/dL — ABNORMAL HIGH (ref 70–99)
Glucose-Capillary: 219 mg/dL — ABNORMAL HIGH (ref 70–99)
Glucose-Capillary: 249 mg/dL — ABNORMAL HIGH (ref 70–99)

## 2021-01-22 LAB — COOXEMETRY PANEL
Carboxyhemoglobin: 0.4 % — ABNORMAL LOW (ref 0.5–1.5)
Methemoglobin: 0.7 % (ref 0.0–1.5)
O2 Saturation: 61.9 %
Total hemoglobin: 8.1 g/dL — ABNORMAL LOW (ref 12.0–16.0)

## 2021-01-22 MED ORDER — SODIUM CHLORIDE 0.9% FLUSH: 10.0000 mL | INTRAVENOUS | Status: DC | PRN

## 2021-01-22 MED ORDER — METOLAZONE 2.5 MG PO TABS
2.5000 mg | ORAL_TABLET | Freq: Once | ORAL | Status: AC
  Administered 2021-01-22: 2.5 mg via ORAL
  Filled 2021-01-22: qty 1

## 2021-01-22 MED ORDER — TRAZODONE HCL 50 MG PO TABS
50.0000 mg | ORAL_TABLET | Freq: Every evening | ORAL | Status: DC | PRN
  Administered 2021-01-22 – 2021-01-28 (×5): 50 mg via ORAL
  Filled 2021-01-22 (×5): qty 1

## 2021-01-22 MED ORDER — SODIUM CHLORIDE 0.9% FLUSH
10.0000 mL | Freq: Two times a day (BID) | INTRAVENOUS | Status: DC
  Administered 2021-01-23 – 2021-01-28 (×8): 10 mL

## 2021-01-22 MED ORDER — CHLORHEXIDINE GLUCONATE CLOTH 2 % EX PADS
6.0000 | MEDICATED_PAD | Freq: Every day | CUTANEOUS | Status: DC
  Administered 2021-01-22 – 2021-01-29 (×8): 6 via TOPICAL

## 2021-01-22 MED ORDER — INSULIN NPH (HUMAN) (ISOPHANE) 100 UNIT/ML ~~LOC~~ SUSP
26.0000 [IU] | Freq: Every day | SUBCUTANEOUS | Status: DC
  Administered 2021-01-22 – 2021-01-27 (×6): 26 [IU] via SUBCUTANEOUS
  Filled 2021-01-22: qty 10

## 2021-01-22 MED ORDER — HYDROXYZINE HCL 25 MG PO TABS
25.0000 mg | ORAL_TABLET | Freq: Four times a day (QID) | ORAL | Status: DC | PRN
  Administered 2021-01-22 – 2021-01-29 (×13): 25 mg via ORAL
  Filled 2021-01-22 (×13): qty 1

## 2021-01-22 MED ORDER — POTASSIUM CHLORIDE CRYS ER 20 MEQ PO TBCR
40.0000 meq | EXTENDED_RELEASE_TABLET | Freq: Once | ORAL | Status: AC
  Administered 2021-01-22: 40 meq via ORAL
  Filled 2021-01-22: qty 2

## 2021-01-22 NOTE — Progress Notes (Addendum)
Advanced Heart Failure Rounding Note  PCP-Cardiologist: Sinclair Grooms, MD  AHFC: Dr. Aundra Dubin EP: Dr. 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  Recent admit 1/22 w/ a/c CHF in setting of persistent Afib, underwent DCCV, diuresed w/ IV lasix and required milrinone during admission.  Now readmitted w/ recurrent a/c CHF w/ marked fluid overload and NYHA Class IIIb symptoms, failing escalation of home diuretics.    Subjective:    Still waiting on PICC placement. Milrinone started peripherally, on 0.25 + Lasix gtt at 12/hr.   - 1L in UOP last PM. Wt down 2 lb.    Scr up 2.70>>2.75>>2.88. SBPs low 100s   K 3.7 Na 131   AV paced on tele.   Sitting up on side of bed. No resting dyspnea. Wife present at bedside. Reports he was restless last night. Anxious and poor sleep.    Objective:   Weight Range: 101.2 kg Body mass index is 34.94 kg/m.   Vital Signs:   Temp:  [97.8 F (36.6 C)-98.6 F (37 C)] 98.4 F (36.9 C) (05/19 0304) Pulse Rate:  [72-99] 72 (05/18 1948) Resp:  [13-22] 15 (05/19 0304) BP: (103-108)/(52-70) 106/52 (05/19 0304) SpO2:  [92 %-99 %] 94 % (05/19 0304) Weight:  [101.2 kg-102.1 kg] 101.2 kg (05/19 0500)    Weight change: Filed Weights   01/21/21 1639 01/22/21 0500  Weight: 102.1 kg 101.2 kg    Intake/Output:   Intake/Output Summary (Last 24 hours) at 01/22/2021 0851 Last data filed at 01/22/2021 0600 Gross per 24 hour  Intake 873.91 ml  Output 1050 ml  Net -176.09 ml      Physical Exam    General: fatigue appearing elderly WM. No resp difficulty HEENT: Normal Neck: Supple. JVP elevated to jaw . Carotids 2+ bilat; no bruits. No lymphadenopathy or thyromegaly appreciated. Cor: PMI  nondisplaced. Regular rate & rhythm. No rubs, gallops or murmurs. Lungs: Clear Abdomen: obese, Soft, nontender, nondistended. No hepatosplenomegaly. No bruits or masses. Good bowel sounds. Extremities: No cyanosis, clubbing, rash, 1+ bilateral LE up to knees/thighs + bilateral unna boots  Neuro: Alert & orientedx3, cranial nerves grossly intact. moves all 4 extremities w/o difficulty. Affect pleasant   Telemetry   AV-Paced 70s   EKG    AV dual paced 69 bpm   Labs    CBC Recent Labs    01/21/21 1821  WBC 6.3  NEUTROABS 4.2  HGB 9.9*  HCT 30.7*  MCV 90.8  PLT 793   Basic Metabolic Panel Recent Labs    01/21/21 1821 01/22/21 0056  NA 132* 131*  K 3.5 3.7  CL 93* 93*  CO2 30 28  GLUCOSE 119* 157*  BUN 89* 89*  CREATININE 2.75* 2.88*  CALCIUM 8.5* 8.5*  MG 2.5*  --    Liver Function Tests Recent Labs    01/21/21 1821  AST 27  ALT 17  ALKPHOS 76  BILITOT 0.6  PROT 6.2*  ALBUMIN 3.1*   No results for input(s): LIPASE, AMYLASE in the last 72 hours. Cardiac Enzymes No results for input(s): CKTOTAL, CKMB, CKMBINDEX, TROPONINI in the last 72 hours.  BNP: BNP (last 3 results) Recent Labs    10/01/20 1710 01/21/21 1821  BNP 433.6* 1,245.4*  ProBNP (last 3 results) Recent Labs    01/30/20 1351 11/06/20 1218  PROBNP 1,776* 630.0*     D-Dimer No results for input(s): DDIMER in the last 72 hours. Hemoglobin A1C Recent Labs    01/21/21 1821  HGBA1C 7.2*   Fasting Lipid Panel No results for input(s): CHOL, HDL, LDLCALC, TRIG, CHOLHDL, LDLDIRECT in the last 72 hours. Thyroid Function Tests No results for input(s): TSH, T4TOTAL, T3FREE, THYROIDAB in the last 72 hours.  Invalid input(s): FREET3  Other results:   Imaging    DG Chest 2 View  Result Date: 01/22/2021 CLINICAL DATA:  CHF. EXAM: CHEST - 2 VIEW COMPARISON:  12/01/2020. FINDINGS: AICD in stable position. Prior cardiac valve replacement. Cardiomegaly. Diffuse mild bilateral  interstitial prominence. Tiny bilateral pleural effusions can not be excluded. Findings suggest mild CHF. No pneumothorax. IMPRESSION: 1. AICD in stable position. Prior cardiac valve replacement. Cardiomegaly with diffuse mild bilateral interstitial prominence. Tiny bilateral pleural effusions cannot be excluded. Findings suggest mild CHF. Electronically Signed   By: Marcello Moores  Register   On: 01/22/2021 07:02   [place] EKG SITE RITE  Result Date: 01/21/2021 If Site Rite image not attached, placement could not be confirmed due to current cardiac rhythm.     Medications:     Scheduled Medications: . amiodarone  200 mg Oral Daily  . apixaban  2.5 mg Oral BID  . ferrous sulfate  325 mg Oral BID WC  . glimepiride  2 mg Oral QAC breakfast  . insulin aspart  0-15 Units Subcutaneous TID WC  . pantoprazole  40 mg Oral BID  . potassium chloride SA  40 mEq Oral Daily  . pravastatin  80 mg Oral q1800  . sodium chloride flush  3 mL Intravenous Q12H  . tamsulosin  0.4 mg Oral QPC supper     Infusions: . sodium chloride    . furosemide (LASIX) 200 mg in dextrose 5% 100 mL (2mg /mL) infusion 12 mg/hr (01/21/21 1901)  . milrinone 0.25 mcg/kg/min (01/22/21 0430)     PRN Medications:  sodium chloride, acetaminophen, ondansetron (ZOFRAN) IV, sodium chloride flush     Assessment/Plan   1. Acute on Chronic Systolic CHF: Suspect mixed ischemic/nonischemic cardiomyopathy.  Atrial fibrillation and excessive RV pacing may contribute to cardiomyopathy. Last cath in 2018 pre-TAVR with occluded D2 and 90% stenosis left PDA. He had MDT PPM for symptomatic bradycardia in 2/21, upgraded to CRT-P in 3/22 due to high percentage of RV pacing. TEE (2/22) with EF 25-30%, mildly decreased RV systolic function, normal function of TAVR valve.  PYP scan not suggestive of TTR cardiac amyloidosis.  He required milrinone during 2/22 admission.  CHF is complicated by CKD stage IV.  Continues to struggle w/ marked fluid  overload and NYHA Class IIIb symptoms, failing recent escalation of home diuretics. Admitted for IV diuretics and milrinone to help w/ diuresis.  - Continue milrinone 0.25 peripherally while waiting on PICC - Will follow CVPs and Co-ox post PICC placement  - Continue Lasix gtt at 12/hr and give 2.5 of metolazone x 1  - No ARNI, spironolactone with CKD stage IV.  - He did not tolerate Wilder Glade - c/w UNNA boots  - Avoiding beta blocker with marked volume overload and concern for low output HF requiring milrinone  - Not candidate for advanced therapies with age and CKD stage IV.  2. Atrial fibrillation: Paroxysmal. TEE-DCCV on 10/07/20 initially converted him to NSR but back to atrial fibrillation soon after. Converted back to NSR on 10/08/20  with amiodarone. AV-dual paced on tele/ admit EKG  - Continue amio 200 mg daily.  Recent LFTs/TSH normal, will need regular eye exam.  - Continue Eliquis 2.5 mg bid. There has been consideration for Watchman, but he is not falling frequently and does not have GI bleeding history.  Given CKD and uncontrolled CHF, would avoid the additional procedure.  3. CAD: Cath in 2018 pre-TAVR with occluded D2 and 90% stenosis left PDA, medically managed.  No chest pain.  - On Eliquis so no aspirin.  - Continue pravastatin, good lipids in 4/22.  4. CKD Stage IV: baseline SCr 2-3 range - 2.9 today.  Follow BMP daily w/ diuresis  - followed by outpatient nephrology.  5. Bradycardia --> PPM [employer] --> upgraded to CRT-P on 12/01/20 6. Anxiety: avoid xanax as he had SEs in the past.  - Trial of hydroxyzine  - Trazodone qhs prn  Length of Stay: [place], PA-C  01/22/2021, 8:51 AM  Advanced Heart Failure Team Pager [phone] (M-F; 7a - 5p)  Please contact [facility] for night-coverage after hours (5p -7a ) and weekends on [email or web address]  Patient seen with PA, agree with the above note.    Weight down 2 lbs.  Had a difficult night, orthopneic and anxious.     PICC not placed yet. Creatinine 2.75 => 2.88.  He remains on milrinone 0.25 + Lasix gtt 12 mg/hr.   General: NAD Neck: JVP 16 cm, no thyromegaly or thyroid nodule.  Lungs: Clear to auscultation bilaterally with normal respiratory effort. CV: Nondisplaced PMI.  Heart regular S1/S2, no S3/S4, 2/6 SEM.  2+ edema to thighs. Abdomen: Soft, nontender, no hepatosplenomegaly, no distention.  Skin: Intact without lesions or rashes.  Neurologic: Alert and oriented x 3.  Psych: Normal affect. Extremities: No clubbing or cyanosis.  HEENT: Normal.   Will try to get PICC placed soon to follow CVP/co-ox.  For now, continue empiric milrinone 0.25 (required in past due to low output HF) and increase Lasix gtt to 15 mg/hr.  Will give dose of metolazone 2.5 x 1 today. Creatinine fairly stable but will need to watch closely with diuresis.   He is A-V sequentially paced.  Continue amiodarone po and Eliquis.   [name] 01/22/2021 10:08 AM

## 2021-01-22 NOTE — Progress Notes (Signed)
Mobility Specialist - Progress Note   01/22/21 1106  Mobility  Activity Ambulated in hall  Level of Assistance Minimal assist, patient does 75% or more  Assistive Device Front wheel walker  Distance Ambulated (ft) 90 ft  Mobility Ambulated with assistance in hallway  Mobility Response Tolerated well  Mobility performed by Mobility specialist  $Mobility charge 1 Mobility   Pre-mobility: 70 HR, 103/86 BP During mobility: 88 HR Post-mobility: 74 HR, 116/60 BP  Pt min assist to stand from recliner, min guard during ambulation. Distance limited by fatigue. Pt to recliner after walk, chair alarm on and call bell at side.   Pricilla Handler Mobility Specialist Mobility Specialist Phone: [phone]

## 2021-01-22 NOTE — Plan of Care (Signed)

## 2021-01-22 NOTE — Progress Notes (Signed)
Mobility Specialist - Progress Note   01/22/21 1335  Mobility  Activity Transferred:  Chair to bed  Level of Assistance Minimal assist, patient does 75% or more  Assistive Device Front wheel walker  Distance Ambulated (ft) 5 ft  Mobility Ambulated with assistance in room  Mobility Response Tolerated well  Mobility performed by Mobility specialist  $Mobility charge 1 Mobility   Pt min assist to stand from recliner after 2 trials. Pt transferred to bed in order to prepare to get PICC line placed. VSS throughout.  Pricilla Handler Mobility Specialist Mobility Specialist Phone: [phone]

## 2021-01-22 NOTE — Progress Notes (Signed)
Peripherally Inserted Central Catheter Placement  The IV Nurse has discussed with the patient and/or persons authorized to consent for the patient, the purpose of this procedure and the potential benefits and risks involved with this procedure.  The benefits include less needle sticks, lab draws from the catheter, and the patient may be discharged home with the catheter. Risks include, but not limited to, infection, bleeding, blood clot (thrombus formation), and puncture of an artery; nerve damage and irregular heartbeat and possibility to perform a PICC exchange if needed/ordered by physician.  Alternatives to this procedure were also discussed.  Bard Power PICC patient education guide, fact sheet on infection prevention and patient information card has been provided to patient /or left at bedside.    PICC Placement Documentation  PICC Double Lumen 05/26/79 PICC Right Basilic 39 cm 0 cm (Active)  Indication for Insertion or Continuance of Line Vasoactive infusions 01/22/21 1425  Exposed Catheter (cm) 0 cm 01/22/21 1425  Site Assessment Clean;Dry;Intact 01/22/21 1425  Lumen #1 Status Flushed;Blood return noted;Saline locked 01/22/21 1425  Lumen #2 Status Flushed;Blood return noted;Saline locked 01/22/21 1425  Dressing Type Transparent 01/22/21 1425  Dressing Status Clean;Dry;Intact 01/22/21 1425  Antimicrobial disc in place? Yes 01/22/21 1425  Dressing Change Due 01/29/21 01/22/21 1425       Todd Romero 01/22/2021, 2:38 PM

## 2021-01-23 LAB — BASIC METABOLIC PANEL
Anion gap: 9 (ref 5–15)
BUN: 79 mg/dL — ABNORMAL HIGH (ref 8–23)
CO2: 29 mmol/L (ref 22–32)
Calcium: 8.1 mg/dL — ABNORMAL LOW (ref 8.9–10.3)
Chloride: 91 mmol/L — ABNORMAL LOW (ref 98–111)
Creatinine, Ser: 2.8 mg/dL — ABNORMAL HIGH (ref 0.61–1.24)
GFR, Estimated: 22 mL/min — ABNORMAL LOW (ref >60)
Glucose, Bld: 154 mg/dL — ABNORMAL HIGH (ref 70–99)
Potassium: 3 mmol/L — ABNORMAL LOW (ref 3.5–5.1)
Sodium: 129 mmol/L — ABNORMAL LOW (ref 135–145)

## 2021-01-23 LAB — GLUCOSE, CAPILLARY
Glucose-Capillary: 136 mg/dL — ABNORMAL HIGH (ref 70–99)
Glucose-Capillary: 159 mg/dL — ABNORMAL HIGH (ref 70–99)
Glucose-Capillary: 208 mg/dL — ABNORMAL HIGH (ref 70–99)
Glucose-Capillary: 225 mg/dL — ABNORMAL HIGH (ref 70–99)

## 2021-01-23 LAB — COOXEMETRY PANEL
Carboxyhemoglobin: 0.3 % — ABNORMAL LOW (ref 0.5–1.5)
Methemoglobin: 0.6 % (ref 0.0–1.5)
O2 Saturation: 67.9 %
Total hemoglobin: 9.5 g/dL — ABNORMAL LOW (ref 12.0–16.0)

## 2021-01-23 LAB — MAGNESIUM: Magnesium: 2.2 mg/dL (ref 1.7–2.4)

## 2021-01-23 MED ORDER — POLYETHYLENE GLYCOL 3350 17 G PO PACK
17.0000 g | PACK | Freq: Every day | ORAL | Status: DC | PRN
  Administered 2021-01-23: 17 g via ORAL

## 2021-01-23 MED ORDER — POLYETHYLENE GLYCOL 3350 17 G PO PACK
17.0000 g | PACK | Freq: Once | ORAL | Status: AC
  Administered 2021-01-23: 17 g via ORAL
  Filled 2021-01-23: qty 1

## 2021-01-23 MED ORDER — DOCUSATE SODIUM 100 MG PO CAPS
100.0000 mg | ORAL_CAPSULE | Freq: Two times a day (BID) | ORAL | Status: DC
  Administered 2021-01-23 – 2021-01-29 (×12): 100 mg via ORAL
  Filled 2021-01-23 (×13): qty 1

## 2021-01-23 MED ORDER — POTASSIUM CHLORIDE CRYS ER 20 MEQ PO TBCR
60.0000 meq | EXTENDED_RELEASE_TABLET | ORAL | Status: AC
  Administered 2021-01-23 (×3): 60 meq via ORAL
  Filled 2021-01-23 (×3): qty 3

## 2021-01-23 MED ORDER — METOLAZONE 2.5 MG PO TABS
5.0000 mg | ORAL_TABLET | Freq: Once | ORAL | Status: AC
  Administered 2021-01-23: 5 mg via ORAL
  Filled 2021-01-23: qty 2

## 2021-01-23 NOTE — Care Management Important Message (Signed)
Important Message  Patient Details  Name: Todd Romero MRN: [id number] Date of Birth: 11/22/1933   Medicare Important Message Given:  Yes     Orbie Pyo 01/23/2021, 2:01 PM

## 2021-01-23 NOTE — Evaluation (Signed)
Occupational Therapy Evaluation Patient Details Name: Todd Romero MRN: [id number] DOB: [birth year] Today's Date: 01/23/2021    History of Present Illness 87 y/o male admitted 5/18 with edema, weakness and dyspnea with CHF exacerbation. 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   Clinical Impression   Pt was walking with a cane or RW, standing to shower with supervision and assisted with LE bathing and dressing prior to admission. He recently completely rehab in a SNF followed by [facility] and continues to do UE exercise HEP. Pt presents with generalized weakness and impaired standing balance requiring up to min assist for mobility. Recommended seated showering for energy conservation, but pt declined. Will follow acutely.     Follow Up Recommendations  No OT follow up    Equipment Recommendations   (pt is declining a shower seat)    Recommendations for Other Services       Precautions / Restrictions Precautions Precautions: Fall      Mobility Bed Mobility Overal bed mobility: Needs Assistance Bed Mobility: Supine to Sit;Sit to Supine     Supine to sit: Min assist;HOB elevated Sit to supine: Min guard   General bed mobility comments: assist to raise trunk, use of rail, able to get his LEs back into bed without assist, increased time, pt does not typically sleep in bed    Transfers Overall transfer level: Needs assistance Equipment used: Rolling walker (2 wheeled) Transfers: Sit to/from Stand Sit to Stand: Min guard         General transfer comment: cues for hand placement, no physical assist needed    Balance Overall balance assessment: Needs assistance   Sitting balance-Leahy Scale: Fair   Postural control: Posterior lean (initially) Standing balance support: Bilateral upper  extremity supported Standing balance-Leahy Scale: Poor Standing balance comment: B UE support with ambulation, able to release at sink                           ADL either performed or assessed with clinical judgement   ADL Overall ADL's : Needs assistance/impaired Eating/Feeding: Independent;Sitting   Grooming: Wash/dry hands;Standing;Min guard   Upper Body Bathing: Sitting;Minimal assistance   Lower Body Bathing: Sit to/from stand;Moderate assistance   Upper Body Dressing : Set up;Sitting   Lower Body Dressing: Moderate assistance;Sit to/from stand Lower Body Dressing Details (indicate cue type and reason): has a compression hose donner at home Toilet Transfer: Minimal assistance;Ambulation;RW   Toileting- Clothing Manipulation and Hygiene: Minimal assistance       Functional mobility during ADLs: Minimal assistance;Rolling walker       Vision Patient Visual Report: No change from baseline       Perception     Praxis      Pertinent Vitals/Pain Pain Assessment: No/denies pain     Hand Dominance Right   Extremity/Trunk Assessment Upper Extremity Assessment Upper Extremity Assessment: Generalized weakness   Lower Extremity Assessment Lower Extremity Assessment: Defer to PT evaluation   Cervical / Trunk Assessment Cervical / Trunk Assessment: Kyphotic   Communication Communication Communication: HOH   Cognition Arousal/Alertness: Awake/alert Behavior During Therapy: WFL for tasks assessed/performed Overall Cognitive Status: Within Functional Limits for tasks assessed  General Comments       Exercises     Shoulder Instructions      Home Living Family/patient expects to be discharged to:: Private residence Living Arrangements: Spouse/significant other Available Help at Discharge: Family;Available 24 hours/day Type of Home: House Home Access: Stairs to enter [employer] of  Steps: 3 Entrance Stairs-Rails: Left Home Layout: Multi-level;Able to live on main level with bedroom/bathroom     Bathroom Shower/Tub: Occupational psychologist: Standard     Home Equipment: Environmental consultant - 2 wheels;Grab bars - tub/shower;Hand held shower head;Cane - single point;Grab bars - toilet   Additional Comments: sleeps in recliner      Prior Functioning/Environment Level of Independence: Needs assistance  Gait / Transfers Assistance Needed: Amb with rolling walker at night and cane during day, RW outdoors ADL's / Homemaking Assistance Needed: standing to shower using grab bar and supervision of wife, needing assistance for compression socks and pants over feet, could don his shoes with a shoe horn   Comments: 2 falls in the last year        OT Problem List: Decreased strength;Decreased activity tolerance;Impaired balance (sitting and/or standing);Cardiopulmonary status limiting activity      OT Treatment/Interventions: Self-care/ADL training;DME and/or AE instruction;Therapeutic activities;Patient/family education;Balance training    OT Goals(Current goals can be found in the care plan section) Acute Rehab OT Goals Patient Stated Goal: be able to walk with the cane OT Goal Formulation: With patient Time For Goal Achievement: 02/06/21 Potential to Achieve Goals: Good ADL Goals Pt Will Perform Grooming: with supervision;standing Pt Will Transfer to Toilet: ambulating;with supervision;bedside commode (over toilet) Pt Will Perform Toileting - Clothing Manipulation and hygiene: with supervision;sit to/from stand Pt Will Perform Tub/Shower Transfer: with set-up;ambulating;rolling walker;Shower transfer  OT Frequency: Min 2X/week   Barriers to D/C:            Co-evaluation              AM-PAC OT "6 Clicks" Daily Activity     Outcome Measure Help from another person eating meals?: None Help from another person taking care of personal grooming?: A  Little Help from another person toileting, which includes using toliet, bedpan, or urinal?: A Little Help from another person bathing (including washing, rinsing, drying)?: A Lot Help from another person to put on and taking off regular upper body clothing?: A Little Help from another person to put on and taking off regular lower body clothing?: A Lot 6 Click Score: 17   End of Session Equipment Utilized During Treatment: Gait belt;Rolling walker  Activity Tolerance: Patient tolerated treatment well Patient left: in bed;with call bell/phone within reach;with bed alarm set  OT Visit Diagnosis: Unsteadiness on feet (R26.81);Other abnormalities of gait and mobility (R26.89);Muscle weakness (generalized) (M62.81) (decreased activity tolerance)                Time: 3419-6222 OT Time Calculation (min): 23 min Charges:  OT General Charges $OT Visit: 1 Visit OT Evaluation $OT Eval Moderate Complexity: 1 Mod OT Treatments $Self Care/Home Management : 8-22 mins  Nestor Lewandowsky, OTR/L Acute Rehabilitation Services Pager: [phone] Office: [phone]  Malka So 01/23/2021, 3:02 PM

## 2021-01-23 NOTE — Evaluation (Signed)
Physical Therapy Evaluation Patient Details Name: Todd Romero MRN: [id number] DOB: [birth year] Today's Date: 01/23/2021   History of Present Illness  87 y/o male admitted 5/18 with edema, weakness and dyspnea with CHF exacerbation. 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  Clinical Impression  Pt pleasant and reports unsteady last night and early today. Pt states he finished HHPT not long ago but has not continued to perform HEP and has limited gait tolerance with report of 2 falls in the last year. Pt with assist to rise from bed and reliant on RW and assist for gait and safety. Pt with decreased strength, function and independence who will benefit from acute therapy to maximize mobility and independence to decrease fall risk.   HR 73-82 SpO2 95% on RA    Follow Up Recommendations Home health PT    Equipment Recommendations  None recommended by PT    Recommendations for Other Services       Precautions / Restrictions Precautions Precautions: Fall Restrictions Weight Bearing Restrictions: No      Mobility  Bed Mobility Overal bed mobility: Needs Assistance Bed Mobility: Supine to Sit     Supine to sit: Min assist;HOB elevated     General bed mobility comments: HOb 15 degrees with rail with increased time and physical assist to lift trunk from surface    Transfers Overall transfer level: Needs assistance   Transfers: Sit to/from Stand Sit to Stand: Min guard         General transfer comment: cues for hand placement as pt pulling up on RW to stand and needs cues to reach for surface  Ambulation/Gait Ambulation/Gait assistance: Supervision Gait Distance (Feet): 140 Feet Assistive device: Rolling walker (2 wheeled) Gait Pattern/deviations: Step-through pattern;Decreased stride  length;Trunk flexed   Gait velocity interpretation: 1.31 - 2.62 ft/sec, indicative of limited community ambulator General Gait Details: cues for posture, proximity to RW, safety and direction. Pt limited by fatigue  Stairs            Wheelchair Mobility    Modified Rankin (Stroke Patients Only)       Balance Overall balance assessment: Needs assistance   Sitting balance-Leahy Scale: Poor Sitting balance - Comments: posterior bias in sitting with cues to scoot to edge of surface and put weight into feet   Standing balance support: Bilateral upper extremity supported Standing balance-Leahy Scale: Poor Standing balance comment: RW use in standing                             Pertinent Vitals/Pain Pain Assessment: No/denies pain    Home Living Family/patient expects to be discharged to:: Private residence Living Arrangements: Spouse/significant other Available Help at Discharge: Family;Available 24 hours/day Type of Home: House Home Access: Stairs to enter Entrance Stairs-Rails: Left Entrance Stairs-Number of Steps: 3 Home Layout: Multi-level;Able to live on main level with bedroom/bathroom Home Equipment: Walker - 2 wheels;Grab bars - tub/shower;Hand held shower head;Cane - single point Additional Comments: sleeps in recliner    Prior Function Level of Independence: Needs assistance   Gait / Transfers Assistance Needed: Amb with rolling walker at night and cane during day, RW outdoors  ADL's / Homemaking Assistance Needed: standing to shower using grab bar, needing assistance for socks and pants over feet, could don  his shoes with a shoe horn  Comments: 2 falls in the last year     Hand Dominance        Extremity/Trunk Assessment   Upper Extremity Assessment Upper Extremity Assessment: Generalized weakness    Lower Extremity Assessment Lower Extremity Assessment: Generalized weakness    Cervical / Trunk Assessment Cervical / Trunk  Assessment: Kyphotic  Communication   Communication: HOH  Cognition Arousal/Alertness: Awake/alert Behavior During Therapy: WFL for tasks assessed/performed Overall Cognitive Status: Within Functional Limits for tasks assessed                                        General Comments      Exercises General Exercises - Lower Extremity Short Arc Quad: AROM;Both;15 reps;Seated Hip Flexion/Marching: AROM;Both;Seated;15 reps   Assessment/Plan    PT Assessment Patient needs continued PT services  PT Problem List Decreased strength;Decreased mobility;Decreased activity tolerance;Decreased balance;Decreased knowledge of use of DME       PT Treatment Interventions Gait training;Stair training;Functional mobility training;Therapeutic activities;Patient/family education;Balance training;DME instruction;Therapeutic exercise    PT Goals (Current goals can be found in the Care Plan section)  Acute Rehab PT Goals Patient Stated Goal: be able to walk with the cane PT Goal Formulation: With patient Time For Goal Achievement: 02/06/21 Potential to Achieve Goals: Fair    Frequency Min 3X/week   Barriers to discharge Decreased caregiver support      Co-evaluation               AM-PAC PT "6 Clicks" Mobility  Outcome Measure Help needed turning from your back to your side while in a flat bed without using bedrails?: A Little Help needed moving from lying on your back to sitting on the side of a flat bed without using bedrails?: A Little Help needed moving to and from a bed to a chair (including a wheelchair)?: A Little Help needed standing up from a chair using your arms (e.g., wheelchair or bedside chair)?: A Little Help needed to walk in hospital room?: A Little Help needed climbing 3-5 steps with a railing? : A Little 6 Click Score: 18    End of Session Equipment Utilized During Treatment: Gait belt Activity Tolerance: Patient tolerated treatment well Patient  left: in chair;with call bell/phone within reach;with chair alarm set Nurse Communication: Mobility status PT Visit Diagnosis: Other abnormalities of gait and mobility (R26.89);Difficulty in walking, not elsewhere classified (R26.2);Muscle weakness (generalized) (M62.81)    Time: 0350-0938 PT Time Calculation (min) (ACUTE ONLY): 22 min   Charges:   PT Evaluation $PT Eval Moderate Complexity: 1 Mod          Olubunmi Rothenberger P, PT Acute Rehabilitation Services Pager: [phone] Office: [place] 01/23/2021, 10:44 AM

## 2021-01-23 NOTE — Progress Notes (Signed)
Patient ID: Todd Romero, male   DOB: May 05, 1934, 87 y.o.   MRN: [id number]     Advanced Heart Failure Rounding Note  PCP-Cardiologist: Sinclair Grooms, MD  [facility]: Dr. Aundra Dubin EP: Dr. 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  Recent admit 1/22 w/ a/c CHF in setting of persistent Afib, underwent DCCV, diuresed w/ IV lasix and required milrinone during admission.  Now readmitted w/ recurrent a/c CHF w/ marked fluid overload and NYHA Class IIIb symptoms, failing escalation of home diuretics.    Subjective:    Co-ox 68%, remains on milrinone 0.25.  I/Os net negative 1786.   CVP 18 cm.   Creatinine stable 2.8.   Difficult night, has trouble with anxiety in the hospital.   Objective:   Weight Range: 101 kg Body mass index is 34.87 kg/m.   Vital Signs:   Temp:  [97.6 F (36.4 C)-98.3 F (36.8 C)] 97.9 F (36.6 C) (05/20 0725) Pulse Rate:  [72-83] 82 (05/20 0725) Resp:  [17-21] 21 (05/20 0725) BP: (103-135)/(55-80) 125/75 (05/20 0725) SpO2:  [94 %-97 %] 95 % (05/20 0725) Weight:  [101 kg] 101 kg (05/20 0246)    Weight change: Filed Weights   01/21/21 1639 01/22/21 0500 01/23/21 0246  Weight: 102.1 kg 101.2 kg 101 kg    Intake/Output:   Intake/Output Summary (Last 24 hours) at 01/23/2021 0934 Last data filed at 01/23/2021 0800 Gross per 24 hour  Intake 554.38 ml  Output 2100 ml  Net -1545.62 ml      Physical Exam    General: NAD Neck: JVP 16, no thyromegaly or thyroid nodule.  Lungs: Clear to auscultation bilaterally with normal respiratory effort. CV: Nonpalpable PMI.  Heart regular S1/S2, no S3/S4, no murmur.  2+ edema to knees. Abdomen: Soft, nontender, no hepatosplenomegaly, no distention.  Skin: Intact  without lesions or rashes.  Neurologic: Alert and oriented x 3.  Psych: Normal affect. Extremities: No clubbing or cyanosis.  HEENT: Normal.    Telemetry   ?NSR with BiV pacing (personally reviewed)   Labs    CBC Recent Labs    01/21/21 1821  WBC 6.3  NEUTROABS 4.2  HGB 9.9*  HCT 30.7*  MCV 90.8  PLT 614   Basic Metabolic Panel Recent Labs    01/21/21 1821 01/22/21 0056 01/23/21 0315  NA 132* 131* 129*  K 3.5 3.7 3.0*  CL 93* 93* 91*  CO2 30 28 29   GLUCOSE 119* 157* 154*  BUN 89* 89* 79*  CREATININE 2.75* 2.88* 2.80*  CALCIUM 8.5* 8.5* 8.1*  MG 2.5*  --  2.2   Liver Function Tests Recent Labs    01/21/21 1821  AST 27  ALT 17  ALKPHOS 76  BILITOT 0.6  PROT 6.2*  ALBUMIN 3.1*   No results for input(s): LIPASE, AMYLASE in the last 72 hours. Cardiac Enzymes No results for input(s): CKTOTAL, CKMB, CKMBINDEX, TROPONINI in the last 72 hours.  BNP: BNP (last 3 results) Recent Labs    10/01/20 1710 01/21/21 1821  BNP 433.6* 1,245.4*    ProBNP (last 3 results) Recent Labs    01/30/20 1351 11/06/20 1218  PROBNP 1,776* 630.0*     D-Dimer No results for input(s): DDIMER in the last 72  hours. Hemoglobin A1C Recent Labs    01/21/21 1821  HGBA1C 7.2*   Fasting Lipid Panel No results for input(s): CHOL, HDL, LDLCALC, TRIG, CHOLHDL, LDLDIRECT in the last 72 hours. Thyroid Function Tests No results for input(s): TSH, T4TOTAL, T3FREE, THYROIDAB in the last 72 hours.  Invalid input(s): FREET3  Other results:   Imaging    DG CHEST PORT 1 VIEW  Result Date: 01/22/2021 CLINICAL DATA:  Status post coronary bypass graft. EXAM: PORTABLE CHEST 1 VIEW COMPARISON:  Same day. FINDINGS: Stable cardiomediastinal silhouette. Left-sided pacemaker is unchanged in position. No pneumothorax or pleural effusion is noted. Right-sided PICC line is noted with distal tip in expected position of the SVC. Mild bibasilar subsegmental atelectasis is noted. Bony thorax  is unremarkable. IMPRESSION: Mild bibasilar subsegmental atelectasis. Electronically Signed   By: Marijo Conception M.D.   On: 01/22/2021 15:14     Medications:     Scheduled Medications: . amiodarone  200 mg Oral Daily  . apixaban  2.5 mg Oral BID  . Chlorhexidine Gluconate Cloth  6 each Topical Daily  . ferrous sulfate  325 mg Oral BID WC  . glimepiride  2 mg Oral QAC breakfast  . insulin aspart  0-15 Units Subcutaneous TID WC  . insulin NPH Human  26 Units Subcutaneous QHS  . metolazone  5 mg Oral Once  . pantoprazole  40 mg Oral BID  . potassium chloride  60 mEq Oral Q3H  . pravastatin  80 mg Oral q1800  . sodium chloride flush  10-40 mL Intracatheter Q12H  . sodium chloride flush  3 mL Intravenous Q12H  . tamsulosin  0.4 mg Oral QPC supper    Infusions: . sodium chloride    . furosemide (LASIX) 200 mg in dextrose 5% 100 mL (2mg /mL) infusion 15 mg/hr (01/23/21 0331)  . milrinone 0.25 mcg/kg/min (01/23/21 6734)    PRN Medications: sodium chloride, acetaminophen, hydrOXYzine, ondansetron (ZOFRAN) IV, sodium chloride flush, sodium chloride flush, traZODone     Assessment/Plan   1. Acute on Chronic Systolic CHF: Suspect mixed ischemic/nonischemic cardiomyopathy.  Atrial fibrillation and excessive RV pacing may contribute to cardiomyopathy. Last cath in 2018 pre-TAVR with occluded D2 and 90% stenosis left PDA. He had MDT PPM for symptomatic bradycardia in 2/21, upgraded to CRT-P in 3/22 due to high percentage of RV pacing. TEE (2/22) with EF 25-30%, mildly decreased RV systolic function, normal function of TAVR valve.  PYP scan not suggestive of TTR cardiac amyloidosis.  He required milrinone during 2/22 admission.  CHF is complicated by CKD stage IV.  Continues to struggle w/ marked fluid overload and NYHA Class IIIb symptoms, failing recent escalation of home diuretics. Admitted for IV diuretics and milrinone to help w/ diuresis. He is on milrinone 0.25, co-ox 68% today.   Reasonable diuresis with Lasix gtt 15 mg/hr and metolazone yesterday, creatinine stable 2.8.  Still volume overloaded with CVP 18.  - Continue milrinone 0.25  - Continue Lasix gtt at 15 mg/hr and give 5 mg of metolazone x 1  - No ARNI, spironolactone with CKD stage IV.  - He did not tolerate Wilder Glade - c/w UNNA boots  - Avoiding beta blocker with marked volume overload and concern for low output HF requiring milrinone  - Not candidate for advanced therapies with age and CKD stage IV. Will involve palliative care for goals of care.  2. Atrial fibrillation: Paroxysmal. TEE-DCCV on 10/07/20 initially converted him to NSR but back to atrial fibrillation soon after. Converted back  to NSR on 10/08/20 with amiodarone. AV-dual paced on tele/ admit EKG  - Continue amio 200 mg daily.  Recent LFTs/TSH normal, will need regular eye exam.  - Continue Eliquis 2.5 mg bid. There has been consideration for Watchman, but he is not falling frequently and does not have GI bleeding history.  Given CKD and uncontrolled CHF, would avoid the additional procedure.  3. CAD: Cath in 2018 pre-TAVR with occluded D2 and 90% stenosis left PDA, medically managed.  No chest pain.  - On Eliquis so no aspirin.  - Continue pravastatin, good lipids in 4/22.  4. CKD Stage IV: baseline SCr 2-3 range, stable at 2.8 today.  - followed by outpatient nephrology.  5. Bradycardia --> PPM [employer]tr[employer] --> upgraded to CRT-P on 12/01/20 6. Anxiety: avoid xanax as he had SEs in the past.  - Trial of hydroxyzine  - Trazodone qhs prn  Length of Stay: 2  [name], MD  01/23/2021, 9:34 AM  Advanced Heart Failure Team Pager [phone] (M-F; 7a - 5p)  Please contact [facility] for night-coverage after hours (5p -7a ) and weekends on [email or web address]

## 2021-01-24 DIAGNOSIS — Z7189 Other specified counseling: Secondary | ICD-10-CM

## 2021-01-24 DIAGNOSIS — Z515 Encounter for palliative care: Secondary | ICD-10-CM

## 2021-01-24 LAB — COOXEMETRY PANEL
Carboxyhemoglobin: 0.1 % — ABNORMAL LOW (ref 0.5–1.5)
Methemoglobin: 0.8 % (ref 0.0–1.5)
O2 Saturation: 62.2 %
Total hemoglobin: 9.7 g/dL — ABNORMAL LOW (ref 12.0–16.0)

## 2021-01-24 LAB — BASIC METABOLIC PANEL WITH GFR
Anion gap: 10 (ref 5–15)
BUN: 79 mg/dL — ABNORMAL HIGH (ref 8–23)
CO2: 29 mmol/L (ref 22–32)
Calcium: 8.4 mg/dL — ABNORMAL LOW (ref 8.9–10.3)
Chloride: 89 mmol/L — ABNORMAL LOW (ref 98–111)
Creatinine, Ser: 2.73 mg/dL — ABNORMAL HIGH (ref 0.61–1.24)
GFR, Estimated: 22 mL/min — ABNORMAL LOW
Glucose, Bld: 224 mg/dL — ABNORMAL HIGH (ref 70–99)
Potassium: 3.6 mmol/L (ref 3.5–5.1)
Sodium: 128 mmol/L — ABNORMAL LOW (ref 135–145)

## 2021-01-24 LAB — GLUCOSE, CAPILLARY
Glucose-Capillary: 84 mg/dL (ref 70–99)
Glucose-Capillary: 131 mg/dL — ABNORMAL HIGH (ref 70–99)
Glucose-Capillary: 212 mg/dL — ABNORMAL HIGH (ref 70–99)

## 2021-01-24 LAB — MAGNESIUM: Magnesium: 2.3 mg/dL (ref 1.7–2.4)

## 2021-01-24 MED ORDER — POTASSIUM CHLORIDE CRYS ER 20 MEQ PO TBCR
60.0000 meq | EXTENDED_RELEASE_TABLET | ORAL | Status: AC
  Administered 2021-01-24 (×2): 60 meq via ORAL
  Filled 2021-01-24 (×2): qty 3

## 2021-01-24 MED ORDER — METOLAZONE 2.5 MG PO TABS
5.0000 mg | ORAL_TABLET | Freq: Two times a day (BID) | ORAL | Status: AC
  Administered 2021-01-24 (×2): 5 mg via ORAL
  Filled 2021-01-24 (×2): qty 2

## 2021-01-24 NOTE — Progress Notes (Signed)
Mobility Specialist: Progress Note   01/24/21 1821  Mobility  Activity Ambulated in hall  Level of Assistance Minimal assist, patient does 75% or more  Assistive Device Front wheel walker  Distance Ambulated (ft) 92 ft  Mobility Ambulated with assistance in hallway  Mobility Response Tolerated well  Mobility performed by Mobility specialist  $Mobility charge 1 Mobility   Pre-Mobility: 80 HR, 97% SpO2 Post-Mobility: 80 HR, 124/66 BP, 94% SpO2  Pt c/o feeling fatigued towards end of ambulation, otherwise asx. Pt is back in the bed with call bell at his side.   [facility] Emory Leaver Mobility Specialist Mobility Specialist Phone: [phone]

## 2021-01-24 NOTE — Plan of Care (Signed)
  Problem: Education: Goal: Ability to verbalize understanding of medication therapies will improve Outcome: Progressing   Problem: Activity: Goal: Capacity to carry out activities will improve Outcome: Progressing   Problem: Education: Goal: Knowledge of General Education information will improve Description: Including pain rating scale, medication(s)/side effects and non-pharmacologic comfort measures Outcome: Progressing   Problem: Clinical Measurements: Goal: Ability to maintain clinical measurements within normal limits will improve Outcome: Progressing   Problem: Activity: Goal: Risk for activity intolerance will decrease Outcome: Progressing   Problem: Nutrition: Goal: Adequate nutrition will be maintained Outcome: Progressing   Problem: Coping: Goal: Level of anxiety will decrease Outcome: Progressing   Problem: Pain Managment: Goal: General experience of comfort will improve Outcome: Progressing   Problem: Skin Integrity: Goal: Risk for impaired skin integrity will decrease Outcome: Progressing

## 2021-01-24 NOTE — Progress Notes (Signed)
Patient ID: Todd Romero, male   DOB: Oct 12, 1933, 87 y.o.   MRN: [id number]     Advanced Heart Failure Rounding Note  PCP-Cardiologist: Sinclair Grooms, MD  [facility]: Dr. Aundra Dubin EP: Dr. 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  Recent admit 1/22 w/ a/c CHF in setting of persistent Afib, underwent DCCV, diuresed w/ IV lasix and required milrinone during admission.  Now readmitted w/ recurrent a/c CHF w/ marked fluid overload and NYHA Class IIIb symptoms, failing escalation of home diuretics.    Subjective:    Co-ox 62%, remains on milrinone 0.25.  I/Os net negative 686, no change to weight.   CVP 17-18 cm.   Creatinine stable 2.73.   Slept better.  Walked with PT. Had BM.   Objective:   Weight Range: 101.1 kg Body mass index is 34.91 kg/m.   Vital Signs:   Temp:  [97.5 F (36.4 C)-98.3 F (36.8 C)] 98.2 F (36.8 C) (05/21 0749) Pulse Rate:  [72-82] 72 (05/21 0749) Resp:  [18-20] 20 (05/21 0317) BP: (98-126)/(56-66) 98/56 (05/21 0749) SpO2:  [92 %-97 %] 94 % (05/21 0749) Weight:  [101.1 kg] 101.1 kg (05/21 0323) Last BM Date: 01/24/21  Weight change: Filed Weights   01/22/21 0500 01/23/21 0246 01/24/21 0323  Weight: 101.2 kg 101 kg 101.1 kg    Intake/Output:   Intake/Output Summary (Last 24 hours) at 01/24/2021 1015 Last data filed at 01/24/2021 0944 Gross per 24 hour  Intake 1259.21 ml  Output 2600 ml  Net -1340.79 ml      Physical Exam    General: NAD Neck: JVP 16 cm, no thyromegaly or thyroid nodule.  Lungs: Clear to auscultation bilaterally with normal respiratory effort. CV: Nondisplaced PMI.  Heart regular S1/S2, no S3/S4, no murmur.  1+ edema to knees. Abdomen: Soft, nontender, no hepatosplenomegaly,  no distention.  Skin: Intact without lesions or rashes.  Neurologic: Alert and oriented x 3.  Psych: Normal affect. Extremities: No clubbing or cyanosis.  HEENT: Normal.    Telemetry   ?NSR with BiV pacing (personally reviewed)   Labs    CBC Recent Labs    01/21/21 1821  WBC 6.3  NEUTROABS 4.2  HGB 9.9*  HCT 30.7*  MCV 90.8  PLT 947   Basic Metabolic Panel Recent Labs    01/23/21 0315 01/24/21 0331  NA 129* 128*  K 3.0* 3.6  CL 91* 89*  CO2 29 29  GLUCOSE 154* 224*  BUN 79* 79*  CREATININE 2.80* 2.73*  CALCIUM 8.1* 8.4*  MG 2.2 2.3   Liver Function Tests Recent Labs    01/21/21 1821  AST 27  ALT 17  ALKPHOS 76  BILITOT 0.6  PROT 6.2*  ALBUMIN 3.1*   No results for input(s): LIPASE, AMYLASE in the last 72 hours. Cardiac Enzymes No results for input(s): CKTOTAL, CKMB, CKMBINDEX, TROPONINI in the last 72 hours.  BNP: BNP (last 3 results) Recent Labs    10/01/20 1710 01/21/21 1821  BNP 433.6* 1,245.4*    ProBNP (last 3 results) Recent Labs    01/30/20 1351 11/06/20 1218  PROBNP 1,776* 630.0*     D-Dimer No results for input(s): DDIMER in the last 72 hours. Hemoglobin A1C Recent Labs  01/21/21 1821  HGBA1C 7.2*   Fasting Lipid Panel No results for input(s): CHOL, HDL, LDLCALC, TRIG, CHOLHDL, LDLDIRECT in the last 72 hours. Thyroid Function Tests No results for input(s): TSH, T4TOTAL, T3FREE, THYROIDAB in the last 72 hours.  Invalid input(s): FREET3  Other results:   Imaging    No results found.   Medications:     Scheduled Medications: . amiodarone  200 mg Oral Daily  . apixaban  2.5 mg Oral BID  . Chlorhexidine Gluconate Cloth  6 each Topical Daily  . docusate sodium  100 mg Oral BID  . ferrous sulfate  325 mg Oral BID WC  . glimepiride  2 mg Oral QAC breakfast  . insulin aspart  0-15 Units Subcutaneous TID WC  . insulin NPH Human  26 Units Subcutaneous QHS  . metolazone  5 mg Oral BID  . pantoprazole  40 mg  Oral BID  . potassium chloride  60 mEq Oral Q4H  . pravastatin  80 mg Oral q1800  . sodium chloride flush  10-40 mL Intracatheter Q12H  . sodium chloride flush  3 mL Intravenous Q12H  . tamsulosin  0.4 mg Oral QPC supper    Infusions: . sodium chloride    . furosemide (LASIX) 200 mg in dextrose 5% 100 mL (2mg /mL) infusion 15 mg/hr (01/24/21 0320)  . milrinone 0.25 mcg/kg/min (01/24/21 0644)    PRN Medications: sodium chloride, acetaminophen, hydrOXYzine, polyethylene glycol, sodium chloride flush, sodium chloride flush, traZODone     Assessment/Plan   1. Acute on Chronic Systolic CHF: Suspect mixed ischemic/nonischemic cardiomyopathy.  Atrial fibrillation and excessive RV pacing may contribute to cardiomyopathy. Last cath in 2018 pre-TAVR with occluded D2 and 90% stenosis left PDA. He had MDT PPM for symptomatic bradycardia in 2/21, upgraded to CRT-P in 3/22 due to high percentage of RV pacing. TEE (2/22) with EF 25-30%, mildly decreased RV systolic function, normal function of TAVR valve.  PYP scan not suggestive of TTR cardiac amyloidosis.  He required milrinone during 2/22 admission.  CHF is complicated by CKD stage IV.  Continues to struggle w/ marked fluid overload and NYHA Class IIIb symptoms, failing recent escalation of home diuretics. Admitted for IV diuretics and milrinone to help w/ diuresis. He is on milrinone 0.25, co-ox 62% today.  He did not diurese vigorously yesterday, creatinine mildly lower at 2.73.  Still volume overloaded with CVP 18.  - Continue milrinone 0.25  - Increase Lasix to 20 mg/hr and will give metolazone 5 mg bid today. Replace K.  - No ARNI, spironolactone with CKD stage IV.  - He did not tolerate Wilder Glade - c/w UNNA boots  - Avoiding beta blocker with marked volume overload and concern for low output HF requiring milrinone  - Not candidate for advanced therapies with age and CKD stage IV. Will involve palliative care for goals of care.  2. Atrial  fibrillation: Paroxysmal. TEE-DCCV on 10/07/20 initially converted him to NSR but back to atrial fibrillation soon after. Converted back to NSR on 10/08/20 with amiodarone. AV-dual paced on tele/ admit EKG.  - Continue amio 200 mg daily.  Recent LFTs/TSH normal, will need regular eye exam.  - Continue Eliquis 2.5 mg bid. There has been consideration for Watchman, but he is not falling frequently and does not have GI bleeding history.  Given CKD and uncontrolled CHF, would avoid the additional procedure.  - ECG today.  3. CAD: Cath in 2018 pre-TAVR with occluded D2 and 90% stenosis left PDA, medically managed.  No chest pain.  - On Eliquis so no aspirin.  - Continue pravastatin, good lipids in 4/22.  4. CKD Stage IV: baseline SCr 2-3 range, stable at 2.73 today.  - followed by outpatient nephrology.  5. Bradycardia --> PPM [employer] --> upgraded to CRT-P on 12/01/20 6. Anxiety: avoid xanax as he had SEs in the past.  - Trial of hydroxyzine  - Trazodone qhs prn 7. Hyponatremia: Hypovolemic.  - 1500 cc po fluid restriction.   Length of Stay: 3  [name], MD  01/24/2021, 10:15 AM  Advanced Heart Failure Team Pager [phone] (M-F; 7a - 5p)  Please contact [facility] for night-coverage after hours (5p -7a ) and weekends on [email or web address]

## 2021-01-24 NOTE — Consult Note (Signed)
Consultation Note Date: 01/24/2021   Patient Name: Todd Romero  DOB: [birth year]  MRN: [id number]  Age / Sex: 87 y.o., male   PCP: Ma Hillock, DO Referring Physician: Larey Dresser, MD   REASON FOR CONSULTATION:Establishing goals of care  Palliative Care consult requested for goals of care discussion in this 87 y.o. male with a medical history significant for chronic systolic CHF (EF 28-76%), severe aortic stenosis s/p TAVR (2018), symptomatic bradycardia, RBBB s/p permanent pacemaker (10/2019), coronary artery disease, atrial fibrillation, insulin-dependent diabetes, stage IV CKD, severe OSA, hypertension, and carpal tunnel s/p bilateral carpal tunnel release.  Patient directly admitted from home with complaints of weakness and shortness of breath, despite medication adjustements. Recently seen by heart failure clinic and the paramedicine team with adjustments made to his medications with a goal of improving dyspnea and lower extremity edema.  Patient currently being followed by heart failure.  He is receiving milrinone, Lasix drip, and spironolactone.  Clinical Assessment and Goals of Care: I have reviewed medical records including lab results, imaging, Epic notes, and MAR, received report from the bedside RN, and assessed the patient.   I met at the bedside with patient and spoke with his wife via phone to discuss diagnosis prognosis, [place], EOL wishes, disposition and options. Todd Romero is resting in bed. Awake, alert and oriented x3. Denies pain or discomfort. Endorses shortness of breath with exertion.   I introduced Palliative Medicine as specialized medical care for people living with serious illness. It focuses on providing relief from the symptoms and stress of a serious illness. The goal is to improve quality of life for both the patient and the family.  We discussed a brief life review of the patient, along with his functional and nutritional status.  Patient is  originally from [place], [place].  He and his wife have been married for over 56 years with the upcoming anniversary on 5/25.  They have 4 children ([address] [place], [place], [place]) and 3 grandchildren.  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   Prior to admission patient was able to perform all ADLs independently with frequent rest breaks due to fatigue and shortness of breath.  He states he sleeps in a recliner due to his OSA.  Appetite fair.  We discussed His current illness and what it means in the larger context of His on-going co-morbidities. Natural disease trajectory and expectations at EOL were discussed.  Patient and wife verbalized understanding of current illness and comorbidities.  Wife is tearful expressing her observation of patient's continued decline and awareness that he is not getting any better.  She states patient knows he is not getting better also.  They are remaining hopeful for some stability and patient's ability to return home.  A detailed discussion was had today regarding advanced directives.  Concepts specific to code status, artifical feeding and hydration, continued IV antibiotics and rehospitalization.  Patient has a documented advanced directive which is on file has been reviewed.  His wife Todd Romero is his primary medical decision-maker and his son Todd Romero is listed as secondary.  I discussed at length patient's current full code status with consideration of his current illness and comorbidities.  Patient and wife verbalizes understanding.  Wife is tearful expressing she would not want to have to make a decision to withdraw care if ever placed on life support.  Patient states he wishes to remain a  full code at this time.  Todd Romero and his wife plans to continue discussions. He states he would like to further discuss with his cardiologist and outpatient medical team.   Mr.  Romero did acknowledge his understanding of dialysis sharing his experience with 2 of his friends who are both deceased.  He states he would never want to undergo dialysis and if he reached the point this was required he would then want his family to just let him go.  The difference between a aggressive medical intervention and a palliative comfort care path were discussed at length. Values and goals of care important to patient and family were attempted to be elicited.    I discussed the importance of continued conversation with family and their medical providers regarding overall plan of care and treatment options, ensuring decisions are within the context of the patients values and GOCs.  Patient and his wife are clear and expressed goals to continue to treat the treatable, full CODE STATUS, as they remain hopeful for some stability.  They are aware of patient's long-term prognosis however wife states they are hopeful patient will continue to thrive and be able to spend a little more time with family, more specifically their annual 4 July picnic at their home.  Patient states his goal is to return home with family as he had a horrific experience in the past at rehabilitation.   Palliative Care services outpatient were explained and offered. Patient and family verbalized their understanding and awareness of both palliative's goals and philosophy of care.  Outpatient palliative recommended in the setting of patient's expressed goals to continue to treat the treatable.  Wife and patient verbalized understanding and confirms wishes for outpatient palliative support at discharge.  Questions and concerns were addressed.  Hard Choices booklet left for review. The family was encouraged to call with questions or concerns.  PMT will continue to support holistically as needed.   CODE STATUS: Full code  ADVANCE DIRECTIVES: Primary Decision Maker: Patient/wife   SYMPTOM MANAGEMENT: Per  attending  Palliative Prophylaxis:   Bowel Regimen, Delirium Protocol, Eye Care, Frequent Pain Assessment, Oral Care and Turn Reposition  PSYCHO-SOCIAL/SPIRITUAL:  Support System: Family  Desire for further Chaplaincy support: No  Additional Recommendations (Limitations, Scope, Preferences):  Full Scope Treatment, No Hemodialysis and Treat the treatable, ongoing discussions  Education on hospice/palliative    PAST MEDICAL HISTORY: Past Medical History:  Diagnosis Date  . AKI (acute kidney injury) ([facility]) 10/24/2019  . Anemia   . Arthritis   . CAD (coronary artery disease)    a. cardiac cath 12/2016 showing moderate AS, elevated LVEDP, heavy 3V coronary calcification, 100% mD2, 30-50% LAD, 50-90% stenosis of Cx proximal to origin of L-PDA, RCA not engaged due to poor catheter control (difficult procedure) - coronary status was essentially unchanged from prior.  . Chicken pox   . Chronic diastolic CHF (congestive heart failure) ([facility])   . Colon polyps   . Degenerative arthritis   . Diabetes ([facility])   . Diabetes mellitus without complication ([facility])   . Dyspnea   . Dyspnea on exertion 02/11/2015  . Essential hypertension   . Heart disease   . Heme positive stool 11/16/2017  . Hyperlipidemia   . Intermediate stage nonexudative age-related macular degeneration of both eyes 02/27/2015  . Leukocytosis 11/16/2017  . New onset a-fib ([facility]) 03/04/2018  . Obesity (BMI 30-39.9) 06/18/2015  . OSA (obstructive sleep apnea)    Severe with AHI 27/hr now on CPAP  not  compliant with treatment.  . Peritonitis ([facility]) 1985?  [name] Pneumonia    "6 months - 87 years old"  . Posterior vitreous detachment, left 03/02/2016  . Presence of permanent cardiac pacemaker   . Pseudophakia of both eyes 03/02/2016  . Pure hypercholesterolemia   . RBBB   . [facility] spotted fever   . 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 Severe aortic stenosis    a. s/p TAVR 03/2017.  [name] Symptomatic bradycardia 10/24/2019  . Type II or unspecified type diabetes mellitus without mention of complication, not stated as uncontrolled   . Varicose vein of leg     ALLERGIES:  is allergic to [employer] tartrate], other, bactrim [sulfamethoxazole-trimethoprim], bactrim [sulfamethoxazole-trimethoprim], morphine and related, and morphine and related.   MEDICATIONS:  Current Facility-Administered Medications  Medication Dose Route Frequency Provider Last Rate Last Admin  . 0.9 %  sodium chloride infusion  250 mL Intravenous PRN Lyda Jester M, PA-C      . acetaminophen (TYLENOL) tablet 500 mg  500 mg Oral Q4H PRN Lyda Jester M, PA-C   500 mg at 01/24/21 0050  . amiodarone (PACERONE) tablet 200 mg  200 mg Oral Daily Lyda Jester M, PA-C   200 mg at 01/24/21 3762  . apixaban (ELIQUIS) tablet 2.5 mg  2.5 mg Oral BID Lyda Jester M, PA-C   2.5 mg at 01/24/21 8315  . Chlorhexidine Gluconate Cloth 2 % PADS 6 each  6 each Topical Daily Larey Dresser, MD   6 each at 01/23/21 1031  . docusate sodium (COLACE) capsule 100 mg  100 mg Oral BID Larey Dresser, MD   100 mg at 01/23/21 2151  . ferrous sulfate tablet 325 mg  325 mg Oral BID WC Lyda Jester M, PA-C   325 mg at 01/24/21 1761  . furosemide (LASIX) 200 mg in dextrose 5 % 100 mL (2 mg/mL) infusion  15 mg/hr Intravenous Continuous Larey Dresser, MD 7.5 mL/hr at 01/24/21 0320 15 mg/hr at 01/24/21 0320  . glimepiride (AMARYL) tablet 2 mg  2 mg Oral QAC breakfast Lyda Jester M, PA-C   2 mg at 01/24/21 6073  . hydrOXYzine (ATARAX/VISTARIL) tablet 25 mg  25 mg Oral Q6H PRN Consuelo Pandy, PA-C   25 mg at 01/24/21 0405  . insulin aspart (novoLOG) injection 0-15 Units  0-15 Units Subcutaneous TID WC Lyda Jester M, PA-C   5 Units at 01/23/21 1645  . insulin NPH Human (NOVOLIN N) injection 26  Units  26 Units Subcutaneous QHS Lyda Jester [place], [place]   26 Units at 01/23/21 2151  . milrinone (PRIMACOR) 20 MG/100 ML (0.2 mg/mL) infusion  0.25 mcg/kg/min Intravenous Continuous Consuelo Pandy, PA-C 7.59 mL/hr at 01/24/21 0644 0.25 mcg/kg/min at 01/24/21 0644  . pantoprazole (PROTONIX) EC tablet 40 mg  40 mg Oral BID Lyda Jester M, PA-C   40 mg at 01/24/21 7106  . polyethylene glycol (MIRALAX / GLYCOLAX) packet 17 g  17 g Oral Daily PRN Larey Dresser, MD   17 g at 01/23/21 1031  . pravastatin (PRAVACHOL) tablet 80 mg  80 mg Oral q1800 Consuelo Pandy, PA-C   80 mg at 01/23/21 1628  . sodium chloride flush (NS) 0.9 % injection 10-40 mL  10-40 mL Intracatheter Q12H Larey Dresser, MD   10 mL at 01/23/21 2152  . sodium chloride  flush (NS) 0.9 % injection 10-40 mL  10-40 mL Intracatheter PRN Larey Dresser, MD      . sodium chloride flush (NS) 0.9 % injection 3 mL  3 mL Intravenous Q12H Lyda Jester M, PA-C   3 mL at 01/24/21 0944  . sodium chloride flush (NS) 0.9 % injection 3 mL  3 mL Intravenous PRN Lyda Jester M, PA-C      . tamsulosin (FLOMAX) capsule 0.4 mg  0.4 mg Oral QPC supper Lyda Jester M, PA-C   0.4 mg at 01/23/21 1629  . traZODone (DESYREL) tablet 50 mg  50 mg Oral QHS PRN Larey Dresser, MD   50 mg at 01/23/21 2150    VITAL SIGNS: BP (!) 98/56 (BP Location: Left Arm)   Pulse 72   Temp 98.2 F (36.8 C) (Axillary)   Resp 20   Ht _0  (1.702 m)   Wt 101.1 kg   SpO2 94%   BMI 34.91 kg/m  Filed Weights   01/22/21 0500 01/23/21 0246 01/24/21 0323  Weight: 101.2 kg 101 kg 101.1 kg    Estimated body mass index is 34.91 kg/m as calculated from the following:   Height as of this encounter: _1  (1.702 m).   Weight as of this encounter: 101.1 kg.  LABS: CBC:    Component Value Date/Time   WBC 6.3 01/21/2021 1821   HGB 9.9 (L) 01/21/2021 1821   HGB 11.8 (L) 01/30/2020 1351   HCT 30.7 (L) 01/21/2021 1821   HCT 35.1 (L)  01/30/2020 1351   PLT 155 01/21/2021 1821   PLT 143 (L) 01/30/2020 1351   Comprehensive Metabolic Panel:    Component Value Date/Time   NA 128 (L) 01/24/2021 0331   NA 138 11/18/2020 1121   K 3.6 01/24/2021 0331   BUN 79 (H) 01/24/2021 0331   BUN 53 (H) 11/18/2020 1121   CREATININE 2.73 (H) 01/24/2021 0331   CREATININE 1.66 (H) 01/02/2020 1035   ALBUMIN 3.1 (L) 01/21/2021 1821   ALBUMIN 4.2 08/12/2020 1554     Review of Systems  Neurological: Positive for weakness.  Unless otherwise noted, a complete review of systems is negative.  Physical Exam General: NAD Cardiovascular: regular rate and rhythm Pulmonary: diminished bilaterally  Abdomen: soft, nontender, + bowel sounds Extremities: Bilateral lower extremity edema, no joint deformities Skin: no rashes, warm and dry Neurological: AAOx3, mood appropriate   Prognosis: Guarded in the setting of chronic systolic CHF, mixed ischemic/nonischemic cardiomyopathy, EF 25-30%, atrial fibrillation, AAS s/p TAVR, CAD, bradycardia s/p PPM, CKD stage IV, diabetes, dyspnea with exertion, deconditioning.  Discharge Planning:  To Be Determined with outpatient palliative  Recommendations: . Full Code-as confirmed by patient and wife.  Family is to continue to have ongoing discussions regarding CODE STATUS. [name] Patient states he is not interested in dialysis if ever needed.  States when he is at that point his wishes would then be for family to focus on his comfort.  . Continue to treat the treatable.  Patient and wife goals are clear as they are remaining hopeful for some stability with a goal of returning home. . Outpatient palliative support (TOC referral) . PMT will continue to support and follow as needed. Please call team line with urgent needs.   Palliative Performance Scale: PPS 30-40%              Patient and wife expressed understanding and was in agreement with this plan.   Thank you for allowing the Palliative Medicine  Team  to assist in the care of this patient. Please utilize secure chat with additional questions, if there is no response within 30 minutes please call the above phone number.   Time In: 0950 Time Out: 1055 Time Total: 55 min.   Visit consisted of counseling and education dealing with the complex and emotionally intense issues of symptom management and palliative care in the setting of serious and potentially life-threatening illness.Greater than 50%  of this time was spent counseling and coordinating care related to the above assessment and plan.  Signed by:  Alda Lea, AGPCNP-BC Palliative Medicine Team  Phone: [phone] Pager: [phone] Amion: [facility] Team providers are available by phone from 7am to 7pm daily and can be reached through the team cell phone.  Should this patient require assistance outside of these hours, please call the patient's attending physician.

## 2021-01-25 LAB — CBC
HCT: 27.6 % — ABNORMAL LOW (ref 39.0–52.0)
Hemoglobin: 9.1 g/dL — ABNORMAL LOW (ref 13.0–17.0)
MCH: 29.6 pg (ref 26.0–34.0)
MCHC: 33 g/dL (ref 30.0–36.0)
MCV: 89.9 fL (ref 80.0–100.0)
Platelets: 160 10*3/uL (ref 150–400)
RBC: 3.07 MIL/uL — ABNORMAL LOW (ref 4.22–5.81)
RDW: 15.9 % — ABNORMAL HIGH (ref 11.5–15.5)
WBC: 7.7 10*3/uL (ref 4.0–10.5)
nRBC: 0 % (ref 0.0–0.2)

## 2021-01-25 LAB — GLUCOSE, CAPILLARY
Glucose-Capillary: 125 mg/dL — ABNORMAL HIGH (ref 70–99)
Glucose-Capillary: 173 mg/dL — ABNORMAL HIGH (ref 70–99)
Glucose-Capillary: 189 mg/dL — ABNORMAL HIGH (ref 70–99)
Glucose-Capillary: 315 mg/dL — ABNORMAL HIGH (ref 70–99)

## 2021-01-25 LAB — COOXEMETRY PANEL
Carboxyhemoglobin: 1.3 % (ref 0.5–1.5)
Methemoglobin: 1.2 % (ref 0.0–1.5)
O2 Saturation: 62.3 %
Total hemoglobin: 9.4 g/dL — ABNORMAL LOW (ref 12.0–16.0)

## 2021-01-25 LAB — BASIC METABOLIC PANEL
Anion gap: 10 (ref 5–15)
BUN: 78 mg/dL — ABNORMAL HIGH (ref 8–23)
CO2: 29 mmol/L (ref 22–32)
Calcium: 8.4 mg/dL — ABNORMAL LOW (ref 8.9–10.3)
Chloride: 88 mmol/L — ABNORMAL LOW (ref 98–111)
Creatinine, Ser: 2.65 mg/dL — ABNORMAL HIGH (ref 0.61–1.24)
GFR, Estimated: 23 mL/min — ABNORMAL LOW (ref >60)
Glucose, Bld: 267 mg/dL — ABNORMAL HIGH (ref 70–99)
Potassium: 3.3 mmol/L — ABNORMAL LOW (ref 3.5–5.1)
Sodium: 127 mmol/L — ABNORMAL LOW (ref 135–145)

## 2021-01-25 LAB — SODIUM: Sodium: 125 mmol/L — ABNORMAL LOW (ref 135–145)

## 2021-01-25 MED ORDER — METOLAZONE 2.5 MG PO TABS
5.0000 mg | ORAL_TABLET | Freq: Two times a day (BID) | ORAL | Status: AC
  Administered 2021-01-25 (×2): 5 mg via ORAL
  Filled 2021-01-25 (×2): qty 2

## 2021-01-25 MED ORDER — POTASSIUM CHLORIDE CRYS ER 20 MEQ PO TBCR
40.0000 meq | EXTENDED_RELEASE_TABLET | Freq: Once | ORAL | Status: AC
  Administered 2021-01-25: 40 meq via ORAL
  Filled 2021-01-25: qty 2

## 2021-01-25 MED ORDER — POTASSIUM CHLORIDE CRYS ER 20 MEQ PO TBCR
80.0000 meq | EXTENDED_RELEASE_TABLET | Freq: Two times a day (BID) | ORAL | Status: AC
  Administered 2021-01-25 (×2): 80 meq via ORAL
  Filled 2021-01-25 (×2): qty 4

## 2021-01-25 MED ORDER — TOLVAPTAN 15 MG PO TABS
15.0000 mg | ORAL_TABLET | Freq: Once | ORAL | Status: AC
  Administered 2021-01-25: 15 mg via ORAL
  Filled 2021-01-25: qty 1

## 2021-01-25 NOTE — Progress Notes (Signed)
Mobility Specialist: Progress Note   01/25/21 1115  Mobility  Activity Ambulated in hall  Level of Assistance Moderate assist, patient does 50-74%  Assistive Device Front wheel walker  Distance Ambulated (ft) 176 ft  Mobility Ambulated with assistance in hallway  Mobility Response Tolerated well  Mobility performed by Mobility specialist  Bed Position Chair  $Mobility charge 1 Mobility   Pre-Mobility: 70 HR, 94% SpO2 Post-Mobility: 73 HR, 96% SpO2  Pt was minA to modA to stand but contact guard during ambulation, pt asx. Pt back to chair after walk per request.   Harrell Gave Yaretzi Ernandez Mobility Specialist Mobility Specialist Phone: [phone]

## 2021-01-25 NOTE — Plan of Care (Signed)
  Problem: Education: Goal: Ability to demonstrate management of disease process will improve 01/25/2021 1525 by Thressa Sheller, RN Outcome: Progressing 01/25/2021 1525 by Thressa Sheller, RN Outcome: Progressing Goal: Ability to verbalize understanding of medication therapies will improve 01/25/2021 1525 by Thressa Sheller, RN Outcome: Progressing 01/25/2021 1525 by Thressa Sheller, RN Outcome: Progressing Goal: Individualized Educational Video(s) Outcome: Progressing   Problem: Activity: Goal: Capacity to carry out activities will improve Outcome: Progressing   Problem: Cardiac: Goal: Ability to achieve and maintain adequate cardiopulmonary perfusion will improve 01/25/2021 1525 by Thressa Sheller, RN Outcome: Progressing 01/25/2021 1525 by Thressa Sheller, RN Outcome: Progressing   Problem: Education: Goal: Knowledge of General Education information will improve Description: Including pain rating scale, medication(s)/side effects and non-pharmacologic comfort measures 01/25/2021 1525 by Thressa Sheller, RN Outcome: Progressing 01/25/2021 1525 by Thressa Sheller, RN Outcome: Progressing   Problem: Health Behavior/Discharge Planning: Goal: Ability to manage health-related needs will improve Outcome: Progressing   Problem: Clinical Measurements: Goal: Ability to maintain clinical measurements within normal limits will improve Outcome: Progressing Goal: Will remain free from infection Outcome: Progressing Goal: Diagnostic test results will improve Outcome: Progressing Goal: Respiratory complications will improve 01/25/2021 1525 by Thressa Sheller, RN Outcome: Progressing 01/25/2021 1525 by Thressa Sheller, RN Outcome: Progressing Goal: Cardiovascular complication will be avoided 01/25/2021 1525 by Thressa Sheller, RN Outcome: Progressing 01/25/2021 1525 by Thressa Sheller, RN Outcome: Progressing   Problem: Activity: Goal: Risk for  activity intolerance will decrease 01/25/2021 1525 by Thressa Sheller, RN Outcome: Progressing 01/25/2021 1525 by Thressa Sheller, RN Outcome: Progressing   Problem: Nutrition: Goal: Adequate nutrition will be maintained Outcome: Progressing   Problem: Coping: Goal: Level of anxiety will decrease 01/25/2021 1525 by Thressa Sheller, RN Outcome: Progressing 01/25/2021 1525 by Thressa Sheller, RN Outcome: Progressing   Problem: Elimination: Goal: Will not experience complications related to bowel motility Outcome: Progressing Goal: Will not experience complications related to urinary retention Outcome: Progressing   Problem: Pain Managment: Goal: General experience of comfort will improve Outcome: Progressing   Problem: Safety: Goal: Ability to remain free from injury will improve Outcome: Progressing   Problem: Skin Integrity: Goal: Risk for impaired skin integrity will decrease Outcome: Progressing

## 2021-01-25 NOTE — Plan of Care (Signed)
  Problem: Education: Goal: Ability to demonstrate management of disease process will improve Outcome: Progressing   Problem: Education: Goal: Ability to verbalize understanding of medication therapies will improve Outcome: Progressing   Problem: Activity: Goal: Capacity to carry out activities will improve Outcome: Progressing   Problem: Cardiac: Goal: Ability to achieve and maintain adequate cardiopulmonary perfusion will improve Outcome: Progressing   Problem: Education: Goal: Knowledge of General Education information will improve Description: Including pain rating scale, medication(s)/side effects and non-pharmacologic comfort measures Outcome: Progressing   Problem: Clinical Measurements: Goal: Ability to maintain clinical measurements within normal limits will improve Outcome: Progressing   Problem: Activity: Goal: Risk for activity intolerance will decrease Outcome: Progressing   Problem: Nutrition: Goal: Adequate nutrition will be maintained Outcome: Progressing   Problem: Coping: Goal: Level of anxiety will decrease Outcome: Progressing   Problem: Pain Managment: Goal: General experience of comfort will improve Outcome: Progressing   Problem: Skin Integrity: Goal: Risk for impaired skin integrity will decrease Outcome: Progressing   Problem: Safety: Goal: Ability to remain free from injury will improve Outcome: Progressing

## 2021-01-25 NOTE — Progress Notes (Signed)
Patient ID: Todd Romero, male   DOB: May 24, 1934, 87 y.o.   MRN: [id number]     Advanced Heart Failure Rounding Note  PCP-Cardiologist: Sinclair Grooms, MD  [facility]: Dr. Aundra Dubin EP: Dr. 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  Recent admit 1/22 w/ a/c CHF in setting of persistent Afib, underwent DCCV, diuresed w/ IV lasix and required milrinone during admission.  Now readmitted w/ recurrent a/c CHF w/ marked fluid overload and NYHA Class IIIb symptoms, failing escalation of home diuretics.    Subjective:    Co-ox 62%, remains on milrinone 0.25.  I/Os net negative 873 with weight down 4 lbs.   CVP 15 cm.   Creatinine stable 2.73 => 2.65. Na 127.   Had a good night last night.  No dyspnea at rest.    Objective:   Weight Range: 99 kg Body mass index is 34.18 kg/m.   Vital Signs:   Temp:  [97.4 F (36.3 C)-98.9 F (37.2 C)] 97.4 F (36.3 C) (05/22 0402) Pulse Rate:  [70] 70 (05/22 0402) Resp:  [18-20] 20 (05/22 0402) BP: (108-132)/(57-73) 109/57 (05/22 0402) SpO2:  [91 %-99 %] 95 % (05/22 0402) Weight:  [99 kg] 99 kg (05/22 0500) Last BM Date: 01/24/21  Weight change: Filed Weights   01/23/21 0246 01/24/21 0323 01/25/21 0500  Weight: 101 kg 101.1 kg 99 kg    Intake/Output:   Intake/Output Summary (Last 24 hours) at 01/25/2021 1006 Last data filed at 01/25/2021 0900 Gross per 24 hour  Intake 1880.61 ml  Output 2100 ml  Net -219.39 ml      Physical Exam    General: NAD Neck: JVP 14-16, no thyromegaly or thyroid nodule.  Lungs: Clear to auscultation bilaterally with normal respiratory effort. CV: Nondisplaced PMI.  Heart regular S1/S2, no S3/S4, no murmur. 2+ edema to knees. Abdomen: Soft, nontender, no  hepatosplenomegaly, no distention.  Skin: Intact without lesions or rashes.  Neurologic: Alert and oriented x 3.  Psych: Normal affect. Extremities: No clubbing or cyanosis.  HEENT: Normal.    Telemetry   ?NSR with BiV pacing (personally reviewed)   Labs    CBC Recent Labs    01/25/21 0253  WBC 7.7  HGB 9.1*  HCT 27.6*  MCV 89.9  PLT 846   Basic Metabolic Panel Recent Labs    01/23/21 0315 01/24/21 0331 01/25/21 0253  NA 129* 128* 127*  K 3.0* 3.6 3.3*  CL 91* 89* 88*  CO2 29 29 29   GLUCOSE 154* 224* 267*  BUN 79* 79* 78*  CREATININE 2.80* 2.73* 2.65*  CALCIUM 8.1* 8.4* 8.4*  MG 2.2 2.3  --    Liver Function Tests No results for input(s): AST, ALT, ALKPHOS, BILITOT, PROT, ALBUMIN in the last 72 hours. No results for input(s): LIPASE, AMYLASE in the last 72 hours. Cardiac Enzymes No results for input(s): CKTOTAL, CKMB, CKMBINDEX, TROPONINI in the last 72 hours.  BNP: BNP (last 3 results) Recent Labs    10/01/20 1710 01/21/21 1821  BNP 433.6* 1,245.4*    ProBNP (last 3 results) Recent Labs    01/30/20 1351 11/06/20 1218  PROBNP 1,776* 630.0*     D-Dimer No results for input(s): DDIMER in the last 72 hours. Hemoglobin  A1C No results for input(s): HGBA1C in the last 72 hours. Fasting Lipid Panel No results for input(s): CHOL, HDL, LDLCALC, TRIG, CHOLHDL, LDLDIRECT in the last 72 hours. Thyroid Function Tests No results for input(s): TSH, T4TOTAL, T3FREE, THYROIDAB in the last 72 hours.  Invalid input(s): FREET3  Other results:   Imaging    No results found.   Medications:     Scheduled Medications: . amiodarone  200 mg Oral Daily  . apixaban  2.5 mg Oral BID  . Chlorhexidine Gluconate Cloth  6 each Topical Daily  . docusate sodium  100 mg Oral BID  . ferrous sulfate  325 mg Oral BID WC  . glimepiride  2 mg Oral QAC breakfast  . insulin aspart  0-15 Units Subcutaneous TID WC  . insulin NPH Human  26 Units Subcutaneous QHS  .  metolazone  5 mg Oral BID  . pantoprazole  40 mg Oral BID  . pravastatin  80 mg Oral q1800  . sodium chloride flush  10-40 mL Intracatheter Q12H  . sodium chloride flush  3 mL Intravenous Q12H  . tamsulosin  0.4 mg Oral QPC supper  . tolvaptan  15 mg Oral Once    Infusions: . sodium chloride    . furosemide (LASIX) 200 mg in dextrose 5% 100 mL (2mg /mL) infusion 20 mg/hr (01/25/21 0215)  . milrinone 0.25 mcg/kg/min (01/25/21 0813)    PRN Medications: sodium chloride, acetaminophen, hydrOXYzine, polyethylene glycol, sodium chloride flush, sodium chloride flush, traZODone     Assessment/Plan   1. Acute on Chronic Systolic CHF: Suspect mixed ischemic/nonischemic cardiomyopathy.  Atrial fibrillation and excessive RV pacing may contribute to cardiomyopathy. Last cath in 2018 pre-TAVR with occluded D2 and 90% stenosis left PDA. He had MDT PPM for symptomatic bradycardia in 2/21, upgraded to CRT-P in 3/22 due to high percentage of RV pacing. TEE (2/22) with EF 25-30%, mildly decreased RV systolic function, normal function of TAVR valve.  PYP scan not suggestive of TTR cardiac amyloidosis.  He required milrinone during 2/22 admission.  CHF is complicated by CKD stage IV.  Continues to struggle w/ marked fluid overload and NYHA Class IIIb symptoms, failing recent escalation of home diuretics. Admitted for IV diuretics and milrinone to help w/ diuresis. He is on milrinone 0.25, co-ox 62% today.  Some diuresis with weight down but not marked, creatinine mildly lower at 2.65.  Still volume overloaded with CVP 15.  - Continue milrinone 0.25  - Continue Lasix gtt 20 mg/hr and will give metolazone 5 mg bid again today. Replace K.  - With hyponatremia, will give tolvaptan 15 mg x 1 in the effort to trigger some diuresis.  - No ARNI, spironolactone with CKD stage IV.  - He did not tolerate Wilder Glade - c/w UNNA boots  - Avoiding beta blocker with marked volume overload and concern for low output HF  requiring milrinone  - Not candidate for advanced therapies with age and CKD stage IV. Seen by palliative care, wants to be full code for now.  2. Atrial fibrillation: Paroxysmal. TEE-DCCV on 10/07/20 initially converted him to NSR but back to atrial fibrillation soon after. Converted back to NSR on 10/08/20 with amiodarone.  ECG on 5/21 showed BiV pacing, possibly atrial fibrillation underlying.  - Continue amio 200 mg daily.  Recent LFTs/TSH normal, will need regular eye exam.  - Continue Eliquis 2.5 mg bid. There has been consideration for [place], but he is not falling frequently and does not have GI bleeding history.  Given CKD and uncontrolled CHF, would avoid the additional procedure.  3. CAD: Cath in 2018 pre-TAVR with occluded D2 and 90% stenosis left PDA, medically managed.  No chest pain.  - On Eliquis so no aspirin.  - Continue pravastatin, good lipids in 4/22.  4. CKD Stage IV: baseline SCr 2-3 range, stable at 2.65 today.  - followed by outpatient nephrology.  5. Bradycardia --> PPM [employer] --> upgraded to CRT-P on 12/01/20 6. Anxiety: avoid xanax as he had SEs in the past.  - prn hydroxyzine  - Trazodone qhs prn 7. Hyponatremia: Hypovolemic.  - 1500 cc po fluid restriction.  - Tolvaptan 15 mg x 1 today to try to help with diuresis.   Length of Stay: 4  [name], MD  01/25/2021, 10:06 AM  Advanced Heart Failure Team Pager [phone] (M-F; 7a - 5p)  Please contact [facility] for night-coverage after hours (5p -7a ) and weekends on [email or web address]

## 2021-01-26 ENCOUNTER — Telehealth: Payer: Self-pay

## 2021-01-26 DIAGNOSIS — F419 Anxiety disorder, unspecified: Secondary | ICD-10-CM

## 2021-01-26 DIAGNOSIS — I509 Heart failure, unspecified: Secondary | ICD-10-CM

## 2021-01-26 LAB — GLUCOSE, CAPILLARY
Glucose-Capillary: 117 mg/dL — ABNORMAL HIGH (ref 70–99)
Glucose-Capillary: 204 mg/dL — ABNORMAL HIGH (ref 70–99)
Glucose-Capillary: 216 mg/dL — ABNORMAL HIGH (ref 70–99)
Glucose-Capillary: 233 mg/dL — ABNORMAL HIGH (ref 70–99)

## 2021-01-26 LAB — BASIC METABOLIC PANEL
Anion gap: 10 (ref 5–15)
BUN: 82 mg/dL — ABNORMAL HIGH (ref 8–23)
CO2: 30 mmol/L (ref 22–32)
Calcium: 8.4 mg/dL — ABNORMAL LOW (ref 8.9–10.3)
Chloride: 88 mmol/L — ABNORMAL LOW (ref 98–111)
Creatinine, Ser: 2.79 mg/dL — ABNORMAL HIGH (ref 0.61–1.24)
GFR, Estimated: 22 mL/min — ABNORMAL LOW (ref >60)
Glucose, Bld: 232 mg/dL — ABNORMAL HIGH (ref 70–99)
Potassium: 3.3 mmol/L — ABNORMAL LOW (ref 3.5–5.1)
Sodium: 128 mmol/L — ABNORMAL LOW (ref 135–145)

## 2021-01-26 LAB — COOXEMETRY PANEL
Carboxyhemoglobin: 0.1 % — ABNORMAL LOW (ref 0.5–1.5)
Methemoglobin: 0.9 % (ref 0.0–1.5)
O2 Saturation: 66.1 %
Total hemoglobin: 9.2 g/dL — ABNORMAL LOW (ref 12.0–16.0)

## 2021-01-26 LAB — CBC
HCT: 27.3 % — ABNORMAL LOW (ref 39.0–52.0)
Hemoglobin: 8.8 g/dL — ABNORMAL LOW (ref 13.0–17.0)
MCH: 28.9 pg (ref 26.0–34.0)
MCHC: 32.2 g/dL (ref 30.0–36.0)
MCV: 89.8 fL (ref 80.0–100.0)
Platelets: 178 10*3/uL (ref 150–400)
RBC: 3.04 MIL/uL — ABNORMAL LOW (ref 4.22–5.81)
RDW: 15.8 % — ABNORMAL HIGH (ref 11.5–15.5)
WBC: 7.6 10*3/uL (ref 4.0–10.5)
nRBC: 0 % (ref 0.0–0.2)

## 2021-01-26 LAB — MRSA PCR SCREENING: MRSA by PCR: NEGATIVE

## 2021-01-26 MED ORDER — CITALOPRAM HYDROBROMIDE 20 MG PO TABS
10.0000 mg | ORAL_TABLET | Freq: Every day | ORAL | Status: DC
  Administered 2021-01-26 – 2021-01-29 (×4): 10 mg via ORAL
  Filled 2021-01-26 (×4): qty 1

## 2021-01-26 MED ORDER — POTASSIUM CHLORIDE CRYS ER 20 MEQ PO TBCR
40.0000 meq | EXTENDED_RELEASE_TABLET | Freq: Three times a day (TID) | ORAL | Status: DC
  Administered 2021-01-26: 40 meq via ORAL
  Filled 2021-01-26: qty 2

## 2021-01-26 MED ORDER — METOLAZONE 2.5 MG PO TABS
5.0000 mg | ORAL_TABLET | Freq: Once | ORAL | Status: AC
  Administered 2021-01-26: 5 mg via ORAL
  Filled 2021-01-26: qty 2

## 2021-01-26 MED ORDER — POTASSIUM CHLORIDE CRYS ER 20 MEQ PO TBCR
40.0000 meq | EXTENDED_RELEASE_TABLET | ORAL | Status: AC
  Administered 2021-01-26 (×2): 40 meq via ORAL
  Filled 2021-01-26 (×2): qty 2

## 2021-01-26 NOTE — Telephone Encounter (Signed)
Stacy from [place] calling to request [place] for patient.   Please call Marzetta Board on secured line [phone] option [phone]

## 2021-01-26 NOTE — TOC Initial Note (Signed)
Transition of Care [facility]) - Initial/Assessment Note    Patient Details  Name: Todd Romero MRN: [id number] Date of Birth: 1933-12-03  Transition of [facility]) CM/SW Contact:    Zenon Mayo, RN Phone Number: 01/26/2021, 12:53 PM  Clinical Narrative:                 NCM spoke with wife, offered choice for HHPT and outpatient palliative services.  She states they had [facility] before and really enjoyed them would like them again.  She also stated would like to try [facility].  NCM made referral to [facility] with [facility] and made referral to [facility] for [place].  [facility] will take referral and Janeece Riggers will take referral. NCM faxed orders and demographics to [facility] at [phone]. Patient has walker , bsc and a cane at home.  TOC will continue to follow for dc needs.   Expected Discharge Plan: [place] Barriers to Discharge: Continued Medical Work up   Patient Goals and CMS Choice Patient states their goals for this hospitalization and ongoing recovery are:: return home CMS Medicare.gov Compare Post Acute Care list provided to:: Patient Represenative (must comment) Choice offered to / list presented to : Spouse  Expected Discharge Plan and Services Expected Discharge Plan: [place] In-house Referral: Hospice / Palliative Care Discharge Planning Services: CM Consult Post Acute Care Choice: [place] arrangements for the past 2 months: Single Family Home                   DME Agency: NA       HH Arranged: PT HH Agency: [place] Date [place]: 01/26/21 Time [place]: 1252 Representative spoke with at [place]: liberty rep  Prior Living Arrangements/Services Living arrangements for the past 2 months: Single Family Home Lives with:: Spouse Patient language and need for interpreter reviewed:: Yes Do you feel safe going back to the place where you live?:  Yes      Need for Family Participation in Patient Care: Yes (Comment) Care giver support system in place?: Yes (comment) Current home services: DME (walker, bsc, cane) Criminal Activity/Legal Involvement Pertinent to Current Situation/Hospitalization: No - Comment as needed  Activities of Daily Living Home Assistive Devices/Equipment: Cane (specify quad or straight) ADL Screening (condition at time of admission) Patient's cognitive ability adequate to safely complete daily activities?: Yes Is the patient deaf or have difficulty hearing?: Yes Does the patient have difficulty seeing, even when wearing glasses/contacts?: No Does the patient have difficulty concentrating, remembering, or making decisions?: No Patient able to express need for assistance with ADLs?: Yes Does the patient have difficulty dressing or bathing?: Yes Independently performs ADLs?: No Communication: Independent Dressing (OT): Independent Grooming: Independent Feeding: Independent Bathing: Needs assistance Is this a change from baseline?: Pre-admission baseline Toileting: Independent In/Out Bed: Needs assistance Is this a change from baseline?: Pre-admission baseline Does the patient have difficulty walking or climbing stairs?: Yes Weakness of Legs: Both Weakness of Arms/Hands: None  Permission Sought/Granted                  Emotional Assessment       Orientation: : Oriented to Self,Oriented to Place,Oriented to  Time,Oriented to Situation Alcohol / Substance Use: Not Applicable Psych Involvement: No (comment)  Admission diagnosis:  Acute on chronic systolic heart failure, NYHA class 3 ([place]) [I50.23] Patient Active Problem List   Diagnosis Date  Noted  . Acute on chronic systolic heart failure, NYHA class 3 ([place]) 01/21/2021  . Gastroesophageal reflux disease 11/05/2020  . Morbid obesity ([place]) 11/05/2020  . Anxiety 10/22/2020  . Generalized weakness 10/21/2020  . At high risk for falls 10/21/2020   . Tremor 10/18/2020  . Acute on chronic diastolic HF (heart failure) ([place]) 10/01/2020  . Thyroid nodule greater than or equal to 1 cm in diameter incidentally noted on imaging study 09/01/2020  . Spondylosis, cervical 09/01/2020  . Sleep disturbance 04/23/2020  . Tachycardia-bradycardia syndrome ([place]) 02/06/2020  . Pacemaker 11/08/2019  . Chronic anticoagulation 11/08/2019  . Benign prostatic hyperplasia 10/01/2019  . Stage 3b chronic kidney disease ([place]) 10/01/2019  . 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 Chronic combined systolic and diastolic CHF (congestive heart failure) ([place]) 03/27/2018  . Persistent atrial fibrillation ([place]) 03/04/2018  . GIB (gastrointestinal bleeding) 11/18/2017  . Anemia 11/16/2017  . 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 Aortic stenosis 12/18/2015  . Obesity (BMI 30-39.9) 06/18/2015  . Obstructive sleep apnea 02/11/2015  . Primary osteoarthritis of right knee 03/18/2014  . Essential hypertension, benign 06/21/2013    Class: Chronic  . RBBB   . CAD (coronary artery disease)     Class: Chronic  . Hyperlipidemia   . Type 2 diabetes mellitus ([place]) 05/02/2013  . Retinal tear, left 11/16/2011   PCP:  Ma Hillock, DO Pharmacy:   [id number] [place] [place] [address] Phone: [phone] Fax: [phone]  [facility] [place], [place] [address] [place] [address] Phone: [phone] Fax: [phone]     Social Determinants of Health (SDOH) Interventions    Readmission Risk Interventions Readmission Risk Prevention Plan 01/26/2021  Transportation Screening Complete  PCP or Specialist Appt within 3-5 Days Complete  HRI or [place] Complete  Social Work Consult for [place] Planning/Counseling Complete  Palliative Care Screening Complete  Medication Review Press photographer)  Complete  Some recent data might be hidden

## 2021-01-26 NOTE — Progress Notes (Signed)
Physical Therapy Treatment Patient Details Name: Todd Romero MRN: [id number] DOB: 1933/09/22 Today's Date: 01/26/2021    History of Present Illness 87 y/o male admitted 5/18 with edema, weakness and dyspnea with CHF exacerbation. 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    PT Comments    Pt received in chair, c/o feeling unwell but agreeable to therapy session and with good participation in seated exercises and stair negotiation training. Pt needing up to +2 minA to ascend/descend 3 steps with BUE support and for transfers. Pt given HEP handout to reinforce supine/seated strengthening exercises and encouraged him to perform multiple times a day for strengthening. Pt continues to benefit from PT services to progress toward functional mobility goals. Continue to recommend HHPT.   Follow Up Recommendations  Home health PT     Equipment Recommendations  None recommended by PT    Recommendations for Other Services       Precautions / Restrictions Precautions Precautions: Fall Restrictions Weight Bearing Restrictions: No    Mobility  Bed Mobility               General bed mobility comments: pt received/remained in recliner    Transfers Overall transfer level: Needs assistance Equipment used: Rolling walker (2 wheeled) Transfers: Sit to/from Stand Sit to Stand: Min assist         General transfer comment: cues for hand placement, pt needs lift and lowering assist this date and c/o moderate fatigue t/o, x3 total reps. Also able to perform x5 chair push-ups with UE/LE support with min guard/supervision for strengthening.  Ambulation/Gait Ambulation/Gait assistance: Min guard;Min assist;+2 safety/equipment Gait Distance (Feet): 10 Feet Assistive device: Rolling walker (2  wheeled) Gait Pattern/deviations: Step-through pattern;Decreased stride length;Trunk flexed     General Gait Details: cues for posture, proximity to RW, safety and direction. Pt limited by fatigue and deferred longer gait trial as pt awaiting working with OT and primary focus on stair training this date.   Stairs Stairs: Yes Stairs assistance: Min assist;+2 physical assistance;+2 safety/equipment Stair Management: With walker;Step to pattern;Forwards Number of Stairs: 3 General stair comments: pt performed in room with step stool and RW to simulate B railings. pt able to verbalize sequencing prior to performing, needs minA for stability and spouse also present to provide min guard due to pt c/o fatigue and nervous to perform; no overt LOB or buckling but mild lateral unsteadiness. anticipate he will do better at regular stairs with sturdy railing   Wheelchair Mobility    Modified Rankin (Stroke Patients Only)       Balance Overall balance assessment: Needs assistance   Sitting balance-Leahy Scale: Fair   Postural control: Posterior lean (initially) Standing balance support: Bilateral upper extremity supported Standing balance-Leahy Scale: Poor Standing balance comment: B UE support with heavy reliance, some posterior bias initially needing minA                            Cognition Arousal/Alertness: Suspect due to medications (drowsy) Behavior During Therapy: WFL for tasks assessed/performed Overall Cognitive Status: Within Functional Limits for tasks assessed                                 General Comments: pt  a bit drowsy this date, per spouse he had anti-anxiety meds in AM so may have contributed to this?; pt participatory but [facility] so needs 1-step commands and some repetition of cues      Exercises General Exercises - Lower Extremity Ankle Circles/Pumps: AROM;Both;10 reps;Seated Long Arc Quad: AROM;Both;10 reps;Seated Hip Flexion/Marching:  AROM;Both;Seated;15 reps Other Exercises Other Exercises: chair push-ups x5 reps with min guard to supervision for safety, good technique Other Exercises: TKA handout given for supine/seated strengthening exercises, crossed out assisted knee bending and knee precautions as they are not relevant.    General Comments General comments (skin integrity, edema, etc.): BLE wrapped in ace wraps and no visible drainage; BP 109/60 (75) seated pre-mobility and BP 115/75 (85) post-exertion; RR 20, SpO2 93-97% on RA with exertion; 4/10 modified RPE (Fatigue) at end of session      Pertinent Vitals/Pain Pain Assessment: Faces Faces Pain Scale: Hurts a little bit Pain Location: R>L leg Pain Descriptors / Indicators: Discomfort Pain Intervention(s): Limited activity within patient's tolerance;Monitored during session;Premedicated before session;Repositioned    Home Living                      Prior Function            PT Goals (current goals can now be found in the care plan section) Acute Rehab PT Goals Patient Stated Goal: be able to walk with the cane PT Goal Formulation: With patient Time For Goal Achievement: 02/06/21 Potential to Achieve Goals: Fair Progress towards PT goals: Progressing toward goals    Frequency    Min 3X/week      PT Plan Current plan remains appropriate    Co-evaluation              AM-PAC PT "6 Clicks" Mobility   Outcome Measure  Help needed turning from your back to your side while in a flat bed without using bedrails?: A Little Help needed moving from lying on your back to sitting on the side of a flat bed without using bedrails?: A Little Help needed moving to and from a bed to a chair (including a wheelchair)?: A Little Help needed standing up from a chair using your arms (e.g., wheelchair or bedside chair)?: A Little Help needed to walk in hospital room?: A Little Help needed climbing 3-5 steps with a railing? : A Lot 6 Click Score:  17    End of Session Equipment Utilized During Treatment: Gait belt Activity Tolerance: Patient tolerated treatment well Patient left: in chair;with call bell/phone within reach;Other (comment);with family/visitor present (chair alarm pad under him but not on, OT entering room) Nurse Communication: Mobility status;Other (comment) (could benefit from geomat cushion) PT Visit Diagnosis: Other abnormalities of gait and mobility (R26.89);Difficulty in walking, not elsewhere classified (R26.2);Muscle weakness (generalized) (M62.81)     Time: 0354-6568 PT Time Calculation (min) (ACUTE ONLY): 26 min  Charges:  $Gait Training: 8-22 mins $Therapeutic Exercise: 8-22 mins                     Milayna Rotenberg P., PTA Acute Rehabilitation Services Pager: [phone] Office: [place] 01/26/2021, 12:36 PM

## 2021-01-26 NOTE — TOC Progression Note (Signed)
Transition of Care (TOC) - Progression Note  Heart Failure   Patient Details  Name: Todd Romero MRN: [id number] Date of Birth: Oct 22, 1933  Transition of Care [facility]) CM/SW [place], [place] Phone Number: 01/26/2021, 2:55 PM  Clinical Narrative:    CSW spoke with the patient and his spouse at bedside and completed a very brief SDOH screening with the patient's spouse who reported not having social needs at this time but just preparing for home with palliative and getting him everything he needs for home care. CSW offered support and if needs arise to please reach out. CSW provided the patient's spouse with the social workers name, number and position and if they identify other needs to please reach out so that CSW can provide support.    Expected Discharge Plan: [place] Barriers to Discharge: Continued Medical Work up  Expected Discharge Plan and Services Expected Discharge Plan: [place] In-house Referral: Hospice / Palliative Care Discharge Planning Services: CM Consult Post Acute Care Choice: [place] arrangements for the past 2 months: Single Family Home                   DME Agency: NA       HH Arranged: PT HH Agency: [place] Date HH Agency Contacted: 01/26/21 Time [place]: 1252 Representative spoke with at [place]: liberty rep   Social Determinants of Health (SDOH) Interventions Food Insecurity Interventions: Intervention Not Indicated Financial Strain Interventions: Intervention Not Indicated Housing Interventions: Intervention Not Indicated Transportation Interventions: Intervention Not Indicated  Readmission Risk Interventions Readmission Risk Prevention Plan 01/26/2021  Transportation Screening Complete  PCP or Specialist Appt within 3-5 Days Complete  HRI or [place] Complete  Social Work Consult for [place] Planning/Counseling Complete   Palliative Care Screening Complete  Medication Review Press photographer) Complete  Some recent data might be hidden   [place], MSW, LCSWA [phone] Heart Failure Social Worker

## 2021-01-26 NOTE — Progress Notes (Addendum)
Occupational Therapy Treatment Patient Details Name: Todd Romero MRN: [id number] DOB: June 29, 1934 Today's Date: 01/26/2021    History of present illness 87 y/o male admitted 5/18 with edema, weakness and dyspnea with CHF exacerbation. 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   OT comments  Patient seated in recliner just finished with PT session and reports fatigued but agreeable to OT session.  Discussed bathroom setup, use of DME and safety.  Utilized RW to complete simulated toilet transfers with min assist.  Spouse at side and reports she assists him with shower transfers, pt/spouse agreeable to place [facility] in shower for "just in case" he gets tired and needs to rest.  Continue per POC to increase tolerance for ADL participation.    Follow Up Recommendations  No OT follow up    Equipment Recommendations  None recommended by OT (has BSC at home for shower chair)    Recommendations for Other Services      Precautions / Restrictions Precautions Precautions: Fall Restrictions Weight Bearing Restrictions: No       Mobility Bed Mobility               General bed mobility comments: in recliner    Transfers Overall transfer level: Needs assistance Equipment used: Rolling walker (2 wheeled) Transfers: Sit to/from Stand Sit to Stand: Min assist         General transfer comment: cueing for hand placement, min assist to power up and steady    Balance Overall balance assessment: Needs assistance   Sitting balance-Leahy Scale: Fair   Standing balance support: During functional activity;Bilateral upper extremity supported Standing balance-Leahy Scale: Poor Standing balance comment: relies on BUE support                           ADL either performed or assessed with  clinical judgement   ADL Overall ADL's : Needs assistance/impaired                         Toilet Transfer: Minimal assistance;Ambulation;RW           Functional mobility during ADLs: Minimal assistance;Rolling walker;Cueing for safety General ADL Comments: pt limited by decreased activity tolerance, generalized weakness     Vision       Perception     Praxis      Cognition Arousal/Alertness: Awake/alert Behavior During Therapy: WFL for tasks assessed/performed Overall Cognitive Status: Within Functional Limits for tasks assessed                                 General Comments: HOH but WFL, reports anxious but has had anti-anxiety meds           Shoulder Instructions       General Comments VSS    Pertinent Vitals/ Pain       Pain Assessment: Faces Faces Pain Scale: Hurts a little bit Pain Location: R>L leg Pain Descriptors / Indicators: Discomfort Pain Intervention(s): Limited activity within patient's tolerance;Monitored during session;Repositioned  Home Living  Prior Functioning/Environment              Frequency  Min 2X/week        Progress Toward Goals  OT Goals(current goals can now be found in the care plan section)  Progress towards OT goals: Progressing toward goals  Acute Rehab OT Goals Patient Stated Goal: be able to walk with the cane OT Goal Formulation: With patient  Plan Discharge plan remains appropriate;Frequency remains appropriate    Co-evaluation                 AM-PAC OT "6 Clicks" Daily Activity     Outcome Measure   Help from another person eating meals?: None Help from another person taking care of personal grooming?: A Little Help from another person toileting, which includes using toliet, bedpan, or urinal?: A Little Help from another person bathing (including washing, rinsing, drying)?: A Lot Help from another person to  put on and taking off regular upper body clothing?: A Little Help from another person to put on and taking off regular lower body clothing?: A Lot 6 Click Score: 17    End of Session Equipment Utilized During Treatment: Gait belt;Rolling walker  OT Visit Diagnosis: Unsteadiness on feet (R26.81);Other abnormalities of gait and mobility (R26.89);Muscle weakness (generalized) (M62.81) (decreased activity tolerance)   Activity Tolerance Patient tolerated treatment well   Patient Left in chair;with call bell/phone within reach;with chair alarm set;with family/visitor present;with nursing/sitter in room   Nurse Communication Mobility status        Time: 9485-4627 OT Time Calculation (min): 24 min  Charges: OT General Charges $OT Visit: 1 Visit OT Treatments $Self Care/Home Management : 23-37 mins  Jolaine Artist, OT Acute Rehabilitation Services Pager [phone] Office [phone]    Delight Stare 01/26/2021, 1:14 PM

## 2021-01-26 NOTE — Progress Notes (Signed)
Mobility Specialist: Progress Note   01/26/21 1521  Mobility  Activity Ambulated in hall  Level of Assistance Minimal assist, patient does 75% or more  Assistive Device Front wheel walker  Distance Ambulated (ft) 190 ft  Mobility Ambulated with assistance in hallway  Mobility Response Tolerated well  Mobility performed by Mobility specialist  $Mobility charge 1 Mobility   Pre-Mobility: 71 HR, 95% SpO2 Post-Mobility: 73 HR, 93% SpO2  Pt was standby assist to sit EOB from supine and minA to stand. Pt was asx during ambulation, back to bed after walk. Family member is present in the room.   [facility] Ruairi Stutsman Mobility Specialist Mobility Specialist Phone: [phone]

## 2021-01-26 NOTE — Plan of Care (Signed)
  Problem: Education: Goal: Ability to demonstrate management of disease process will improve Outcome: Progressing   Problem: Education: Goal: Ability to verbalize understanding of medication therapies will improve Outcome: Progressing   Problem: Activity: Goal: Capacity to carry out activities will improve Outcome: Progressing   Problem: Education: Goal: Knowledge of General Education information will improve Description: Including pain rating scale, medication(s)/side effects and non-pharmacologic comfort measures Outcome: Progressing   Problem: Clinical Measurements: Goal: Ability to maintain clinical measurements within normal limits will improve Outcome: Progressing   Problem: Clinical Measurements: Goal: Diagnostic test results will improve Outcome: Progressing   Problem: Activity: Goal: Risk for activity intolerance will decrease Outcome: Progressing   Problem: Nutrition: Goal: Adequate nutrition will be maintained Outcome: Progressing   Problem: Coping: Goal: Level of anxiety will decrease Outcome: Progressing   Problem: Pain Managment: Goal: General experience of comfort will improve Outcome: Progressing   Problem: Skin Integrity: Goal: Risk for impaired skin integrity will decrease Outcome: Progressing

## 2021-01-26 NOTE — Progress Notes (Signed)
[facility]) Hospital Liaison Note  Notified by Alden Hipp of patient/family request for [facility] Palliative services at home after discharge  [facility] hospital liaison will follow patient for discharge disposition.   Please call with any Hospice or Palliative related questions  Thank you for the opportunity to participate in this patient's care.   Jhonnie Garner, BSN, [employer] [phone]

## 2021-01-26 NOTE — Progress Notes (Addendum)
Patient ID: Todd Romero, male   DOB: September 01, 1934, 87 y.o.   MRN: [id number]     Advanced Heart Failure Rounding Note  PCP-Cardiologist: Sinclair Grooms, MD  [place]: Dr. Aundra Dubin EP: Dr. 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  Recent admit 1/22 w/ a/c CHF in setting of persistent Afib, underwent DCCV, diuresed w/ IV lasix and required milrinone during admission.  Now readmitted w/ recurrent a/c CHF w/ marked fluid overload and NYHA Class IIIb symptoms, failing escalation of home diuretics.    Subjective:   5/22 Given metolazone and tolvaptan in addition to lasix drip.   Co-ox 62%, remains on milrinone 0.25 + lasix drip.  I/Os net negative 3.6 with weight down 4 lbs.     Creatinine stable 2.73 => 2.65=>2.8 . Na 128   Feeling ok. Not sleeping well at night.      Objective:   Weight Range: 98.7 kg Body mass index is 34.08 kg/m.   Vital Signs:   Temp:  [98.5 F (36.9 C)-98.8 F (37.1 C)] 98.5 F (36.9 C) (05/23 0438) Pulse Rate:  [70-72] 72 (05/23 0438) Resp:  [18-20] 20 (05/23 0438) BP: (104-108)/(59-63) 108/63 (05/23 0438) SpO2:  [91 %-96 %] 91 % (05/23 0438) Weight:  [98.7 kg] 98.7 kg (05/23 0500) Last BM Date: 01/25/21  Weight change: Filed Weights   01/24/21 0323 01/25/21 0500 01/26/21 0500  Weight: 101.1 kg 99 kg 98.7 kg    Intake/Output:   Intake/Output Summary (Last 24 hours) at 01/26/2021 0924 Last data filed at 01/26/2021 0438 Gross per 24 hour  Intake 1438.58 ml  Output 5350 ml  Net -3911.42 ml      Physical Exam  CVP 12-13 sitting in the chair.  General: Sitting in the chairl. No resp difficulty HEENT: normal Neck: supple. JVP 11-12. Carotids 2+ bilat; no bruits. No lymphadenopathy or thryomegaly  appreciated. Cor: PMI nondisplaced. Regular rate & rhythm. No rubs, gallops or murmurs. Lungs: clear Abdomen: soft, nontender, nondistended. No hepatosplenomegaly. No bruits or masses. Good bowel sounds. Extremities: no cyanosis, clubbing, rash, R and LLE unna boots 1+ edema Neuro: alert & orientedx3, cranial nerves grossly intact. moves all 4 extremities w/o difficulty. Affect pleasant   Telemetry   V paced 70s   Labs    CBC Recent Labs    01/25/21 0253 01/26/21 0450  WBC 7.7 7.6  HGB 9.1* 8.8*  HCT 27.6* 27.3*  MCV 89.9 89.8  PLT 160 321   Basic Metabolic Panel Recent Labs    01/24/21 0331 01/25/21 0253 01/25/21 1947 01/26/21 0450  NA 128* 127* 125* 128*  K 3.6 3.3*  --  3.3*  CL 89* 88*  --  88*  CO2 29 29  --  30  GLUCOSE 224* 267*  --  232*  BUN 79* 78*  --  82*  CREATININE 2.73* 2.65*  --  2.79*  CALCIUM 8.4* 8.4*  --  8.4*  MG 2.3  --   --   --    Liver Function Tests No results for input(s): AST, ALT, ALKPHOS, BILITOT, PROT, ALBUMIN in the last 72 hours. No results for input(s): LIPASE, AMYLASE in the last 72 hours. Cardiac Enzymes No results for input(s): CKTOTAL, CKMB, CKMBINDEX, TROPONINI in the  last 72 hours.  BNP: BNP (last 3 results) Recent Labs    10/01/20 1710 01/21/21 1821  BNP 433.6* 1,245.4*    ProBNP (last 3 results) Recent Labs    01/30/20 1351 11/06/20 1218  PROBNP 1,776* 630.0*     D-Dimer No results for input(s): DDIMER in the last 72 hours. Hemoglobin A1C No results for input(s): HGBA1C in the last 72 hours. Fasting Lipid Panel No results for input(s): CHOL, HDL, LDLCALC, TRIG, CHOLHDL, LDLDIRECT in the last 72 hours. Thyroid Function Tests No results for input(s): TSH, T4TOTAL, T3FREE, THYROIDAB in the last 72 hours.  Invalid input(s): FREET3  Other results:   Imaging    No results found.   Medications:     Scheduled Medications: . amiodarone  200 mg Oral Daily  . apixaban  2.5 mg Oral BID  .  Chlorhexidine Gluconate Cloth  6 each Topical Daily  . docusate sodium  100 mg Oral BID  . ferrous sulfate  325 mg Oral BID WC  . glimepiride  2 mg Oral QAC breakfast  . insulin aspart  0-15 Units Subcutaneous TID WC  . insulin NPH Human  26 Units Subcutaneous QHS  . pantoprazole  40 mg Oral BID  . pravastatin  80 mg Oral q1800  . sodium chloride flush  10-40 mL Intracatheter Q12H  . sodium chloride flush  3 mL Intravenous Q12H  . tamsulosin  0.4 mg Oral QPC supper    Infusions: . sodium chloride    . furosemide (LASIX) 200 mg in dextrose 5% 100 mL (2mg /mL) infusion 20 mg/hr (01/26/21 0105)  . milrinone 0.25 mcg/kg/min (01/26/21 0712)    PRN Medications: sodium chloride, acetaminophen, hydrOXYzine, polyethylene glycol, sodium chloride flush, sodium chloride flush, traZODone     Assessment/Plan   1. Acute on Chronic Systolic CHF: Suspect mixed ischemic/nonischemic cardiomyopathy.  Atrial fibrillation and excessive RV pacing may contribute to cardiomyopathy. Last cath in 2018 pre-TAVR with occluded D2 and 90% stenosis left PDA. He had MDT PPM for symptomatic bradycardia in 2/21, upgraded to CRT-P in 3/22 due to high percentage of RV pacing. TEE (2/22) with EF 25-30%, mildly decreased RV systolic function, normal function of TAVR valve.  PYP scan not suggestive of TTR cardiac amyloidosis.  He required milrinone during 2/22 admission.  CHF is complicated by CKD stage IV.  Continues to struggle w/ marked fluid overload and NYHA Class IIIb symptoms, failing recent escalation of home diuretics. Admitted for IV diuretics and milrinone to help w/ diuresis. He is on milrinone 0.25, co-ox 66% today.   - Volume status improving. CVP down to 12-13. Continue lasix drip today and give another dose of metoalzone.  - No ARNI, spironolactone with CKD stage IV.  - He did not tolerate Wilder Glade - c/w UNNA boots  - Avoiding beta blocker with marked volume overload and concern for low output HF requiring  milrinone  - Not candidate for advanced therapies with age and CKD stage IV. Seen by palliative care, wants to be full code for now.  2. Atrial fibrillation: Paroxysmal. TEE-DCCV on 10/07/20 initially converted him to NSR but back to atrial fibrillation soon after. Converted back to NSR on 10/08/20 with amiodarone.  ECG on 5/21 showed BiV pacing, possibly atrial fibrillation underlying.  - Continue amio 200 mg daily.  Recent LFTs/TSH normal, will need regular eye exam.  - Continue Eliquis 2.5 mg bid. There has been consideration for Watchman, but he is not falling frequently and does not have GI bleeding history.  Given CKD and uncontrolled CHF, would avoid the additional procedure.  3. CAD: Cath in 2018 pre-TAVR with occluded D2 and 90% stenosis left PDA, medically managed.  No chest pain.  - On Eliquis so no aspirin.  - Continue pravastatin, good lipids in 4/22.  4. CKD Stage IV: baseline SCr 2-3 range, stable at 2.8 today.  - followed by outpatient nephrology.  5. Bradycardia --> PPM [employer] --> upgraded to CRT-P on 12/01/20 6. Anxiety: avoid xanax as he had SEs in the past.  - prn hydroxyzine  - Trazodone qhs prn 7. Hyponatremia: Hypovolemic. Sodium 127>128   - 1500 cc po fluid restriction.  - Given Tolvaptan 5/22 .  8. Decreased Mobility  PT following recommending HHPT. HH ordered 9. [place] -Palliative Care consulted for [place].   Length of Stay: [place], NP  01/26/2021, 9:24 AM  Advanced Heart Failure Team Pager [phone] (M-F; 7a - 5p)  Please contact [facility] for night-coverage after hours (5p -7a ) and weekends on [email or web address]  Patient seen with NP, agree with the above note.   Creatinine up to 2.79 from 2.65, Na 128, co-ox 66%.  CVP 12-13.  Overall seems to be feeling better.   General: NAD Neck: JVP 12 cm, no thyromegaly or thyroid nodule.  Lungs: Clear to auscultation bilaterally with normal respiratory effort. CV: Nondisplaced PMI.  Heart regular S1/S2, no S3/S4, no  murmur.  1+ edema to knees.  Abdomen: Soft, nontender, no hepatosplenomegaly, no distention.  Skin: Intact without lesions or rashes.  Neurologic: Alert and oriented x 3.  Psych: Normal affect. Extremities: No clubbing or cyanosis.  HEENT: Normal.   Diuresis improved yesterday, CVP coming down.  Still with some volume overload.  Remains on milrinone 0.25.  - Continue Lasix gtt 20 + metolazone 5 mg today.  With rising creatinine, may not be able to push him much further with aggressive diuresis.  - End-stage CHF + renal failure, not candidate for advanced therapies.  He may not be able to wean off milrinone with low output HF.  He is being followed by palliative care service, remains full code for now.  May need to consider home milrinone for symptom control.   [name] 01/26/2021 3:10 PM

## 2021-01-26 NOTE — Progress Notes (Signed)
    Progress Note from the Palliative Medicine Team at [facility]   Patient Name: Todd Romero        Date: 01/26/2021 DOB: 02-26-1934  Age: 87 y.o. MRN#: [id number] Attending Physician: Larey Dresser, MD Primary Care Physician: Ma Hillock, DO Admit Date: 01/21/2021   Medical records reviewed   87 y.o. male with a medical history significant for chronic systolic CHF (EF 66-06%), severe aortic stenosis s/p TAVR (2018), symptomatic bradycardia, RBBB s/p permanent pacemaker (10/2019), coronary artery disease, atrial fibrillation, insulin-dependent diabetes, stage IV CKD, severe OSA, hypertension, and carpal tunnel s/p bilateral carpal tunnel release.    Patient directly admitted from home with complaints of weakness and shortness of breath, despite medication adjustements. Recently seen by heart failure clinic and the paramedicine team with adjustments made to his medications with a goal of improving dyspnea and lower extremity edema.    Patient currently being followed by heart failure.  He is receiving milrinone, Lasix drip, and spironolactone.  Today is day 4 of this hospitalization, patient remains weak.  He is on a milrinone and Lasix drip.  Patient and family report intermittent episodes of anxiety.  Patient and family face treatment option decisions, advanced directive decisions and anticipatory care needs.   This NP visited patient at the bedside as a follow up for palliative medicine needs and emotional support.  Patient is OOB to chair, alert and oriented.  Wife/Todd Romero at the bedside  Continued conversation regarding current medical situation.    Both patient and his wife verbalize an understanding of the seriousness of the patient's current medical situation.  Patient's wife is tearful.  Mr. Todd Romero verbalizes his main goal is "to be at home".  Education offered on the difference between a full  medical scope approach versus a comfort approach.  Education offered  on the limitations of medical interventions to provide quality of life when the body fails to thrive.  Education offered on home hospice benefit.  MOST form introduced.  Discussed with patient and his wife  the importance of continued conversation with each other, family ( they have four children) and the medical providers regarding overall plan of care and treatment options,  ensuring decisions are within the context of the patients values and GOCs.  Questions and concerns addressed   Discussed with Darrick Grinder NP  Total time spent on the unit was 45 minutes  PMT will follow up in the morning for continued clarification of goals of care.  Greater than 50% of the time was spent in counseling and coordination of care  Wadie Lessen NP  Palliative Medicine Team Team Phone # [phone] Pager [phone]

## 2021-01-27 DIAGNOSIS — R531 Weakness: Secondary | ICD-10-CM

## 2021-01-27 LAB — COOXEMETRY PANEL
Carboxyhemoglobin: 1.1 % (ref 0.5–1.5)
Methemoglobin: 1 % (ref 0.0–1.5)
O2 Saturation: 65.4 %
Total hemoglobin: 14 g/dL (ref 12.0–16.0)

## 2021-01-27 LAB — BASIC METABOLIC PANEL
Anion gap: 10 (ref 5–15)
Anion gap: 12 (ref 5–15)
BUN: 80 mg/dL — ABNORMAL HIGH (ref 8–23)
BUN: 78 mg/dL — ABNORMAL HIGH (ref 8–23)
CO2: 31 mmol/L (ref 22–32)
CO2: 29 mmol/L (ref 22–32)
Calcium: 8.6 mg/dL — ABNORMAL LOW (ref 8.9–10.3)
Calcium: 8.3 mg/dL — ABNORMAL LOW (ref 8.9–10.3)
Chloride: 87 mmol/L — ABNORMAL LOW (ref 98–111)
Chloride: 86 mmol/L — ABNORMAL LOW (ref 98–111)
Creatinine, Ser: 2.55 mg/dL — ABNORMAL HIGH (ref 0.61–1.24)
Creatinine, Ser: 2.64 mg/dL — ABNORMAL HIGH (ref 0.61–1.24)
GFR, Estimated: 24 mL/min — ABNORMAL LOW (ref >60)
GFR, Estimated: 23 mL/min — ABNORMAL LOW (ref >60)
Glucose, Bld: 250 mg/dL — ABNORMAL HIGH (ref 70–99)
Glucose, Bld: 427 mg/dL — ABNORMAL HIGH (ref 70–99)
Potassium: 2.9 mmol/L — ABNORMAL LOW (ref 3.5–5.1)
Potassium: 3.6 mmol/L (ref 3.5–5.1)
Sodium: 128 mmol/L — ABNORMAL LOW (ref 135–145)
Sodium: 127 mmol/L — ABNORMAL LOW (ref 135–145)

## 2021-01-27 LAB — GLUCOSE, CAPILLARY
Glucose-Capillary: 123 mg/dL — ABNORMAL HIGH (ref 70–99)
Glucose-Capillary: 208 mg/dL — ABNORMAL HIGH (ref 70–99)
Glucose-Capillary: 295 mg/dL — ABNORMAL HIGH (ref 70–99)
Glucose-Capillary: 176 mg/dL — ABNORMAL HIGH (ref 70–99)

## 2021-01-27 LAB — CBC
HCT: 27.8 % — ABNORMAL LOW (ref 39.0–52.0)
Hemoglobin: 9.3 g/dL — ABNORMAL LOW (ref 13.0–17.0)
MCH: 29.7 pg (ref 26.0–34.0)
MCHC: 33.5 g/dL (ref 30.0–36.0)
MCV: 88.8 fL (ref 80.0–100.0)
Platelets: 191 10*3/uL (ref 150–400)
RBC: 3.13 MIL/uL — ABNORMAL LOW (ref 4.22–5.81)
RDW: 15.8 % — ABNORMAL HIGH (ref 11.5–15.5)
WBC: 8.3 10*3/uL (ref 4.0–10.5)
nRBC: 0 % (ref 0.0–0.2)

## 2021-01-27 LAB — MAGNESIUM: Magnesium: 2.1 mg/dL (ref 1.7–2.4)

## 2021-01-27 MED ORDER — POTASSIUM CHLORIDE CRYS ER 20 MEQ PO TBCR
40.0000 meq | EXTENDED_RELEASE_TABLET | ORAL | Status: AC
  Administered 2021-01-27 (×4): 40 meq via ORAL
  Filled 2021-01-27 (×4): qty 2

## 2021-01-27 MED ORDER — TORSEMIDE 20 MG PO TABS
100.0000 mg | ORAL_TABLET | Freq: Two times a day (BID) | ORAL | Status: DC
  Administered 2021-01-27 – 2021-01-29 (×4): 100 mg via ORAL
  Filled 2021-01-27 (×4): qty 5

## 2021-01-27 NOTE — Telephone Encounter (Signed)
V/o for to continue palliative after hos discharge

## 2021-01-27 NOTE — Progress Notes (Addendum)
Patient ID: Todd Romero, male   DOB: May 28, 1934, 87 y.o.   MRN: [id number]     Advanced Heart Failure Rounding Note  PCP-Cardiologist: Sinclair Grooms, MD  [facility]: Dr. Aundra Dubin EP: Dr. 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  Recent admit 1/22 w/ a/c CHF in setting of persistent Afib, underwent DCCV, diuresed w/ IV lasix and required milrinone during admission.  Now readmitted w/ recurrent a/c CHF w/ marked fluid overload and NYHA Class IIIb symptoms, failing escalation of home diuretics.    Subjective:    Co-ox 65%, remains on milrinone 0.25 + lasix drip at 20/hr.  Good diuresis yesterday. 4.5L in UOP. Wt down another 7 lb.  CVP 8   Creatinine down 2.73 => 2.65=>2.8=>2.55. Na 128   K 2.9  Mg 2.1   Palliative care team following. Patient/family request for [facility] Palliative services at home after discharge.   Sitting up in bed eating breakfast. Feels ok. Slept well. No complaints. Wife at bedside.      Objective:   Weight Range: 95.4 kg Body mass index is 32.94 kg/m.   Vital Signs:   Temp:  [98.1 F (36.7 C)-98.4 F (36.9 C)] 98.2 F (36.8 C) (05/24 0343) Pulse Rate:  [69-80] 73 (05/24 0343) Resp:  [16-20] 20 (05/24 0343) BP: (101-115)/(55-66) 107/66 (05/24 0343) SpO2:  [99 %-100 %] 99 % (05/24 0343) Weight:  [95.4 kg] 95.4 kg (05/24 0500) Last BM Date: 01/26/21  Weight change: Filed Weights   01/25/21 0500 01/26/21 0500 01/27/21 0500  Weight: 99 kg 98.7 kg 95.4 kg    Intake/Output:   Intake/Output Summary (Last 24 hours) at 01/27/2021 0711 Last data filed at 01/27/2021 0600 Gross per 24 hour  Intake 1077 ml  Output 4550 ml  Net -3473 ml      Physical Exam   CVP 8  General:  Well appearing elderly male.  No respiratory difficulty HEENT: normal Neck: supple. JVD 8-9 cm. Carotids 2+ bilat; no bruits. No lymphadenopathy or thyromegaly appreciated. Cor: PMI nondisplaced. Regular rate & rhythm. No rubs, gallops or murmurs. Lungs: clear Abdomen: soft, nontender, nondistended. No hepatosplenomegaly. No bruits or masses. Good bowel sounds. Extremities: no cyanosis, clubbing, rash, edema + bilateral unna boots  Neuro: alert & oriented x 3, cranial nerves grossly intact. moves all 4 extremities w/o difficulty. Affect pleasant.    Telemetry   V paced 70s   Labs    CBC Recent Labs    01/26/21 0450 01/27/21 0400  WBC 7.6 8.3  HGB 8.8* 9.3*  HCT 27.3* 27.8*  MCV 89.8 88.8  PLT 178 622   Basic Metabolic Panel Recent Labs    01/26/21 0450 01/27/21 0400  NA 128* 128*  K 3.3* 2.9*  CL 88* 87*  CO2 30 31  GLUCOSE 232* 250*  BUN 82* 80*  CREATININE 2.79* 2.55*  CALCIUM 8.4* 8.6*  MG  --  2.1   Liver Function Tests No results for input(s): AST, ALT, ALKPHOS, BILITOT, PROT, ALBUMIN in the last 72 hours. No results for input(s): LIPASE, AMYLASE in the last 72 hours. Cardiac Enzymes No results for input(s): CKTOTAL, CKMB, CKMBINDEX, TROPONINI in the last 72 hours.  BNP: BNP (last 3 results) Recent Labs    10/01/20  1710 01/21/21 1821  BNP 433.6* 1,245.4*    ProBNP (last 3 results) Recent Labs    01/30/20 1351 11/06/20 1218  PROBNP 1,776* 630.0*     D-Dimer No results for input(s): DDIMER in the last 72 hours. Hemoglobin A1C No results for input(s): HGBA1C in the last 72 hours. Fasting Lipid Panel No results for input(s): CHOL, HDL, LDLCALC, TRIG, CHOLHDL, LDLDIRECT in the last 72 hours. Thyroid Function Tests No results for input(s): TSH, T4TOTAL, T3FREE, THYROIDAB in the last 72 hours.  Invalid input(s): FREET3  Other results:   Imaging    No results found.   Medications:     Scheduled Medications: . amiodarone  200 mg Oral Daily  . apixaban  2.5 mg  Oral BID  . Chlorhexidine Gluconate Cloth  6 each Topical Daily  . citalopram  10 mg Oral Daily  . docusate sodium  100 mg Oral BID  . ferrous sulfate  325 mg Oral BID WC  . glimepiride  2 mg Oral QAC breakfast  . insulin aspart  0-15 Units Subcutaneous TID WC  . insulin NPH Human  26 Units Subcutaneous QHS  . pantoprazole  40 mg Oral BID  . pravastatin  80 mg Oral q1800  . sodium chloride flush  10-40 mL Intracatheter Q12H  . sodium chloride flush  3 mL Intravenous Q12H  . tamsulosin  0.4 mg Oral QPC supper    Infusions: . sodium chloride    . furosemide (LASIX) 200 mg in dextrose 5% 100 mL (2mg /mL) infusion 20 mg/hr (01/27/21 0004)  . milrinone 0.25 mcg/kg/min (01/26/21 2044)    PRN Medications: sodium chloride, acetaminophen, hydrOXYzine, polyethylene glycol, sodium chloride flush, sodium chloride flush, traZODone     Assessment/Plan   1. Acute on Chronic Systolic CHF: Suspect mixed ischemic/nonischemic cardiomyopathy.  Atrial fibrillation and excessive RV pacing may contribute to cardiomyopathy. Last cath in 2018 pre-TAVR with occluded D2 and 90% stenosis left PDA. He had MDT PPM for symptomatic bradycardia in 2/21, upgraded to CRT-P in 3/22 due to high percentage of RV pacing. TEE (2/22) with EF 25-30%, mildly decreased RV systolic function, normal function of TAVR valve.  PYP scan not suggestive of TTR cardiac amyloidosis.  He required milrinone during 2/22 admission.  CHF is complicated by CKD stage IV.  Continues to struggle w/ marked fluid overload and NYHA Class IIIb symptoms, failing recent escalation of home diuretics. Admitted for IV diuretics and milrinone to help w/ diuresis. He is on milrinone 0.25, co-ox 65% today.   - Volume status improving. CVP down to 8. Continue lasix drip. Hold metolazone and aggressively supp K. Likely transition back to PO diuretics in next 24 hrs - No ARNI, spironolactone with CKD stage IV.  - He did not tolerate Wilder Glade - c/w UNNA boots   - Avoiding beta blocker with marked volume overload and concern for low output HF requiring milrinone  - Not candidate for advanced therapies with age and CKD stage IV. Seen by palliative care, wants to be full code for now. Patient/family request for [facility] Palliative services at home after discharge.  2. Atrial fibrillation: Paroxysmal. TEE-DCCV on 10/07/20 initially converted him to NSR but back to atrial fibrillation soon after. Converted back to NSR on 10/08/20 with amiodarone.  ECG on 5/21 showed BiV pacing, possibly atrial fibrillation underlying.  - Continue amio 200 mg daily.  Recent LFTs/TSH normal, will need regular eye exam.  - Continue Eliquis 2.5 mg bid. There has been consideration for [facility], but he is  not falling frequently and does not have GI bleeding history.  Given CKD and uncontrolled CHF, would avoid the additional procedure.  3. CAD: Cath in 2018 pre-TAVR with occluded D2 and 90% stenosis left PDA, medically managed.  No chest pain.  - On Eliquis so no aspirin.  - Continue pravastatin, good lipids in 4/22.  4. CKD Stage IV: baseline SCr 2-3 range, stable at 2.6 today.  - followed by outpatient nephrology.  5. Bradycardia --> PPM [employer] --> upgraded to CRT-P on 12/01/20 6. Anxiety: avoid xanax as he had SEs in the past.  - prn hydroxyzine  - Trazodone qhs prn 7. Hyponatremia: Hypovolemic. Sodium 127>128 >128 - 1500 cc po fluid restriction.  - Given Tolvaptan 5/22 .  8. Decreased Mobility  - PT following recommending HHPT. HH ordered 9. [place] -Palliative Care consulted for [place]. Patient/family request for [facility] Palliative services at home after discharge.   Length of Stay: [address], [place]  01/27/2021, 7:11 AM  Advanced Heart Failure Team Pager [phone] (M-F; 7a - 5p)  Please contact [facility] for night-coverage after hours (5p -7a ) and weekends on [email or web address]  Patient seen with PA, agree with the above note.   CVP 10 today, creatinine down to 2.55.  Co-ox 65% on milrinone 0.25.   Weight down 7 lbs.    Has been having conversations with palliative care.   General: NAD Neck: JVP 8-9 cm, no thyromegaly or thyroid nodule.  Lungs: Clear to auscultation bilaterally with normal respiratory effort. CV: Nondisplaced PMI.  Heart regular S1/S2, no S3/S4, no murmur.  No peripheral edema.   Abdomen: Soft, nontender, no hepatosplenomegaly, no distention.  Skin: Intact without lesions or rashes.  Neurologic: Alert and oriented x 3.  Psych: Normal affect. Extremities: No clubbing or cyanosis.  HEENT: Normal.   Weight now down about 15 lbs.  CVP down to 10.  - Stop Lasix gtt.  - Start torsemide 100 mg bid, follow diuresis with torsemide while on milrinone.    He is doing better on milrinone with good co-ox and creatinine down to 2.55.  He was failing outpatient management on high dose diuretics (torsemide 100 mg bid + metolazone 2.5 tiw) due to lack of diuresis and progressive renal dysfunction. I think that if we take him off milrinone, he will have the same problem and be back in the hospital soon.  Extensive discussions so far with palliative care, he is not ready for hospice yet.  He and his wife understand that medical options are very limited at this point.  - I think it would be reasonable to try him on milrinone 0.25 at home, with the understanding that this is not life-prolonging but may keep him out of the hospital.   [name] 01/27/2021 10:57 AM

## 2021-01-27 NOTE — Progress Notes (Addendum)
    Progress Note from the Palliative Medicine Team at [facility]   Patient Name: Todd Romero        Date: 01/27/2021 DOB: 03/02/1934  Age: 87 y.o. MRN#: [id number] Attending Physician: Romero, Todd S, MD Primary Care Physician: Romero, Todd A, DO Admit Date: 01/21/2021   Medical records reviewed   87 y.o. male with a medical history significant for chronic systolic CHF (EF 35-40%), severe aortic stenosis s/p TAVR (2018), symptomatic bradycardia, RBBB s/p permanent pacemaker (10/2019), coronary artery disease, atrial fibrillation, insulin-dependent diabetes, stage IV CKD, severe OSA, hypertension, and carpal tunnel s/p bilateral carpal tunnel release.    Patient directly admitted from home with complaints of weakness and shortness of breath, despite medication adjustements. Recently seen by heart failure clinic and the paramedicine team with adjustments made to his medications with a goal of improving dyspnea and lower extremity edema.    Patient currently being followed by heart failure.  He is receiving milrinone, Lasix drip, and spironolactone.  Today is day 5 of this hospitalization, patient remains weak.  He is on a milrinone and Lasix drip.     Patient and family face ongoing  treatment option decisions, advanced directive decisions and anticipatory care needs.   This NP visited patient at the bedside as a follow up for palliative medicine needs and emotional support.  I met Todd Romero  and his wife/Todd Romero yesterday  Patient is resting comfortably and  wife/Todd Romero is at the bedside  Continued conversation regarding current medical situation.    Both patient and his wife verbalize an understanding of the seriousness of the patient's current medical situation.  Education offered on the difference between a full  medical scope approach versus a comfort approach.    Education offered on the limitations of medical interventions to provide quality of life when the body  fails to thrive.  Education offered on home hospice benefit vs home health with palliative.  Created space and opportunity for patient and his wife to explore their thoughts and feelings regarding current medical situation.  Both verbalized an understanding of the seriousness of his current medical situation, however they are hoping for "more time".  They speak to their love of family, their grandchildren and their wonderful life together.  Ultimately patient is hoping for continued quality of life.  They are open to home inotropes if eligible.   --Code Status Encouraged patient/family to consider DNR/DNI status understanding evidenced based poor outcomes in similar hospitalized patient, as the cause of arrest is likely associated with advanced chronic illness rather than an easily reversible acute cardio-pulmonary event.  MOST form introduced.  Discussed with patient and his wife  the importance of continued conversation with each other, family ( they have four children) and the medical providers regarding overall plan of care and treatment options,  ensuring decisions are within the context of the patients values and GOCs.  Questions and concerns addressed   Discussed with Todd Clegg NP  Total time spent on the unit was 35 minutes  PMT will follow up in the morning for continued clarification of goals of care.  Greater than 50% of the time was spent in counseling and coordination of care    NP  Palliative Medicine Team Team Phone # [phone] Pager [phone]    

## 2021-01-27 NOTE — Telephone Encounter (Signed)
Absolutely.  Continue palliative services after hospital discharge.

## 2021-01-27 NOTE — Telephone Encounter (Signed)
Spoke with Marzetta Board who stated that they want to continue palliative care for pt once discharge. A case manager at the hospital had reached out to them for this purpose

## 2021-01-27 NOTE — Progress Notes (Signed)
Inpatient Diabetes Program Recommendations  AACE/ADA: New Consensus Statement on Inpatient Glycemic Control (2015)  Target Ranges:  Prepandial:   less than 140 mg/dL      Peak postprandial:   less than 180 mg/dL (1-2 hours)      Critically ill patients:  140 - 180 mg/dL   Lab Results  Component Value Date   GLUCAP 123 (H) 01/27/2021   HGBA1C 7.2 (H) 01/21/2021    Review of Glycemic Control Results for CHIN, WACHTER (MRN [id number]) as of 01/27/2021 08:12  Ref. Range 01/26/2021 06:23 01/26/2021 12:20 01/26/2021 16:13 01/26/2021 21:27 01/27/2021 06:00  Glucose-Capillary Latest Ref Range: 70 - 99 mg/dL 117 (H) 204 (H) 216 (H) 233 (H) 123 (H)    Inpatient Diabetes Program Recommendations:    Novolog 2-3 units TID with meals if eats at least 50%.  Will continue to follow while inpatient.  Thank you, Reche Dixon, RN, BSN Diabetes Coordinator Inpatient Diabetes Program [phone] (team pager from 8a-5p)

## 2021-01-27 NOTE — Telephone Encounter (Signed)
What are they  Specifically asking from [place]?

## 2021-01-27 NOTE — Progress Notes (Signed)
Physical Therapy Treatment Patient Details Name: Todd Romero MRN: [id number] DOB: [birth year] Today's Date: 01/27/2021    History of Present Illness 87 y/o male admitted 5/18 with edema, weakness and dyspnea with CHF exacerbation. 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    PT Comments    Pt received in supine, agreeable to therapy session and with good tolerance for household distance gait training with RW and transfer training. He performed supine/seated BLE therapeutic exercises per handout with good tolerance, does need multimodal cues with increased time to perform properly. His gait speed remains very slow, ~0.1-0.2 m/s, indicating an increased risk of falls for household ambulation so encouraged him to use call bell for any OOB mobility and chair alarm on for safety. Plan to continue gait/stair training next session. Pt continues to benefit from PT services to progress toward functional mobility goals. Continue to recommend HHPT.   Follow Up Recommendations  Home health PT     Equipment Recommendations  None recommended by PT    Recommendations for Other Services       Precautions / Restrictions Precautions Precautions: Fall Restrictions Weight Bearing Restrictions: No    Mobility  Bed Mobility Overal bed mobility: Needs Assistance Bed Mobility: Supine to Sit     Supine to sit: Min assist;HOB elevated     General bed mobility comments: heavy use of bed features/rail and increased time to rise, needs minA trunk support    Transfers Overall transfer level: Needs assistance Equipment used: Rolling walker (2 wheeled) Transfers: Sit to/from Stand Sit to Stand: Min assist;From elevated surface         General transfer comment: cueing for hand placement, min assist to power up  and steady but needs x2 attempts to reach upright; minA also from chair<>RW and cues for hand placement  Ambulation/Gait Ambulation/Gait assistance: Min guard Gait Distance (Feet): 80 Feet Assistive device: Rolling walker (2 wheeled) Gait Pattern/deviations: Step-through pattern;Decreased stride length;Trunk flexed Gait velocity: <0.2 m/s Gait velocity interpretation: <1.8 ft/sec, indicate of risk for recurrent falls General Gait Details: cues for posture, proximity to RW, safety and direction. Pt reports moderate fatigue after achieving this distance and as distance progressed he was relying heavily on BUE support of RW; HR and SpO2 WNL on RA   Stairs             Wheelchair Mobility    Modified Rankin (Stroke Patients Only)       Balance Overall balance assessment: Needs assistance   Sitting balance-Leahy Scale: Fair     Standing balance support: During functional activity;Bilateral upper extremity supported Standing balance-Leahy Scale: Poor Standing balance comment: relies on BUE support                            Cognition Arousal/Alertness: Awake/alert Behavior During Therapy: WFL for tasks assessed/performed Overall Cognitive Status: Within Functional Limits for tasks assessed                                 General Comments: HOH but WFL, reports he was hoping to go home today because his 61th anniversary is tomorrow and disappointed he has to spend another night in the hospital.      Exercises General Exercises -  Lower Extremity Ankle Circles/Pumps: AROM;Both;10 reps;Seated Long Arc Quad: AROM;Both;10 reps;Seated Heel Slides: AROM;Both;10 reps;Supine Hip ABduction/ADduction: AROM;Both;10 reps;Supine Hip Flexion/Marching: AROM;Both;Seated;15 reps    General Comments General comments (skin integrity, edema, etc.): VSS on RA; BP 109/57 (74) supine/resting and stable post-exertion (did not record second reading but similar to first).  HR 64-88 bpm with exertion      Pertinent Vitals/Pain Pain Assessment: Faces Faces Pain Scale: Hurts a little bit Pain Location: R>L leg Pain Descriptors / Indicators: Discomfort Pain Intervention(s): Monitored during session;Repositioned;Premedicated before session;Limited activity within patient's tolerance    Home Living                      Prior Function            PT Goals (current goals can now be found in the care plan section) Acute Rehab PT Goals Patient Stated Goal: be able to walk with the cane PT Goal Formulation: With patient Time For Goal Achievement: 02/06/21 Potential to Achieve Goals: Fair Progress towards PT goals: Progressing toward goals    Frequency    Min 3X/week      PT Plan Current plan remains appropriate    Co-evaluation              AM-PAC PT "6 Clicks" Mobility   Outcome Measure  Help needed turning from your back to your side while in a flat bed without using bedrails?: A Little Help needed moving from lying on your back to sitting on the side of a flat bed without using bedrails?: A Little Help needed moving to and from a bed to a chair (including a wheelchair)?: A Little Help needed standing up from a chair using your arms (e.g., wheelchair or bedside chair)?: A Little Help needed to walk in hospital room?: A Little Help needed climbing 3-5 steps with a railing? : A Lot 6 Click Score: 17    End of Session Equipment Utilized During Treatment: Gait belt Activity Tolerance: Patient tolerated treatment well Patient left: in chair;with call bell/phone within reach;with chair alarm set Nurse Communication: Mobility status PT Visit Diagnosis: Other abnormalities of gait and mobility (R26.89);Difficulty in walking, not elsewhere classified (R26.2);Muscle weakness (generalized) (M62.81)     Time: 0017-4944 PT Time Calculation (min) (ACUTE ONLY): 35 min  Charges:  $Gait Training: 8-22 mins $Therapeutic Exercise: 8-22  mins                     Joshlyn Beadle P., PTA Acute Rehabilitation Services Pager: [phone] Office: [place] 01/27/2021, 1:40 PM

## 2021-01-27 NOTE — Telephone Encounter (Signed)
Please call contact person provided and obtain more information.  Currently patient is still hospitalized and it does not appear they are planning on discharging him soon.  It does appear palliative care has seen him as an inpatient this admission.

## 2021-01-28 ENCOUNTER — Inpatient Hospital Stay (HOSPITAL_COMMUNITY): Payer: Medicare Other

## 2021-01-28 ENCOUNTER — Encounter (HOSPITAL_COMMUNITY): Payer: Medicare Other

## 2021-01-28 ENCOUNTER — Ambulatory Visit (INDEPENDENT_AMBULATORY_CARE_PROVIDER_SITE_OTHER): Payer: Medicare Other

## 2021-01-28 DIAGNOSIS — Z66 Do not resuscitate: Secondary | ICD-10-CM

## 2021-01-28 DIAGNOSIS — I495 Sick sinus syndrome: Secondary | ICD-10-CM

## 2021-01-28 HISTORY — PX: IR US GUIDE VASC ACCESS RIGHT: IMG2390

## 2021-01-28 HISTORY — PX: IR FLUORO GUIDE CV LINE RIGHT: IMG2283

## 2021-01-28 LAB — COOXEMETRY PANEL
Carboxyhemoglobin: 0.2 % — ABNORMAL LOW (ref 0.5–1.5)
Methemoglobin: 0.4 % (ref 0.0–1.5)
O2 Saturation: 58.1 %
Total hemoglobin: 9.2 g/dL — ABNORMAL LOW (ref 12.0–16.0)

## 2021-01-28 LAB — GLUCOSE, CAPILLARY
Glucose-Capillary: 79 mg/dL (ref 70–99)
Glucose-Capillary: 218 mg/dL — ABNORMAL HIGH (ref 70–99)
Glucose-Capillary: 236 mg/dL — ABNORMAL HIGH (ref 70–99)
Glucose-Capillary: 200 mg/dL — ABNORMAL HIGH (ref 70–99)

## 2021-01-28 LAB — BASIC METABOLIC PANEL
Anion gap: 12 (ref 5–15)
BUN: 81 mg/dL — ABNORMAL HIGH (ref 8–23)
CO2: 30 mmol/L (ref 22–32)
Calcium: 8.7 mg/dL — ABNORMAL LOW (ref 8.9–10.3)
Chloride: 91 mmol/L — ABNORMAL LOW (ref 98–111)
Creatinine, Ser: 2.61 mg/dL — ABNORMAL HIGH (ref 0.61–1.24)
GFR, Estimated: 23 mL/min — ABNORMAL LOW (ref >60)
Glucose, Bld: 78 mg/dL (ref 70–99)
Potassium: 3.2 mmol/L — ABNORMAL LOW (ref 3.5–5.1)
Sodium: 133 mmol/L — ABNORMAL LOW (ref 135–145)

## 2021-01-28 LAB — CBC
HCT: 28.1 % — ABNORMAL LOW (ref 39.0–52.0)
Hemoglobin: 8.9 g/dL — ABNORMAL LOW (ref 13.0–17.0)
MCH: 28.7 pg (ref 26.0–34.0)
MCHC: 31.7 g/dL (ref 30.0–36.0)
MCV: 90.6 fL (ref 80.0–100.0)
Platelets: 199 10*3/uL (ref 150–400)
RBC: 3.1 MIL/uL — ABNORMAL LOW (ref 4.22–5.81)
RDW: 15.7 % — ABNORMAL HIGH (ref 11.5–15.5)
WBC: 8.5 10*3/uL (ref 4.0–10.5)
nRBC: 0 % (ref 0.0–0.2)

## 2021-01-28 MED ORDER — INSULIN NPH (HUMAN) (ISOPHANE) 100 UNIT/ML ~~LOC~~ SUSP
13.0000 [IU] | Freq: Two times a day (BID) | SUBCUTANEOUS | Status: DC
  Administered 2021-01-28 – 2021-01-29 (×2): 13 [IU] via SUBCUTANEOUS
  Filled 2021-01-28: qty 10

## 2021-01-28 MED ORDER — POTASSIUM CHLORIDE CRYS ER 20 MEQ PO TBCR
40.0000 meq | EXTENDED_RELEASE_TABLET | ORAL | Status: AC
  Administered 2021-01-28 (×3): 40 meq via ORAL
  Filled 2021-01-28 (×3): qty 2

## 2021-01-28 MED ORDER — LIDOCAINE HCL (PF) 1 % IJ SOLN
INTRAMUSCULAR | Status: DC | PRN
  Administered 2021-01-28: 10 mL

## 2021-01-28 MED ORDER — LIDOCAINE HCL (PF) 1 % IJ SOLN
INTRAMUSCULAR | Status: AC
  Filled 2021-01-28: qty 30

## 2021-01-28 NOTE — Telephone Encounter (Signed)
Spoke with Langley Gauss and gave the ok for continuing palliative care. But reminded that Dr. Raoul Pitch cannot be the attending for the palliative care but a palliative care doctor will take care of any orders

## 2021-01-28 NOTE — Plan of Care (Signed)

## 2021-01-28 NOTE — Procedures (Signed)
Interventional Radiology Procedure Note  Procedure: Placement of a right IJ approach single lumen, cuffed, tunneled CVC.  Tip is positioned at the superior cavoatrial junction and catheter is ready for immediate use.  Complications: None Recommendations:  - Ok to use - Do not submerge - Routine line care   Signed,  Dulcy Fanny. Earleen Newport, DO

## 2021-01-28 NOTE — Progress Notes (Signed)
Occupational Therapy Treatment Patient Details Name: Todd Romero MRN: [id number] DOB: 26-Jan-1934 Today's Date: 01/28/2021    History of present illness 87 y/o male admitted 5/18 with edema, weakness and dyspnea with CHF exacerbation. 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   OT comments  Pt continues to require min assist for transfers, cueing for hand placement and safety using RW.  Min assist to return back to bed.  Supportive spouse who will assist as needed at home for ADLs. Limited session as transport arrived to take to IR. Will follow acutely, DC plan remains appropriate.     Follow Up Recommendations  No OT follow up    Equipment Recommendations  None recommended by OT (has BSC at home to use as [place])    Recommendations for Other Services      Precautions / Restrictions Precautions Precautions: Fall Restrictions Weight Bearing Restrictions: No       Mobility Bed Mobility Overal bed mobility: Needs Assistance Bed Mobility: Sit to Supine       Sit to supine: Min assist   General bed mobility comments: requires assist to bring BLEs back to supine    Transfers Overall transfer level: Needs assistance Equipment used: Rolling walker (2 wheeled) Transfers: Sit to/from Stand Sit to Stand: Min assist         General transfer comment: cueing for hand placement and safety, min assist to power up from recliner    Balance Overall balance assessment: Needs assistance   Sitting balance-Leahy Scale: Fair     Standing balance support: Bilateral upper extremity supported;During functional activity Standing balance-Leahy Scale: Poor Standing balance comment: relies on BUE support                           ADL either performed or assessed with clinical judgement    ADL Overall ADL's : Needs assistance/impaired                         Toilet Transfer: Minimal assistance;Ambulation;RW Toilet Transfer Details (indicate cue type and reason): simulated to EOB from recliner         Functional mobility during ADLs: Minimal assistance;Rolling walker       Vision       Perception     Praxis      Cognition Arousal/Alertness: Awake/alert Behavior During Therapy: WFL for tasks assessed/performed Overall Cognitive Status: Within Functional Limits for tasks assessed                                          Exercises     Shoulder Instructions       General Comments VSS on RA    Pertinent Vitals/ Pain       Pain Assessment: Faces Faces Pain Scale: No hurt  Home Living                                          Prior Functioning/Environment              Frequency  Min 2X/week  Progress Toward Goals  OT Goals(current goals can now be found in the care plan section)  Progress towards OT goals: Progressing toward goals  Acute Rehab OT Goals Patient Stated Goal: be able to walk with the cane OT Goal Formulation: With patient  Plan Discharge plan remains appropriate;Frequency remains appropriate    Co-evaluation                 AM-PAC OT "6 Clicks" Daily Activity     Outcome Measure   Help from another person eating meals?: None Help from another person taking care of personal grooming?: A Little Help from another person toileting, which includes using toliet, bedpan, or urinal?: A Little Help from another person bathing (including washing, rinsing, drying)?: A Lot Help from another person to put on and taking off regular upper body clothing?: A Little Help from another person to put on and taking off regular lower body clothing?: A Lot 6 Click Score: 17    End of Session Equipment Utilized During Treatment: Rolling walker;Gait belt  OT Visit Diagnosis:  Unsteadiness on feet (R26.81);Other abnormalities of gait and mobility (R26.89);Muscle weakness (generalized) (M62.81)   Activity Tolerance Patient tolerated treatment well   Patient Left in chair;with call bell/phone within reach;with chair alarm set;with family/visitor present;with nursing/sitter in room   Nurse Communication Mobility status        Time: 8811-0315 OT Time Calculation (min): 9 min  Charges: OT General Charges $OT Visit: 1 Visit OT Treatments $Self Care/Home Management : 8-22 mins  Jolaine Artist, OT Acute Rehabilitation Services Pager [phone] Office [place] 01/28/2021, 11:44 AM

## 2021-01-28 NOTE — Progress Notes (Signed)
Mobility Specialist - Progress Note   01/28/21 1827  Mobility  Activity Ambulated in room  Level of Assistance Moderate assist, patient does 50-74%  Assistive Device Front wheel walker  Distance Ambulated (ft) 36 ft  Mobility Ambulated with assistance in room  Mobility Response Tolerated well  Mobility performed by Mobility specialist  $Mobility charge 1 Mobility   Pt mod assist to stand from recliner. His condom cath slipped off once standing and pt voided urine. Pt cleaned and gown changed. He then ambulated to his door and then was min assist to lay down in bed. Pt left in bed w/ call bell at side. VSS throughout.   Pricilla Handler Mobility Specialist Mobility Specialist Phone: [phone]

## 2021-01-28 NOTE — Progress Notes (Signed)
Mobility Specialist - Progress Note   01/28/21 1104  Mobility  Activity Ambulated in hall  Level of Assistance Minimal assist, patient does 75% or more  Assistive Device Front wheel walker  Distance Ambulated (ft) 90 ft  Mobility Ambulated with assistance in hallway  Mobility Response Tolerated well  Mobility performed by Mobility specialist  $Mobility charge 1 Mobility   Pt ambulated at a slow pace, requiring two brief standing rest breaks in order to catch his breath. Vitals were WNL throughout. Pt in recliner after walk, call bell at side and chair alarm on.   Pricilla Handler Mobility Specialist Mobility Specialist Phone: [phone]

## 2021-01-28 NOTE — Progress Notes (Signed)
Inpatient Diabetes Program Recommendations  AACE/ADA: New Consensus Statement on Inpatient Glycemic Control (2015)  Target Ranges:  Prepandial:   less than 140 mg/dL      Peak postprandial:   less than 180 mg/dL (1-2 hours)      Critically ill patients:  140 - 180 mg/dL   Lab Results  Component Value Date   GLUCAP 79 01/28/2021   HGBA1C 7.2 (H) 01/21/2021    Review of Glycemic Control Results for BREN, STEERS (MRN [id number]) as of 01/28/2021 10:29  Ref. Range 01/27/2021 06:00 01/27/2021 11:11 01/27/2021 15:42 01/27/2021 21:01 01/28/2021 06:43  Glucose-Capillary Latest Ref Range: 70 - 99 mg/dL 123 (H) 208 (H) 295 (H) 176 (H) 79   Diabetes history: DM 2 Outpatient Diabetes medications:  NPH 26 units q HS, Amaryl 2 mg daily, Humalog 12 units bid with lunch and dinner, Victoza 1.8 mg daily Current orders for Inpatient glycemic control:  Novolog moderate tid with meals Amaryl 2 mg daily NPH 26 units q HS Inpatient Diabetes Program Recommendations:    Consider splitting NPH dose 13 units bid.    Thanks  Adah Perl, RN, BC-ADM Inpatient Diabetes Coordinator Pager [phone] (8a-5p)

## 2021-01-28 NOTE — TOC Progression Note (Signed)
Transition of Care [facility]) - Progression Note    Patient Details  Name: Todd Romero MRN: [id number] Date of Birth: [birth year]  Transition of Care [facility]) CM/SW Contact  Loletha Grayer Beverely Pace, RN Phone Number: 01/28/2021, 1:42 PM  Clinical Narrative:  Case manager was notified that [facility] could not provide HHRN/PT for patient. Case manager spoke with [facility]. CM contacted patient's wife and updated here on need to get a [place]. Permission received to contact [facility], and to contact Carolynn Sayers, with [employer] to arrange for [facility] for IV Milrinone. Pam states that RN from [facility] will do the Milrinone. Referral for HHPT called to Adela Lank, [facility] Liaison.      Expected Discharge Plan: [place] Barriers to Discharge: Continued Medical Work up  Expected Discharge Plan and Services Expected Discharge Plan: [place] In-house Referral: Hospice / Palliative Care Discharge Planning Services: CM Consult Post Acute Care Choice: [place] arrangements for the past 2 months: Single Family Home                   DME Agency: NA       HH Arranged: PT,RN [place]: [place] Date [facility] Agency Contacted: 01/28/21 Time [place]: 32 Representative spoke with at [place]: Tommi Rumps   Social Determinants of Health (SDOH) Interventions Food Insecurity Interventions: Intervention Not Indicated Financial Strain Interventions: Intervention Not Indicated Housing Interventions: Intervention Not Indicated Transportation Interventions: Intervention Not Indicated  Readmission Risk Interventions Readmission Risk Prevention Plan 01/26/2021  Transportation Screening Complete  PCP or Specialist Appt within 3-5 Days Complete  HRI or Home Care Consult Complete  Social Work Consult for [place] Planning/Counseling Complete  Palliative Care Screening Complete  Medication Review Human resources officer) Complete  Some recent data might be hidden

## 2021-01-28 NOTE — Progress Notes (Addendum)
Patient ID: Todd Romero, male   DOB: Nov 25, 1933, 87 y.o.   MRN: [id number]     Advanced Heart Failure Rounding Note  PCP-Cardiologist: Sinclair Grooms, MD  [facility]: Dr. Aundra Dubin EP: Dr. 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  Recent admit 1/22 w/ a/c CHF in setting of persistent Afib, underwent DCCV, diuresed w/ IV lasix and required milrinone during admission.  Now readmitted w/ recurrent a/c CHF w/ marked fluid overload and NYHA Class IIIb symptoms, failing escalation of home diuretics.    Subjective:    Co-ox 65%, remains on milrinone 0.25 + lasix drip at 20/hr. Brisk diuresis noted.   Creatinine down 2.73 => 2.65=>2.8=>2.55=> 2.6   Feels ok. Wants to go home with milrinone.      Objective:   Weight Range: 93.1 kg Body mass index is 32.15 kg/m.   Vital Signs:   Temp:  [98.2 F (36.8 C)-98.9 F (37.2 C)] 98.3 F (36.8 C) (05/25 0736) Pulse Rate:  [69-73] 70 (05/25 0736) Resp:  [14-20] 16 (05/25 0736) BP: (95-112)/(58-65) 106/58 (05/25 0736) SpO2:  [96 %-99 %] 99 % (05/25 0736) Weight:  [93.1 kg] 93.1 kg (05/25 0357) Last BM Date: 01/26/21  Weight change: Filed Weights   01/26/21 0500 01/27/21 0500 01/28/21 0357  Weight: 98.7 kg 95.4 kg 93.1 kg    Intake/Output:   Intake/Output Summary (Last 24 hours) at 01/28/2021 0945 Last data filed at 01/28/2021 0856 Gross per 24 hour  Intake 500.95 ml  Output 3151 ml  Net -2650.05 ml      Physical Exam  CVP 11-12  General:  . No resp difficulty HEENT: normal Neck: supple. JVP 10-11. Carotids 2+ bilat; no bruits. No lymphadenopathy or thryomegaly appreciated. Cor: PMI nondisplaced. Regular rate & rhythm. No rubs, gallops or murmurs. Lungs: clear Abdomen: soft,  nontender, nondistended. No hepatosplenomegaly. No bruits or masses. Good bowel sounds. Extremities: no cyanosis, clubbing, rash, R and LLE unna boots.  RUE PICC  Neuro: alert & orientedx3, cranial nerves grossly intact. moves all 4 extremities w/o difficulty. Affect pleasant   Telemetry   V paced 70s   Labs    CBC Recent Labs    01/27/21 0400 01/28/21 0629  WBC 8.3 8.5  HGB 9.3* 8.9*  HCT 27.8* 28.1*  MCV 88.8 90.6  PLT 191 242   Basic Metabolic Panel Recent Labs    01/27/21 0400 01/27/21 1507 01/28/21 0629  NA 128* 127* 133*  K 2.9* 3.6 3.2*  CL 87* 86* 91*  CO2 31 29 30   GLUCOSE 250* 427* 78  BUN 80* 78* 81*  CREATININE 2.55* 2.64* 2.61*  CALCIUM 8.6* 8.3* 8.7*  MG 2.1  --   --    Liver Function Tests No results for input(s): AST, ALT, ALKPHOS, BILITOT, PROT, ALBUMIN in the last 72 hours. No results for input(s): LIPASE, AMYLASE in the last 72 hours. Cardiac Enzymes No results for input(s): CKTOTAL, CKMB, CKMBINDEX, TROPONINI in the last 72 hours.  BNP: BNP (last 3 results) Recent Labs    10/01/20 1710 01/21/21 1821  BNP 433.6* 1,245.4*    ProBNP (last 3 results) Recent Labs    01/30/20 1351 11/06/20 1218  PROBNP 1,776* 630.0*     D-Dimer No results for  input(s): DDIMER in the last 72 hours. Hemoglobin A1C No results for input(s): HGBA1C in the last 72 hours. Fasting Lipid Panel No results for input(s): CHOL, HDL, LDLCALC, TRIG, CHOLHDL, LDLDIRECT in the last 72 hours. Thyroid Function Tests No results for input(s): TSH, T4TOTAL, T3FREE, THYROIDAB in the last 72 hours.  Invalid input(s): FREET3  Other results:   Imaging    No results found.   Medications:     Scheduled Medications: . amiodarone  200 mg Oral Daily  . apixaban  2.5 mg Oral BID  . Chlorhexidine Gluconate Cloth  6 each Topical Daily  . citalopram  10 mg Oral Daily  . docusate sodium  100 mg Oral BID  . ferrous sulfate  325 mg Oral BID WC  . glimepiride  2 mg  Oral QAC breakfast  . insulin aspart  0-15 Units Subcutaneous TID WC  . insulin NPH Human  26 Units Subcutaneous QHS  . pantoprazole  40 mg Oral BID  . pravastatin  80 mg Oral q1800  . sodium chloride flush  10-40 mL Intracatheter Q12H  . sodium chloride flush  3 mL Intravenous Q12H  . tamsulosin  0.4 mg Oral QPC supper  . torsemide  100 mg Oral BID    Infusions: . sodium chloride    . milrinone 0.25 mcg/kg/min (01/28/21 0843)    PRN Medications: sodium chloride, acetaminophen, hydrOXYzine, polyethylene glycol, sodium chloride flush, sodium chloride flush, traZODone     Assessment/Plan   1. Acute on Chronic Systolic CHF: Suspect mixed ischemic/nonischemic cardiomyopathy.  Atrial fibrillation and excessive RV pacing may contribute to cardiomyopathy. Last cath in 2018 pre-TAVR with occluded D2 and 90% stenosis left PDA. He had MDT PPM for symptomatic bradycardia in 2/21, upgraded to CRT-P in 3/22 due to high percentage of RV pacing. TEE (2/22) with EF 25-30%, mildly decreased RV systolic function, normal function of TAVR valve.  PYP scan not suggestive of TTR cardiac amyloidosis.  He required milrinone during 2/22 admission.  CHF is complicated by CKD stage IV.  Continues to struggle w/ marked fluid overload and NYHA Class IIIb symptoms, failing recent escalation of home diuretics. Admitted for IV diuretics and milrinone to help w/ diuresis. He is on milrinone 0.25, co-ox 58%  today.   - Volume status improving. CVP down to 8. Continue current torsemide dose. .  - No ARNI, spironolactone with CKD stage IV.  - He did not tolerate Wilder Glade - c/w UNNA boots  - Avoiding beta blocker with marked volume overload and concern for low output HF requiring milrinone  - Not candidate for advanced therapies with age and CKD stage IV.  Seen by palliative care, wants to be full code for now. Patient/family request for [facility] Palliative services at home after discharge.  2. Atrial fibrillation: Paroxysmal.  TEE-DCCV on 10/07/20 initially converted him to NSR but back to atrial fibrillation soon after. Converted back to NSR on 10/08/20 with amiodarone.  ECG on 5/21 showed BiV pacing, possibly atrial fibrillation underlying.  - Continue amio 200 mg daily.  Recent LFTs/TSH normal, will need regular eye exam.  - Continue Eliquis 2.5 mg bid. There has been consideration for [place], but he is not falling frequently and does not have GI bleeding history.  Given CKD and uncontrolled CHF, would avoid the additional procedure.  3. CAD: Cath in 2018 pre-TAVR with occluded D2 and 90% stenosis left PDA, medically managed.  No chest pain.  - On Eliquis so no aspirin.  - Continue pravastatin, good lipids  in 4/22.  4. CKD Stage IV: baseline SCr 2-3 range, stable at 2.6 today.  - followed by outpatient nephrology.  5. Bradycardia --> PPM [employer] --> upgraded to CRT-P on 12/01/20 6. Anxiety: avoid xanax as he had SEs in the past.  - prn hydroxyzine  - Trazodone qhs prn 7. Hyponatremia: Hypovolemic. Sodium 127>128 >128 - 1500 cc po fluid restriction.  - Given Tolvaptan 5/22 .  8. Decreased Mobility  - PT following recommending HHPT. HH ordered 9. [place] -Palliative Care consulted for [place]. Patient/family request for [facility] Palliative services at home after discharge.   Placed tunneled PICC for home milrinone.  Set up [facility] for milrinone.   Length of Stay: [place], NP  01/28/2021, 9:45 AM  Advanced Heart Failure Team Pager [phone] (M-F; 7a - 5p)  Please contact [facility] for night-coverage after hours (5p -7a ) and weekends on [email or web address]  Patient seen with NP, agree with the above note.   CVP 10 today, creatinine stable at 2.61. Co-ox 58% on milrinone 0.25.   Weight down 5 lbs.  Now on torsemide 100 mg bid.   Walked down hall, breathing better.   General: NAD Neck: JVP 8-9 cm, no thyromegaly or thyroid nodule.  Lungs: Clear to auscultation bilaterally with normal respiratory effort. CV:  Nondisplaced PMI.  Heart regular S1/S2, no S3/S4, no murmur.  1+ ankle edema.  Abdomen: Soft, nontender, no hepatosplenomegaly, no distention.  Skin: Intact without lesions or rashes.  Neurologic: Alert and oriented x 3.  Psych: Normal affect. Extremities: No clubbing or cyanosis.  HEENT: Normal.   Weight now down about 20 lbs.  CVP down to 10.  - Continue torsemide 100 mg bid, will probably need metolazone once or twice a week at home.    He is doing better on milrinone.  Creatinine stable at 2.61 and co-ox 58%.  He was failing outpatient management on high dose diuretics (torsemide 100 mg bid + metolazone 2.5 tiw) due to lack of diuresis and progressive renal dysfunction. I think that if we take him off milrinone, he will have the same problem and be back in the hospital soon.  Extensive discussions so far with palliative care, he is not ready for hospice yet.  He and his wife understand that medical options are very limited at this point.  - I think it would be reasonable to try him on milrinone 0.25 at home, with the understanding that this is not life-prolonging but may keep him out of the hospital.  Will place tunneled PICC for the milrinone infusion and will try to get him home most likely tomorrow.   [name] 01/28/2021 10:06 AM

## 2021-01-28 NOTE — Progress Notes (Signed)
    Progress Note from the Palliative Medicine Team at [facility]   Patient Name: Todd Romero        Date: 01/28/2021 DOB: 1933-12-30  Age: 87 y.o. MRN#: [id number] Attending Physician: Larey Dresser, MD Primary Care Physician: Ma Hillock, DO Admit Date: 01/21/2021   Medical records reviewed   87 y.o. male with a medical history significant for chronic systolic CHF (EF 19-41%), severe aortic stenosis s/p TAVR (2018), symptomatic bradycardia, RBBB s/p permanent pacemaker (10/2019), coronary artery disease, atrial fibrillation, insulin-dependent diabetes, stage IV CKD, severe OSA, hypertension, and carpal tunnel s/p bilateral carpal tunnel release.   Patient directly admitted from home with complaints of weakness and shortness of breath, despite medication adjustements. Recently seen by heart failure clinic and the paramedicine team with adjustments made to his medications with a goal of improving dyspnea and lower extremity edema.    Patient currently being followed by heart failure.   Today is day 6 of this hospitalization, patient remains weak.  He is on a milrinone and Lasix drip.   Patient's ultimate goal is to discharge home before the weekend.  He has family coming into town  Patient and family face ongoing  treatment option decisions, advanced directive decisions and anticipatory care needs.    I met again today with  Todd Romero  and his wife/Todd Romero.   Patient is resting comfortably and  wife/Todd Romero is at the bedside  Both patient and his wife verbalize an understanding of the seriousness of the patient's current medical situation.  Under Dr. Claris Gladden guidance patient will be discharged home on a milrinone drip with the support of home health.  Both patient and his wife are hoping for continued quality of life.  Education offered on home hospice benefit vs home health with palliative.  DNR/DNI documented today   Education offered to  patient and his wife  the  importance of continued conversation with each other, family ( they have four children) and the medical providers regarding overall plan of care and treatment options,  ensuring decisions are within the context of the patients values and GOCs.  Recommend outpatient community-based palliative to follow on discharge  Questions and concerns addressed   Discussed with Todd Grinder NP  Total time spent on the unit was 35 minutes  PMT will follow up in the morning for continued clarification of goals of care.  Greater than 50% of the time was spent in counseling and coordination of care  Wadie Lessen NP  Palliative Medicine Team Team Phone # [phone] Pager [phone]

## 2021-01-28 NOTE — TOC Progression Note (Signed)
Transition of Care (TOC) - Progression Note  Heart Failure   Patient Details  Name: Todd Romero MRN: [id number] Date of Birth: Aug 02, 1934  Transition of Care [facility]) CM/SW [place], [place] Phone Number: 01/28/2021, 1:23 PM  Clinical Narrative:    CSW received a call from Jiles Harold [phone] at [facility] who reported they cannot provide home health services for this patient since he will be going home with IV milrinone as they don't have nursing for that service. CSW notified the [facility] regarding all of this and that another home health agency will need to be set up for the patient that can also provide nursing for the IV milrinone.     Expected Discharge Plan: [place] Barriers to Discharge: Continued Medical Work up  Expected Discharge Plan and Services Expected Discharge Plan: [place] In-house Referral: Hospice / Palliative Care Discharge Planning Services: CM Consult Post Acute Care Choice: [place] arrangements for the past 2 months: Single Family Home                   DME Agency: NA       HH Arranged: RN [place] Agency: [place] Date HH Agency Contacted: 01/26/21 Time [place] [address]: 1252 Representative spoke with at [place]: liberty rep   Social Determinants of Health (SDOH) Interventions Food Insecurity Interventions: Intervention Not Indicated Financial Strain Interventions: Intervention Not Indicated Housing Interventions: Intervention Not Indicated Transportation Interventions: Intervention Not Indicated  Readmission Risk Interventions Readmission Risk Prevention Plan 01/26/2021  Transportation Screening Complete  PCP or Specialist Appt within 3-5 Days Complete  HRI or [place] Complete  Social Work Consult for [place] Planning/Counseling Complete  Palliative Care Screening Complete  Medication Review Press photographer) Complete  Some  recent data might be hidden   [place], MSW, LCSWA [phone] Heart Failure Social Worker

## 2021-01-29 LAB — COOXEMETRY PANEL
Carboxyhemoglobin: 0.1 % — ABNORMAL LOW (ref 0.5–1.5)
Methemoglobin: 0.5 % (ref 0.0–1.5)
O2 Saturation: 62.7 %
Total hemoglobin: 9.3 g/dL — ABNORMAL LOW (ref 12.0–16.0)

## 2021-01-29 LAB — MAGNESIUM: Magnesium: 2.3 mg/dL (ref 1.7–2.4)

## 2021-01-29 LAB — CBC
HCT: 26.7 % — ABNORMAL LOW (ref 39.0–52.0)
Hemoglobin: 8.7 g/dL — ABNORMAL LOW (ref 13.0–17.0)
MCH: 29.3 pg (ref 26.0–34.0)
MCHC: 32.6 g/dL (ref 30.0–36.0)
MCV: 89.9 fL (ref 80.0–100.0)
Platelets: 199 10*3/uL (ref 150–400)
RBC: 2.97 MIL/uL — ABNORMAL LOW (ref 4.22–5.81)
RDW: 15.6 % — ABNORMAL HIGH (ref 11.5–15.5)
WBC: 9.4 10*3/uL (ref 4.0–10.5)
nRBC: 0 % (ref 0.0–0.2)

## 2021-01-29 LAB — BASIC METABOLIC PANEL
Anion gap: 9 (ref 5–15)
BUN: 80 mg/dL — ABNORMAL HIGH (ref 8–23)
CO2: 32 mmol/L (ref 22–32)
Calcium: 8.7 mg/dL — ABNORMAL LOW (ref 8.9–10.3)
Chloride: 92 mmol/L — ABNORMAL LOW (ref 98–111)
Creatinine, Ser: 2.55 mg/dL — ABNORMAL HIGH (ref 0.61–1.24)
GFR, Estimated: 24 mL/min — ABNORMAL LOW (ref >60)
Glucose, Bld: 133 mg/dL — ABNORMAL HIGH (ref 70–99)
Potassium: 3.3 mmol/L — ABNORMAL LOW (ref 3.5–5.1)
Sodium: 133 mmol/L — ABNORMAL LOW (ref 135–145)

## 2021-01-29 LAB — GLUCOSE, CAPILLARY
Glucose-Capillary: 133 mg/dL — ABNORMAL HIGH (ref 70–99)
Glucose-Capillary: 148 mg/dL — ABNORMAL HIGH (ref 70–99)

## 2021-01-29 MED ORDER — CITALOPRAM HYDROBROMIDE 10 MG PO TABS: 10.0000 mg | ORAL_TABLET | Freq: Every day | ORAL | 3 refills | Status: DC

## 2021-01-29 MED ORDER — POTASSIUM CHLORIDE CRYS ER 20 MEQ PO TBCR
40.0000 meq | EXTENDED_RELEASE_TABLET | ORAL | Status: DC
  Administered 2021-01-29 (×2): 40 meq via ORAL
  Filled 2021-01-29 (×2): qty 2

## 2021-01-29 MED ORDER — MILRINONE LACTATE IN DEXTROSE 20-5 MG/100ML-% IV SOLN: 0.2500 ug/kg/min | INTRAVENOUS | Status: AC

## 2021-01-29 MED ORDER — METOLAZONE 2.5 MG PO TABS: 2.5000 mg | ORAL_TABLET | ORAL | 3 refills | Status: AC | PRN

## 2021-01-29 MED ORDER — POTASSIUM CHLORIDE CRYS ER 20 MEQ PO TBCR: 40.0000 meq | EXTENDED_RELEASE_TABLET | Freq: Every day | ORAL | 3 refills | Status: AC

## 2021-01-29 NOTE — Progress Notes (Signed)
Patient ID: Todd Romero, male   DOB: 06-Oct-1933, 87 y.o.   MRN: [id number]     Advanced Heart Failure Rounding Note  PCP-Cardiologist: Sinclair Grooms, MD  [facility]: Dr. Aundra Dubin EP: Dr. 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  Recent admit 1/22 w/ a/c CHF in setting of persistent Afib, underwent DCCV, diuresed w/ IV lasix and required milrinone during admission.  Now readmitted w/ recurrent a/c CHF w/ marked fluid overload and NYHA Class IIIb symptoms, failing escalation of home diuretics.    Subjective:    Co-ox 63%, remains on milrinone 0.25, now on torsemide 100 mg bid.  CVP 9 today.   Creatinine down 2.73 => 2.65=>2.8=>2.55=> 2.6 => 2.55.   Feels ok. Wants to go home with milrinone. Walked down hall yesterday.      Objective:   Weight Range: 91.8 kg Body mass index is 31.7 kg/m.   Vital Signs:   Temp:  [98.2 F (36.8 C)-99.3 F (37.4 C)] 99.3 F (37.4 C) (05/26 0736) Pulse Rate:  [69-72] 69 (05/26 0736) Resp:  [14-23] 14 (05/26 0736) BP: (88-126)/(55-71) 88/55 (05/26 0736) SpO2:  [95 %-98 %] 96 % (05/26 0736) Weight:  [91.8 kg] 91.8 kg (05/26 0550) Last BM Date: 01/29/21  Weight change: Filed Weights   01/27/21 0500 01/28/21 0357 01/29/21 0550  Weight: 95.4 kg 93.1 kg 91.8 kg    Intake/Output:   Intake/Output Summary (Last 24 hours) at 01/29/2021 0816 Last data filed at 01/29/2021 0600 Gross per 24 hour  Intake 417.25 ml  Output 1600 ml  Net -1182.75 ml      Physical Exam  CVP 9 General: NAD Neck: JVP 8 cm, no thyromegaly or thyroid nodule.  Lungs: Clear to auscultation bilaterally with normal respiratory effort. CV: Nondisplaced PMI.  Heart regular S1/S2, no S3/S4, no murmur.  Trace ankle  edema. Abdomen: Soft, nontender, no hepatosplenomegaly, no distention.  Skin: Intact without lesions or rashes.  Neurologic: Alert and oriented x 3.  Psych: Normal affect. Extremities: No clubbing or cyanosis.  HEENT: Normal.    Telemetry   V paced 70s (personally reviewed)  Labs    CBC Recent Labs    01/28/21 0629 01/29/21 0448  WBC 8.5 9.4  HGB 8.9* 8.7*  HCT 28.1* 26.7*  MCV 90.6 89.9  PLT 199 026   Basic Metabolic Panel Recent Labs    01/27/21 0400 01/27/21 1507 01/28/21 0629 01/29/21 0448  NA 128*   < > 133* 133*  K 2.9*   < > 3.2* 3.3*  CL 87*   < > 91* 92*  CO2 31   < > 30 32  GLUCOSE 250*   < > 78 133*  BUN 80*   < > 81* 80*  CREATININE 2.55*   < > 2.61* 2.55*  CALCIUM 8.6*   < > 8.7* 8.7*  MG 2.1  --   --  2.3   < > = values in this interval not displayed.   Liver Function Tests No results for input(s): AST, ALT, ALKPHOS, BILITOT, PROT, ALBUMIN in the last 72 hours. No results for input(s): LIPASE, AMYLASE in the last 72 hours. Cardiac Enzymes No results for input(s): CKTOTAL, CKMB, CKMBINDEX, TROPONINI in the last 72 hours.  BNP:  BNP (last 3 results) Recent Labs    10/01/20 1710 01/21/21 1821  BNP 433.6* 1,245.4*    ProBNP (last 3 results) Recent Labs    01/30/20 1351 11/06/20 1218  PROBNP 1,776* 630.0*     D-Dimer No results for input(s): DDIMER in the last 72 hours. Hemoglobin A1C No results for input(s): HGBA1C in the last 72 hours. Fasting Lipid Panel No results for input(s): CHOL, HDL, LDLCALC, TRIG, CHOLHDL, LDLDIRECT in the last 72 hours. Thyroid Function Tests No results for input(s): TSH, T4TOTAL, T3FREE, THYROIDAB in the last 72 hours.  Invalid input(s): FREET3  Other results:   Imaging    IR Fluoro Guide CV Line Right  Result Date: 01/28/2021 INDICATION: 87 year old male referred for central venous access EXAM: IMAGE GUIDED TUNNELED CENTRAL VENOUS CATHETER MEDICATIONS: None ANESTHESIA/SEDATION: None  FLUOROSCOPY TIME:  Fluoroscopy Time: 0 minutes 6 seconds (6 mGy). COMPLICATIONS: None PROCEDURE: Informed written consent was obtained from the patient after a thorough discussion of the procedural risks, benefits and alternatives. All questions were addressed. Maximal Sterile Barrier Technique was utilized including caps, mask, sterile gowns, sterile gloves, sterile drape, hand hygiene and skin antiseptic. A timeout was performed prior to the initiation of the procedure. After written informed consent was obtained, patient was placed in the supine position on angiographic table. Patency of the right internal jugular vein was confirmed with ultrasound with image documentation. Patient was prepped and draped in the usual sterile fashion including the right neck and right superior chest. Using ultrasound guidance, the skin and subcutaneous tissues overlying the right internal jugular vein were generously infiltrated with 1% lidocaine without epinephrine. Using ultrasound guidance, the right internal jugular vein was punctured with a micropuncture needle, and an 018 wire was advanced into the right heart confirming venous access. A small stab incision was made with an 11 blade scalpel. Peel-away sheath was placed over the wire, and then the wire was removed, marking the wire for estimation of internal catheter length. The chest wall was then generously infiltrated with 1% lidocaine for local anesthesia along the tissue tract. Small stab incision was made with 11 blade scalpel, and then the catheter was back tunneled to the puncture site at the right internal jugular vein. Catheter was pulled through the tract, with the catheter amputated at 23 cm. Catheter was advanced through the peel-away sheath, and the peel-away sheath was removed. Final image was stored. The catheter was anchored to the chest wall with 2 retention sutures, and Derma bond was used to seal the right internal jugular vein incision site and at the  right chest wall. Patient tolerated the procedure well and remained hemodynamically stable throughout. No complications were encountered and no significant blood loss was encountered. IMPRESSION: Status post image guided tunneled right IJ central venous catheter. Signed, Dulcy Fanny. Dellia Nims, RPVI [facility] Electronically Signed   By: Corrie Mckusick D.O.   On: 01/28/2021 16:08   IR US Guide Vasc Access Right  Result Date: 01/28/2021 INDICATION: 87 year old male referred for central venous access EXAM: IMAGE GUIDED TUNNELED CENTRAL VENOUS CATHETER MEDICATIONS: None ANESTHESIA/SEDATION: None FLUOROSCOPY TIME:  Fluoroscopy Time: 0 minutes 6 seconds (6 mGy). COMPLICATIONS: None PROCEDURE: Informed written consent was obtained from the patient after a thorough discussion of the procedural risks, benefits and alternatives. All questions were addressed. Maximal Sterile Barrier Technique was utilized including caps, mask, sterile gowns, sterile gloves, sterile drape, hand hygiene and skin antiseptic. A timeout was performed prior to the initiation  of the procedure. After written informed consent was obtained, patient was placed in the supine position on angiographic table. Patency of the right internal jugular vein was confirmed with ultrasound with image documentation. Patient was prepped and draped in the usual sterile fashion including the right neck and right superior chest. Using ultrasound guidance, the skin and subcutaneous tissues overlying the right internal jugular vein were generously infiltrated with 1% lidocaine without epinephrine. Using ultrasound guidance, the right internal jugular vein was punctured with a micropuncture needle, and an 018 wire was advanced into the right heart confirming venous access. A small stab incision was made with an 11 blade scalpel. Peel-away sheath was placed over the wire, and then the wire was removed, marking the  wire for estimation of internal catheter length. The chest wall was then generously infiltrated with 1% lidocaine for local anesthesia along the tissue tract. Small stab incision was made with 11 blade scalpel, and then the catheter was back tunneled to the puncture site at the right internal jugular vein. Catheter was pulled through the tract, with the catheter amputated at 23 cm. Catheter was advanced through the peel-away sheath, and the peel-away sheath was removed. Final image was stored. The catheter was anchored to the chest wall with 2 retention sutures, and Derma bond was used to seal the right internal jugular vein incision site and at the right chest wall. Patient tolerated the procedure well and remained hemodynamically stable throughout. No complications were encountered and no significant blood loss was encountered. IMPRESSION: Status post image guided tunneled right IJ central venous catheter. Signed, Dulcy Fanny. Dellia Nims, RPVI [facility] Electronically Signed   By: Corrie Mckusick D.O.   On: 01/28/2021 16:08     Medications:     Scheduled Medications: . amiodarone  200 mg Oral Daily  . apixaban  2.5 mg Oral BID  . Chlorhexidine Gluconate Cloth  6 each Topical Daily  . citalopram  10 mg Oral Daily  . docusate sodium  100 mg Oral BID  . ferrous sulfate  325 mg Oral BID WC  . glimepiride  2 mg Oral QAC breakfast  . insulin aspart  0-15 Units Subcutaneous TID WC  . insulin NPH Human  13 Units Subcutaneous BID AC & HS  . pantoprazole  40 mg Oral BID  . potassium chloride  40 mEq Oral Q4H  . pravastatin  80 mg Oral q1800  . sodium chloride flush  10-40 mL Intracatheter Q12H  . sodium chloride flush  3 mL Intravenous Q12H  . tamsulosin  0.4 mg Oral QPC supper  . torsemide  100 mg Oral BID    Infusions: . sodium chloride    . milrinone 0.25 mcg/kg/min (01/29/21 0600)    PRN Medications: sodium chloride, acetaminophen,  hydrOXYzine, lidocaine (PF), polyethylene glycol, sodium chloride flush, sodium chloride flush, traZODone     Assessment/Plan   1. Acute on Chronic Systolic CHF: Suspect mixed ischemic/nonischemic cardiomyopathy.  Atrial fibrillation and excessive RV pacing may contribute to cardiomyopathy. Last cath in 2018 pre-TAVR with occluded D2 and 90% stenosis left PDA. He had MDT PPM for symptomatic bradycardia in 2/21, upgraded to CRT-P in 3/22 due to high percentage of RV pacing. TEE (2/22) with EF 25-30%, mildly decreased RV systolic function, normal function of TAVR valve.  PYP scan not suggestive of TTR cardiac amyloidosis.  He required milrinone during 2/22 admission.  CHF is complicated by CKD stage IV.  Continues to struggle w/ marked fluid  overload and NYHA Class IV symptoms, failing recent escalation of home diuretics. Admitted for IV diuretics and milrinone to help w/ diuresis. He is on milrinone 0.25, co-ox 60%  today.  Much improved with weight down > 20 lbs and CVP 9 today.  - Continue torsemide 100 mg bid.  - No ARNI, spironolactone with CKD stage IV.  - He did not tolerate [place] - c/w UNNA boots  - Avoiding beta blocker with marked volume overload and low output HF requiring milrinone  - Admitted with NYHA class IV symptoms, now doing better on milrinone. He was failing outpatient management on high dose diuretics (torsemide 100 mg bid + metolazone 2.5 tiw) due to lack of diuresis and progressive renal dysfunction. I think that if we take him off milrinone, he will have the same problem and be back in the hospital soon.  Extensive discussions so far with palliative care, he is not ready for hospice yet.  He and his wife understand that medical options are very limited at this point => not candidate for advanced therapies with age and renal dysfunction. I think it is reasonable to try him on milrinone 0.25 at home, with the understanding that this is not life-prolonging but may keep him out of  the hospital.  He has tunneled PICC.  2. Atrial fibrillation: Paroxysmal. TEE-DCCV on 10/07/20 initially converted him to NSR but back to atrial fibrillation soon after. Converted back to NSR on 10/08/20 with amiodarone.  ECG on 5/21 showed BiV pacing, possibly atrial fibrillation underlying.  - Continue amio 200 mg daily.  Recent LFTs/TSH normal, will need regular eye exam.  - Continue Eliquis 2.5 mg bid. There has been consideration for [place], but he is not falling frequently and does not have GI bleeding history.  Given CKD and uncontrolled CHF, would avoid the additional procedure.  3. CAD: Cath in 2018 pre-TAVR with occluded D2 and 90% stenosis left PDA, medically managed.  No chest pain.  - On Eliquis so no aspirin.  - Continue pravastatin, good lipids in 4/22.  4. CKD Stage IV: baseline SCr 2-3 range, stable at 2.6 today.  - followed by outpatient nephrology.  5. Bradycardia --> PPM [employer] --> upgraded to CRT-P on 12/01/20 6. Anxiety: avoid xanax as he had SEs in the past.  - prn hydroxyzine  - Trazodone qhs prn 7. Hyponatremia: Hypervolemic. Sodium up to 133.  - 1500 cc po fluid restriction.  - Given Tolvaptan 5/22.  8. Decreased Mobility: PT following recommending HHPT. HH ordered 9. GOC: Palliative Care consulted for [place]. Patient/family request for [facility] Palliative services at home after discharge.   Home today.  Close followup in CHF clinic (10 days).  Needs to be followed paramedicine, home PT, home health, and palliative care.  Cardiac meds for home: milrinone 0.25, torsemide 100 mg bid, apixaban 2.5 bid, amiodarone 200 daily,  Pravastatin 80 daily, KCl 40 daily.  Will assess for metolazone needs, will have for prn use but not standing use for now.   Length of Stay: [place], MD  01/29/2021, 8:16 AM  Advanced Heart Failure Team Pager [phone] (M-F; 7a - 5p)  Please contact [facility] for night-coverage after hours (5p -7a ) and weekends on [email or web address]

## 2021-01-29 NOTE — Discharge Instructions (Signed)
Heart Failure Eating Plan Heart failure, also called congestive heart failure, occurs when your heart does not pump blood well enough to meet your body's needs for oxygen-rich blood. Heart failure is a long-term (chronic) condition. Living with heart failure can be challenging. Following your health care provider's instructions about a healthy lifestyle and working with a dietitian to choose the right foods may help to improve your symptoms. An eating plan for someone with heart failure will include changes that limit the intake of salt (sodium) and unhealthy fat. What are tips for following this plan? Reading food labels  Check food labels for the amount of sodium per serving. Choose foods that have less than 140 mg (milligrams) of sodium in each serving.  Check food labels for the number of calories per serving. This is important if you need to limit your daily calorie intake to lose weight.  Check food labels for the serving size. If you eat more than one serving, you will be eating more sodium and calories than what is listed on the label.  Look for foods that are labeled as "sodium-free," "very low sodium," or "low sodium." ? Foods labeled as "reduced sodium" or "lightly salted" may still have more sodium than what is recommended for you. Cooking  Avoid adding salt when cooking. Ask your health care provider or dietitian before using salt substitutes.  Season food with salt-free seasonings, spices, or herbs. Check the label of seasoning mixes to make sure they do not contain salt.  Cook with heart-healthy oils, such as olive, canola, soybean, or sunflower oil.  Do not fry foods. Cook foods using low-fat methods, such as baking, boiling, grilling, and broiling.  Limit unhealthy fats when cooking by: ? Removing the skin from poultry, such as chicken. ? Removing all visible fats from meats. ? Skimming the fat off from stews, soups, and gravies before serving them. Meal planning  Limit  your intake of: ? Processed, canned, or prepackaged foods. ? Foods that are high in trans fat, such as fried foods. ? Sweets, desserts, sugary drinks, and other foods with added sugar. ? Full-fat dairy products, such as whole milk.  Eat a balanced diet. This may include: ? 4-5 servings of fruit each day and 4-5 servings of vegetables each day. At each meal, try to fill one-half of your plate with fruits and vegetables. ? Up to 6-8 servings of whole grains each day. ? Up to 2 servings of lean meat, poultry, or fish each day. One serving of meat is equal to 3 oz (85 g). This is about the same size as a deck of cards. ? 2 servings of low-fat dairy each day. ? Heart-healthy fats. Healthy fats called omega-3 fatty acids are found in foods such as flaxseed and cold-water fish like sardines, salmon, and mackerel.  Aim to eat 25-35 g (grams) of fiber a day. Foods that are high in fiber include apples, broccoli, carrots, beans, peas, and whole grains.  Do not add salt or condiments that contain salt (such as soy sauce) to foods before eating.  When eating at a restaurant, ask that your food be prepared with less salt or no salt, if possible.  Try to eat 2 or more vegetarian meals each week.  Eat more home-cooked food and eat less restaurant, buffet, and fast food.   General information  Do not eat more than 2,300 mg of sodium a day. The amount of sodium that is recommended for you may be lower, depending on your   condition.  Maintain a healthy body weight as directed. Ask your health care provider what a healthy weight is for you. ? Check your weight every day. ? Work with your health care provider and dietitian to make a plan that is right for you to lose weight or maintain your current weight.  Limit how much fluid you drink. Ask your health care provider or dietitian how much fluid you can have each day.  Limit or avoid alcohol as told by your health care provider or dietitian. Recommended  foods Fruits All fresh, frozen, and canned fruits. Dried fruits, such as raisins, prunes, and cranberries. Vegetables All fresh vegetables. Vegetables that are frozen without sauce or added salt. Low-sodium or sodium-free canned vegetables. Grains Bread with less than 80 mg of sodium per slice. Whole-wheat pasta, quinoa, and brown rice. Oats and oatmeal. Barley. Millet. Grits and cream of wheat. Whole-grain and whole-wheat cold cereal. Meats and other protein foods Lean cuts of meat. Skinless chicken and turkey. Fish with high omega-3 fatty acids, such as salmon, sardines, and other cold-water fishes. Eggs. Dried beans, peas, and edamame. Unsalted nuts and nut butters. Dairy Low-fat or nonfat (skim) milk and dried milk. Rice milk, soy milk, and almond milk. Low-fat or nonfat yogurt. Small amounts of reduced-sodium block cheese. Low-sodium cottage cheese. Fats and oils Olive, canola, soybean, flaxseed, avocado, or sunflower oil. Sweets and desserts Applesauce. Granola bars. Sugar-free pudding and gelatin. Frozen fruit bars. Seasoning and other foods Fresh and dried herbs. Lemon or lime juice. Vinegar. Low-sodium ketchup. Salt-free marinades, salad dressings, sauces, and seasonings. The items listed above may not be a complete list of foods and beverages you can eat. Contact a dietitian for more information. Foods to avoid Fruits Fruits that are dried with sodium-containing preservatives. Vegetables Canned vegetables. Frozen vegetables with sauce or seasonings. Creamed vegetables. French fries. Onion rings. Pickled vegetables and sauerkraut. Grains Bread with more than 80 mg of sodium per slice. Hot or cold cereal with more than 140 mg sodium per serving. Salted pretzels and crackers. Prepackaged breadcrumbs. Bagels, croissants, and biscuits. Meats and other protein foods Ribs and chicken wings. Bacon, ham, pepperoni, bologna, salami, and packaged luncheon meats. Hot dogs, bratwurst, and  sausage. Canned meat. Smoked meat and fish. Salted nuts and seeds. Dairy Whole milk, half-and-half, and cream. Buttermilk. Processed cheese, cheese spreads, and cheese curds. Regular cottage cheese. Feta cheese. Shredded cheese. String cheese. Fats and oils Butter, lard, shortening, ghee, and bacon fat. Canned and packaged gravies. Seasoning and other foods Onion salt, garlic salt, table salt, and sea salt. Marinades. Regular salad dressings. Relishes, pickles, and olives. Meat flavorings and tenderizers, and bouillon cubes. Horseradish, ketchup, and mustard. Worcestershire sauce. Teriyaki sauce, soy sauce (including reduced sodium). Hot sauce and Tabasco sauce. Steak sauce, fish sauce, oyster sauce, and cocktail sauce. Taco seasonings. Barbecue sauce. Tartar sauce. The items listed above may not be a complete list of foods and beverages you should avoid. Contact a dietitian for more information. Summary  A heart failure eating plan includes changes that limit your intake of sodium and unhealthy fat, and it may help you lose weight or maintain a healthy weight. Your health care provider may also recommend limiting how much fluid you drink.  Most people with heart failure should eat no more than 2,300 mg of salt (sodium) a day. The amount of sodium that is recommended for you may be lower, depending on your condition.  Contact your health care provider or dietitian before making any major   changes to your diet. This information is not intended to replace advice given to you by your health care provider. Make sure you discuss any questions you have with your health care provider. Document Revised: 04/07/2020 Document Reviewed: 04/07/2020 Elsevier Patient Education  2021 Elsevier Inc.  

## 2021-01-29 NOTE — Plan of Care (Signed)

## 2021-01-29 NOTE — Care Management Important Message (Signed)
Important Message  Patient Details  Name: Todd Romero MRN: [id number] Date of Birth: [birth year]   Medicare Important Message Given:  Yes     [name] 01/29/2021, 3:10 PM

## 2021-01-29 NOTE — TOC Transition Note (Addendum)
Transition of Care [facility]) - CM/SW Discharge Note Heart Failure   Patient Details  Name: Todd Romero MRN: [id number] Date of Birth: 12-25-1933  Transition of [facility]) CM/SW Contact:  [place], [place] Phone Number: 01/29/2021, 12:31 PM   Clinical Narrative:    CSW spoke with patient and his wife at bedside to bring them an appointment card for the [facility] outpatient clinic and encouraged them to follow up and to attend the appointment and bring the patients medications and if anything changes to please reach out so that CSW/HV clinic team can provide support. Hospital follow up scheduled for 02/04/2021 at 11:30 with Dr. Raoul Pitch. The patients wife, Todd Romero reported she will provide transportation home for her husband. CSW encouraged the patient and his wife to reach out for support as needed if anything arises between now and patients discharge home.     CSW will sign off for now as social work intervention is no longer needed. Please consult [place] again if new needs arise.   Final next level of care: [place] Barriers to Discharge: No Barriers Identified   Patient Goals and CMS Choice Patient states their goals for this hospitalization and ongoing recovery are:: return home CMS Medicare.gov Compare Post Acute Care list provided to:: Patient Represenative (must comment) (patients spouse Todd Romero) Choice offered to / list presented to : Spouse  Discharge Placement                       Discharge Plan and Services In-house Referral: Hospice / Palliative Care Discharge Planning Services: CM Consult Post Acute Care Choice: Home Health            DME Agency: NA       HH Arranged: PT,RN [place] Agency: [place] Date [facility] Agency Contacted: 01/28/21 Time [place]: [id number] Representative spoke with at [place]: [place] ([place]) Interventions Food Insecurity Interventions: Intervention Not Indicated Financial Strain  Interventions: Intervention Not Indicated Housing Interventions: Intervention Not Indicated Transportation Interventions: Intervention Not Indicated   Readmission Risk Interventions Readmission Risk Prevention Plan 01/26/2021  Transportation Screening Complete  PCP or Specialist Appt within 3-5 Days Complete  HRI or [place] Complete  Social Work Consult for [place] Planning/Counseling Complete  Palliative Care Screening Complete  Medication Review Press photographer) Complete  Some recent data might be hidden     [place], MSW, LCSWA [phone] Heart Failure Social Worker

## 2021-01-29 NOTE — Discharge Summary (Signed)
Advanced Heart Failure Team  Discharge Summary   Patient ID: RUE TINNEL MRN: [id number], DOB/AGE: Jan 07, 1934 87 y.o. Admit date: 01/21/2021 D/C date:     01/29/2021   Primary Discharge Diagnoses:  Acute on Chronic Systolic Heart Failure, Mixed Ischemic/ Nonischemic CM. Inotrope dependent   Secondary Discharge Diagnoses:  Stage IV CKD PAF Bradycardia s/p PPM  CAD Anxiety  Deconditioning/ Decreased Mobility   Hospital Course:   Mr Lesser is a 87 y.o. with a history of chronic systolic CHF, echo 4/94 showed reduced EF 35-40% with moderate LVH, RV normal. Also history of severe aortic stenosis status post TAVR in2018, symptomatic bradycardia/RBBBstatus post Medtronic permanent pacemaker implanted 10/2019, CAD, PAF, insulin-dependent diabetes, stage IVCKD, severe OSAintolerant toCPAP, hypertension, as well as history of carpal tunnel status post bilateralcarpal tunnel release.  Presented to the ED on 10/01/20. Markedly fluid overloaded.Started on IV lasix + empiric milrinone. His device was interrogated and showed he had been in persistent atrial fibrillation since 09/06/20 w/ 34% RV pacing. Underwent TEE guided DCCV back to NSR. TEE (10/07/20) with EF 25-30%, mildly decreased RV systolic function, normal function of TAVR valve. Hospital course complicated by AKI/urosepsis. Work up for amyloid was negative. Myeloma panel, urine IFXN, and PYP were negative.Will need followup with EP =>discussed with Dr. Quentin Ore, suspect he needed CRT given high percentage (>30%) RV pacing and a-pacing with very long PR interval. Discharged to SNF, [facility]. Discharge weight was 223 pounds.   He was sent to [facility] 10/19/20 for anxiety/tremors. CT of head was negative for hemorrhage. He was sent back to SNF.  Creatinine 1.8  And K 5.1.   On 11/21/20 he was referred to [facility] for volume overload. Overall he received 6 doses of IV lasix over the 4 days.   He was seen in  the clinic 3/22 and he remained volume overloaded. [facility] was instructed to give  80 mg IV lasix x1 + 3/23 &3/24 5 mg metolazone once + 80 mg IV twice daily + 20 meq potassium.  On 12/01/20, he had upgrade to MDT CRT-P device.   Had return [facility] f/u on 5/10.  He continued to struggle with the combination of CHF and CKD, feeling worse overall and weaker.  Using walker around the house due to "weakness."  BP was ok. He was short of breath walking about 100 feet.  Still with significant peripheral edema. Denied chest pain, no palpitations, no falls. He was fluid overloaded on exam and diuretic regimen titrated. Torsemide increased to 100 mg bid. Metolazone increased to 2.5 mg three times/week (MWF). KCl increased as well. He was also referred to paramedicine for monitoring and potential need for intermittent IV Lasix at home. Despite these measures, he failed to improve w/ increasing LE edema, persistent weakness and worsening exertional dyspnea. He was directly admitted on 4/96 for a/c systolic HF and treatment w/ IV diuretics.  On admit, PICC line was placed, co-ox marginal, required milrinone and lasix gtt for diuresis. He diuresed > 20 lb and transitioned back to PO diuretics but unable to wean off of milrinone. Palliative care was consulted for [place] discussion. Patient/family request for [facility] Palliative services at home after discharge. He will go home w/ home milrinone, 0.25 mgc/kg/min. He will be followed by paramedicine, home PT, home health, and palliative care. Close f/u in [place] arranged.    See Detailed Hospital Problem List Below   1. Acute on Chronic Systolic CHF: Suspect mixed ischemic/nonischemic cardiomyopathy. Atrial fibrillation and  excessive RV pacing may contribute to cardiomyopathy. Last cath in 2018 pre-TAVR with occluded D2 and 90% stenosis left PDA. He had MDT PPM for symptomatic bradycardia in 2/21, upgraded to CRT-P in 3/22 due to high percentage of RV pacing. TEE (2/22) with  EF 25-30%, mildly decreased RV systolic function, normal function of TAVR valve. PYP scan not suggestive of TTR cardiac amyloidosis. He required milrinone during 2/22 admission. CHF is complicated by CKD stage IV. Continues to struggle w/ marked fluid overload and NYHA Class IV symptoms, failing recent escalation of home diuretics. Admitted for IV diuretics and milrinone to help w/ diuresis. He is on milrinone 0.25, co-ox 60%  today.  Much improved with weight down > 20 lbs and CVP 9 today.  - Continue torsemide 100 mg bid.  - No ARNI, spironolactone with CKD stage IV.  - He did not tolerate Wilder Glade - c/w UNNA boots  - Avoiding beta blocker with marked volume overload andlow output HF requiring milrinone  - Admitted with NYHA class IV symptoms, now doing better on milrinone.He was failing outpatient management on high dose diuretics (torsemide 100 mg bid + metolazone 2.5 tiw) due to lack of diuresis and progressive renal dysfunction. I think that if we take him off milrinone, he will have the same problem and be back in the hospital soon. Extensive discussions so far with palliative care, he is not ready for hospice yet. He and his wife understand that medical options are very limited at this point => not candidate for advanced therapies with age and renal dysfunction. I think it is reasonable to try him on milrinone 0.25 at home, with the understanding that this is not life-prolonging but may keep him out of the hospital. He has tunneled PICC.  2. Atrial fibrillation: Paroxysmal. TEE-DCCV on 10/07/20 initially converted him to NSR but back to atrial fibrillation soon after. Converted back to NSR on 10/08/20 with amiodarone.  ECG on 5/21 showed BiV pacing, possibly atrial fibrillation underlying.  - Continue amio 200 mg daily. Recent LFTs/TSH normal, will need regular eye exam.  - Continue Eliquis 2.5 mg bid. There has been consideration for [place], but he is not falling frequently and does not have  GI bleeding history. Given CKD and uncontrolled CHF, would avoid the additional procedure.  3. CAD: Cath in 2018 pre-TAVR with occluded D2 and 90% stenosis left PDA, medically managed. No chest pain.  - On Eliquis so no aspirin.  - Continue pravastatin, good lipids in 4/22.  4. CKD Stage IV: baseline SCr 2-3 range, stable at 2.6 today.  -followed by outpatientnephrology.  5. Bradycardia --> PPM Medtronic --> upgraded to CRT-P on 12/01/20 6. Anxiety: avoid xanax as he had SEs in the past.  - prn hydroxyzine  - Trazodone qhs prn 7. Hyponatremia: Hypervolemic. Sodium up to 133.  - 1500 cc po fluid restriction.  - Given Tolvaptan 5/22.  8. Decreased Mobility: PT following recommending HHPT. HH ordered 9. GOC: Palliative Care consulted for [place]. Patient/family request for [facility] Palliative services at home after discharge.   Home today.  Close followup in CHF clinic (10 days).  Needs to be followed paramedicine, home PT, home health, and palliative care.  Cardiac meds for home: milrinone 0.25, torsemide 100 mg bid, apixaban 2.5 bid, amiodarone 200 daily,  Pravastatin 80 daily, KCl 40 daily.  Will assess for metolazone needs, will have for prn use but not standing use for now.     Discharge Weight Range: 202 lb  Discharge Vitals:  Blood pressure (!) 88/55, pulse 69, temperature 99.3 F (37.4 C), temperature source Oral, resp. rate 14, height 5\' 7"  (1.702 m), weight 91.8 kg, SpO2 96 %.  Labs: Lab Results  Component Value Date   WBC 9.4 01/29/2021   HGB 8.7 (L) 01/29/2021   HCT 26.7 (L) 01/29/2021   MCV 89.9 01/29/2021   PLT 199 01/29/2021    Recent Labs  Lab 01/29/21 0448  NA 133*  K 3.3*  CL 92*  CO2 32  BUN 80*  CREATININE 2.55*  CALCIUM 8.7*  GLUCOSE 133*   Lab Results  Component Value Date   CHOL 118 12/23/2020   HDL 57 12/23/2020   LDLCALC 52 12/23/2020   TRIG 47 12/23/2020   BNP (last 3 results) Recent Labs    10/01/20 1710 01/21/21 1821  BNP 433.6* 1,245.4*     ProBNP (last 3 results) Recent Labs    01/30/20 1351 11/06/20 1218  PROBNP 1,776* 630.0*     Diagnostic Studies/Procedures   IR Fluoro Guide CV Line Right  Result Date: 01/28/2021 INDICATION: 87 year old male referred for central venous access EXAM: IMAGE GUIDED TUNNELED CENTRAL VENOUS CATHETER MEDICATIONS: None ANESTHESIA/SEDATION: None FLUOROSCOPY TIME:  Fluoroscopy Time: 0 minutes 6 seconds (6 mGy). COMPLICATIONS: None PROCEDURE: Informed written consent was obtained from the patient after a thorough discussion of the procedural risks, benefits and alternatives. All questions were addressed. Maximal Sterile Barrier Technique was utilized including caps, mask, sterile gowns, sterile gloves, sterile drape, hand hygiene and skin antiseptic. A timeout was performed prior to the initiation of the procedure. After written informed consent was obtained, patient was placed in the supine position on angiographic table. Patency of the right internal jugular vein was confirmed with ultrasound with image documentation. Patient was prepped and draped in the usual sterile fashion including the right neck and right superior chest. Using ultrasound guidance, the skin and subcutaneous tissues overlying the right internal jugular vein were generously infiltrated with 1% lidocaine without epinephrine. Using ultrasound guidance, the right internal jugular vein was punctured with a micropuncture needle, and an 018 wire was advanced into the right heart confirming venous access. A small stab incision was made with an 11 blade scalpel. Peel-away sheath was placed over the wire, and then the wire was removed, marking the wire for estimation of internal catheter length. The chest wall was then generously infiltrated with 1% lidocaine for local anesthesia along the tissue tract. Small stab incision was made with 11 blade scalpel, and then the catheter was back tunneled to the puncture site at the right internal jugular  vein. Catheter was pulled through the tract, with the catheter amputated at 23 cm. Catheter was advanced through the peel-away sheath, and the peel-away sheath was removed. Final image was stored. The catheter was anchored to the chest wall with 2 retention sutures, and Derma bond was used to seal the right internal jugular vein incision site and at the right chest wall. Patient tolerated the procedure well and remained hemodynamically stable throughout. No complications were encountered and no significant blood loss was encountered. IMPRESSION: Status post image guided tunneled right IJ central venous catheter. Signed, Dulcy Fanny. Dellia Nims, RPVI [facility] Radiology Electronically Signed   By: Corrie Mckusick D.O.   On: 01/28/2021 16:08   IR US Guide Vasc Access Right  Result Date: 01/28/2021 INDICATION: 87 year old male referred for central venous access EXAM: IMAGE GUIDED TUNNELED CENTRAL VENOUS CATHETER MEDICATIONS: None ANESTHESIA/SEDATION: None FLUOROSCOPY TIME:  Fluoroscopy Time:  0 minutes 6 seconds (6 mGy). COMPLICATIONS: None PROCEDURE: Informed written consent was obtained from the patient after a thorough discussion of the procedural risks, benefits and alternatives. All questions were addressed. Maximal Sterile Barrier Technique was utilized including caps, mask, sterile gowns, sterile gloves, sterile drape, hand hygiene and skin antiseptic. A timeout was performed prior to the initiation of the procedure. After written informed consent was obtained, patient was placed in the supine position on angiographic table. Patency of the right internal jugular vein was confirmed with ultrasound with image documentation. Patient was prepped and draped in the usual sterile fashion including the right neck and right superior chest. Using ultrasound guidance, the skin and subcutaneous tissues overlying the right internal jugular vein were generously infiltrated with  1% lidocaine without epinephrine. Using ultrasound guidance, the right internal jugular vein was punctured with a micropuncture needle, and an 018 wire was advanced into the right heart confirming venous access. A small stab incision was made with an 11 blade scalpel. Peel-away sheath was placed over the wire, and then the wire was removed, marking the wire for estimation of internal catheter length. The chest wall was then generously infiltrated with 1% lidocaine for local anesthesia along the tissue tract. Small stab incision was made with 11 blade scalpel, and then the catheter was back tunneled to the puncture site at the right internal jugular vein. Catheter was pulled through the tract, with the catheter amputated at 23 cm. Catheter was advanced through the peel-away sheath, and the peel-away sheath was removed. Final image was stored. The catheter was anchored to the chest wall with 2 retention sutures, and Derma bond was used to seal the right internal jugular vein incision site and at the right chest wall. Patient tolerated the procedure well and remained hemodynamically stable throughout. No complications were encountered and no significant blood loss was encountered. IMPRESSION: Status post image guided tunneled right IJ central venous catheter. Signed, Dulcy Fanny. Dellia Nims, RPVI Vascular and Interventional Radiology Specialists [facility] Radiology Electronically Signed   By: Corrie Mckusick D.O.   On: 01/28/2021 16:08    Discharge Medications   Allergies as of 01/29/2021      Reactions   Ambien [zolpidem Tartrate] Other (See Comments)   Severe agitation    Other Other (See Comments)   Bactrim [sulfamethoxazole-trimethoprim] Nausea And Vomiting, Other (See Comments)   Bleeding and ulcers   Bactrim [sulfamethoxazole-trimethoprim]    Bleeding   Morphine And Related    hypoxia   Morphine And Related Nausea And Vomiting      Medication List    TAKE these medications   Accu-Chek Aviva Plus  test strip Generic drug: glucose blood USE AS INSTRUCTED TO CHECK BLOOD SUGAR TWICE A DAY DX:E11.65   acetaminophen 500 MG tablet Commonly known as: TYLENOL Take 500 mg by mouth every 4 (four) hours as needed for moderate pain or headache.   amiodarone 200 MG tablet Commonly known as: PACERONE Take 1 tablet (200 mg total) by mouth daily.   apixaban 2.5 MG Tabs tablet Commonly known as: Eliquis Take 1 tablet (2.5 mg total) by mouth 2 (two) times daily.   B-D UF III MINI PEN NEEDLES 31G X 5 MM Misc Generic drug: Insulin Pen Needle USE 2 PEN NEEDLE PER DAY WITH HUMALOG AND VICTOZA   BD Insulin Syringe U/F 31G X 5/16" 1 ML Misc Generic drug: Insulin Syringe-Needle U-100 USE TO INJECT INSULIN DAILY. DX:E11.9   citalopram 10 MG tablet Commonly known as: CELEXA Take  1 tablet (10 mg total) by mouth daily. Start taking on: Jan 30, 2021   ferrous sulfate 325 (65 FE) MG tablet Take 325 mg by mouth 2 (two) times daily with a meal.   glimepiride 2 MG tablet Commonly known as: AMARYL TAKE 1 TABLET DAILY BEFORE BREAKFAST What changed: when to take this   HumaLOG KwikPen 100 UNIT/ML KwikPen Generic drug: insulin lispro INJECT 12 UNITS INTO THE SKIN 2 (TWO) TIMES DAILY BEFORE LUNCH AND SUPPER. INJECT 12 UNITS UNDER SKIN BEFORE LUNCH AND DINNER. MAY ALSO INJECT 4 UNIT AT BREAKFAST IS SUGAR READING IS OVER 100. What changed: See the new instructions.   insulin NPH Human 100 UNIT/ML injection Commonly known as: HumuLIN N Inject 26 units of Humulin N under the skin once daily at bedtime.   metolazone 2.5 MG tablet Commonly known as: ZAROXOLYN Take 1 tablet (2.5 mg total) by mouth as needed. As directed by the [facility] What changed:   when to take this  reasons to take this  additional instructions   milrinone 20 MG/100 ML Soln infusion Commonly known as: PRIMACOR Inject 0.0253 mg/min into the vein continuous.   multivitamin with minerals Tabs tablet Take  1 tablet by mouth daily. Mens One a day Vit   ondansetron 4 MG tablet Commonly known as: ZOFRAN Take 1 tablet (4 mg total) by mouth every 8 (eight) hours as needed for nausea or vomiting.   pantoprazole 40 MG tablet Commonly known as: PROTONIX TAKE 1 TABLET TWICE A DAY   potassium chloride SA 20 MEQ tablet Commonly known as: KLOR-CON Take 2 tablets (40 mEq total) by mouth daily. What changed: additional instructions   pravastatin 80 MG tablet Commonly known as: PRAVACHOL TAKE 1 TABLET BY MOUTH AT BEDTIME FOR HYPERLIPIDEMIA What changed: See the new instructions.   PRESERVISION AREDS 2 PO Take 1 capsule by mouth 2 (two) times daily.   tamsulosin 0.4 MG Caps capsule Commonly known as: FLOMAX Take 1 capsule daily What changed:   how much to take  how to take this  when to take this  additional instructions   torsemide 20 MG tablet Commonly known as: DEMADEX Take 5 tablets (100 mg total) by mouth 2 (two) times daily.   Victoza 18 MG/3ML Sopn Generic drug: liraglutide Inject 1.8 mg daily            Durable Medical Equipment  (From admission, onward)         Start     Ordered   01/28/21 1700  Heart failure home health orders  (Heart failure home health orders / Face to face)  Once       Comments: Heart Failure Follow-up Care:  Verify follow-up appointments per Patient Discharge Instructions. Confirm transportation arranged. Reconcile home medications with discharge medication list. Remove discontinued medications from use. Assist patient/caregiver to manage medications using pill box. Reinforce low sodium food selection Assessments: Vital signs and oxygen saturation at each visit. Assess home environment for safety concerns, caregiver support and availability of low-sodium foods. Consult Education officer, museum, PT/OT, Dietitian, and CNA based on assessments. Perform comprehensive cardiopulmonary assessment. Notify MD for any change in condition or weight gain of 3  pounds in one day or 5 pounds in one week with symptoms. Daily Weights and Symptom Monitoring: Ensure patient has access to scales. Teach patient/caregiver to weigh daily before breakfast and after voiding using same scale and record.    Teach patient/caregiver to track weight and symptoms and when to notify  Provider. Activity: Develop individualized activity plan with patient/caregiver.  NYHA CLASS IV ---> needing milrinone for home  Question Answer Comment  Heart Failure Follow-up Care Advanced Heart Failure (AHF) Clinic at [phone]   Obtain the following labs Basic Metabolic Panel   Lab frequency Other see comments   Fax lab results to AHF Clinic at [phone]   Diet Low Sodium Heart Healthy   Fluid restrictions: 2000 mL Fluid      01/28/21 1659   01/28/21 1001  Heart failure home health orders  (Heart failure home health orders / Face to face)  Once       Comments: Heart Failure Follow-up Care:  Verify follow-up appointments per Patient Discharge Instructions. Confirm transportation arranged. Reconcile home medications with discharge medication list. Remove discontinued medications from use. Assist patient/caregiver to manage medications using pill box. Reinforce low sodium food selection Assessments: Vital signs and oxygen saturation at each visit. Assess home environment for safety concerns, caregiver support and availability of low-sodium foods. Consult Education officer, museum, PT/OT, Dietitian, and CNA based on assessments. Perform comprehensive cardiopulmonary assessment. Notify MD for any change in condition or weight gain of 3 pounds in one day or 5 pounds in one week with symptoms. Daily Weights and Symptom Monitoring: Ensure patient has access to scales. Teach patient/caregiver to weigh daily before breakfast and after voiding using same scale and record.    Teach patient/caregiver to track weight and symptoms and when to notify Provider. Activity: Develop individualized  activity plan with patient/caregiver.  Home Paraenteral Inotropic Therapy : Data Collection Form  Patients name: Rocket Gunderson Imperato   Date: 01/28/21  Information below may not be completed by the supplier nor anyone in a Financial relationship with the supplier.  1. Results of invasive hemodynamic monitoring  Cardiac Index Before Inotrope infusion:          1. 6           On Inotrope infusion:            2.1           Drug and dose:   Milrinone 0.25 mcg.   2. Cardiac medications immediately prior to inotrope infusion (List name, dose, and frequency) Lasix drip   3. Dose this represent maximum tolerated doses of these medications? Yes.  4. Breathing status Prior to inotrope infusion: Dyspnea at rest  At time of discharge: Dyspnea on moderate exertion.   5. Initial home prescription Drug and Dose:   0.25 mcg for continuous infusion 24/hr day and 7 days/week  6. If continuous infusion is prescribed, have attempts to discontinue inotrope infusion in the hospital failed?   Yes.   7. If intermittent infusion is prescribed, have there been repeated hospitalizations for heart failure which Parenteral inotrope were required? Not applicable.   8. Is patient capable of going to the physician for outpatient evaluation? Yes.    9. Is routine electrocardiographic monitoring required in the Home?  No.   The above statements and any additional explanations included separately are true and accurate and there is documentation present in the patients medical record to support these statements.   Completed by Darrick Grinder, NP   In instances where this form was completed by an Advanced Practice Provider, please see EMR for physician Co-Signature.  Question Answer Comment  Heart Failure Follow-up Care Advanced Heart Failure (AHF) Clinic at [phone]   Obtain the following labs Basic Metabolic Panel   Lab frequency Weekly   Fax lab results to AHF Clinic  at [phone]   Diet Low Sodium Heart  Healthy   Fluid restrictions: 1800 mL Fluid      01/28/21 1002          Disposition   The patient will be discharged in stable condition to home.   Follow-up Information    [facility] Follow up.   Why: outpatient palliative services Contact information: [place] [address] [phone]       Howard Pouch A, DO Follow up on 02/04/2021.   Specialty: Family Medicine Why: 11:30 Contact information: [address] [place] [address] [phone]        [facility], [facility] Follow up.   Specialty: [place] Why: A representative from [facility] will contact you to arrange start date and time for your therapy Contact information: [address] [place]  [address] [phone]        [facility] Follow up.   Specialty: Cardiology Why: February 20, 2021 at 9:30 AM at the [facility], [place] code 4233 Contact information: [address] [id number] [place] [phone]                Duration of Discharge Encounter: Greater than 35 minutes   Signed, Lyda Jester, PA-C  01/29/2021, 12:39 PM

## 2021-01-29 NOTE — Progress Notes (Signed)
Orthopedic Tech Progress Note Patient Details:  Todd Romero [birth year] [id number]  Ortho Devices Type of Ortho Device: Haematologist Ortho Device/Splint Location: Bilateral Ortho Device/Splint Interventions: Ordered,Application   Post Interventions Patient Tolerated: Well   [name] A [name] 01/29/2021, 3:05 PM

## 2021-01-30 ENCOUNTER — Telehealth: Payer: Self-pay

## 2021-01-30 ENCOUNTER — Telehealth: Payer: Self-pay | Admitting: Emergency Medicine

## 2021-01-30 LAB — CUP PACEART REMOTE DEVICE CHECK
Battery Remaining Longevity: 75 mo
Battery Voltage: 3.14 V
Brady Statistic AP VP Percent: 94.9 %
Brady Statistic AP VS Percent: 1.16 %
Brady Statistic AS VP Percent: 3.65 %
Brady Statistic AS VS Percent: 0.3 %
Brady Statistic RA Percent Paced: 50.56 %
Brady Statistic RV Percent Paced: 96.27 %
Date Time Interrogation Session: 20220526163035
Implantable Lead Implant Date: 20210218
Implantable Lead Implant Date: 20210218
Implantable Lead Implant Date: 20220328
Implantable Lead Location: 753858
Implantable Lead Location: 753859
Implantable Lead Location: 753860
Implantable Lead Model: 3830
Implantable Lead Model: 4396
Implantable Lead Model: 5076
Implantable Pulse Generator Implant Date: 20220328
Lead Channel Impedance Value: 1007 Ohm
Lead Channel Impedance Value: 304 Ohm
Lead Channel Impedance Value: 323 Ohm
Lead Channel Impedance Value: 456 Ohm
Lead Channel Impedance Value: 475 Ohm
Lead Channel Impedance Value: 551 Ohm
Lead Channel Impedance Value: 570 Ohm
Lead Channel Impedance Value: 684 Ohm
Lead Channel Impedance Value: 703 Ohm
Lead Channel Pacing Threshold Amplitude: 0.75 V
Lead Channel Pacing Threshold Amplitude: 0.875 V
Lead Channel Pacing Threshold Amplitude: 2.375 V
Lead Channel Pacing Threshold Pulse Width: 0.4 ms
Lead Channel Pacing Threshold Pulse Width: 0.4 ms
Lead Channel Pacing Threshold Pulse Width: 0.8 ms
Lead Channel Sensing Intrinsic Amplitude: 0.5 mV
Lead Channel Sensing Intrinsic Amplitude: 0.5 mV
Lead Channel Sensing Intrinsic Amplitude: 8 mV
Lead Channel Sensing Intrinsic Amplitude: 8 mV
Lead Channel Setting Pacing Amplitude: 1.75 V
Lead Channel Setting Pacing Amplitude: 2.5 V
Lead Channel Setting Pacing Amplitude: 3.25 V
Lead Channel Setting Pacing Pulse Width: 0.4 ms
Lead Channel Setting Pacing Pulse Width: 0.8 ms
Lead Channel Setting Sensing Sensitivity: 0.9 mV

## 2021-01-30 NOTE — Telephone Encounter (Signed)
Spoke with patient's wife [name] and scheduled an in-person Palliative Consult for 03/11/21 @ 12:30PM  COVID screening was negative. No pets in home. Patient lives with wife and son.  Consent obtained; updated Outlook/Netsmart/Team List and Epic.   Family is aware they may be receiving a call from NP the day before or day of to confirm appointment.

## 2021-01-30 NOTE — Telephone Encounter (Signed)
LMOM to call device clinic with #. Alert for increased AF burden and ongoing AF. + Eliquis, Assess for s/sx.

## 2021-02-03 ENCOUNTER — Telehealth: Payer: Self-pay

## 2021-02-03 NOTE — Telephone Encounter (Signed)
Transition Care Management Follow-up Telephone Call  Date of discharge and from where: 01/29/21-[facility]  How have you been since you were released from the hospital? Per wife doing ok but still weak.  Any questions or concerns? Yes  Items Reviewed:  Did the pt receive and understand the discharge instructions provided? Yes   Medications obtained and verified? Yes   Other? Yes   Any new allergies since your discharge? No   Dietary orders reviewed? Yes  Do you have support at home? Yes   Home Care and Equipment/Supplies: Were home health services ordered? yes If so, what is the name of the agency? [facility] ,[facility] Has the agency set up a time to come to the patient's home? no Were any new equipment or medical supplies ordered?[facility]-  No  [facility]-yes What is the name of the medical supply agency? n/a Were you able to get the supplies/equipment? not applicable Do you have any questions related to the use of the equipment or supplies?n/a Functional Questionnaire: (I = Independent and D = Dependent) ADLs: I with assistance  Bathing/Dressing- I with assistance  Meal Prep- D  Eating- I  Maintaining continence- I with assistance  Transferring/Ambulation- I with [place] & assistance  Managing Meds- D  Follow up appointments reviewed:   PCP Hospital f/u appt confirmed? Yes  Scheduled to see Dr. Raoul Pitch on 02/05/21 @ 11;30.  [facility] f/u appt confirmed? Yes  Scheduled to see [facility] on 02/20/21 @ 9:30  Are transportation arrangements needed? No   If their condition worsens, is the pt aware to call PCP or go to the Emergency Dept.? Yes  Was the patient provided with contact information for the PCP's office or ED? Yes  Was to pt encouraged to call back with questions or concerns? Yes

## 2021-02-03 NOTE — Telephone Encounter (Signed)
Transition Care Management Unsuccessful Follow-up Telephone Call  Date of discharge and from where:  01/29/21-[facility]  Attempts:  2nd Attempt  Reason for unsuccessful TCM follow-up call:  No answer/busy

## 2021-02-04 ENCOUNTER — Other Ambulatory Visit: Payer: Self-pay

## 2021-02-04 ENCOUNTER — Telehealth: Payer: Medicare Other | Admitting: Family Medicine

## 2021-02-04 DIAGNOSIS — I5023 Acute on chronic systolic (congestive) heart failure: Secondary | ICD-10-CM

## 2021-02-04 MED ORDER — ONDANSETRON HCL 4 MG PO TABS: 4.0000 mg | ORAL_TABLET | Freq: Three times a day (TID) | ORAL | 2 refills | Status: AC | PRN

## 2021-02-04 NOTE — Telephone Encounter (Signed)
LMOM to send transmission. Need to verify if he is in AF.

## 2021-02-04 NOTE — Progress Notes (Signed)
appt canceled. Pt/spouse unable to connect to virtual platform.

## 2021-02-05 ENCOUNTER — Other Ambulatory Visit (HOSPITAL_COMMUNITY): Payer: Self-pay

## 2021-02-05 NOTE — Progress Notes (Signed)
Paramedicine Encounter    Patient ID: Todd Romero, male    DOB: [birth year], 87 y.o.   MRN: [id number]   Patient Care Team: Ma Hillock, DO as PCP - General (Family Medicine) Belva Crome, MD as PCP - Cardiology (Cardiology) [name], MD as Consulting Physician (Endocrinology) Thompson Grayer, MD as Consulting Physician (Cardiology) Danice Goltz, MD as Consulting Physician (Ophthalmology) Monna Fam, MD as Consulting Physician (Ophthalmology) Carol Ada, MD as Consulting Physician (Gastroenterology) Evans Lance, MD as Consulting Physician (Cardiology) Ma Hillock, DO (Family Medicine)  Patient Active Problem List   Diagnosis Date Noted  . Acute on chronic systolic heart failure, NYHA class 3 ([place]) 01/21/2021  . Gastroesophageal reflux disease 11/05/2020  . Morbid obesity ([place]) 11/05/2020  . Anxiety 10/22/2020  . Generalized weakness 10/21/2020  . At high risk for falls 10/21/2020  . Tremor 10/18/2020  . Acute on chronic diastolic HF (heart failure) ([place]) 10/01/2020  . Thyroid nodule greater than or equal to 1 cm in diameter incidentally noted on imaging study 09/01/2020  . Spondylosis, cervical 09/01/2020  . Sleep disturbance 04/23/2020  . Tachycardia-bradycardia syndrome ([place]) 02/06/2020  . Pacemaker 11/08/2019  . Chronic anticoagulation 11/08/2019  . Benign prostatic hyperplasia 10/01/2019  . Stage 3b chronic kidney disease ([place]) 10/01/2019  . 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 Chronic combined systolic and diastolic CHF (congestive heart failure) ([place]) 03/27/2018  . Persistent atrial fibrillation ([place]) 03/04/2018  . GIB (gastrointestinal bleeding) 11/18/2017  . Anemia 11/16/2017  . 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 Aortic stenosis 12/18/2015  . Obesity (BMI 30-39.9) 06/18/2015  . Obstructive sleep apnea 02/11/2015  . Primary osteoarthritis of right knee 03/18/2014  . Essential hypertension, benign  06/21/2013    Class: Chronic  . RBBB   . CAD (coronary artery disease)     Class: Chronic  . Hyperlipidemia   . Type 2 diabetes mellitus ([place]) 05/02/2013  . Retinal tear, left 11/16/2011    Current Outpatient Medications:  .  ACCU-CHEK AVIVA PLUS test strip, USE AS INSTRUCTED TO CHECK BLOOD SUGAR TWICE A DAY DX:E11.65, Disp: 100 strip, Rfl: 2 .  acetaminophen (TYLENOL) 500 MG tablet, Take 500 mg by mouth every 4 (four) hours as needed for moderate pain or headache., Disp: , Rfl:  .  amiodarone (PACERONE) 200 MG tablet, Take 1 tablet (200 mg total) by mouth daily., Disp: 30 tablet, Rfl: 5 .  apixaban (ELIQUIS) 2.5 MG TABS tablet, Take 1 tablet (2.5 mg total) by mouth 2 (two) times daily., Disp: 180 tablet, Rfl: 1 .  B-D UF III MINI PEN NEEDLES 31G X 5 MM MISC, USE 2 PEN NEEDLE PER DAY WITH HUMALOG AND VICTOZA, Disp: 200 each, Rfl: 3 .  BD INSULIN SYRINGE U/F 31G X 5/16" 1 ML MISC, USE TO INJECT INSULIN DAILY. DX:E11.9, Disp: 100 each, Rfl: 1 .  citalopram (CELEXA) 10 MG tablet, Take 1 tablet (10 mg total) by mouth daily., Disp: 30 tablet, Rfl: 3 .  ferrous sulfate 325 (65 FE) MG tablet, Take 325 mg by mouth 2 (two) times daily with a meal. , Disp: , Rfl:  .  glimepiride (AMARYL) 2 MG tablet, TAKE 1 TABLET DAILY BEFORE BREAKFAST (Patient taking differently: Take 2 mg by mouth daily with breakfast.), Disp: 90 tablet, Rfl: 3 .  HUMALOG KWIKPEN 100 UNIT/ML KwikPen, INJECT 12 UNITS INTO THE SKIN 2 (TWO) TIMES DAILY BEFORE LUNCH AND SUPPER. INJECT 12 UNITS UNDER SKIN BEFORE LUNCH AND DINNER. MAY ALSO INJECT  4 UNIT AT BREAKFAST IS SUGAR READING IS OVER 100. (Patient taking differently: Inject 12 Units into the skin See admin instructions. Inject 12 units into the skin 2 times daily before lunch and supper. May also inject 4 units at breakfast if reading is over 100), Disp: 15 mL, Rfl: 1 .  insulin NPH Human (HUMULIN N) 100 UNIT/ML injection, Inject 26 units of Humulin N under the skin once daily at  bedtime. (Patient taking differently: Inject 26 units of Humulin N under the skin once daily at bedtime.), Disp: 90 mL, Rfl: 1 .  liraglutide (VICTOZA) 18 MG/3ML SOPN, Inject 1.8 mg daily, Disp: 27 mL, Rfl: 3 .  metolazone (ZAROXOLYN) 2.5 MG tablet, Take 1 tablet (2.5 mg total) by mouth as needed. As directed by the [facility], Disp: 39 tablet, Rfl: 3 .  milrinone (PRIMACOR) 20 MG/100 ML SOLN infusion, Inject 0.0253 mg/min into the vein continuous., Disp: , Rfl:  .  Multiple Vitamin (MULTIVITAMIN WITH MINERALS) TABS tablet, Take 1 tablet by mouth daily. Mens One a day Vit, Disp: , Rfl:  .  Multiple Vitamins-Minerals (PRESERVISION AREDS 2 PO), Take 1 capsule by mouth 2 (two) times daily., Disp: , Rfl:  .  ondansetron (ZOFRAN) 4 MG tablet, Take 1 tablet (4 mg total) by mouth every 8 (eight) hours as needed for nausea or vomiting., Disp: 30 tablet, Rfl: 2 .  pantoprazole (PROTONIX) 40 MG tablet, TAKE 1 TABLET TWICE A DAY (Patient taking differently: Take 40 mg by mouth 2 (two) times daily.), Disp: 180 tablet, Rfl: 3 .  potassium chloride SA (KLOR-CON) 20 MEQ tablet, Take 2 tablets (40 mEq total) by mouth daily., Disp: 192 tablet, Rfl: 3 .  pravastatin (PRAVACHOL) 80 MG tablet, TAKE 1 TABLET BY MOUTH AT BEDTIME FOR HYPERLIPIDEMIA (Patient taking differently: Take 80 mg by mouth daily.), Disp: 30 tablet, Rfl: 6 .  tamsulosin (FLOMAX) 0.4 MG CAPS capsule, Take 1 capsule daily (Patient taking differently: Take 0.4 mg by mouth daily.), Disp: 90 capsule, Rfl: 1 .  torsemide (DEMADEX) 20 MG tablet, Take 5 tablets (100 mg total) by mouth 2 (two) times daily., Disp: 300 tablet, Rfl: 11 Allergies  Allergen Reactions  . Ambien [Zolpidem Tartrate] Other (See Comments)    Severe agitation   . Other Other (See Comments)  . Bactrim [Sulfamethoxazole-Trimethoprim] Nausea And Vomiting and Other (See Comments)    Bleeding and ulcers  . Bactrim [Sulfamethoxazole-Trimethoprim]     Bleeding  .  Morphine And Related     hypoxia  . Morphine And Related Nausea And Vomiting      Social History   Socioeconomic History  . Marital status: Married    Spouse name: Not on file  . Number of children: 4  . Years of education: Not on file  . Highest education level: Not on file  Occupational History  . Occupation: Construction-Retired  Tobacco Use  . Smoking status: Never Smoker  . Smokeless tobacco: Never Used  Vaping Use  . Vaping Use: Never used  Substance and Sexual Activity  . Alcohol use: Not Currently  . Drug use: Not Currently  . Sexual activity: Not Currently    Partners: Female  Other Topics Concern  . Not on file  Social History Narrative   ** Merged History Encounter **       Marital status/children/pets: Married 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, retired Recruitment consultant:    -smoke alarm in the home:Yes   - wears seatbelt: Yes   - Feels  safe in their relationships: Yes    Social Determinants of Health   Financial Resource Strain: Low Risk   . Difficulty of Paying Living Expenses: Not very hard  Food Insecurity: No Food Insecurity  . Worried About Charity fundraiser in the Last Year: Never true  . Ran Out of Food in the Last Year: Never true  Transportation Needs: No Transportation Needs  . Lack of Transportation (Medical): No  . Lack of Transportation (Non-Medical): No  Physical Activity: Not on file  Stress: Not on file  Social Connections: Not on file  Intimate Partner Violence: Not on file    Physical Exam      Future Appointments  Date Time Provider [place]  02/20/2021  9:30 AM [facility] PA/NP [facility] None  03/11/2021 12:30 PM Ezekiel Slocumb, NP [facility] None  03/12/2021  3:45 PM Evans Lance, MD [facility]  04/29/2021  7:00 AM [facility] DEVICE REMOTES [facility]  07/29/2021  7:00 AM [facility] DEVICE REMOTES [facility]  10/28/2021  7:00 AM [facility] DEVICE REMOTES  [facility]  01/27/2022  7:00 AM [facility] DEVICE REMOTES [facility]  04/28/2022  7:00 AM [facility] DEVICE REMOTES [facility]  07/28/2022  7:00 AM [facility] DEVICE REMOTES [facility]  10/27/2022  7:00 AM [facility] DEVICE REMOTES [facility]    BP 110/60   Pulse 70   Resp 18   Wt 199 lb (90.3 kg)   SpO2 98%   BMI 31.17 kg/m   Weight yesterday-? Last visit weight-223  Pt recently home from the hosp, he is now on milrinone, the nurse came out today to change it but they have not heard from [facility] home health yet for the PT/OT.  Wife reports his breathing is worse, his weakness is getting worse. He was sent home with unna boots but wife had to cut them off due to it being so tight on him.  Poor appetite. Still edema to legs. Rt side is worse than the left-he reports that side has always had issues with circulation due to an injury when he was younger.  He has been constipated. Very small, little amount of BM since he has been home.  Lungs clear. He does get sob upon lying in recliner.  They were not able to do the video visit yesterday due to IT issues.  Wife reports he does get nausea. also reports that he is not staying asleep once he gets to sleep, he gets very anxious.  Getting him to be seen in office is very difficult for him, its extremely hard to get him out of the house. He is making to bathroom ok and gets up to sit at table.  His GOC is to stay at home.  Asked jenna to help to see what she can find out about the [facility] referral.   Marylouise Stacks, [place] Paramedic  02/05/21

## 2021-02-06 ENCOUNTER — Telehealth: Payer: Self-pay

## 2021-02-06 ENCOUNTER — Telehealth (HOSPITAL_COMMUNITY): Payer: Self-pay | Admitting: Licensed Clinical Social Worker

## 2021-02-06 ENCOUNTER — Telehealth: Payer: Self-pay | Admitting: Family Medicine

## 2021-02-06 NOTE — Telephone Encounter (Signed)
[facility] Paramedic informed CSW that pt had not been picked up by home health yet and asked for assistance in following up.  CSW called [facility] rep where pt had been referred.  They confirm they tried to see pt Monday but was told by the family not to come out that day so was rescheduled to be seen today.  They will have the worker follow up with the pt/family directly to confirm their appt with them today.  No further needs at this time  Jorge Ny, [facility] Desk#: [phone] Cell#: [phone]

## 2021-02-06 NOTE — Telephone Encounter (Signed)
Nurse manager Tinika B. Contacting palliative care and [facility] resources that had been initiated for patient prior to hospital discharge. She also contacted wife to inform her  And provide further instruction in the event palliative is unable to contact her before weekend.

## 2021-02-06 NOTE — Telephone Encounter (Signed)
Todd Romero is calling in from [facility] wondering if Dr.Kuneff will be patients attending provider.

## 2021-02-06 NOTE — Telephone Encounter (Signed)
Please call wife regarding patient needing something to help him to be comfortable.  Please call [phone]

## 2021-02-06 NOTE — Telephone Encounter (Signed)
Yes. I agree with hospice order. Milrinone orders are through cardiology.   Thank you!

## 2021-02-06 NOTE — Telephone Encounter (Signed)
Great. Yes- agree to Hospice.

## 2021-02-06 NOTE — Telephone Encounter (Signed)
Returned call to patient's wife re: patient's discomfort. Wife shared that patient has been nauseated and quite restless. He has displayed increased anxiety and will call for his wife if she is out of site. Patient was recently hospitalized and wife shared that patient is getting closer to end of life. PO intake is minimal. Discussed Hospice services with patient's wife who is in agreement with this if patient is eligible.  Updated Palliative NP and Medical Director.

## 2021-02-06 NOTE — Telephone Encounter (Signed)
-----   Message from Cornelius Moras, RN sent at 02/06/2021  3:39 PM EDT ----- Regarding: Hospice Order  Jodi Marble,     I spoke with Mrs. Harkin this afternoon. She shared that this patient continues with nausea, poor po intake and weakness. We discussed hospice services and they are open to transition to hospice services. Our doctor did say that the patient will be able to stay on the Milrinone. I wanted to see if Dr. Raoul Pitch is in agreement with this and would give me an order for hospice eval.                                        Maxwell Caul RN

## 2021-02-06 NOTE — Telephone Encounter (Signed)
At direction of Dr. Konrad Dolores, Haldol 5mg  Q 6 Hours prn for nausea and restlessness called to [facility]. Patient's wife made aware

## 2021-02-06 NOTE — Telephone Encounter (Signed)
It is preferred when the patient is to go on hospice they have a hospice provider take over as attending, or in this case possibly cardiology since they are providing the orders for the condition in which he is suffering.  Thanks.

## 2021-02-07 ENCOUNTER — Telehealth: Payer: Self-pay | Admitting: Internal Medicine

## 2021-02-07 NOTE — Telephone Encounter (Signed)
Cardiology Moonlighter Note  Returned page from patient's wife.  Patient has a complex medical history including severe aortic stenosis, reduced ejection fraction heart failure, diabetes, CKD.  He was recently admitted to the hospital for acute heart failure and was discharged home on milrinone with plans for outpatient hospice.  The patient's wife states that he is supposed to be registered for hospice on Monday.  After discharge, the patient was recommended to start taking Haldol 5 mg every 6 hours as needed for nausea and restlessness.  He was told that he had to wait until he was registered for hospice before the hospice team could start making additional recommendations regarding symptom relief.  The patient's wife states he has been having severe anxiety and restlessness over the past day.  He hardly slept at all last night.  She states he is laying in bed asking for her to help him, but does not have any other specific complaints.  She does not think that this is delirium, but rather thinks he is having recurrent acute anxiety.  He has been taking the Haldol 5 mg every 6 hours as prescribed with modest relief.  He has also been taking Zofran 4 mg every 8 hours for nausea.  During recent hospitalization, the patient was also given Atarax and Xanax for anxiety, however the wife does not think that these helped him very much.  She is hoping that we could prescribe an additional medication to help him with his anxiety.  I explained to the patient's wife that this is a very challenging situation as he is already on a fairly high dose of Haldol and that additional medication changes would be best done by the hospice team.  She also understands that he is at risk from having complications such as arrhythmias by combining medications such as higher dose Haldol with Zofran.  I do not believe it is wise for me to prescribe him benzodiazepines over the phone without having personally assessed him, particularly given  that he was given Xanax during recent hospitalization and his symptoms of anxiety did not respond.  I recommended that the patient try taking 7.5 mg of Haldol for his evening dose tonight and see if this helps him to sleep through the night.  He should skip his Zofran with this dose given the possibility for QTC prolongation.  If this helps, he can do the same thing tomorrow night to get him through until Monday when he is seen by his hospice team.  If this does not help, the patient can call back tomorrow for additional recommendations.  I also offered to assess the patient in the emergency department, however the patient's wife does not think this would be wise given that may likely worsen his anxiety and possible delirium.  I to agree with this.  A copy of this message will be sent to the patient's outpatient team.  Marcie Mowers, MD Cardiology Fellow, PGY-8

## 2021-02-09 ENCOUNTER — Telehealth (HOSPITAL_COMMUNITY): Payer: Self-pay

## 2021-02-09 NOTE — Telephone Encounter (Signed)
LVM for Todd Romero to CB regarding mutual pt.

## 2021-02-09 NOTE — Telephone Encounter (Signed)
Thank you for speaking to Todd Romero.  It is a difficult situation.  Palliative care had been requested upon hospital discharge and per inpatient team he had not met criteria for hospice as of yet.   He was unable to make his hospital follow-up appointment due to weakness and declined further admission.  We attempted to contact via virtual visit for them also, and they were unable to connect virtually for that appointment as well.   After discussing via telephone Todd Romero's further decline post hospital discharge, we attempted to set up hospice immediately for them.  Unfortunately, Hospice was  unable to get his initial evaluation set up before the weekend. They should be out there today and provide him with comfort measures.   Thank you.

## 2021-02-09 NOTE — Telephone Encounter (Signed)
Pt is now in hospice care.  Wife reports over the wknd things have progressed poorly and now he is more unresponsive. Hospice was there this morning to get him enrolled and begin services, they are ordering him a hospital bed etc.  Pt will be removed from paramedicine at this time as care will be focused on comfort measures until EOL.   Marylouise Stacks, EMT-Paramedic  02/09/21

## 2021-02-10 NOTE — Telephone Encounter (Signed)
LVM for Todd Romero to CB regarding mutual pt.

## 2021-02-10 NOTE — Telephone Encounter (Signed)
Spoke with wife and patient is now on hospice care. She reports patient has had a remarkable decline in strength since discharge from the hospital on 01/28/21. Patient is currently sleeping in a recliner and will be sleeping in a hospital bed as of tonight. Wife instructed to move monitor to within 3-6 feet of where the patient is sleeping and she will call the device clinic tomorrow morning to see if transmission received.

## 2021-02-11 ENCOUNTER — Other Ambulatory Visit (HOSPITAL_COMMUNITY): Payer: Self-pay | Admitting: Cardiology

## 2021-02-11 NOTE — Telephone Encounter (Signed)
Spoke with pt wife. Confirmed that pt continues in AF.  She reports that someone already called her to tell her this.  She wanted to know if patient wasn't in AF would he still need Hospice care.  Advised I could not answer that question.

## 2021-02-11 NOTE — Telephone Encounter (Signed)
The patient wife called wanting the results of his transmission. I told her the nurse will give her a call back. Transmission received.

## 2021-02-11 NOTE — Telephone Encounter (Signed)
Successful telephone encounter to [name] [place] who was enquiring if patient was still in AF on today's remote transmission. Of note patient has been transitioned to [facility]. Discussed transmission with wife who was grateful of the care patient has received with [facility]. Patient remains in AF with controlled V rates. AT/AF therapies programmed off. Will continue to monitor patient with remotes per wife request. Care driven by Hospice at this time. Emotional support offered to wife and she was encouraged to call with any additional questions or concerns. Wife thankful for return call.

## 2021-02-11 NOTE — Telephone Encounter (Signed)
LVM for Tammy to CB regarding pt and inform via VM that this encounter will be closed since this is th 3rd attempt.

## 2021-02-12 ENCOUNTER — Encounter (HOSPITAL_COMMUNITY): Payer: Medicare Other

## 2021-02-13 ENCOUNTER — Telehealth (HOSPITAL_COMMUNITY): Payer: Self-pay | Admitting: *Deleted

## 2021-02-13 NOTE — Telephone Encounter (Signed)
Received a call from [facility] with Hospice stating patient was admitted to hospice Monday and she say him Tuesday. Wife was not agreeable with hospice care. Wife wants weekly lab work and more invasive care than what hospice services cover. Dr.Feldman with hospice asked if pt should still even be on milrinone. Pts wife wants all life saving measures at this time and wants to "fight" she is not ready for pt to have comfort care per Kristin,RN. Wife [name] said she doesn't even understand why hospice referral was made. Erasmo Downer asked that [place] call [name] directly and have a discussion about whether or not hospice care is right for pt. If she refuses hospice they need an order to d/c services and pt would need an order with [facility] to continue care for milrinone. Wife [name] expects a call from [place] per Erasmo Downer.   Routed to [place] call back # [phone]

## 2021-02-13 NOTE — Telephone Encounter (Signed)
Would like him to continue with milrinone and diuretics for comfort.  Discussed this with wife.  Will therefore need labwork.  Will have office contact hospice.

## 2021-02-13 NOTE — Telephone Encounter (Signed)
Will refer pt to [facility] there hospice service is ok with milrinone use per Heather. Called Erasmo Downer to d/c care with her hospice program no answer/left vm. Will f/u Monday. Per previous conversation with Cyril Mourning another RN will f/u with pt Monday.

## 2021-02-13 NOTE — Telephone Encounter (Signed)
Called pts wife [name] to discuss. No answer/left vm.

## 2021-02-17 NOTE — Telephone Encounter (Signed)
Spoke with Tammy who stated that Dr. Kirk Ruths is the attending for pt

## 2021-02-20 ENCOUNTER — Encounter (HOSPITAL_COMMUNITY): Payer: Medicare Other

## 2021-02-20 NOTE — Progress Notes (Signed)
Remote pacemaker transmission.   

## 2021-02-21 ENCOUNTER — Other Ambulatory Visit: Payer: Self-pay | Admitting: Cardiology

## 2021-03-08 ENCOUNTER — Encounter (HOSPITAL_BASED_OUTPATIENT_CLINIC_OR_DEPARTMENT_OTHER): Payer: Self-pay | Admitting: *Deleted

## 2021-03-08 ENCOUNTER — Emergency Department (HOSPITAL_BASED_OUTPATIENT_CLINIC_OR_DEPARTMENT_OTHER)

## 2021-03-08 ENCOUNTER — Emergency Department (HOSPITAL_BASED_OUTPATIENT_CLINIC_OR_DEPARTMENT_OTHER)
Admission: EM | Admit: 2021-03-08 | Discharge: 2021-03-09 | Disposition: A | Discharge status: Home / Self Care | Attending: Emergency Medicine | Admitting: Emergency Medicine

## 2021-03-08 ENCOUNTER — Other Ambulatory Visit: Payer: Self-pay

## 2021-03-08 DIAGNOSIS — I130 Hypertensive heart and chronic kidney disease with heart failure and stage 1 through stage 4 chronic kidney disease, or unspecified chronic kidney disease: Secondary | ICD-10-CM | POA: Insufficient documentation

## 2021-03-08 DIAGNOSIS — K5641 Fecal impaction: Secondary | ICD-10-CM

## 2021-03-08 DIAGNOSIS — I5042 Chronic combined systolic (congestive) and diastolic (congestive) heart failure: Secondary | ICD-10-CM | POA: Diagnosis not present

## 2021-03-08 DIAGNOSIS — E1122 Type 2 diabetes mellitus with diabetic chronic kidney disease: Secondary | ICD-10-CM | POA: Diagnosis not present

## 2021-03-08 DIAGNOSIS — E876 Hypokalemia: Secondary | ICD-10-CM

## 2021-03-08 DIAGNOSIS — E871 Hypo-osmolality and hyponatremia: Secondary | ICD-10-CM

## 2021-03-08 DIAGNOSIS — Z7984 Long term (current) use of oral hypoglycemic drugs: Secondary | ICD-10-CM | POA: Diagnosis not present

## 2021-03-08 DIAGNOSIS — N1832 Chronic kidney disease, stage 3b: Secondary | ICD-10-CM | POA: Insufficient documentation

## 2021-03-08 DIAGNOSIS — K5900 Constipation, unspecified: Secondary | ICD-10-CM | POA: Diagnosis not present

## 2021-03-08 DIAGNOSIS — Z950 Presence of cardiac pacemaker: Secondary | ICD-10-CM | POA: Diagnosis not present

## 2021-03-08 DIAGNOSIS — Z96651 Presence of right artificial knee joint: Secondary | ICD-10-CM | POA: Diagnosis not present

## 2021-03-08 DIAGNOSIS — Z79899 Other long term (current) drug therapy: Secondary | ICD-10-CM | POA: Diagnosis not present

## 2021-03-08 DIAGNOSIS — Z794 Long term (current) use of insulin: Secondary | ICD-10-CM | POA: Diagnosis not present

## 2021-03-08 DIAGNOSIS — I2510 Atherosclerotic heart disease of native coronary artery without angina pectoris: Secondary | ICD-10-CM | POA: Insufficient documentation

## 2021-03-08 DIAGNOSIS — Z7901 Long term (current) use of anticoagulants: Secondary | ICD-10-CM | POA: Diagnosis not present

## 2021-03-08 DIAGNOSIS — I4819 Other persistent atrial fibrillation: Secondary | ICD-10-CM | POA: Insufficient documentation

## 2021-03-08 LAB — COMPREHENSIVE METABOLIC PANEL
ALT: 16 U/L (ref 0–44)
AST: 24 U/L (ref 15–41)
Albumin: 3.1 g/dL — ABNORMAL LOW (ref 3.5–5.0)
Alkaline Phosphatase: 87 U/L (ref 38–126)
Anion gap: 10 (ref 5–15)
BUN: 28 mg/dL — ABNORMAL HIGH (ref 8–23)
CO2: 26 mmol/L (ref 22–32)
Calcium: 9 mg/dL (ref 8.9–10.3)
Chloride: 85 mmol/L — ABNORMAL LOW (ref 98–111)
Creatinine, Ser: 1.19 mg/dL (ref 0.61–1.24)
GFR, Estimated: >60 mL/min (ref >60)
Glucose, Bld: 182 mg/dL — ABNORMAL HIGH (ref 70–99)
Potassium: 2.9 mmol/L — ABNORMAL LOW (ref 3.5–5.1)
Sodium: 121 mmol/L — ABNORMAL LOW (ref 135–145)
Total Bilirubin: 0.8 mg/dL (ref 0.3–1.2)
Total Protein: 6.4 g/dL — ABNORMAL LOW (ref 6.5–8.1)

## 2021-03-08 LAB — CBC WITH DIFFERENTIAL/PLATELET
Abs Immature Granulocytes: 0.06 10*3/uL (ref 0.00–0.07)
Basophils Absolute: 0 10*3/uL (ref 0.0–0.1)
Basophils Relative: 0 %
Eosinophils Absolute: 0 10*3/uL (ref 0.0–0.5)
Eosinophils Relative: 0 %
HCT: 36.2 % — ABNORMAL LOW (ref 39.0–52.0)
Hemoglobin: 12.5 g/dL — ABNORMAL LOW (ref 13.0–17.0)
Immature Granulocytes: 1 %
Lymphocytes Relative: 7 %
Lymphs Abs: 0.8 10*3/uL (ref 0.7–4.0)
MCH: 29.3 pg (ref 26.0–34.0)
MCHC: 34.5 g/dL (ref 30.0–36.0)
MCV: 84.8 fL (ref 80.0–100.0)
Monocytes Absolute: 1.2 10*3/uL — ABNORMAL HIGH (ref 0.1–1.0)
Monocytes Relative: 9 %
Neutro Abs: 10.1 10*3/uL — ABNORMAL HIGH (ref 1.7–7.7)
Neutrophils Relative %: 83 %
Platelets: 218 10*3/uL (ref 150–400)
RBC: 4.27 MIL/uL (ref 4.22–5.81)
RDW: 15.5 % (ref 11.5–15.5)
WBC: 12.2 10*3/uL — ABNORMAL HIGH (ref 4.0–10.5)
nRBC: 0 % (ref 0.0–0.2)

## 2021-03-08 MED ORDER — MAGNESIUM CITRATE PO SOLN
1.0000 | Freq: Once | ORAL | Status: AC
  Administered 2021-03-08: 1 via ORAL
  Filled 2021-03-08: qty 296

## 2021-03-08 MED ORDER — POTASSIUM CHLORIDE 10 MEQ/100ML IV SOLN
10.0000 meq | INTRAVENOUS | Status: AC
  Administered 2021-03-08 (×2): 10 meq via INTRAVENOUS
  Filled 2021-03-08 (×2): qty 100

## 2021-03-08 MED ORDER — LACTULOSE 10 GM/15ML PO SOLN: 30.0000 g | Freq: Once | ORAL | Status: DC

## 2021-03-08 MED ORDER — POTASSIUM CHLORIDE 20 MEQ PO PACK: 40.0000 meq | PACK | Freq: Every day | ORAL | 0 refills | Status: AC

## 2021-03-08 MED ORDER — MINERAL OIL RE ENEM: 1.0000 | ENEMA | Freq: Once | RECTAL | Status: DC

## 2021-03-08 MED ORDER — POLYETHYLENE GLYCOL 3350 17 G PO PACK: 17.0000 g | PACK | Freq: Two times a day (BID) | ORAL | 0 refills | Status: AC

## 2021-03-08 NOTE — ED Triage Notes (Signed)
Pt brought in by EMS from home. Pt is under hospice care. Reports constipation x 2 weeks. Family has tried enemas and other measures without success. Pt has DNR paperwork hospice notebook with him

## 2021-03-08 NOTE — Discharge Instructions (Addendum)
You are very constipated.  Please stay hydrated.  Please also take potassium 40 mEq daily to help you with constipation.  You also need to take MiraLAX twice daily for at least a week to help you with constipation  Your salt level is also low.  Please talk to your hospice care about repeating lab work   Muscle talk to your home health nurse to check first bowel movement.  If he still has no bowel movement in the next few days, he may need a repeat rectal exam and may need to be disimpacted again.  Return to ER if he has severe abdominal pain or distention, vomiting, dehydration, fever.

## 2021-03-08 NOTE — ED Notes (Signed)
Holding enema and mag citrate until after DG ABD; pt comfortable at this time and pt wife agrees with plan to hold

## 2021-03-08 NOTE — ED Notes (Signed)
Per EDP, pt is discharged and [facility] called. Pt currently on first bag of potassium, EDP states that whenever [facility] arrives, whatever potassium is running to be discontinued.

## 2021-03-08 NOTE — ED Notes (Signed)
ED Provider at bedside upon arrival to assess pt prior to triage completion. Large amount of black stool removed from rectum by EDP

## 2021-03-08 NOTE — ED Notes (Signed)
Family updated as to patient's status. Wife at bedside. DNR and care notebook given to her

## 2021-03-08 NOTE — ED Provider Notes (Signed)
[facility] EMERGENCY DEPARTMENT Provider Note   CSN: [id number] Arrival date & time: 03/08/21  1858     History Chief Complaint  Patient presents with   Constipation    Todd Romero is a 87 y.o. male history of CAD, CHF on midodrine, aortic valve replacement on Coumadin, here presenting with constipation.  Patient is already on palliative care since he is end-stage CHF.  Patient has no bowel movement for the last 2 weeks.  He does have home health nurse that came out and tried to do enemas and disimpaction but were unsuccessful.  He has been having progressively worsening abdominal distention and vomiting so was brought to the ED for evaluation.  I had extensive discussion about CODE STATUS with his wife.  She states that she does not want any aggressive treatment and just wants him treated for the constipation.  The history is provided by the patient and the spouse.      Past Medical History:  Diagnosis Date   AKI (acute kidney injury) ([place]) 10/24/2019   Anemia    Arthritis    CAD (coronary artery disease)    a. cardiac cath 12/2016 showing moderate AS, elevated LVEDP, heavy 3V coronary calcification, 100% mD2, 30-50% LAD, 50-90% stenosis of Cx proximal to origin of L-PDA, RCA not engaged due to poor catheter control (difficult procedure) - coronary status was essentially unchanged from prior.   Chicken pox    Chronic diastolic CHF (congestive heart failure) (HCC)    Colon polyps    Degenerative arthritis    Diabetes ([place])    Diabetes mellitus without complication (HCC)    Dyspnea    Dyspnea on exertion 02/11/2015   Essential hypertension    Heart disease    Heme positive stool 11/16/2017   Hyperlipidemia    Intermediate stage nonexudative age-related macular degeneration of both eyes 02/27/2015   Leukocytosis 11/16/2017   New onset a-fib ([place]) 03/04/2018   Obesity (BMI 30-39.9) 06/18/2015   OSA (obstructive sleep apnea)    Severe with AHI 27/hr now on CPAP  not  compliant with treatment.   Peritonitis ([place]) 1985?   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 s/p TAVR 03/2017.   Symptomatic bradycardia 10/24/2019   Type II or unspecified type diabetes mellitus without mention of complication, not stated as uncontrolled    Varicose vein of leg     Patient Active Problem List   Diagnosis Date Noted   Acute on chronic systolic heart failure, NYHA class 3 ([place]) 01/21/2021   Gastroesophageal reflux disease 11/05/2020   Morbid obesity ([place]) 11/05/2020   Anxiety 10/22/2020   Generalized weakness 10/21/2020   At high risk for falls 10/21/2020   Tremor 10/18/2020   Acute on chronic diastolic HF (heart failure) ([place]) 10/01/2020   Thyroid nodule greater than or equal to 1 cm in diameter incidentally noted on imaging study 09/01/2020   Spondylosis, cervical 09/01/2020   Sleep disturbance 04/23/2020   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   Pacemaker 11/08/2019   Chronic anticoagulation 11/08/2019   Benign prostatic  hyperplasia 10/01/2019   Stage 3b chronic kidney disease ([place]) 10/01/2019   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   Chronic combined systolic and diastolic CHF (congestive heart failure) ([place]) 03/27/2018   Persistent atrial fibrillation ([place]) 03/04/2018   GIB (gastrointestinal bleeding) 11/18/2017   Anemia 11/16/2017   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   Aortic stenosis 12/18/2015   Obesity (BMI 30-39.9) 06/18/2015   Obstructive sleep apnea 02/11/2015   Primary osteoarthritis of right knee 03/18/2014   Essential  hypertension, benign 06/21/2013    Class: Chronic   RBBB    CAD (coronary artery disease)     Class: Chronic   Hyperlipidemia    Type 2 diabetes mellitus ([place]) 05/02/2013   Retinal tear, left 11/16/2011    Past Surgical History:  Procedure Laterality Date   APPENDECTOMY  1985   peritonitis   BIV UPGRADE N/A 12/01/2020   Procedure: BIV UPGRADE;  Surgeon: Evans Lance, MD;  Location: [facility] CV LAB;  Service: Cardiovascular;  Laterality: N/A;   CARDIAC SURGERY     CARDIOVERSION N/A 04/03/2018   Procedure: CARDIOVERSION;  Surgeon: Josue Hector, MD;  Location: [facility] ENDOSCOPY;  Service: Cardiovascular;  Laterality: N/A;   CARDIOVERSION N/A 01/18/2019   Procedure: CARDIOVERSION;  Surgeon: Skeet Latch, MD;  Location[facility] ENDOSCOPY;  Service: Cardiovascular;  Laterality: N/A;   CARDIOVERSION N/A 10/07/2020   Procedure: CARDIOVERSION;  Surgeon: Larey Dresser, MD;  Location: [facility] ENDOSCOPY;  Service: Cardiovascular;  Laterality: N/A;   CARPAL TUNNEL RELEASE     COLONOSCOPY WITH PROPOFOL N/A 11/18/2017   Procedure: COLONOSCOPY WITH PROPOFOL;  Surgeon: Carol Ada, MD;  Location: [facility] ENDOSCOPY;  Service: Endoscopy;  Laterality: N/A;   ESOPHAGOGASTRODUODENOSCOPY N/A 11/18/2017   Procedure: ESOPHAGOGASTRODUODENOSCOPY (EGD);  Surgeon: Carol Ada, MD;  Location: [facility] ENDOSCOPY;  Service: Endoscopy;  Laterality: N/A;   EYE SURGERY Bilateral    cataracts removed, lens placed   IR FLUORO GUIDE CV LINE RIGHT  01/28/2021   IR US GUIDE VASC ACCESS RIGHT  01/28/2021   JOINT REPLACEMENT     KNEE CARTILAGE SURGERY     KNEE SURGERY Right    PACEMAKER IMPLANT N/A 10/25/2019   Procedure: PACEMAKER IMPLANT;  Surgeon: Evans Lance, MD;  Location: [place] [facility] CV LAB;  Service: Cardiovascular;  Laterality: N/A;   PACEMAKER IMPLANT     PARTIAL COLECTOMY     RIGHT/LEFT HEART CATH AND CORONARY ANGIOGRAPHY N/A 12/28/2016   Procedure: Right/Left Heart Cath and Coronary Angiography;  Surgeon: Belva Crome, MD;  Location: [facility] CV LAB;  Service: Cardiovascular;  Laterality: N/A;   TEE WITHOUT CARDIOVERSION N/A 03/15/2017   Procedure: TRANSESOPHAGEAL ECHOCARDIOGRAM (TEE);  Surgeon: Burnell Blanks, MD;  Location: [facility];  Service: Open Heart Surgery;  Laterality: N/A;   TEE WITHOUT CARDIOVERSION N/A 10/07/2020   Procedure: TRANSESOPHAGEAL ECHOCARDIOGRAM (TEE);  Surgeon: Larey Dresser, MD;  Location: [facility] ENDOSCOPY;  Service: Cardiovascular;  Laterality: N/A;   TONSILLECTOMY AND ADENOIDECTOMY  1942   TOTAL KNEE ARTHROPLASTY Right 11/08/2017   TOTAL KNEE ARTHROPLASTY Right 11/08/2017   Procedure: TOTAL KNEE ARTHROPLASTY;  Surgeon: Melrose Nakayama, MD;  Location: [facility];  Service: Orthopedics;  Laterality: Right;   TRANSCATHETER AORTIC VALVE REPLACEMENT, TRANSFEMORAL N/A 03/15/2017   Procedure: TRANSCATHETER AORTIC VALVE REPLACEMENT, TRANSFEMORAL;  Surgeon: Burnell Blanks, MD;  Location: [facility];  Service: Open Heart Surgery;  Laterality: N/A;   VEIN SURGERY  1980       Family History  Problem Relation Age of Onset  Heart failure Father    Emphysema Father    COPD Father    Early death Father    Heart attack Father    Hypertension Mother    Early death Mother    Hyperlipidemia Mother    Stroke Mother    Cancer Sister    Hypertension Sister    Healthy Sister     Social History   Tobacco Use   Smoking status: Never   Smokeless tobacco: Never  Vaping Use   Vaping Use: Never used  Substance Use Topics   Alcohol use: Not Currently   Drug use: Not Currently    Home Medications Prior to Admission medications   Medication Sig Start Date End Date Taking? Authorizing Provider  ACCU-CHEK AVIVA PLUS test strip USE AS INSTRUCTED TO CHECK BLOOD SUGAR TWICE A DAY DX:E11.65 10/21/20   [name], MD  acetaminophen (TYLENOL) 500 MG tablet Take 500 mg by mouth every 4 (four) hours as needed for moderate pain or headache.    [provider]  amiodarone (PACERONE)  200 MG tablet Take 1 tablet (200 mg total) by mouth daily. 10/13/20   Lyda Jester M, PA-C  apixaban (ELIQUIS) 2.5 MG TABS tablet Take 1 tablet (2.5 mg total) by mouth 2 (two) times daily. 09/15/20   Belva Crome, MD  B-D UF III MINI PEN NEEDLES 31G X 5 MM MISC USE 2 PEN NEEDLE PER DAY WITH HUMALOG AND VICTOZA 06/24/20   [name], MD  BD INSULIN SYRINGE U/F 31G X 5/16" 1 ML MISC USE TO INJECT INSULIN DAILY. DX:E11.9 12/08/20   [name], MD  citalopram (CELEXA) 10 MG tablet Take 1 tablet (10 mg total) by mouth daily. 01/30/21   Lyda Jester M, PA-C  ferrous sulfate 325 (65 FE) MG tablet Take 325 mg by mouth 2 (two) times daily with a meal.     [provider]  glimepiride (AMARYL) 2 MG tablet TAKE 1 TABLET DAILY BEFORE BREAKFAST Patient taking differently: Take 2 mg by mouth daily with breakfast. 07/03/20   [name], MD  HUMALOG KWIKPEN 100 UNIT/ML KwikPen INJECT 12 UNITS INTO THE SKIN 2 (TWO) TIMES DAILY BEFORE LUNCH AND SUPPER. INJECT 12 UNITS UNDER SKIN BEFORE LUNCH AND DINNER. MAY ALSO INJECT 4 UNIT AT BREAKFAST IS SUGAR READING IS OVER 100. Patient taking differently: Inject 12 Units into the skin See admin instructions. Inject 12 units into the skin 2 times daily before lunch and supper. May also inject 4 units at breakfast if reading is over 100 11/11/20   [name], MD  insulin NPH Human (HUMULIN N) 100 UNIT/ML injection Inject 26 units of Humulin N under the skin once daily at bedtime. Patient taking differently: Inject 26 units of Humulin N under the skin once daily at bedtime. 01/28/20   [name], MD  liraglutide (VICTOZA) 18 MG/3ML SOPN Inject 1.8 mg daily 09/09/20   [name], MD  metolazone (ZAROXOLYN) 2.5 MG tablet Take 1 tablet (2.5 mg total) by mouth as needed. As directed by the [facility] 01/29/21   Lyda Jester M, PA-C  milrinone [facility]) 20 MG/100 ML SOLN infusion Inject 0.0253 mg/min into the vein continuous. 01/29/21   Consuelo Pandy, PA-C  Multiple Vitamin (MULTIVITAMIN WITH MINERALS) TABS tablet Take 1 tablet by mouth daily. Mens One a day Vit    [provider]  Multiple Vitamins-Minerals (PRESERVISION AREDS 2 PO) Take 1 capsule by mouth 2 (two) times daily.    [provider]  ondansetron (ZOFRAN) 4 MG tablet Take 1 tablet (4 mg total) by mouth every 8 (eight) hours as needed for nausea or vomiting. 02/04/21   Kuneff, Renee A, DO  pantoprazole (PROTONIX) 40 MG tablet TAKE 1 TABLET TWICE A DAY Patient taking differently: Take 40 mg by mouth 2 (two) times daily. 12/30/20   Belva Crome, MD  potassium chloride SA (KLOR-CON) 20 MEQ tablet Take 2 tablets (40 mEq total) by mouth daily. 01/29/21   Lyda Jester M, PA-C  pravastatin (PRAVACHOL) 80 MG tablet TAKE 1 TABLET BY MOUTH AT BEDTIME FOR HYPERLIPIDEMIA Patient taking differently: Take 80 mg by mouth daily. 01/14/21   Larey Dresser, MD  tamsulosin (FLOMAX) 0.4 MG CAPS capsule Take 1 capsule daily Patient taking differently: Take 0.4 mg by mouth daily. 06/30/20   Kuneff, Renee A, DO  torsemide (DEMADEX) 20 MG tablet Take 5 tablets (100 mg total) by mouth 2 (two) times daily. 01/13/21   Larey Dresser, MD    Allergies    Ambien [zolpidem tartrate], Other, Bactrim [sulfamethoxazole-trimethoprim], Bactrim [sulfamethoxazole-trimethoprim], Morphine and related, and Morphine and related  Review of Systems   Review of Systems  Gastrointestinal:  Positive for constipation and vomiting.  All other systems reviewed and are negative.  Physical Exam Updated Vital Signs BP 120/62   Pulse 77   Ht 5\' 7"  (1.702 m)   Wt 88.9 kg Comment: bed scale  SpO2 96%   BMI 30.70 kg/m   Physical Exam Vitals and nursing note reviewed.  Constitutional:      Comments: Chronically ill-appearing  HENT:     Head: Normocephalic.     Nose: Nose normal.     Mouth/Throat:     Mouth: Mucous membranes are dry.  Eyes:     Extraocular Movements: Extraocular  movements intact.     Pupils: Pupils are equal, round, and reactive to light.  Cardiovascular:     Rate and Rhythm: Normal rate and regular rhythm.     Pulses: Normal pulses.     Heart sounds: Normal heart sounds.  Pulmonary:     Effort: Pulmonary effort is normal.     Breath sounds: Normal breath sounds.  Abdominal:     Comments: Distended  Genitourinary:    Comments: Rectal-black stools (patient on iron), there is obvious stool impaction Musculoskeletal:        General: Normal range of motion.     Cervical back: Normal range of motion and neck supple.  Skin:    General: Skin is warm.     Capillary Refill: Capillary refill takes less than 2 seconds.  Neurological:     General: No focal deficit present.     Mental Status: He is oriented to person, place, and time.  Psychiatric:        Mood and Affect: Mood normal.        Behavior: Behavior normal.    ED Results / Procedures / Treatments   Labs (all labs ordered are listed, but only abnormal results are displayed) Labs Reviewed  CBC WITH DIFFERENTIAL/PLATELET - Abnormal; Notable for the following components:      Result Value   WBC 12.2 (*)    Hemoglobin 12.5 (*)    HCT 36.2 (*)    Neutro Abs 10.1 (*)    Monocytes Absolute 1.2 (*)    All other components within normal limits  COMPREHENSIVE METABOLIC PANEL - Abnormal; Notable for the following components:   Sodium 121 (*)    Potassium 2.9 (*)  Chloride 85 (*)    Glucose, Bld 182 (*)    BUN 28 (*)    Total Protein 6.4 (*)    Albumin 3.1 (*)    All other components within normal limits    EKG None  Radiology DG Abd Portable 1 View  Result Date: 03/08/2021 CLINICAL DATA:  Constipation for 2 weeks. EXAM: PORTABLE ABDOMEN - 1 VIEW COMPARISON:  None. FINDINGS: Mildly distended gas-filled colon. No small bowel distention. Changes most likely to represent ileus. No radiopaque stones. Degenerative changes in the spine. Vascular calcifications. IMPRESSION: Mildly  distended gas-filled colon diffusely. No small bowel distention. Changes likely to represent ileus. Electronically Signed   By: Lucienne Capers M.D.   On: 03/08/2021 20:25    Procedures Fecal disimpaction  Date/Time: 03/08/2021 8:51 PM Performed by: Drenda Freeze, MD Authorized by: Drenda Freeze, MD  Consent: Verbal consent obtained. Risks and benefits: risks, benefits and alternatives were discussed Consent given by: patient Patient understanding: patient states understanding of the procedure being performed Patient consent: the patient's understanding of the procedure matches consent given Patient identity confirmed: verbally with patient Local anesthesia used: no  Anesthesia: Local anesthesia used: no  Sedation: Patient sedated: no  Patient tolerance: patient tolerated the procedure well with no immediate complications       Medications Ordered in ED Medications  magnesium citrate solution 1 Bottle (has no administration in time range)  potassium chloride 10 mEq in 100 mL IVPB (has no administration in time range)    ED Course  I have reviewed the triage vital signs and the nursing notes.  Pertinent labs & imaging results that were available during my care of the patient were reviewed by me and considered in my medical decision making (see chart for details).    MDM Rules/Calculators/A&P                         Amy Belloso Puleo is a 87 y.o. male here with constipation abdominal distention.  I think likely ileus.  Low suspicion for SBO.  Will check basic labs and also get x-rays.  On rectal exam, he has stool impaction and I was able to perform manual disimpaction  9 pm After the first manual disimpaction, we tried soapsuds enema.  There is still a lot of stool.  I disimpacted him a second time.   10:35 PM Patient had a second soapsuds enema with some stools that came out.  CT scan performed and showed constipation with no bowel obstruction.  Patient's  potassium is 2.9 and was supplemented.  Had extensive discussion with family and patient.  Patient is on hospice already.  His potassium was discontinued previously.  He also does not get blood draws anymore.  His sodium level is 121 his potassium is 2.9.  I offered admission for hydration and treatment of constipation but they really want to go home.  I think it is reasonable since he is hospice already.  I would recommend at this point to take MiraLAX twice daily.  He should also be on potassium supplementation to help him with constipation.  He will need to follow-up with hospice.  He has home health nurse that can come and check on him and see if he needs another disimpaction in the few days if the bowel regimen does not work.  Final Clinical Impression(s) / ED Diagnoses Final diagnoses:  None    Rx / DC Orders ED Discharge Orders  None        Drenda Freeze, MD 03/08/21 2237

## 2021-03-08 NOTE — ED Notes (Signed)
This RN placed pt on 5-lead due to potassium administration

## 2021-03-08 NOTE — ED Notes (Addendum)
After first soap suds enema, second RN felt impaction so this RN got EDP; EDP performed a second disimpaction and verbally ordered a second soaps suds enema; second soap suds performed by two RNs

## 2021-03-09 NOTE — ED Notes (Signed)
Pt wife decided to leave, asked for him to be changed and for IV to be removed; this RN will call wife when [facility] arrives; pericare performed and warm blankets given with NT

## 2021-03-09 NOTE — ED Notes (Signed)
[facility] here to transport pt; this RN contacted pt wife per request and informed of transport arrival

## 2021-03-09 NOTE — ED Notes (Signed)
Pt still waiting for [facility]; asked pt and wife if I could get them anything or if pt needed to be cleaned up; pt declined, warm blanket given to wife

## 2021-03-11 ENCOUNTER — Other Ambulatory Visit: Payer: Self-pay | Admitting: Student

## 2021-03-12 ENCOUNTER — Encounter: Payer: Medicare Other | Admitting: Internal Medicine

## 2021-03-12 ENCOUNTER — Telehealth: Payer: Self-pay | Admitting: Internal Medicine

## 2021-03-12 ENCOUNTER — Other Ambulatory Visit: Payer: Self-pay | Admitting: Interventional Cardiology

## 2021-03-12 NOTE — Telephone Encounter (Signed)
    Pt's spouse calling, she said pt was taken to the hospital this weekend and was brought in to hospice care, she cancelled pt's appt today for 91 day f/u with Dr. Lovena Le

## 2021-03-12 NOTE — Telephone Encounter (Signed)
Prescription refill request for Eliquis received. Indication: Atrial fib Last office visit: 01/13/21 Einar Crow MD Scr: 1.19 on 03/08/21 Age: 87 Weight: 102.7kg  Ist Normal SCr in months.  Has been over 2.0 for month.  Will continue Eliquis 2.5mg  twice daily and review SCr in 3 months.

## 2021-03-27 ENCOUNTER — Other Ambulatory Visit: Payer: Self-pay | Admitting: Cardiology

## 2021-04-06 ENCOUNTER — Telehealth (HOSPITAL_COMMUNITY): Payer: Self-pay | Admitting: *Deleted

## 2021-04-06 NOTE — Telephone Encounter (Signed)
Ok to stop milrinone with the understanding that he will likely deteriorate faster.

## 2021-04-06 NOTE — Telephone Encounter (Signed)
Hospice aware and agreeable with plan.

## 2021-04-06 NOTE — Telephone Encounter (Signed)
Kayla a nurse with [facility] called stating pts wife wanted to stop milrinone. Hospice will need a verbal to discontinue. Call back # [phone]  Routed to [facility] for advice

## 2021-04-08 ENCOUNTER — Telehealth (HOSPITAL_COMMUNITY): Payer: Self-pay

## 2021-04-08 NOTE — Telephone Encounter (Signed)
Caryl Pina with [facility] called to report that patient passed away this morning. If you have any questions she can be reached at  [phone]

## 2021-05-07 DEATH — deceased

## 2021-08-08 IMAGING — DX DG CHEST 1V PORT
1 series · 2 of 2 positions shown · non-contrast
Comparison: [DATE]/[id number] 1871 hours

CLINICAL DATA: PICC line placement

EXAM:
PORTABLE CHEST 1 VIEW

[Series 1: chest ap · 0.14mm/px · 2 of 2 slices shown]
[im 1/2]
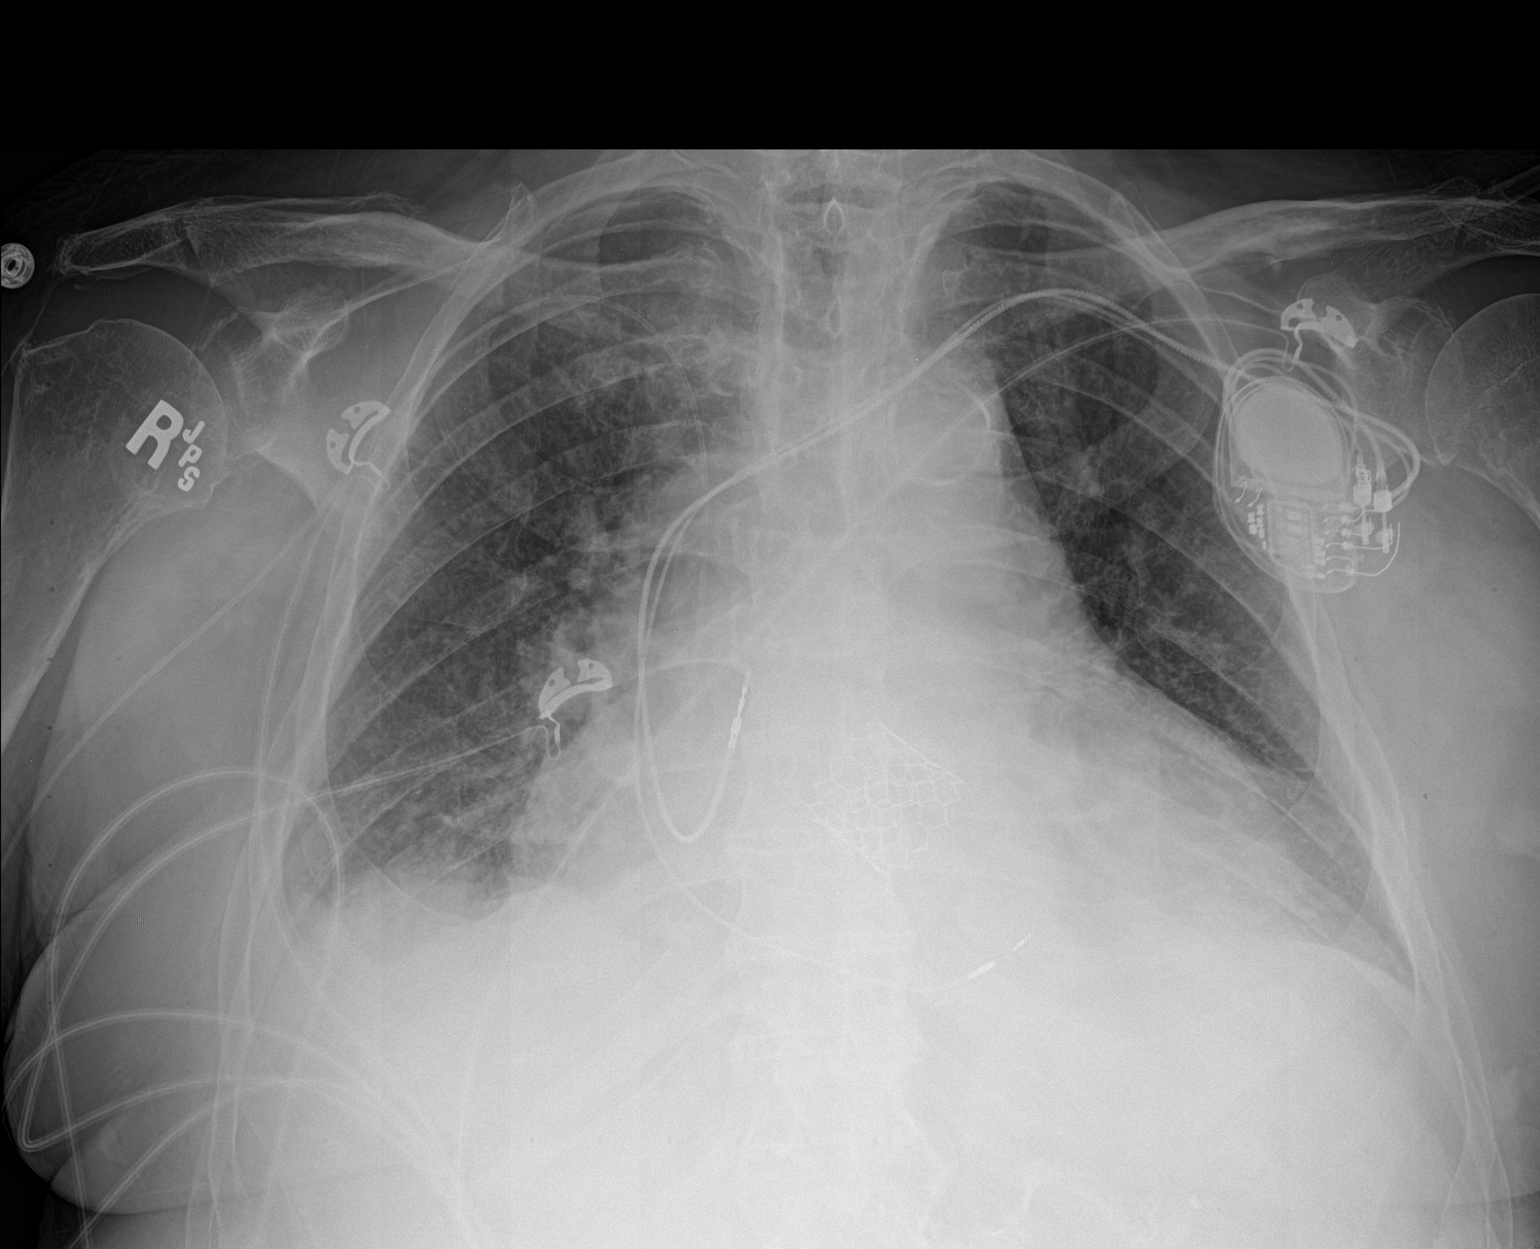
[im 2/2]
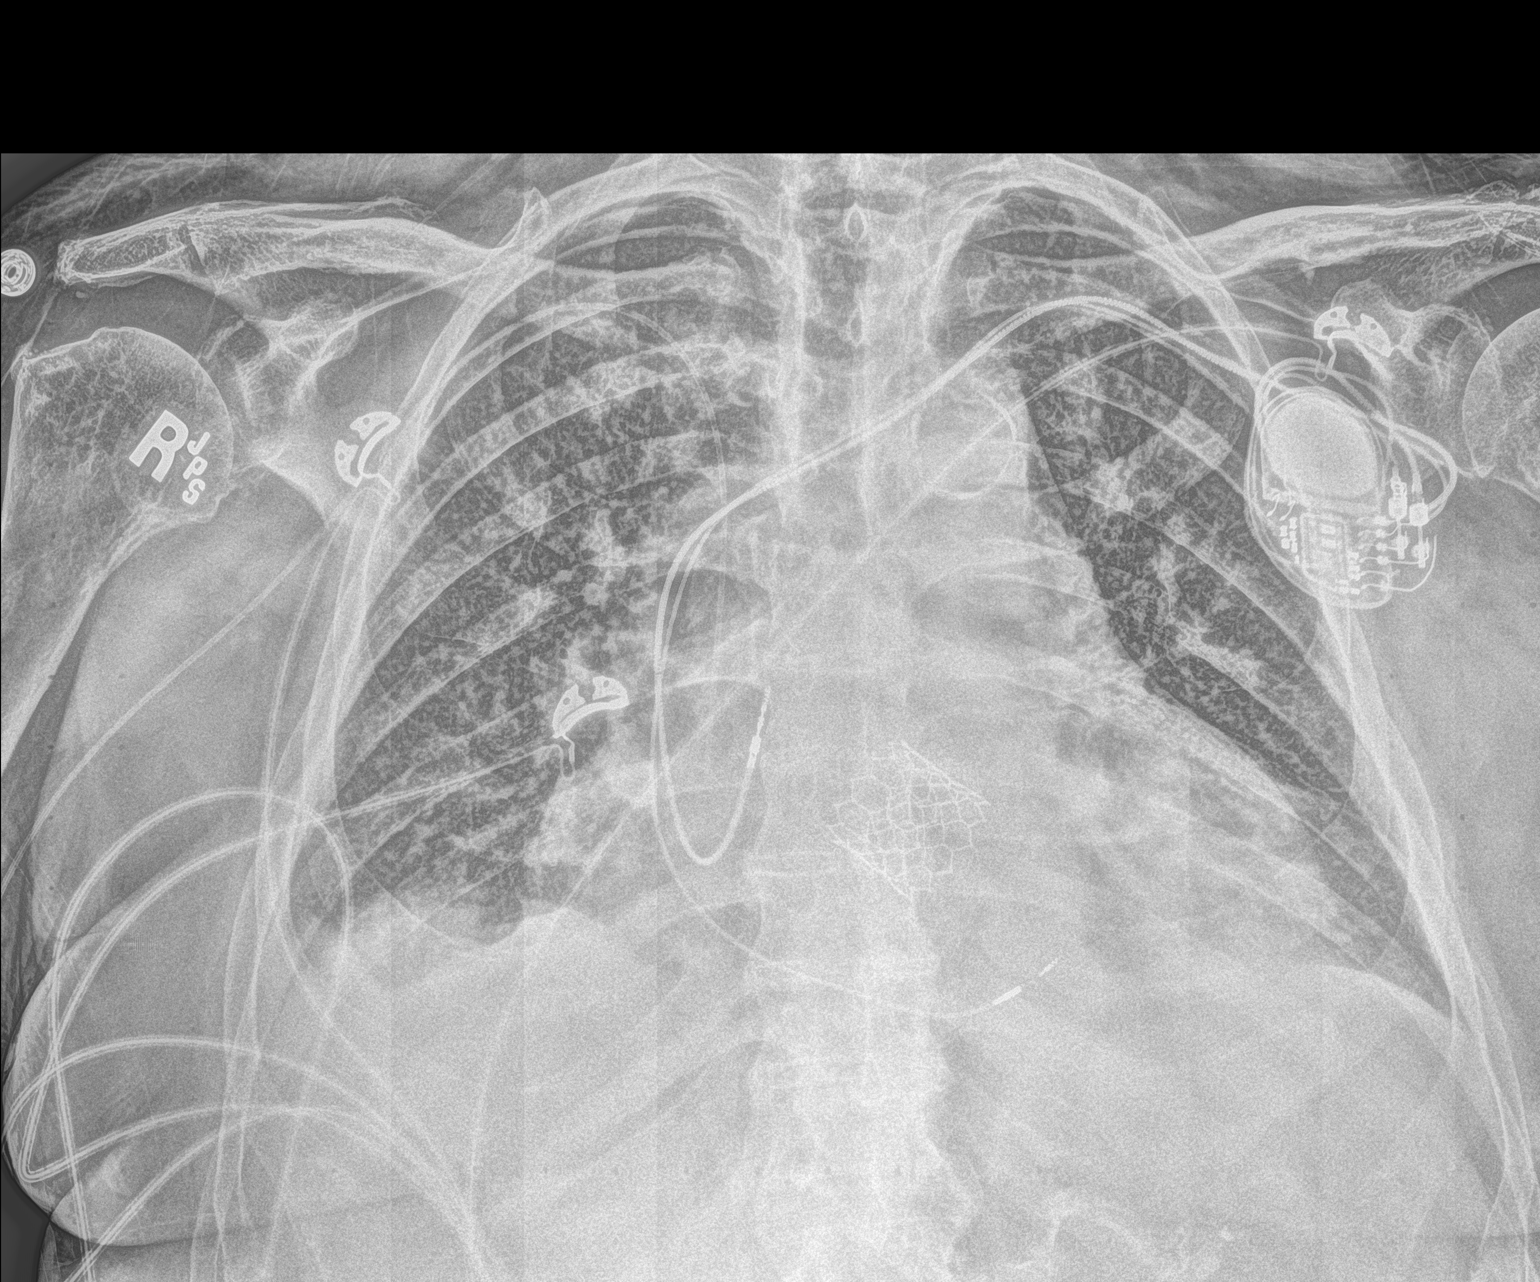

[2 of 2 positions shown; findings below may reference images not displayed]

FINDINGS: Interval placement of right upper extremity PICC line with distal
tip terminating level of the proximal right atrium. The left-sided
implanted cardiac device. Aortic valve replacement. Stable
cardiomegaly. Atherosclerotic calcification of the aortic knob. Mild
bibasilar atelectasis. No pneumothorax.
IMPRESSION: Interval placement of right upper extremity PICC line with distal
tip terminating at the level of the proximal right atrium. No
pneumothorax.

## 2022-01-08 IMAGING — CT CT ABD-PELV W/O CM
2 of 4 series · 15 of 46 positions shown, 17 images · non-contrast
Comparison: CTA abdomen/pelvis dated 02/16/2017

CLINICAL DATA: Abdominal distension, constipation, hospice care

EXAM:
CT ABDOMEN AND PELVIS WITHOUT CONTRAST
TECHNIQUE: Multidetector CT imaging of the abdomen and pelvis was performed
following the standard protocol without IV contrast.

[Series 2: axial st · axial · 0.86mm/px · z∈[+234,+644]mm · 12 of 92 slices shown, 14 images]
[im 5/92  soft-tissue]
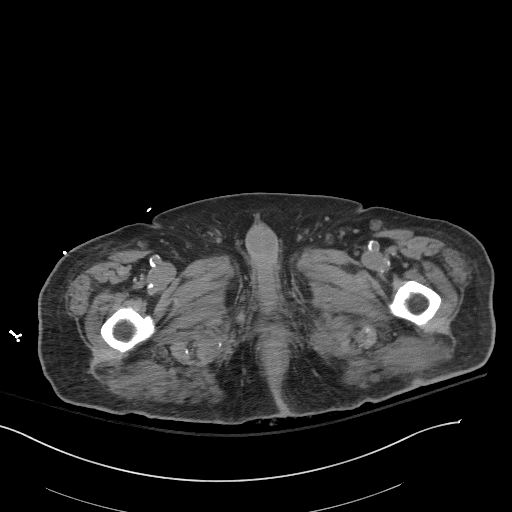
[im 5/92  bone]
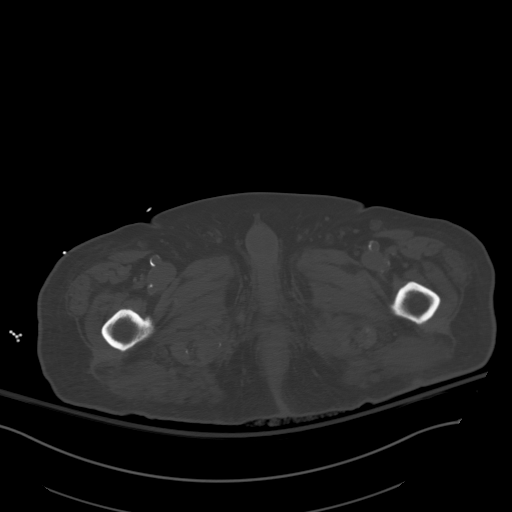
[im 14/92  soft-tissue]
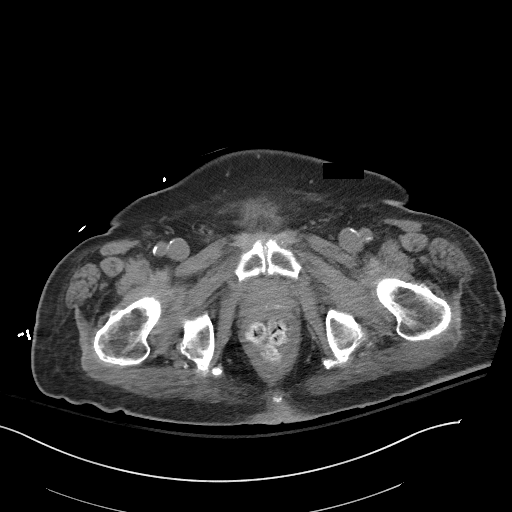
[im 22/92  soft-tissue]
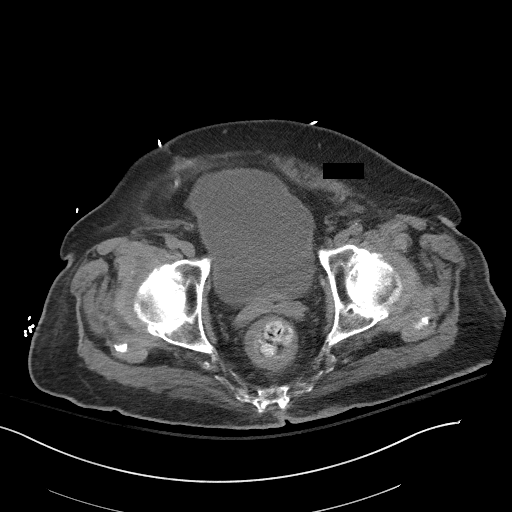
[im 27/92  soft-tissue]
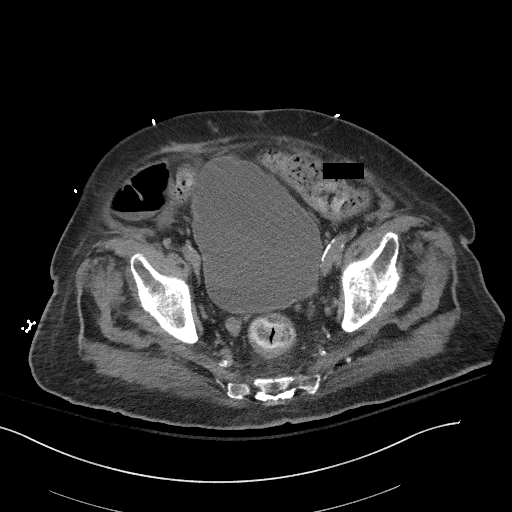
[im 35/92  soft-tissue]
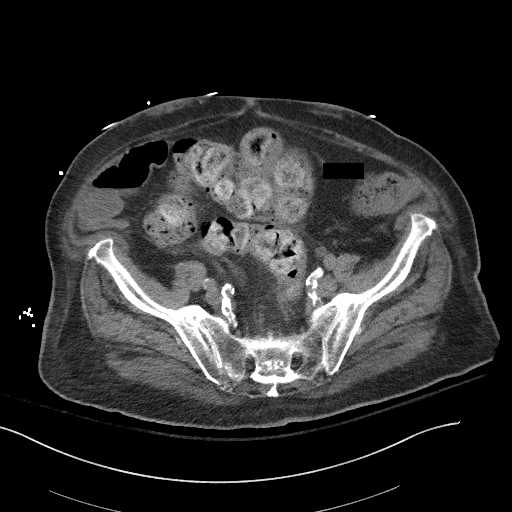
[im 44/92  soft-tissue]
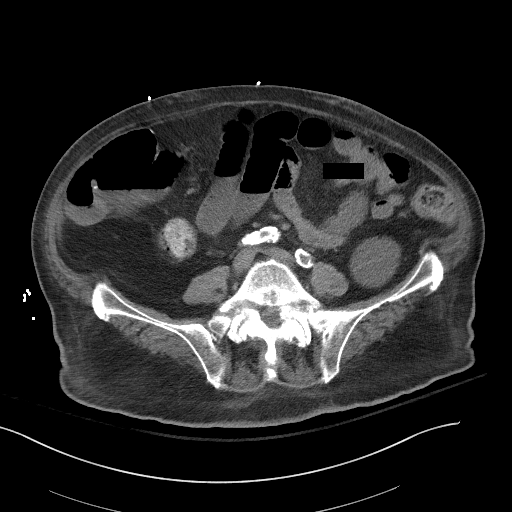
[im 48/92  soft-tissue]
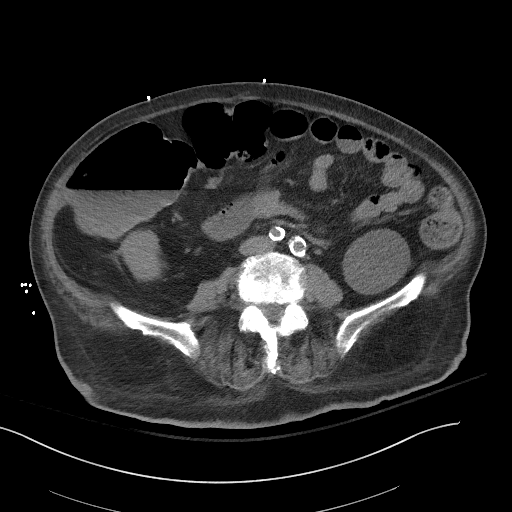
[im 57/92  soft-tissue]
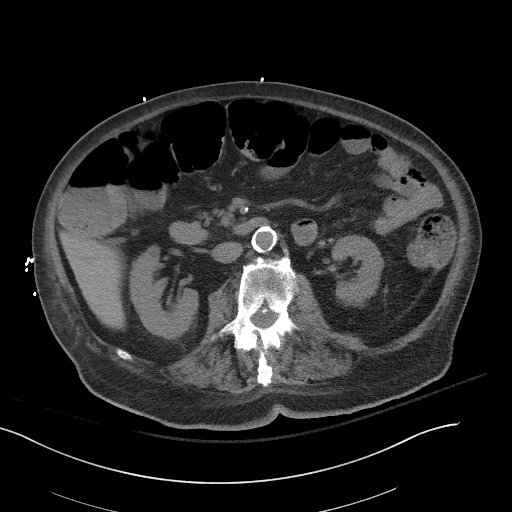
[im 66/92  soft-tissue]
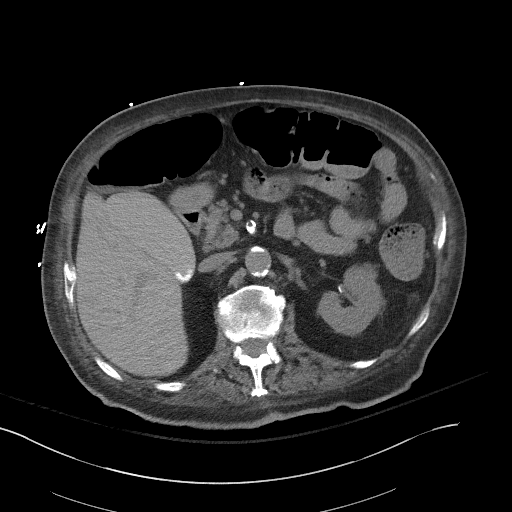
[im 66/92  bone]
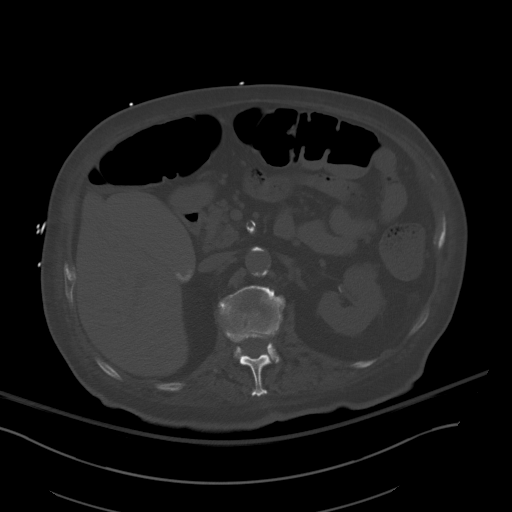
[im 70/92  soft-tissue]
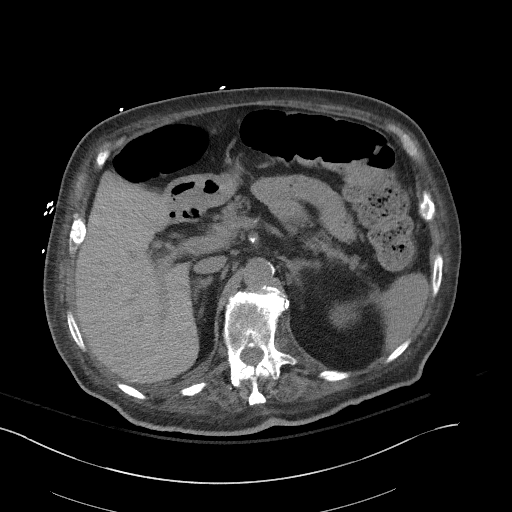
[im 79/92  soft-tissue]
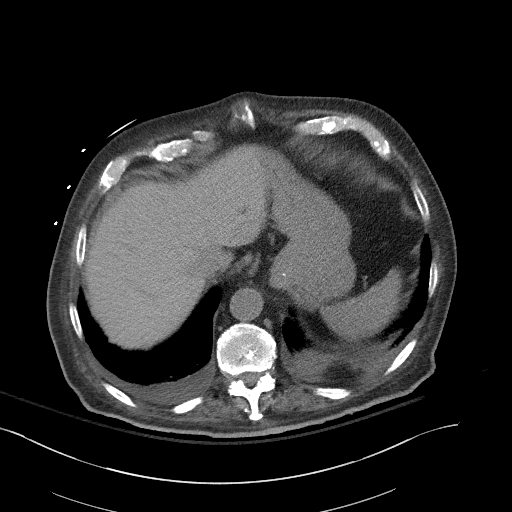
[im 87/92  soft-tissue]
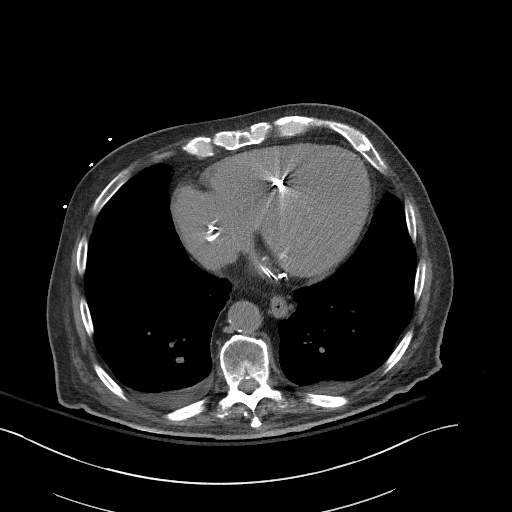

[Series 5: coronal st · coronal · 0.46mm/px · 3 of 101 slices shown]
[im 34/101  soft-tissue]
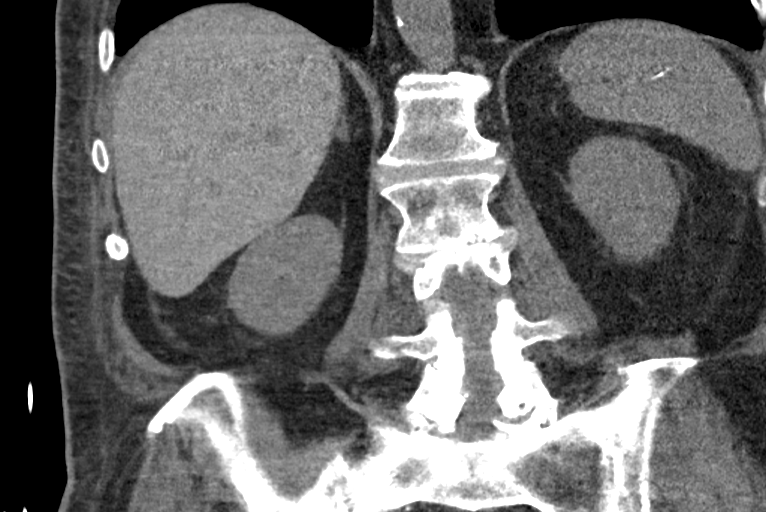
[im 45/101  soft-tissue]
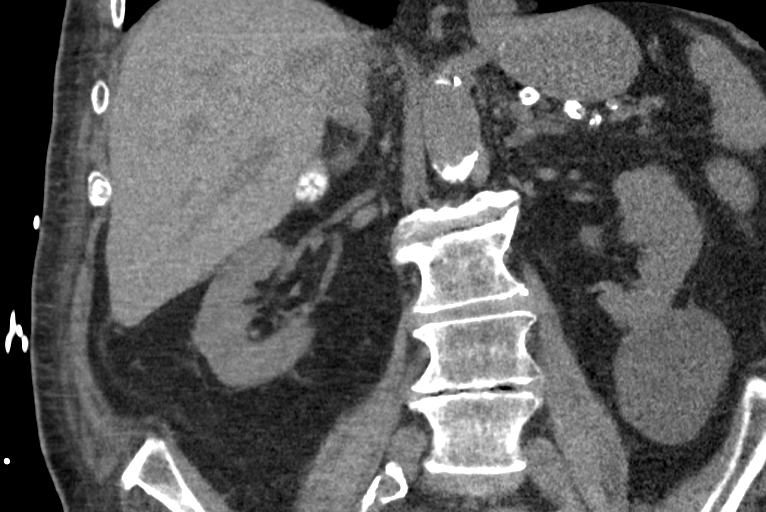
[im 56/101  soft-tissue]
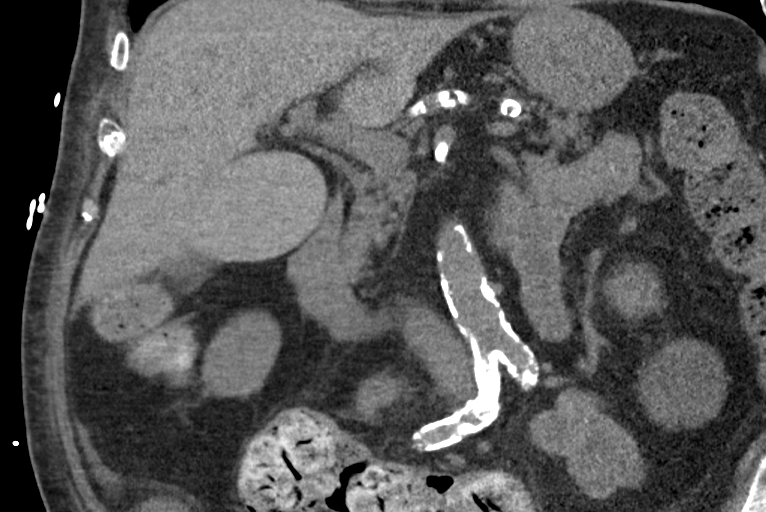

[15 of 46 positions shown; findings below may reference images not displayed]

FINDINGS: Lower chest: Small bilateral pleural effusions. Prosthetic aortic
valve. Pacemaker leads.

Hepatobiliary: Two probable cysts measuring up to 8 mm (series
2/image 22). Unenhanced liver is otherwise unremarkable.

Layering small gallstones (series 2/image 27), without associated
inflammatory changes. No intrahepatic or extrahepatic ductal
dilatation.

Pancreas: Within normal limits.

Spleen: Within normal limits.

Adrenals/Urinary Tract: Adrenal glands are within normal limits.

6.0 cm left lower pole renal cyst (series 2/image 44), previously
3.4 cm, benign. Right kidney is within normal limits. No renal
calculi or hydronephrosis.

Bladder is within normal limits.

Stomach/Bowel: Stomach is notable for a tiny hiatal hernia.

No evidence of bowel obstruction.

Postsurgical changes involving the right colon, favoring an
ileocecal resection. Appendix is not discretely visualized.

Moderate rectosigmoid colonic stool burden, suggesting mild
constipation. Air-filled ascending and transverse colon. No
associated colonic wall thickening or inflammatory changes.

Vascular/Lymphatic: No evidence of abdominal aortic aneurysm.

Atherosclerotic calcifications of the abdominal aorta and branch
vessels.

No suspicious abdominopelvic lymphadenopathy.

Reproductive: Prostate is unremarkable.

Other: No abdominopelvic ascites.

Tiny fat containing periumbilical hernia (series 2/image 60).

Subcutaneous injection sites along the anterior abdominal wall
(series 2/image 54).

Musculoskeletal: Degenerative changes of the visualized
thoracolumbar spine.
IMPRESSION: Moderate rectosigmoid colonic stool burden, suggesting mild
constipation. No associated colonic wall thickening or inflammatory
changes.

Cholelithiasis, without associated inflammatory changes.

Small bilateral pleural effusions.

Additional ancillary findings as above.
